# Patient Record
Sex: Male | Born: 1960 | Race: White | Hispanic: No | Marital: Single | Smoking: Current every day smoker
Health system: Southern US, Community
[De-identification: ages and names within clinical notes are randomized; demographics above are authoritative.]

## PROBLEM LIST (undated history)

## (undated) ENCOUNTER — Emergency Department (HOSPITAL_COMMUNITY): Admission: EM | Source: Home / Self Care

## (undated) DIAGNOSIS — E119 Type 2 diabetes mellitus without complications: Secondary | ICD-10-CM

## (undated) DIAGNOSIS — F419 Anxiety disorder, unspecified: Secondary | ICD-10-CM

## (undated) DIAGNOSIS — M869 Osteomyelitis, unspecified: Secondary | ICD-10-CM

## (undated) DIAGNOSIS — I639 Cerebral infarction, unspecified: Secondary | ICD-10-CM

## (undated) DIAGNOSIS — K7460 Unspecified cirrhosis of liver: Secondary | ICD-10-CM

## (undated) DIAGNOSIS — I10 Essential (primary) hypertension: Secondary | ICD-10-CM

## (undated) DIAGNOSIS — I480 Paroxysmal atrial fibrillation: Secondary | ICD-10-CM

## (undated) DIAGNOSIS — F319 Bipolar disorder, unspecified: Secondary | ICD-10-CM

## (undated) HISTORY — PX: BELOW KNEE LEG AMPUTATION: SUR23

## (undated) HISTORY — DX: Osteomyelitis, unspecified: M86.9

---

## 1998-03-06 ENCOUNTER — Emergency Department (HOSPITAL_COMMUNITY): Admission: EM | Admit: 1998-03-06 | Discharge: 1998-03-06 | Payer: Self-pay | Admitting: Emergency Medicine

## 1999-06-27 ENCOUNTER — Emergency Department (HOSPITAL_COMMUNITY): Admission: EM | Admit: 1999-06-27 | Discharge: 1999-06-27 | Payer: Self-pay | Admitting: Emergency Medicine

## 1999-06-27 ENCOUNTER — Encounter: Payer: Self-pay | Admitting: Emergency Medicine

## 2018-05-07 ENCOUNTER — Emergency Department (HOSPITAL_COMMUNITY): Payer: Medicare Other

## 2018-05-07 ENCOUNTER — Inpatient Hospital Stay (HOSPITAL_COMMUNITY): Payer: Medicare Other

## 2018-05-07 ENCOUNTER — Encounter (HOSPITAL_COMMUNITY): Payer: Self-pay | Admitting: Emergency Medicine

## 2018-05-07 ENCOUNTER — Inpatient Hospital Stay (HOSPITAL_COMMUNITY)
Admission: EM | Admit: 2018-05-07 | Discharge: 2018-05-10 | DRG: 565 | Disposition: A | Discharge status: Skilled Nursing Facility | Payer: Medicare Other | Attending: Family Medicine | Admitting: Family Medicine

## 2018-05-07 ENCOUNTER — Other Ambulatory Visit: Payer: Self-pay

## 2018-05-07 DIAGNOSIS — L97519 Non-pressure chronic ulcer of other part of right foot with unspecified severity: Secondary | ICD-10-CM | POA: Diagnosis not present

## 2018-05-07 DIAGNOSIS — L89899 Pressure ulcer of other site, unspecified stage: Secondary | ICD-10-CM | POA: Diagnosis present

## 2018-05-07 DIAGNOSIS — L03115 Cellulitis of right lower limb: Secondary | ICD-10-CM | POA: Diagnosis present

## 2018-05-07 DIAGNOSIS — T8789 Other complications of amputation stump: Secondary | ICD-10-CM | POA: Diagnosis present

## 2018-05-07 DIAGNOSIS — Z23 Encounter for immunization: Secondary | ICD-10-CM | POA: Diagnosis present

## 2018-05-07 DIAGNOSIS — Z794 Long term (current) use of insulin: Secondary | ICD-10-CM

## 2018-05-07 DIAGNOSIS — D696 Thrombocytopenia, unspecified: Secondary | ICD-10-CM | POA: Diagnosis present

## 2018-05-07 DIAGNOSIS — F319 Bipolar disorder, unspecified: Secondary | ICD-10-CM | POA: Diagnosis present

## 2018-05-07 DIAGNOSIS — I10 Essential (primary) hypertension: Secondary | ICD-10-CM | POA: Diagnosis not present

## 2018-05-07 DIAGNOSIS — L97909 Non-pressure chronic ulcer of unspecified part of unspecified lower leg with unspecified severity: Secondary | ICD-10-CM | POA: Diagnosis not present

## 2018-05-07 DIAGNOSIS — F419 Anxiety disorder, unspecified: Secondary | ICD-10-CM | POA: Diagnosis present

## 2018-05-07 DIAGNOSIS — T8743 Infection of amputation stump, right lower extremity: Principal | ICD-10-CM | POA: Diagnosis present

## 2018-05-07 DIAGNOSIS — M86161 Other acute osteomyelitis, right tibia and fibula: Secondary | ICD-10-CM | POA: Diagnosis not present

## 2018-05-07 DIAGNOSIS — Z8249 Family history of ischemic heart disease and other diseases of the circulatory system: Secondary | ICD-10-CM | POA: Diagnosis not present

## 2018-05-07 DIAGNOSIS — E119 Type 2 diabetes mellitus without complications: Secondary | ICD-10-CM | POA: Diagnosis present

## 2018-05-07 DIAGNOSIS — L0390 Cellulitis, unspecified: Secondary | ICD-10-CM | POA: Diagnosis not present

## 2018-05-07 DIAGNOSIS — T148XXA Other injury of unspecified body region, initial encounter: Secondary | ICD-10-CM | POA: Diagnosis not present

## 2018-05-07 DIAGNOSIS — E1165 Type 2 diabetes mellitus with hyperglycemia: Secondary | ICD-10-CM | POA: Diagnosis present

## 2018-05-07 DIAGNOSIS — E10621 Type 1 diabetes mellitus with foot ulcer: Secondary | ICD-10-CM | POA: Diagnosis not present

## 2018-05-07 DIAGNOSIS — Z5900 Homelessness unspecified: Secondary | ICD-10-CM

## 2018-05-07 DIAGNOSIS — E1140 Type 2 diabetes mellitus with diabetic neuropathy, unspecified: Secondary | ICD-10-CM | POA: Diagnosis present

## 2018-05-07 DIAGNOSIS — T8740 Infection of amputation stump, unspecified extremity: Secondary | ICD-10-CM | POA: Diagnosis not present

## 2018-05-07 DIAGNOSIS — I110 Hypertensive heart disease with heart failure: Secondary | ICD-10-CM | POA: Diagnosis present

## 2018-05-07 DIAGNOSIS — M86172 Other acute osteomyelitis, left ankle and foot: Secondary | ICD-10-CM | POA: Diagnosis present

## 2018-05-07 DIAGNOSIS — I5030 Unspecified diastolic (congestive) heart failure: Secondary | ICD-10-CM | POA: Diagnosis not present

## 2018-05-07 DIAGNOSIS — Z833 Family history of diabetes mellitus: Secondary | ICD-10-CM | POA: Diagnosis not present

## 2018-05-07 DIAGNOSIS — E118 Type 2 diabetes mellitus with unspecified complications: Secondary | ICD-10-CM | POA: Diagnosis not present

## 2018-05-07 DIAGNOSIS — L089 Local infection of the skin and subcutaneous tissue, unspecified: Secondary | ICD-10-CM | POA: Diagnosis present

## 2018-05-07 DIAGNOSIS — Z79899 Other long term (current) drug therapy: Secondary | ICD-10-CM | POA: Diagnosis not present

## 2018-05-07 DIAGNOSIS — Y835 Amputation of limb(s) as the cause of abnormal reaction of the patient, or of later complication, without mention of misadventure at the time of the procedure: Secondary | ICD-10-CM | POA: Diagnosis present

## 2018-05-07 DIAGNOSIS — I509 Heart failure, unspecified: Secondary | ICD-10-CM

## 2018-05-07 DIAGNOSIS — E11622 Type 2 diabetes mellitus with other skin ulcer: Secondary | ICD-10-CM | POA: Diagnosis not present

## 2018-05-07 DIAGNOSIS — F17200 Nicotine dependence, unspecified, uncomplicated: Secondary | ICD-10-CM | POA: Diagnosis present

## 2018-05-07 DIAGNOSIS — I5032 Chronic diastolic (congestive) heart failure: Secondary | ICD-10-CM | POA: Diagnosis present

## 2018-05-07 HISTORY — DX: Anxiety disorder, unspecified: F41.9

## 2018-05-07 HISTORY — DX: Bipolar disorder, unspecified: F31.9

## 2018-05-07 HISTORY — DX: Essential (primary) hypertension: I10

## 2018-05-07 HISTORY — DX: Type 2 diabetes mellitus without complications: E11.9

## 2018-05-07 LAB — BASIC METABOLIC PANEL
Anion gap: 11 (ref 5–15)
BUN: 10 mg/dL (ref 6–20)
CHLORIDE: 108 mmol/L (ref 98–111)
CO2: 21 mmol/L — ABNORMAL LOW (ref 22–32)
CREATININE: 0.87 mg/dL (ref 0.61–1.24)
Calcium: 9.2 mg/dL (ref 8.9–10.3)
Glucose, Bld: 131 mg/dL — ABNORMAL HIGH (ref 70–99)
Potassium: 3.7 mmol/L (ref 3.5–5.1)
SODIUM: 140 mmol/L (ref 135–145)

## 2018-05-07 LAB — CBC WITH DIFFERENTIAL/PLATELET
Basophils Absolute: 0 10*3/uL (ref 0.0–0.1)
Basophils Relative: 0 %
EOS ABS: 0.5 10*3/uL (ref 0.0–0.7)
EOS PCT: 5 %
HCT: 37.5 % — ABNORMAL LOW (ref 39.0–52.0)
HEMOGLOBIN: 13 g/dL (ref 13.0–17.0)
LYMPHS ABS: 2.8 10*3/uL (ref 0.7–4.0)
Lymphocytes Relative: 26 %
MCH: 32.7 pg (ref 26.0–34.0)
MCHC: 34.7 g/dL (ref 30.0–36.0)
MCV: 94.5 fL (ref 78.0–100.0)
Monocytes Absolute: 0.8 10*3/uL (ref 0.1–1.0)
Monocytes Relative: 8 %
NEUTROS PCT: 61 %
Neutro Abs: 6.5 10*3/uL (ref 1.7–7.7)
PLATELETS: 141 10*3/uL — AB (ref 150–400)
RBC: 3.97 MIL/uL — AB (ref 4.22–5.81)
RDW: 14.1 % (ref 11.5–15.5)
WBC: 10.7 10*3/uL — AB (ref 4.0–10.5)

## 2018-05-07 LAB — I-STAT CG4 LACTIC ACID, ED: LACTIC ACID, VENOUS: 1.44 mmol/L (ref 0.5–1.9)

## 2018-05-07 LAB — C-REACTIVE PROTEIN

## 2018-05-07 LAB — CBG MONITORING, ED: GLUCOSE-CAPILLARY: 118 mg/dL — AB (ref 70–99)

## 2018-05-07 LAB — SEDIMENTATION RATE: Sed Rate: 16 mm/hr (ref 0–16)

## 2018-05-07 MED ORDER — HYDROCODONE-ACETAMINOPHEN 5-325 MG PO TABS
1.0000 | ORAL_TABLET | Freq: Once | ORAL | Status: AC
  Administered 2018-05-07: 1 via ORAL
  Filled 2018-05-07: qty 1

## 2018-05-07 MED ORDER — PIPERACILLIN-TAZOBACTAM 3.375 G IVPB 30 MIN
3.3750 g | Freq: Once | INTRAVENOUS | Status: AC
  Administered 2018-05-07: 3.375 g via INTRAVENOUS
  Filled 2018-05-07: qty 50

## 2018-05-07 MED ORDER — VANCOMYCIN HCL 10 G IV SOLR
2500.0000 mg | Freq: Once | INTRAVENOUS | Status: AC
  Administered 2018-05-07: 2500 mg via INTRAVENOUS
  Filled 2018-05-07: qty 2000

## 2018-05-07 MED ORDER — GADOBUTROL 1 MMOL/ML IV SOLN
10.0000 mL | Freq: Once | INTRAVENOUS | Status: AC | PRN
  Administered 2018-05-07: 10 mL via INTRAVENOUS

## 2018-05-07 NOTE — ED Notes (Signed)
[facility] dispatch notified for need of transport.  

## 2018-05-07 NOTE — Progress Notes (Signed)
ED TO INPATIENT HANDOFF REPORT  Name/Age/Gender Cory Palmer 57 y.o. male  Code Status   Home/SNF/Other Home  Chief Complaint leg pain   Level of Care/Admitting Diagnosis ED Disposition    ED Disposition Condition [facility] Area: [place] [[id number]]  Level of Care: Med-Surg [16]  Diagnosis: Wound infection [591638]  Admitting Physician: Toy Baker [[id number]]  Attending Physician: Toy Baker [[id number]]  Estimated length of stay: 3 - 4 days  Certification:: I certify this patient will need inpatient services for at least 2 midnights  PT Class (Do Not Modify): Inpatient [101]  PT Acc Code (Do Not Modify): Private [1]       Medical History Past Medical History:  Diagnosis Date  . Anxiety   . CHF (congestive heart failure) ([facility])   . Depressed bipolar disorder ([facility])   . Diabetes mellitus without complication ([place])     Allergies No Known Allergies  IV Location/Drains/Wounds Patient Lines/Drains/Airways Status   Active Line/Drains/Airways    None          Labs/Imaging Results for orders placed or performed during the hospital encounter of 05/07/18 (from the past 48 hour(s))  Basic metabolic panel     Status: Abnormal   Collection Time: 05/07/18  8:27 PM  Result Value Ref Range   Sodium 140 135 - 145 mmol/L   Potassium 3.7 3.5 - 5.1 mmol/L   Chloride 108 98 - 111 mmol/L   CO2 21 (L) 22 - 32 mmol/L   Glucose, Bld 131 (H) 70 - 99 mg/dL   BUN 10 6 - 20 mg/dL   Creatinine, Ser 0.87 0.61 - 1.24 mg/dL   Calcium 9.2 8.9 - 10.3 mg/dL   GFR calc non Af Amer >60 >60 mL/min   GFR calc Af Amer >60 >60 mL/min    Comment: (NOTE) The eGFR has been calculated using the CKD EPI equation. This calculation has not been validated in all clinical situations. eGFR's persistently <60 mL/min signify possible Chronic Kidney Disease.    Anion gap 11 5 - 15    Comment: Performed at [facility], [place] [address], [place], [place] [address]  CBC with Differential     Status: Abnormal   Collection Time: 05/07/18  8:27 PM  Result Value Ref Range   WBC 10.7 (H) 4.0 - 10.5 K/uL   RBC 3.97 (L) 4.22 - 5.81 MIL/uL   Hemoglobin 13.0 13.0 - 17.0 g/dL   HCT 37.5 (L) 39.0 - 52.0 %   MCV 94.5 78.0 - 100.0 fL   MCH 32.7 26.0 - 34.0 pg   MCHC 34.7 30.0 - 36.0 g/dL   RDW 14.1 11.5 - 15.5 %   Platelets 141 (L) 150 - 400 K/uL   Neutrophils Relative % 61 %   Neutro Abs 6.5 1.7 - 7.7 K/uL   Lymphocytes Relative 26 %   Lymphs Abs 2.8 0.7 - 4.0 K/uL   Monocytes Relative 8 %   Monocytes Absolute 0.8 0.1 - 1.0 K/uL   Eosinophils Relative 5 %   Eosinophils Absolute 0.5 0.0 - 0.7 K/uL   Basophils Relative 0 %   Basophils Absolute 0.0 0.0 - 0.1 K/uL    Comment: Performed at [facility], [place] [address] [place], [place] [address]  Sedimentation rate     Status: None   Collection Time: 05/07/18  8:27 PM  Result Value Ref Range   Sed Rate 16 0 - 16 mm/hr  Comment: Performed at [facility], [place] [address] [place], [place] [address]  C-reactive protein     Status: None   Collection Time: 05/07/18  8:27 PM  Result Value Ref Range   CRP <0.8 <1.0 mg/dL    Comment: REPEATED TO VERIFY Performed at [facility], [place] [address] [place], [place] [address]   I-Stat CG4 Lactic Acid, ED     Status: None   Collection Time: 05/07/18  8:36 PM  Result Value Ref Range   Lactic Acid, Venous 1.44 0.5 - 1.9 mmol/L  CBG monitoring, ED     Status: Abnormal   Collection Time: 05/07/18  9:42 PM  Result Value Ref Range   Glucose-Capillary 118 (H) 70 - 99 mg/dL   Dg Tibia/fibula Right  Result Date: 05/07/2018 CLINICAL DATA:  Pt bib EMS and presents with right leg pain at the sight of his amputation. Amputation was done about 1.5 years ago. Pt reports pain has been presents for a "few months." Pt is homeless and reports his leg has been "sweating" a lot. Pt a/o x 4. EXAM:  RIGHT TIBIA AND FIBULA - 2 VIEW COMPARISON:  None. FINDINGS: Amputated margin of the remaining proximal tibial diaphysis is somewhat ill-defined. Lateral margin of the amputated fibular diaphysis also appears somewhat ill-defined. Consider osteomyelitis in the proper clinical setting. No soft tissue air. No acute fracture.  No bone lesion.  Knee joint is normally aligned. IMPRESSION: 1. Somewhat ill-defined margins of the amputated proximal tibial and fibular diaphyses. This may be a chronic postsurgical appearance. Osteomyelitis should be considered in the proper clinical setting. Electronically Signed   By: Lajean Manes M.D.   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 EXAM: MRI OF LOWER RIGHT EXTREMITY WITHOUT AND WITH CONTRAST TECHNIQUE: Multiplanar, multisequence MR imaging of the right lower extremity was performed both before and after administration of intravenous contrast. CONTRAST:  10 mL Gadavist COMPARISON:  None. FINDINGS: Bones/Joint/Cartilage Abnormal marrow signal abnormality involving the tip of the tibial stump status post right above knee amputation with ill-defined cortical margins at its tip. The area of marrow signal abnormality extends more proximally for at least 2.6 cm. Findings are concerning for osteomyelitis given overlying subcutaneous soft tissue edema and trace enhancing soft tissue fluid associated with a cutaneous ulcer along the tip of the stump. No marrow signal abnormality of the distal fibula Subchondral cyst of the lateral tibial plateau deep to the lateral tibial spine. Trace joint effusion noted of the included knee. Small popliteal cyst is identified. Ligaments Noncontributory Muscles and Tendons Atrophic appearance of the remaining calf musculature. Soft tissues Diffuse soft tissue edema  involving the stump of the above knee amputation. Subtle enhancing serpiginous fluid in the soft tissues, series 11/35 for example that may represent small non drainable abscesses. IMPRESSION: Marrow signal abnormality/edema of the tibial amputated margin with subtle cortical bone loss associated with soft tissue edema, tiny cutaneous ulceration and thin enhancing serpiginous non drainable fluid collections would be in keeping with changes of acute osteomyelitis. Electronically Signed   By: Ashley Royalty M.D.   On: 05/07/2018 22:56    Pending Labs Unresulted Labs (From admission, onward)    Start     Ordered   05/07/18 2240  MRSA PCR Screening  Once,  R    Question:  Patient immune status  Answer:  Normal   05/07/18 2239   05/07/18 2135  Urine rapid drug screen (hosp performed)  STAT,   R     05/07/18 2134   Signed and Held  CBC with Differential  Tomorrow morning,   R     Signed and Held   Signed and Held  Hemoglobin A1c  Tomorrow morning,   R     Signed and Held   Signed and Held  HIV antibody  Tomorrow morning,   R     Signed and Held   Signed and Held  Prealbumin  Tomorrow morning,   R     Signed and Held   Signed and Held  Blood Cultures x 2 sites  BLOOD CULTURE X 2,   R    Question:  Patient immune status  Answer:  Normal   Signed and Held   Signed and Held  MRSA PCR Screening  Once,   R    Question:  Patient immune status  Answer:  Normal   Signed and Held   Signed and Held  Hemoglobin A1c  Tomorrow morning,   R    Comments:  To assess prior glycemic control    Signed and Held          Vitals/Pain Today's Vitals   05/07/18 1854 05/07/18 2014 05/07/18 2017 05/07/18 2258  BP: (!) 109/94   (!) 118/48  Pulse: 65   (!) 57  Resp: 18   18  Temp: 98.7 F (37.1 C)     TempSrc: Oral     SpO2: 100%   96%  Weight:   117.9 kg   Height:   _0  (1.93 m)   PainSc:  4       Isolation Precautions No active isolations  Medications Medications  piperacillin-tazobactam (ZOSYN)  IVPB 3.375 g (has no administration in time range)  vancomycin (VANCOCIN) 2,500 mg in sodium chloride 0.9 % 500 mL IVPB (has no administration in time range)  HYDROcodone-acetaminophen (NORCO/VICODIN) 5-325 MG per tablet 1 tablet (1 tablet Oral Given 05/07/18 2015)  gadobutrol (GADAVIST) 1 MMOL/ML injection 10 mL (10 mLs Intravenous Contrast Given 05/07/18 2235)    Mobility walks with device

## 2018-05-07 NOTE — ED Triage Notes (Signed)
Pt bib EMS and presents with right leg pain at the sight of his amputation.  Amputation was done about 1.5 years ago.  Pt reports pain has been presents for a "few months."  Pt is homeless and reports his leg has been "sweating" a lot. Pt a/o x 4.

## 2018-05-07 NOTE — Progress Notes (Signed)
A consult was received from an ED physician for Vancomycin per pharmacy dosing.  The patient's profile has been reviewed for ht/wt/allergies/indication/available labs.    A one time order has been placed for Vancomycin 2500mg  IV.  Further antibiotics/pharmacy consults should be ordered by admitting physician if indicated.                       Thank you, Jamse Mead 05/07/2018  9:57 PM

## 2018-05-07 NOTE — ED Notes (Signed)
Bed: WA10 Expected date:  Expected time:  Means of arrival:  Comments: Triage 7 

## 2018-05-07 NOTE — ED Provider Notes (Signed)
[facility]-EMERGENCY DEPT Provider Note   CSN: [id number] Arrival date & time: 05/07/18  1846     History   Chief Complaint Chief Complaint  Patient presents with  . Leg Pain    Right    HPI Cory Palmer is a 57 y.o. male.  HPI  58 year old male with a history of bipolar disorder and diabetes with a prior right BKA presents with concern for right leg infection and pain.  He states that he had to have a revision about 6 months ago due to infection.  All of this prior care has been done in [facility].  He states that he has not had any fever but had a little bit of nausea today and overall not feeling well.  For about a month he is been having pain at the stump site as well as drainage.  The drainage is blood mixed with what he thinks is water.  His prosthesis is soaked at the end of the day.  He has not taken anything for pain.  Pain is rated as severe.  Past Medical History:  Diagnosis Date  . Anxiety   . CHF (congestive heart failure) ([facility])   . Depressed bipolar disorder ([facility])   . Diabetes mellitus without complication [facility])     Patient Active Problem List   Diagnosis Date Noted  . Wound infection 05/07/2018          Home Medications    Prior to Admission medications   Not on File    Family History No family history on file.  Social History Social History   Tobacco Use  . Smoking status: Not on file  Substance Use Topics  . Alcohol use: Not on file  . Drug use: Not on file     Allergies   Patient has no known allergies.   Review of Systems Review of Systems  Constitutional: Negative for fever.  Gastrointestinal: Positive for nausea. Negative for vomiting.  Musculoskeletal: Positive for arthralgias.  Skin: Positive for wound.  All other systems reviewed and are negative.    Physical Exam Updated Vital Signs BP (!) 109/94 (BP Location: Right Arm)   Pulse 65   Temp 98.7 F (37.1 C) (Oral)    Resp 18   Ht 6\' 4"  (1.93 m)   Wt 117.9 kg   SpO2 100%   BMI 31.65 kg/m   Physical Exam  Constitutional: He appears well-developed and well-nourished. No distress.  HENT:  Head: Normocephalic and atraumatic.  Right Ear: External ear normal.  Left Ear: External ear normal.  Nose: Nose normal.  Eyes: Right eye exhibits no discharge. Left eye exhibits no discharge.  Neck: Neck supple.  Pulmonary/Chest: Effort normal.  Abdominal: He exhibits no distension.  Musculoskeletal: He exhibits no edema.       Right lower leg: He exhibits tenderness and swelling.       Legs: Neurological: He is alert.  Skin: Skin is warm and dry. He is not diaphoretic.  Psychiatric: His mood appears not anxious.  Nursing note and vitals reviewed.    ED Treatments / Results  Labs (all labs ordered are listed, but only abnormal results are displayed) Labs Reviewed  BASIC METABOLIC PANEL - Abnormal; Notable for the following components:      Result Value   CO2 21 (*)    Glucose, Bld 131 (*)    All other components within normal limits  CBC WITH DIFFERENTIAL/PLATELET - Abnormal; Notable for the following  components:   WBC 10.7 (*)    RBC 3.97 (*)    HCT 37.5 (*)    Platelets 141 (*)    All other components within normal limits  SEDIMENTATION RATE  C-REACTIVE PROTEIN  RAPID URINE DRUG SCREEN, HOSP PERFORMED  I-STAT CG4 LACTIC ACID, ED  CBG MONITORING, ED  I-STAT CG4 LACTIC ACID, ED    EKG None  Radiology Dg Tibia/fibula Right  Result Date: 05/07/2018 CLINICAL DATA:  Pt bib EMS and presents with right leg pain at the sight of his amputation. Amputation was done about 1.5 years ago. Pt reports pain has been presents for a "few months." Pt is homeless and reports his leg has been "sweating" a lot. Pt a/o x 4. EXAM: RIGHT TIBIA AND FIBULA - 2 VIEW COMPARISON:  None. FINDINGS: Amputated margin of the remaining proximal tibial diaphysis is somewhat ill-defined. Lateral margin of the amputated fibular  diaphysis also appears somewhat ill-defined. Consider osteomyelitis in the proper clinical setting. No soft tissue air. No acute fracture.  No bone lesion.  Knee joint is normally aligned. IMPRESSION: 1. Somewhat ill-defined margins of the amputated proximal tibial and fibular diaphyses. This may be a chronic postsurgical appearance. Osteomyelitis should be considered in the proper clinical setting. Electronically Signed   By: Amie Portland M.D.   On: 05/07/2018 20:23    Procedures Procedures (including critical care time)  Medications Ordered in ED Medications  piperacillin-tazobactam (ZOSYN) IVPB 3.375 g (has no administration in time range)  HYDROcodone-acetaminophen (NORCO/VICODIN) 5-325 MG per tablet 1 tablet (1 tablet Oral Given 05/07/18 2015)     Initial Impression / Assessment and Plan / ED Course  I have reviewed the triage vital signs and the nursing notes.  Pertinent labs & imaging results that were available during my care of the patient were reviewed by me and considered in my medical decision making (see chart for details).     Given patient's history of diabetes, homelessness, and likely poor follow-up with what is concerning for an amputation abscess, I think he will need admission.  He will need antibiotics.  I discussed with Dr. Charlann Boxer, who request patient be transferred to [facility] for Dr. [name] to evaluate tomorrow.  Would like an MRI as well.  I will start him on antibiotics given his prolonged course and worsening symptoms with now systemic symptoms and elevated WBC.  Dr. Adela Glimpse to admit.  Final Clinical Impressions(s) / ED Diagnoses   Final diagnoses:  Amputation stump infection [facility])    ED Discharge Orders    None       Pricilla Loveless, MD 05/07/18 2150

## 2018-05-07 NOTE — H&P (Signed)
Cory Palmer ZOX:[id number] DOB: 1961-02-22 DOA: 05/07/2018     PCP: Patient, [name] Per   Outpatient Specialists:   NONE    Patient arrived to ER on 05/07/18 at 1846  Patient coming from: homeless Chief Complaint:  Chief Complaint  Patient presents with  . Leg Pain    Right    HPI: Cory Palmer is a 56 y.o. male with medical history significant of BKA, bipolar disorder, diabetes mellitus, CHF    Presented with 1 month of right leg pain and drainage from the wound.  Patient status post right BKA by 1.5 years ago he is homeless has not been able to follow-up he has been having worsening drainage and pain from that leg.  Is been happening for past few months Has not taken anything for pain. Patient recently moved to [place] where he has family prior to this did not obtain any care in the area Regarding pertinent Chronic problems: History of BKA requiring prior revisions in [place] secondary to infection History of CHF but no echogram in the system States he takes metoprolol for valvular disease Patient is diabetic on insulin has not had any access to refrigeration Reports past history of drug abuse but not currently  While in ER: Pain imaging showing possible osteomyelitis  PDX has been consulted and recommends admission to [facility] The following Work up has been ordered so far:  Orders Placed This Encounter  Procedures  . DG Tibia/Fibula Right  . Basic metabolic panel  . CBC with Differential  . Sedimentation rate  . C-reactive protein  . Diet NPO time specified  . Consult to orthopedic surgery  . Consult to hospitalist  . CBG monitoring, ED  . I-Stat CG4 Lactic Acid, ED  . Saline lock IV     Following Medications were ordered in ER: Medications  HYDROcodone-acetaminophen (NORCO/VICODIN) 5-325 MG per tablet 1 tablet (1 tablet Oral Given 05/07/18 2015)    Significant initial  Findings: Abnormal Labs Reviewed  BASIC METABOLIC PANEL - Abnormal;  Notable for the following components:      Result Value   CO2 21 (*)    Glucose, Bld 131 (*)    All other components within normal limits  CBC WITH DIFFERENTIAL/PLATELET - Abnormal; Notable for the following components:   WBC 10.7 (*)    RBC 3.97 (*)    HCT 37.5 (*)    Platelets 141 (*)    All other components within normal limits   Lactic acid 1.44  Na 140 K 3.7  Cr    stable,    Lab Results  Component Value Date   CREATININE 0.87 05/07/2018    WBC  10.7  HG/HCT stable,       Component Value Date/Time   HGB 13.0 05/07/2018 2027   HCT 37.5 (L) 05/07/2018 2027    Lactic Acid, Venous    Component Value Date/Time   LATICACIDVEN 1.44 05/07/2018 2036     UA    not ordered   Right fibular tibial cannot rule out osteomyelitis  ECG:   Ordered Personally reviewed by me showing: HR : 53 Rhythm:   Sinus brady   no evidence of ischemic changes QTC 497      ED Triage Vitals  Enc Vitals Group     BP 05/07/18 1854 (!) 109/94     Pulse Rate 05/07/18 1854 65     Resp 05/07/18 1854 18     Temp 05/07/18 1854 98.7 F (37.1  C)     Temp Source 05/07/18 1854 Oral     SpO2 05/07/18 1854 100 %     Weight 05/07/18 2017 260 lb (117.9 kg)     Height 05/07/18 2017 6\' 4"  (1.93 m)     Head Circumference --      Peak Flow --      Pain Score 05/07/18 2014 4     Pain Loc --      Pain Edu? --      Excl. in GC? --   TMAX(24)@       Latest  Blood pressure (!) 109/94, pulse 65, temperature 98.7 F (37.1 C), temperature source Oral, resp. rate 18, height 6\' 4"  (1.93 m), weight 117.9 kg, SpO2 100 %.    ER Provider Called: Orthopedics     Dr.Olin They Recommend admit to [facility], Will see in AM   Hospitalist was called for admission for right BKA infected wound   Review of Systems:    Pertinent positives include: chills, fatigue, leg pain and drainage  Constitutional:  No weight loss, night sweats, Fevers, weight loss  HEENT:  No headaches, Difficulty swallowing,Tooth/dental  problems,Sore throat,  No sneezing, itching, ear ache, nasal congestion, post nasal drip,  Cardio-vascular:  No chest pain, Orthopnea, PND, anasarca, dizziness, palpitations.no Bilateral lower extremity swelling  GI:  No heartburn, indigestion, abdominal pain, nausea, vomiting, diarrhea, change in bowel habits, loss of appetite, melena, blood in stool, hematemesis Resp:  no shortness of breath at rest. No dyspnea on exertion, No excess mucus, no productive cough, No non-productive cough, No coughing up of blood.No change in color of mucus. No wheezing. Skin:  no rash or lesions. No jaundice GU:  no dysuria, change in color of urine, no urgency or frequency. No straining to urinate.  No flank pain.  Musculoskeletal:   . No decreased range of motion. No back pain.  Psych:  No change in mood or affect. No depression or anxiety. No memory loss.  Neuro: no localizing neurological complaints, no tingling, no weakness, no double vision, no gait abnormality, no slurred speech, no confusion  All systems reviewed and apart from HOPI all are negative  Past Medical History:   Past Medical History:  Diagnosis Date  . Anxiety   . CHF (congestive heart failure) (HCC)   . Depressed bipolar disorder (HCC)   . Diabetes mellitus without complication [facility])       Past Surgical History:  Procedure Laterality Date  . BELOW KNEE LEG AMPUTATION Right     Social History:  Ambulatory  Cane,      has no tobacco, alcohol, and drug history on file.     Family History:   Family History  Problem Relation Age of Onset  . Hypertension Other   . Diabetes Mellitus II Other     Allergies: No Known Allergies   Prior to Admission medications   Not on File   Physical Exam: Blood pressure (!) 109/94, pulse 65, temperature 98.7 F (37.1 C), temperature source Oral, resp. rate 18, height 6\' 4"  (1.93 m), weight 117.9 kg, SpO2 100 %. 1. General:  in No Acute distress  Chronically ill     -appearing 2. Psychological: Alert and   Oriented 3. Head/ENT:     Dry Mucous Membranes                          Head Non traumatic, neck supple  Poor Dentition 4. SKIN:   decreased Skin turgor,  Skin clean Dry irritation of her right lower extremity BKA with draining ulcer at the bottom foul-smelling discharge    5. Heart: Regular rate and rhythm no  Murmur, no Rub or gallop 6. Lungs:  Clear to auscultation bilaterally, no wheezes or crackles   7. Abdomen: Soft,  non-tender, Non distended   obese  bowel sounds present 8. Lower extremities: no clubbing, cyanosis, or edema status post right BKA 9. Neurologically Grossly intact, moving all 4 extremities equally   10. MSK: Normal range of motion   LABS:     Recent Labs  Lab 05/07/18 2027  WBC 10.7*  NEUTROABS 6.5  HGB 13.0  HCT 37.5*  MCV 94.5  PLT 141*   Basic Metabolic Panel: Recent Labs  Lab 05/07/18 2027  NA 140  K 3.7  CL 108  CO2 21*  GLUCOSE 131*  BUN 10  CREATININE 0.87  CALCIUM 9.2      No results for input(s): AST, ALT, ALKPHOS, BILITOT, PROT, ALBUMIN in the last 168 hours. No results for input(s): LIPASE, AMYLASE in the last 168 hours. No results for input(s): AMMONIA in the last 168 hours.    HbA1C: No results for input(s): HGBA1C in the last 72 hours. CBG: No results for input(s): GLUCAP in the last 168 hours.    Urine analysis: No results found for: COLORURINE, APPEARANCEUR, LABSPEC, PHURINE, GLUCOSEU, HGBUR, BILIRUBINUR, KETONESUR, PROTEINUR, UROBILINOGEN, NITRITE, LEUKOCYTESUR     Cultures: No results found for: SDES, SPECREQUEST, CULT, REPTSTATUS   Radiological Exams on Admission: Dg Tibia/fibula Right  Result Date: 05/07/2018 CLINICAL DATA:  Pt bib EMS and presents with right leg pain at the sight of his amputation. Amputation was done about 1.5 years ago. Pt reports pain has been presents for a "few months." Pt is homeless and reports his leg has been  "sweating" a lot. Pt a/o x 4. EXAM: RIGHT TIBIA AND FIBULA - 2 VIEW COMPARISON:  None. FINDINGS: Amputated margin of the remaining proximal tibial diaphysis is somewhat ill-defined. Lateral margin of the amputated fibular diaphysis also appears somewhat ill-defined. Consider osteomyelitis in the proper clinical setting. No soft tissue air. No acute fracture.  No bone lesion.  Knee joint is normally aligned. IMPRESSION: 1. Somewhat ill-defined margins of the amputated proximal tibial and fibular diaphyses. This may be a chronic postsurgical appearance. Osteomyelitis should be considered in the proper clinical setting. Electronically Signed   By: Amie Portland M.D.   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 EXAM: MRI OF LOWER RIGHT EXTREMITY WITHOUT AND WITH CONTRAST TECHNIQUE: Multiplanar, multisequence MR imaging of the right lower extremity was performed both before and after administration of intravenous contrast. CONTRAST:  10 mL Gadavist COMPARISON:  None. FINDINGS: Bones/Joint/Cartilage Abnormal marrow signal abnormality involving the tip of the tibial stump status post right above knee amputation with ill-defined cortical margins at its tip. The area of marrow signal abnormality extends more proximally for at least 2.6 cm. Findings are concerning for osteomyelitis given overlying subcutaneous soft tissue edema and trace enhancing soft tissue fluid associated with a cutaneous ulcer along the tip of the stump. No marrow signal abnormality of the distal fibula Subchondral cyst of the lateral tibial plateau deep to the lateral tibial spine. Trace joint effusion  noted of the included knee. Small popliteal cyst is identified. Ligaments Noncontributory Muscles and Tendons Atrophic appearance of the remaining calf musculature. Soft  tissues Diffuse soft tissue edema involving the stump of the above knee amputation. Subtle enhancing serpiginous fluid in the soft tissues, series 11/35 for example that may represent small non drainable abscesses. IMPRESSION: Marrow signal abnormality/edema of the tibial amputated margin with subtle cortical bone loss associated with soft tissue edema, tiny cutaneous ulceration and thin enhancing serpiginous non drainable fluid collections would be in keeping with changes of acute osteomyelitis. Electronically Signed   By: Tollie Eth M.D.   On: 05/07/2018 22:56    Chart has been reviewed    Assessment/Plan  57 y.o. male with medical history significant of BKA, bipolar disorder, diabetes mellitus, CHF     Admitted for  right BKA infected wound  Present on Admission: . Wound infection/acute osteomyelitis of lower extremity- -admit perlower extremity wound infection protocol will      continue current antibiotic choice ceftriaxone metronidazole he was given 1 dose of Zosyn and bank in emergency department      plain films showed:   no evidence of air unclear if evidence of osteomyelitis     foreign   Objects MRI of lower extremity ordered showing acute osteomyelitis      Will obtain MRSA screening,       obtain blood cultures       further antibiotic adjustment pending above results Orthopedics has been consulted -requesting admission to [facility] will see in a.m. keep n.p.o. if may need surgical intervention . DM (diabetes mellitus), type 2 with complications (HCC)-  - Order Sensitive  SSI   - continue home insulin regimen    Lantus  5 units,  -  check TSH and HgA1C    Homelessness to social services Tobacco abuse -  - Spoke about importance of quitting spent 5 minutes discussing options for treatment, prior attempts at quitting, and dangers of smoking  -At this point patient is NOT  interested in quitting  - order nicotine patch   - nursing tobacco cessation protocol  HX of CHF  -obtain echogram as there is no echogram in the system continue home medications for now currently appears to be euvolemic  HTN -somewhat soft blood pressures and bradycardia will hold metoprolol for tonight unsure if patient has been truly compliant restart when able   Other plan as per orders.  DVT prophylaxis:  SCD    Code Status:  FULL CODE   as per patient  I had personally discussed CODE STATUS with patient      Family Communication:   Family not  at  Bedside Patient states he has children who live in [place] but he is  unable to get a hold of them  Disposition Plan:    likely will need placement for rehabilitation                    Would benefit from PT/OT eval prior to DC  Ordered                                      Social Work  consulted                   Nutrition    consulted  Consults called: Orthopedics   Admission status:  inpatient     Expect 2 midnight stay secondary to severity of patient's current illness including    Severe radiological abnormalities including evidence of osteomyelitis and extensive comorbidities including:  substance abuse   DM2    CHF     That are currently affecting medical management.  I expect  patient to be hospitalized for 2 midnights requiring inpatient medical care.  Patient is at high risk for adverse outcome (such as loss of life or disability) if not treated.  Indication for inpatient stay as follows:    severe pain requiring acute inpatient management,    Need for operative intervention Need for IV antibiotics, IV fluids,      Level of care      medical floor             Cory Palmer 05/07/2018, 11:50 PM     [facility]  Pager [phone]   after 2 AM please page floor coverage PA If 7AM-7PM, please contact the day team taking care of the patient  [email or web address]  Password [email or web address]

## 2018-05-08 ENCOUNTER — Inpatient Hospital Stay (HOSPITAL_COMMUNITY): Payer: Medicare Other

## 2018-05-08 DIAGNOSIS — I5030 Unspecified diastolic (congestive) heart failure: Secondary | ICD-10-CM

## 2018-05-08 DIAGNOSIS — L0390 Cellulitis, unspecified: Secondary | ICD-10-CM

## 2018-05-08 DIAGNOSIS — T8743 Infection of amputation stump, right lower extremity: Principal | ICD-10-CM

## 2018-05-08 LAB — TSH: TSH: 3.27 u[IU]/mL (ref 0.350–4.500)

## 2018-05-08 LAB — CBC WITH DIFFERENTIAL/PLATELET
ABS IMMATURE GRANULOCYTES: 0 10*3/uL (ref 0.00–0.07)
BASOS PCT: 1 %
Basophils Absolute: 0.1 10*3/uL (ref 0.0–0.1)
Eosinophils Absolute: 0.5 10*3/uL (ref 0.0–0.5)
Eosinophils Relative: 5 %
HEMATOCRIT: 36.7 % — AB (ref 39.0–52.0)
Hemoglobin: 12.4 g/dL — ABNORMAL LOW (ref 13.0–17.0)
IMMATURE GRANULOCYTES: 0 %
LYMPHS ABS: 2.7 10*3/uL (ref 0.7–4.0)
Lymphocytes Relative: 29 %
MCH: 32.5 pg (ref 26.0–34.0)
MCHC: 33.8 g/dL (ref 30.0–36.0)
MCV: 96.3 fL (ref 80.0–100.0)
MONOS PCT: 10 %
Monocytes Absolute: 0.9 10*3/uL (ref 0.1–1.0)
NEUTROS ABS: 5.2 10*3/uL (ref 1.7–7.7)
NEUTROS PCT: 55 %
PLATELETS: 115 10*3/uL — AB (ref 150–400)
RBC: 3.81 MIL/uL — AB (ref 4.22–5.81)
RDW: 13.9 % (ref 11.5–15.5)
WBC: 9.4 10*3/uL (ref 4.0–10.5)

## 2018-05-08 LAB — COMPREHENSIVE METABOLIC PANEL
ALBUMIN: 3.5 g/dL (ref 3.5–5.0)
ALT: 18 U/L (ref 0–44)
AST: 32 U/L (ref 15–41)
Alkaline Phosphatase: 101 U/L (ref 38–126)
Anion gap: 10 (ref 5–15)
BUN: 11 mg/dL (ref 6–20)
CO2: 21 mmol/L — AB (ref 22–32)
CREATININE: 0.87 mg/dL (ref 0.61–1.24)
Calcium: 8.7 mg/dL — ABNORMAL LOW (ref 8.9–10.3)
Chloride: 107 mmol/L (ref 98–111)
GFR calc Af Amer: >60 mL/min (ref >60)
GFR calc non Af Amer: >60 mL/min (ref >60)
Glucose, Bld: 109 mg/dL — ABNORMAL HIGH (ref 70–99)
Potassium: 3.6 mmol/L (ref 3.5–5.1)
SODIUM: 138 mmol/L (ref 135–145)
Total Bilirubin: 2.9 mg/dL — ABNORMAL HIGH (ref 0.3–1.2)
Total Protein: 6.4 g/dL — ABNORMAL LOW (ref 6.5–8.1)

## 2018-05-08 LAB — ECHOCARDIOGRAM COMPLETE
Height: 76 in
Weight: 4160 oz

## 2018-05-08 LAB — GLUCOSE, CAPILLARY
GLUCOSE-CAPILLARY: 102 mg/dL — AB (ref 70–99)
Glucose-Capillary: 109 mg/dL — ABNORMAL HIGH (ref 70–99)
Glucose-Capillary: 106 mg/dL — ABNORMAL HIGH (ref 70–99)
Glucose-Capillary: 183 mg/dL — ABNORMAL HIGH (ref 70–99)
Glucose-Capillary: 126 mg/dL — ABNORMAL HIGH (ref 70–99)
Glucose-Capillary: 164 mg/dL — ABNORMAL HIGH (ref 70–99)
Glucose-Capillary: 109 mg/dL — ABNORMAL HIGH (ref 70–99)

## 2018-05-08 LAB — PREALBUMIN: PREALBUMIN: 11.2 mg/dL — AB (ref 18–38)

## 2018-05-08 LAB — MRSA PCR SCREENING: MRSA BY PCR: NEGATIVE

## 2018-05-08 LAB — HEMOGLOBIN A1C
Hgb A1c MFr Bld: 7.5 % — ABNORMAL HIGH (ref 4.8–5.6)
MEAN PLASMA GLUCOSE: 168.55 mg/dL

## 2018-05-08 LAB — HIV ANTIBODY (ROUTINE TESTING W REFLEX): HIV SCREEN 4TH GENERATION: NONREACTIVE

## 2018-05-08 LAB — MAGNESIUM: Magnesium: 1.6 mg/dL — ABNORMAL LOW (ref 1.7–2.4)

## 2018-05-08 LAB — PHOSPHORUS: Phosphorus: 3.1 mg/dL (ref 2.5–4.6)

## 2018-05-08 MED ORDER — PRO-STAT SUGAR FREE PO LIQD
30.0000 mL | Freq: Two times a day (BID) | ORAL | Status: DC
  Administered 2018-05-08 – 2018-05-09 (×3): 30 mL via ORAL
  Filled 2018-05-08 (×3): qty 30

## 2018-05-08 MED ORDER — INFLUENZA VAC SPLIT QUAD 0.5 ML IM SUSY
0.5000 mL | PREFILLED_SYRINGE | INTRAMUSCULAR | Status: AC
  Administered 2018-05-10: 0.5 mL via INTRAMUSCULAR
  Filled 2018-05-08: qty 0.5

## 2018-05-08 MED ORDER — ADULT MULTIVITAMIN W/MINERALS CH
1.0000 | ORAL_TABLET | Freq: Every day | ORAL | Status: DC
  Administered 2018-05-08 – 2018-05-10 (×3): 1 via ORAL
  Filled 2018-05-08 (×3): qty 1

## 2018-05-08 MED ORDER — GLUCERNA SHAKE PO LIQD
237.0000 mL | Freq: Two times a day (BID) | ORAL | Status: DC
  Administered 2018-05-08 – 2018-05-10 (×4): 237 mL via ORAL

## 2018-05-08 MED ORDER — METRONIDAZOLE IN NACL 5-0.79 MG/ML-% IV SOLN
500.0000 mg | Freq: Three times a day (TID) | INTRAVENOUS | Status: DC
  Administered 2018-05-08 – 2018-05-10 (×7): 500 mg via INTRAVENOUS
  Filled 2018-05-08 (×8): qty 100

## 2018-05-08 MED ORDER — INSULIN ASPART 100 UNIT/ML ~~LOC~~ SOLN
0.0000 [IU] | Freq: Three times a day (TID) | SUBCUTANEOUS | Status: DC
  Administered 2018-05-09 – 2018-05-10 (×3): 1 [IU] via SUBCUTANEOUS

## 2018-05-08 MED ORDER — ONDANSETRON HCL 4 MG PO TABS: 4.0000 mg | ORAL_TABLET | Freq: Four times a day (QID) | ORAL | Status: DC | PRN

## 2018-05-08 MED ORDER — INSULIN GLARGINE 100 UNIT/ML ~~LOC~~ SOLN
5.0000 [IU] | Freq: Every day | SUBCUTANEOUS | Status: DC
  Administered 2018-05-08: 5 [IU] via SUBCUTANEOUS
  Filled 2018-05-08: qty 0.05

## 2018-05-08 MED ORDER — TRAZODONE HCL 50 MG PO TABS
50.0000 mg | ORAL_TABLET | Freq: Every day | ORAL | Status: DC
  Administered 2018-05-08 – 2018-05-09 (×3): 50 mg via ORAL
  Filled 2018-05-08 (×3): qty 1

## 2018-05-08 MED ORDER — SODIUM CHLORIDE 0.9 % IV SOLN
2.0000 g | Freq: Every day | INTRAVENOUS | Status: DC
  Administered 2018-05-08 – 2018-05-09 (×3): 2 g via INTRAVENOUS
  Filled 2018-05-08 (×4): qty 20

## 2018-05-08 MED ORDER — METFORMIN HCL 500 MG PO TABS
1000.0000 mg | ORAL_TABLET | Freq: Two times a day (BID) | ORAL | Status: DC
  Administered 2018-05-08 – 2018-05-10 (×4): 1000 mg via ORAL
  Filled 2018-05-08 (×4): qty 2

## 2018-05-08 MED ORDER — POLYETHYLENE GLYCOL 3350 17 G PO PACK
17.0000 g | PACK | Freq: Every day | ORAL | Status: DC | PRN
  Administered 2018-05-10: 17 g via ORAL
  Filled 2018-05-08: qty 1

## 2018-05-08 MED ORDER — ACETAMINOPHEN 325 MG PO TABS
650.0000 mg | ORAL_TABLET | Freq: Four times a day (QID) | ORAL | Status: DC | PRN
  Administered 2018-05-09: 650 mg via ORAL
  Filled 2018-05-08: qty 2

## 2018-05-08 MED ORDER — QUETIAPINE FUMARATE 400 MG PO TABS
400.0000 mg | ORAL_TABLET | Freq: Every day | ORAL | Status: DC
  Administered 2018-05-08 – 2018-05-09 (×3): 400 mg via ORAL
  Filled 2018-05-08 (×3): qty 1

## 2018-05-08 MED ORDER — SODIUM CHLORIDE 0.9 % IV SOLN
INTRAVENOUS | Status: AC
  Administered 2018-05-08: 02:00:00 via INTRAVENOUS

## 2018-05-08 MED ORDER — MAGNESIUM SULFATE 4 GM/100ML IV SOLN
4.0000 g | Freq: Once | INTRAVENOUS | Status: AC
  Administered 2018-05-08: 4 g via INTRAVENOUS
  Filled 2018-05-08: qty 100

## 2018-05-08 MED ORDER — ACETAMINOPHEN 650 MG RE SUPP: 650.0000 mg | Freq: Four times a day (QID) | RECTAL | Status: DC | PRN

## 2018-05-08 MED ORDER — ONDANSETRON HCL 4 MG/2ML IJ SOLN: 4.0000 mg | Freq: Four times a day (QID) | INTRAMUSCULAR | Status: DC | PRN

## 2018-05-08 MED ORDER — HYDROCODONE-ACETAMINOPHEN 5-325 MG PO TABS: 1.0000 | ORAL_TABLET | ORAL | Status: DC | PRN

## 2018-05-08 MED ORDER — NICOTINE 21 MG/24HR TD PT24
21.0000 mg | MEDICATED_PATCH | Freq: Every day | TRANSDERMAL | Status: DC
  Administered 2018-05-08 – 2018-05-10 (×3): 21 mg via TRANSDERMAL
  Filled 2018-05-08 (×3): qty 1

## 2018-05-08 MED ORDER — INSULIN ASPART 100 UNIT/ML ~~LOC~~ SOLN
0.0000 [IU] | SUBCUTANEOUS | Status: DC
  Administered 2018-05-08: 1 [IU] via SUBCUTANEOUS
  Administered 2018-05-08 (×2): 2 [IU] via SUBCUTANEOUS

## 2018-05-08 NOTE — Progress Notes (Signed)
  Echocardiogram 2D Echocardiogram has been performed.  Cory Palmer 05/08/2018, 2:07 PM

## 2018-05-08 NOTE — Progress Notes (Signed)
VASCULAR LAB PRELIMINARY  ARTERIAL  ABI completed:    RIGHT    LEFT    PRESSURE WAVEFORM  PRESSURE WAVEFORM  BRACHIAL 115 tripahsic BRACHIAL 122 triphasic  DP  unable to obtain due to foot amputation DP 144 triphasic         PT  unable to obtain due to foot amputation PT 148 triphasic                  RIGHT LEFT  ABI  1.21   Left ABI is within normal range.   Leander Rams 05/08/2018, 12:47 PM

## 2018-05-08 NOTE — Consult Note (Signed)
ORTHOPAEDIC CONSULTATION  REQUESTING PHYSICIAN: Darlin Drop, DO  Chief Complaint: Ulceration right transtibial amputation.  HPI: Cory Palmer is a 57 y.o. male who presents with a poor fitting prosthesis on the right.  Patient has been end bearing on his residual limb developing pressure ulcers over the right transtibial amputation.  Past Medical History:  Diagnosis Date  . Anxiety   . CHF (congestive heart failure) (HCC)   . Depressed bipolar disorder (HCC)   . Diabetes mellitus without complication (HCC)   . Hypertension    Past Surgical History:  Procedure Laterality Date  . BELOW KNEE LEG AMPUTATION Right    Social History   Socioeconomic History  . Marital status: Single    Spouse name: Not on file  . Number of children: Not on file  . Years of education: Not on file  . Highest education level: Not on file  Occupational History  . Not on file  Social Needs  . Financial resource strain: Patient refused  . Food insecurity:    Worry: Patient refused    Inability: Patient refused  . Transportation needs:    Medical: Patient refused    Non-medical: Patient refused  Tobacco Use  . Smoking status: Current Every Day Smoker  . Smokeless tobacco: Never Used  Substance and Sexual Activity  . Alcohol use: Yes    Comment: occasional  . Drug use: Not Currently  . Sexual activity: Not on file  Lifestyle  . Physical activity:    Days per week: Patient refused    Minutes per session: Patient refused  . Stress: Not on file  Relationships  . Social connections:    Talks on phone: Not on file    Gets together: Not on file    Attends religious service: Not on file    Active member of club or organization: Not on file    Attends meetings of clubs or organizations: Not on file    Relationship status: Not on file  Other Topics Concern  . Not on file  Social History Narrative  . Not on file   Family History  Problem Relation Age of Onset  . Hypertension Other    . Diabetes Mellitus II Other    - negative except otherwise stated in the family history section No Known Allergies Prior to Admission medications   Medication Sig Start Date End Date Taking? Authorizing Provider  docusate sodium (COLACE) 100 MG capsule Take 100 mg by mouth daily.   Yes [provider]  Insulin Aspart (NOVOLOG FLEXPEN [place]) Inject 3-5 Units into the skin 3 (three) times daily with meals.   Yes [provider]  Insulin Glargine (LANTUS) 100 UNIT/ML Solostar Pen Inject 5 Units into the skin daily.   Yes [provider]  metoprolol tartrate (LOPRESSOR) 25 MG tablet Take 25 mg by mouth 2 (two) times daily.   Yes [provider]  QUEtiapine (SEROQUEL) 400 MG tablet Take 400 mg by mouth at bedtime.   Yes [provider]  traZODone (DESYREL) 50 MG tablet Take 50 mg by mouth at bedtime.   Yes [provider]   Dg Tibia/fibula Right  Result Date: 05/07/2018 CLINICAL DATA:  Pt bib EMS and presents with right leg pain at the sight of his amputation. Amputation was done about 1.5 years ago. Pt reports pain has been presents for a "few months." Pt is homeless and reports his leg has been "sweating" a lot. Pt a/o x 4. EXAM: RIGHT  TIBIA AND FIBULA - 2 VIEW COMPARISON:  None. FINDINGS: Amputated margin of the remaining proximal tibial diaphysis is somewhat ill-defined. Lateral margin of the amputated fibular diaphysis also appears somewhat ill-defined. Consider osteomyelitis in the proper clinical setting. No soft tissue air. No acute fracture.  No bone lesion.  Knee joint is normally aligned. IMPRESSION: 1. Somewhat ill-defined margins of the amputated proximal tibial and fibular diaphyses. This may be a chronic postsurgical appearance. Osteomyelitis should be considered in the proper clinical setting. Electronically Signed   By: Amie Portland M.D.   On: 05/07/2018 20:23   Mr Tibia Fibula Right W Wo Contrast  Result Date: 05/07/2018 CLINICAL  DATA:  Status post above knee amputation 1.5 years ago with pain and bloody discharge now noted from the stump for the past month. EXAM: MRI OF LOWER RIGHT EXTREMITY WITHOUT AND WITH CONTRAST TECHNIQUE: Multiplanar, multisequence MR imaging of the right lower extremity was performed both before and after administration of intravenous contrast. CONTRAST:  10 mL Gadavist COMPARISON:  None. FINDINGS: Bones/Joint/Cartilage Abnormal marrow signal abnormality involving the tip of the tibial stump status post right above knee amputation with ill-defined cortical margins at its tip. The area of marrow signal abnormality extends more proximally for at least 2.6 cm. Findings are concerning for osteomyelitis given overlying subcutaneous soft tissue edema and trace enhancing soft tissue fluid associated with a cutaneous ulcer along the tip of the stump. No marrow signal abnormality of the distal fibula Subchondral cyst of the lateral tibial plateau deep to the lateral tibial spine. Trace joint effusion noted of the included knee. Small popliteal cyst is identified. Ligaments Noncontributory Muscles and Tendons Atrophic appearance of the remaining calf musculature. Soft tissues Diffuse soft tissue edema involving the stump of the above knee amputation. Subtle enhancing serpiginous fluid in the soft tissues, series 11/35 for example that may represent small non drainable abscesses. IMPRESSION: Marrow signal abnormality/edema of the tibial amputated margin with subtle cortical bone loss associated with soft tissue edema, tiny cutaneous ulceration and thin enhancing serpiginous non drainable fluid collections would be in keeping with changes of acute osteomyelitis. Electronically Signed   By: Tollie Eth M.D.   On: 05/07/2018 22:56   - pertinent xrays, CT, MRI studies were reviewed and independently interpreted  Positive ROS: All other systems have been reviewed and were otherwise negative with the exception of those mentioned  in the HPI and as above.  Physical Exam: General: Alert, no acute distress Psychiatric: Patient is competent for consent with normal mood and affect Lymphatic: No axillary or cervical lymphadenopathy Cardiovascular: No pedal edema Respiratory: No cyanosis, no use of accessory musculature GI: No organomegaly, abdomen is soft and non-tender    Images:  @ENCIMAGES @  Labs:  Lab Results  Component Value Date   HGBA1C 7.5 (H) 05/08/2018   ESRSEDRATE 16 05/07/2018   CRP <0.8 05/07/2018    Lab Results  Component Value Date   ALBUMIN 3.5 05/08/2018   PREALBUMIN 11.2 (L) 05/08/2018    Neurologic: Patient does not have protective sensation bilateral lower extremities.   MUSCULOSKELETAL:   Skin: Examination patient has 2 superficial ulcers about 10 mm in diameter 0.1 mm deep on the residual limb on the right.  There is no depth there is no drainage no cellulitis no abscess no tenderness to palpation.  Ulceration secondary to subsidence in the socket.  Review of the x-ray does show some edema in the tibia which with the clinical presentation this appears to be edema from N  bearing pressure and not from osteomyelitis.  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  Plan: Plan: I will contact [facility] and have them apply a stump shrinker and start the fabrication process for a new prosthesis.  I will follow-up in the office in 1 week.  Patient should minimize his use of the prosthesis until in the wound was fabricated.  Thank you for the consult and the opportunity to see Mr. Malek Skog, MD [facility] [phone] 7:15 AM

## 2018-05-08 NOTE — Progress Notes (Signed)
MD- Please consider changing Novolog SSI to TID AC + HS   (currently has solid PO diet ordered)  MD- If patient goes to a SNF, I would keep him on his insulin regimen.  However, if pt discharged back to the community, he will likely be living outside.  It probably would be more realistic and safer to have patient take oral DM meds instead.  Perhaps we could switch him to Metformin (and Amaryl if 2nd agent needed?)   Met with patient today.  Sleeps outside several nights and has been sleeping at a motel as well.  Told me he has some insulin in his bags ("several tubes of two different insulins").  Admits to not taking insulin on a regular basis.  Wanted to know if he could get an IT trainer wheelchair.  Discussed with pt that I am concerned that he does not have a refrigerator to store his insulin.  Discussed with pt that unopened insulin needs to be stored in the fridge and opened insulin needs to be kept at room temperature.  Given patient has been living outside with these extreme temperatures over the last several weeks, I doubt his insulin is still safe to use.      --Will follow patient during hospitalization--  Wyn Quaker RN, MSN, CDE Diabetes Coordinator Inpatient Glycemic Control Team Team Pager: [phone] (8a-5p)

## 2018-05-08 NOTE — Progress Notes (Addendum)
PROGRESS NOTE  Cory Palmer ZOX:[id number] DOB: 01-Jan-1961 DOA: 05/07/2018 PCP: Patient, [name] Per  HPI/Recap of past 24 hours: Cory Palmer is a 57 y.o. male with medical history significant of R BKA, bipolar disorder, diabetes mellitus, CHF.  Presented with 1 month of right leg pain and drainage from the wound. Patient status post right BKA by 1.5 years ago he is homeless has not been able to follow-up he has been having worsening drainage and pain from that leg.  Is been happening for past few months. Has not taken anything for pain. Patient recently moved to [place] where he has family prior to this did not obtain any care in the area Regarding pertinent Chronic problems: History of R BKA requiring prior revisions in [place] secondary to infection History of CHF but no echogram in the system States he takes metoprolol for valvular disease Patient is diabetic on insulin has not had any access to refrigeration Reports past history of drug abuse but not currently  05/08/18: Patient seen and examined at his bedside.  Reports significant pain in his stump at the level of his wound.  Patient is homeless however he refuses SNF.  Assessment/Plan: Active Problems:   Wound infection   Homelessness   DM (diabetes mellitus), type 2 with complications (HCC)   Acute osteomyelitis involving lower leg, right (HCC)  Wound infection/acute osteomyelitis of lower extremity Continue ceftriaxone and IV metronidazole  empirically Orthopedic surgery consulted and following MRI of lower extremity ordered showing acute osteomyelitis MRSA screening negative Blood cultures x2 in process  Type 2 diabetes uncontrolled with hyperglycemia A1c 7.5 on 05/08/2018: Start metformin 1000 mg twice daily Continue to monitor blood sugar    Homelessness  Social worker consulted and following  Tobacco abuse Tobacco cessation counseling done at bedside  HX of CHF  2D echo done today 05/08/2018  essentially unremarkable  HTN Blood pressure stable    DVT prophylaxis:   Subcu Lovenox daily   Code Status:  FULL CODE     Family Communication: None at bedside  Disposition Plan: Home when clinically stable   Consultants:  Orthopedic surgery  Procedures:  None  Antimicrobials:  IV Rocephin and IV Flagyl empirically  DVT prophylaxis: Subcu Lovenox daily   Objective: Vitals:   05/08/18 0047 05/08/18 0424 05/08/18 1007 05/08/18 1428  BP: 124/68 (!) 97/49 (!) 98/58 114/60  Pulse:  (!) 59 (!) 58 (!) 53  Resp: 18 16 15    Temp:  98.1 F (36.7 C) 97.8 F (36.6 C) (!) 97.4 F (36.3 C)  TempSrc:  Oral Oral Oral  SpO2: 97% 94% 95% 95%  Weight:      Height:        Intake/Output Summary (Last 24 hours) at 05/08/2018 1712 Last data filed at 05/08/2018 8119 Gross per 24 hour  Intake 41.11 ml  Output -  Net 41.11 ml   Filed Weights   05/07/18 2017  Weight: 117.9 kg    Exam:  . General: 57 y.o. year-old male well developed well nourished in no acute distress.  Alert and oriented x3. . Cardiovascular: Regular rate and rhythm with no rubs or gallops.  No thyromegaly or JVD noted.   Marland Kitchen Respiratory: Clear to auscultation with no wheezes or rales. Good inspiratory effort. . Abdomen: Soft nontender nondistended with normal bowel sounds x4 quadrants. . Musculoskeletal: Right below the knee amputation with wound on his stump. Marland Kitchen Psychiatry: Mood is appropriate for condition and setting   Data Reviewed: CBC:  Recent Labs  Lab 05/07/18 2027 05/08/18 0228  WBC 10.7* 9.4  NEUTROABS 6.5 5.2  HGB 13.0 12.4*  HCT 37.5* 36.7*  MCV 94.5 96.3  PLT 141* 115*   Basic Metabolic Panel: Recent Labs  Lab 05/07/18 2027 05/08/18 0228  NA 140 138  K 3.7 3.6  CL 108 107  CO2 21* 21*  GLUCOSE 131* 109*  BUN 10 11  CREATININE 0.87 0.87  CALCIUM 9.2 8.7*  MG  --  1.6*  PHOS  --  3.1   GFR: Estimated Creatinine Clearance: 131.4 mL/min (by C-G formula based on SCr of  0.87 mg/dL). Liver Function Tests: Recent Labs  Lab 05/08/18 0228  AST 32  ALT 18  ALKPHOS 101  BILITOT 2.9*  PROT 6.4*  ALBUMIN 3.5   No results for input(s): LIPASE, AMYLASE in the last 168 hours. No results for input(s): AMMONIA in the last 168 hours. Coagulation Profile: No results for input(s): INR, PROTIME in the last 168 hours. Cardiac Enzymes: No results for input(s): CKTOTAL, CKMB, CKMBINDEX, TROPONINI in the last 168 hours. BNP (last 3 results) No results for input(s): PROBNP in the last 8760 hours. HbA1C: Recent Labs    05/08/18 0228  HGBA1C 7.5*   CBG: Recent Labs  Lab 05/08/18 0427 05/08/18 0434 05/08/18 0833 05/08/18 1206 05/08/18 1645  GLUCAP 102* 106* 183* 126* 164*   Lipid Profile: No results for input(s): CHOL, HDL, LDLCALC, TRIG, CHOLHDL, LDLDIRECT in the last 72 hours. Thyroid Function Tests: Recent Labs    05/08/18 0228  TSH 3.270   Anemia Panel: No results for input(s): VITAMINB12, FOLATE, FERRITIN, TIBC, IRON, RETICCTPCT in the last 72 hours. Urine analysis: No results found for: COLORURINE, APPEARANCEUR, LABSPEC, PHURINE, GLUCOSEU, HGBUR, BILIRUBINUR, KETONESUR, PROTEINUR, UROBILINOGEN, NITRITE, LEUKOCYTESUR Sepsis Labs: @LABRCNTIP (procalcitonin:4,lacticidven:4)  ) Recent Results (from the past 240 hour(s))  MRSA PCR Screening     Status: None   Collection Time: 05/07/18 11:46 PM  Result Value Ref Range Status   MRSA by PCR NEGATIVE NEGATIVE Final    Comment:        The GeneXpert MRSA Assay (FDA approved for NASAL specimens only), is one component of a comprehensive MRSA colonization surveillance program. It is not intended to diagnose MRSA infection nor to guide or monitor treatment for MRSA infections. Performed at [facility], [address] [place], [place] [address]       Studies: Dg Tibia/fibula Right  Result Date: 05/07/2018 CLINICAL DATA:  Pt bib EMS and presents with right leg pain at the  sight of his amputation. Amputation was done about 1.5 years ago. Pt reports pain has been presents for a "few months." Pt is homeless and reports his leg has been "sweating" a lot. Pt a/o x 4. EXAM: RIGHT TIBIA AND FIBULA - 2 VIEW COMPARISON:  None. FINDINGS: Amputated margin of the remaining proximal tibial diaphysis is somewhat ill-defined. Lateral margin of the amputated fibular diaphysis also appears somewhat ill-defined. Consider osteomyelitis in the proper clinical setting. No soft tissue air. No acute fracture.  No bone lesion.  Knee joint is normally aligned. IMPRESSION: 1. Somewhat ill-defined margins of the amputated proximal tibial and fibular diaphyses. This may be a chronic postsurgical appearance. Osteomyelitis should be considered in the proper clinical setting. Electronically Signed   By: Amie Portland M.D.   On: 05/07/2018 20:23   Mr Tibia Fibula Right W Wo Contrast  Result Date: 05/07/2018 CLINICAL DATA:  Status post above knee amputation 1.5 years ago with pain  and bloody discharge now noted from the stump for the past month. EXAM: MRI OF LOWER RIGHT EXTREMITY WITHOUT AND WITH CONTRAST TECHNIQUE: Multiplanar, multisequence MR imaging of the right lower extremity was performed both before and after administration of intravenous contrast. CONTRAST:  10 mL Gadavist COMPARISON:  None. FINDINGS: Bones/Joint/Cartilage Abnormal marrow signal abnormality involving the tip of the tibial stump status post right above knee amputation with ill-defined cortical margins at its tip. The area of marrow signal abnormality extends more proximally for at least 2.6 cm. Findings are concerning for osteomyelitis given overlying subcutaneous soft tissue edema and trace enhancing soft tissue fluid associated with a cutaneous ulcer along the tip of the stump. No marrow signal abnormality of the distal fibula Subchondral cyst of the lateral tibial plateau deep to the lateral tibial spine. Trace joint effusion noted of  the included knee. Small popliteal cyst is identified. Ligaments Noncontributory Muscles and Tendons Atrophic appearance of the remaining calf musculature. Soft tissues Diffuse soft tissue edema involving the stump of the above knee amputation. Subtle enhancing serpiginous fluid in the soft tissues, series 11/35 for example that may represent small non drainable abscesses. IMPRESSION: Marrow signal abnormality/edema of the tibial amputated margin with subtle cortical bone loss associated with soft tissue edema, tiny cutaneous ulceration and thin enhancing serpiginous non drainable fluid collections would be in keeping with changes of acute osteomyelitis. Electronically Signed   By: Tollie Eth M.D.   On: 05/07/2018 22:56   Dg Chest Port 1 View  Result Date: 05/08/2018 CLINICAL DATA:  CHF EXAM: PORTABLE CHEST 1 VIEW COMPARISON:  None. FINDINGS: The lungs are well-expanded. There is no focal infiltrate or pleural effusion. The heart is mildly enlarged. The central pulmonary vascularity is prominent. The mediastinum is normal in width. The bony thorax exhibits no acute abnormality. IMPRESSION: Mild cardiomegaly and pulmonary vascular congestion is consistent with low-grade CHF. No alveolar pneumonia. Electronically Signed   By: David  [place] M.D.   On: 05/08/2018 09:24    Scheduled Meds: . feeding supplement (GLUCERNA SHAKE)  237 mL Oral BID BM  . feeding supplement (PRO-STAT SUGAR FREE 64)  30 mL Oral BID  . [START ON 05/09/2018] Influenza vac split quadrivalent PF  0.5 mL Intramuscular Tomorrow-1000  . insulin aspart  0-9 Units Subcutaneous Q4H  . metFORMIN  1,000 mg Oral BID WC  . multivitamin with minerals  1 tablet Oral Daily  . nicotine  21 mg Transdermal Daily  . QUEtiapine  400 mg Oral QHS  . traZODone  50 mg Oral QHS    Continuous Infusions: . cefTRIAXone (ROCEPHIN)  IV 2 g (05/08/18 0151)   And  . metronidazole 500 mg (05/08/18 1432)     LOS: 1 day     Darlin Drop, MD [facility] Pager [phone]  If 7PM-7AM, please contact night-coverage [email or web address] Password [email or web address] 05/08/2018, 5:12 PM

## 2018-05-08 NOTE — Progress Notes (Signed)
Orthopedic Tech Progress Note Patient Details:  Cory Palmer [birth year] [id number]  Patient ID: [name], male   DOB: Sep 07, 1960, 57 y.o.   MRN: [id number]   Nikki Dom 05/08/2018, 8:53 AM Called in hanger brace order; spoke with Thayer Ohm

## 2018-05-08 NOTE — Progress Notes (Signed)
Inpatient Diabetes Program Recommendations  AACE/ADA: New Consensus Statement on Inpatient Glycemic Control (2015)  Target Ranges:  Prepandial:   less than 140 mg/dL      Peak postprandial:   less than 180 mg/dL (1-2 hours)      Critically ill patients:  140 - 180 mg/dL   Results for Cory Palmer, Cory Palmer (MRN [id number]) as of 05/08/2018 10:29  Ref. Range 05/07/2018 21:42 05/08/2018 02:01 05/08/2018 04:27 05/08/2018 04:34 05/08/2018 08:33  Glucose-Capillary Latest Ref Range: 70 - 99 mg/dL 409 (H) 811 (H) 914 (H) 106 (H) 183 (H)  2 units NOVOLOG +   5 units LANTUS   Results for Cory Palmer, Cory Palmer (MRN [id number]) as of 05/08/2018 10:29  Ref. 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  CBGs prior to Insulin starting were actually OK.  Not sure if patient is actually taking insulin outside the hospital given his homeless issues.    If patient hasn't been taking insulin consistently outside the hospital, he may be able to switch to oral DM meds given his A1c is OK at 7.5%.       --Will follow patient during hospitalization--  Ambrose Finland RN, MSN, CDE Diabetes Coordinator Inpatient Glycemic Control Team Team Pager: [phone] (8a-5p)

## 2018-05-08 NOTE — Plan of Care (Signed)
  Problem: Safety: Goal: Ability to remain free from injury will improve Outcome: Progressing   

## 2018-05-08 NOTE — Consult Note (Signed)
WOC Nurse wound consult note Patient evaluated in [facility].  No family present Reason for Consult: Wounds on right stump Wound type: Traumatic from ill fitting prosthesis. POA: Yes Measurements: The most anterior wound measures 1.3 cm x 0.8 cm x no measurable depth.  Wound bed pink, no odor, no induration.  The wound that is more posterior measures 1.8 cm x 4.0 cm is 100% pink, no odor, no induration.  Both were without dressings at the time of my assessment. Dressing procedure/placement/frequency: Foam dressings to both sites, change every 3 days.  These were placed at the conclusion of my assessment. Monitor the wound area(s) for worsening of condition such as: Signs/symptoms of infection,  Increase in size,  Development of or worsening of odor, Development of pain, or increased pain at the affected locations.  Notify the medical team if any of these develop.  Thank you for the consult.  Discussed plan of care with the patient and bedside nurse.  WOC nurse will not follow at this time.  Please re-consult the WOC team if needed.  Helmut Muster, RN, MSN, CWOCN, CNS-BC, pager [phone]

## 2018-05-08 NOTE — Progress Notes (Signed)
CSW consulted with patient regarding SNF recommendation, patient reports he has been to several SNFs and will not go back. Patient reports he will go home. CSW inquird where this was, patient reports he is homeless. Patient inquired about other people he knows who have houses and assistance. CSW inquired if patient had tried to receive assistance in the past, he reported no. CSW gave patient packet of resources and encouraged him to call to see eligibility while he is in the hospital and able to via phone provided. Patient said he would.   Due to patient refusing SNF, he will most likely be discharged back into the community at this time. He has a list of resources and shelters if he chooses to go to one, and he also has family in the area if he chose to explore the possibility of staying with them, however he reports he is not exploring that at this time.   [place], [place] [phone]

## 2018-05-08 NOTE — Evaluation (Signed)
Physical Therapy Evaluation Patient Details Name: Cory Palmer MRN: [id number] DOB: 1961-05-10 Today's Date: 05/08/2018   History of Present Illness  Cory Palmer is a 57 y.o. male who presents with a poor fitting prosthesis on the right.  Patient has been end bearing on his residual limb developing pressure ulcers over the right transtibial amputation. PMH: CHF, DM, HTN, biploar, anxiety  Clinical Impression  Pt admitted with above diagnosis. Pt currently with functional limitations due to the deficits listed below (see PT Problem List). Pt is to be limiting time in prosthesis per Dr Audrie Lia note due to ill fit and wound and no new prosthesis fabricated at this point. Pt has not been mobilizing without his prosthesis and is weak UE and LE and needs mod A +2 for sit to stand. Pt could not hop effectively on LLE to ambulate or even pivot all the way to chair. Chair brought up behind pt after a few inches of motion and pt sat. He is currently unable to mobilize independently or care for self and has been homeless PTA. Ideal situation for him would be SNF.  Pt will benefit from skilled PT to increase their independence and safety with mobility to allow discharge to the venue listed below.       Follow Up Recommendations SNF;Supervision/Assistance - 24 hour    Equipment Recommendations  None recommended by PT    Recommendations for Other Services       Precautions / Restrictions Precautions Precautions: Fall Restrictions Weight Bearing Restrictions: Yes Other Position/Activity Restrictions: per Dr Audrie Lia note: Limit use of R prosthesis until new prosthesis is fabricated      Mobility  Bed Mobility Overal bed mobility: Needs Assistance Bed Mobility: Supine to Sit     Supine to sit: Min assist     General bed mobility comments: pt able to use rail to get to EOB, min A given at R hip  Transfers Overall transfer level: Needs assistance Equipment used: Rolling walker (2  wheeled) Transfers: Sit to/from [employer] Sit to Stand: Mod assist;+2 physical assistance Stand pivot transfers: Mod assist;+2 physical assistance       General transfer comment: mod A +2 for fwd wt shift and power up. Pt has difficulty taking full wt through UE's and hopping to L to pivot. Was able to turn ~45 degrees and then chair had to be brought behind him as he was too weak to keep going.   Ambulation/Gait             General Gait Details: Currently unable to ambulate without use of prosthesis  Stairs            Wheelchair Mobility    Modified Rankin (Stroke Patients Only)       Balance Overall balance assessment: Needs assistance Sitting-balance support: No upper extremity supported Sitting balance-Leahy Scale: Good     Standing balance support: Bilateral upper extremity supported Standing balance-Leahy Scale: Poor Standing balance comment: unable to stand without UE support and mod A                             Pertinent Vitals/Pain Pain Assessment: Faces Faces Pain Scale: Hurts little more Pain Location: RLE Pain Descriptors / Indicators: Constant Pain Intervention(s): Limited activity within patient's tolerance    Home Living Family/patient expects to be discharged to:: Unsure  Additional Comments: pt was homeless PTA    Prior Function Level of Independence: Independent with assistive device(s)         Comments: pt has been independent but has not been able to care for self well, reports he has been living on the ground.      Hand Dominance        Extremity/Trunk Assessment   Upper Extremity Assessment Upper Extremity Assessment: Generalized weakness    Lower Extremity Assessment Lower Extremity Assessment: RLE deficits/detail;Generalized weakness RLE Deficits / Details: hip flex 2+/5, R hamstrings somewhat tight though pt can achieve full ROM.  RLE Sensation: decreased light  touch RLE Coordination: WNL    Cervical / Trunk Assessment Cervical / Trunk Assessment: Normal  Communication   Communication: No difficulties  Cognition Arousal/Alertness: Awake/alert Behavior During Therapy: WFL for tasks assessed/performed Overall Cognitive Status: No family/caregiver present to determine baseline cognitive functioning                                 General Comments: difficult to follow his story of surgeries, rehab, etc. Possibly some STM deficits.       General Comments General comments (skin integrity, edema, etc.): pt reports he was in rehab in [place], [place] and had to have stump revisions due to infection. WHen his time was up there it seems that he thought he could stay with a family member here but that didn't work out and he has been Development worker, international aid Sets: AROM;Right;10 reps;Seated Hip Flexion/Marching: AROM;Right;10 reps;Standing Straight Leg Raises: AROM;Right;5 reps;Seated   Assessment/Plan    PT Assessment Patient needs continued PT services  PT Problem List Decreased strength;Decreased range of motion;Decreased activity tolerance;Decreased balance;Decreased mobility;Decreased coordination;Decreased cognition;Decreased safety awareness;Decreased knowledge of precautions;Pain;Decreased skin integrity       PT Treatment Interventions DME instruction;Gait training;Functional mobility training;Therapeutic activities;Therapeutic exercise;Balance training;Patient/family education;Cognitive remediation;Neuromuscular re-education    PT Goals (Current goals can be found in the Care Plan section)  Acute Rehab PT Goals Patient Stated Goal: figure out where he can go to get better PT Goal Formulation: With patient Time For Goal Achievement: 05/22/18 Potential to Achieve Goals: Fair    Frequency Min 2X/week   Barriers to discharge Decreased caregiver support homeless    Co-evaluation                AM-PAC PT "6 Clicks" Daily Activity  Outcome Measure Difficulty turning over in bed (including adjusting bedclothes, sheets and blankets)?: A Little Difficulty moving from lying on back to sitting on the side of the bed? : Unable Difficulty sitting down on and standing up from a chair with arms (e.g., wheelchair, bedside commode, etc,.)?: A Lot Help needed moving to and from a bed to chair (including a wheelchair)?: A Lot Help needed walking in hospital room?: Total Help needed climbing 3-5 steps with a railing? : Total 6 Click Score: 10    End of Session Equipment Utilized During Treatment: Gait belt Activity Tolerance: Patient limited by fatigue Patient left: in chair;with call bell/phone within reach;with chair alarm set Nurse Communication: Mobility status;Need for lift equipment PT Visit Diagnosis: Unsteadiness on feet (R26.81);Muscle weakness (generalized) (M62.81);Pain;Difficulty in walking, not elsewhere classified (R26.2) Pain - Right/Left: Right Pain - part of body: Leg    Time: 1610-9604 PT Time Calculation (min) (ACUTE ONLY): 13 min   Charges:   PT Evaluation $PT Eval Moderate Complexity: 1 Mod  Lyanne Co, PT  Acute Rehab Services  Pager [phone] Office [phone]   Lawana Chambers Saamir Armstrong 05/08/2018, 11:51 AM

## 2018-05-08 NOTE — Evaluation (Addendum)
Occupational Therapy Evaluation Patient Details Name: Cory Palmer MRN: [id number] DOB: [birth year] Today's Date: 05/08/2018    History of Present Illness Cory Palmer is a 57 y.o. male who presents with a poor fitting prosthesis on the right.  Patient has been end bearing on his residual limb developing pressure ulcers over the right transtibial amputation. PMH: CHF, DM, HTN, biploar, anxiety   Clinical Impression   PTA, pt was independent and living "on his own"; per chart review pt is homeless and has family in the area. Pt currently requiring Min A for lateral scoot to drop arm recliner and Mod A +2 for sit<>stand transfer with RW. Pt requiring Min Guard A for donning left shoe at EOB. Pt present with decreased strength and balance. During discussion of dc recommendation, pt stating "I am not going back to a nursing home." Pt would benefit from further acute OT to facilitate safe dc. Recommend dc to SNF for further OT to optimize safety, independence with ADLs, and return to PLOF.     Follow Up Recommendations  SNF;Supervision/Assistance - 24 hour    Equipment Recommendations  None recommended by OT    Recommendations for Other Services PT consult     Precautions / Restrictions Precautions Precautions: Fall Restrictions Weight Bearing Restrictions: Yes Other Position/Activity Restrictions: per Dr Audrie Lia note: Limit use of R prosthesis until new prosthesis is fabricated      Mobility Bed Mobility Overal bed mobility: Needs Assistance Bed Mobility: Supine to Sit     Supine to sit: Min guard     General bed mobility comments: Min Guard A for safety  Transfers Overall transfer level: Needs assistance Equipment used: Rolling walker (2 wheeled) Transfers: Sit to/from Stand Sit to Stand: Mod assist;+2 physical assistance         General transfer comment: Mod A +2 for power up into standing and then provide safe descent    Balance Overall balance assessment: Needs  assistance Sitting-balance support: No upper extremity supported Sitting balance-Leahy Scale: Good     Standing balance support: Bilateral upper extremity supported Standing balance-Leahy Scale: Poor Standing balance comment: unable to stand without UE support and mod A                           ADL either performed or assessed with clinical judgement   ADL Overall ADL's : Needs assistance/impaired Eating/Feeding: Independent;Sitting   Grooming: Set up;Sitting   Upper Body Bathing: Supervision/ safety;Set up;Sitting   Lower Body Bathing: Min guard;Sitting/lateral leans;Bed level   Upper Body Dressing : Set up;Supervision/safety;Sitting   Lower Body Dressing: Min guard;Moderate assistance;+2 for physical assistance;Sit to/from stand Lower Body Dressing Details (indicate cue type and reason): Min Guard for safety while pt donned left shoe  Toilet Transfer: Minimal assistance;Requires drop arm(lateral scoot) Toilet Transfer Details (indicate cue type and reason): Min A for blocking left knee while pt performed lateral scoot to drop arm recliner.          Functional mobility during ADLs: Minimal assistance(lateral scoot to drop arm recliner) General ADL Comments: Pt able to perform lateral scoot to recliner. Presents with decreased balance and strength. Requiring increased time and cues.     Vision         Perception     Praxis      Pertinent Vitals/Pain Pain Assessment: Faces Faces Pain Scale: Hurts little more Pain Location: RLE Pain Descriptors / Indicators: Constant Pain Intervention(s): Monitored during session;Limited activity  within patient's tolerance;Repositioned     Hand Dominance Right   Extremity/Trunk Assessment Upper Extremity Assessment Upper Extremity Assessment: Generalized weakness   Lower Extremity Assessment Lower Extremity Assessment: Defer to PT evaluation;RLE deficits/detail RLE Deficits / Details: BKA RLE Sensation: decreased  light touch RLE Coordination: WNL   Cervical / Trunk Assessment Cervical / Trunk Assessment: Normal   Communication Communication Communication: No difficulties   Cognition Arousal/Alertness: Awake/alert Behavior During Therapy: WFL for tasks assessed/performed Overall Cognitive Status: No family/caregiver present to determine baseline cognitive functioning                                 General Comments: Requiring increased time and cues for processing. Feel this is pt baseline   General Comments  Discussed dc plan, pt stating "I am not going to a nursing home!"    Exercises     Shoulder Instructions      Home Living Family/patient expects to be discharged to:: Unsure                                 Additional Comments: pt was homeless PTA      Prior Functioning/Environment Level of Independence: Independent with assistive device(s)        Comments: pt has been independent but has not been able to care for self well, reports he has been living on the ground.         OT Problem List: Decreased strength;Decreased range of motion;Decreased activity tolerance;Impaired balance (sitting and/or standing);Decreased knowledge of use of DME or AE;Decreased knowledge of precautions;Pain      OT Treatment/Interventions: Self-care/ADL training;Therapeutic exercise;Energy conservation;DME and/or AE instruction;Therapeutic activities;Patient/family education    OT Goals(Current goals can be found in the care plan section) Acute Rehab OT Goals Patient Stated Goal: "Be able to walk with my leg again." OT Goal Formulation: With patient Time For Goal Achievement: 05/22/18 Potential to Achieve Goals: Good ADL Goals Pt Will Perform Lower Body Dressing: with set-up;with supervision;sit to/from stand;sitting/lateral leans Pt Will Transfer to Toilet: with set-up;with supervision;stand pivot transfer;bedside commode Pt Will Perform Toileting - Clothing  Manipulation and hygiene: with set-up;with supervision;sitting/lateral leans  OT Frequency: Min 2X/week   Barriers to D/C:            Co-evaluation              AM-PAC PT "6 Clicks" Daily Activity     Outcome Measure Help from another person eating meals?: None Help from another person taking care of personal grooming?: A Little Help from another person toileting, which includes using toliet, bedpan, or urinal?: A Little Help from another person bathing (including washing, rinsing, drying)?: A Lot Help from another person to put on and taking off regular upper body clothing?: None Help from another person to put on and taking off regular lower body clothing?: A Lot 6 Click Score: 18   End of Session Equipment Utilized During Treatment: Gait belt;Rolling walker Nurse Communication: Mobility status;Precautions  Activity Tolerance: Patient tolerated treatment well Patient left: in chair;with call bell/phone within reach  OT Visit Diagnosis: Unsteadiness on feet (R26.81);Other abnormalities of gait and mobility (R26.89);Muscle weakness (generalized) (M62.81);Pain Pain - Right/Left: Right Pain - part of body: Leg                Time: 1550-1602 OT Time Calculation (min): 12 min Charges:  OT  General Charges $OT Visit: 1 Visit OT Evaluation $OT Eval Moderate Complexity: 1 Mod  Davette Nugent MSOT, OTR/L Acute Rehab Pager: [phone] Office: [phone]  Theodoro Grist Humphrey Guerreiro 05/08/2018, 4:43 PM

## 2018-05-08 NOTE — Plan of Care (Signed)
  Problem: Pain Managment: Goal: General experience of comfort will improve Outcome: Progressing   Problem: Safety: Goal: Ability to remain free from injury will improve Outcome: Progressing   

## 2018-05-08 NOTE — Progress Notes (Signed)
Initial Nutrition Assessment  DOCUMENTATION CODES:   Obesity unspecified  INTERVENTION:    MVI daily  Pro-stat 30 ml PO BID, each supplement provides 100 kcal and 15 gm protein  Glucerna Shake po BID, each supplement provides 220 kcal and 10 grams of protein  NUTRITION DIAGNOSIS:   Increased nutrient needs related to wound healing as evidenced by estimated needs.  GOAL:   Patient will meet greater than or equal to 90% of their needs  MONITOR:   PO intake, Supplement acceptance, Skin, Labs  REASON FOR ASSESSMENT:   Consult Wound healing, Malnutrition Eval  ASSESSMENT:   57 yo male with PMH of DM, bipolar D/O, CHF, HTN, and R BKA who was admitted on 10/7 with R BKA infected wound.  Patient is homeless. Suspect dietary intake PTA was of poor quality; likely not consuming enough protein or vitamins and minerals. Patient has a poorly fitting RLE prosthesis causing wounds. Plans for new prosthesis. Concern for osteomyelitis.  Unable to speak with patient at this time. Labs reviewed. Prealbumin 11.2 (L), magnesium 1.6 (L) CBG's: [phone] Low prealbumin likely reflective of acute inflammatory response with acute infection.  Medications reviewed and include Novolog, Lantus.   NUTRITION - FOCUSED PHYSICAL EXAM:  unable to complete NFPE at this time  Diet Order:   Diet Order            Diet Carb Modified Fluid consistency: Thin; Room service appropriate? 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

## 2018-05-09 DIAGNOSIS — L97519 Non-pressure chronic ulcer of other part of right foot with unspecified severity: Secondary | ICD-10-CM

## 2018-05-09 DIAGNOSIS — E10621 Type 1 diabetes mellitus with foot ulcer: Secondary | ICD-10-CM

## 2018-05-09 LAB — GLUCOSE, CAPILLARY
GLUCOSE-CAPILLARY: 116 mg/dL — AB (ref 70–99)
GLUCOSE-CAPILLARY: 142 mg/dL — AB (ref 70–99)
GLUCOSE-CAPILLARY: 122 mg/dL — AB (ref 70–99)
Glucose-Capillary: 152 mg/dL — ABNORMAL HIGH (ref 70–99)

## 2018-05-09 NOTE — NC FL2 (Signed)
[place] MEDICAID FL2 LEVEL OF CARE SCREENING TOOL     IDENTIFICATION  Patient Name: Cory Palmer Birthdate: [birth year] Sex: male Admission Date (Current Location): 05/07/2018  [facility] and [place] Number:  Producer, television/film/video and Address:  [facility] [place], [address], [place], [place] [address]      Provider Number: [id number]  Attending Physician Name and Address:  Alberteen Sam, *  Relative Name and Phone Number:  Georges Mouse [phone]    Current Level of Care: Hospital Recommended Level of Care: Skilled Nursing Facility Prior Approval Number:    Date Approved/Denied:   PASRR Number:    Discharge Plan: SNF    Current Diagnoses: Patient Active Problem List   Diagnosis Date Noted  . Wound infection 05/07/2018  . Homelessness 05/07/2018  . DM (diabetes mellitus), type 2 with complications (HCC) 05/07/2018  . Acute osteomyelitis involving lower leg, right (HCC) 05/07/2018    Orientation RESPIRATION BLADDER Height & Weight     Self, Time, Situation, Place  Normal Continent Weight: 260 lb (117.9 kg) Height:  6\' 4"  (193 cm)  BEHAVIORAL SYMPTOMS/MOOD NEUROLOGICAL BOWEL NUTRITION STATUS      Continent Diet(see discharges summary)  AMBULATORY STATUS COMMUNICATION OF NEEDS Skin   Limited Assist Verbally Skin abrasions(right leg bka blisters/infection)                       Personal Care Assistance Level of Assistance  Bathing, Feeding, Dressing, Total care Bathing Assistance: Limited assistance Feeding assistance: Independent Dressing Assistance: Limited assistance Total Care Assistance: Limited assistance   Functional Limitations Info  Hearing, Sight, Speech Sight Info: Adequate Hearing Info: Adequate Speech Info: Adequate    SPECIAL CARE FACTORS FREQUENCY  PT (By licensed PT), OT (By licensed OT)     PT Frequency: 5x weekly OT Frequency: 5x weekly            Contractures Contractures Info: Not present     Additional Factors Info                  Current Medications (05/09/2018):  This is the current hospital active medication list Current Facility-Administered Medications  Medication Dose Route Frequency Provider Last Rate Last Dose  . acetaminophen (TYLENOL) tablet 650 mg  650 mg Oral Q6H PRN Therisa Doyne, MD       Or  . acetaminophen (TYLENOL) suppository 650 mg  650 mg Rectal Q6H PRN Doutova, Anastassia, MD      . cefTRIAXone (ROCEPHIN) 2 g in sodium chloride 0.9 % 100 mL IVPB  2 g Intravenous QHS Doutova, Anastassia, MD 200 mL/hr at 05/08/18 2255 2 g at 05/08/18 2255   And  . metroNIDAZOLE (FLAGYL) IVPB 500 mg  500 mg Intravenous Q8H Doutova, Anastassia, MD 100 mL/hr at 05/09/18 1432 500 mg at 05/09/18 1432  . feeding supplement (GLUCERNA SHAKE) (GLUCERNA SHAKE) liquid 237 mL  237 mL Oral BID BM Hall, Carole N, DO   237 mL at 05/09/18 1429  . feeding supplement (PRO-STAT SUGAR FREE 64) liquid 30 mL  30 mL Oral BID Dow Adolph N, DO   30 mL at 05/09/18 1301  . HYDROcodone-acetaminophen (NORCO/VICODIN) 5-325 MG per tablet 1-2 tablet  1-2 tablet Oral Q4H PRN Doutova, Anastassia, MD      . Influenza vac split quadrivalent PF (FLUARIX) injection 0.5 mL  0.5 mL Intramuscular Tomorrow-1000 Hall, Carole N, DO      . insulin aspart (novoLOG) injection 0-9 Units  0-9  Units Subcutaneous TID WC Kirby-Graham, Beather Arbour, NP   1 Units at 05/09/18 1200  . metFORMIN (GLUCOPHAGE) tablet 1,000 mg  1,000 mg Oral BID WC Hall, Carole N, DO   1,000 mg at 05/09/18 1610  . multivitamin with minerals tablet 1 tablet  1 tablet Oral Daily Dow Adolph N, DO   1 tablet at 05/09/18 0946  . nicotine (NICODERM CQ - dosed in mg/24 hours) patch 21 mg  21 mg Transdermal Daily Therisa Doyne, MD   21 mg at 05/09/18 0946  . ondansetron (ZOFRAN) tablet 4 mg  4 mg Oral Q6H PRN Therisa Doyne, MD       Or  . ondansetron (ZOFRAN) injection 4 mg  4 mg Intravenous Q6H PRN Doutova, Anastassia, MD      .  polyethylene glycol (MIRALAX / GLYCOLAX) packet 17 g  17 g Oral Daily PRN Doutova, Anastassia, MD      . QUEtiapine (SEROQUEL) tablet 400 mg  400 mg Oral QHS Doutova, Anastassia, MD   400 mg at 05/08/18 2122  . traZODone (DESYREL) tablet 50 mg  50 mg Oral QHS Therisa Doyne, MD   50 mg at 05/08/18 2122     Discharge Medications: Please see discharge summary for a list of discharge medications.  Relevant Imaging Results:  Relevant Lab Results:   Additional Information SSN: [id number]  Gildardo Griffes, LCSW

## 2018-05-09 NOTE — Progress Notes (Signed)
PROGRESS NOTE    Cory Palmer  ZHY:[id number] DOB: Jan 16, 1961 DOA: 05/07/2018 PCP: Patient, [name] Per      Brief Narrative:  Cory Palmer is a 57 y.o. M with Bipolar, NIDDM, homelessness, R BKA, and dCHF who presents with right stump redness, pain and discharge from his ulcer.     Assessment & Plan:  Right BKA stump ulcer Discussed with Dr. [name], after review of MRI and fact that ulcer does not probe to bone, we feel the signal on MRI does not reflect osteomyelitis, but simply inflammation from poorly fitting prosthesis.  He has some ulcer infection as evidenced by the drainage and pain and certainly is at risk for loss to follow up, failure of treatment for infection and progression to osteomyelitis and further loss of limb. -Continue ceftriaxone, Flagyl -Follow up with Orthopedics in 1 week   Diabetes A1c 7.5%, not on medications -Continue new metformin -Hold home glargine -Continue SSI  Bipolar -Continue Seroquel, trazodone  Thrombocytopenia Mild, trending down -Follow CBC  Smoking Cessation recommended, modalities discussed. -Continue nicotine patch  Hypertension BP soft yesterday, normal today. -Hold metoprolol      DVT prophylaxis: SCD Code Status: FULL Family Communication: None present MDM and disposition Plan: The below labs and imaging reports were reviewed and summarized above.  Medication management as above.  The patient was admitted with a limb threatening infection, in particular, a BKA stump infection and concern for osteomyelitis.  We are arranging outpatient follow up for him in Dr. Audrie Lia office, in one week.  PT recommend SNF placement.  We will continue IV antibiotics for now, anticipate transition to Augmentin at discharge when safe discharge plan is in place.   Consultants:   Orthopedics  Procedures:   None  Antimicrobials:   Ceftriaxone  Flagyl    Subjective: No fever, no vomiting, no abdominal pain. Stump pain is  imporved. No confusion.  No cough, sputum.  Objective: Vitals:   05/08/18 1428 05/08/18 1959 05/09/18 0432 05/09/18 1309  BP: 114/60 (!) 114/57 118/65 131/64  Pulse: (!) 53 65 64 61  Resp:  18 16 20   Temp: (!) 97.4 F (36.3 C) 97.9 F (36.6 C) 97.7 F (36.5 C) 97.6 F (36.4 C)  TempSrc: Oral Oral Oral Oral  SpO2: 95% 96% 93% 98%  Weight:      Height:        Intake/Output Summary (Last 24 hours) at 05/09/2018 1440 Last data filed at 05/09/2018 0900 Gross per 24 hour  Intake 360 ml  Output 600 ml  Net -240 ml   Filed Weights   05/07/18 2017  Weight: 117.9 kg    Examination: General appearance:  adult male, alert and in no acute distress.   HEENT: Anicteric, conjunctiva pink, lids and lashes normal. No nasal deformity, discharge, epistaxis.  Lips moist, edentulous, OP dry, no oral lesions, hearing normal.   Skin: Warm and dry. no  jaundice.  No suspicious rashes or lesions. Cardiac: RRR, nl S1-S2, no murmurs appreciated.  Capillary refill is brisk.  JVP normal.  No LE edema.  Radia  pulses 2+ and symmetric. Respiratory: Normal respiratory rate and rhythm.  CTAB without rales or wheezes. Abdomen: Abdomen soft.  no TTP. No ascites, distension, hepatosplenomegaly.   MSK: No deformities or effusions except right BKA.  This is tender. Neuro: Awake and alert.  EOMI, moves all extremities. Speech fluent.    Psych: Sensorium intact and responding to questions, attention normal. Affect normal.  Judgment and insight appear normal.  Data Reviewed: I have personally reviewed following labs and imaging studies:  CBC: Recent Labs  Lab 05/07/18 2027 05/08/18 0228  WBC 10.7* 9.4  NEUTROABS 6.5 5.2  HGB 13.0 12.4*  HCT 37.5* 36.7*  MCV 94.5 96.3  PLT 141* 115*   Basic Metabolic Panel: Recent Labs  Lab 05/07/18 2027 05/08/18 0228  NA 140 138  K 3.7 3.6  CL 108 107  CO2 21* 21*  GLUCOSE 131* 109*  BUN 10 11  CREATININE 0.87 0.87  CALCIUM 9.2 8.7*  MG  --  1.6*  PHOS   --  3.1   GFR: Estimated Creatinine Clearance: 131.4 mL/min (by C-G formula based on SCr of 0.87 mg/dL). Liver Function Tests: Recent Labs  Lab 05/08/18 0228  AST 32  ALT 18  ALKPHOS 101  BILITOT 2.9*  PROT 6.4*  ALBUMIN 3.5   No results for input(s): LIPASE, AMYLASE in the last 168 hours. No results for input(s): AMMONIA in the last 168 hours. Coagulation Profile: No results for input(s): INR, PROTIME in the last 168 hours. Cardiac Enzymes: No results for input(s): CKTOTAL, CKMB, CKMBINDEX, TROPONINI in the last 168 hours. BNP (last 3 results) No results for input(s): PROBNP in the last 8760 hours. HbA1C: Recent Labs    05/08/18 0228  HGBA1C 7.5*   CBG: Recent Labs  Lab 05/08/18 1206 05/08/18 1645 05/08/18 2134 05/09/18 0629 05/09/18 1123  GLUCAP 126* 164* 109* 116* 142*   Lipid Profile: No results for input(s): CHOL, HDL, LDLCALC, TRIG, CHOLHDL, LDLDIRECT in the last 72 hours. Thyroid Function Tests: Recent Labs    05/08/18 0228  TSH 3.270   Anemia Panel: No results for input(s): VITAMINB12, FOLATE, FERRITIN, TIBC, IRON, RETICCTPCT in the last 72 hours. Urine analysis: No results found for: COLORURINE, APPEARANCEUR, LABSPEC, PHURINE, GLUCOSEU, HGBUR, BILIRUBINUR, KETONESUR, PROTEINUR, UROBILINOGEN, NITRITE, LEUKOCYTESUR Sepsis Labs: @LABRCNTIP (procalcitonin:4,lacticacidven:4)  ) Recent Results (from the past 240 hour(s))  MRSA PCR Screening     Status: None   Collection Time: 05/07/18 11:46 PM  Result Value Ref Range Status   MRSA by PCR NEGATIVE NEGATIVE Final    Comment:        The GeneXpert MRSA Assay (FDA approved for NASAL specimens only), is one component of a comprehensive MRSA colonization surveillance program. It is not intended to diagnose MRSA infection nor to guide or monitor treatment for MRSA infections. Performed at [facility], [address], [place], [place] [address]   Blood Cultures x 2 sites     Status:  None (Preliminary result)   Collection Time: 05/08/18  2:28 AM  Result Value Ref Range Status   Specimen Description BLOOD LEFT HAND  Final   Special Requests   Final    BOTTLES DRAWN AEROBIC AND ANAEROBIC Blood Culture adequate volume   Culture   Final    NO GROWTH 1 DAY Performed at [facility], [address] [place], [place] [address]    Report Status PENDING  Incomplete  Blood Cultures x 2 sites     Status: None (Preliminary result)   Collection Time: 05/08/18  2:31 AM  Result Value Ref Range Status   Specimen Description BLOOD RIGHT HAND  Final   Special Requests   Final    BOTTLES DRAWN AEROBIC ONLY Blood Culture adequate volume   Culture   Final    NO GROWTH 1 DAY Performed at [facility], [address] [place], [place] [address]    Report Status PENDING  Incomplete         Radiology Studies: Dg Tibia/fibula Right  Result Date: 05/07/2018 CLINICAL DATA:  Pt bib EMS and presents with right leg pain at the sight of his amputation. Amputation was done about 1.5 years ago. Pt reports pain has been presents for a "few months." Pt is homeless and reports his leg has been "sweating" a lot. Pt a/o x 4. EXAM: RIGHT TIBIA AND FIBULA - 2 VIEW COMPARISON:  None. FINDINGS: Amputated margin of the remaining proximal tibial diaphysis is somewhat ill-defined. Lateral margin of the amputated fibular diaphysis also appears somewhat ill-defined. Consider osteomyelitis in the proper clinical setting. No soft tissue air. No acute fracture.  No bone lesion.  Knee joint is normally aligned. IMPRESSION: 1. Somewhat ill-defined margins of the amputated proximal tibial and fibular diaphyses. This may be a chronic postsurgical appearance. Osteomyelitis should be considered in the proper clinical setting. Electronically Signed   By: Amie Portland M.D.   On: 05/07/2018 20:23   Mr Tibia Fibula Right W Wo Contrast  Result Date: 05/07/2018 CLINICAL DATA:  Status post above knee amputation  1.5 years ago with pain and bloody discharge now noted from the stump for the past month. EXAM: MRI OF LOWER RIGHT EXTREMITY WITHOUT AND WITH CONTRAST TECHNIQUE: Multiplanar, multisequence MR imaging of the right lower extremity was performed both before and after administration of intravenous contrast. CONTRAST:  10 mL Gadavist COMPARISON:  None. FINDINGS: Bones/Joint/Cartilage Abnormal marrow signal abnormality involving the tip of the tibial stump status post right above knee amputation with ill-defined cortical margins at its tip. The area of marrow signal abnormality extends more proximally for at least 2.6 cm. Findings are concerning for osteomyelitis given overlying subcutaneous soft tissue edema and trace enhancing soft tissue fluid associated with a cutaneous ulcer along the tip of the stump. No marrow signal abnormality of the distal fibula Subchondral cyst of the lateral tibial plateau deep to the lateral tibial spine. Trace joint effusion noted of the included knee. Small popliteal cyst is identified. Ligaments Noncontributory Muscles and Tendons Atrophic appearance of the remaining calf musculature. Soft tissues Diffuse soft tissue edema involving the stump of the above knee amputation. Subtle enhancing serpiginous fluid in the soft tissues, series 11/35 for example that may represent small non drainable abscesses. IMPRESSION: Marrow signal abnormality/edema of the tibial amputated margin with subtle cortical bone loss associated with soft tissue edema, tiny cutaneous ulceration and thin enhancing serpiginous non drainable fluid collections would be in keeping with changes of acute osteomyelitis. Electronically Signed   By: Tollie Eth M.D.   On: 05/07/2018 22:56   Dg Chest Port 1 View  Result Date: 05/08/2018 CLINICAL DATA:  CHF EXAM: PORTABLE CHEST 1 VIEW COMPARISON:  None. FINDINGS: The lungs are well-expanded. There is no focal infiltrate or pleural effusion. The heart is mildly enlarged. The  central pulmonary vascularity is prominent. The mediastinum is normal in width. The bony thorax exhibits no acute abnormality. IMPRESSION: Mild cardiomegaly and pulmonary vascular congestion is consistent with low-grade CHF. No alveolar pneumonia. Electronically Signed   By: David  [place] M.D.   On: 05/08/2018 09:24        Scheduled Meds: . feeding supplement (GLUCERNA SHAKE)  237 mL Oral BID BM  . feeding supplement (PRO-STAT SUGAR FREE 64)  30 mL Oral BID  . Influenza vac split quadrivalent PF  0.5 mL Intramuscular Tomorrow-1000  . insulin aspart  0-9 Units Subcutaneous TID WC  . metFORMIN  1,000 mg Oral BID  WC  . multivitamin with minerals  1 tablet Oral Daily  . nicotine  21 mg Transdermal Daily  . QUEtiapine  400 mg Oral QHS  . traZODone  50 mg Oral QHS   Continuous Infusions: . cefTRIAXone (ROCEPHIN)  IV 2 g (05/08/18 2255)   And  . metronidazole 500 mg (05/09/18 1432)     LOS: 2 days    Time spent: 25 minutes    Alberteen Sam, MD [facility] 05/09/2018, 2:40 PM     Pager [phone] --- please page though AMION:  [email or web address] Password [email or web address] If 7PM-7AM, please contact night-coverage

## 2018-05-09 NOTE — Progress Notes (Signed)
After consultation with Dr. Maryfrances Bunnell, CSW re consulted with patient regarding PT recommendation of SNF. Patient reported that he doesn't want to go to SNF but it sounds like that's what we want him to do, CSW explained that was the current recommendation and he reported he would be open to going for a short period of time.   CSW inquired if patient had called any of the multitude of resources CSW had given to patient yesterday. Patient reported he had not. CSW asked for barriers to doing this, as patient had inquired about resources, these were provided and patient is in access to a phone to be able to call resources and find out what is available for him to utilize in the community. Patient was unable to identify barriers to this, and reported he would do this today. CSW encouraged patient to call community resources so he has a plan in place once leaving SNF.   Patient has no preference for SNF, CSW will send out referrals and if/when accepted will request SNF to start insurance authorization asap.   [place], [place] [phone]

## 2018-05-10 DIAGNOSIS — I10 Essential (primary) hypertension: Secondary | ICD-10-CM

## 2018-05-10 DIAGNOSIS — L97909 Non-pressure chronic ulcer of unspecified part of unspecified lower leg with unspecified severity: Secondary | ICD-10-CM

## 2018-05-10 DIAGNOSIS — E11622 Type 2 diabetes mellitus with other skin ulcer: Secondary | ICD-10-CM

## 2018-05-10 LAB — GLUCOSE, CAPILLARY
GLUCOSE-CAPILLARY: 110 mg/dL — AB (ref 70–99)
Glucose-Capillary: 144 mg/dL — ABNORMAL HIGH (ref 70–99)

## 2018-05-10 LAB — BASIC METABOLIC PANEL
Anion gap: 9 (ref 5–15)
BUN: 7 mg/dL (ref 6–20)
CO2: 20 mmol/L — ABNORMAL LOW (ref 22–32)
Calcium: 8.5 mg/dL — ABNORMAL LOW (ref 8.9–10.3)
Chloride: 110 mmol/L (ref 98–111)
Creatinine, Ser: 0.7 mg/dL (ref 0.61–1.24)
Glucose, Bld: 114 mg/dL — ABNORMAL HIGH (ref 70–99)
POTASSIUM: 3.5 mmol/L (ref 3.5–5.1)
SODIUM: 139 mmol/L (ref 135–145)

## 2018-05-10 LAB — CBC
HCT: 36.3 % — ABNORMAL LOW (ref 39.0–52.0)
HEMOGLOBIN: 11.8 g/dL — AB (ref 13.0–17.0)
MCH: 31.1 pg (ref 26.0–34.0)
MCHC: 32.5 g/dL (ref 30.0–36.0)
MCV: 95.5 fL (ref 80.0–100.0)
PLATELETS: 90 10*3/uL — AB (ref 150–400)
RBC: 3.8 MIL/uL — AB (ref 4.22–5.81)
RDW: 13.6 % (ref 11.5–15.5)
WBC: 5.6 10*3/uL (ref 4.0–10.5)
nRBC: 0 % (ref 0.0–0.2)

## 2018-05-10 MED ORDER — PRO-STAT SUGAR FREE PO LIQD: 30.0000 mL | Freq: Two times a day (BID) | ORAL | 0 refills | Status: DC

## 2018-05-10 MED ORDER — GLUCERNA SHAKE PO LIQD: 237.0000 mL | Freq: Two times a day (BID) | ORAL | 0 refills | Status: DC

## 2018-05-10 MED ORDER — ADULT MULTIVITAMIN W/MINERALS CH: 1.0000 | ORAL_TABLET | Freq: Every day | ORAL | 0 refills | Status: DC

## 2018-05-10 MED ORDER — AMOXICILLIN-POT CLAVULANATE 875-125 MG PO TABS: 1.0000 | ORAL_TABLET | Freq: Two times a day (BID) | ORAL | 0 refills | Status: AC

## 2018-05-10 MED ORDER — METFORMIN HCL 1000 MG PO TABS: 1000.0000 mg | ORAL_TABLET | Freq: Two times a day (BID) | ORAL | 1 refills | Status: DC

## 2018-05-10 MED ORDER — ACETAMINOPHEN 325 MG PO TABS: 650.0000 mg | ORAL_TABLET | Freq: Four times a day (QID) | ORAL | 0 refills | Status: DC | PRN

## 2018-05-10 MED ORDER — NICOTINE 21 MG/24HR TD PT24: 21.0000 mg | MEDICATED_PATCH | Freq: Every day | TRANSDERMAL | 0 refills | Status: DC

## 2018-05-10 NOTE — Progress Notes (Signed)
Report called to [facility]. [facility] here to transport patient to rehab.

## 2018-05-10 NOTE — Clinical Social Work Placement (Signed)
   CLINICAL SOCIAL WORK PLACEMENT  NOTE  Date:  05/10/2018  Patient Details  Name: Cory Palmer MRN: [id number] Date of Birth: [birth year]  Clinical Social Work is seeking post-discharge placement for this patient at the Skilled  Nursing Facility level of care (*CSW will initial, date and re-position this form in  chart as items are completed):  Yes   Patient/family provided with [facility] Clinical Social Work Department's list of facilities offering this level of care within the geographic area requested by the patient (or if unable, by the patient's family).  Yes   Patient/family informed of their freedom to choose among providers that offer the needed level of care, that participate in Medicare, Medicaid or managed care program needed by the patient, have an available bed and are willing to accept the patient.      Patient/family informed of [facility]'s ownership interest in [facility] and [facility], as well as of the fact that they are under no obligation to receive care at these facilities.  PASRR submitted to EDS on       PASRR number received on 05/10/18     Existing PASRR number confirmed on       FL2 transmitted to all facilities in geographic area requested by pt/family on 05/09/18     FL2 transmitted to all facilities within larger geographic area on       Patient informed that his/her managed care company has contracts with or will negotiate with certain facilities, including the following:        Yes   Patient/family informed of bed offers received.  Patient chooses bed at [facility]     Physician recommends and patient chooses bed at      Patient to be transferred to [facility] on 05/10/18.  Patient to be transferred to facility by [facility]     Patient family notified on   of transfer.  Name of family member notified:  None identified     PHYSICIAN       Additional Comment:     _______________________________________________ Gildardo Griffes, LCSW 05/10/2018, 10:17 AM

## 2018-05-10 NOTE — Care Management Important Message (Signed)
Important Message  Patient Details  Name: Cory Palmer MRN: [id number] Date of Birth: August 12, 1960   Medicare Important Message Given:  Yes    Dempsey Knotek Stefan Church 05/10/2018, 4:39 PM

## 2018-05-10 NOTE — Discharge Summary (Signed)
Physician Discharge Summary  EMERSEN CARROLL ZOX:[id number] DOB: 1960/11/28 DOA: 05/07/2018  PCP: Patient, [name] Per  Admit date: 05/07/2018 Discharge date: 05/10/2018  Admitted From: Homeless  Disposition:  [facility]   Recommendations for Outpatient Follow-up:  1. Please follow up with Dr. [name] in 1 week 2. Please obtain new PCP and repeat HgbA1c in 3 months (February) 3. Please obtain CBC to monitor platelets in 3 months   Home Health: N/A  Equipment/Devices: TBD at SNF  Discharge Condition: Fair  CODE STATUS: FULL Diet recommendation: Diabetic  Brief/Interim Summary: Mr. Nygard is a 57 y.o. M with Bipolar, NIDDM, homelessness, R BKA, and dCHF who presents with right stump redness, pain and discharge from his ulcer.  MRI and x-ray obtained in the ER appeared to show osteomyelitis.     Discharge Diagnoses:   Diabetic foot/stump infection The patient needed and started on empiric ceftriaxone and Flagyl.  After orthopedics evaluated the ulcer and found that it did not probe to bone, it was felt that the MRI did not reflect osteomyelitis, but simply inflammation from a poorly fitting prosthesis, resulting in cellulitis and infected ulcer.  Given his homelessness and lack of resources, the patient was unable to formulate a follow up plan prior to discharge.  He was unable to articulate appropriate wound care, nor plans for obtaining prescriptions of antibiotics and transportation to Orthopedics office.  He is high risk for readmission, failure of follow up and potentially loss of limb.  PT recommended SNF rehab, and so this was arranged.   -He was discharged with Augmentin to complete 7 days.   -He has orthopedics follow-up in 1 week.   -He was started on nutritional supplements for wound healing.   Diabetes Lantus was stopped.  Hemoglobin A1c was less than 7%.  He was started on metformin 1000 mg twice daily.  Needs repeat hemoglobin A1c in 3 months.  Thrombocytopenia This  was mild.  Needs follow-up in 3 months.  Bipolar disorder No change to home regimen of Seroquel and trazodone.  Smoking Smoking cessation was recommended, modalities discussed.  Hypertension Blood pressure was soft while he was here, metoprolol was stopped and he was normotensive.        Discharge Instructions  Discharge Instructions    Diet Carb Modified   Complete by:  As directed    Discharge instructions   Complete by:  As directed    From Dr. Maryfrances Bunnell: You were admitted with an oozing infected ulcer on your right leg.   This ulcer probably formed because your old prosthesis didn't fit well. You need to contact [facility] to get your new prosthesis. Also, follow up with a leg and bone doctor (an Orthopedist) like Dr. Davene Costain have an appointment with him scheduled already on Oct 16, next Wednesday, at [facility].  Also, take Augmentin 875-125 twice daily for 5 more days.  It is important that you find a primary care doctor in [place].   Resume your home Seroquel and trazodone. For your blood pressure, it is not necessary to take metoprolol. Find a new primary care doctor and have them check your blood pressure and if needed, restart a blood pressure medicine. For your diabetes, it is not necessary to take Lantus insulin for now. Try metformin 1000 mg twice daily and follow up with your primary care doctor when you have one. Lastly, when you have a new primary care doctor, have them check your complete blood count, to monitor your platelet count.  ONE MORE THING: IT IS IMPORTANT TO STOP SMOKING!   Increase activity slowly   Complete by:  As directed      Allergies as of 05/10/2018   No Known Allergies     Medication List    STOP taking these medications   Insulin Glargine 100 UNIT/ML Solostar Pen Commonly known as:  LANTUS   metoprolol tartrate 25 MG tablet Commonly known as:  LOPRESSOR   NOVOLOG FLEXPEN [place]     TAKE these medications   acetaminophen 325  MG tablet Commonly known as:  TYLENOL Take 2 tablets (650 mg total) by mouth every 6 (six) hours as needed for mild pain (or Fever >/= 101).   amoxicillin-clavulanate 875-125 MG tablet Commonly known as:  AUGMENTIN Take 1 tablet by mouth 2 (two) times daily for 5 days.   docusate sodium 100 MG capsule Commonly known as:  COLACE Take 100 mg by mouth daily.   feeding supplement (GLUCERNA SHAKE) Liqd Take 237 mLs by mouth 2 (two) times daily between meals.   feeding supplement (PRO-STAT SUGAR FREE 64) Liqd Take 30 mLs by mouth 2 (two) times daily.   metFORMIN 1000 MG tablet Commonly known as:  GLUCOPHAGE Take 1 tablet (1,000 mg total) by mouth 2 (two) times daily with a meal.   multivitamin with minerals Tabs tablet Take 1 tablet by mouth daily. Start taking on:  05/11/2018   nicotine 21 mg/24hr patch Commonly known as:  NICODERM CQ - dosed in mg/24 hours Place 1 patch (21 mg total) onto the skin daily. Start taking on:  05/11/2018   QUEtiapine 400 MG tablet Commonly known as:  SEROQUEL Take 400 mg by mouth at bedtime.   traZODone 50 MG tablet Commonly known as:  DESYREL Take 50 mg by mouth at bedtime.      Contact information for after-discharge care    Destination    [facility] .   Service:  Skilled Nursing Contact information: [address] [place] [address] [phone]             No Known Allergies  Consultations:  Orthopedics   Procedures/Studies: Dg Tibia/fibula Right  Result Date: 05/07/2018 CLINICAL DATA:  Pt bib EMS and presents with right leg pain at the sight of his amputation. Amputation was done about 1.5 years ago. Pt reports pain has been presents for a "few months." Pt is homeless and reports his leg has been "sweating" a lot. Pt a/o x 4. EXAM: RIGHT TIBIA AND FIBULA - 2 VIEW COMPARISON:  None. FINDINGS: Amputated margin of the remaining proximal tibial diaphysis is somewhat  ill-defined. Lateral margin of the amputated fibular diaphysis also appears somewhat ill-defined. Consider osteomyelitis in the proper clinical setting. No soft tissue air. No acute fracture.  No bone lesion.  Knee joint is normally aligned. IMPRESSION: 1. Somewhat ill-defined margins of the amputated proximal tibial and fibular diaphyses. This may be a chronic postsurgical appearance. Osteomyelitis should be considered in the proper clinical setting. Electronically Signed   By: Amie Portland M.D.   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 EXAM: MRI OF LOWER RIGHT EXTREMITY WITHOUT AND WITH CONTRAST TECHNIQUE: Multiplanar, multisequence MR imaging of the right lower extremity was performed both before and after administration of intravenous contrast. CONTRAST:  10 mL  Gadavist COMPARISON:  None. FINDINGS: Bones/Joint/Cartilage Abnormal marrow signal abnormality involving the tip of the tibial stump status post right above knee amputation with ill-defined cortical margins at its tip. The area of marrow signal abnormality extends more proximally for at least 2.6 cm. Findings are concerning for osteomyelitis given overlying subcutaneous soft tissue edema and trace enhancing soft tissue fluid associated with a cutaneous ulcer along the tip of the stump. No marrow signal abnormality of the distal fibula Subchondral cyst of the lateral tibial plateau deep to the lateral tibial spine. Trace joint effusion noted of the included knee. Small popliteal cyst is identified. Ligaments Noncontributory Muscles and Tendons Atrophic appearance of the remaining calf musculature. Soft tissues Diffuse soft tissue edema involving the stump of the above knee amputation. Subtle enhancing serpiginous fluid in the soft tissues, series 11/35 for example that  may represent small non drainable abscesses. IMPRESSION: Marrow signal abnormality/edema of the tibial amputated margin with subtle cortical bone loss associated with soft tissue edema, tiny cutaneous ulceration and thin enhancing serpiginous non drainable fluid collections would be in keeping with changes of acute osteomyelitis. Electronically Signed   By: Tollie Eth M.D.   On: 05/07/2018 22:56   Dg Chest Port 1 View  Result Date: 05/08/2018 CLINICAL DATA:  CHF EXAM: PORTABLE CHEST 1 VIEW COMPARISON:  None. FINDINGS: The lungs are well-expanded. There is no focal infiltrate or pleural effusion. The heart is mildly enlarged. The central pulmonary vascularity is prominent. The mediastinum is normal in width. The bony thorax exhibits no acute abnormality. IMPRESSION: Mild cardiomegaly and pulmonary vascular congestion is consistent with low-grade CHF. No alveolar pneumonia. Electronically Signed   By: David  [place] M.D.   On: 05/08/2018 09:24      Subjective: Feels well.  No fever, no vomiting.  No confusion.  No chest pain or dyspnea.  Discharge Exam: Vitals:   05/10/18 0400 05/10/18 0833  BP: 120/80 134/69  Pulse: 96 62  Resp: 19 18  Temp: (!) 97.1 F (36.2 C) 98.3 F (36.8 C)  SpO2: 100% 95%   Vitals:   05/09/18 1309 05/09/18 2130 05/10/18 0400 05/10/18 0833  BP: 131/64 (!) 146/90 120/80 134/69  Pulse: 61 72 96 62  Resp: 20 18 19 18   Temp: 97.6 F (36.4 C) 98.2 F (36.8 C) (!) 97.1 F (36.2 C) 98.3 F (36.8 C)  TempSrc: Oral Oral Oral Oral  SpO2: 98% 96% 100% 95%  Weight:      Height:        General: Pt is alert, awake, not in acute distress, lying in bed Cardiovascular: RRR, S1/S2 +, no rubs, no gallops Respiratory: CTA bilaterally, no wheezing, no rhonchi Abdominal: Soft, NT, ND, bowel sounds + Extremities: no edema, no cyanosis, the stump wounds were examined without redness, induration surrounding, but with found odor and discharge.    The results of significant  diagnostics from this hospitalization (including imaging, microbiology, ancillary and laboratory) are listed below for reference.     Microbiology: Recent Results (from the past 240 hour(s))  MRSA PCR Screening     Status: None   Collection Time: 05/07/18 11:46 PM  Result Value Ref Range Status   MRSA by PCR NEGATIVE NEGATIVE Final    Comment:        The GeneXpert MRSA Assay (FDA approved for NASAL specimens only), is one component of a comprehensive MRSA colonization surveillance program. It is not intended to diagnose MRSA infection nor to guide or monitor treatment for MRSA infections. Performed  at [facility], [address], [place], [place] [address]   Blood Cultures x 2 sites     Status: None (Preliminary result)   Collection Time: 05/08/18  2:28 AM  Result Value Ref Range Status   Specimen Description BLOOD LEFT HAND  Final   Special Requests   Final    BOTTLES DRAWN AEROBIC AND ANAEROBIC Blood Culture adequate volume   Culture   Final    NO GROWTH 2 DAYS Performed at [facility], [address], [place], [place] [address]    Report Status PENDING  Incomplete  Blood Cultures x 2 sites     Status: None (Preliminary result)   Collection Time: 05/08/18  2:31 AM  Result Value Ref Range Status   Specimen Description BLOOD RIGHT HAND  Final   Special Requests   Final    BOTTLES DRAWN AEROBIC ONLY Blood Culture adequate volume   Culture   Final    NO GROWTH 2 DAYS Performed at [facility], [address], [place], [place] [address]    Report Status PENDING  Incomplete     Labs: BNP (last 3 results) No results for input(s): BNP in the last 8760 hours. Basic Metabolic Panel: Recent Labs  Lab 05/07/18 2027 05/08/18 0228 05/10/18 0554  NA 140 138 139  K 3.7 3.6 3.5  CL 108 107 110  CO2 21* 21* 20*  GLUCOSE 131* 109* 114*  BUN 10 11 7   CREATININE 0.87 0.87 0.70  CALCIUM 9.2 8.7* 8.5*  MG  --  1.6*  --   PHOS  --  3.1  --     Liver Function Tests: Recent Labs  Lab 05/08/18 0228  AST 32  ALT 18  ALKPHOS 101  BILITOT 2.9*  PROT 6.4*  ALBUMIN 3.5   No results for input(s): LIPASE, AMYLASE in the last 168 hours. No results for input(s): AMMONIA in the last 168 hours. CBC: Recent Labs  Lab 05/07/18 2027 05/08/18 0228 05/10/18 0554  WBC 10.7* 9.4 5.6  NEUTROABS 6.5 5.2  --   HGB 13.0 12.4* 11.8*  HCT 37.5* 36.7* 36.3*  MCV 94.5 96.3 95.5  PLT 141* 115* 90*   Cardiac Enzymes: No results for input(s): CKTOTAL, CKMB, CKMBINDEX, TROPONINI in the last 168 hours. BNP: Invalid input(s): POCBNP CBG: Recent Labs  Lab 05/09/18 0629 05/09/18 1123 05/09/18 1620 05/09/18 2128 05/10/18 0653  GLUCAP 116* 142* 122* 152* 110*   D-Dimer No results for input(s): DDIMER in the last 72 hours. Hgb A1c Recent Labs    05/08/18 0228  HGBA1C 7.5*   Lipid Profile No results for input(s): CHOL, HDL, LDLCALC, TRIG, CHOLHDL, LDLDIRECT in the last 72 hours. Thyroid function studies Recent Labs    05/08/18 0228  TSH 3.270   Anemia work up No results for input(s): VITAMINB12, FOLATE, FERRITIN, TIBC, IRON, RETICCTPCT in the last 72 hours. Urinalysis No results found for: COLORURINE, APPEARANCEUR, LABSPEC, PHURINE, GLUCOSEU, HGBUR, BILIRUBINUR, KETONESUR, PROTEINUR, UROBILINOGEN, NITRITE, LEUKOCYTESUR Sepsis Labs Invalid input(s): PROCALCITONIN,  WBC,  LACTICIDVEN Microbiology Recent Results (from the past 240 hour(s))  MRSA PCR Screening     Status: None   Collection Time: 05/07/18 11:46 PM  Result Value Ref Range Status   MRSA by PCR NEGATIVE NEGATIVE Final    Comment:        The GeneXpert MRSA Assay (FDA approved for NASAL specimens only), is one component of a comprehensive MRSA colonization surveillance program. It is not intended to diagnose  MRSA infection nor to guide or monitor treatment for MRSA infections. Performed at [facility], [address], [place], [place]  [address]   Blood Cultures x 2 sites     Status: None (Preliminary result)   Collection Time: 05/08/18  2:28 AM  Result Value Ref Range Status   Specimen Description BLOOD LEFT HAND  Final   Special Requests   Final    BOTTLES DRAWN AEROBIC AND ANAEROBIC Blood Culture adequate volume   Culture   Final    NO GROWTH 2 DAYS Performed at [facility], [address] [place], [place] [address]    Report Status PENDING  Incomplete  Blood Cultures x 2 sites     Status: None (Preliminary result)   Collection Time: 05/08/18  2:31 AM  Result Value Ref Range Status   Specimen Description BLOOD RIGHT HAND  Final   Special Requests   Final    BOTTLES DRAWN AEROBIC ONLY Blood Culture adequate volume   Culture   Final    NO GROWTH 2 DAYS Performed at [facility], [address], [place], [place] [address]    Report Status PENDING  Incomplete     Time coordinating discharge: 25 minutes       SIGNED:   Alberteen Sam, MD  [facility] 05/10/2018, 9:32 AM

## 2018-05-10 NOTE — Progress Notes (Signed)
PASRR Number: [id number]

## 2018-05-10 NOTE — Progress Notes (Addendum)
Patient will DC to: [facility] Anticipated DC date: 05/10/18 Family notified: none identified Transport by: Sharin Mons  Per MD patient ready for DC to [facility]. RN, patient, patient's family, and facility notified of DC. Discharge Summary sent to facility. RN given number for report [phone] Room 3214 . DC packet on chart. Ambulance transport requested for patient for 2:00 pm (when bed ready at [facility]) CSW signing off.  [place], [place] [phone]

## 2018-05-13 LAB — CULTURE, BLOOD (ROUTINE X 2)
CULTURE: NO GROWTH
Culture: NO GROWTH
Special Requests: ADEQUATE
Special Requests: ADEQUATE

## 2018-05-16 ENCOUNTER — Ambulatory Visit (INDEPENDENT_AMBULATORY_CARE_PROVIDER_SITE_OTHER): Payer: Medicare Other | Admitting: Orthopedic Surgery

## 2018-05-17 ENCOUNTER — Ambulatory Visit (INDEPENDENT_AMBULATORY_CARE_PROVIDER_SITE_OTHER): Payer: Medicare Other | Admitting: Physician Assistant

## 2018-05-17 ENCOUNTER — Encounter (INDEPENDENT_AMBULATORY_CARE_PROVIDER_SITE_OTHER): Payer: Self-pay | Admitting: Orthopedic Surgery

## 2018-05-17 VITALS — Ht 76.0 in | Wt 260.0 lb

## 2018-05-17 DIAGNOSIS — E1142 Type 2 diabetes mellitus with diabetic polyneuropathy: Secondary | ICD-10-CM

## 2018-05-17 DIAGNOSIS — Z89432 Acquired absence of left foot: Secondary | ICD-10-CM

## 2018-05-17 DIAGNOSIS — Z89511 Acquired absence of right leg below knee: Secondary | ICD-10-CM

## 2018-05-18 ENCOUNTER — Telehealth (INDEPENDENT_AMBULATORY_CARE_PROVIDER_SITE_OTHER): Payer: Self-pay | Admitting: Orthopedic Surgery

## 2018-05-18 NOTE — Telephone Encounter (Signed)
Cory Palmer  [phone]  [phone] Fax    Shelly called from [facility] wanted to know if they needed to DC treatment of stump, patient stated Dr.Duda told him it was ok to walk on prosthetic.

## 2018-05-20 ENCOUNTER — Encounter (INDEPENDENT_AMBULATORY_CARE_PROVIDER_SITE_OTHER): Payer: Self-pay | Admitting: Physician Assistant

## 2018-05-20 NOTE — Progress Notes (Addendum)
Office Visit Note   Patient: Cory Palmer           Date of Birth: 06-Mar-1961           MRN: [id number] Visit Date: 05/17/2018              Requested by: No referring provider defined for this encounter. PCP: Patient, [name] Per  Chief Complaint  Patient presents with  . Right Leg - Pain      HPI: The patient is a 57 year old male who is seen in follow-up following his recent hospitalization.  While hospitalized he was noted to have ulcers over his right transtibial amputation.  The patient recently moved to [place] to be closer to family.  He had a poor fitting prosthesis and had been end bearing on his residual limb and developed the ulcerations.  The ulcerations were treated with local care and have healed.  He presents today for prescription for [facility] for new K-2 prosthesis and insert and spacer for left shoe for his trans-metatarsal amputation on the left side.  Assessment & Plan: Visit Diagnoses:  1. 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 Type 2 diabetes mellitus with diabetic polyneuropathy, unspecified whether long term insulin use (HCC)   3. History of transmetatarsal amputation of left foot (HCC)     Plan: The patient was given a prescription for [facility] for a new right transtibial K-2 prosthesis and supplies as well as insert and spacer for his left shoe. It is expected that the patient will be a limited community ambulator and has the ability for traverse low level environmental barriers such as curbs, stairs or other uneven surfaces.   He will follow-up here in 3 months.  Follow-Up Instructions: Return in about 3 months (around 08/17/2018).   Ortho Exam  Patient is alert, oriented, no adenopathy, well-dressed, normal affect, normal respiratory effort. The right below the knee amputation distal ulcers have healed.  There is no signs of cellulitis.  There is minimal edema. Left foot shows well-healed amputation as well.   No signs of cellulitis. He has pitting edema both residual lower extremities. Imaging: No results found. No images are attached to the encounter.  Labs: Lab Results  Component Value Date   HGBA1C 7.5 (H) 05/08/2018   ESRSEDRATE 16 05/07/2018   CRP <0.8 05/07/2018   REPTSTATUS 05/13/2018 FINAL 05/08/2018   CULT  05/08/2018    NO GROWTH 5 DAYS Performed at [facility], [address] [place], [place] [address]      Lab Results  Component Value Date   ALBUMIN 3.5 05/08/2018   PREALBUMIN 11.2 (L) 05/08/2018    Body mass index is 31.65 kg/m.  Orders:  No orders of the defined types were placed in this encounter.  No orders of the defined types were placed in this encounter.    Procedures: No procedures performed  Clinical Data: No additional findings.  ROS:  All other systems negative, except as noted in the HPI. Review of Systems  Objective: Vital Signs: Ht 6\' 4"  (1.93 m)   Wt 260 lb (117.9 kg)   BMI 31.65 kg/m   Specialty Comments:  No specialty comments available.  PMFS History: Patient Active Problem List   Diagnosis Date Noted  . Wound infection 05/07/2018  . Homelessness 05/07/2018  . DM (diabetes mellitus), type 2 with complications (HCC) 05/07/2018  . Acute osteomyelitis involving lower leg, right (HCC) 05/07/2018   Past  Medical History:  Diagnosis Date  . Anxiety   . CHF (congestive heart failure) (HCC)   . Depressed bipolar disorder (HCC)   . Diabetes mellitus without complication (HCC)   . Hypertension     Family History  Problem Relation Age of Onset  . Hypertension Other   . Diabetes Mellitus II Other     Past Surgical History:  Procedure Laterality Date  . BELOW KNEE LEG AMPUTATION Right    Social History   Occupational History  . Not on file  Tobacco Use  . Smoking status: Current Every Day Smoker  . Smokeless tobacco: Never Used  Substance and Sexual Activity  . Alcohol use: Yes    Comment: occasional  . Drug  use: Not Currently  . Sexual activity: Not on file

## 2018-05-22 ENCOUNTER — Other Ambulatory Visit (INDEPENDENT_AMBULATORY_CARE_PROVIDER_SITE_OTHER): Payer: Self-pay

## 2018-05-22 NOTE — Telephone Encounter (Signed)
Faxed note to number provided. The pt came in for a prosthetic eval for the right BKA rx written for prosthetic and supplies. No wound care needed as long as ulcerations remain healed.

## 2018-06-05 ENCOUNTER — Ambulatory Visit (INDEPENDENT_AMBULATORY_CARE_PROVIDER_SITE_OTHER): Payer: Medicare Other | Admitting: Orthopaedic Surgery

## 2018-08-09 ENCOUNTER — Ambulatory Visit (INDEPENDENT_AMBULATORY_CARE_PROVIDER_SITE_OTHER): Payer: Medicare Other | Admitting: Orthopedic Surgery

## 2018-08-27 ENCOUNTER — Other Ambulatory Visit: Payer: Self-pay

## 2018-08-27 ENCOUNTER — Encounter (HOSPITAL_COMMUNITY): Payer: Self-pay

## 2018-08-27 ENCOUNTER — Emergency Department (HOSPITAL_COMMUNITY)
Admission: EM | Admit: 2018-08-27 | Discharge: 2018-08-28 | Disposition: A | Discharge status: Home / Self Care | Payer: Medicare Other | Attending: Emergency Medicine | Admitting: Emergency Medicine

## 2018-08-27 DIAGNOSIS — W19XXXA Unspecified fall, initial encounter: Secondary | ICD-10-CM | POA: Diagnosis not present

## 2018-08-27 DIAGNOSIS — I509 Heart failure, unspecified: Secondary | ICD-10-CM | POA: Insufficient documentation

## 2018-08-27 DIAGNOSIS — Z79899 Other long term (current) drug therapy: Secondary | ICD-10-CM | POA: Diagnosis not present

## 2018-08-27 DIAGNOSIS — Z89511 Acquired absence of right leg below knee: Secondary | ICD-10-CM | POA: Diagnosis not present

## 2018-08-27 DIAGNOSIS — Y9389 Activity, other specified: Secondary | ICD-10-CM | POA: Diagnosis not present

## 2018-08-27 DIAGNOSIS — Y929 Unspecified place or not applicable: Secondary | ICD-10-CM | POA: Insufficient documentation

## 2018-08-27 DIAGNOSIS — E119 Type 2 diabetes mellitus without complications: Secondary | ICD-10-CM | POA: Insufficient documentation

## 2018-08-27 DIAGNOSIS — Z760 Encounter for issue of repeat prescription: Secondary | ICD-10-CM

## 2018-08-27 DIAGNOSIS — S99922A Unspecified injury of left foot, initial encounter: Secondary | ICD-10-CM | POA: Diagnosis present

## 2018-08-27 DIAGNOSIS — R600 Localized edema: Secondary | ICD-10-CM | POA: Insufficient documentation

## 2018-08-27 DIAGNOSIS — F17210 Nicotine dependence, cigarettes, uncomplicated: Secondary | ICD-10-CM | POA: Diagnosis not present

## 2018-08-27 DIAGNOSIS — S91105A Unspecified open wound of left lesser toe(s) without damage to nail, initial encounter: Secondary | ICD-10-CM | POA: Insufficient documentation

## 2018-08-27 DIAGNOSIS — I110 Hypertensive heart disease with heart failure: Secondary | ICD-10-CM | POA: Diagnosis not present

## 2018-08-27 DIAGNOSIS — Y999 Unspecified external cause status: Secondary | ICD-10-CM | POA: Diagnosis not present

## 2018-08-27 DIAGNOSIS — Z7984 Long term (current) use of oral hypoglycemic drugs: Secondary | ICD-10-CM | POA: Insufficient documentation

## 2018-08-27 DIAGNOSIS — S91109A Unspecified open wound of unspecified toe(s) without damage to nail, initial encounter: Secondary | ICD-10-CM

## 2018-08-27 LAB — COMPREHENSIVE METABOLIC PANEL
ALT: 6 U/L (ref 0–44)
AST: 27 U/L (ref 15–41)
Albumin: 2.9 g/dL — ABNORMAL LOW (ref 3.5–5.0)
Alkaline Phosphatase: 115 U/L (ref 38–126)
Anion gap: 10 (ref 5–15)
BILIRUBIN TOTAL: 1 mg/dL (ref 0.3–1.2)
BUN: 5 mg/dL — AB (ref 6–20)
CALCIUM: 9 mg/dL (ref 8.9–10.3)
CO2: 21 mmol/L — ABNORMAL LOW (ref 22–32)
Chloride: 103 mmol/L (ref 98–111)
Creatinine, Ser: 0.62 mg/dL (ref 0.61–1.24)
GFR calc Af Amer: >60 mL/min (ref >60)
Glucose, Bld: 293 mg/dL — ABNORMAL HIGH (ref 70–99)
POTASSIUM: 4 mmol/L (ref 3.5–5.1)
Sodium: 134 mmol/L — ABNORMAL LOW (ref 135–145)
TOTAL PROTEIN: 7.1 g/dL (ref 6.5–8.1)

## 2018-08-27 LAB — CBC WITH DIFFERENTIAL/PLATELET
Abs Immature Granulocytes: 0.03 10*3/uL (ref 0.00–0.07)
Basophils Absolute: 0 10*3/uL (ref 0.0–0.1)
Basophils Relative: 1 %
Eosinophils Absolute: 0.4 10*3/uL (ref 0.0–0.5)
Eosinophils Relative: 6 %
HCT: 31.5 % — ABNORMAL LOW (ref 39.0–52.0)
Hemoglobin: 10.7 g/dL — ABNORMAL LOW (ref 13.0–17.0)
Immature Granulocytes: 1 %
Lymphocytes Relative: 19 %
Lymphs Abs: 1.1 10*3/uL (ref 0.7–4.0)
MCH: 32.8 pg (ref 26.0–34.0)
MCHC: 34 g/dL (ref 30.0–36.0)
MCV: 96.6 fL (ref 80.0–100.0)
Monocytes Absolute: 0.5 10*3/uL (ref 0.1–1.0)
Monocytes Relative: 9 %
Neutro Abs: 3.9 10*3/uL (ref 1.7–7.7)
Neutrophils Relative %: 64 %
Platelets: 130 10*3/uL — ABNORMAL LOW (ref 150–400)
RBC: 3.26 MIL/uL — ABNORMAL LOW (ref 4.22–5.81)
RDW: 13.4 % (ref 11.5–15.5)
WBC: 6 10*3/uL (ref 4.0–10.5)
nRBC: 0 % (ref 0.0–0.2)

## 2018-08-27 LAB — LACTIC ACID, PLASMA: Lactic Acid, Venous: 1.4 mmol/L (ref 0.5–1.9)

## 2018-08-27 MED ORDER — SODIUM CHLORIDE 0.9% FLUSH: 3.0000 mL | Freq: Once | INTRAVENOUS | Status: DC

## 2018-08-27 NOTE — ED Triage Notes (Signed)
Pt endorses infection to left foot x 2 months. Pt moved here from [place] recently and has not been to a dr about it. Great toe is black with green drainage. Afebrile. Pt has right bka and ambulates with a cain. Pt has been staying in a motel but homeless recently. Pt is diabetic. VSS

## 2018-08-28 MED ORDER — METFORMIN HCL 500 MG PO TABS
1000.0000 mg | ORAL_TABLET | Freq: Once | ORAL | Status: AC
  Administered 2018-08-28: 1000 mg via ORAL
  Filled 2018-08-28: qty 2

## 2018-08-28 MED ORDER — QUETIAPINE FUMARATE 400 MG PO TABS: 400.0000 mg | ORAL_TABLET | Freq: Every day | ORAL | 0 refills | Status: DC

## 2018-08-28 MED ORDER — TRAZODONE HCL 100 MG PO TABS: 100.0000 mg | ORAL_TABLET | Freq: Every day | ORAL | 0 refills | Status: DC

## 2018-08-28 MED ORDER — DOXYCYCLINE HYCLATE 100 MG PO TABS
100.0000 mg | ORAL_TABLET | Freq: Once | ORAL | Status: AC
  Administered 2018-08-28: 100 mg via ORAL
  Filled 2018-08-28: qty 1

## 2018-08-28 MED ORDER — DOXYCYCLINE HYCLATE 100 MG PO CAPS: 100.0000 mg | ORAL_CAPSULE | Freq: Two times a day (BID) | ORAL | 0 refills | Status: DC

## 2018-08-28 MED ORDER — METFORMIN HCL 1000 MG PO TABS: 1000.0000 mg | ORAL_TABLET | Freq: Two times a day (BID) | ORAL | 1 refills | Status: DC

## 2018-08-28 NOTE — ED Notes (Signed)
Patient verbalizes understanding of discharge instructions. Opportunity for questioning and answers were provided. Armband removed by staff, pt discharged from ED. Wheeled out to lobby  

## 2018-08-28 NOTE — Discharge Instructions (Addendum)
Steps to find a Primary Care Provider (PCP): ° °Call [phone] or [phone] to access "[facility] Find a Doctor Service." ° °2.  You may also go on the [facility] website at [email or web address] ° °3.  [facility] also frequently accepts new patients. ° °[facility]  °[address] °[place] [address] °[phone] ° °4.  There are also multiple [facility], [facility] and [facility] practices throughout the [facility] that are frequently accepting new patients. You may find a clinic that is close to your home and contact them. ° °[facility] °[email or web address] °[phone] ° °[facility] °[email or web address] ° °[facility] °[email or web address] °[phone] ° °[facility] °[email or web address] °[phone] ° °5.  Local Health Departments also can provide primary care services. ° °[facility]  °[address] °[place] [address] °[phone] ° °[facility] °[address] °[place] [address] °[phone] ° °[facility] °[address]  °[place] [address] °[phone] ° ° °

## 2018-08-28 NOTE — ED Notes (Signed)
Patient verbalizes understanding of discharge instructions. Opportunity for questioning and answers were provided. Armband removed by staff, pt discharged from ED.  

## 2018-08-28 NOTE — ED Provider Notes (Signed)
TIME SEEN: 1:53 AM  CHIEF COMPLAINT: Chronic wound  HPI: Patient is a 58 year old male with history of hypertension, diabetes, CHF, bipolar disorder who presents to the emergency department with a wound to the left fourth toe that he has had for 2 to 3 months.  He states that it has not significantly changed but he "decided to finally get it checked out".  No fevers, vomiting, diarrhea.  States that this leg has also been swollen for several weeks.  No injury that he can recall.  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  He also states that he is out of his metformin, trazodone and Seroquel and is asking for [place] to refill these medications today.  ROS: See HPI Constitutional: no fever  Eyes: no drainage  ENT: no runny nose   Cardiovascular:  no chest pain  Resp: no SOB  GI: no vomiting GU: no dysuria Integumentary: no rash  Allergy: no hives  Musculoskeletal: no leg swelling  Neurological: no slurred speech ROS otherwise negative  PAST MEDICAL HISTORY/PAST SURGICAL HISTORY:  Past Medical History:  Diagnosis Date  . Anxiety   . CHF (congestive heart failure) (HCC)   . Depressed bipolar disorder (HCC)   . Diabetes mellitus without complication (HCC)   . Hypertension     MEDICATIONS:  Prior to Admission medications   Medication Sig Start Date End Date Taking? Authorizing Provider  acetaminophen (TYLENOL) 325 MG tablet Take 2 tablets (650 mg total) by mouth every 6 (six) hours as needed for mild pain (or Fever >/= 101). 05/10/18   Danford, Earl Lites, MD  Amino Acids-Protein Hydrolys (FEEDING SUPPLEMENT, PRO-STAT SUGAR FREE 64,) LIQD Take 30 mLs by mouth 2 (two) times daily. 05/10/18   Danford, Earl Lites, MD  docusate sodium (COLACE) 100 MG capsule Take 100 mg by mouth daily.    [provider]  feeding supplement, GLUCERNA SHAKE,  (GLUCERNA SHAKE) LIQD Take 237 mLs by mouth 2 (two) times daily between meals. 05/10/18   Danford, Earl Lites, MD  metFORMIN (GLUCOPHAGE) 1000 MG tablet Take 1 tablet (1,000 mg total) by mouth 2 (two) times daily with a meal. 05/10/18   Danford, Earl Lites, MD  Multiple Vitamin (MULTIVITAMIN WITH MINERALS) TABS tablet Take 1 tablet by mouth daily. 05/11/18   Danford, Earl Lites, MD  nicotine (NICODERM CQ - DOSED IN MG/24 HOURS) 21 mg/24hr patch Place 1 patch (21 mg total) onto the skin daily. 05/11/18   Danford, Earl Lites, MD  QUEtiapine (SEROQUEL) 400 MG tablet Take 400 mg by mouth at bedtime.    [provider]  traZODone (DESYREL) 50 MG tablet Take 50 mg by mouth at bedtime.    [provider]    ALLERGIES:  No Known Allergies  SOCIAL HISTORY:  Social History   Tobacco Use  . Smoking status: Current Every Day Smoker    Types: Cigarettes  . Smokeless tobacco: Never Used  Substance Use Topics  . Alcohol use: Yes    Comment: occasional    FAMILY HISTORY: Family History  Problem Relation Age of Onset  . Hypertension Other   . Diabetes Mellitus II Other     EXAM: BP 132/69 (BP Location: Right Arm)   Pulse 64   Temp 97.6 F (36.4 C) (Oral)   Resp 18   SpO2 100%  CONSTITUTIONAL: Alert and oriented and  responds appropriately to questions. Well-appearing; well-nourished HEAD: Normocephalic EYES: Conjunctivae clear, pupils appear equal, EOMI ENT: normal nose; moist mucous membranes NECK: Supple, no meningismus, no nuchal rigidity, no LAD  CARD: RRR; S1 and S2 appreciated; no murmurs, no clicks, no rubs, no gallops RESP: Normal chest excursion without splinting or tachypnea; breath sounds clear and equal bilaterally; no wheezes, no rhonchi, no rales, no hypoxia or respiratory distress, speaking full sentences ABD/GI: Normal bowel sounds; non-distended; soft, non-tender, no rebound, no guarding, no peritoneal signs, no hepatosplenomegaly BACK:  The  back appears normal and is non-tender to palpation, there is no CVA tenderness EXT: Patient is status post right BKA, left lower extremity is swollen diffusely without pitting edema, there is no calf tenderness on examination, no redness or warmth noted, compartments are soft, I am able to Doppler strong biphasic DP and PT pulses in the left lower extremity, he is status post amputation of the first through third left toes.  There is an open wound to the left dorsal fourth toe with granulation tissue and a very minimal amount of yellow appearing drainage with some foul odor.  No surrounding redness, warmth, crepitus, subcutaneous emphysema.  No significant tenderness with palpation or movement of this toe. SKIN: Normal color for age and race; warm; no rash NEURO: Moves all extremities equally PSYCH: The patient's mood and manner are appropriate. Grooming and personal hygiene are appropriate.  MEDICAL DECISION MAKING: Patient here with what appears to be a chronic wound to the left fourth toe.  It does not appear acutely infected.  His blood sugar is slightly elevated at 293 but he has been without metformin.  He is not in DKA.  Given there is some very minimal foul-smelling drainage on exam, will start him on doxycycline but I do not feel at this time he needs admission to the hospital.  I feel that he needs the most is outpatient wound care and a primary care physician.  Have provided him information for both of these things.  I have refilled his metformin, Seroquel and trazodone per his request.  I feel he can alternate Tylenol and ibuprofen as needed for pain.  We discussed wound care instructions at length and return precautions.  Patient verbalized understanding.   At this time, I do not feel there is any life-threatening condition present. I have reviewed and discussed all results (EKG, imaging, lab, urine as appropriate) and exam findings with patient/family. I have reviewed nursing notes and  appropriate previous records.  I feel the patient is safe to be discharged home without further emergent workup and can continue workup as an outpatient as needed. Discussed usual and customary return precautions. Patient/family verbalize understanding and are comfortable with this plan.  Outpatient follow-up has been provided as needed. All questions have been answered.           Griffey Nicasio, Layla Maw, DO 08/28/18 [phone]

## 2018-09-01 LAB — CULTURE, BLOOD (ROUTINE X 2)
Culture: NO GROWTH
Special Requests: ADEQUATE

## 2018-09-24 ENCOUNTER — Ambulatory Visit (INDEPENDENT_AMBULATORY_CARE_PROVIDER_SITE_OTHER): Payer: Medicare Other

## 2018-09-24 ENCOUNTER — Ambulatory Visit (INDEPENDENT_AMBULATORY_CARE_PROVIDER_SITE_OTHER): Payer: Medicare Other | Admitting: Orthopedic Surgery

## 2018-09-24 ENCOUNTER — Telehealth (INDEPENDENT_AMBULATORY_CARE_PROVIDER_SITE_OTHER): Payer: Self-pay | Admitting: Orthopedic Surgery

## 2018-09-24 ENCOUNTER — Encounter (INDEPENDENT_AMBULATORY_CARE_PROVIDER_SITE_OTHER): Payer: Self-pay | Admitting: Orthopedic Surgery

## 2018-09-24 VITALS — Ht 76.0 in | Wt 260.0 lb

## 2018-09-24 DIAGNOSIS — M79672 Pain in left foot: Secondary | ICD-10-CM | POA: Diagnosis not present

## 2018-09-24 DIAGNOSIS — Z89511 Acquired absence of right leg below knee: Secondary | ICD-10-CM

## 2018-09-24 DIAGNOSIS — M86272 Subacute osteomyelitis, left ankle and foot: Secondary | ICD-10-CM | POA: Diagnosis not present

## 2018-09-24 NOTE — Progress Notes (Signed)
Office Visit Note   Patient: Cory Palmer           Date of Birth: 1960/09/23           MRN: [id number] Visit Date: 09/24/2018              Requested by: No referring provider defined for this encounter. PCP: Patient, [name] Per  Chief Complaint  Patient presents with  . 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  Patient states that he needs a prescription for a new socket due to instability he is currently wearing over 20 ply stockings and has no rotational stability he states that he has episodes where he is almost fallen due to the instability with the prosthesis.  Assessment & Plan: Visit Diagnoses:  1. Pain in left foot   2. Subacute osteomyelitis, left ankle and foot (HCC)   3. S/P below knee amputation, right (HCC)     Plan: Patient was given a prescription for new liner new socket new materials and supplies for the right transtibial amputation.  Patient will need to proceed with revision surgery on the left foot to a transmetatarsal amputation.  Patient currently does not have any stable living environment and will most likely need discharge either to skilled nursing or inpatient rehab in order for the left foot to heal properly.  Follow-Up Instructions: Return in about 2 weeks (around 10/08/2018).   Ortho Exam  Patient is alert, oriented, no adenopathy, well-dressed, normal affect, normal respiratory effort. Examination left foot patient has a good dorsalis pedis pulse he does have venous stasis swelling of the entire left lower extremity he has healed ulcers.  He has sausage digit swelling of the fourth toe with exposed bone and drainage.  Radiograph shows chronic osteomyelitis.  Examination the right transtibial amputation he has a poorly fitting prosthesis there is no rotational stability the liner and socket are worn out and he is currently wearing  over 20 ply stockings.  Laboratory studies show moderate protein caloric malnutrition with hemoglobin A1c of 7.5.  Imaging: Xr Foot Complete Left  Result Date: 09/24/2018 3 view radiographs of the left foot shows a previous ray amputation of the first second and third metatarsals.  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  There are multiple retained needles within the soft tissue.  No images are attached to the encounter.  Labs: Lab Results  Component Value Date   HGBA1C 7.5 (H) 05/08/2018   ESRSEDRATE 16 05/07/2018   CRP <0.8 05/07/2018   REPTSTATUS 09/01/2018 FINAL 08/27/2018   CULT NO GROWTH 5 DAYS 08/27/2018     Lab Results  Component Value Date   ALBUMIN 2.9 (L) 08/27/2018   ALBUMIN 3.5 05/08/2018   PREALBUMIN 11.2 (L) 05/08/2018    Body mass index is 31.65 kg/m.  Orders:  Orders Placed This Encounter  Procedures  . XR Foot Complete Left   No orders of the defined types were placed in this encounter.    Procedures: No procedures performed  Clinical Data: No additional findings.  ROS:  All other systems negative, except as noted in the HPI. Review of Systems  Objective: Vital Signs: Ht 6\' 4"  (1.93 m)   Wt 260 lb (117.9 kg)   BMI 31.65 kg/m   Specialty Comments:  No specialty comments available.  PMFS History: Patient Active Problem List   Diagnosis Date Noted  . Wound infection 05/07/2018  . Homelessness 05/07/2018  . DM (diabetes mellitus), type 2 with complications (HCC) 05/07/2018  . Acute osteomyelitis involving lower leg, right (HCC) 05/07/2018   Past Medical History:  Diagnosis Date  . Anxiety   . CHF (congestive heart failure) (HCC)   . Depressed bipolar disorder (HCC)   . Diabetes mellitus without complication (HCC)   . Hypertension     Family History  Problem Relation Age of Onset  . Hypertension Other   . Diabetes Mellitus II Other     Past Surgical  History:  Procedure Laterality Date  . BELOW KNEE LEG AMPUTATION Right    Social History   Occupational History  . Not on file  Tobacco Use  . Smoking status: Current Every Day Smoker    Types: Cigarettes  . Smokeless tobacco: Never Used  Substance and Sexual Activity  . Alcohol use: Yes    Comment: occasional  . Drug use: Not Currently  . Sexual activity: Not on file

## 2018-09-24 NOTE — Telephone Encounter (Signed)
Pt called and left VM saying his ride was late and he needed to reschedule.  I called pt back but it went straight to VM  about rescheduling his apt

## 2018-09-24 NOTE — Telephone Encounter (Signed)
The pt is currently in the office right now seeing Dr. [name]

## 2018-09-25 ENCOUNTER — Ambulatory Visit (INDEPENDENT_AMBULATORY_CARE_PROVIDER_SITE_OTHER): Payer: Self-pay | Admitting: Physician Assistant

## 2018-09-26 ENCOUNTER — Encounter (HOSPITAL_BASED_OUTPATIENT_CLINIC_OR_DEPARTMENT_OTHER): Payer: Medicare Other | Attending: Physician Assistant

## 2018-09-27 MED ORDER — DEXTROSE 5 % IV SOLN
3.0000 g | INTRAVENOUS | Status: AC
  Administered 2018-09-28: 3 g via INTRAVENOUS
  Filled 2018-09-27: qty 3

## 2018-09-27 NOTE — Progress Notes (Signed)
I called every number listed for patient- no answers, voice mail, out of service.  I called and spoke with Consuello Bossier to see if she has a number that I do not have. Elnita Maxwell could not find a number, she did recall trhat he is staying in a long term hotel, no one remembered where.  Elnita Maxwell said she instructed patient to arrive at  7 or 7:30 AM. I will continue to try to reach patient.

## 2018-09-28 ENCOUNTER — Encounter (HOSPITAL_COMMUNITY)
Admission: RE | Disposition: A | Discharge status: Home Health | Payer: Self-pay | Source: Home / Self Care | Attending: Orthopedic Surgery

## 2018-09-28 ENCOUNTER — Inpatient Hospital Stay (HOSPITAL_COMMUNITY)
Admission: RE | Admit: 2018-09-28 | Discharge: 2018-09-29 | DRG: 617 | Disposition: A | Discharge status: Home Health | Payer: Medicare Other | Attending: Orthopedic Surgery | Admitting: Orthopedic Surgery

## 2018-09-28 ENCOUNTER — Encounter (HOSPITAL_COMMUNITY): Payer: Self-pay | Admitting: Certified Registered"

## 2018-09-28 ENCOUNTER — Inpatient Hospital Stay (HOSPITAL_COMMUNITY): Payer: Medicare Other | Admitting: Anesthesiology

## 2018-09-28 DIAGNOSIS — Z89511 Acquired absence of right leg below knee: Secondary | ICD-10-CM | POA: Diagnosis not present

## 2018-09-28 DIAGNOSIS — E1169 Type 2 diabetes mellitus with other specified complication: Secondary | ICD-10-CM | POA: Diagnosis present

## 2018-09-28 DIAGNOSIS — M86672 Other chronic osteomyelitis, left ankle and foot: Secondary | ICD-10-CM | POA: Diagnosis present

## 2018-09-28 DIAGNOSIS — I110 Hypertensive heart disease with heart failure: Secondary | ICD-10-CM | POA: Diagnosis present

## 2018-09-28 DIAGNOSIS — Z79899 Other long term (current) drug therapy: Secondary | ICD-10-CM

## 2018-09-28 DIAGNOSIS — Z833 Family history of diabetes mellitus: Secondary | ICD-10-CM

## 2018-09-28 DIAGNOSIS — F419 Anxiety disorder, unspecified: Secondary | ICD-10-CM | POA: Diagnosis present

## 2018-09-28 DIAGNOSIS — F17210 Nicotine dependence, cigarettes, uncomplicated: Secondary | ICD-10-CM | POA: Diagnosis present

## 2018-09-28 DIAGNOSIS — I509 Heart failure, unspecified: Secondary | ICD-10-CM | POA: Diagnosis present

## 2018-09-28 DIAGNOSIS — Z89422 Acquired absence of other left toe(s): Secondary | ICD-10-CM | POA: Diagnosis not present

## 2018-09-28 DIAGNOSIS — M869 Osteomyelitis, unspecified: Secondary | ICD-10-CM | POA: Diagnosis not present

## 2018-09-28 DIAGNOSIS — Z7984 Long term (current) use of oral hypoglycemic drugs: Secondary | ICD-10-CM | POA: Diagnosis not present

## 2018-09-28 DIAGNOSIS — Z8249 Family history of ischemic heart disease and other diseases of the circulatory system: Secondary | ICD-10-CM | POA: Diagnosis not present

## 2018-09-28 DIAGNOSIS — Z89432 Acquired absence of left foot: Secondary | ICD-10-CM

## 2018-09-28 DIAGNOSIS — M868X7 Other osteomyelitis, ankle and foot: Secondary | ICD-10-CM | POA: Diagnosis present

## 2018-09-28 DIAGNOSIS — F3130 Bipolar disorder, current episode depressed, mild or moderate severity, unspecified: Secondary | ICD-10-CM | POA: Diagnosis present

## 2018-09-28 HISTORY — DX: Acquired absence of left foot: Z89.432

## 2018-09-28 HISTORY — PX: AMPUTATION: SHX166

## 2018-09-28 LAB — HEMOGLOBIN A1C
Hgb A1c MFr Bld: 6.6 % — ABNORMAL HIGH (ref 4.8–5.6)
Mean Plasma Glucose: 142.72 mg/dL

## 2018-09-28 LAB — GLUCOSE, CAPILLARY
Glucose-Capillary: 87 mg/dL (ref 70–99)
Glucose-Capillary: 90 mg/dL (ref 70–99)
Glucose-Capillary: 80 mg/dL (ref 70–99)
Glucose-Capillary: 108 mg/dL — ABNORMAL HIGH (ref 70–99)
Glucose-Capillary: 135 mg/dL — ABNORMAL HIGH (ref 70–99)

## 2018-09-28 LAB — CBC
HCT: 38.8 % — ABNORMAL LOW (ref 39.0–52.0)
Hemoglobin: 12.7 g/dL — ABNORMAL LOW (ref 13.0–17.0)
MCH: 32 pg (ref 26.0–34.0)
MCHC: 32.7 g/dL (ref 30.0–36.0)
MCV: 97.7 fL (ref 80.0–100.0)
Platelets: 85 10*3/uL — ABNORMAL LOW (ref 150–400)
RBC: 3.97 MIL/uL — ABNORMAL LOW (ref 4.22–5.81)
RDW: 13.4 % (ref 11.5–15.5)
WBC: 6.8 10*3/uL (ref 4.0–10.5)
nRBC: 0 % (ref 0.0–0.2)

## 2018-09-28 LAB — BASIC METABOLIC PANEL
Anion gap: 12 (ref 5–15)
BUN: 8 mg/dL (ref 6–20)
CHLORIDE: 106 mmol/L (ref 98–111)
CO2: 16 mmol/L — AB (ref 22–32)
Calcium: 8.9 mg/dL (ref 8.9–10.3)
Creatinine, Ser: 0.78 mg/dL (ref 0.61–1.24)
GFR calc Af Amer: >60 mL/min (ref >60)
GFR calc non Af Amer: >60 mL/min (ref >60)
Glucose, Bld: 102 mg/dL — ABNORMAL HIGH (ref 70–99)
Potassium: 3.8 mmol/L (ref 3.5–5.1)
Sodium: 134 mmol/L — ABNORMAL LOW (ref 135–145)

## 2018-09-28 SURGERY — AMPUTATION, FOOT, PARTIAL
Anesthesia: General | Laterality: Left

## 2018-09-28 MED ORDER — FENTANYL CITRATE (PF) 100 MCG/2ML IJ SOLN: 25.0000 ug | INTRAMUSCULAR | Status: DC | PRN

## 2018-09-28 MED ORDER — PHENYLEPHRINE 40 MCG/ML (10ML) SYRINGE FOR IV PUSH (FOR BLOOD PRESSURE SUPPORT)
PREFILLED_SYRINGE | INTRAVENOUS | Status: DC | PRN
  Administered 2018-09-28 (×2): 80 ug via INTRAVENOUS

## 2018-09-28 MED ORDER — ONDANSETRON HCL 4 MG/2ML IJ SOLN: 4.0000 mg | Freq: Four times a day (QID) | INTRAMUSCULAR | Status: DC | PRN

## 2018-09-28 MED ORDER — PROPOFOL 10 MG/ML IV BOLUS
INTRAVENOUS | Status: DC | PRN
  Administered 2018-09-28: 160 mg via INTRAVENOUS

## 2018-09-28 MED ORDER — POLYETHYLENE GLYCOL 3350 17 G PO PACK: 17.0000 g | PACK | Freq: Every day | ORAL | Status: DC | PRN

## 2018-09-28 MED ORDER — HYDROMORPHONE HCL 1 MG/ML IJ SOLN: 0.5000 mg | INTRAMUSCULAR | Status: DC | PRN

## 2018-09-28 MED ORDER — METOCLOPRAMIDE HCL 5 MG/ML IJ SOLN: 5.0000 mg | Freq: Three times a day (TID) | INTRAMUSCULAR | Status: DC | PRN

## 2018-09-28 MED ORDER — QUETIAPINE FUMARATE 400 MG PO TABS
400.0000 mg | ORAL_TABLET | Freq: Every day | ORAL | Status: DC
  Administered 2018-09-28: 400 mg via ORAL
  Filled 2018-09-28: qty 1

## 2018-09-28 MED ORDER — LIDOCAINE 2% (20 MG/ML) 5 ML SYRINGE
INTRAMUSCULAR | Status: DC | PRN
  Administered 2018-09-28: 60 mg via INTRAVENOUS

## 2018-09-28 MED ORDER — OXYCODONE HCL 5 MG PO TABS
5.0000 mg | ORAL_TABLET | ORAL | Status: DC | PRN
  Filled 2018-09-28: qty 1

## 2018-09-28 MED ORDER — EPHEDRINE SULFATE-NACL 50-0.9 MG/10ML-% IV SOSY
PREFILLED_SYRINGE | INTRAVENOUS | Status: DC | PRN
  Administered 2018-09-28 (×2): 10 mg via INTRAVENOUS

## 2018-09-28 MED ORDER — TRAZODONE HCL 100 MG PO TABS
100.0000 mg | ORAL_TABLET | Freq: Every day | ORAL | Status: DC
  Administered 2018-09-28: 100 mg via ORAL
  Filled 2018-09-28: qty 1

## 2018-09-28 MED ORDER — ACETAMINOPHEN 325 MG PO TABS: 325.0000 mg | ORAL_TABLET | Freq: Four times a day (QID) | ORAL | Status: DC | PRN

## 2018-09-28 MED ORDER — METOCLOPRAMIDE HCL 5 MG PO TABS: 5.0000 mg | ORAL_TABLET | Freq: Three times a day (TID) | ORAL | Status: DC | PRN

## 2018-09-28 MED ORDER — OXYCODONE HCL 5 MG PO TABS
ORAL_TABLET | ORAL | Status: AC
  Administered 2018-09-28: 20:00:00
  Filled 2018-09-28: qty 1

## 2018-09-28 MED ORDER — MIDAZOLAM HCL 5 MG/5ML IJ SOLN
INTRAMUSCULAR | Status: DC | PRN
  Administered 2018-09-28: 2 mg via INTRAVENOUS

## 2018-09-28 MED ORDER — ACETAMINOPHEN 500 MG PO TABS
1000.0000 mg | ORAL_TABLET | Freq: Four times a day (QID) | ORAL | Status: AC
  Administered 2018-09-28 – 2018-09-29 (×4): 1000 mg via ORAL
  Filled 2018-09-28 (×4): qty 2

## 2018-09-28 MED ORDER — FENTANYL CITRATE (PF) 100 MCG/2ML IJ SOLN
INTRAMUSCULAR | Status: DC | PRN
  Administered 2018-09-28: 50 ug via INTRAVENOUS

## 2018-09-28 MED ORDER — MIDAZOLAM HCL 2 MG/2ML IJ SOLN
INTRAMUSCULAR | Status: AC
  Filled 2018-09-28: qty 2

## 2018-09-28 MED ORDER — FENTANYL CITRATE (PF) 250 MCG/5ML IJ SOLN
INTRAMUSCULAR | Status: AC
  Filled 2018-09-28: qty 5

## 2018-09-28 MED ORDER — LACTATED RINGERS IV SOLN
INTRAVENOUS | Status: DC
  Administered 2018-09-28: 07:00:00 via INTRAVENOUS

## 2018-09-28 MED ORDER — DOCUSATE SODIUM 100 MG PO CAPS
100.0000 mg | ORAL_CAPSULE | Freq: Two times a day (BID) | ORAL | Status: DC
  Administered 2018-09-28 (×2): 100 mg via ORAL
  Filled 2018-09-28 (×3): qty 1

## 2018-09-28 MED ORDER — ONDANSETRON HCL 4 MG PO TABS: 4.0000 mg | ORAL_TABLET | Freq: Four times a day (QID) | ORAL | Status: DC | PRN

## 2018-09-28 MED ORDER — INSULIN ASPART 100 UNIT/ML ~~LOC~~ SOLN: 0.0000 [IU] | Freq: Three times a day (TID) | SUBCUTANEOUS | Status: DC

## 2018-09-28 MED ORDER — OXYCODONE HCL 5 MG PO TABS
5.0000 mg | ORAL_TABLET | Freq: Once | ORAL | Status: AC | PRN
  Administered 2018-09-28: 5 mg via ORAL

## 2018-09-28 MED ORDER — OXYCODONE HCL 5 MG PO TABS: 10.0000 mg | ORAL_TABLET | ORAL | Status: DC | PRN

## 2018-09-28 MED ORDER — OXYCODONE HCL 5 MG/5ML PO SOLN: 5.0000 mg | Freq: Once | ORAL | Status: AC | PRN

## 2018-09-28 MED ORDER — 0.9 % SODIUM CHLORIDE (POUR BTL) OPTIME
TOPICAL | Status: DC | PRN
  Administered 2018-09-28: 1000 mL

## 2018-09-28 MED ORDER — ONDANSETRON HCL 4 MG/2ML IJ SOLN
INTRAMUSCULAR | Status: DC | PRN
  Administered 2018-09-28: 4 mg via INTRAVENOUS

## 2018-09-28 MED ORDER — CHLORHEXIDINE GLUCONATE 4 % EX LIQD: 60.0000 mL | Freq: Once | CUTANEOUS | Status: DC

## 2018-09-28 SURGICAL SUPPLY — 34 items
APL SKNCLS STERI-STRIP NONHPOA (GAUZE/BANDAGES/DRESSINGS) ×1
BENZOIN TINCTURE PRP APPL 2/3 (GAUZE/BANDAGES/DRESSINGS) ×2 IMPLANT
BLADE SAW SGTL HD 18.5X60.5X1. (BLADE) ×2 IMPLANT
BLADE SURG 21 STRL SS (BLADE) ×2 IMPLANT
BNDG COHESIVE 4X5 TAN STRL (GAUZE/BANDAGES/DRESSINGS) ×2 IMPLANT
BNDG GAUZE ELAST 4 BULKY (GAUZE/BANDAGES/DRESSINGS) IMPLANT
COVER SURGICAL LIGHT HANDLE (MISCELLANEOUS) ×2 IMPLANT
COVER WAND RF STERILE (DRAPES) ×2 IMPLANT
DRAPE INCISE IOBAN 66X45 STRL (DRAPES) ×2 IMPLANT
DRAPE U-SHAPE 47X51 STRL (DRAPES) ×2 IMPLANT
DRSG ADAPTIC 3X8 NADH LF (GAUZE/BANDAGES/DRESSINGS) IMPLANT
DRSG PAD ABDOMINAL 8X10 ST (GAUZE/BANDAGES/DRESSINGS) IMPLANT
DURAPREP 26ML APPLICATOR (WOUND CARE) ×2 IMPLANT
ELECT REM PT RETURN 9FT ADLT (ELECTROSURGICAL) ×2
ELECTRODE REM PT RTRN 9FT ADLT (ELECTROSURGICAL) ×1 IMPLANT
GAUZE SPONGE 4X4 12PLY STRL (GAUZE/BANDAGES/DRESSINGS) IMPLANT
GLOVE BIOGEL PI IND STRL 9 (GLOVE) ×1 IMPLANT
GLOVE BIOGEL PI INDICATOR 9 (GLOVE) ×1
GLOVE SURG ORTHO 9.0 STRL STRW (GLOVE) ×2 IMPLANT
GOWN STRL REUS W/ TWL XL LVL3 (GOWN DISPOSABLE) ×3 IMPLANT
GOWN STRL REUS W/TWL XL LVL3 (GOWN DISPOSABLE) ×6
KIT BASIN OR (CUSTOM PROCEDURE TRAY) ×2 IMPLANT
KIT TURNOVER KIT B (KITS) ×2 IMPLANT
NS IRRIG 1000ML POUR BTL (IV SOLUTION) ×2 IMPLANT
PACK ORTHO EXTREMITY (CUSTOM PROCEDURE TRAY) ×2 IMPLANT
PAD ARMBOARD 7.5X6 YLW CONV (MISCELLANEOUS) ×4 IMPLANT
SPONGE LAP 18X18 RF (DISPOSABLE) IMPLANT
SUT ETHILON 2 0 PSLX (SUTURE) ×4 IMPLANT
SUT VIC AB 2-0 CTB1 (SUTURE) IMPLANT
TOWEL OR 17X24 6PK STRL BLUE (TOWEL DISPOSABLE) ×2 IMPLANT
TOWEL OR 17X26 10 PK STRL BLUE (TOWEL DISPOSABLE) ×2 IMPLANT
TUBE CONNECTING 12X1/4 (SUCTIONS) ×2 IMPLANT
WATER STERILE IRR 1000ML POUR (IV SOLUTION) ×2 IMPLANT
YANKAUER SUCT BULB TIP NO VENT (SUCTIONS) ×2 IMPLANT

## 2018-09-28 NOTE — H&P (Signed)
Cory Palmer is an 58 y.o. male.   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  He developed sausage digit swelling of the fourth toe with exposed bone and drainage.  Radiograph showed chronic osteomyelitis.  He is being admitted for a left transmetatarsal amputation today.  Past Medical History:  Diagnosis Date  . Anxiety   . CHF (congestive heart failure) (HCC)   . Depressed bipolar disorder (HCC)   . Diabetes mellitus without complication (HCC)   . Hypertension     Past Surgical History:  Procedure Laterality Date  . BELOW KNEE LEG AMPUTATION Right     Family History  Problem Relation Age of Onset  . Hypertension Other   . Diabetes Mellitus II Other    Social History:  reports that he has been smoking cigarettes. He has never used smokeless tobacco. He reports current alcohol use. He reports previous drug use.  Allergies: No Known Allergies  Medications Prior to Admission  Medication Sig Dispense Refill  . acetaminophen (TYLENOL) 325 MG tablet Take 2 tablets (650 mg total) by mouth every 6 (six) hours as needed for mild pain (or Fever >/= 101). 15 tablet 0  . Amino Acids-Protein Hydrolys (FEEDING SUPPLEMENT, PRO-STAT SUGAR FREE 64,) LIQD Take 30 mLs by mouth 2 (two) times daily. 900 mL 0  . doxycycline (VIBRAMYCIN) 100 MG capsule Take 1 capsule (100 mg total) by mouth 2 (two) times daily. 14 capsule 0  . feeding supplement, GLUCERNA SHAKE, (GLUCERNA SHAKE) LIQD Take 237 mLs by mouth 2 (two) times daily between meals.  0  . metFORMIN (GLUCOPHAGE) 1000 MG tablet Take 1 tablet (1,000 mg total) by mouth 2 (two) times daily with a meal. 60 tablet 1  . Multiple Vitamin (MULTIVITAMIN WITH MINERALS) TABS tablet Take 1 tablet by mouth daily. 30 tablet 0  . nicotine (NICODERM CQ - DOSED IN MG/24 HOURS) 21  mg/24hr patch Place 1 patch (21 mg total) onto the skin daily. 28 patch 0  . QUEtiapine (SEROQUEL) 400 MG tablet Take 1 tablet (400 mg total) by mouth at bedtime. 30 tablet 0  . traZODone (DESYREL) 100 MG tablet Take 1 tablet (100 mg total) by mouth at bedtime. 30 tablet 0    Results for orders placed or performed during the hospital encounter of 09/28/18 (from the past 48 hour(s))  Glucose, capillary     Status: None   Collection Time: 09/28/18  7:07 AM  Result Value Ref Range   Glucose-Capillary 87 70 - 99 mg/dL   Comment 1 Notify RN    Comment 2 Document in Chart    No results found.  Review of Systems  All other systems reviewed and are negative.   Blood pressure 137/78, pulse 69, temperature 98.3 F (36.8 C), temperature source Oral, resp. rate 18, height 6\' 4"  (1.93 m), weight 113.4 kg, SpO2 98 %. Physical Exam  Constitutional: He is oriented to person, place, and time. He appears well-developed and well-nourished. No distress.  HENT:  Head: Normocephalic and atraumatic.  Neck: No tracheal deviation present. No thyromegaly present.  Cardiovascular: Normal rate.  Respiratory: Effort normal. No stridor. No respiratory distress.  GI: Soft. He exhibits no distension.  Musculoskeletal:     Comments: Examination left foot patient has a good dorsalis pedis pulse he does have venous stasis swelling of the entire left lower extremity  he has healed ulcers.  He has sausage digit swelling of the fourth toe with exposed bone and drainage.    Examination the right transtibial amputation is without ulcers or signs of infection or cellulitis.   Neurological: He is alert and oriented to person, place, and time.  Skin: Skin is warm. There is erythema (left 4th toe).  Psychiatric: He has a normal mood and affect. His behavior is normal. Judgment and thought content normal.     Assessment/Plan Chronic osteomyelitis of left 4th toe- Plan left transmetatarsal amputation-the procedure and  possible risks and benefits were discussed with the patient including the risks of bleeding, infection, neurovascular injury, possible need for further surgery were discussed at length with the patient.  The patient's questions were answered to his satisfaction and he wishes to proceed at this time.  Lazaro Arms, PA-C 09/28/2018, 7:14 AM [facility] [phone]

## 2018-09-28 NOTE — Progress Notes (Addendum)
PT Cancellation Note  Patient Details Name: Cory Palmer MRN: [id number] DOB: [birth year]   Cancelled Treatment:    Reason Eval/Treat Not Completed: Patient declined, no reason specified. Patient refusing all therapy at this time. Attempted to educate about therapy's role given new weight bearing changes. However, patient continued to adamantly refuse. States "I'm not doing it, and I'm not going to a rehab facility." Patient also requesting for therapy to sign off. Notified physician. Will await further instruction from MD and follow up as appropriate.   Vanessa Ralphs, SPT  Vanessa Ralphs 09/28/2018, 4:07 PM

## 2018-09-28 NOTE — Anesthesia Postprocedure Evaluation (Signed)
Anesthesia Post Note  Patient: Cory Palmer  Procedure(Palmer) Performed: LEFT TRANSMETATARSAL AMPUTATION (Left )     Patient location during evaluation: PACU Anesthesia Type: General Level of consciousness: awake and alert Pain management: pain level controlled Vital Signs Assessment: post-procedure vital signs reviewed and stable Respiratory status: spontaneous breathing, nonlabored ventilation, respiratory function stable and patient connected to nasal cannula oxygen Cardiovascular status: blood pressure returned to baseline and stable Postop Assessment: no apparent nausea or vomiting Anesthetic complications: no    Last Vitals:  Vitals:   09/28/18 0950 09/28/18 1024  BP: 135/83 127/84  Pulse: 63 62  Resp: 17 16  Temp: 37 C (!) 36.4 C  SpO2: 99% 100%    Last Pain:  Vitals:   09/28/18 1024  TempSrc: Oral  PainSc:                  Cory Palmer

## 2018-09-28 NOTE — Op Note (Signed)
09/28/2018  8:53 AM  PATIENT:  Cory Palmer    PRE-OPERATIVE DIAGNOSIS:  Osteomyelitis Left 4th Toe  POST-OPERATIVE DIAGNOSIS:  Same  PROCEDURE:  LEFT TRANSMETATARSAL AMPUTATION Application of Praveena wound VAC  SURGEON:  Nadara Mustard, MD  PHYSICIAN ASSISTANT:None ANESTHESIA:   General  PREOPERATIVE INDICATIONS:  Cory Palmer is a  58 y.o. male with a diagnosis of Osteomyelitis Left 4th Toe who failed conservative measures and elected for surgical management.    The risks benefits and alternatives were discussed with the patient preoperatively including but not limited to the risks of infection, bleeding, nerve injury, cardiopulmonary complications, the need for revision surgery, among others, and the patient was willing to proceed.  OPERATIVE IMPLANTS: Praveena wound VAC  @ENCIMAGES @  OPERATIVE FINDINGS: Good petechial bleeding: No abscess or infection at the level of amputation.  OPERATIVE PROCEDURE: Patient was brought to the operating room underwent a general anesthetic.  After adequate levels anesthesia were obtained patient's left lower extremity was prepped using ChloraPrep draped into a sterile field a timeout was called.  A fishmouth incision was made just distal to the MTP joint.  A transmetatarsal amputation was performed.  A oscillating saw was used to resect the metatarsal heads 234 and 5.  1 electrocautery was used for hemostasis the wound was irrigated with normal saline.  The incision was closed using 2-0 nylon and a Praveena wound VAC was applied.  Patient was extubated taken the PACU in stable condition.   DISCHARGE PLANNING:  Antibiotic duration: 24 hours  Weightbearing: Touchdown weightbearing on the left patient has a transtibial amputation on the right  Pain medication: Opioid pathway  Dressing care/ Wound VAC: Wound VAC for 1 week  Ambulatory devices: Crutches or walker  Discharge to: Home after 24 hours  Follow-up: In the office 1 week post  operative.

## 2018-09-28 NOTE — Anesthesia Preprocedure Evaluation (Signed)
Anesthesia Evaluation  Patient identified by MRN, date of birth, ID band Patient awake    Reviewed: Allergy & Precautions, H&P , NPO status , Patient's Chart, lab work & pertinent test results  Airway Mallampati: II   Neck ROM: full    Dental   Pulmonary Current Smoker,    breath sounds clear to auscultation       Cardiovascular hypertension, +CHF   Rhythm:regular Rate:Normal  TTE (05/2018): EF 55-60%   Neuro/Psych PSYCHIATRIC DISORDERS Anxiety Bipolar Disorder    GI/Hepatic   Endo/Other  diabetes, Type 2obese  Renal/GU      Musculoskeletal   Abdominal   Peds  Hematology   Anesthesia Other Findings   Reproductive/Obstetrics                             Anesthesia Physical Anesthesia Plan  ASA: II  Anesthesia Plan: General   Post-op Pain Management:    Induction: Intravenous  PONV Risk Score and Plan: 1 and Ondansetron, Dexamethasone, Midazolam and Treatment may vary due to age or medical condition  Airway Management Planned: LMA  Additional Equipment:   Intra-op Plan:   Post-operative Plan:   Informed Consent: I have reviewed the patients History and Physical, chart, labs and discussed the procedure including the risks, benefits and alternatives for the proposed anesthesia with the patient or authorized representative who has indicated his/her understanding and acceptance.       Plan Discussed with: CRNA, Anesthesiologist and Surgeon  Anesthesia Plan Comments:         Anesthesia Quick Evaluation

## 2018-09-28 NOTE — Anesthesia Procedure Notes (Signed)
Procedure Name: LMA Insertion Date/Time: 09/28/2018 8:32 AM Performed by: Marny Lowenstein, CRNA Pre-anesthesia Checklist: Patient identified, Emergency Drugs available, Suction available and Patient being monitored Patient Re-evaluated:Patient Re-evaluated prior to induction Oxygen Delivery Method: Circle system utilized Preoxygenation: Pre-oxygenation with 100% oxygen Induction Type: IV induction Ventilation: Mask ventilation without difficulty LMA: LMA inserted LMA Size: 4.0 Placement Confirmation: positive ETCO2 and breath sounds checked- equal and bilateral Tube secured with: Tape Dental Injury: Teeth and Oropharynx as per pre-operative assessment

## 2018-09-28 NOTE — Plan of Care (Signed)

## 2018-09-28 NOTE — Transfer of Care (Signed)
Immediate Anesthesia Transfer of Care Note  Patient: Cory Palmer  Procedure(s) Performed: LEFT TRANSMETATARSAL AMPUTATION (Left )  Patient Location: PACU  Anesthesia Type:General  Level of Consciousness: awake, alert  and oriented  Airway & Oxygen Therapy: Patient Spontanous Breathing and Patient connected to nasal cannula oxygen  Post-op Assessment: Report given to RN and Post -op Vital signs reviewed and stable  Post vital signs: Reviewed and stable  Last Vitals:  Vitals Value Taken Time  BP 112/74 09/28/2018  9:03 AM  Temp    Pulse 72 09/28/2018  9:04 AM  Resp 15 09/28/2018  9:04 AM  SpO2 98 % 09/28/2018  9:04 AM  Vitals shown include unvalidated device data.  Last Pain:  Vitals:   09/28/18 0704  TempSrc: Oral         Complications: No apparent anesthesia complications

## 2018-09-29 ENCOUNTER — Encounter (HOSPITAL_COMMUNITY): Payer: Self-pay | Admitting: Orthopedic Surgery

## 2018-09-29 LAB — GLUCOSE, CAPILLARY
Glucose-Capillary: 83 mg/dL (ref 70–99)
Glucose-Capillary: 87 mg/dL (ref 70–99)

## 2018-09-29 NOTE — Discharge Summary (Signed)
Discharge Diagnoses:  Active Problems:   Osteomyelitis of toe of left foot (HCC)   S/P transmetatarsal amputation of foot, left (HCC)   Surgeries: Procedure(s): LEFT TRANSMETATARSAL AMPUTATION on 09/28/2018    Consultants:   Discharged Condition: Improved  Hospital Course: Cory Palmer is an 58 y.o. male who was admitted 09/28/2018 with a chief complaint of osteomyelitis of left foot, with a final diagnosis of Osteomyelitis Left 4th Toe.  Patient was brought to the operating room on 09/28/2018 and underwent Procedure(s): LEFT TRANSMETATARSAL AMPUTATION.    Patient was given perioperative antibiotics:  Anti-infectives (From admission, onward)   Start     Dose/Rate Route Frequency Ordered Stop   09/28/18 0800  ceFAZolin (ANCEF) 3 g in dextrose 5 % 50 mL IVPB     3 g 100 mL/hr over 30 Minutes Intravenous To [facility] 09/27/18 1328 09/28/18 1941    .  Patient was given sequential compression devices, early ambulation, and aspirin for DVT prophylaxis.  Recent vital signs:  Patient Vitals for the past 24 hrs:  BP Temp Temp src Pulse Resp SpO2  09/29/18 0821 108/65 97.6 F (36.4 C) Oral (!) 55 16 97 %  09/29/18 0429 97/66 97.8 F (36.6 C) Oral (!) 56 18 97 %  09/28/18 2348 99/68 97.8 F (36.6 C) Oral (!) 55 16 95 %  09/28/18 2001 114/67 98.8 F (37.1 C) Oral 61 16 96 %  09/28/18 1024 127/84 (!) 97.5 F (36.4 C) Oral 62 16 100 %  09/28/18 0950 135/83 98.6 F (37 C) - 63 17 99 %  09/28/18 0935 125/65 - - 65 13 99 %  09/28/18 0920 128/76 - - 68 15 97 %  09/28/18 0905 112/74 97.7 F (36.5 C) - 72 15 98 %  .  Recent laboratory studies: No results found.  Discharge Medications:   Allergies as of 09/29/2018   No Known Allergies     Medication List    STOP taking these medications   feeding supplement (PRO-STAT SUGAR FREE 64) Liqd   nicotine 21 mg/24hr patch Commonly known as:  NICODERM CQ - dosed in mg/24 hours     TAKE these medications   acetaminophen 325 MG  tablet Commonly known as:  TYLENOL Take 2 tablets (650 mg total) by mouth every 6 (six) hours as needed for mild pain (or Fever >/= 101).   doxycycline 100 MG capsule Commonly known as:  VIBRAMYCIN Take 1 capsule (100 mg total) by mouth 2 (two) times daily.   feeding supplement (GLUCERNA SHAKE) Liqd Take 237 mLs by mouth 2 (two) times daily between meals.   metFORMIN 1000 MG tablet Commonly known as:  GLUCOPHAGE Take 1 tablet (1,000 mg total) by mouth 2 (two) times daily with a meal.   multivitamin with minerals Tabs tablet Take 1 tablet by mouth daily.   QUEtiapine 400 MG tablet Commonly known as:  SEROQUEL Take 1 tablet (400 mg total) by mouth at bedtime.   traZODone 100 MG tablet Commonly known as:  DESYREL Take 1 tablet (100 mg total) by mouth at bedtime.       Diagnostic Studies: Xr Foot Complete Left  Result Date: 09/24/2018 3 view radiographs of the left foot shows a previous ray amputation of the first second and third metatarsals.  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  There are multiple retained needles within the soft tissue.   Patient benefited maximally from their  hospital stay and there were no complications.     Disposition: Discharge disposition: 01-Home or Self Care      Discharge Instructions    Call MD / Call 911   Complete by:  As directed    If you experience chest pain or shortness of breath, CALL 911 and be transported to the hospital emergency room.  If you develope a fever above 101 F, pus (white drainage) or increased drainage or redness at the wound, or calf pain, call your surgeon's office.   Constipation Prevention   Complete by:  As directed    Drink plenty of fluids.  Prune juice may be helpful.  You may use a stool softener, such as Colace (over the counter) 100 mg twice a day.  Use MiraLax (over the counter) for constipation as needed.   Diet - low sodium  heart healthy   Complete by:  As directed    Increase activity slowly as tolerated   Complete by:  As directed      Follow-up Information    Nadara Mustard, MD In 1 week.   Specialty:  Orthopedic Surgery Contact information: [address] [place] [address] [phone]            Signed: Lazaro Arms, Cordelia Poche 09/29/2018, 9:01 AM [facility] [phone]

## 2018-09-29 NOTE — Discharge Instructions (Signed)
Elevate left foot as much as possible and avoid putting weight on foot except through the heel when necessary for balance.   Keep Prevena VAC machine plugged into wall outlet to keep machine charged.   Do not remove dressings from left foot.  Call to make appointment to the seen in the office next Thursday [phone]

## 2018-09-29 NOTE — Progress Notes (Signed)
Subjective: 1 Day Post-Op Procedure(s) (LRB): LEFT TRANSMETATARSAL AMPUTATION (Left) Patient reports pain as mild.   Refusing PT and offered to obtain walker or crutches for ambulation, but  patient reports he will walk with his right BKA prosthesis and cane after discharge. Reinforced weight bearing through the left heel only when necessary at home. Reinforced elevation of the left foot as much as possible. Taking only tylenol for pain and does not want anything stronger.  Objective: Vital signs in last 24 hours: Temp:  [97.5 F (36.4 C)-98.8 F (37.1 C)] 97.6 F (36.4 C) (02/29 0821) Pulse Rate:  [55-72] 55 (02/29 0821) Resp:  [13-18] 16 (02/29 0821) BP: (97-135)/(65-84) 108/65 (02/29 0821) SpO2:  [95 %-100 %] 97 % (02/29 0821)  Intake/Output from previous day: 02/28 0701 - 02/29 0700 In: 1450 [P.O.:500; I.V.:950] Out: 2321 [Urine:2306; Blood:15] Intake/Output this shift: Total I/O In: -  Out: 400 [Urine:350; Drains:50]  Recent Labs    09/28/18 0719  HGB 12.7*   Recent Labs    09/28/18 0719  WBC 6.8  RBC 3.97*  HCT 38.8*  PLT 85*   Recent Labs    09/28/18 0719  NA 134*  K 3.8  CL 106  CO2 16*  BUN 8  CREATININE 0.78  GLUCOSE 102*  CALCIUM 8.9   No results for input(s): LABPT, INR in the last 72 hours.  Left foot with VAC dressing in place and functioning well. Scant bloody drainage in the VAC canister.    Assessment/Plan: 1 Day Post-Op Procedure(s) (LRB): LEFT TRANSMETATARSAL AMPUTATION (Left)  DC to home today.  Reinforced weight bearing through the left heel only when necessary at home. Reinforced elevation of the left foot as much as possible. Taking only tylenol for pain and does not want anything stronger.  Reinforced to plug VAC machine into wall outlet.  Lazaro Arms, PA-C 09/29/2018, 8:53 AM  [facility] [phone]

## 2018-10-01 ENCOUNTER — Ambulatory Visit (INDEPENDENT_AMBULATORY_CARE_PROVIDER_SITE_OTHER): Payer: Medicare Other | Admitting: Orthopedic Surgery

## 2018-10-01 ENCOUNTER — Telehealth (INDEPENDENT_AMBULATORY_CARE_PROVIDER_SITE_OTHER): Payer: Self-pay

## 2018-10-01 ENCOUNTER — Encounter (INDEPENDENT_AMBULATORY_CARE_PROVIDER_SITE_OTHER): Payer: Self-pay | Admitting: Orthopedic Surgery

## 2018-10-01 VITALS — Ht 76.0 in | Wt 250.0 lb

## 2018-10-01 DIAGNOSIS — Z89511 Acquired absence of right leg below knee: Secondary | ICD-10-CM

## 2018-10-01 DIAGNOSIS — Z89432 Acquired absence of left foot: Secondary | ICD-10-CM

## 2018-10-01 NOTE — Telephone Encounter (Signed)
FYI Patient called stating that his wound vac from surgery on Friday was full.  I advised needed to come in for an appointment. He said that he would have to figure out transportation and call us back.

## 2018-10-01 NOTE — Telephone Encounter (Signed)
Noted  

## 2018-10-01 NOTE — Telephone Encounter (Signed)
Pt  Has an appt for today he states that if he can not come in today then he will come in tomorrow.

## 2018-10-01 NOTE — Progress Notes (Signed)
Office Visit Note   Patient: Cory Palmer           Date of Birth: Apr 25, 1961           MRN: [id number] Visit Date: 10/01/2018              Requested by: No referring provider defined for this encounter. PCP: Patient, [name] Per  Chief Complaint  Patient presents with  . Left Foot - Routine Post Op    09/28/2018 left transmet amputation       HPI: Patient is a 58 year old gentleman who is status post transmetatarsal amputation of the left he is status post below the knee amputation on the right.  Patient presents stating that the wound VAC canister is full patient is currently full weightbearing without shoes on.  Assessment & Plan: Visit Diagnoses:  1. S/P below knee amputation, right (HCC)   2. S/P transmetatarsal amputation of foot, left (HCC)     Plan: Again discussed the importance of minimizing his activities minimizing his weightbearing for the left foot.  Patient states that he does fine with a cane he does not want to use a walker.  He will start Dial soap cleansing tomorrow dry dressing changes follow-up in 1 week.  Follow-Up Instructions: Return in about 1 week (around 10/08/2018).   Ortho Exam  Patient is alert, oriented, no adenopathy, well-dressed, normal affect, normal respiratory effort. Examination patient is full weightbearing on the left with a cane.  The wound edges are macerated there is swelling the wound is well approximated without any wound dehiscence there is no gangrenous changes.  There is no cellulitis.  Imaging: No results found. No images are attached to the encounter.  Labs: Lab Results  Component Value Date   HGBA1C 6.6 (H) 09/28/2018   HGBA1C 7.5 (H) 05/08/2018   ESRSEDRATE 16 05/07/2018   CRP <0.8 05/07/2018   REPTSTATUS 09/01/2018 FINAL 08/27/2018   CULT NO GROWTH 5 DAYS 08/27/2018     Lab Results  Component Value Date   ALBUMIN 2.9 (L) 08/27/2018   ALBUMIN 3.5 05/08/2018   PREALBUMIN 11.2 (L) 05/08/2018    Body mass  index is 30.43 kg/m.  Orders:  No orders of the defined types were placed in this encounter.  No orders of the defined types were placed in this encounter.    Procedures: No procedures performed  Clinical Data: No additional findings.  ROS:  All other systems negative, except as noted in the HPI. Review of Systems  Objective: Vital Signs: Ht 6\' 4"  (1.93 m)   Wt 250 lb (113.4 kg)   BMI 30.43 kg/m   Specialty Comments:  No specialty comments available.  PMFS History: Patient Active Problem List   Diagnosis Date Noted  . S/P transmetatarsal amputation of foot, 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  . Osteomyelitis of toe of left foot (HCC)   . Wound infection 05/07/2018  . Homelessness 05/07/2018  . DM (diabetes mellitus), type 2 with complications (HCC) 05/07/2018  . Acute osteomyelitis involving lower leg, right (HCC) 05/07/2018   Past Medical History:  Diagnosis Date  . Anxiety   . CHF (congestive heart failure) (HCC)   . Depressed bipolar disorder (HCC)   . Diabetes mellitus without complication (HCC)   . Hypertension     Family History  Problem Relation Age of Onset  . Hypertension Other   . Diabetes Mellitus II Other     Past Surgical History:  Procedure Laterality Date  . AMPUTATION Left 09/28/2018  Procedure: LEFT TRANSMETATARSAL AMPUTATION;  Surgeon: Nadara Mustard, MD;  Location: [facility] OR;  Service: Orthopedics;  Laterality: Left;  . BELOW KNEE LEG AMPUTATION Right    Social History   Occupational History  . Not on file  Tobacco Use  . Smoking status: Current Every Day Smoker    Types: Cigarettes  . Smokeless tobacco: Never Used  Substance and Sexual Activity  . Alcohol use: Yes    Comment: occasional  . Drug use: Not Currently  . Sexual activity: Not on file

## 2018-10-02 ENCOUNTER — Inpatient Hospital Stay (INDEPENDENT_AMBULATORY_CARE_PROVIDER_SITE_OTHER): Payer: Medicare Other | Admitting: Physician Assistant

## 2018-10-04 ENCOUNTER — Emergency Department (HOSPITAL_COMMUNITY): Payer: Medicare Other

## 2018-10-04 ENCOUNTER — Other Ambulatory Visit: Payer: Self-pay

## 2018-10-04 ENCOUNTER — Inpatient Hospital Stay (HOSPITAL_COMMUNITY)
Admission: EM | Admit: 2018-10-04 | Discharge: 2018-10-18 | DRG: 564 | Disposition: A | Discharge status: Home Health | Payer: Medicare Other | Attending: Internal Medicine | Admitting: Internal Medicine

## 2018-10-04 DIAGNOSIS — Z5900 Homelessness unspecified: Secondary | ICD-10-CM

## 2018-10-04 DIAGNOSIS — T8744 Infection of amputation stump, left lower extremity: Secondary | ICD-10-CM | POA: Diagnosis not present

## 2018-10-04 DIAGNOSIS — Z8249 Family history of ischemic heart disease and other diseases of the circulatory system: Secondary | ICD-10-CM

## 2018-10-04 DIAGNOSIS — A4101 Sepsis due to Methicillin susceptible Staphylococcus aureus: Secondary | ICD-10-CM | POA: Diagnosis present

## 2018-10-04 DIAGNOSIS — E119 Type 2 diabetes mellitus without complications: Secondary | ICD-10-CM

## 2018-10-04 DIAGNOSIS — R7881 Bacteremia: Secondary | ICD-10-CM

## 2018-10-04 DIAGNOSIS — D696 Thrombocytopenia, unspecified: Secondary | ICD-10-CM | POA: Diagnosis present

## 2018-10-04 DIAGNOSIS — N400 Enlarged prostate without lower urinary tract symptoms: Secondary | ICD-10-CM | POA: Diagnosis present

## 2018-10-04 DIAGNOSIS — A419 Sepsis, unspecified organism: Secondary | ICD-10-CM

## 2018-10-04 DIAGNOSIS — I10 Essential (primary) hypertension: Secondary | ICD-10-CM | POA: Diagnosis present

## 2018-10-04 DIAGNOSIS — Z89432 Acquired absence of left foot: Secondary | ICD-10-CM

## 2018-10-04 DIAGNOSIS — K5900 Constipation, unspecified: Secondary | ICD-10-CM | POA: Diagnosis not present

## 2018-10-04 DIAGNOSIS — E876 Hypokalemia: Secondary | ICD-10-CM

## 2018-10-04 DIAGNOSIS — F3130 Bipolar disorder, current episode depressed, mild or moderate severity, unspecified: Secondary | ICD-10-CM | POA: Diagnosis present

## 2018-10-04 DIAGNOSIS — F17210 Nicotine dependence, cigarettes, uncomplicated: Secondary | ICD-10-CM | POA: Diagnosis present

## 2018-10-04 DIAGNOSIS — F1410 Cocaine abuse, uncomplicated: Secondary | ICD-10-CM | POA: Diagnosis present

## 2018-10-04 DIAGNOSIS — Z89511 Acquired absence of right leg below knee: Secondary | ICD-10-CM

## 2018-10-04 DIAGNOSIS — F319 Bipolar disorder, unspecified: Secondary | ICD-10-CM | POA: Diagnosis present

## 2018-10-04 DIAGNOSIS — D649 Anemia, unspecified: Secondary | ICD-10-CM | POA: Diagnosis present

## 2018-10-04 DIAGNOSIS — I480 Paroxysmal atrial fibrillation: Secondary | ICD-10-CM

## 2018-10-04 DIAGNOSIS — Z7984 Long term (current) use of oral hypoglycemic drugs: Secondary | ICD-10-CM

## 2018-10-04 DIAGNOSIS — R45851 Suicidal ideations: Secondary | ICD-10-CM | POA: Diagnosis present

## 2018-10-04 DIAGNOSIS — R17 Unspecified jaundice: Secondary | ICD-10-CM | POA: Diagnosis present

## 2018-10-04 DIAGNOSIS — Z833 Family history of diabetes mellitus: Secondary | ICD-10-CM

## 2018-10-04 LAB — COMPREHENSIVE METABOLIC PANEL
ALT: 14 U/L (ref 0–44)
AST: 28 U/L (ref 15–41)
Albumin: 3.7 g/dL (ref 3.5–5.0)
Alkaline Phosphatase: 112 U/L (ref 38–126)
Anion gap: 9 (ref 5–15)
BUN: 14 mg/dL (ref 6–20)
CO2: 18 mmol/L — ABNORMAL LOW (ref 22–32)
Calcium: 8.5 mg/dL — ABNORMAL LOW (ref 8.9–10.3)
Chloride: 108 mmol/L (ref 98–111)
Creatinine, Ser: 0.96 mg/dL (ref 0.61–1.24)
GFR calc Af Amer: >60 mL/min (ref >60)
GFR calc non Af Amer: >60 mL/min (ref >60)
Glucose, Bld: 220 mg/dL — ABNORMAL HIGH (ref 70–99)
Potassium: 3.4 mmol/L — ABNORMAL LOW (ref 3.5–5.1)
Sodium: 135 mmol/L (ref 135–145)
Total Bilirubin: 5.2 mg/dL — ABNORMAL HIGH (ref 0.3–1.2)
Total Protein: 7.2 g/dL (ref 6.5–8.1)

## 2018-10-04 LAB — CBC WITH DIFFERENTIAL/PLATELET
Abs Immature Granulocytes: 0.14 10*3/uL — ABNORMAL HIGH (ref 0.00–0.07)
Basophils Absolute: 0 10*3/uL (ref 0.0–0.1)
Basophils Relative: 0 %
Eosinophils Absolute: 0.1 10*3/uL (ref 0.0–0.5)
Eosinophils Relative: 1 %
HCT: 36.4 % — ABNORMAL LOW (ref 39.0–52.0)
Hemoglobin: 12.4 g/dL — ABNORMAL LOW (ref 13.0–17.0)
Immature Granulocytes: 1 %
Lymphocytes Relative: 7 %
Lymphs Abs: 1.3 10*3/uL (ref 0.7–4.0)
MCH: 32 pg (ref 26.0–34.0)
MCHC: 34.1 g/dL (ref 30.0–36.0)
MCV: 93.8 fL (ref 80.0–100.0)
Monocytes Absolute: 1.5 10*3/uL — ABNORMAL HIGH (ref 0.1–1.0)
Monocytes Relative: 7 %
Neutro Abs: 16.7 10*3/uL — ABNORMAL HIGH (ref 1.7–7.7)
Neutrophils Relative %: 84 %
Platelets: 182 10*3/uL (ref 150–400)
RBC: 3.88 MIL/uL — ABNORMAL LOW (ref 4.22–5.81)
RDW: 13.7 % (ref 11.5–15.5)
WBC: 19.7 10*3/uL — ABNORMAL HIGH (ref 4.0–10.5)
nRBC: 0 % (ref 0.0–0.2)

## 2018-10-04 LAB — LACTIC ACID, PLASMA: Lactic Acid, Venous: 2.6 mmol/L (ref 0.5–1.9)

## 2018-10-04 LAB — I-STAT TROPONIN, ED: TROPONIN I, POC: 0 ng/mL (ref 0.00–0.08)

## 2018-10-04 LAB — LIPASE, BLOOD: Lipase: 21 U/L (ref 11–51)

## 2018-10-04 MED ORDER — VANCOMYCIN HCL 10 G IV SOLR
1500.0000 mg | Freq: Two times a day (BID) | INTRAVENOUS | Status: DC
  Administered 2018-10-05: 1500 mg via INTRAVENOUS
  Filled 2018-10-04 (×2): qty 1500

## 2018-10-04 MED ORDER — SODIUM CHLORIDE 0.9 % IV SOLN
1.0000 g | Freq: Three times a day (TID) | INTRAVENOUS | Status: DC
  Administered 2018-10-05: 1 g via INTRAVENOUS
  Filled 2018-10-04 (×3): qty 1

## 2018-10-04 MED ORDER — VANCOMYCIN HCL IN DEXTROSE 1-5 GM/200ML-% IV SOLN: 1000.0000 mg | Freq: Once | INTRAVENOUS | Status: DC

## 2018-10-04 MED ORDER — SODIUM CHLORIDE 0.9 % IV SOLN
Freq: Once | INTRAVENOUS | Status: AC
  Administered 2018-10-04: 23:00:00 via INTRAVENOUS

## 2018-10-04 MED ORDER — VANCOMYCIN HCL 10 G IV SOLR
2000.0000 mg | Freq: Once | INTRAVENOUS | Status: AC
  Administered 2018-10-04: 2000 mg via INTRAVENOUS
  Filled 2018-10-04: qty 2000

## 2018-10-04 MED ORDER — SODIUM CHLORIDE 0.9 % IV SOLN
2.0000 g | Freq: Once | INTRAVENOUS | Status: AC
  Administered 2018-10-04: 2 g via INTRAVENOUS
  Filled 2018-10-04: qty 2

## 2018-10-04 MED ORDER — SODIUM CHLORIDE 0.9 % IV BOLUS
1000.0000 mL | Freq: Once | INTRAVENOUS | Status: AC
  Administered 2018-10-04: 1000 mL via INTRAVENOUS

## 2018-10-04 MED ORDER — METRONIDAZOLE IN NACL 5-0.79 MG/ML-% IV SOLN
500.0000 mg | Freq: Three times a day (TID) | INTRAVENOUS | Status: DC
  Administered 2018-10-05 (×2): 500 mg via INTRAVENOUS
  Filled 2018-10-04 (×2): qty 100

## 2018-10-04 NOTE — ED Notes (Signed)
Recollected dark green for istat trop blood test.

## 2018-10-04 NOTE — Progress Notes (Signed)
Pharmacy Antibiotic Note  Cory Palmer is a 58 y.o. male admitted on 10/04/2018 with sepsis.  Pharmacy has been consulted for vancomycin and cefepime dosing. Cefepime 2gm ordered in the ED  Plan: Vancomycin 2gm IV x 1 then 1500 mg IV q12 hours Continue cefepime 1gm IV q8 hours F/u renal function, cultures and clinical course  Height: 5\' 10"  (177.8 cm) Weight: 260 lb (117.9 kg) IBW/kg (Calculated) : 73  Temp (24hrs), Avg:99.7 F (37.6 C), Min:99.7 F (37.6 C), Max:99.7 F (37.6 C)  Recent Labs  Lab 09/28/18 0719 10/04/18 2231  WBC 6.8 19.7*  CREATININE 0.78 0.96  LATICACIDVEN  --  2.6*    Estimated Creatinine Clearance: 108 mL/min (by C-G formula based on SCr of 0.96 mg/dL).    No Known Allergies   Thank you for allowing pharmacy to be a part of this patient's care.  Talbert Cage [place]et 10/04/2018 11:20 PM

## 2018-10-04 NOTE — ED Triage Notes (Signed)
Per ems pt was at home with chest pain which has been going on for a few weeks and gotten worse tonight. Also had surgery on his left foot with his toes amputated and walked to the store last night and has been bleeding every since.n/v.  140/80 99 ra, 258 cbg, alert oriented x 4

## 2018-10-04 NOTE — ED Provider Notes (Addendum)
[facility] EMERGENCY DEPARTMENT Provider Note   CSN: [id number] Arrival date & time: 10/04/18  2202    History   Chief Complaint Chief Complaint  Patient presents with  . Chest Pain    possible sepsis  . Foot Pain    HPI Cory Palmer is a 58 y.o. male with history of hypertension, diabetes, CHF, homelessness, transmetatarsal amputation of left foot on 09/28/2018 who presents with chest pain that has been going on for a few weeks and is gotten worse tonight.  He has had associated shortness of breath, nausea, and vomiting.  Patient reports he has had a fever at home.  Patient has had bleeding from his left foot since he walked to the store last night on his surgical wound.  Patient reports epigastric abdominal pain.  He denies any urinary symptoms.     HPI  Past Medical History:  Diagnosis Date  . Anxiety   . CHF (congestive heart failure) (HCC)   . Depressed bipolar disorder (HCC)   . Diabetes mellitus without complication (HCC)   . Hypertension     Patient Active Problem List   Diagnosis Date Noted  . Hx of BKA, right (HCC)   . PAF (paroxysmal atrial fibrillation) (HCC)   . Bacteremia due to Gram-positive bacteria   . Hypokalemia   . Hypomagnesemia   . Sepsis without acute organ dysfunction (HCC) 10/05/2018  . Depressed bipolar disorder (HCC) 10/05/2018  . Hypertension 10/05/2018  . Hyperbilirubinemia 10/05/2018  . S/P transmetatarsal amputation of foot, 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  . Osteomyelitis of toe of left foot (HCC)   . Wound infection 05/07/2018  . Homelessness 05/07/2018  . Diabetes mellitus type 2, noninsulin dependent (HCC) 05/07/2018  . Acute osteomyelitis involving lower leg, right (HCC) 05/07/2018    Past Surgical History:  Procedure Laterality Date  . AMPUTATION Left 09/28/2018   Procedure: LEFT TRANSMETATARSAL AMPUTATION;  Surgeon: Nadara Mustard, MD;  Location: [facility] OR;  Service: Orthopedics;  Laterality: Left;  . BELOW KNEE LEG  AMPUTATION Right         Home Medications    Prior to Admission medications   Medication Sig Start Date End Date Taking? Authorizing Provider  acetaminophen (TYLENOL) 325 MG tablet Take 2 tablets (650 mg total) by mouth every 6 (six) hours as needed for mild pain (or Fever >/= 101). 05/10/18  Yes Danford, Earl Lites, MD  metFORMIN (GLUCOPHAGE) 1000 MG tablet Take 1 tablet (1,000 mg total) by mouth 2 (two) times daily with a meal. 08/28/18  Yes Ward, Kristen N, DO  QUEtiapine (SEROQUEL) 400 MG tablet Take 1 tablet (400 mg total) by mouth at bedtime. 08/28/18  Yes Ward, Layla Maw, DO  traZODone (DESYREL) 100 MG tablet Take 1 tablet (100 mg total) by mouth at bedtime. 08/28/18  Yes Ward, Layla Maw, DO  doxycycline (VIBRAMYCIN) 100 MG capsule Take 1 capsule (100 mg total) by mouth 2 (two) times daily. Patient not taking: Reported on 10/05/2018 08/28/18   Ward, Layla Maw, DO  feeding supplement, GLUCERNA SHAKE, (GLUCERNA SHAKE) LIQD Take 237 mLs by mouth 2 (two) times daily between meals. Patient not taking: Reported on 10/05/2018 05/10/18   Alberteen Sam, MD  Multiple Vitamin (MULTIVITAMIN WITH MINERALS) TABS tablet Take 1 tablet by mouth daily. Patient not taking: Reported on 10/05/2018 05/11/18   Alberteen Sam, MD    Family History Family History  Problem Relation Age of Onset  . Hypertension Other   . Diabetes Mellitus II Other  Social History Social History   Tobacco Use  . Smoking status: Current Every Day Smoker    Types: Cigarettes  . Smokeless tobacco: Never Used  Substance Use Topics  . Alcohol use: Yes    Comment: occasional  . Drug use: Not Currently     Allergies   Patient has no known allergies.   Review of Systems Review of Systems  Constitutional: Positive for fever. Negative for chills.  HENT: Positive for sore throat. Negative for facial swelling.   Respiratory: Positive for cough (x a few days) and shortness of breath.   Cardiovascular:  Positive for chest pain.  Gastrointestinal: Positive for abdominal pain, nausea and vomiting.  Genitourinary: Negative for dysuria.  Musculoskeletal: Negative for back pain.  Skin: Positive for wound. Negative for rash.  Neurological: Negative for headaches.  Psychiatric/Behavioral: The patient is not nervous/anxious.      Physical Exam Updated Vital Signs BP (!) 83/54   Pulse 71   Temp 97.7 F (36.5 C) (Oral)   Resp 18   Ht  (1.778 m)   Wt 104.5 kg   SpO2 94%   BMI 33.06 kg/m   Physical Exam Vitals signs and nursing note reviewed.  Constitutional:      General: He is not in acute distress.    Appearance: He is well-developed. He is not diaphoretic.  HENT:     Head: Normocephalic and atraumatic.     Mouth/Throat:     Pharynx: No oropharyngeal exudate.  Eyes:     General: No scleral icterus.       Right eye: No discharge.        Left eye: No discharge.     Conjunctiva/sclera: Conjunctivae normal.     Pupils: Pupils are equal, round, and reactive to light.  Neck:     Musculoskeletal: Normal range of motion and neck supple.     Thyroid: No thyromegaly.  Cardiovascular:     Rate and Rhythm: Normal rate and regular rhythm.     Heart sounds: Normal heart sounds. No murmur. No friction rub. No gallop.   Pulmonary:     Effort: Pulmonary effort is normal. No respiratory distress.     Breath sounds: Normal breath sounds. No stridor. No wheezing or rales.  Abdominal:     General: Bowel sounds are normal. There is no distension.     Palpations: Abdomen is soft.     Tenderness: There is abdominal tenderness in the epigastric area. There is no guarding or rebound.  Lymphadenopathy:     Cervical: No cervical adenopathy.  Skin:    General: Skin is warm and dry.     Coloration: Skin is not pale.     Findings: No rash.     Comments: Oozing wound to the left foot amputation site, under bloodsoaked dressing, no erythema or purulent drainage; see photo  Neurological:      Mental Status: He is alert.     Coordination: Coordination normal.          ED Treatments / Results  Labs (all labs ordered are listed, but only abnormal results are displayed) Labs Reviewed  CULTURE, BLOOD (ROUTINE X 2) - Abnormal; Notable for the following components:      Result Value   Culture   (*)    Value: STAPHYLOCOCCUS AUREUS STREPTOCOCCUS GROUP G    All other components within normal limits  BLOOD CULTURE ID PANEL (REFLEXED) - Abnormal; Notable for the following components:   Staphylococcus species DETECTED (*)  Staphylococcus aureus (BCID) DETECTED (*)    Streptococcus species DETECTED (*)    All other components within normal limits  LACTIC ACID, PLASMA - Abnormal; Notable for the following components:   Lactic Acid, Venous 2.6 (*)    All other components within normal limits  COMPREHENSIVE METABOLIC PANEL - Abnormal; Notable for the following components:   Potassium 3.4 (*)    CO2 18 (*)    Glucose, Bld 220 (*)    Calcium 8.5 (*)    Total Bilirubin 5.2 (*)    All other components within normal limits  CBC WITH DIFFERENTIAL/PLATELET - Abnormal; Notable for the following components:   WBC 19.7 (*)    RBC 3.88 (*)    Hemoglobin 12.4 (*)    HCT 36.4 (*)    Neutro Abs 16.7 (*)    Monocytes Absolute 1.5 (*)    Abs Immature Granulocytes 0.14 (*)    All other components within normal limits  CBC WITH DIFFERENTIAL/PLATELET - Abnormal; Notable for the following components:   WBC 17.1 (*)    RBC 3.29 (*)    Hemoglobin 10.4 (*)    HCT 30.9 (*)    Platelets 138 (*)    Neutro Abs 12.5 (*)    Monocytes Absolute 1.9 (*)    Abs Immature Granulocytes 0.12 (*)    All other components within normal limits  COMPREHENSIVE METABOLIC PANEL - Abnormal; Notable for the following components:   Potassium 2.9 (*)    CO2 19 (*)    Glucose, Bld 169 (*)    Calcium 8.2 (*)    Total Protein 6.2 (*)    Albumin 3.1 (*)    Total Bilirubin 3.0 (*)    All other components  within normal limits  BILIRUBIN, DIRECT - Abnormal; Notable for the following components:   Bilirubin, Direct 0.9 (*)    All other components within normal limits  URINALYSIS, ROUTINE W REFLEX MICROSCOPIC - Abnormal; Notable for the following components:   Color, Urine AMBER (*)    Specific Gravity, Urine 1.034 (*)    Bilirubin Urine SMALL (*)    Ketones, ur 5 (*)    Protein, ur 30 (*)    Bacteria, UA RARE (*)    All other components within normal limits  RAPID URINE DRUG SCREEN, HOSP PERFORMED - Abnormal; Notable for the following components:   Cocaine POSITIVE (*)    All other components within normal limits  CBC WITH DIFFERENTIAL/PLATELET - Abnormal; Notable for the following components:   RBC 2.65 (*)    Hemoglobin 8.6 (*)    HCT 24.9 (*)    Platelets 87 (*)    All other components within normal limits  GLUCOSE, CAPILLARY - Abnormal; Notable for the following components:   Glucose-Capillary 143 (*)    All other components within normal limits  BASIC METABOLIC PANEL - Abnormal; Notable for the following components:   Potassium 3.4 (*)    CO2 19 (*)    Glucose, Bld 125 (*)    Calcium 7.8 (*)    All other components within normal limits  MAGNESIUM - Abnormal; Notable for the following components:   Magnesium 1.6 (*)    All other components within normal limits  GLUCOSE, CAPILLARY - Abnormal; Notable for the following components:   Glucose-Capillary 109 (*)    All other components within normal limits  GLUCOSE, CAPILLARY - Abnormal; Notable for the following components:   Glucose-Capillary 134 (*)    All other components within normal limits  GLUCOSE, CAPILLARY - Abnormal; Notable for the following components:   Glucose-Capillary 119 (*)    All other components within normal limits  CBC WITH DIFFERENTIAL/PLATELET - Abnormal; Notable for the following components:   RBC 2.67 (*)    Hemoglobin 8.6 (*)    HCT 25.2 (*)    Platelets 90 (*)    All other components within  normal limits  BASIC METABOLIC PANEL - Abnormal; Notable for the following components:   CO2 18 (*)    Glucose, Bld 216 (*)    Calcium 7.8 (*)    All other components within normal limits  MAGNESIUM - Abnormal; Notable for the following components:   Magnesium 1.5 (*)    All other components within normal limits  GLUCOSE, CAPILLARY - Abnormal; Notable for the following components:   Glucose-Capillary 114 (*)    All other components within normal limits  GLUCOSE, CAPILLARY - Abnormal; Notable for the following components:   Glucose-Capillary 192 (*)    All other components within normal limits  GLUCOSE, CAPILLARY - Abnormal; Notable for the following components:   Glucose-Capillary 125 (*)    All other components within normal limits  CBC WITH DIFFERENTIAL/PLATELET - Abnormal; Notable for the following components:   WBC 3.7 (*)    RBC 2.85 (*)    Hemoglobin 8.9 (*)    HCT 26.7 (*)    Platelets 99 (*)    All other components within normal limits  GLUCOSE, CAPILLARY - Abnormal; Notable for the following components:   Glucose-Capillary 143 (*)    All other components within normal limits  BASIC METABOLIC PANEL - Abnormal; Notable for the following components:   CO2 21 (*)    Glucose, Bld 163 (*)    Calcium 8.0 (*)    All other components within normal limits  HEPATIC FUNCTION PANEL - Abnormal; Notable for the following components:   Total Protein 5.2 (*)    Albumin 2.4 (*)    Bilirubin, Direct 0.3 (*)    All other components within normal limits  GLUCOSE, CAPILLARY - Abnormal; Notable for the following components:   Glucose-Capillary 145 (*)    All other components within normal limits  GLUCOSE, CAPILLARY - Abnormal; Notable for the following components:   Glucose-Capillary 175 (*)    All other components within normal limits  GLUCOSE, CAPILLARY - Abnormal; Notable for the following components:   Glucose-Capillary 115 (*)    All other components within normal limits    GLUCOSE, CAPILLARY - Abnormal; Notable for the following components:   Glucose-Capillary 108 (*)    All other components within normal limits  CBC WITH DIFFERENTIAL/PLATELET - Abnormal; Notable for the following components:   RBC 2.84 (*)    Hemoglobin 9.3 (*)    HCT 26.3 (*)    Platelets 113 (*)    All other components within normal limits  BASIC METABOLIC PANEL - Abnormal; Notable for the following components:   Glucose, Bld 186 (*)    Calcium 8.5 (*)    All other components within normal limits  MAGNESIUM - Abnormal; Notable for the following components:   Magnesium 1.6 (*)    All other components within normal limits  GLUCOSE, CAPILLARY - Abnormal; Notable for the following components:   Glucose-Capillary 154 (*)    All other components within normal limits  GLUCOSE, CAPILLARY - Abnormal; Notable for the following components:   Glucose-Capillary 122 (*)    All other components within normal limits  GLUCOSE, CAPILLARY - Abnormal; Notable  for the following components:   Glucose-Capillary 122 (*)    All other components within normal limits  GLUCOSE, CAPILLARY - Abnormal; Notable for the following components:   Glucose-Capillary 150 (*)    All other components within normal limits  CBC WITH DIFFERENTIAL/PLATELET - Abnormal; Notable for the following components:   RBC 3.14 (*)    Hemoglobin 10.0 (*)    HCT 29.5 (*)    Platelets 122 (*)    All other components within normal limits  BASIC METABOLIC PANEL - Abnormal; Notable for the following components:   CO2 21 (*)    Glucose, Bld 124 (*)    Calcium 8.8 (*)    All other components within normal limits  GLUCOSE, CAPILLARY - Abnormal; Notable for the following components:   Glucose-Capillary 112 (*)    All other components within normal limits  GLUCOSE, CAPILLARY - Abnormal; Notable for the following components:   Glucose-Capillary 129 (*)    All other components within normal limits  GLUCOSE, CAPILLARY - Abnormal; Notable  for the following components:   Glucose-Capillary 118 (*)    All other components within normal limits  GLUCOSE, CAPILLARY - Abnormal; Notable for the following components:   Glucose-Capillary 142 (*)    All other components within normal limits  GLUCOSE, CAPILLARY - Abnormal; Notable for the following components:   Glucose-Capillary 147 (*)    All other components within normal limits  GLUCOSE, CAPILLARY - Abnormal; Notable for the following components:   Glucose-Capillary 161 (*)    All other components within normal limits  GLUCOSE, CAPILLARY - Abnormal; Notable for the following components:   Glucose-Capillary 149 (*)    All other components within normal limits  GLUCOSE, CAPILLARY - Abnormal; Notable for the following components:   Glucose-Capillary 102 (*)    All other components within normal limits  GLUCOSE, CAPILLARY - Abnormal; Notable for the following components:   Glucose-Capillary 221 (*)    All other components within normal limits  CBG MONITORING, ED - Abnormal; Notable for the following components:   Glucose-Capillary 147 (*)    All other components within normal limits  CBG MONITORING, ED - Abnormal; Notable for the following components:   Glucose-Capillary 139 (*)    All other components within normal limits  CULTURE, BLOOD (ROUTINE X 2)  CULTURE, BLOOD (ROUTINE X 2)  CULTURE, BLOOD (ROUTINE X 2)  LIPASE, BLOOD  INFLUENZA PANEL BY PCR (TYPE A & B)  TROPONIN I  LACTIC ACID, PLASMA  LACTIC ACID, PLASMA  TROPONIN I  HEPATITIS PANEL, ACUTE  HIV ANTIBODY (ROUTINE TESTING W REFLEX)  VITAMIN B12  FOLATE  MAGNESIUM  TSH  MAGNESIUM  CREATININE, SERUM  I-STAT TROPONIN, ED    EKG None  Radiology No results found.  Procedures .Critical Care Performed by: Emi Holes, PA-C Authorized by: Emi Holes, PA-C   Critical care provider statement:    Critical care time (minutes):  45   Critical care was necessary to treat or prevent imminent or  life-threatening deterioration of the following conditions:  Sepsis   Critical care was time spent personally by me on the following activities:  Discussions with consultants, evaluation of patient's response to treatment, examination of patient, ordering and performing treatments and interventions, ordering and review of laboratory studies, ordering and review of radiographic studies, pulse oximetry, re-evaluation of patient's condition, obtaining history from patient or surrogate and review of old charts   I assumed direction of critical care for this patient from another  provider in my specialty: yes     (including critical care time)  Medications Ordered in ED Medications  acetaminophen (TYLENOL) tablet 650 mg (650 mg Oral Given 10/07/18 0908)  ondansetron (ZOFRAN) injection 4 mg (has no administration in time range)  nicotine (NICODERM CQ - dosed in mg/24 hours) patch 21 mg (21 mg Transdermal Patch Removed 10/09/18 1123)  insulin aspart (novoLOG) injection 0-20 Units (7 Units Subcutaneous Given 10/12/18 0856)  QUEtiapine (SEROQUEL) tablet 400 mg (400 mg Oral Given 10/11/18 2155)  traZODone (DESYREL) tablet 100 mg (100 mg Oral Given 10/11/18 2154)  ceFAZolin (ANCEF) IVPB 2g/100 mL premix (2 g Intravenous New Bag/Given 10/12/18 0635)  tamsulosin (FLOMAX) capsule 0.4 mg (0.4 mg Oral Given 10/11/18 1719)  senna-docusate (Senokot-S) tablet 1 tablet (1 tablet Oral Given 10/12/18 0900)  vitamin B-12 (CYANOCOBALAMIN) tablet 1,000 mcg (1,000 mcg Oral Given 10/12/18 0900)  polyethylene glycol (MIRALAX / GLYCOLAX) packet 17 g (17 g Oral Given 10/11/18 2154)  0.9 %  sodium chloride infusion ( Intravenous Stopped 10/08/18 1920)  nicotine (NICODERM CQ - dosed in mg/24 hours) patch 14 mg (14 mg Transdermal Patch Removed 10/12/18 0901)  enoxaparin (LOVENOX) injection 50 mg (50 mg Subcutaneous Given 10/11/18 1334)  0.9 %  sodium chloride infusion ( Intravenous Restarted 10/12/18 0277)  0.9 %  sodium chloride infusion (  Intravenous Stopped 10/05/18 1600)  ceFEPIme (MAXIPIME) 2 g in sodium chloride 0.9 % 100 mL IVPB (0 g Intravenous Stopped 10/05/18 0020)  sodium chloride 0.9 % bolus 1,000 mL (0 mLs Intravenous Stopped 10/05/18 0029)  vancomycin (VANCOCIN) 2,000 mg in sodium chloride 0.9 % 500 mL IVPB (0 mg Intravenous Stopped 10/05/18 0144)  lactated ringers bolus 1,000 mL (0 mLs Intravenous Stopped 10/05/18 0445)  ondansetron (ZOFRAN) injection 4 mg (4 mg Intravenous Given 10/05/18 0115)  alum & mag hydroxide-simeth (MAALOX/MYLANTA) 200-200-20 MG/5ML suspension 30 mL (30 mLs Oral Given 10/05/18 0115)  potassium chloride SA (K-DUR,KLOR-CON) CR tablet 40 mEq (40 mEq Oral Given 10/05/18 1645)  sodium chloride 0.9 % bolus 1,000 mL (1,000 mLs Intravenous New Bag/Given 10/06/18 0020)  sodium chloride 0.9 % bolus 1,000 mL (1,000 mLs Intravenous New Bag/Given 10/06/18 0541)  potassium chloride SA (K-DUR,KLOR-CON) CR tablet 40 mEq (40 mEq Oral Given 10/06/18 0822)  magnesium sulfate IVPB 2 g 50 mL (2 g Intravenous New Bag/Given 10/06/18 0825)  sodium chloride 0.9 % bolus 1,000 mL (1,000 mLs Intravenous New Bag/Given 10/06/18 1236)  magnesium sulfate IVPB 4 g 100 mL (4 g Intravenous New Bag/Given 10/07/18 1823)  magnesium oxide (MAG-OX) tablet 400 mg (400 mg Oral Given 10/09/18 0804)  potassium chloride SA (K-DUR,KLOR-CON) CR tablet 40 mEq (40 mEq Oral Given 10/08/18 1123)  magnesium sulfate IVPB 4 g 100 mL (4 g Intravenous New Bag/Given 10/09/18 1002)     Initial Impression / Assessment and Plan / ED Course  I have reviewed the triage vital signs and the nursing notes.  Pertinent labs & imaging results that were available during my care of the patient were reviewed by me and considered in my medical decision making (see chart for details).        Patient presenting with sepsis.  Suspect urine or surgical site as cause.  Patient be started on broad-spectrum antibiotics for unknown source.  Blood cultures pending.  Initial lactic 2.6.  Patient  given 1 L of fluids.  Influenza a negative.  Leukocytosis at 19.7.  Chest x-ray is clear and shows mild cardiomegaly.  X-ray of the left foot shows amputation, but  no findings of osteomyelitis.  I discussed patient case with Dr. Robb Matar with [facility] who accepts patient for admission.  Patient also evaluated by my attending, Dr. Rhunette Croft, who guided the patient's management and agrees with plan.  Final Clinical Impressions(s) / ED Diagnoses   Final diagnoses:  Sepsis without acute organ dysfunction, due to unspecified organism [facility])    ED Discharge Orders         Ordered    Change dressing    Comments:  Dry dressing change left foot daily.   10/09/18 0746    Non weight bearing     10/09/18 0746    Increase activity slowly     10/09/18 0850    Diet - low sodium heart healthy     10/09/18 0850           Emi Holes, PA-C 10/05/18 Jeannie Fend, MD 10/05/18 1621    Emi Holes, PA-C 10/12/18 1055    Derwood Kaplan, MD 10/12/18 [phone]

## 2018-10-05 ENCOUNTER — Encounter (HOSPITAL_COMMUNITY): Payer: Self-pay | Admitting: Internal Medicine

## 2018-10-05 ENCOUNTER — Inpatient Hospital Stay (HOSPITAL_COMMUNITY): Payer: Medicare Other

## 2018-10-05 DIAGNOSIS — F319 Bipolar disorder, unspecified: Secondary | ICD-10-CM | POA: Diagnosis not present

## 2018-10-05 DIAGNOSIS — I340 Nonrheumatic mitral (valve) insufficiency: Secondary | ICD-10-CM

## 2018-10-05 DIAGNOSIS — R451 Restlessness and agitation: Secondary | ICD-10-CM | POA: Diagnosis not present

## 2018-10-05 DIAGNOSIS — I10 Essential (primary) hypertension: Secondary | ICD-10-CM | POA: Diagnosis present

## 2018-10-05 DIAGNOSIS — R7881 Bacteremia: Secondary | ICD-10-CM | POA: Diagnosis not present

## 2018-10-05 DIAGNOSIS — Z8739 Personal history of other diseases of the musculoskeletal system and connective tissue: Secondary | ICD-10-CM

## 2018-10-05 DIAGNOSIS — Z89422 Acquired absence of other left toe(s): Secondary | ICD-10-CM

## 2018-10-05 DIAGNOSIS — Z833 Family history of diabetes mellitus: Secondary | ICD-10-CM | POA: Diagnosis not present

## 2018-10-05 DIAGNOSIS — R609 Edema, unspecified: Secondary | ICD-10-CM | POA: Diagnosis not present

## 2018-10-05 DIAGNOSIS — I361 Nonrheumatic tricuspid (valve) insufficiency: Secondary | ICD-10-CM | POA: Diagnosis not present

## 2018-10-05 DIAGNOSIS — N400 Enlarged prostate without lower urinary tract symptoms: Secondary | ICD-10-CM | POA: Diagnosis present

## 2018-10-05 DIAGNOSIS — E876 Hypokalemia: Secondary | ICD-10-CM | POA: Diagnosis present

## 2018-10-05 DIAGNOSIS — I110 Hypertensive heart disease with heart failure: Secondary | ICD-10-CM

## 2018-10-05 DIAGNOSIS — Z8249 Family history of ischemic heart disease and other diseases of the circulatory system: Secondary | ICD-10-CM | POA: Diagnosis not present

## 2018-10-05 DIAGNOSIS — Z89511 Acquired absence of right leg below knee: Secondary | ICD-10-CM | POA: Diagnosis not present

## 2018-10-05 DIAGNOSIS — E119 Type 2 diabetes mellitus without complications: Secondary | ICD-10-CM

## 2018-10-05 DIAGNOSIS — R079 Chest pain, unspecified: Secondary | ICD-10-CM

## 2018-10-05 DIAGNOSIS — F3130 Bipolar disorder, current episode depressed, mild or moderate severity, unspecified: Secondary | ICD-10-CM | POA: Diagnosis present

## 2018-10-05 DIAGNOSIS — E118 Type 2 diabetes mellitus with unspecified complications: Secondary | ICD-10-CM | POA: Diagnosis not present

## 2018-10-05 DIAGNOSIS — K5900 Constipation, unspecified: Secondary | ICD-10-CM | POA: Diagnosis not present

## 2018-10-05 DIAGNOSIS — Z7984 Long term (current) use of oral hypoglycemic drugs: Secondary | ICD-10-CM | POA: Diagnosis not present

## 2018-10-05 DIAGNOSIS — D649 Anemia, unspecified: Secondary | ICD-10-CM | POA: Diagnosis present

## 2018-10-05 DIAGNOSIS — B955 Unspecified streptococcus as the cause of diseases classified elsewhere: Secondary | ICD-10-CM

## 2018-10-05 DIAGNOSIS — R17 Unspecified jaundice: Secondary | ICD-10-CM | POA: Diagnosis present

## 2018-10-05 DIAGNOSIS — Z89432 Acquired absence of left foot: Secondary | ICD-10-CM | POA: Diagnosis not present

## 2018-10-05 DIAGNOSIS — T8744 Infection of amputation stump, left lower extremity: Secondary | ICD-10-CM | POA: Diagnosis present

## 2018-10-05 DIAGNOSIS — I480 Paroxysmal atrial fibrillation: Secondary | ICD-10-CM | POA: Diagnosis present

## 2018-10-05 DIAGNOSIS — F17210 Nicotine dependence, cigarettes, uncomplicated: Secondary | ICD-10-CM | POA: Diagnosis present

## 2018-10-05 DIAGNOSIS — F1410 Cocaine abuse, uncomplicated: Secondary | ICD-10-CM | POA: Diagnosis present

## 2018-10-05 DIAGNOSIS — D696 Thrombocytopenia, unspecified: Secondary | ICD-10-CM | POA: Diagnosis present

## 2018-10-05 DIAGNOSIS — A419 Sepsis, unspecified organism: Secondary | ICD-10-CM

## 2018-10-05 DIAGNOSIS — Z590 Homelessness: Secondary | ICD-10-CM | POA: Diagnosis not present

## 2018-10-05 DIAGNOSIS — B9561 Methicillin susceptible Staphylococcus aureus infection as the cause of diseases classified elsewhere: Secondary | ICD-10-CM

## 2018-10-05 DIAGNOSIS — I509 Heart failure, unspecified: Secondary | ICD-10-CM

## 2018-10-05 DIAGNOSIS — R45851 Suicidal ideations: Secondary | ICD-10-CM | POA: Diagnosis present

## 2018-10-05 DIAGNOSIS — A4101 Sepsis due to Methicillin susceptible Staphylococcus aureus: Secondary | ICD-10-CM | POA: Diagnosis present

## 2018-10-05 LAB — CBC WITH DIFFERENTIAL/PLATELET
ABS IMMATURE GRANULOCYTES: 0.12 10*3/uL — AB (ref 0.00–0.07)
BASOS PCT: 0 %
Basophils Absolute: 0 10*3/uL (ref 0.0–0.1)
Eosinophils Absolute: 0.2 10*3/uL (ref 0.0–0.5)
Eosinophils Relative: 1 %
HCT: 30.9 % — ABNORMAL LOW (ref 39.0–52.0)
Hemoglobin: 10.4 g/dL — ABNORMAL LOW (ref 13.0–17.0)
Immature Granulocytes: 1 %
Lymphocytes Relative: 14 %
Lymphs Abs: 2.4 10*3/uL (ref 0.7–4.0)
MCH: 31.6 pg (ref 26.0–34.0)
MCHC: 33.7 g/dL (ref 30.0–36.0)
MCV: 93.9 fL (ref 80.0–100.0)
Monocytes Absolute: 1.9 10*3/uL — ABNORMAL HIGH (ref 0.1–1.0)
Monocytes Relative: 11 %
NEUTROS ABS: 12.5 10*3/uL — AB (ref 1.7–7.7)
Neutrophils Relative %: 73 %
PLATELETS: 138 10*3/uL — AB (ref 150–400)
RBC: 3.29 MIL/uL — ABNORMAL LOW (ref 4.22–5.81)
RDW: 13.7 % (ref 11.5–15.5)
WBC: 17.1 10*3/uL — ABNORMAL HIGH (ref 4.0–10.5)
nRBC: 0 % (ref 0.0–0.2)

## 2018-10-05 LAB — URINALYSIS, ROUTINE W REFLEX MICROSCOPIC
Glucose, UA: NEGATIVE mg/dL
Hgb urine dipstick: NEGATIVE
Ketones, ur: 5 mg/dL — AB
Leukocytes,Ua: NEGATIVE
Nitrite: NEGATIVE
Protein, ur: 30 mg/dL — AB
Specific Gravity, Urine: 1.034 — ABNORMAL HIGH (ref 1.005–1.030)
pH: 5 (ref 5.0–8.0)

## 2018-10-05 LAB — COMPREHENSIVE METABOLIC PANEL
ALT: 13 U/L (ref 0–44)
AST: 20 U/L (ref 15–41)
Albumin: 3.1 g/dL — ABNORMAL LOW (ref 3.5–5.0)
Alkaline Phosphatase: 90 U/L (ref 38–126)
Anion gap: 11 (ref 5–15)
BUN: 14 mg/dL (ref 6–20)
CO2: 19 mmol/L — ABNORMAL LOW (ref 22–32)
Calcium: 8.2 mg/dL — ABNORMAL LOW (ref 8.9–10.3)
Chloride: 106 mmol/L (ref 98–111)
Creatinine, Ser: 0.95 mg/dL (ref 0.61–1.24)
GFR calc Af Amer: >60 mL/min (ref >60)
GFR calc non Af Amer: >60 mL/min (ref >60)
Glucose, Bld: 169 mg/dL — ABNORMAL HIGH (ref 70–99)
Potassium: 2.9 mmol/L — ABNORMAL LOW (ref 3.5–5.1)
SODIUM: 136 mmol/L (ref 135–145)
TOTAL PROTEIN: 6.2 g/dL — AB (ref 6.5–8.1)
Total Bilirubin: 3 mg/dL — ABNORMAL HIGH (ref 0.3–1.2)

## 2018-10-05 LAB — BLOOD CULTURE ID PANEL (REFLEXED)
Acinetobacter baumannii: NOT DETECTED
Candida albicans: NOT DETECTED
Candida glabrata: NOT DETECTED
Candida krusei: NOT DETECTED
Candida parapsilosis: NOT DETECTED
Candida tropicalis: NOT DETECTED
Enterobacter cloacae complex: NOT DETECTED
Enterobacteriaceae species: NOT DETECTED
Enterococcus species: NOT DETECTED
Escherichia coli: NOT DETECTED
Haemophilus influenzae: NOT DETECTED
Klebsiella oxytoca: NOT DETECTED
Klebsiella pneumoniae: NOT DETECTED
Listeria monocytogenes: NOT DETECTED
Methicillin resistance: NOT DETECTED
Neisseria meningitidis: NOT DETECTED
Proteus species: NOT DETECTED
Pseudomonas aeruginosa: NOT DETECTED
Serratia marcescens: NOT DETECTED
Staphylococcus aureus (BCID): DETECTED — AB
Staphylococcus species: DETECTED — AB
Streptococcus agalactiae: NOT DETECTED
Streptococcus pneumoniae: NOT DETECTED
Streptococcus pyogenes: NOT DETECTED
Streptococcus species: DETECTED — AB

## 2018-10-05 LAB — ECHOCARDIOGRAM COMPLETE
HEIGHTINCHES: 70 in
Weight: 4160 oz

## 2018-10-05 LAB — CBG MONITORING, ED
GLUCOSE-CAPILLARY: 139 mg/dL — AB (ref 70–99)
Glucose-Capillary: 147 mg/dL — ABNORMAL HIGH (ref 70–99)

## 2018-10-05 LAB — TROPONIN I
Troponin I: <0.03 ng/mL (ref <0.03)
Troponin I: <0.03 ng/mL (ref <0.03)

## 2018-10-05 LAB — LACTIC ACID, PLASMA
Lactic Acid, Venous: 1.8 mmol/L (ref 0.5–1.9)
Lactic Acid, Venous: 1.4 mmol/L (ref 0.5–1.9)

## 2018-10-05 LAB — GLUCOSE, CAPILLARY: Glucose-Capillary: 143 mg/dL — ABNORMAL HIGH (ref 70–99)

## 2018-10-05 LAB — BILIRUBIN, DIRECT: Bilirubin, Direct: 0.9 mg/dL — ABNORMAL HIGH (ref 0.0–0.2)

## 2018-10-05 LAB — RAPID URINE DRUG SCREEN, HOSP PERFORMED
Amphetamines: NOT DETECTED
Barbiturates: NOT DETECTED
Benzodiazepines: NOT DETECTED
Cocaine: POSITIVE — AB
Opiates: NOT DETECTED
Tetrahydrocannabinol: NOT DETECTED

## 2018-10-05 LAB — INFLUENZA PANEL BY PCR (TYPE A & B)
Influenza A By PCR: NEGATIVE
Influenza B By PCR: NEGATIVE

## 2018-10-05 MED ORDER — ENOXAPARIN SODIUM 60 MG/0.6ML ~~LOC~~ SOLN
60.0000 mg | SUBCUTANEOUS | Status: DC
  Administered 2018-10-05 – 2018-10-07 (×3): 60 mg via SUBCUTANEOUS
  Filled 2018-10-05 (×4): qty 0.6

## 2018-10-05 MED ORDER — LACTATED RINGERS IV BOLUS (SEPSIS)
1000.0000 mL | Freq: Once | INTRAVENOUS | Status: AC
  Administered 2018-10-05: 1000 mL via INTRAVENOUS

## 2018-10-05 MED ORDER — POTASSIUM CHLORIDE CRYS ER 20 MEQ PO TBCR
40.0000 meq | EXTENDED_RELEASE_TABLET | Freq: Once | ORAL | Status: AC
  Administered 2018-10-05: 40 meq via ORAL
  Filled 2018-10-05: qty 2

## 2018-10-05 MED ORDER — SODIUM CHLORIDE 0.9 % IV SOLN
INTRAVENOUS | Status: DC
  Administered 2018-10-06 – 2018-10-07 (×5): via INTRAVENOUS

## 2018-10-05 MED ORDER — QUETIAPINE FUMARATE 400 MG PO TABS
400.0000 mg | ORAL_TABLET | Freq: Every day | ORAL | Status: DC
  Administered 2018-10-05 – 2018-10-17 (×13): 400 mg via ORAL
  Filled 2018-10-05 (×13): qty 1

## 2018-10-05 MED ORDER — ENOXAPARIN SODIUM 40 MG/0.4ML ~~LOC~~ SOLN: 40.0000 mg | SUBCUTANEOUS | Status: DC

## 2018-10-05 MED ORDER — CEFAZOLIN SODIUM-DEXTROSE 2-4 GM/100ML-% IV SOLN
2.0000 g | Freq: Three times a day (TID) | INTRAVENOUS | Status: DC
  Administered 2018-10-05 – 2018-10-18 (×39): 2 g via INTRAVENOUS
  Filled 2018-10-05 (×41): qty 100

## 2018-10-05 MED ORDER — TRAZODONE HCL 50 MG PO TABS
100.0000 mg | ORAL_TABLET | Freq: Every day | ORAL | Status: DC
  Administered 2018-10-05 – 2018-10-17 (×13): 100 mg via ORAL
  Filled 2018-10-05 (×13): qty 2

## 2018-10-05 MED ORDER — INSULIN ASPART 100 UNIT/ML ~~LOC~~ SOLN
0.0000 [IU] | Freq: Three times a day (TID) | SUBCUTANEOUS | Status: DC
  Administered 2018-10-05 – 2018-10-06 (×4): 3 [IU] via SUBCUTANEOUS
  Administered 2018-10-07: 4 [IU] via SUBCUTANEOUS
  Administered 2018-10-07 (×2): 3 [IU] via SUBCUTANEOUS
  Administered 2018-10-08: 4 [IU] via SUBCUTANEOUS
  Administered 2018-10-09 – 2018-10-11 (×5): 3 [IU] via SUBCUTANEOUS
  Administered 2018-10-11: 4 [IU] via SUBCUTANEOUS
  Administered 2018-10-11 – 2018-10-12 (×2): 3 [IU] via SUBCUTANEOUS
  Administered 2018-10-12: 4 [IU] via SUBCUTANEOUS
  Administered 2018-10-12 – 2018-10-13 (×2): 7 [IU] via SUBCUTANEOUS
  Administered 2018-10-13: 4 [IU] via SUBCUTANEOUS
  Administered 2018-10-13: 3 [IU] via SUBCUTANEOUS
  Administered 2018-10-14: 4 [IU] via SUBCUTANEOUS
  Administered 2018-10-18: 3 [IU] via SUBCUTANEOUS

## 2018-10-05 MED ORDER — ONDANSETRON HCL 4 MG/2ML IJ SOLN
4.0000 mg | Freq: Once | INTRAMUSCULAR | Status: AC
  Administered 2018-10-05: 4 mg via INTRAVENOUS
  Filled 2018-10-05: qty 2

## 2018-10-05 MED ORDER — ACETAMINOPHEN 325 MG PO TABS
650.0000 mg | ORAL_TABLET | ORAL | Status: DC | PRN
  Administered 2018-10-05 – 2018-10-17 (×3): 650 mg via ORAL
  Filled 2018-10-05 (×3): qty 2

## 2018-10-05 MED ORDER — ALUM & MAG HYDROXIDE-SIMETH 200-200-20 MG/5ML PO SUSP
30.0000 mL | Freq: Once | ORAL | Status: AC
  Administered 2018-10-05: 30 mL via ORAL
  Filled 2018-10-05: qty 30

## 2018-10-05 MED ORDER — NICOTINE 21 MG/24HR TD PT24
21.0000 mg | MEDICATED_PATCH | Freq: Every day | TRANSDERMAL | Status: DC | PRN
  Administered 2018-10-08: 21 mg via TRANSDERMAL
  Filled 2018-10-05: qty 1

## 2018-10-05 MED ORDER — ONDANSETRON HCL 4 MG/2ML IJ SOLN: 4.0000 mg | Freq: Four times a day (QID) | INTRAMUSCULAR | Status: DC | PRN

## 2018-10-05 MED ORDER — METFORMIN HCL 500 MG PO TABS
1000.0000 mg | ORAL_TABLET | Freq: Two times a day (BID) | ORAL | Status: DC
  Administered 2018-10-05 (×2): 1000 mg via ORAL
  Filled 2018-10-05 (×2): qty 2

## 2018-10-05 NOTE — ED Notes (Signed)
Breakfast ordered heart healthy carb mod

## 2018-10-05 NOTE — ED Notes (Signed)
ED TO INPATIENT HANDOFF REPORT  ED Nurse Name and Phone #: Florentina Addison [phone]  S Name/Age/Gender [name] 58 y.o. male Room/Bed: 017C/017C  Code Status   Code Status: Full Code  Home/SNF/Other Home Patient oriented to: self, place, time and situation Is this baseline? Yes   Triage Complete: Triage complete  Chief Complaint sepsis  Triage Note Per ems pt was at home with chest pain which has been going on for a few weeks and gotten worse tonight. Also had surgery on his left foot with his toes amputated and walked to the store last night and has been bleeding every since.n/v.  140/80 99 ra, 258 cbg, alert oriented x 4   Allergies No Known Allergies  Level of Care/Admitting Diagnosis ED Disposition    ED Disposition Condition Comment   Admit  Hospital Area: [facility] [[id number]]  Level of Care: Progressive [102]  Diagnosis: Sepsis due to undetermined organism [facility]) [[id number]]  Admitting Physician: Bobette Mo [[id number]]  Attending Physician: Bobette Mo [[id number]]  Estimated length of stay: past midnight tomorrow  Certification:: I certify this patient will need inpatient services for at least 2 midnights  PT Class (Do Not Modify): Inpatient [101]  PT Acc Code (Do Not Modify): Private [1]       B Medical/Surgery History Past Medical History:  Diagnosis Date  . Anxiety   . CHF (congestive heart failure) (HCC)   . Depressed bipolar disorder (HCC)   . Diabetes mellitus without complication (HCC)   . Hypertension    Past Surgical History:  Procedure Laterality Date  . AMPUTATION Left 09/28/2018   Procedure: LEFT TRANSMETATARSAL AMPUTATION;  Surgeon: Nadara Mustard, MD;  Location: [facility] OR;  Service: Orthopedics;  Laterality: Left;  . BELOW KNEE LEG AMPUTATION Right      A IV Location/Drains/Wounds Patient Lines/Drains/Airways Status   Active Line/Drains/Airways    Name:   Placement date:   Placement time:   Site:   Days:   Peripheral IV 10/04/18 Left Wrist   10/04/18    2234    Wrist   1   Negative Pressure Wound Therapy Foot Left   09/28/18    0858    -   7   Incision (Closed) 09/28/18 Leg Left   09/28/18    0858     7          Intake/Output Last 24 hours  Intake/Output Summary (Last 24 hours) at 10/05/2018 1318 Last data filed at 10/05/2018 1250 Gross per 24 hour  Intake 4482.75 ml  Output -  Net 4482.75 ml    Labs/Imaging Results for orders placed or performed during the hospital encounter of 10/04/18 (from the past 48 hour(s))  Lactic acid, plasma     Status: Abnormal   Collection Time: 10/04/18 10:31 PM  Result Value Ref Range   Lactic Acid, Venous 2.6 (HH) 0.5 - 1.9 mmol/L    Comment: CRITICAL RESULT CALLED TO, READ BACK BY AND VERIFIED WITH: SANDERS,A RN 10/04/2018 2308 JORDANS Performed at [facility], [address], [place], [place] [address]   Comprehensive metabolic panel     Status: Abnormal   Collection Time: 10/04/18 10:31 PM  Result Value Ref Range   Sodium 135 135 - 145 mmol/L   Potassium 3.4 (L) 3.5 - 5.1 mmol/L   Chloride 108 98 - 111 mmol/L   CO2 18 (L) 22 - 32 mmol/L   Glucose, Bld 220 (H) 70 - 99 mg/dL  BUN 14 6 - 20 mg/dL   Creatinine, Ser 8.11 0.61 - 1.24 mg/dL   Calcium 8.5 (L) 8.9 - 10.3 mg/dL   Total Protein 7.2 6.5 - 8.1 g/dL   Albumin 3.7 3.5 - 5.0 g/dL   AST 28 15 - 41 U/L   ALT 14 0 - 44 U/L   Alkaline Phosphatase 112 38 - 126 U/L   Total Bilirubin 5.2 (H) 0.3 - 1.2 mg/dL   GFR calc non Af Amer >60 >60 mL/min   GFR calc Af Amer >60 >60 mL/min   Anion gap 9 5 - 15    Comment: Performed at [facility] Lab, [address], [place], [place] [address]  CBC WITH DIFFERENTIAL     Status: Abnormal   Collection Time: 10/04/18 10:31 PM  Result Value Ref Range   WBC 19.7 (H) 4.0 - 10.5 K/uL   RBC 3.88 (L) 4.22 - 5.81 MIL/uL   Hemoglobin 12.4 (L) 13.0 - 17.0 g/dL   HCT 29.5 (L) 62.1 - 30.8 %   MCV 93.8 80.0 - 100.0 fL   MCH 32.0 26.0 - 34.0 pg   MCHC  34.1 30.0 - 36.0 g/dL   RDW 65.7 84.6 - 96.2 %   Platelets 182 150 - 400 K/uL   nRBC 0.0 0.0 - 0.2 %   Neutrophils Relative % 84 %   Neutro Abs 16.7 (H) 1.7 - 7.7 K/uL   Lymphocytes Relative 7 %   Lymphs Abs 1.3 0.7 - 4.0 K/uL   Monocytes Relative 7 %   Monocytes Absolute 1.5 (H) 0.1 - 1.0 K/uL   Eosinophils Relative 1 %   Eosinophils Absolute 0.1 0.0 - 0.5 K/uL   Basophils Relative 0 %   Basophils Absolute 0.0 0.0 - 0.1 K/uL   Immature Granulocytes 1 %   Abs Immature Granulocytes 0.14 (H) 0.00 - 0.07 K/uL    Comment: Performed at [facility] Lab, [address], [place], [place] [address]  Lipase, blood     Status: None   Collection Time: 10/04/18 10:31 PM  Result Value Ref Range   Lipase 21 11 - 51 U/L    Comment: Performed at [facility], [address], [place], [place] [address]  Blood Culture (routine x 2)     Status: None (Preliminary result)   Collection Time: 10/04/18 10:34 PM  Result Value Ref Range   Specimen Description BLOOD LEFT HAND    Special Requests      BOTTLES DRAWN AEROBIC AND ANAEROBIC Blood Culture adequate volume   Culture  Setup Time      GRAM POSITIVE COCCI AEROBIC BOTTLE ONLY Organism ID to follow Performed at [facility], [address], [place], [place] [address]    Culture GRAM POSITIVE COCCI    Report Status PENDING   Blood Culture (routine x 2)     Status: None (Preliminary result)   Collection Time: 10/04/18 10:35 PM  Result Value Ref Range   Specimen Description BLOOD LEFT ANTECUBITAL    Special Requests      BOTTLES DRAWN AEROBIC ONLY Blood Culture results may not be optimal due to an inadequate volume of blood received in culture bottles   Culture      NO GROWTH < 24 HOURS Performed at [facility], [address], [place], [place] [address]    Report Status PENDING   Influenza panel by PCR (type A & B)     Status: None  Collection Time: 10/04/18 11:15 PM  Result Value Ref Range   Influenza A By PCR NEGATIVE  NEGATIVE   Influenza B By PCR NEGATIVE NEGATIVE    Comment: (NOTE) The Xpert Xpress Flu assay is intended as an aid in the diagnosis of  influenza and should not be used as a sole basis for treatment.  This  assay is FDA approved for nasopharyngeal swab specimens only. Nasal  washings and aspirates are unacceptable for Xpert Xpress Flu testing. Performed at [facility] Lab, [address], [place], [place] [address]   I-Stat Troponin, ED (not at [facility])     Status: None   Collection Time: 10/04/18 11:45 PM  Result Value Ref Range   Troponin i, poc 0.00 0.00 - 0.08 ng/mL   Comment 3            Comment: Due to the release kinetics of cTnI, a negative result within the first hours of the onset of symptoms does not rule out myocardial infarction with certainty. If myocardial infarction is still suspected, repeat the test at appropriate intervals.   Lactic acid, plasma     Status: None   Collection Time: 10/05/18 12:48 AM  Result Value Ref Range   Lactic Acid, Venous 1.8 0.5 - 1.9 mmol/L    Comment: Performed at [facility], [address] [place], [place] [address]  CBC with Differential     Status: Abnormal   Collection Time: 10/05/18  6:18 AM  Result Value Ref Range   WBC 17.1 (H) 4.0 - 10.5 K/uL   RBC 3.29 (L) 4.22 - 5.81 MIL/uL   Hemoglobin 10.4 (L) 13.0 - 17.0 g/dL   HCT 82.9 (L) 56.2 - 13.0 %   MCV 93.9 80.0 - 100.0 fL   MCH 31.6 26.0 - 34.0 pg   MCHC 33.7 30.0 - 36.0 g/dL   RDW 86.5 78.4 - 69.6 %   Platelets 138 (L) 150 - 400 K/uL   nRBC 0.0 0.0 - 0.2 %   Neutrophils Relative % 73 %   Neutro Abs 12.5 (H) 1.7 - 7.7 K/uL   Lymphocytes Relative 14 %   Lymphs Abs 2.4 0.7 - 4.0 K/uL   Monocytes Relative 11 %   Monocytes Absolute 1.9 (H) 0.1 - 1.0 K/uL   Eosinophils Relative 1 %   Eosinophils Absolute 0.2 0.0 - 0.5 K/uL   Basophils Relative 0 %   Basophils Absolute 0.0 0.0 - 0.1 K/uL   Immature Granulocytes 1 %   Abs Immature Granulocytes 0.12 (H) 0.00 - 0.07  K/uL    Comment: Performed at [facility], [address] [place], [place] [address]  Comprehensive metabolic panel     Status: Abnormal   Collection Time: 10/05/18  6:18 AM  Result Value Ref Range   Sodium 136 135 - 145 mmol/L   Potassium 2.9 (L) 3.5 - 5.1 mmol/L   Chloride 106 98 - 111 mmol/L   CO2 19 (L) 22 - 32 mmol/L   Glucose, Bld 169 (H) 70 - 99 mg/dL   BUN 14 6 - 20 mg/dL   Creatinine, Ser 4.13 0.61 - 1.24 mg/dL   Calcium 8.2 (L) 8.9 - 10.3 mg/dL   Total Protein 6.2 (L) 6.5 - 8.1 g/dL   Albumin 3.1 (L) 3.5 - 5.0 g/dL   AST 20 15 - 41 U/L   ALT 13 0 - 44 U/L   Alkaline Phosphatase 90 38 - 126 U/L   Total Bilirubin 3.0 (  H) 0.3 - 1.2 mg/dL   GFR calc non Af Amer >60 >60 mL/min   GFR calc Af Amer >60 >60 mL/min   Anion gap 11 5 - 15    Comment: Performed at [facility] Lab, [address] [place], [place] [address]  Lactic acid, plasma     Status: None   Collection Time: 10/05/18  6:18 AM  Result Value Ref Range   Lactic Acid, Venous 1.4 0.5 - 1.9 mmol/L    Comment: Performed at [facility], [address] [place], [place] [address]  Bilirubin, direct     Status: Abnormal   Collection Time: 10/05/18  6:18 AM  Result Value Ref Range   Bilirubin, Direct 0.9 (H) 0.0 - 0.2 mg/dL    Comment: Performed at [facility] Lab, [address] [place], [place] [address]  Troponin I -     Status: None   Collection Time: 10/05/18  6:18 AM  Result Value Ref Range   Troponin I <0.03 <0.03 ng/mL    Comment: Performed at [facility], [address], [place], [place] [address]  CBG monitoring, ED     Status: Abnormal   Collection Time: 10/05/18  7:17 AM  Result Value Ref Range   Glucose-Capillary 147 (H) 70 - 99 mg/dL  Urinalysis, Routine w reflex microscopic     Status: Abnormal   Collection Time: 10/05/18 11:53 AM  Result Value Ref Range   Color, Urine AMBER (A) YELLOW    Comment: BIOCHEMICALS MAY BE AFFECTED BY COLOR   APPearance CLEAR CLEAR    Specific Gravity, Urine 1.034 (H) 1.005 - 1.030   pH 5.0 5.0 - 8.0   Glucose, UA NEGATIVE NEGATIVE mg/dL   Hgb urine dipstick NEGATIVE NEGATIVE   Bilirubin Urine SMALL (A) NEGATIVE   Ketones, ur 5 (A) NEGATIVE mg/dL   Protein, ur 30 (A) NEGATIVE mg/dL   Nitrite NEGATIVE NEGATIVE   Leukocytes,Ua NEGATIVE NEGATIVE   RBC / HPF 0-5 0 - 5 RBC/hpf   WBC, UA 0-5 0 - 5 WBC/hpf   Bacteria, UA RARE (A) NONE SEEN   Squamous Epithelial / LPF 0-5 0 - 5   Mucus PRESENT     Comment: Performed at [facility], [address], [place], [place] [address]  CBG monitoring, ED     Status: Abnormal   Collection Time: 10/05/18 11:54 AM  Result Value Ref Range   Glucose-Capillary 139 (H) 70 - 99 mg/dL   Dg Chest 2 View  Result Date: 10/04/2018 CLINICAL DATA:  Cough for 4 days EXAM: CHEST - 2 VIEW COMPARISON:  05/08/2018 FINDINGS: Mild cardiomegaly. No overt pulmonary edema. Both lungs are clear. The visualized skeletal structures are unremarkable. IMPRESSION: No active cardiopulmonary disease.  Mild cardiomegaly Electronically Signed   By: Jasmine Pang M.D.   On: 10/04/2018 22:52   Dg Foot Complete Left  Result Date: 10/05/2018 CLINICAL DATA:  Recent foot surgery possible wound infection. EXAM: LEFT FOOT - COMPLETE 3+ VIEW COMPARISON:  Preoperative radiograph 09/24/2018 FINDINGS: Post transmetatarsal amputation of foot. The resection margins are sharp. No periosteal reaction or bony destructive change. Three linear foreign bodies projecting over the foot are unchanged prior exam, likely in the soft tissues, less likely external to the patient given persistence on pre and postoperative imaging. No tracking soft tissue air. Overlying dressing in place. IMPRESSION: Post transmetatarsal amputation of the foot. No radiographic findings of osteomyelitis. Electronically Signed   By: Narda Rutherford  M.D.   On: 10/05/2018 00:04    Pending Labs Unresulted Labs (From admission, onward)    Start     Ordered    10/12/18 0500  Creatinine, serum  (enoxaparin (LOVENOX)    CrCl >/= 30 ml/min)  Weekly,   R    Comments:  while on enoxaparin therapy    10/05/18 0054   10/05/18 0600  CBC with Differential  Daily,   R     10/05/18 0049   10/05/18 0545  Troponin I - Now Then Q6H  Now then every 6 hours,   R     10/05/18 0046   10/04/18 2234  Blood Culture ID Panel (Reflexed)  Once,   STAT     10/04/18 2234   10/04/18 2224  Urinalysis, Routine w reflex microscopic  ONCE - STAT,   STAT     10/04/18 2226          Vitals/Pain Today's Vitals   10/05/18 1100 10/05/18 1130 10/05/18 1230 10/05/18 1300  BP: 118/76 105/82 104/77 107/76  Pulse: 79 74 61 78  Resp: Temp:      TempSrc:      SpO2: 97% 96% 96% 96%  Weight:      Height:      PainSc:        Isolation Precautions No active isolations  Medications Medications  metroNIDAZOLE (FLAGYL) IVPB 500 mg ( Intravenous Stopped 10/05/18 0944)  vancomycin (VANCOCIN) 1,500 mg in sodium chloride 0.9 % 500 mL IVPB ( Intravenous Stopped 10/05/18 1220)  ceFEPIme (MAXIPIME) 1 g in sodium chloride 0.9 % 100 mL IVPB ( Intravenous Stopped 10/05/18 0804)  acetaminophen (TYLENOL) tablet 650 mg (650 mg Oral Given 10/05/18 0734)  ondansetron (ZOFRAN) injection 4 mg (has no administration in time range)  enoxaparin (LOVENOX) injection 60 mg (has no administration in time range)  nicotine (NICODERM CQ - dosed in mg/24 hours) patch 21 mg (has no administration in time range)  insulin aspart (novoLOG) injection 0-20 Units (3 Units Subcutaneous Given 10/05/18 0737)  metFORMIN (GLUCOPHAGE) tablet 1,000 mg (1,000 mg Oral Given 10/05/18 0735)  QUEtiapine (SEROQUEL) tablet 400 mg (has no administration in time range)  traZODone (DESYREL) tablet 100 mg (has no administration in time range)  0.9 %  sodium chloride infusion ( Intravenous Rate/Dose Change 10/05/18 0525)  0.9 %  sodium chloride infusion ( Intravenous New Bag/Given 10/04/18 2300)  ceFEPIme (MAXIPIME) 2 g in sodium  chloride 0.9 % 100 mL IVPB (0 g Intravenous Stopped 10/05/18 0020)  sodium chloride 0.9 % bolus 1,000 mL (0 mLs Intravenous Stopped 10/05/18 0029)  vancomycin (VANCOCIN) 2,000 mg in sodium chloride 0.9 % 500 mL IVPB (0 mg Intravenous Stopped 10/05/18 0144)  lactated ringers bolus 1,000 mL (0 mLs Intravenous Stopped 10/05/18 0445)  ondansetron (ZOFRAN) injection 4 mg (4 mg Intravenous Given 10/05/18 0115)  alum & mag hydroxide-simeth (MAALOX/MYLANTA) 200-200-20 MG/5ML suspension 30 mL (30 mLs Oral Given 10/05/18 0115)    Mobility walks with person assist Low fall risk   Focused Assessments Neuro Assessment Handoff:   Cardiac Rhythm: Normal sinus rhythm         R Recommendations: See Admitting Provider Note  Report given to:   Additional Notes:

## 2018-10-05 NOTE — ED Notes (Addendum)
ekg has been obtained but monitor will not export and maintenance has been working on it. Copy is in room with patient and ED dr has reviewed.

## 2018-10-05 NOTE — Progress Notes (Signed)
PHARMACY - PHYSICIAN COMMUNICATION CRITICAL VALUE ALERT - BLOOD CULTURE IDENTIFICATION (BCID)  Cory Palmer is an 58 y.o. male who presented to [facility] on 10/04/2018 with a chief complaint of chest pain for several weeks and fevers at home.   Assessment:  He recently had a partial foot amputation and some bleeding after walking. Lab called with BCID result of 1/3 bottles with MSSA and strep species not on BCID (could be viridan strep, group C, G, etc). We will optimize his abx to high dose cefazolin. ID will see on auto consult.   Name of physician (or Provider) Contacted: Dr Roda Shutters  Current antibiotics: Vanc/cefepime/flagyl  Changes to prescribed antibiotics recommended:  Change vanc/cefepime/flagyl to Cefazolin 2g IV q8 per protocol  Results for orders placed or performed during the hospital encounter of 10/04/18  Blood Culture ID Panel (Reflexed) (Collected: 10/04/2018 10:34 PM)  Result Value Ref Range   Enterococcus species NOT DETECTED NOT DETECTED   Listeria monocytogenes NOT DETECTED NOT DETECTED   Staphylococcus species DETECTED (A) NOT DETECTED   Staphylococcus aureus (BCID) DETECTED (A) NOT DETECTED   Methicillin resistance NOT DETECTED NOT DETECTED   Streptococcus species DETECTED (A) NOT DETECTED   Streptococcus agalactiae NOT DETECTED NOT DETECTED   Streptococcus pneumoniae NOT DETECTED NOT DETECTED   Streptococcus pyogenes NOT DETECTED NOT DETECTED   Acinetobacter baumannii NOT DETECTED NOT DETECTED   Enterobacteriaceae species NOT DETECTED NOT DETECTED   Enterobacter cloacae complex NOT DETECTED NOT DETECTED   Escherichia coli NOT DETECTED NOT DETECTED   Klebsiella oxytoca NOT DETECTED NOT DETECTED   Klebsiella pneumoniae NOT DETECTED NOT DETECTED   Proteus species NOT DETECTED NOT DETECTED   Serratia marcescens NOT DETECTED NOT DETECTED   Haemophilus influenzae NOT DETECTED NOT DETECTED   Neisseria meningitidis NOT DETECTED NOT DETECTED   Pseudomonas aeruginosa NOT  DETECTED NOT DETECTED   Candida albicans NOT DETECTED NOT DETECTED   Candida glabrata NOT DETECTED NOT DETECTED   Candida krusei NOT DETECTED NOT DETECTED   Candida parapsilosis NOT DETECTED NOT DETECTED   Candida tropicalis NOT DETECTED NOT DETECTED    [name], PharmD, BCIDP, AAHIVP, CPP Infectious Disease Pharmacist 10/05/2018 2:20 PM

## 2018-10-05 NOTE — Consult Note (Signed)
[facility]    Date of Admission:  10/04/2018     Total days of antibiotics                Reason for Consult: MSSA / Streptococcus Species Bacteremia   Referring Provider: CHAMP / [employer]  Primary Care Provider: Patient, [name] Per   Assessment/Plan:  [name] is a 58 y/o male with recent history of transmetatarsal amputation of the left foot due to osteomyelitis presenting with chest pain and fevers and found to have MSSA and Streptococcus species bacteremia with the most likely source being his left foot. X-rays with no evidence of osteomyelitis at present. Will check TTE to rule out endocarditis. This appear to be a soft tissue infection at present. Will continue current dose of Ancef and repeat blood cultures in the morning. Duration of therapy to be determined.  1. Continue current dose of Ancef. 2. Check repeat blood cultures for clearance of infection. 3. TTE to rule out endocarditis. 4. Wound care per Wound RN recommendations.  5. ID will continue to follow.         Antimicrobial Management Team Staphylococcus aureus bacteremia   Staphylococcus aureus bacteremia (SAB) is associated with a high rate of complications and mortality.  Specific aspects of clinical management are critical to optimizing the outcome of patients with SAB.  Therefore, the [facility] Antimicrobial Management Team [facility]) has initiated an intervention aimed at improving the management of SAB at [facility].  To do so, Infectious Diseases physicians are providing an evidence-based consult for the management of all patients with SAB.     Yes No Comments  Perform follow-up blood cultures (even if the patient is afebrile) to ensure clearance of bacteremia [x]  []    Remove vascular catheter and obtain follow-up blood cultures after the removal of the catheter []  [x]    Perform echocardiography to evaluate for endocarditis (transthoracic ECHO is 40-50% sensitive,  TEE is > 90% sensitive) [x]  []  Please keep in mind, that neither test can definitively EXCLUDE endocarditis, and that should clinical suspicion remain high for endocarditis the patient should then still be treated with an "endocarditis" duration of therapy = 6 weeks  Consult electrophysiologist to evaluate implanted cardiac device (pacemaker, ICD) []  [x]    Ensure source control [x]  []  Have all abscesses been drained effectively? Have deep seeded infections (septic joints or osteomyelitis) had appropriate surgical debridement?  Investigate for "metastatic" sites of infection [x]  []  Does the patient have ANY symptom or physical exam finding that would suggest a deeper infection (back or neck pain that may be suggestive of vertebral osteomyelitis or epidural abscess, muscle pain that could be a symptom of pyomyositis)?  Keep in mind that for deep seeded infections MRI imaging with contrast is preferred rather than other often insensitive tests such as plain x-rays, especially early in a patient's presentation.  Change antibiotic therapy to _______Ancef___________ [x]  []  Beta-lactam antibiotics are preferred for MSSA due to higher cure rates.   If on Vancomycin, goal trough should be 15 - 20 mcg/mL  Estimated duration of IV antibiotic therapy: TBD   [x]  []  Consult case management for probably prolonged outpatient IV antibiotic therapy     Principal Problem:   Sepsis due to undetermined organism [facility]) Active Problems:   Homelessness   DM (diabetes mellitus), type 2 with complications (HCC)   S/P transmetatarsal amputation of foot, left (HCC)   Depressed bipolar disorder (HCC)   Hypertension   Hyperbilirubinemia   .  enoxaparin (LOVENOX) injection  60 mg Subcutaneous Q24H  . insulin aspart  0-20 Units Subcutaneous TID WC  . metFORMIN  1,000 mg Oral BID WC  . QUEtiapine  400 mg Oral QHS  . traZODone  100 mg Oral QHS     HPI: Cory Palmer is a 58 y.o. male with a previous medical history of  hypertension, diabetes, bipolar disorder, and CHF with recent left transmetatarsal amputation on 09/28/18 presenting to the ED with a few week history of chest pain that suddenly worsened with associated symptoms of shortness of breath, nausea, and vomiting. He reports having been febrile at home.   Vital signs with elevated temperature of 100.3. Lab work with lactic acid of 2.6 and WBC count of 19.7. Chest x-ray with no cardiopulmonary disease and x-ray of the foot with no radiographic finding of osteomyelitis.   Wound care RN consulted with wound appearing macerated with weakly approximated edges with suture intact. Drainage found to be serosanguinous and without odor. [name] had been walking on is foot which is against the instructions of Dr. [name] who recommended minimizing activities and weightbearing on the foot.   Mildly elevated temperature of 100.3 since his arrival with blood cultures positive in 1/4 bottle for MSSA and Streptococcus species. Initial antimicrobial regimen of vancomycin, cefepime and metronidazole has been narrowed to Ancef by the pharmacy staff.   [name] reports that he has been having chest pain that waxes and wanes with decreased appetite over the past several days. He noted after seeing Dr. [name] that he was weightbearing on his foot when it started bleeding which has since improved. Having subjective fevers.    Review of Systems: Review of Systems  Constitutional: Positive for fever and malaise/fatigue. Negative for chills and diaphoresis.  Respiratory: Negative for cough, sputum production, shortness of breath and wheezing.   Cardiovascular: Positive for chest pain. Negative for claudication.  Gastrointestinal: Negative for constipation, diarrhea, nausea and vomiting.  Genitourinary: Negative for dysuria, frequency and hematuria.     Past Medical History:  Diagnosis Date  . Anxiety   . CHF (congestive heart failure) (HCC)   . Depressed bipolar disorder (HCC)    . Diabetes mellitus without complication (HCC)   . Hypertension     Social History   Tobacco Use  . Smoking status: Current Every Day Smoker    Types: Cigarettes  . Smokeless tobacco: Never Used  Substance Use Topics  . Alcohol use: Yes    Comment: occasional  . Drug use: Not Currently    Family History  Problem Relation Age of Onset  . Hypertension Other   . Diabetes Mellitus II Other     No Known Allergies  OBJECTIVE: Blood pressure (!) 113/56, pulse 73, temperature 100.3 F (37.9 C), temperature source Rectal, resp. rate 16, height  (1.778 m), weight 117.9 kg, SpO2 99 %.  Physical Exam Constitutional:      General: He is not in acute distress.    Appearance: He is well-developed.  Cardiovascular:     Rate and Rhythm: Normal rate and regular rhythm.     Heart sounds: Normal heart sounds.  Pulmonary:     Effort: Pulmonary effort is normal.     Breath sounds: Normal breath sounds.  Musculoskeletal:     Comments: Right BKA; Left transmetatarsal amputation wrapped in dressing that is clean and dry.   Skin:    General: Skin is warm and dry.  Neurological:     Mental Status: He is  alert and oriented to person, place, and time.  Psychiatric:        Mood and Affect: Mood is depressed.        Thought Content: Thought content normal.        Judgment: Judgment normal.     Lab Results Lab Results  Component Value Date   WBC 17.1 (H) 10/05/2018   HGB 10.4 (L) 10/05/2018   HCT 30.9 (L) 10/05/2018   MCV 93.9 10/05/2018   PLT 138 (L) 10/05/2018    Lab Results  Component Value Date   CREATININE 0.95 10/05/2018   BUN 14 10/05/2018   NA 136 10/05/2018   K 2.9 (L) 10/05/2018   CL 106 10/05/2018   CO2 19 (L) 10/05/2018    Lab Results  Component Value Date   ALT 13 10/05/2018   AST 20 10/05/2018   ALKPHOS 90 10/05/2018   BILITOT 3.0 (H) 10/05/2018     Microbiology: Recent Results (from the past 240 hour(s))  Blood Culture (routine x 2)     Status:  None (Preliminary result)   Collection Time: 10/04/18 10:34 PM  Result Value Ref Range Status   Specimen Description BLOOD LEFT HAND  Final   Special Requests   Final    BOTTLES DRAWN AEROBIC AND ANAEROBIC Blood Culture adequate volume   Culture  Setup Time   Final    GRAM POSITIVE COCCI AEROBIC BOTTLE ONLY Organism ID to follow CRITICAL RESULT CALLED TO, READ BACK BY AND VERIFIED WITH: PHRMD M PHAM  10/05/2018 BY S GEZAHEGN Performed at [facility], [address] [place], [place] [address]    Culture GRAM POSITIVE COCCI  Final   Report Status PENDING  Incomplete  Blood Culture ID Panel (Reflexed)     Status: Abnormal   Collection Time: 10/04/18 10:34 PM  Result Value Ref Range Status   Enterococcus species NOT DETECTED NOT DETECTED Final   Listeria monocytogenes NOT DETECTED NOT DETECTED Final   Staphylococcus species DETECTED (A) NOT DETECTED Final    Comment: CRITICAL RESULT CALLED TO, READ BACK BY AND VERIFIED WITH: PHRMD M PHAM  10/05/2018 BY S GEZAHEGN    Staphylococcus aureus (BCID) DETECTED (A) NOT DETECTED Final    Comment: Methicillin (oxacillin) susceptible Staphylococcus aureus (MSSA). Preferred therapy is anti staphylococcal beta lactam antibiotic (Cefazolin or Nafcillin), unless clinically contraindicated. CRITICAL RESULT CALLED TO, READ BACK BY AND VERIFIED WITH: PHRMD M PHAM  10/05/2018 BY S GEZAHEGN    Methicillin resistance NOT DETECTED NOT DETECTED Final   Streptococcus species DETECTED (A) NOT DETECTED Final    Comment: Not Enterococcus species, Streptococcus agalactiae, Streptococcus pyogenes, or Streptococcus pneumoniae. CRITICAL RESULT CALLED TO, READ BACK BY AND VERIFIED WITH: PHRMD M PHAM  10/05/2018 BY S GEZAHEGN    Streptococcus agalactiae NOT DETECTED NOT DETECTED Final   Streptococcus pneumoniae NOT DETECTED NOT DETECTED Final   Streptococcus pyogenes NOT DETECTED NOT DETECTED Final   Acinetobacter baumannii NOT DETECTED NOT  DETECTED Final   Enterobacteriaceae species NOT DETECTED NOT DETECTED Final   Enterobacter cloacae complex NOT DETECTED NOT DETECTED Final   Escherichia coli NOT DETECTED NOT DETECTED Final   Klebsiella oxytoca NOT DETECTED NOT DETECTED Final   Klebsiella pneumoniae NOT DETECTED NOT DETECTED Final   Proteus species NOT DETECTED NOT DETECTED Final   Serratia marcescens NOT DETECTED NOT DETECTED Final   Haemophilus influenzae NOT DETECTED NOT DETECTED Final   Neisseria meningitidis NOT DETECTED NOT DETECTED Final   Pseudomonas aeruginosa NOT DETECTED NOT DETECTED  Final   Candida albicans NOT DETECTED NOT DETECTED Final   Candida glabrata NOT DETECTED NOT DETECTED Final   Candida krusei NOT DETECTED NOT DETECTED Final   Candida parapsilosis NOT DETECTED NOT DETECTED Final   Candida tropicalis NOT DETECTED NOT DETECTED Final    Comment: Performed at [facility], [address] [place], [place] [address]  Blood Culture (routine x 2)     Status: None (Preliminary result)   Collection Time: 10/04/18 10:35 PM  Result Value Ref Range Status   Specimen Description BLOOD LEFT ANTECUBITAL  Final   Special Requests   Final    BOTTLES DRAWN AEROBIC ONLY Blood Culture results may not be optimal due to an inadequate volume of blood received in culture bottles   Culture   Final    NO GROWTH < 24 HOURS Performed at [facility], [address] [place], [place] [address]    Report Status PENDING  Incomplete     Marcos Eke, NP [facility] [phone] Pager  10/05/2018  2:13 PM

## 2018-10-05 NOTE — Consult Note (Signed)
WOC Nurse wound consult note Reason for Consult:Surgical wound left foot. Transmetatarsal amputation 09/28/18.  X ray is negative for osteomyelitis .  Dressing is removed and periwound is macerated.  Sutures are intact at this time.  Wound oozes with cleansing. Absorptive sponge is placed over medial suture line and xeroform to left and right suture line.    Wound type:Surgical wound Pressure Injury POA:NA Measurement: 14 cm suture line, macerated and weakly approximated edges. Moderate serosanguinous oozing Wound HRC:BULA Drainage (amount, consistency, odor) moderate serosanguinous  No odor Periwound:macerated and friable.  Dressing procedure/placement/frequency: Cleanse left foot wound with NS and pat gently dry.  Apply Xeroform gauze to wound bed.  Cover with dry dressing, kerlix and tape.  Change daily.  Will not follow at this time.  Please re-consult if needed.  Maple Hudson MSN, RN, FNP-BC CWON Wound, Ostomy, Continence Nurse Pager [phone]

## 2018-10-05 NOTE — ED Notes (Signed)
Pt CBG in AM 147. RN notified.

## 2018-10-05 NOTE — ED Notes (Signed)
Pt is refusing to do an in and out cath. He said he will pee when he is ready.

## 2018-10-05 NOTE — Plan of Care (Signed)

## 2018-10-05 NOTE — Progress Notes (Addendum)
Patient became tearful when giving evening meds. He stated, "I am tired of this. I want to kill myself and I have for while. I am suicidal thoughts." She states he doesn't know if he has a plan. He would really like to speak with someone. He states he had been in drug rehab until recently. When d/c he went right back to using cocaine, which he has used for 40 years. Covering provider notified. Order for Chaplain placed. Will place patient on SI precautions.

## 2018-10-05 NOTE — H&P (Addendum)
History and Physical    Cory Palmer XBW:[id number] DOB: [birth year] DOA: 10/04/2018  PCP: Patient, [name] Per   Patient coming from: Homeless.  I have personally briefly reviewed patient's old medical records in [facility] Link  Chief Complaint: Chest pain.  HPI: Cory Palmer is a 58 y.o. male with medical history significant of anxiety,  depressed bipolar disorder, type 2 diabetes, hypertension who is coming to the emergency department with complaints of on and off chest pain for several weeks, but very intense tonight.  Associated symptoms were dyspnea, diaphoresis, palpitations, abdominal pain, nausea and vomiting.  He recently had his left foot partially amputated, but complains of bleeding in the area after he walked.  He reports having a fever at home. However, denies diarrhea, dysuria, frequency or hematuria.  He denies polyuria, polydipsia, polyphagia or blurred vision.  ED Course: Initial vital signs temperature 99.7 F, pulse 94, respirations 22, blood pressure 142/93 mmHg and O2 sat 100% on room air.  Patient received IV fluids and IV antibiotics in the ED.  White count is 19.7 with 84% neutrophils, 7% lymphocytes and 7% monocytes.  Hemoglobin 12.4 g/dL and platelets 163.  Lactic acid was 2.6 and after IV fluids was 1.8 mmol/L.  Lipase was normal.  Potassium was 3.4 and CO2 18 mmol/L.  Glucose 228, calcium 8.5 and total bilirubin 5.2 mg/dL.  All other values are within normal limits.  Blood cultures x2 were drawn.  Influenza a and B by PCR was normal.  Imaging: Chest radiograph does not show any acute cardiopulmonary pathology.  Left foot x-ray shows transmetatarsal amputation without evidence of osteomyelitis.  Please see images and full radiology report for further detail.  Review of Systems: As per HPI otherwise 10 point review of systems negative.   Past Medical History:  Diagnosis Date  . Anxiety   . Depressed bipolar disorder (HCC)   . Diabetes mellitus without  complication (HCC)   . Hypertension         Past Surgical History:  Procedure Laterality Date  . AMPUTATION Left 09/28/2018   Procedure: LEFT TRANSMETATARSAL AMPUTATION;  Surgeon: Nadara Mustard, MD;  Location: [facility] OR;  Service: Orthopedics;  Laterality: Left;  . BELOW KNEE LEG AMPUTATION Right      reports that he has been smoking cigarettes. He has never used smokeless tobacco. He reports current alcohol use. He reports previous drug use.  No Known Allergies  Family History  Problem Relation Age of Onset  . Hypertension Other   . Diabetes Mellitus II Other    Prior to Admission medications   Medication Sig Start Date End Date Taking? Authorizing Provider  acetaminophen (TYLENOL) 325 MG tablet Take 2 tablets (650 mg total) by mouth every 6 (six) hours as needed for mild pain (or Fever >/= 101). 05/10/18   Danford, Earl Lites, MD  doxycycline (VIBRAMYCIN) 100 MG capsule Take 1 capsule (100 mg total) by mouth 2 (two) times daily. 08/28/18   Ward, Layla Maw, DO  feeding supplement, GLUCERNA SHAKE, (GLUCERNA SHAKE) LIQD Take 237 mLs by mouth 2 (two) times daily between meals. 05/10/18   Danford, Earl Lites, MD  metFORMIN (GLUCOPHAGE) 1000 MG tablet Take 1 tablet (1,000 mg total) by mouth 2 (two) times daily with a meal. 08/28/18   Ward, Layla Maw, DO  Multiple Vitamin (MULTIVITAMIN WITH MINERALS) TABS tablet Take 1 tablet by mouth daily. 05/11/18   Danford, Earl Lites, MD  QUEtiapine (SEROQUEL) 400 MG tablet Take 1 tablet (400 mg  total) by mouth at bedtime. 08/28/18   Ward, Layla Maw, DO  traZODone (DESYREL) 100 MG tablet Take 1 tablet (100 mg total) by mouth at bedtime. 08/28/18   Ward, Layla Maw, DO    Physical Exam: Vitals:   10/04/18 2230 10/04/18 2245 10/04/18 2330 10/05/18 0015  BP: (!) 144/88 133/83 124/87 (!) 145/78  Pulse: 92 96 95 88  Resp: (!) 28 (!) 26 (!) 28 (!) 22  Temp:      TempSrc:      SpO2: 100% 98% 98% 94%  Weight:      Height:        Constitutional:  Looks acutely ill. Eyes: PERRL, lids and conjunctivae normal.  Sclera mildly injected. ENMT: Mucous membranes are dry. Posterior pharynx clear of any exudate or lesions. Neck: normal, supple, no masses, no thyromegaly Respiratory: Decreased breath sounds on bases, otherwise clear to auscultation bilaterally, no wheezing, no crackles. Normal respiratory effort. No accessory muscle use.  Cardiovascular: Regular rate and rhythm, no murmurs / rubs / gallops. No extremity edema. 2+ pedal pulses. No carotid bruits.  Abdomen: Soft, positive epigastric tenderness, no masses palpated. No hepatosplenomegaly. Bowel sounds positive.  Musculoskeletal: no clubbing / cyanosis.  Left transmetatarsal amputation.  Good ROM, no contractures. Normal muscle tone.  Skin: Left foot dressing in place.  No discharge or bleeding seen. Neurologic: CN 2-12 grossly intact. Sensation intact, DTR normal. Strength 5/5 in all 4.  Psychiatric: Normal judgment and insight. Alert and oriented x 3. Normal mood.   Labs on Admission: I have personally reviewed following labs and imaging studies  CBC: Recent Labs  Lab 09/28/18 0719 10/04/18 2231  WBC 6.8 19.7*  NEUTROABS  --  16.7*  HGB 12.7* 12.4*  HCT 38.8* 36.4*  MCV 97.7 93.8  PLT 85* 182   Basic Metabolic Panel: Recent Labs  Lab 09/28/18 0719 10/04/18 2231  NA 134* 135  K 3.8 3.4*  CL 106 108  CO2 16* 18*  GLUCOSE 102* 220*  BUN 8 14  CREATININE 0.78 0.96  CALCIUM 8.9 8.5*   GFR: Estimated Creatinine Clearance: 108 mL/min (by C-G formula based on SCr of 0.96 mg/dL). Liver Function Tests: Recent Labs  Lab 10/04/18 2231  AST 28  ALT 14  ALKPHOS 112  BILITOT 5.2*  PROT 7.2  ALBUMIN 3.7   Recent Labs  Lab 10/04/18 2231  LIPASE 21   No results for input(s): AMMONIA in the last 168 hours. Coagulation Profile: No results for input(s): INR, PROTIME in the last 168 hours. Cardiac Enzymes: No results for input(s): CKTOTAL, CKMB, CKMBINDEX, TROPONINI  in the last 168 hours. BNP (last 3 results) No results for input(s): PROBNP in the last 8760 hours. HbA1C: No results for input(s): HGBA1C in the last 72 hours. CBG: Recent Labs  Lab 09/28/18 1230 09/28/18 1631 09/28/18 2145 09/29/18 0618 09/29/18 0818  GLUCAP 80 108* 135* 83 87   Lipid Profile: No results for input(s): CHOL, HDL, LDLCALC, TRIG, CHOLHDL, LDLDIRECT in the last 72 hours. Thyroid Function Tests: No results for input(s): TSH, T4TOTAL, FREET4, T3FREE, THYROIDAB in the last 72 hours. Anemia Panel: No results for input(s): VITAMINB12, FOLATE, FERRITIN, TIBC, IRON, RETICCTPCT in the last 72 hours. Urine analysis: No results found for: COLORURINE, APPEARANCEUR, LABSPEC, PHURINE, GLUCOSEU, HGBUR, BILIRUBINUR, KETONESUR, PROTEINUR, UROBILINOGEN, NITRITE, LEUKOCYTESUR  Radiological Exams on Admission: Dg Chest 2 View  Result Date: 10/04/2018 CLINICAL DATA:  Cough for 4 days EXAM: CHEST - 2 VIEW COMPARISON:  05/08/2018 FINDINGS: Mild cardiomegaly. No overt  pulmonary edema. Both lungs are clear. The visualized skeletal structures are unremarkable. IMPRESSION: No active cardiopulmonary disease.  Mild cardiomegaly Electronically Signed   By: Jasmine Pang M.D.   On: 10/04/2018 22:52   Dg Foot Complete Left  Result Date: 10/05/2018 CLINICAL DATA:  Recent foot surgery possible wound infection. EXAM: LEFT FOOT - COMPLETE 3+ VIEW COMPARISON:  Preoperative radiograph 09/24/2018 FINDINGS: Post transmetatarsal amputation of foot. The resection margins are sharp. No periosteal reaction or bony destructive change. Three linear foreign bodies projecting over the foot are unchanged prior exam, likely in the soft tissues, less likely external to the patient given persistence on pre and postoperative imaging. No tracking soft tissue air. Overlying dressing in place. IMPRESSION: Post transmetatarsal amputation of the foot. No radiographic findings of osteomyelitis. Electronically Signed   By: Narda Rutherford M.D.   On: 10/05/2018 00:04   Echo 05/08/2018 ------------------------------------------------------------------ LV EF: 55% -   60%  ------------------------------------------------------------------- Indications:      CHF - 428.0.  ------------------------------------------------------------------- History:   Risk factors:  Diabetes mellitus.  ------------------------------------------------------------------- Study Conclusions  - Left ventricle: The cavity size was mildly dilated. Wall   thickness was increased in a pattern of mild LVH. Systolic   function was normal. The estimated ejection fraction was in the   range of 55% to 60%. Wall motion was normal; there were no   regional wall motion abnormalities. Left ventricular diastolic   function parameters were normal. - Mitral valve: Calcified annulus. - Left atrium: The atrium was moderately dilated.  Impressions:  - Normal LV function; mild LVE; mild LVH; moderate LAE.   EKG: Independently reviewed. No tracing available.  Assessment/Plan Principal Problem:   Sepsis due to undetermined organism (HCC) Admit to progressive unit/inpatient. Supplemental oxygen as needed. Continue IV fluids. Continue cefepime per pharmacy. Continue vancomycin per pharmacy. Continue metronidazole 500 mg IVPB every 8 hours. Follow-up CBC, renal and hepatic function. Follow-up blood culture and sensitivity.  Active Problems:   Chest pain Seems to be atypical. Trend troponin level. Repeat EKG.    Homelessness Consult social services.    DM (diabetes mellitus), type 2 with complications (HCC) Carbohydrate modified diet. Continue metformin. CBG monitoring regular insulin sliding scale.    S/P transmetatarsal amputation of foot, left (HCC) Continue local care. Consult wound care.    Depressed bipolar disorder (HCC) Resume Seroquel and trazodone.    Hypertension Not on antihypertensives.    DVT prophylaxis:  Lovenox SQ. Code Status: Full code. Family Communication: None at bedside Disposition Plan: Admit for IV antibiotics for 2 to 3 days. Consults called:  Admission status: Inpatient/progressive pain   Bobette Mo MD [facility]   10/05/2018, 12:44 AM   This document was prepared using Dragon voice recognition software and may contain some unintended transcription errors.

## 2018-10-05 NOTE — Progress Notes (Signed)
  Echocardiogram 2D Echocardiogram has been performed.  Gerda Diss 10/05/2018, 5:22 PM

## 2018-10-05 NOTE — ED Notes (Signed)
Jeniya(SR)Lunch Tray Ordered-called @ 0957-per Katie, RN-called by Marylene Land

## 2018-10-05 NOTE — Progress Notes (Addendum)
Homeless Patient is admitted early this am due to ongoing chest pain for a few weeks, troponin negative .  He is s/p left partial foot amputation on 2/28, Dr [name] saw his in the office on 3/2, per Dr [name] patient walked into his clinic without shoes on. He is found to have sepsis, bacteremia , he is started on abx, ID consulted.  H/o substance abuse, reports was at a detox facility in [place] several months ago, he returned to [place], he started to use cocaine again.

## 2018-10-06 DIAGNOSIS — E876 Hypokalemia: Secondary | ICD-10-CM

## 2018-10-06 DIAGNOSIS — Z590 Homelessness: Secondary | ICD-10-CM

## 2018-10-06 DIAGNOSIS — R7881 Bacteremia: Secondary | ICD-10-CM

## 2018-10-06 DIAGNOSIS — L089 Local infection of the skin and subcutaneous tissue, unspecified: Secondary | ICD-10-CM

## 2018-10-06 DIAGNOSIS — E118 Type 2 diabetes mellitus with unspecified complications: Secondary | ICD-10-CM

## 2018-10-06 DIAGNOSIS — Z89432 Acquired absence of left foot: Secondary | ICD-10-CM

## 2018-10-06 DIAGNOSIS — R451 Restlessness and agitation: Secondary | ICD-10-CM

## 2018-10-06 LAB — GLUCOSE, CAPILLARY
GLUCOSE-CAPILLARY: 114 mg/dL — AB (ref 70–99)
Glucose-Capillary: 109 mg/dL — ABNORMAL HIGH (ref 70–99)
Glucose-Capillary: 134 mg/dL — ABNORMAL HIGH (ref 70–99)
Glucose-Capillary: 119 mg/dL — ABNORMAL HIGH (ref 70–99)

## 2018-10-06 LAB — CBC WITH DIFFERENTIAL/PLATELET
Abs Immature Granulocytes: 0.04 10*3/uL (ref 0.00–0.07)
Basophils Absolute: 0 10*3/uL (ref 0.0–0.1)
Basophils Relative: 1 %
Eosinophils Absolute: 0.3 10*3/uL (ref 0.0–0.5)
Eosinophils Relative: 5 %
HCT: 24.9 % — ABNORMAL LOW (ref 39.0–52.0)
Hemoglobin: 8.6 g/dL — ABNORMAL LOW (ref 13.0–17.0)
Immature Granulocytes: 1 %
Lymphocytes Relative: 20 %
Lymphs Abs: 1.1 10*3/uL (ref 0.7–4.0)
MCH: 32.5 pg (ref 26.0–34.0)
MCHC: 34.5 g/dL (ref 30.0–36.0)
MCV: 94 fL (ref 80.0–100.0)
Monocytes Absolute: 0.5 10*3/uL (ref 0.1–1.0)
Monocytes Relative: 9 %
Neutro Abs: 3.7 10*3/uL (ref 1.7–7.7)
Neutrophils Relative %: 64 %
Platelets: 87 10*3/uL — ABNORMAL LOW (ref 150–400)
RBC: 2.65 MIL/uL — ABNORMAL LOW (ref 4.22–5.81)
RDW: 13.6 % (ref 11.5–15.5)
WBC: 5.6 10*3/uL (ref 4.0–10.5)
nRBC: 0 % (ref 0.0–0.2)

## 2018-10-06 LAB — BASIC METABOLIC PANEL
Anion gap: 8 (ref 5–15)
BUN: 11 mg/dL (ref 6–20)
CALCIUM: 7.8 mg/dL — AB (ref 8.9–10.3)
CO2: 19 mmol/L — AB (ref 22–32)
Chloride: 109 mmol/L (ref 98–111)
Creatinine, Ser: 0.73 mg/dL (ref 0.61–1.24)
GFR calc Af Amer: >60 mL/min (ref >60)
GFR calc non Af Amer: >60 mL/min (ref >60)
Glucose, Bld: 125 mg/dL — ABNORMAL HIGH (ref 70–99)
Potassium: 3.4 mmol/L — ABNORMAL LOW (ref 3.5–5.1)
Sodium: 136 mmol/L (ref 135–145)

## 2018-10-06 LAB — HEPATITIS PANEL, ACUTE
HCV Ab: <0.1 s/co ratio (ref 0.0–0.9)
HEP B S AG: NEGATIVE
Hep A IgM: NEGATIVE
Hep B C IgM: NEGATIVE

## 2018-10-06 LAB — MAGNESIUM: Magnesium: 1.6 mg/dL — ABNORMAL LOW (ref 1.7–2.4)

## 2018-10-06 LAB — HIV ANTIBODY (ROUTINE TESTING W REFLEX): HIV Screen 4th Generation wRfx: NONREACTIVE

## 2018-10-06 MED ORDER — SODIUM CHLORIDE 0.9 % IV BOLUS
1000.0000 mL | Freq: Once | INTRAVENOUS | Status: AC
  Administered 2018-10-06: 1000 mL via INTRAVENOUS

## 2018-10-06 MED ORDER — TAMSULOSIN HCL 0.4 MG PO CAPS
0.4000 mg | ORAL_CAPSULE | Freq: Every day | ORAL | Status: DC
  Administered 2018-10-06 – 2018-10-17 (×12): 0.4 mg via ORAL
  Filled 2018-10-06 (×12): qty 1

## 2018-10-06 MED ORDER — MAGNESIUM SULFATE 2 GM/50ML IV SOLN
2.0000 g | Freq: Once | INTRAVENOUS | Status: AC
  Administered 2018-10-06: 2 g via INTRAVENOUS
  Filled 2018-10-06: qty 50

## 2018-10-06 MED ORDER — SENNOSIDES-DOCUSATE SODIUM 8.6-50 MG PO TABS
1.0000 | ORAL_TABLET | Freq: Two times a day (BID) | ORAL | Status: DC
  Administered 2018-10-06 – 2018-10-18 (×24): 1 via ORAL
  Filled 2018-10-06 (×24): qty 1

## 2018-10-06 MED ORDER — POTASSIUM CHLORIDE CRYS ER 20 MEQ PO TBCR
40.0000 meq | EXTENDED_RELEASE_TABLET | Freq: Once | ORAL | Status: AC
  Administered 2018-10-06: 40 meq via ORAL
  Filled 2018-10-06: qty 2

## 2018-10-06 MED ORDER — POLYETHYLENE GLYCOL 3350 17 G PO PACK
17.0000 g | PACK | Freq: Every day | ORAL | Status: DC
  Administered 2018-10-06 – 2018-10-07 (×2): 17 g via ORAL
  Filled 2018-10-06 (×2): qty 1

## 2018-10-06 NOTE — Progress Notes (Addendum)
Security arrived to scan/wand patient approximately midnight; informed security patient hypotensive with amputations therefore not safe and difficult to have patient get OOB and stand to be wanded. This RN inquired if patient could be wanded while in bed; security indicated that bed would set alarm off and that given that patient was in briefs only it was ok not to wand/scan him.   Paged MD Bodenheimer at [phone] about patient BP 89/56 and 82/42 patient A&Ox4 with no distress. Received orders for 1L bolus; bolus administered and patient informed of need to administer bolus. Notified Bodenheimer of BP 99/69 and 102/68 after 1L bolus. Will continue to monitor.   Page MD Bodenheimer at [phone] patient BP 85/42 automatic and 87/58 manual. HR 82. A&Ox4, no distress. Second 1L bolus administered per orders.  Informed patient of need to administer second bolus.   Bladder scan at 0300 indicated 568 mL urine. Received order for I&O cath. Informed patient of need to perform I&O cath and patient stated did not want cath since had prior cath and it hurt. Patient also stated he did not come here for this.  This RN educated patient on importance of cath; to prevent backflow of urine, to prevent bladder distention and assist with voiding since patient had not been able to void on his own and had received IV fluids and bolus for hypotension. Patient became agitated and stated he needed a minute to think and that staff were interrupting his sleep. NT assisted with educating patient on need for cath and NT and this RN offered patient urinal to attempt to void. Patient continued to decline cath.

## 2018-10-06 NOTE — Progress Notes (Signed)
PROGRESS NOTE  Cory Palmer JTT:[id number] DOB: 09-14-1960 DOA: 10/04/2018 PCP: Patient, [name] Per  HPI/Recap of past 24 hours:  Has suicidal though last night, sitter in room bp was low last night, he received fluids bolus  He denies pain this am, no fever,  Sitter in room  Assessment/Plan: Principal Problem:   Sepsis due to undetermined organism (HCC) Active Problems:   Homelessness   DM (diabetes mellitus), type 2 with complications (HCC)   S/P transmetatarsal amputation of foot, left (HCC)   Depressed bipolar disorder (HCC)   Hypertension   Hyperbilirubinemia    Sepsis, mssa and strep bacteremia -on ancef, ID following  S/p transmetatarsal amputation of foot, left Wound care  Afib with intermittent short pauses, does not appear to have symptom (likely new diagnosis) -continue tele -tsh unremarkable, keep k>4, mag>2 -he has anemia and thrombocytopenia, now a good candidate for anticoagulation Echo with adequate ef -monitor  Normocytic anemia,Chronic thrombocytopenia  Monitor  Hypokalemia/hypomagnesemia Replace k/mag  noninsulin dependent diabetes A1c6.6 Home meds metformin held On ssi here  Bipolar /depression/ SI seroquel Wilfrid Lund in room Psychiatry consult  Homeless /Polysubstance abuse (cocaine) Child psychotherapist consult   Code Status: full  Family Communication: patient   Disposition Plan: not ready to discharge   Consultants:  Psych  ID  Procedures:  none  Antibiotics:  Vanc/flagyl/cefepime x1 on admission  Ancef from 3/6   Objective: BP (!) 92/55   Pulse 82   Temp 98.5 F (36.9 C) (Oral)   Resp 18   Ht 5\' 10"  (1.778 m)   Wt 117.1 kg   SpO2 93%   BMI 37.04 kg/m   Intake/Output Summary (Last 24 hours) at 10/06/2018 0755 Last data filed at 10/05/2018 1800 Gross per 24 hour  Intake 2372.92 ml  Output -  Net 2372.92 ml   Filed Weights   10/04/18 2228 10/05/18 1515  Weight: 117.9 kg 117.1 kg     Exam: Patient is examined daily including today on 10/06/2018, exams remain the same as of yesterday except that has changed    General:  NAD  Cardiovascular: RRR  Respiratory: CTABL  Abdomen: Soft/ND/NT, positive BS  Musculoskeletal: No Edema, left foot amputation   Neuro: alert, oriented   Data Reviewed: Basic Metabolic Panel: Recent Labs  Lab 10/04/18 2231 10/05/18 0618 10/06/18 0237  NA 135 136 136  K 3.4* 2.9* 3.4*  CL 108 106 109  CO2 18* 19* 19*  GLUCOSE 220* 169* 125*  BUN 14 14 11   CREATININE 0.96 0.95 0.73  CALCIUM 8.5* 8.2* 7.8*  MG  --   --  1.6*   Liver Function Tests: Recent Labs  Lab 10/04/18 2231 10/05/18 0618  AST 28 20  ALT 14 13  ALKPHOS 112 90  BILITOT 5.2* 3.0*  PROT 7.2 6.2*  ALBUMIN 3.7 3.1*   Recent Labs  Lab 10/04/18 2231  LIPASE 21   No results for input(s): AMMONIA in the last 168 hours. CBC: Recent Labs  Lab 10/04/18 2231 10/05/18 0618 10/06/18 0237  WBC 19.7* 17.1* 5.6  NEUTROABS 16.7* 12.5* 3.7  HGB 12.4* 10.4* 8.6*  HCT 36.4* 30.9* 24.9*  MCV 93.8 93.9 94.0  PLT 182 138* 87*   Cardiac Enzymes:   Recent Labs  Lab 10/05/18 0618 10/05/18 1207  TROPONINI <0.03 <0.03   BNP (last 3 results) No results for input(s): BNP in the last 8760 hours.  ProBNP (last 3 results) No results for input(s): PROBNP in the last 8760 hours.  CBG: Recent Labs  Lab 09/29/18 0818 10/05/18 0717 10/05/18 1154 10/05/18 1723 10/06/18 0724  GLUCAP 87 147* 139* 143* 109*    Recent Results (from the past 240 hour(s))  Blood Culture (routine x 2)     Status: None (Preliminary result)   Collection Time: 10/04/18 10:34 PM  Result Value Ref Range Status   Specimen Description BLOOD LEFT HAND  Final   Special Requests   Final    BOTTLES DRAWN AEROBIC AND ANAEROBIC Blood Culture adequate volume   Culture  Setup Time   Final    GRAM POSITIVE COCCI AEROBIC BOTTLE ONLY Organism ID to follow CRITICAL RESULT CALLED TO, READ BACK  BY AND VERIFIED WITH: PHRMD M PHAM @1356  10/05/2018 BY S GEZAHEGN Performed at [facility], [address] [place], [place] [address]    Culture GRAM POSITIVE COCCI  Final   Report Status PENDING  Incomplete  Blood Culture ID Panel (Reflexed)     Status: Abnormal   Collection Time: 10/04/18 10:34 PM  Result Value Ref Range Status   Enterococcus species NOT DETECTED NOT DETECTED Final   Listeria monocytogenes NOT DETECTED NOT DETECTED Final   Staphylococcus species DETECTED (A) NOT DETECTED Final    Comment: CRITICAL RESULT CALLED TO, READ BACK BY AND VERIFIED WITH: PHRMD M PHAM @1356  10/05/2018 BY S GEZAHEGN    Staphylococcus aureus (BCID) DETECTED (A) NOT DETECTED Final    Comment: Methicillin (oxacillin) susceptible Staphylococcus aureus (MSSA). Preferred therapy is anti staphylococcal beta lactam antibiotic (Cefazolin or Nafcillin), unless clinically contraindicated. CRITICAL RESULT CALLED TO, READ BACK BY AND VERIFIED WITH: PHRMD M PHAM @1356  10/05/2018 BY S GEZAHEGN    Methicillin resistance NOT DETECTED NOT DETECTED Final   Streptococcus species DETECTED (A) NOT DETECTED Final    Comment: Not Enterococcus species, Streptococcus agalactiae, Streptococcus pyogenes, or Streptococcus pneumoniae. CRITICAL RESULT CALLED TO, READ BACK BY AND VERIFIED WITH: PHRMD M PHAM @1356  10/05/2018 BY S GEZAHEGN    Streptococcus agalactiae NOT DETECTED NOT DETECTED Final   Streptococcus pneumoniae NOT DETECTED NOT DETECTED Final   Streptococcus pyogenes NOT DETECTED NOT DETECTED Final   Acinetobacter baumannii NOT DETECTED NOT DETECTED Final   Enterobacteriaceae species NOT DETECTED NOT DETECTED Final   Enterobacter cloacae complex NOT DETECTED NOT DETECTED Final   Escherichia coli NOT DETECTED NOT DETECTED Final   Klebsiella oxytoca NOT DETECTED NOT DETECTED Final   Klebsiella pneumoniae NOT DETECTED NOT DETECTED Final   Proteus species NOT DETECTED NOT DETECTED Final   Serratia marcescens NOT  DETECTED NOT DETECTED Final   Haemophilus influenzae NOT DETECTED NOT DETECTED Final   Neisseria meningitidis NOT DETECTED NOT DETECTED Final   Pseudomonas aeruginosa NOT DETECTED NOT DETECTED Final   Candida albicans NOT DETECTED NOT DETECTED Final   Candida glabrata NOT DETECTED NOT DETECTED Final   Candida krusei NOT DETECTED NOT DETECTED Final   Candida parapsilosis NOT DETECTED NOT DETECTED Final   Candida tropicalis NOT DETECTED NOT DETECTED Final    Comment: Performed at [facility], [address], [place], [place] [address]  Blood Culture (routine x 2)     Status: None (Preliminary result)   Collection Time: 10/04/18 10:35 PM  Result Value Ref Range Status   Specimen Description BLOOD LEFT ANTECUBITAL  Final   Special Requests   Final    BOTTLES DRAWN AEROBIC ONLY Blood Culture results may not be optimal due to an inadequate volume of blood received in culture bottles   Culture   Final  NO GROWTH < 24 HOURS Performed at [facility], [address] [place], [place] [address]    Report Status PENDING  Incomplete     Studies: No results found.  Scheduled Meds: . enoxaparin (LOVENOX) injection  60 mg Subcutaneous Q24H  . insulin aspart  0-20 Units Subcutaneous TID WC  . potassium chloride  40 mEq Oral Once  . QUEtiapine  400 mg Oral QHS  . tamsulosin  0.4 mg Oral QPC supper  . traZODone  100 mg Oral QHS    Continuous Infusions: . sodium chloride 125 mL/hr at 10/06/18 0646  .  ceFAZolin (ANCEF) IV 2 g (10/06/18 0658)  . magnesium sulfate 1 - 4 g bolus IVPB       Time spent: 35mins I have personally reviewed and interpreted on  10/06/2018 daily labs, tele strips, imagings as discussed above under date review session and assessment and plans.  I reviewed all nursing notes, pharmacy notes, consultant notes,  vitals, pertinent old records  I have discussed plan of care as described above with RN , patient on 10/06/2018   Albertine GratesFang Rosalyn Archambault MD, PhD  [facility] Pager [phone]. If 7PM-7AM, please contact night-coverage at [email or web address], password [email or web address] 10/06/2018, 7:55 AM  LOS: 1 day

## 2018-10-06 NOTE — Progress Notes (Signed)
[facility]   Reason for visit: Follow up on bacteremia  Interval History: no new positive culture growth, stil 1/4 bottles positive; repeat cultures sent.  WBC down to 5.6; remains afebrile.  No new issues.     Physical Exam: Constitutional:  Vitals:   10/06/18 1208 10/06/18 1425  BP: 99/66 114/84  Pulse: 74 76  Resp: (!) 24 (!) 22  Temp: 97.9 F (36.6 C) 98.7 F (37.1 C)  SpO2: 95% 98%   patient appears in NAD Eyes: anicteric Respiratory: Normal respiratory effort; CTA B Cardiovascular: RRR GI: soft, nt, nd MS: left foot wrapped  Review of Systems: Constitutional: negative for fevers and chills Respiratory: negative for cough or sputum Gastrointestinal: negative for nausea and diarrhea  Lab Results  Component Value Date   WBC 5.6 10/06/2018   HGB 8.6 (L) 10/06/2018   HCT 24.9 (L) 10/06/2018   MCV 94.0 10/06/2018   PLT 87 (L) 10/06/2018    Lab Results  Component Value Date   CREATININE 0.73 10/06/2018   BUN 11 10/06/2018   NA 136 10/06/2018   K 3.4 (L) 10/06/2018   CL 109 10/06/2018   CO2 19 (L) 10/06/2018    Lab Results  Component Value Date   ALT 13 10/05/2018   AST 20 10/05/2018   ALKPHOS 90 10/05/2018     Microbiology: Recent Results (from the past 240 hour(s))  Blood Culture (routine x 2)     Status: Abnormal (Preliminary result)   Collection Time: 10/04/18 10:34 PM  Result Value Ref Range Status   Specimen Description BLOOD LEFT HAND  Final   Special Requests   Final    BOTTLES DRAWN AEROBIC AND ANAEROBIC Blood Culture adequate volume   Culture  Setup Time   Final    GRAM POSITIVE COCCI AEROBIC BOTTLE ONLY Organism ID to follow CRITICAL RESULT CALLED TO, READ BACK BY AND VERIFIED WITH: PHRMD M PHAM @1356  10/05/2018 BY S GEZAHEGN    Culture (A)  Final    STAPHYLOCOCCUS AUREUS CULTURE REINCUBATED FOR BETTER GROWTH STREPTOCOCCUS GROUP C SUSCEPTIBILITIES TO FOLLOW Performed at [facility], [address], [place], [place] [address]    Report Status PENDING  Incomplete  Blood Culture ID Panel (Reflexed)     Status: Abnormal   Collection Time: 10/04/18 10:34 PM  Result Value Ref Range Status   Enterococcus species NOT DETECTED NOT DETECTED Final   Listeria monocytogenes NOT DETECTED NOT DETECTED Final   Staphylococcus species DETECTED (A) NOT DETECTED Final    Comment: CRITICAL RESULT CALLED TO, READ BACK BY AND VERIFIED WITH: PHRMD M PHAM @1356  10/05/2018 BY S GEZAHEGN    Staphylococcus aureus (BCID) DETECTED (A) NOT DETECTED Final    Comment: Methicillin (oxacillin) susceptible Staphylococcus aureus (MSSA). Preferred therapy is anti staphylococcal beta lactam antibiotic (Cefazolin or Nafcillin), unless clinically contraindicated. CRITICAL RESULT CALLED TO, READ BACK BY AND VERIFIED WITH: PHRMD M PHAM @1356  10/05/2018 BY S GEZAHEGN    Methicillin resistance NOT DETECTED NOT DETECTED Final   Streptococcus species DETECTED (A) NOT DETECTED Final    Comment: Not Enterococcus species, Streptococcus agalactiae, Streptococcus pyogenes, or Streptococcus pneumoniae. CRITICAL RESULT CALLED TO, READ BACK BY AND VERIFIED WITH: PHRMD M PHAM @1356  10/05/2018 BY S GEZAHEGN    Streptococcus agalactiae NOT DETECTED NOT DETECTED Final   Streptococcus pneumoniae NOT DETECTED NOT DETECTED Final   Streptococcus pyogenes NOT DETECTED NOT DETECTED Final   Acinetobacter baumannii NOT DETECTED NOT DETECTED Final  Enterobacteriaceae species NOT DETECTED NOT DETECTED Final   Enterobacter cloacae complex NOT DETECTED NOT DETECTED Final   Escherichia coli NOT DETECTED NOT DETECTED Final   Klebsiella oxytoca NOT DETECTED NOT DETECTED Final   Klebsiella pneumoniae NOT DETECTED NOT DETECTED Final   Proteus species NOT DETECTED NOT DETECTED Final   Serratia marcescens NOT DETECTED NOT DETECTED Final   Haemophilus influenzae NOT DETECTED NOT DETECTED Final   Neisseria meningitidis NOT DETECTED NOT DETECTED Final    Pseudomonas aeruginosa NOT DETECTED NOT DETECTED Final   Candida albicans NOT DETECTED NOT DETECTED Final   Candida glabrata NOT DETECTED NOT DETECTED Final   Candida krusei NOT DETECTED NOT DETECTED Final   Candida parapsilosis NOT DETECTED NOT DETECTED Final   Candida tropicalis NOT DETECTED NOT DETECTED Final    Comment: Performed at [facility], [address] [place], [place] [address]  Blood Culture (routine x 2)     Status: None (Preliminary result)   Collection Time: 10/04/18 10:35 PM  Result Value Ref Range Status   Specimen Description BLOOD LEFT ANTECUBITAL  Final   Special Requests   Final    BOTTLES DRAWN AEROBIC ONLY Blood Culture results may not be optimal due to an inadequate volume of blood received in culture bottles   Culture   Final    NO GROWTH 2 DAYS Performed at [facility], [address] [place], [place] [address]    Report Status PENDING  Incomplete  Culture, blood (routine x 2)     Status: None (Preliminary result)   Collection Time: 10/06/18  2:37 AM  Result Value Ref Range Status   Specimen Description BLOOD RIGHT ANTECUBITAL  Final   Special Requests   Final    BOTTLES DRAWN AEROBIC ONLY Blood Culture results may not be optimal due to an inadequate volume of blood received in culture bottles   Culture   Final    NO GROWTH < 12 HOURS Performed at [facility], [address] [place], [place] [address]    Report Status PENDING  Incomplete  Culture, blood (routine x 2)     Status: None (Preliminary result)   Collection Time: 10/06/18  2:37 AM  Result Value Ref Range Status   Specimen Description BLOOD RIGHT ANTECUBITAL  Final   Special Requests   Final    BOTTLES DRAWN AEROBIC ONLY Blood Culture results may not be optimal due to an inadequate volume of blood received in culture bottles   Culture   Final    NO GROWTH < 12 HOURS Performed at [facility], [address] [place], [place] [address]    Report Status PENDING   Incomplete    Impression/Plan:  1. Bacteremia - no issues noted on TTE.  Minimal bacterial growth so far in blood cultures.   Will likely use oral therapy at discharge, such as keflex.  Will continue to monitor  2.  Soft tissue infection - no osteomyelitis.  Will treat as above.   3. Wound care - will need ongoing wound care as outpatient.

## 2018-10-06 NOTE — Consult Note (Signed)
[facility] Face-to-Face Psychiatry Consult   Reason for Consult:  "Suicidal Ideation" Referring Physician:  Dr. Roda Shutters Patient Identification: Cory Palmer MRN:  [id number] Principal Diagnosis: Sepsis due to undetermined organism [facility]) Diagnosis:  Principal Problem:   Sepsis due to undetermined organism [facility]) Active Problems:   Homelessness   DM (diabetes mellitus), type 2 with complications (HCC)   S/P transmetatarsal amputation of foot, left (HCC)   Depressed bipolar disorder (HCC)   Hypertension   Hyperbilirubinemia   Total Time spent with patient: 45 minutes  Subjective:   Cory Palmer is a 58 y.o. male patient admitted with chest pain.  HPI: Patient with a past medical history of Anxiety, CHF, Cocaine use disorder, Bipolar disorder-depressed type (HCC), Diabetes mellitus without complication (HCC), and Hypertension was admitted to the hospital due to chest pain and sepsis. He has a long history of psychiatric disorder dating back to over 20 years. [name] has been prescribed Seroquel and Trazodone which he reports inconsistency with compliance due to finances and transportation. He reports that he was just verbalizing his frustration to the staff nurse about financial stressors, poor living accomadations, and multiple medical conditions that include a recent amputation. However, he is now reporting that he never claim to be suicidal or homicidal and was just venting about his living condition. Today, he denies psychosis, delusions, suicidal/homicidal ideation, intent or plan but requesting to talk to a social worker who can possibly help with his current living condition.  Past Psychiatric History: as above  Risk to Self:  Denies Risk to Others:  Denies Prior Inpatient Therapy:  Drug rehabilitation Prior Outpatient Therapy:  Yes  Past Medical History:  Past Medical History:  Diagnosis Date  . Anxiety   . CHF (congestive heart failure) (HCC)   . Depressed bipolar disorder (HCC)   .  Diabetes mellitus without complication (HCC)   . Hypertension     Past Surgical History:  Procedure Laterality Date  . AMPUTATION Left 09/28/2018   Procedure: LEFT TRANSMETATARSAL AMPUTATION;  Surgeon: Nadara Mustard, MD;  Location: [facility] OR;  Service: Orthopedics;  Laterality: Left;  . BELOW KNEE LEG AMPUTATION Right    Family History:  Family History  Problem Relation Age of Onset  . Hypertension Other   . Diabetes Mellitus II Other    Family Psychiatric  History:  Social History:  Social History   Substance and Sexual Activity  Alcohol Use Yes   Comment: occasional     Social History   Substance and Sexual Activity  Drug Use Not Currently    Social History   Socioeconomic History  . Marital status: Single    Spouse name: Not on file  . Number of children: Not on file  . Years of education: Not on file  . Highest education level: Not on file  Occupational History  . Not on file  Social Needs  . Financial resource strain: Patient refused  . Food insecurity:    Worry: Patient refused    Inability: Patient refused  . Transportation needs:    Medical: Patient refused    Non-medical: Patient refused  Tobacco Use  . Smoking status: Current Every Day Smoker    Types: Cigarettes  . Smokeless tobacco: Never Used  Substance and Sexual Activity  . Alcohol use: Yes    Comment: occasional  . Drug use: Not Currently  . Sexual activity: Not on file  Lifestyle  . Physical activity:    Days per week: Patient refused  Minutes per session: Patient refused  . Stress: Not on file  Relationships  . Social connections:    Talks on phone: Not on file    Gets together: Not on file    Attends religious service: Not on file    Active member of club or organization: Not on file    Attends meetings of clubs or organizations: Not on file    Relationship status: Not on file  Other Topics Concern  . Not on file  Social History Narrative  . Not on file   Additional Social  History:    Allergies:  No Known Allergies  Labs:  Results for orders placed or performed during the hospital encounter of 10/04/18 (from the past 48 hour(s))  Lactic acid, plasma     Status: Abnormal   Collection Time: 10/04/18 10:31 PM  Result Value Ref Range   Lactic Acid, Venous 2.6 (HH) 0.5 - 1.9 mmol/L    Comment: CRITICAL RESULT CALLED TO, READ BACK BY AND VERIFIED WITH: SANDERS,A RN 10/04/2018 2308 JORDANS Performed at [facility] Lab, [address] [place], [place] [address]   Comprehensive metabolic panel     Status: Abnormal   Collection Time: 10/04/18 10:31 PM  Result Value Ref Range   Sodium 135 135 - 145 mmol/L   Potassium 3.4 (L) 3.5 - 5.1 mmol/L   Chloride 108 98 - 111 mmol/L   CO2 18 (L) 22 - 32 mmol/L   Glucose, Bld 220 (H) 70 - 99 mg/dL   BUN 14 6 - 20 mg/dL   Creatinine, Ser 6.04 0.61 - 1.24 mg/dL   Calcium 8.5 (L) 8.9 - 10.3 mg/dL   Total Protein 7.2 6.5 - 8.1 g/dL   Albumin 3.7 3.5 - 5.0 g/dL   AST 28 15 - 41 U/L   ALT 14 0 - 44 U/L   Alkaline Phosphatase 112 38 - 126 U/L   Total Bilirubin 5.2 (H) 0.3 - 1.2 mg/dL   GFR calc non Af Amer >60 >60 mL/min   GFR calc Af Amer >60 >60 mL/min   Anion gap 9 5 - 15    Comment: Performed at [facility] Lab, [address] [place], [place] [address]  CBC WITH DIFFERENTIAL     Status: Abnormal   Collection Time: 10/04/18 10:31 PM  Result Value Ref Range   WBC 19.7 (H) 4.0 - 10.5 K/uL   RBC 3.88 (L) 4.22 - 5.81 MIL/uL   Hemoglobin 12.4 (L) 13.0 - 17.0 g/dL   HCT 11.9 (L) 14.7 - 82.9 %   MCV 93.8 80.0 - 100.0 fL   MCH 32.0 26.0 - 34.0 pg   MCHC 34.1 30.0 - 36.0 g/dL   RDW 56.2 13.0 - 86.5 %   Platelets 182 150 - 400 K/uL   nRBC 0.0 0.0 - 0.2 %   Neutrophils Relative % 84 %   Neutro Abs 16.7 (H) 1.7 - 7.7 K/uL   Lymphocytes Relative 7 %   Lymphs Abs 1.3 0.7 - 4.0 K/uL   Monocytes Relative 7 %   Monocytes Absolute 1.5 (H) 0.1 - 1.0 K/uL   Eosinophils Relative 1 %   Eosinophils Absolute 0.1 0.0 -  0.5 K/uL   Basophils Relative 0 %   Basophils Absolute 0.0 0.0 - 0.1 K/uL   Immature Granulocytes 1 %   Abs Immature Granulocytes 0.14 (H) 0.00 - 0.07 K/uL    Comment: Performed at [facility] Lab, [address] [place], [place] [address]  Lipase, blood     Status: None   Collection Time: 10/04/18 10:31 PM  Result Value Ref Range   Lipase 21 11 - 51 U/L    Comment: Performed at [facility] Lab, [address], [place], [place] [address]  Blood Culture (routine x 2)     Status: Abnormal (Preliminary result)   Collection Time: 10/04/18 10:34 PM  Result Value Ref Range   Specimen Description BLOOD LEFT HAND    Special Requests      BOTTLES DRAWN AEROBIC AND ANAEROBIC Blood Culture adequate volume   Culture  Setup Time      GRAM POSITIVE COCCI AEROBIC BOTTLE ONLY Organism ID to follow CRITICAL RESULT CALLED TO, READ BACK BY AND VERIFIED WITH: PHRMD M PHAM  10/05/2018 BY S GEZAHEGN    Culture (A)     STAPHYLOCOCCUS AUREUS CULTURE REINCUBATED FOR BETTER GROWTH STREPTOCOCCUS GROUP C SUSCEPTIBILITIES TO FOLLOW Performed at [facility] Lab, [address] [place], [place] [address]    Report Status PENDING   Blood Culture ID Panel (Reflexed)     Status: Abnormal   Collection Time: 10/04/18 10:34 PM  Result Value Ref Range   Enterococcus species NOT DETECTED NOT DETECTED   Listeria monocytogenes NOT DETECTED NOT DETECTED   Staphylococcus species DETECTED (A) NOT DETECTED    Comment: CRITICAL RESULT CALLED TO, READ BACK BY AND VERIFIED WITH: PHRMD M PHAM  10/05/2018 BY S GEZAHEGN    Staphylococcus aureus (BCID) DETECTED (A) NOT DETECTED    Comment: Methicillin (oxacillin) susceptible Staphylococcus aureus (MSSA). Preferred therapy is anti staphylococcal beta lactam antibiotic (Cefazolin or Nafcillin), unless clinically contraindicated. CRITICAL RESULT CALLED TO, READ BACK BY AND VERIFIED WITH: PHRMD M PHAM  10/05/2018 BY S GEZAHEGN    Methicillin resistance NOT  DETECTED NOT DETECTED   Streptococcus species DETECTED (A) NOT DETECTED    Comment: Not Enterococcus species, Streptococcus agalactiae, Streptococcus pyogenes, or Streptococcus pneumoniae. CRITICAL RESULT CALLED TO, READ BACK BY AND VERIFIED WITH: PHRMD M PHAM  10/05/2018 BY S GEZAHEGN    Streptococcus agalactiae NOT DETECTED NOT DETECTED   Streptococcus pneumoniae NOT DETECTED NOT DETECTED   Streptococcus pyogenes NOT DETECTED NOT DETECTED   Acinetobacter baumannii NOT DETECTED NOT DETECTED   Enterobacteriaceae species NOT DETECTED NOT DETECTED   Enterobacter cloacae complex NOT DETECTED NOT DETECTED   Escherichia coli NOT DETECTED NOT DETECTED   Klebsiella oxytoca NOT DETECTED NOT DETECTED   Klebsiella pneumoniae NOT DETECTED NOT DETECTED   Proteus species NOT DETECTED NOT DETECTED   Serratia marcescens NOT DETECTED NOT DETECTED   Haemophilus influenzae NOT DETECTED NOT DETECTED   Neisseria meningitidis NOT DETECTED NOT DETECTED   Pseudomonas aeruginosa NOT DETECTED NOT DETECTED   Candida albicans NOT DETECTED NOT DETECTED   Candida glabrata NOT DETECTED NOT DETECTED   Candida krusei NOT DETECTED NOT DETECTED   Candida parapsilosis NOT DETECTED NOT DETECTED   Candida tropicalis NOT DETECTED NOT DETECTED    Comment: Performed at [facility] Lab, [address], [place], [place] [address]  Blood Culture (routine x 2)     Status: None (Preliminary result)   Collection Time: 10/04/18 10:35 PM  Result Value Ref Range   Specimen Description BLOOD LEFT ANTECUBITAL    Special Requests      BOTTLES DRAWN AEROBIC ONLY Blood Culture results may not be optimal due to an inadequate volume of blood received in culture bottles   Culture      NO GROWTH 2 DAYS Performed at  [facility] Lab, [address] [place], [place], [place] [address]    Report Status PENDING   Influenza panel by PCR (type A & B)     Status: None   Collection Time: 10/04/18 11:15 PM  Result Value Ref Range   Influenza  A By PCR NEGATIVE NEGATIVE   Influenza B By PCR NEGATIVE NEGATIVE    Comment: (NOTE) The Xpert Xpress Flu assay is intended as an aid in the diagnosis of  influenza and should not be used as a sole basis for treatment.  This  assay is FDA approved for nasopharyngeal swab specimens only. Nasal  washings and aspirates are unacceptable for Xpert Xpress Flu testing. Performed at [facility] Lab, [address], [place], [place] [address]   I-Stat Troponin, ED (not at [facility])     Status: None   Collection Time: 10/04/18 11:45 PM  Result Value Ref Range   Troponin i, poc 0.00 0.00 - 0.08 ng/mL   Comment 3            Comment: Due to the release kinetics of cTnI, a negative result within the first hours of the onset of symptoms does not rule out myocardial infarction with certainty. If myocardial infarction is still suspected, repeat the test at appropriate intervals.   Lactic acid, plasma     Status: None   Collection Time: 10/05/18 12:48 AM  Result Value Ref Range   Lactic Acid, Venous 1.8 0.5 - 1.9 mmol/L    Comment: Performed at [facility] Lab, [address], [place], [place] [address]  CBC with Differential     Status: Abnormal   Collection Time: 10/05/18  6:18 AM  Result Value Ref Range   WBC 17.1 (H) 4.0 - 10.5 K/uL   RBC 3.29 (L) 4.22 - 5.81 MIL/uL   Hemoglobin 10.4 (L) 13.0 - 17.0 g/dL   HCT 01.7 (L) 51.0 - 25.8 %   MCV 93.9 80.0 - 100.0 fL   MCH 31.6 26.0 - 34.0 pg   MCHC 33.7 30.0 - 36.0 g/dL   RDW 52.7 78.2 - 42.3 %   Platelets 138 (L) 150 - 400 K/uL   nRBC 0.0 0.0 - 0.2 %   Neutrophils Relative % 73 %   Neutro Abs 12.5 (H) 1.7 - 7.7 K/uL   Lymphocytes Relative 14 %   Lymphs Abs 2.4 0.7 - 4.0 K/uL   Monocytes Relative 11 %   Monocytes Absolute 1.9 (H) 0.1 - 1.0 K/uL   Eosinophils Relative 1 %   Eosinophils Absolute 0.2 0.0 - 0.5 K/uL   Basophils Relative 0 %   Basophils Absolute 0.0 0.0 - 0.1 K/uL   Immature Granulocytes 1 %   Abs Immature Granulocytes 0.12  (H) 0.00 - 0.07 K/uL    Comment: Performed at [facility], [address], [place], [place] [address]  Comprehensive metabolic panel     Status: Abnormal   Collection Time: 10/05/18  6:18 AM  Result Value Ref Range   Sodium 136 135 - 145 mmol/L   Potassium 2.9 (L) 3.5 - 5.1 mmol/L   Chloride 106 98 - 111 mmol/L   CO2 19 (L) 22 - 32 mmol/L   Glucose, Bld 169 (H) 70 - 99 mg/dL   BUN 14 6 - 20 mg/dL   Creatinine, Ser 4.31 0.61 - 1.24 mg/dL   Calcium 8.2 (L) 8.9 - 10.3 mg/dL   Total Protein 6.2 (L) 6.5 - 8.1 g/dL   Albumin 3.1 (  L) 3.5 - 5.0 g/dL   AST 20 15 - 41 U/L   ALT 13 0 - 44 U/L   Alkaline Phosphatase 90 38 - 126 U/L   Total Bilirubin 3.0 (H) 0.3 - 1.2 mg/dL   GFR calc non Af Amer >60 >60 mL/min   GFR calc Af Amer >60 >60 mL/min   Anion gap 11 5 - 15    Comment: Performed at [facility], [address] [place] [address], [place], [place] [address]  Lactic acid, plasma     Status: None   Collection Time: 10/05/18  6:18 AM  Result Value Ref Range   Lactic Acid, Venous 1.4 0.5 - 1.9 mmol/L    Comment: Performed at [facility] Lab, [address], [place], [place] [address]  Bilirubin, direct     Status: Abnormal   Collection Time: 10/05/18  6:18 AM  Result Value Ref Range   Bilirubin, Direct 0.9 (H) 0.0 - 0.2 mg/dL    Comment: Performed at [facility], [address] [place], [place] [address]  Troponin I -     Status: None   Collection Time: 10/05/18  6:18 AM  Result Value Ref Range   Troponin I <0.03 <0.03 ng/mL    Comment: Performed at [facility] Lab, [address], [place], [place] [address]  CBG monitoring, ED     Status: Abnormal   Collection Time: 10/05/18  7:17 AM  Result Value Ref Range   Glucose-Capillary 147 (H) 70 - 99 mg/dL  Urinalysis, Routine w reflex microscopic     Status: Abnormal   Collection Time: 10/05/18 11:53 AM  Result Value Ref Range   Color, Urine AMBER (A) YELLOW    Comment: BIOCHEMICALS MAY BE AFFECTED BY COLOR   APPearance CLEAR  CLEAR   Specific Gravity, Urine 1.034 (H) 1.005 - 1.030   pH 5.0 5.0 - 8.0   Glucose, UA NEGATIVE NEGATIVE mg/dL   Hgb urine dipstick NEGATIVE NEGATIVE   Bilirubin Urine SMALL (A) NEGATIVE   Ketones, ur 5 (A) NEGATIVE mg/dL   Protein, ur 30 (A) NEGATIVE mg/dL   Nitrite NEGATIVE NEGATIVE   Leukocytes,Ua NEGATIVE NEGATIVE   RBC / HPF 0-5 0 - 5 RBC/hpf   WBC, UA 0-5 0 - 5 WBC/hpf   Bacteria, UA RARE (A) NONE SEEN   Squamous Epithelial / LPF 0-5 0 - 5   Mucus PRESENT     Comment: Performed at [facility] Lab, [address] [place], [place] [address]  CBG monitoring, ED     Status: Abnormal   Collection Time: 10/05/18 11:54 AM  Result Value Ref Range   Glucose-Capillary 139 (H) 70 - 99 mg/dL  Troponin I - Now Then Q6H     Status: None   Collection Time: 10/05/18 12:07 PM  Result Value Ref Range   Troponin I <0.03 <0.03 ng/mL    Comment: Performed at [facility] Lab, [address], [place], [place] [address]  Glucose, capillary     Status: Abnormal   Collection Time: 10/05/18  5:23 PM  Result Value Ref Range   Glucose-Capillary 143 (H) 70 - 99 mg/dL  Urine rapid drug screen (hosp performed)     Status: Abnormal   Collection Time: 10/05/18  6:44 PM  Result Value Ref Range   Opiates NONE DETECTED NONE DETECTED   Cocaine POSITIVE (A) NONE DETECTED   Benzodiazepines NONE DETECTED NONE DETECTED   Amphetamines NONE DETECTED NONE DETECTED   Tetrahydrocannabinol  NONE DETECTED NONE DETECTED   Barbiturates NONE DETECTED NONE DETECTED    Comment: (NOTE) DRUG SCREEN FOR MEDICAL PURPOSES ONLY.  IF CONFIRMATION IS NEEDED FOR ANY PURPOSE, NOTIFY LAB WITHIN 5 DAYS. LOWEST DETECTABLE LIMITS FOR URINE DRUG SCREEN Drug Class                     Cutoff (ng/mL) Amphetamine and metabolites    1000 Barbiturate and metabolites    200 Benzodiazepine                 200 Tricyclics and metabolites     300 Opiates and metabolites        300 Cocaine and metabolites        300 THC                             50 Performed at [facility] Lab, [address], [place], [place] [address]   CBC with Differential     Status: Abnormal   Collection Time: 10/06/18  2:37 AM  Result Value Ref Range   WBC 5.6 4.0 - 10.5 K/uL   RBC 2.65 (L) 4.22 - 5.81 MIL/uL   Hemoglobin 8.6 (L) 13.0 - 17.0 g/dL   HCT 84.1 (L) 32.4 - 40.1 %   MCV 94.0 80.0 - 100.0 fL   MCH 32.5 26.0 - 34.0 pg   MCHC 34.5 30.0 - 36.0 g/dL   RDW 02.7 25.3 - 66.4 %   Platelets 87 (L) 150 - 400 K/uL    Comment: REPEATED TO VERIFY PLATELET COUNT CONFIRMED BY SMEAR SPECIMEN CHECKED FOR CLOTS Immature Platelet Fraction may be clinically indicated, consider ordering this additional test [id number]    nRBC 0.0 0.0 - 0.2 %   Neutrophils Relative % 64 %   Neutro Abs 3.7 1.7 - 7.7 K/uL   Lymphocytes Relative 20 %   Lymphs Abs 1.1 0.7 - 4.0 K/uL   Monocytes Relative 9 %   Monocytes Absolute 0.5 0.1 - 1.0 K/uL   Eosinophils Relative 5 %   Eosinophils Absolute 0.3 0.0 - 0.5 K/uL   Basophils Relative 1 %   Basophils Absolute 0.0 0.0 - 0.1 K/uL   Immature Granulocytes 1 %   Abs Immature Granulocytes 0.04 0.00 - 0.07 K/uL    Comment: Performed at [facility] Lab, [address] [place], [place] [address]  Culture, blood (routine x 2)     Status: None (Preliminary result)   Collection Time: 10/06/18  2:37 AM  Result Value Ref Range   Specimen Description BLOOD RIGHT ANTECUBITAL    Special Requests      BOTTLES DRAWN AEROBIC ONLY Blood Culture results may not be optimal due to an inadequate volume of blood received in culture bottles   Culture      NO GROWTH < 12 HOURS Performed at [facility], [address], [place], [place] [address]    Report Status PENDING   Culture, blood (routine x 2)     Status: None (Preliminary result)   Collection Time: 10/06/18  2:37 AM  Result Value Ref Range   Specimen Description BLOOD RIGHT ANTECUBITAL    Special Requests      BOTTLES DRAWN AEROBIC ONLY Blood Culture  results may not be optimal due to an inadequate volume of blood received in culture bottles   Culture      NO GROWTH < 12 HOURS Performed at [facility], [address] [place], [place] [address]    Report Status PENDING   Basic metabolic panel     Status: Abnormal   Collection Time: 10/06/18  2:37 AM  Result Value Ref Range   Sodium 136 135 - 145 mmol/L   Potassium 3.4 (L) 3.5 - 5.1 mmol/L   Chloride 109 98 - 111 mmol/L   CO2 19 (L) 22 - 32 mmol/L   Glucose, Bld 125 (H) 70 - 99 mg/dL   BUN 11 6 - 20 mg/dL   Creatinine, Ser 9.53 0.61 - 1.24 mg/dL   Calcium 7.8 (L) 8.9 - 10.3 mg/dL   GFR calc non Af Amer >60 >60 mL/min   GFR calc Af Amer >60 >60 mL/min   Anion gap 8 5 - 15    Comment: Performed at [facility] Lab, [address], [place], [place] [address]  Magnesium     Status: Abnormal   Collection Time: 10/06/18  2:37 AM  Result Value Ref Range   Magnesium 1.6 (L) 1.7 - 2.4 mg/dL    Comment: Performed at [facility] Lab, [address], [place], [place] [address]  Glucose, capillary     Status: Abnormal   Collection Time: 10/06/18  7:24 AM  Result Value Ref Range   Glucose-Capillary 109 (H) 70 - 99 mg/dL  Glucose, capillary     Status: Abnormal   Collection Time: 10/06/18 12:32 PM  Result Value Ref Range   Glucose-Capillary 134 (H) 70 - 99 mg/dL    Current Facility-Administered Medications  Medication Dose Route Frequency Provider Last Rate Last Dose  . 0.9 %  sodium chloride infusion   Intravenous Continuous Bobette Mo, MD 125 mL/hr at 10/06/18 [phone]    . acetaminophen (TYLENOL) tablet 650 mg  650 mg Oral Q4H PRN Bobette Mo, MD   650 mg at 10/05/18 0734  . ceFAZolin (ANCEF) IVPB 2g/100 mL premix  2 g Intravenous Janit Bern, MD 200 mL/hr at 10/06/18 1420 2 g at 10/06/18 1420  . enoxaparin (LOVENOX) injection 60 mg  60 mg Subcutaneous Q24H Bobette Mo, MD   60 mg at 10/06/18 1416  . insulin aspart (novoLOG) injection 0-20 Units  0-20  Units Subcutaneous TID WC Bobette Mo, MD   3 Units at 10/06/18 1416  . nicotine (NICODERM CQ - dosed in mg/24 hours) patch 21 mg  21 mg Transdermal Daily PRN Bobette Mo, MD      . ondansetron [facility]) injection 4 mg  4 mg Intravenous Q6H PRN Bobette Mo, MD      . polyethylene glycol [facility]) packet 17 g  17 g Oral Daily Albertine Grates, MD   17 g at 10/06/18 1237  . QUEtiapine (SEROQUEL) tablet 400 mg  400 mg Oral QHS Bobette Mo, MD   400 mg at 10/05/18 2239  . senna-docusate (Senokot-S) tablet 1 tablet  1 tablet Oral BID Albertine Grates, MD   1 tablet at 10/06/18 [phone]  . tamsulosin (FLOMAX) capsule 0.4 mg  0.4 mg Oral QPC supper Albertine Grates, MD      . traZODone (DESYREL) tablet 100 mg  100 mg Oral QHS Bobette Mo, MD   100 mg at 10/05/18 2238    Musculoskeletal: Strength & Muscle Tone: decreased Gait & Station: unable to stand Patient leans: N/A  Psychiatric Specialty Exam: Physical Exam  Psychiatric: He has a normal mood and affect. His speech is normal and behavior  is normal. Judgment and thought content normal. Cognition and memory are normal.    Review of Systems  Constitutional: Negative.   HENT: Negative.   Eyes: Negative.   Respiratory: Negative.   Cardiovascular: Negative.   Gastrointestinal: Negative.   Genitourinary: Negative.   Musculoskeletal:       Recent amputation  Skin: Negative.   Neurological: Negative.   Psychiatric/Behavioral: Negative for depression, substance abuse and suicidal ideas. The patient is not nervous/anxious.     Blood pressure 114/84, pulse 76, temperature 98.7 F (37.1 C), temperature source Axillary, resp. rate (!) 22, height  (1.778 m), weight 117.1 kg, SpO2 98 %.Body mass index is 37.04 kg/m.  General Appearance: Fairly Groomed  Eye Contact:  Good  Speech:  Normal Rate  Volume:  Normal  Mood:  Euthymic  Affect:  Appropriate and Congruent  Thought Process:  Coherent and Linear  Orientation:   Full (Time, Place, and Person)  Thought Content:  Logical  Suicidal Thoughts:  No  Homicidal Thoughts:  No  Memory:  Immediate;   Good Recent;   Good Remote;   Good  Judgement:  Intact  Insight:  Good  Psychomotor Activity:  Normal  Concentration:  Concentration: Good and Attention Span: Good  Recall:  Good  Fund of Knowledge:  Good  Language:  Good  Akathisia:  No  Handed:  Right  AIMS (if indicated):     Assets:  Desire for Improvement  ADL's:  Intact  Cognition:  WNL  Sleep:   fair     Treatment Plan Summary:  58 y.o. male who was admitted to the hospital due to chest pain and sepsis. Patient reports being frustrated with multiple stressors that include his living condition, financial stressors and poor medical health. He reports that at no time did he mention being suicidal or homicidal. Patient says:''I will never kill myself, I don't even know how.'' However, patient is requesting to talk to a social worker to discuss his living condition.   Recommendations: - Consider social work consult to address patient's living conditions.  -Continue with Seroquel 400 mg by mouth at bedtime for mood.  -Continue Trazodone 100 mg by mouth at bedtime for sleep.     Disposition:  Patient does not meet criteria for psychiatric inpatient admission, he is clearly not a danger to self and others. Supportive therapy provided about ongoing stressors. Psychiatric service signing off. Re-consult as needed.   Thedore Mins, MD 10/06/2018 2:45 PM

## 2018-10-06 NOTE — Progress Notes (Signed)
Was on unit to respond to consult request and rcvd emergent pg to ED.  Briefly spoke to pt and sitter about that and therefore his chaplain visit would be delayed.  Margretta Sidle resident, [phone]

## 2018-10-06 NOTE — Progress Notes (Signed)
Pt bladder scan was . Patient urinated 800 ml around 0900 on his own. Will continue to monitor.

## 2018-10-07 DIAGNOSIS — I480 Paroxysmal atrial fibrillation: Secondary | ICD-10-CM

## 2018-10-07 DIAGNOSIS — I4891 Unspecified atrial fibrillation: Secondary | ICD-10-CM

## 2018-10-07 LAB — BASIC METABOLIC PANEL
Anion gap: 7 (ref 5–15)
BUN: 7 mg/dL (ref 6–20)
CO2: 18 mmol/L — ABNORMAL LOW (ref 22–32)
Calcium: 7.8 mg/dL — ABNORMAL LOW (ref 8.9–10.3)
Chloride: 110 mmol/L (ref 98–111)
Creatinine, Ser: 0.78 mg/dL (ref 0.61–1.24)
GFR calc Af Amer: >60 mL/min (ref >60)
GFR calc non Af Amer: >60 mL/min (ref >60)
Glucose, Bld: 216 mg/dL — ABNORMAL HIGH (ref 70–99)
Potassium: 3.8 mmol/L (ref 3.5–5.1)
Sodium: 135 mmol/L (ref 135–145)

## 2018-10-07 LAB — MAGNESIUM: MAGNESIUM: 1.5 mg/dL — AB (ref 1.7–2.4)

## 2018-10-07 LAB — GLUCOSE, CAPILLARY
Glucose-Capillary: 192 mg/dL — ABNORMAL HIGH (ref 70–99)
Glucose-Capillary: 125 mg/dL — ABNORMAL HIGH (ref 70–99)
Glucose-Capillary: 143 mg/dL — ABNORMAL HIGH (ref 70–99)
Glucose-Capillary: 145 mg/dL — ABNORMAL HIGH (ref 70–99)

## 2018-10-07 LAB — CBC WITH DIFFERENTIAL/PLATELET
Abs Immature Granulocytes: 0.04 10*3/uL (ref 0.00–0.07)
Basophils Absolute: 0 10*3/uL (ref 0.0–0.1)
Basophils Relative: 1 %
Eosinophils Absolute: 0.2 10*3/uL (ref 0.0–0.5)
Eosinophils Relative: 5 %
HCT: 25.2 % — ABNORMAL LOW (ref 39.0–52.0)
Hemoglobin: 8.6 g/dL — ABNORMAL LOW (ref 13.0–17.0)
Immature Granulocytes: 1 %
Lymphocytes Relative: 25 %
Lymphs Abs: 1 10*3/uL (ref 0.7–4.0)
MCH: 32.2 pg (ref 26.0–34.0)
MCHC: 34.1 g/dL (ref 30.0–36.0)
MCV: 94.4 fL (ref 80.0–100.0)
MONO ABS: 0.4 10*3/uL (ref 0.1–1.0)
Monocytes Relative: 10 %
Neutro Abs: 2.4 10*3/uL (ref 1.7–7.7)
Neutrophils Relative %: 58 %
Platelets: 90 10*3/uL — ABNORMAL LOW (ref 150–400)
RBC: 2.67 MIL/uL — ABNORMAL LOW (ref 4.22–5.81)
RDW: 13.4 % (ref 11.5–15.5)
WBC: 4.1 10*3/uL (ref 4.0–10.5)
nRBC: 0 % (ref 0.0–0.2)

## 2018-10-07 LAB — VITAMIN B12: Vitamin B-12: 261 pg/mL (ref 180–914)

## 2018-10-07 LAB — FOLATE: Folate: 20 ng/mL (ref >5.9)

## 2018-10-07 MED ORDER — POLYETHYLENE GLYCOL 3350 17 G PO PACK
17.0000 g | PACK | Freq: Two times a day (BID) | ORAL | Status: DC
  Administered 2018-10-07 – 2018-10-16 (×15): 17 g via ORAL
  Filled 2018-10-07 (×19): qty 1

## 2018-10-07 MED ORDER — VITAMIN B-12 1000 MCG PO TABS
1000.0000 ug | ORAL_TABLET | Freq: Every day | ORAL | Status: DC
  Administered 2018-10-08 – 2018-10-18 (×11): 1000 ug via ORAL
  Filled 2018-10-07 (×11): qty 1

## 2018-10-07 MED ORDER — SODIUM CHLORIDE 0.9 % IV SOLN: INTRAVENOUS | Status: AC

## 2018-10-07 MED ORDER — MAGNESIUM SULFATE 4 GM/100ML IV SOLN
4.0000 g | Freq: Once | INTRAVENOUS | Status: AC
  Administered 2018-10-07: 4 g via INTRAVENOUS
  Filled 2018-10-07: qty 100

## 2018-10-07 MED ORDER — MAGNESIUM OXIDE 400 (241.3 MG) MG PO TABS
400.0000 mg | ORAL_TABLET | Freq: Every day | ORAL | Status: AC
  Administered 2018-10-08 – 2018-10-09 (×2): 400 mg via ORAL
  Filled 2018-10-07 (×2): qty 1

## 2018-10-07 NOTE — Plan of Care (Signed)
  Problem: Clinical Measurements: Goal: Ability to maintain clinical measurements within normal limits will improve Outcome: Progressing   Problem: Clinical Measurements: Goal: Cardiovascular complication will be avoided Outcome: Progressing   
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
## 2018-10-07 NOTE — Progress Notes (Signed)
Returned belongings (cane/ clothes in bag to patient). Pt states he had a prosthesis that was taken by security also. That was not in area where belongings were kept. Charge nurse notified. Will continue to try and find out where prosthesis is.

## 2018-10-07 NOTE — Progress Notes (Signed)
PROGRESS NOTE  Cory Palmer JQD:[id number] DOB: 1961-04-11 DOA: 10/04/2018 PCP: Patient, [name] Per  HPI/Recap of past 24 hours:  He denies suicidal today, sitter discontinued after psych clearance He denies chest pain, no sob Left foot wrapped, no odor, no overt bleeding no fever,    Assessment/Plan: Principal Problem:   Sepsis without acute organ dysfunction (HCC) Active Problems:   Homelessness   DM (diabetes mellitus), type 2 with complications (HCC)   S/P transmetatarsal amputation of foot, left (HCC)   Depressed bipolar disorder (HCC)   Hypertension   Hyperbilirubinemia   Bacteremia due to Gram-positive bacteria   Hypokalemia   Hypomagnesemia    Sepsis, mssa and strep bacteremia, likely source from foot -TTE done on 3/6 no vegetation  -Repeat blood culture obtained on 3/7 , no growth so far -on ancef, ID following  S/p transmetatarsal amputation of foot, left Wound care F/u with Dr [name]  Afib with intermittent short pauses, does not appear to have symptom (likely new diagnosis) -continue tele -tsh unremarkable, keep k>4, mag>2 -he has anemia and thrombocytopenia, surgical wound bleeding (left foot) prior to coming to the hospital, not a good candidate for anticoagulation -Echo with adequate ef, bp stable -monitor, needs cardiology consult  Normocytic anemia,Chronic thrombocytopenia  b12 low normal ,will start b12 supplement Monitor  Hypokalemia/hypomagnesemia Remain low, continue to Replace k/mag, recheck in am  noninsulin dependent diabetes with hyperglycemia A1c6.6 Home meds metformin held On ssi here  Bipolar /depression/ SI seroquel /trazodone  He denies SI, Psychiatry consulted, he is deemed to be safe, sitter discontinued per psychiatry recommendation  Homeless /Polysubstance abuse (cocaine) HIV/hepatitis negative Social worker consult  he reports was at [place] detox facility for 8-9 months prior to relocating to [place] 56months ago,  he needs to have pcp, he needs housing. He reports lives in a motel with a drug dealer prior to this hospitalization    Code Status: full  Family Communication: patient   Disposition Plan: not ready to discharge   Consultants:  Psych  ID  Procedures:  none  Antibiotics:  Vanc/flagyl/cefepime x1 on admission  Ancef from 3/6   Objective: BP 102/63 (BP Location: Left Arm)   Pulse 64   Temp 98.2 F (36.8 C) (Oral)   Resp (!) 23   Ht 5\' 10"  (1.778 m)   Wt 109 kg   SpO2 96%   BMI 34.48 kg/m   Intake/Output Summary (Last 24 hours) at 10/07/2018 1720 Last data filed at 10/07/2018 1340 Gross per 24 hour  Intake 500 ml  Output 3650 ml  Net -3150 ml   Filed Weights   10/04/18 2228 10/05/18 1515 10/07/18 0332  Weight: 117.9 kg 117.1 kg 109 kg    Exam: Patient is examined daily including today on 10/07/2018, exams remain the same as of yesterday except that has changed    General:  NAD  Cardiovascular: RRR  Respiratory: CTABL  Abdomen: Soft/ND/NT, positive BS  Musculoskeletal: No Edema, left foot amputation   Neuro: alert, oriented   Data Reviewed: Basic Metabolic Panel: Recent Labs  Lab 10/04/18 2231 10/05/18 0618 10/06/18 0237 10/07/18 0830  NA 135 136 136 135  K 3.4* 2.9* 3.4* 3.8  CL 108 106 109 110  CO2 18* 19* 19* 18*  GLUCOSE 220* 169* 125* 216*  BUN 14 14 11 7   CREATININE 0.96 0.95 0.73 0.78  CALCIUM 8.5* 8.2* 7.8* 7.8*  MG  --   --  1.6* 1.5*   Liver Function Tests: Recent Labs  Lab 10/04/18 2231 10/05/18 0618  AST 28 20  ALT 14 13  ALKPHOS 112 90  BILITOT 5.2* 3.0*  PROT 7.2 6.2*  ALBUMIN 3.7 3.1*   Recent Labs  Lab 10/04/18 2231  LIPASE 21   No results for input(s): AMMONIA in the last 168 hours. CBC: Recent Labs  Lab 10/04/18 2231 10/05/18 0618 10/06/18 0237 10/07/18 0830  WBC 19.7* 17.1* 5.6 4.1  NEUTROABS 16.7* 12.5* 3.7 2.4  HGB 12.4* 10.4* 8.6* 8.6*  HCT 36.4* 30.9* 24.9* 25.2*  MCV 93.8 93.9 94.0 94.4    PLT 182 138* 87* 90*   Cardiac Enzymes:   Recent Labs  Lab 10/05/18 0618 10/05/18 1207  TROPONINI <0.03 <0.03   BNP (last 3 results) No results for input(s): BNP in the last 8760 hours.  ProBNP (last 3 results) No results for input(s): PROBNP in the last 8760 hours.  CBG: Recent Labs  Lab 10/06/18 1624 10/06/18 2133 10/07/18 0803 10/07/18 1240 10/07/18 1700  GLUCAP 119* 114* 192* 125* 143*    Recent Results (from the past 240 hour(s))  Blood Culture (routine x 2)     Status: Abnormal (Preliminary result)   Collection Time: 10/04/18 10:34 PM  Result Value Ref Range Status   Specimen Description BLOOD LEFT HAND  Final   Special Requests   Final    BOTTLES DRAWN AEROBIC AND ANAEROBIC Blood Culture adequate volume   Culture  Setup Time   Final    GRAM POSITIVE COCCI AEROBIC BOTTLE ONLY Organism ID to follow CRITICAL RESULT CALLED TO, READ BACK BY AND VERIFIED WITH: PHRMD M PHAM @1356  10/05/2018 BY S GEZAHEGN    Culture (A)  Final    STAPHYLOCOCCUS AUREUS SUSCEPTIBILITIES TO FOLLOW STREPTOCOCCUS GROUP C SUSCEPTIBILITIES TO FOLLOW Performed at [facility], [address], [place], [place] [address]    Report Status PENDING  Incomplete  Blood Culture ID Panel (Reflexed)     Status: Abnormal   Collection Time: 10/04/18 10:34 PM  Result Value Ref Range Status   Enterococcus species NOT DETECTED NOT DETECTED Final   Listeria monocytogenes NOT DETECTED NOT DETECTED Final   Staphylococcus species DETECTED (A) NOT DETECTED Final    Comment: CRITICAL RESULT CALLED TO, READ BACK BY AND VERIFIED WITH: PHRMD M PHAM @1356  10/05/2018 BY S GEZAHEGN    Staphylococcus aureus (BCID) DETECTED (A) NOT DETECTED Final    Comment: Methicillin (oxacillin) susceptible Staphylococcus aureus (MSSA). Preferred therapy is anti staphylococcal beta lactam antibiotic (Cefazolin or Nafcillin), unless clinically contraindicated. CRITICAL RESULT CALLED TO, READ BACK BY AND VERIFIED WITH: PHRMD  M PHAM @1356  10/05/2018 BY S GEZAHEGN    Methicillin resistance NOT DETECTED NOT DETECTED Final   Streptococcus species DETECTED (A) NOT DETECTED Final    Comment: Not Enterococcus species, Streptococcus agalactiae, Streptococcus pyogenes, or Streptococcus pneumoniae. CRITICAL RESULT CALLED TO, READ BACK BY AND VERIFIED WITH: PHRMD M PHAM @1356  10/05/2018 BY S GEZAHEGN    Streptococcus agalactiae NOT DETECTED NOT DETECTED Final   Streptococcus pneumoniae NOT DETECTED NOT DETECTED Final   Streptococcus pyogenes NOT DETECTED NOT DETECTED Final   Acinetobacter baumannii NOT DETECTED NOT DETECTED Final   Enterobacteriaceae species NOT DETECTED NOT DETECTED Final   Enterobacter cloacae complex NOT DETECTED NOT DETECTED Final   Escherichia coli NOT DETECTED NOT DETECTED Final   Klebsiella oxytoca NOT DETECTED NOT DETECTED Final   Klebsiella pneumoniae NOT DETECTED NOT DETECTED Final   Proteus species NOT DETECTED NOT DETECTED Final   Serratia marcescens NOT DETECTED NOT  DETECTED Final   Haemophilus influenzae NOT DETECTED NOT DETECTED Final   Neisseria meningitidis NOT DETECTED NOT DETECTED Final   Pseudomonas aeruginosa NOT DETECTED NOT DETECTED Final   Candida albicans NOT DETECTED NOT DETECTED Final   Candida glabrata NOT DETECTED NOT DETECTED Final   Candida krusei NOT DETECTED NOT DETECTED Final   Candida parapsilosis NOT DETECTED NOT DETECTED Final   Candida tropicalis NOT DETECTED NOT DETECTED Final    Comment: Performed at [facility], [address], [place], [place] [address]  Blood Culture (routine x 2)     Status: None (Preliminary result)   Collection Time: 10/04/18 10:35 PM  Result Value Ref Range Status   Specimen Description BLOOD LEFT ANTECUBITAL  Final   Special Requests   Final    BOTTLES DRAWN AEROBIC ONLY Blood Culture results may not be optimal due to an inadequate volume of blood received in culture bottles   Culture   Final    NO GROWTH 3 DAYS Performed at  [facility], [address], [place], [place] [address]    Report Status PENDING  Incomplete  Culture, blood (routine x 2)     Status: None (Preliminary result)   Collection Time: 10/06/18  2:37 AM  Result Value Ref Range Status   Specimen Description BLOOD RIGHT ANTECUBITAL  Final   Special Requests   Final    BOTTLES DRAWN AEROBIC ONLY Blood Culture results may not be optimal due to an inadequate volume of blood received in culture bottles   Culture   Final    NO GROWTH 1 DAY Performed at [facility], [address] [place], [place] [address]    Report Status PENDING  Incomplete  Culture, blood (routine x 2)     Status: None (Preliminary result)   Collection Time: 10/06/18  2:37 AM  Result Value Ref Range Status   Specimen Description BLOOD RIGHT ANTECUBITAL  Final   Special Requests   Final    BOTTLES DRAWN AEROBIC ONLY Blood Culture results may not be optimal due to an inadequate volume of blood received in culture bottles   Culture   Final    NO GROWTH 1 DAY Performed at [facility], [address], [place], [place] [address]    Report Status PENDING  Incomplete     Studies: No results found.  Scheduled Meds: . enoxaparin (LOVENOX) injection  60 mg Subcutaneous Q24H  . insulin aspart  0-20 Units Subcutaneous TID WC  . polyethylene glycol  17 g Oral Daily  . QUEtiapine  400 mg Oral QHS  . senna-docusate  1 tablet Oral BID  . tamsulosin  0.4 mg Oral QPC supper  . traZODone  100 mg Oral QHS    Continuous Infusions: . sodium chloride 125 mL/hr at 10/07/18 1433  .  ceFAZolin (ANCEF) IV 2 g (10/07/18 1439)     Time spent: I have personally reviewed and interpreted on  10/07/2018 daily labs, tele strips, imagings as discussed above under date review session and assessment and plans.  I reviewed all nursing notes, pharmacy notes, consultant notes,  vitals, pertinent old records  I have discussed plan of care as described above with RN ,  patient on 10/07/2018   Albertine Grates MD, PhD  [facility] Pager [phone]. If 7PM-7AM, please contact night-coverage at [email or web address], password [facility] 10/07/2018, 5:20 PM  LOS: 2 days

## 2018-10-07 NOTE — Progress Notes (Signed)
Prosthesis found. At pt bedside.

## 2018-10-07 NOTE — Progress Notes (Addendum)
Patient 1:1 monitoring discontinued as per psych consult note stating pt is not a danger to self and others.

## 2018-10-08 ENCOUNTER — Ambulatory Visit (INDEPENDENT_AMBULATORY_CARE_PROVIDER_SITE_OTHER): Payer: Medicare Other | Admitting: Orthopedic Surgery

## 2018-10-08 DIAGNOSIS — T8149XA Infection following a procedure, other surgical site, initial encounter: Secondary | ICD-10-CM

## 2018-10-08 DIAGNOSIS — B954 Other streptococcus as the cause of diseases classified elsewhere: Secondary | ICD-10-CM

## 2018-10-08 DIAGNOSIS — I480 Paroxysmal atrial fibrillation: Secondary | ICD-10-CM

## 2018-10-08 LAB — CBC WITH DIFFERENTIAL/PLATELET
Abs Immature Granulocytes: 0.02 10*3/uL (ref 0.00–0.07)
Basophils Absolute: 0 10*3/uL (ref 0.0–0.1)
Basophils Relative: 1 %
Eosinophils Absolute: 0.2 10*3/uL (ref 0.0–0.5)
Eosinophils Relative: 4 %
HCT: 26.7 % — ABNORMAL LOW (ref 39.0–52.0)
Hemoglobin: 8.9 g/dL — ABNORMAL LOW (ref 13.0–17.0)
Immature Granulocytes: 1 %
Lymphocytes Relative: 31 %
Lymphs Abs: 1.1 10*3/uL (ref 0.7–4.0)
MCH: 31.2 pg (ref 26.0–34.0)
MCHC: 33.3 g/dL (ref 30.0–36.0)
MCV: 93.7 fL (ref 80.0–100.0)
Monocytes Absolute: 0.4 10*3/uL (ref 0.1–1.0)
Monocytes Relative: 10 %
Neutro Abs: 2 10*3/uL (ref 1.7–7.7)
Neutrophils Relative %: 53 %
Platelets: 99 10*3/uL — ABNORMAL LOW (ref 150–400)
RBC: 2.85 MIL/uL — ABNORMAL LOW (ref 4.22–5.81)
RDW: 13.4 % (ref 11.5–15.5)
WBC: 3.7 10*3/uL — ABNORMAL LOW (ref 4.0–10.5)
nRBC: 0 % (ref 0.0–0.2)

## 2018-10-08 LAB — HEPATIC FUNCTION PANEL
ALT: 10 U/L (ref 0–44)
AST: 19 U/L (ref 15–41)
Albumin: 2.4 g/dL — ABNORMAL LOW (ref 3.5–5.0)
Alkaline Phosphatase: 89 U/L (ref 38–126)
Bilirubin, Direct: 0.3 mg/dL — ABNORMAL HIGH (ref 0.0–0.2)
Indirect Bilirubin: 0.5 mg/dL (ref 0.3–0.9)
Total Bilirubin: 0.8 mg/dL (ref 0.3–1.2)
Total Protein: 5.2 g/dL — ABNORMAL LOW (ref 6.5–8.1)

## 2018-10-08 LAB — GLUCOSE, CAPILLARY
GLUCOSE-CAPILLARY: 154 mg/dL — AB (ref 70–99)
Glucose-Capillary: 175 mg/dL — ABNORMAL HIGH (ref 70–99)
Glucose-Capillary: 115 mg/dL — ABNORMAL HIGH (ref 70–99)
Glucose-Capillary: 108 mg/dL — ABNORMAL HIGH (ref 70–99)

## 2018-10-08 LAB — BASIC METABOLIC PANEL WITH GFR
Anion gap: 7 (ref 5–15)
BUN: 6 mg/dL (ref 6–20)
CO2: 21 mmol/L — ABNORMAL LOW (ref 22–32)
Calcium: 8 mg/dL — ABNORMAL LOW (ref 8.9–10.3)
Chloride: 111 mmol/L (ref 98–111)
Creatinine, Ser: 0.78 mg/dL (ref 0.61–1.24)
GFR calc Af Amer: >60 mL/min
GFR calc non Af Amer: >60 mL/min
Glucose, Bld: 163 mg/dL — ABNORMAL HIGH (ref 70–99)
Potassium: 3.5 mmol/L (ref 3.5–5.1)
Sodium: 139 mmol/L (ref 135–145)

## 2018-10-08 LAB — MAGNESIUM: Magnesium: 1.8 mg/dL (ref 1.7–2.4)

## 2018-10-08 LAB — TSH: TSH: 2.861 u[IU]/mL (ref 0.350–4.500)

## 2018-10-08 MED ORDER — NICOTINE 14 MG/24HR TD PT24
14.0000 mg | MEDICATED_PATCH | Freq: Every day | TRANSDERMAL | Status: DC
  Administered 2018-10-08 – 2018-10-18 (×11): 14 mg via TRANSDERMAL
  Filled 2018-10-08 (×10): qty 1

## 2018-10-08 MED ORDER — ENOXAPARIN SODIUM 60 MG/0.6ML ~~LOC~~ SOLN
55.0000 mg | SUBCUTANEOUS | Status: DC
  Administered 2018-10-08: 55 mg via SUBCUTANEOUS
  Filled 2018-10-08: qty 0.6

## 2018-10-08 MED ORDER — POTASSIUM CHLORIDE CRYS ER 20 MEQ PO TBCR
40.0000 meq | EXTENDED_RELEASE_TABLET | Freq: Once | ORAL | Status: AC
  Administered 2018-10-08: 40 meq via ORAL
  Filled 2018-10-08: qty 2

## 2018-10-08 NOTE — Consult Note (Addendum)
Cardiology Consultation:   Patient ID: Cory Palmer; [id number]; 01/18/1961   Admit date: 10/04/2018 Date of Consult: 10/08/2018  Primary Care Provider: Patient, [name] Per Primary Cardiologist: New to [facility]  Patient Profile:   DONTAI Palmer is a 58 y.o. male with a hx of anxiety, CHF, depressed bipolar disorder, type 2 diabetes and hypertension who is being seen today for the evaluation of new onset atrial fibrillation at the request of Dr. Roda Shutters.  History of Present Illness:   [name] is a 58yo M with a hx as stated above who initially presented to [facility] on 10/05/2018 with intermittent chest pain for several weeks with associated diaphoresis, SOB, abdominal pain and N/V. He was noted to have recently undergone a left partial foot amputation per Dr. [name]. He states that he was seen in Dr. Burna Sis office recently and was told that he could bare weight to his heel. After doing so, he reports the wound "opened up" and was bleeding profusely. History difficult to follow. He states that that the chest pain began around the same time as his left foot concerns. He has no prior hx of CAD although reports that he underwent a stress test at some point while living in [facility] or [place]. He assumes that this was normal given that he never heard anything further. He denies needing a cardiac cath or stenting. He does not recall if there is a family hx of CAD or AF. He continues to smoke cigarettes and has a hx of uncontrolled DM2. He reports that medication compliance is sometimes difficult for him given that he is homeless. Social work has been consulted for assistance.    On hospital presentation his temp was 99.7. He received IV antibiotics and was fluid resuscitated. Lactic acid was 2.6 and was reduced to 1.8 after fluids. He was flu negative. CXR with no acute cardiopulmonary findings. 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   He denies recurrence of chest pain during his hospitalization although has tenderness  to palpation. Yesterday, pt went into atrial fibrillation with rate control. Cardiology was consulted for further evaluation.   Past Medical History:  Diagnosis Date  . Anxiety   . CHF (congestive heart failure) (HCC)   . Depressed bipolar disorder (HCC)   . Diabetes mellitus without complication (HCC)   . Hypertension     Past Surgical History:  Procedure Laterality Date  . AMPUTATION Left 09/28/2018   Procedure: LEFT TRANSMETATARSAL AMPUTATION;  Surgeon: Nadara Mustard, MD;  Location: [facility] OR;  Service: Orthopedics;  Laterality: Left;  . BELOW KNEE LEG AMPUTATION Right      Prior to Admission medications   Medication Sig Start Date End Date Taking? Authorizing Provider  acetaminophen (TYLENOL) 325 MG tablet Take 2 tablets (650 mg total) by mouth every 6 (six) hours as needed for mild pain (or Fever >/= 101). 05/10/18  Yes Danford, Earl Lites, MD  metFORMIN (GLUCOPHAGE) 1000 MG tablet Take 1 tablet (1,000 mg total) by mouth 2 (two) times daily with a meal. 08/28/18  Yes Ward, Kristen N, DO  QUEtiapine (SEROQUEL) 400 MG tablet Take 1 tablet (400 mg total) by mouth at bedtime. 08/28/18  Yes Ward, Layla Maw, DO  traZODone (DESYREL) 100 MG tablet Take 1 tablet (100 mg total) by mouth at bedtime. 08/28/18  Yes Ward, Layla Maw, DO  doxycycline (VIBRAMYCIN) 100 MG capsule Take 1 capsule (100 mg total) by mouth 2 (two) times daily. Patient not taking: Reported on 10/05/2018 08/28/18  Ward, Layla Maw, DO  feeding supplement, GLUCERNA SHAKE, (GLUCERNA SHAKE) LIQD Take 237 mLs by mouth 2 (two) times daily between meals. Patient not taking: Reported on 10/05/2018 05/10/18   Alberteen Sam, MD  Multiple Vitamin (MULTIVITAMIN WITH MINERALS) TABS tablet Take 1 tablet by mouth daily. Patient not taking: Reported on 10/05/2018 05/11/18   Alberteen Sam, MD    Inpatient Medications: Scheduled Meds: . enoxaparin (LOVENOX) injection  55 mg Subcutaneous Q24H  . insulin aspart  0-20 Units  Subcutaneous TID WC  . magnesium oxide  400 mg Oral Daily  . nicotine  14 mg Transdermal Daily  . polyethylene glycol  17 g Oral BID  . QUEtiapine  400 mg Oral QHS  . senna-docusate  1 tablet Oral BID  . tamsulosin  0.4 mg Oral QPC supper  . traZODone  100 mg Oral QHS  . vitamin B-12  1,000 mcg Oral Daily   Continuous Infusions: . sodium chloride 75 mL/hr (10/08/18 0902)  .  ceFAZolin (ANCEF) IV 2 g (10/08/18 0543)   PRN Meds: acetaminophen, nicotine, ondansetron (ZOFRAN) IV  Allergies:   No Known Allergies  Social History:   Social History   Socioeconomic History  . Marital status: Single    Spouse name: Not on file  . Number of children: Not on file  . Years of education: Not on file  . Highest education level: Not on file  Occupational History  . Not on file  Social Needs  . Financial resource strain: Patient refused  . Food insecurity:    Worry: Patient refused    Inability: Patient refused  . Transportation needs:    Medical: Patient refused    Non-medical: Patient refused  Tobacco Use  . Smoking status: Current Every Day Smoker    Types: Cigarettes  . Smokeless tobacco: Never Used  Substance and Sexual Activity  . Alcohol use: Yes    Comment: occasional  . Drug use: Not Currently  . Sexual activity: Not on file  Lifestyle  . Physical activity:    Days per week: Patient refused    Minutes per session: Patient refused  . Stress: Not on file  Relationships  . Social connections:    Talks on phone: Not on file    Gets together: Not on file    Attends religious service: Not on file    Active member of club or organization: Not on file    Attends meetings of clubs or organizations: Not on file    Relationship status: Not on file  . Intimate partner violence:    Fear of current or ex partner: Not on file    Emotionally abused: Not on file    Physically abused: Not on file    Forced sexual activity: Not on file  Other Topics Concern  . Not on file    Social History Narrative  . Not on file    Family History:   Family History  Problem Relation Age of Onset  . Hypertension Other   . Diabetes Mellitus II Other    Family Status:  Family Status  Relation Name Status  . Other  (Not Specified)   ROS:  Please see the history of present illness.  All other ROS reviewed and negative.     Physical Exam/Data:   Vitals:   10/08/18 0612 10/08/18 0744 10/08/18 0900 10/08/18 1000  BP:   (!) 114/101   Pulse: (!) 54  (!) 53 71  Resp: (!) 25  18 19  Temp: 98.9 F (37.2 C) 97.8 F (36.6 C)    TempSrc:  Oral    SpO2: 98%  94% 97%  Weight:      Height:        Intake/Output Summary (Last 24 hours) at 10/08/2018 1145 Last data filed at 10/08/2018 1000 Gross per 24 hour  Intake 720 ml  Output 3525 ml  Net -2805 ml   Filed Weights   10/05/18 1515 10/07/18 0332 10/08/18 0302  Weight: 117.1 kg 109 kg 109.7 kg   Body mass index is 34.7 kg/m.   General: Older than stated age, NAD Skin: Warm, dry, intact  Head: Normocephalic, atraumatic, clear, moist mucus membranes. Neck: Negative for carotid bruits. No JVD Lungs:Clear to ausculation bilaterally. No wheezes, rales, or rhonchi. Breathing is unlabored. Cardiovascular: Irregularly irregular with S1 S2. No murmurs, rubs, gallops, or LV heave appreciated. Abdomen: Soft, non-tender, non-distended with normoactive bowel sounds. No obvious abdominal masses. MSK: Strength and tone appear normal for age. 5/5 in all extremities Extremities: No edema. No clubbing or cyanosis.  Neuro: Alert and oriented. No focal deficits. No facial asymmetry. MAE spontaneously. Psych: Responds to questions appropriately with normal affect.     EKG:  The EKG was personally reviewed and demonstrates:  No tracing since 05/08/2018 Telemetry:  Telemetry was personally reviewed and demonstrates:  Atrial fibrillation with HR 50-60's   Relevant CV Studies:  ECHO: 05/08/2018 Study Conclusions  - Left ventricle:  The cavity size was mildly dilated. Wall thickness was increased in a pattern of mild LVH. Systolic function was normal. The estimated ejection fraction was in the range of 55% to 60%. Wall motion was normal; there were no regional wall motion abnormalities. Left ventricular diastolic function parameters were normal. - Mitral valve: Calcified annulus. - Left atrium: The atrium was moderately dilated.  Impressions:  - Normal LV function; mild LVE; mild LVH; moderate LAE.   CATH: None   Laboratory Data:  Chemistry Recent Labs  Lab 10/06/18 0237 10/07/18 0830 10/08/18 0508  NA 136 135 139  K 3.4* 3.8 3.5  CL 109 110 111  CO2 19* 18* 21*  GLUCOSE 125* 216* 163*  BUN CREATININE 0.73 0.78 0.78  CALCIUM 7.8* 7.8* 8.0*  GFRNONAA >60 >60 >60  GFRAA >60 >60 >60  ANIONGAP Total Protein  Date Value Ref Range Status  10/08/2018 5.2 (L) 6.5 - 8.1 g/dL Final   Albumin  Date Value Ref Range Status  10/08/2018 2.4 (L) 3.5 - 5.0 g/dL Final   AST  Date Value Ref Range Status  10/08/2018 19 15 - 41 U/L Final   ALT  Date Value Ref Range Status  10/08/2018 10 0 - 44 U/L Final   Alkaline Phosphatase  Date Value Ref Range Status  10/08/2018 89 38 - 126 U/L Final   Total Bilirubin  Date Value Ref Range Status  10/08/2018 0.8 0.3 - 1.2 mg/dL Final   Hematology Recent Labs  Lab 10/06/18 0237 10/07/18 0830 10/08/18 0508  WBC 5.6 4.1 3.7*  RBC 2.65* 2.67* 2.85*  HGB 8.6* 8.6* 8.9*  HCT 24.9* 25.2* 26.7*  MCV 94.0 94.4 93.7  MCH 32.5 32.2 31.2  MCHC 34.5 34.1 33.3  RDW 13.6 13.4 13.4  PLT 87* 90* 99*   Cardiac Enzymes Recent Labs  Lab 10/05/18 0618 10/05/18 1207  TROPONINI <0.03 <0.03    Recent Labs  Lab 10/04/18 2345  TROPIPOC 0.00    BNPNo results for input(s):  BNP, PROBNP in the last 168 hours.  DDimer No results for input(s): DDIMER in the last 168 hours. TSH:  Lab Results  Component Value Date   TSH 3.270 05/08/2018    Lipids:No results found for: CHOL, HDL, LDLCALC, LDLDIRECT, TRIG, CHOLHDL HgbA1c: Lab Results  Component Value Date   HGBA1C 6.6 (H) 09/28/2018    Radiology/Studies:  Dg Chest 2 View  Result Date: 10/04/2018 CLINICAL DATA:  Cough for 4 days EXAM: CHEST - 2 VIEW COMPARISON:  05/08/2018 FINDINGS: Mild cardiomegaly. No overt pulmonary edema. Both lungs are clear. The visualized skeletal structures are unremarkable. IMPRESSION: No active cardiopulmonary disease.  Mild cardiomegaly Electronically Signed   By: Jasmine Pang M.D.   On: 10/04/2018 22:52   Dg Foot Complete Left  Result Date: 10/05/2018 CLINICAL DATA:  Recent foot surgery possible wound infection. EXAM: LEFT FOOT - COMPLETE 3+ VIEW COMPARISON:  Preoperative radiograph 09/24/2018 FINDINGS: Post transmetatarsal amputation of foot. The resection margins are sharp. No periosteal reaction or bony destructive change. Three linear foreign bodies projecting over the foot are unchanged prior exam, likely in the soft tissues, less likely external to the patient given persistence on pre and postoperative imaging. No tracking soft tissue air. Overlying dressing in place. IMPRESSION: Post transmetatarsal amputation of the foot. No radiographic findings of osteomyelitis. Electronically Signed   By: Narda Rutherford M.D.   On: 10/05/2018 00:04   Assessment and Plan:   1. New onset atrial fibrillation: -Presented with chest pain and flu like symptoms, found to be septic from recent left foot amputation and MSSA/staph positive bacteremia now with  new onset AF with rate control and intermittent (<2.0s) pauses. He appears to be asymptomatic. -Last TSH noted to be 3.270 on 05/08/2018>>will obtain more current level -Per IM noted, pt has had issues with surgical wound bleeding as well as acute infection and would not be a candidate for anticoagulation at this time  -Likely AF in the setting of the above. Would monitor closely and watch for conversion to  NSR once acute illness subsides. -HR stable with no evidence of RVR>>>would not start BB given low normal rates given that he is currently asymptomatic  -If remains in AF for greater than 48h and once stable from a surgical and infectious standpoint, can then consider [facility] however compliance may likely be a high concern given unfortunate social determinants  -Will schedule for OP follow up at discharge for several weeks out. If he continues to be in AF, could consider further workup with DCCV and AC as well as possible ischemic workup  -CHA2DS2VASc = 3( CHF, HTN, DM2)  2. Sepsis in the setting of MSSA and staph bacteremia s/p recent transmetatarsal left foor amputation: -Was febrile on presentation with elevated lactic acid which resolved with fluid resuscitation  -Left foot imaging revealed osteomyelitis>>with blood cultures positive for MSSA/staph bacteremia  -IV antibiotics per IM/infectious disease -WOC consult for dressing management  -Per infectious disease> TTE to rule out endocarditis with no evidence of vegetation   3. Chest pain on presentation with no prior hx of CAD: -No prior hx of CAD although reports having prior stress test that was "normal" but no records in EPic or care everywhere  -Trop, negative x 2  -Will obtain more current EKG however do not suspect any changes -Chest pain now reproducible on exam  -Echocardiogram performed 10/05/2018 with LVEF of 60-65% with no mention of wall motion abnormalalities. The mitral valve is degenerative in appearance. Moderate thickening of the mitral valve  leaflet. Mild calcification of the mitral valve leaflet. There is moderate mitral annular calcification present  4. DM2: -HbA1c, 6.6 on 09/28/2018 -SSI for glucose control per primary team   5. Bipolar disorder with depression: -Psychiatric consultation with recommendations for suicidal ideation 10/06/2018 -Plan was for social work consultation to address living situation  -Started on  Seroquel and Trazadone   6. HTN: -Stable, 114/101>102/63 -Not currently on antihypertensives>>hold off on BB for rate control given BP and stable HR   7. Tobacco Use: -Nicotine patch in place -Cessation encouraged    For questions or updates, please contact [facility] Please consult [email or web address] for contact info under Cardiology/STEMI.   SignedGeorgie Chard NP-C [facility] Pager: [phone] 10/08/2018 11:45 AM

## 2018-10-08 NOTE — Plan of Care (Signed)
  Problem: Education: Goal: Knowledge of General Education information will improve Description Including pain rating scale, medication(s)/side effects and non-pharmacologic comfort measures Outcome: Progressing   Problem: Health Behavior/Discharge Planning: Goal: Ability to manage health-related needs will improve Outcome: Progressing   

## 2018-10-08 NOTE — Clinical Social Work Note (Signed)
Clinical Social Work Assessment  Patient Details  Name: Cory Palmer MRN: [id number] Date of Birth: 20-Jul-1961  Date of referral:  10/08/18               Reason for consult:  Facility Placement                Permission sought to share information with:  Facility Medical sales representative, Family Supports Permission granted to share information::     Name::        Agency::  SNFs  Relationship::     Contact Information:     Housing/Transportation Living arrangements for the past 2 months:  Hotel/Motel Source of Information:  Patient Patient Interpreter Needed:  None Criminal Activity/Legal Involvement Pertinent to Current Situation/Hospitalization:  No - Comment as needed Significant Relationships:  None Lives with:  Self Do you feel safe going back to the place where you live?  No Need for family participation in patient care:  No (Coment)  Care giving concerns:  CSW received consult for possible SNF placement at time of discharge. CSW spoke with patient regarding PT recommendation of SNF placement at time of discharge. Patient reported that he had been staying with a couple at an extended stay motel [phone]) in order to better afford it.  He reports that he now believes they are "Shady" and they have his prosthetic leg and all his other things. He states that they said they would bring it to the hospital but did not show up. He contacted the police, but they reported that patient would have to meet them at the hotel for them to do anything. Patient expressed understanding of PT recommendation and is agreeable to SNF placement at time of discharge, even though he states he can get around if he had his new leg. CSW to continue to follow and assist with discharge planning needs.   Social Worker assessment / plan:  CSW spoke with patient concerning possibility of rehab at [facility].  Employment status:  Disabled (Comment on whether or not currently receiving Disability)(Disability  $1200/month) Insurance information:  Managed Medicare PT Recommendations:  Skilled Nursing Facility Information / Referral to community resources:  Skilled Nursing Facility  Patient/Family's Response to care: Patient states he would rather go to an apartment but CSW explained that we are only able to offer emergency shelters or rehab. Patient recognizes need for rehab before returning home and is agreeable to a SNF in [facility]. Patient has been to several SNFs before. CSW provided homeless resources and substance use resources, which may complicate the SNF offers.   Patient/Family's Understanding of and Emotional Response to Diagnosis, Current Treatment, and Prognosis:  Patient/family is realistic regarding therapy needs and expressed being hopeful for SNF placement. Patient expressed understanding of CSW role and discharge process as well as medical condition. No questions/concerns about plan or treatment.    Emotional Assessment Appearance:  Appears stated age Attitude/Demeanor/Rapport:  Engaged, Gracious Affect (typically observed):  Accepting, Appropriate Orientation:  Oriented to Self, Oriented to Place, Oriented to  Time, Oriented to Situation Alcohol / Substance use:  Illicit Drugs Psych involvement (Current and /or in the community):  No (Comment)  Discharge Needs  Concerns to be addressed:  Care Coordination Readmission within the last 30 days:  Yes Current discharge risk:  Homeless, Substance Abuse Barriers to Discharge:  Continued Medical Work up   [employer], LCSW 10/08/2018, 3:34 PM

## 2018-10-08 NOTE — [place] FL2 (Signed)
[place] MEDICAID FL2 LEVEL OF CARE SCREENING TOOL     IDENTIFICATION  Patient Name: Cory Palmer Birthdate: 14-Jul-1961 Sex: male Admission Date (Current Location): 10/04/2018  [facility] and [place] Number:  Producer, television/film/video and Address:  [facility], [address] [place], [place] [address]      Provider Number: [id number]  Attending Physician Name and Address:  Albertine Grates, MD  Relative Name and Phone Number:       Current Level of Care: Hospital Recommended Level of Care: Skilled Nursing Facility Prior Approval Number:    Date Approved/Denied:   PASRR Number: Pending  Discharge Plan: SNF    Current Diagnoses: Patient Active Problem List   Diagnosis Date Noted  . Atrial fibrillation (HCC)   . Bacteremia due to Gram-positive bacteria   . Hypokalemia   . Hypomagnesemia   . Sepsis without acute organ dysfunction (HCC) 10/05/2018  . Depressed bipolar disorder (HCC) 10/05/2018  . Hypertension 10/05/2018  . Hyperbilirubinemia 10/05/2018  . S/P transmetatarsal amputation of foot, 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  . Osteomyelitis of toe of left foot (HCC)   . Wound infection 05/07/2018  . Homelessness 05/07/2018  . DM (diabetes mellitus), type 2 with complications (HCC) 05/07/2018  . Acute osteomyelitis involving lower leg, right (HCC) 05/07/2018    Orientation RESPIRATION BLADDER Height & Weight     Self, Time, Situation, Place  Normal Continent Weight: 109.7 kg Height:  5\' 10"  (177.8 cm)  BEHAVIORAL SYMPTOMS/MOOD NEUROLOGICAL BOWEL NUTRITION STATUS      Continent Diet(Please see DC Summary)  AMBULATORY STATUS COMMUNICATION OF NEEDS Skin   Extensive Assist Verbally Other (Comment)(Wound on foot)                       Personal Care Assistance Level of Assistance  Bathing, Feeding, Dressing Bathing Assistance: Limited assistance Feeding assistance: Independent Dressing Assistance: Limited assistance     Functional Limitations Info   Sight, Hearing, Speech Sight Info: Adequate Hearing Info: Adequate Speech Info: Adequate    SPECIAL CARE FACTORS FREQUENCY  PT (By licensed PT), OT (By licensed OT)     PT Frequency: 5x/week OT Frequency: 3x/week            Contractures Contractures Info: Not present    Additional Factors Info  Code Status, Allergies, Psychotropic, Insulin Sliding Scale Code Status Info: Full Allergies Info: NKA Psychotropic Info: Seroquel; Trazadone Insulin Sliding Scale Info: 3x daily with meals       Current Medications (10/08/2018):  This is the current hospital active medication list Current Facility-Administered Medications  Medication Dose Route Frequency Provider Last Rate Last Dose  . 0.9 %  sodium chloride infusion   Intravenous Continuous Albertine Grates, MD 75 mL/hr at 10/08/18 0902 75 mL/hr at 10/08/18 0902  . acetaminophen (TYLENOL) tablet 650 mg  650 mg Oral Q4H PRN Bobette Mo, MD   650 mg at 10/07/18 0908  . ceFAZolin (ANCEF) IVPB 2g/100 mL premix  2 g Intravenous Janit Bern, MD 200 mL/hr at 10/08/18 1402 2 g at 10/08/18 1402  . enoxaparin (LOVENOX) injection 55 mg  55 mg Subcutaneous Q24H Norva Pavlov, RPH   55 mg at 10/08/18 1402  . insulin aspart (novoLOG) injection 0-20 Units  0-20 Units Subcutaneous TID WC Bobette Mo, MD   4 Units at 10/08/18 [phone]  . magnesium oxide (MAG-OX) tablet 400 mg  400 mg Oral Daily Albertine Grates, MD   400 mg at  10/08/18 1056  . nicotine (NICODERM CQ - dosed in mg/24 hours) patch 14 mg  14 mg Transdermal Daily Albertine Grates, MD   14 mg at 10/08/18 1124  . nicotine (NICODERM CQ - dosed in mg/24 hours) patch 21 mg  21 mg Transdermal Daily PRN Bobette Mo, MD   21 mg at 10/08/18 1123  . ondansetron (ZOFRAN) injection 4 mg  4 mg Intravenous Q6H PRN Bobette Mo, MD      . polyethylene glycol [facility]) packet 17 g  17 g Oral BID Albertine Grates, MD   17 g at 10/08/18 1056  . QUEtiapine (SEROQUEL) tablet 400 mg  400 mg Oral  QHS Bobette Mo, MD   400 mg at 10/07/18 2018  . senna-docusate (Senokot-S) tablet 1 tablet  1 tablet Oral BID Albertine Grates, MD   1 tablet at 10/08/18 1057  . tamsulosin (FLOMAX) capsule 0.4 mg  0.4 mg Oral QPC supper Albertine Grates, MD   0.4 mg at 10/07/18 1826  . traZODone (DESYREL) tablet 100 mg  100 mg Oral QHS Bobette Mo, MD   100 mg at 10/07/18 2018  . vitamin B-12 (CYANOCOBALAMIN) tablet 1,000 mcg  1,000 mcg Oral Daily Albertine Grates, MD   1,000 mcg at 10/08/18 1057     Discharge Medications: Please see discharge summary for a list of discharge medications.  Relevant Imaging Results:  Relevant Lab Results:   Additional Information SSN: [id number]  Renne Crigler Catha Ontko, LCSW

## 2018-10-08 NOTE — Progress Notes (Signed)
[facility]  Date of Admission:  10/04/2018     Total days of antibiotics 5        ASSESSMENT/PLAN  Cory Palmer has MSSA and Group C Streptococcus bacteremia with likely source being wound infection from his recent transmetatarsal amputation. There is no evidence of osteomyelitis and can likely be treated as a soft tissue infection with 2 weeks of Keflex. He has remained afebrile and without leukocytosis. Repeat cultures are without growth to date. Will need continued wound care.  1. Continue current dose of cefazolin with transition to Keflex at discharge. Would treat from starting date 3/7 if cultures remain clear. 2. Continue to monitor cultures and for fever 3. Wound care per primary team.    Principal Problem:   Sepsis without acute organ dysfunction (HCC) Active Problems:   Homelessness   DM (diabetes mellitus), type 2 with complications (HCC)   S/P transmetatarsal amputation of foot, left (HCC)   Depressed bipolar disorder (HCC)   Hypertension   Hyperbilirubinemia   Bacteremia due to Gram-positive bacteria   Hypokalemia   Hypomagnesemia   Atrial fibrillation (HCC)   . enoxaparin (LOVENOX) injection  55 mg Subcutaneous Q24H  . insulin aspart  0-20 Units Subcutaneous TID WC  . magnesium oxide  400 mg Oral Daily  . nicotine  14 mg Transdermal Daily  . polyethylene glycol  17 g Oral BID  . QUEtiapine  400 mg Oral QHS  . senna-docusate  1 tablet Oral BID  . tamsulosin  0.4 mg Oral QPC supper  . traZODone  100 mg Oral QHS  . vitamin B-12  1,000 mcg Oral Daily    SUBJECTIVE:  Afebrile overnight with no acute events. Repeat cultures remain without growth to date.  Trying to get in contact with friends that have a piece of his prosthetic.   No Known Allergies   Review of Systems: Review of Systems  Constitutional: Negative for chills, fever and weight loss.  Respiratory: Negative for cough, shortness of breath and wheezing.     Cardiovascular: Negative for chest pain and leg swelling.  Gastrointestinal: Negative for abdominal pain, constipation, diarrhea, nausea and vomiting.  Skin: Negative for rash.      OBJECTIVE: Vitals:   10/08/18 0900 10/08/18 1000 10/08/18 1146 10/08/18 1350  BP: (!) 114/101   132/86  Pulse: (!) 53 71    Resp: 18 19    Temp:   97.8 F (36.6 C) 98.4 F (36.9 C)  TempSrc:   Oral Oral  SpO2: 94% 97%    Weight:      Height:       Body mass index is 34.7 kg/m.  Physical Exam Constitutional:      General: He is not in acute distress.    Appearance: He is well-developed.  Cardiovascular:     Rate and Rhythm: Normal rate and regular rhythm.     Heart sounds: Normal heart sounds.  Pulmonary:     Effort: Pulmonary effort is normal.     Breath sounds: Normal breath sounds.  Musculoskeletal:     Comments: Right BKA and left transmetatarsal amputation. Dressing is clean, dry and intact.   Skin:    General: Skin is warm and dry.  Neurological:     Mental Status: He is alert.  Psychiatric:        Thought Content: Thought content normal.        Judgment: Judgment normal.     Lab Results Lab Results  Component  Value Date   WBC 3.7 (L) 10/08/2018   HGB 8.9 (L) 10/08/2018   HCT 26.7 (L) 10/08/2018   MCV 93.7 10/08/2018   PLT 99 (L) 10/08/2018    Lab Results  Component Value Date   CREATININE 0.78 10/08/2018   BUN 6 10/08/2018   NA 139 10/08/2018   K 3.5 10/08/2018   CL 111 10/08/2018   CO2 21 (L) 10/08/2018    Lab Results  Component Value Date   ALT 10 10/08/2018   AST 19 10/08/2018   ALKPHOS 89 10/08/2018   BILITOT 0.8 10/08/2018     Microbiology: Recent Results (from the past 240 hour(s))  Blood Culture (routine x 2)     Status: Abnormal (Preliminary result)   Collection Time: 10/04/18 10:34 PM  Result Value Ref Range Status   Specimen Description BLOOD LEFT HAND  Final   Special Requests   Final    BOTTLES DRAWN AEROBIC AND ANAEROBIC Blood Culture  adequate volume   Culture  Setup Time   Final    GRAM POSITIVE COCCI AEROBIC BOTTLE ONLY Organism ID to follow CRITICAL RESULT CALLED TO, READ BACK BY AND VERIFIED WITH: PHRMD M PHAM @1356  10/05/2018 BY S GEZAHEGN    Culture (A)  Final    STAPHYLOCOCCUS AUREUS SUSCEPTIBILITIES TO FOLLOW STREPTOCOCCUS GROUP C SUSCEPTIBILITIES TO FOLLOW Performed at [facility], [address], [place], [place] [address]    Report Status PENDING  Incomplete  Blood Culture ID Panel (Reflexed)     Status: Abnormal   Collection Time: 10/04/18 10:34 PM  Result Value Ref Range Status   Enterococcus species NOT DETECTED NOT DETECTED Final   Listeria monocytogenes NOT DETECTED NOT DETECTED Final   Staphylococcus species DETECTED (A) NOT DETECTED Final    Comment: CRITICAL RESULT CALLED TO, READ BACK BY AND VERIFIED WITH: PHRMD M PHAM @1356  10/05/2018 BY S GEZAHEGN    Staphylococcus aureus (BCID) DETECTED (A) NOT DETECTED Final    Comment: Methicillin (oxacillin) susceptible Staphylococcus aureus (MSSA). Preferred therapy is anti staphylococcal beta lactam antibiotic (Cefazolin or Nafcillin), unless clinically contraindicated. CRITICAL RESULT CALLED TO, READ BACK BY AND VERIFIED WITH: PHRMD M PHAM @1356  10/05/2018 BY S GEZAHEGN    Methicillin resistance NOT DETECTED NOT DETECTED Final   Streptococcus species DETECTED (A) NOT DETECTED Final    Comment: Not Enterococcus species, Streptococcus agalactiae, Streptococcus pyogenes, or Streptococcus pneumoniae. CRITICAL RESULT CALLED TO, READ BACK BY AND VERIFIED WITH: PHRMD M PHAM @1356  10/05/2018 BY S GEZAHEGN    Streptococcus agalactiae NOT DETECTED NOT DETECTED Final   Streptococcus pneumoniae NOT DETECTED NOT DETECTED Final   Streptococcus pyogenes NOT DETECTED NOT DETECTED Final   Acinetobacter baumannii NOT DETECTED NOT DETECTED Final   Enterobacteriaceae species NOT DETECTED NOT DETECTED Final   Enterobacter cloacae complex NOT DETECTED NOT DETECTED  Final   Escherichia coli NOT DETECTED NOT DETECTED Final   Klebsiella oxytoca NOT DETECTED NOT DETECTED Final   Klebsiella pneumoniae NOT DETECTED NOT DETECTED Final   Proteus species NOT DETECTED NOT DETECTED Final   Serratia marcescens NOT DETECTED NOT DETECTED Final   Haemophilus influenzae NOT DETECTED NOT DETECTED Final   Neisseria meningitidis NOT DETECTED NOT DETECTED Final   Pseudomonas aeruginosa NOT DETECTED NOT DETECTED Final   Candida albicans NOT DETECTED NOT DETECTED Final   Candida glabrata NOT DETECTED NOT DETECTED Final   Candida krusei NOT DETECTED NOT DETECTED Final   Candida parapsilosis NOT DETECTED NOT DETECTED Final   Candida tropicalis NOT DETECTED NOT  DETECTED Final    Comment: Performed at [facility], [address] [place], [place] [address]  Blood Culture (routine x 2)     Status: None (Preliminary result)   Collection Time: 10/04/18 10:35 PM  Result Value Ref Range Status   Specimen Description BLOOD LEFT ANTECUBITAL  Final   Special Requests   Final    BOTTLES DRAWN AEROBIC ONLY Blood Culture results may not be optimal due to an inadequate volume of blood received in culture bottles   Culture   Final    NO GROWTH 4 DAYS Performed at [facility], [address], [place], [place] [address]    Report Status PENDING  Incomplete  Culture, blood (routine x 2)     Status: None (Preliminary result)   Collection Time: 10/06/18  2:37 AM  Result Value Ref Range Status   Specimen Description BLOOD RIGHT ANTECUBITAL  Final   Special Requests   Final    BOTTLES DRAWN AEROBIC ONLY Blood Culture results may not be optimal due to an inadequate volume of blood received in culture bottles   Culture   Final    NO GROWTH 2 DAYS Performed at [facility], [address], [place], [place] [address]    Report Status PENDING  Incomplete  Culture, blood (routine x 2)     Status: None (Preliminary result)   Collection Time: 10/06/18  2:37 AM  Result  Value Ref Range Status   Specimen Description BLOOD RIGHT ANTECUBITAL  Final   Special Requests   Final    BOTTLES DRAWN AEROBIC ONLY Blood Culture results may not be optimal due to an inadequate volume of blood received in culture bottles   Culture   Final    NO GROWTH 2 DAYS Performed at [facility], [address], [place], [place] [address]    Report Status PENDING  Incomplete     Marcos Eke, NP [facility] [phone] Pager  10/08/2018  3:55 PM

## 2018-10-08 NOTE — Progress Notes (Signed)
PROGRESS NOTE  Cory Palmer ZOX:[id number] DOB: March 23, 1961 DOA: 10/04/2018 PCP: Patient, [name] Per  HPI/Recap of past 24 hours:  He denies suicidal today,  He denies chest pain, no sob Left foot wrapped, no odor, no overt bleeding no fever,   He reports friends won't bring in his belongs from the motel he previously stayed, he called [place] police  He wants nicotine patch  Reports being constipated    Assessment/Plan: Principal Problem:   Sepsis without acute organ dysfunction (HCC) Active Problems:   Homelessness   DM (diabetes mellitus), type 2 with complications (HCC)   S/P transmetatarsal amputation of foot, left (HCC)   Depressed bipolar disorder (HCC)   Hypertension   Hyperbilirubinemia   Bacteremia due to Gram-positive bacteria   Hypokalemia   Hypomagnesemia   Atrial fibrillation (HCC)    Sepsis, mssa and strep bacteremia, likely source from foot -TTE done on 3/6 no vegetation  -Repeat blood culture obtained on 3/7 , no growth so far -on ancef, ID following  S/p transmetatarsal amputation of foot, left Wound care  Dr [name] will see patient in am  Afib with intermittent short pauses/bradycardia, does not appear to have symptom (likely new diagnosis) -continue tele -tsh unremarkable, keep k>4, mag>2 -he has anemia and thrombocytopenia, surgical wound bleeding (left foot) prior to coming to the hospital, not a good candidate for anticoagulation -Echo with adequate ef, bp stable - cardiology consulted, input appreciated.   Normocytic anemia,Chronic thrombocytopenia  b12 low normal ,started on  b12 supplement Monitor FOBT pending collection  Hypokalemia/hypomagnesemia Remain low, continue to Replace k/mag, recheck in am  noninsulin dependent diabetes with hyperglycemia A1c6.6 Home meds metformin held On ssi here  Bipolar /depression/ SI seroquel /trazodone  He denies SI, Psychiatry consulted, he is deemed to be safe, sitter discontinued per  psychiatry recommendation  Homeless /Polysubstance abuse (cocaine) HIV/hepatitis negative Social worker consult  he reports was at [place] detox facility for 8-9 months prior to relocating to [place] 4months ago, he needs to have pcp, he needs housing. He reports lives in a motel with a drug dealer prior to this hospitalization    Code Status: full  Family Communication: patient   Disposition Plan: SNF placement if cleared by ortho/ID/cards   Consultants:  Psych  ID  cards  Procedures:  none  Antibiotics:  Vanc/flagyl/cefepime x1 on admission  Ancef from 3/6   Objective: BP 132/86 (BP Location: Left Arm)   Pulse 71   Temp 98.4 F (36.9 C) (Oral)   Resp 19   Ht  (1.778 m)   Wt 109.7 kg   SpO2 97%   BMI 34.70 kg/m   Intake/Output Summary (Last 24 hours) at 10/08/2018 1351 Last data filed at 10/08/2018 1000 Gross per 24 hour  Intake 720 ml  Output 2625 ml  Net -1905 ml   Filed Weights   10/05/18 1515 10/07/18 0332 10/08/18 0302  Weight: 117.1 kg 109 kg 109.7 kg    Exam: Patient is examined daily including today on 10/08/2018, exams remain the same as of yesterday except that has changed    General:  NAD  Cardiovascular: RRR  Respiratory: CTABL  Abdomen: Soft/ND/NT, positive BS  Musculoskeletal: No Edema, left foot amputation, dressing in place   Neuro: alert, oriented   Data Reviewed: Basic Metabolic Panel: Recent Labs  Lab 10/04/18 2231 10/05/18 0618 10/06/18 0237 10/07/18 0830 10/08/18 0508  NA 135 136 136 135 139  K 3.4* 2.9* 3.4* 3.8 3.5  CL 108  106 109 110 111  CO2 18* 19* 19* 18* 21*  GLUCOSE 220* 169* 125* 216* 163*  BUN 14 14 11 7 6   CREATININE 0.96 0.95 0.73 0.78 0.78  CALCIUM 8.5* 8.2* 7.8* 7.8* 8.0*  MG  --   --  1.6* 1.5* 1.8   Liver Function Tests: Recent Labs  Lab 10/04/18 2231 10/05/18 0618 10/08/18 0508  AST 28 20 19   ALT 14 13 10   ALKPHOS 112 90 89  BILITOT 5.2* 3.0* 0.8  PROT 7.2 6.2* 5.2*    ALBUMIN 3.7 3.1* 2.4*   Recent Labs  Lab 10/04/18 2231  LIPASE 21   No results for input(s): AMMONIA in the last 168 hours. CBC: Recent Labs  Lab 10/04/18 2231 10/05/18 0618 10/06/18 0237 10/07/18 0830 10/08/18 0508  WBC 19.7* 17.1* 5.6 4.1 3.7*  NEUTROABS 16.7* 12.5* 3.7 2.4 2.0  HGB 12.4* 10.4* 8.6* 8.6* 8.9*  HCT 36.4* 30.9* 24.9* 25.2* 26.7*  MCV 93.8 93.9 94.0 94.4 93.7  PLT 182 138* 87* 90* 99*   Cardiac Enzymes:   Recent Labs  Lab 10/05/18 0618 10/05/18 1207  TROPONINI <0.03 <0.03   BNP (last 3 results) No results for input(s): BNP in the last 8760 hours.  ProBNP (last 3 results) No results for input(s): PROBNP in the last 8760 hours.  CBG: Recent Labs  Lab 10/07/18 1240 10/07/18 1700 10/07/18 2120 10/08/18 0743 10/08/18 1145  GLUCAP 125* 143* 145* 175* 115*    Recent Results (from the past 240 hour(s))  Blood Culture (routine x 2)     Status: Abnormal (Preliminary result)   Collection Time: 10/04/18 10:34 PM  Result Value Ref Range Status   Specimen Description BLOOD LEFT HAND  Final   Special Requests   Final    BOTTLES DRAWN AEROBIC AND ANAEROBIC Blood Culture adequate volume   Culture  Setup Time   Final    GRAM POSITIVE COCCI AEROBIC BOTTLE ONLY Organism ID to follow CRITICAL RESULT CALLED TO, READ BACK BY AND VERIFIED WITH: PHRMD M PHAM @1356  10/05/2018 BY S GEZAHEGN    Culture (A)  Final    STAPHYLOCOCCUS AUREUS SUSCEPTIBILITIES TO FOLLOW STREPTOCOCCUS GROUP C SUSCEPTIBILITIES TO FOLLOW Performed at [facility], [address], [place], [place] [address]    Report Status PENDING  Incomplete  Blood Culture ID Panel (Reflexed)     Status: Abnormal   Collection Time: 10/04/18 10:34 PM  Result Value Ref Range Status   Enterococcus species NOT DETECTED NOT DETECTED Final   Listeria monocytogenes NOT DETECTED NOT DETECTED Final   Staphylococcus species DETECTED (A) NOT DETECTED Final    Comment: CRITICAL RESULT CALLED TO, READ  BACK BY AND VERIFIED WITH: PHRMD M PHAM @1356  10/05/2018 BY S GEZAHEGN    Staphylococcus aureus (BCID) DETECTED (A) NOT DETECTED Final    Comment: Methicillin (oxacillin) susceptible Staphylococcus aureus (MSSA). Preferred therapy is anti staphylococcal beta lactam antibiotic (Cefazolin or Nafcillin), unless clinically contraindicated. CRITICAL RESULT CALLED TO, READ BACK BY AND VERIFIED WITH: PHRMD M PHAM @1356  10/05/2018 BY S GEZAHEGN    Methicillin resistance NOT DETECTED NOT DETECTED Final   Streptococcus species DETECTED (A) NOT DETECTED Final    Comment: Not Enterococcus species, Streptococcus agalactiae, Streptococcus pyogenes, or Streptococcus pneumoniae. CRITICAL RESULT CALLED TO, READ BACK BY AND VERIFIED WITH: PHRMD M PHAM @1356  10/05/2018 BY S GEZAHEGN    Streptococcus agalactiae NOT DETECTED NOT DETECTED Final   Streptococcus pneumoniae NOT DETECTED NOT DETECTED Final   Streptococcus pyogenes NOT DETECTED  NOT DETECTED Final   Acinetobacter baumannii NOT DETECTED NOT DETECTED Final   Enterobacteriaceae species NOT DETECTED NOT DETECTED Final   Enterobacter cloacae complex NOT DETECTED NOT DETECTED Final   Escherichia coli NOT DETECTED NOT DETECTED Final   Klebsiella oxytoca NOT DETECTED NOT DETECTED Final   Klebsiella pneumoniae NOT DETECTED NOT DETECTED Final   Proteus species NOT DETECTED NOT DETECTED Final   Serratia marcescens NOT DETECTED NOT DETECTED Final   Haemophilus influenzae NOT DETECTED NOT DETECTED Final   Neisseria meningitidis NOT DETECTED NOT DETECTED Final   Pseudomonas aeruginosa NOT DETECTED NOT DETECTED Final   Candida albicans NOT DETECTED NOT DETECTED Final   Candida glabrata NOT DETECTED NOT DETECTED Final   Candida krusei NOT DETECTED NOT DETECTED Final   Candida parapsilosis NOT DETECTED NOT DETECTED Final   Candida tropicalis NOT DETECTED NOT DETECTED Final    Comment: Performed at [facility], [address], [place], [place] [address]  Blood  Culture (routine x 2)     Status: None (Preliminary result)   Collection Time: 10/04/18 10:35 PM  Result Value Ref Range Status   Specimen Description BLOOD LEFT ANTECUBITAL  Final   Special Requests   Final    BOTTLES DRAWN AEROBIC ONLY Blood Culture results may not be optimal due to an inadequate volume of blood received in culture bottles   Culture   Final    NO GROWTH 4 DAYS Performed at [facility], [address], [place], [place] [address]    Report Status PENDING  Incomplete  Culture, blood (routine x 2)     Status: None (Preliminary result)   Collection Time: 10/06/18  2:37 AM  Result Value Ref Range Status   Specimen Description BLOOD RIGHT ANTECUBITAL  Final   Special Requests   Final    BOTTLES DRAWN AEROBIC ONLY Blood Culture results may not be optimal due to an inadequate volume of blood received in culture bottles   Culture   Final    NO GROWTH 2 DAYS Performed at [facility], [address], [place], [place] [address]    Report Status PENDING  Incomplete  Culture, blood (routine x 2)     Status: None (Preliminary result)   Collection Time: 10/06/18  2:37 AM  Result Value Ref Range Status   Specimen Description BLOOD RIGHT ANTECUBITAL  Final   Special Requests   Final    BOTTLES DRAWN AEROBIC ONLY Blood Culture results may not be optimal due to an inadequate volume of blood received in culture bottles   Culture   Final    NO GROWTH 2 DAYS Performed at [facility], [address] [place], [place] [address]    Report Status PENDING  Incomplete     Studies: No results found.  Scheduled Meds: . enoxaparin (LOVENOX) injection  55 mg Subcutaneous Q24H  . insulin aspart  0-20 Units Subcutaneous TID WC  . magnesium oxide  400 mg Oral Daily  . nicotine  14 mg Transdermal Daily  . polyethylene glycol  17 g Oral BID  . QUEtiapine  400 mg Oral QHS  . senna-docusate  1 tablet Oral BID  . tamsulosin  0.4 mg Oral QPC supper  . traZODone  100 mg  Oral QHS  . vitamin B-12  1,000 mcg Oral Daily    Continuous Infusions: . sodium chloride 75 mL/hr (10/08/18 0902)  .  ceFAZolin (ANCEF) IV 2 g (10/08/18 0543)     Time  spent: 35mins, case discussed with infectious disease , ortho I have personally reviewed and interpreted on  10/08/2018 daily labs, tele strips, imagings as discussed above under date review session and assessment and plans.  I reviewed all nursing notes, pharmacy notes, consultant notes,  vitals, pertinent old records  I have discussed plan of care as described above with RN , patient on 10/08/2018   Albertine GratesFang Sharie Amorin MD, PhD  [facility] Pager [phone]. If 7PM-7AM, please contact night-coverage at [email or web address], password [email or web address] 10/08/2018, 1:51 PM  LOS: 3 days

## 2018-10-08 NOTE — Evaluation (Signed)
Physical Therapy Evaluation Patient Details Name: Cory Palmer MRN: [id number] DOB: 1960-12-18 Today's Date: 10/08/2018   History of Present Illness  58 yo male with onset of sepsis and chest pain was admitted, is homeless and has most recently been in a hotel with a couple.  Pt has unclear instructions about WB on LLE, no shoe and no supplies for use of R prosthesis for BK amputation.  Pt also reports prosthesis is scheduled for reconstruction.  PMHx:  L transmet amputation, bipolar depression, anxiety, CHF, DM, HTN,   Clinical Impression  Pt is up to side of bed with PT and avoided wb on LLE due to pain and his lack of wb information currently from MD.  Will progress as pt is permitted but have no liner and socks to don RLE prosthesis.   Pt was staying with a couple in a hotel room and now does not have contact with them to get the rest of his supplies.  Follow acutely for sliding transfers and will incorporate LLE as wb information is provided.    Follow Up Recommendations SNF    Equipment Recommendations  None recommended by PT(awaiting wb information and needs ortho shoe for LLE)    Recommendations for Other Services       Precautions / Restrictions Precautions Precautions: Fall Precaution Comments: pt has no liner to use R BK prosthesis Restrictions Weight Bearing Restrictions: Yes LLE Weight Bearing: (WB orders are not current, asking for MD guidance)      Mobility  Bed Mobility Overal bed mobility: Modified Independent             General bed mobility comments: takes extra time but can self mobiliize  Transfers Overall transfer level: Needs assistance Equipment used: 1 person hand held assist Transfers: Lateral/Scoot Transfers          Lateral/Scoot Transfers: Min guard;From elevated surface(for safety and until wb is clarified on LLE)    Ambulation/Gait             General Gait Details: no prosthesis(does not have liner and supplies to use R LE  prosthesis )  Stairs            Wheelchair Mobility    Modified Rankin (Stroke Patients Only)       Balance Overall balance assessment: Needs assistance Sitting-balance support: Bilateral upper extremity supported Sitting balance-Leahy Scale: Fair                                       Pertinent Vitals/Pain Pain Assessment: Faces Faces Pain Scale: Hurts little more Pain Location: L lower leg and foot with any movement Pain Descriptors / Indicators: Operative site guarding Pain Intervention(s): Limited activity within patient's tolerance;Monitored during session;Repositioned    Home Living Family/patient expects to be discharged to:: Unsure Living Arrangements: Other (Comment)(was previously in a hotel with a couple but cannot return )               Additional Comments: pt was homeless PTA    Prior Function Level of Independence: Independent with assistive device(s)(walking on SPC at times, shoeless on L foot)         Comments: has not been using footwear for LLE     Hand Dominance   Dominant Hand: Right    Extremity/Trunk Assessment   Upper Extremity Assessment Upper Extremity Assessment: Overall WFL for tasks assessed    Lower Extremity  Assessment Lower Extremity Assessment: Generalized weakness;Difficult to assess due to impaired cognition    Cervical / Trunk Assessment Cervical / Trunk Assessment: Normal  Communication   Communication: No difficulties  Cognition Arousal/Alertness: Awake/alert Behavior During Therapy: Anxious Overall Cognitive Status: Within Functional Limits for tasks assessed                                 General Comments: pt is anxious about follow up plans, has been homeless      General Comments General comments (skin integrity, edema, etc.): pt is sensitive on LLE and foot but does not have shoe to support or wb information.  Cannot use R LE prosthesis as pt no longer has liner or  socks to don it    Exercises     Assessment/Plan    PT Assessment Patient needs continued PT services  PT Problem List Decreased range of motion;Decreased activity tolerance;Decreased balance;Decreased mobility;Decreased coordination;Decreased knowledge of use of DME;Decreased safety awareness;Decreased skin integrity;Pain       PT Treatment Interventions DME instruction;Gait training;Functional mobility training;Therapeutic activities;Therapeutic exercise;Balance training;Neuromuscular re-education;Patient/family education    PT Goals (Current goals can be found in the Care Plan section)  Acute Rehab PT Goals Patient Stated Goal: to be able to live alone PT Goal Formulation: With patient Time For Goal Achievement: 10/22/18 Potential to Achieve Goals: Fair    Frequency Min 2X/week   Barriers to discharge Decreased caregiver support has no plan for dc and no caregivers    Co-evaluation               AM-PAC PT "6 Clicks" Mobility  Outcome Measure Help needed turning from your back to your side while in a flat bed without using bedrails?: None Help needed moving from lying on your back to sitting on the side of a flat bed without using bedrails?: A Little Help needed moving to and from a bed to a chair (including a wheelchair)?: A Little Help needed standing up from a chair using your arms (e.g., wheelchair or bedside chair)?: Total Help needed to walk in hospital room?: Total Help needed climbing 3-5 steps with a railing? : Total 6 Click Score: 13    End of Session   Activity Tolerance: Patient tolerated treatment well;Patient limited by fatigue Patient left: in bed;with call bell/phone within reach;with bed alarm set;Other (comment)(pt asking about where he will live) Nurse Communication: Mobility status PT Visit Diagnosis: Difficulty in walking, not elsewhere classified (R26.2);Pain Pain - Right/Left: Left Pain - part of body: Leg;Ankle and joints of foot     Time: 2376-2831 PT Time Calculation (min) (ACUTE ONLY): 14 min   Charges:   PT Evaluation $PT Eval Moderate Complexity: 1 Mod         Ivar Drape 10/08/2018, 12:25 PM  Samul Dada, PT MS Acute Rehab Dept. Number: [facility] [phone] and [facility] [phone]

## 2018-10-09 ENCOUNTER — Inpatient Hospital Stay (HOSPITAL_COMMUNITY): Payer: Medicare Other

## 2018-10-09 DIAGNOSIS — Z89511 Acquired absence of right leg below knee: Secondary | ICD-10-CM

## 2018-10-09 DIAGNOSIS — R609 Edema, unspecified: Secondary | ICD-10-CM

## 2018-10-09 LAB — CBC WITH DIFFERENTIAL/PLATELET
Abs Immature Granulocytes: 0.03 10*3/uL (ref 0.00–0.07)
Basophils Absolute: 0 10*3/uL (ref 0.0–0.1)
Basophils Relative: 1 %
Eosinophils Absolute: 0.2 10*3/uL (ref 0.0–0.5)
Eosinophils Relative: 5 %
HEMATOCRIT: 26.3 % — AB (ref 39.0–52.0)
Hemoglobin: 9.3 g/dL — ABNORMAL LOW (ref 13.0–17.0)
Immature Granulocytes: 1 %
Lymphocytes Relative: 33 %
Lymphs Abs: 1.5 10*3/uL (ref 0.7–4.0)
MCH: 32.7 pg (ref 26.0–34.0)
MCHC: 35.4 g/dL (ref 30.0–36.0)
MCV: 92.6 fL (ref 80.0–100.0)
Monocytes Absolute: 0.4 10*3/uL (ref 0.1–1.0)
Monocytes Relative: 8 %
NEUTROS PCT: 52 %
Neutro Abs: 2.4 10*3/uL (ref 1.7–7.7)
Platelets: 113 10*3/uL — ABNORMAL LOW (ref 150–400)
RBC: 2.84 MIL/uL — ABNORMAL LOW (ref 4.22–5.81)
RDW: 13.6 % (ref 11.5–15.5)
WBC: 4.5 10*3/uL (ref 4.0–10.5)
nRBC: 0 % (ref 0.0–0.2)

## 2018-10-09 LAB — CULTURE, BLOOD (ROUTINE X 2)
Culture: NO GROWTH
Special Requests: ADEQUATE

## 2018-10-09 LAB — MAGNESIUM: Magnesium: 1.6 mg/dL — ABNORMAL LOW (ref 1.7–2.4)

## 2018-10-09 LAB — BASIC METABOLIC PANEL
Anion gap: 8 (ref 5–15)
BUN: 8 mg/dL (ref 6–20)
CO2: 23 mmol/L (ref 22–32)
Calcium: 8.5 mg/dL — ABNORMAL LOW (ref 8.9–10.3)
Chloride: 107 mmol/L (ref 98–111)
Creatinine, Ser: 0.74 mg/dL (ref 0.61–1.24)
GFR calc Af Amer: >60 mL/min (ref >60)
GFR calc non Af Amer: >60 mL/min (ref >60)
GLUCOSE: 186 mg/dL — AB (ref 70–99)
Potassium: 3.9 mmol/L (ref 3.5–5.1)
Sodium: 138 mmol/L (ref 135–145)

## 2018-10-09 LAB — GLUCOSE, CAPILLARY
GLUCOSE-CAPILLARY: 122 mg/dL — AB (ref 70–99)
Glucose-Capillary: 122 mg/dL — ABNORMAL HIGH (ref 70–99)
Glucose-Capillary: 150 mg/dL — ABNORMAL HIGH (ref 70–99)

## 2018-10-09 MED ORDER — MAGNESIUM SULFATE 4 GM/100ML IV SOLN
4.0000 g | Freq: Once | INTRAVENOUS | Status: AC
  Administered 2018-10-09: 4 g via INTRAVENOUS
  Filled 2018-10-09: qty 100

## 2018-10-09 MED ORDER — ENOXAPARIN SODIUM 60 MG/0.6ML ~~LOC~~ SOLN
50.0000 mg | SUBCUTANEOUS | Status: DC
  Administered 2018-10-09 – 2018-10-13 (×5): 50 mg via SUBCUTANEOUS
  Filled 2018-10-09 (×7): qty 0.6

## 2018-10-09 NOTE — Progress Notes (Signed)
Orthopedic Tech Progress Note Patient Details:  Cory Palmer [birth year] [id number]  Ortho Devices Type of Ortho Device: Postop shoe/boot Ortho Device/Splint Interventions: Application   Post Interventions Patient Tolerated: Well Instructions Provided: Care of device   Saul Fordyce 10/09/2018, 9:30 AM

## 2018-10-09 NOTE — Progress Notes (Signed)
Lower extremity venous has been completed.   Preliminary results in CV Proc.   Blanch Media 10/09/2018 1:35 PM

## 2018-10-09 NOTE — Progress Notes (Signed)
PROGRESS NOTE  Cory Palmer:[id number] DOB: 1961-07-03 DOA: 10/04/2018 PCP: Patient, [name] Per  Brief history:  He presented to the ED due to multiple complaints, reports fever at home, having chest pain,  Associated symptoms were dyspnea, diaphoresis, palpitations, abdominal pain, nausea and vomiting,  He is found to have bacteremia, new onset of afib Improving, awaiting for SNF placement     HPI/Recap of past 24 hours:  He denies suicidal ideation He denies chest pain, no sob, he is on room air at rest Left foot wrapped, no odor, no overt bleeding no fever,   He reports friends won't bring in his belongs from the motel he previously stayed, he called [place] police  Reports being constipated    Assessment/Plan: Principal Problem:   Sepsis without acute organ dysfunction (HCC) Active Problems:   Homelessness   Diabetes mellitus type 2, noninsulin dependent (HCC)   S/P transmetatarsal amputation of foot, left (HCC)   Depressed bipolar disorder (HCC)   Hypertension   Hyperbilirubinemia   Bacteremia due to Gram-positive bacteria   Hypokalemia   Hypomagnesemia   PAF (paroxysmal atrial fibrillation) (HCC)    Sepsis, mssa and strep bacteremia, likely source from foot -TTE done on 3/6 no vegetation  -Repeat blood culture obtained on 3/7 , no growth so far -he is started on vanc/cefepime/flagyl on admission, abx changed to ancef after culture grew mssa and strep, ID input appreciated, continue iv ancef for now, change to keflex at discharge, total abx treatment two weeks (starting date from 3/7)  S/p transmetatarsal amputation of foot, left Will get venous US left leg to r/o DVT due to edema on exam Wound care  Dr [name] consulted, input appreciated  Afib with intermittent short pauses/bradycardia, does not appear to have symptom (likely new diagnosis) -continue tele -tsh unremarkable, keep k>4, mag>2 -he has anemia and thrombocytopenia, surgical wound  bleeding (left foot) prior to coming to the hospital, not a good candidate for anticoagulation -Echo with adequate ef, bp stable - cardiology consulted, input appreciated. Detail please see consult note from 3/9, patient is to follow up with cardiology in 2-4 weeks outpatient.  Normocytic anemia,Chronic thrombocytopenia  b12 low normal ,started on  b12 supplement Monitor He reports intermittent nose bleed, No overt sign of bleeding since in the hospital, FOBT pending collection  Hypokalemia/hypomagnesemia Remain low, continue to Replace k/mag, recheck in am  noninsulin dependent diabetes with hyperglycemia A1c6.6 Home meds metformin held On ssi here  Bipolar /depression/ SI seroquel /trazodone  He reports SI on 3/6, Psychiatry consulted, he is deemed to be safe, sitter discontinued per psychiatry recommendation He currently denies SI  Homeless /Polysubstance abuse (cocaine) HIV/hepatitis negative Social worker consult, difficult placement   he reports was at [place] detox facility for 8-9 months prior to relocating to [place] 4months ago, he needs to have pcp, he needs housing. He reports lives in a motel with a drug dealer prior to this hospitalization    Code Status: full  Family Communication: patient   Disposition Plan: SNF placement when bed is available Difficult placement per Child psychotherapist  Consultants:  Psych  ID  Cards  ortho  Procedures:  none  Antibiotics:  Vanc/flagyl/cefepime x1 on admission  Ancef from 3/6   Objective: BP 98/67   Pulse 75   Temp 97.7 F (36.5 C) (Oral)   Resp 17   Ht  (1.778 m)   Wt 104.5 kg   SpO2 97%   BMI 33.06 kg/m   Intake/Output  Summary (Last 24 hours) at 10/09/2018 1007 Last data filed at 10/09/2018 0900 Gross per 24 hour  Intake 600 ml  Output 3920 ml  Net -3320 ml   Filed Weights   10/07/18 0332 10/08/18 0302 10/09/18 0700  Weight: 109 kg 109.7 kg 104.5 kg    Exam: Patient is examined  daily including today on 10/09/2018, exams remain the same as of yesterday except that has changed    General:  NAD  Cardiovascular: IRRR  Respiratory: CTABL  Abdomen: Soft/ND/NT, positive BS  Musculoskeletal: left leg Edema, left foot amputation, dressing in place , right leg s/p BKA  Neuro: alert, oriented   Data Reviewed: Basic Metabolic Panel: Recent Labs  Lab 10/05/18 0618 10/06/18 0237 10/07/18 0830 10/08/18 0508 10/09/18 0341  NA 136 136 135 139 138  K 2.9* 3.4* 3.8 3.5 3.9  CL 106 109 110 111 107  CO2 19* 19* 18* 21* 23  GLUCOSE 169* 125* 216* 163* 186*  BUN CREATININE 0.95 0.73 0.78 0.78 0.74  CALCIUM 8.2* 7.8* 7.8* 8.0* 8.5*  MG  --  1.6* 1.5* 1.8 1.6*   Liver Function Tests: Recent Labs  Lab 10/04/18 2231 10/05/18 0618 10/08/18 0508  AST ALT ALKPHOS 112 90 89  BILITOT 5.2* 3.0* 0.8  PROT 7.2 6.2* 5.2*  ALBUMIN 3.7 3.1* 2.4*   Recent Labs  Lab 10/04/18 2231  LIPASE 21   No results for input(s): AMMONIA in the last 168 hours. CBC: Recent Labs  Lab 10/05/18 0618 10/06/18 0237 10/07/18 0830 10/08/18 0508 10/09/18 0341  WBC 17.1* 5.6 4.1 3.7* 4.5  NEUTROABS 12.5* 3.7 2.4 2.0 2.4  HGB 10.4* 8.6* 8.6* 8.9* 9.3*  HCT 30.9* 24.9* 25.2* 26.7* 26.3*  MCV 93.9 94.0 94.4 93.7 92.6  PLT 138* 87* 90* 99* 113*   Cardiac Enzymes:   Recent Labs  Lab 10/05/18 0618 10/05/18 1207  TROPONINI <0.03 <0.03   BNP (last 3 results) No results for input(s): BNP in the last 8760 hours.  ProBNP (last 3 results) No results for input(s): PROBNP in the last 8760 hours.  CBG: Recent Labs  Lab 10/08/18 0743 10/08/18 1145 10/08/18 1638 10/08/18 2227 10/09/18 0753  GLUCAP 175* 115* 108* 154* 122*    Recent Results (from the past 240 hour(s))  Blood Culture (routine x 2)     Status: Abnormal   Collection Time: 10/04/18 10:34 PM  Result Value Ref Range Status   Specimen Description BLOOD LEFT HAND  Final   Special  Requests   Final    BOTTLES DRAWN AEROBIC AND ANAEROBIC Blood Culture adequate volume   Culture  Setup Time   Final    GRAM POSITIVE COCCI AEROBIC BOTTLE ONLY CRITICAL RESULT CALLED TO, READ BACK BY AND VERIFIED WITH: PHRMD M PHAM  10/05/2018 BY S GEZAHEGN Performed at [facility], [address], [place], [place] [address]    Culture STAPHYLOCOCCUS AUREUS STREPTOCOCCUS GROUP G  (A)  Final   Report Status 10/09/2018 FINAL  Final   Organism ID, Bacteria STAPHYLOCOCCUS AUREUS  Final   Organism ID, Bacteria STREPTOCOCCUS GROUP G  Final      Susceptibility   Staphylococcus aureus - MIC*    CIPROFLOXACIN <=0.5 SENSITIVE Sensitive     ERYTHROMYCIN >=8 RESISTANT Resistant     GENTAMICIN <=0.5 SENSITIVE Sensitive     OXACILLIN 0.5 SENSITIVE Sensitive     TETRACYCLINE <=1 SENSITIVE Sensitive  VANCOMYCIN <=0.5 SENSITIVE Sensitive     TRIMETH/SULFA <=10 SENSITIVE Sensitive     CLINDAMYCIN >=8 RESISTANT Resistant     RIFAMPIN <=0.5 SENSITIVE Sensitive     Inducible Clindamycin NEGATIVE Sensitive     * STAPHYLOCOCCUS AUREUS   Streptococcus group g - MIC*    CLINDAMYCIN >=1 RESISTANT Resistant     AMPICILLIN <=0.25 SENSITIVE Sensitive     ERYTHROMYCIN >=8 RESISTANT Resistant     VANCOMYCIN 0.25 SENSITIVE Sensitive     CEFTRIAXONE <=0.12 SENSITIVE Sensitive     LEVOFLOXACIN 0.5 SENSITIVE Sensitive     * STREPTOCOCCUS GROUP G  Blood Culture ID Panel (Reflexed)     Status: Abnormal   Collection Time: 10/04/18 10:34 PM  Result Value Ref Range Status   Enterococcus species NOT DETECTED NOT DETECTED Final   Listeria monocytogenes NOT DETECTED NOT DETECTED Final   Staphylococcus species DETECTED (A) NOT DETECTED Final    Comment: CRITICAL RESULT CALLED TO, READ BACK BY AND VERIFIED WITH: PHRMD M PHAM @1356  10/05/2018 BY S GEZAHEGN    Staphylococcus aureus (BCID) DETECTED (A) NOT DETECTED Final    Comment: Methicillin (oxacillin) susceptible Staphylococcus aureus (MSSA). Preferred  therapy is anti staphylococcal beta lactam antibiotic (Cefazolin or Nafcillin), unless clinically contraindicated. CRITICAL RESULT CALLED TO, READ BACK BY AND VERIFIED WITH: PHRMD M PHAM @1356  10/05/2018 BY S GEZAHEGN    Methicillin resistance NOT DETECTED NOT DETECTED Final   Streptococcus species DETECTED (A) NOT DETECTED Final    Comment: Not Enterococcus species, Streptococcus agalactiae, Streptococcus pyogenes, or Streptococcus pneumoniae. CRITICAL RESULT CALLED TO, READ BACK BY AND VERIFIED WITH: PHRMD M PHAM @1356  10/05/2018 BY S GEZAHEGN    Streptococcus agalactiae NOT DETECTED NOT DETECTED Final   Streptococcus pneumoniae NOT DETECTED NOT DETECTED Final   Streptococcus pyogenes NOT DETECTED NOT DETECTED Final   Acinetobacter baumannii NOT DETECTED NOT DETECTED Final   Enterobacteriaceae species NOT DETECTED NOT DETECTED Final   Enterobacter cloacae complex NOT DETECTED NOT DETECTED Final   Escherichia coli NOT DETECTED NOT DETECTED Final   Klebsiella oxytoca NOT DETECTED NOT DETECTED Final   Klebsiella pneumoniae NOT DETECTED NOT DETECTED Final   Proteus species NOT DETECTED NOT DETECTED Final   Serratia marcescens NOT DETECTED NOT DETECTED Final   Haemophilus influenzae NOT DETECTED NOT DETECTED Final   Neisseria meningitidis NOT DETECTED NOT DETECTED Final   Pseudomonas aeruginosa NOT DETECTED NOT DETECTED Final   Candida albicans NOT DETECTED NOT DETECTED Final   Candida glabrata NOT DETECTED NOT DETECTED Final   Candida krusei NOT DETECTED NOT DETECTED Final   Candida parapsilosis NOT DETECTED NOT DETECTED Final   Candida tropicalis NOT DETECTED NOT DETECTED Final    Comment: Performed at [facility], [address] [place], [place] [address]  Blood Culture (routine x 2)     Status: None   Collection Time: 10/04/18 10:35 PM  Result Value Ref Range Status   Specimen Description BLOOD LEFT ANTECUBITAL  Final   Special Requests   Final    BOTTLES DRAWN AEROBIC ONLY  Blood Culture results may not be optimal due to an inadequate volume of blood received in culture bottles   Culture   Final    NO GROWTH 5 DAYS Performed at [facility], [address] [place], [place] [address]    Report Status 10/09/2018 FINAL  Final  Culture, blood (routine x 2)     Status: None (Preliminary result)   Collection Time: 10/06/18  2:37 AM  Result  Value Ref Range Status   Specimen Description BLOOD RIGHT ANTECUBITAL  Final   Special Requests   Final    BOTTLES DRAWN AEROBIC ONLY Blood Culture results may not be optimal due to an inadequate volume of blood received in culture bottles   Culture   Final    NO GROWTH 3 DAYS Performed at [facility], [address], [place], [place] [address]    Report Status PENDING  Incomplete  Culture, blood (routine x 2)     Status: None (Preliminary result)   Collection Time: 10/06/18  2:37 AM  Result Value Ref Range Status   Specimen Description BLOOD RIGHT ANTECUBITAL  Final   Special Requests   Final    BOTTLES DRAWN AEROBIC ONLY Blood Culture results may not be optimal due to an inadequate volume of blood received in culture bottles   Culture   Final    NO GROWTH 3 DAYS Performed at [facility], [address], [place], [place] [address]    Report Status PENDING  Incomplete     Studies: No results found.  Scheduled Meds: . enoxaparin (LOVENOX) injection  50 mg Subcutaneous Q24H  . insulin aspart  0-20 Units Subcutaneous TID WC  . nicotine  14 mg Transdermal Daily  . polyethylene glycol  17 g Oral BID  . QUEtiapine  400 mg Oral QHS  . senna-docusate  1 tablet Oral BID  . tamsulosin  0.4 mg Oral QPC supper  . traZODone  100 mg Oral QHS  . vitamin B-12  1,000 mcg Oral Daily    Continuous Infusions: .  ceFAZolin (ANCEF) IV 2 g (10/09/18 0549)  . magnesium sulfate 1 - 4 g bolus IVPB 4 g (10/09/18 1002)     Time spent: , case discussed with  ortho I have personally reviewed and interpreted  on  10/09/2018 daily labs, tele strips, imagings as discussed above under date review session and assessment and plans.  I reviewed all nursing notes, pharmacy notes, consultant notes,  vitals, pertinent old records  I have discussed plan of care as described above with RN , patient on 10/09/2018   Albertine Grates MD, PhD  [facility] Pager [phone]. If 7PM-7AM, please contact night-coverage at [email or web address], password [facility] 10/09/2018, 10:07 AM  LOS: 4 days

## 2018-10-09 NOTE — Progress Notes (Signed)
Patient has no current SNF offers due to past experiences in various SNFs. CSW will continue search.   Osborne Casco Ramie Palladino LCSW [phone]

## 2018-10-09 NOTE — Progress Notes (Signed)
Orthopedic Tech Progress Note Patient Details:  Cory Palmer 03/17/1961 [id number] Placed order with [facility]. Patient ID: Cory Palmer, male   DOB: October 02, 1960, 58 y.o.   MRN: [id number]   [name] 10/09/2018, 12:34 PM

## 2018-10-09 NOTE — Progress Notes (Signed)
Patient ID: Cory Palmer, male   DOB: 1960/11/21, 57 y.o.   MRN: [id number] Patient has a right transtibial amputation and left transmetatarsal amputation.  Patient was discharged has been full weightbearing on the left foot.  Examination comparison of patient's foot today to the initial presentation the maceration has resolved significantly.  There is a small amount of serosanguineous drainage most likely from resolving hematoma.  No cellulitis no abscess.  Discussed with the patient the importance of being strict nonweightbearing on the left foot with dry dressing changes daily.  Patient will need 2 weeks of oral antibiotics at discharge.  I will follow-up next week in the office as an outpatient.

## 2018-10-10 LAB — CBC WITH DIFFERENTIAL/PLATELET
Abs Immature Granulocytes: 0.04 10*3/uL (ref 0.00–0.07)
Basophils Absolute: 0 10*3/uL (ref 0.0–0.1)
Basophils Relative: 1 %
Eosinophils Absolute: 0.3 10*3/uL (ref 0.0–0.5)
Eosinophils Relative: 5 %
HCT: 29.5 % — ABNORMAL LOW (ref 39.0–52.0)
Hemoglobin: 10 g/dL — ABNORMAL LOW (ref 13.0–17.0)
Immature Granulocytes: 1 %
LYMPHS PCT: 27 %
Lymphs Abs: 1.6 10*3/uL (ref 0.7–4.0)
MCH: 31.8 pg (ref 26.0–34.0)
MCHC: 33.9 g/dL (ref 30.0–36.0)
MCV: 93.9 fL (ref 80.0–100.0)
Monocytes Absolute: 0.5 10*3/uL (ref 0.1–1.0)
Monocytes Relative: 8 %
Neutro Abs: 3.6 10*3/uL (ref 1.7–7.7)
Neutrophils Relative %: 58 %
Platelets: 122 10*3/uL — ABNORMAL LOW (ref 150–400)
RBC: 3.14 MIL/uL — ABNORMAL LOW (ref 4.22–5.81)
RDW: 14.2 % (ref 11.5–15.5)
WBC: 6.1 10*3/uL (ref 4.0–10.5)
nRBC: 0 % (ref 0.0–0.2)

## 2018-10-10 LAB — BASIC METABOLIC PANEL
Anion gap: 8 (ref 5–15)
BUN: 9 mg/dL (ref 6–20)
CHLORIDE: 108 mmol/L (ref 98–111)
CO2: 21 mmol/L — ABNORMAL LOW (ref 22–32)
CREATININE: 0.75 mg/dL (ref 0.61–1.24)
Calcium: 8.8 mg/dL — ABNORMAL LOW (ref 8.9–10.3)
GFR calc Af Amer: >60 mL/min (ref >60)
GFR calc non Af Amer: >60 mL/min (ref >60)
Glucose, Bld: 124 mg/dL — ABNORMAL HIGH (ref 70–99)
Potassium: 4 mmol/L (ref 3.5–5.1)
SODIUM: 137 mmol/L (ref 135–145)

## 2018-10-10 LAB — GLUCOSE, CAPILLARY
GLUCOSE-CAPILLARY: 142 mg/dL — AB (ref 70–99)
Glucose-Capillary: 112 mg/dL — ABNORMAL HIGH (ref 70–99)
Glucose-Capillary: 129 mg/dL — ABNORMAL HIGH (ref 70–99)
Glucose-Capillary: 118 mg/dL — ABNORMAL HIGH (ref 70–99)

## 2018-10-10 LAB — MAGNESIUM: MAGNESIUM: 1.8 mg/dL (ref 1.7–2.4)

## 2018-10-10 NOTE — Progress Notes (Addendum)
PROGRESS NOTE        PATIENT DETAILS Name: Cory Palmer Age: 58 y.o. Sex: male Date of Birth: 1960/09/05 Admit Date: 10/04/2018 Admitting Physician Bobette Mo, MD ZOX:[id number], [name] Per  Brief Narrative: Patient is a 58 y.o. 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 Associated symptoms include shortness of breath, heart palpitations, abdominal pain, nausea, and vomiting. Reported subjective fevers. In ED, troponins negative. Initial lactate 2.6, leukocytosis at 19.7. X-ray of left root negative for osteomyelitis. Patient was started on empiric iv antibiotics and admitted for sepsis, most likely secondary to left foot surgical site.   Subjective: Patient resting comfortably in bed, reports improvement in symptoms. Endorses continued pain of left foot. He denies chest pain, shortness of breath, nausea, and vomiting.   Assessment/Plan: Principal Problem:   Sepsis without acute organ dysfunction (HCC) Active Problems:   Homelessness   Diabetes mellitus type 2, noninsulin dependent (HCC)   S/P transmetatarsal amputation of foot, left (HCC)   Depressed bipolar disorder (HCC)   Hypertension   Hyperbilirubinemia   Bacteremia due to Gram-positive bacteria   Hypokalemia   Hypomagnesemia   PAF (paroxysmal atrial fibrillation) (HCC)   Hx of BKA, right (HCC)  1. Sepsis, MSSA, and strep bacteremia, most likely secondary to left foot soft tissue infection  -Echo performed 3/6, no noted vegetation  -Blood cultures on 3/5 with MSSA and group G streptococcus. Repeat cultures without growth. Patient remains afebrile, hemodynamically stable, without leukocytosis. Will continue iv cefazolin and transition to po  keflex at discharge per ID.   2. Left foot status post transmetatarsal amputation -Venous US negative for DVT bilaterally  -Ortho tech, wound care consulted. PT evaluation, recommendation for SNF  -Patient to follow-up as outpatient   3. Type 2 diabetes -Stable, CBG 129 this am. Continue SSI with novolog   4. Paroxysmal atrial fibrillation -Chest x-ray with mild cardiomegaly, Echo with EF of 60-65%, EKG with no ischemic changes. Tele reviewed today which shows Afib with HR in 60s, rate controlled without medication. He is not currently on anticoagulation- not a good candidate given persistent anemia and thrombocytopenia. Cardiology consulted, patient to follow-up on outpatient basis in 2-4 weeks.   5. Normocytic anemia, chronic thrombocytopenia -Hgb improved from 8.6 to 10. Platelets improved to 122. Will continue to monitor   7. Hypomagnesemia, Hypokalemia   -Stable, level improved from 1.6 to 1.8 with iv Mag sulfate. K improved, stable at 4.0 with po KCl    8. Type 2 diabetes  -Patient on metformin at home. CBG 112 this am. Will continue with SSI, hold metformin  9. Depressed bipolar disorder -Psychiatry consulted, supportive therapy provided  -Patient has inconsistently been on Seroquel , trazodone as outpatient, will continue   Telemetry (independently reviewed): atrial fibrillation with HR in 60s  Echo (reviewed):EF 60-65% on TTE done on 10/05/18  Old records review: outpatient records  Morning labs/Imaging ordered: yes  DVT Prophylaxis: Prophylactic Lovenox   Code Status: Full code   Family Communication: None  Disposition Plan: Remain inpatient pending clinical improvement   Antimicrobial agents: Anti-infectives (From admission, onward)   Start     Dose/Rate Route Frequency Ordered  Stop   10/05/18 1430  ceFAZolin (ANCEF) IVPB 2g/100 mL premix     2 g 200 mL/hr over 30 Minutes Intravenous Every 8 hours 10/05/18 1422     10/05/18 1000  vancomycin (VANCOCIN)  1,500 mg in sodium chloride 0.9 % 500 mL IVPB  Status:  Discontinued     1,500 mg 250 mL/hr over 120 Minutes Intravenous Every 12 hours 10/04/18 2325 10/05/18 1421   10/05/18 0800  ceFEPIme (MAXIPIME) 1 g in sodium chloride 0.9 % 100 mL IVPB  Status:  Discontinued     1 g 200 mL/hr over 30 Minutes Intravenous Every 8 hours 10/04/18 2325 10/05/18 1421   10/04/18 2330  vancomycin (VANCOCIN) 2,000 mg in sodium chloride 0.9 % 500 mL IVPB     2,000 mg 250 mL/hr over 120 Minutes Intravenous  Once 10/04/18 2315 10/05/18 0144   10/04/18 2315  ceFEPIme (MAXIPIME) 2 g in sodium chloride 0.9 % 100 mL IVPB     2 g 200 mL/hr over 30 Minutes Intravenous  Once 10/04/18 2311 10/05/18 0020   10/04/18 2315  metroNIDAZOLE (FLAGYL) IVPB 500 mg  Status:  Discontinued     500 mg 100 mL/hr over 60 Minutes Intravenous Every 8 hours 10/04/18 2311 10/05/18 1421   10/04/18 2315  vancomycin (VANCOCIN) IVPB 1000 mg/200 mL premix  Status:  Discontinued     1,000 mg 200 mL/hr over 60 Minutes Intravenous  Once 10/04/18 2311 10/04/18 2315     Procedures: Echo  CONSULTS: Psychiatry Cardiology Orthopedics  MEDICATIONS: Scheduled Meds:  enoxaparin (LOVENOX) injection  50 mg Subcutaneous Q24H   insulin aspart  0-20 Units Subcutaneous TID WC   nicotine  14 mg Transdermal Daily   polyethylene glycol  17 g Oral BID   QUEtiapine  400 mg Oral QHS   senna-docusate  1 tablet Oral BID   tamsulosin  0.4 mg Oral QPC supper   traZODone  100 mg Oral QHS   vitamin B-12  1,000 mcg Oral Daily   Continuous Infusions:   ceFAZolin (ANCEF) IV 2 g (10/10/18 0522)   PRN Meds:.acetaminophen, nicotine, ondansetron (ZOFRAN) IV   PHYSICAL EXAM: Vital signs: Vitals:   10/10/18 0021 10/10/18 0439 10/10/18 0650 10/10/18 0736  BP: 102/78  100/62   Pulse: 63  60   Resp: (!) 21  18   Temp: 98.5 F (36.9 C) 98.3 F (36.8 C)  97.7 F (36.5 C)  TempSrc:    Oral  SpO2: 99%  95%   Weight:      Height:       Filed  Weights   10/07/18 0332 10/08/18 0302 10/09/18 0700  Weight: 109 kg 109.7 kg 104.5 kg   Body mass index is 33.06 kg/m.   General: Awake, alert, not in any distress. Appears deconditioned  HEENT: Atraumatic and normocephalic CV: Irregular rhythm   Resp: Good air entry bilaterally GI: Non tender and not distended with no guarding Extremities: Mild left lower extremity edema. Right leg s/p BKA. Neurology: Speech clear, sensation grossly intact, moves all four extremities Musculoskeletal: Normal ROM Skin: No rash, left foot dressing in place   LABORATORY DATA: CBC: Recent Labs  Lab 10/06/18 0237 10/07/18 0830 10/08/18 0508 10/09/18 0341 10/10/18 0742  WBC 5.6 4.1 3.7* 4.5 6.1  NEUTROABS 3.7 2.4 2.0 2.4 3.6  HGB 8.6* 8.6* 8.9* 9.3* 10.0*  HCT 24.9* 25.2* 26.7* 26.3* 29.5*  MCV 94.0 94.4 93.7 92.6 93.9  PLT 87* 90* 99* 113* 122*    Basic Metabolic Panel:  Recent Labs  Lab 10/06/18 0237 10/07/18 0830 10/08/18 0508 10/09/18 0341 10/10/18 0742  NA 136 135 139 138 137  K 3.4* 3.8 3.5 3.9 4.0  CL 109 110 111 107 108  CO2 19* 18* 21* 23 21*  GLUCOSE 125* 216* 163* 186* 124*  BUN CREATININE 0.73 0.78 0.78 0.74 0.75  CALCIUM 7.8* 7.8* 8.0* 8.5* 8.8*  MG 1.6* 1.5* 1.8 1.6* 1.8    GFR: Estimated Creatinine Clearance: 121.9 mL/min (by C-G formula based on SCr of 0.75 mg/dL).  Liver Function Tests: Recent Labs  Lab 10/04/18 2231 10/05/18 0618 10/08/18 0508  AST ALT ALKPHOS 112 90 89  BILITOT 5.2* 3.0* 0.8  PROT 7.2 6.2* 5.2*  ALBUMIN 3.7 3.1* 2.4*   Recent Labs  Lab 10/04/18 2231  LIPASE 21   No results for input(s): AMMONIA in the last 168 hours.  Coagulation Profile: No results for input(s): INR, PROTIME in the last 168 hours.  Cardiac Enzymes: Recent Labs  Lab 10/05/18 0618 10/05/18 1207  TROPONINI <0.03 <0.03    BNP (last 3 results) No results for input(s): PROBNP in the last 8760 hours.  HbA1C: No results for  input(s): HGBA1C in the last 72 hours.  CBG: Recent Labs  Lab 10/08/18 2227 10/09/18 0753 10/09/18 1139 10/09/18 1648 10/10/18 0735  GLUCAP 154* 122* 122* 150* 112*    Lipid Profile: No results for input(s): CHOL, HDL, LDLCALC, TRIG, CHOLHDL, LDLDIRECT in the last 72 hours.  Thyroid Function Tests: No results for input(s): TSH, T4TOTAL, FREET4, T3FREE, THYROIDAB in the last 72 hours.  Anemia Panel: No results for input(s): VITAMINB12, FOLATE, FERRITIN, TIBC, IRON, RETICCTPCT in the last 72 hours.  Urine analysis:    Component Value Date/Time   COLORURINE AMBER (A) 10/05/2018 1153   APPEARANCEUR CLEAR 10/05/2018 1153   LABSPEC 1.034 (H) 10/05/2018 1153   PHURINE 5.0 10/05/2018 1153   GLUCOSEU NEGATIVE 10/05/2018 1153   HGBUR NEGATIVE 10/05/2018 1153   BILIRUBINUR SMALL (A) 10/05/2018 1153   KETONESUR 5 (A) 10/05/2018 1153   PROTEINUR 30 (A) 10/05/2018 1153   NITRITE NEGATIVE 10/05/2018 1153   LEUKOCYTESUR NEGATIVE 10/05/2018 1153    Sepsis Labs: Lactic Acid, Venous    Component Value Date/Time   LATICACIDVEN 1.4 10/05/2018 0618    MICROBIOLOGY: Recent Results (from the past 240 hour(s))  Blood Culture (routine x 2)     Status: Abnormal   Collection Time: 10/04/18 10:34 PM  Result Value Ref Range Status   Specimen Description BLOOD LEFT HAND  Final   Special Requests   Final    BOTTLES DRAWN AEROBIC AND ANAEROBIC Blood Culture adequate volume   Culture  Setup Time   Final    GRAM POSITIVE COCCI AEROBIC BOTTLE ONLY CRITICAL RESULT CALLED TO, READ BACK BY AND VERIFIED WITH: PHRMD M PHAM  10/05/2018 BY S GEZAHEGN Performed at [facility], [address], [place], [place] [address]    Culture STAPHYLOCOCCUS AUREUS STREPTOCOCCUS GROUP G  (A)  Final   Report Status 10/09/2018 FINAL  Final   Organism ID, Bacteria STAPHYLOCOCCUS AUREUS  Final   Organism ID, Bacteria STREPTOCOCCUS GROUP G  Final      Susceptibility   Staphylococcus aureus - MIC*     CIPROFLOXACIN <=0.5 SENSITIVE Sensitive     ERYTHROMYCIN >=8 RESISTANT Resistant     GENTAMICIN <=0.5 SENSITIVE Sensitive     OXACILLIN 0.5 SENSITIVE Sensitive  TETRACYCLINE <=1 SENSITIVE Sensitive     VANCOMYCIN <=0.5 SENSITIVE Sensitive     TRIMETH/SULFA <=10 SENSITIVE Sensitive     CLINDAMYCIN >=8 RESISTANT Resistant     RIFAMPIN <=0.5 SENSITIVE Sensitive     Inducible Clindamycin NEGATIVE Sensitive     * STAPHYLOCOCCUS AUREUS   Streptococcus group g - MIC*    CLINDAMYCIN >=1 RESISTANT Resistant     AMPICILLIN <=0.25 SENSITIVE Sensitive     ERYTHROMYCIN >=8 RESISTANT Resistant     VANCOMYCIN 0.25 SENSITIVE Sensitive     CEFTRIAXONE <=0.12 SENSITIVE Sensitive     LEVOFLOXACIN 0.5 SENSITIVE Sensitive     * STREPTOCOCCUS GROUP G  Blood Culture ID Panel (Reflexed)     Status: Abnormal   Collection Time: 10/04/18 10:34 PM  Result Value Ref Range Status   Enterococcus species NOT DETECTED NOT DETECTED Final   Listeria monocytogenes NOT DETECTED NOT DETECTED Final   Staphylococcus species DETECTED (A) NOT DETECTED Final    Comment: CRITICAL RESULT CALLED TO, READ BACK BY AND VERIFIED WITH: PHRMD M PHAM @1356  10/05/2018 BY S GEZAHEGN    Staphylococcus aureus (BCID) DETECTED (A) NOT DETECTED Final    Comment: Methicillin (oxacillin) susceptible Staphylococcus aureus (MSSA). Preferred therapy is anti staphylococcal beta lactam antibiotic (Cefazolin or Nafcillin), unless clinically contraindicated. CRITICAL RESULT CALLED TO, READ BACK BY AND VERIFIED WITH: PHRMD M PHAM @1356  10/05/2018 BY S GEZAHEGN    Methicillin resistance NOT DETECTED NOT DETECTED Final   Streptococcus species DETECTED (A) NOT DETECTED Final    Comment: Not Enterococcus species, Streptococcus agalactiae, Streptococcus pyogenes, or Streptococcus pneumoniae. CRITICAL RESULT CALLED TO, READ BACK BY AND VERIFIED WITH: PHRMD M PHAM @1356  10/05/2018 BY S GEZAHEGN    Streptococcus agalactiae NOT DETECTED NOT DETECTED Final    Streptococcus pneumoniae NOT DETECTED NOT DETECTED Final   Streptococcus pyogenes NOT DETECTED NOT DETECTED Final   Acinetobacter baumannii NOT DETECTED NOT DETECTED Final   Enterobacteriaceae species NOT DETECTED NOT DETECTED Final   Enterobacter cloacae complex NOT DETECTED NOT DETECTED Final   Escherichia coli NOT DETECTED NOT DETECTED Final   Klebsiella oxytoca NOT DETECTED NOT DETECTED Final   Klebsiella pneumoniae NOT DETECTED NOT DETECTED Final   Proteus species NOT DETECTED NOT DETECTED Final   Serratia marcescens NOT DETECTED NOT DETECTED Final   Haemophilus influenzae NOT DETECTED NOT DETECTED Final   Neisseria meningitidis NOT DETECTED NOT DETECTED Final   Pseudomonas aeruginosa NOT DETECTED NOT DETECTED Final   Candida albicans NOT DETECTED NOT DETECTED Final   Candida glabrata NOT DETECTED NOT DETECTED Final   Candida krusei NOT DETECTED NOT DETECTED Final   Candida parapsilosis NOT DETECTED NOT DETECTED Final   Candida tropicalis NOT DETECTED NOT DETECTED Final    Comment: Performed at [facility], [address] [place], [place] [address]  Blood Culture (routine x 2)     Status: None   Collection Time: 10/04/18 10:35 PM  Result Value Ref Range Status   Specimen Description BLOOD LEFT ANTECUBITAL  Final   Special Requests   Final    BOTTLES DRAWN AEROBIC ONLY Blood Culture results may not be optimal due to an inadequate volume of blood received in culture bottles   Culture   Final    NO GROWTH 5 DAYS Performed at [facility], [address] [place], [place] [address]    Report Status 10/09/2018 FINAL  Final  Culture, blood (routine x 2)     Status: None (Preliminary result)   Collection  Time: 10/06/18  2:37 AM  Result Value Ref Range Status   Specimen Description BLOOD RIGHT ANTECUBITAL  Final   Special Requests   Final    BOTTLES DRAWN AEROBIC ONLY Blood Culture results may not be optimal due to an inadequate volume of blood received in culture  bottles   Culture   Final    NO GROWTH 4 DAYS Performed at [facility], [address], [place], [place] [address]    Report Status PENDING  Incomplete  Culture, blood (routine x 2)     Status: None (Preliminary result)   Collection Time: 10/06/18  2:37 AM  Result Value Ref Range Status   Specimen Description BLOOD RIGHT ANTECUBITAL  Final   Special Requests   Final    BOTTLES DRAWN AEROBIC ONLY Blood Culture results may not be optimal due to an inadequate volume of blood received in culture bottles   Culture   Final    NO GROWTH 4 DAYS Performed at [facility], [address] [place], [place] [address]    Report Status PENDING  Incomplete    RADIOLOGY STUDIES/RESULTS: Dg Chest 2 View  Result Date: 10/04/2018 CLINICAL DATA:  Cough for 4 days EXAM: CHEST - 2 VIEW COMPARISON:  05/08/2018 FINDINGS: Mild cardiomegaly. No overt pulmonary edema. Both lungs are clear. The visualized skeletal structures are unremarkable. IMPRESSION: No active cardiopulmonary disease.  Mild cardiomegaly Electronically Signed   By: Jasmine Pang M.D.   On: 10/04/2018 22:52   Dg Foot Complete Left  Result Date: 10/05/2018 CLINICAL DATA:  Recent foot surgery possible wound infection. EXAM: LEFT FOOT - COMPLETE 3+ VIEW COMPARISON:  Preoperative radiograph 09/24/2018 FINDINGS: Post transmetatarsal amputation of foot. The resection margins are sharp. No periosteal reaction or bony destructive change. Three linear foreign bodies projecting over the foot are unchanged prior exam, likely in the soft tissues, less likely external to the patient given persistence on pre and postoperative imaging. No tracking soft tissue air. Overlying dressing in place. IMPRESSION: Post transmetatarsal amputation of the foot. No radiographic findings of osteomyelitis. Electronically Signed   By: Narda Rutherford M.D.   On: 10/05/2018 00:04   Xr Foot Complete Left  Result Date: 09/24/2018 3 view radiographs of the left foot  shows a previous ray amputation of the first second and third metatarsals.  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  There are multiple retained needles within the soft tissue.  Vas [place] Lower Extremity Venous (dvt)  Result Date: 10/09/2018  Lower Venous Study Indications: Edema.  Performing Technologist: Blanch Media RVS  Examination Guidelines: A complete evaluation includes B-mode imaging, spectral Doppler, color Doppler, and power Doppler as needed of all accessible portions of each vessel. Bilateral testing is considered an integral part of a complete examination. Limited examinations for reoccurring indications may be performed as noted.  Right Venous Findings: +---+---------------+---------+-----------+----------+-------+      Compressibility Phasicity Spontaneity Properties Summary  +---+---------------+---------+-----------+----------+-------+  CFV Full            Yes       Yes                             +---+---------------+---------+-----------+----------+-------+  Left Venous Findings: +---------+---------------+---------+-----------+----------+--------------+            Compressibility Phasicity Spontaneity Properties Summary         +---------+---------------+---------+-----------+----------+--------------+  CFV       Full            Yes       Yes                                    +---------+---------------+---------+-----------+----------+--------------+  SFJ       Full                                                             +---------+---------------+---------+-----------+----------+--------------+  FV Prox   Full                                                             +---------+---------------+---------+-----------+----------+--------------+  FV Mid    Full                                                             +---------+---------------+---------+-----------+----------+--------------+  FV Distal Full                                                              +---------+---------------+---------+-----------+----------+--------------+  PFV       Full                                                             +---------+---------------+---------+-----------+----------+--------------+  POP       Full            Yes       Yes                                    +---------+---------------+---------+-----------+----------+--------------+  PTV       Full                                                             +---------+---------------+---------+-----------+----------+--------------+  PERO                                                       Not visualized  +---------+---------------+---------+-----------+----------+--------------+  Summary: Right: No evidence of common femoral vein obstruction. Left: There is no evidence of deep vein thrombosis in the lower extremity. No cystic structure found in the popliteal fossa.  *See table(s) above for measurements and observations. Electronically signed by Waverly Ferrari MD on 10/09/2018 at 4:27:36 PM.    Final      LOS: 5 days   Letta Pate, PA-S  10/10/2018, 11:41 AM

## 2018-10-10 NOTE — Care Management Important Message (Signed)
Important Message  Patient Details  Name: [name] MRN: [id number] Date of Birth: 26-Feb-1961   Medicare Important Message Given:       Cory Palmer 10/10/2018, 3:30 PM

## 2018-10-11 LAB — GLUCOSE, CAPILLARY
GLUCOSE-CAPILLARY: 161 mg/dL — AB (ref 70–99)
Glucose-Capillary: 147 mg/dL — ABNORMAL HIGH (ref 70–99)
Glucose-Capillary: 149 mg/dL — ABNORMAL HIGH (ref 70–99)
Glucose-Capillary: 102 mg/dL — ABNORMAL HIGH (ref 70–99)

## 2018-10-11 LAB — CULTURE, BLOOD (ROUTINE X 2)
Culture: NO GROWTH
Culture: NO GROWTH

## 2018-10-11 MED ORDER — SODIUM CHLORIDE 0.9 % IV SOLN
INTRAVENOUS | Status: DC | PRN
  Administered 2018-10-11: 10 mL via INTRAVENOUS
  Administered 2018-10-17: 500 mL via INTRAVENOUS

## 2018-10-11 NOTE — Plan of Care (Signed)

## 2018-10-11 NOTE — Progress Notes (Signed)
   10/11/18 1100  Clinical Encounter Type  Visited With Patient  Visit Type Initial  Referral From Nurse  Consult/Referral To Chaplain  Spiritual Encounters  Spiritual Needs Emotional;Prayer  Stress Factors  Patient Stress Factors Health changes;Major life changes   Responded to page for a spiritual care consult. PT was alert and wanting to talk. PT showed concern about where he might be going after discharge. He stated he spoke with Child psychotherapist and is hesitant about going to a suggested place after discharge. Also, he mentioned that he lost all contacts for family and would like to reach out to them. I suggested that he reach out to his Nurse for a social worker consult for further information on finding his family. PT was open to the idea and stated he will tell his Nurse. Pt requested I pray with him. I offered spiritual care with words of comfort, empathic listening, ministry of presence, and prayer. PT was very thankful for the Chaplain visit.  Chaplain Orest Dikes [phone]

## 2018-10-11 NOTE — Progress Notes (Signed)
CS still searching for SNF willing to accept patient.   Osborne Casco [place] LCSW [phone]

## 2018-10-11 NOTE — Progress Notes (Signed)
Physical Therapy Treatment Patient Details Name: Cory Palmer MRN: [id number] DOB: 08-Jan-1961 Today's Date: 10/11/2018    History of Present Illness 58 yo male with onset of sepsis and chest pain was admitted, is homeless and has most recently been in a hotel with a couple.  Pt has unclear instructions about WB on LLE, no shoe and no supplies for use of R prosthesis for BK amputation.  Pt also reports prosthesis is scheduled for reconstruction.  PMHx:  L transmet amputation, bipolar depression, anxiety, CHF, DM, HTN,     PT Comments    Patient progressing with transfers scooting to drop arm chair and continues to need education regarding importance of maintaining NWB L LE due to nonhealing infection in L transmet amputation.  Prosthetist in to deliver temporary prosthetic for R LE.  May be able to pivot on R leg with practice.  Will continue skilled PT in the acute setting and recommend SNF level rehab upon d/c.    Follow Up Recommendations  SNF     Equipment Recommendations  Wheelchair (measurements PT);Wheelchair cushion (measurements PT)    Recommendations for Other Services       Precautions / Restrictions Precautions Precautions: Fall Required Braces or Orthoses: Other Brace Other Brace: post op shoe for L LE, prosthesis delivered 3/12 for R LE Restrictions Weight Bearing Restrictions: Yes LLE Weight Bearing: Non weight bearing Other Position/Activity Restrictions: strict NWB per MD note    Mobility  Bed Mobility Overal bed mobility: Modified Independent                Transfers Overall transfer level: Needs assistance Equipment used: None Transfers: Lateral/Scoot Transfers          Lateral/Scoot Transfers: Supervision;From elevated surface General transfer comment: scooting bed to drop arm recliner, at the time did not have his prosthesis; prosthetist arrived after transfer to fit for temporary prosthesis   Ambulation/Gait                  Stairs             Wheelchair Mobility    Modified Rankin (Stroke Patients Only)       Balance Overall balance assessment: Needs assistance Sitting-balance support: Single extremity supported Sitting balance-Leahy Scale: Good                                      Cognition Arousal/Alertness: Awake/alert Behavior During Therapy: Anxious Overall Cognitive Status: No family/caregiver present to determine baseline cognitive functioning                                 General Comments: decreased safety awareness      Exercises General Exercises - Lower Extremity Ankle Circles/Pumps: AROM;Left;20 reps;Seated Long Arc Quad: AROM;Both;10 reps;Seated Hip ABduction/ADduction: Strengthening;Both;10 reps;Seated(adductor squeezes) Hip Flexion/Marching: AROM;Both;10 reps;Seated    General Comments General comments (skin integrity, edema, etc.): Relayed to NT information from prosthetist about wearing schedule for liner and leg.  Stated to wear liner about an hour at a time for 3 hours today and to not wear the leg more than an hour at a time.        Pertinent Vitals/Pain Pain Assessment: Faces Faces Pain Scale: Hurts a little bit Pain Location: L leg  Pain Descriptors / Indicators: Sore Pain Intervention(s): Monitored during session;Repositioned    Home Living  Prior Function            PT Goals (current goals can now be found in the care plan section) Progress towards PT goals: Progressing toward goals    Frequency    Min 2X/week      PT Plan Current plan remains appropriate    Co-evaluation              AM-PAC PT "6 Clicks" Mobility   Outcome Measure  Help needed turning from your back to your side while in a flat bed without using bedrails?: None Help needed moving from lying on your back to sitting on the side of a flat bed without using bedrails?: None Help needed moving to and from  a bed to a chair (including a wheelchair)?: A Little Help needed standing up from a chair using your arms (e.g., wheelchair or bedside chair)?: Total Help needed to walk in hospital room?: Total Help needed climbing 3-5 steps with a railing? : Total 6 Click Score: 14    End of Session   Activity Tolerance: Patient tolerated treatment well Patient left: in chair;with chair alarm set;with call bell/phone within reach Nurse Communication: Mobility status PT Visit Diagnosis: Other abnormalities of gait and mobility (R26.89)     Time: 1420-1501 PT Time Calculation (min) (ACUTE ONLY): 41 min  Charges:  $Therapeutic Exercise: 8-22 mins $Therapeutic Activity: 23-37 mins                     Sheran Lawless,  Acute Rehabilitation Services [phone] 10/11/2018    Cory Palmer 10/11/2018, 5:11 PM

## 2018-10-11 NOTE — Progress Notes (Signed)
Nutrition Brief Note  RN alerted RD that pt is requesting snacks between meals due to increased appetite.  Spoke with pt at bedside who reports that he is still hungry after meals. RD to order snacks BID between meals. Pt agreeable to this plan.  Body mass index is 33.06 kg/m. Patient meets criteria for obesity class I based on current BMI.   Current diet order is Heart Healthy/Carb Modified, patient is consuming approximately 100% of meals at this time. Labs and medications reviewed.   No nutrition interventions warranted at this time. If nutrition issues arise, please consult RD.    Earma Reading, MS, RD, LDN Inpatient Clinical Dietitian Pager: [phone] Weekend/After Hours: [phone]

## 2018-10-11 NOTE — Progress Notes (Signed)
PROGRESS NOTE        PATIENT DETAILS Name: Cory Palmer Age: 58 y.o. Sex: male Date of Birth: June 21, 1961 Admit Date: 10/04/2018 Admitting Physician Bobette Mo, MD [id number], [name] Per  Brief Narrative: Patient is a 58 y.o. 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 Associated symptoms include shortness of breath, heart palpitations, abdominal pain, nausea, and vomiting. Reported subjective fevers. In ED, troponins negative. Initial lactate 2.6, leukocytosis at 19.7. X-ray of left root negative for osteomyelitis. Patient was started on empiric iv antibiotics and admitted for sepsis, most likely secondary to left foot surgical site.   Subjective: Patient resting comfortably in bed, continues to improve. No complaints. He denies chest pain, shortness of breath, abdominal pain, nausea, and vomiting.    Assessment/Plan: Principal Problem:   Sepsis without acute organ dysfunction (HCC) Active Problems:   Homelessness   Diabetes mellitus type 2, noninsulin dependent (HCC)   S/P transmetatarsal amputation of foot, left (HCC)   Depressed bipolar disorder (HCC)   Hypertension   Hyperbilirubinemia   Bacteremia due to Gram-positive bacteria   Hypokalemia   Hypomagnesemia   PAF (paroxysmal atrial fibrillation) (HCC)   Hx of BKA, right (HCC)  1. Sepsis, MSSA, and strep bacteremia, most likely secondary to left foot soft tissue infection  -Echo performed 3/6, no noted vegetation  -Blood cultures on 3/5 with MSSA and group G streptococcus. Repeat cultures without growth. Patient remains afebrile, hemodynamically stable, without leukocytosis. ID consulted, will continue iv cefazolin and transition to po  keflex at discharge.  2. Left foot status post transmetatarsal amputation -Venous US negative for DVT bilaterally  -Ortho tech, wound care consulted. PT evaluation, recommendation for SNF  -Patient to follow-up with Dr. [name] as outpatient   3. Paroxysmal atrial fibrillation -Chest x-ray with mild cardiomegaly, Echo with EF of 60-65%, EKG with no ischemic changes. Tele reviewed today- Afib with HR in 50-60s, rate controlled without medication. He is not currently on anticoagulation- not a good candidate given persistent anemia and thrombocytopenia. Cardiology consulted, patient to follow-up on outpatient basis in 2-4 weeks.   4. Type 2 diabetes -Stable, CBG 147 this am. Continue SSI with novolog   5. Depressed bipolar disorder -Psychiatry consulted, supportive therapy provided  -Appears stable, continue Seroquel, trazodone  6. Homelessness, substance abuse (cocaine) -Social work consulted, difficulty with facility placement given prior history    Telemetry (independently reviewed): atrial fibrillation   Echo (reviewed):EF 60-65% on TTE done on 10/05/18  Old records review: outpatient records  Morning labs/Imaging ordered: yes  DVT Prophylaxis: Prophylactic Lovenox  Code Status: Full code  Family Communication: None  Disposition Plan: Remain inpatient pending placement.   Antimicrobial agents: Anti-infectives (From admission, onward)   Start     Dose/Rate Route Frequency Ordered Stop   10/05/18 1430  ceFAZolin (ANCEF) IVPB 2g/100 mL premix     2 g 200 mL/hr over 30 Minutes Intravenous Every 8 hours 10/05/18 1422     10/05/18 1000  vancomycin (VANCOCIN) 1,500 mg in sodium chloride 0.9 % 500 mL IVPB  Status:  Discontinued     1,500 mg 250 mL/hr over 120 Minutes Intravenous Every 12 hours  10/04/18 2325 10/05/18 1421   10/05/18 0800  ceFEPIme (MAXIPIME) 1 g in sodium chloride 0.9 % 100 mL IVPB  Status:  Discontinued     1 g 200 mL/hr over 30 Minutes Intravenous Every 8  hours 10/04/18 2325 10/05/18 1421   10/04/18 2330  vancomycin (VANCOCIN) 2,000 mg in sodium chloride 0.9 % 500 mL IVPB     2,000 mg 250 mL/hr over 120 Minutes Intravenous  Once 10/04/18 2315 10/05/18 0144   10/04/18 2315  ceFEPIme (MAXIPIME) 2 g in sodium chloride 0.9 % 100 mL IVPB     2 g 200 mL/hr over 30 Minutes Intravenous  Once 10/04/18 2311 10/05/18 0020   10/04/18 2315  metroNIDAZOLE (FLAGYL) IVPB 500 mg  Status:  Discontinued     500 mg 100 mL/hr over 60 Minutes Intravenous Every 8 hours 10/04/18 2311 10/05/18 1421   10/04/18 2315  vancomycin (VANCOCIN) IVPB 1000 mg/200 mL premix  Status:  Discontinued     1,000 mg 200 mL/hr over 60 Minutes Intravenous  Once 10/04/18 2311 10/04/18 2315      Procedures: Echo  CONSULTS: Orthopedics  Cardiology Psychiatry   MEDICATIONS: Scheduled Meds:  enoxaparin (LOVENOX) injection  50 mg Subcutaneous Q24H   insulin aspart  0-20 Units Subcutaneous TID WC   nicotine  14 mg Transdermal Daily   polyethylene glycol  17 g Oral BID   QUEtiapine  400 mg Oral QHS   senna-docusate  1 tablet Oral BID   tamsulosin  0.4 mg Oral QPC supper   traZODone  100 mg Oral QHS   vitamin B-12  1,000 mcg Oral Daily   Continuous Infusions:   ceFAZolin (ANCEF) IV 2 g (10/11/18 0510)   PRN Meds:.acetaminophen, nicotine, ondansetron (ZOFRAN) IV   PHYSICAL EXAM: Vital signs: Vitals:   10/10/18 0650 10/10/18 0736 10/10/18 1156 10/11/18 0511  BP: 100/62   99/73  Pulse: 60   60  Resp: 18   18  Temp:  97.7 F (36.5 C) 98.5 F (36.9 C) 98.1 F (36.7 C)  TempSrc:  Oral Oral Oral  SpO2: 95%   96%  Weight:      Height:       Filed Weights   10/07/18 0332 10/08/18 0302 10/09/18 0700  Weight: 109 kg 109.7 kg 104.5 kg   Body mass index is 33.06 kg/m.   General: Awake, alert, not in any distress. Chronically ill appearing  HEENT: Atraumatic. No scleral icterus  CV: Irregular rate and rhythm. No murmurs  Pulm: Good air entry bilaterally, no  added sounds  GI: Non tender and not distended with no guarding Extremities: Mild edema of left lower leg. Right leg s/p BKA Neurology: Speech clear, sensation is grossly intact. Moves all four extremities  Skin: No rash. Left foot dressing in place.  LABORATORY DATA: CBC: Recent Labs  Lab 10/06/18 0237 10/07/18 0830 10/08/18 0508 10/09/18 0341 10/10/18 0742  WBC 5.6 4.1 3.7* 4.5 6.1  NEUTROABS 3.7 2.4 2.0 2.4 3.6  HGB 8.6* 8.6* 8.9* 9.3* 10.0*  HCT 24.9* 25.2* 26.7* 26.3* 29.5*  MCV 94.0 94.4 93.7 92.6 93.9  PLT 87* 90* 99* 113* 122*    Basic Metabolic Panel: Recent Labs  Lab 10/06/18 0237 10/07/18 0830 10/08/18 0508 10/09/18 0341 10/10/18 0742  NA 136 135 139 138 137  K 3.4* 3.8 3.5 3.9 4.0  CL 109 110 111 107 108  CO2 19* 18* 21* 23 21*  GLUCOSE 125* 216* 163* 186* 124*  BUN 9  CREATININE 0.73 0.78 0.78 0.74 0.75  CALCIUM 7.8* 7.8* 8.0* 8.5* 8.8*  MG 1.6* 1.5* 1.8 1.6* 1.8    GFR: Estimated Creatinine Clearance: 121.9 mL/min (by C-G formula based on SCr of 0.75 mg/dL).  Liver Function Tests: Recent Labs  Lab 10/04/18 2231 10/05/18 0618 10/08/18 0508  AST ALT ALKPHOS 112 90 89  BILITOT 5.2* 3.0* 0.8  PROT 7.2 6.2* 5.2*  ALBUMIN 3.7 3.1* 2.4*   Recent Labs  Lab 10/04/18 2231  LIPASE 21   No results for input(s): AMMONIA in the last 168 hours.  Coagulation Profile: No results for input(s): INR, PROTIME in the last 168 hours.  Cardiac Enzymes: Recent Labs  Lab 10/05/18 0618 10/05/18 1207  TROPONINI <0.03 <0.03    BNP (last 3 results) No results for input(s): PROBNP in the last 8760 hours.  HbA1C: No results for input(s): HGBA1C in the last 72 hours.  CBG: Recent Labs  Lab 10/10/18 0735 10/10/18 1153 10/10/18 1639 10/10/18 2125 10/11/18 0758  GLUCAP 112* 129* 118* 142* 147*    Lipid Profile: No results for input(s): CHOL, HDL, LDLCALC, TRIG, CHOLHDL, LDLDIRECT in the last 72 hours.  Thyroid  Function Tests: No results for input(s): TSH, T4TOTAL, FREET4, T3FREE, THYROIDAB in the last 72 hours.  Anemia Panel: No results for input(s): VITAMINB12, FOLATE, FERRITIN, TIBC, IRON, RETICCTPCT in the last 72 hours.  Urine analysis:    Component Value Date/Time   COLORURINE AMBER (A) 10/05/2018 1153   APPEARANCEUR CLEAR 10/05/2018 1153   LABSPEC 1.034 (H) 10/05/2018 1153   PHURINE 5.0 10/05/2018 1153   GLUCOSEU NEGATIVE 10/05/2018 1153   HGBUR NEGATIVE 10/05/2018 1153   BILIRUBINUR SMALL (A) 10/05/2018 1153   KETONESUR 5 (A) 10/05/2018 1153   PROTEINUR 30 (A) 10/05/2018 1153   NITRITE NEGATIVE 10/05/2018 1153   LEUKOCYTESUR NEGATIVE 10/05/2018 1153    Sepsis Labs: Lactic Acid, Venous    Component Value Date/Time   LATICACIDVEN 1.4 10/05/2018 0618    MICROBIOLOGY: Recent Results (from the past 240 hour(s))  Blood Culture (routine x 2)     Status: Abnormal   Collection Time: 10/04/18 10:34 PM  Result Value Ref Range Status   Specimen Description BLOOD LEFT HAND  Final   Special Requests   Final    BOTTLES DRAWN AEROBIC AND ANAEROBIC Blood Culture adequate volume   Culture  Setup Time   Final    GRAM POSITIVE COCCI AEROBIC BOTTLE ONLY CRITICAL RESULT CALLED TO, READ BACK BY AND VERIFIED WITH: PHRMD M PHAM  10/05/2018 BY S GEZAHEGN Performed at [facility], [address], [place], [place] [address]    Culture STAPHYLOCOCCUS AUREUS STREPTOCOCCUS GROUP G  (A)  Final   Report Status 10/09/2018 FINAL  Final   Organism ID, Bacteria STAPHYLOCOCCUS AUREUS  Final   Organism ID, Bacteria STREPTOCOCCUS GROUP G  Final      Susceptibility   Staphylococcus aureus - MIC*    CIPROFLOXACIN <=0.5 SENSITIVE Sensitive     ERYTHROMYCIN >=8 RESISTANT Resistant     GENTAMICIN <=0.5 SENSITIVE Sensitive     OXACILLIN 0.5 SENSITIVE Sensitive     TETRACYCLINE <=1 SENSITIVE Sensitive     VANCOMYCIN <=0.5 SENSITIVE Sensitive     TRIMETH/SULFA <=10 SENSITIVE Sensitive      CLINDAMYCIN >=8 RESISTANT Resistant     RIFAMPIN <=0.5 SENSITIVE Sensitive     Inducible Clindamycin NEGATIVE Sensitive     * STAPHYLOCOCCUS AUREUS   Streptococcus group g -  MIC*    CLINDAMYCIN >=1 RESISTANT Resistant     AMPICILLIN <=0.25 SENSITIVE Sensitive     ERYTHROMYCIN >=8 RESISTANT Resistant     VANCOMYCIN 0.25 SENSITIVE Sensitive     CEFTRIAXONE <=0.12 SENSITIVE Sensitive     LEVOFLOXACIN 0.5 SENSITIVE Sensitive     * STREPTOCOCCUS GROUP G  Blood Culture ID Panel (Reflexed)     Status: Abnormal   Collection Time: 10/04/18 10:34 PM  Result Value Ref Range Status   Enterococcus species NOT DETECTED NOT DETECTED Final   Listeria monocytogenes NOT DETECTED NOT DETECTED Final   Staphylococcus species DETECTED (A) NOT DETECTED Final    Comment: CRITICAL RESULT CALLED TO, READ BACK BY AND VERIFIED WITH: PHRMD M PHAM @1356  10/05/2018 BY S GEZAHEGN    Staphylococcus aureus (BCID) DETECTED (A) NOT DETECTED Final    Comment: Methicillin (oxacillin) susceptible Staphylococcus aureus (MSSA). Preferred therapy is anti staphylococcal beta lactam antibiotic (Cefazolin or Nafcillin), unless clinically contraindicated. CRITICAL RESULT CALLED TO, READ BACK BY AND VERIFIED WITH: PHRMD M PHAM @1356  10/05/2018 BY S GEZAHEGN    Methicillin resistance NOT DETECTED NOT DETECTED Final   Streptococcus species DETECTED (A) NOT DETECTED Final    Comment: Not Enterococcus species, Streptococcus agalactiae, Streptococcus pyogenes, or Streptococcus pneumoniae. CRITICAL RESULT CALLED TO, READ BACK BY AND VERIFIED WITH: PHRMD M PHAM @1356  10/05/2018 BY S GEZAHEGN    Streptococcus agalactiae NOT DETECTED NOT DETECTED Final   Streptococcus pneumoniae NOT DETECTED NOT DETECTED Final   Streptococcus pyogenes NOT DETECTED NOT DETECTED Final   Acinetobacter baumannii NOT DETECTED NOT DETECTED Final   Enterobacteriaceae species NOT DETECTED NOT DETECTED Final   Enterobacter cloacae complex NOT DETECTED NOT DETECTED  Final   Escherichia coli NOT DETECTED NOT DETECTED Final   Klebsiella oxytoca NOT DETECTED NOT DETECTED Final   Klebsiella pneumoniae NOT DETECTED NOT DETECTED Final   Proteus species NOT DETECTED NOT DETECTED Final   Serratia marcescens NOT DETECTED NOT DETECTED Final   Haemophilus influenzae NOT DETECTED NOT DETECTED Final   Neisseria meningitidis NOT DETECTED NOT DETECTED Final   Pseudomonas aeruginosa NOT DETECTED NOT DETECTED Final   Candida albicans NOT DETECTED NOT DETECTED Final   Candida glabrata NOT DETECTED NOT DETECTED Final   Candida krusei NOT DETECTED NOT DETECTED Final   Candida parapsilosis NOT DETECTED NOT DETECTED Final   Candida tropicalis NOT DETECTED NOT DETECTED Final    Comment: Performed at [facility], [address], [place], [place] [address]  Blood Culture (routine x 2)     Status: None   Collection Time: 10/04/18 10:35 PM  Result Value Ref Range Status   Specimen Description BLOOD LEFT ANTECUBITAL  Final   Special Requests   Final    BOTTLES DRAWN AEROBIC ONLY Blood Culture results may not be optimal due to an inadequate volume of blood received in culture bottles   Culture   Final    NO GROWTH 5 DAYS Performed at [facility], [address], [place], [place] [address]    Report Status 10/09/2018 FINAL  Final  Culture, blood (routine x 2)     Status: None   Collection Time: 10/06/18  2:37 AM  Result Value Ref Range Status   Specimen Description BLOOD RIGHT ANTECUBITAL  Final   Special Requests   Final    BOTTLES DRAWN AEROBIC ONLY Blood Culture results may not be optimal due to an inadequate volume of blood received in culture bottles   Culture   Final  NO GROWTH 5 DAYS Performed at [facility], [address] [place], [place] [address]    Report Status 10/11/2018 FINAL  Final  Culture, blood (routine x 2)     Status: None   Collection Time: 10/06/18  2:37 AM  Result Value Ref Range Status   Specimen Description BLOOD RIGHT  ANTECUBITAL  Final   Special Requests   Final    BOTTLES DRAWN AEROBIC ONLY Blood Culture results may not be optimal due to an inadequate volume of blood received in culture bottles   Culture   Final    NO GROWTH 5 DAYS Performed at [facility], [address], [place], [place] [address]    Report Status 10/11/2018 FINAL  Final    RADIOLOGY STUDIES/RESULTS: Dg Chest 2 View  Result Date: 10/04/2018 CLINICAL DATA:  Cough for 4 days EXAM: CHEST - 2 VIEW COMPARISON:  05/08/2018 FINDINGS: Mild cardiomegaly. No overt pulmonary edema. Both lungs are clear. The visualized skeletal structures are unremarkable. IMPRESSION: No active cardiopulmonary disease.  Mild cardiomegaly Electronically Signed   By: Jasmine Pang M.D.   On: 10/04/2018 22:52   Dg Foot Complete Left  Result Date: 10/05/2018 CLINICAL DATA:  Recent foot surgery possible wound infection. EXAM: LEFT FOOT - COMPLETE 3+ VIEW COMPARISON:  Preoperative radiograph 09/24/2018 FINDINGS: Post transmetatarsal amputation of foot. The resection margins are sharp. No periosteal reaction or bony destructive change. Three linear foreign bodies projecting over the foot are unchanged prior exam, likely in the soft tissues, less likely external to the patient given persistence on pre and postoperative imaging. No tracking soft tissue air. Overlying dressing in place. IMPRESSION: Post transmetatarsal amputation of the foot. No radiographic findings of osteomyelitis. Electronically Signed   By: Narda Rutherford M.D.   On: 10/05/2018 00:04   Xr Foot Complete Left  Result Date: 09/24/2018 3 view radiographs of the left foot shows a previous ray amputation of the first second and third metatarsals.  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  There are multiple retained needles within the soft tissue.  Vas [place] Lower Extremity Venous (dvt)  Result Date: 10/09/2018  Lower Venous  Study Indications: Edema.  Performing Technologist: Blanch Media RVS  Examination Guidelines: A complete evaluation includes B-mode imaging, spectral Doppler, color Doppler, and power Doppler as needed of all accessible portions of each vessel. Bilateral testing is considered an integral part of a complete examination. Limited examinations for reoccurring indications may be performed as noted.  Right Venous Findings: +---+---------------+---------+-----------+----------+-------+      Compressibility Phasicity Spontaneity Properties Summary  +---+---------------+---------+-----------+----------+-------+  CFV Full            Yes       Yes                             +---+---------------+---------+-----------+----------+-------+  Left Venous Findings: +---------+---------------+---------+-----------+----------+--------------+            Compressibility Phasicity Spontaneity Properties Summary         +---------+---------------+---------+-----------+----------+--------------+  CFV       Full            Yes       Yes                                    +---------+---------------+---------+-----------+----------+--------------+  SFJ       Full                                                             +---------+---------------+---------+-----------+----------+--------------+  FV Prox   Full                                                             +---------+---------------+---------+-----------+----------+--------------+  FV Mid    Full                                                             +---------+---------------+---------+-----------+----------+--------------+  FV Distal Full                                                             +---------+---------------+---------+-----------+----------+--------------+  PFV       Full                                                             +---------+---------------+---------+-----------+----------+--------------+  POP       Full            Yes       Yes                                     +---------+---------------+---------+-----------+----------+--------------+  PTV       Full                                                             +---------+---------------+---------+-----------+----------+--------------+  PERO                                                       Not visualized  +---------+---------------+---------+-----------+----------+--------------+    Summary: Right: No evidence of common femoral vein obstruction. Left: There is no evidence of deep vein thrombosis in the lower extremity. No cystic structure found in the popliteal fossa.  *See table(s) above for measurements and observations. Electronically signed by Waverly Ferrarihristopher Dickson MD on 10/09/2018 at 4:27:36 PM.    Final      LOS: 6 days  Letta Pate, PA-S  10/11/2018, 11:15 AM

## 2018-10-12 ENCOUNTER — Telehealth (INDEPENDENT_AMBULATORY_CARE_PROVIDER_SITE_OTHER): Payer: Self-pay | Admitting: Orthopedic Surgery

## 2018-10-12 LAB — CREATININE, SERUM
Creatinine, Ser: 0.86 mg/dL (ref 0.61–1.24)
GFR calc Af Amer: >60 mL/min (ref >60)
GFR calc non Af Amer: >60 mL/min (ref >60)

## 2018-10-12 LAB — GLUCOSE, CAPILLARY
Glucose-Capillary: 221 mg/dL — ABNORMAL HIGH (ref 70–99)
Glucose-Capillary: 149 mg/dL — ABNORMAL HIGH (ref 70–99)
Glucose-Capillary: 191 mg/dL — ABNORMAL HIGH (ref 70–99)
Glucose-Capillary: 194 mg/dL — ABNORMAL HIGH (ref 70–99)

## 2018-10-12 NOTE — Progress Notes (Addendum)
Subjective:    Patient reports pain as mild.    Objective: Vital signs in last 24 hours: Temp:  [97.7 F (36.5 C)-98.5 F (36.9 C)] 97.7 F (36.5 C) (03/13 0601) Pulse Rate:  [61-78] 71 (03/13 0601) Resp:  [16-19] 18 (03/13 0601) BP: (83-108)/(54-70) 83/54 (03/13 0601) SpO2:  [94 %-98 %] 94 % (03/13 0601)  Intake/Output from previous day: 03/12 0701 - 03/13 0700 In: 230.8 [P.O.:120; I.V.:10.8; IV Piggyback:100] Out: 1300 [Urine:1300] Intake/Output this shift: No intake/output data recorded.  Recent Labs    10/10/18 0742  HGB 10.0*   Recent Labs    10/10/18 0742  WBC 6.1  RBC 3.14*  HCT 29.5*  PLT 122*   Recent Labs    10/10/18 0742 10/12/18 0410  NA 137  --   K 4.0  --   CL 108  --   CO2 21*  --   BUN 9  --   CREATININE 0.75 0.86  GLUCOSE 124*  --   CALCIUM 8.8*  --    No results for input(s): LABPT, INR in the last 72 hours.  Left foot transmetatarsal amputation site with sutures intact. Moist with xeroform dressing in place over the incisional area, will DC and use dry dressing to control moisture over the area. No signs of cellulitis of foot. Palpable dorsalis pedis pulse. Scant serous drainage without odor.      Assessment/Plan:    Continue dry dressing changes to left transmetatarsal site daily. Off load foot as much as possible and elevate as much as possible.  [facility] did obtain temporary prosthesis for right transtibial amputation for patient to use.  Continue therapies.  Looking for SNF placement for patient per Social work.  Follow up in office following DC to SNF.   Lazaro Arms, PA-C 10/12/2018, 7:52 AM  [facility] [phone]

## 2018-10-12 NOTE — Progress Notes (Signed)
PROGRESS NOTE        PATIENT DETAILS Name: Cory Palmer Age: 58 y.o. Sex: male Date of Birth: [birth year] Admit Date: 10/04/2018 Admitting Physician Bobette Mo, MD ZOX:[id number], [name] Per  Brief Narrative: Patient is a 58 y.o. 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 Associated symptoms include shortness of breath, heart palpitations, abdominal pain, nausea, and vomiting. Reported subjective fevers. In ED, troponins negative. Initial lactate 2.6, leukocytosis at 19.7. X-ray of left root negative for osteomyelitis. Patient was started on empiric iv antibiotics and admitted for sepsis, most likely secondary to left foot surgical site.   Subjective: Patient lying comfortably in bed, reports no new issues over night. No complaints. He denies chest pain, shortness of breath, abdominal pain, nausea, vomiting, or diarrhea.   Assessment/Plan: Principal Problem:   Sepsis without acute organ dysfunction (HCC) Active Problems:   Homelessness   Diabetes mellitus type 2, noninsulin dependent (HCC)   S/P transmetatarsal amputation of foot, left (HCC)   Depressed bipolar disorder (HCC)   Hypertension   Hyperbilirubinemia   Bacteremia due to Gram-positive bacteria   Hypokalemia   Hypomagnesemia   PAF (paroxysmal atrial fibrillation) (HCC)   Hx of BKA, right (HCC)  1. Sepsis, MSSA, and strep bacteremia, most likely secondary to left foot soft tissue infection  -Echo performed 3/6, no noted vegetation  -Blood cultures on 3/5 with MSSA and groupGstreptococcus. Repeat cultures without growth. Patient remains afebrile without leukocytosis. Continue current dose of iv cefazolin, transition to po keflex at  discharge per recommendations from ID.   2. Left foot s/p transmetatarsal amputation, right leg s/p BKA  -Venous US negative for DVT bilaterally. -Wound care and ortho tech consulted- [facility] able to obtain temporary prosthesis for right BKA for patient to use  -Followed by orthopedics. Patient to follow-up with Dr. [name] as outpatient  -PT consulted, recommendation for SNF placement   3. Paroxysmal atrial fibrillation -Chest x-ray with mild cardiomegaly, Echo with EF of 60-65%, EKG with no ischemic changes.  -Rate controlled without medication. He is not currently on anticoagulation- not a good candidate given persistent anemia and thrombocytopenia.  -Cardiology consulted, patient to follow-up on outpatient basis in 2-4 weeks.   4. Type 2 diabetes -Stable, CBG 221 this am. Continue SSI, heart healthy/carb modified diet   5. Depressed bipolar disorder -Psychiatry consulted, supportive therapy provided  -Appears stable, continue Seroquel, trazodone  6. Homelessness, substance abuse (tabacco, cocaine) -Social work consulted, continuing to work on SNF- difficult placement given prior history.  -Continue transdermal nicotine  Echo (reviewed):EF60-65%on TTE done on 10/05/18  Old records review: outpatient records  Morning labs/Imaging ordered: yes  DVT Prophylaxis: Prophylactic Lovenox   Code Status: Full code   Family Communication: None  Disposition Plan: Remain inpatient pending SNF placement  Antimicrobial agents: Anti-infectives (From admission, onward)   Start     Dose/Rate Route Frequency Ordered Stop   10/05/18 1430  ceFAZolin (ANCEF) IVPB 2g/100 mL premix     2 g 200 mL/hr over 30 Minutes Intravenous Every 8 hours 10/05/18 1422     10/05/18 1000  vancomycin (VANCOCIN) 1,500 mg in sodium chloride 0.9 % 500  mL IVPB  Status:  Discontinued     1,500 mg 250 mL/hr over 120 Minutes Intravenous Every 12 hours 10/04/18 2325 10/05/18 1421   10/05/18 0800  ceFEPIme  (MAXIPIME) 1 g in sodium chloride 0.9 % 100 mL IVPB  Status:  Discontinued     1 g 200 mL/hr over 30 Minutes Intravenous Every 8 hours 10/04/18 2325 10/05/18 1421   10/04/18 2330  vancomycin (VANCOCIN) 2,000 mg in sodium chloride 0.9 % 500 mL IVPB     2,000 mg 250 mL/hr over 120 Minutes Intravenous  Once 10/04/18 2315 10/05/18 0144   10/04/18 2315  ceFEPIme (MAXIPIME) 2 g in sodium chloride 0.9 % 100 mL IVPB     2 g 200 mL/hr over 30 Minutes Intravenous  Once 10/04/18 2311 10/05/18 0020   10/04/18 2315  metroNIDAZOLE (FLAGYL) IVPB 500 mg  Status:  Discontinued     500 mg 100 mL/hr over 60 Minutes Intravenous Every 8 hours 10/04/18 2311 10/05/18 1421   10/04/18 2315  vancomycin (VANCOCIN) IVPB 1000 mg/200 mL premix  Status:  Discontinued     1,000 mg 200 mL/hr over 60 Minutes Intravenous  Once 10/04/18 2311 10/04/18 2315     Procedures: Echo  CONSULTS: Orthopedics Cardiology Psychiatry   MEDICATIONS: Scheduled Meds:  enoxaparin (LOVENOX) injection  50 mg Subcutaneous Q24H   insulin aspart  0-20 Units Subcutaneous TID WC   nicotine  14 mg Transdermal Daily   polyethylene glycol  17 g Oral BID   QUEtiapine  400 mg Oral QHS   senna-docusate  1 tablet Oral BID   tamsulosin  0.4 mg Oral QPC supper   traZODone  100 mg Oral QHS   vitamin B-12  1,000 mcg Oral Daily   Continuous Infusions:  sodium chloride 10 mL/hr at 10/12/18 1610    ceFAZolin (ANCEF) IV 2 g (10/12/18 0635)   PRN Meds:.sodium chloride, acetaminophen, nicotine, ondansetron (ZOFRAN) IV   PHYSICAL EXAM: Vital signs: Vitals:   10/11/18 0511 10/11/18 1350 10/11/18 2121 10/12/18 0601  BP: 99/73 108/65 107/70 (!) 83/54  Pulse: 60 78 61 71  Resp: 18 16 19 18   Temp: 98.1 F (36.7 C) 98.5 F (36.9 C) 97.7 F (36.5 C) 97.7 F (36.5 C)  TempSrc: Oral Oral  Oral  SpO2: 96% 96% 98% 94%  Weight:      Height:       Filed Weights   10/07/18 0332 10/08/18 0302 10/09/18 0700  Weight: 109 kg 109.7 kg  104.5 kg   Body mass index is 33.06 kg/m.   General: Awake, alert, not in any distress. Appears chronically ill  HEENT: Atraumatic. No scleral icterus  CV: Irregular rhythm. No murmurs  Resp: Good air entry bilaterally GI: Non tender and not distended with no guarding Extremities: Mild edema of left lower leg, improving. Right leg s/p BKA Neurology: Speech clear, non focal, sensation is grossly intact. Moves all four extremities  Psychiatric: Normal mood. Skin: Left foot dressing in place. No rash   LABORATORY DATA: CBC: Recent Labs  Lab 10/06/18 0237 10/07/18 0830 10/08/18 0508 10/09/18 0341 10/10/18 0742  WBC 5.6 4.1 3.7* 4.5 6.1  NEUTROABS 3.7 2.4 2.0 2.4 3.6  HGB 8.6* 8.6* 8.9* 9.3* 10.0*  HCT 24.9* 25.2* 26.7* 26.3* 29.5*  MCV 94.0 94.4 93.7 92.6 93.9  PLT 87* 90* 99* 113* 122*    Basic Metabolic Panel: Recent Labs  Lab 10/06/18 0237 10/07/18 0830 10/08/18 0508 10/09/18 0341 10/10/18 0742 10/12/18 0410  NA 136 135 139  138 137  --   K 3.4* 3.8 3.5 3.9 4.0  --   CL 109 110 111 107 108  --   CO2 19* 18* 21* 23 21*  --   GLUCOSE 125* 216* 163* 186* 124*  --   BUN --   CREATININE 0.73 0.78 0.78 0.74 0.75 0.86  CALCIUM 7.8* 7.8* 8.0* 8.5* 8.8*  --   MG 1.6* 1.5* 1.8 1.6* 1.8  --     GFR: Estimated Creatinine Clearance: 113.4 mL/min (by C-G formula based on SCr of 0.86 mg/dL).  Liver Function Tests: Recent Labs  Lab 10/08/18 0508  AST 19  ALT 10  ALKPHOS 89  BILITOT 0.8  PROT 5.2*  ALBUMIN 2.4*   No results for input(s): LIPASE, AMYLASE in the last 168 hours. No results for input(s): AMMONIA in the last 168 hours.  Coagulation Profile: No results for input(s): INR, PROTIME in the last 168 hours.  Cardiac Enzymes: Recent Labs  Lab 10/05/18 1207  TROPONINI <0.03    BNP (last 3 results) No results for input(s): PROBNP in the last 8760 hours.  HbA1C: No results for input(s): HGBA1C in the last 72 hours.  CBG: Recent Labs   Lab 10/11/18 0758 10/11/18 1154 10/11/18 1655 10/11/18 2120 10/12/18 0820  GLUCAP 147* 161* 149* 102* 221*    Lipid Profile: No results for input(s): CHOL, HDL, LDLCALC, TRIG, CHOLHDL, LDLDIRECT in the last 72 hours.  Thyroid Function Tests: No results for input(s): TSH, T4TOTAL, FREET4, T3FREE, THYROIDAB in the last 72 hours.  Anemia Panel: No results for input(s): VITAMINB12, FOLATE, FERRITIN, TIBC, IRON, RETICCTPCT in the last 72 hours.  Urine analysis:    Component Value Date/Time   COLORURINE AMBER (A) 10/05/2018 1153   APPEARANCEUR CLEAR 10/05/2018 1153   LABSPEC 1.034 (H) 10/05/2018 1153   PHURINE 5.0 10/05/2018 1153   GLUCOSEU NEGATIVE 10/05/2018 1153   HGBUR NEGATIVE 10/05/2018 1153   BILIRUBINUR SMALL (A) 10/05/2018 1153   KETONESUR 5 (A) 10/05/2018 1153   PROTEINUR 30 (A) 10/05/2018 1153   NITRITE NEGATIVE 10/05/2018 1153   LEUKOCYTESUR NEGATIVE 10/05/2018 1153    Sepsis Labs: Lactic Acid, Venous    Component Value Date/Time   LATICACIDVEN 1.4 10/05/2018 0618    MICROBIOLOGY: Recent Results (from the past 240 hour(s))  Blood Culture (routine x 2)     Status: Abnormal   Collection Time: 10/04/18 10:34 PM  Result Value Ref Range Status   Specimen Description BLOOD LEFT HAND  Final   Special Requests   Final    BOTTLES DRAWN AEROBIC AND ANAEROBIC Blood Culture adequate volume   Culture  Setup Time   Final    GRAM POSITIVE COCCI AEROBIC BOTTLE ONLY CRITICAL RESULT CALLED TO, READ BACK BY AND VERIFIED WITH: PHRMD M PHAM  10/05/2018 BY S GEZAHEGN Performed at [facility], [address] [place], [place] [address]    Culture STAPHYLOCOCCUS AUREUS STREPTOCOCCUS GROUP G  (A)  Final   Report Status 10/09/2018 FINAL  Final   Organism ID, Bacteria STAPHYLOCOCCUS AUREUS  Final   Organism ID, Bacteria STREPTOCOCCUS GROUP G  Final      Susceptibility   Staphylococcus aureus - MIC*    CIPROFLOXACIN <=0.5 SENSITIVE Sensitive     ERYTHROMYCIN >=8  RESISTANT Resistant     GENTAMICIN <=0.5 SENSITIVE Sensitive     OXACILLIN 0.5 SENSITIVE Sensitive     TETRACYCLINE <=1 SENSITIVE Sensitive     VANCOMYCIN <=0.5 SENSITIVE Sensitive  TRIMETH/SULFA <=10 SENSITIVE Sensitive     CLINDAMYCIN >=8 RESISTANT Resistant     RIFAMPIN <=0.5 SENSITIVE Sensitive     Inducible Clindamycin NEGATIVE Sensitive     * STAPHYLOCOCCUS AUREUS   Streptococcus group g - MIC*    CLINDAMYCIN >=1 RESISTANT Resistant     AMPICILLIN <=0.25 SENSITIVE Sensitive     ERYTHROMYCIN >=8 RESISTANT Resistant     VANCOMYCIN 0.25 SENSITIVE Sensitive     CEFTRIAXONE <=0.12 SENSITIVE Sensitive     LEVOFLOXACIN 0.5 SENSITIVE Sensitive     * STREPTOCOCCUS GROUP G  Blood Culture ID Panel (Reflexed)     Status: Abnormal   Collection Time: 10/04/18 10:34 PM  Result Value Ref Range Status   Enterococcus species NOT DETECTED NOT DETECTED Final   Listeria monocytogenes NOT DETECTED NOT DETECTED Final   Staphylococcus species DETECTED (A) NOT DETECTED Final    Comment: CRITICAL RESULT CALLED TO, READ BACK BY AND VERIFIED WITH: PHRMD M PHAM @1356  10/05/2018 BY S GEZAHEGN    Staphylococcus aureus (BCID) DETECTED (A) NOT DETECTED Final    Comment: Methicillin (oxacillin) susceptible Staphylococcus aureus (MSSA). Preferred therapy is anti staphylococcal beta lactam antibiotic (Cefazolin or Nafcillin), unless clinically contraindicated. CRITICAL RESULT CALLED TO, READ BACK BY AND VERIFIED WITH: PHRMD M PHAM @1356  10/05/2018 BY S GEZAHEGN    Methicillin resistance NOT DETECTED NOT DETECTED Final   Streptococcus species DETECTED (A) NOT DETECTED Final    Comment: Not Enterococcus species, Streptococcus agalactiae, Streptococcus pyogenes, or Streptococcus pneumoniae. CRITICAL RESULT CALLED TO, READ BACK BY AND VERIFIED WITH: PHRMD M PHAM @1356  10/05/2018 BY S GEZAHEGN    Streptococcus agalactiae NOT DETECTED NOT DETECTED Final   Streptococcus pneumoniae NOT DETECTED NOT DETECTED Final    Streptococcus pyogenes NOT DETECTED NOT DETECTED Final   Acinetobacter baumannii NOT DETECTED NOT DETECTED Final   Enterobacteriaceae species NOT DETECTED NOT DETECTED Final   Enterobacter cloacae complex NOT DETECTED NOT DETECTED Final   Escherichia coli NOT DETECTED NOT DETECTED Final   Klebsiella oxytoca NOT DETECTED NOT DETECTED Final   Klebsiella pneumoniae NOT DETECTED NOT DETECTED Final   Proteus species NOT DETECTED NOT DETECTED Final   Serratia marcescens NOT DETECTED NOT DETECTED Final   Haemophilus influenzae NOT DETECTED NOT DETECTED Final   Neisseria meningitidis NOT DETECTED NOT DETECTED Final   Pseudomonas aeruginosa NOT DETECTED NOT DETECTED Final   Candida albicans NOT DETECTED NOT DETECTED Final   Candida glabrata NOT DETECTED NOT DETECTED Final   Candida krusei NOT DETECTED NOT DETECTED Final   Candida parapsilosis NOT DETECTED NOT DETECTED Final   Candida tropicalis NOT DETECTED NOT DETECTED Final    Comment: Performed at [facility], [address], [place], [place] [address]  Blood Culture (routine x 2)     Status: None   Collection Time: 10/04/18 10:35 PM  Result Value Ref Range Status   Specimen Description BLOOD LEFT ANTECUBITAL  Final   Special Requests   Final    BOTTLES DRAWN AEROBIC ONLY Blood Culture results may not be optimal due to an inadequate volume of blood received in culture bottles   Culture   Final    NO GROWTH 5 DAYS Performed at [facility], [address], [place], [place] [address]    Report Status 10/09/2018 FINAL  Final  Culture, blood (routine x 2)     Status: None   Collection Time: 10/06/18  2:37 AM  Result Value Ref Range Status   Specimen Description BLOOD RIGHT ANTECUBITAL  Final   Special Requests   Final    BOTTLES DRAWN AEROBIC ONLY Blood Culture results may not be optimal due to an inadequate volume of blood received in culture bottles   Culture   Final    NO GROWTH 5 DAYS Performed at [facility],  [address], [place], [place] [address]    Report Status 10/11/2018 FINAL  Final  Culture, blood (routine x 2)     Status: None   Collection Time: 10/06/18  2:37 AM  Result Value Ref Range Status   Specimen Description BLOOD RIGHT ANTECUBITAL  Final   Special Requests   Final    BOTTLES DRAWN AEROBIC ONLY Blood Culture results may not be optimal due to an inadequate volume of blood received in culture bottles   Culture   Final    NO GROWTH 5 DAYS Performed at [facility], [address] [place], [place] [address]    Report Status 10/11/2018 FINAL  Final    RADIOLOGY STUDIES/RESULTS: Dg Chest 2 View  Result Date: 10/04/2018 CLINICAL DATA:  Cough for 4 days EXAM: CHEST - 2 VIEW COMPARISON:  05/08/2018 FINDINGS: Mild cardiomegaly. No overt pulmonary edema. Both lungs are clear. The visualized skeletal structures are unremarkable. IMPRESSION: No active cardiopulmonary disease.  Mild cardiomegaly Electronically Signed   By: Jasmine Pang M.D.   On: 10/04/2018 22:52   Dg Foot Complete Left  Result Date: 10/05/2018 CLINICAL DATA:  Recent foot surgery possible wound infection. EXAM: LEFT FOOT - COMPLETE 3+ VIEW COMPARISON:  Preoperative radiograph 09/24/2018 FINDINGS: Post transmetatarsal amputation of foot. The resection margins are sharp. No periosteal reaction or bony destructive change. Three linear foreign bodies projecting over the foot are unchanged prior exam, likely in the soft tissues, less likely external to the patient given persistence on pre and postoperative imaging. No tracking soft tissue air. Overlying dressing in place. IMPRESSION: Post transmetatarsal amputation of the foot. No radiographic findings of osteomyelitis. Electronically Signed   By: Narda Rutherford M.D.   On: 10/05/2018 00:04   Xr Foot Complete Left  Result Date: 09/24/2018 3 view radiographs of the left foot shows a previous ray amputation of the first second and third metatarsals.  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  There are multiple retained needles within the soft tissue.  Vas [place] Lower Extremity Venous (dvt)  Result Date: 10/09/2018  Lower Venous Study Indications: Edema.  Performing Technologist: Blanch Media RVS  Examination Guidelines: A complete evaluation includes B-mode imaging, spectral Doppler, color Doppler, and power Doppler as needed of all accessible portions of each vessel. Bilateral testing is considered an integral part of a complete examination. Limited examinations for reoccurring indications may be performed as noted.  Right Venous Findings: +---+---------------+---------+-----------+----------+-------+      Compressibility Phasicity Spontaneity Properties Summary  +---+---------------+---------+-----------+----------+-------+  CFV Full            Yes       Yes                             +---+---------------+---------+-----------+----------+-------+  Left Venous Findings: +---------+---------------+---------+-----------+----------+--------------+            Compressibility Phasicity Spontaneity Properties Summary         +---------+---------------+---------+-----------+----------+--------------+  CFV       Full  Yes       Yes                                    +---------+---------------+---------+-----------+----------+--------------+  SFJ       Full                                                             +---------+---------------+---------+-----------+----------+--------------+  FV Prox   Full                                                             +---------+---------------+---------+-----------+----------+--------------+  FV Mid    Full                                                             +---------+---------------+---------+-----------+----------+--------------+  FV Distal Full                                                              +---------+---------------+---------+-----------+----------+--------------+  PFV       Full                                                             +---------+---------------+---------+-----------+----------+--------------+  POP       Full            Yes       Yes                                    +---------+---------------+---------+-----------+----------+--------------+  PTV       Full                                                             +---------+---------------+---------+-----------+----------+--------------+  PERO                                                       Not visualized  +---------+---------------+---------+-----------+----------+--------------+    Summary: Right: No evidence of common femoral vein obstruction. Left: There is no evidence of deep vein thrombosis  in the lower extremity. No cystic structure found in the popliteal fossa.  *See table(s) above for measurements and observations. Electronically signed by Waverly Ferrari MD on 10/09/2018 at 4:27:36 PM.    Final      LOS: 7 days   Letta Pate, PA-S  10/12/2018, 9:57 AM

## 2018-10-12 NOTE — Telephone Encounter (Signed)
Patient called advised he need to go to the bank to get paperwork filled out to get his checks. Spoke with Shawn and I advised patient he need to speak with his nurse and doctor who's treating him at the hospital. The number to contact patient is [phone]  Room 9

## 2018-10-13 DIAGNOSIS — A4101 Sepsis due to Methicillin susceptible Staphylococcus aureus: Secondary | ICD-10-CM

## 2018-10-13 LAB — CBC
HCT: 28.8 % — ABNORMAL LOW (ref 39.0–52.0)
Hemoglobin: 9.6 g/dL — ABNORMAL LOW (ref 13.0–17.0)
MCH: 31.7 pg (ref 26.0–34.0)
MCHC: 33.3 g/dL (ref 30.0–36.0)
MCV: 95 fL (ref 80.0–100.0)
Platelets: 106 10*3/uL — ABNORMAL LOW (ref 150–400)
RBC: 3.03 MIL/uL — ABNORMAL LOW (ref 4.22–5.81)
RDW: 13.8 % (ref 11.5–15.5)
WBC: 4.8 10*3/uL (ref 4.0–10.5)
nRBC: 0 % (ref 0.0–0.2)

## 2018-10-13 LAB — GLUCOSE, CAPILLARY
Glucose-Capillary: 205 mg/dL — ABNORMAL HIGH (ref 70–99)
Glucose-Capillary: 137 mg/dL — ABNORMAL HIGH (ref 70–99)
Glucose-Capillary: 166 mg/dL — ABNORMAL HIGH (ref 70–99)
Glucose-Capillary: 183 mg/dL — ABNORMAL HIGH (ref 70–99)

## 2018-10-13 LAB — BASIC METABOLIC PANEL
Anion gap: 10 (ref 5–15)
BUN: 13 mg/dL (ref 6–20)
CO2: 19 mmol/L — ABNORMAL LOW (ref 22–32)
Calcium: 9 mg/dL (ref 8.9–10.3)
Chloride: 108 mmol/L (ref 98–111)
Creatinine, Ser: 0.76 mg/dL (ref 0.61–1.24)
GFR calc non Af Amer: >60 mL/min (ref >60)
Glucose, Bld: 156 mg/dL — ABNORMAL HIGH (ref 70–99)
Potassium: 3.7 mmol/L (ref 3.5–5.1)
Sodium: 137 mmol/L (ref 135–145)

## 2018-10-13 MED ORDER — METFORMIN HCL 500 MG PO TABS
1000.0000 mg | ORAL_TABLET | Freq: Every day | ORAL | Status: DC
  Administered 2018-10-14 – 2018-10-18 (×5): 1000 mg via ORAL
  Filled 2018-10-13 (×5): qty 2

## 2018-10-13 MED ORDER — MAGIC MOUTHWASH
10.0000 mL | Freq: Three times a day (TID) | ORAL | Status: DC
  Administered 2018-10-13 (×2): 10 mL via ORAL
  Administered 2018-10-15: 5 mL via ORAL
  Administered 2018-10-16 – 2018-10-18 (×4): 10 mL via ORAL
  Filled 2018-10-13 (×16): qty 10

## 2018-10-13 MED ORDER — PHENOL 1.4 % MT LIQD: 1.0000 | OROMUCOSAL | Status: DC | PRN

## 2018-10-13 NOTE — Progress Notes (Addendum)
PROGRESS NOTE    Cory Palmer  ZOX:[id number]  DOB: 1960-08-20  DOA: 10/04/2018 PCP: Patient, [name] Per  Brief Narrative:  58 year old male with history of right BKA, DM-2, bipolar disorder, cocaine use-presented with chest pain/fevers on 3/6 .He recently had his left foot partially amputated on 2/28, but complained of bleeding in the area after he walked. He was admitted with sepsis secondary to MSSA/strep bacteremia presumed secondary to left foot soft tissue infection but no osteomyelitis. Seen by ID and Orthopedics in this admission. Currently on IV antimicrobial therapy-awaiting SNF placement   Subjective:  Patient resting comfortably.  He is concerned about uncontrolled blood glucose level and asking if his home medication, metformin, can be resumed.  He reports throat itching/pain.  He is also worried about not being able to catch his monthly Social Security check and states was contemplating leaving AMA yesterday   Objective: Vitals:   10/12/18 0601 10/12/18 1245 10/12/18 2146 10/13/18 0550  BP: (!) 83/54 98/62 116/66 110/66  Pulse: 71 69 77 71  Resp: 18 18 20    Temp: 97.7 F (36.5 C) 97.9 F (36.6 C) 98.3 F (36.8 C) 97.8 F (36.6 C)  TempSrc: Oral Oral Oral Oral  SpO2: 94% 97% 97% 96%  Weight:      Height:        Intake/Output Summary (Last 24 hours) at 10/13/2018 1315 Last data filed at 10/13/2018 [id number] Gross per 24 hour  Intake 920 ml  Output 600 ml  Net 320 ml   Filed Weights   10/07/18 0332 10/08/18 0302 10/09/18 0700  Weight: 109 kg 109.7 kg 104.5 kg    Physical Examination:  General exam: Appears calm and comfortable  ENT: Mild pharyngeal erythema, no evidence of thrush or tonsillar enlargement. Respiratory system: Clear to auscultation. Respiratory effort normal. Cardiovascular system: S1 & S2 heard, RRR. No JVD, murmurs, rubs, gallops or clicks. No pedal edema. Gastrointestinal system: Abdomen is nondistended, soft and nontender. No organomegaly  or masses felt. Normal bowel sounds heard. Central nervous system: Alert and oriented. No focal neurological deficits. Extremities: Status post right BKA and dressing at the left foot amputation site Skin: No rashes, lesions or ulcers Psychiatry:. Mood & affect anxious    Data Reviewed: I have personally reviewed following labs and imaging studies  CBC: Recent Labs  Lab 10/07/18 0830 10/08/18 0508 10/09/18 0341 10/10/18 0742 10/13/18 0604  WBC 4.1 3.7* 4.5 6.1 4.8  NEUTROABS 2.4 2.0 2.4 3.6  --   HGB 8.6* 8.9* 9.3* 10.0* 9.6*  HCT 25.2* 26.7* 26.3* 29.5* 28.8*  MCV 94.4 93.7 92.6 93.9 95.0  PLT 90* 99* 113* 122* 106*   Basic Metabolic Panel: Recent Labs  Lab 10/07/18 0830 10/08/18 0508 10/09/18 0341 10/10/18 0742 10/12/18 0410 10/13/18 0604  NA 135 139 138 137  --  137  K 3.8 3.5 3.9 4.0  --  3.7  CL 110 111 107 108  --  108  CO2 18* 21* 23 21*  --  19*  GLUCOSE 216* 163* 186* 124*  --  156*  BUN 7 6 8 9   --  13  CREATININE 0.78 0.78 0.74 0.75 0.86 0.76  CALCIUM 7.8* 8.0* 8.5* 8.8*  --  9.0  MG 1.5* 1.8 1.6* 1.8  --   --    GFR: Estimated Creatinine Clearance: 121.9 mL/min (by C-G formula based on SCr of 0.76 mg/dL). Liver Function Tests: Recent Labs  Lab 10/08/18 0508  AST 19  ALT 10  ALKPHOS 89  BILITOT 0.8  PROT 5.2*  ALBUMIN 2.4*   No results for input(s): LIPASE, AMYLASE in the last 168 hours. No results for input(s): AMMONIA in the last 168 hours. Coagulation Profile: No results for input(s): INR, PROTIME in the last 168 hours. Cardiac Enzymes: No results for input(s): CKTOTAL, CKMB, CKMBINDEX, TROPONINI in the last 168 hours. BNP (last 3 results) No results for input(s): PROBNP in the last 8760 hours. HbA1C: No results for input(s): HGBA1C in the last 72 hours. CBG: Recent Labs  Lab 10/12/18 1145 10/12/18 1714 10/12/18 2148 10/13/18 0802 10/13/18 1152  GLUCAP 149* 191* 194* 205* 137*   Lipid Profile: No results for input(s): CHOL,  HDL, LDLCALC, TRIG, CHOLHDL, LDLDIRECT in the last 72 hours. Thyroid Function Tests: No results for input(s): TSH, T4TOTAL, FREET4, T3FREE, THYROIDAB in the last 72 hours. Anemia Panel: No results for input(s): VITAMINB12, FOLATE, FERRITIN, TIBC, IRON, RETICCTPCT in the last 72 hours. Sepsis Labs: No results for input(s): PROCALCITON, LATICACIDVEN in the last 168 hours.  Recent Results (from the past 240 hour(s))  Blood Culture (routine x 2)     Status: Abnormal   Collection Time: 10/04/18 10:34 PM  Result Value Ref Range Status   Specimen Description BLOOD LEFT HAND  Final   Special Requests   Final    BOTTLES DRAWN AEROBIC AND ANAEROBIC Blood Culture adequate volume   Culture  Setup Time   Final    GRAM POSITIVE COCCI AEROBIC BOTTLE ONLY CRITICAL RESULT CALLED TO, READ BACK BY AND VERIFIED WITH: PHRMD M PHAM @1356  10/05/2018 BY S GEZAHEGN Performed at [facility], [address] [place], [place] [address]    Culture STAPHYLOCOCCUS AUREUS STREPTOCOCCUS GROUP G  (A)  Final   Report Status 10/09/2018 FINAL  Final   Organism ID, Bacteria STAPHYLOCOCCUS AUREUS  Final   Organism ID, Bacteria STREPTOCOCCUS GROUP G  Final      Susceptibility   Staphylococcus aureus - MIC*    CIPROFLOXACIN <=0.5 SENSITIVE Sensitive     ERYTHROMYCIN >=8 RESISTANT Resistant     GENTAMICIN <=0.5 SENSITIVE Sensitive     OXACILLIN 0.5 SENSITIVE Sensitive     TETRACYCLINE <=1 SENSITIVE Sensitive     VANCOMYCIN <=0.5 SENSITIVE Sensitive     TRIMETH/SULFA <=10 SENSITIVE Sensitive     CLINDAMYCIN >=8 RESISTANT Resistant     RIFAMPIN <=0.5 SENSITIVE Sensitive     Inducible Clindamycin NEGATIVE Sensitive     * STAPHYLOCOCCUS AUREUS   Streptococcus group g - MIC*    CLINDAMYCIN >=1 RESISTANT Resistant     AMPICILLIN <=0.25 SENSITIVE Sensitive     ERYTHROMYCIN >=8 RESISTANT Resistant     VANCOMYCIN 0.25 SENSITIVE Sensitive     CEFTRIAXONE <=0.12 SENSITIVE Sensitive     LEVOFLOXACIN 0.5 SENSITIVE  Sensitive     * STREPTOCOCCUS GROUP G  Blood Culture ID Panel (Reflexed)     Status: Abnormal   Collection Time: 10/04/18 10:34 PM  Result Value Ref Range Status   Enterococcus species NOT DETECTED NOT DETECTED Final   Listeria monocytogenes NOT DETECTED NOT DETECTED Final   Staphylococcus species DETECTED (A) NOT DETECTED Final    Comment: CRITICAL RESULT CALLED TO, READ BACK BY AND VERIFIED WITH: PHRMD M PHAM @1356  10/05/2018 BY S GEZAHEGN    Staphylococcus aureus (BCID) DETECTED (A) NOT DETECTED Final    Comment: Methicillin (oxacillin) susceptible Staphylococcus aureus (MSSA). Preferred therapy is anti staphylococcal beta lactam antibiotic (Cefazolin or Nafcillin), unless clinically contraindicated. CRITICAL RESULT CALLED TO, READ BACK BY  AND VERIFIED WITH: PHRMD M PHAM @1356  10/05/2018 BY S GEZAHEGN    Methicillin resistance NOT DETECTED NOT DETECTED Final   Streptococcus species DETECTED (A) NOT DETECTED Final    Comment: Not Enterococcus species, Streptococcus agalactiae, Streptococcus pyogenes, or Streptococcus pneumoniae. CRITICAL RESULT CALLED TO, READ BACK BY AND VERIFIED WITH: PHRMD M PHAM @1356  10/05/2018 BY S GEZAHEGN    Streptococcus agalactiae NOT DETECTED NOT DETECTED Final   Streptococcus pneumoniae NOT DETECTED NOT DETECTED Final   Streptococcus pyogenes NOT DETECTED NOT DETECTED Final   Acinetobacter baumannii NOT DETECTED NOT DETECTED Final   Enterobacteriaceae species NOT DETECTED NOT DETECTED Final   Enterobacter cloacae complex NOT DETECTED NOT DETECTED Final   Escherichia coli NOT DETECTED NOT DETECTED Final   Klebsiella oxytoca NOT DETECTED NOT DETECTED Final   Klebsiella pneumoniae NOT DETECTED NOT DETECTED Final   Proteus species NOT DETECTED NOT DETECTED Final   Serratia marcescens NOT DETECTED NOT DETECTED Final   Haemophilus influenzae NOT DETECTED NOT DETECTED Final   Neisseria meningitidis NOT DETECTED NOT DETECTED Final   Pseudomonas aeruginosa NOT  DETECTED NOT DETECTED Final   Candida albicans NOT DETECTED NOT DETECTED Final   Candida glabrata NOT DETECTED NOT DETECTED Final   Candida krusei NOT DETECTED NOT DETECTED Final   Candida parapsilosis NOT DETECTED NOT DETECTED Final   Candida tropicalis NOT DETECTED NOT DETECTED Final    Comment: Performed at [facility], [address], [place], [place] [address]  Blood Culture (routine x 2)     Status: None   Collection Time: 10/04/18 10:35 PM  Result Value Ref Range Status   Specimen Description BLOOD LEFT ANTECUBITAL  Final   Special Requests   Final    BOTTLES DRAWN AEROBIC ONLY Blood Culture results may not be optimal due to an inadequate volume of blood received in culture bottles   Culture   Final    NO GROWTH 5 DAYS Performed at [facility], [address], [place], [place] [address]    Report Status 10/09/2018 FINAL  Final  Culture, blood (routine x 2)     Status: None   Collection Time: 10/06/18  2:37 AM  Result Value Ref Range Status   Specimen Description BLOOD RIGHT ANTECUBITAL  Final   Special Requests   Final    BOTTLES DRAWN AEROBIC ONLY Blood Culture results may not be optimal due to an inadequate volume of blood received in culture bottles   Culture   Final    NO GROWTH 5 DAYS Performed at [facility], [address], [place], [place] [address]    Report Status 10/11/2018 FINAL  Final  Culture, blood (routine x 2)     Status: None   Collection Time: 10/06/18  2:37 AM  Result Value Ref Range Status   Specimen Description BLOOD RIGHT ANTECUBITAL  Final   Special Requests   Final    BOTTLES DRAWN AEROBIC ONLY Blood Culture results may not be optimal due to an inadequate volume of blood received in culture bottles   Culture   Final    NO GROWTH 5 DAYS Performed at [facility], [address], [place], [place] [address]    Report Status 10/11/2018 FINAL  Final      Radiology Studies: No results found.      Scheduled Meds: .  enoxaparin (LOVENOX) injection  50 mg Subcutaneous Q24H  . insulin aspart  0-20 Units Subcutaneous TID WC  . nicotine  14  mg Transdermal Daily  . polyethylene glycol  17 g Oral BID  . QUEtiapine  400 mg Oral QHS  . senna-docusate  1 tablet Oral BID  . tamsulosin  0.4 mg Oral QPC supper  . traZODone  100 mg Oral QHS  . vitamin B-12  1,000 mcg Oral Daily   Continuous Infusions: . sodium chloride 10 mL/hr at 10/12/18 4098  .  ceFAZolin (ANCEF) IV 2 g (10/13/18 0539)    Assessment & Plan:    1.  Sepsis with MSSA/Streptococcus G bacteremia: Afebrile without leukocytosis.  Continue IV cefazolin while hospitalized and plan for transitioning to oral Keflex at the time of discharge with 2 weeks total course of antibiotics starting March 7.  Repeat blood cultures negative.  TEE on March 6 negative for vegetations.    2.  Post transmetatarsal left foot amputation/soft tissue infection: 2 weeks of antibiotics as discussed above.  Continue local wound care as suggested by orthopedics with dry dressing changes daily. Off load foot as much as possible and elevate as much as possible. [facility] did obtain temporary prosthesis for right transtibial amputation for patient to use upon discharge per orthopedics.  He will need to follow-up their office upon discharge.  3.  Diabetes mellitus type 2: Patient been on sliding scale insulin.  Blood glucose is fluctuating between 1 30-2 21.  Will resume metformin  4.  New onset atrial fibrillation: Present on admission.  Appears paroxysmal.  Rate controlled.  Seen by cardiology.  Echo showed EF of 60 to 65%.  Not felt to be candidate for long-term anticoagulation given persistent anemia and thrombocytopenia.  Patient to follow-up cardiology upon discharge.  5.  Depression/bipolar disorder.  Patient expressed suicidal ideation in the initial hospitalization and evaluated by psychiatry on March 7 who recommended supportive therapy and to continue Seroquel 400 mg  and trazodone 100 mg.  Patient has social stressors including homelessness.  He is currently worried about his monthly check.  Requested social worker to follow-up and assist patient for any needs.  6.  BPH: On Flomax  7.  Polysubstance abuse: Patient has history of tobacco and cocaine use.  On nicotine patch currently.  8.  Throat pain: Throat spray PRN/Magic mouthwash ordered.  Will defer strep throat work-up as patient already on antibiotics.  No clear evidence of thrush at this time.  DVT prophylaxis: Lovenox Code Status: Full code  family / Patient Communication: Discussed with patient and bedside nurse Disposition Plan: Awaiting skilled nursing facility placement but difficult given history of substance abuse and prior skilled nursing facility experience per social work.     LOS: 8 days    Time spent: 35 minutes    Alessandra Bevels, MD [facility] Pager [phone]  If 7PM-7AM, please contact night-coverage [email or web address] Password [email or web address] 10/13/2018, 1:15 PM

## 2018-10-13 NOTE — Social Work (Signed)
CSW team has assessed needs (please see assessment note from 3/9). Currently pt continues to have no SNF offers.  CSW continuing to follow for support with disposition.  Octavio Graves, MSW, [facility] Clinical Social Work [phone]

## 2018-10-13 NOTE — Progress Notes (Signed)
Changed dressing on Left foot this morning at 5am.

## 2018-10-14 LAB — GLUCOSE, CAPILLARY
Glucose-Capillary: 192 mg/dL — ABNORMAL HIGH (ref 70–99)
Glucose-Capillary: 131 mg/dL — ABNORMAL HIGH (ref 70–99)
Glucose-Capillary: 98 mg/dL (ref 70–99)
Glucose-Capillary: 120 mg/dL — ABNORMAL HIGH (ref 70–99)

## 2018-10-14 NOTE — Progress Notes (Signed)
Dressing on left foot was changed per order. Patient tolerated well. Will continue to monitor.

## 2018-10-14 NOTE — Progress Notes (Addendum)
PROGRESS NOTE    Cory Palmer  ZOX:[id number]  DOB: [birth year]  DOA: 10/04/2018 PCP: Patient, [name] Per  Brief Narrative:  58 year old male with history of right BKA, DM-2, bipolar disorder, cocaine use-presented with chest pain/fevers on 3/6 .He recently had his left foot partially amputated on 2/28, but complained of bleeding in the area after he walked. He was admitted with sepsis secondary to MSSA/strep bacteremia presumed secondary to left foot soft tissue infection but no osteomyelitis. Seen by ID and Orthopedics in this admission. Currently on IV antimicrobial therapy-awaiting SNF placement   Subjective:  Patient resting comfortably.  Feels better in terms of throat pain with Magic mouthwash   Objective: Vitals:   10/13/18 1330 10/13/18 2147 10/14/18 0553 10/14/18 1436  BP: 113/68 134/71 119/71 104/63  Pulse: 71 71 66 74  Resp: Temp: 97.6 F (36.4 C) 98 F (36.7 C) 97.8 F (36.6 C) 97.9 F (36.6 C)  TempSrc: Oral Oral Oral Oral  SpO2: 98% 96% 94% 95%  Weight:      Height:        Intake/Output Summary (Last 24 hours) at 10/14/2018 1606 Last data filed at 10/14/2018 1329 Gross per 24 hour  Intake 480 ml  Output 1275 ml  Net -795 ml   Filed Weights   10/07/18 0332 10/08/18 0302 10/09/18 0700  Weight: 109 kg 109.7 kg 104.5 kg    Physical Examination:  General exam: Appears calm and comfortable  ENT: Mild pharyngeal erythema, no evidence of thrush or tonsillar enlargement. Respiratory system: Clear to auscultation. Respiratory effort normal. Cardiovascular system: S1 & S2 heard, RRR. No JVD, murmurs, rubs, gallops or clicks. No pedal edema. Gastrointestinal system: Abdomen is nondistended, soft and nontender. No organomegaly or masses felt. Normal bowel sounds heard. Central nervous system: Alert and oriented. No focal neurological deficits. Extremities: Status post right BKA and dressing at the left foot amputation site Skin: No rashes, lesions or  ulcers Psychiatry:. Mood & affect anxious    Data Reviewed: I have personally reviewed following labs and imaging studies  CBC: Recent Labs  Lab 10/08/18 0508 10/09/18 0341 10/10/18 0742 10/13/18 0604  WBC 3.7* 4.5 6.1 4.8  NEUTROABS 2.0 2.4 3.6  --   HGB 8.9* 9.3* 10.0* 9.6*  HCT 26.7* 26.3* 29.5* 28.8*  MCV 93.7 92.6 93.9 95.0  PLT 99* 113* 122* 106*   Basic Metabolic Panel: Recent Labs  Lab 10/08/18 0508 10/09/18 0341 10/10/18 0742 10/12/18 0410 10/13/18 0604  NA 139 138 137  --  137  K 3.5 3.9 4.0  --  3.7  CL 111 107 108  --  108  CO2 21* 23 21*  --  19*  GLUCOSE 163* 186* 124*  --  156*  BUN --  13  CREATININE 0.78 0.74 0.75 0.86 0.76  CALCIUM 8.0* 8.5* 8.8*  --  9.0  MG 1.8 1.6* 1.8  --   --    GFR: Estimated Creatinine Clearance: 121.9 mL/min (by C-G formula based on SCr of 0.76 mg/dL). Liver Function Tests: Recent Labs  Lab 10/08/18 0508  AST 19  ALT 10  ALKPHOS 89  BILITOT 0.8  PROT 5.2*  ALBUMIN 2.4*   No results for input(s): LIPASE, AMYLASE in the last 168 hours. No results for input(s): AMMONIA in the last 168 hours. Coagulation Profile: No results for input(s): INR, PROTIME in the last 168 hours. Cardiac Enzymes: No results for input(s): CKTOTAL, CKMB, CKMBINDEX, TROPONINI in  the last 168 hours. BNP (last 3 results) No results for input(s): PROBNP in the last 8760 hours. HbA1C: No results for input(s): HGBA1C in the last 72 hours. CBG: Recent Labs  Lab 10/13/18 1152 10/13/18 1658 10/13/18 2145 10/14/18 0748 10/14/18 1202  GLUCAP 137* 166* 183* 192* 131*   Lipid Profile: No results for input(s): CHOL, HDL, LDLCALC, TRIG, CHOLHDL, LDLDIRECT in the last 72 hours. Thyroid Function Tests: No results for input(s): TSH, T4TOTAL, FREET4, T3FREE, THYROIDAB in the last 72 hours. Anemia Panel: No results for input(s): VITAMINB12, FOLATE, FERRITIN, TIBC, IRON, RETICCTPCT in the last 72 hours. Sepsis Labs: No results for  input(s): PROCALCITON, LATICACIDVEN in the last 168 hours.  Recent Results (from the past 240 hour(s))  Blood Culture (routine x 2)     Status: Abnormal   Collection Time: 10/04/18 10:34 PM  Result Value Ref Range Status   Specimen Description BLOOD LEFT HAND  Final   Special Requests   Final    BOTTLES DRAWN AEROBIC AND ANAEROBIC Blood Culture adequate volume   Culture  Setup Time   Final    GRAM POSITIVE COCCI AEROBIC BOTTLE ONLY CRITICAL RESULT CALLED TO, READ BACK BY AND VERIFIED WITH: PHRMD M PHAM @1356  10/05/2018 BY S GEZAHEGN Performed at [facility], [address] [place], [place] [address]    Culture STAPHYLOCOCCUS AUREUS STREPTOCOCCUS GROUP G  (A)  Final   Report Status 10/09/2018 FINAL  Final   Organism ID, Bacteria STAPHYLOCOCCUS AUREUS  Final   Organism ID, Bacteria STREPTOCOCCUS GROUP G  Final      Susceptibility   Staphylococcus aureus - MIC*    CIPROFLOXACIN <=0.5 SENSITIVE Sensitive     ERYTHROMYCIN >=8 RESISTANT Resistant     GENTAMICIN <=0.5 SENSITIVE Sensitive     OXACILLIN 0.5 SENSITIVE Sensitive     TETRACYCLINE <=1 SENSITIVE Sensitive     VANCOMYCIN <=0.5 SENSITIVE Sensitive     TRIMETH/SULFA <=10 SENSITIVE Sensitive     CLINDAMYCIN >=8 RESISTANT Resistant     RIFAMPIN <=0.5 SENSITIVE Sensitive     Inducible Clindamycin NEGATIVE Sensitive     * STAPHYLOCOCCUS AUREUS   Streptococcus group g - MIC*    CLINDAMYCIN >=1 RESISTANT Resistant     AMPICILLIN <=0.25 SENSITIVE Sensitive     ERYTHROMYCIN >=8 RESISTANT Resistant     VANCOMYCIN 0.25 SENSITIVE Sensitive     CEFTRIAXONE <=0.12 SENSITIVE Sensitive     LEVOFLOXACIN 0.5 SENSITIVE Sensitive     * STREPTOCOCCUS GROUP G  Blood Culture ID Panel (Reflexed)     Status: Abnormal   Collection Time: 10/04/18 10:34 PM  Result Value Ref Range Status   Enterococcus species NOT DETECTED NOT DETECTED Final   Listeria monocytogenes NOT DETECTED NOT DETECTED Final   Staphylococcus species DETECTED (A) NOT  DETECTED Final    Comment: CRITICAL RESULT CALLED TO, READ BACK BY AND VERIFIED WITH: PHRMD M PHAM @1356  10/05/2018 BY S GEZAHEGN    Staphylococcus aureus (BCID) DETECTED (A) NOT DETECTED Final    Comment: Methicillin (oxacillin) susceptible Staphylococcus aureus (MSSA). Preferred therapy is anti staphylococcal beta lactam antibiotic (Cefazolin or Nafcillin), unless clinically contraindicated. CRITICAL RESULT CALLED TO, READ BACK BY AND VERIFIED WITH: PHRMD M PHAM @1356  10/05/2018 BY S GEZAHEGN    Methicillin resistance NOT DETECTED NOT DETECTED Final   Streptococcus species DETECTED (A) NOT DETECTED Final    Comment: Not Enterococcus species, Streptococcus agalactiae, Streptococcus pyogenes, or Streptococcus pneumoniae. CRITICAL RESULT CALLED TO, READ BACK BY AND VERIFIED WITH: PHRMD  M PHAM @1356  10/05/2018 BY S GEZAHEGN    Streptococcus agalactiae NOT DETECTED NOT DETECTED Final   Streptococcus pneumoniae NOT DETECTED NOT DETECTED Final   Streptococcus pyogenes NOT DETECTED NOT DETECTED Final   Acinetobacter baumannii NOT DETECTED NOT DETECTED Final   Enterobacteriaceae species NOT DETECTED NOT DETECTED Final   Enterobacter cloacae complex NOT DETECTED NOT DETECTED Final   Escherichia coli NOT DETECTED NOT DETECTED Final   Klebsiella oxytoca NOT DETECTED NOT DETECTED Final   Klebsiella pneumoniae NOT DETECTED NOT DETECTED Final   Proteus species NOT DETECTED NOT DETECTED Final   Serratia marcescens NOT DETECTED NOT DETECTED Final   Haemophilus influenzae NOT DETECTED NOT DETECTED Final   Neisseria meningitidis NOT DETECTED NOT DETECTED Final   Pseudomonas aeruginosa NOT DETECTED NOT DETECTED Final   Candida albicans NOT DETECTED NOT DETECTED Final   Candida glabrata NOT DETECTED NOT DETECTED Final   Candida krusei NOT DETECTED NOT DETECTED Final   Candida parapsilosis NOT DETECTED NOT DETECTED Final   Candida tropicalis NOT DETECTED NOT DETECTED Final    Comment: Performed at [facility], [address] [place], [place] [address]  Blood Culture (routine x 2)     Status: None   Collection Time: 10/04/18 10:35 PM  Result Value Ref Range Status   Specimen Description BLOOD LEFT ANTECUBITAL  Final   Special Requests   Final    BOTTLES DRAWN AEROBIC ONLY Blood Culture results may not be optimal due to an inadequate volume of blood received in culture bottles   Culture   Final    NO GROWTH 5 DAYS Performed at [facility], [address] [place], [place] [address]    Report Status 10/09/2018 FINAL  Final  Culture, blood (routine x 2)     Status: None   Collection Time: 10/06/18  2:37 AM  Result Value Ref Range Status   Specimen Description BLOOD RIGHT ANTECUBITAL  Final   Special Requests   Final    BOTTLES DRAWN AEROBIC ONLY Blood Culture results may not be optimal due to an inadequate volume of blood received in culture bottles   Culture   Final    NO GROWTH 5 DAYS Performed at [facility], [address] [place], [place] [address]    Report Status 10/11/2018 FINAL  Final  Culture, blood (routine x 2)     Status: None   Collection Time: 10/06/18  2:37 AM  Result Value Ref Range Status   Specimen Description BLOOD RIGHT ANTECUBITAL  Final   Special Requests   Final    BOTTLES DRAWN AEROBIC ONLY Blood Culture results may not be optimal due to an inadequate volume of blood received in culture bottles   Culture   Final    NO GROWTH 5 DAYS Performed at [facility], [address], [place], [place] [address]    Report Status 10/11/2018 FINAL  Final      Radiology Studies: No results found.      Scheduled Meds: . enoxaparin (LOVENOX) injection  50 mg Subcutaneous Q24H  . insulin aspart  0-20 Units Subcutaneous TID WC  . magic mouthwash  10 mL Oral TID  . metFORMIN  1,000 mg Oral Q breakfast  . nicotine  14 mg Transdermal Daily  . polyethylene glycol  17 g Oral BID  . QUEtiapine  400 mg Oral QHS  . senna-docusate  1 tablet Oral  BID  . tamsulosin  0.4 mg Oral QPC supper  .  traZODone  100 mg Oral QHS  . vitamin B-12  1,000 mcg Oral Daily   Continuous Infusions: . sodium chloride 10 mL/hr at 10/12/18 [id number]  .  ceFAZolin (ANCEF) IV 2 g (10/14/18 1329)    Assessment & Plan:    1.  Sepsis with MSSA/Streptococcus G bacteremia: Afebrile without leukocytosis.  Continue IV cefazolin while hospitalized and plan for transitioning to oral Keflex at the time of discharge with 2 weeks total course of antibiotics starting March 7.  Repeat blood cultures negative.  TEE on March 6 negative for vegetations.    2.  Post transmetatarsal left foot amputation/soft tissue infection: 2 weeks of antibiotics as discussed above.  Continue local wound care as suggested by orthopedics with dry dressing changes daily. Off load foot as much as possible and elevate as much as possible. [facility] did obtain temporary prosthesis for right transtibial amputation for patient to use upon discharge per orthopedics.  He will need to follow-up their office upon discharge.  3.  Diabetes mellitus type 2: Patient been on sliding scale insulin.  Metformin 3/ 14 and blood glucose better  4.  New onset atrial fibrillation: Present on admission.  Appears paroxysmal.  Rate controlled.  Seen by cardiology.  Echo showed EF of 60 to 65%.  Not felt to be candidate for long-term anticoagulation given persistent anemia and thrombocytopenia.  Patient to follow-up cardiology upon discharge.  5.  Depression/bipolar disorder.  Patient expressed suicidal ideation in the initial hospitalization and evaluated by psychiatry on March 7 who recommended supportive therapy and to continue Seroquel 400 mg and trazodone 100 mg.  Patient has social stressors including homelessness.  He is currently worried about his monthly check.  Requested social worker to follow-up and assist patient for any needs.  6.  BPH: On Flomax  7.  Polysubstance abuse: Patient has history of tobacco and  cocaine use.  On nicotine patch currently.  8.  Throat pain: Throat spray PRN/Magic mouthwash ordered.  Will defer strep throat work-up as patient already on antibiotics.  No clear evidence of thrush at this time.  DVT prophylaxis: Lovenox Code Status: Full code  family / Patient Communication: Discussed with patient and bedside nurse Disposition Plan: Awaiting skilled nursing facility placement but difficult given history of substance abuse and prior skilled nursing facility experience per social work.     LOS: 9 days    Time spent: 25 minutes    Alessandra BevelsNeelima Laurrie Toppin, MD [facility] Pager [phone]  If 7PM-7AM, please contact night-coverage [email or web address] Password [email or web address] 10/14/2018, 4:06 PM

## 2018-10-14 NOTE — Progress Notes (Signed)
Dressing on Left foot changed at 545am.

## 2018-10-15 DIAGNOSIS — I159 Secondary hypertension, unspecified: Secondary | ICD-10-CM

## 2018-10-15 LAB — GLUCOSE, CAPILLARY
GLUCOSE-CAPILLARY: 107 mg/dL — AB (ref 70–99)
Glucose-Capillary: 134 mg/dL — ABNORMAL HIGH (ref 70–99)
Glucose-Capillary: 129 mg/dL — ABNORMAL HIGH (ref 70–99)
Glucose-Capillary: 133 mg/dL — ABNORMAL HIGH (ref 70–99)
Glucose-Capillary: 130 mg/dL — ABNORMAL HIGH (ref 70–99)

## 2018-10-15 NOTE — Progress Notes (Signed)
Physical Therapy Treatment Patient Details Name: Cory Palmer MRN: [id number] DOB: 08-12-1960 Today's Date: 10/15/2018    History of Present Illness 58 yo male with onset of sepsis and chest pain was admitted, is homeless and has most recently been in a hotel with a couple.  Pt has unclear instructions about WB on LLE, no shoe and no supplies for use of R prosthesis for BK amputation.  Pt also reports prosthesis is scheduled for reconstruction.  PMHx:  L transmet amputation, bipolar depression, anxiety, CHF, DM, HTN,     PT Comments    Pt supine in bed on arrival.  He was eager to get out of bed to sit in chair at sink to shave.  Pt assisted to donn sleeve and temporary prosthesis.  He was instructed and educated on the importance of NWB.  He is non receptive to this education and is unable to maintain during transfers.  Pt continues to require placement at d/c to improve strength and function.  Plan next session to focus on strengthening and lateral scoot transfers.      Follow Up Recommendations  SNF     Equipment Recommendations  Wheelchair (measurements PT);Wheelchair cushion (measurements PT)    Recommendations for Other Services       Precautions / Restrictions Precautions Precautions: Fall Required Braces or Orthoses: Other Brace Other Brace: post op shoe for LLE, RLE temporary prosthesis.   Restrictions Weight Bearing Restrictions: Yes LLE Weight Bearing: Non weight bearing Other Position/Activity Restrictions: strict NWB per MD note, pt is unable to maintain.      Mobility  Bed Mobility Overal bed mobility: Modified Independent             General bed mobility comments: takes extra time but can self mobiliize  Transfers Overall transfer level: Needs assistance Equipment used: None Transfers: Squat Pivot Transfers     Squat pivot transfers: Min assist     General transfer comment: Pt performed partial sit to stand to pivot from bed to recliner.  He was  instructed to maintain NWB but unable to and clearly bearing weight despite cueing.  Further transfer training deferred due to safety.  He may need to continue to perform lateral scoots to maintain NWB.    Ambulation/Gait Ambulation/Gait assistance: (NT)               Stairs             Wheelchair Mobility    Modified Rankin (Stroke Patients Only)       Balance Overall balance assessment: Needs assistance   Sitting balance-Leahy Scale: Good       Standing balance-Leahy Scale: Poor                              Cognition Arousal/Alertness: Awake/alert Behavior During Therapy: Anxious Overall Cognitive Status: No family/caregiver present to determine baseline cognitive functioning                                 General Comments: decreased safety awareness      Exercises      General Comments        Pertinent Vitals/Pain Pain Assessment: Faces Faces Pain Scale: Hurts a little bit Pain Location: L leg  Pain Descriptors / Indicators: Sore Pain Intervention(s): Limited activity within patient's tolerance    Home Living  Prior Function            PT Goals (current goals can now be found in the care plan section) Acute Rehab PT Goals Patient Stated Goal: to be able to live alone Potential to Achieve Goals: Fair Progress towards PT goals: Progressing toward goals    Frequency    Min 2X/week      PT Plan Current plan remains appropriate    Co-evaluation              AM-PAC PT "6 Clicks" Mobility   Outcome Measure  Help needed turning from your back to your side while in a flat bed without using bedrails?: None Help needed moving from lying on your back to sitting on the side of a flat bed without using bedrails?: None Help needed moving to and from a bed to a chair (including a wheelchair)?: A Little Help needed standing up from a chair using your arms (e.g., wheelchair or  bedside chair)?: A Little Help needed to walk in hospital room?: Total Help needed climbing 3-5 steps with a railing? : Total 6 Click Score: 16    End of Session Equipment Utilized During Treatment: Gait belt Activity Tolerance: Patient tolerated treatment well Patient left: in chair;with chair alarm set;with call bell/phone within reach(Pt sitting at sink.  ) Nurse Communication: Mobility status PT Visit Diagnosis: Other abnormalities of gait and mobility (R26.89) Pain - Right/Left: Left Pain - part of body: Leg;Ankle and joints of foot     Time: 8110-3159 PT Time Calculation (min) (ACUTE ONLY): 16 min  Charges:  $Therapeutic Activity: 8-22 mins                     Joycelyn Rua, PTA Acute Rehabilitation Services Pager [phone] Office [phone]     Ishani Goldwasser Artis Delay 10/15/2018, 10:13 AM

## 2018-10-15 NOTE — TOC Progression Note (Signed)
Transition of Care [facility]) - Progression Note    Patient Details  Name: Cory Palmer MRN: [id number] Date of Birth: [birth year]  [facility] CM/SW Contact  Mearl Latin, [place] Phone Number: [phone] 10/15/2018, 9:45 AM  Clinical Narrative:     CSW spoke with patient. He reported that he spoke with [facility] and they are able to assist him in finding an apartment. He states that he does not want to go to a nursing facility for rehab and that he can get around now that he has his prosthetic. He reports that he will need to go to the bank to get money out.  CSW followed up with [facility]. She will send CSW apartment listings that patient will have to follow up with.      Expected Discharge Plan and Services            Social Determinants of Health (SDOH) Interventions    Readmission Risk Interventions 30 Day Unplanned Readmission Risk Score     ED to Hosp-Admission (Current) from 10/04/2018 in [place] [facility]  30 Day Unplanned Readmission Risk Score (%)  20 Filed at 10/15/2018 0801     This score is the patient's risk of an unplanned readmission within 30 days of being discharged (0 -100%). The score is based on dignosis, age, lab data, medications, orders, and past utilization.   Low:  0-14.9   Medium: 15-21.9   High: 22-29.9   Extreme: 30 and above       No flowsheet data found.

## 2018-10-15 NOTE — Progress Notes (Signed)
PROGRESS NOTE    Cory Palmer  MSX:[id number] DOB: 1961-02-16 DOA: 10/04/2018 PCP: Patient, [name] Per   Brief Narrative:  58 year old with history of diabetes mellitus type 2, bipolar disorder, right-sided BKA, cocaine use presented to the hospital on 3/6 with complaints of chest discomfort and fever.  Patient recently had left foot partially amputated on 2/28.  He was admitted for sepsis and eventually ended up growing MSSA/strep bacteremia.  No osteomyelitis was noted.  Infectious disease and orthopedic were consulted and it was recommended to continue IV Ancef during the hospitalization which could be switched to Keflex with plans of 2 weeks of total antibiotics.   Assessment & Plan:   Principal Problem:   Sepsis without acute organ dysfunction (HCC) Active Problems:   Homelessness   Diabetes mellitus type 2, noninsulin dependent (HCC)   S/P transmetatarsal amputation of foot, left (HCC)   Depressed bipolar disorder (HCC)   Hypertension   Hyperbilirubinemia   Bacteremia due to Gram-positive bacteria   Hypokalemia   Hypomagnesemia   PAF (paroxysmal atrial fibrillation) (HCC)   Hx of BKA, right (HCC)   Streptococcus/MSSA bacteremia secondary to infection of left foot status post transmetatarsal amputation - Blood culture data has been reviewed.  Repeat cultures have been negative.  TEE performed on 10/05/2018 is also negative for evidence of endocarditis -Continue IV Ancef with plans to transition to Keflex upon discharge.  Last day of antibiotic 10/19/2018.  Appreciate input from infectious disease. - Local wound care per nursing staff but have advised them to consult wound care team as it becomes necessary.  Diabetes mellitus type 2 -Currently on metformin thousand milligrams daily.  Insulin sliding scale and Accu-Cheks.  Pre-meal insulin ordered.  New onset atrial fibrillation   History of depression/bipolar disorder - Continue trazodone 100 mg at bedtime, Seroquel for 100  mg at bedtime.  Substance abuse. -Counseled to quit using illicit drugs.  He understands this.  BPH -Flomax  DVT prophylaxis: Lovenox  code Status: Full code Family Communication: None at bedside Disposition Plan: Currently awaiting placement at skilled nursing facility but this is an issue given his previous drug use  Consultants:   Infectious disease  Orthopedic  Procedures:   None  Antimicrobials:   Currently on Ancef with plans to transition to Keflex.  Last day of antibiotics would be 3/20   Subjective: Patient reports of slight bleeding from the left foot surgical site and dressing appears to be slightly erythematous.  No other complaints.  Review of Systems Otherwise negative except as per HPI, including: General: Denies fever, chills, night sweats or unintended weight loss. Resp: Denies cough, wheezing, shortness of breath. Cardiac: Denies chest pain, palpitations, orthopnea, paroxysmal nocturnal dyspnea. GI: Denies abdominal pain, nausea, vomiting, diarrhea or constipation GU: Denies dysuria, frequency, hesitancy or incontinence MS: Denies muscle aches, joint pain or swelling Neuro: Denies headache, neurologic deficits (focal weakness, numbness, tingling), abnormal gait Psych: Denies anxiety, depression, SI/HI/AVH Skin: Slight bleeding noted of his left foot around the recent amputation site. ID: Denies sick contacts, exotic exposures, travel  Objective: Vitals:   10/14/18 0553 10/14/18 1436 10/14/18 2114 10/15/18 0521  BP: 119/71 104/63 135/67 (!) 96/53  Pulse: 66 74 66 66  Resp: 18     Temp: 97.8 F (36.6 C) 97.9 F (36.6 C) 98 F (36.7 C) 98 F (36.7 C)  TempSrc: Oral Oral Oral Oral  SpO2: 94% 95% 99% 95%  Weight:      Height:  Intake/Output Summary (Last 24 hours) at 10/15/2018 1031 Last data filed at 10/15/2018 0706 Gross per 24 hour  Intake 160 ml  Output 1950 ml  Net -1790 ml   Filed Weights   10/07/18 0332 10/08/18 0302  10/09/18 0700  Weight: 109 kg 109.7 kg 104.5 kg    Examination:  General exam: Appears calm and comfortable  Respiratory system: Clear to auscultation. Respiratory effort normal. Cardiovascular system: S1 & S2 heard, RRR. No JVD, murmurs, rubs, gallops or clicks. No pedal edema. Gastrointestinal system: Abdomen is nondistended, soft and nontender. No organomegaly or masses felt. Normal bowel sounds heard. Central nervous system: Alert and oriented. No focal neurological deficits. Extremities: Limited range of motion of his right shoulder due to chronic arthritic issues.  Right-sided BKA noted, left lower extremity amputation noted with dressing in place.  Dressing appears to be slightly bloody. Skin: No rashes, lesions or ulcers Psychiatry: Judgement and insight appear normal. Mood & affect appropriate.     Data Reviewed:   CBC: Recent Labs  Lab 10/09/18 0341 10/10/18 0742 10/13/18 0604  WBC 4.5 6.1 4.8  NEUTROABS 2.4 3.6  --   HGB 9.3* 10.0* 9.6*  HCT 26.3* 29.5* 28.8*  MCV 92.6 93.9 95.0  PLT 113* 122* 106*   Basic Metabolic Panel: Recent Labs  Lab 10/09/18 0341 10/10/18 0742 10/12/18 0410 10/13/18 0604  NA 138 137  --  137  K 3.9 4.0  --  3.7  CL 107 108  --  108  CO2 23 21*  --  19*  GLUCOSE 186* 124*  --  156*  BUN 8 9  --  13  CREATININE 0.74 0.75 0.86 0.76  CALCIUM 8.5* 8.8*  --  9.0  MG 1.6* 1.8  --   --    GFR: Estimated Creatinine Clearance: 121.9 mL/min (by C-G formula based on SCr of 0.76 mg/dL). Liver Function Tests: No results for input(s): AST, ALT, ALKPHOS, BILITOT, PROT, ALBUMIN in the last 168 hours. No results for input(s): LIPASE, AMYLASE in the last 168 hours. No results for input(s): AMMONIA in the last 168 hours. Coagulation Profile: No results for input(s): INR, PROTIME in the last 168 hours. Cardiac Enzymes: No results for input(s): CKTOTAL, CKMB, CKMBINDEX, TROPONINI in the last 168 hours. BNP (last 3 results) No results for  input(s): PROBNP in the last 8760 hours. HbA1C: No results for input(s): HGBA1C in the last 72 hours. CBG: Recent Labs  Lab 10/14/18 0748 10/14/18 1202 10/14/18 1714 10/14/18 2032 10/15/18 0757  GLUCAP 192* 131* 98 120* 134*   Lipid Profile: No results for input(s): CHOL, HDL, LDLCALC, TRIG, CHOLHDL, LDLDIRECT in the last 72 hours. Thyroid Function Tests: No results for input(s): TSH, T4TOTAL, FREET4, T3FREE, THYROIDAB in the last 72 hours. Anemia Panel: No results for input(s): VITAMINB12, FOLATE, FERRITIN, TIBC, IRON, RETICCTPCT in the last 72 hours. Sepsis Labs: No results for input(s): PROCALCITON, LATICACIDVEN in the last 168 hours.  Recent Results (from the past 240 hour(s))  Culture, blood (routine x 2)     Status: None   Collection Time: 10/06/18  2:37 AM  Result Value Ref Range Status   Specimen Description BLOOD RIGHT ANTECUBITAL  Final   Special Requests   Final    BOTTLES DRAWN AEROBIC ONLY Blood Culture results may not be optimal due to an inadequate volume of blood received in culture bottles   Culture   Final    NO GROWTH 5 DAYS Performed at [facility], [address] [place], [place] [address]    Report Status 10/11/2018 FINAL  Final  Culture, blood (routine x 2)     Status: None   Collection Time: 10/06/18  2:37 AM  Result Value Ref Range Status   Specimen Description BLOOD RIGHT ANTECUBITAL  Final   Special Requests   Final    BOTTLES DRAWN AEROBIC ONLY Blood Culture results may not be optimal due to an inadequate volume of blood received in culture bottles   Culture   Final    NO GROWTH 5 DAYS Performed at [facility], [address], [place], [place] [address]    Report Status 10/11/2018 FINAL  Final         Radiology Studies: No results found.      Scheduled Meds: . enoxaparin (LOVENOX) injection  50 mg Subcutaneous Q24H  . insulin aspart  0-20 Units Subcutaneous TID WC  . magic mouthwash  10 mL Oral TID  . metFORMIN   1,000 mg Oral Q breakfast  . nicotine  14 mg Transdermal Daily  . polyethylene glycol  17 g Oral BID  . QUEtiapine  400 mg Oral QHS  . senna-docusate  1 tablet Oral BID  . tamsulosin  0.4 mg Oral QPC supper  . traZODone  100 mg Oral QHS  . vitamin B-12  1,000 mcg Oral Daily   Continuous Infusions: . sodium chloride 10 mL/hr at 10/12/18 2330  .  ceFAZolin (ANCEF) IV Stopped (10/15/18 0705)     LOS: 10 days   Time spent= 20 mins    Keen Ewalt Joline Maxcy, MD [facility]  If 7PM-7AM, please contact night-coverage [email or web address] 10/15/2018, 10:31 AM

## 2018-10-15 NOTE — Telephone Encounter (Signed)
Thank you, will inform Dr [name]

## 2018-10-16 ENCOUNTER — Telehealth (INDEPENDENT_AMBULATORY_CARE_PROVIDER_SITE_OTHER): Payer: Self-pay | Admitting: Orthopedic Surgery

## 2018-10-16 LAB — GLUCOSE, CAPILLARY
Glucose-Capillary: 133 mg/dL — ABNORMAL HIGH (ref 70–99)
Glucose-Capillary: 128 mg/dL — ABNORMAL HIGH (ref 70–99)
Glucose-Capillary: 159 mg/dL — ABNORMAL HIGH (ref 70–99)
Glucose-Capillary: 136 mg/dL — ABNORMAL HIGH (ref 70–99)

## 2018-10-16 MED ORDER — TAMSULOSIN HCL 0.4 MG PO CAPS: 0.4000 mg | ORAL_CAPSULE | Freq: Every day | ORAL | 0 refills | Status: DC

## 2018-10-16 MED ORDER — CEPHALEXIN 500 MG PO CAPS: 500.0000 mg | ORAL_CAPSULE | Freq: Four times a day (QID) | ORAL | 0 refills | Status: DC

## 2018-10-16 NOTE — Telephone Encounter (Signed)
Pt called stating he was still bleeding through his gauze.  I sent pt to triage.

## 2018-10-16 NOTE — Telephone Encounter (Signed)
I have tried to call the pt and a message states that the number is temporarily out of service. I am unable to reach the pt at this moment. Per Dr. [name] ok to discharge and should follow up with [place] in the office on Thursday. Change the dressing everyday and we will eval at that time.  I will continue to try and reach the pt
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
## 2018-10-16 NOTE — TOC Progression Note (Signed)
Transition of Care [facility]) - Progression Note    Patient Details  Name: Cory Palmer MRN: [id number] Date of Birth: 1961-07-06  [facility]) CM/SW Contact  Epifanio Lesches, RN Phone Number: 10/16/2018, 11:53 AM  Clinical Narrative:     NCM along with CSW spoke with CSW AD regarding  pt's transition of care needs. Pt he states has no where to go( homeless),no friends or family to assist or help him once d/c. States he has no access to money until next month. CSW AD advised NCM and CSW to reach out to [facility]) SNF for potential placement. CSW to f/u.  Expected Discharge Date: 10/16/18                   Social Determinants of Health (SDOH) Interventions    Readmission Risk Interventions 30 Day Unplanned Readmission Risk Score     ED to Hosp-Admission (Current) from 10/04/2018 in [place] [facility]  30 Day Unplanned Readmission Risk Score (%)  19 Filed at 10/16/2018 0801     This score is the patient's risk of an unplanned readmission within 30 days of being discharged (0 -100%). The score is based on dignosis, age, lab data, medications, orders, and past utilization.   Low:  0-14.9   Medium: 15-21.9   High: 22-29.9   Extreme: 30 and above       No flowsheet data found.

## 2018-10-16 NOTE — Discharge Summary (Addendum)
Physician Discharge Summary  Cory Palmer [id number] DOB: 02-May-1961 DOA: 10/04/2018  PCP: Patient, [name] Per  Admit date: 10/04/2018 Discharge date: 10/18/18 Admitted From: Home Disposition: Home  Recommendations for Outpatient Follow-up:  1. Follow up with PCP in 1-2 weeks, if [name] then community wellness center. 2. Please obtain BMP/CBC in one week your next doctors visit.  3. Patient needs oral Keflex until October 27, 2018.  Outpatient follow-up appointments have been made.  He will also need to follow-up with community wellness center.  Discharge Condition: Stable CODE STATUS: Full code Diet recommendation: 2 g salt diet  Brief/Interim Summary: 58 year old with history of diabetes mellitus type 2, bipolar disorder, right-sided BKA, cocaine use presented to the hospital on 3/6 with complaints of chest discomfort and fever.  Patient recently had left foot partially amputated on 2/28.  He was admitted for sepsis and eventually ended up growing MSSA/strep bacteremia.  No osteomyelitis was noted.  Infectious disease and orthopedic were consulted and it was recommended to continue IV Ancef during the hospitalization which could be switched to Keflex with plans of 2 weeks of total antibiotics.  Last day of antibiotics is October 27, 2018.  TEE performed 10/05/2018 was negative for any evidence of endocarditis. Otherwise patient has reached max benefit from hospital stay and stable for discharge.  ADDENDUM 3/19: No change since his discharge summary was done on 3/17.  Patient can still be discharged today. 2 more days of oral Keflex.    Discharge Diagnoses:  Principal Problem:   Sepsis without acute organ dysfunction (HCC) Active Problems:   Homelessness   Diabetes mellitus type 2, noninsulin dependent (HCC)   S/P transmetatarsal amputation of foot, left (HCC)   Depressed bipolar disorder (HCC)   Hypertension   Hyperbilirubinemia   Bacteremia due to Gram-positive bacteria    Hypokalemia   Hypomagnesemia   PAF (paroxysmal atrial fibrillation) (HCC)   Hx of BKA, right (HCC)  Streptococcus/MSSA bacteremia secondary to infection of left foot status post transmetatarsal amputation Sepsis present on admission. - Blood cultures thus far remain negative.  TEE performed on 10/05/2018 is also negative for evidence of endocarditis -IV Ancef while in the hospital will transition to oral Keflex at discharge.  Last day of antibiotic 10/19/2018.  Appreciate input from infectious disease. - Local wound care per nursing staff but have advised them to consult wound care team as it becomes necessary.  Needs to follow-up outpatient with Dr. [name] and primary care provider/community wellness center  Diabetes mellitus type 2 -Resume home metformin  New onset atrial fibrillation, brief episode.  Likely paroxysmal.  Seen by cardiology.  Ejection fraction 60 to 65%.  Not a candidate for long-term anticoagulation due to anemia/thrombocytopenia.  Can follow-up outpatient with cardiology.  History of depression/bipolar disorder - Continue trazodone 100 mg at bedtime, Seroquel for 100 mg at bedtime.  Substance abuse. -Counseled to quit using illicit drugs.  He understands this.  BPH -Flomax  DVT prophylaxis: Lovenox  code Status: Full code Family Communication: None at bedside Disposition Plan:  Discharge when there is disposition plan available.  Consultations:  Cardiology  Infectious disease  Subjective: Remains afebrile.  No new complaints.  Discharge Exam: Vitals:   10/15/18 2209 10/16/18 0516  BP: 132/76 122/68  Pulse: 78 66  Resp:    Temp: 99 F (37.2 C) 98.2 F (36.8 C)  SpO2: 94% 95%   Vitals:   10/15/18 0521 10/15/18 1417 10/15/18 2209 10/16/18 0516  BP: (!) 96/53 108/68 132/76 122/68  Pulse: 66 73 78 66  Resp:  16    Temp: 98 F (36.7 C) 98.4 F (36.9 C) 99 F (37.2 C) 98.2 F (36.8 C)  TempSrc: Oral Oral Oral Oral  SpO2: 95% 97% 94% 95%   Weight:      Height:        General: Pt is alert, awake, not in acute distress Cardiovascular: RRR, S1/S2 +, no rubs, no gallops Respiratory: CTA bilaterally, no wheezing, no rhonchi Abdominal: Soft, NT, ND, bowel sounds + Extremities: Limited range of motion of his right shoulder due to chronic arthritic issue.  Right-sided BKA noted, left lower extremity amputation with dressing in place.  Discharge Instructions  Discharge Instructions    Change dressing   Complete by:  As directed    Dry dressing change left foot daily.   Diet - low sodium heart healthy   Complete by:  As directed    Carb modified   Increase activity slowly   Complete by:  As directed    Non weight bearing   Complete by:  As directed    Laterality:  left   Extremity:  Lower     Allergies as of 10/16/2018   No Known Allergies     Medication List    STOP taking these medications   doxycycline 100 MG capsule Commonly known as:  VIBRAMYCIN     TAKE these medications   acetaminophen 325 MG tablet Commonly known as:  TYLENOL Take 2 tablets (650 mg total) by mouth every 6 (six) hours as needed for mild pain (or Fever >/= 101).   cephALEXin 500 MG capsule Commonly known as:  KEFLEX Take 1 capsule (500 mg total) by mouth 4 (four) times daily for 3 days.   feeding supplement (GLUCERNA SHAKE) Liqd Take 237 mLs by mouth 2 (two) times daily between meals.   metFORMIN 1000 MG tablet Commonly known as:  GLUCOPHAGE Take 1 tablet (1,000 mg total) by mouth 2 (two) times daily with a meal.   multivitamin with minerals Tabs tablet Take 1 tablet by mouth daily.   QUEtiapine 400 MG tablet Commonly known as:  SEROquel Take 1 tablet (400 mg total) by mouth at bedtime.   tamsulosin 0.4 MG Caps capsule Commonly known as:  FLOMAX Take 1 capsule (0.4 mg total) by mouth daily after supper for 30 days.   traZODone 100 MG tablet Commonly known as:  DESYREL Take 1 tablet (100 mg total) by mouth at bedtime.             Discharge Care Instructions  (From admission, onward)         Start     Ordered   10/09/18 0000  Change dressing    Comments:  Dry dressing change left foot daily.   10/09/18 0746   10/09/18 0000  Non weight bearing    Question Answer Comment  Laterality left   Extremity Lower      10/09/18 0746         Follow-up Information    Chrystie Nose, MD Follow up in 1 month(s).   Specialty:  Cardiology Why:  afib Contact information: [address] [place] [address] [phone]        Nadara Mustard, MD Follow up in 1 week(s).   Specialty:  Orthopedic Surgery Why:  wound care, left foot Contact information: [address] [place] [address] [phone]        [facility]. Schedule  an appointment as soon as possible for a visit in 1 week(s).   Contact information: [address] [place] [id number] [phone]         No Known Allergies  You were cared for by a hospitalist during your hospital stay. If you have any questions about your discharge medications or the care you received while you were in the hospital after you are discharged, you can call the unit and asked to speak with the hospitalist on call if the hospitalist that took care of you is not available. Once you are discharged, your primary care physician will handle any further medical issues. Please note that no refills for any discharge medications will be authorized once you are discharged, as it is imperative that you return to your primary care physician (or establish a relationship with a primary care physician if you do not have one) for your aftercare needs so that they can reassess your need for medications and monitor your lab values.   Procedures/Studies: Dg Chest 2 View  Result Date: 10/04/2018 CLINICAL DATA:  Cough for 4 days EXAM: CHEST - 2 VIEW COMPARISON:  05/08/2018 FINDINGS: Mild  cardiomegaly. No overt pulmonary edema. Both lungs are clear. The visualized skeletal structures are unremarkable. IMPRESSION: No active cardiopulmonary disease.  Mild cardiomegaly Electronically Signed   By: Jasmine PangKim  Fujinaga M.D.   On: 10/04/2018 22:52   Dg Foot Complete Left  Result Date: 10/05/2018 CLINICAL DATA:  Recent foot surgery possible wound infection. EXAM: LEFT FOOT - COMPLETE 3+ VIEW COMPARISON:  Preoperative radiograph 09/24/2018 FINDINGS: Post transmetatarsal amputation of foot. The resection margins are sharp. No periosteal reaction or bony destructive change. Three linear foreign bodies projecting over the foot are unchanged prior exam, likely in the soft tissues, less likely external to the patient given persistence on pre and postoperative imaging. No tracking soft tissue air. Overlying dressing in place. IMPRESSION: Post transmetatarsal amputation of the foot. No radiographic findings of osteomyelitis. Electronically Signed   By: Narda RutherfordMelanie  Sanford M.D.   On: 10/05/2018 00:04   Xr Foot Complete Left  Result Date: 09/24/2018 3 view radiographs of the left foot shows a previous ray amputation of the first second and third metatarsals.  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  There are multiple retained needles within the soft tissue.  [place]as [place] Lower Extremity Venous (dvt)  Result Date: 10/09/2018  Lower Venous Study Indications: Edema.  Performing Technologist: Blanch MediaMegan Riddle RVS  Examination Guidelines: A complete evaluation includes B-mode imaging, spectral Doppler, color Doppler, and power Doppler as needed of all accessible portions of each vessel. Bilateral testing is considered an integral part of a complete examination. Limited examinations for reoccurring indications may be performed as noted.  Right Venous Findings: +---+---------------+---------+-----------+----------+-------+     CompressibilityPhasicitySpontaneityPropertiesSummary +---+---------------+---------+-----------+----------+-------+ CFVFull           Yes      Yes                          +---+---------------+---------+-----------+----------+-------+  Left Venous Findings: +---------+---------------+---------+-----------+----------+--------------+          CompressibilityPhasicitySpontaneityPropertiesSummary        +---------+---------------+---------+-----------+----------+--------------+ CFV      Full           Yes      Yes                                 +---------+---------------+---------+-----------+----------+--------------+  SFJ      Full                                                        +---------+---------------+---------+-----------+----------+--------------+ FV Prox  Full                                                        +---------+---------------+---------+-----------+----------+--------------+ FV Mid   Full                                                        +---------+---------------+---------+-----------+----------+--------------+ FV DistalFull                                                        +---------+---------------+---------+-----------+----------+--------------+ PFV      Full                                                        +---------+---------------+---------+-----------+----------+--------------+ POP      Full           Yes      Yes                                 +---------+---------------+---------+-----------+----------+--------------+ PTV      Full                                                        +---------+---------------+---------+-----------+----------+--------------+ PERO                                                  Not visualized +---------+---------------+---------+-----------+----------+--------------+    Summary: Right: No evidence of common femoral vein obstruction. Left: There is  no evidence of deep vein thrombosis in the lower extremity. No cystic structure found in the popliteal fossa.  *See table(s) above for measurements and observations. Electronically signed by Waverly Ferrari MD on 10/09/2018 at 4:27:36 PM.    Final       The results of significant diagnostics from this hospitalization (including imaging, microbiology, ancillary and laboratory) are listed below for reference.     Microbiology: No results found for this or any previous visit (from the past 240 hour(s)).   Labs: BNP (last 3 results) No results for input(s): BNP in the last 8760 hours. Basic Metabolic Panel: Recent  Labs  Lab 10/10/18 0742 10/12/18 0410 10/13/18 0604  NA 137  --  137  K 4.0  --  3.7  CL 108  --  108  CO2 21*  --  19*  GLUCOSE 124*  --  156*  BUN 9  --  13  CREATININE 0.75 0.86 0.76  CALCIUM 8.8*  --  9.0  MG 1.8  --   --    Liver Function Tests: No results for input(s): AST, ALT, ALKPHOS, BILITOT, PROT, ALBUMIN in the last 168 hours. No results for input(s): LIPASE, AMYLASE in the last 168 hours. No results for input(s): AMMONIA in the last 168 hours. CBC: Recent Labs  Lab 10/10/18 0742 10/13/18 0604  WBC 6.1 4.8  NEUTROABS 3.6  --   HGB 10.0* 9.6*  HCT 29.5* 28.8*  MCV 93.9 95.0  PLT 122* 106*   Cardiac Enzymes: No results for input(s): CKTOTAL, CKMB, CKMBINDEX, TROPONINI in the last 168 hours. BNP: Invalid input(s): POCBNP CBG: Recent Labs  Lab 10/15/18 0757 10/15/18 1204 10/15/18 1606 10/15/18 2210 10/16/18 0739  GLUCAP 134* 129* 133* 130* 133*   D-Dimer No results for input(s): DDIMER in the last 72 hours. Hgb A1c No results for input(s): HGBA1C in the last 72 hours. Lipid Profile No results for input(s): CHOL, HDL, LDLCALC, TRIG, CHOLHDL, LDLDIRECT in the last 72 hours. Thyroid function studies No results for input(s): TSH, T4TOTAL, T3FREE, THYROIDAB in the last 72 hours.  Invalid input(s): FREET3 Anemia work up No results for  input(s): VITAMINB12, FOLATE, FERRITIN, TIBC, IRON, RETICCTPCT in the last 72 hours. Urinalysis    Component Value Date/Time   COLORURINE AMBER (A) 10/05/2018 1153   APPEARANCEUR CLEAR 10/05/2018 1153   LABSPEC 1.034 (H) 10/05/2018 1153   PHURINE 5.0 10/05/2018 1153   GLUCOSEU NEGATIVE 10/05/2018 1153   HGBUR NEGATIVE 10/05/2018 1153   BILIRUBINUR SMALL (A) 10/05/2018 1153   KETONESUR 5 (A) 10/05/2018 1153   PROTEINUR 30 (A) 10/05/2018 1153   NITRITE NEGATIVE 10/05/2018 1153   LEUKOCYTESUR NEGATIVE 10/05/2018 1153   Sepsis Labs Invalid input(s): PROCALCITONIN,  WBC,  LACTICIDVEN Microbiology No results found for this or any previous visit (from the past 240 hour(s)).   Time coordinating discharge:  I have spent 35 minutes face to face with the patient and on the ward discussing the patients care, assessment, plan and disposition with other care givers. >50% of the time was devoted counseling the patient about the risks and benefits of treatment/Discharge disposition and coordinating care.   SIGNED:   Dimple Nanas, MD  [facility] 10/16/2018, 10:23 AM   If 7PM-7AM, please contact night-coverage [email or web address]

## 2018-10-17 LAB — GLUCOSE, CAPILLARY
Glucose-Capillary: 240 mg/dL — ABNORMAL HIGH (ref 70–99)
Glucose-Capillary: 156 mg/dL — ABNORMAL HIGH (ref 70–99)
Glucose-Capillary: 122 mg/dL — ABNORMAL HIGH (ref 70–99)
Glucose-Capillary: 154 mg/dL — ABNORMAL HIGH (ref 70–99)

## 2018-10-17 LAB — CBC
HCT: 29.1 % — ABNORMAL LOW (ref 39.0–52.0)
Hemoglobin: 9.9 g/dL — ABNORMAL LOW (ref 13.0–17.0)
MCH: 32.2 pg (ref 26.0–34.0)
MCHC: 34 g/dL (ref 30.0–36.0)
MCV: 94.8 fL (ref 80.0–100.0)
Platelets: 108 10*3/uL — ABNORMAL LOW (ref 150–400)
RBC: 3.07 MIL/uL — ABNORMAL LOW (ref 4.22–5.81)
RDW: 13.6 % (ref 11.5–15.5)
WBC: 5.3 10*3/uL (ref 4.0–10.5)
nRBC: 0 % (ref 0.0–0.2)

## 2018-10-17 LAB — BASIC METABOLIC PANEL
Anion gap: 4 — ABNORMAL LOW (ref 5–15)
BUN: 12 mg/dL (ref 6–20)
CALCIUM: 8.8 mg/dL — AB (ref 8.9–10.3)
CO2: 19 mmol/L — ABNORMAL LOW (ref 22–32)
CREATININE: 0.69 mg/dL (ref 0.61–1.24)
Chloride: 114 mmol/L — ABNORMAL HIGH (ref 98–111)
GFR calc Af Amer: >60 mL/min (ref >60)
GFR calc non Af Amer: >60 mL/min (ref >60)
Glucose, Bld: 116 mg/dL — ABNORMAL HIGH (ref 70–99)
Potassium: 3.7 mmol/L (ref 3.5–5.1)
Sodium: 137 mmol/L (ref 135–145)

## 2018-10-17 LAB — MAGNESIUM: Magnesium: 1.5 mg/dL — ABNORMAL LOW (ref 1.7–2.4)

## 2018-10-17 NOTE — Telephone Encounter (Signed)
Dr. [name] saw this pt he was still in the hospital. Progress note for this morning pt was advised of dressing changes and wtb status.

## 2018-10-17 NOTE — Progress Notes (Signed)
Patient ID: Cory Palmer, male   DOB: 09/30/1960, 58 y.o.   MRN: [id number] Patient is seen in follow-up for left transmetatarsal amputation.  The maceration and drainage has resolved significantly the wound edges are well approximated there is no swelling no cellulitis there is a small amount of serosanguineous drainage.  Discussed with the patient if he can keep his weight off the foot this should heal uneventfully.  Discussed with weightbearing patient will most likely have wound dehiscence.  Patient may begin Dial soap cleansing dry dressing changes daily.  Patient states that he is planning to discharge to home on independent living.

## 2018-10-17 NOTE — Progress Notes (Signed)
NCM made aware pt appealed d/c per CMA. NCM made MD aware. Gae Gallop RN,BSN,CM

## 2018-10-17 NOTE — Progress Notes (Signed)
PROGRESS NOTE    Cory Palmer  GTX:[id number] DOB: 02-04-1961 DOA: 10/04/2018 PCP: Patient, [name] Per   Brief Narrative:  58 year old with history of diabetes mellitus type 2, bipolar disorder, right-sided BKA, cocaine use presented to the hospital on 3/6 with complaints of chest discomfort and fever.  Patient recently had left foot partially amputated on 2/28.  He was admitted for sepsis and eventually ended up growing MSSA/strep bacteremia.  No osteomyelitis was noted.  Infectious disease and orthopedic were consulted and it was recommended to continue IV Ancef during the hospitalization which could be switched to Keflex with plans of 2 weeks of total antibiotics, last day 3/20.   Assessment & Plan:   Principal Problem:   Sepsis without acute organ dysfunction (HCC) Active Problems:   Homelessness   Diabetes mellitus type 2, noninsulin dependent (HCC)   S/P transmetatarsal amputation of foot, left (HCC)   Depressed bipolar disorder (HCC)   Hypertension   Hyperbilirubinemia   Bacteremia due to Gram-positive bacteria   Hypokalemia   Hypomagnesemia   PAF (paroxysmal atrial fibrillation) (HCC)   Hx of BKA, right (HCC)   Streptococcus/MSSA bacteremia secondary to infection of left foot status post transmetatarsal amputation - Blood culture data has been reviewed.  Repeat cultures have been negative.  TEE performed on 10/05/2018 is also negative for evidence of endocarditis -IV Ancef will transition to oral Keflex.  Last day of antibiotics will be March 20/20.  Appreciate input from infectious disease. - Local wound care per nursing staff but have advised them to consult wound care team as it becomes necessary.  Diabetes mellitus type 2 -Currently on metformin thousand milligrams daily.  Insulin sliding scale and Accu-Cheks.  Pre-meal insulin ordered.  New onset atrial fibrillation, brief episode.  Likely paroxysmal.  Seen by cardiology.  Ejection fraction 60 to 65%.  Not a candidate  for long-term anticoagulation due to anemia/thrombocytopenia.  Can follow-up outpatient with cardiology.  History of depression/bipolar disorder - Continue trazodone 100 mg at bedtime, Seroquel for 100 mg at bedtime.  Substance abuse. -Counseled to quit using illicit drugs.  He understands this.  BPH -Flomax  DVT prophylaxis: Lovenox  code Status: Full code Family Communication: None at bedside Disposition Plan: Discharge, patient is made n.p.o.  Consultants:   Infectious disease  Orthopedic  Procedures:   None  Antimicrobials:   Currently on Ancef with plans to transition to Keflex.  Last day of antibiotics would be 3/20   Subjective: No new complaints today.  Review of Systems Otherwise negative except as per HPI, including: General = no fevers, chills, dizziness, malaise, fatigue HEENT/EYES = negative for pain, redness, loss of vision, double vision, blurred vision, loss of hearing, sore throat, hoarseness, dysphagia Cardiovascular= negative for chest pain, palpitation, murmurs, lower extremity swelling Respiratory/lungs= negative for shortness of breath, cough, hemoptysis, wheezing, mucus production Gastrointestinal= negative for nausea, vomiting,, abdominal pain, melena, hematemesis Genitourinary= negative for Dysuria, Hematuria, Change in Urinary Frequency MSK = Negative for arthralgia, myalgias, Back Pain, Joint swelling  Neurology= Negative for headache, seizures, numbness, tingling  Psychiatry= Negative for anxiety, depression, suicidal and homocidal ideation Allergy/Immunology= Medication/Food allergy as listed  Skin= Negative for Rash, lesions, ulcers, itching   Objective: Vitals:   10/16/18 0516 10/16/18 1316 10/16/18 2125 10/17/18 0535  BP: 122/68 134/75 103/89 (!) 91/51  Pulse: 66 72 77 65  Resp:  20 20 18   Temp: 98.2 F (36.8 C) 98.2 F (36.8 C) 98.5 F (36.9 C) 97.8 F (36.6 C)  TempSrc:  Oral Oral    SpO2: 95% 96% 98% 94%  Weight:       Height:        Intake/Output Summary (Last 24 hours) at 10/17/2018 1245 Last data filed at 10/17/2018 0900 Gross per 24 hour  Intake 800 ml  Output 1100 ml  Net -300 ml   Filed Weights   10/07/18 0332 10/08/18 0302 10/09/18 0700  Weight: 109 kg 109.7 kg 104.5 kg    Examination: Constitutional: NAD, calm, comfortable Eyes: PERRL, lids and conjunctivae normal ENMT: Mucous membranes are moist. Posterior pharynx clear of any exudate or lesions.Normal dentition.  Neck: normal, supple, no masses, no thyromegaly Respiratory: clear to auscultation bilaterally, no wheezing, no crackles. Normal respiratory effort. No accessory muscle use.  Cardiovascular: Regular rate and rhythm, no murmurs / rubs / gallops. No extremity edema. 2+ pedal pulses. No carotid bruits.  Abdomen: no tenderness, no masses palpated. No hepatosplenomegaly. Bowel sounds positive.  Musculoskeletal: no clubbing / cyanosis. No joint deformity upper and lower extremities. Good ROM, no contractures. Normal muscle tone.  Skin: Right-sided BKA noted, left lower extremity amputation site noted with dressing in place. Neurologic: CN 2-12 grossly intact. Sensation intact, DTR normal. Strength 5/5 in all 4.  Psychiatric: Normal judgment and insight. Alert and oriented x 3. Normal mood.     Data Reviewed:   CBC: Recent Labs  Lab 10/13/18 0604 10/17/18 0312  WBC 4.8 5.3  HGB 9.6* 9.9*  HCT 28.8* 29.1*  MCV 95.0 94.8  PLT 106* 108*   Basic Metabolic Panel: Recent Labs  Lab 10/12/18 0410 10/13/18 0604 10/17/18 0312  NA  --  137 137  K  --  3.7 3.7  CL  --  108 114*  CO2  --  19* 19*  GLUCOSE  --  156* 116*  BUN  --  13 12  CREATININE 0.86 0.76 0.69  CALCIUM  --  9.0 8.8*  MG  --   --  1.5*   GFR: Estimated Creatinine Clearance: 121.9 mL/min (by C-G formula based on SCr of 0.69 mg/dL). Liver Function Tests: No results for input(s): AST, ALT, ALKPHOS, BILITOT, PROT, ALBUMIN in the last 168 hours. No results  for input(s): LIPASE, AMYLASE in the last 168 hours. No results for input(s): AMMONIA in the last 168 hours. Coagulation Profile: No results for input(s): INR, PROTIME in the last 168 hours. Cardiac Enzymes: No results for input(s): CKTOTAL, CKMB, CKMBINDEX, TROPONINI in the last 168 hours. BNP (last 3 results) No results for input(s): PROBNP in the last 8760 hours. HbA1C: No results for input(s): HGBA1C in the last 72 hours. CBG: Recent Labs  Lab 10/16/18 1147 10/16/18 1638 10/16/18 2123 10/17/18 0807 10/17/18 1139  GLUCAP 128* 159* 136* 240* 156*   Lipid Profile: No results for input(s): CHOL, HDL, LDLCALC, TRIG, CHOLHDL, LDLDIRECT in the last 72 hours. Thyroid Function Tests: No results for input(s): TSH, T4TOTAL, FREET4, T3FREE, THYROIDAB in the last 72 hours. Anemia Panel: No results for input(s): VITAMINB12, FOLATE, FERRITIN, TIBC, IRON, RETICCTPCT in the last 72 hours. Sepsis Labs: No results for input(s): PROCALCITON, LATICACIDVEN in the last 168 hours.  No results found for this or any previous visit (from the past 240 hour(s)).       Radiology Studies: No results found.      Scheduled Meds:  enoxaparin (LOVENOX) injection  50 mg Subcutaneous Q24H   insulin aspart  0-20 Units Subcutaneous TID WC   magic mouthwash  10 mL Oral TID   metFORMIN  1,000 mg Oral Q breakfast   nicotine  14 mg Transdermal Daily   polyethylene glycol  17 g Oral BID   QUEtiapine  400 mg Oral QHS   senna-docusate  1 tablet Oral BID   tamsulosin  0.4 mg Oral QPC supper   traZODone  100 mg Oral QHS   vitamin B-12  1,000 mcg Oral Daily   Continuous Infusions:  sodium chloride 10 mL/hr at 10/12/18 1610    ceFAZolin (ANCEF) IV 2 g (10/17/18 0513)     LOS: 12 days   Time spent= 20 mins    Saahil Herbster Joline Maxcy, MD [facility]  If 7PM-7AM, please contact night-coverage [email or web address] 10/17/2018, 12:45 PM

## 2018-10-17 NOTE — Telephone Encounter (Signed)
There is no other contact information for this pt. The number that he left in his message continues to say that the line has been temporarily disconnected.

## 2018-10-17 NOTE — Plan of Care (Signed)
  Problem: Education: Goal: Knowledge of General Education information will improve Description: Including pain rating scale, medication(s)/side effects and non-pharmacologic comfort measures Outcome: Progressing   Problem: Health Behavior/Discharge Planning: Goal: Ability to manage health-related needs will improve Outcome: Progressing   Problem: Clinical Measurements: Goal: Ability to maintain clinical measurements within normal limits will improve Outcome: Progressing Goal: Will remain free from infection Outcome: Progressing Goal: Diagnostic test results will improve Outcome: Progressing Goal: Respiratory complications will improve Outcome: Progressing Goal: Cardiovascular complication will be avoided Outcome: Progressing   Problem: Activity: Goal: Risk for activity intolerance will decrease Outcome: Progressing   Problem: Skin Integrity: Goal: Risk for impaired skin integrity will decrease Outcome: Progressing   

## 2018-10-18 LAB — BASIC METABOLIC PANEL
Anion gap: 7 (ref 5–15)
BUN: 15 mg/dL (ref 6–20)
CO2: 20 mmol/L — ABNORMAL LOW (ref 22–32)
Calcium: 8.8 mg/dL — ABNORMAL LOW (ref 8.9–10.3)
Chloride: 109 mmol/L (ref 98–111)
Creatinine, Ser: 0.68 mg/dL (ref 0.61–1.24)
GFR calc non Af Amer: >60 mL/min (ref >60)
Glucose, Bld: 170 mg/dL — ABNORMAL HIGH (ref 70–99)
POTASSIUM: 3.8 mmol/L (ref 3.5–5.1)
Sodium: 136 mmol/L (ref 135–145)

## 2018-10-18 LAB — CBC
HCT: 28.8 % — ABNORMAL LOW (ref 39.0–52.0)
HEMOGLOBIN: 9.5 g/dL — AB (ref 13.0–17.0)
MCH: 31.1 pg (ref 26.0–34.0)
MCHC: 33 g/dL (ref 30.0–36.0)
MCV: 94.4 fL (ref 80.0–100.0)
Platelets: 100 10*3/uL — ABNORMAL LOW (ref 150–400)
RBC: 3.05 MIL/uL — AB (ref 4.22–5.81)
RDW: 13.5 % (ref 11.5–15.5)
WBC: 5 10*3/uL (ref 4.0–10.5)
nRBC: 0 % (ref 0.0–0.2)

## 2018-10-18 LAB — MAGNESIUM: Magnesium: 1.6 mg/dL — ABNORMAL LOW (ref 1.7–2.4)

## 2018-10-18 LAB — GLUCOSE, CAPILLARY: GLUCOSE-CAPILLARY: 127 mg/dL — AB (ref 70–99)

## 2018-10-18 MED ORDER — LIDOCAINE VISCOUS HCL 2 % MT SOLN
15.0000 mL | Freq: Once | OROMUCOSAL | Status: AC
  Administered 2018-10-18: 15 mL via ORAL
  Filled 2018-10-18: qty 15

## 2018-10-18 MED ORDER — ALUM & MAG HYDROXIDE-SIMETH 200-200-20 MG/5ML PO SUSP
30.0000 mL | Freq: Once | ORAL | Status: AC
  Administered 2018-10-18: 30 mL via ORAL
  Filled 2018-10-18: qty 30

## 2018-10-18 MED ORDER — CEPHALEXIN 500 MG PO CAPS: 500.0000 mg | ORAL_CAPSULE | Freq: Four times a day (QID) | ORAL | 0 refills | Status: AC

## 2018-10-18 MED ORDER — TAMSULOSIN HCL 0.4 MG PO CAPS: 0.4000 mg | ORAL_CAPSULE | Freq: Every day | ORAL | 0 refills | Status: AC

## 2018-10-18 MED FILL — CEPHALEXIN 500 MG CAPSULE: 500 | 2 days supply | Qty: 8 | Fill #0

## 2018-10-18 MED FILL — TAMSULOSIN HCL 0.4 MG CAP: 0.4 | 30 days supply | Qty: 30 | Fill #0

## 2018-10-18 NOTE — Progress Notes (Addendum)
[id number] informed NP Schorr of patient complaint of heartburn, muscle cramp feeling left side of chest and belching. Patient indicated no crushing chest pain, SOB and that cramping chest pain felt with movement of left arm. One time dose Maalox/Lidocaine given per order.   Left foot dressing change complete at 0640

## 2018-10-18 NOTE — Progress Notes (Signed)
NCM received call from CMA and was informed pt's appeal was denied, Asa Lente agrees with d/c. NCM informed pt. Pt stated had already received call from [facility] and told of information given. Gae Gallop RN,BSN,CM

## 2018-10-18 NOTE — Progress Notes (Signed)
Patient requested taxi voucher to his hotel. CSW provided it on shadow chart.   Cory Casco Jenaro Souder LCSW [phone]

## 2018-10-18 NOTE — Progress Notes (Signed)
Discharge education complete. Meds, diet, activity, follow up appointments reviewed and all questions answered. Copy of instructions given to patient.

## 2018-10-18 NOTE — Progress Notes (Signed)
PROGRESS NOTE    DAMASCUS BLOT  LFY:[id number] DOB: 1960-08-10 DOA: 10/04/2018 PCP: Patient, [name] Per   Brief Narrative:  58 year old with history of diabetes mellitus type 2, bipolar disorder, right-sided BKA, cocaine use presented to the hospital on 3/6 with complaints of chest discomfort and fever.  Patient recently had left foot partially amputated on 2/28.  He was admitted for sepsis and eventually ended up growing MSSA/strep bacteremia.  No osteomyelitis was noted.  Infectious disease and orthopedic were consulted and it was recommended to continue IV Ancef during the hospitalization which could be switched to Keflex with plans of 2 weeks of total antibiotics, last day 3/20.   Assessment & Plan:   Principal Problem:   Sepsis without acute organ dysfunction (HCC) Active Problems:   Homelessness   Diabetes mellitus type 2, noninsulin dependent (HCC)   S/P transmetatarsal amputation of foot, left (HCC)   Depressed bipolar disorder (HCC)   Hypertension   Hyperbilirubinemia   Bacteremia due to Gram-positive bacteria   Hypokalemia   Hypomagnesemia   PAF (paroxysmal atrial fibrillation) (HCC)   Hx of BKA, right (HCC)   Streptococcus/MSSA bacteremia secondary to infection of left foot status post transmetatarsal amputation; stabe - Blood culture data has been reviewed.  Repeat cultures have been negative.  TEE performed on 10/05/2018 is also negative for evidence of endocarditis -IV Ancef will transition to oral Keflex.  Last day of antibiotics will be March 20/20.  Seen by ID - Local wound care per nursing staff but have advised them to consult wound care team as it becomes necessary.  Diabetes mellitus type 2 -On Metformin, ISS accuchecks.  New onset atrial fibrillation, brief episode.  Likely paroxysmal.  Seen by cardiology.  Ejection fraction 60 to 65%.  Not a candidate for long-term anticoagulation due to anemia/thrombocytopenia.  Can follow-up outpatient with  cardiology.  History of depression/bipolar disorder - Continue trazodone 100 mg at bedtime, Seroquel for 100 mg at bedtime.  Substance abuse. -Counseled to quit using illicit drugs.  He understands this.  BPH -Flomax  DVT prophylaxis: Lovenox  code Status: Full code Family Communication: None at bedside Disposition Plan: Discharge when possible. Patient has appealed his discharge.   Consultants:   Infectious disease  Orthopedic  Procedures:   None  Antimicrobials:   Currently on Ancef with plans to transition to Keflex.  Last day of antibiotics would be 3/20   Subjective: No new complaints, feels ok.   Review of Systems Otherwise negative except as per HPI, including: General = no fevers, chills, dizziness, malaise, fatigue HEENT/EYES = negative for pain, redness, loss of vision, double vision, blurred vision, loss of hearing, sore throat, hoarseness, dysphagia Cardiovascular= negative for chest pain, palpitation, murmurs, lower extremity swelling Respiratory/lungs= negative for shortness of breath, cough, hemoptysis, wheezing, mucus production Gastrointestinal= negative for nausea, vomiting,, abdominal pain, melena, hematemesis Genitourinary= negative for Dysuria, Hematuria, Change in Urinary Frequency MSK = Negative for arthralgia, myalgias, Back Pain, Joint swelling  Neurology= Negative for headache, seizures, numbness, tingling  Psychiatry= Negative for anxiety, depression, suicidal and homocidal ideation Allergy/Immunology= Medication/Food allergy as listed  Skin= Negative for Rash, lesions, ulcers, itching   Objective: Vitals:   10/17/18 1505 10/17/18 2021 10/18/18 0121 10/18/18 0552  BP: 118/73 122/78 121/78 112/73  Pulse: 74 72 83 65  Resp: 16 16 18 15   Temp: 97.6 F (36.4 C) 98.3 F (36.8 C) 99.4 F (37.4 C) 98.6 F (37 C)  TempSrc: Oral Oral Oral Oral  SpO2: 98% 97%  97% 97%  Weight:      Height:        Intake/Output Summary (Last 24 hours)  at 10/18/2018 1207 Last data filed at 10/18/2018 0844 Gross per 24 hour  Intake 700 ml  Output 1800 ml  Net -1100 ml   Filed Weights   10/07/18 0332 10/08/18 0302 10/09/18 0700  Weight: 109 kg 109.7 kg 104.5 kg    Examination: Constitutional: NAD, calm, comfortable Eyes: PERRL, lids and conjunctivae normal ENMT: Mucous membranes are moist. Posterior pharynx clear of any exudate or lesions.Normal dentition.  Neck: normal, supple, no masses, no thyromegaly Respiratory: clear to auscultation bilaterally, no wheezing, no crackles. Normal respiratory effort. No accessory muscle use.  Cardiovascular: Regular rate and rhythm, no murmurs / rubs / gallops. No extremity edema. 2+ pedal pulses. No carotid bruits.  Abdomen: no tenderness, no masses palpated. No hepatosplenomegaly. Bowel sounds positive.  Musculoskeletal: no clubbing / cyanosis. No joint deformity upper and lower extremities. Good ROM, no contractures. Normal muscle tone.  Skin: Right-sided BKA noted, left lower extremity amputation site noted with dressing in place. Neurologic: CN 2-12 grossly intact. Sensation intact, DTR normal. Strength 5/5 in all 4.  Psychiatric: Normal judgment and insight. Alert and oriented x 3. Normal mood.   Data Reviewed:   CBC: Recent Labs  Lab 10/13/18 0604 10/17/18 0312 10/18/18 0356  WBC 4.8 5.3 5.0  HGB 9.6* 9.9* 9.5*  HCT 28.8* 29.1* 28.8*  MCV 95.0 94.8 94.4  PLT 106* 108* 100*   Basic Metabolic Panel: Recent Labs  Lab 10/12/18 0410 10/13/18 0604 10/17/18 0312 10/18/18 0356  NA  --  137 137 136  K  --  3.7 3.7 3.8  CL  --  108 114* 109  CO2  --  19* 19* 20*  GLUCOSE  --  156* 116* 170*  BUN  --  13 12 15   CREATININE 0.86 0.76 0.69 0.68  CALCIUM  --  9.0 8.8* 8.8*  MG  --   --  1.5* 1.6*   GFR: Estimated Creatinine Clearance: 121.9 mL/min (by C-G formula based on SCr of 0.68 mg/dL). Liver Function Tests: No results for input(s): AST, ALT, ALKPHOS, BILITOT, PROT, ALBUMIN in  the last 168 hours. No results for input(s): LIPASE, AMYLASE in the last 168 hours. No results for input(s): AMMONIA in the last 168 hours. Coagulation Profile: No results for input(s): INR, PROTIME in the last 168 hours. Cardiac Enzymes: No results for input(s): CKTOTAL, CKMB, CKMBINDEX, TROPONINI in the last 168 hours. BNP (last 3 results) No results for input(s): PROBNP in the last 8760 hours. HbA1C: No results for input(s): HGBA1C in the last 72 hours. CBG: Recent Labs  Lab 10/17/18 0807 10/17/18 1139 10/17/18 1718 10/17/18 2020 10/18/18 0751  GLUCAP 240* 156* 122* 154* 127*   Lipid Profile: No results for input(s): CHOL, HDL, LDLCALC, TRIG, CHOLHDL, LDLDIRECT in the last 72 hours. Thyroid Function Tests: No results for input(s): TSH, T4TOTAL, FREET4, T3FREE, THYROIDAB in the last 72 hours. Anemia Panel: No results for input(s): VITAMINB12, FOLATE, FERRITIN, TIBC, IRON, RETICCTPCT in the last 72 hours. Sepsis Labs: No results for input(s): PROCALCITON, LATICACIDVEN in the last 168 hours.  No results found for this or any previous visit (from the past 240 hour(s)).       Radiology Studies: No results found.      Scheduled Meds:  enoxaparin (LOVENOX) injection  50 mg Subcutaneous Q24H   insulin aspart  0-20 Units Subcutaneous TID WC   magic mouthwash  10 mL Oral TID   metFORMIN  1,000 mg Oral Q breakfast   nicotine  14 mg Transdermal Daily   polyethylene glycol  17 g Oral BID   QUEtiapine  400 mg Oral QHS   senna-docusate  1 tablet Oral BID   tamsulosin  0.4 mg Oral QPC supper   traZODone  100 mg Oral QHS   vitamin B-12  1,000 mcg Oral Daily   Continuous Infusions:  sodium chloride 500 mL (10/17/18 2228)    ceFAZolin (ANCEF) IV 2 g (10/18/18 0504)     LOS: 13 days   Time spent= 20 mins    Merlie Noga Joline Maxcy, MD [facility]  If 7PM-7AM, please contact night-coverage [email or web address] 10/18/2018, 12:07 PM

## 2018-10-18 NOTE — Care Management Important Message (Signed)
Important Message  Patient Details  Name: Cory Palmer MRN: [id number] Date of Birth: [birth year]   Medicare Important Message Given:  Yes    Dimitrious Micciche 10/18/2018, 1:14 PM

## 2018-10-22 ENCOUNTER — Encounter (HOSPITAL_COMMUNITY): Payer: Self-pay | Admitting: Internal Medicine

## 2018-10-25 ENCOUNTER — Inpatient Hospital Stay (INDEPENDENT_AMBULATORY_CARE_PROVIDER_SITE_OTHER): Payer: Medicare Other | Admitting: Orthopedic Surgery

## 2018-10-28 ENCOUNTER — Emergency Department (HOSPITAL_COMMUNITY): Payer: Medicare Other

## 2018-10-28 ENCOUNTER — Inpatient Hospital Stay (HOSPITAL_COMMUNITY)
Admission: EM | Admit: 2018-10-28 | Discharge: 2018-11-02 | DRG: 637 | Disposition: A | Discharge status: Skilled Nursing Facility | Payer: Medicare Other | Attending: Family Medicine | Admitting: Family Medicine

## 2018-10-28 ENCOUNTER — Other Ambulatory Visit: Payer: Self-pay

## 2018-10-28 DIAGNOSIS — I10 Essential (primary) hypertension: Secondary | ICD-10-CM | POA: Diagnosis present

## 2018-10-28 DIAGNOSIS — F3130 Bipolar disorder, current episode depressed, mild or moderate severity, unspecified: Secondary | ICD-10-CM | POA: Diagnosis present

## 2018-10-28 DIAGNOSIS — K7460 Unspecified cirrhosis of liver: Secondary | ICD-10-CM | POA: Diagnosis present

## 2018-10-28 DIAGNOSIS — Z8249 Family history of ischemic heart disease and other diseases of the circulatory system: Secondary | ICD-10-CM

## 2018-10-28 DIAGNOSIS — F17210 Nicotine dependence, cigarettes, uncomplicated: Secondary | ICD-10-CM | POA: Diagnosis present

## 2018-10-28 DIAGNOSIS — G9341 Metabolic encephalopathy: Secondary | ICD-10-CM | POA: Diagnosis not present

## 2018-10-28 DIAGNOSIS — M869 Osteomyelitis, unspecified: Secondary | ICD-10-CM | POA: Diagnosis not present

## 2018-10-28 DIAGNOSIS — E1169 Type 2 diabetes mellitus with other specified complication: Principal | ICD-10-CM | POA: Diagnosis present

## 2018-10-28 DIAGNOSIS — E11621 Type 2 diabetes mellitus with foot ulcer: Secondary | ICD-10-CM | POA: Diagnosis present

## 2018-10-28 DIAGNOSIS — F419 Anxiety disorder, unspecified: Secondary | ICD-10-CM | POA: Diagnosis present

## 2018-10-28 DIAGNOSIS — M86172 Other acute osteomyelitis, left ankle and foot: Secondary | ICD-10-CM | POA: Diagnosis present

## 2018-10-28 DIAGNOSIS — D649 Anemia, unspecified: Secondary | ICD-10-CM | POA: Diagnosis present

## 2018-10-28 DIAGNOSIS — Z590 Homelessness: Secondary | ICD-10-CM | POA: Diagnosis not present

## 2018-10-28 DIAGNOSIS — E119 Type 2 diabetes mellitus without complications: Secondary | ICD-10-CM | POA: Diagnosis not present

## 2018-10-28 DIAGNOSIS — N400 Enlarged prostate without lower urinary tract symptoms: Secondary | ICD-10-CM

## 2018-10-28 DIAGNOSIS — S88111A Complete traumatic amputation at level between knee and ankle, right lower leg, initial encounter: Secondary | ICD-10-CM

## 2018-10-28 DIAGNOSIS — Z79899 Other long term (current) drug therapy: Secondary | ICD-10-CM

## 2018-10-28 DIAGNOSIS — Z833 Family history of diabetes mellitus: Secondary | ICD-10-CM | POA: Diagnosis not present

## 2018-10-28 DIAGNOSIS — I480 Paroxysmal atrial fibrillation: Secondary | ICD-10-CM | POA: Diagnosis present

## 2018-10-28 DIAGNOSIS — B9789 Other viral agents as the cause of diseases classified elsewhere: Secondary | ICD-10-CM | POA: Diagnosis present

## 2018-10-28 DIAGNOSIS — L97521 Non-pressure chronic ulcer of other part of left foot limited to breakdown of skin: Secondary | ICD-10-CM | POA: Diagnosis present

## 2018-10-28 DIAGNOSIS — R17 Unspecified jaundice: Secondary | ICD-10-CM

## 2018-10-28 DIAGNOSIS — R059 Cough, unspecified: Secondary | ICD-10-CM

## 2018-10-28 DIAGNOSIS — D696 Thrombocytopenia, unspecified: Secondary | ICD-10-CM | POA: Diagnosis present

## 2018-10-28 DIAGNOSIS — Z89511 Acquired absence of right leg below knee: Secondary | ICD-10-CM | POA: Diagnosis not present

## 2018-10-28 DIAGNOSIS — Z7984 Long term (current) use of oral hypoglycemic drugs: Secondary | ICD-10-CM

## 2018-10-28 DIAGNOSIS — R05 Cough: Secondary | ICD-10-CM

## 2018-10-28 LAB — COMPREHENSIVE METABOLIC PANEL
ALT: 12 U/L (ref 0–44)
AST: 23 U/L (ref 15–41)
Albumin: 3.5 g/dL (ref 3.5–5.0)
Alkaline Phosphatase: 128 U/L — ABNORMAL HIGH (ref 38–126)
Anion gap: 7 (ref 5–15)
BUN: 6 mg/dL (ref 6–20)
CO2: 22 mmol/L (ref 22–32)
Calcium: 9 mg/dL (ref 8.9–10.3)
Chloride: 106 mmol/L (ref 98–111)
Creatinine, Ser: 0.9 mg/dL (ref 0.61–1.24)
GFR calc Af Amer: >60 mL/min (ref >60)
GFR calc non Af Amer: >60 mL/min (ref >60)
Glucose, Bld: 148 mg/dL — ABNORMAL HIGH (ref 70–99)
Potassium: 3.5 mmol/L (ref 3.5–5.1)
Sodium: 135 mmol/L (ref 135–145)
Total Bilirubin: 2.1 mg/dL — ABNORMAL HIGH (ref 0.3–1.2)
Total Protein: 7.2 g/dL (ref 6.5–8.1)

## 2018-10-28 LAB — CBC WITH DIFFERENTIAL/PLATELET
ABS IMMATURE GRANULOCYTES: 0.02 10*3/uL (ref 0.00–0.07)
Basophils Absolute: 0 10*3/uL (ref 0.0–0.1)
Basophils Relative: 1 %
Eosinophils Absolute: 0.4 10*3/uL (ref 0.0–0.5)
Eosinophils Relative: 5 %
HCT: 33.9 % — ABNORMAL LOW (ref 39.0–52.0)
Hemoglobin: 10.9 g/dL — ABNORMAL LOW (ref 13.0–17.0)
Immature Granulocytes: 0 %
Lymphocytes Relative: 16 %
Lymphs Abs: 1.4 10*3/uL (ref 0.7–4.0)
MCH: 31.1 pg (ref 26.0–34.0)
MCHC: 32.2 g/dL (ref 30.0–36.0)
MCV: 96.6 fL (ref 80.0–100.0)
Monocytes Absolute: 0.7 10*3/uL (ref 0.1–1.0)
Monocytes Relative: 8 %
NRBC: 0 % (ref 0.0–0.2)
Neutro Abs: 5.9 10*3/uL (ref 1.7–7.7)
Neutrophils Relative %: 70 %
Platelets: 125 10*3/uL — ABNORMAL LOW (ref 150–400)
RBC: 3.51 MIL/uL — ABNORMAL LOW (ref 4.22–5.81)
RDW: 14 % (ref 11.5–15.5)
WBC: 8.4 10*3/uL (ref 4.0–10.5)

## 2018-10-28 LAB — LACTIC ACID, PLASMA: Lactic Acid, Venous: 1 mmol/L (ref 0.5–1.9)

## 2018-10-28 LAB — C-REACTIVE PROTEIN: CRP: <0.8 mg/dL (ref <1.0)

## 2018-10-28 LAB — CBG MONITORING, ED: Glucose-Capillary: 165 mg/dL — ABNORMAL HIGH (ref 70–99)

## 2018-10-28 LAB — SEDIMENTATION RATE: Sed Rate: 33 mm/hr — ABNORMAL HIGH (ref 0–16)

## 2018-10-28 NOTE — ED Triage Notes (Signed)
Pt presents with c/o his left foot bleeding on/near surgical amputation site. Pt reports pain at surgical site. Pt denies dizziness, lightheadedness and N/V/D. Pt has hx of diabetes and bipolar and reports he has been without all his meds over a week.

## 2018-10-28 NOTE — H&P (Signed)
History and Physical    Cory Palmer OHY:[id number] DOB: 07-10-1961 DOA: 10/28/2018  PCP: Patient, [name] Per Patient coming from: Home  Chief Complaint: Bleeding from left foot surgical site  HPI: Cory Palmer is a 58 y.o. male with medical history significant of anxiety, bipolar disorder with depression, type 2 diabetes, hypertension, paroxysmal atrial fibrillation, cocaine use, right-sided BKA, recent transmetatarsal amputation of left foot on 2/28 by Dr. Sharol Given presenting to the hospital for evaluation bleeding from his foot wound surgical site for the past 5 days.  He has not noticed any pus drainage.  He is also having some slight pain in this foot.  States he has not had a chance to see Dr. Sharol Given since his recent hospital discharge due to transportation problems.  States he finished his antibiotic course but has not taken any of his diabetes medications since his hospital discharge.  Denies having any fevers, chills, chest pain, cough, shortness of breath, abdominal pain, nausea, or vomiting.  Patient recently had left foot transmetatarsal amputation on 2/28.  He was admitted to the hospital 3/5 to 3/19 for sepsis secondary to MSSA/strep bacteremia.  No osteomyelitis.  ID and orthopedics were consulted.  He was treated with IV Ancef during the hospitalization and switched to Keflex. Last day of antibiotics 3/20.  TTE performed 3/6 negative for endocarditis.  Review of Systems: As per HPI otherwise 10 point review of systems negative.  Past Medical History:  Diagnosis Date  . Anxiety   . Depressed bipolar disorder ([place])   . Diabetes mellitus without complication ([place])   . Hypertension     Past Surgical History:  Procedure Laterality Date  . AMPUTATION Left 09/28/2018   Procedure: LEFT TRANSMETATARSAL AMPUTATION;  Surgeon: Cory Minion, MD;  Location: [facility];  Service: Orthopedics;  Laterality: Left;  . BELOW KNEE LEG AMPUTATION Right      reports that he has been smoking  cigarettes. He has never used smokeless tobacco. He reports current alcohol use. He reports previous drug use.  No Known Allergies  Family History  Problem Relation Age of Onset  . Hypertension Other   . Diabetes Mellitus II Other     Prior to Admission medications   Medication Sig Start Date End Date Taking? Authorizing Provider  acetaminophen (TYLENOL) 325 MG tablet Take 2 tablets (650 mg total) by mouth every 6 (six) hours as needed for mild pain (or Fever >/= 101). 05/10/18   Danford, Suann Larry, MD  feeding supplement, GLUCERNA SHAKE, (GLUCERNA SHAKE) LIQD Take 237 mLs by mouth 2 (two) times daily between meals. Patient not taking: Reported on 10/05/2018 05/10/18   Edwin Dada, MD  metFORMIN (GLUCOPHAGE) 1000 MG tablet Take 1 tablet (1,000 mg total) by mouth 2 (two) times daily with a meal. 08/28/18   Ward, Delice Bison, DO  Multiple Vitamin (MULTIVITAMIN WITH MINERALS) TABS tablet Take 1 tablet by mouth daily. Patient not taking: Reported on 10/05/2018 05/11/18   Edwin Dada, MD  QUEtiapine (SEROQUEL) 400 MG tablet Take 1 tablet (400 mg total) by mouth at bedtime. 08/28/18   Ward, Delice Bison, DO  tamsulosin (FLOMAX) 0.4 MG CAPS capsule Take 1 capsule (0.4 mg total) by mouth daily after supper for 30 days. 10/18/18 11/17/18  Amin, Jeanella Flattery, MD  traZODone (DESYREL) 100 MG tablet Take 1 tablet (100 mg total) by mouth at bedtime. 08/28/18   Ward, Delice Bison, DO    Physical Exam: Vitals:   10/28/18 9485 10/28/18 2024 10/28/18 2324  10/29/18 0028  BP:  131/81 136/73 136/76  Pulse:   65 65  Resp:   16 15  Temp:    98.3 F (36.8 C)  TempSrc:    Oral  SpO2:   99%   Weight: 117.9 kg     Height: _0  (1.93 m)       Physical Exam  Constitutional: He appears well-developed and well-nourished. No distress.  HENT:  Head: Normocephalic.  Eyes: Right eye exhibits no discharge. Left eye exhibits no discharge.  Neck: Neck supple.  Cardiovascular: Normal rate, regular  rhythm and intact distal pulses.  Pulmonary/Chest: Effort normal and breath sounds normal. No respiratory distress. He has no wheezes. He has no rales.  Abdominal: Soft. Bowel sounds are normal. He exhibits no distension. There is no abdominal tenderness. There is no guarding.  Musculoskeletal:        General: No edema.     Comments: Right BKA Left foot: Appears edematous. Clotted blood noted at the surgical incision site.  Dressing soaked with blood. No purulent drainage noted.  Skin: He is not diaphoretic.           Labs on Admission: I have personally reviewed following labs and imaging studies  CBC: Recent Labs  Lab 10/28/18 2130  WBC 8.4  NEUTROABS 5.9  HGB 10.9*  HCT 33.9*  MCV 96.6  PLT 253*   Basic Metabolic Panel: Recent Labs  Lab 10/28/18 2130  NA 135  K 3.5  CL 106  CO2 22  GLUCOSE 148*  BUN 6  CREATININE 0.90  CALCIUM 9.0   GFR: Estimated Creatinine Clearance: 125.5 mL/min (by C-G formula based on SCr of 0.9 mg/dL). Liver Function Tests: Recent Labs  Lab 10/28/18 2130  AST 23  ALT 12  ALKPHOS 128*  BILITOT 2.1*  PROT 7.2  ALBUMIN 3.5   No results for input(s): LIPASE, AMYLASE in the last 168 hours. No results for input(s): AMMONIA in the last 168 hours. Coagulation Profile: No results for input(s): INR, PROTIME in the last 168 hours. Cardiac Enzymes: No results for input(s): CKTOTAL, CKMB, CKMBINDEX, TROPONINI in the last 168 hours. BNP (last 3 results) No results for input(s): PROBNP in the last 8760 hours. HbA1C: No results for input(s): HGBA1C in the last 72 hours. CBG: Recent Labs  Lab 10/28/18 2046 10/29/18 0034  GLUCAP 165* 180*   Lipid Profile: No results for input(s): CHOL, HDL, LDLCALC, TRIG, CHOLHDL, LDLDIRECT in the last 72 hours. Thyroid Function Tests: No results for input(s): TSH, T4TOTAL, FREET4, T3FREE, THYROIDAB in the last 72 hours. Anemia Panel: No results for input(s): VITAMINB12, FOLATE, FERRITIN, TIBC,  IRON, RETICCTPCT in the last 72 hours. Urine analysis:    Component Value Date/Time   COLORURINE AMBER (A) 10/05/2018 1153   APPEARANCEUR CLEAR 10/05/2018 1153   LABSPEC 1.034 (H) 10/05/2018 1153   PHURINE 5.0 10/05/2018 1153   GLUCOSEU NEGATIVE 10/05/2018 1153   HGBUR NEGATIVE 10/05/2018 1153   BILIRUBINUR SMALL (A) 10/05/2018 1153   KETONESUR 5 (A) 10/05/2018 1153   PROTEINUR 30 (A) 10/05/2018 1153   NITRITE NEGATIVE 10/05/2018 1153   LEUKOCYTESUR NEGATIVE 10/05/2018 1153    Radiological Exams on Admission: Dg Foot Complete Left  Result Date: 10/28/2018 CLINICAL DATA:  Left foot bleeding near surgical amputation site. Pain at surgical site. Rule out osteomyelitis. EXAM: LEFT FOOT - COMPLETE 3+ VIEW COMPARISON:  September 24, 2018 and October 04, 2018 FINDINGS: Three linear foreign bodies identified in the soft tissues, unchanged since September 24, 2018.  The amputation sites were sharp on the previous study. The amputation sites are less sharp today including the distal aspects of the remaining second, third, fourth, and fifth metatarsals. The distal second metatarsal appears more foreshortened today. IMPRESSION: 1. Bony erosion at the distal aspect of the remaining second, third, fourth, and fifth metatarsals concerning for osteomyelitis. 2. Stable foreign bodies in the soft tissues. Electronically Signed   By: Dorise Bullion III M.D   On: 10/28/2018 21:30    Assessment/Plan Principal Problem:   Osteomyelitis ([place]) Active Problems:   Diabetes mellitus type 2, noninsulin dependent (HCC)   PAF (paroxysmal atrial fibrillation) (HCC)   Serum total bilirubin elevated   BPH (benign prostatic hyperplasia)   Osteomyelitis of left foot; history of recent transmetatarsal amputation of left foot on 2/28 -Afebrile and no leukocytosis.  Lactic acid normal.  Hemodynamically stable.  No signs of sepsis at this time.  CRP normal.  X-ray of left foot showing bony erosion at the distal aspect of the  remaining second, third, fourth, and fifth metatarsals concerning for osteomyelitis. -Currently no active bleeding.  Clotted blood noted at the site. -ED provider discussed the case with Dr. Erlinda Hong from orthopedics, patient will be seen in the morning by Dr. Sharol Given. -Will hold off giving antibiotics at this time -ESR pending -Blood culture x2 -Wound care consult -Keep n.p.o. after midnight -Tylenol PRN mild pain -Continue to monitor CBC  Elevated T bili T bili 2.1.  Alk phos borderline elevated at 128.  AST and ALT normal. -Right upper quadrant ultrasound -Hepatic function panel in a.m.  Diabetes mellitus type 2 A1c 6.6 on 2/28.  Sliding scale insulin and CBG checks.  Paroxysmal atrial fibrillation Currently in sinus rhythm.  Per recent discharge summary: Seen by cardiology during his recent hospitalization.  Ejection fraction 60 to 65%.  Not a candidate for long-term anticoagulation due to anemia/thrombocytopenia.      BPH -Continue Flomax  Unable to safely order remainder of home medications at this time as pharmacy medication reconciliation is pending.  DVT prophylaxis: SCDs Code Status: Full code Family Communication: No family available. Disposition Plan: Anticipate discharge after clinical improvement.   Consults called: Orthopedics Admission status: It is my clinical opinion that admission to INPATIENT is reasonable and necessary in this 58 y.o. male . presenting with left foot osteomyelitis . in the context of PMH including: Recent left foot partial amputation . with pertinent positives on physical exam including: Bleeding at the surgical site . and pertinent positives on radiographic and laboratory data including: X-ray with evidence of osteomyelitis . Workup and treatment include surgical consultation in the morning and likely amputation.  Given the aforementioned, the predictability of an adverse outcome is felt to be significant. I expect that the patient will require  at least 2 midnights in the hospital to treat this condition.   This chart was dictated using voice recognition software.  Despite best efforts to proofread, errors can occur which can change the documentation meaning.  Shela Leff MD [facility] Pager [phone]  If 7PM-7AM, please contact night-coverage [email or web address] Password [email or web address]  10/29/2018, 1:03 AM

## 2018-10-28 NOTE — ED Provider Notes (Signed)
[facility] EMERGENCY DEPARTMENT Provider Note   CSN: [id number] Arrival date & time: 10/28/18  1958    History   Chief Complaint Chief Complaint  Patient presents with  . Foot Pain    HPI KARSON GORDAN is a 58 y.o. male with history of paroxysmal A. fib, diabetes mellitus, bipolar disorder, anxiety, hypertension, homelessness presents for evaluation of acute onset, persistent bleeding to left foot wound beginning today.  Reports that bleeding began after ambulating.  He underwent transmetatarsal amputation of left foot on 09/28/2018 by Dr. [name], recently admitted for sepsis earlier this month.  Reports he is not currently on any antibiotics.  He does note some pain to the left foot which he describes as a tenderness that worsens with palpation.  Reports the pain is "better than it was before ".  Denies nausea or vomiting.  No fevers.  Has not been able to take any of his medications for the last week.  Does not have a PCP here.  Is unsure when he is supposed to follow-up with Dr. [name].  Not currently anticoagulated.     The history is provided by the patient.    Past Medical History:  Diagnosis Date  . Anxiety   . Depressed bipolar disorder (HCC)   . Diabetes mellitus without complication (HCC)   . Hypertension     Patient Active Problem List   Diagnosis Date Noted  . Hx of BKA, right (HCC)   . PAF (paroxysmal atrial fibrillation) (HCC)   . Bacteremia due to Gram-positive bacteria   . Hypokalemia   . Hypomagnesemia   . Sepsis without acute organ dysfunction (HCC) 10/05/2018  . Depressed bipolar disorder (HCC) 10/05/2018  . Hypertension 10/05/2018  . Hyperbilirubinemia 10/05/2018  . S/P transmetatarsal amputation of foot, 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  . Osteomyelitis of toe of left foot (HCC)   . Wound infection 05/07/2018  . Homelessness 05/07/2018  . Diabetes mellitus type 2, noninsulin dependent (HCC) 05/07/2018  . Acute osteomyelitis involving lower leg,  right (HCC) 05/07/2018    Past Surgical History:  Procedure Laterality Date  . AMPUTATION Left 09/28/2018   Procedure: LEFT TRANSMETATARSAL AMPUTATION;  Surgeon: Nadara Mustard, MD;  Location[facility] OR;  Service: Orthopedics;  Laterality: Left;  . BELOW KNEE LEG AMPUTATION Right         Home Medications    Prior to Admission medications   Medication Sig Start Date End Date Taking? Authorizing Provider  acetaminophen (TYLENOL) 325 MG tablet Take 2 tablets (650 mg total) by mouth every 6 (six) hours as needed for mild pain (or Fever >/= 101). 05/10/18   Danford, Earl Lites, MD  feeding supplement, GLUCERNA SHAKE, (GLUCERNA SHAKE) LIQD Take 237 mLs by mouth 2 (two) times daily between meals. Patient not taking: Reported on 10/05/2018 05/10/18   Alberteen Sam, MD  metFORMIN (GLUCOPHAGE) 1000 MG tablet Take 1 tablet (1,000 mg total) by mouth 2 (two) times daily with a meal. 08/28/18   Ward, Layla Maw, DO  Multiple Vitamin (MULTIVITAMIN WITH MINERALS) TABS tablet Take 1 tablet by mouth daily. Patient not taking: Reported on 10/05/2018 05/11/18   Alberteen Sam, MD  QUEtiapine (SEROQUEL) 400 MG tablet Take 1 tablet (400 mg total) by mouth at bedtime. 08/28/18   Ward, Layla Maw, DO  tamsulosin (FLOMAX) 0.4 MG CAPS capsule Take 1 capsule (0.4 mg total) by mouth daily after supper for 30 days. 10/18/18 11/17/18  Dimple Nanas, MD  traZODone (DESYREL) 100 MG tablet  Take 1 tablet (100 mg total) by mouth at bedtime. 08/28/18   Ward, Layla Maw, DO    Family History Family History  Problem Relation Age of Onset  . Hypertension Other   . Diabetes Mellitus II Other     Social History Social History   Tobacco Use  . Smoking status: Current Every Day Smoker    Types: Cigarettes  . Smokeless tobacco: Never Used  Substance Use Topics  . Alcohol use: Yes    Comment: occasional  . Drug use: Not Currently     Allergies   Patient has no known allergies.   Review of Systems  Review of Systems  Constitutional: Negative for fever.  Gastrointestinal: Negative for nausea and vomiting.  Skin: Positive for wound.  Neurological: Negative for weakness and numbness.  All other systems reviewed and are negative.    Physical Exam Updated Vital Signs BP 131/81   Pulse 68   Temp 98.7 F (37.1 C) (Oral)   Resp 14   Ht  (1.93 m)   Wt 117.9 kg   SpO2 98%   BMI 31.65 kg/m   Physical Exam Vitals signs and nursing note reviewed.  Constitutional:      General: He is not in acute distress.    Appearance: He is well-developed.  HENT:     Head: Normocephalic and atraumatic.  Eyes:     General:        Right eye: No discharge.        Left eye: No discharge.     Conjunctiva/sclera: Conjunctivae normal.  Neck:     Vascular: No JVD.     Trachea: No tracheal deviation.  Cardiovascular:     Rate and Rhythm: Normal rate.     Pulses: Normal pulses.     Comments: Right BKA.  2+ left DP/PT pulses present Pulmonary:     Effort: Pulmonary effort is normal.  Abdominal:     General: There is no distension.  Musculoskeletal: Normal range of motion.  Skin:    General: Skin is warm and dry.     Findings: No erythema.     Comments: See below images.  Duskiness to the dorsum of the left foot.  No active bleeding, unable to express any purulence or blood on palpation.  Sutures still in place.  Neurological:     Mental Status: He is alert.  Psychiatric:        Behavior: Behavior normal.          ED Treatments / Results  Labs (all labs ordered are listed, but only abnormal results are displayed) Labs Reviewed  CBC WITH DIFFERENTIAL/PLATELET - Abnormal; Notable for the following components:      Result Value   RBC 3.51 (*)    Hemoglobin 10.9 (*)    HCT 33.9 (*)    Platelets 125 (*)    All other components within normal limits  COMPREHENSIVE METABOLIC PANEL - Abnormal; Notable for the following components:   Glucose, Bld 148 (*)    Alkaline Phosphatase  128 (*)    Total Bilirubin 2.1 (*)    All other components within normal limits  CBG MONITORING, ED - Abnormal; Notable for the following components:   Glucose-Capillary 165 (*)    All other components within normal limits  LACTIC ACID, PLASMA  C-REACTIVE PROTEIN  LACTIC ACID, PLASMA  SEDIMENTATION RATE    EKG None  Radiology Dg Foot Complete Left  Result Date: 10/28/2018 CLINICAL DATA:  Left foot bleeding near  surgical amputation site. Pain at surgical site. Rule out osteomyelitis. EXAM: LEFT FOOT - COMPLETE 3+ VIEW COMPARISON:  September 24, 2018 and October 04, 2018 FINDINGS: Three linear foreign bodies identified in the soft tissues, unchanged since September 24, 2018. The amputation sites were sharp on the previous study. The amputation sites are less sharp today including the distal aspects of the remaining second, third, fourth, and fifth metatarsals. The distal second metatarsal appears more foreshortened today. IMPRESSION: 1. Bony erosion at the distal aspect of the remaining second, third, fourth, and fifth metatarsals concerning for osteomyelitis. 2. Stable foreign bodies in the soft tissues. Electronically Signed   By: Gerome Sam III M.D   On: 10/28/2018 21:30    Procedures Procedures (including critical care time)  Medications Ordered in ED Medications - No data to display   Initial Impression / Assessment and Plan / ED Course  I have reviewed the triage vital signs and the nursing notes.  Pertinent labs & imaging results that were available during my care of the patient were reviewed by me and considered in my medical decision making (see chart for details).        Patient presenting for evaluation of bleeding and pain from surgical site.  He is afebrile, vital signs are stable.  He is nontoxic in appearance.  Has no access to medications for his diabetes or other medical problems as he recently relocated from [place] and does not have a PCP here.  Lab work  significant for elevated CBG of 165.  No leukocytosis, lactate negative.  Does not appear to be septic at this time.  Radiographs concerning for osteomyelitis of the left foot.  Spoke with Dr. Roda Shutters with [facility] orthopedics who recommends admission and will inform Dr. [name], the patient's surgeon, with plan to evaluate him in the morning.  Spoke with Dr. Loney Loh with [facility] hospitalist service who agrees to assume care of patient and bring the patient into the hospital for further evaluation and management.  Will hold on antibiotics at this time.  Final Clinical Impressions(s) / ED Diagnoses   Final diagnoses:  Other acute osteomyelitis of left foot [facility])    ED Discharge Orders    None       Bennye Alm 10/28/18 2323    Linwood Dibbles, MD 10/30/18 [phone]

## 2018-10-28 NOTE — ED Notes (Signed)
ED TO INPATIENT HANDOFF REPORT  ED Nurse Name and Phone #: Marcelina Morel & Baird Lyons [phone]  S Name/Age/Gender Cory Palmer 58 y.o. male Room/Bed: 006C/006C  Code Status   Code Status: Prior  Home/SNF/Other homeless Patient oriented to: self, place, time and situation Is this baseline? Yes   Triage Complete: Triage complete  Chief Complaint left foot injury   Triage Note Pt presents with c/o his left foot bleeding on/near surgical amputation site. Pt reports pain at surgical site. Pt denies dizziness, lightheadedness and N/V/D. Pt has hx of diabetes and bipolar and reports he has been without all his meds over a week.   Allergies No Known Allergies  Level of Care/Admitting Diagnosis ED Disposition    ED Disposition Condition Comment   Admit  Hospital Area: [facility] [[id number]]  Level of Care: Med-Surg [16]  Diagnosis: Osteomyelitis [facility]) [[id number]]  Admitting Physician: John Giovanni [[id number]]  Attending Physician: John Giovanni [[id number]]  Estimated length of stay: past midnight tomorrow  Certification:: I certify this patient will need inpatient services for at least 2 midnights  PT Class (Do Not Modify): Inpatient [101]  PT Acc Code (Do Not Modify): Private [1]       B Medical/Surgery History Past Medical History:  Diagnosis Date  . Anxiety   . Depressed bipolar disorder (HCC)   . Diabetes mellitus without complication (HCC)   . Hypertension    Past Surgical History:  Procedure Laterality Date  . AMPUTATION Left 09/28/2018   Procedure: LEFT TRANSMETATARSAL AMPUTATION;  Surgeon: Nadara Mustard, MD;  Location: [facility] OR;  Service: Orthopedics;  Laterality: Left;  . BELOW KNEE LEG AMPUTATION Right      A IV Location/Drains/Wounds Patient Lines/Drains/Airways Status   Active Line/Drains/Airways    Name:   Placement date:   Placement time:   Site:   Days:   Peripheral IV 10/28/18 Right Hand   10/28/18    2237    Hand   less than 1   Wound  / Incision (Open or Dehisced) 10/08/18 Other (Comment) Foot Anterior;Left   10/08/18    0941    Foot   20          Intake/Output Last 24 hours No intake or output data in the 24 hours ending 10/28/18 2334  Labs/Imaging Results for orders placed or performed during the hospital encounter of 10/28/18 (from the past 48 hour(s))  POC CBG, ED     Status: Abnormal   Collection Time: 10/28/18  8:46 PM  Result Value Ref Range   Glucose-Capillary 165 (H) 70 - 99 mg/dL  CBC with Differential     Status: Abnormal   Collection Time: 10/28/18  9:30 PM  Result Value Ref Range   WBC 8.4 4.0 - 10.5 K/uL   RBC 3.51 (L) 4.22 - 5.81 MIL/uL   Hemoglobin 10.9 (L) 13.0 - 17.0 g/dL   HCT 67.6 (L) 19.5 - 09.3 %   MCV 96.6 80.0 - 100.0 fL   MCH 31.1 26.0 - 34.0 pg   MCHC 32.2 30.0 - 36.0 g/dL   RDW 26.7 12.4 - 58.0 %   Platelets 125 (L) 150 - 400 K/uL   nRBC 0.0 0.0 - 0.2 %   Neutrophils Relative % 70 %   Neutro Abs 5.9 1.7 - 7.7 K/uL   Lymphocytes Relative 16 %   Lymphs Abs 1.4 0.7 - 4.0 K/uL   Monocytes Relative 8 %   Monocytes Absolute 0.7 0.1 - 1.0 K/uL   Eosinophils  Relative 5 %   Eosinophils Absolute 0.4 0.0 - 0.5 K/uL   Basophils Relative 1 %   Basophils Absolute 0.0 0.0 - 0.1 K/uL   Immature Granulocytes 0 %   Abs Immature Granulocytes 0.02 0.00 - 0.07 K/uL    Comment: Performed at [facility], [address], [place], [place] [address]  Comprehensive metabolic panel     Status: Abnormal   Collection Time: 10/28/18  9:30 PM  Result Value Ref Range   Sodium 135 135 - 145 mmol/L   Potassium 3.5 3.5 - 5.1 mmol/L   Chloride 106 98 - 111 mmol/L   CO2 22 22 - 32 mmol/L   Glucose, Bld 148 (H) 70 - 99 mg/dL   BUN 6 6 - 20 mg/dL   Creatinine, Ser 1.91 0.61 - 1.24 mg/dL   Calcium 9.0 8.9 - 47.8 mg/dL   Total Protein 7.2 6.5 - 8.1 g/dL   Albumin 3.5 3.5 - 5.0 g/dL   AST 23 15 - 41 U/L   ALT 12 0 - 44 U/L   Alkaline Phosphatase 128 (H) 38 - 126 U/L   Total Bilirubin 2.1 (H) 0.3 -  1.2 mg/dL   GFR calc non Af Amer >60 >60 mL/min   GFR calc Af Amer >60 >60 mL/min   Anion gap 7 5 - 15    Comment: Performed at [facility], [address], [place], [place] [address]  Lactic acid, plasma     Status: None   Collection Time: 10/28/18  9:30 PM  Result Value Ref Range   Lactic Acid, Venous 1.0 0.5 - 1.9 mmol/L    Comment: Performed at [facility], [address] [place], [place] [address]  C-reactive protein     Status: None   Collection Time: 10/28/18  9:30 PM  Result Value Ref Range   CRP <0.8 <1.0 mg/dL    Comment: Performed at [facility], [address], [place], [place] [address]   Dg Foot Complete Left  Result Date: 10/28/2018 CLINICAL DATA:  Left foot bleeding near surgical amputation site. Pain at surgical site. Rule out osteomyelitis. EXAM: LEFT FOOT - COMPLETE 3+ VIEW COMPARISON:  September 24, 2018 and October 04, 2018 FINDINGS: Three linear foreign bodies identified in the soft tissues, unchanged since September 24, 2018. The amputation sites were sharp on the previous study. The amputation sites are less sharp today including the distal aspects of the remaining second, third, fourth, and fifth metatarsals. The distal second metatarsal appears more foreshortened today. IMPRESSION: 1. Bony erosion at the distal aspect of the remaining second, third, fourth, and fifth metatarsals concerning for osteomyelitis. 2. Stable foreign bodies in the soft tissues. Electronically Signed   By: Gerome Sam III M.D   On: 10/28/2018 21:30    Pending Labs Unresulted Labs (From admission, onward)    Start     Ordered   10/28/18 2229  Sedimentation rate  ONCE - STAT,   STAT     10/28/18 2228   10/28/18 2057  Lactic acid, plasma  Now then every 2 hours,   STAT     10/28/18 2059          Vitals/Pain Today's Vitals   10/28/18 2022 10/28/18 2023 10/28/18 2024 10/28/18 2324  BP:   131/81 136/73  Pulse:    65  Resp:    16  Temp:      TempSrc:      SpO2:  99%  Weight:  117.9 kg    Height:  6\' 4"  (1.93 m)    PainSc: 3        Isolation Precautions No active isolations  Medications Medications - No data to display  Mobility walks with person assist Moderate fall risk   Focused Assessments musculoskeletal Pt had amputation of last two toes on L foot. Pt has sutures in place from surgery over 1 month ago. Pt had some bleeding to wound earlier, bleeding controlled.   R Recommendations: See Admitting Provider Note  Report given to:   Additional Notes:

## 2018-10-29 ENCOUNTER — Encounter (HOSPITAL_COMMUNITY): Payer: Self-pay | Admitting: General Practice

## 2018-10-29 ENCOUNTER — Inpatient Hospital Stay (HOSPITAL_COMMUNITY): Payer: Medicare Other

## 2018-10-29 DIAGNOSIS — N400 Enlarged prostate without lower urinary tract symptoms: Secondary | ICD-10-CM

## 2018-10-29 DIAGNOSIS — L97521 Non-pressure chronic ulcer of other part of left foot limited to breakdown of skin: Secondary | ICD-10-CM

## 2018-10-29 DIAGNOSIS — I480 Paroxysmal atrial fibrillation: Secondary | ICD-10-CM

## 2018-10-29 DIAGNOSIS — S88111A Complete traumatic amputation at level between knee and ankle, right lower leg, initial encounter: Secondary | ICD-10-CM

## 2018-10-29 DIAGNOSIS — R17 Unspecified jaundice: Secondary | ICD-10-CM

## 2018-10-29 LAB — HEPATIC FUNCTION PANEL
ALT: 12 U/L (ref 0–44)
AST: 20 U/L (ref 15–41)
Albumin: 3.4 g/dL — ABNORMAL LOW (ref 3.5–5.0)
Alkaline Phosphatase: 123 U/L (ref 38–126)
Bilirubin, Direct: 0.5 mg/dL — ABNORMAL HIGH (ref 0.0–0.2)
Indirect Bilirubin: 1.4 mg/dL — ABNORMAL HIGH (ref 0.3–0.9)
Total Bilirubin: 1.9 mg/dL — ABNORMAL HIGH (ref 0.3–1.2)
Total Protein: 6.9 g/dL (ref 6.5–8.1)

## 2018-10-29 LAB — RESPIRATORY PANEL BY PCR
Adenovirus: NOT DETECTED
Bordetella pertussis: NOT DETECTED
CORONAVIRUS NL63-RVPPCR: NOT DETECTED
Chlamydophila pneumoniae: NOT DETECTED
Coronavirus 229E: NOT DETECTED
Coronavirus HKU1: NOT DETECTED
Coronavirus OC43: NOT DETECTED
Influenza A: NOT DETECTED
Influenza B: NOT DETECTED
METAPNEUMOVIRUS-RVPPCR: NOT DETECTED
Mycoplasma pneumoniae: NOT DETECTED
PARAINFLUENZA VIRUS 3-RVPPCR: NOT DETECTED
PARAINFLUENZA VIRUS 4-RVPPCR: NOT DETECTED
Parainfluenza Virus 1: NOT DETECTED
Parainfluenza Virus 2: NOT DETECTED
Respiratory Syncytial Virus: NOT DETECTED
Rhinovirus / Enterovirus: DETECTED — AB

## 2018-10-29 LAB — CBC
HCT: 30.3 % — ABNORMAL LOW (ref 39.0–52.0)
Hemoglobin: 10.4 g/dL — ABNORMAL LOW (ref 13.0–17.0)
MCH: 32.3 pg (ref 26.0–34.0)
MCHC: 34.3 g/dL (ref 30.0–36.0)
MCV: 94.1 fL (ref 80.0–100.0)
Platelets: 105 10*3/uL — ABNORMAL LOW (ref 150–400)
RBC: 3.22 MIL/uL — ABNORMAL LOW (ref 4.22–5.81)
RDW: 14 % (ref 11.5–15.5)
WBC: 6.9 10*3/uL (ref 4.0–10.5)
nRBC: 0 % (ref 0.0–0.2)

## 2018-10-29 LAB — INFLUENZA PANEL BY PCR (TYPE A & B)
Influenza A By PCR: NEGATIVE
Influenza B By PCR: NEGATIVE

## 2018-10-29 LAB — GLUCOSE, CAPILLARY
GLUCOSE-CAPILLARY: 180 mg/dL — AB (ref 70–99)
Glucose-Capillary: 129 mg/dL — ABNORMAL HIGH (ref 70–99)
Glucose-Capillary: 165 mg/dL — ABNORMAL HIGH (ref 70–99)
Glucose-Capillary: 129 mg/dL — ABNORMAL HIGH (ref 70–99)
Glucose-Capillary: 119 mg/dL — ABNORMAL HIGH (ref 70–99)

## 2018-10-29 MED ORDER — VANCOMYCIN HCL 10 G IV SOLR
1250.0000 mg | Freq: Two times a day (BID) | INTRAVENOUS | Status: DC
  Administered 2018-10-29 – 2018-11-02 (×9): 1250 mg via INTRAVENOUS
  Filled 2018-10-29 (×11): qty 1250

## 2018-10-29 MED ORDER — QUETIAPINE FUMARATE 400 MG PO TABS
400.0000 mg | ORAL_TABLET | Freq: Every day | ORAL | Status: DC
  Administered 2018-10-29: 400 mg via ORAL
  Filled 2018-10-29: qty 1

## 2018-10-29 MED ORDER — TRAZODONE HCL 100 MG PO TABS
100.0000 mg | ORAL_TABLET | Freq: Every evening | ORAL | Status: DC | PRN
  Administered 2018-10-29: 100 mg via ORAL
  Filled 2018-10-29: qty 1

## 2018-10-29 MED ORDER — GUAIFENESIN ER 600 MG PO TB12
1200.0000 mg | ORAL_TABLET | Freq: Two times a day (BID) | ORAL | Status: DC
  Administered 2018-10-29 – 2018-11-02 (×8): 1200 mg via ORAL
  Filled 2018-10-29 (×8): qty 2

## 2018-10-29 MED ORDER — ACETAMINOPHEN 650 MG RE SUPP: 650.0000 mg | Freq: Four times a day (QID) | RECTAL | Status: DC | PRN

## 2018-10-29 MED ORDER — TRAZODONE HCL 100 MG PO TABS
100.0000 mg | ORAL_TABLET | Freq: Once | ORAL | Status: AC
  Administered 2018-10-29: 100 mg via ORAL
  Filled 2018-10-29: qty 1

## 2018-10-29 MED ORDER — PHENOL 1.4 % MT LIQD
1.0000 | OROMUCOSAL | Status: DC | PRN
  Administered 2018-10-29 – 2018-10-30 (×2): 1 via OROMUCOSAL
  Filled 2018-10-29: qty 177

## 2018-10-29 MED ORDER — ACETAMINOPHEN 325 MG PO TABS
650.0000 mg | ORAL_TABLET | Freq: Four times a day (QID) | ORAL | Status: DC | PRN
  Administered 2018-10-30: 650 mg via ORAL
  Filled 2018-10-29: qty 2

## 2018-10-29 MED ORDER — LORATADINE 10 MG PO TABS
10.0000 mg | ORAL_TABLET | Freq: Every day | ORAL | Status: DC | PRN
  Administered 2018-10-29: 10 mg via ORAL
  Filled 2018-10-29: qty 1

## 2018-10-29 MED ORDER — TRAZODONE HCL 100 MG PO TABS: 100.0000 mg | ORAL_TABLET | Freq: Every day | ORAL | Status: DC

## 2018-10-29 MED ORDER — INSULIN ASPART 100 UNIT/ML ~~LOC~~ SOLN
0.0000 [IU] | Freq: Three times a day (TID) | SUBCUTANEOUS | Status: DC
  Administered 2018-10-29: 1 [IU] via SUBCUTANEOUS
  Administered 2018-10-29: 2 [IU] via SUBCUTANEOUS
  Administered 2018-10-29 – 2018-10-31 (×2): 1 [IU] via SUBCUTANEOUS
  Administered 2018-10-31 – 2018-11-02 (×2): 2 [IU] via SUBCUTANEOUS

## 2018-10-29 MED ORDER — TAMSULOSIN HCL 0.4 MG PO CAPS
0.4000 mg | ORAL_CAPSULE | Freq: Every day | ORAL | Status: DC
  Administered 2018-10-29 – 2018-11-01 (×4): 0.4 mg via ORAL
  Filled 2018-10-29 (×4): qty 1

## 2018-10-29 MED ORDER — QUETIAPINE FUMARATE 400 MG PO TABS: 400.0000 mg | ORAL_TABLET | Freq: Every day | ORAL | Status: DC

## 2018-10-29 NOTE — Consult Note (Signed)
ORTHOPAEDIC CONSULTATION  REQUESTING PHYSICIAN: Cathren Harsh, MD  Chief Complaint: Ulceration left transmetatarsal amputation and right transtibial amputation.  HPI: Cory Palmer is a 58 y.o. male who presents with draining ulcer left transmetatarsal amputation and abrasion N bearing ulcer right transtibial amputation.  Past Medical History:  Diagnosis Date  . Anxiety   . Depressed bipolar disorder (HCC)   . Diabetes mellitus without complication (HCC)   . Hypertension    Past Surgical History:  Procedure Laterality Date  . AMPUTATION Left 09/28/2018   Procedure: LEFT TRANSMETATARSAL AMPUTATION;  Surgeon: Nadara Mustard, MD;  Location: [facility] OR;  Service: Orthopedics;  Laterality: Left;  . BELOW KNEE LEG AMPUTATION Right    Social History   Socioeconomic History  . Marital status: Single    Spouse name: Not on file  . Number of children: Not on file  . Years of education: Not on file  . Highest education level: Not on file  Occupational History  . Not on file  Social Needs  . Financial resource strain: Patient refused  . Food insecurity:    Worry: Patient refused    Inability: Patient refused  . Transportation needs:    Medical: Patient refused    Non-medical: Patient refused  Tobacco Use  . Smoking status: Current Every Day Smoker    Types: Cigarettes  . Smokeless tobacco: Never Used  Substance and Sexual Activity  . Alcohol use: Yes    Comment: occasional  . Drug use: Not Currently  . Sexual activity: Not on file  Lifestyle  . Physical activity:    Days per week: Patient refused    Minutes per session: Patient refused  . Stress: Not on file  Relationships  . Social connections:    Talks on phone: Not on file    Gets together: Not on file    Attends religious service: Not on file    Active member of club or organization: Not on file    Attends meetings of clubs or organizations: Not on file    Relationship status: Not on file  Other Topics Concern   . Not on file  Social History Narrative  . Not on file   Family History  Problem Relation Age of Onset  . Hypertension Other   . Diabetes Mellitus II Other    - negative except otherwise stated in the family history section No Known Allergies Prior to Admission medications   Medication Sig Start Date End Date Taking? Authorizing Provider  acetaminophen (TYLENOL) 325 MG tablet Take 2 tablets (650 mg total) by mouth every 6 (six) hours as needed for mild pain (or Fever >/= 101). 05/10/18   Danford, Earl Lites, MD  feeding supplement, GLUCERNA SHAKE, (GLUCERNA SHAKE) LIQD Take 237 mLs by mouth 2 (two) times daily between meals. Patient not taking: Reported on 10/05/2018 05/10/18   Alberteen Sam, MD  metFORMIN (GLUCOPHAGE) 1000 MG tablet Take 1 tablet (1,000 mg total) by mouth 2 (two) times daily with a meal. 08/28/18   Ward, Layla Maw, DO  Multiple Vitamin (MULTIVITAMIN WITH MINERALS) TABS tablet Take 1 tablet by mouth daily. Patient not taking: Reported on 10/05/2018 05/11/18   Alberteen Sam, MD  QUEtiapine (SEROQUEL) 400 MG tablet Take 1 tablet (400 mg total) by mouth at bedtime. 08/28/18   Ward, Layla Maw, DO  tamsulosin (FLOMAX) 0.4 MG CAPS capsule Take 1 capsule (0.4 mg total) by mouth daily after supper for 30 days. 10/18/18 11/17/18  Amin, Ankit  Chirag, MD  traZODone (DESYREL) 100 MG tablet Take 1 tablet (100 mg total) by mouth at bedtime. 08/28/18   Ward, Layla Maw, DO   Dg Foot Complete Left  Result Date: 10/28/2018 CLINICAL DATA:  Left foot bleeding near surgical amputation site. Pain at surgical site. Rule out osteomyelitis. EXAM: LEFT FOOT - COMPLETE 3+ VIEW COMPARISON:  September 24, 2018 and October 04, 2018 FINDINGS: Three linear foreign bodies identified in the soft tissues, unchanged since September 24, 2018. The amputation sites were sharp on the previous study. The amputation sites are less sharp today including the distal aspects of the remaining second, third,  fourth, and fifth metatarsals. The distal second metatarsal appears more foreshortened today. IMPRESSION: 1. Bony erosion at the distal aspect of the remaining second, third, fourth, and fifth metatarsals concerning for osteomyelitis. 2. Stable foreign bodies in the soft tissues. Electronically Signed   By: Gerome Sam III M.D   On: 10/28/2018 21:30   - pertinent xrays, CT, MRI studies were reviewed and independently interpreted  Positive ROS: All other systems have been reviewed and were otherwise negative with the exception of those mentioned in the HPI and as above.  Physical Exam: General: Alert, no acute distress Psychiatric: Patient is competent for consent with normal mood and affect Lymphatic: No axillary or cervical lymphadenopathy Cardiovascular: No pedal edema Respiratory: No cyanosis, no use of accessory musculature GI: No organomegaly, abdomen is soft and non-tender    Images:  @ENCIMAGES @  Labs:  Lab Results  Component Value Date   HGBA1C 6.6 (H) 09/28/2018   HGBA1C 7.5 (H) 05/08/2018   ESRSEDRATE 33 (H) 10/28/2018   ESRSEDRATE 16 05/07/2018   CRP <0.8 10/28/2018   CRP <0.8 05/07/2018   REPTSTATUS 10/11/2018 FINAL 10/06/2018   REPTSTATUS 10/11/2018 FINAL 10/06/2018   CULT  10/06/2018    NO GROWTH 5 DAYS Performed at [facility], [address], [place], [place] [address]    CULT  10/06/2018    NO GROWTH 5 DAYS Performed at [facility], [address] [place], [place] [address]    [facility] STAPHYLOCOCCUS AUREUS 10/04/2018   LABORGA STREPTOCOCCUS GROUP G 10/04/2018    Lab Results  Component Value Date   ALBUMIN 3.4 (L) 10/29/2018   ALBUMIN 3.5 10/28/2018   ALBUMIN 2.4 (L) 10/08/2018   PREALBUMIN 11.2 (L) 05/08/2018    Neurologic: Patient does not have protective sensation bilateral lower extremities.   MUSCULOSKELETAL:   Skin: Examination patient has swelling in the right foot.  There is a strong dorsalis pedis pulse.  There is a  small wound about 5 mm in diameter with clear serosanguineous drainage.  There is swelling but no ascending cellulitis.  Examination of the right transtibial amputation patient has some areas of abrasion but no full-thickness skin defect.  Patient was told not to wear his sock with the prosthetic liner.  Patient denies any systemic symptoms.  Assessment: Assessment: Clear serosanguineous drainage from the left transmetatarsal amputation with end bearing pressure area on the right transtibial amputation.  Plan: Plan: Agree with IV antibiotics, patient should be nonweightbearing on the left transmetatarsal amputation.  I will follow-up in the office after discharge.  Will hold on surgery at this time.  Thank you for the consult and the opportunity to see Mr. Argel Cartee, MD [facility] [phone] 7:08 AM

## 2018-10-29 NOTE — Progress Notes (Signed)
Observed a dry cough since start of shift. Pt c/o sore throat, cough, and sweating. VSS and documented with assessment. Notified MD. Initiated droplet precautions.

## 2018-10-29 NOTE — Plan of Care (Signed)
  Problem: Coping: Goal: Level of anxiety will decrease Outcome: Progressing   Problem: Pain Managment: Goal: General experience of comfort will improve Outcome: Progressing   

## 2018-10-29 NOTE — Progress Notes (Signed)
[facility]                                                                              Patient Demographics  [place] Cory Palmer, is a 58 y.o. male, DOB - 30-Dec-1960, AJO:[id number]  Admit date - 10/28/2018   Admitting Physician John Giovanni, MD  Outpatient Primary MD for the patient is Patient, [name] Per  Outpatient specialists:   LOS - 1  days   Medical records reviewed and are as summarized below:    Chief Complaint  Patient presents with  . Foot Pain       Brief summary   Cory Palmer is a 58 y.o. male with anxiety, bipolar disorder with depression, type 2 diabetes, hypertension, paroxysmal atrial fibrillation, cocaine use, right-sided BKA, recent transmetatarsal amputation of left foot on 2/28 by Dr. [name] presented to ED for evaluation bleeding from his foot wound surgical site for the past 5 days.  He did not noticed any pus drainage, also having some slight pain in this foot.  States he had not had a chance to see Dr. [name] since his recent hospital discharge due to transportation problems.  He finished his antibiotic course but has not taken any of his diabetes medications since his hospital discharge.  Denied having any fevers, chills, chest pain, cough, shortness of breath, abdominal pain, nausea, or vomiting. Patient recently had left foot transmetatarsal amputation on 2/28.  He was admitted to the hospital 3/5 to 3/19 for sepsis secondary to MSSA/strep bacteremia.  No osteomyelitis.  ID and orthopedics were consulted.  He was treated with IV Ancef during the hospitalization and switched to Keflex. Last day of antibiotics 3/20.  TTE performed 3/6 negative for endocarditis.   Assessment & Plan    Principal Problem:  Osteomyelitis (HCC) of the left foot with recent transmetatarsal amputation of left foot on 2/28 -Afebrile, no leukocytosis, normal lactic acid, no sepsis at this time. -X-ray of the left foot showed bony erosion at the distal aspect of the  remaining 2nd,3rd,4th and 5th metatarsals concerning for osteomyelitis -Orthopedics consulted, seen by Dr. [name] -Recommended IV antibiotics, nonweightbearing on the left, hold off on surgery at this time -Will place on IV vancomycin -Patient had MSSA/strep bacteremia during previous admission, was treated with IV Ancef and then switched to Keflex, completed antibiotics on 3/20 -Blood cultures so far negative, if no surgery planned, will possibly DC on oral doxycycline when cleared by Dr. [name]   Active problems Mildly elevated total bilirubin -Right upper quadrant ultrasound pending, LFTs normalized  Diabetes mellitus type 2 - A1c 6.6 on 2/28, continue sliding scale   Paroxysmal Afib  -Currently normal sinus rhythm, per decreasing DC summary was seen by cardiology during the previous hospitalization.  EF 6065%.  Not a candidate for long-term anticoagulation due to anemia/thrombocytopenia.  BPH Continue Flomax  Code Status: Full code DVT Prophylaxis:   SCD's Family Communication: Discussed in detail with the patient, all imaging results, lab results explained to the patient   Disposition Plan: Possible DC in next 24 to 48 hours, once cleared by orthopedics  Time Spent in minutes   35 minutes  Procedures:  None  Consultants:   Orthopedics  Antimicrobials:   Anti-infectives (From admission, onward)   None          Medications  Scheduled Meds: . insulin aspart  0-9 Units Subcutaneous TID WC  . tamsulosin  0.4 mg Oral QPC supper   Continuous Infusions: PRN Meds:.acetaminophen **OR** acetaminophen      Subjective:   Brookson Morisset was seen and examined today.  Currently no complaints, pain controlled. Patient denies dizziness, chest pain, shortness of breath, abdominal pain, N/V/D/C.  No fevers no worsening discharge from the left forefoot amputation site.  No acute events overnight.    Objective:   Vitals:   10/28/18 2024 10/28/18 2324 10/29/18 0028 10/29/18  0424  BP: 131/81 136/73 136/76 113/62  Pulse:  65 65 (!) 55  Resp:  16 15 16   Temp:   98.3 F (36.8 C) 98.2 F (36.8 C)  TempSrc:   Oral Oral  SpO2:  99%  95%  Weight:      Height:       No intake or output data in the 24 hours ending 10/29/18 0921   Wt Readings from Last 3 Encounters:  10/28/18 117.9 kg  10/09/18 104.5 kg  10/01/18 113.4 kg     Exam  General: Alert and oriented x 3, NAD  Eyes:   HEENT:  Atraumatic, normocephalic  Cardiovascular: S1 S2 auscultated, Regular rate and rhythm.  Respiratory: Clear to auscultation bilaterally, no wheezing, rales or rhonchi  Gastrointestinal: Soft, nontender, nondistended, + bowel sounds  Ext:  right BKA, left TMA  Neuro: No new deficits  Musculoskeletal: No digital cyanosis, clubbing  Skin: Left forefoot appears edematous with yellowish serosanguineous drainage from the left foot amputation site  Psych: Normal affect and demeanor, alert and oriented x3    Data Reviewed:  I have personally reviewed following labs and imaging studies  Micro Results No results found for this or any previous visit (from the past 240 hour(s)).  Radiology Reports Dg Chest 2 View  Result Date: 10/04/2018 CLINICAL DATA:  Cough for 4 days EXAM: CHEST - 2 VIEW COMPARISON:  05/08/2018 FINDINGS: Mild cardiomegaly. No overt pulmonary edema. Both lungs are clear. The visualized skeletal structures are unremarkable. IMPRESSION: No active cardiopulmonary disease.  Mild cardiomegaly Electronically Signed   By: Jasmine Pang M.D.   On: 10/04/2018 22:52   Dg Foot Complete Left  Result Date: 10/28/2018 CLINICAL DATA:  Left foot bleeding near surgical amputation site. Pain at surgical site. Rule out osteomyelitis. EXAM: LEFT FOOT - COMPLETE 3+ VIEW COMPARISON:  September 24, 2018 and October 04, 2018 FINDINGS: Three linear foreign bodies identified in the soft tissues, unchanged since September 24, 2018. The amputation sites were sharp on the previous  study. The amputation sites are less sharp today including the distal aspects of the remaining second, third, fourth, and fifth metatarsals. The distal second metatarsal appears more foreshortened today. IMPRESSION: 1. Bony erosion at the distal aspect of the remaining second, third, fourth, and fifth metatarsals concerning for osteomyelitis. 2. Stable foreign bodies in the soft tissues. Electronically Signed   By: Gerome Sam III M.D   On: 10/28/2018 21:30   Dg Foot Complete Left  Result Date: 10/05/2018 CLINICAL DATA:  Recent foot surgery possible wound infection. EXAM: LEFT FOOT - COMPLETE 3+ VIEW COMPARISON:  Preoperative radiograph 09/24/2018 FINDINGS: Post transmetatarsal amputation of foot. The resection margins are sharp. No periosteal reaction or bony destructive change. Three linear foreign bodies projecting over the foot  are unchanged prior exam, likely in the soft tissues, less likely external to the patient given persistence on pre and postoperative imaging. No tracking soft tissue air. Overlying dressing in place. IMPRESSION: Post transmetatarsal amputation of the foot. No radiographic findings of osteomyelitis. Electronically Signed   By: Narda RutherfordMelanie  Sanford M.D.   On: 10/05/2018 00:04   Vas [place] Lower Extremity Venous (dvt)  Result Date: 10/09/2018  Lower Venous Study Indications: Edema.  Performing Technologist: Blanch MediaMegan Riddle RVS  Examination Guidelines: A complete evaluation includes B-mode imaging, spectral Doppler, color Doppler, and power Doppler as needed of all accessible portions of each vessel. Bilateral testing is considered an integral part of a complete examination. Limited examinations for reoccurring indications may be performed as noted.  Right Venous Findings: +---+---------------+---------+-----------+----------+-------+    CompressibilityPhasicitySpontaneityPropertiesSummary +---+---------------+---------+-----------+----------+-------+ CFVFull           Yes      Yes                           +---+---------------+---------+-----------+----------+-------+  Left Venous Findings: +---------+---------------+---------+-----------+----------+--------------+          CompressibilityPhasicitySpontaneityPropertiesSummary        +---------+---------------+---------+-----------+----------+--------------+ CFV      Full           Yes      Yes                                 +---------+---------------+---------+-----------+----------+--------------+ SFJ      Full                                                        +---------+---------------+---------+-----------+----------+--------------+ FV Prox  Full                                                        +---------+---------------+---------+-----------+----------+--------------+ FV Mid   Full                                                        +---------+---------------+---------+-----------+----------+--------------+ FV DistalFull                                                        +---------+---------------+---------+-----------+----------+--------------+ PFV      Full                                                        +---------+---------------+---------+-----------+----------+--------------+ POP      Full           Yes      Yes                                 +---------+---------------+---------+-----------+----------+--------------+  PTV      Full                                                        +---------+---------------+---------+-----------+----------+--------------+ PERO                                                  Not visualized +---------+---------------+---------+-----------+----------+--------------+    Summary: Right: No evidence of common femoral vein obstruction. Left: There is no evidence of deep vein thrombosis in the lower extremity. No cystic structure found in the popliteal fossa.  *See table(s) above for measurements and  observations. Electronically signed by Waverly Ferrari MD on 10/09/2018 at 4:27:36 PM.    Final     Lab Data:  CBC: Recent Labs  Lab 10/28/18 2130 10/29/18 0205  WBC 8.4 6.9  NEUTROABS 5.9  --   HGB 10.9* 10.4*  HCT 33.9* 30.3*  MCV 96.6 94.1  PLT 125* 105*   Basic Metabolic Panel: Recent Labs  Lab 10/28/18 2130  NA 135  K 3.5  CL 106  CO2 22  GLUCOSE 148*  BUN 6  CREATININE 0.90  CALCIUM 9.0   GFR: Estimated Creatinine Clearance: 125.5 mL/min (by C-G formula based on SCr of 0.9 mg/dL). Liver Function Tests: Recent Labs  Lab 10/28/18 2130 10/29/18 0019  AST 23 20  ALT 12 12  ALKPHOS 128* 123  BILITOT 2.1* 1.9*  PROT 7.2 6.9  ALBUMIN 3.5 3.4*   No results for input(s): LIPASE, AMYLASE in the last 168 hours. No results for input(s): AMMONIA in the last 168 hours. Coagulation Profile: No results for input(s): INR, PROTIME in the last 168 hours. Cardiac Enzymes: No results for input(s): CKTOTAL, CKMB, CKMBINDEX, TROPONINI in the last 168 hours. BNP (last 3 results) No results for input(s): PROBNP in the last 8760 hours. HbA1C: No results for input(s): HGBA1C in the last 72 hours. CBG: Recent Labs  Lab 10/28/18 2046 10/29/18 0034 10/29/18 0815  GLUCAP 165* 180* 129*   Lipid Profile: No results for input(s): CHOL, HDL, LDLCALC, TRIG, CHOLHDL, LDLDIRECT in the last 72 hours. Thyroid Function Tests: No results for input(s): TSH, T4TOTAL, FREET4, T3FREE, THYROIDAB in the last 72 hours. Anemia Panel: No results for input(s): VITAMINB12, FOLATE, FERRITIN, TIBC, IRON, RETICCTPCT in the last 72 hours. Urine analysis:    Component Value Date/Time   COLORURINE AMBER (A) 10/05/2018 1153   APPEARANCEUR CLEAR 10/05/2018 1153   LABSPEC 1.034 (H) 10/05/2018 1153   PHURINE 5.0 10/05/2018 1153   GLUCOSEU NEGATIVE 10/05/2018 1153   HGBUR NEGATIVE 10/05/2018 1153   BILIRUBINUR SMALL (A) 10/05/2018 1153   KETONESUR 5 (A) 10/05/2018 1153   PROTEINUR 30 (A)  10/05/2018 1153   NITRITE NEGATIVE 10/05/2018 1153   LEUKOCYTESUR NEGATIVE 10/05/2018 1153     Williamson Cavanah M.D. [facility] 10/29/2018, 9:21 AM  Pager: [phone] Between 7am to 7pm - call Pager - [phone]  After 7pm go to [email or web address] - password [email or web address]  Call night coverage person covering after 7pm

## 2018-10-29 NOTE — Progress Notes (Signed)
Pharmacy Antibiotic Note  Cory Palmer is a 58 y.o. male admitted on 10/28/2018 with wound infection.  Pharmacy has been consulted for Vancomycin dosing.  Patient presented s/p recent transmetatarsal amputation of L-foot on 09/28/18 with draining ulcer at site to start IV antibiotics per Dr. [name]. No surgery planned at this time.   Patient's SCr is slightly up from prior at 0.90 with normalized CrCl ~91 mL/min. WBC is within normal limits and patient is afebrile. Blood cultures are pending. Patient completed Keflex outpatient on 10/19/18.   Plan: Vancomycin 1250 mg IV every 12 hours.  Goal AUC 400-550. Expected AUC: 444 SCr used: 0.9  Height: 6\' 4"  (193 cm) Weight: 260 lb (117.9 kg) IBW/kg (Calculated) : 86.8  Temp (24hrs), Avg:98.4 F (36.9 C), Min:98.2 F (36.8 C), Max:98.7 F (37.1 C)  Recent Labs  Lab 10/28/18 2130 10/29/18 0205  WBC 8.4 6.9  CREATININE 0.90  --   LATICACIDVEN 1.0  --     Estimated Creatinine Clearance: 125.5 mL/min (by C-G formula based on SCr of 0.9 mg/dL).    No Known Allergies  Antimicrobials this admission: Vancomycin 3/30 >>  Dose adjustments this admission:   Microbiology results: 3/30 BCx:  Thank you for allowing pharmacy to be a part of this patient's care.  Fayne Norrie 10/29/2018 10:04 AM

## 2018-10-29 NOTE — Consult Note (Signed)
WOC Nurse wound consult note Patient receiving care in [facility] [address]. No visitors present. Reason for Consult: Left lower extremity wound Wound type: surgical Measurement: 1 cm x 1.5 cm Wound bed: yellow Drainage (amount, consistency, odor) heavy serosanginous Periwound: intact Dressing procedure/placement/frequency: Cleanse the wound on the left foot amputation site with saline. Pat dry. Place a small piece of Aqua[employer]el [employer]+ [place] [phone]) over the area. Secure with dry gauze. Change each shift. Monitor the wound area(s) for worsening of condition such as: Signs/symptoms of infection,  Increase in size,  Development of or worsening of odor, Development of pain, or increased pain at the affected locations.  Notify the medical team if any of these develop.  Thank you for the consult.  Discussed plan of care with the patient and bedside nurse.  WOC nurse will not follow at this time.  Please re-consult the WOC team if needed.  Helmut Muster, RN, MSN, CWOCN, CNS-BC, pager [phone]

## 2018-10-29 NOTE — Progress Notes (Addendum)
Pt cough continues but non-productive. States throat remains sore. Denies any other symptoms. Did initiate standing orders for claritin and chloraseptic spray. Discouraged eating crunchy/hard foods. Encouraged fluids and soft foods. Assisted pt with salt water rinse. Pt in agreement with plan.

## 2018-10-29 NOTE — Progress Notes (Signed)
Observed continuous rocking of BLE. Pt states he has done this for "year" and "doesn't even notice it". Asked pt if this is involuntary and he denies. Was able to stop rocking on command. Denies use of illicit drugs in over a year or drinking of alcohol for several months.

## 2018-10-30 ENCOUNTER — Inpatient Hospital Stay (HOSPITAL_COMMUNITY): Payer: Medicare Other

## 2018-10-30 DIAGNOSIS — E119 Type 2 diabetes mellitus without complications: Secondary | ICD-10-CM

## 2018-10-30 LAB — CBC
HCT: 27.8 % — ABNORMAL LOW (ref 39.0–52.0)
Hemoglobin: 9.4 g/dL — ABNORMAL LOW (ref 13.0–17.0)
MCH: 32.3 pg (ref 26.0–34.0)
MCHC: 33.8 g/dL (ref 30.0–36.0)
MCV: 95.5 fL (ref 80.0–100.0)
Platelets: 82 10*3/uL — ABNORMAL LOW (ref 150–400)
RBC: 2.91 MIL/uL — ABNORMAL LOW (ref 4.22–5.81)
RDW: 13.9 % (ref 11.5–15.5)
WBC: 4.6 10*3/uL (ref 4.0–10.5)
nRBC: 0 % (ref 0.0–0.2)

## 2018-10-30 LAB — COMPREHENSIVE METABOLIC PANEL
ALT: 11 U/L (ref 0–44)
AST: 18 U/L (ref 15–41)
Albumin: 2.9 g/dL — ABNORMAL LOW (ref 3.5–5.0)
Alkaline Phosphatase: 102 U/L (ref 38–126)
Anion gap: 4 — ABNORMAL LOW (ref 5–15)
BUN: 8 mg/dL (ref 6–20)
CHLORIDE: 110 mmol/L (ref 98–111)
CO2: 25 mmol/L (ref 22–32)
CREATININE: 0.73 mg/dL (ref 0.61–1.24)
Calcium: 8.5 mg/dL — ABNORMAL LOW (ref 8.9–10.3)
Glucose, Bld: 136 mg/dL — ABNORMAL HIGH (ref 70–99)
Potassium: 3.5 mmol/L (ref 3.5–5.1)
Sodium: 139 mmol/L (ref 135–145)
Total Bilirubin: 1.3 mg/dL — ABNORMAL HIGH (ref 0.3–1.2)
Total Protein: 5.8 g/dL — ABNORMAL LOW (ref 6.5–8.1)

## 2018-10-30 LAB — GLUCOSE, CAPILLARY
GLUCOSE-CAPILLARY: 91 mg/dL (ref 70–99)
Glucose-Capillary: 89 mg/dL (ref 70–99)
Glucose-Capillary: 111 mg/dL — ABNORMAL HIGH (ref 70–99)
Glucose-Capillary: 107 mg/dL — ABNORMAL HIGH (ref 70–99)

## 2018-10-30 MED ORDER — SODIUM CHLORIDE 0.9 % IV BOLUS
500.0000 mL | Freq: Once | INTRAVENOUS | Status: AC
  Administered 2018-10-30: 500 mL via INTRAVENOUS

## 2018-10-30 MED ORDER — TRAZODONE HCL 50 MG PO TABS
50.0000 mg | ORAL_TABLET | Freq: Every evening | ORAL | Status: DC | PRN
  Administered 2018-10-30 – 2018-11-01 (×3): 50 mg via ORAL
  Filled 2018-10-30 (×3): qty 1

## 2018-10-30 MED ORDER — BENZONATATE 100 MG PO CAPS
100.0000 mg | ORAL_CAPSULE | Freq: Three times a day (TID) | ORAL | Status: DC
  Administered 2018-10-30 – 2018-11-02 (×10): 100 mg via ORAL
  Filled 2018-10-30 (×10): qty 1

## 2018-10-30 MED ORDER — GUAIFENESIN-DM 100-10 MG/5ML PO SYRP
5.0000 mL | ORAL_SOLUTION | ORAL | Status: DC | PRN
  Administered 2018-10-31 (×2): 5 mL via ORAL
  Filled 2018-10-30 (×3): qty 5

## 2018-10-30 MED ORDER — SODIUM CHLORIDE 0.9 % IV SOLN
INTRAVENOUS | Status: AC
  Administered 2018-10-30 (×2): via INTRAVENOUS

## 2018-10-30 MED ORDER — SODIUM CHLORIDE 0.9 % IV SOLN
INTRAVENOUS | Status: DC
  Administered 2018-10-31: 10:00:00 via INTRAVENOUS

## 2018-10-30 MED ORDER — QUETIAPINE FUMARATE 400 MG PO TABS
200.0000 mg | ORAL_TABLET | Freq: Every day | ORAL | Status: DC
  Administered 2018-10-30 – 2018-11-01 (×3): 200 mg via ORAL
  Filled 2018-10-30 (×3): qty 1

## 2018-10-30 NOTE — Plan of Care (Signed)
  Problem: Safety: Goal: Ability to remain free from injury will improve Outcome: Progressing   

## 2018-10-30 NOTE — Progress Notes (Signed)
Pt refused to have wound dressing change tonight; it's been clean,dry and intact.

## 2018-10-30 NOTE — Progress Notes (Signed)
[facility]                                                                              Patient Demographics  Cory Palmer, is a 58 y.o. male, DOB - [birth year], TMH:[id number]  Admit date - 10/28/2018   Admitting Physician John Giovanni, MD  Outpatient Primary MD for the patient is Patient, [name] Per  Outpatient specialists:   LOS - 2  days   Medical records reviewed and are as summarized below:    Chief Complaint  Patient presents with  . Foot Pain       Brief summary   Cory Palmer is a 58 y.o. male with anxiety, bipolar disorder with depression, type 2 diabetes, hypertension, paroxysmal atrial fibrillation, cocaine use, right-sided BKA, recent transmetatarsal amputation of left foot on 2/28 by Dr. [name] presented to ED for evaluation bleeding from his foot wound surgical site for the past 5 days.  He did not noticed any pus drainage, also having some slight pain in this foot.  States he had not had a chance to see Dr. [name] since his recent hospital discharge due to transportation problems.  He finished his antibiotic course but has not taken any of his diabetes medications since his hospital discharge.  Denied having any fevers, chills, chest pain, cough, shortness of breath, abdominal pain, nausea, or vomiting. Patient recently had left foot transmetatarsal amputation on 2/28.  He was admitted to the hospital 3/5 to 3/19 for sepsis secondary to MSSA/strep bacteremia.  No osteomyelitis.  ID and orthopedics were consulted.  He was treated with IV Ancef during the hospitalization and switched to Keflex. Last day of antibiotics 3/20.  TTE performed 3/6 negative for endocarditis.   Assessment & Plan    Principal Problem:  Osteomyelitis (HCC) of the left foot with recent transmetatarsal amputation of left foot on 2/28 -Afebrile, no leukocytosis, normal lactic acid, no sepsis at this time. -X-ray of the left foot showed bony erosion at the distal aspect of the  remaining 2nd,3rd,4th and 5th metatarsals concerning for osteomyelitis -Orthopedics was consulted, seen by Dr. [name]. Recommended IV antibiotics, nonweightbearing on the left, hold off on surgery at this time -Continue IV vancomycin. Patient had MSSA/strep bacteremia during previous admission, was treated with IV Ancef and then switched to Keflex, completed antibiotics on 3/20 -Blood cultures so far negative, if no surgery planned, will possibly DC on oral doxycycline when cleared by Dr. [name]   Active problems Acute metabolic encephalopathy -At the time of my encounter, patient somnolent.  Per RN, he was agitated last night and demanded for Seroquel 400 mg at bedtime and trazodone 100 mg (received at 2 AM).  Patient stated that he has been taking it for years, however now reports to me that he is homeless, I doubt his compliance. -Decrease Seroquel to 200 mg at bedtime and changed trazodone to 50 mg as needed, discussed with RN in detail.  Mildly elevated total bilirubin -Right upper quadrant ultrasound pending, LFTs normalized, bilirubin decreasing from 2.1 on 3/29 to 1.3 today  Diabetes mellitus type 2 - A1c 6.6 on 2/28, continue sliding scale  -CBGs stable  Paroxysmal Afib  -Currently normal sinus rhythm, per DC summary, was seen by cardiology during the previous hospitalization.  EF 60-65%.  Not a candidate for long-term anticoagulation due to anemia/thrombocytopenia.  BPH Continue Flomax  Borderline BP Will give 1 fluid bolus 500 cc and then placed on maintenance normal saline 75 cc an hour for 2 L then KVO  Rhinovirus positive -Flu negative, chest x-ray did not show any infiltrates, RVP positive for rhinovirus -No hypoxia, lungs clear, follow closely, patient is homeless and has been around people, does not know any sick contacts for COVID -Continue droplet precautions  Code Status: Full code DVT Prophylaxis:   SCD's Family Communication: Discussed in detail with the patient,  all imaging results, lab results explained to the patient   Disposition Plan:   Time Spent in minutes 25 minutes  Procedures:  None  Consultants:   Orthopedics  Antimicrobials:   Anti-infectives (From admission, onward)   Start     Dose/Rate Route Frequency Ordered Stop   10/29/18 1030  vancomycin (VANCOCIN) 1,250 mg in sodium chloride 0.9 % 250 mL IVPB     1,250 mg 166.7 mL/hr over 90 Minutes Intravenous Every 12 hours 10/29/18 1002           Medications  Scheduled Meds: . benzonatate  100 mg Oral TID  . guaiFENesin  1,200 mg Oral BID  . insulin aspart  0-9 Units Subcutaneous TID WC  . QUEtiapine  200 mg Oral QHS  . tamsulosin  0.4 mg Oral QPC supper   Continuous Infusions: . sodium chloride 75 mL/hr at 10/30/18 0932  . [START ON 10/31/2018] sodium chloride    . vancomycin 1,250 mg (10/30/18 0931)   PRN Meds:.acetaminophen **OR** acetaminophen, guaiFENesin-dextromethorphan, loratadine, phenol, traZODone      Subjective:   Cory Palmer was seen and examined today.  Snoring, somnolent, no fevers or chills.  Difficult to obtain review of system from the patient.  Overnight was agitated and demanded seroquel and trazodone.    Objective:   Vitals:   10/29/18 1924 10/30/18 0359 10/30/18 0824 10/30/18 1017  BP: (!) 104/58 (!) 101/58 (!) 93/43 (!) 95/59  Pulse: (!) 48 (!) 49 (!) 56 (!) 47  Resp: Temp: 98.7 F (37.1 C) 98.3 F (36.8 C) 99 F (37.2 C) 98.5 F (36.9 C)  TempSrc: Oral Oral Axillary Oral  SpO2: 100% 97% 96% 99%  Weight:      Height:        Intake/Output Summary (Last 24 hours) at 10/30/2018 1036 Last data filed at 10/30/2018 0900 Gross per 24 hour  Intake 544.61 ml  Output 2200 ml  Net -1655.39 ml     Wt Readings from Last 3 Encounters:  10/28/18 117.9 kg  10/09/18 104.5 kg  10/01/18 113.4 kg   . Physical Exam  General: somnolent but arousable    Eyes:   HEENT:  Atraumatic  Cardiovascular: S1 S2 clear, RRR. No  pedal edema b/l  Respiratory: CTAB, no wheezing, rales or rhonchi  Gastrointestinal: Soft, nontender, nondistended, NBS  Ext: no pedal edema bilaterally  Neuro: no new deficits  Musculoskeletal: No cyanosis, clubbing  Skin: left forefoot wound  Psych: somnolent      Data Reviewed:  I have personally reviewed following labs and imaging studies  Micro Results Recent Results (from the past 240 hour(s))  Culture, blood (routine x 2)     Status: None (Preliminary result)   Collection Time: 10/29/18 12:01 AM  Result Value Ref  Range Status   Specimen Description BLOOD RIGHT ARM  Final   Special Requests   Final    BOTTLES DRAWN AEROBIC AND ANAEROBIC Blood Culture results may not be optimal due to an excessive volume of blood received in culture bottles   Culture   Final    NO GROWTH 1 DAY Performed at [facility], [address], [place], [place] [address]    Report Status PENDING  Incomplete  Culture, blood (routine x 2)     Status: None (Preliminary result)   Collection Time: 10/29/18 12:01 AM  Result Value Ref Range Status   Specimen Description BLOOD RIGHT ARM  Final   Special Requests   Final    BOTTLES DRAWN AEROBIC ONLY Blood Culture adequate volume   Culture   Final    NO GROWTH 1 DAY Performed at [facility], [address], [place], [place] [address]    Report Status PENDING  Incomplete  Respiratory Panel by PCR     Status: Abnormal   Collection Time: 10/29/18 12:19 PM  Result Value Ref Range Status   Adenovirus NOT DETECTED NOT DETECTED Final   Coronavirus 229E NOT DETECTED NOT DETECTED Final    Comment: (NOTE) The Coronavirus on the Respiratory Panel, DOES NOT test for the novel  Coronavirus (2019 nCoV)    Coronavirus HKU1 NOT DETECTED NOT DETECTED Final   Coronavirus NL63 NOT DETECTED NOT DETECTED Final   Coronavirus OC43 NOT DETECTED NOT DETECTED Final   Metapneumovirus NOT DETECTED NOT DETECTED Final   Rhinovirus / Enterovirus DETECTED  (A) NOT DETECTED Final   Influenza A NOT DETECTED NOT DETECTED Final   Influenza B NOT DETECTED NOT DETECTED Final   Parainfluenza Virus 1 NOT DETECTED NOT DETECTED Final   Parainfluenza Virus 2 NOT DETECTED NOT DETECTED Final   Parainfluenza Virus 3 NOT DETECTED NOT DETECTED Final   Parainfluenza Virus 4 NOT DETECTED NOT DETECTED Final   Respiratory Syncytial Virus NOT DETECTED NOT DETECTED Final   Bordetella pertussis NOT DETECTED NOT DETECTED Final   Chlamydophila pneumoniae NOT DETECTED NOT DETECTED Final   Mycoplasma pneumoniae NOT DETECTED NOT DETECTED Final    Comment: Performed at [facility], [address] [place], [place] [address]    Radiology Reports Dg Chest 2 View  Result Date: 10/29/2018 CLINICAL DATA:  Dry cough for few days, sore throat, diaphoresis EXAM: CHEST - 2 VIEW COMPARISON:  10/04/2018 FINDINGS: Normal heart size, mediastinal contours, and pulmonary vascularity. Lungs clear. No pulmonary infiltrate, pleural effusion, or pneumothorax. Bones demineralized but otherwise unremarkable. IMPRESSION: No acute abnormalities. Electronically Signed   By: [name] M.D.   On: 10/29/2018 13:17   Dg Chest 2 View  Result Date: 10/04/2018 CLINICAL DATA:  Cough for 4 days EXAM: CHEST - 2 VIEW COMPARISON:  05/08/2018 FINDINGS: Mild cardiomegaly. No overt pulmonary edema. Both lungs are clear. The visualized skeletal structures are unremarkable. IMPRESSION: No active cardiopulmonary disease.  Mild cardiomegaly Electronically Signed   By: Jasmine Pang M.D.   On: 10/04/2018 22:52   Dg Foot Complete Left  Result Date: 10/28/2018 CLINICAL DATA:  Left foot bleeding near surgical amputation site. Pain at surgical site. Rule out osteomyelitis. EXAM: LEFT FOOT - COMPLETE 3+ VIEW COMPARISON:  September 24, 2018 and October 04, 2018 FINDINGS: Three linear foreign bodies identified in the soft tissues, unchanged since September 24, 2018. The amputation sites were sharp on the previous  study. The amputation sites are less sharp today including  the distal aspects of the remaining second, third, fourth, and fifth metatarsals. The distal second metatarsal appears more foreshortened today. IMPRESSION: 1. Bony erosion at the distal aspect of the remaining second, third, fourth, and fifth metatarsals concerning for osteomyelitis. 2. Stable foreign bodies in the soft tissues. Electronically Signed   By: Gerome Sam III M.D   On: 10/28/2018 21:30   Dg Foot Complete Left  Result Date: 10/05/2018 CLINICAL DATA:  Recent foot surgery possible wound infection. EXAM: LEFT FOOT - COMPLETE 3+ VIEW COMPARISON:  Preoperative radiograph 09/24/2018 FINDINGS: Post transmetatarsal amputation of foot. The resection margins are sharp. No periosteal reaction or bony destructive change. Three linear foreign bodies projecting over the foot are unchanged prior exam, likely in the soft tissues, less likely external to the patient given persistence on pre and postoperative imaging. No tracking soft tissue air. Overlying dressing in place. IMPRESSION: Post transmetatarsal amputation of the foot. No radiographic findings of osteomyelitis. Electronically Signed   By: Narda Rutherford M.D.   On: 10/05/2018 00:04   Vas [place] Lower Extremity Venous (dvt)  Result Date: 10/09/2018  Lower Venous Study Indications: Edema.  Performing Technologist: Blanch Media RVS  Examination Guidelines: A complete evaluation includes B-mode imaging, spectral Doppler, color Doppler, and power Doppler as needed of all accessible portions of each vessel. Bilateral testing is considered an integral part of a complete examination. Limited examinations for reoccurring indications may be performed as noted.  Right Venous Findings: +---+---------------+---------+-----------+----------+-------+    CompressibilityPhasicitySpontaneityPropertiesSummary +---+---------------+---------+-----------+----------+-------+ CFVFull           Yes      Yes                           +---+---------------+---------+-----------+----------+-------+  Left Venous Findings: +---------+---------------+---------+-----------+----------+--------------+          CompressibilityPhasicitySpontaneityPropertiesSummary        +---------+---------------+---------+-----------+----------+--------------+ CFV      Full           Yes      Yes                                 +---------+---------------+---------+-----------+----------+--------------+ SFJ      Full                                                        +---------+---------------+---------+-----------+----------+--------------+ FV Prox  Full                                                        +---------+---------------+---------+-----------+----------+--------------+ FV Mid   Full                                                        +---------+---------------+---------+-----------+----------+--------------+ FV DistalFull                                                        +---------+---------------+---------+-----------+----------+--------------+  PFV      Full                                                        +---------+---------------+---------+-----------+----------+--------------+ POP      Full           Yes      Yes                                 +---------+---------------+---------+-----------+----------+--------------+ PTV      Full                                                        +---------+---------------+---------+-----------+----------+--------------+ PERO                                                  Not visualized +---------+---------------+---------+-----------+----------+--------------+    Summary: Right: No evidence of common femoral vein obstruction. Left: There is no evidence of deep vein thrombosis in the lower extremity. No cystic structure found in the popliteal fossa.  *See table(s) above for measurements and  observations. Electronically signed by Waverly Ferrari MD on 10/09/2018 at 4:27:36 PM.    Final     Lab Data:  CBC: Recent Labs  Lab 10/28/18 2130 10/29/18 0205 10/30/18 0307  WBC 8.4 6.9 4.6  NEUTROABS 5.9  --   --   HGB 10.9* 10.4* 9.4*  HCT 33.9* 30.3* 27.8*  MCV 96.6 94.1 95.5  PLT 125* 105* 82*   Basic Metabolic Panel: Recent Labs  Lab 10/28/18 2130 10/30/18 0307  NA 135 139  K 3.5 3.5  CL 106 110  CO2 22 25  GLUCOSE 148* 136*  BUN 6 8  CREATININE 0.90 0.73  CALCIUM 9.0 8.5*   GFR: Estimated Creatinine Clearance: 141.2 mL/min (by C-G formula based on SCr of 0.73 mg/dL). Liver Function Tests: Recent Labs  Lab 10/28/18 2130 10/29/18 0019 10/30/18 0307  AST ALT ALKPHOS 128* 123 102  BILITOT 2.1* 1.9* 1.3*  PROT 7.2 6.9 5.8*  ALBUMIN 3.5 3.4* 2.9*   No results for input(s): LIPASE, AMYLASE in the last 168 hours. No results for input(s): AMMONIA in the last 168 hours. Coagulation Profile: No results for input(s): INR, PROTIME in the last 168 hours. Cardiac Enzymes: No results for input(s): CKTOTAL, CKMB, CKMBINDEX, TROPONINI in the last 168 hours. BNP (last 3 results) No results for input(s): PROBNP in the last 8760 hours. HbA1C: No results for input(s): HGBA1C in the last 72 hours. CBG: Recent Labs  Lab 10/29/18 0815 10/29/18 1138 10/29/18 1625 10/29/18 2127 10/30/18 0756  GLUCAP 129* 165* 129* 119* 89   Lipid Profile: No results for input(s): CHOL, HDL, LDLCALC, TRIG, CHOLHDL, LDLDIRECT in the last 72 hours. Thyroid Function Tests: No results for input(s): TSH, T4TOTAL, FREET4, T3FREE, THYROIDAB in the last 72 hours. Anemia Panel: No results for input(s): VITAMINB12, FOLATE, FERRITIN, TIBC, IRON, RETICCTPCT in the last 72  hours. Urine analysis:    Component Value Date/Time   COLORURINE AMBER (A) 10/05/2018 1153   APPEARANCEUR CLEAR 10/05/2018 1153   LABSPEC 1.034 (H) 10/05/2018 1153   PHURINE 5.0 10/05/2018 1153    GLUCOSEU NEGATIVE 10/05/2018 1153   HGBUR NEGATIVE 10/05/2018 1153   BILIRUBINUR SMALL (A) 10/05/2018 1153   KETONESUR 5 (A) 10/05/2018 1153   PROTEINUR 30 (A) 10/05/2018 1153   NITRITE NEGATIVE 10/05/2018 1153   LEUKOCYTESUR NEGATIVE 10/05/2018 1153       M.D. [facility] 10/30/2018, 10:36 AM  Pager: [phone] Between 7am to 7pm - call Pager - [phone]  After 7pm go to [email or web address] - password [email or web address]  Call night coverage person covering after 7pm

## 2018-10-30 NOTE — Progress Notes (Signed)
   10/30/18 1900  Clinical Encounter Type  Visited With Health care provider  Visit Type Initial  Referral From Nurse   Spoke w/ care RN via telephone, explained that due to visitor and volunteer restrictions, we don't have anyone to serve as witnesses to notarize advance directives.  Pls f/u if pt has any questions.  Also told RN that on 2nd pg of [facility] AD explanation info, the [place] order of decision-making in absence of HCPOA is listed.  [place] resident, [phone]

## 2018-10-30 NOTE — Progress Notes (Signed)
Pt states he wants his brother [name] to make all decisions for him. States his brother wants to "be involved"and "make decisions" for him. Denies having a POA or will. Accepting of chaplain services to discuss these options. Was informed that POA is only in power when patient unable to make decisions for himself.

## 2018-10-30 NOTE — Progress Notes (Signed)
Pt snoring upon entry to room. Awakens to verbal cues but unable to remain alert and attentive. Does c/o sore throat and coughing. Observed small amount of thin white sputum in cup bedside bed. MD in and assessed pt and educated on plan of care. Pt in agreement. Assessment and VS noted. Bolus NS started. Will continue to monitor.

## 2018-10-31 LAB — GLUCOSE, CAPILLARY
Glucose-Capillary: 145 mg/dL — ABNORMAL HIGH (ref 70–99)
Glucose-Capillary: 178 mg/dL — ABNORMAL HIGH (ref 70–99)
Glucose-Capillary: 72 mg/dL (ref 70–99)
Glucose-Capillary: 132 mg/dL — ABNORMAL HIGH (ref 70–99)

## 2018-10-31 NOTE — Progress Notes (Signed)
[facility]                                                                              Patient Demographics  Cory Palmer, is a 58 y.o. male, DOB - 09-12-1960, AGT:[id number]  Admit date - 10/28/2018   Admitting Physician John Giovanni, MD  Outpatient Primary MD for the patient is Patient, [name] Per  Outpatient specialists:   LOS - 3  days   Medical records reviewed and are as summarized below:    Chief Complaint  Patient presents with  . Foot Pain       Brief summary   RUVEN Palmer is a 58 y.o. male with anxiety, bipolar disorder with depression, type 2 diabetes, hypertension, paroxysmal atrial fibrillation, cocaine use, right-sided BKA, recent transmetatarsal amputation of left foot on 2/28 by Dr. [name] presented to ED for evaluation bleeding from his foot wound surgical site for the past 5 days.  He did not noticed any pus drainage, also having some slight pain in this foot.  States he had not had a chance to see Dr. [name] since his recent hospital discharge due to transportation problems.  He finished his antibiotic course but has not taken any of his diabetes medications since his hospital discharge.  Denied having any fevers, chills, chest pain, cough, shortness of breath, abdominal pain, nausea, or vomiting. Patient recently had left foot transmetatarsal amputation on 2/28.  He was admitted to the hospital 3/5 to 3/19 for sepsis secondary to MSSA/strep bacteremia.  No osteomyelitis.  ID and orthopedics were consulted.  He was treated with IV Ancef during the hospitalization and switched to Keflex. Last day of antibiotics 3/20.  TTE performed 3/6 negative for endocarditis.   Assessment & Plan    Principal Problem:  Osteomyelitis (HCC) of the left foot with recent transmetatarsal amputation of left foot on 2/28 -Afebrile, no leukocytosis, normal lactic acid, no sepsis at this time. -X-ray of the left foot showed bony erosion at the distal aspect of the  remaining 2nd,3rd,4th and 5th metatarsals concerning for osteomyelitis -Orthopedics was consulted, seen by Dr. [name]. Recommended IV antibiotics, nonweightbearing on the left, hold off on surgery at this time -Continue IV vancomycin. Patient had MSSA/strep bacteremia during previous admission, was treated with IV Ancef and then switched to Keflex, completed antibiotics on 3/20 -Blood cultures so far negative, if no surgery planned, plan will be for DC on oral doxycycline when cleared by Dr. [name]   Active problems Acute metabolic encephalopathy -more awake, continue Seroquel and trazodone which patient apparently has been taking for years at  200 mg at bedtime and   trazodone at 50 mg as needed    Mildly elevated total bilirubin -Right upper quadrant ultrasound pending, LFTs normalized, bilirubin decreasing from 2.1 on 3/29 to 1.3 today  Diabetes mellitus type 2 - A1c 6.6 on 2/28, continue sliding scale  -CBGs stable  Paroxysmal Afib  -Currently normal sinus rhythm, per DC summary, was seen by cardiology during the previous hospitalization.  EF 60-65%.  Not a candidate for long-term anticoagulation due to anemia/thrombocytopenia  BPH Continue Flomax    Rhinovirus positive -Flu negative,  chest x-ray did not show any infiltrates, RVP positive for rhinovirus -No hypoxia, lungs clear, follow closely, patient is homeless and has been around people, does not know any sick contacts for COVID -Continue droplet precautions  Code Status: Full code DVT Prophylaxis:   SCD's Family Communication: Discussed in detail with the patient,    Disposition Plan:  SNF--  Procedures:  None  Consultants:   Orthopedics  Antimicrobials:   Anti-infectives (From admission, onward)   Start     Dose/Rate Route Frequency Ordered Stop   10/29/18 1030  vancomycin (VANCOCIN) 1,250 mg in sodium chloride 0.9 % 250 mL IVPB     1,250 mg 166.7 mL/hr over 90 Minutes Intravenous Every 12 hours 10/29/18 1002            Medications  Scheduled Meds: . benzonatate  100 mg Oral TID  . guaiFENesin  1,200 mg Oral BID  . insulin aspart  0-9 Units Subcutaneous TID WC  . QUEtiapine  200 mg Oral QHS  . tamsulosin  0.4 mg Oral QPC supper   Continuous Infusions: . sodium chloride 10 mL/hr at 10/31/18 0952  . vancomycin 1,250 mg (10/31/18 0956)   PRN Meds:.acetaminophen **OR** acetaminophen, guaiFENesin-dextromethorphan, loratadine, phenol, traZODone   Subjective:   [name] was seen and examined today.   No new concerns, patient with mood swings and agitation from time to time  Objective:   Vitals:   10/30/18 1717 10/30/18 2131 10/31/18 0520 10/31/18 1507  BP: 106/77 (!) 142/67 (!) 113/59 (!) 128/94  Pulse: (!) 57 61 (!) 52 (!) 54  Resp: 20 14 18 16   Temp: 98.8 F (37.1 C) 97.8 F (36.6 C) 98.1 F (36.7 C) 98.2 F (36.8 C)  TempSrc: Axillary Oral Oral Oral  SpO2: 99% 100% 95% 100%  Weight:      Height:        Intake/Output Summary (Last 24 hours) at 10/31/2018 1622 Last data filed at 10/31/2018 1500 Gross per 24 hour  Intake 1080 ml  Output 2750 ml  Net -1670 ml     Wt Readings from Last 3 Encounters:  10/28/18 117.9 kg  10/09/18 104.5 kg  10/01/18 113.4 kg     Physical Exam  Patient is examined daily including today on 10/31/18 , exams remain the same as of yesterday except that has changed   Gen:- Awake Alert,  In no apparent distress  HEENT:- [place].AT, No sclera icterus Neck-Supple Neck,No JVD,.  Lungs-  CTAB , fair air movement CV- S1, S2 normal, irregular Abd-  +ve B.Sounds, Abd Soft, No tenderness,    Extremity/Skin:-Warm and dry, good pedal pulses Psych-affect is appropriate, oriented x3 Neuro-no new focal deficits, no tremors MSK--right old BKA, left foot transmetatarsal amputation with dressing on    Data Reviewed:  I have personally reviewed following labs and imaging studies  Micro Results Recent Results (from the past 240 hour(s))  Culture, blood  (routine x 2)     Status: None (Preliminary result)   Collection Time: 10/29/18 12:01 AM  Result Value Ref Range Status   Specimen Description BLOOD RIGHT ARM  Final   Special Requests   Final    BOTTLES DRAWN AEROBIC AND ANAEROBIC Blood Culture results may not be optimal due to an excessive volume of blood received in culture bottles   Culture   Final    NO GROWTH 2 DAYS Performed at [facility], [address], [place], [place] [address]    Report Status PENDING  Incomplete  Culture, blood (routine x 2)     Status: None (Preliminary result)   Collection Time: 10/29/18 12:01 AM  Result Value Ref Range Status   Specimen Description BLOOD RIGHT ARM  Final   Special Requests   Final    BOTTLES DRAWN AEROBIC ONLY Blood Culture adequate volume   Culture   Final    NO GROWTH 2 DAYS Performed at [facility], [address], [place], [place] [address]    Report Status PENDING  Incomplete  Respiratory Panel by PCR     Status: Abnormal   Collection Time: 10/29/18 12:19 PM  Result Value Ref Range Status   Adenovirus NOT DETECTED NOT DETECTED Final   Coronavirus 229E NOT DETECTED NOT DETECTED Final    Comment: (NOTE) The Coronavirus on the Respiratory Panel, DOES NOT test for the novel  Coronavirus (2019 nCoV)    Coronavirus HKU1 NOT DETECTED NOT DETECTED Final   Coronavirus NL63 NOT DETECTED NOT DETECTED Final   Coronavirus OC43 NOT DETECTED NOT DETECTED Final   Metapneumovirus NOT DETECTED NOT DETECTED Final   Rhinovirus / Enterovirus DETECTED (A) NOT DETECTED Final   Influenza A NOT DETECTED NOT DETECTED Final   Influenza B NOT DETECTED NOT DETECTED Final   Parainfluenza Virus 1 NOT DETECTED NOT DETECTED Final   Parainfluenza Virus 2 NOT DETECTED NOT DETECTED Final   Parainfluenza Virus 3 NOT DETECTED NOT DETECTED Final   Parainfluenza Virus 4 NOT DETECTED NOT DETECTED Final   Respiratory Syncytial Virus NOT DETECTED NOT DETECTED Final   Bordetella pertussis NOT  DETECTED NOT DETECTED Final   Chlamydophila pneumoniae NOT DETECTED NOT DETECTED Final   Mycoplasma pneumoniae NOT DETECTED NOT DETECTED Final    Comment: Performed at [facility], [address] [place], [place] [address]    Radiology Reports Dg Chest 2 View  Result Date: 10/29/2018 CLINICAL DATA:  Dry cough for few days, sore throat, diaphoresis EXAM: CHEST - 2 VIEW COMPARISON:  10/04/2018 FINDINGS: Normal heart size, mediastinal contours, and pulmonary vascularity. Lungs clear. No pulmonary infiltrate, pleural effusion, or pneumothorax. Bones demineralized but otherwise unremarkable. IMPRESSION: No acute abnormalities. Electronically Signed   By: [name] M.D.   On: 10/29/2018 13:17   Dg Chest 2 View  Result Date: 10/04/2018 CLINICAL DATA:  Cough for 4 days EXAM: CHEST - 2 VIEW COMPARISON:  05/08/2018 FINDINGS: Mild cardiomegaly. No overt pulmonary edema. Both lungs are clear. The visualized skeletal structures are unremarkable. IMPRESSION: No active cardiopulmonary disease.  Mild cardiomegaly Electronically Signed   By: Jasmine Pang M.D.   On: 10/04/2018 22:52   Dg Foot Complete Left  Result Date: 10/28/2018 CLINICAL DATA:  Left foot bleeding near surgical amputation site. Pain at surgical site. Rule out osteomyelitis. EXAM: LEFT FOOT - COMPLETE 3+ VIEW COMPARISON:  September 24, 2018 and October 04, 2018 FINDINGS: Three linear foreign bodies identified in the soft tissues, unchanged since September 24, 2018. The amputation sites were sharp on the previous study. The amputation sites are less sharp today including the distal aspects of the remaining second, third, fourth, and fifth metatarsals. The distal second metatarsal appears more foreshortened today. IMPRESSION: 1. Bony erosion at the distal aspect of the remaining second, third, fourth, and fifth metatarsals concerning for osteomyelitis. 2. Stable foreign bodies in the soft tissues. Electronically Signed   By: Gerome Sam III  M.D   On: 10/28/2018 21:30   Dg Foot Complete Left  Result Date: 10/05/2018 CLINICAL DATA:  Recent foot surgery  possible wound infection. EXAM: LEFT FOOT - COMPLETE 3+ VIEW COMPARISON:  Preoperative radiograph 09/24/2018 FINDINGS: Post transmetatarsal amputation of foot. The resection margins are sharp. No periosteal reaction or bony destructive change. Three linear foreign bodies projecting over the foot are unchanged prior exam, likely in the soft tissues, less likely external to the patient given persistence on pre and postoperative imaging. No tracking soft tissue air. Overlying dressing in place. IMPRESSION: Post transmetatarsal amputation of the foot. No radiographic findings of osteomyelitis. Electronically Signed   By: Narda Rutherford M.D.   On: 10/05/2018 00:04   Vas [place] Lower Extremity Venous (dvt)  Result Date: 10/09/2018  Lower Venous Study Indications: Edema.  Performing Technologist: Blanch Media RVS  Examination Guidelines: A complete evaluation includes B-mode imaging, spectral Doppler, color Doppler, and power Doppler as needed of all accessible portions of each vessel. Bilateral testing is considered an integral part of a complete examination. Limited examinations for reoccurring indications may be performed as noted.  Right Venous Findings: +---+---------------+---------+-----------+----------+-------+    CompressibilityPhasicitySpontaneityPropertiesSummary +---+---------------+---------+-----------+----------+-------+ CFVFull           Yes      Yes                          +---+---------------+---------+-----------+----------+-------+  Left Venous Findings: +---------+---------------+---------+-----------+----------+--------------+          CompressibilityPhasicitySpontaneityPropertiesSummary        +---------+---------------+---------+-----------+----------+--------------+ CFV      Full           Yes      Yes                                  +---------+---------------+---------+-----------+----------+--------------+ SFJ      Full                                                        +---------+---------------+---------+-----------+----------+--------------+ FV Prox  Full                                                        +---------+---------------+---------+-----------+----------+--------------+ FV Mid   Full                                                        +---------+---------------+---------+-----------+----------+--------------+ FV DistalFull                                                        +---------+---------------+---------+-----------+----------+--------------+ PFV      Full                                                        +---------+---------------+---------+-----------+----------+--------------+  POP      Full           Yes      Yes                                 +---------+---------------+---------+-----------+----------+--------------+ PTV      Full                                                        +---------+---------------+---------+-----------+----------+--------------+ PERO                                                  Not visualized +---------+---------------+---------+-----------+----------+--------------+    Summary: Right: No evidence of common femoral vein obstruction. Left: There is no evidence of deep vein thrombosis in the lower extremity. No cystic structure found in the popliteal fossa.  *See table(s) above for measurements and observations. Electronically signed by Waverly Ferrari MD on 10/09/2018 at 4:27:36 PM.    Final    US Abdomen Limited Ruq  Result Date: 10/30/2018 CLINICAL DATA:  Elevated total bilirubin. EXAM: ULTRASOUND ABDOMEN LIMITED RIGHT UPPER QUADRANT COMPARISON:  None. FINDINGS: Gallbladder: No gallstones. Borderline wall thickening measuring 3 mm. No sonographic Murphy sign noted by sonographer. Common bile duct:  Diameter: 4 mm, normal. Liver: No focal lesion identified. Nodular contour. Heterogeneous, coarsened parenchymal echogenicity. Portal vein is patent on color Doppler imaging with normal direction of blood flow towards the liver. Recanalized umbilical vein. IMPRESSION: 1. Cirrhosis with recanalized umbilical vein. 2. Borderline gallbladder wall thickening without cholelithiasis, likely reflecting underlying liver disease. Electronically Signed   By: Obie Dredge M.D.   On: 10/30/2018 11:34    Lab Data:  CBC: Recent Labs  Lab 10/28/18 2130 10/29/18 0205 10/30/18 0307  WBC 8.4 6.9 4.6  NEUTROABS 5.9  --   --   HGB 10.9* 10.4* 9.4*  HCT 33.9* 30.3* 27.8*  MCV 96.6 94.1 95.5  PLT 125* 105* 82*   Basic Metabolic Panel: Recent Labs  Lab 10/28/18 2130 10/30/18 0307  NA 135 139  K 3.5 3.5  CL 106 110  CO2 22 25  GLUCOSE 148* 136*  BUN 6 8  CREATININE 0.90 0.73  CALCIUM 9.0 8.5*   GFR: Estimated Creatinine Clearance: 141.2 mL/min (by C-G formula based on SCr of 0.73 mg/dL). Liver Function Tests: Recent Labs  Lab 10/28/18 2130 10/29/18 0019 10/30/18 0307  AST 23 20 18   ALT 12 12 11   ALKPHOS 128* 123 102  BILITOT 2.1* 1.9* 1.3*  PROT 7.2 6.9 5.8*  ALBUMIN 3.5 3.4* 2.9*   No results for input(s): LIPASE, AMYLASE in the last 168 hours. No results for input(s): AMMONIA in the last 168 hours. Coagulation Profile: No results for input(s): INR, PROTIME in the last 168 hours. Cardiac Enzymes: No results for input(s): CKTOTAL, CKMB, CKMBINDEX, TROPONINI in the last 168 hours. BNP (last 3 results) No results for input(s): PROBNP in the last 8760 hours. HbA1C: No results for input(s): HGBA1C in the last 72 hours. CBG: Recent Labs  Lab 10/30/18 1113 10/30/18 1655 10/30/18 2232 10/31/18 0737 10/31/18 1133  GLUCAP 91 111* 107* 145* 178*  Lipid Profile: No results for input(s): CHOL, HDL, LDLCALC, TRIG, CHOLHDL, LDLDIRECT in the last 72 hours. Thyroid Function Tests:  No results for input(s): TSH, T4TOTAL, FREET4, T3FREE, THYROIDAB in the last 72 hours. Anemia Panel: No results for input(s): VITAMINB12, FOLATE, FERRITIN, TIBC, IRON, RETICCTPCT in the last 72 hours. Urine analysis:    Component Value Date/Time   COLORURINE AMBER (A) 10/05/2018 1153   APPEARANCEUR CLEAR 10/05/2018 1153   LABSPEC 1.034 (H) 10/05/2018 1153   PHURINE 5.0 10/05/2018 1153   GLUCOSEU NEGATIVE 10/05/2018 1153   HGBUR NEGATIVE 10/05/2018 1153   BILIRUBINUR SMALL (A) 10/05/2018 1153   KETONESUR 5 (A) 10/05/2018 1153   PROTEINUR 30 (A) 10/05/2018 1153   NITRITE NEGATIVE 10/05/2018 1153   LEUKOCYTESUR NEGATIVE 10/05/2018 1153     Coy Rochford M.D. [facility] 10/31/2018, 4:22 PM     After 7pm go to [email or web address] - password [email or web address]  Call night coverage person covering after 7pm

## 2018-10-31 NOTE — Evaluation (Signed)
Physical Therapy Evaluation Patient Details Name: Cory Palmer MRN: [id number] DOB: 1961-03-31 Today's Date: 10/31/2018   History of Present Illness  Pt is a 59 y/o male admitted secondary to worsening L foot pain. Imaging suspicious for osteomyelitis. Pt also positive for rhinovirus. PMH includes L transmet amputation, R BKA, bipolar disorder, a fib, DM.   Clinical Impression  Pt admitted secondary to problem above with deficits below. Performed sit<>Stand transfer with mod A, however, pt unable to maintain appropriate weightbearing precautions. Performed anterior<>posterior transfer to chair to maintain precautions. Pt with very poor awareness of precautions and why they need to be maintained despite education. Feel pt is at increased risk for falls and will require SNF level therapies at d/c. Will continue to follow acutely to maximize functional mobility independence and safety.     Follow Up Recommendations SNF    Equipment Recommendations  Wheelchair (measurements PT);Wheelchair cushion (measurements PT)    Recommendations for Other Services       Precautions / Restrictions Precautions Precautions: Fall;Other (comment) Precaution Comments: R BKA, droplet precautions  Required Braces or Orthoses: Other Brace Other Brace: R prosthesis in room  Restrictions Weight Bearing Restrictions: Yes LLE Weight Bearing: Touchdown weight bearing      Mobility  Bed Mobility Overal bed mobility: Needs Assistance Bed Mobility: Supine to Sit     Supine to sit: Supervision     General bed mobility comments: Supervision for safety.   Transfers Overall transfer level: Needs assistance Equipment used: Rolling walker (2 wheeled);None Transfers: Sit to/from Stand;Anterior-Posterior Transfer Sit to Stand: Mod assist     Anterior-Posterior transfers: Min guard   General transfer comment: Performed sit<>Stand transfer, however, pt unable to maintain TDWB on LLE despite cues. HAd pt  return to sitting and performed anterior/posterior transfer to chair in order to maintain precautions. Required min guard for steadying during anterior/posterior transfer.   Ambulation/Gait                Stairs            Wheelchair Mobility    Modified Rankin (Stroke Patients Only)       Balance Overall balance assessment: Needs assistance Sitting-balance support: No upper extremity supported;Feet supported Sitting balance-Leahy Scale: Good     Standing balance support: Bilateral upper extremity supported;During functional activity Standing balance-Leahy Scale: Poor Standing balance comment: Reliant on BUE support                              Pertinent Vitals/Pain Pain Assessment: Faces Faces Pain Scale: Hurts even more Pain Location: L foot  Pain Descriptors / Indicators: Aching;Grimacing;Guarding Pain Intervention(s): Limited activity within patient's tolerance;Monitored during session;Repositioned    Home Living Family/patient expects to be discharged to:: Shelter/Homeless                      Prior Function Level of Independence: Independent with assistive device(s)         Comments: Reports using cane for ambulation      Hand Dominance        Extremity/Trunk Assessment   Upper Extremity Assessment Upper Extremity Assessment: Defer to OT evaluation    Lower Extremity Assessment Lower Extremity Assessment: LLE deficits/detail;RLE deficits/detail RLE Deficits / Details: R BKA at baseline  LLE Deficits / Details: L foot pain secondary to infection    Cervical / Trunk Assessment Cervical / Trunk Assessment: Normal  Communication   Communication: No  difficulties  Cognition Arousal/Alertness: Awake/alert Behavior During Therapy: Restless Overall Cognitive Status: No family/caregiver present to determine baseline cognitive functioning                                 General Comments: Pt very upset about  his recent stay at hospital prior to this hospitalization. Reports hospital "kicked him out" sooner than they were supposed to.       General Comments      Exercises     Assessment/Plan    PT Assessment Patient needs continued PT services  PT Problem List Decreased strength;Decreased balance;Decreased mobility;Decreased activity tolerance;Decreased knowledge of use of DME;Decreased knowledge of precautions;Pain       PT Treatment Interventions DME instruction;Gait training;Functional mobility training;Therapeutic activities;Therapeutic exercise;Balance training;Neuromuscular re-education;Patient/family education    PT Goals (Current goals can be found in the Care Plan section)  Acute Rehab PT Goals Patient Stated Goal: "to get my own place" PT Goal Formulation: With patient Time For Goal Achievement: 11/14/18 Potential to Achieve Goals: Fair    Frequency Min 2X/week   Barriers to discharge Decreased caregiver support      Co-evaluation               AM-PAC PT "6 Clicks" Mobility  Outcome Measure Help needed turning from your back to your side while in a flat bed without using bedrails?: None Help needed moving from lying on your back to sitting on the side of a flat bed without using bedrails?: None Help needed moving to and from a bed to a chair (including a wheelchair)?: A Little Help needed standing up from a chair using your arms (e.g., wheelchair or bedside chair)?: A Lot Help needed to walk in hospital room?: Total Help needed climbing 3-5 steps with a railing? : Total 6 Click Score: 15    End of Session Equipment Utilized During Treatment: Gait belt Activity Tolerance: Patient tolerated treatment well Patient left: in chair;with call bell/phone within reach;with chair alarm set Nurse Communication: Mobility status;Weight bearing status PT Visit Diagnosis: Muscle weakness (generalized) (M62.81);Difficulty in walking, not elsewhere classified (R26.2);Other  abnormalities of gait and mobility (R26.89);Pain Pain - Right/Left: Left Pain - part of body: Ankle and joints of foot    Time: 0946-1006 PT Time Calculation (min) (ACUTE ONLY): 20 min   Charges:   PT Evaluation $PT Eval Moderate Complexity: 1 Mod          Gladys Damme, PT, DPT  Acute Rehabilitation Services  Pager: [phone] Office: [phone]   Lehman Prom 10/31/2018, 12:33 PM

## 2018-10-31 NOTE — Plan of Care (Signed)

## 2018-10-31 NOTE — Progress Notes (Signed)
CSW requesting PT/OT orders from MD. Patient has multiple hospital admittances/ED visits due to wound infections and is homeless, patient would benefit from discharge to skilled nursing facility for continued care of wounds and consistent medication management.   [place], [place] [phone]

## 2018-10-31 NOTE — Progress Notes (Signed)
Per patient, Dr. [name] should contact pt Dr in [place] at [email or web address] or call at [phone] for updates prior to D/C.

## 2018-11-01 ENCOUNTER — Telehealth (INDEPENDENT_AMBULATORY_CARE_PROVIDER_SITE_OTHER): Payer: Self-pay

## 2018-11-01 LAB — GLUCOSE, CAPILLARY
Glucose-Capillary: 102 mg/dL — ABNORMAL HIGH (ref 70–99)
Glucose-Capillary: 90 mg/dL (ref 70–99)
Glucose-Capillary: 91 mg/dL (ref 70–99)
Glucose-Capillary: 122 mg/dL — ABNORMAL HIGH (ref 70–99)

## 2018-11-01 NOTE — Progress Notes (Signed)
Orthopedic Tech Progress Note Patient Details:  Cory Palmer Jul 08, 1961 [id number]  Ortho Devices Type of Ortho Device: CAM walker Ortho Device/Splint Location: LLE Ortho Device/Splint Interventions: Adjustment, Application, Ordered   Post Interventions Patient Tolerated: Well Instructions Provided: Care of device, Adjustment of device   [name] 11/01/2018, 12:07 PM

## 2018-11-01 NOTE — Telephone Encounter (Signed)
Patient called stating that they are trying to discharge him and would like for Dr. [name] to call.  Stated that he wouldn't have anywhere to go right now and he doesn't have a boot or postop shoe for his left foot. Stated that he is afraid that his foot will get infected again.  Cb# is [phone].  Please advise.  Thank you.

## 2018-11-01 NOTE — Telephone Encounter (Signed)
Spoke with patient about concerns for discharge with non weight bearing status to left foot. Patient reports he will have appropriate housing in 1 week and is concerned about walking around on the left foot. Dr. Sharolyn Douglas to place in a left foot fracture boot . Will discuss DC plan further with social work.   Patient will need to DC on Doxycycline per Dr. [name] and follow up in the office in 1 week following discharge.

## 2018-11-01 NOTE — Progress Notes (Signed)
CSW consulted with patient regarding dc plan, as last time patient was in the hospital he refused to go to SNF [facility] in [place] and was discharged "home" to shelter.   Patient reports he's once again refusing to go to SNF, CSW explained [facility] is the only option right now as they are able to take patient with his extensive substance abuse history and being homeless. CSW consulted patient regarding where he will go instead of SNF and he reports he does not know. CSW informed patient of [facility] that is housing homeless persons right now, patient states he may be open to going here as he needs a short term place to live before he gets his apartment.   Patient asked CSW to call his brother [name] at [phone] to discuss dc options.   CSW lvm with patient's brother  [name], awaiting call back.   [place], [place] [phone]

## 2018-11-01 NOTE — Telephone Encounter (Signed)
Shawn please advise, thank you 

## 2018-11-01 NOTE — Progress Notes (Signed)
[facility]                                                                              Patient Demographics  Cory Palmer, is a 58 y.o. male, DOB - 14-Jun-1961, PYP:[id number]  Admit date - 10/28/2018   Admitting Physician John Giovanni, MD  Outpatient Primary MD for the patient is Patient, [name] Per  Outpatient specialists:   LOS - 4  days   Medical records reviewed and are as summarized below:    Chief Complaint  Patient presents with  . Foot Pain       Brief summary   Cory Palmer is a 58 y.o. male with anxiety, bipolar disorder with depression, type 2 diabetes, hypertension, paroxysmal atrial fibrillation, cocaine use, right-sided BKA, recent transmetatarsal amputation of left foot on 2/28 by Dr. Lajoyce Palmer presented to ED for evaluation bleeding from his foot wound surgical site for the past 5 days.  He did not noticed any pus drainage, also having some slight pain in this foot.  States he had not had a chance to see Dr. Lajoyce Palmer since his recent hospital discharge due to transportation problems.  He finished his antibiotic course but has not taken any of his diabetes medications since his hospital discharge.  Denied having any fevers, chills, chest pain, cough, shortness of breath, abdominal pain, nausea, or vomiting. Patient recently had left foot transmetatarsal amputation on 2/28.  He was admitted to the hospital 3/5 to 3/19 for sepsis secondary to MSSA/strep bacteremia.  No osteomyelitis.  ID and orthopedics were consulted.  He was treated with IV Ancef during the hospitalization and switched to Keflex. Last day of antibiotics 3/20.  TTE performed 3/6 negative for endocarditis.   Assessment & Plan    Principal Problem:  Osteomyelitis (HCC) of the left foot with recent transmetatarsal amputation of left foot on 09/28/18 -Afebrile, no leukocytosis, normal lactic acid, no sepsis at this time. -X-ray of the left foot showed bony erosion at the distal aspect of  the remaining 2nd,3rd,4th and 5th metatarsals concerning for osteomyelitis -Orthopedics was consulted, seen by Dr. Lajoyce Palmer. Recommended IV antibiotics, nonweightbearing on the left, hold off on surgery at this time -Continue IV vancomycin. Patient had MSSA/strep bacteremia during previous admission, was treated with IV Ancef and then switched to Keflex, completed antibiotics on 3/20 -Blood cultures so far negative, if no surgery planned, plan will be for DC on oral doxycycline when cleared by Dr. Lajoyce Palmer...the patient is to use [name]   Active problems Acute metabolic encephalopathy -more awake, continue Seroquel and trazodone which patient apparently has been taking for years at  200 mg at bedtime and   trazodone at 50 mg as needed    Mildly elevated total bilirubin -Right upper quadrant ultrasound pending, LFTs normalized, bilirubin decreasing from 2.1 on 3/29 to 1.3 today  Diabetes mellitus type 2 - A1c 6.6 on 2/28, continue sliding scale  -CBGs stable  Paroxysmal Afib  -Currently normal sinus rhythm, per DC summary, was seen by cardiology during the previous hospitalization.  EF 60-65%.  Not a candidate for long-term anticoagulation due to anemia/thrombocytopenia  BPH Continue Flomax  Rhinovirus positive -Flu negative, chest x-ray did not show any infiltrates, RVP positive for rhinovirus -No hypoxia, lungs clear, follow closely, patient is homeless and has been around people, does not know any sick contacts for COVID -Continue droplet precautions  Code Status: Full code DVT Prophylaxis:   SCD's Family Communication: Discussed in detail with the patient,  Pt gave permission to call his brother Cory Palmer at [phone] to discuss dc options  Disposition Plan: Patient is again refusing to go to SNF [facility] in [place]-- Again  refuses SNF--Rehab----  [facility] that is housing homeless persons right now, patient states he may be open to going here as he needs  a short term place to live before he gets his apartment  Procedures:  None  Consultants:   Orthopedics  Antimicrobials:   Anti-infectives (From admission, onward)   Start     Dose/Rate Route Frequency Ordered Stop   10/29/18 1030  vancomycin (VANCOCIN) 1,250 mg in sodium chloride 0.9 % 250 mL IVPB     1,250 mg 166.7 mL/hr over 90 Minutes Intravenous Every 12 hours 10/29/18 1002           Medications  Scheduled Meds: . benzonatate  100 mg Oral TID  . guaiFENesin  1,200 mg Oral BID  . insulin aspart  0-9 Units Subcutaneous TID WC  . QUEtiapine  200 mg Oral QHS  . tamsulosin  0.4 mg Oral QPC supper   Continuous Infusions: . sodium chloride Stopped (10/31/18 0959)  . vancomycin 1,250 mg (11/01/18 1021)   PRN Meds:.acetaminophen **OR** acetaminophen, guaiFENesin-dextromethorphan, loratadine, phenol, traZODone   Subjective:   Cory Palmer was seen and examined today.   Resting comfortably, continues to refuse SNF placement  Objective:   Vitals:   10/31/18 0520 10/31/18 1507 10/31/18 2029 11/01/18 0435  BP: (!) 113/59 (!) 128/94 (!) 159/66 111/67  Pulse: (!) 52 (!) 54 62 (!) 58  Resp: Temp: 98.1 F (36.7 C) 98.2 F (36.8 C) 98.1 F (36.7 C) 98.9 F (37.2 C)  TempSrc: Oral Oral Oral Oral  SpO2: 95% 100% 100% 96%  Weight:      Height:        Intake/Output Summary (Last 24 hours) at 11/01/2018 1434 Last data filed at 11/01/2018 1100 Gross per 24 hour  Intake 1940.52 ml  Output 3800 ml  Net -1859.48 ml     Wt Readings from Last 3 Encounters:  10/28/18 117.9 kg  10/09/18 104.5 kg  10/01/18 113.4 kg     Physical Exam  Patient is examined daily including today on 11/01/18 , exams remain the same as of yesterday except that has changed   Gen:- Awake Alert,  In no apparent distress  HEENT:- [place].AT, No sclera icterus Neck-Supple Neck,No JVD,.  Lungs-  CTAB , fair air movement CV- S1, S2 normal, irregular Abd-  +ve B.Sounds, Abd Soft, No  tenderness,    Extremity/Skin:-Warm and dry, good pedal pulses Psych-affect is appropriate, oriented x3 Neuro-no new focal deficits, no tremors MSK--right old BKA, left foot transmetatarsal amputation with dressing on  Micro Results Recent Results (from the past 240 hour(s))  Culture, blood (routine x 2)     Status: None (Preliminary result)   Collection Time: 10/29/18 12:01 AM  Result Value Ref Range Status   Specimen Description BLOOD RIGHT ARM  Final   Special Requests   Final    BOTTLES DRAWN AEROBIC AND ANAEROBIC Blood Culture results may not be optimal due to an excessive volume  of blood received in culture bottles   Culture   Final    NO GROWTH 2 DAYS Performed at [facility], [address] [place], [place] [address]    Report Status PENDING  Incomplete  Culture, blood (routine x 2)     Status: None (Preliminary result)   Collection Time: 10/29/18 12:01 AM  Result Value Ref Range Status   Specimen Description BLOOD RIGHT ARM  Final   Special Requests   Final    BOTTLES DRAWN AEROBIC ONLY Blood Culture adequate volume   Culture   Final    NO GROWTH 2 DAYS Performed at [facility], [address], [place], [place] [address]    Report Status PENDING  Incomplete  Respiratory Panel by PCR     Status: Abnormal   Collection Time: 10/29/18 12:19 PM  Result Value Ref Range Status   Adenovirus NOT DETECTED NOT DETECTED Final   Coronavirus 229E NOT DETECTED NOT DETECTED Final    Comment: (NOTE) The Coronavirus on the Respiratory Panel, DOES NOT test for the novel  Coronavirus (2019 nCoV)    Coronavirus HKU1 NOT DETECTED NOT DETECTED Final   Coronavirus NL63 NOT DETECTED NOT DETECTED Final   Coronavirus OC43 NOT DETECTED NOT DETECTED Final   Metapneumovirus NOT DETECTED NOT DETECTED Final   Rhinovirus / Enterovirus DETECTED (A) NOT DETECTED Final   Influenza A NOT DETECTED NOT DETECTED Final   Influenza B NOT DETECTED NOT DETECTED Final   Parainfluenza Virus 1  NOT DETECTED NOT DETECTED Final   Parainfluenza Virus 2 NOT DETECTED NOT DETECTED Final   Parainfluenza Virus 3 NOT DETECTED NOT DETECTED Final   Parainfluenza Virus 4 NOT DETECTED NOT DETECTED Final   Respiratory Syncytial Virus NOT DETECTED NOT DETECTED Final   Bordetella pertussis NOT DETECTED NOT DETECTED Final   Chlamydophila pneumoniae NOT DETECTED NOT DETECTED Final   Mycoplasma pneumoniae NOT DETECTED NOT DETECTED Final    Comment: Performed at [facility], [address], [place], [place] [address]    Radiology Reports Dg Chest 2 View  Result Date: 10/29/2018 CLINICAL DATA:  Dry cough for few days, sore throat, diaphoresis EXAM: CHEST - 2 VIEW COMPARISON:  10/04/2018 FINDINGS: Normal heart size, mediastinal contours, and pulmonary vascularity. Lungs clear. No pulmonary infiltrate, pleural effusion, or pneumothorax. Bones demineralized but otherwise unremarkable. IMPRESSION: No acute abnormalities. Electronically Signed   By: [name] M.D.   On: 10/29/2018 13:17   Dg Chest 2 View  Result Date: 10/04/2018 CLINICAL DATA:  Cough for 4 days EXAM: CHEST - 2 VIEW COMPARISON:  05/08/2018 FINDINGS: Mild cardiomegaly. No overt pulmonary edema. Both lungs are clear. The visualized skeletal structures are unremarkable. IMPRESSION: No active cardiopulmonary disease.  Mild cardiomegaly Electronically Signed   By: Jasmine Pang M.D.   On: 10/04/2018 22:52   Dg Foot Complete Left  Result Date: 10/28/2018 CLINICAL DATA:  Left foot bleeding near surgical amputation site. Pain at surgical site. Rule out osteomyelitis. EXAM: LEFT FOOT - COMPLETE 3+ VIEW COMPARISON:  September 24, 2018 and October 04, 2018 FINDINGS: Three linear foreign bodies identified in the soft tissues, unchanged since September 24, 2018. The amputation sites were sharp on the previous study. The amputation sites are less sharp today including the distal aspects of the remaining second, third, fourth, and fifth metatarsals. The  distal second metatarsal appears more foreshortened today. IMPRESSION: 1. Bony erosion at the distal aspect of the remaining second, third, fourth, and fifth metatarsals concerning  for osteomyelitis. 2. Stable foreign bodies in the soft tissues. Electronically Signed   By: Gerome Sam III M.D   On: 10/28/2018 21:30   Dg Foot Complete Left  Result Date: 10/05/2018 CLINICAL DATA:  Recent foot surgery possible wound infection. EXAM: LEFT FOOT - COMPLETE 3+ VIEW COMPARISON:  Preoperative radiograph 09/24/2018 FINDINGS: Post transmetatarsal amputation of foot. The resection margins are sharp. No periosteal reaction or bony destructive change. Three linear foreign bodies projecting over the foot are unchanged prior exam, likely in the soft tissues, less likely external to the patient given persistence on pre and postoperative imaging. No tracking soft tissue air. Overlying dressing in place. IMPRESSION: Post transmetatarsal amputation of the foot. No radiographic findings of osteomyelitis. Electronically Signed   By: Narda Rutherford M.D.   On: 10/05/2018 00:04   Vas [place] Lower Extremity Venous (dvt)  Result Date: 10/09/2018  Lower Venous Study Indications: Edema.  Performing Technologist: Blanch Media RVS  Examination Guidelines: A complete evaluation includes B-mode imaging, spectral Doppler, color Doppler, and power Doppler as needed of all accessible portions of each vessel. Bilateral testing is considered an integral part of a complete examination. Limited examinations for reoccurring indications may be performed as noted.  Right Venous Findings: +---+---------------+---------+-----------+----------+-------+    CompressibilityPhasicitySpontaneityPropertiesSummary +---+---------------+---------+-----------+----------+-------+ CFVFull           Yes      Yes                          +---+---------------+---------+-----------+----------+-------+  Left Venous Findings:  +---------+---------------+---------+-----------+----------+--------------+          CompressibilityPhasicitySpontaneityPropertiesSummary        +---------+---------------+---------+-----------+----------+--------------+ CFV      Full           Yes      Yes                                 +---------+---------------+---------+-----------+----------+--------------+ SFJ      Full                                                        +---------+---------------+---------+-----------+----------+--------------+ FV Prox  Full                                                        +---------+---------------+---------+-----------+----------+--------------+ FV Mid   Full                                                        +---------+---------------+---------+-----------+----------+--------------+ FV DistalFull                                                        +---------+---------------+---------+-----------+----------+--------------+ PFV      Full                                                        +---------+---------------+---------+-----------+----------+--------------+  POP      Full           Yes      Yes                                 +---------+---------------+---------+-----------+----------+--------------+ PTV      Full                                                        +---------+---------------+---------+-----------+----------+--------------+ PERO                                                  Not visualized +---------+---------------+---------+-----------+----------+--------------+    Summary: Right: No evidence of common femoral vein obstruction. Left: There is no evidence of deep vein thrombosis in the lower extremity. No cystic structure found in the popliteal fossa.  *See table(s) above for measurements and observations. Electronically signed by Waverly Ferrarihristopher Dickson MD on 10/09/2018 at 4:27:36 PM.    Final    [place] Abdomen  Limited Ruq  Result Date: 10/30/2018 CLINICAL DATA:  Elevated total bilirubin. EXAM: ULTRASOUND ABDOMEN LIMITED RIGHT UPPER QUADRANT COMPARISON:  None. FINDINGS: Gallbladder: No gallstones. Borderline wall thickening measuring 3 mm. No sonographic Murphy sign noted by sonographer. Common bile duct: Diameter: 4 mm, normal. Liver: No focal lesion identified. Nodular contour. Heterogeneous, coarsened parenchymal echogenicity. Portal vein is patent on color Doppler imaging with normal direction of blood flow towards the liver. Recanalized umbilical vein. IMPRESSION: 1. Cirrhosis with recanalized umbilical vein. 2. Borderline gallbladder wall thickening without cholelithiasis, likely reflecting underlying liver disease. Electronically Signed   By: Obie DredgeWilliam T Derry M.D.   On: 10/30/2018 11:34    Lab Data:  CBC: Recent Labs  Lab 10/28/18 2130 10/29/18 0205 10/30/18 0307  WBC 8.4 6.9 4.6  NEUTROABS 5.9  --   --   HGB 10.9* 10.4* 9.4*  HCT 33.9* 30.3* 27.8*  MCV 96.6 94.1 95.5  PLT 125* 105* 82*   Basic Metabolic Panel: Recent Labs  Lab 10/28/18 2130 10/30/18 0307  NA 135 139  K 3.5 3.5  CL 106 110  CO2 22 25  GLUCOSE 148* 136*  BUN 6 8  CREATININE 0.90 0.73  CALCIUM 9.0 8.5*   GFR: Estimated Creatinine Clearance: 141.2 mL/min (by C-G formula based on SCr of 0.73 mg/dL). Liver Function Tests: Recent Labs  Lab 10/28/18 2130 10/29/18 0019 10/30/18 0307  AST 23 20 18   ALT 12 12 11   ALKPHOS 128* 123 102  BILITOT 2.1* 1.9* 1.3*  PROT 7.2 6.9 5.8*  ALBUMIN 3.5 3.4* 2.9*   No results for input(s): LIPASE, AMYLASE in the last 168 hours. No results for input(s): AMMONIA in the last 168 hours. Coagulation Profile: No results for input(s): INR, PROTIME in the last 168 hours. Cardiac Enzymes: No results for input(s): CKTOTAL, CKMB, CKMBINDEX, TROPONINI in the last 168 hours. BNP (last 3 results) No results for input(s): PROBNP in the last 8760 hours. HbA1C: No results for  input(s): HGBA1C in the last 72 hours. CBG: Recent Labs  Lab 10/31/18 1133 10/31/18 1636 10/31/18 2132 11/01/18 0725 11/01/18 1139  GLUCAP 178* 72 132* 102* 90  Lipid Profile: No results for input(s): CHOL, HDL, LDLCALC, TRIG, CHOLHDL, LDLDIRECT in the last 72 hours. Thyroid Function Tests: No results for input(s): TSH, T4TOTAL, FREET4, T3FREE, THYROIDAB in the last 72 hours. Anemia Panel: No results for input(s): VITAMINB12, FOLATE, FERRITIN, TIBC, IRON, RETICCTPCT in the last 72 hours. Urine analysis:    Component Value Date/Time   COLORURINE AMBER (A) 10/05/2018 1153   APPEARANCEUR CLEAR 10/05/2018 1153   LABSPEC 1.034 (H) 10/05/2018 1153   PHURINE 5.0 10/05/2018 1153   GLUCOSEU NEGATIVE 10/05/2018 1153   HGBUR NEGATIVE 10/05/2018 1153   BILIRUBINUR SMALL (A) 10/05/2018 1153   KETONESUR 5 (A) 10/05/2018 1153   PROTEINUR 30 (A) 10/05/2018 1153   NITRITE NEGATIVE 10/05/2018 1153   LEUKOCYTESUR NEGATIVE 10/05/2018 1153     Tamika Nou M.D. [facility] 11/01/2018, 2:34 PM     After 7pm go to [email or web address] - password [email or web address]  Call night coverage person covering after 7pm

## 2018-11-02 LAB — CREATININE, SERUM
Creatinine, Ser: 0.8 mg/dL (ref 0.61–1.24)
GFR calc Af Amer: >60 mL/min (ref >60)
GFR calc non Af Amer: >60 mL/min (ref >60)

## 2018-11-02 LAB — GLUCOSE, CAPILLARY
Glucose-Capillary: 188 mg/dL — ABNORMAL HIGH (ref 70–99)
Glucose-Capillary: 114 mg/dL — ABNORMAL HIGH (ref 70–99)

## 2018-11-02 LAB — VANCOMYCIN, PEAK: Vancomycin Pk: 25 ug/mL — ABNORMAL LOW (ref 30–40)

## 2018-11-02 LAB — VANCOMYCIN, TROUGH: Vancomycin Tr: 15 ug/mL (ref 15–20)

## 2018-11-02 MED ORDER — INSULIN ASPART 100 UNIT/ML ~~LOC~~ SOLN: 0.0000 [IU] | Freq: Three times a day (TID) | SUBCUTANEOUS | 11 refills | Status: DC

## 2018-11-02 MED ORDER — QUETIAPINE FUMARATE 200 MG PO TABS: 200.0000 mg | ORAL_TABLET | Freq: Every day | ORAL | 3 refills | Status: DC

## 2018-11-02 MED ORDER — GUAIFENESIN ER 600 MG PO TB12: 1200.0000 mg | ORAL_TABLET | Freq: Two times a day (BID) | ORAL | 0 refills | Status: AC

## 2018-11-02 MED ORDER — DOXYCYCLINE HYCLATE 100 MG PO TABS: 100.0000 mg | ORAL_TABLET | Freq: Two times a day (BID) | ORAL | 0 refills | Status: AC

## 2018-11-02 MED ORDER — TRAZODONE HCL 50 MG PO TABS: 50.0000 mg | ORAL_TABLET | Freq: Every evening | ORAL | 2 refills | Status: DC | PRN

## 2018-11-02 MED ORDER — LORATADINE 10 MG PO TABS: 10.0000 mg | ORAL_TABLET | Freq: Every day | ORAL | 1 refills | Status: DC | PRN

## 2018-11-02 NOTE — Progress Notes (Signed)
PT Cancellation Note  Patient Details Name: HARTSELL WIRKKALA MRN: [id number] DOB: [birth year]   Cancelled Treatment:    Reason Eval/Treat Not Completed: Patient declined, no reason specified Pt refused treatment despite max encouragement.  Pt educated on importance of mobility. PT will continue to follow acutely.   Mayer Camel, PTA Acute Rehab [phone]

## 2018-11-02 NOTE — Progress Notes (Signed)
Discharge instructions completed with pt.  Pt verbalized understanding of the information.  Pt denies chest pain, shortness of breath, dizziness, lightheadedness, and n/v.  Pt's IV discontinued.  Pt's dressing on left foot changed.  Pt discharged to facility.

## 2018-11-02 NOTE — NC FL2 (Signed)
MEDICAID FL2 LEVEL OF CARE SCREENING TOOL     IDENTIFICATION  Patient Name: Cory Palmer Birthdate: Jun 03, 1961 Sex: male Admission Date (Current Location): 10/28/2018  [facility] and [place] Number:  Producer, television/film/video and Address:  [facility] [place], [address] [place], [place] [address]      Provider Number: [id number]  Attending Physician Name and Address:  Shon Hale, MD  Relative Name and Phone Number:  [name] (brother) [phone]    Current Level of Care: Hospital Recommended Level of Care: Skilled Nursing Facility Prior Approval Number:    Date Approved/Denied:   PASRR Number: [id number]  Discharge Plan: SNF    Current Diagnoses: Patient Active Problem List   Diagnosis Date Noted  . Serum total bilirubin elevated 10/29/2018  . BPH (benign prostatic hyperplasia) 10/29/2018  . Non-pressure chronic ulcer of other part of left foot limited to breakdown of skin (HCC)   . Below-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)   . Osteomyelitis (HCC) 10/28/2018  . Hx of BKA, right (HCC)   . PAF (paroxysmal atrial fibrillation) (HCC)   . Bacteremia due to Gram-positive bacteria   . Hypokalemia   . Hypomagnesemia   . Sepsis without acute organ dysfunction (HCC) 10/05/2018  . Depressed bipolar disorder (HCC) 10/05/2018  . Hypertension 10/05/2018  . Hyperbilirubinemia 10/05/2018  . S/P transmetatarsal amputation of foot, 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  . Osteomyelitis of toe of left foot (HCC)   . Wound infection 05/07/2018  . Homelessness 05/07/2018  . Diabetes mellitus type 2, noninsulin dependent (HCC) 05/07/2018  . Acute osteomyelitis involving lower leg, right (HCC) 05/07/2018    Orientation RESPIRATION BLADDER Height & Weight     Self, Time, Situation, Place  Normal Continent Weight: 260 lb (117.9 kg) Height:  6\' 4"  (193 cm)  BEHAVIORAL SYMPTOMS/MOOD NEUROLOGICAL BOWEL NUTRITION STATUS      Continent Diet(see discharge  summary)  AMBULATORY STATUS COMMUNICATION OF NEEDS Skin   Limited Assist Verbally Other (Comment)(right sided BKA, left toe amputation)                       Personal Care Assistance Level of Assistance  Bathing, Feeding, Dressing, Total care Bathing Assistance: Independent Feeding assistance: Independent Dressing Assistance: Independent Total Care Assistance: Limited assistance   Functional Limitations Info  Sight, Hearing, Speech Sight Info: Impaired Hearing Info: Adequate Speech Info: Adequate    SPECIAL CARE FACTORS FREQUENCY  PT (By licensed PT), OT (By licensed OT)     PT Frequency: min 5x weekly OT Frequency: min 5x weekly            Contractures Contractures Info: Not present    Additional Factors Info  Code Status, Allergies, Isolation Precautions Code Status Info: full Allergies Info: No Known Allergies     Isolation Precautions Info: droplet pre- RVP Positive for Rhinovirus     Current Medications (11/02/2018):  This is the current hospital active medication list Current Facility-Administered Medications  Medication Dose Route Frequency Provider Last Rate Last Dose  . 0.9 %  sodium chloride infusion   Intravenous Continuous Rai, Delene Ruffini, MD   Stopped at 10/31/18 [id number]  . acetaminophen (TYLENOL) tablet 650 mg  650 mg Oral Q6H PRN John Giovanni, MD   650 mg at 10/30/18 4034   Or  . acetaminophen (TYLENOL) suppository 650 mg  650 mg Rectal Q6H PRN John Giovanni, MD      . benzonatate (TESSALON) capsule 100 mg  100  mg Oral TID Rai, Ripudeep K, MD   100 mg at 11/02/18 0859  . guaiFENesin (MUCINEX) 12 hr tablet 1,200 mg  1,200 mg Oral BID Schorr, Roma Kayser, NP   1,200 mg at 11/02/18 0859  . guaiFENesin-dextromethorphan (ROBITUSSIN DM) 100-10 MG/5ML syrup 5 mL  5 mL Oral Q4H PRN Rai, Ripudeep K, MD   5 mL at 10/31/18 1823  . insulin aspart (novoLOG) injection 0-9 Units  0-9 Units Subcutaneous TID WC John Giovanni, MD   2 Units at 11/02/18  0900  . loratadine (CLARITIN) tablet 10 mg  10 mg Oral Daily PRN Rai, Ripudeep K, MD   10 mg at 10/29/18 2103  . phenol (CHLORASEPTIC) mouth spray 1 spray  1 spray Mouth/Throat PRN Rai, Ripudeep K, MD   1 spray at 10/30/18 1715  . QUEtiapine (SEROQUEL) tablet 200 mg  200 mg Oral QHS Rai, Ripudeep K, MD   200 mg at 11/01/18 2143  . tamsulosin (FLOMAX) capsule 0.4 mg  0.4 mg Oral QPC supper John Giovanni, MD   0.4 mg at 11/01/18 1717  . traZODone (DESYREL) tablet 50 mg  50 mg Oral QHS PRN Rai, Ripudeep K, MD   50 mg at 11/01/18 2142  . vancomycin (VANCOCIN) 1,250 mg in sodium chloride 0.9 % 250 mL IVPB  1,250 mg Intravenous Q12H Quenton Fetter, RPH 166.7 mL/hr at 11/01/18 2149 1,250 mg at 11/01/18 2149     Discharge Medications: Please see discharge summary for a list of discharge medications.  Relevant Imaging Results:  Relevant Lab Results:   Additional Information SSN: [id number]  Gildardo Griffes, LCSW

## 2018-11-02 NOTE — Plan of Care (Signed)
  Problem: Pain Managment: Goal: General experience of comfort will improve Outcome: Progressing   

## 2018-11-02 NOTE — Progress Notes (Signed)
Pharmacy Antibiotic Note  Cory Palmer is a 58 y.o. male admitted on 10/28/2018 with wound infection.  Pharmacy has been consulted for Vancomycin dosing.  Patient presented s/p recent transmetatarsal amputation of L-foot on 09/28/18 with draining ulcer at site to start IV antibiotics per Dr. [name]. No surgery planned at this time.   Patient's SCr is 0.80 with normalized CrCl ~100 mL/min. Vancomycin levels were drawn and AUC came back within the desired range at 517.   Plan: Continue Vancomycin 1250 mg IV every 12 hours.   Height: 6\' 4"  (193 cm) Weight: 260 lb (117.9 kg) IBW/kg (Calculated) : 86.8  Temp (24hrs), Avg:98.2 F (36.8 C), Min:97.4 F (36.3 C), Max:98.5 F (36.9 C)  Recent Labs  Lab 10/28/18 2130 10/29/18 0205 10/30/18 0307 11/02/18 0152 11/02/18 0955  WBC 8.4 6.9 4.6  --   --   CREATININE 0.90  --  0.73 0.80  --   LATICACIDVEN 1.0  --   --   --   --   VANCOTROUGH  --   --   --   --  15  VANCOPEAK  --   --   --  25*  --     Estimated Creatinine Clearance: 141.2 mL/min (by C-G formula based on SCr of 0.8 mg/dL).    No Known Allergies  Antimicrobials this admission: Vancomycin 3/30 >>  Dose adjustments this admission:   Microbiology results: 3/30 BCx: no growth at 3 days  Thank you for allowing pharmacy to be a part of this patient's care.  Jeanella Cara, PharmD, [facility] Clinical Pharmacist Please see AMION for all Pharmacists' Contact Phone Numbers 11/02/2018, 11:07 AM

## 2018-11-02 NOTE — TOC Transition Note (Signed)
Transition of Care [facility]) - CM/SW Discharge Note   Patient Details  Name: Cory Palmer MRN: [id number] Date of Birth: October 22, 1960  Transition of Care [facility] CM/SW Contact:  Gildardo Griffes, LCSW Phone Number: 11/02/2018, 1:09 PM   Clinical Narrative:     Patient will DC to: [facility] Anticipated DC date: 11/02/2018 Family notified:patient will notify brother [name] Transport WN:[id number]  Per MD patient ready for DC to [facility] . RN, patient, patient's family, and facility notified of DC. Discharge Summary sent to facility. RN given number for report [phone]. DC packet on chart. Ambulance transport requested for patient.  CSW signing off.  [place], [place] [phone]    Final next level of care: Skilled Nursing Facility Barriers to Discharge: No Barriers Identified   Patient Goals and CMS Choice Patient states their goals for this hospitalization and ongoing recovery are:: to go to rehab then work with housing coalition to get his apartment CMS Medicare.gov Compare Post Acute Care list provided to:: Patient Choice offered to / list presented to : Patient  Discharge Placement   Existing PASRR number confirmed : 11/02/18          Patient chooses bed at: Other - please specify in the comment section below:([facility]) Patient to be transferred to facility by: [facility] Name of family member notified: patient will notify his brother Patient and family notified of of transfer: 11/02/18  Discharge Plan and Services   Discharge Planning Services: NA Post Acute Care Choice: Skilled Nursing Facility          DME Arranged: N/A DME Agency: NA HH Arranged: NA HH Agency: NA   Social Determinants of Health (SDOH) Interventions     Readmission Risk Interventions No flowsheet data found.

## 2018-11-02 NOTE — Discharge Instructions (Signed)
Osteomyelitis, Adult  Bone infections (osteomyelitis) occur when bacteria or other germs get inside a bone. This can happen if you have an infection in another part of your body that spreads through your blood. Germs from your skin or from outside of your body can also cause this type of infection if you have a wound or a broken bone (fracture) that breaks the skin. Bone infections need to be treated quickly to prevent bone damage and to prevent the infection from spreading to other areas of your body. What are the causes? Most bone infections are caused by bacteria. They can also be caused by other germs, such as viruses and funguses. What increases the risk? You are more likely to develop this condition if you:  Recently had surgery, especially bone or joint surgery.  Have a long-term (chronic) disease, such as: ? Diabetes. ? HIV (human immunodeficiency virus). ? Rheumatoid arthritis. ? Sickle cell anemia. ? Kidney disease that requires dialysis.  Are aged 26 years or older.  Have a condition or take medicines that block or weaken your body's defense system (immune system).  Have a condition that reduces your blood flow.  Have an artificial joint.  Have had a joint or bone repaired with plates or screws (surgical hardware).  Use IV drugs.  Have a central line for IV access.  Have had trauma, such as stepping on a nail or a broken bone that came through the skin. What are the signs or symptoms? Symptoms vary depending on the type and location of your infection. Common symptoms of bone infections include:  Fever and chills.  Skin redness and warmth.  Swelling.  Pain and stiffness.  Drainage of fluid or pus near the infection. How is this diagnosed? This condition may be diagnosed based on:  Your symptoms and medical history.  A physical exam.  Tests, such as: ? A sample of tissue, fluid, or blood taken to be examined under a microscope. ? Pus or discharge  swabbed from a wound for testing to identify germs and to determine what type of medicine will kill them (culture and sensitivity). ? Blood tests.  Imaging studies. These may include: ? X-rays. ? MRI. ? CT scan. ? Bone scan. ? Ultrasound. How is this treated? Treatment for this condition depends on the cause and type of infection. Antibiotic medicines are usually the first treatment for a bone infection. This may be done in a hospital at first. You may have to continue IV antibiotics at home or take antibiotics by mouth for several weeks after that. Other treatments may include surgery to remove:  Dead or dying tissue from a bone.  An infected artificial joint.  Infected plates or screws that were used to repair a broken bone. Follow these instructions at home: Medicines   Take over-the-counter and prescription medicines only as told by your health care provider.  Take your antibiotic medicine as told by your health care provider. Do not stop taking the antibiotic even if you start to feel better.  Follow instructions from your health care provider about how to take IV antibiotics at home. You may need to have a nurse come to your home to give you the IV antibiotics. General instructions   Ask your health care provider if you have any restrictions on your activities.  If directed, put ice on the affected area: ? Put ice in a plastic bag. ? Place a towel between your skin and the bag. ? Leave the ice on for  20 minutes, 2-3 times a day.  Wash your hands often with soap and water. If soap and water are not available, use hand sanitizer.  Do not use any products that contain nicotine or tobacco, such as cigarettes and e-cigarettes. These can delay bone healing. If you need help quitting, ask your health care provider.  Keep all follow-up visits as told by your health care provider. This is important. Contact a health care provider if:  You develop a fever or chills.  You  have redness, warmth, pain, or swelling that returns after treatment. Get help right away if:  You have rapid breathing or you have trouble breathing.  You have chest pain.  You cannot drink fluids or make urine.  The affected area swells, changes color, or turns blue.  You have numbness or severe pain in the affected area. Summary  Bone infections (osteomyelitis) occur when bacteria or other germs get inside a bone.  You may be more likely to get this type of infection if you have a condition, such as diabetes, that lowers your ability to fight infection or increases your chances of getting an infection.  Most bone infections are caused by bacteria. They can also be caused by other germs, such as viruses and funguses.  Treatment for this condition usually starts with taking antibiotics. Further treatment depends on the cause and type of infection. This information is not intended to replace advice given to you by your health care provider. Make sure you discuss any questions you have with your health care provider. Document Released: 07/18/2005 Document Revised: 07/27/2017 Document Reviewed: 07/27/2017 Elsevier Interactive Patient Education  2019 [employer] 1) wound care consult at facility for left foot wound 2) follow-up with orthopedic surgeon Dr [name] in 1 week as previously advised for recheck of wound 3) take medications including doxycycline as advised 4)Use [name]

## 2018-11-02 NOTE — TOC Initial Note (Signed)
Transition of Care [facility]) - Initial/Assessment Note    Patient Details  Name: Cory Palmer MRN: [id number] Date of Birth: Dec 28, 1960  Transition of Care [facility] CM/SW Contact:    Cory Griffes, LCSW Phone Number: 11/02/2018, 10:26 AM  Clinical Narrative:                  CSW consulted with patient regarding SNF recommendation at discharge. Patient has continued to refuse SNF. Other discharge option includes patient going to [facility] as he is homeless. Patient reports he does not want to do this either. On this day patient has agreed to go to SNF for short term rehab. Patient's chart was previously faxed to [facility] last month, patient was accepted to this facility however at discharge decided to go to shelter. CSW once again faxed referral to [facility], awaiting confirmation of bed today. Will notify careteam when bed is available for patient to discharge today to [facility]. Patient gave CSW permission to contact his brother Cory Palmer however Cory Palmer has not answered phone calls, CSW lvm but no return call yet.  Expected Discharge Plan: Skilled Nursing Facility Barriers to Discharge: No Barriers Identified   Patient Goals and CMS Choice Patient states their goals for this hospitalization and ongoing recovery are:: to go to rehab then get into his apartment CMS Medicare.gov Compare Post Acute Care list provided to:: Patient Choice offered to / list presented to : Patient  Expected Discharge Plan and Services Expected Discharge Plan: Skilled Nursing Facility   Discharge Planning Services: NA Post Acute Care Choice: Skilled Nursing Facility Living arrangements for the past 2 months: Hotel/Motel                 DME Arranged: N/A DME Agency: NA HH Arranged: NA HH Agency: NA  Prior Living Arrangements/Services Living arrangements for the past 2 months: Hotel/Motel Lives with:: Self Patient language and need for interpreter reviewed:: Yes Do you  feel safe going back to the place where you live?: No   patient needs skilled nursing short term rehab before returning home  Need for Family Participation in Patient Care: No (Comment) Care giver support system in place?: Yes (comment)   Criminal Activity/Legal Involvement Pertinent to Current Situation/Hospitalization: No - Comment as needed  Activities of Daily Living Home Assistive Devices/Equipment: None ADL Screening (condition at time of admission) Patient's cognitive ability adequate to safely complete daily activities?: Yes Is the patient deaf or have difficulty hearing?: No Does the patient have difficulty seeing, even when wearing glasses/contacts?: No Does the patient have difficulty concentrating, remembering, or making decisions?: No Patient able to express need for assistance with ADLs?: Yes Does the patient have difficulty dressing or bathing?: No Independently performs ADLs?: Yes (appropriate for developmental age) Does the patient have difficulty walking or climbing stairs?: No Weakness of Legs: None Weakness of Arms/Hands: None  Permission Sought/Granted Permission sought to share information with : Case Manager, Magazine features editor, Family Supports Permission granted to share information with : Yes, Verbal Permission Granted  Share Information with NAME: Cory Palmer  Permission granted to share info w AGENCY: SNFs  Permission granted to share info w Relationship: brother  Permission granted to share info w Contact Information: [phone]  Emotional Assessment Appearance:: Appears stated age Attitude/Demeanor/Rapport: Gracious Affect (typically observed): Accepting Orientation: : Oriented to Self, Oriented to Place, Oriented to  Time, Oriented to Situation Alcohol / Substance Use: Not Applicable Psych Involvement: No (comment)  Admission diagnosis:  Total bilirubin, elevated [R17]  Other acute osteomyelitis of left foot (HCC) [M86.172] Osteomyelitis  (HCC) [M86.9] Patient Active Problem List   Diagnosis Date Noted  . Serum total bilirubin elevated 10/29/2018  . BPH (benign prostatic hyperplasia) 10/29/2018  . Non-pressure chronic ulcer of other part of left foot limited to breakdown of skin (HCC)   . Below-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)   . Osteomyelitis (HCC) 10/28/2018  . Hx of BKA, right (HCC)   . PAF (paroxysmal atrial fibrillation) (HCC)   . Bacteremia due to Gram-positive bacteria   . Hypokalemia   . Hypomagnesemia   . Sepsis without acute organ dysfunction (HCC) 10/05/2018  . Depressed bipolar disorder (HCC) 10/05/2018  . Hypertension 10/05/2018  . Hyperbilirubinemia 10/05/2018  . S/P transmetatarsal amputation of foot, 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  . Osteomyelitis of toe of left foot (HCC)   . Wound infection 05/07/2018  . Homelessness 05/07/2018  . Diabetes mellitus type 2, noninsulin dependent (HCC) 05/07/2018  . Acute osteomyelitis involving lower leg, right (HCC) 05/07/2018   PCP:  Patient, [name] Per Pharmacy:   [facility] [place], [place] - [address] [place] [address] Phone: [phone] Fax: [phone]     Social Determinants of Health (SDOH) Interventions    Readmission Risk Interventions No flowsheet data found.

## 2018-11-02 NOTE — Discharge Summary (Signed)
Cory Palmer, is a 58 y.o. male  DOB 1961-03-23  MRN [id number].  Admission date:  10/28/2018  Admitting Physician  John Giovanni, MD  Discharge Date:  11/02/2018   Primary MD  Patient, [name] Per  Recommendations for primary care physician for things to follow:   1)Wound care consult at facility for left foot wound 2)Follow-up with orthopedic surgeon Dr [name] in 1 week as previously advised for recheck of wound 3)Take medications including doxycycline as advised 4)Use [name]   Admission Diagnosis  Total bilirubin, elevated [R17] Other acute osteomyelitis of left foot (HCC) [M86.172] Osteomyelitis (HCC) [M86.9]   Discharge Diagnosis  Total bilirubin, elevated [R17] Other acute osteomyelitis of left foot (HCC) [M86.172] Osteomyelitis (HCC) [M86.9]    Principal Problem:   Osteomyelitis (HCC) Active Problems:   Diabetes mellitus type 2, noninsulin dependent (HCC)   PAF (paroxysmal atrial fibrillation) (HCC)   Serum total bilirubin elevated   BPH (benign prostatic hyperplasia)   Non-pressure chronic ulcer of other part of left foot limited to breakdown of skin (HCC)   Below-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)      Past Medical History:  Diagnosis Date  . Anxiety   . Depressed bipolar disorder (HCC)   . Diabetes mellitus without complication (HCC)   . Hypertension     Past Surgical History:  Procedure Laterality Date  . AMPUTATION Left 09/28/2018   Procedure: LEFT TRANSMETATARSAL AMPUTATION;  Surgeon: Nadara Mustard, MD;  Location: [facility] OR;  Service: Orthopedics;  Laterality: Left;  . BELOW KNEE LEG AMPUTATION Right      HPI  from the history and physical done on the day of admission:    Chief Complaint: Bleeding from left foot surgical site  HPI: Cory Palmer is a 58 y.o. male with medical history significant of anxiety, bipolar disorder with depression, type 2  diabetes, hypertension, paroxysmal atrial fibrillation, cocaine use, right-sided BKA, recent transmetatarsal amputation of left foot on 2/28 by Dr. [name] presenting to the hospital for evaluation bleeding from his foot wound surgical site for the past 5 days.  He has not noticed any pus drainage.  He is also having some slight pain in this foot.  States he has not had a chance to see Dr. [name] since his recent hospital discharge due to transportation problems.  States he finished his antibiotic course but has not taken any of his diabetes medications since his hospital discharge.  Denies having any fevers, chills, chest pain, cough, shortness of breath, abdominal pain, nausea, or vomiting.  Patient recently had left foot transmetatarsal amputation on 2/28.  He was admitted to the hospital 3/5 to 3/19 for sepsis secondary to MSSA/strep bacteremia.  No osteomyelitis.  ID and orthopedics were consulted.  He was treated with IV Ancef during the hospitalization and switched to Keflex. Last day of antibiotics 3/20.  TTE performed 3/6 negative for endocarditis.   Hospital Course:   Brief Summary:- Cory Palmer a 57 y.o.malewith anxiety, bipolar disorder with depression, type  2 diabetes, hypertension, paroxysmal atrial fibrillation, cocaine use, right-sided BKA, recent transmetatarsal amputation of left foot on 2/28 by Dr. [name] presented to ED for evaluation bleeding from his foot woundsurgical site for the past 5 days. He did not noticed any pus drainage, also having some slight pain in this foot. States he had not had a chance to see Dr. [name] since his recent hospital discharge due to transportation problems. He finished his antibiotic course but has not taken any of his diabetes medications since his hospital discharge. Denied having any fevers, chills, chest pain, cough, shortness of breath, abdominal pain, nausea, or vomiting. Patient recently had left foot transmetatarsal amputation on 2/28.Hewas  admitted to the hospital 3/5 to 3/19 for sepsis secondary to MSSA/strep bacteremia. No osteomyelitis. ID and orthopedics were consulted. He was treated with IV Ancef during the hospitalization and switched to Keflex.Last day of antibiotics 3/20. TTE performed 10/05/18 negativefor endocarditis.  Plan:- Osteomyelitis (HCC) of the left foot with recent transmetatarsal amputation of left foot on 09/28/18 -Afebrile, no leukocytosis, normal lactic acid, no sepsis at this time. -X-ray of the left foot showed bony erosion at the distal aspect of the remaining 2nd,3rd,4th and 5th metatarsals concerning for osteomyelitis -Orthopedics was consulted, seen by Dr. [name].  Initially treated with IV vancomycin, as per Dr. [name] okay to hold off on surgery at this time . Patient had MSSA/strep bacteremia during previous admission, was treated with IV Ancef and then switched to Keflex, completed antibiotics on 10/19/18.Cory Palmer Kitchen... Treated with IV vancomycin this admission, -Blood cultures so far negative, no surgery planned, DC on oral doxycycline with outpatient follow-up with Dr. [name] within a week or so.....Cory Palmer Kitchenthe patient is to use [name]   Acute metabolic encephalopathy -more awake, continue Seroquel and trazodone which patient apparently has been taking for years at  200 mg at bedtime and trazodone at 50 mg as needed    Liver cirrhosis with mildly elevated total bilirubin -Right upper quadrant ultrasound consistent with liver cirrhosis without acute findings, LFTs normalized, bilirubin decreasing from 2.1 on 10/28/18 to 1.3   Diabetes mellitus type 2 - A1c 6.6 on 2/28, reflecting overall good diabetic control, continue metformin and use  sliding scale  -CBGs stable  Paroxysmal Afib  -Currently normal sinus rhythm, per DC summary, was seen by cardiology during the previous hospitalization.  EF 60-65%.  Not a candidate for long-term anticoagulation due to chronic anemia/thrombocytopenia and history of  noncompliance  BPH Stable, continue Flomax  Rhinovirus positive -Flu negative, chest x-ray did not show any infiltrates, RVP positive for rhinovirus -No hypoxia, lungs clear, follow closely, patient is homeless and has been around people, does not know any sick contacts for COVID Stable from a cardiopulmonary standpoint  Code Status: Full code DVT Prophylaxis:   SCD's Family Communication: Discussed in detail with the patient,  Pt gave permission to call his brother [name] at [phone] to discuss dc options...... patient brother is not answering his phone,  unable to reach patient's brother  Disposition Plan: Patient agreeable to go to SNF [facility] in [place]-- A, patient states he may be open to going to SNF for rehab as he needs a short term place to live before he gets his apartment  Procedures:  None  Consultants:   Orthopedics  Discharge Condition: stable  Follow UP  Follow-up Information    Nadara Mustard, MD Follow up in 1 week(s).   Specialty:  Orthopedic Surgery Contact information: [address] [place] [address] [phone]  Diet and Activity recommendation:  As advised  Discharge Instructions    Discharge Instructions    Call MD for:  difficulty breathing, headache or visual disturbances   Complete by:  As directed    Call MD for:  persistant dizziness or light-headedness   Complete by:  As directed    Call MD for:  persistant nausea and vomiting   Complete by:  As directed    Call MD for:  temperature >100.4   Complete by:  As directed    Diet - low sodium heart healthy   Complete by:  As directed    Diet Carb Modified   Complete by:  As directed    Discharge instructions   Complete by:  As directed    1) wound care consult at facility for left foot wound 2) follow-up with orthopedic surgeon Dr Lajoyce Cornersuda in 2 weeks as previously advised for recheck of wound 3) take medications including doxycycline as  advised 4)Use [facility]   Increase activity slowly   Complete by:  As directed       Discharge Medications     Allergies as of 11/02/2018   No Known Allergies     Medication List    TAKE these medications   acetaminophen 325 MG tablet Commonly known as:  TYLENOL Take 2 tablets (650 mg total) by mouth every 6 (six) hours as needed for mild pain (or Fever >/= 101).   doxycycline 100 MG tablet Commonly known as:  VIBRA-TABS Take 1 tablet (100 mg total) by mouth 2 (two) times daily for 10 days.   feeding supplement (GLUCERNA SHAKE) Liqd Take 237 mLs by mouth 2 (two) times daily between meals.   guaiFENesin 600 MG 12 hr tablet Commonly known as:  MUCINEX Take 2 tablets (1,200 mg total) by mouth 2 (two) times daily for 10 days.   insulin aspart 100 UNIT/ML injection Commonly known as:  novoLOG Inject 0-9 Units into the skin 3 (three) times daily with meals.   loratadine 10 MG tablet Commonly known as:  CLARITIN Take 1 tablet (10 mg total) by mouth daily as needed for allergies, rhinitis or itching.   metFORMIN 1000 MG tablet Commonly known as:  GLUCOPHAGE Take 1 tablet (1,000 mg total) by mouth 2 (two) times daily with a meal.   multivitamin with minerals Tabs tablet Take 1 tablet by mouth daily.   QUEtiapine 200 MG tablet Commonly known as:  SEROQUEL Take 1 tablet (200 mg total) by mouth at bedtime. What changed:    medication strength  how much to take   tamsulosin 0.4 MG Caps capsule Commonly known as:  FLOMAX Take 1 capsule (0.4 mg total) by mouth daily after supper for 30 days.   traZODone 50 MG tablet Commonly known as:  DESYREL Take 1 tablet (50 mg total) by mouth at bedtime as needed for sleep. What changed:    medication strength  how much to take  when to take this  reasons to take this       Major procedures and Radiology Reports - PLEASE review detailed and final reports for all details, in brief -   Dg Chest 2 View  Result  Date: 10/29/2018 CLINICAL DATA:  Dry cough for few days, sore throat, diaphoresis EXAM: CHEST - 2 VIEW COMPARISON:  10/04/2018 FINDINGS: Normal heart size, mediastinal contours, and pulmonary vascularity. Lungs clear. No pulmonary infiltrate, pleural effusion, or pneumothorax. Bones demineralized but otherwise unremarkable. IMPRESSION: No acute abnormalities. Electronically Signed  By: [name] M.D.   On: 10/29/2018 13:17   Dg Chest 2 View  Result Date: 10/04/2018 CLINICAL DATA:  Cough for 4 days EXAM: CHEST - 2 VIEW COMPARISON:  05/08/2018 FINDINGS: Mild cardiomegaly. No overt pulmonary edema. Both lungs are clear. The visualized skeletal structures are unremarkable. IMPRESSION: No active cardiopulmonary disease.  Mild cardiomegaly Electronically Signed   By: Jasmine Pang M.D.   On: 10/04/2018 22:52   Dg Foot Complete Left  Result Date: 10/28/2018 CLINICAL DATA:  Left foot bleeding near surgical amputation site. Pain at surgical site. Rule out osteomyelitis. EXAM: LEFT FOOT - COMPLETE 3+ VIEW COMPARISON:  September 24, 2018 and October 04, 2018 FINDINGS: Three linear foreign bodies identified in the soft tissues, unchanged since September 24, 2018. The amputation sites were sharp on the previous study. The amputation sites are less sharp today including the distal aspects of the remaining second, third, fourth, and fifth metatarsals. The distal second metatarsal appears more foreshortened today. IMPRESSION: 1. Bony erosion at the distal aspect of the remaining second, third, fourth, and fifth metatarsals concerning for osteomyelitis. 2. Stable foreign bodies in the soft tissues. Electronically Signed   By: Gerome Sam III M.D   On: 10/28/2018 21:30   Dg Foot Complete Left  Result Date: 10/05/2018 CLINICAL DATA:  Recent foot surgery possible wound infection. EXAM: LEFT FOOT - COMPLETE 3+ VIEW COMPARISON:  Preoperative radiograph 09/24/2018 FINDINGS: Post transmetatarsal amputation of foot. The  resection margins are sharp. No periosteal reaction or bony destructive change. Three linear foreign bodies projecting over the foot are unchanged prior exam, likely in the soft tissues, less likely external to the patient given persistence on pre and postoperative imaging. No tracking soft tissue air. Overlying dressing in place. IMPRESSION: Post transmetatarsal amputation of the foot. No radiographic findings of osteomyelitis. Electronically Signed   By: Narda Rutherford M.D.   On: 10/05/2018 00:04   Vas [place] Lower Extremity Venous (dvt)  Result Date: 10/09/2018  Lower Venous Study Indications: Edema.  Performing Technologist: Blanch Media RVS  Examination Guidelines: A complete evaluation includes B-mode imaging, spectral Doppler, color Doppler, and power Doppler as needed of all accessible portions of each vessel. Bilateral testing is considered an integral part of a complete examination. Limited examinations for reoccurring indications may be performed as noted.  Right Venous Findings: +---+---------------+---------+-----------+----------+-------+    CompressibilityPhasicitySpontaneityPropertiesSummary +---+---------------+---------+-----------+----------+-------+ CFVFull           Yes      Yes                          +---+---------------+---------+-----------+----------+-------+  Left Venous Findings: +---------+---------------+---------+-----------+----------+--------------+          CompressibilityPhasicitySpontaneityPropertiesSummary        +---------+---------------+---------+-----------+----------+--------------+ CFV      Full           Yes      Yes                                 +---------+---------------+---------+-----------+----------+--------------+ SFJ      Full                                                        +---------+---------------+---------+-----------+----------+--------------+ FV Prox  Full                                                         +---------+---------------+---------+-----------+----------+--------------+  FV Mid   Full                                                        +---------+---------------+---------+-----------+----------+--------------+ FV DistalFull                                                        +---------+---------------+---------+-----------+----------+--------------+ PFV      Full                                                        +---------+---------------+---------+-----------+----------+--------------+ POP      Full           Yes      Yes                                 +---------+---------------+---------+-----------+----------+--------------+ PTV      Full                                                        +---------+---------------+---------+-----------+----------+--------------+ PERO                                                  Not visualized +---------+---------------+---------+-----------+----------+--------------+    Summary: Right: No evidence of common femoral vein obstruction. Left: There is no evidence of deep vein thrombosis in the lower extremity. No cystic structure found in the popliteal fossa.  *See table(s) above for measurements and observations. Electronically signed by Waverly Ferrari MD on 10/09/2018 at 4:27:36 PM.    Final    US Abdomen Limited Ruq  Result Date: 10/30/2018 CLINICAL DATA:  Elevated total bilirubin. EXAM: ULTRASOUND ABDOMEN LIMITED RIGHT UPPER QUADRANT COMPARISON:  None. FINDINGS: Gallbladder: No gallstones. Borderline wall thickening measuring 3 mm. No sonographic Murphy sign noted by sonographer. Common bile duct: Diameter: 4 mm, normal. Liver: No focal lesion identified. Nodular contour. Heterogeneous, coarsened parenchymal echogenicity. Portal vein is patent on color Doppler imaging with normal direction of blood flow towards the liver. Recanalized umbilical vein. IMPRESSION: 1. Cirrhosis with recanalized  umbilical vein. 2. Borderline gallbladder wall thickening without cholelithiasis, likely reflecting underlying liver disease. Electronically Signed   By: Obie Dredge M.D.   On: 10/30/2018 11:34    Micro Results   Recent Results (from the past 240 hour(s))  Culture, blood (routine x 2)     Status: None (Preliminary result)   Collection Time: 10/29/18 12:01 AM  Result Value Ref Range Status   Specimen Description BLOOD RIGHT ARM  Final   Special Requests   Final    BOTTLES DRAWN AEROBIC AND ANAEROBIC Blood Culture results may not be optimal due  to an excessive volume of blood received in culture bottles   Culture   Final    NO GROWTH 4 DAYS Performed at [facility], [address] [place], [place] [address]    Report Status PENDING  Incomplete  Culture, blood (routine x 2)     Status: None (Preliminary result)   Collection Time: 10/29/18 12:01 AM  Result Value Ref Range Status   Specimen Description BLOOD RIGHT ARM  Final   Special Requests   Final    BOTTLES DRAWN AEROBIC ONLY Blood Culture adequate volume   Culture   Final    NO GROWTH 4 DAYS Performed at [facility], [address] [place], [place] [address]    Report Status PENDING  Incomplete  Respiratory Panel by PCR     Status: Abnormal   Collection Time: 10/29/18 12:19 PM  Result Value Ref Range Status   Adenovirus NOT DETECTED NOT DETECTED Final   Coronavirus 229E NOT DETECTED NOT DETECTED Final    Comment: (NOTE) The Coronavirus on the Respiratory Panel, DOES NOT test for the novel  Coronavirus (2019 nCoV)    Coronavirus HKU1 NOT DETECTED NOT DETECTED Final   Coronavirus NL63 NOT DETECTED NOT DETECTED Final   Coronavirus OC43 NOT DETECTED NOT DETECTED Final   Metapneumovirus NOT DETECTED NOT DETECTED Final   Rhinovirus / Enterovirus DETECTED (A) NOT DETECTED Final   Influenza A NOT DETECTED NOT DETECTED Final   Influenza B NOT DETECTED NOT DETECTED Final   Parainfluenza Virus 1 NOT DETECTED NOT  DETECTED Final   Parainfluenza Virus 2 NOT DETECTED NOT DETECTED Final   Parainfluenza Virus 3 NOT DETECTED NOT DETECTED Final   Parainfluenza Virus 4 NOT DETECTED NOT DETECTED Final   Respiratory Syncytial Virus NOT DETECTED NOT DETECTED Final   Bordetella pertussis NOT DETECTED NOT DETECTED Final   Chlamydophila pneumoniae NOT DETECTED NOT DETECTED Final   Mycoplasma pneumoniae NOT DETECTED NOT DETECTED Final    Comment: Performed at [facility], [address], [place], [place] [address]       Today   Subjective    Calan Otanez today has no new complaints, No fever  Or chills , No nausea, vomiting, diarrhea           Patient has been seen and examined prior to discharge   Objective   Blood pressure 113/62, pulse (!) 56, temperature (!) 97.4 F (36.3 C), temperature source Oral, resp. rate 16, height 6\' 4"  (1.93 m), weight 117.9 kg, SpO2 97 %.   Intake/Output Summary (Last 24 hours) at 11/02/2018 1238 Last data filed at 11/02/2018 0052 Gross per 24 hour  Intake 480 ml  Output 1950 ml  Net -1470 ml    Exam Gen:- Awake Alert,  In no apparent distress  HEENT:- [place].AT, No sclera icterus Neck-Supple Neck,No JVD,.  Lungs-  CTAB , fair air movement CV- S1, S2 normal, irregular Abd-  +ve B.Sounds, Abd Soft, No tenderness,    Extremity/Skin:-Warm and dry, good pedal pulses Psych-affect is appropriate, oriented x3 Neuro-no new focal deficits, no tremors MSK--right old BKA, left foot transmetatarsal amputation with dressing on   Data Review   CBC w Diff:  Lab Results  Component Value Date   WBC 4.6 10/30/2018   HGB 9.4 (L) 10/30/2018   HCT 27.8 (L) 10/30/2018   PLT 82 (L) 10/30/2018   LYMPHOPCT 16 10/28/2018   MONOPCT 8 10/28/2018   EOSPCT 5 10/28/2018   BASOPCT 1 10/28/2018  CMP:  Lab Results  Component Value Date   NA 139 10/30/2018   K 3.5 10/30/2018   CL 110 10/30/2018   CO2 25 10/30/2018   BUN 8 10/30/2018   CREATININE 0.80 11/02/2018   PROT 5.8  (L) 10/30/2018   ALBUMIN 2.9 (L) 10/30/2018   BILITOT 1.3 (H) 10/30/2018   ALKPHOS 102 10/30/2018   AST 18 10/30/2018   ALT 11 10/30/2018  .   Total Discharge time is about 33 minutes  Shon Hale M.D on 11/02/2018 at 12:38 PM  Go to [email or web address] -  for contact info  [facility] - Office  [phone]

## 2018-11-03 LAB — CULTURE, BLOOD (ROUTINE X 2)
Culture: NO GROWTH
Culture: NO GROWTH
Special Requests: ADEQUATE

## 2018-11-10 NOTE — Progress Notes (Deleted)
Virtual Visit via Telephone Note  I connected with Cory Palmer on 11/10/18 at  9:00 AM EDT by telephone and verified that I am speaking with the correct person using two identifiers.   I discussed the limitations, risks, security and privacy concerns of performing an evaluation and management service by telephone and the availability of in person appointments. I also discussed with the patient that there may be a patient responsible charge related to this service. The patient expressed understanding and agreed to proceed.   History of Present Illness: Admission date:  10/28/2018  Admitting Physician  Cory Giovanni, MD  Discharge Date:  11/02/2018   Primary MD  Patient, [name] Per  Recommendations for primary care physician for things to follow:   1)Wound care consult at facility for left foot wound 2)Follow-up with orthopedic surgeon Dr [name] in 1 week as previously advised for recheck of wound 3)Take medications including doxycycline as advised 4)Use [name]   Admission Diagnosis  Total bilirubin, elevated [R17] Other acute osteomyelitis of left foot (HCC) [M86.172] Osteomyelitis (HCC) [M86.9]   Discharge Diagnosis  Total bilirubin, elevated [R17] Other acute osteomyelitis of left foot (HCC) [M86.172] Osteomyelitis (HCC) [M86.9]    Principal Problem:   Osteomyelitis (HCC) Active Problems:   Diabetes mellitus type 2, noninsulin dependent (HCC)   PAF (paroxysmal atrial fibrillation) (HCC)   Serum total bilirubin elevated   BPH (benign prostatic hyperplasia)   Non-pressure chronic ulcer of other part of left foot limited to breakdown of skin (HCC)   Below-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)          Past Medical History:  Diagnosis Date  . Anxiety   . Depressed bipolar disorder (HCC)   . Diabetes mellitus without complication (HCC)   . Hypertension          Past Surgical History:  Procedure Laterality Date  . AMPUTATION Left  09/28/2018   Procedure: LEFT TRANSMETATARSAL AMPUTATION;  Surgeon: Nadara Mustard, MD;  Location: [facility] OR;  Service: Orthopedics;  Laterality: Left;  . BELOW KNEE LEG AMPUTATION Right      HPI  from the history and physical done on the day of admission:    Chief Complaint:Bleeding from left foot surgical site  SEG:BTDVVO Cory Palmer a 57 y.o.malewith medical history significant ofanxiety, bipolar disorder with depression, type 2 diabetes, hypertension, paroxysmal atrial fibrillation, cocaine use, right-sided BKA, recent transmetatarsal amputation of left foot on 2/28 by Dr. [name] presenting to the hospital for evaluation bleeding from his foot woundsurgical site for the past 5 days. He has not noticed any pus drainage. He is also having some slight pain in this foot. States he has not had a chance to see Dr. [name] since his recent hospital discharge due to transportation problems. States he finished his antibiotic course but has not taken any of his diabetes medications since his hospital discharge. Denies having any fevers, chills, chest pain, cough, shortness of breath, abdominal pain, nausea, or vomiting.  Patient recently had left foot transmetatarsal amputation on 2/28.Hewas admitted to the hospital 3/5 to 3/19 for sepsis secondary to MSSA/strep bacteremia. No osteomyelitis. ID and orthopedics were consulted. He was treated with IV Ancef during the hospitalization and switched to Keflex.Last day of antibiotics 3/20. TTE performed 3/6 negativefor endocarditis.   Hospital Course:   Brief Summary:- [name] a 57 y.o.malewith anxiety, bipolar disorder with depression, type 2 diabetes, hypertension, paroxysmal atrial fibrillation, cocaine use, right-sided BKA, recent transmetatarsal amputation of left foot  on 2/28 by Dr. Lajoyce Cornersuda presented to ED for evaluation bleeding from his foot woundsurgical site for the past 5 days. He did not noticed any pus drainage, also having  some slight pain in this foot. States he had not had a chance to see Dr. Lajoyce Cornersuda since his recent hospital discharge due to transportation problems. He finished his antibiotic course but has not taken any of his diabetes medications since his hospital discharge. Denied having any fevers, chills, chest pain, cough, shortness of breath, abdominal pain, nausea, or vomiting. Patient recently had left foot transmetatarsal amputation on 2/28.Hewas admitted to the hospital 3/5 to 3/19 for sepsis secondary to MSSA/strep bacteremia. No osteomyelitis. ID and orthopedics were consulted. He was treated with IV Ancef during the hospitalization and switched to Keflex.Last day of antibiotics 3/20. TTE performed 10/05/18 negativefor endocarditis.  Plan:- Osteomyelitis (HCC) of the left foot with recent transmetatarsal amputation of left foot on 09/28/18 -Afebrile, no leukocytosis, normal lactic acid, no sepsis at this time. -X-ray of the left foot showed bony erosion at the distal aspect of the remaining 2nd,3rd,4th and 5th metatarsals concerning for osteomyelitis -Orthopedics was consulted, seen by Dr. Lajoyce Cornersuda.  Initially treated with IV vancomycin, as per Dr. Lajoyce Cornersuda okay to hold off on surgery at this time . Patient had MSSA/strep bacteremia during previous admission, was treated with IV Ancef and then switched to Keflex, completed antibiotics on 10/19/18.Marland Kitchen.... Treated with IV vancomycin this admission, -Blood cultures so far negative, no surgery planned, DC on oral doxycycline with outpatient follow-up with Dr. Lajoyce Cornersuda within a week or so.....Marland Kitchen.the patientis to use [facility]   Acute metabolic encephalopathy -more awake, continue Seroquel and trazodone which patient apparently has been taking for years at 200 mg at bedtime and trazodone at 50 mg as needed   Liver cirrhosis with mildly elevated total bilirubin -Right upper quadrant ultrasound consistent with liver cirrhosis without acute findings, LFTs  normalized, bilirubin decreasing from 2.1 on 10/28/18 to 1.3   Diabetes mellitus type 2 - A1c 6.6 on 2/28, reflecting overall good diabetic control, continue metformin and use  sliding scale  -CBGs stable  Paroxysmal Afib  -Currently normal sinus rhythm, per DC summary, was seen by cardiology during the previous hospitalization. EF 60-65%. Not a candidate for long-term anticoagulation due to chronic anemia/thrombocytopenia and history of noncompliance  BPH Stable, continue Flomax  Rhinovirus positive -Flu negative, chest x-ray did not show any infiltrates, RVP positive for rhinovirus -No hypoxia, lungs clear, follow closely, patient is homeless and has been around people, does not know any sick contacts for COVID Stable from a cardiopulmonary standpoint     Observations/Objective:   Assessment and Plan:   Follow Up Instructions:    I discussed the assessment and treatment plan with the patient. The patient was provided an opportunity to ask questions and all were answered. The patient agreed with the plan and demonstrated an understanding of the instructions.   The patient was advised to call back or seek an in-person evaluation if the symptoms worsen or if the condition fails to improve as anticipated.  I provided *** minutes of non-face-to-face time during this encounter.   Shan LevansPatrick Wright, MD

## 2018-11-12 ENCOUNTER — Inpatient Hospital Stay: Payer: Self-pay | Admitting: Critical Care Medicine

## 2018-11-16 ENCOUNTER — Inpatient Hospital Stay (INDEPENDENT_AMBULATORY_CARE_PROVIDER_SITE_OTHER): Payer: Medicare Other | Admitting: Physician Assistant

## 2018-11-21 ENCOUNTER — Encounter (INDEPENDENT_AMBULATORY_CARE_PROVIDER_SITE_OTHER): Payer: Self-pay | Admitting: Family

## 2018-11-21 ENCOUNTER — Other Ambulatory Visit: Payer: Self-pay

## 2018-11-21 ENCOUNTER — Ambulatory Visit (INDEPENDENT_AMBULATORY_CARE_PROVIDER_SITE_OTHER): Payer: Medicare Other | Admitting: Family

## 2018-11-21 VITALS — Ht 76.0 in | Wt 260.0 lb

## 2018-11-21 DIAGNOSIS — Z89432 Acquired absence of left foot: Secondary | ICD-10-CM

## 2018-11-21 NOTE — Progress Notes (Signed)
Post-Op Visit Note   Patient: Cory Palmer           Date of Birth: 1961/03/22           MRN: [id number] Visit Date: 11/21/2018 PCP: Patient, [name] Per  Chief Complaint:  Chief Complaint  Patient presents with  . Left Foot - Routine Post Op    09/28/2018 left foot TMA    HPI:  HPI The patient is a 58 year old gentleman who presents today status post left foot transmetatarsal amputation February 28.  He is currently at skilled nursing for rehab will be discharging home today.  Has discontinued his doxycycline.  He has been weightbearing with a cane walking in a fracture boot full weightbearing.  Sutures do remain in place.  He states he did have thousand steps with physical therapy yesterday and feels comfortable ambulating at home.  He already has an order for a custom orthotic and extra-depth shoe to [employer]. Ortho Exam Incision is well-healed there is no surrounding erythema no odor mild edema.  No sign of infection.  Sutures harvested today.  Visit Diagnoses:  1. S/P transmetatarsal amputation of foot, left (HCC)     Plan: Continue with daily incisional cleansing and dry dressings.  May advance weightbearing in regular shoewear as tolerated.  Provided a postop shoe today.  Continue with a cane.  He will discharge home and follow-up 1 more time in 2 weeks.  Follow-Up Instructions: Return in about 2 weeks (around 12/05/2018).   Imaging: No results found.  Orders:  No orders of the defined types were placed in this encounter.  No orders of the defined types were placed in this encounter.    PMFS History: Patient Active Problem List   Diagnosis Date Noted  . Serum total bilirubin elevated 10/29/2018  . BPH (benign prostatic hyperplasia) 10/29/2018  . Non-pressure chronic ulcer of other part of left foot limited to breakdown of skin (HCC)   . Below-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)   . Osteomyelitis (HCC) 10/28/2018  . Hx of BKA, right (HCC)   . PAF  (paroxysmal atrial fibrillation) (HCC)   . Bacteremia due to Gram-positive bacteria   . Hypokalemia   . Hypomagnesemia   . Sepsis without acute organ dysfunction (HCC) 10/05/2018  . Depressed bipolar disorder (HCC) 10/05/2018  . Hypertension 10/05/2018  . Hyperbilirubinemia 10/05/2018  . S/P transmetatarsal amputation of foot, 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  . Osteomyelitis of toe of left foot (HCC)   . Wound infection 05/07/2018  . Homelessness 05/07/2018  . Diabetes mellitus type 2, noninsulin dependent (HCC) 05/07/2018  . Acute osteomyelitis involving lower leg, right (HCC) 05/07/2018   Past Medical History:  Diagnosis Date  . Anxiety   . Depressed bipolar disorder (HCC)   . Diabetes mellitus without complication (HCC)   . Hypertension     Family History  Problem Relation Age of Onset  . Hypertension Other   . Diabetes Mellitus II Other     Past Surgical History:  Procedure Laterality Date  . AMPUTATION Left 09/28/2018   Procedure: LEFT TRANSMETATARSAL AMPUTATION;  Surgeon: Nadara Mustarduda, Marcus V, MD;  Location[facility] OR;  Service: Orthopedics;  Laterality: Left;  . BELOW KNEE LEG AMPUTATION Right    Social History   Occupational History  . Not on file  Tobacco Use  . Smoking status: Current Every Day Smoker    Types: Cigarettes  . Smokeless tobacco: Never Used  Substance and Sexual Activity  . Alcohol use: Yes  Comment: occasional  . Drug use: Yes    Types: Cocaine  . Sexual activity: Not on file

## 2018-12-06 ENCOUNTER — Ambulatory Visit: Payer: Self-pay | Admitting: Orthopedic Surgery

## 2018-12-19 ENCOUNTER — Other Ambulatory Visit: Payer: Self-pay

## 2018-12-19 ENCOUNTER — Encounter (HOSPITAL_COMMUNITY): Payer: Self-pay | Admitting: *Deleted

## 2018-12-19 ENCOUNTER — Encounter: Payer: Self-pay | Admitting: Family

## 2018-12-19 ENCOUNTER — Ambulatory Visit (INDEPENDENT_AMBULATORY_CARE_PROVIDER_SITE_OTHER): Payer: Medicare Other | Admitting: Family

## 2018-12-19 ENCOUNTER — Emergency Department (HOSPITAL_COMMUNITY): Payer: Medicare Other

## 2018-12-19 ENCOUNTER — Emergency Department (HOSPITAL_COMMUNITY)
Admission: EM | Admit: 2018-12-19 | Discharge: 2018-12-19 | Disposition: A | Discharge status: Home / Self Care | Payer: Medicare Other | Attending: Emergency Medicine | Admitting: Emergency Medicine

## 2018-12-19 VITALS — Ht 76.0 in | Wt 260.0 lb

## 2018-12-19 DIAGNOSIS — Z79899 Other long term (current) drug therapy: Secondary | ICD-10-CM | POA: Insufficient documentation

## 2018-12-19 DIAGNOSIS — F319 Bipolar disorder, unspecified: Secondary | ICD-10-CM | POA: Insufficient documentation

## 2018-12-19 DIAGNOSIS — Z9889 Other specified postprocedural states: Secondary | ICD-10-CM | POA: Diagnosis not present

## 2018-12-19 DIAGNOSIS — I10 Essential (primary) hypertension: Secondary | ICD-10-CM | POA: Insufficient documentation

## 2018-12-19 DIAGNOSIS — M869 Osteomyelitis, unspecified: Secondary | ICD-10-CM

## 2018-12-19 DIAGNOSIS — F17210 Nicotine dependence, cigarettes, uncomplicated: Secondary | ICD-10-CM | POA: Diagnosis not present

## 2018-12-19 DIAGNOSIS — E119 Type 2 diabetes mellitus without complications: Secondary | ICD-10-CM | POA: Diagnosis not present

## 2018-12-19 DIAGNOSIS — M86272 Subacute osteomyelitis, left ankle and foot: Secondary | ICD-10-CM | POA: Insufficient documentation

## 2018-12-19 DIAGNOSIS — Z794 Long term (current) use of insulin: Secondary | ICD-10-CM | POA: Diagnosis not present

## 2018-12-19 DIAGNOSIS — R2241 Localized swelling, mass and lump, right lower limb: Secondary | ICD-10-CM | POA: Diagnosis present

## 2018-12-19 DIAGNOSIS — L97521 Non-pressure chronic ulcer of other part of left foot limited to breakdown of skin: Secondary | ICD-10-CM

## 2018-12-19 LAB — LACTIC ACID, PLASMA: Lactic Acid, Venous: 1 mmol/L (ref 0.5–1.9)

## 2018-12-19 LAB — CBC
HCT: 41.1 % (ref 39.0–52.0)
Hemoglobin: 13 g/dL (ref 13.0–17.0)
MCH: 31 pg (ref 26.0–34.0)
MCHC: 31.6 g/dL (ref 30.0–36.0)
MCV: 97.9 fL (ref 80.0–100.0)
Platelets: 95 10*3/uL — ABNORMAL LOW (ref 150–400)
RBC: 4.2 MIL/uL — ABNORMAL LOW (ref 4.22–5.81)
RDW: 14.5 % (ref 11.5–15.5)
WBC: 6.2 10*3/uL (ref 4.0–10.5)
nRBC: 0.3 % — ABNORMAL HIGH (ref 0.0–0.2)

## 2018-12-19 LAB — BASIC METABOLIC PANEL
Anion gap: 14 (ref 5–15)
BUN: 9 mg/dL (ref 6–20)
CO2: 16 mmol/L — ABNORMAL LOW (ref 22–32)
Calcium: 9.4 mg/dL (ref 8.9–10.3)
Chloride: 106 mmol/L (ref 98–111)
Creatinine, Ser: 0.81 mg/dL (ref 0.61–1.24)
GFR calc Af Amer: >60 mL/min (ref >60)
GFR calc non Af Amer: >60 mL/min (ref >60)
Glucose, Bld: 102 mg/dL — ABNORMAL HIGH (ref 70–99)
Potassium: 4.6 mmol/L (ref 3.5–5.1)
Sodium: 136 mmol/L (ref 135–145)

## 2018-12-19 MED ORDER — DOXYCYCLINE HYCLATE 100 MG PO CAPS: 100.0000 mg | ORAL_CAPSULE | Freq: Two times a day (BID) | ORAL | 0 refills | Status: AC

## 2018-12-19 MED ORDER — DOXYCYCLINE HYCLATE 100 MG PO TABS
100.0000 mg | ORAL_TABLET | Freq: Once | ORAL | Status: AC
  Administered 2018-12-19: 100 mg via ORAL
  Filled 2018-12-19: qty 1

## 2018-12-19 NOTE — ED Notes (Signed)
Pt states he has no where to go and will have to stay in lobby. Charge aware.

## 2018-12-19 NOTE — ED Notes (Signed)
Pt refusing to be DC. He states he is homeless and has no where to go. Spoke with Burna Mortimer CSM who came to speak with pt as well (she also spoke to him earlier in the afternoon) She informed him unfortunately she has no where for him to go, but will provide him with a taxi voucher to the [facility] so he can speak with their social workers in the AM. Pt supposed to follow up with Dr [name] in AM, voucher is to get pt from his office to [facility]. Pt requesting to stay here tonight until his appointment, informed him unfortunately I cannot allow that, if I make this exception for him to sleep here I would have to do this for everyone else, we are not a homeless shelter. Pt unhappy and states he will "be calling [employer]"

## 2018-12-19 NOTE — ED Notes (Signed)
Pt was seen today at Dr. Audrie Lia office by the nurse and she felt that she foot "looked fine" and he was given a prescription for a shoe for the foot.

## 2018-12-19 NOTE — ED Notes (Signed)
Patient transported to X-ray 

## 2018-12-19 NOTE — Progress Notes (Signed)
Office Visit Note   Patient: Cory Palmer           Date of Birth: 1960-12-07           MRN: [id number] Visit Date: 12/19/2018              Requested by: No referring provider defined for this encounter. PCP: Patient, [name] Per  Chief Complaint  Patient presents with  . Left Foot - Routine Post Op    09/28/18 left TMA      HPI: The patient is a 58 year old gentleman seen today for concern of wound with odor to the plantar aspect of left foot.  Ulcer under the ball of his foot.  States this is new since his surgery.  Of note he is status post left transmetatarsal amputation February 28 of this year.  This is been bleeding and painful.  The patient is currently having issues with finances and transportation.  Has completed a rehab stay following his transmetatarsal amputation and was discharged from his skilled nursing facility to a motel.  This stay has ended.  He [place] [facility] [place] [facility].  He is currently unhappy with the care he is getting from his prosthetists we have offered him appointments with a different company.  He feels he would benefit from a new prosthetic.  He is status post below the knee amputation on the right.  Also is in need of a custom orthotic for an extra-depth shoe on the left with accompanying carbon fiber plate.  Unclear whether he would benefit from a brace as well will need to get this wound healed up.  He complains of pain and rubbing in his prosthetic socket he feels this is not fitting well pain distally into the lateral aspect along the fibula.  No systemic symptoms.  Assessment & Plan: Visit Diagnoses:  1. Non-pressure chronic ulcer of other part of left foot limited to breakdown of skin (HCC)     Plan: Discussed with the patient feel that there is no sign or indication for antibiotics.  Will provide a tube of Iodosorb for his daily wound dressings.  To cleanse the ulcer daily with Dial soap  apply Iodosorb and a gauze dressing.  May use Band-Aids.  Did provide a Darco shoe as well to offload the forefoot.  Encouraged him to minimize his weightbearing and pressure to his forefoot.  Provided an order to [employer] for prosthetic evaluation for modifications for his socket on the right.  Also provided an order for an extra-depth shoe with a custom orthotic and carbon fiber plate on the left.  Follow-Up Instructions: Return in about 2 weeks (around 01/02/2019).   Ortho Exam  Patient is alert, oriented, no adenopathy, well-dressed, normal affect, normal respiratory effort. On examination of the left foot the incision is well-healed the transmetatarsal amputation is well-healed well consolidated there is trace edema to his foot.  No associated erythema however there is a plantar aspect to his forefoot this is 25 mm in diameter this is 1 mm deep there is 10% fibrinous tissue in the wound bed the wound is flat and pink.  There is no drainage today no odor no surrounding erythema no maceration.  Imaging: No results found. No images are attached to the encounter.  Labs: Lab Results  Component Value Date   HGBA1C 6.6 (H) 09/28/2018   HGBA1C 7.5 (H) 05/08/2018   ESRSEDRATE 33 (H) 10/28/2018  ESRSEDRATE 16 05/07/2018   CRP <0.8 10/28/2018   CRP <0.8 05/07/2018   REPTSTATUS 11/03/2018 FINAL 10/29/2018   REPTSTATUS 11/03/2018 FINAL 10/29/2018   CULT  10/29/2018    NO GROWTH 5 DAYS Performed at [facility], [address], [place], [place] [address]    CULT  10/29/2018    NO GROWTH 5 DAYS Performed at [facility], [address] [place], [place] [address]    [facility] STAPHYLOCOCCUS AUREUS 10/04/2018   LABORGA STREPTOCOCCUS GROUP G 10/04/2018     Lab Results  Component Value Date   ALBUMIN 2.9 (L) 10/30/2018   ALBUMIN 3.4 (L) 10/29/2018   ALBUMIN 3.5 10/28/2018   PREALBUMIN 11.2 (L) 05/08/2018    Body mass index is 31.65 kg/m.  Orders:  No orders of the defined  types were placed in this encounter.  No orders of the defined types were placed in this encounter.    Procedures: No procedures performed  Clinical Data: No additional findings.  ROS:  All other systems negative, except as noted in the HPI. Review of Systems  Constitutional: Negative for chills and fever.  Musculoskeletal: Positive for arthralgias and gait problem.  Skin: Positive for wound. Negative for color change.    Objective: Vital Signs: Ht 6\' 4"  (1.93 m)   Wt 260 lb (117.9 kg)   BMI 31.65 kg/m   Specialty Comments:  No specialty comments available.  PMFS History: Patient Active Problem List   Diagnosis Date Noted  . Serum total bilirubin elevated 10/29/2018  . BPH (benign prostatic hyperplasia) 10/29/2018  . Non-pressure chronic ulcer of other part of left foot limited to breakdown of skin (HCC)   . Below-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)   . Osteomyelitis (HCC) 10/28/2018  . Hx of BKA, right (HCC)   . PAF (paroxysmal atrial fibrillation) (HCC)   . Bacteremia due to Gram-positive bacteria   . Hypokalemia   . Hypomagnesemia   . Sepsis without acute organ dysfunction (HCC) 10/05/2018  . Depressed bipolar disorder (HCC) 10/05/2018  . Hypertension 10/05/2018  . Hyperbilirubinemia 10/05/2018  . S/P transmetatarsal amputation of foot, 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  . Osteomyelitis of toe of left foot (HCC)   . Wound infection 05/07/2018  . Homelessness 05/07/2018  . Diabetes mellitus type 2, noninsulin dependent (HCC) 05/07/2018  . Acute osteomyelitis involving lower leg, right (HCC) 05/07/2018   Past Medical History:  Diagnosis Date  . Anxiety   . Depressed bipolar disorder (HCC)   . Diabetes mellitus without complication (HCC)   . Hypertension     Family History  Problem Relation Age of Onset  . Hypertension Other   . Diabetes Mellitus II Other     Past Surgical History:  Procedure Laterality Date  . AMPUTATION Left 09/28/2018   Procedure:  LEFT TRANSMETATARSAL AMPUTATION;  Surgeon: Nadara Mustard, MD;  Location: [facility] OR;  Service: Orthopedics;  Laterality: Left;  . BELOW KNEE LEG AMPUTATION Right    Social History   Occupational History  . Not on file  Tobacco Use  . Smoking status: Current Every Day Smoker    Types: Cigarettes  . Smokeless tobacco: Never Used  Substance and Sexual Activity  . Alcohol use: Yes    Comment: occasional  . Drug use: Yes    Types: Cocaine  . Sexual activity: Not on file

## 2018-12-19 NOTE — ED Notes (Signed)
Pt discharged from ED; instructions provided and scripts given; Pt encouraged to return to ED if symptoms worsen and to f/u with PCP; Pt verbalized understanding of all instructions Pt refused to sign for discharge due to him saying he disagrees

## 2018-12-19 NOTE — ED Provider Notes (Signed)
[facility] EMERGENCY DEPARTMENT Provider Note   CSN: [id number] Arrival date & time: 12/19/18  1522    History   Chief Complaint Chief Complaint  Patient presents with  . Foot Swelling    HPI Cory Palmer is a 58 y.o. male.  With past medical history of DM, hypertension, bipolar disorder who presents to the ED with chief complaint of left foot swelling and pain.  Patient had a left midfoot amputation back in February.  Was admitted in March for concerns for osteomyelitis and discharged to a rehab facility.  Patient presents today with concern for swelling and bleeding from a ulcer to the bottom of his foot.  States over the past two weeks the swelling has progressed.     HPI  Past Medical History:  Diagnosis Date  . Anxiety   . Depressed bipolar disorder (HCC)   . Diabetes mellitus without complication (HCC)   . Hypertension     Patient Active Problem List   Diagnosis Date Noted  . Serum total bilirubin elevated 10/29/2018  . BPH (benign prostatic hyperplasia) 10/29/2018  . Non-pressure chronic ulcer of other part of left foot limited to breakdown of skin (HCC)   . Below-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)   . Osteomyelitis (HCC) 10/28/2018  . Hx of BKA, right (HCC)   . PAF (paroxysmal atrial fibrillation) (HCC)   . Bacteremia due to Gram-positive bacteria   . Hypokalemia   . Hypomagnesemia   . Sepsis without acute organ dysfunction (HCC) 10/05/2018  . Depressed bipolar disorder (HCC) 10/05/2018  . Hypertension 10/05/2018  . Hyperbilirubinemia 10/05/2018  . S/P transmetatarsal amputation of foot, 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  . Osteomyelitis of toe of left foot (HCC)   . Wound infection 05/07/2018  . Homelessness 05/07/2018  . Diabetes mellitus type 2, noninsulin dependent (HCC) 05/07/2018  . Acute osteomyelitis involving lower leg, right (HCC) 05/07/2018    Past Surgical History:  Procedure Laterality Date  . AMPUTATION Left 09/28/2018    Procedure: LEFT TRANSMETATARSAL AMPUTATION;  Surgeon: Nadara Mustard, MD;  Location: [facility] OR;  Service: Orthopedics;  Laterality: Left;  . BELOW KNEE LEG AMPUTATION Right         Home Medications    Prior to Admission medications   Medication Sig Start Date End Date Taking? Authorizing Provider  acetaminophen (TYLENOL) 325 MG tablet Take 2 tablets (650 mg total) by mouth every 6 (six) hours as needed for mild pain (or Fever >/= 101). 05/10/18  Yes Danford, Earl Lites, MD  feeding supplement, GLUCERNA SHAKE, (GLUCERNA SHAKE) LIQD Take 237 mLs by mouth 2 (two) times daily between meals. 05/10/18  Yes Danford, Earl Lites, MD  insulin aspart (NOVOLOG) 100 UNIT/ML injection Inject 0-9 Units into the skin 3 (three) times daily with meals. 11/02/18  Yes Shon Hale, MD  metFORMIN (GLUCOPHAGE) 1000 MG tablet Take 1 tablet (1,000 mg total) by mouth 2 (two) times daily with a meal. 08/28/18  Yes Ward, Layla Maw, DO  Multiple Vitamin (MULTIVITAMIN WITH MINERALS) TABS tablet Take 1 tablet by mouth daily. 05/11/18  Yes Danford, Earl Lites, MD  QUEtiapine (SEROQUEL) 200 MG tablet Take 1 tablet (200 mg total) by mouth at bedtime. 11/02/18  Yes Emokpae, Courage, MD  sertraline (ZOLOFT) 25 MG tablet Take 25 mg by mouth daily. 12/03/18  Yes [provider]  tamsulosin (FLOMAX) 0.4 MG CAPS capsule Take 0.4 mg by mouth daily. 12/03/18  Yes [provider]  traZODone (DESYREL) 50 MG tablet Take  1 tablet (50 mg total) by mouth at bedtime as needed for sleep. 11/02/18  Yes Emokpae, Courage, MD  doxycycline (VIBRAMYCIN) 100 MG capsule Take 1 capsule (100 mg total) by mouth 2 (two) times daily for 10 days. 12/19/18 12/29/18  Dicky DoeFord, Saurabh Hettich, MD  loratadine (CLARITIN) 10 MG tablet Take 1 tablet (10 mg total) by mouth daily as needed for allergies, rhinitis or itching. Patient not taking: Reported on 12/19/2018 11/02/18   Shon HaleEmokpae, Courage, MD    Family History Family History  Problem Relation Age of  Onset  . Hypertension Other   . Diabetes Mellitus II Other     Social History Social History   Tobacco Use  . Smoking status: Current Every Day Smoker    Types: Cigarettes  . Smokeless tobacco: Never Used  Substance Use Topics  . Alcohol use: Yes    Comment: occasional  . Drug use: Yes    Types: Cocaine     Allergies   Patient has no known allergies.   Review of Systems Review of Systems  Constitutional: Negative for chills and fever.  HENT: Negative for ear pain and sore throat.   Eyes: Negative for pain and visual disturbance.  Respiratory: Negative for cough and shortness of breath.   Cardiovascular: Negative for chest pain and palpitations.  Gastrointestinal: Negative for abdominal pain and vomiting.  Genitourinary: Negative for dysuria and hematuria.  Musculoskeletal: Negative for arthralgias and back pain.       Left foot pain and swelling  Skin: Negative for color change and rash.  Neurological: Negative for seizures, syncope and headaches.  All other systems reviewed and are negative.    Physical Exam Updated Vital Signs BP 123/88   Pulse (!) 55   Temp 97.8 F (36.6 C) (Oral)   Resp 16   Ht 6' 3.98" (1.93 m)   Wt 117.9 kg   SpO2 97%   BMI 31.66 kg/m   Physical Exam Vitals signs and nursing note reviewed.  Constitutional:      General: He is not in acute distress.    Appearance: Normal appearance. He is well-developed. He is not ill-appearing.  HENT:     Head: Normocephalic and atraumatic.  Eyes:     Conjunctiva/sclera: Conjunctivae normal.  Neck:     Musculoskeletal: Neck supple.  Cardiovascular:     Rate and Rhythm: Normal rate and regular rhythm.     Heart sounds: No murmur.  Pulmonary:     Effort: Pulmonary effort is normal. No respiratory distress.     Breath sounds: Normal breath sounds.  Abdominal:     Palpations: Abdomen is soft.     Tenderness: There is no abdominal tenderness.  Musculoskeletal:     Comments: Left midfoot  amputation. Surgical site appears intact without infection. He has a chronic ulcer to the bottom of his foot that appears clean. Uniform swelling to the foot.  Skin:    General: Skin is warm and dry.  Neurological:     Mental Status: He is alert.      ED Treatments / Results  Labs (all labs ordered are listed, but only abnormal results are displayed) Labs Reviewed  CBC - Abnormal; Notable for the following components:      Result Value   RBC 4.20 (*)    Platelets 95 (*)    nRBC 0.3 (*)    All other components within normal limits  BASIC METABOLIC PANEL - Abnormal; Notable for the following components:   CO2 16 (*)  Glucose, Bld 102 (*)    All other components within normal limits  LACTIC ACID, PLASMA    EKG None  Radiology Dg Foot 2 Views Left  Result Date: 12/19/2018 CLINICAL DATA:  Right foot swelling and pain EXAM: LEFT FOOT - 2 VIEW COMPARISON:  10/28/2018, 10/04/2018 FINDINGS: Transmetatarsal amputation of the left foot. There is periosteal new bone formation that has increased since the prior study at the distal aspect of the second through fifth metatarsals. There is a large amount of soft tissue swelling. Linear radiopacities within the soft tissues are unchanged. IMPRESSION: Progressive periosteal new bone formation at the transmetatarsal amputation sites of the second through fifth digits consistent with acute or chronic osteomyelitis. Electronically Signed   By: Deatra Robinson M.D.   On: 12/19/2018 20:21    Procedures Procedures (including critical care time)  Medications Ordered in ED Medications  doxycycline (VIBRA-TABS) tablet 100 mg (100 mg Oral Given 12/19/18 2303)     Initial Impression / Assessment and Plan / ED Course  I have reviewed the triage vital signs and the nursing notes.  Pertinent labs & imaging results that were available during my care of the patient were reviewed by me and considered in my medical decision making (see chart for details).         Cory Palmer is a 58 y.o. male.  With past medical history of DM, hypertension, bipolar disorder who presents to the ED with chief complaint of left foot swelling and pain.  On initial exam patient well appearing, not in acute distress. VSS, afebrile. Physical exam as above.  Labs and imaging obtained.  No white count, lactic normal. XR concerns for acute on chronic osteomyelitis.   Consulted orthopedics. Recommend starting patient on abx and following up with Dr. [name] tomorrow or Friday given that he is not septic. Agree with this plan. Discussed this with patient who agrees with plan. Patient given dose of doxycycline in ED and discharged with follow up instructions.    Final Clinical Impressions(s) / ED Diagnoses   Final diagnoses:  Osteomyelitis of left foot, unspecified type [facility])    ED Discharge Orders         Ordered    doxycycline (VIBRAMYCIN) 100 MG capsule  2 times daily     12/19/18 2238           Dicky Doe, MD 12/20/18 1201    Pricilla Loveless, MD 12/21/18 2220

## 2018-12-19 NOTE — ED Triage Notes (Signed)
Pt states that he had a 2 toes on left foot amputated in February and was in a rehab facility after this.  Pt is currently homeless and [facility].  Pt is here due to pain and swelling and bleeding of his left foot. Pt would like to get into a rehab for a few weeks until he can get a permanent place to live.  No fever or chills with this.

## 2018-12-21 NOTE — Progress Notes (Signed)
COVID Hotel Screening performed. Temperature, PHQ-9, and need for medical care and medications assessed. No additional needs assessed at this time.  Tiajuana Leppanen RN MSN 

## 2018-12-23 NOTE — Progress Notes (Signed)
COVID Hotel Screening performed. Temperature, PHQ-9, and need for medical care and medications assessed. Referral  to assist patient with obtaining medications. Referral made to Caroleen Hamman.  Carlyle Basques RN MSN

## 2018-12-25 ENCOUNTER — Telehealth: Payer: Self-pay | Admitting: *Deleted

## 2018-12-25 DIAGNOSIS — Z20822 Contact with and (suspected) exposure to covid-19: Secondary | ICD-10-CM

## 2018-12-25 NOTE — Telephone Encounter (Signed)
Order placed for COVID-19 testing.  

## 2018-12-28 NOTE — Progress Notes (Signed)
COVID Hotel Screening performed. Temperature, PHQ-9, and need for medical care and medications assessed. Patient agreed to the COVID-19 testing. No additional needs assessed at this time.  Ki Corbo RN MSN 

## 2018-12-29 ENCOUNTER — Telehealth: Payer: Self-pay

## 2018-12-29 NOTE — Telephone Encounter (Signed)
Pt. was potentially exposed to COVID 19, at recent [facility] Appt.  Noted that this pt. is being followed as an outpatient, and has been offered COVID 19 testing.

## 2019-01-02 ENCOUNTER — Other Ambulatory Visit: Payer: Self-pay

## 2019-01-02 ENCOUNTER — Ambulatory Visit (INDEPENDENT_AMBULATORY_CARE_PROVIDER_SITE_OTHER): Payer: Medicare Other | Admitting: Family

## 2019-01-02 ENCOUNTER — Encounter: Payer: Self-pay | Admitting: Family

## 2019-01-02 VITALS — Ht 75.0 in | Wt 260.0 lb

## 2019-01-02 DIAGNOSIS — L97521 Non-pressure chronic ulcer of other part of left foot limited to breakdown of skin: Secondary | ICD-10-CM | POA: Diagnosis not present

## 2019-01-02 DIAGNOSIS — Z89432 Acquired absence of left foot: Secondary | ICD-10-CM | POA: Diagnosis not present

## 2019-01-02 LAB — NOVEL CORONAVIRUS, NAA: SARS-CoV-2, NAA: NOT DETECTED

## 2019-01-02 NOTE — Progress Notes (Signed)
Office Visit Note   Patient: Cory Palmer           Date of Birth: 12-Aug-1960           MRN: [id number] Visit Date: 01/02/2019              Requested by: No referring provider defined for this encounter. PCP: Patient, [name] Per  Chief Complaint  Patient presents with  . Left Foot - Follow-up      HPI: The patient is a 58 year old gentleman seen today in routine follow up for wound to the plantar aspect of left foot.  Ulcer under the ball of his foot. Of note he is status post left transmetatarsal amputation February 28 of this year.  This is been bleeding and painful.  The patient is currently having issues with finances and transportation.   Full weight bearing in a Darco shoe. Silvadene dressings.  Has completed a rehab stay following his transmetatarsal amputation and was discharged from his skilled nursing facility to a motel.  This stay has ended. States he his now residing at the [facility].   No systemic symptoms.  Assessment & Plan: Visit Diagnoses:  No diagnosis found.  Plan: Discussed with the patient feel that there is no sign or indication for antibiotics. Continue with daily wound cleansing. Apply dressing daily, silvadene every other day. Darco for ambulation. Minimize weight bearing emphasized.  Follow-Up Instructions: No follow-ups on file.   Ortho Exam  Patient is alert, oriented, no adenopathy, well-dressed, normal affect, normal respiratory effort. On examination of the left foot the incision is well-healed the transmetatarsal amputation is well-healed well consolidated there is trace edema to his foot.  No associated erythema however there is an ulcer to plantar aspect to his forefoot this is 25 mm x 15 mm and 1 mm deep. Surrounding maceration. Wound bed is flat and pink. No drainage. No surrounding erythema. No cellulitis.  Imaging: No results found. No images are attached to the encounter.  Labs: Lab Results  Component Value Date   HGBA1C 6.6  (H) 09/28/2018   HGBA1C 7.5 (H) 05/08/2018   ESRSEDRATE 33 (H) 10/28/2018   ESRSEDRATE 16 05/07/2018   CRP <0.8 10/28/2018   CRP <0.8 05/07/2018   REPTSTATUS 11/03/2018 FINAL 10/29/2018   REPTSTATUS 11/03/2018 FINAL 10/29/2018   CULT  10/29/2018    NO GROWTH 5 DAYS Performed at [facility], [address] [place], [place] [address]    CULT  10/29/2018    NO GROWTH 5 DAYS Performed at [facility], [address] [place], [place] [address]    [facility] STAPHY[facility]US AUREUS 10/04/2018   LABORGA STREPTOCOCCUS GROUP G 10/04/2018     Lab Results  Component Value Date   ALBUMIN 2.9 (L) 10/30/2018   ALBUMIN 3.4 (L) 10/29/2018   ALBUMIN 3.5 10/28/2018   PREALBUMIN 11.2 (L) 05/08/2018    Body mass index is 32.5 kg/m.  Orders:  No orders of the defined types were placed in this encounter.  No orders of the defined types were placed in this encounter.    Procedures: No procedures performed  Clinical Data: No additional findings.  ROS:  All other systems negative, except as noted in the HPI. Review of Systems  Constitutional: Negative for chills and fever.  Musculoskeletal: Positive for arthralgias and gait problem.  Skin: Positive for wound. Negative for color change.    Objective: Vital Signs: Ht 6\' 3"  (1.905 m)   Wt 260  lb (117.9 kg)   BMI 32.50 kg/m   Specialty Comments:  No specialty comments available.  PMFS History: Patient Active Problem List   Diagnosis Date Noted  . Serum total bilirubin elevated 10/29/2018  . BPH (benign prostatic hyperplasia) 10/29/2018  . Non-pressure chronic ulcer of other part of left foot limited to breakdown of skin (HCC)   . Below-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)   . Osteomyelitis (HCC) 10/28/2018  . Hx of BKA, right (HCC)   . PAF (paroxysmal atrial fibrillation) (HCC)   . Bacteremia due to Gram-positive bacteria   . Hypokalemia   . Hypomagnesemia   . Sepsis without acute organ dysfunction  (HCC) 10/05/2018  . Depressed bipolar disorder (HCC) 10/05/2018  . Hypertension 10/05/2018  . Hyperbilirubinemia 10/05/2018  . S/P transmetatarsal amputation of foot, 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  . Osteomyelitis of toe of left foot (HCC)   . Wound infection 05/07/2018  . Homelessness 05/07/2018  . Diabetes mellitus type 2, noninsulin dependent (HCC) 05/07/2018  . Acute osteomyelitis involving lower leg, right (HCC) 05/07/2018   Past Medical History:  Diagnosis Date  . Anxiety   . Depressed bipolar disorder (HCC)   . Diabetes mellitus without complication (HCC)   . Hypertension     Family History  Problem Relation Age of Onset  . Hypertension Other   . Diabetes Mellitus II Other     Past Surgical History:  Procedure Laterality Date  . AMPUTATION Left 09/28/2018   Procedure: LEFT TRANSMETATARSAL AMPUTATION;  Surgeon: Nadara Mustard, MD;  Location: [facility] OR;  Service: Orthopedics;  Laterality: Left;  . BELOW KNEE LEG AMPUTATION Right    Social History   Occupational History  . Not on file  Tobacco Use  . Smoking status: Current Every Day Smoker    Types: Cigarettes  . Smokeless tobacco: Never Used  Substance and Sexual Activity  . Alcohol use: Yes    Comment: occasional  . Drug use: Yes    Types: Cocaine  . Sexual activity: Not on file

## 2019-01-03 ENCOUNTER — Telehealth: Payer: Self-pay

## 2019-01-03 ENCOUNTER — Encounter: Payer: Self-pay | Admitting: Hematology

## 2019-01-03 NOTE — Telephone Encounter (Signed)
Attempt was made to give patient's COVID results. Patient unavailable and will call back.

## 2019-01-03 NOTE — Progress Notes (Signed)
COVID Hotel Screening performed. Temperature, PHQ-9, and need for medical care and medications assessed. No additional needs assessed at this time.  Harjot Dibello RN MSN 

## 2019-01-17 ENCOUNTER — Telehealth: Payer: Self-pay

## 2019-01-17 NOTE — Telephone Encounter (Signed)
Voice mail message left via Drema Halon on 6/3, 6/12. 6/13 spoke with client and was made aware that Cory Palmer is being followed at [facility].

## 2019-01-18 NOTE — Progress Notes (Signed)
COVID Hotel Screening performed. COVID screening, temperature, PHQ-9, and need for medical care and medications assessed. No additional needs assessed at this time.  Apolo Cutshaw RN MSN 

## 2019-01-27 NOTE — Progress Notes (Signed)
COVID Hotel Screening performed. Temperature, PHQ-9, and need for medical care and medications assessed. No additional needs assessed at this time.  Bradleigh Sonnen  MSN, RN 

## 2019-02-06 ENCOUNTER — Ambulatory Visit: Payer: Medicare Other | Admitting: Family

## 2019-02-07 ENCOUNTER — Other Ambulatory Visit: Payer: Self-pay | Admitting: *Deleted

## 2019-02-07 DIAGNOSIS — Z20822 Contact with and (suspected) exposure to covid-19: Secondary | ICD-10-CM

## 2019-02-10 NOTE — Progress Notes (Signed)
[place] Screening performed. Temperature, PHQ-9 and medications assessment. Patient reports being on several medications that are about to run out and he does not have access to obtain additional medications. Pt needs transportation assistance for medical appts.   Jobe Igo MSN, RN

## 2019-02-14 LAB — NOVEL CORONAVIRUS, NAA: SARS-CoV-2, NAA: NOT DETECTED

## 2019-03-12 ENCOUNTER — Ambulatory Visit (INDEPENDENT_AMBULATORY_CARE_PROVIDER_SITE_OTHER): Payer: Medicare Other | Admitting: Family

## 2019-03-12 ENCOUNTER — Encounter: Payer: Self-pay | Admitting: Family

## 2019-03-12 ENCOUNTER — Other Ambulatory Visit: Payer: Self-pay

## 2019-03-12 ENCOUNTER — Ambulatory Visit: Payer: Medicare Other | Admitting: Family

## 2019-03-12 DIAGNOSIS — Z89511 Acquired absence of right leg below knee: Secondary | ICD-10-CM

## 2019-03-12 DIAGNOSIS — L97521 Non-pressure chronic ulcer of other part of left foot limited to breakdown of skin: Secondary | ICD-10-CM

## 2019-03-12 DIAGNOSIS — Z89432 Acquired absence of left foot: Secondary | ICD-10-CM

## 2019-03-12 NOTE — Progress Notes (Signed)
Office Visit Note   Patient: Cory Palmer           Date of Birth: 11-Jun-1961           MRN: [id number] Visit Date: 03/12/2019              Requested by: No referring provider defined for this encounter. PCP: Patient, [name] Per  No chief complaint on file.     HPI: The patient is a 58 year old gentleman seen today in follow-up for wound to the plantar aspect of his left foot.  The ulcer is to the ball of his foot.  He is also status post left transmetatarsal amputation this year.    He is full weightbearing in a regular shoe today.  Doing Silvadene dressing changes.    The patient is currently having issues with finances and transportation. completed a rehab stay following his transmetatarsal amputation and was discharged from his skilled nursing facility to a motel.  This stay has ended. States he his now residing at the [facility].   No systemic symptoms.  Assessment & Plan: Visit Diagnoses:  No diagnosis found.  Plan: I discussed wound care with the patient.  Encouraged him to continue cleansing this daily.  Applying a Silvadene dressing with a small amount of Silvadene.  Daily.  Discussed the importance of nonweightbearing.   Follow-Up Instructions: No follow-ups on file.   Ortho Exam  Patient is alert, oriented, no adenopathy, well-dressed, normal affect, normal respiratory effort. On examination of the left foot the incision is well-healed the transmetatarsal amputation is well-healed well consolidated there is trace edema to his foot.  No associated erythema.  Does have an ulcer to the plantar aspect of his foot this is beneath metatarsal distally, medial aspect this is 2 cm in diameter with 2 mm of depth there is flat pink tissue in the wound bed no active drainage there is some surrounding maceration no erythema. No cellulitis.  Imaging: No results found. No images are attached to the encounter.  Labs: Lab Results  Component Value Date   HGBA1C 6.6 (H)  09/28/2018   HGBA1C 7.5 (H) 05/08/2018   ESRSEDRATE 33 (H) 10/28/2018   ESRSEDRATE 16 05/07/2018   CRP <0.8 10/28/2018   CRP <0.8 05/07/2018   REPTSTATUS 11/03/2018 FINAL 10/29/2018   REPTSTATUS 11/03/2018 FINAL 10/29/2018   CULT  10/29/2018    NO GROWTH 5 DAYS Performed at [facility], [address] [place], [place] [address]    CULT  10/29/2018    NO GROWTH 5 DAYS Performed at [facility], [address] [place], [place] [address]    [facility] 10/04/2018   LABORGA STREPTOCOCCUS GROUP G 10/04/2018     Lab Results  Component Value Date   ALBUMIN 2.9 (L) 10/30/2018   ALBUMIN 3.4 (L) 10/29/2018   ALBUMIN 3.5 10/28/2018   PREALBUMIN 11.2 (L) 05/08/2018    There is no height or weight on file to calculate BMI.  Orders:  No orders of the defined types were placed in this encounter.  No orders of the defined types were placed in this encounter.    Procedures: No procedures performed  Clinical Data: No additional findings.  ROS:  All other systems negative, except as noted in the HPI. Review of Systems  Constitutional: Negative for chills and fever.  Musculoskeletal: Positive for arthralgias and gait problem.  Skin: Positive for wound. Negative for color change.    Objective: Vital Signs: There  were no vitals taken for this visit.  Specialty Comments:  No specialty comments available.  PMFS History: Patient Active Problem List   Diagnosis Date Noted  . Serum total bilirubin elevated 10/29/2018  . BPH (benign prostatic hyperplasia) 10/29/2018  . Non-pressure chronic ulcer of other part of left foot limited to breakdown of skin ([place])   . Below-knee amputation of right lower extremity ([place])   . Hx of BKA, right ([place])   . PAF (paroxysmal atrial fibrillation) ([place])   . Bacteremia due to Gram-positive bacteria   . Hypokalemia   . Hypomagnesemia   . Sepsis without acute organ dysfunction ([place]) 10/05/2018  . Depressed  bipolar disorder ([place]) 10/05/2018  . Hypertension 10/05/2018  . Hyperbilirubinemia 10/05/2018  . S/P transmetatarsal amputation of foot, left ([place]) 09/28/2018  . Wound infection 05/07/2018  . Homelessness 05/07/2018  . Diabetes mellitus type 2, noninsulin dependent ([place]) 05/07/2018  . Acute osteomyelitis involving lower leg, right ([place]) 05/07/2018   Past Medical History:  Diagnosis Date  . Anxiety   . Depressed bipolar disorder ([place])   . Diabetes mellitus without complication ([place])   . Hypertension   . Osteomyelitis of toe of left foot (HCC)     Family History  Problem Relation Age of Onset  . Hypertension Other   . Diabetes Mellitus II Other     Past Surgical History:  Procedure Laterality Date  . AMPUTATION Left 09/28/2018   Procedure: LEFT TRANSMETATARSAL AMPUTATION;  Surgeon: Newt Minion, MD;  Location: [place];  Service: Orthopedics;  Laterality: Left;  . BELOW KNEE LEG AMPUTATION Right    Social History   Occupational History  . Not on file  Tobacco Use  . Smoking status: Current Every Day Smoker    Types: Cigarettes  . Smokeless tobacco: Never Used  Substance and Sexual Activity  . Alcohol use: Yes    Comment: occasional  . Drug use: Yes    Types: Cocaine  . Sexual activity: Not on file

## 2019-03-26 NOTE — Progress Notes (Signed)
COVID-19 Screening performed. Temperature, PHQ-9, and need for medical care and medications assessed. No additional needs assessed at this time.  Jaqwan Wieber MSN, RN 

## 2019-03-27 NOTE — Progress Notes (Signed)
COVID-19 Screening performed. Temperature, PHQ-9, and need for medical care and medications assessed. Pt advised to reschedule [facility] appt. No additional needs assessed at this time.  Jobe Igo MSN, RN

## 2019-03-29 NOTE — Progress Notes (Signed)
COVID-19 Screening performed. Temperature, PHQ-9, and need for medical care and medications assessed. No additional needs assessed at this time.  Jontue Crumpacker MSN, RN 

## 2019-03-31 NOTE — Progress Notes (Signed)
COVID-19 Screening performed. Temperature, PHQ-9, and need for medical care and medications assessed. Pt referred for [facility] assistance.  No additional needs assessed at this time.  Jobe Igo MSN, RN

## 2019-03-31 NOTE — Progress Notes (Signed)
COVID-19 Screening performed. Temperature, PHQ-9, and need for medical care and medications assessed. No additional needs assessed at this time.  Meliya Mcconahy MSN, RN 

## 2019-04-21 ENCOUNTER — Other Ambulatory Visit: Payer: Self-pay

## 2019-04-21 ENCOUNTER — Ambulatory Visit (HOSPITAL_COMMUNITY)
Admission: EM | Admit: 2019-04-21 | Discharge: 2019-04-21 | Disposition: A | Discharge status: Home / Self Care | Payer: Medicare Other | Attending: Family Medicine | Admitting: Family Medicine

## 2019-04-21 ENCOUNTER — Encounter (HOSPITAL_COMMUNITY): Payer: Self-pay | Admitting: Emergency Medicine

## 2019-04-21 DIAGNOSIS — S91302A Unspecified open wound, left foot, initial encounter: Secondary | ICD-10-CM

## 2019-04-21 MED ORDER — SILVER SULFADIAZINE 1 % EX CREA
TOPICAL_CREAM | Freq: Every day | CUTANEOUS | Status: DC
  Administered 2019-04-21: 12:00:00 via TOPICAL

## 2019-04-21 MED ORDER — SILVER SULFADIAZINE 1 % EX CREA
TOPICAL_CREAM | CUTANEOUS | Status: AC
  Filled 2019-04-21: qty 85

## 2019-04-21 MED ORDER — TRAMADOL HCL 50 MG PO TABS: 50.0000 mg | ORAL_TABLET | Freq: Four times a day (QID) | ORAL | 0 refills | Status: AC | PRN

## 2019-04-21 NOTE — Discharge Instructions (Addendum)
Wash with Phisoderm twice a day. You can get this OTC Use the silvadene cream and keep covered.  You need to see wound specialist.  Contact put on your discharge instruction.  You can have the [facility] help you with that.  Follow up as needed for continued or worsening symptoms

## 2019-04-21 NOTE — ED Provider Notes (Signed)
[place]    CSN: [id number] Arrival date & time: 04/21/19  1052      History   Chief Complaint Chief Complaint  Patient presents with  . Wound Check    HPI Cory Palmer is a 58 y.o. male.   Patient is a 58 year old male with past medical history of anxiety, bipolar, diabetes, hypertension, chronic osteomyelitis of the left foot, BKA of the right, proximal A. Fib, sepsis.  Was currently homeless but has a new place of his own at this time.  He is presenting today with chronic foot ulcer to the bottom the left foot.  Reports that he has worsened and bleeding.  Very painful to walk on.  No increased redness, swelling or purulent drainage.  No fevers. He has been wrapping with gauze.   ROS per HPI      Past Medical History:  Diagnosis Date  . Anxiety   . Depressed bipolar disorder ([place])   . Diabetes mellitus without complication ([place])   . Hypertension   . Osteomyelitis of toe of left foot [facility])     Patient Active Problem List   Diagnosis Date Noted  . Serum total bilirubin elevated 10/29/2018  . BPH (benign prostatic hyperplasia) 10/29/2018  . Non-pressure chronic ulcer of other part of left foot limited to breakdown of skin ([place])   . Below-knee amputation of right lower extremity ([place])   . Hx of BKA, right ([place])   . PAF (paroxysmal atrial fibrillation) ([place])   . Bacteremia due to Gram-positive bacteria   . Hypokalemia   . Hypomagnesemia   . Sepsis without acute organ dysfunction ([place]) 10/05/2018  . Depressed bipolar disorder ([place]) 10/05/2018  . Hypertension 10/05/2018  . Hyperbilirubinemia 10/05/2018  . S/P transmetatarsal amputation of foot, left ([place]) 09/28/2018  . Wound infection 05/07/2018  . Homelessness 05/07/2018  . Diabetes mellitus type 2, noninsulin dependent ([place]) 05/07/2018  . Acute osteomyelitis involving lower leg, right ([place]) 05/07/2018    Past Surgical History:  Procedure Laterality Date  . AMPUTATION Left 09/28/2018   Procedure:  LEFT TRANSMETATARSAL AMPUTATION;  Surgeon: Newt Minion, MD;  Location: [place];  Service: Orthopedics;  Laterality: Left;  . BELOW KNEE LEG AMPUTATION Right        Home Medications    Prior to Admission medications   Medication Sig Start Date End Date Taking? Authorizing Provider  acetaminophen (TYLENOL) 325 MG tablet Take 2 tablets (650 mg total) by mouth every 6 (six) hours as needed for mild pain (or Fever >/= 101). 05/10/18   Danford, Suann Larry, MD  feeding supplement, GLUCERNA SHAKE, (GLUCERNA SHAKE) LIQD Take 237 mLs by mouth 2 (two) times daily between meals. 05/10/18   Danford, Suann Larry, MD  insulin aspart (NOVOLOG) 100 UNIT/ML injection Inject 0-9 Units into the skin 3 (three) times daily with meals. 11/02/18   Roxan Hockey, MD  loratadine (CLARITIN) 10 MG tablet Take 1 tablet (10 mg total) by mouth daily as needed for allergies, rhinitis or itching. 11/02/18   Roxan Hockey, MD  metFORMIN (GLUCOPHAGE) 1000 MG tablet Take 1 tablet (1,000 mg total) by mouth 2 (two) times daily with a meal. 08/28/18   Ward, Delice Bison, DO  Multiple Vitamin (MULTIVITAMIN WITH MINERALS) TABS tablet Take 1 tablet by mouth daily. 05/11/18   Danford, Suann Larry, MD  QUEtiapine (SEROQUEL) 200 MG tablet Take 1 tablet (200 mg total) by mouth at bedtime. 11/02/18   Roxan Hockey, MD  sertraline (ZOLOFT) 25 MG tablet Take 25  mg by mouth daily. 12/03/18   [provider]  tamsulosin (FLOMAX) 0.4 MG CAPS capsule Take 0.4 mg by mouth daily. 12/03/18   [provider]  traMADol (ULTRAM) 50 MG tablet Take 1 tablet (50 mg total) by mouth every 6 (six) hours as needed for up to 5 days. 04/21/19 04/26/19  Dahlia Byes A, NP  traZODone (DESYREL) 50 MG tablet Take 1 tablet (50 mg total) by mouth at bedtime as needed for sleep. 11/02/18   Shon Hale, MD    Family History Family History  Problem Relation Age of Onset  . Hypertension Other   . Diabetes Mellitus II Other     Social History  Social History   Tobacco Use  . Smoking status: Current Every Day Smoker    Types: Cigarettes  . Smokeless tobacco: Never Used  Substance Use Topics  . Alcohol use: Yes    Comment: occasional  . Drug use: Yes    Types: Cocaine     Allergies   Patient has no known allergies.   Review of Systems Review of Systems   Physical Exam Triage Vital Signs ED Triage Vitals  Enc Vitals Group     BP 04/21/19 1121 (!) 148/103     Pulse Rate 04/21/19 1121 74     Resp 04/21/19 1121 18     Temp 04/21/19 1121 (!) 97.1 F (36.2 C)     Temp Source 04/21/19 1121 Temporal     SpO2 04/21/19 1121 97 %     Weight --      Height --      Head Circumference --      Peak Flow --      Pain Score 04/21/19 1122 8     Pain Loc --      Pain Edu? --      Excl. in GC? --    No data found.  Updated Vital Signs BP (!) 148/103 (BP Location: Right Arm)   Pulse 74   Temp (!) 97.1 F (36.2 C) (Temporal)   Resp 18   SpO2 97%   Visual Acuity Right Eye Distance:   Left Eye Distance:   Bilateral Distance:    Right Eye Near:   Left Eye Near:    Bilateral Near:     Physical Exam Vitals signs and nursing note reviewed.  Constitutional:      Appearance: Normal appearance.     Comments: Very pleasant.   HENT:     Head: Normocephalic and atraumatic.     Nose: Nose normal.  Eyes:     Conjunctiva/sclera: Conjunctivae normal.  Neck:     Musculoskeletal: Normal range of motion.  Pulmonary:     Effort: Pulmonary effort is normal.  Musculoskeletal: Normal range of motion.     Comments: See picture for detail   Skin:    General: Skin is warm and dry.  Neurological:     Mental Status: He is alert.  Psychiatric:        Mood and Affect: Mood normal.          UC Treatments / Results  Labs (all labs ordered are listed, but only abnormal results are displayed) Labs Reviewed - No data to display  EKG   Radiology No results found.  Procedures Procedures (including critical care  time)  Medications Ordered in UC Medications  silver sulfADIAZINE (SILVADENE) 1 % cream ( Topical Given 04/21/19 1207)  silver sulfADIAZINE (SILVADENE) 1 % cream (has no administration in time range)  Initial Impression / Assessment and Plan / UC Course  I have reviewed the triage vital signs and the nursing notes.  Pertinent labs & imaging results that were available during my care of the patient were reviewed by me and considered in my medical decision making (see chart for details).     Chronic foot ulcer- will have him wash with gentle soap twice  daily. And keep wrapped. Using silvadene for a few days. Stay off the foot.  No need for oral abx. He needs a wound care consult. He is going to call his regular doctor tomorrow about this.  tramadol for pain.  Currently not homeless anymore but currently has no transportation and is hoping for that soon.   Final Clinical Impressions(s) / UC Diagnoses   Final diagnoses:  Open wound of left foot with complication, initial encounter     Discharge Instructions     Wash with Phisoderm twice a day. You can get this OTC Use the silvadene cream and keep covered.  You need to see wound specialist.  Contact put on your discharge instruction.  [facility] with that.  Follow up as needed for continued or worsening symptoms     ED Prescriptions    Medication Sig Dispense Auth. Provider   traMADol (ULTRAM) 50 MG tablet Take 1 tablet (50 mg total) by mouth every 6 (six) hours as needed for up to 5 days. 15 tablet Mckinleigh Schuchart A, NP     I have reviewed the PDMP during this encounter.   Janace ArisBast, Lakeysha Slutsky A, NP 04/21/19 1224

## 2019-04-21 NOTE — ED Triage Notes (Signed)
Pt here for wound check to left foot; pt sts hx of ulcerations to foot with amputation of toes; pt sts increased pain into left leg; pt is right BKA

## 2019-04-22 NOTE — Progress Notes (Signed)
COVID-19 Screening performed. Temperature, PHQ-9, and need for medical care and medications assessed. Pt advised to contact his [facility] provider.  Jobe Igo MSN, RN

## 2019-04-23 NOTE — Progress Notes (Signed)
COVID-19 Screening performed. Temperature, PHQ-9, and need for medical care and medications assessed. No additional needs assessed at this time.  Darreld Hoffer MSN, RN 

## 2019-05-09 ENCOUNTER — Other Ambulatory Visit: Payer: Self-pay

## 2019-05-09 ENCOUNTER — Encounter (HOSPITAL_BASED_OUTPATIENT_CLINIC_OR_DEPARTMENT_OTHER): Payer: Medicare Other | Attending: Internal Medicine | Admitting: Internal Medicine

## 2019-05-09 DIAGNOSIS — E1142 Type 2 diabetes mellitus with diabetic polyneuropathy: Secondary | ICD-10-CM | POA: Insufficient documentation

## 2019-05-09 DIAGNOSIS — I110 Hypertensive heart disease with heart failure: Secondary | ICD-10-CM | POA: Diagnosis not present

## 2019-05-09 DIAGNOSIS — Z89511 Acquired absence of right leg below knee: Secondary | ICD-10-CM | POA: Insufficient documentation

## 2019-05-09 DIAGNOSIS — E11621 Type 2 diabetes mellitus with foot ulcer: Secondary | ICD-10-CM | POA: Insufficient documentation

## 2019-05-09 DIAGNOSIS — E1169 Type 2 diabetes mellitus with other specified complication: Secondary | ICD-10-CM | POA: Diagnosis not present

## 2019-05-09 DIAGNOSIS — I509 Heart failure, unspecified: Secondary | ICD-10-CM | POA: Diagnosis not present

## 2019-05-09 DIAGNOSIS — M86672 Other chronic osteomyelitis, left ankle and foot: Secondary | ICD-10-CM | POA: Diagnosis not present

## 2019-05-16 ENCOUNTER — Other Ambulatory Visit: Payer: Self-pay

## 2019-05-16 ENCOUNTER — Encounter (HOSPITAL_BASED_OUTPATIENT_CLINIC_OR_DEPARTMENT_OTHER): Payer: Medicare Other | Admitting: Internal Medicine

## 2019-05-16 DIAGNOSIS — E11621 Type 2 diabetes mellitus with foot ulcer: Secondary | ICD-10-CM | POA: Diagnosis not present

## 2019-05-17 NOTE — Progress Notes (Signed)
NILS, THOR ([id number]) Visit Report for 05/16/2019 HPI Details Patient Name: Date of Service: Cory Palmer, Cory Palmer 05/16/2019 8:15 AM Medical Record [id number] Patient Account Number: [id number] Date of Birth/Sex: Treating RN: [birth year] (58 y.o. Cory Palmer Primary Care Provider: PATIENT, NO Other Clinician: Referring Provider: Treating Provider/Extender:Jood Retana, [name] in Treatment: 1 History of Present Illness HPI Description: ADMISSION 05/09/2019 This is a 58 year old man with type 2 diabetes. He has had a previous right BKA done in [place] 4 years ago. Looking back through [place] link this year he had a left fourth toe wound in January 2020. By looking at the pictures he had also had previous amputation of the first second and third toes as well as a right BKA. 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 [name] on 09/28/2018. I believe at some point he dehisced the amputation as he had a wound VAC in early March. He was admitted to hospital from 10/04/2018 through 10/18/2018 with MSSA/strep sepsis. A TEE was negative. I do not believe during this admission he was felt to have osteomyelitis however an x-ray on 10/28/2018 showed concern for osteomyelitis at the remaining second third and fourth metatarsals. An x-ray on 12/19/2018 showed progressive new bone formation at the transmetatarsal amputation sites of the second through fifth digits consistent with either acute or chronic osteomyelitis. It is hard to tell although after the discharge in March it looks as though he had a course of vancomycin and may be oral antibiotics although I am not sure about this or the duration of antibiotic therapy. The patient states that the wound opened up several months ago. It was clear that there was a wound on the plantar left foot on 12/27/2018. He was in the ER on 9/20 with a large wound on the left plantar foot given Silvadene. The patient states he  has been putting bizarre combinations of things on this wound including castor oil. Past medical history; congestive heart failure, type 2 diabetes, bipolar disorder, right BKA, remote history of substance abuse, atrial fibrillation Arterial studies done on the left and 05/06/2018 showed an ABI of 1.21 with triphasic waveforms. 10/15; I did talk with Dr. Hal Hope and asked her to order a CRP and sedimentation rate. Based on the lab work that she is doing inflammatory markers white count etc. we will order an MRI of the left foot [see discussion above]. She actually stated that she has not really been following him regularly so was not really aware of what had been done in March. His wound actually looks somewhat better today and slightly smaller in terms of surface area Electronic Signature(s) Signed: 05/16/2019 5:35:18 PM By: Baltazar Najjar Cory Palmer Entered By: Baltazar Najjar on 05/16/2019 09:26:07 -------------------------------------------------------------------------------- Physical Exam Details Patient Name: Date of Service: Cory Palmer, Cory Palmer 05/16/2019 8:15 AM Medical Record [id number] Patient Account Number: [id number] Date of Birth/Sex: Treating RN: 1960-11-10 (58 y.o. Cory Palmer Primary Care Provider: PATIENT, NO Other Clinician: Referring Provider: Treating Provider/Extender:Jayd Cadieux, [name] in Treatment: 1 Constitutional Sitting or standing Blood Pressure is within target range for patient.. Pulse regular and within target range for patient.Marland Kitchen Respirations regular, non-labored and within target range.. Temperature is normal and within the target range for the patient.Marland Kitchen Appears in no distress. Eyes Conjunctivae clear. No discharge.no icterus. Cardiovascular Pedal pulses palpable on the left. Lymphatic None palpable in the popliteal. Musculoskeletal Left TMA. Integumentary (Hair, Skin) No erythema around the wound. Notes Wound exam; the wound is measuring  slightly smaller. Surface  looked quite viable. Under illumination there is a very thin layer of surface debris but no mechanical debridement today. There is no evidence of infection Electronic Signature(s) Signed: 05/16/2019 5:35:18 PM By: [name] Cory Palmer Entered By: [name] on 05/16/2019 09:28:00 -------------------------------------------------------------------------------- Physician Orders Details Patient Name: Date of Service: Cory Palmer, Cory Palmer 05/16/2019 8:15 AM Medical Record [id number] Patient Account Number: [id number] Date of Birth/Sex: Treating RN: 12/23/1960 (58 y.o. Cory Palmer Primary Care Provider: PATIENT, NO Other Clinician: Referring Provider: Treating Provider/Extender:Wilmarie Sparlin, [name] in Treatment: 1 Verbal / Phone Orders: No Diagnosis Coding ICD-10 Coding Code Description E11.621 Type 2 diabetes mellitus with foot ulcer M86.672 Other chronic osteomyelitis, left ankle and foot E11.42 Type 2 diabetes mellitus with diabetic polyneuropathy Z89.511 Acquired absence of right leg below knee Follow-up Appointments Return Appointment in 1 week. Dressing Change Frequency Wound #1 Left,Plantar Foot Change Dressing every other day. Skin Barriers/Peri-Wound Care Moisturizing lotion - to left leg daily. Wound Cleansing Wound #1 Left,Plantar Foot May shower and wash wound with soap and water. - with dressing changes. Primary Wound Dressing Wound #1 Left,Plantar Foot Calcium Alginate with Silver Secondary Dressing Wound #1 Left,Plantar Foot Kerlix/Rolled Gauze Dry Gauze Other: - felt callous pad Edema Control Avoid standing for long periods of time Elevate legs to the level of the heart or above for 30 minutes daily and/or when sitting, a frequency of: - throughout the day. Additional Orders / Instructions Stop/Decrease Smoking Follow Nutritious Diet - increase protein and vegetables in diabetic diet. Electronic Signature(s) Signed:  05/16/2019 5:35:18 PM By: [name] Cory Palmer Signed: 05/17/2019 6:00:55 PM By: Levan Hurst RN, BSN Entered By: Levan Hurst on 05/16/2019 09:16:34 -------------------------------------------------------------------------------- Problem List Details Patient Name: Date of Service: Cory Palmer, Cory Palmer 05/16/2019 8:15 AM Medical Record [id number] Patient Account Number: [id number] Date of Birth/Sex: Treating RN: [birth year] (58 y.o. Cory Palmer Primary Care Provider: PATIENT, NO Other Clinician: Referring Provider: Treating Provider/Extender:Shanaye Rief, [name] in Treatment: 1 Active Problems ICD-10 Evaluated Encounter Code Description Active Date Today Diagnosis E11.621 Type 2 diabetes mellitus with foot ulcer 05/09/2019 No Yes M86.672 Other chronic osteomyelitis, left ankle and foot 05/09/2019 No Yes E11.42 Type 2 diabetes mellitus with diabetic polyneuropathy 05/09/2019 No Yes Z89.511 Acquired absence of right leg below knee 05/09/2019 No Yes Inactive Problems Resolved Problems Electronic Signature(s) Signed: 05/16/2019 5:35:18 PM By: [name] Cory Palmer Entered By: [name] on 05/16/2019 09:24:13 -------------------------------------------------------------------------------- Progress Note Details Patient Name: Date of Service: Cory Palmer 05/16/2019 8:15 AM Medical Record [id number] Patient Account Number: [id number] Date of Birth/Sex: Treating RN: 10-01-1960 (58 y.o. Cory Palmer Primary Care Provider: PATIENT, NO Other Clinician: Referring Provider: Treating Provider/Extender:Nakema Fake, [name] in Treatment: 1 Subjective History of Present Illness (HPI) ADMISSION 05/09/2019 This is a 58 year old man with type 2 diabetes. He has had a previous right BKA done in [place] 4 years ago. Looking back through  link this year he had a left fourth toe wound in January 2020. By looking at the pictures he had also had previous  amputation of the first second and third toes as well as a right BKA. 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 Sharol Given on 09/28/2018. I believe at some point he dehisced the amputation as he had a wound VAC in early March. He was admitted to hospital from 10/04/2018 through 10/18/2018 with MSSA/strep sepsis. A TEE was negative. I do not believe during this admission he was felt to have osteomyelitis however an x-ray on 10/28/2018 showed concern  for osteomyelitis at the remaining second third and fourth metatarsals. An x-ray on 12/19/2018 showed progressive new bone formation at the transmetatarsal amputation sites of the second through fifth digits consistent with either acute or chronic osteomyelitis. It is hard to tell although after the discharge in March it looks as though he had a course of vancomycin and may be oral antibiotics although I am not sure about this or the duration of antibiotic therapy. The patient states that the wound opened up several months ago. It was clear that there was a wound on the plantar left foot on 12/27/2018. He was in the ER on 9/20 with a large wound on the left plantar foot given Silvadene. The patient states he has been putting bizarre combinations of things on this wound including castor oil. Past medical history; congestive heart failure, type 2 diabetes, bipolar disorder, right BKA, remote history of substance abuse, atrial fibrillation Arterial studies done on the left and 05/06/2018 showed an ABI of 1.21 with triphasic waveforms. 10/15; I did talk with Dr. Hal Hopeichter and asked her to order a CRP and sedimentation rate. Based on the lab work that she is doing inflammatory markers white count etc. we will order an MRI of the left foot [see discussion above]. She actually stated that she has not really been following him regularly so was not really aware of what had been done in March. His wound actually looks somewhat better today and  slightly smaller in terms of surface area Objective Constitutional Sitting or standing Blood Pressure is within target range for patient.. Pulse regular and within target range for patient.Marland Kitchen. Respirations regular, non-labored and within target range.. Temperature is normal and within the target range for the patient.Marland Kitchen. Appears in no distress. Vitals Time Taken: 8:32 AM, Height: 76 in, Weight: 240 lbs, BMI: 29.2, Temperature: 97.7 F, Pulse: 78 bpm, Respiratory Rate: 16 breaths/min, Blood Pressure: 117/72 mmHg. Eyes Conjunctivae clear. No discharge.no icterus. Cardiovascular Pedal pulses palpable on the left. Lymphatic None palpable in the popliteal. Musculoskeletal Left TMA. General Notes: Wound exam; the wound is measuring slightly smaller. Surface looked quite viable. Under illumination there is a very thin layer of surface debris but no mechanical debridement today. There is no evidence of infection Integumentary (Hair, Skin) No erythema around the wound. Wound #1 status is Open. Original cause of wound was Gradually Appeared. The wound is located on the Left,Plantar Foot. The wound measures 3.9cm length x 3.3cm width x 0.2cm depth; 10.108cm^2 area and 2.022cm^3 volume. There is Fat Layer (Subcutaneous Tissue) Exposed exposed. There is no tunneling or undermining noted. There is a medium amount of serosanguineous drainage noted. The wound margin is thickened. There is large (67-100%) red granulation within the wound bed. There is no necrotic tissue within the wound bed. 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 Dressing Change Frequency: Wound #1 Left,Plantar Foot: Change Dressing every other day. Skin Barriers/Peri-Wound Care: Moisturizing  lotion - to left leg daily. Wound Cleansing: Wound #1 Left,Plantar Foot: May shower and wash wound with soap and water. - with dressing changes. Primary Wound Dressing: Wound #1 Left,Plantar Foot: Calcium Alginate with Silver Secondary Dressing: Wound #1 Left,Plantar Foot: Kerlix/Rolled Gauze Dry Gauze Other: - felt callous pad Edema Control: Avoid standing for long periods of time Elevate legs to the level of the  heart or above for 30 minutes daily and/or when sitting, a frequency of: - throughout the day. Additional Orders / Instructions: Stop/Decrease Smoking Follow Nutritious Diet - increase protein and vegetables in diabetic diet. 1. Continue with silver alginate, the wound looks slightly better 2. Continue to offload this is best he can which in this man's case is simply to limit his walking since he has a right below-knee prosthesis. 3. I am waiting for the lab work results from Dr. Wendee Copp office, based on this we will consider an MRI Electronic Signature(s) Signed: 05/16/2019 5:35:18 PM By: Baltazar Najjar Cory Palmer Entered By: Baltazar Najjar on 05/16/2019 09:28:51 -------------------------------------------------------------------------------- SuperBill Details Patient Name: Date of Service: Cory Palmer, Cory Palmer 05/16/2019 Medical Record [id number] Patient Account Number: [id number] Date of Birth/Sex: Treating RN: 01-12-1961 (58 y.o. Cory Palmer Primary Care Provider: PATIENT, NO Other Clinician: Referring Provider: Treating Provider/Extender:Frona Yost, [name] in Treatment: 1 Diagnosis Coding ICD-10 Codes Code Description E11.621 Type 2 diabetes mellitus with foot ulcer M86.672 Other chronic osteomyelitis, left ankle and foot E11.42 Type 2 diabetes mellitus with diabetic polyneuropathy Z89.511 Acquired absence of right leg below knee Facility Procedures The patient participates with Medicare or their insurance follows the Medicare Facility Guidelines:  CPT4 Code Description Modifier Quantity [id number] 99213 - WOUND CARE VISIT-LEV 3 EST PT 1 Physician Procedures CPT4 Code: [id number] Description: 99213 - WC PHYS LEVEL 3 - EST PT ICD-10 Diagnosis Description E11.621 Type 2 diabetes mellitus with foot ulcer M86.672 Other chronic osteomyelitis, left ankle and foot Modifier: Quantity: 1 Electronic Signature(s) Signed: 05/16/2019 5:35:18 PM By: Baltazar Najjar Cory Palmer Signed: 05/17/2019 6:00:55 PM By: Zandra Abts RN, BSN Entered By: Zandra Abts on 05/16/2019 10:59:03

## 2019-05-23 ENCOUNTER — Other Ambulatory Visit: Payer: Self-pay

## 2019-05-23 ENCOUNTER — Encounter (HOSPITAL_BASED_OUTPATIENT_CLINIC_OR_DEPARTMENT_OTHER): Payer: Medicare Other | Admitting: Internal Medicine

## 2019-05-23 DIAGNOSIS — E11621 Type 2 diabetes mellitus with foot ulcer: Secondary | ICD-10-CM | POA: Diagnosis not present

## 2019-05-23 NOTE — Progress Notes (Signed)
Cory Palmer, Cory Palmer ([id number]) Visit Report for 05/23/2019 Debridement Details Patient Name: Date of Service: Cory Palmer, Cory Palmer 05/23/2019 1:15 PM Medical Record [id number] Patient Account Number: [id number] Date of Birth/Sex: 04-18-1961 (57 y.o. M) Treating RN: Shawn Stall Primary Care Provider: PATIENT, NO Other Clinician: Referring Provider: Treating Provider/Extender:Jerimah Witucki, [name] in Treatment: 2 Debridement Performed for Wound #1 Left,Plantar Foot Assessment: Performed By: Physician [name], MD Debridement Type: Debridement Severity of Tissue Pre Fat layer exposed Debridement: Level of Consciousness (Pre- Awake and Alert procedure): Pre-procedure Verification/Time Out Taken: Yes - 13:46 Start Time: 13:47 Pain Control: Lidocaine 5% topical ointment Total Area Debrided (L x W): 4 (cm) x 3 (cm) = 12 (cm) Tissue and other material Viable, Non-Viable, Callus, Subcutaneous, Skin: Dermis , Fibrin/Exudate debrided: Level: Skin/Subcutaneous Tissue Debridement Description: Excisional Instrument: Curette Bleeding: Moderate Hemostasis Achieved: Pressure End Time: 13:48 Procedural Pain: 2 Post Procedural Pain: 4 Response to Treatment: Procedure was tolerated well Level of Consciousness Awake and Alert (Post-procedure): Post Debridement Measurements of Total Wound Length: (cm) 3.4 Width: (cm) 2.8 Depth: (cm) 0.2 Volume: (cm) 1.495 Character of Wound/Ulcer Post Improved Debridement: Severity of Tissue Post Debridement: Fat layer exposed Post Procedure Diagnosis Same as Pre-procedure Electronic Signature(s) Signed: 05/23/2019 6:29:04 PM By: Baltazar Najjar MD Signed: 05/23/2019 6:35:09 PM By: Shawn Stall Entered By: Baltazar Najjar on 05/23/2019 13:51:44 -------------------------------------------------------------------------------- HPI Details Patient Name: Date of Service: Cory Palmer 05/23/2019 1:15 PM Medical Record [id number]  Patient Account Number: [id number] Date of Birth/Sex: Treating RN: July 05, 1961 (58 y.o. Cory Palmer Primary Care Provider: PATIENT, NO Other Clinician: Referring Provider: Treating Provider/Extender:Oluwatoni Rotunno, [name] in Treatment: 2 History of Present Illness HPI Description: ADMISSION 05/09/2019 This is a 58 year old man with type 2 diabetes. He has had a previous right BKA done in [place] 4 years ago. Looking back through [place] link this year he had a left fourth toe wound in January 2020. By looking at the pictures he had also had previous amputation of the first second and third toes as well as a right BKA. 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 [name] on 09/28/2018. I believe at some point he dehisced the amputation as he had a wound VAC in early March. He was admitted to hospital from 10/04/2018 through 10/18/2018 with MSSA/strep sepsis. A TEE was negative. I do not believe during this admission he was felt to have osteomyelitis however an x-ray on 10/28/2018 showed concern for osteomyelitis at the remaining second third and fourth metatarsals. An x-ray on 12/19/2018 showed progressive new bone formation at the transmetatarsal amputation sites of the second through fifth digits consistent with either acute or chronic osteomyelitis. It is hard to tell although after the discharge in March it looks as though he had a course of vancomycin and may be oral antibiotics although I am not sure about this or the duration of antibiotic therapy. The patient states that the wound opened up several months ago. It was clear that there was a wound on the plantar left foot on 12/27/2018. He was in the ER on 9/20 with a large wound on the left plantar foot given Silvadene. The patient states he has been putting bizarre combinations of things on this wound including castor oil. Past medical history; congestive heart failure, type 2 diabetes, bipolar disorder, right  BKA, remote history of substance abuse, atrial fibrillation Arterial studies done on the left and 05/06/2018 showed an ABI of 1.21 with triphasic waveforms. 10/15; I did talk with Dr.  Hal Hope and asked her to order a CRP and sedimentation rate. Based on the lab work that she is doing inflammatory markers white count etc. we will order an MRI of the left foot [see discussion above]. She actually stated that she has not really been following him regularly so was not really aware of what had been done in March. His wound actually looks somewhat better today and slightly smaller in terms of surface area 10/22; surprisingly the patient's labs that I ordered with Dr. Wendee Copp assistance were not that bad. White count was 4.9 differential count normal. Sedimentation rate was 23 comprehensive metabolic panel was normal. Hemoglobin A1c 5.9 I thought I saw the C-reactive protein but I cannot put my finger on this right at the moment. His wound actually looks somewhat better measures better. He did however have some fluorescent green drainage Electronic Signature(s) Signed: 05/23/2019 6:29:04 PM By: Baltazar Najjar MD Entered By: Baltazar Najjar on 05/23/2019 13:53:59 -------------------------------------------------------------------------------- Physical Exam Details Patient Name: Date of Service: Cory Palmer, Cory Palmer 05/23/2019 1:15 PM Medical Record [id number] Patient Account Number: [id number] Date of Birth/Sex: Treating RN: [birth year] (57 y.o. Cory Palmer Primary Care Provider: PATIENT, NO Other Clinician: Referring Provider: Treating Provider/Extender:Aniyha Tate, [name] in Treatment: 2 Constitutional Sitting or standing Blood Pressure is within target range for patient.. Pulse regular and within target range for patient.Marland Kitchen Respirations regular, non-labored and within target range.. Temperature is normal and within the target range for the patient.Marland Kitchen Appears in no  distress. Notes Wound exam; the wound is measuring smaller. Some macerated tissue around the wound. Using a #5 curette skin and subcutaneous tissue removed to reveal a clean wound edge. Wound bed debrided of a fibrinous adherent debris. Hemostasis with direct pressure. There is greenish discoloration in his skin around the wound Electronic Signature(s) Signed: 05/23/2019 6:29:04 PM By: Baltazar Najjar MD Entered By: Baltazar Najjar on 05/23/2019 13:55:01 -------------------------------------------------------------------------------- Physician Orders Details Patient Name: Date of Service: Cory Palmer, Cory Palmer 05/23/2019 1:15 PM Medical Record [id number] Patient Account Number: [id number] Date of Birth/Sex: Treating RN: Jan 22, 1961 (58 y.o. 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 Dressing Change Frequency Wound #1 Left,Plantar Foot Change Dressing every other day. Skin Barriers/Peri-Wound Care Moisturizing lotion - to left leg daily. Wound Cleansing Wound #1 Left,Plantar Foot May shower and wash wound with soap and water. - with dressing changes. Primary Wound Dressing Wound #1 Left,Plantar Foot Calcium Alginate with Silver Secondary Dressing Wound #1 Left,Plantar Foot Kerlix/Rolled Gauze ABD pad Other: - felt callous pad Edema Control Avoid standing for long periods of time Elevate legs to the level of the heart or above for 30 minutes daily and/or when sitting, a frequency of: - throughout the day. Additional Orders  / Instructions Stop/Decrease Smoking Follow Nutritious Diet - increase protein and vegetables in diabetic diet. Patient Medications Allergies: tramadol Notifications Medication Indication Start End cephalexin wound infection 05/23/2019 DOSE oral 500 mg capsule - 1 capsule oral q6h for 7days Electronic Signature(s) Signed: 05/23/2019 1:58:28 PM By: Baltazar Najjar MD Entered By: Baltazar Najjar on 05/23/2019 13:58:27 -------------------------------------------------------------------------------- Problem List Details Patient Name: Date of Service: Cory Palmer 05/23/2019 1:15 PM Medical Record [id number]  Patient Account Number: [id number] Date of Birth/Sex: Treating RN: 1961/04/02 (58 y.o. Cory Palmer Primary Care Provider: PATIENT, NO Other Clinician: Referring Provider: Treating Provider/Extender:Riddhi Grether, [name] in Treatment: 2 Active Problems ICD-10 Evaluated Encounter Code Description Active Date Today Diagnosis E11.621 Type 2 diabetes mellitus with foot ulcer 05/09/2019 No Yes E11.42 Type 2 diabetes mellitus with diabetic polyneuropathy 05/09/2019 No Yes Z89.511 Acquired absence of right leg below knee 05/09/2019 No Yes M86.672 Other chronic osteomyelitis, left ankle and foot 05/09/2019 No Yes Inactive Problems Resolved Problems Electronic Signature(s) Signed: 05/23/2019 6:29:04 PM By: [name] MD Entered By: [name] on 05/23/2019 13:51:27 -------------------------------------------------------------------------------- Progress Note Details Patient Name: Date of Service: Cory Palmer 05/23/2019 1:15 PM Medical Record [id number] Patient Account Number: [id number] Date of Birth/Sex: Treating RN: September 25, 1960 (58 y.o. Cory Palmer Primary Care Provider: PATIENT, NO Other Clinician: Referring Provider: Treating Provider/Extender:Oluwatoni Rotunno, [name] in Treatment: 2 Subjective History of Present Illness  (HPI) ADMISSION 05/09/2019 This is a 58 year old man with type 2 diabetes. He has had a previous right BKA done in [place] 4 years ago. Looking back through [place] link this year he had a left fourth toe wound in January 2020. By looking at the pictures he had also had previous amputation of the first second and third toes as well as a right BKA. 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 Sharol Given on 09/28/2018. I believe at some point he dehisced the amputation as he had a wound VAC in early March. He was admitted to hospital from 10/04/2018 through 10/18/2018 with MSSA/strep sepsis. A TEE was negative. I do not believe during this admission he was felt to have osteomyelitis however an x-ray on 10/28/2018 showed concern for osteomyelitis at the remaining second third and fourth metatarsals. An x-ray on 12/19/2018 showed progressive new bone formation at the transmetatarsal amputation sites of the second through fifth digits consistent with either acute or chronic osteomyelitis. It is hard to tell although after the discharge in March it looks as though he had a course of vancomycin and may be oral antibiotics although I am not sure about this or the duration of antibiotic therapy. The patient states that the wound opened up several months ago. It was clear that there was a wound on the plantar left foot on 12/27/2018. He was in the ER on 9/20 with a large wound on the left plantar foot given Silvadene. The patient states he has been putting bizarre combinations of things on this wound including castor oil. Past medical history; congestive heart failure, type 2 diabetes, bipolar disorder, right BKA, remote history of substance abuse, atrial fibrillation Arterial studies done on the left and 05/06/2018 showed an ABI of 1.21 with triphasic waveforms. 10/15; I did talk with Dr. Darron Doom and asked her to order a CRP and sedimentation rate. Based on the lab work that she is  doing inflammatory markers white count etc. we will order an MRI of the left foot [see discussion above]. She actually stated that she has not really been following him regularly so was not really aware of what had been done in March. His wound actually looks somewhat better today and slightly smaller in terms of surface area 10/22; surprisingly the patient's labs that I ordered with Dr. Dennie Fetters assistance were not that bad. White count was 4.9 differential count normal. Sedimentation rate was 23 comprehensive metabolic panel was normal. Hemoglobin A1c 5.9 I thought I saw the C-reactive protein but I cannot put my  finger on this right at the moment. His wound actually looks somewhat better measures better. He did however have some fluorescent green drainage Objective Constitutional Sitting or standing Blood Pressure is within target range for patient.. Pulse regular and within target range for patient.Marland Kitchen Respirations regular, non-labored and within target range.. Temperature is normal and within the target range for the patient.Marland Kitchen Appears in no distress. Vitals Time Taken: 1:25 PM, Height: 76 in, Weight: 240 lbs, BMI: 29.2, Temperature: 97.8 F, Pulse: 75 bpm, Respiratory Rate: 16 breaths/min, Blood Pressure: 135/70 mmHg. General Notes: Wound exam; the wound is measuring smaller. Some macerated tissue around the wound. Using a #5 curette skin and subcutaneous tissue removed to reveal a clean wound edge. Wound bed debrided of a fibrinous adherent debris. Hemostasis with direct pressure. There is greenish discoloration in his skin around the wound Integumentary (Hair, Skin) Wound #1 status is Open. Original cause of wound was Gradually Appeared. The wound is located on the Left,Plantar Foot. The wound measures 3.4cm length x 2.8cm width x 0.2cm depth; 7.477cm^2 area and 1.495cm^3 volume. There is Fat Layer (Subcutaneous Tissue) Exposed exposed. There is no tunneling or undermining noted. There is  a medium amount of serosanguineous drainage noted. The wound margin is thickened. There is large (67- 100%) red granulation within the wound bed. There is no necrotic tissue within the wound bed. General Notes: callus to periwound. 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 There was a Excisional Skin/Subcutaneous Tissue Debridement with a total area of 12 sq cm performed by [name], MD. With the following instrument(s): Curette to remove Viable and Non-Viable tissue/material. Material removed includes Callus, Subcutaneous Tissue, Skin: Dermis, and Fibrin/Exudate after achieving pain control using Lidocaine 5% topical ointment. A time out was conducted at 13:46, prior to the start of the procedure. A Moderate amount of bleeding was controlled with Pressure. The procedure was tolerated well with a pain level of 2 throughout and a pain level of 4 following the procedure. Post Debridement Measurements: 3.4cm length x 2.8cm width x 0.2cm depth; 1.495cm^3 volume. Character of Wound/Ulcer Post Debridement is improved. Severity of Tissue Post Debridement is: Fat layer exposed. Post procedure Diagnosis Wound #1: Same as Pre-Procedure Plan Follow-up Appointments: Return Appointment in 2 weeks. Dressing Change Frequency: Wound #1 Left,Plantar Foot: Change Dressing every other day. Skin Barriers/Peri-Wound Care: Moisturizing lotion - to left leg daily. Wound Cleansing: Wound #1 Left,Plantar Foot: May shower and wash wound with soap and water. - with dressing changes. Primary Wound Dressing: Wound #1 Left,Plantar  Foot: Calcium Alginate with Silver Secondary Dressing: Wound #1 Left,Plantar Foot: Kerlix/Rolled Gauze ABD pad Other: - felt callous pad Edema Control: Avoid standing for long periods of time Elevate legs to the level of the heart or above for 30 minutes daily and/or when sitting, a frequency of: - throughout the day. Additional Orders / Instructions: Stop/Decrease Smoking Follow Nutritious Diet - increase protein and vegetables in diabetic diet. The following medication(s) was prescribed: cephalexin oral 500 mg capsule 1 capsule oral q6h for 7days for wound infection starting 05/23/2019 1. Empiric cephalexin 2. Continue with silver alginate 3. His sedimentation rate at 21 and a C-reactive protein that I  remember is being only slightly elevated does not suggest an active infection therefore I am not going to do the MRI at this point. I will keep this in reserve if the wound deteriorates or gets larger. Electronic Signature(s) Signed: 05/23/2019 6:29:04 PM By: Baltazar Najjarobson, Lucion Dilger MD Entered By: Baltazar Najjarobson, Harla Mensch on 05/23/2019 13:59:10 -------------------------------------------------------------------------------- SuperBill Details Patient Name: Date of Service: Cory DrillingCOLE, Cory B. 05/23/2019 Medical Record [id number] Patient Account Number: [id number] Date of Birth/Sex: Treating RN: [birth year] (58 y.o. 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 Facility Procedures The patient participates with Medicare or their insurance follows the Medicare Facility Guidelines: CPT4 Code Description Modifier Quantity [id number]  11042 - DEB SUBQ TISSUE 20 SQ CM/< 1 ICD-10 Diagnosis Description E11.621 Type 2 diabetes mellitus with  foot ulcer Physician Procedures CPT4 Code: [id number] Description: 11042 - WC PHYS SUBQ TISS 20 SQ CM ICD-10 Diagnosis Description E11.621 Type 2 diabetes mellitus with foot ulcer Modifier: Quantity: 1 Electronic Signature(s) Signed: 05/23/2019 6:29:04 PM By: Baltazar Najjarobson, Chemere Steffler MD Entered By: Baltazar Najjarobson, Lc Joynt on 05/23/2019 13:59:19

## 2019-06-06 ENCOUNTER — Encounter (HOSPITAL_BASED_OUTPATIENT_CLINIC_OR_DEPARTMENT_OTHER): Payer: Medicare Other | Attending: Internal Medicine | Admitting: Internal Medicine

## 2019-06-06 ENCOUNTER — Other Ambulatory Visit: Payer: Self-pay

## 2019-06-06 DIAGNOSIS — I10 Essential (primary) hypertension: Secondary | ICD-10-CM | POA: Diagnosis not present

## 2019-06-06 DIAGNOSIS — Z89511 Acquired absence of right leg below knee: Secondary | ICD-10-CM | POA: Diagnosis not present

## 2019-06-06 DIAGNOSIS — M86672 Other chronic osteomyelitis, left ankle and foot: Secondary | ICD-10-CM | POA: Insufficient documentation

## 2019-06-06 DIAGNOSIS — Z885 Allergy status to narcotic agent status: Secondary | ICD-10-CM | POA: Insufficient documentation

## 2019-06-06 DIAGNOSIS — Z888 Allergy status to other drugs, medicaments and biological substances status: Secondary | ICD-10-CM | POA: Diagnosis not present

## 2019-06-06 DIAGNOSIS — E11621 Type 2 diabetes mellitus with foot ulcer: Secondary | ICD-10-CM | POA: Insufficient documentation

## 2019-06-06 DIAGNOSIS — E1142 Type 2 diabetes mellitus with diabetic polyneuropathy: Secondary | ICD-10-CM | POA: Diagnosis not present

## 2019-06-06 NOTE — Progress Notes (Signed)
GILLIS, BOARDLEY ([id number]) Visit Report for 06/06/2019 HPI Details Patient Name: Date of Service: Cory Palmer, Cory Palmer 06/06/2019 12:45 PM Medical Record [id number] Patient Account Number: [id number] Date of Birth/Sex: Treating RN: [birth year] (58 y.o. [name] Primary Care Provider: PATIENT, NO Other Clinician: Referring Provider: Treating Provider/Extender:Robson, [name] in Treatment: 4 History of Present Illness HPI Description: ADMISSION 05/09/2019 This is a 58 year old man with type 2 diabetes. He has had a previous right BKA done in [place] 4 years ago. Looking back through [place] [facility] this year he had a left fourth toe wound in January 2020. By looking at the pictures he had also had previous amputation of the first second and third toes as well as a right BKA. 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 [name] on 09/28/2018. I believe at some point he dehisced the amputation as he had a wound VAC in early March. He was admitted to hospital from 10/04/2018 through 10/18/2018 with MSSA/strep sepsis. A TEE was negative. I do not believe during this admission he was felt to have osteomyelitis however an x-ray on 10/28/2018 showed concern for osteomyelitis at the remaining second third and fourth metatarsals. An x-ray on 12/19/2018 showed progressive new bone formation at the transmetatarsal amputation sites of the second through fifth digits consistent with either acute or chronic osteomyelitis. It is hard to tell although after the discharge in March it looks as though he had a course of vancomycin and may be oral antibiotics although I am not sure about this or the duration of antibiotic therapy. The patient states that the wound opened up several months ago. It was clear that there was a wound on the plantar left foot on 12/27/2018. He was in the ER on 9/20 with a large wound on the left plantar foot given Silvadene. The patient states he has  been putting bizarre combinations of things on this wound including castor oil. Past medical history; congestive heart failure, type 2 diabetes, bipolar disorder, right BKA, remote history of substance abuse, atrial fibrillation Arterial studies done on the left and 05/06/2018 showed an ABI of 1.21 with triphasic waveforms. 10/15; I did talk with Dr. Hal Hope and asked her to order a CRP and sedimentation rate. Based on the lab work that she is doing inflammatory markers white count etc. we will order an MRI of the left foot [see discussion above]. She actually stated that she has not really been following him regularly so was not really aware of what had been done in March. His wound actually looks somewhat better today and slightly smaller in terms of surface area 10/22; surprisingly the patient's labs that I ordered with Dr. Wendee Copp assistance were not that bad. White count was 4.9 differential count normal. Sedimentation rate was 23 comprehensive metabolic panel was normal. Hemoglobin A1c 5.9 I thought I saw the C-reactive protein but I cannot put my finger on this right at the moment. His wound actually looks somewhat better measures better. He did however have some fluorescent green drainage 11/5; the patient's wound is come in a centimeter in width. We have been using silver alginate he has been changing the dressing himself.. We do not have a way to aggressively offload this as he will has a right leg prosthesis. Nursing still reports green drainage. I gave him antibiotics for this Electronic Signature(s) Signed: 06/06/2019 5:45:12 PM By: Baltazar Najjar MD Entered By: Baltazar Najjar on 06/06/2019 14:11:35 -------------------------------------------------------------------------------- Physical Exam Details Patient Name: Date of Service:  Cory Palmer, Cory B. 06/06/2019 12:45 PM Medical Record [id number] Patient Account Number: [id number] Date of Birth/Sex: Treating RN: January 26, 1961 (58  y.o. Tammy SoursM) Deaton, Bobbi Primary Care Provider: PATIENT, NO Other Clinician: Referring Provider: Treating Provider/Extender:Robson, Lamar SprinklesMichael Weeks in Treatment: 4 Constitutional Sitting or standing Blood Pressure is within target range for patient.. Pulse regular and within target range for patient.Marland Kitchen. Respirations regular, non-labored and within target range.. Temperature is normal and within the target range for the patient.Marland Kitchen. Appears in no distress. Eyes Conjunctivae clear. No discharge.no icterus. Respiratory work of breathing is normal. Cardiovascular Pedal pulses palpable. Musculoskeletal Right BKA. Integumentary (Hair, Skin) There is some greenish drainage around the callus around the wound but I do not believe this represents active infection. Psychiatric appears at normal baseline. Notes Wound exam; smaller by a centimeter in width. The edges of the wound are a bit rolled and senescent. I had some thoughts about an extensive debridement of these edges today but as long as the wound is contracting I will wait and see. The wound bed looks healthy. Electronic Signature(s) Signed: 06/06/2019 5:45:12 PM By: Baltazar Najjarobson, Michael MD Entered By: Baltazar Najjarobson, Michael on 06/06/2019 14:16:30 -------------------------------------------------------------------------------- Physician Orders Details Patient Name: Date of Service: Cory Palmer, Cory B. 06/06/2019 12:45 PM Medical Record [id number] Patient Account Number: [id number] Date of Birth/Sex: Treating RN: January 26, 1961 (58 y.o. Katherina RightM) Dwiggins, Shannon Primary Care Provider: PATIENT, NO Other Clinician: Referring Provider: Treating Provider/Extender:Robson, Lamar SprinklesMichael Weeks in Treatment: 4 Verbal / Phone Orders: No Diagnosis Coding Follow-up Appointments Return Appointment in 2 weeks. Dressing Change Frequency Wound #1 Left,Plantar Foot Change Dressing every other day. Skin Barriers/Peri-Wound Care Moisturizing lotion - to left leg daily. Wound  Cleansing Wound #1 Left,Plantar Foot May shower and wash wound with soap and water. - with dressing changes. Primary Wound Dressing Wound #1 Left,Plantar Foot Calcium Alginate with Silver Secondary Dressing Wound #1 Left,Plantar Foot Kerlix/Rolled Gauze ABD pad Other: - felt callous pad Edema Control Avoid standing for long periods of time Elevate legs to the level of the heart or above for 30 minutes daily and/or when sitting, a frequency of: - throughout the day. Additional Orders / Instructions Stop/Decrease Smoking Follow Nutritious Diet - increase protein and vegetables in diabetic diet. Electronic Signature(s) Signed: 06/06/2019 5:16:49 PM By: Cherylin Mylarwiggins, Shannon Signed: 06/06/2019 5:45:12 PM By: Baltazar Najjarobson, Michael MD Entered By: Cherylin Mylarwiggins, Shannon on 06/06/2019 13:53:41 -------------------------------------------------------------------------------- Problem List Details Patient Name: Date of Service: Cory Palmer, Cory B. 06/06/2019 12:45 PM Medical Record [id number] Patient Account Number: [id number] Date of Birth/Sex: Treating RN: January 26, 1961 (58 y.o. Katherina RightM) Dwiggins, Shannon Primary Care Provider: PATIENT, NO Other Clinician: Referring Provider: Treating Provider/Extender:Robson, Lamar SprinklesMichael Weeks in Treatment: 4 Active Problems ICD-10 Evaluated Encounter Code Description Active Date Today Diagnosis E11.621 Type 2 diabetes mellitus with foot ulcer 05/09/2019 No Yes E11.42 Type 2 diabetes mellitus with diabetic polyneuropathy 05/09/2019 No Yes Z89.511 Acquired absence of right leg below knee 05/09/2019 No Yes M86.672 Other chronic osteomyelitis, left ankle and foot 05/09/2019 No Yes Inactive Problems Resolved Problems Electronic Signature(s) Signed: 06/06/2019 5:45:12 PM By: Baltazar Najjarobson, Michael MD Entered By: Baltazar Najjarobson, Michael on 06/06/2019 14:10:28 -------------------------------------------------------------------------------- Progress Note Details Patient Name: Date of  Service: Cory Palmer, Cory B. 06/06/2019 12:45 PM Medical Record [id number] Patient Account Number: [id number] Date of Birth/Sex: Treating RN: January 26, 1961 (58 y.o. Tammy SoursM) Deaton, Bobbi Primary Care Provider: PATIENT, NO Other Clinician: Referring Provider: Treating Provider/Extender:Robson, Lamar SprinklesMichael Weeks in Treatment: 4 Subjective History of Present Illness (HPI) ADMISSION 05/09/2019 This is a 58 year old man with type 2 diabetes. He has had a previous right  BKA done in [place] 4 years ago. Looking back through [place] [facility] this year he had a left fourth toe wound in January 2020. By looking at the pictures he had also had previous amputation of the first second and third toes as well as a right BKA. 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 [name] on 09/28/2018. I believe at some point he dehisced the amputation as he had a wound VAC in early March. He was admitted to hospital from 10/04/2018 through 10/18/2018 with MSSA/strep sepsis. A TEE was negative. I do not believe during this admission he was felt to have osteomyelitis however an x-ray on 10/28/2018 showed concern for osteomyelitis at the remaining second third and fourth metatarsals. An x-ray on 12/19/2018 showed progressive new bone formation at the transmetatarsal amputation sites of the second through fifth digits consistent with either acute or chronic osteomyelitis. It is hard to tell although after the discharge in March it looks as though he had a course of vancomycin and may be oral antibiotics although I am not sure about this or the duration of antibiotic therapy. The patient states that the wound opened up several months ago. It was clear that there was a wound on the plantar left foot on 12/27/2018. He was in the ER on 9/20 with a large wound on the left plantar foot given Silvadene. The patient states he has been putting bizarre combinations of things on this wound including castor oil. Past  medical history; congestive heart failure, type 2 diabetes, bipolar disorder, right BKA, remote history of substance abuse, atrial fibrillation Arterial studies done on the left and 05/06/2018 showed an ABI of 1.21 with triphasic waveforms. 10/15; I did talk with Dr. Hal Hope and asked her to order a CRP and sedimentation rate. Based on the lab work that she is doing inflammatory markers white count etc. we will order an MRI of the left foot [see discussion above]. She actually stated that she has not really been following him regularly so was not really aware of what had been done in March. His wound actually looks somewhat better today and slightly smaller in terms of surface area 10/22; surprisingly the patient's labs that I ordered with Dr. Wendee Copp assistance were not that bad. White count was 4.9 differential count normal. Sedimentation rate was 23 comprehensive metabolic panel was normal. Hemoglobin A1c 5.9 I thought I saw the C-reactive protein but I cannot put my finger on this right at the moment. His wound actually looks somewhat better measures better. He did however have some fluorescent green drainage 11/5; the patient's wound is come in a centimeter in width. We have been using silver alginate he has been changing the dressing himself.. We do not have a way to aggressively offload this as he will has a right leg prosthesis. Nursing still reports green drainage. I gave him antibiotics for this Objective Constitutional Sitting or standing Blood Pressure is within target range for patient.. Pulse regular and within target range for patient.Marland Kitchen Respirations regular, non-labored and within target range.. Temperature is normal and within the target range for the patient.Marland Kitchen Appears in no distress. Vitals Time Taken: 1:39 PM, Height: 76 in, Weight: 240 lbs, BMI: 29.2, Temperature: 98.2 F, Pulse: 94 bpm, Respiratory Rate: 16 breaths/min, Blood Pressure: 121/82 mmHg. Eyes Conjunctivae clear.  No discharge.no icterus. Respiratory work of breathing is normal. Cardiovascular Pedal pulses palpable. Musculoskeletal Right BKA. Psychiatric appears at normal baseline. General Notes: Wound exam; smaller by a centimeter in  width. The edges of the wound are a bit rolled and senescent. I had some thoughts about an extensive debridement of these edges today but as long as the wound is contracting I will wait and see. The wound bed looks healthy. Integumentary (Hair, Skin) There is some greenish drainage around the callus around the wound but I do not believe this represents active infection. Wound #1 status is Open. Original cause of wound was Gradually Appeared. The wound is located on the [place]. The wound measures 3.2cm length x 1.9cm width x 0.3cm depth; 4.775cm^2 area and 1.433cm^3 volume. There is Fat Layer (Subcutaneous Tissue) Exposed exposed. There is undermining starting at 12:00 and ending at 12:00 with a maximum distance of 0.3cm. There is a large amount of purulent drainage noted. The wound margin is thickened. There is large (67-100%) pink granulation within the wound bed. There is no necrotic tissue within the wound bed. 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 Dressing Change Frequency: Wound #1 Left,Plantar Foot: Change Dressing every other day. Skin Barriers/Peri-Wound Care: Moisturizing lotion - to left leg daily. Wound Cleansing: Wound #1 Left,Plantar Foot: May shower and wash wound with soap and water. - with dressing changes. Primary Wound Dressing: Wound #1 Left,Plantar Foot: Calcium Alginate with Silver Secondary Dressing: Wound #1 Left,Plantar Foot: Kerlix/Rolled Gauze ABD pad Other: - felt callous  pad Edema Control: Avoid standing for long periods of time Elevate legs to the level of the heart or above for 30 minutes daily and/or when sitting, a frequency of: - throughout the day. Additional Orders / Instructions: Stop/Decrease Smoking Follow Nutritious Diet - increase protein and vegetables in diabetic diet. 1. Continue with silver alginate/Kerlix/rolled gauze 2. The offloading here will be the issue 3. May require debridement of the edges of this wound next time Electronic Signature(s) Signed: 06/06/2019 5:45:12 PM By: [name] MD Entered By: [name] on 06/06/2019 14:17:30 -------------------------------------------------------------------------------- SuperBill Details Patient Name: Date of Service: Cory Palmer 06/06/2019 Medical Record [id number] Patient Account Number: [id number] Date of Birth/Sex: Treating RN: [birth year] (58 y.o. Marvis Repress Primary Care Provider: PATIENT, NO Other Clinician: Referring Provider: Treating Provider/Extender:Robson, [name] in Treatment: 4 Diagnosis Coding ICD-10 Codes Code Description E11.621 Type 2 diabetes mellitus with foot ulcer E11.42 Type 2 diabetes mellitus with diabetic polyneuropathy Z89.511 Acquired absence of right leg below knee M86.672 Other chronic osteomyelitis, left ankle and foot Facility Procedures The patient participates with Medicare or their insurance follows the Medicare Facility Guidelines: CPT4 Code Description Modifier Quantity [id number] [place] VISIT-LEV 3 EST PT 1 Physician Procedures CPT4 Code: [id number] Description: [id number] - WC PHYS LEVEL 3 - EST PT ICD-10 Diagnosis Description E11.621 Type 2 diabetes mellitus with foot ulcer Modifier: Quantity: 1 Electronic Signature(s) Signed: 06/06/2019 5:45:12 PM By: [name] MD Entered By: [name] on 06/06/2019 14:17:40

## 2019-06-20 ENCOUNTER — Ambulatory Visit (HOSPITAL_COMMUNITY)
Admission: RE | Admit: 2019-06-20 | Discharge: 2019-06-20 | Disposition: A | Discharge status: Home / Self Care | Payer: Medicare Other | Source: Ambulatory Visit | Attending: Internal Medicine | Admitting: Internal Medicine

## 2019-06-20 ENCOUNTER — Encounter (HOSPITAL_BASED_OUTPATIENT_CLINIC_OR_DEPARTMENT_OTHER): Payer: Medicare Other | Admitting: Internal Medicine

## 2019-06-20 ENCOUNTER — Other Ambulatory Visit (HOSPITAL_COMMUNITY)
Admission: RE | Admit: 2019-06-20 | Discharge: 2019-06-20 | Disposition: A | Discharge status: Home / Self Care | Payer: Medicare Other | Attending: Internal Medicine | Admitting: Internal Medicine

## 2019-06-20 ENCOUNTER — Other Ambulatory Visit (HOSPITAL_COMMUNITY): Payer: Self-pay | Admitting: Internal Medicine

## 2019-06-20 ENCOUNTER — Other Ambulatory Visit: Payer: Self-pay

## 2019-06-20 DIAGNOSIS — E13621 Other specified diabetes mellitus with foot ulcer: Secondary | ICD-10-CM | POA: Insufficient documentation

## 2019-06-20 DIAGNOSIS — L97529 Non-pressure chronic ulcer of other part of left foot with unspecified severity: Secondary | ICD-10-CM

## 2019-06-20 DIAGNOSIS — E11621 Type 2 diabetes mellitus with foot ulcer: Secondary | ICD-10-CM | POA: Diagnosis not present

## 2019-06-20 NOTE — Progress Notes (Signed)
Cory Palmer, Cory Palmer ([id number]) Visit Report for 06/20/2019 Debridement Details Patient Name: Date of Service: [name], [name] 06/20/2019 10:00 AM Medical Record [id number] Patient Account Number: [id number] Date of Birth/Sex: January 19, 1961 (58 y.o. M) Treating RN: Deon Pilling Primary Care Provider: PATIENT, NO Other Clinician: Referring Provider: Treating Provider/Extender:Haron Beilke, [name] in Treatment: 6 Debridement Performed for Wound #1 Left,Plantar Foot Assessment: Performed By: Physician [name]., MD Debridement Type: Debridement Severity of Tissue Pre Fat layer exposed Debridement: Level of Consciousness (Pre- Awake and Alert procedure): Pre-procedure Verification/Time Out Taken: Yes - 11:15 Start Time: 11:16 Pain Control: Lidocaine 4% Topical Solution Total Area Debrided (L x W): 2.8 (cm) x 1.7 (cm) = 4.76 (cm) Tissue and other material Viable, Non-Viable, Callus, Subcutaneous, Skin: Dermis , Fibrin/Exudate debrided: Level: Skin/Subcutaneous Tissue Debridement Description: Excisional Instrument: Curette Specimen: Swab, Number of Specimens Taken: 1 Bleeding: Moderate Hemostasis Achieved: Silver Nitrate End Time: 11:20 Procedural Pain: 1 Post Procedural Pain: 3 Response to Treatment: Procedure was tolerated well Level of Consciousness Awake and Alert (Post-procedure): Post Debridement Measurements of Total Wound Length: (cm) 2.8 Width: (cm) 1.7 Depth: (cm) 0.4 Volume: (cm) 1.495 Character of Wound/Ulcer Post Improved Debridement: Severity of Tissue Post Debridement: Fat layer exposed Post Procedure Diagnosis Same as Pre-procedure Electronic Signature(s) Signed: 06/20/2019 6:25:25 PM By: [name] MD Signed: 06/20/2019 6:38:14 PM By: Deon Pilling Entered By: [name] on 06/20/2019 11:28:02 -------------------------------------------------------------------------------- HPI Details Patient Name: Date of Service: Cory Palmer 06/20/2019 10:00 AM Medical Record [id number] Patient Account Number: [id number] Date of Birth/Sex: April 10, 1961 (58 y.o. M) Treating RN: Deon Pilling Primary Care Provider: PATIENT, NO Other Clinician: Referring Provider: Treating Provider/Extender:Oliveah Zwack, [name] in Treatment: 6 History of Present Illness HPI Description: ADMISSION 05/09/2019 This is a 58 year old man with type 2 diabetes. He has had a previous right BKA done in [place] 4 years ago. Looking back through Cory Palmer this year he had a left fourth toe wound in January 2020. By looking at the pictures he had also had previous amputation of the first second and third toes as well as a right BKA. 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 Sharol Given on 09/28/2018. I believe at some point he dehisced the amputation as he had a wound VAC in early March. He was admitted to hospital from 10/04/2018 through 10/18/2018 with MSSA/strep sepsis. A TEE was negative. I do not believe during this admission he was felt to have osteomyelitis however an x-ray on 10/28/2018 showed concern for osteomyelitis at the remaining second third and fourth metatarsals. An x-ray on 12/19/2018 showed progressive new bone formation at the transmetatarsal amputation sites of the second through fifth digits consistent with either acute or chronic osteomyelitis. It is hard to tell although after the discharge in March it looks as though he had a course of vancomycin and may be oral antibiotics although I am not sure about this or the duration of antibiotic therapy. The patient states that the wound opened up several months ago. It was clear that there was a wound on the plantar left foot on 12/27/2018. He was in the ER on 9/20 with a large wound on the left plantar foot given Silvadene. The patient states he has been putting bizarre combinations of things on this wound including castor oil. Past medical history;  congestive heart failure, type 2 diabetes, bipolar disorder, right BKA, remote history of substance abuse, atrial fibrillation Arterial studies done on the left and 05/06/2018 showed an ABI of 1.21 with  triphasic waveforms. 10/15; I did talk with Dr. Darron Palmer and asked her to order a CRP and sedimentation rate. Based on the lab work that she is doing inflammatory markers white count etc. we will order an MRI of the left foot [see discussion above]. She actually stated that she has not really been following him regularly so was not really aware of what had been done in March. His wound actually looks somewhat better today and slightly smaller in terms of surface area 10/22; surprisingly the patient's labs that I ordered with Dr. Dennie Palmer assistance were not that bad. White count was 4.9 differential count normal. Sedimentation rate was 23 comprehensive metabolic panel was normal. Hemoglobin A1c 5.9 I thought I saw the C-reactive protein but I cannot put my finger on this right at the moment. His wound actually looks somewhat better measures better. He did however have some fluorescent green drainage 11/5; the patient's wound is come in a centimeter in width. We have been using silver alginate he has been changing the dressing himself.. We do not have a way to aggressively offload this as he will has a right leg prosthesis. Nursing still reports green drainage. I gave him antibiotics for this 11/19; the patient's wound came in again this time however he is complaining of pain in the inferior part of the wound on the left plantar foot. This is where he is undermining area is I have obtained a culture for CandS and I am going to give him empiric antibiotics. He has a complicated wound history including a right BKA and a left transmetatarsal amputation. The wound is roughly on the third met head remanent Electronic Signature(s) Signed: 06/20/2019 6:25:25 PM By: [name] MD Entered By: [name] on 06/20/2019 11:29:31 -------------------------------------------------------------------------------- Physical Exam Details Patient Name: Date of Service: Cory Palmer, Cory Palmer 06/20/2019 10:00 AM Medical Record [id number] Patient Account Number: [id number] Date of Birth/Sex: 1960-11-03 (58 y.o. M) Treating RN: Deon Pilling Primary Care Provider: PATIENT, NO Other Clinician: Referring Provider: Treating Provider/Extender:Jacia Sickman, [name] in Treatment: 6 Constitutional Sitting or standing Blood Pressure is within target range for patient.. Pulse regular and within target range for patient.Marland Kitchen Respirations regular, non-labored and within target range.. Temperature is normal and within the target range for the patient.Marland Kitchen Appears in no distress. Notes Wound exam; the wound measures smaller however he had undermining with some purulent drainage. Tenderness as well. This was cultured. Extensive debridement of the rolled edges thick callus subcutaneous tissue removed from the wound circumference hemostasis with silver nitrate. Electronic Signature(s) Signed: 06/20/2019 6:25:25 PM By: [name] MD Entered By: [name] on 06/20/2019 11:30:20 -------------------------------------------------------------------------------- Physician Orders Details Patient Name: Date of Service: Cory Palmer, Cory Palmer 06/20/2019 10:00 AM Medical Record Cory [id number] Patient Account Number: [id number] Date of Birth/Sex: Jun 05, 1961 (58 y.o. M) Treating RN: Deon Pilling Primary Care Provider: PATIENT, NO Other Clinician: Referring Provider: Treating Provider/Extender:Aidenjames Heckmann, [name] in Treatment: 6 Verbal / Phone Orders: No Diagnosis Coding ICD-10 Coding Code Description E11.621 Type 2 diabetes mellitus with foot ulcer E11.42 Type 2 diabetes mellitus with diabetic polyneuropathy Z89.511 Acquired absence of right leg below knee M86.672 Other chronic osteomyelitis, left ankle and  foot Follow-up Appointments Return Appointment in 1 week. - to see hoyt next week. Dressing Change Frequency Wound #1 Left,Plantar Foot Change Dressing every other day. Skin Barriers/Peri-Wound Care Moisturizing lotion - to left leg daily. Wound Cleansing Wound #1 Left,Plantar Foot May shower and wash wound with soap and water. - with dressing changes. Primary Wound Dressing Wound #  1 Left,Plantar Foot Calcium Alginate with Silver Secondary Dressing Wound #1 Left,Plantar Foot Kerlix/Rolled Gauze ABD pad Other: - felt callous pad Edema Control Avoid standing for long periods of time Elevate legs to the level of the heart or above for 30 minutes daily and/or when sitting, a frequency of: - throughout the day. Additional Orders / Instructions Stop/Decrease Smoking Follow Nutritious Diet - increase protein and vegetables in diabetic diet. Laboratory Bacteria identified in Unspecified specimen by Aerobe culture (MICRO) - culture of left foot wound. - (ICD10 E11.621 - Type 2 diabetes mellitus with foot ulcer) LOINC Code: 664-4 Convenience Name: Areobic culture-specimen not specified Radiology X-ray, foot - x-ray left foot related to diabetic foot ulcer and foot pain. CPT code - (ICD10 E11.621 - Type 2 diabetes mellitus with foot ulcer) Patient Medications Allergies: tramadol Notifications Medication Indication Start End doxycycline monohydrate wound infection 06/20/2019 left foot DOSE oral 100 mg capsule - 1 capsule oral bid for 7 days Electronic Signature(s) Signed: 06/20/2019 11:33:02 AM By: [name] MD Entered By: [name] on 06/20/2019 11:33:01 -------------------------------------------------------------------------------- Prescription 06/20/2019 Patient Name: Cory Palmer B. Provider: [name] MD Date of Birth: 1961-03-05 NPI#: [id number] Sex: Cory Palmer DEA#: [id number] Phone #: [phone] License #: [id number] Patient Address: [address], [place] [address] [place],  [id number] [phone] Allergies tramadol Provider's Orders X-ray, foot - ICD10: E11.621 - x-ray left foot related to diabetic foot ulcer and foot pain. CPT code Signature(s): Date(s): Electronic Signature(s) Signed: 06/20/2019 6:25:25 PM By: [name] MD Entered By: [name] on 06/20/2019 11:33:02 --------------------------------------------------------------------------------  Problem List Details Patient Name: Date of Service: Cory Palmer, Cory Palmer 06/20/2019 10:00 AM Medical Record [id number] Patient Account Number: [id number] Date of Birth/Sex: [birth year] (58 y.o. 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 M) Treating RN: Deon Pilling Primary Care Provider: PATIENT, NO Other Clinician: Referring Provider: Treating Provider/Extender:Santiago Graf, [name] in Treatment: 6 Subjective History of Present Illness  (HPI) ADMISSION 05/09/2019 This is a 58 year old man with type 2 diabetes. He has had a previous right BKA done in [place] 4 years ago. Looking back through [place] Palmer this year he had a left fourth toe wound in January 2020. By looking at the pictures he had also had previous amputation of the first second and third toes as well as a right BKA. 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 Sharol Given on 09/28/2018. I believe at some point he dehisced the amputation as he had a wound VAC in early March. He was admitted to hospital from 10/04/2018 through 10/18/2018 with MSSA/strep sepsis. A TEE was negative. I do not believe during this admission he was felt to have osteomyelitis however an x-ray on 10/28/2018 showed concern for osteomyelitis  at the remaining second third and fourth metatarsals. An x-ray on 12/19/2018 showed progressive new bone formation at the transmetatarsal amputation sites of the second through fifth digits consistent with either acute or chronic osteomyelitis. It is hard to tell although after the discharge in March it looks as though he had a course of vancomycin and may be oral antibiotics although I am not sure about this or the duration of antibiotic therapy. The patient states that the wound opened up several months ago. It was clear that there was a wound on the plantar left foot on 12/27/2018. He was in the ER on 9/20 with a large wound on the left plantar foot given Silvadene. The patient states he has been putting bizarre combinations of things on this wound including castor oil. Past medical history; congestive heart failure, type 2 diabetes, bipolar disorder, right BKA, remote history of substance abuse, atrial fibrillation Arterial studies done on the left and 05/06/2018 showed an ABI of 1.21 with triphasic waveforms. 10/15; I did talk with Dr. Darron Palmer and asked her to order a CRP and sedimentation rate. Based on the lab work that she is  doing inflammatory markers white count etc. we will order an MRI of the left foot [see discussion above]. She actually stated that she has not really been following him regularly so was not really aware of what had been done in March. His wound actually looks somewhat better today and slightly smaller in terms of surface area 10/22; surprisingly the patient's labs that I ordered with Dr. Dennie Palmer assistance were not that bad. White count was 4.9 differential count normal. Sedimentation rate was 23 comprehensive metabolic panel was normal. Hemoglobin A1c 5.9 I thought I saw the C-reactive protein but I cannot put my finger on this right at the moment. His wound actually looks somewhat better measures better. He did however have some fluorescent green drainage 11/5; the patient's wound is come in a centimeter in width. We have been using silver alginate he has been changing the dressing himself.. We do not have a way to aggressively offload this as he will has a right leg prosthesis. Nursing still reports green drainage. I gave him antibiotics for this 11/19; the patient's wound came in again this time however he is complaining of pain in the inferior part of the wound on the left plantar foot. This is where he is undermining area is I have obtained a culture for CandS and I am going to give him empiric antibiotics. He has a complicated wound history including a right BKA and a left transmetatarsal amputation. The wound is roughly on the third met head remanent Objective Constitutional Sitting or standing Blood Pressure is within target range for patient.. Pulse regular and within target range for patient.Marland Kitchen Respirations regular, non-labored and within target range.. Temperature is normal and within the target range for the patient.Marland Kitchen Appears in no distress. Vitals Time Taken: 10:35 AM, Height: 76 in, Weight: 240 lbs, BMI: 29.2, Temperature: 98.0 F, Pulse: 77 bpm, Respiratory Rate: 18 breaths/min,  Blood Pressure: 139/65 mmHg. General Notes: Wound exam; the wound measures smaller however he had undermining with some purulent drainage. Tenderness as well. This was cultured. Extensive debridement of the rolled edges thick callus subcutaneous tissue removed from the wound circumference hemostasis with silver nitrate. Integumentary (Hair, Skin) Wound #1 status is Open. Original cause of wound was Gradually Appeared. The wound is located on the [place]. The wound measures 2.8cm length x 1.7cm width x 0.4cm depth; 3.738cm^2  area and 1.495cm^3 volume. There is Fat Layer (Subcutaneous Tissue) Exposed exposed. There is no tunneling noted, however, there is undermining starting at 11:00 and ending at 3:00 with a maximum distance of 0.4cm. There is a medium amount of serosanguineous drainage noted. The wound margin is thickened. There is large (67-100%) pink granulation within the wound bed. There is a small (1-33%) amount of necrotic tissue within the wound bed including Adherent Slough. 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 There was a Excisional Skin/Subcutaneous Tissue Debridement with a total area of 4.76 sq cm performed by [name]., MD. With the following instrument(s): Curette to remove Viable and Non-Viable tissue/material. Material removed includes Callus, Subcutaneous Tissue, Skin: Dermis, and Fibrin/Exudate after achieving pain control using Lidocaine 4% Topical Solution. 1 specimen was taken by a Swab and sent to the lab per facility protocol. A time out was conducted at 11:15, prior to the  start of the procedure. A Moderate amount of bleeding was controlled with Silver Nitrate. The procedure was tolerated well with a pain level of 1 throughout and a pain level of 3 following the procedure. Post Debridement Measurements: 2.8cm length x 1.7cm width x 0.4cm depth; 1.495cm^3 volume. Character of Wound/Ulcer Post Debridement is improved. Severity of Tissue Post Debridement is: Fat layer exposed. Post procedure Diagnosis Wound #1: Same as Pre-Procedure Plan Follow-up Appointments: Return Appointment in 1 week. - to see hoyt next week. Dressing Change Frequency: Wound #1 Left,Plantar Foot: Change Dressing every other day. Skin Barriers/Peri-Wound Care: Moisturizing lotion - to left leg daily. Wound Cleansing: Wound #1 Left,Plantar Foot: May shower and wash wound with soap and water. - with dressing changes. Primary Wound Dressing: Wound #1 Left,Plantar Foot: Calcium Alginate with Silver Secondary Dressing: Wound #1 Left,Plantar Foot: Kerlix/Rolled Gauze ABD pad Other: - felt callous pad Edema Control: Avoid standing for long periods of time Elevate legs to the level of the heart or above for 30 minutes daily and/or when sitting, a frequency of: - throughout the day. Additional Orders / Instructions: Stop/Decrease Smoking Follow Nutritious Diet - increase protein and vegetables in diabetic diet. Laboratory ordered were: Areobic culture-specimen not specified - culture of left foot wound. Radiology ordered were: X-ray, foot - x-ray left foot related to diabetic foot ulcer and foot pain. CPT code The following medication(s) was prescribed: doxycycline monohydrate oral 100 mg capsule 1 capsule oral bid for 7 days for wound infection left foot starting 06/20/2019 1. Culture done of drainage coming out of the lower part of the wound 2. Debridement extensively with a #5 curette removing thick circumference from around the wound margin 3. I do not know of another good way to  offload this. He says he is not on it much but that is a common statement from patient's 4. Empiric doxycycline awaiting culture of the left foot. 5. I went ahead and ordered an x-ray of this area/left foot Electronic Signature(s) Signed: 06/20/2019 11:33:22 AM By: [name] MD Entered By: [name] on 06/20/2019 11:33:22 -------------------------------------------------------------------------------- SuperBill Details Patient Name: Date of Service: Cory Palmer 06/20/2019 Medical Record [id number] Patient Account Number: [id number] Date of Birth/Sex: Treating RN: 06/13/1961 (58 y.o. 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, left ankle and foot Facility Procedures The patient participates with Medicare or their insurance follows the Medicare Facility Guidelines: CPT4 Code Description Modifier Quantity [id number] 11042 - DEB SUBQ TISSUE 20 SQ CM/< 1 ICD-10 Diagnosis Description E11.621 Type 2 diabetes mellitus with  foot ulcer Physician Procedures CPT4 Code: [id number] Description: 15947 - WC PHYS SUBQ TISS 20 SQ CM 1 ICD-10 Diagnosis Description E11.621 Type 2 diabetes mellitus with foot ulcer Modifier: Quantity: Electronic Signature(s) Signed: 06/20/2019 6:25:25 PM By: [name] MD Signed: 06/20/2019 6:38:14 PM By: Deon Pilling Entered By: Deon Pilling on 06/20/2019 11:22:59

## 2019-06-21 ENCOUNTER — Other Ambulatory Visit: Payer: Self-pay | Admitting: Internal Medicine

## 2019-06-21 ENCOUNTER — Other Ambulatory Visit (HOSPITAL_COMMUNITY): Payer: Self-pay | Admitting: Internal Medicine

## 2019-06-21 ENCOUNTER — Telehealth: Payer: Self-pay | Admitting: *Deleted

## 2019-06-21 DIAGNOSIS — L97509 Non-pressure chronic ulcer of other part of unspecified foot with unspecified severity: Secondary | ICD-10-CM

## 2019-06-21 DIAGNOSIS — E11621 Type 2 diabetes mellitus with foot ulcer: Secondary | ICD-10-CM

## 2019-06-24 NOTE — Progress Notes (Addendum)
Cory Palmer, Cory Palmer ([id number]) Visit Report for 06/20/2019 Arrival Information Details Patient Name: Date of Service: Cory Palmer, Cory Palmer 06/20/2019 10:00 AM Medical Record [id number] Patient Account Number: [id number] Date of Birth/Sex: Treating RN: 1961-01-19 (58 y.o. Cory Palmer Primary Care Cory Palmer: PATIENT, NO Other Clinician: Referring Cory Palmer: Treating Cory Palmer/Extender:Cory Palmer in Treatment: 6 Visit Information History Since Last Visit Cane Added or deleted any medications: No Patient Arrived: 10:32 Any new allergies or adverse reactions: No Arrival Time: alone Had a fall or experienced change in No Accompanied By: None activities of daily living that may affect Transfer Assistance: risk of falls: Patient Identification Verified: Yes Signs or symptoms of abuse/neglect since last No Secondary Verification Process Completed: Yes visito Patient Requires Transmission-Based No Hospitalized since last visit: No Precautions: Implantable device outside of the clinic excluding No Patient Has Alerts: No cellular tissue based products placed in the center since last visit: Has Dressing in Place as Prescribed: Yes Pain Present Now: Yes Electronic Signature(s) Signed: 06/24/2019 2:18:43 PM By: Cory Hurst RN, BSN Entered By: Cory Palmer on 06/20/2019 10:35:33 -------------------------------------------------------------------------------- Lower Extremity Assessment Details Patient Name: Date of Service: Cory Palmer, Cory Palmer 06/20/2019 10:00 AM Medical Record [id number] Patient Account Number: [id number] Date of Birth/Sex: Treating RN: February 22, 1961 (57 y.o. Cory Palmer Primary Care Cory Palmer: PATIENT, NO Other Clinician: Referring Cory Palmer: Treating Cory Palmer/Extender:Cory Palmer in Treatment: 6 Edema Assessment Assessed: [Left: No] [Right: No] Edema: [Left: Ye] [Right: s] Calf Left: Right: Point of Measurement: 43 cm From Medial  Instep 40 cm cm Ankle Left: Right: Point of Measurement: 10 cm From Medial Instep 26.4 cm cm Vascular Assessment Pulses: Dorsalis Pedis Palpable: [Left:Yes] Electronic Signature(s) Signed: 06/24/2019 2:18:43 PM By: Cory Hurst RN, BSN Entered By: Cory Palmer on 06/20/2019 10:37:49 -------------------------------------------------------------------------------- Multi Wound Chart Details Patient Name: Date of Service: Cory Palmer 06/20/2019 10:00 AM Medical Record [id number] Patient Account Number: [id number] Date of Birth/Sex: Treating RN: 1961/04/14 (58 y.o. Cory Palmer Primary Care Cory Palmer: PATIENT, NO Other Clinician: Referring Cory Palmer: Treating Cory Palmer/Extender:Cory Palmer in Treatment: 6 Vital Signs Height(in): 76 Pulse(bpm): 3 Weight(lbs): 240 Blood Pressure(mmHg): 139/65 Body Mass Index(BMI): 29 Temperature(F): 98.0 Respiratory 18 Rate(breaths/min): Photos: [1:No Photos] [N/A:N/A] Wound Location: [1:Left Foot - Plantar] [N/A:N/A] Wounding Event: [1:Gradually Appeared] [N/A:N/A] Primary Etiology: [1:Diabetic Wound/Ulcer of the N/A Lower Extremity] Comorbid History: [1:Hypertension, Type II Diabetes, Osteomyelitis] [N/A:N/A] Date Acquired: [1:05/04/2018] [N/A:N/A] Weeks of Treatment: [1:6] [N/A:N/A] Wound Status: [1:Open] [N/A:N/A] Measurements L x W x D 2.8x1.7x0.4 [N/A:N/A] (cm) Area (cm) : [1:3.738] [N/A:N/A] Volume (cm) : [1:1.495] [N/A:N/A] % Reduction in Area: [1:69.30%] [N/A:N/A] % Reduction in Volume: [1:38.50%] [N/A:N/A] Starting Position 1 [1:11] (o'clock): Ending Position 1 [1:3] (o'clock): Maximum Distance 1 [1:0.4] (cm): Undermining: [1:Yes] [N/A:N/A] Classification: [1:Grade 3] [N/A:N/A] Exudate Amount: [1:Medium] [N/A:N/A] Exudate Type: [1:Serosanguineous] [N/A:N/A] Exudate Color: [1:red, brown] [N/A:N/A] Wound Margin: [1:Thickened] [N/A:N/A] Granulation Amount: [1:Large (67-100%)]  [N/A:N/A] Granulation Quality: [1:Pink] [N/A:N/A] Necrotic Amount: [1:Small (1-33%)] [N/A:N/A] Exposed Structures: [1:Fat Layer (Subcutaneous Tissue) Exposed: Yes Fascia: No Tendon: No Muscle: No Joint: No Bone: No] [N/A:N/A] Epithelialization: [1:Small (1-33%)] [N/A:N/A] Debridement: [1:Debridement - Excisional] [N/A:N/A] Pre-procedure [1:11:15] [N/A:N/A] Verification/Time Out Taken: Pain Control: [1:Lidocaine 4% Topical Solution] [N/A:N/A] Tissue Debrided: [1:Callus, Subcutaneous] [N/A:N/A] Level: [1:Skin/Subcutaneous Tissue] [N/A:N/A] Debridement Area (sq cm):4.76 [N/A:N/A] Instrument: [1:Curette] [N/A:N/A] Specimen: [1:Swab] [N/A:N/A] Number of Specimens [1:1] [N/A:N/A] Taken: Bleeding: [1:Moderate] [N/A:N/A] Hemostasis Achieved: [1:Silver Nitrate] [N/A:N/A] Procedural Pain: [1:1] [N/A:N/A] Post Procedural Pain: [1:3] [N/A:N/A] Debridement Treatment Procedure was tolerated [N/A:N/A] Response: [1:well] Post Debridement [1:2.8x1.7x0.4] [N/A:N/A] Measurements L  x W x D (cm) Post Debridement [1:1.495] [N/A:N/A] Volume: (cm) Procedures Performed: Debridement [N/A:N/A] Treatment Notes Electronic Signature(s) Signed: 06/20/2019 6:25:25 PM By: Cory Ham MD Signed: 06/20/2019 6:38:14 PM By: Cory Palmer Entered By: Cory Palmer on 06/20/2019 11:26:59 -------------------------------------------------------------------------------- Multi-Disciplinary Care Plan Details Patient Name: Date of Service: Cory Palmer, Cory Palmer 06/20/2019 10:00 AM Medical Record [id number] Patient Account Number: [id number] Date of Birth/Sex: Treating RN: Oct 26, 1960 (58 y.o. Cory Palmer Primary Care Cory Palmer: PATIENT, NO Other Clinician: Referring Cory Palmer: Treating Cory Palmer/Extender:Cory Palmer in Treatment: 6 Active Inactive Nutrition Nursing Diagnoses: Impaired glucose control: actual or potential Potential for alteratiion in Nutrition/Potential for imbalanced  nutrition Goals: Patient/caregiver agrees to and verbalizes understanding of need to obtain nutritional consultation Date Initiated: 05/09/2019 Target Resolution Date: 06/28/2019 Goal Status: Active Patient/caregiver will maintain therapeutic glucose control Date Initiated: 05/09/2019 Target Resolution Date: 06/28/2019 Goal Status: Active Interventions: Assess HgA1c results as ordered upon admission and as needed Provide education on elevated blood sugars and impact on wound healing Provide education on nutrition Treatment Activities: Education provided on Nutrition : 06/06/2019 Patient referred to Primary Care Physician for further nutritional evaluation : 05/09/2019 Notes: Pain, Acute or Chronic Nursing Diagnoses: Pain, acute or chronic: actual or potential Potential alteration in comfort, pain Goals: Patient will verbalize adequate pain control and receive pain control interventions during procedures as needed Date Initiated: 05/09/2019 Target Resolution Date: 06/28/2019 Goal Status: Active Patient/caregiver will verbalize comfort level met Date Initiated: 05/09/2019 Target Resolution Date: 06/28/2019 Goal Status: Active Interventions: Complete pain assessment as per visit requirements Encourage patient to take pain medications as prescribed Provide education on pain management Reposition patient for comfort Treatment Activities: Administer pain control measures as ordered : 05/09/2019 Notes: Electronic Signature(s) Signed: 06/20/2019 6:38:14 PM By: Cory Palmer Entered By: Cory Palmer on 06/20/2019 11:03:25 -------------------------------------------------------------------------------- Pain Assessment Details Patient Name: Date of Service: Cory Palmer, Cory Palmer 06/20/2019 10:00 AM Medical Record [id number] Patient Account Number: [id number] Date of Birth/Sex: Treating RN: [birth year] (58 y.o. Cory Palmer Primary Care Kendyl Bissonnette: PATIENT, NO Other  Clinician: Referring Jaclyn Andy: Treating Kirin Brandenburger/Extender:Cory Palmer in Treatment: 6 Active Problems Location of Pain Severity and Description of Pain Patient Has Paino Yes Site Locations Pain Location: Pain in Ulcers With Dressing Change: Yes Rate the pain. Current Pain Level: 7 Character of Pain Describe the Pain: Throbbing Pain Management and Medication Current Pain Management: Medication: Yes Cold Application: No Rest: No Massage: No Activity: No T.E.N.S.: No Heat Application: No Leg drop or elevation: No Is the Current Pain Management Adequate: Adequate How does your wound impact your activities of daily livingo Sleep: No Bathing: No Appetite: No Relationship With Others: No Bladder Continence: No Emotions: No Bowel Continence: No Work: No Toileting: No Drive: No Dressing: No Hobbies: No Electronic Signature(s) Signed: 06/24/2019 2:18:43 PM By: Cory Hurst RN, BSN Entered By: Cory Palmer on 06/20/2019 10:35:52 -------------------------------------------------------------------------------- Patient/Caregiver Education Details Patient Name: Cory Palmer 11/19/2020andnbsp10:00 Date of Service: AM Medical Record [id number] Number: Patient Account Number: [id number] Treating RN: 1960-10-19 (58 y.o. Cory Palmer Date of Birth/Gender: M) Other Clinician: Primary Care Physician:PATIENT, NO Treating Cory Palmer Referring Physician: Physician/Extender: Suella Grove in Treatment: 6 Education Assessment Education Provided To: Patient Education Topics Provided Nutrition: Handouts: Nutrition Methods: Explain/Verbal Responses: Reinforcements needed Electronic Signature(s) Signed: 06/20/2019 6:38:14 PM By: Cory Palmer Entered By: Cory Palmer on 06/20/2019 11:03:37 -------------------------------------------------------------------------------- Wound Assessment Details Patient Name: Date of Service: Cory Palmer, Cory Palmer 06/20/2019 10:00  AM Medical Record [id number] Patient Account Number: [id number] Date of Birth/Sex: Treating RN: [birth year] (  58 y.o. Cory Palmer Primary Care Ursula Dermody: PATIENT, NO Other Clinician: Referring Alila Sotero: Treating Brynlea Spindler/Extender:Cory Palmer in Treatment: 6 Wound Status Wound Number: 1 Primary Diabetic Wound/Ulcer of the Lower Etiology: Extremity Wound Location: Left Foot - Plantar Wound Status: Open Wounding Event: Gradually Appeared Comorbid Hypertension, Type II Diabetes, Date Acquired: 05/04/2018 History: Osteomyelitis Weeks Of Treatment: 6 Clustered Wound: No Photos Wound Measurements Length: (cm) 2.8 Width: (cm) 1.7 Depth: (cm) 0.4 Area: (cm) 3.738 Volume: (cm) 1.495 % Reduction in Area: 69.3% % Reduction in Volume: 38.5% Epithelialization: Small (1-33%) Tunneling: No Undermining: Yes Starting Position (o'clock): 11 Ending Position (o'clock): 3 Maximum Distance: (cm) 0.4 Wound Description Classification: Grade 3 Foul Odor Wound Margin: Thickened Slough/Fi Exudate Amount: Medium Exudate Type: Serosanguineous Exudate Color: red, brown Wound Bed Granulation Amount: Large (67-100%) Granulation Quality: Pink Fascia Exp Necrotic Amount: Small (1-33%) Fat Layer Necrotic Quality: Adherent Slough Tendon Exp Muscle Exp Joint Expo Bone Expos After Cleansing: No brino Yes Exposed Structure osed: No (Subcutaneous Tissue) Exposed: Yes osed: No osed: No sed: No ed: No Electronic Signature(s) Signed: 06/26/2019 2:51:44 PM By: Mikeal Hawthorne EMT/HBOT Signed: 07/10/2019 12:09:06 PM By: Cory Hurst RN, BSN Previous Signature: 06/24/2019 2:18:43 PM Version By: Cory Hurst RN, BSN Entered By: Mikeal Hawthorne on 06/26/2019 08:08:42 -------------------------------------------------------------------------------- Scenic Details Patient Name: Date of Service: Cory Hashimoto B. 06/20/2019 10:00 AM Medical Record [id number] Patient Account  Number: [id number] Date of Birth/Sex: Treating RN: 11/26/1960 (58 y.o. Cory Palmer Primary Care Jahmya Onofrio: PATIENT, NO Other Clinician: Referring Jamillia Closson: Treating Andrue Dini/Extender:Cory Palmer in Treatment: 6 Vital Signs Time Taken: 10:35 Temperature (F): 98.0 Height (in): 76 Pulse (bpm): 77 Weight (lbs): 240 Respiratory Rate (breaths/min): 18 Body Mass Index (BMI): 29.2 Blood Pressure (mmHg): 139/65 Reference Range: 80 - 120 mg / dl Electronic Signature(s) Signed: 06/24/2019 2:18:43 PM By: Cory Hurst RN, BSN Entered By: Cory Palmer on 06/20/2019 10:35:25

## 2019-06-25 LAB — AEROBIC/ANAEROBIC CULTURE W GRAM STAIN (SURGICAL/DEEP WOUND): Gram Stain: NONE SEEN

## 2019-06-25 LAB — AEROBIC/ANAEROBIC CULTURE (SURGICAL/DEEP WOUND)

## 2019-07-02 ENCOUNTER — Other Ambulatory Visit (HOSPITAL_COMMUNITY): Payer: Self-pay | Admitting: Internal Medicine

## 2019-07-02 DIAGNOSIS — L97509 Non-pressure chronic ulcer of other part of unspecified foot with unspecified severity: Secondary | ICD-10-CM

## 2019-07-02 DIAGNOSIS — E11621 Type 2 diabetes mellitus with foot ulcer: Secondary | ICD-10-CM

## 2019-07-08 ENCOUNTER — Encounter (HOSPITAL_BASED_OUTPATIENT_CLINIC_OR_DEPARTMENT_OTHER): Payer: Medicare Other | Attending: Internal Medicine | Admitting: Internal Medicine

## 2019-07-08 ENCOUNTER — Other Ambulatory Visit: Payer: Self-pay

## 2019-07-08 DIAGNOSIS — E1142 Type 2 diabetes mellitus with diabetic polyneuropathy: Secondary | ICD-10-CM | POA: Insufficient documentation

## 2019-07-08 DIAGNOSIS — E11621 Type 2 diabetes mellitus with foot ulcer: Secondary | ICD-10-CM | POA: Diagnosis present

## 2019-07-08 DIAGNOSIS — M86672 Other chronic osteomyelitis, left ankle and foot: Secondary | ICD-10-CM | POA: Diagnosis not present

## 2019-07-08 DIAGNOSIS — E1169 Type 2 diabetes mellitus with other specified complication: Secondary | ICD-10-CM | POA: Diagnosis not present

## 2019-07-08 DIAGNOSIS — Z89511 Acquired absence of right leg below knee: Secondary | ICD-10-CM | POA: Diagnosis not present

## 2019-07-08 DIAGNOSIS — L97422 Non-pressure chronic ulcer of left heel and midfoot with fat layer exposed: Secondary | ICD-10-CM | POA: Insufficient documentation

## 2019-07-08 DIAGNOSIS — Z89432 Acquired absence of left foot: Secondary | ICD-10-CM | POA: Insufficient documentation

## 2019-07-09 NOTE — Progress Notes (Signed)
Cory, Palmer ([id number]) Visit Report for 06/06/2019 Arrival Information Details Patient Name: Date of Service: Cory Palmer, Cory Palmer 06/06/2019 12:45 PM Medical Record [id number] Patient Account Number: [id number] Date of Birth/Sex: Treating RN: June 06, 1961 (58 y.o. Lorette Ang, Meta.Reding Primary Care Cailyn Houdek: PATIENT, NO Other Clinician: Referring Solymar Grace: Treating Kenedy Haisley/Extender:Robson, [name] in Treatment: 4 Visit Information History Since Last Visit Cane Added or deleted any medications: No Patient Arrived: 13:37 Any new allergies or adverse reactions: No Arrival Time: Had a fall or experienced change in No Accompanied By: self None activities of daily living that may affect Transfer Assistance: risk of falls: Patient Identification Verified: Yes Signs or symptoms of abuse/neglect since last No Secondary Verification Process Completed: Yes visito Patient Requires Transmission-Based No Hospitalized since last visit: No Precautions: Implantable device outside of the clinic excluding No Patient Has Alerts: No cellular tissue based products placed in the center since last visit: Has Dressing in Place as Prescribed: Yes Pain Present Now: Yes Electronic Signature(s) Signed: 07/09/2019 3:00:22 PM By: Sandre Kitty Entered By: Sandre Kitty on 06/06/2019 13:37:51 -------------------------------------------------------------------------------- Clinic Level of Care Assessment Details Patient Name: Date of Service: Cory, Palmer 06/06/2019 12:45 PM Medical Record [id number] Patient Account Number: [id number] Date of Birth/Sex: Treating RN: 1961-01-25 (58 y.o. Marvis Repress Primary Care Lamonta Cypress: PATIENT, NO Other Clinician: Referring Nazareth Norenberg: Treating Jasalyn Frysinger/Extender:Robson, [name] in Treatment: 4 Clinic Level of Care Assessment Items TOOL 4 Quantity Score X - Use when only an EandM is performed on FOLLOW-UP visit 1 0 ASSESSMENTS -  Nursing Assessment / Reassessment X - Reassessment of Co-morbidities (includes updates in patient status) 1 10 X - Reassessment of Adherence to Treatment Plan 1 5 ASSESSMENTS - Wound and Skin Assessment / Reassessment X - Simple Wound Assessment / Reassessment - one wound 1 5 _0  - Complex Wound Assessment / Reassessment - multiple wounds 0 _1  - Dermatologic / Skin Assessment (not related to wound area) 0 ASSESSMENTS - Focused Assessment X - Circumferential Edema Measurements - multi extremities 1 5 _2  - Nutritional Assessment / Counseling / Intervention 0 _3  - Lower Extremity Assessment (monofilament, tuning fork, pulses) 0 _4  - Peripheral Arterial Disease Assessment (using hand held doppler) 0 ASSESSMENTS - Ostomy and/or Continence Assessment and Care _5  - Incontinence Assessment and Management 0 _6  - Ostomy Care Assessment and Management (repouching, etc.) 0 PROCESS - Coordination of Care X - Simple Patient / Family Education for ongoing care 1 15 _7  - Complex (extensive) Patient / Family Education for ongoing care 0 X - Staff obtains Programmer, systems, Records, Test Results / Process Orders 1 10 _8  - Staff telephones HHA, Nursing Homes / Clarify orders / etc 0 _9  - Routine Transfer to another Facility (non-emergent condition) 0 _10  - Routine Hospital Admission (non-emergent condition) 0 _11  - New Admissions / Biomedical engineer / Ordering NPWT, Apligraf, etc. 0 _12  - Emergency Hospital Admission (emergent condition) 0 X - Simple Discharge Coordination 1 10 _13  - Complex (extensive) Discharge Coordination 0 PROCESS - Special Needs _14  - Pediatric / Minor Patient Management 0 _15  - Isolation Patient Management 0 _16  - Hearing / Language / Visual special needs 0 _17  - Assessment of Community assistance (transportation, D/C planning, etc.) 0 _18  - Additional assistance / Altered mentation 0 _19  - Support Surface(s) Assessment (bed, cushion, seat, etc.) 0 INTERVENTIONS - Wound Cleansing /  Measurement X - Simple Wound Cleansing - one wound 1 5 _20  - Complex Wound Cleansing - multiple wounds 0 X - Wound Imaging (photographs - any number of  wounds) 1 5 _0  - Wound Tracing (instead of photographs) 0 X - Simple Wound Measurement - one wound 1 5 _1  - Complex Wound Measurement - multiple wounds 0 INTERVENTIONS - Wound Dressings X - Small Wound Dressing one or multiple wounds 1 10 _2  - Medium Wound Dressing one or multiple wounds 0 _3  - Large Wound Dressing one or multiple wounds 0 X - Application of Medications - topical 1 5 <[id number]>_7<\/[id number]>_3  - Application of Medications - injection 0 INTERVENTIONS - Miscellaneous _5  - External ear exam 0 _6  - Specimen Collection (cultures, biopsies, blood, body fluids, etc.) 0 _7  - Specimen(s) / Culture(s) sent or taken to Lab for analysis 0 _8  - Patient Transfer (multiple staff / Civil Service fast streamer / Similar devices) 0 _9  - Simple Staple / Suture removal (25 or less) 0 _10  - Complex Staple / Suture removal (26 or more) 0 _11  - Hypo / Hyperglycemic Management (close monitor of Blood Glucose) 0 _12  - Ankle / Brachial Index (ABI) - do not check if billed separately 0 X - Vital Signs 1 5 Has the patient been seen at the hospital within the last three years: Yes Total Score: 95 Level Of Care: New/Established - Level 3 Electronic Signature(s) Signed: 06/06/2019 5:16:49 PM By: Kela Millin Entered By: Kela Millin on 06/06/2019 14:09:37 -------------------------------------------------------------------------------- Encounter Discharge Information Details Patient Name: Date of Service: [name] 06/06/2019 12:45 PM Medical Record [id number] Patient Account Number: [id number] Date of Birth/Sex: Treating RN: Mar 26, 1961 (58 y.o. [name] Primary Care Lyndsie Wallman: PATIENT, NO Other Clinician: Referring Veyda Kaufman: Treating Analia Zuk/Extender:Robson, [name] in Treatment: 4 Encounter Discharge Information Items Discharge Condition:  Stable Ambulatory Status: Cane Discharge Destination: Home Transportation: Other Accompanied By: self Schedule Follow-up Appointment: Yes Clinical Summary of Care: Patient Declined Notes transportation service Electronic Signature(s) Signed: 06/06/2019 6:27:24 PM By: Baruch Gouty RN, BSN Entered By: Baruch Gouty on 06/06/2019 14:30:49 -------------------------------------------------------------------------------- Lower Extremity Assessment Details Patient Name: Date of Service: MOHSEN, ODENTHAL 06/06/2019 12:45 PM Medical Record [id number] Patient Account Number: [id number] Date of Birth/Sex: Treating RN: 1961/06/10 (58 y.o. [name] Primary Care Jaqua Ching: PATIENT, NO Other Clinician: Referring Shavar Gorka: Treating Juliet Vasbinder/Extender:Robson, [name] in Treatment: 4 Edema Assessment Assessed: [Left: No] [Right: No] Edema: [Left: Ye] [Right: s] Calf Left: Right: Point of Measurement: 43 cm From Medial Instep 36 cm cm Ankle Left: Right: Point of Measurement: 10 cm From Medial Instep 26 cm cm Vascular Assessment Pulses: Dorsalis Pedis Palpable: [Left:Yes] Electronic Signature(s) Signed: 06/10/2019 5:46:53 PM By: Levan Hurst RN, BSN Entered By: Levan Hurst on 06/06/2019 13:50:51 -------------------------------------------------------------------------------- Multi Wound Chart Details Patient Name: Date of Service: [name] 06/06/2019 12:45 PM Medical Record [id number] Patient Account Number: [id number] Date of Birth/Sex: Treating RN: [birth year] (58 y.o. [name] Primary Care Jalah Warmuth: PATIENT, NO Other Clinician: Referring Christyna Letendre: Treating Christine Schiefelbein/Extender:Robson, [name] in Treatment: 4 Vital Signs Height(in): 76 Pulse(bpm): 33 Weight(lbs): 240 Blood Pressure(mmHg): 121/82 Body Mass Index(BMI): 29 Temperature(F): 98.2 Respiratory 16 Rate(breaths/min): Photos: [1:No Photos] [N/A:N/A] Wound Location:  [1:Left Foot - Plantar] [N/A:N/A] Wounding Event: [1:Gradually Appeared] [N/A:N/A] Primary Etiology: [1:Diabetic Wound/Ulcer of the N/A Lower Extremity] Comorbid History: [1:Hypertension, Type II Diabetes, Osteomyelitis] [N/A:N/A] Date Acquired: [1:05/04/2018] [N/A:N/A] Weeks of Treatment: [1:4] [N/A:N/A] Wound Status: [1:Open] [N/A:N/A] Measurements L x W x D 3.2x1.9x0.3 [N/A:N/A] (cm) Area (cm) : [1:4.775] [N/A:N/A] Volume (cm) : [1:1.433] [N/A:N/A] % Reduction in Area: [1:60.70%] [N/A:N/A] % Reduction in Volume: 41.10% [N/A:N/A] Starting Position 1 12 (o'clock): Ending Position 1 [1:12] (o'clock): Maximum Distance 1 [1:0.3] (  cm): Undermining: [1:Yes] [N/A:N/A] Classification: [1:Grade 3] [N/A:N/A] Exudate Amount: [1:Large] [N/A:N/A] Exudate Type: [1:Purulent] [N/A:N/A] Exudate Color: [1:yellow, brown, green] [N/A:N/A] Wound Margin: [1:Thickened] [N/A:N/A] Granulation Amount: [1:Large (67-100%)] [N/A:N/A] Granulation Quality: [1:Pink] [N/A:N/A] Necrotic Amount: [1:None Present (0%)] [N/A:N/A] Exposed Structures: [1:Fat Layer (Subcutaneous Tissue) Exposed: Yes Fascia: No Tendon: No Muscle: No Joint: No Bone: No None] [N/A:N/A N/A] Treatment Notes Electronic Signature(s) Signed: 06/06/2019 5:30:28 PM By: Deon Pilling Signed: 06/06/2019 5:45:12 PM By: [name] MD Entered By: [name] on 06/06/2019 14:10:37 -------------------------------------------------------------------------------- Multi-Disciplinary Care Plan Details Patient Name: Date of Service: VLADIMIR, LENHOFF 06/06/2019 12:45 PM Medical Record [id number] Patient Account Number: [id number] Date of Birth/Sex: Treating RN: [birth year] (58 y.o. Marvis Repress Primary Care Vernis Eid: PATIENT, NO Other Clinician: Referring Lisa Milian: Treating Aasha Dina/Extender:Robson, [name] in Treatment: 4 Active Inactive Nutrition Nursing Diagnoses: Impaired glucose control: actual or  potential Potential for alteratiion in Nutrition/Potential for imbalanced nutrition Goals: Patient/caregiver agrees to and verbalizes understanding of need to obtain nutritional consultation Date Initiated: 05/09/2019 Target Resolution Date: 06/28/2019 Goal Status: Active Patient/caregiver will maintain therapeutic glucose control Date Initiated: 05/09/2019 Target Resolution Date: 06/28/2019 Goal Status: Active Interventions: Assess HgA1c results as ordered upon admission and as needed Provide education on elevated blood sugars and impact on wound healing Provide education on nutrition Treatment Activities: Patient referred to Primary Care Physician for further nutritional evaluation : 05/09/2019 Notes: Pain, Acute or Chronic Nursing Diagnoses: Pain, acute or chronic: actual or potential Potential alteration in comfort, pain Goals: Patient will verbalize adequate pain control and receive pain control interventions during procedures as needed Date Initiated: 05/09/2019 Target Resolution Date: 06/28/2019 Goal Status: Active Patient/caregiver will verbalize comfort level met Date Initiated: 05/09/2019 Target Resolution Date: 06/28/2019 Goal Status: Active Interventions: Complete pain assessment as per visit requirements Encourage patient to take pain medications as prescribed Provide education on pain management Reposition patient for comfort Treatment Activities: Administer pain control measures as ordered : 05/09/2019 Notes: Wound/Skin Impairment Nursing Diagnoses: Knowledge deficit related to ulceration/compromised skin integrity Goals: Patient/caregiver will verbalize understanding of skin care regimen Date Initiated: 05/09/2019 Target Resolution Date: 06/28/2019 Goal Status: Active Interventions: Assess patient/caregiver ability to perform ulcer/skin care regimen upon admission and as needed Assess ulceration(s) every visit Provide education on ulcer and skin  care Treatment Activities: Skin care regimen initiated : 05/09/2019 Topical wound management initiated : 05/09/2019 Notes: Electronic Signature(s) Signed: 06/06/2019 5:16:49 PM By: Kela Millin Entered By: Kela Millin on 06/06/2019 13:54:53 -------------------------------------------------------------------------------- Pain Assessment Details Patient Name: Date of Service: ZEEK, ROSTRON 06/06/2019 12:45 PM Medical Record [id number] Patient Account Number: [id number] Date of Birth/Sex: Treating RN: 09-Nov-1960 (58 y.o. [name] Primary Care Khiem Gargis: PATIENT, NO Other Clinician: Referring Jarmal Lewelling: Treating Karisma Meiser/Extender:Robson, [name] in Treatment: 4 Active Problems Location of Pain Severity and Description of Pain Patient Has Paino Yes Site Locations Rate the pain. Current Pain Level: 5 Pain Management and Medication Current Pain Management: Electronic Signature(s) Signed: 06/06/2019 5:30:28 PM By: Deon Pilling Signed: 07/09/2019 3:00:22 PM By: Sandre Kitty Entered By: Sandre Kitty on 06/06/2019 13:39:55 -------------------------------------------------------------------------------- Patient/Caregiver Education Details Patient Name: Date of Service: [name] 11/5/2020andnbsp12:45 PM Medical Record Patient Account Number: [id number] Number: Treating RN: Kela Millin [birth year] (58 y.o. Other Clinician: Date of Birth/Gender: M) Treating [name] Primary Care Physician: PATIENT, NO Physician/Extender: Referring Physician: Suella Grove in Treatment: 4 Education Assessment Education Provided To: Patient Education Topics Provided Elevated Blood Sugar/ Impact on Healing: Handouts: Elevated Blood Sugars: How Do They Affect Wound Healing Methods: Explain/Verbal  Responses: State content correctly Nutrition: Handouts: Elevated Blood Sugars: How Do They Affect Wound Healing Methods:  Explain/Verbal Responses: State content correctly Pain: Handouts: A Guide to Pain Control Methods: Explain/Verbal Responses: State content correctly Wound/Skin Impairment: Handouts: Caring for Your Ulcer Methods: Explain/Verbal Responses: State content correctly Electronic Signature(s) Signed: 06/06/2019 5:16:49 PM By: Kela Millin Entered By: Kela Millin on 06/06/2019 13:55:41 -------------------------------------------------------------------------------- Wound Assessment Details Patient Name: Date of Service: WILLI, BOROWIAK 06/06/2019 12:45 PM Medical Record [id number] Patient Account Number: [id number] Date of Birth/Sex: Treating RN: 1961/03/22 (58 y.o. [name] Primary Care Colton Engdahl: PATIENT, NO Other Clinician: Referring Samael Blades: Treating Rosalynn Sergent/Extender:Robson, [name] in Treatment: 4 Wound Status Wound Number: 1 Primary Diabetic Wound/Ulcer of the Lower Etiology: Extremity Wound Location: Left Foot - Plantar Wound Status: Open Wounding Event: Gradually Appeared Comorbid Hypertension, Type II Diabetes, Date Acquired: 05/04/2018 History: Osteomyelitis Weeks Of Treatment: 4 Clustered Wound: No Photos Wound Measurements Length: (cm) 3.2 Width: (cm) 1.9 Depth: (cm) 0.3 Area: (cm) 4.775 Volume: (cm) 1.433 % Reduction in Area: 60.7% % Reduction in Volume: 41.1% Epithelialization: None Undermining: Yes Starting Position (o'clock): 12 Ending Position (o'clock): 12 Maximum Distance: (cm) 0.3 Wound Description Classification: Grade 3 Wound Margin: Thickened Exudate Amount: Large Exudate Type: Purulent Exudate Color: yellow, brown, green Wound Bed Granulation Amount: Large (67-100%) Granulation Quality: Pink Necrotic Amount: None Present (0%) Foul Odor After Cleansing: No Slough/Fibrino No Exposed Structure Fascia Exposed: No Fat Layer (Subcutaneous Tissue) Exposed: Yes Tendon Exposed: No Muscle Exposed: No Joint  Exposed: No Bone Exposed: No Electronic Signature(s) Signed: 06/07/2019 5:26:58 PM By: Deon Pilling Signed: 06/10/2019 4:06:15 PM By: Mikeal Hawthorne EMT/HBOT Previous Signature: 06/06/2019 5:30:28 PM Version By: Deon Pilling Entered By: Mikeal Hawthorne on 06/07/2019 12:22:05 -------------------------------------------------------------------------------- Vitals Details Patient Name: Date of Service: [name] 06/06/2019 12:45 PM Medical Record [id number] Patient Account Number: [id number] Date of Birth/Sex: Treating RN: 1961/05/27 (58 y.o. [name] Primary Care Abimael Zeiter: PATIENT, NO Other Clinician: Referring Javanni Maring: Treating Domenique Southers/Extender:Robson, [name] in Treatment: 4 Vital Signs Time Taken: 13:39 Temperature (F): 98.2 Height (in): 76 Pulse (bpm): 94 Weight (lbs): 240 Respiratory Rate (breaths/min): 16 Body Mass Index (BMI): 29.2 Blood Pressure (mmHg): 121/82 Reference Range: 80 - 120 mg / dl Electronic Signature(s) Signed: 07/09/2019 3:00:22 PM By: Sandre Kitty Entered By: Sandre Kitty on 06/06/2019 13:39:47

## 2019-07-09 NOTE — Progress Notes (Signed)
Cory Palmer ([id number]) Visit Report for 05/09/2019 Allergy List Details Patient Name: Date of Service: Cory Palmer 05/09/2019 10:30 AM Medical Record [id number] Patient Account Number: [id number] Date of Birth/Sex: Treating RN: [birth year] (57 y.o. Jerilynn Mages) Carlene Coria Primary Care Robena Ewy: PATIENT, NO Other Clinician: Sandre Kitty Referring Elisa Sorlie: Treating Zadin Lange/Extender:Robson, [name] in Treatment: 0 Allergies Active Allergies tramadol Allergy Notes Electronic Signature(s) Signed: 07/09/2019 2:59:38 PM By: Carlene Coria RN Entered By: Carlene Coria on 05/09/2019 09:52:35 -------------------------------------------------------------------------------- Arrival Information Details Patient Name: Date of Service: Cory Palmer 05/09/2019 10:30 AM Medical Record [id number] Patient Account Number: [id number] Date of Birth/Sex: Treating RN: August 24, 1960 (57 y.o. Jerilynn Mages) Carlene Coria Primary Care Lowanda Cashaw: PATIENT, NO Other Clinician: Sandre Kitty Referring Laportia Carley: Treating Keandre Linden/Extender:Robson, [name] in Treatment: 0 Visit Information Patient Arrived: Cane Arrival Time: 09:51 Accompanied By: self Transfer Assistance: None Patient Identification Verified: Yes Secondary Verification Process Completed: Yes Patient Requires Transmission-Based No Precautions: Patient Has Alerts: No Electronic Signature(s) Signed: 07/09/2019 2:59:38 PM By: Carlene Coria RN Entered By: Carlene Coria on 05/09/2019 09:51:25 -------------------------------------------------------------------------------- Clinic Level of Care Assessment Details Patient Name: Date of Service: Cory Palmer, Cory Palmer 05/09/2019 10:30 AM Medical Record [id number] Patient Account Number: [id number] Date of Birth/Sex: Treating RN: [birth year] (58 y.o. [name] Primary Care Charley Lafrance: PATIENT, NO Other Clinician: Sandre Kitty Referring Marlyce Mcdougald: Treating  Justis Dupas/Extender:Robson, [name] in Treatment: 0 Clinic Level of Care Assessment Items TOOL 1 Quantity Score X - Use when EandM and Procedure is performed on INITIAL visit 1 0 ASSESSMENTS - Nursing Assessment / Reassessment X - General Physical Exam (combine w/ comprehensive assessment (listed just below) 1 20 when performed on new pt. evals) X - Comprehensive Assessment (HX, ROS, Risk Assessments, Wounds Hx, etc.) 1 25 ASSESSMENTS - Wound and Skin Assessment / Reassessment X - Dermatologic / Skin Assessment (not related to wound area) 1 10 ASSESSMENTS - Ostomy and/or Continence Assessment and Care _0  - Incontinence Assessment and Management 0 _1  - Ostomy Care Assessment and Management (repouching, etc.) 0 PROCESS - Coordination of Care X - Simple Patient / Family Education for ongoing care 1 15 _2  - Complex (extensive) Patient / Family Education for ongoing care 0 X - Staff obtains Consents, Records, Test Results / Process Orders 1 10 _3  - Staff telephones HHA, Nursing Homes / Clarify orders / etc 0 _4  - Routine Transfer to another Facility (non-emergent condition) 0 _5  - Routine Hospital Admission (non-emergent condition) 0 X - New Admissions / Biomedical engineer / Ordering NPWT, Apligraf, etc. 1 15 _6  - Emergency Hospital Admission (emergent condition) 0 PROCESS - Special Needs _7  - Pediatric / Minor Patient Management 0 _8  - Isolation Patient Management 0 _9  - Hearing / Language / Visual special needs 0 _10  - Assessment of Community assistance (transportation, D/C planning, etc.) 0 _11  - Additional assistance / Altered mentation 0 _12  - Support Surface(s) Assessment (bed, cushion, seat, etc.) 0 INTERVENTIONS - Miscellaneous _13  - External ear exam 0 _14  - Patient Transfer (multiple staff / Civil Service fast streamer / Similar devices) 0 _15  - Simple Staple / Suture removal (25 or less) 0 _16  - Complex Staple / Suture removal (26 or more) 0 _17  - Hypo/Hyperglycemic Management (do not  check if billed separately) 0 X - Ankle / Brachial Index (ABI) - do not check if billed separately 1 15 Has the patient been seen at the hospital within the last three years: Yes Total Score: 110 Level Of Care: New/Established - Level 3 Electronic Signature(s) Signed: 05/09/2019 6:06:48 PM By:  Deaton, Bobbi Entered By: Deon Pilling on 05/09/2019 11:01:46 -------------------------------------------------------------------------------- Encounter Discharge Information Details Patient Name: Date of Service: Cory Palmer, Cory Palmer 05/09/2019 10:30 AM Medical Record [id number] Patient Account Number: [id number] Date of Birth/Sex: Treating RN: 06/03/1961 (57 y.o. Jerilynn Mages) Carlene Coria Primary Care Gwendolen Hewlett: PATIENT, NO Other Clinician: Sandre Kitty Referring Anelisse Jacobson: Treating Karam Dunson/Extender:Robson, [name] in Treatment: 0 Encounter Discharge Information Items Post Procedure Vitals Discharge Condition: Stable Temperature (F): 97.6 Ambulatory Status: Cane Pulse (bpm): 70 Discharge Destination: Home Respiratory Rate (breaths/min): 18 Transportation: Other Blood Pressure (mmHg): 127/70 Accompanied By: self Schedule Follow-up Appointment: Yes Clinical Summary of Care: Patient Declined Electronic Signature(s) Signed: 07/09/2019 2:59:38 PM By: Carlene Coria RN Entered By: Carlene Coria on 05/09/2019 11:25:25 -------------------------------------------------------------------------------- Lower Extremity Assessment Details Patient Name: Date of Service: Cory Palmer, Cory Palmer 05/09/2019 10:30 AM Medical Record [id number] Patient Account Number: [id number] Date of Birth/Sex: Treating RN: 05-07-1961 (57 y.o. Jerilynn Mages) Carlene Coria Primary Care Pearlean Sabina: PATIENT, NO Other Clinician: Sandre Kitty Referring Abdulaziz Toman: Treating Sherrick Araki/Extender:Robson, [name] in Treatment: 0 Edema Assessment Assessed: [Left: Yes] [Right: No] Edema: [Left: Ye] [Right: s] Calf Left: Right: Point of  Measurement: 43 cm From Medial Instep 39 cm cm Ankle Left: Right: Point of Measurement: 10 cm From Medial Instep 27 cm cm Vascular Assessment Blood Pressure: Brachial: [Left:127] Ankle: [Left:Dorsalis Pedis: 152 1.20] Electronic Signature(s) Signed: 07/09/2019 2:59:38 PM By: Carlene Coria RN Entered By: Carlene Coria on 05/09/2019 10:13:59 -------------------------------------------------------------------------------- Multi Wound Chart Details Patient Name: Date of Service: Cory Palmer 05/09/2019 10:30 AM Medical Record [id number] Patient Account Number: [id number] Date of Birth/Sex: Treating RN: Apr 28, 1961 (58 y.o. [name] Primary Care Ritter Helsley: PATIENT, NO Other Clinician: Sandre Kitty Referring Eileen Kangas: Treating Tayshawn Purnell/Extender:Robson, [name] in Treatment: 0 Vital Signs Height(in): 76 Pulse(bpm): 70 Weight(lbs): 240 Blood Pressure(mmHg): 127/70 Body Mass Index(BMI): 29 Temperature(F): 97.6 Respiratory 18 Rate(breaths/min): Photos: [1:No Photos] [N/A:N/A] Wound Location: [1:Left Foot - Plantar] [N/A:N/A] Wounding Event: [1:Gradually Appeared] [N/A:N/A] Primary Etiology: [1:Diabetic Wound/Ulcer of the N/A Lower Extremity] Comorbid History: [1:Hypertension, Type II Diabetes, Osteomyelitis] [N/A:N/A] Date Acquired: [1:05/04/2018] [N/A:N/A] Weeks of Treatment: [1:0] [N/A:N/A] Wound Status: [1:Open] [N/A:N/A] Measurements L x W x D 4.3x3.6x0.2 [N/A:N/A] (cm) Area (cm) : [1:12.158] [N/A:N/A] Volume (cm) : [1:2.432] [N/A:N/A] % Reduction in Area: [1:0.00%] [N/A:N/A] % Reduction in Volume: 0.00% [N/A:N/A] Starting Position 1 [1:3] (o'clock): Ending Position 1 [1:9] (o'clock): Maximum Distance 1 [1:0.3] (cm): Undermining: [1:Yes] [N/A:N/A] Classification: [1:Grade 3] [N/A:N/A] Wagner Verification: [1:X-Ray] [N/A:N/A] Exudate Amount: [1:Medium] [N/A:N/A] Exudate Type: [1:Serosanguineous] [N/A:N/A] Exudate Color: [1:red, brown]  [N/A:N/A] Wound Margin: [1:Thickened] [N/A:N/A] Granulation Amount: [1:Large (67-100%)] [N/A:N/A] Granulation Quality: [1:Red] [N/A:N/A] Necrotic Amount: [1:None Present (0%)] [N/A:N/A] Exposed Structures: [1:Fat Layer (Subcutaneous N/A Tissue) Exposed: Yes Fascia: No Tendon: No Muscle: No Joint: No Bone: No] Epithelialization: [1:None] [N/A:N/A] Debridement: [1:Debridement - Excisional N/A] Pre-procedure [1:10:50] [N/A:N/A] Verification/Time Out Taken: Pain Control: [1:Lidocaine 4% Topical Solution] [N/A:N/A] Tissue Debrided: [1:Subcutaneous, Slough] [N/A:N/A] Level: [1:Skin/Subcutaneous Tissue N/A] Debridement Area (sq cm):15.05 [N/A:N/A] Instrument: [1:Curette] [N/A:N/A] Bleeding: [1:Minimum] [N/A:N/A] Hemostasis Achieved: [1:Pressure] [N/A:N/A] Procedural Pain: [1:1] [N/A:N/A] Post Procedural Pain: [1:3] [N/A:N/A] Debridement Treatment [1:Procedure was tolerated] Response: [1:well] Post Debridement [1:4.3x3.5x0.2] Measurements L x W x D (cm) Post Debridement [1:2.364] Volume: (cm) Procedures Performed: [1:Debridement] Treatment Notes Wound #1 (Left, Plantar Foot) 1. Cleanse With Wound Cleanser 2. Periwound Care Moisturizing lotion 3. Primary Dressing Applied Calcium Alginate Ag 4. Secondary Dressing ABD Pad Roll Gauze 5. Secured With Tape Notes felt to periwound Electronic Signature(s) Signed: 05/09/2019 5:49:31 PM By: [name] MD Signed:  05/09/2019 6:06:48 PM By: Deon Pilling Entered By: [name] on 05/09/2019 13:08:02 -------------------------------------------------------------------------------- Multi-Disciplinary Care Plan Details Patient Name: Date of Service: Cory Palmer, Cory Palmer 05/09/2019 10:30 AM Medical Record [id number] Patient Account Number: [id number] Date of Birth/Sex: Treating RN: [birth year] (58 y.o. [name] Primary Care Dailyn Kempner: PATIENT, NO Other Clinician: Sandre Kitty Referring Unique Searfoss: Treating  Khy Pitre/Extender:Robson, [name] in Treatment: 0 Active Inactive Nutrition Nursing Diagnoses: Impaired glucose control: actual or potential Potential for alteratiion in Nutrition/Potential for imbalanced nutrition Goals: Patient/caregiver agrees to and verbalizes understanding of need to obtain nutritional consultation Date Initiated: 05/09/2019 Target Resolution Date: 06/07/2019 Goal Status: Active Patient/caregiver will maintain therapeutic glucose control Date Initiated: 05/09/2019 Target Resolution Date: 06/07/2019 Goal Status: Active Interventions: Assess HgA1c results as ordered upon admission and as needed Provide education on elevated blood sugars and impact on wound healing Provide education on nutrition Treatment Activities: Patient referred to Primary Care Physician for further nutritional evaluation : 05/09/2019 Notes: Orientation to the Wound Care Program Nursing Diagnoses: Knowledge deficit related to the wound healing center program Goals: Patient/caregiver will verbalize understanding of the [place] [facility] Date Initiated: 05/09/2019 Target Resolution Date: 06/07/2019 Goal Status: Active Interventions: Provide education on orientation to the wound center Notes: Pain, Acute or Chronic Nursing Diagnoses: Pain, acute or chronic: actual or potential Potential alteration in comfort, pain Goals: Patient will verbalize adequate pain control and receive pain control interventions during procedures as needed Date Initiated: 05/09/2019 Target Resolution Date: 06/07/2019 Goal Status: Active Patient/caregiver will verbalize comfort level met Date Initiated: 05/09/2019 Target Resolution Date: 06/07/2019 Goal Status: Active Interventions: Complete pain assessment as per visit requirements Encourage patient to take pain medications as prescribed Provide education on pain management Reposition patient for comfort Treatment Activities: Administer pain  control measures as ordered : 05/09/2019 Notes: Wound/Skin Impairment Nursing Diagnoses: Knowledge deficit related to ulceration/compromised skin integrity Goals: Patient/caregiver will verbalize understanding of skin care regimen Date Initiated: 05/09/2019 Target Resolution Date: 06/07/2019 Goal Status: Active Interventions: Assess patient/caregiver ability to perform ulcer/skin care regimen upon admission and as needed Assess ulceration(s) every visit Provide education on ulcer and skin care Treatment Activities: Skin care regimen initiated : 05/09/2019 Topical wound management initiated : 05/09/2019 Notes: Electronic Signature(s) Signed: 05/09/2019 6:06:48 PM By: Deon Pilling Entered By: Deon Pilling on 05/09/2019 10:38:22 -------------------------------------------------------------------------------- Pain Assessment Details Patient Name: Date of Service: Cory Palmer, Cory Palmer 05/09/2019 10:30 AM Medical Record [id number] Patient Account Number: [id number] Date of Birth/Sex: Treating RN: 11/18/1960 (57 y.o. Jerilynn Mages) Carlene Coria Primary Care Tarez Bowns: PATIENT, NO Other Clinician: Sandre Kitty Referring Laquonda Welby: Treating Colbey Wirtanen/Extender:Robson, [name] in Treatment: 0 Active Problems Location of Pain Severity and Description of Pain Patient Has Paino Yes Site Locations Pain Location: Pain in Ulcers With Dressing Change: Yes Rate the pain. Current Pain Level: 7 Worst Pain Level: 10 Least Pain Level: 5 Tolerable Pain Level: 5 Character of Pain Describe the Pain: Shooting Pain Management and Medication Current Pain Management: Medication: Yes Cold Application: No Rest: Yes Massage: No Activity: No T.E.N.S.: No Heat Application: No Leg drop or elevation: No Is the Current Pain Management Adequate: Inadequate How does your wound impact your activities of daily livingo Sleep: Yes Bathing: No Appetite: Yes Relationship With Others: No Bladder Continence:  No Emotions: No Bowel Continence: No Work: No Toileting: No Drive: No Dressing: No Hobbies: No Electronic Signature(s) Signed: 07/09/2019 2:59:38 PM By: Carlene Coria RN Entered By: Carlene Coria on 05/09/2019 10:19:31 -------------------------------------------------------------------------------- Patient/Caregiver Education Details Patient Name: Cory Palmer 10/8/2020andnbsp10:30 Date  of Service: AM Medical Record [id number] Number: Patient Account Number: [id number] Treating RN: Date of Birth/Gender: Aug 12, 1960 (58 y.o. [name]) Other Clinician: Sandre Kitty Primary Care Physician: PATIENT, NO Treating [name] Referring Physician: Physician/Extender: Suella Grove in Treatment: 0 Education Assessment Education Provided To: Patient Education Topics Provided Elevated Blood Sugar/ Impact on Healing: Handouts: Elevated Blood Sugars: How Do They Affect Wound Healing Methods: Explain/Verbal, Printed Responses: Reinforcements needed Welcome To [facility]: Handouts: Welcome To [facility] Methods: Explain/Verbal, Printed Responses: Reinforcements needed Wound/Skin Impairment: Handouts: Caring for Your Ulcer, Skin Care Do's and Dont's Methods: Explain/Verbal, Printed Responses: Reinforcements needed Electronic Signature(s) Signed: 05/09/2019 6:06:48 PM By: Deon Pilling Entered By: Deon Pilling on 05/09/2019 10:38:47 -------------------------------------------------------------------------------- Wound Assessment Details Patient Name: Date of Service: Cory Palmer, Cory Palmer 05/09/2019 10:30 AM Medical Record [id number] Patient Account Number: [id number] Date of Birth/Sex: Treating RN: [birth year] (57 y.o. Jerilynn Mages) Carlene Coria Primary Care Biance Moncrief: PATIENT, NO Other Clinician: Sandre Kitty Referring Leylah Tarnow: Treating Olof Marcil/Extender:Robson, [name] in Treatment: 0 Wound Status Wound Number: 1 Primary Diabetic Wound/Ulcer of the  Lower Etiology: Extremity Wound Location: Left Foot - Plantar Wound Status: Open Wounding Event: Gradually Appeared Comorbid Hypertension, Type II Diabetes, Date Acquired: 05/04/2018 History: Osteomyelitis Weeks Of Treatment: 0 Clustered Wound: No Wound Measurements Length: (cm) 4.3 Width: (cm) 3.6 Depth: (cm) 0.2 Area: (cm) 12.158 Volume: (cm) 2.432 % Reduction in Area: 0% % Reduction in Volume: 0% Epithelialization: None Tunneling: No Undermining: Yes Starting Position (o'clock): 3 Ending Position (o'clock): 9 Maximum Distance: (cm) 0.3 Wound Description Classification: Grade 3 Foul Odor Wagner Verification: X-Ray Slough/Fib Wound Margin: Thickened Exudate Amount: Medium Exudate Type: Serosanguineous Exudate Color: red, brown Wound Bed Granulation Amount: Large (67-100%) Granulation Quality: Red Fascia Exp Necrotic Amount: None Present (0%) Fat Layer Tendon Exp Muscle Exp Joint Expo Bone Expos Electronic Signature(s) Signed: 05/09/2019 6:06:48 PM By: Deon Pilling Signed: 07/09/2019 2:59:38 PM By: Carlene Coria RN Entered By: Deon Pilling on 10/08/2 After Cleansing: No rino No Exposed Structure osed: No (Subcutaneous Tissue) Exposed: Yes osed: No osed: No sed: No ed: No 020 10:55:20 -------------------------------------------------------------------------------- Vitals Details Patient Name: Date of Service: Cory Palmer, Cory Palmer 05/09/2019 10:30 AM Medical Record [id number] Patient Account Number: [id number] Date of Birth/Sex: Treating RN: 04-Sep-1960 (57 y.o. Jerilynn Mages) Carlene Coria Primary Care Ranie Chinchilla: PATIENT, NO Other Clinician: Sandre Kitty Referring Danee Soller: Treating Raeonna Milo/Extender:Robson, [name] in Treatment: 0 Vital Signs Time Taken: 09:51 Temperature (F): 97.6 Height (in): 76 Pulse (bpm): 70 Source: Stated Respiratory Rate (breaths/min): 18 Weight (lbs): 240 Blood Pressure (mmHg): 127/70 Source: Stated Reference Range: 80 -  120 mg / dl Body Mass Index (BMI): 29.2 Electronic Signature(s) Signed: 07/09/2019 2:59:38 PM By: Carlene Coria RN Entered By: Carlene Coria on 05/09/2019 09:51:54

## 2019-07-09 NOTE — Progress Notes (Addendum)
Cory Palmer, Cory Palmer ([id number]) Visit Report for 07/08/2019 Arrival Information Details Patient Name: Date of Service: Cory Palmer, Cory Palmer 07/08/2019 10:00 AM Medical Record [id number] Patient Account Number: [id number] Date of Birth/Sex: Treating RN: 1960-10-02 (58 y.o. Cory Palmer, Cory Palmer Primary Care Mariaclara Spear: PATIENT, NO Other Clinician: Referring Zachery Niswander: Treating Micco Bourbeau/Extender:Cory Palmer in Treatment: 8 Visit Information History Since Last Visit Cane Added or deleted any medications: No Patient Arrived: 10:05 Any new allergies or adverse reactions: No Arrival Time: Had a fall or experienced change in No Accompanied By: self None activities of daily living that may affect Transfer Assistance: risk of falls: Patient Requires Transmission-Based No Signs or symptoms of abuse/neglect since last No Precautions: visito Patient Has Alerts: No Hospitalized since last visit: No Implantable device outside of the clinic excluding No cellular tissue based products placed in the center since last visit: Has Dressing in Place as Prescribed: Yes Pain Present Now: No Electronic Signature(s) Signed: 07/08/2019 5:20:01 PM By: Deon Pilling Entered By: Deon Pilling on 07/08/2019 10:06:29 -------------------------------------------------------------------------------- Encounter Discharge Information Details Patient Name: Date of Service: Cory Palmer 07/08/2019 10:00 AM Medical Record [id number] Patient Account Number: [id number] Date of Birth/Sex: Treating RN: 1961-01-25 (58 y.o. Cory Palmer Primary Care Jamika Sadek: PATIENT, NO Other Clinician: Referring Liban Guedes: Treating Cory Palmer/Extender:Cory Palmer in Treatment: 8 Encounter Discharge Information Items Post Procedure Vitals Discharge Condition: Stable Temperature (F): 97.8 Ambulatory Status: Cane Pulse (bpm): 70 Discharge Destination: Home Respiratory Rate (breaths/min): 20 Transportation:  Private Auto Blood Pressure (mmHg): 114/74 Accompanied By: self Schedule Follow-up Appointment: Yes Clinical Summary of Care: Electronic Signature(s) Signed: 07/08/2019 5:20:01 PM By: Deon Pilling Entered By: Deon Pilling on 07/08/2019 11:30:46 -------------------------------------------------------------------------------- Lower Extremity Assessment Details Patient Name: Date of Service: Cory Palmer, Cory Palmer 07/08/2019 10:00 AM Medical Record [id number] Patient Account Number: [id number] Date of Birth/Sex: Treating RN: [birth year] (58 y.o. Cory Palmer Primary Care Elaysha Bevard: PATIENT, NO Other Clinician: Referring Deriyah Kunath: Treating Cory Palmer/Extender:Cory Palmer in Treatment: 8 Edema Assessment Assessed: [Left: Yes] [Right: No] Edema: [Left: Ye] [Right: s] Calf Left: Right: Point of Measurement: 43 cm From Medial Instep 40 cm cm Ankle Left: Right: Point of Measurement: 10 cm From Medial Instep 25 cm cm Vascular Assessment Pulses: Dorsalis Pedis Palpable: [Left:Yes] Electronic Signature(s) Signed: 07/08/2019 5:20:01 PM By: Deon Pilling Entered By: Deon Pilling on 07/08/2019 10:09:01 -------------------------------------------------------------------------------- Multi Wound Chart Details Patient Name: Date of Service: Cory Palmer 07/08/2019 10:00 AM Medical Record [id number] Patient Account Number: [id number] Date of Birth/Sex: Treating RN: 16-May-1961 (58 y.o. M) Primary Care Kameron Glazebrook: PATIENT, NO Other Clinician: Referring Ailea Rhatigan: Treating Cory Palmer/Extender:Cory Palmer in Treatment: 8 Vital Signs Height(in): 76 Pulse(bpm): 70 Weight(lbs): 240 Blood Pressure(mmHg): 114/74 Body Mass Index(BMI): 29 Temperature(F): 97.8 Respiratory 20 Rate(breaths/min): Photos: [1:No Photos] [N/A:N/A] Wound Location: [1:Left Foot - Plantar] [N/A:N/A] Wounding Event: [1:Gradually Appeared] [N/A:N/A] Primary Etiology: [1:Diabetic Wound/Ulcer of  the N/A Lower Extremity] Comorbid History: [1:Hypertension, Type II Diabetes, Osteomyelitis] [N/A:N/A] Date Acquired: [1:05/04/2018] [N/A:N/A] Weeks of Treatment: [1:8] [N/A:N/A] Wound Status: [1:Open] [N/A:N/A] Measurements L x W x D 3.3x1.9x0.3 [N/A:N/A] (cm) Area (cm) : [1:4.924] [N/A:N/A] Volume (cm) : [1:1.477] [N/A:N/A] % Reduction in Area: [1:59.50%] [N/A:N/A] % Reduction in Volume: 39.30% [N/A:N/A] Classification: [1:Grade 3] [N/A:N/A] Exudate Amount: [1:Medium] [N/A:N/A] Exudate Type: [1:Serosanguineous] [N/A:N/A] Exudate Color: [1:red, brown] [N/A:N/A] Wound Margin: [1:Thickened] [N/A:N/A] Granulation Amount: [1:Large (67-100%)] [N/A:N/A] Granulation Quality: [1:Pink] [N/A:N/A] Necrotic Amount: [1:None Present (0%)] [N/A:N/A] Exposed Structures: [1:Fat Layer (Subcutaneous N/A Tissue) Exposed: Yes Fascia: No Tendon: No Muscle: No Joint: No Bone: No] Epithelialization: [  1:Small (1-33%)] [N/A:N/A] Debridement: [1:Debridement - Excisional N/A] Pre-procedure [1:11:00] [N/A:N/A] Verification/Time Out Taken: Tissue Debrided: [1:Callus, Subcutaneous, Slough] [N/A:N/A] Level: [1:Skin/Subcutaneous Tissue N/A] Debridement Area (sq cm):6.27 [N/A:N/A] Instrument: [1:Blade, Curette, Forceps] [N/A:N/A] Bleeding: [1:Minimum] [N/A:N/A] Hemostasis Achieved: [1:Pressure] [N/A:N/A] Procedural Pain: [1:0] [N/A:N/A] Post Procedural Pain: [1:0] [N/A:N/A] Debridement Treatment Procedure was tolerated [N/A:N/A] Response: [1:well] Post Debridement [1:3.3x1.9x0.3] [N/A:N/A] Measurements L x W x D (cm) Post Debridement [1:1.477] [N/A:N/A] Volume: (cm) Assessment Notes: [1:callus to periwound. Debridement] [N/A:N/A N/A] Treatment Notes Wound #1 (Left, Plantar Foot) 1. Cleanse With Wound Cleanser 3. Primary Dressing Applied Calcium Alginate 4. Secondary Dressing ABD Pad Dry Gauze Roll Gauze 5. Secured With Tape 7. Footwear/Offloading device applied Felt/Foam Electronic  Signature(s) Signed: 07/09/2019 10:01:17 AM By: [name] MD Entered By: [name] on 07/08/2019 11:34:18 -------------------------------------------------------------------------------- Multi-Disciplinary Care Plan Details Patient Name: Date of Service: MORT, SMELSER 07/08/2019 10:00 AM Medical Record [id number] Patient Account Number: [id number] Date of Birth/Sex: Treating RN: October 08, 1960 (58 y.o. [name] Primary Care Ronasia Isola: PATIENT, NO Other Clinician: Referring Trea Latner: Treating Kaycen Whitworth/Extender:Cory Palmer in Treatment: 8 Active Inactive Nutrition Nursing Diagnoses: Impaired glucose control: actual or potential Potential for alteratiion in Nutrition/Potential for imbalanced nutrition Goals: Patient/caregiver agrees to and verbalizes understanding of need to obtain nutritional consultation Date Inactivated: 07/08/2019 Target Resolution Date Initiated: 05/09/2019 Date Initiated: 05/09/2019 Date Inactivated: 07/08/2019 Date: 06/28/2019 Goal Status: Met Patient/caregiver will maintain therapeutic glucose control Date Initiated: 05/09/2019 Target Resolution Date: 08/09/2019 Goal Status: Active Interventions: Assess HgA1c results as ordered upon admission and as needed Provide education on elevated blood sugars and impact on wound healing Provide education on nutrition Treatment Activities: Education provided on Nutrition : 06/20/2019 Patient referred to Primary Care Physician for further nutritional evaluation : 05/09/2019 Notes: Wound/Skin Impairment Nursing Diagnoses: Knowledge deficit related to ulceration/compromised skin integrity Goals: Patient/caregiver will verbalize understanding of skin care regimen Date Initiated: 05/09/2019 Target Resolution Date: 08/09/2019 Goal Status: Active Interventions: Assess patient/caregiver ability to perform ulcer/skin care regimen upon admission and as needed Assess ulceration(s) every  visit Provide education on ulcer and skin care Treatment Activities: Skin care regimen initiated : 05/09/2019 Topical wound management initiated : 05/09/2019 Notes: Electronic Signature(s) Signed: 07/08/2019 5:51:44 PM By: Levan Hurst RN, BSN Entered By: Levan Hurst on 07/08/2019 11:42:55 -------------------------------------------------------------------------------- Pain Assessment Details Patient Name: Date of Service: JOHNGABRIEL, VERDE 07/08/2019 10:00 AM Medical Record [id number] Patient Account Number: [id number] Date of Birth/Sex: Treating RN: 1960-11-05 (58 y.o. Cory Palmer Primary Care Aneesah Hernan: PATIENT, NO Other Clinician: Referring Meric Joye: Treating Houston Zapien/Extender:Cory Palmer in Treatment: 8 Active Problems Location of Pain Severity and Description of Pain Patient Has Paino No Site Locations Rate the pain. Current Pain Level: 0 Pain Management and Medication Current Pain Management: Medication: No Cold Application: No Rest: No Massage: No Activity: No T.E.N.S.: No Heat Application: No Leg drop or elevation: No Is the Current Pain Management Adequate: Adequate How does your wound impact your activities of daily livingo Sleep: No Bathing: No Appetite: No Relationship With Others: No Bladder Continence: No Emotions: No Bowel Continence: No Work: No Toileting: No Drive: No Dressing: No Hobbies: No Electronic Signature(s) Signed: 07/08/2019 5:20:01 PM By: Deon Pilling Entered By: Deon Pilling on 07/08/2019 10:06:41 -------------------------------------------------------------------------------- Patient/Caregiver Education Details Patient Name: Cory Palmer 12/7/2020andnbsp10:00 Date of Service: AM Medical Record [id number] Number: Patient Account Number: [id number] Treating RN: Date of Birth/Gender: Dec 27, 1960 (58 y.o. [name]) Other Clinician: Primary Care Physician: PATIENT, NO Treating [name] Referring Physician: Physician/Extender: Suella Grove in  Treatment: 8 Education Assessment Education Provided To: Patient Education Topics Provided Elevated Blood Sugar/ Impact on Healing: Methods: Explain/Verbal Responses: State content correctly Nutrition: Methods: Explain/Verbal Responses: State content correctly Wound/Skin Impairment: Methods: Explain/Verbal Responses: State content correctly Electronic Signature(s) Signed: 07/08/2019 5:51:44 PM By: Levan Hurst RN, BSN Entered By: Levan Hurst on 07/08/2019 11:43:12 -------------------------------------------------------------------------------- Wound Assessment Details Patient Name: Date of Service: TATEM, FESLER 07/08/2019 10:00 AM Medical Record [id number] Patient Account Number: [id number] Date of Birth/Sex: Treating RN: [birth year] (58 y.o. Cory Palmer Primary Care Allycia Pitz: PATIENT, NO Other Clinician: Referring Addysyn Fern: Treating Hanadi Stanly/Extender:Cory Palmer in Treatment: 8 Wound Status Wound Number: 1 Primary Diabetic Wound/Ulcer of the Lower Etiology: Extremity Wound Location: Left Foot - Plantar Wound Status: Open Wounding Event: Gradually Appeared Comorbid Hypertension, Type II Diabetes, Date Acquired: 05/04/2018 History: Osteomyelitis Weeks Of Treatment: 8 Clustered Wound: No Photos Wound Measurements Length: (cm) 3.3 Width: (cm) 1.9 Depth: (cm) 0.3 Area: (cm) 4.924 Volume: (cm) 1.477 Wound Description Classification: Grade 3 Wound Margin: Thickened Exudate Amount: Medium Exudate Type: Serosanguineous Exudate Color: red, brown Wound Bed Granulation Amount: Large (67-100%) Granulation Quality: Pink Necrotic Amount: None Present (0%) r After Cleansing: No ibrino No Exposed Structure posed: No (Subcutaneous Tissue) Exposed: Yes posed: No posed: No osed: No sed: No % Reduction in Area: 59.5% % Reduction in Volume: 39.3% Epithelialization: Small  (1-33%) Tunneling: No Undermining: No Foul Odo Slough/F Fascia Ex Fat Layer Tendon Ex Muscle Ex Joint Exp Bone Expo Assessment Notes callus to periwound. Treatment Notes Wound #1 (Left, Plantar Foot) 1. Cleanse With Wound Cleanser 3. Primary Dressing Applied Calcium Alginate 4. Secondary Dressing ABD Pad Dry Gauze Roll Gauze 5. Secured With Tape 7. Footwear/Offloading device applied Felt/Foam Electronic Signature(s) Signed: 07/09/2019 4:34:42 PM By: Mikeal Hawthorne EMT/HBOT Signed: 07/09/2019 5:32:49 PM By: Deon Pilling Previous Signature: 07/08/2019 5:20:01 PM Version By: Deon Pilling Entered By: Mikeal Hawthorne on 07/09/2019 14:44:33 -------------------------------------------------------------------------------- Vitals Details Patient Name: Date of Service: RENNIE, ROUCH 07/08/2019 10:00 AM Medical Record [id number] Patient Account Number: [id number] Date of Birth/Sex: Treating RN: 29-Oct-1960 (58 y.o. Cory Palmer Primary Care Shade Rivenbark: PATIENT, NO Other Clinician: Referring Etta Gassett: Treating Leia Coletti/Extender:Cory Palmer in Treatment: 8 Vital Signs Time Taken: 10:10 Temperature (F): 97.8 Height (in): 76 Pulse (bpm): 70 Weight (lbs): 240 Respiratory Rate (breaths/min): 20 Body Mass Index (BMI): 29.2 Blood Pressure (mmHg): 114/74 Reference Range: 80 - 120 mg / dl Electronic Signature(s) Signed: 07/08/2019 5:20:01 PM By: Deon Pilling Entered By: Deon Pilling on 07/08/2019 10:14:21

## 2019-07-09 NOTE — Progress Notes (Signed)
Cory Palmer, Cory Palmer ([id number]) Visit Report for 07/08/2019 Debridement Details Patient Name: Date of Service: Cory Palmer, Cory Palmer 07/08/2019 10:00 AM Medical Record [id number] Patient Account Number: [id number] Date of Birth/Sex: 02-Oct-1960 (58 y.o. M) Treating RN: Levan Hurst Primary Care Provider: PATIENT, NO Other Clinician: Referring Provider: Treating Provider/Extender:Lasalle Abee, [name] in Treatment: 8 Debridement Performed for Wound #1 Left,Plantar Foot Assessment: Performed By: Physician [name]., MD Debridement Type: Debridement Severity of Tissue Pre Fat layer exposed Debridement: Level of Consciousness (Pre- Awake and Alert procedure): Pre-procedure Verification/Time Out Taken: Yes - 11:00 Start Time: 11:00 Total Area Debrided (L x W): 3.3 (cm) x 1.9 (cm) = 6.27 (cm) Tissue and other material Viable, Non-Viable, Callus, Slough, Subcutaneous, Slough debrided: Level: Skin/Subcutaneous Tissue Debridement Description: Excisional Instrument: Blade, Curette, Forceps Bleeding: Minimum Hemostasis Achieved: Pressure End Time: 11:02 Procedural Pain: 0 Post Procedural Pain: 0 Response to Treatment: Procedure was tolerated well Level of Consciousness Awake and Alert (Post-procedure): Post Debridement Measurements of Total Wound Length: (cm) 3.3 Width: (cm) 1.9 Depth: (cm) 0.3 Volume: (cm) 1.477 Character of Wound/Ulcer Post Improved Debridement: Severity of Tissue Post Debridement: Fat layer exposed Post Procedure Diagnosis Same as Pre-procedure Electronic Signature(s) Signed: 07/08/2019 5:51:44 PM By: Levan Hurst RN, BSN Signed: 07/09/2019 10:01:17 AM By: [name] MD Entered By: Levan Hurst on 07/08/2019 11:02:35 -------------------------------------------------------------------------------- HPI Details Patient Name: Date of Service: Cory Palmer, Cory Palmer 07/08/2019 10:00 AM Medical Record [id number] Patient Account Number:  [id number] Date of Birth/Sex: Treating RN: [birth year] (58 y.o. M) Primary Care Provider: PATIENT, NO Other Clinician: Referring Provider: Treating Provider/Extender:Mykaela Arena, [name] in Treatment: 8 History of Present Illness HPI Description: ADMISSION 05/09/2019 This is a 58 year old man with type 2 diabetes. He has had a previous right BKA done in [place] 4 years ago. Looking back through [facility] this year he had a left fourth toe wound in January 2020. By looking at the pictures he had also had previous amputation of the first second and third toes as well as a right BKA. 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 Sharol Given on 09/28/2018. I believe at some point he dehisced the amputation as he had a wound VAC in early March. He was admitted to hospital from 10/04/2018 through 10/18/2018 with MSSA/strep sepsis. A TEE was negative. I do not believe during this admission he was felt to have osteomyelitis however an x-ray on 10/28/2018 showed concern for osteomyelitis at the remaining second third and fourth metatarsals. An x-ray on 12/19/2018 showed progressive new bone formation at the transmetatarsal amputation sites of the second through fifth digits consistent with either acute or chronic osteomyelitis. It is hard to tell although after the discharge in March it looks as though he had a course of vancomycin and may be oral antibiotics although I am not sure about this or the duration of antibiotic therapy. The patient states that the wound opened up several months ago. It was clear that there was a wound on the plantar left foot on 12/27/2018. He was in the ER on 9/20 with a large wound on the left plantar foot given Silvadene. The patient states he has been putting bizarre combinations of things on this wound including castor oil. Past medical history; congestive heart failure, type 2 diabetes, bipolar disorder, right BKA, remote history of substance  abuse, atrial fibrillation Arterial studies done on the left and 05/06/2018 showed an ABI of 1.21 with triphasic waveforms. 10/15; I did talk with Dr. Darron Doom and asked her to order  a CRP and sedimentation rate. Based on the lab work that she is doing inflammatory markers white count etc. we will order an MRI of the left foot [see discussion above]. She actually stated that she has not really been following him regularly so was not really aware of what had been done in March. His wound actually looks somewhat better today and slightly smaller in terms of surface area 10/22; surprisingly the patient's labs that I ordered with Dr. Dennie Fetters assistance were not that bad. White count was 4.9 differential count normal. Sedimentation rate was 23 comprehensive metabolic panel was normal. Hemoglobin A1c 5.9 I thought I saw the C-reactive protein but I cannot put my finger on this right at the moment. His wound actually looks somewhat better measures better. He did however have some fluorescent green drainage 11/5; the patient's wound is come in a centimeter in width. We have been using silver alginate he has been changing the dressing himself.. We do not have a way to aggressively offload this as he will has a right leg prosthesis. Nursing still reports green drainage. I gave him antibiotics for this 11/19; the patient's wound came in again this time however he is complaining of pain in the inferior part of the wound on the left plantar foot. This is where he is undermining area is I have obtained a culture for CandS and I am going to give him empiric antibiotics. He has a complicated wound history including a right BKA and a left transmetatarsal amputation. The wound is roughly on the third met head remanent 12/7; patient has not been here in 3 weeks. I think this is a transportation issue. I gave him doxycycline last time is here. The culture showed strep and he was changed to Augmentin in my absence. He  he is completing this. X-ray however raised the suspicion of osteomyelitis and he has an MRI booked for Wednesday i.e. in 2 days. We will use calcium alginate until then Electronic Signature(s) Signed: 07/09/2019 10:01:17 AM By: [name] MD Entered By: [name] on 07/08/2019 11:38:29 -------------------------------------------------------------------------------- Physical Exam Details Patient Name: Date of Service: Cory Palmer, Cory Palmer 07/08/2019 10:00 AM Medical Record [id number] Patient Account Number: [id number] Date of Birth/Sex: Treating RN: 12-18-1960 (58 y.o. M) Primary Care Provider: PATIENT, NO Other Clinician: Referring Provider: Treating Provider/Extender:Gor Vestal, [name] in Treatment: 8 Constitutional Sitting or standing Blood Pressure is within target range for patient.. Pulse regular and within target range for patient.Marland Kitchen Respirations regular, non-labored and within target range.. Temperature is normal and within the target range for the patient.Marland Kitchen Appears in no distress. Notes Wound exam; wound measures about the same. He had denuded epithelium at the superior aspect of this towards his toes. This was removed with pickups and a #10 scalpel. The edges of the wound which were raised and hyperkeratotic were also debrided. The wound itself looked satisfactory however. Electronic Signature(s) Signed: 07/09/2019 10:01:17 AM By: [name] MD Entered By: [name] on 07/08/2019 11:39:44 -------------------------------------------------------------------------------- Physician Orders Details Patient Name: Date of Service: Cory Palmer, Cory Palmer 07/08/2019 10:00 AM Medical Record [id number] Patient Account Number: [id number] Date of Birth/Sex: Treating RN: February 27, 1961 (58 y.o. [name] Primary Care Provider: PATIENT, NO Other Clinician: Referring Provider: Treating Provider/Extender:Shameek Nyquist, [name] in Treatment: 8 Verbal / Phone  Orders: No Diagnosis Coding ICD-10 Coding Code Description E11.621 Type 2 diabetes mellitus with foot ulcer E11.42 Type 2 diabetes mellitus with diabetic polyneuropathy Z89.511 Acquired absence of right leg below knee M86.672 Other chronic osteomyelitis, left  ankle and foot Follow-up Appointments Return Appointment in 1 week. Dressing Change Frequency Wound #1 Left,Plantar Foot Change Dressing every other day. Skin Barriers/Peri-Wound Care Moisturizing lotion - to left leg daily. Wound Cleansing Wound #1 Left,Plantar Foot May shower and wash wound with soap and water. - with dressing changes. Primary Wound Dressing Wound #1 Left,Plantar Foot Calcium Alginate - may switch back to alginate with silver after MRI on Wednesday Secondary Dressing Wound #1 Left,Plantar Foot Kerlix/Rolled Gauze ABD pad Other: - felt callous pad Edema Control Avoid standing for long periods of time Elevate legs to the level of the heart or above for 30 minutes daily and/or when sitting, a frequency of: - throughout the day. Additional Orders / Instructions Stop/Decrease Smoking Follow Nutritious Diet - increase protein and vegetables in diabetic diet. Electronic Signature(s) Signed: 07/08/2019 5:51:44 PM By: Levan Hurst RN, BSN Signed: 07/09/2019 10:01:17 AM By: [name] MD Entered By: Levan Hurst on 07/08/2019 11:04:19 -------------------------------------------------------------------------------- Problem List Details Patient Name: Date of Service: Cory Palmer, Cory Palmer 07/08/2019 10:00 AM Medical Record [id number] Patient Account Number: [id number] Date of Birth/Sex: Treating RN: 1961/05/20 (58 y.o. [name] Primary Care Provider: PATIENT, NO Other Clinician: Referring Provider: Treating Provider/Extender:Dallon Dacosta, [name] in Treatment: 8 Active Problems ICD-10 Evaluated Encounter Code Description Active Date Today Diagnosis E11.621 Type 2 diabetes mellitus with  foot ulcer 05/09/2019 No Yes E11.42 Type 2 diabetes mellitus with diabetic polyneuropathy 05/09/2019 No Yes Z89.511 Acquired absence of right leg below knee 05/09/2019 No Yes M86.672 Other chronic osteomyelitis, left ankle and foot 05/09/2019 No Yes Inactive Problems Resolved Problems Electronic Signature(s) Signed: 07/09/2019 10:01:17 AM By: [name] MD Entered By: [name] on 07/08/2019 11:34:08 -------------------------------------------------------------------------------- Progress Note Details Patient Name: Date of Service: Cory Palmer 07/08/2019 10:00 AM Medical Record [id number] Patient Account Number: [id number] Date of Birth/Sex: Treating RN: 02/19/1961 (58 y.o. M) Primary Care Provider: PATIENT, NO Other Clinician: Referring Provider: Treating Provider/Extender:Shaylah Mcghie, [name] in Treatment: 8 Subjective History of Present Illness (HPI) ADMISSION 05/09/2019 This is a 58 year old man with type 2 diabetes. He has had a previous right BKA done in [place] 4 years ago. Looking back through [facility] this year he had a left fourth toe wound in January 2020. By looking at the pictures he had also had previous amputation of the first second and third toes as well as a right BKA. 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 Sharol Given on 09/28/2018. I believe at some point he dehisced the amputation as he had a wound VAC in early March. He was admitted to hospital from 10/04/2018 through 10/18/2018 with MSSA/strep sepsis. A TEE was negative. I do not believe during this admission he was felt to have osteomyelitis however an x-ray on 10/28/2018 showed concern for osteomyelitis at the remaining second third and fourth metatarsals. An x-ray on 12/19/2018 showed progressive new bone formation at the transmetatarsal amputation sites of the second through fifth digits consistent with either acute or chronic osteomyelitis. It is hard to tell  although after the discharge in March it looks as though he had a course of vancomycin and may be oral antibiotics although I am not sure about this or the duration of antibiotic therapy. The patient states that the wound opened up several months ago. It was clear that there was a wound on the plantar left foot on 12/27/2018. He was in the ER on 9/20 with a large wound on the left plantar foot given Silvadene. The patient states  he has been putting bizarre combinations of things on this wound including castor oil. Past medical history; congestive heart failure, type 2 diabetes, bipolar disorder, right BKA, remote history of substance abuse, atrial fibrillation Arterial studies done on the left and 05/06/2018 showed an ABI of 1.21 with triphasic waveforms. 10/15; I did talk with Dr. Darron Doom and asked her to order a CRP and sedimentation rate. Based on the lab work that she is doing inflammatory markers white count etc. we will order an MRI of the left foot [see discussion above]. She actually stated that she has not really been following him regularly so was not really aware of what had been done in March. His wound actually looks somewhat better today and slightly smaller in terms of surface area 10/22; surprisingly the patient's labs that I ordered with Dr. Dennie Fetters assistance were not that bad. White count was 4.9 differential count normal. Sedimentation rate was 23 comprehensive metabolic panel was normal. Hemoglobin A1c 5.9 I thought I saw the C-reactive protein but I cannot put my finger on this right at the moment. His wound actually looks somewhat better measures better. He did however have some fluorescent green drainage 11/5; the patient's wound is come in a centimeter in width. We have been using silver alginate he has been changing the dressing himself.. We do not have a way to aggressively offload this as he will has a right leg prosthesis. Nursing still reports green drainage. I gave  him antibiotics for this 11/19; the patient's wound came in again this time however he is complaining of pain in the inferior part of the wound on the left plantar foot. This is where he is undermining area is I have obtained a culture for CandS and I am going to give him empiric antibiotics. He has a complicated wound history including a right BKA and a left transmetatarsal amputation. The wound is roughly on the third met head remanent 12/7; patient has not been here in 3 weeks. I think this is a transportation issue. I gave him doxycycline last time is here. The culture showed strep and he was changed to Augmentin in my absence. He he is completing this. X-ray however raised the suspicion of osteomyelitis and he has an MRI booked for Wednesday i.e. in 2 days. We will use calcium alginate until then Objective Constitutional Sitting or standing Blood Pressure is within target range for patient.. Pulse regular and within target range for patient.Marland Kitchen Respirations regular, non-labored and within target range.. Temperature is normal and within the target range for the patient.Marland Kitchen Appears in no distress. Vitals Time Taken: 10:10 AM, Height: 76 in, Weight: 240 lbs, BMI: 29.2, Temperature: 97.8 F, Pulse: 70 bpm, Respiratory Rate: 20 breaths/min, Blood Pressure: 114/74 mmHg. General Notes: Wound exam; wound measures about the same. He had denuded epithelium at the superior aspect of this towards his toes. This was removed with pickups and a #10 scalpel. The edges of the wound which were raised and hyperkeratotic were also debrided. The wound itself looked satisfactory however. Integumentary (Hair, Skin) Wound #1 status is Open. Original cause of wound was Gradually Appeared. The wound is located on the [place]. The wound measures 3.3cm length x 1.9cm width x 0.3cm depth; 4.924cm^2 area and 1.477cm^3 volume. There is Fat Layer (Subcutaneous Tissue) Exposed exposed. There is no tunneling or  undermining noted. There is a medium amount of serosanguineous drainage noted. The wound margin is thickened. There is large (67- 100%) pink granulation within the wound bed.  There is no necrotic tissue within the wound bed. General Notes: callus to periwound. 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 There was a Excisional Skin/Subcutaneous Tissue Debridement with a total area of 6.27 sq cm performed by [name]., MD. With the following instrument(s): Blade, Curette, and Forceps to remove Viable and Non-Viable tissue/material. Material removed includes Callus, Subcutaneous Tissue, and Slough. No specimens were taken. A time out was conducted at 11:00, prior to the start of the procedure. A Minimum amount of bleeding was controlled with Pressure. The procedure was tolerated well with a pain level of 0 throughout and a pain level of 0 following the procedure. Post Debridement Measurements: 3.3cm length x 1.9cm width x 0.3cm depth; 1.477cm^3 volume. Character of Wound/Ulcer Post Debridement is improved. Severity of Tissue Post Debridement is: Fat layer exposed. Post procedure Diagnosis Wound #1: Same as Pre-Procedure Plan Follow-up Appointments: Return Appointment in 1 week. Dressing Change Frequency: Wound #1 Left,Plantar Foot: Change Dressing every other day. Skin Barriers/Peri-Wound Care: Moisturizing lotion - to left leg daily. Wound Cleansing: Wound #1 Left,Plantar Foot: May shower and wash wound with soap and water. - with dressing changes. Primary Wound Dressing: Wound #1 Left,Plantar Foot: Calcium  Alginate - may switch back to alginate with silver after MRI on Wednesday Secondary Dressing: Wound #1 Left,Plantar Foot: Kerlix/Rolled Gauze ABD pad Other: - felt callous pad Edema Control: Avoid standing for long periods of time Elevate legs to the level of the heart or above for 30 minutes daily and/or when sitting, a frequency of: - throughout the day. Additional Orders / Instructions: Stop/Decrease Smoking Follow Nutritious Diet - increase protein and vegetables in diabetic diet. 1. Continuing with alginate as the primary dressing until we see results of the MRI which will be in 2 days. 2. He is completing his Augmentin 3. He says he is offloading this is much as possible with the prosthesis on the other leg. This is never an easy combination when it comes to offloading Electronic Signature(s) Signed: 07/09/2019 10:01:17 AM By: [name] MD Entered By: [name] on 07/08/2019 11:40:39 -------------------------------------------------------------------------------- SuperBill Details Patient Name: Date of Service: Cory Palmer, Cory Palmer 07/08/2019 Medical Record [id number] Patient Account Number: [id number] Date of Birth/Sex: Treating RN: 01-19-1961 (58 y.o. 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

## 2019-07-09 NOTE — Progress Notes (Signed)
Cory Palmer, Cory Palmer ([id number]) Visit Report for 05/09/2019 Chief Complaint Document Details Patient Name: Date of Service: Cory Palmer, Cory Palmer 05/09/2019 10:30 AM Medical Record [id number] Patient Account Number: [id number] Date of Birth/Sex: Treating RN: January 03, 1961 (58 y.o. [name] Primary Care Provider: PATIENT, NO Other Clinician: [name] Referring Provider: Treating Provider/Extender:[name], [name] in Treatment: 0 Information Obtained from: Patient Chief Complaint 05/09/2019; patient here for review open wound on his left plantar foot in the setting of a previous left transmetatarsal amputation Electronic Signature(s) Signed: 05/09/2019 5:49:31 PM By: Baltazar Najjar MD Entered By: Baltazar Najjar on 05/09/2019 13:09:24 -------------------------------------------------------------------------------- Debridement Details Patient Name: Date of Service: Cory Palmer 05/09/2019 10:30 AM Medical Record [id number] Patient Account Number: [id number] Date of Birth/Sex: [birth year] (57 y.o. M) Treating RN: Shawn Stall Primary Care Provider: PATIENT, NO Other Clinician: [name] Referring Provider: Treating Provider/Extender:Onda Kattner, [name] in Treatment: 0 Debridement Performed for Wound #1 Left,Plantar Foot Assessment: Performed By: Physician [name], MD Debridement Type: Debridement Severity of Tissue Pre Fat layer exposed Debridement: Level of Consciousness (Pre- Awake and Alert procedure): Pre-procedure Verification/Time Out Taken: Yes - 10:50 Start Time: 10:51 Pain Control: Lidocaine 4% Topical Solution Total Area Debrided (L x W): 4.3 (cm) x 3.5 (cm) = 15.05 (cm) Tissue and other material Viable, Non-Viable, Slough, Subcutaneous, Skin: Dermis , Fibrin/Exudate, Slough Viable, Non-Viable, Slough, Subcutaneous, Skin: Dermis , Fibrin/Exudate, Slough debrided: Level: Skin/Subcutaneous Tissue Debridement Description:  Excisional Instrument: Curette Bleeding: Minimum Hemostasis Achieved: Pressure End Time: 10:58 Procedural Pain: 1 Post Procedural Pain: 3 Response to Treatment: Procedure was tolerated well Level of Consciousness Awake and Alert (Post-procedure): Post Debridement Measurements of Total Wound Length: (cm) 4.3 Width: (cm) 3.5 Depth: (cm) 0.2 Volume: (cm) 2.364 Character of Wound/Ulcer Post Improved Debridement: Severity of Tissue Post Debridement: Fat layer exposed Post Procedure Diagnosis Same as Pre-procedure Electronic Signature(s) Signed: 05/09/2019 5:49:31 PM By: Baltazar Najjar MD Signed: 05/09/2019 6:06:48 PM By: Shawn Stall Entered By: Baltazar Najjar on 05/09/2019 13:08:13 -------------------------------------------------------------------------------- HPI Details Patient Name: Date of Service: Cory Palmer 05/09/2019 10:30 AM Medical Record [id number] Patient Account Number: [id number] Date of Birth/Sex: Treating RN: 06/30/1961 (58 y.o. [name] Primary Care Provider: PATIENT, NO Other Clinician: [name] Referring Provider: Treating Provider/Extender:Melika Reder, [name] in Treatment: 0 History of Present Illness HPI Description: ADMISSION 05/09/2019 This is a 58 year old man with type 2 diabetes. He has had a previous right BKA done in [place] 4 years ago. Looking back through [facility] link this year he had a left fourth toe wound in January 2020. By looking at the pictures he had also had previous amputation of the first second and third toes as well as a right BKA. 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 [name] on 09/28/2018. I believe at some point he dehisced the amputation as he had a wound VAC in early March. He was admitted to hospital from 10/04/2018 through 10/18/2018 with MSSA/strep sepsis. A TEE was negative. I do not believe during this admission he was felt to have osteomyelitis however an  x-ray on 10/28/2018 showed concern for osteomyelitis at the remaining second third and fourth metatarsals. An x-ray on 12/19/2018 showed progressive new bone formation at the transmetatarsal amputation sites of the second through fifth digits consistent with either acute or chronic osteomyelitis. It is hard to tell although after the discharge in March it looks as though he had a course of vancomycin and may be oral antibiotics although I am not sure about  this or the duration of antibiotic therapy. The patient states that the wound opened up several months ago. It was clear that there was a wound on the plantar left foot on 12/27/2018. He was in the ER on 9/20 with a large wound on the left plantar foot given Silvadene. The patient states he has been putting bizarre combinations of things on this wound including castor oil. Past medical history; congestive heart failure, type 2 diabetes, bipolar disorder, right BKA, remote history of substance abuse, atrial fibrillation Arterial studies done on the left and 05/06/2018 showed an ABI of 1.21 with triphasic waveforms. Electronic Signature(s) Signed: 05/09/2019 5:49:31 PM By: [name] MD Entered By: [name] on 05/09/2019 13:14:53 -------------------------------------------------------------------------------- Physical Exam Details Patient Name: Date of Service: Cory Palmer, Cory Palmer 05/09/2019 10:30 AM Medical Record [id number] Patient Account Number: [id number] Date of Birth/Sex: Treating RN: 12/11/1960 (58 y.o. [name] Primary Care Provider: PATIENT, NO Other Clinician: Sandre Kitty Referring Provider: Treating Provider/Extender:Dontavius Keim, [name] in Treatment: 0 Constitutional Sitting or standing Blood Pressure is within target range for patient.. Pulse regular and within target range for patient.Marland Kitchen Respirations regular, non-labored and within target range.. Temperature is normal and within the target range for  the patient.Marland Kitchen Appears in no distress. Eyes Conjunctivae clear. No discharge.no icterus. Respiratory work of breathing is normal. Bilateral breath sounds are clear and equal in all lobes with no wheezes, rales or rhonchi.. Cardiovascular Heart rhythm and rate regular, without murmur or gallop.. Fetal pulses on the left easily palpable. Lymphatic Palpable in the popliteal area or the inguinal area. Integumentary (Hair, Skin) No primary cutaneous issues are seen. Neurological Diabetic insensate neuropathy to both vibration and the microfilament. Psychiatric Calm and cooperative. Notes Wound exam; fairly large wound in the middle of the left transmetatarsal amputation site on the plantar foot. Rolled senescent edges very fibrinous debris. Using a #5 curette reasonably extensive debridement hemostasis with silver nitrate. I removed subcutaneous tissue from around the circumference of the wound and tightly adherent fibrinous debris from the wound surface Electronic Signature(s) Signed: 05/09/2019 5:49:31 PM By: [name] MD Entered By: [name] on 05/09/2019 13:16:30 -------------------------------------------------------------------------------- Physician Orders Details Patient Name: Date of Service: Cory Palmer, Cory Palmer 05/09/2019 10:30 AM Medical Record [id number] Patient Account Number: [id number] Date of Birth/Sex: Treating RN: 23-Feb-1961 (58 y.o. [name] Primary Care Provider: PATIENT, NO Other Clinician: Sandre Kitty Referring Provider: Treating Provider/Extender:Demaris Bousquet, [name] in Treatment: 0 Verbal / Phone Orders: No Diagnosis Coding Follow-up Appointments Return Appointment in 1 week. Dressing Change Frequency Change Dressing every other day. Skin Barriers/Peri-Wound Care Moisturizing lotion - to left leg daily. Wound Cleansing May shower and wash wound with soap and water. - with dressing changes. Primary Wound Dressing Wound #1  Left,Plantar Foot Calcium Alginate with Silver Secondary Dressing Wound #1 Left,Plantar Foot Kerlix/Rolled Gauze ABD pad Other: - felt Edema Control Avoid standing for long periods of time Elevate legs to the level of the heart or above for 30 minutes daily and/or when sitting, a frequency of: - throughout the day. Additional Orders / Instructions Stop/Decrease Smoking Follow Nutritious Diet - increase protein and vegetables in diabetic diet. Patient Medications Allergies: tramadol Notifications Medication Indication Start End lidocaine DOSE topical 4 % gel - gel topical applied prior to debridement by MD. Electronic Signature(s) Signed: 05/09/2019 5:49:31 PM By: [name] MD Signed: 05/09/2019 6:06:48 PM By: Deon Pilling Entered By: Deon Pilling on 05/09/2019 11:04:19 -------------------------------------------------------------------------------- Prescription 05/09/2019 Patient Name: Cory Hashimoto B. Provider: [name] MD Date  of Birth: 12-26-1960 NPI#: [id number] Sex: Judie Petit DEA#: [id number] Phone #: [phone] License #: [id number] Patient Address: Eligha Bridegroom [facility] [address] [place], [place] [address] [address] [place], [place] [address] [phone] Allergies tramadol Medication Medication: Route: Strength: Form: lidocaine 4 % topical gel topical 4% gel Class: TOPICAL LOCAL ANESTHETICS Dose: Frequency / Time: Indication: gel topical applied prior to debridement by MD. Number of Refills: Number of Units: 0 Generic Substitution: Start Date: End Date: One Time Use: Substitution Permitted No Note to Pharmacy: Signature(s): Date(s): Electronic Signature(s) Signed: 05/09/2019 5:49:31 PM By: Baltazar Najjar MD Signed: 05/09/2019 6:06:48 PM By: Shawn Stall Entered By: Shawn Stall on 05/09/2019 11:04:19 --------------------------------------------------------------------------------  Problem  List Details Patient Name: Date of Service: Cory Palmer 05/09/2019 10:30 AM Medical Record [id number] Patient Account Number: [id number] Date of Birth/Sex: Treating RN: Feb 02, 1961 (58 y.o. [name] Primary Care Provider: PATIENT, NO Other Clinician: [name] Referring Provider: Treating Provider/Extender:Kjersti Dittmer, [name] in Treatment: 0 Active Problems ICD-10 Evaluated Encounter Code Description Active Date Today Diagnosis E11.621 Type 2 diabetes mellitus with foot ulcer 05/09/2019 No Yes M86.672 Other chronic osteomyelitis, left ankle and foot 05/09/2019 No Yes E11.42 Type 2 diabetes mellitus with diabetic polyneuropathy 05/09/2019 No Yes Z89.511 Acquired absence of right leg below knee 05/09/2019 No Yes Inactive Problems Resolved Problems Electronic Signature(s) Signed: 05/09/2019 5:49:31 PM By: Baltazar Najjar MD Entered By: Baltazar Najjar on 05/09/2019 11:07:04 -------------------------------------------------------------------------------- Progress Note Details Patient Name: Date of Service: Cory Palmer 05/09/2019 10:30 AM Medical Record [id number] Patient Account Number: [id number] Date of Birth/Sex: Treating RN: 05/09/1961 (58 y.o. [name] Primary Care Provider: PATIENT, NO Other Clinician: [name] Referring Provider: Treating Provider/Extender:Shown Dissinger, [name] in Treatment: 0 Subjective Chief Complaint Information obtained from Patient 05/09/2019; patient here for review open wound on his left plantar foot in the setting of a previous left transmetatarsal amputation History of Present Illness (HPI) ADMISSION 05/09/2019 This is a 58 year old man with type 2 diabetes. He has had a previous right BKA done in [place] 4 years ago. Looking back through  link this year he had a left fourth toe wound in January 2020. By looking at the pictures he had also had previous amputation of the first second and  third toes as well as a right BKA. 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 [name] on 09/28/2018. I believe at some point he dehisced the amputation as he had a wound VAC in early March. He was admitted to hospital from 10/04/2018 through 10/18/2018 with MSSA/strep sepsis. A TEE was negative. I do not believe during this admission he was felt to have osteomyelitis however an x-ray on 10/28/2018 showed concern for osteomyelitis at the remaining second third and fourth metatarsals. An x-ray on 12/19/2018 showed progressive new bone formation at the transmetatarsal amputation sites of the second through fifth digits consistent with either acute or chronic osteomyelitis. It is hard to tell although after the discharge in March it looks as though he had a course of vancomycin and may be oral antibiotics although I am not sure about this or the duration of antibiotic therapy. The patient states that the wound opened up several months ago. It was clear that there was a wound on the plantar left foot on 12/27/2018. He was in the ER on 9/20 with a large wound on the left plantar foot given Silvadene. The patient states he has been putting bizarre  combinations of things on this wound including castor oil. Past medical history; congestive heart failure, type 2 diabetes, bipolar disorder, right BKA, remote history of substance abuse, atrial fibrillation Arterial studies done on the left and 05/06/2018 showed an ABI of 1.21 with triphasic waveforms. Patient History Information obtained from Patient. Allergies tramadol Family History Diabetes - Mother,Father, No family history of Cancer, Heart Disease, Hereditary Spherocytosis, Hypertension, Kidney Disease, Lung Disease, Seizures, Stroke, Thyroid Problems, Tuberculosis. Social History Current every day smoker, Marital Status - Single, Alcohol Use - Rarely, Drug Use - No History, Caffeine Use - Daily. Medical History Eyes Denies  history of Cataracts, Glaucoma, Optic Neuritis Ear/Nose/Mouth/Throat Denies history of Chronic sinus problems/congestion Hematologic/Lymphatic Denies history of Anemia, Hemophilia, Human Immunodeficiency Virus, Lymphedema, Sickle Cell Disease Respiratory Denies history of Aspiration, Asthma, Chronic Obstructive Pulmonary Disease (COPD), Pneumothorax, Sleep Apnea, Tuberculosis Cardiovascular Patient has history of Hypertension Denies history of Angina, Arrhythmia, Congestive Heart Failure, Coronary Artery Disease, Deep Vein Thrombosis, Hypotension, Myocardial Infarction, Peripheral Arterial Disease, Peripheral Venous Disease, Phlebitis, Vasculitis Gastrointestinal Denies history of Cirrhosis , Colitis, Crohnoos, Hepatitis A, Hepatitis B, Hepatitis C Endocrine Patient has history of Type II Diabetes Denies history of Type I Diabetes Genitourinary Denies history of End Stage Renal Disease Immunological Denies history of Lupus Erythematosus, Raynaudoos, Scleroderma Integumentary (Skin) Denies history of History of Burn Musculoskeletal Patient has history of Osteomyelitis Denies history of Gout, Rheumatoid Arthritis, Osteoarthritis Neurologic Denies history of Dementia, Neuropathy, Quadriplegia, Paraplegia, Seizure Disorder Oncologic Denies history of Received Chemotherapy, Received Radiation Psychiatric Denies history of Anorexia/bulimia, Confinement Anxiety Patient is treated with Oral Agents. Blood sugar is not tested. Review of Systems (ROS) Constitutional Symptoms (General Health) Denies complaints or symptoms of Fatigue, Fever, Chills, Marked Weight Change. Eyes Denies complaints or symptoms of Dry Eyes, Vision Changes, Glasses / Contacts. Ear/Nose/Mouth/Throat Denies complaints or symptoms of Chronic sinus problems or rhinitis. Respiratory Denies complaints or symptoms of Chronic or frequent coughs, Shortness of Breath. Cardiovascular Denies complaints or symptoms of  Chest pain. Gastrointestinal Denies complaints or symptoms of Frequent diarrhea, Nausea, Vomiting. Endocrine Denies complaints or symptoms of Heat/cold intolerance. Genitourinary Denies complaints or symptoms of Frequent urination. Integumentary (Skin) Complains or has symptoms of Wounds. Musculoskeletal Denies complaints or symptoms of Muscle Pain, Muscle Weakness. Neurologic Denies complaints or symptoms of Numbness/parasthesias. Psychiatric Denies complaints or symptoms of Claustrophobia, Suicidal. Objective Constitutional Sitting or standing Blood Pressure is within target range for patient.. Pulse regular and within target range for patient.Marland Kitchen Respirations regular, non-labored and within target range.. Temperature is normal and within the target range for the patient.Marland Kitchen Appears in no distress. Vitals Time Taken: 9:51 AM, Height: 76 in, Source: Stated, Weight: 240 lbs, Source: Stated, BMI: 29.2, Temperature: 97.6 F, Pulse: 70 bpm, Respiratory Rate: 18 breaths/min, Blood Pressure: 127/70 mmHg. Eyes Conjunctivae clear. No discharge.no icterus. Respiratory work of breathing is normal. Bilateral breath sounds are clear and equal in all lobes with no wheezes, rales or rhonchi.. Cardiovascular Heart rhythm and rate regular, without murmur or gallop.. Fetal pulses on the left easily palpable. Lymphatic Palpable in the popliteal area or the inguinal area. Neurological Diabetic insensate neuropathy to both vibration and the microfilament. Psychiatric Calm and cooperative. General Notes: Wound exam; fairly large wound in the middle of the left transmetatarsal amputation site on the plantar foot. Rolled senescent edges very fibrinous debris. Using a #5 curette reasonably extensive debridement hemostasis with silver nitrate. I removed subcutaneous tissue from around the circumference of the wound and tightly adherent fibrinous debris from the  wound surface Integumentary (Hair,  Skin) No primary cutaneous issues are seen. Wound #1 status is Open. Original cause of wound was Gradually Appeared. The wound is located on the Left,Plantar Foot. The wound measures 4.3cm length x 3.6cm width x 0.2cm depth; 12.158cm^2 area and 2.432cm^3 volume. There is Fat Layer (Subcutaneous Tissue) Exposed exposed. There is no tunneling noted, however, there is undermining starting at 3:00 and ending at 9:00 with a maximum distance of 0.3cm. There is a medium amount of serosanguineous drainage noted. The wound margin is thickened. There is large (67-100%) red granulation within the wound bed. There is no necrotic tissue within the wound bed. 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 There was a Excisional Skin/Subcutaneous Tissue Debridement with a total area of 15.05 sq cm performed by [name], MD. With the following instrument(s): Curette to remove Viable and Non-Viable tissue/material. Material removed includes Subcutaneous Tissue, Slough, Skin: Dermis, and Fibrin/Exudate after achieving pain control using Lidocaine 4% Topical Solution. A time out was conducted at 10:50, prior to the start of the procedure. A Minimum amount of bleeding was controlled with Pressure. The procedure was tolerated well with a pain level of 1 throughout and a pain level of 3 following the procedure. Post Debridement Measurements: 4.3cm length x 3.5cm width x 0.2cm depth; 2.364cm^3 volume. Character of Wound/Ulcer Post Debridement is improved. Severity of Tissue Post Debridement is: Fat layer exposed. Post procedure Diagnosis  Wound #1: Same as Pre-Procedure Plan Follow-up Appointments: Return Appointment in 1 week. Dressing Change Frequency: Change Dressing every other day. Skin Barriers/Peri-Wound Care: Moisturizing lotion - to left leg daily. Wound Cleansing: May shower and wash wound with soap and water. - with dressing changes. Primary Wound Dressing: Wound #1 Left,Plantar Foot: Calcium Alginate with Silver Secondary Dressing: Wound #1 Left,Plantar Foot: Kerlix/Rolled Gauze ABD pad Other: - felt Edema Control: Avoid standing for long periods of time Elevate legs to the level of the heart or above for 30 minutes daily and/or when sitting, a frequency of: - throughout the day. Additional Orders / Instructions: Stop/Decrease Smoking Follow Nutritious Diet - increase protein and vegetables in diabetic diet. The following medication(s) was prescribed: lidocaine topical 4 % gel gel topical applied prior to debridement by MD. was prescribed at facility 1. We will use silver alginate/felt/kerlix and gauze. He will change this himself. We will order some supplies into his home. 2. At the point of this dictation I am not certain about the adequacy of the treatment for osteomyelitis that was identified on his plain x-rays on 2 separate occasions. In fact the x-ray in late May was listed as "progressive" 3. I am going to reach out to his primary physician Dr. Nadyne Coombes. See what she knows about antibiotic treatment through the end of March. I suspect he is going to need an MRI. We may be looking at additional antibiotics or perhaps additional surgery. He will need inflammatory markers CBC with differential. As far as I can tell he only received 2 weeks of antibiotics. His plain x-rays have suggested progression up until the end of May. 4. He does not appear to have any vascular issues in the left leg which is  indeed fortunate Psychologist, [facility] probation services) Signed: 05/09/2019 5:49:31 PM By: Baltazar Najjar  MD Entered By: Baltazar Najjar on 05/09/2019 13:27:27 -------------------------------------------------------------------------------- HxROS Details Patient Name: Date of Service: Cory Palmer, Cory Palmer 05/09/2019 10:30 AM Medical Record [id number] Patient Account Number: [id number] Date of Birth/Sex: Treating RN: 1961/01/28 (57 y.o. Judie Petit) Yevonne Pax Primary Care Provider: PATIENT, NO Other Clinician: [name] Referring Provider: Treating Provider/Extender:Spurgeon Gancarz, [name] in Treatment: 0 Information Obtained From Patient Constitutional Symptoms (General Health) Complaints and Symptoms: Negative for: Fatigue; Fever; Chills; Marked Weight Change Eyes Complaints and Symptoms: Negative for: Dry Eyes; Vision Changes; Glasses / Contacts Medical History: Negative for: Cataracts; Glaucoma; Optic Neuritis Ear/Nose/Mouth/Throat Complaints and Symptoms: Negative for: Chronic sinus problems or rhinitis Medical History: Negative for: Chronic sinus problems/congestion Respiratory Complaints and Symptoms: Negative for: Chronic or frequent coughs; Shortness of Breath Medical History: Negative for: Aspiration; Asthma; Chronic Obstructive Pulmonary Disease (COPD); Pneumothorax; Sleep Apnea; Tuberculosis Cardiovascular Complaints and Symptoms: Negative for: Chest pain Medical History: Positive for: Hypertension Negative for: Angina; Arrhythmia; Congestive Heart Failure; Coronary Artery Disease; Deep Vein Thrombosis; Hypotension; Myocardial Infarction; Peripheral Arterial Disease; Peripheral Venous Disease; Phlebitis; Vasculitis Gastrointestinal Complaints and Symptoms: Negative for: Frequent diarrhea; Nausea; Vomiting Medical History: Negative for: Cirrhosis ; Colitis; Crohns; Hepatitis A; Hepatitis B; Hepatitis C Endocrine Complaints and Symptoms: Negative for: Heat/cold intolerance Medical History: Positive for: Type II Diabetes Negative for: Type I Diabetes Time with  diabetes: 10 years Treated with: Oral agents Blood sugar tested every day: No Genitourinary Complaints and Symptoms: Negative for: Frequent urination Medical History: Negative for: End Stage Renal Disease Integumentary (Skin) Complaints and Symptoms: Positive for: Wounds Medical History: Negative for: History of Burn Musculoskeletal Complaints and Symptoms: Negative for: Muscle Pain; Muscle Weakness Medical History: Positive for: Osteomyelitis Negative for: Gout; Rheumatoid Arthritis; Osteoarthritis Neurologic Complaints and Symptoms: Negative for: Numbness/parasthesias Medical History: Negative for: Dementia; Neuropathy; Quadriplegia; Paraplegia; Seizure Disorder Psychiatric Complaints and Symptoms: Negative for: Claustrophobia; Suicidal Medical History: Negative for: Anorexia/bulimia; Confinement Anxiety Hematologic/Lymphatic Medical History: Negative for: Anemia; Hemophilia; Human Immunodeficiency Virus; Lymphedema; Sickle Cell Disease Immunological Medical History: Negative for: Lupus Erythematosus; Raynauds; Scleroderma Oncologic Medical History: Negative for: Received Chemotherapy; Received Radiation Immunizations Pneumococcal Vaccine: Received Pneumococcal Vaccination: No Implantable Devices None Family and Social History Cancer: No; Diabetes: Yes - Mother,Father; Heart Disease: No; Hereditary Spherocytosis: No; Hypertension: No; Kidney Disease: No; Lung Disease: No; Seizures: No; Stroke: No; Thyroid Problems: No; Tuberculosis: No; Current every day smoker; Marital Status - Single; Alcohol Use: Rarely; Drug Use: No History; Caffeine Use: Daily; Financial Concerns: Yes; Food, Clothing or Shelter Needs: Yes; Support System Lacking: No; Transportation Concerns: Manufacturing systems engineer) Signed: 05/09/2019 5:49:31 PM By: Baltazar Najjar MD Signed: 07/09/2019 2:59:38 PM By: Yevonne Pax RN Entered By: Yevonne Pax on 05/09/2019  09:58:10 -------------------------------------------------------------------------------- SuperBill Details Patient Name: Date of Service: Cory Palmer, Cory Palmer 05/09/2019 Medical Record [id number] Patient Account Number: [id number] Date of Birth/Sex: Treating RN: [birth year] (58 y.o. 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 Facility Procedures The patient participates with Medicare or their insurance follows the Medicare Facility Guidelines: CPT4 Code Description Modifier Quantity [id number] [phone] - WOUND CARE VISIT-LEV 3 EST PT 1 The patient participates with Medicare or their insurance follows the Medicare Facility  Guidelines: [id number] 11042 - DEB SUBQ TISSUE 20 SQ CM/< 1 ICD-10 Diagnosis Description E11.621 Type 2 diabetes mellitus with foot ulcer Physician Procedures CPT4 Code: [id number] Description: 99204 - WC PHYS LEVEL 4 - NEW PT ICD-10 Diagnosis Description E11.621 Type 2 diabetes mellitus with foot ulcer 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

## 2019-07-09 NOTE — Progress Notes (Signed)
[name], Palmer ([id number]) Visit Report for 05/16/2019 Arrival Information Details Patient Name: Date of Service: Cory Palmer, Cory Palmer 05/16/2019 8:15 AM Medical Record [id number] Patient Account Number: [id number] Date of Birth/Sex: Treating RN: [birth year] (58 y.o. Jerilynn Mages) Carlene Coria Primary Care Baraa Tubbs: PATIENT, NO Other Clinician: Referring Laguana Desautel: Treating Nikolus Marczak/Extender:Robson, [name] in Treatment: 1 Visit Information History Since Last Visit Cane All ordered tests and consults were completed: No Patient Arrived: 08:26 Added or deleted any medications: No Arrival Time: Any new allergies or adverse reactions: No Accompanied By: self None Had a fall or experienced change in No Transfer Assistance: activities of daily living that may affect Patient Identification Verified: Yes risk of falls: Secondary Verification Process Completed: Yes Signs or symptoms of abuse/neglect since last visito No Patient Requires Transmission-Based No Hospitalized since last visit: No Precautions: Implantable device outside of the clinic excluding No Patient Has Alerts: No cellular tissue based products placed in the center since last visit: Pain Present Now: No Electronic Signature(s) Signed: 07/09/2019 3:01:15 PM By: Carlene Coria RN Entered By: Carlene Coria on 05/16/2019 08:32:06 -------------------------------------------------------------------------------- Clinic Level of Care Assessment Details Patient Name: Date of Service: Cory, Palmer 05/16/2019 8:15 AM Medical Record [id number] Patient Account Number: [id number] Date of Birth/Sex: Treating RN: September 27, 1960 (58 y.o. [name] Primary Care Timothy Trudell: PATIENT, NO Other Clinician: Referring Sandia Pfund: Treating Careen Mauch/Extender:Robson, [name] in Treatment: 1 Clinic Level of Care Assessment Items TOOL 4 Quantity Score X - Use when only an EandM is performed on FOLLOW-UP visit 1 0 ASSESSMENTS -  Nursing Assessment / Reassessment X - Reassessment of Co-morbidities (includes updates in patient status) 1 10 X - Reassessment of Adherence to Treatment Plan 1 5 ASSESSMENTS - Wound and Skin Assessment / Reassessment X - Simple Wound Assessment / Reassessment - one wound 1 5 []  - Complex Wound Assessment / Reassessment - multiple wounds 0 []  - Dermatologic / Skin Assessment (not related to wound area) 0 ASSESSMENTS - Focused Assessment []  - Circumferential Edema Measurements - multi extremities 0 []  - Nutritional Assessment / Counseling / Intervention 0 X - Lower Extremity Assessment (monofilament, tuning fork, pulses) 1 5 []  - Peripheral Arterial Disease Assessment (using hand held doppler) 0 ASSESSMENTS - Ostomy and/or Continence Assessment and Care []  - Incontinence Assessment and Management 0 []  - Ostomy Care Assessment and Management (repouching, etc.) 0 PROCESS - Coordination of Care X - Simple Patient / Family Education for ongoing care 1 15 []  - Complex (extensive) Patient / Family Education for ongoing care 0 X - Staff obtains Programmer, systems, Records, Test Results / Process Orders 1 10 []  - Staff telephones HHA, Nursing Homes / Clarify orders / etc 0 []  - Routine Transfer to another Facility (non-emergent condition) 0 []  - Routine Hospital Admission (non-emergent condition) 0 []  - New Admissions / Biomedical engineer / Ordering NPWT, Apligraf, etc. 0 []  - Emergency Hospital Admission (emergent condition) 0 X - Simple Discharge Coordination 1 10 []  - Complex (extensive) Discharge Coordination 0 PROCESS - Special Needs []  - Pediatric / Minor Patient Management 0 []  - Isolation Patient Management 0 []  - Hearing / Language / Visual special needs 0 []  - Assessment of Community assistance (transportation, D/C planning, etc.) 0 []  - Additional assistance / Altered mentation 0 []  - Support Surface(s) Assessment (bed, cushion, seat, etc.) 0 INTERVENTIONS - Wound Cleansing /  Measurement X - Simple Wound Cleansing - one wound 1 5 []  - Complex Wound Cleansing - multiple wounds 0 X - Wound Imaging (photographs - any  number of wounds) 1 5 []  - Wound Tracing (instead of photographs) 0 X - Simple Wound Measurement - one wound 1 5 []  - Complex Wound Measurement - multiple wounds 0 INTERVENTIONS - Wound Dressings []  - Small Wound Dressing one or multiple wounds 0 X - Medium Wound Dressing one or multiple wounds 1 15 []  - Large Wound Dressing one or multiple wounds 0 X - Application of Medications - topical 1 5 []  - Application of Medications - injection 0 INTERVENTIONS - Miscellaneous []  - External ear exam 0 []  - Specimen Collection (cultures, biopsies, blood, body fluids, etc.) 0 []  - Specimen(s) / Culture(s) sent or taken to Lab for analysis 0 []  - Patient Transfer (multiple staff / Civil Service fast streamer / Similar devices) 0 []  - Simple Staple / Suture removal (25 or less) 0 []  - Complex Staple / Suture removal (26 or more) 0 []  - Hypo / Hyperglycemic Management (close monitor of Blood Glucose) 0 []  - Ankle / Brachial Index (ABI) - do not check if billed separately 0 X - Vital Signs 1 5 Has the patient been seen at the hospital within the last three years: Yes Total Score: 100 Level Of Care: New/Established - Level 3 Electronic Signature(s) Signed: 05/17/2019 6:00:55 PM By: Levan Hurst RN, BSN Entered By: Levan Hurst on 05/16/2019 10:58:00 -------------------------------------------------------------------------------- Encounter Discharge Information Details Patient Name: Date of Service: Cory Palmer 05/16/2019 8:15 AM Medical Record [id number] Patient Account Number: [id number] Date of Birth/Sex: Treating RN: 1961/03/05 (58 y.o. [name] Primary Care Floyd Wade: PATIENT, NO Other Clinician: Referring Devaeh Amadi: Treating Janya Eveland/Extender:Robson, [name] in Treatment: 1 Encounter Discharge Information Items Discharge Condition:  Stable Ambulatory Status: Cane Discharge Destination: Home Transportation: Private Auto Accompanied By: alone Schedule Follow-up Appointment: Yes Clinical Summary of Care: Patient Declined Electronic Signature(s) Signed: 05/17/2019 6:00:55 PM By: Levan Hurst RN, BSN Entered By: Levan Hurst on 05/16/2019 11:07:26 -------------------------------------------------------------------------------- Lower Extremity Assessment Details Patient Name: Date of Service: EBEN, CHOINSKI 05/16/2019 8:15 AM Medical Record [id number] Patient Account Number: [id number] Date of Birth/Sex: Treating RN: 07-Apr-1961 (58 y.o. Jerilynn Mages) Carlene Coria Primary Care Caelyn Route: PATIENT, NO Other Clinician: Referring Vern Guerette: Treating Keera Altidor/Extender:Robson, [name] in Treatment: 1 Edema Assessment Assessed: [Left: No] [Right: No] Edema: [Left: Ye] [Right: s] Calf Left: Right: Point of Measurement: 43 cm From Medial Instep 36 cm cm Ankle Left: Right: Point of Measurement: 10 cm From Medial Instep 24 cm cm Electronic Signature(s) Signed: 07/09/2019 3:01:15 PM By: Carlene Coria RN Entered By: Carlene Coria on 05/16/2019 08:41:47 -------------------------------------------------------------------------------- Multi Wound Chart Details Patient Name: Date of Service: Cory Palmer 05/16/2019 8:15 AM Medical Record [id number] Patient Account Number: [id number] Date of Birth/Sex: Treating RN: 1961-01-22 (58 y.o. 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] Comorbid History: [1:Hypertension, Type II Diabetes, Osteomyelitis] [N/A:N/A] Date Acquired: [1:05/04/2018] [N/A:N/A] Weeks of Treatment: [1:1] [N/A:N/A] Wound Status: [1:Open] [N/A:N/A] Measurements L x W x D 3.9x3.3x0.2 [N/A:N/A] (cm) Area (cm) : [1:10.108] [N/A:N/A] Volume (cm) : [1:2.022] [N/A:N/A] % Reduction in Area: [1:16.90%] [N/A:N/A] % Reduction in Volume: 16.90% [N/A:N/A] Classification: [1:Grade 3] [N/A:N/A] Exudate Amount: [1:Medium] [N/A:N/A] Exudate Type: [1:Serosanguineous] [N/A:N/A] Exudate Color: [1:red, brown] [N/A:N/A] Wound Margin: [1:Thickened] [  N/A:N/A] Granulation Amount: [1:Large (67-100%)] [N/A:N/A] Granulation Quality: [1:Red] [N/A:N/A] Necrotic Amount: [1:None Present (0%)] [N/A:N/A] Exposed Structures: [1:Fat Layer (Subcutaneous N/A Tissue) Exposed: Yes Fascia: No Tendon: No Muscle: No Joint: No Bone: No] Epithelialization: [1:None] [N/A:N/A] Electronic Signature(s) Signed: 05/16/2019 5:35:18 PM By: [name] MD Signed: 05/17/2019 6:00:55 PM By: Levan Hurst RN, BSN Entered By: [name] on 05/16/2019 09:24:20 -------------------------------------------------------------------------------- Multi-Disciplinary Care Plan Details Patient Name: Date of Service: DAK, SZUMSKI 05/16/2019 8:15 AM Medical Record [id number] Patient Account Number: [id number] Date of Birth/Sex: Treating RN: 29-Nov-1960 (58 y.o. [name] Primary Care Jandiel Magallanes: PATIENT, NO Other Clinician: Referring Sharlie Shreffler: Treating Mackenzie Groom/Extender:Robson, [name] in Treatment: 1 Active Inactive Nutrition Nursing Diagnoses: Impaired glucose control: actual or potential Potential for alteratiion in Nutrition/Potential for imbalanced nutrition Goals: Patient/caregiver agrees to and verbalizes understanding of need to obtain nutritional consultation Date Initiated: 05/09/2019 Target Resolution Date:  06/07/2019 Goal Status: Active Patient/caregiver will maintain therapeutic glucose control Date Initiated: 05/09/2019 Target Resolution Date: 06/07/2019 Goal Status: Active Interventions: Assess HgA1c results as ordered upon admission and as needed Provide education on elevated blood sugars and impact on wound healing Provide education on nutrition Treatment Activities: Patient referred to Primary Care Physician for further nutritional evaluation : 05/09/2019 Notes: Orientation to the Wound Care Program Nursing Diagnoses: Knowledge deficit related to the wound healing center program Goals: Patient/caregiver will verbalize understanding of the [place] Program Date Initiated: 05/09/2019 Target Resolution Date: 06/07/2019 Goal Status: Active Interventions: Provide education on orientation to the wound center Notes: Pain, Acute or Chronic Nursing Diagnoses: Pain, acute or chronic: actual or potential Potential alteration in comfort, pain Goals: Patient will verbalize adequate pain control and receive pain control interventions during procedures as needed Date Initiated: 05/09/2019 Target Resolution Date: 06/07/2019 Goal Status: Active Patient/caregiver will verbalize comfort level met Date Initiated: 05/09/2019 Target Resolution Date: 06/07/2019 Goal Status: Active Interventions: Complete pain assessment as per visit requirements Encourage patient to take pain medications as prescribed Provide education on pain management Reposition patient for comfort Treatment Activities: Administer pain control measures as ordered : 05/09/2019 Notes: Wound/Skin Impairment Nursing Diagnoses: Knowledge deficit related to ulceration/compromised skin integrity Goals: Patient/caregiver will verbalize understanding of skin care regimen Date Initiated: 05/09/2019 Target Resolution Date: 06/07/2019 Goal Status: Active Interventions: Assess patient/caregiver ability to perform ulcer/skin  care regimen upon admission and as needed Assess ulceration(s) every visit Provide education on ulcer and skin care Treatment Activities: Skin care regimen initiated : 05/09/2019 Topical wound management initiated : 05/09/2019 Notes: Electronic Signature(s) Signed: 05/17/2019 6:00:55 PM By: Levan Hurst RN, BSN Entered By: Levan Hurst on 05/16/2019 09:16:57 -------------------------------------------------------------------------------- Pain Assessment Details Patient Name: Date of Service: Cory Palmer 05/16/2019 8:15 AM Medical Record [id number] Patient Account Number: [id number] Date of Birth/Sex: Treating RN: 1960-08-17 (58 y.o. Jerilynn Mages) Carlene Coria Primary Care Ashtyn Freilich: PATIENT, NO Other Clinician: Referring Jonnae Fonseca: Treating Arabell Neria/Extender:Robson, [name] in Treatment: 1 Active Problems Location of Pain Severity and Description of Pain Patient Has Paino Yes Site Locations With Dressing Change: Yes Duration of the Pain. Constant / Intermittento Intermittent How Long Does it Lasto Hours: 1 Minutes: Rate the pain. Current Pain Level: 6 Worst Pain Level: 6 Least Pain Level: 0 Tolerable Pain Level: 5 Character of Pain Describe the Pain: Shooting Pain Management and Medication Current Pain Management: Medication: No Cold Application: No Rest: Yes Massage: No Activity: No T.E.N.S.: No Heat Application: No Leg drop or elevation: No Is the Current Pain Management Adequate: Inadequate How does your wound impact your activities  of daily livingo Sleep: Yes Bathing: No Appetite: Yes Relationship With Others: No Bladder Continence: No Emotions: No Bowel Continence: No Work: No Toileting: No Drive: No Dressing: No Hobbies: No Electronic Signature(s) Signed: 07/09/2019 3:01:15 PM By: Carlene Coria RN Entered By: Carlene Coria on 05/16/2019 08:35:11 -------------------------------------------------------------------------------- Patient/Caregiver  Education Details Patient Name: Cory Palmer 10/15/2020andnbsp8:15 Date of Service: AM Medical Record [id number] Number: Patient Account Number: [id number] Treating RN: Date of Birth/Gender: February 07, 1961 (58 y.o. [name]) Other Clinician: Primary Care Physician: PATIENT, NO Treating [name] Referring Physician: Physician/Extender: Suella Grove in Treatment: 1 Education Assessment Education Provided To: Patient Education Topics Provided Wound/Skin Impairment: Methods: Explain/Verbal Responses: State content correctly Electronic Signature(s) Signed: 05/17/2019 6:00:55 PM By: Levan Hurst RN, BSN Entered By: Levan Hurst on 05/16/2019 09:17:10 -------------------------------------------------------------------------------- Wound Assessment Details Patient Name: Date of Service: MADDIX, HEINZ 05/16/2019 8:15 AM Medical Record [id number] Patient Account Number: [id number] Date of Birth/Sex: Treating RN: [birth year] (58 y.o. Jerilynn Mages) Carlene Coria Primary Care Lilliahna Schubring: PATIENT, NO Other Clinician: Referring Daishia Fetterly: Treating Nakaya Mishkin/Extender:Robson, [name] in Treatment: 1 Wound Status Wound Number: 1 Primary Diabetic Wound/Ulcer of the Lower Etiology: Extremity Wound Location: Left Foot - Plantar Wound Status: Open Wounding Event: Gradually Appeared Comorbid Hypertension, Type II Diabetes, Date Acquired: 05/04/2018 History: Osteomyelitis Weeks Of Treatment: 1 Clustered Wound: No Photos Wound Measurements Length: (cm) 3.9 Width: (cm) 3.3 Depth: (cm) 0.2 Area: (cm) 10.108 Volume: (cm) 2.022 Wound Description Classification: Grade 3 Wound Margin: Thickened Exudate Amount: Medium Exudate Type: Serosanguineous Exudate Color: red, brown Wound Bed Granulation Amount: Large (67-100%) Granulation Quality: Red Necrotic Amount: None Present (0%) fter Cleansing: No ino No Exposed Structure ed: No ubcutaneous Tissue) Exposed: Yes ed: No ed:  No d: No : No % Reduction in Area: 16.9% % Reduction in Volume: 16.9% Epithelialization: None Tunneling: No Undermining: No Foul Odor A Slough/Fibr Fascia Expos Fat Layer (S Tendon Expos Muscle Expos Joint Expose Bone Exposed Electronic Signature(s) Signed: 05/21/2019 1:32:19 PM By: Mikeal Hawthorne EMT/HBOT Signed: 07/09/2019 3:01:15 PM By: Carlene Coria RN Entered By: Mikeal Hawthorne on 05/16/2019 14:42:20 -------------------------------------------------------------------------------- Vitals Details Patient Name: Date of Service: Cory Palmer 05/16/2019 8:15 AM Medical Record [id number] Patient Account Number: [id number] Date of Birth/Sex: Treating RN: Jan 11, 1961 (58 y.o. Jerilynn Mages) Dolores Lory, [place] Primary Care Cay Kath: PATIENT, NO Other Clinician: Referring Lamees Gable: Treating Shelba Susi/Extender:Robson, [name] in Treatment: 1 Vital Signs Time Taken: 08:32 Temperature (F): 97.7 Height (in): 76 Pulse (bpm): 78 Weight (lbs): 240 Respiratory Rate (breaths/min): 16 Body Mass Index (BMI): 29.2 Blood Pressure (mmHg): 117/72 Reference Range: 80 - 120 mg / dl Electronic Signature(s) Signed: 07/09/2019 3:01:15 PM By: Carlene Coria RN Entered By: Carlene Coria on 05/16/2019 08:34:02

## 2019-07-09 NOTE — Progress Notes (Signed)
Cory Palmer, Cory Palmer ([id number]) Visit Report for 05/23/2019 Arrival Information Details Patient Name: Date of Service: Cory Palmer, Cory Palmer 05/23/2019 1:15 PM Medical Record [id number] Patient Account Number: [id number] Date of Birth/Sex: Treating RN: 09/11/1960 (58 y.o. Cory Palmer, Meta.Reding Primary Care Nahum Sherrer: PATIENT, NO Other Clinician: Referring Maecy Podgurski: Treating Lener Ventresca/Extender:Robson, [name] in Treatment: 2 Visit Information History Since Last Visit Cane Added or deleted any medications: No Patient Arrived: 13:25 Any new allergies or adverse reactions: No Arrival Time: Had a fall or experienced change in No Accompanied By: self Other activities of daily living that may affect Transfer Assistance: risk of falls: Patient Identification Verified: Yes Signs or symptoms of abuse/neglect since last No Secondary Verification Process Completed: Yes visito Patient Requires Transmission-Based No Hospitalized since last visit: No Precautions: Implantable device outside of the clinic excluding No Patient Has Alerts: No cellular tissue based products placed in the center since last visit: Has Dressing in Place as Prescribed: Yes Pain Present Now: Yes Electronic Signature(s) Signed: 07/09/2019 3:02:15 PM By: Cory Palmer Entered By: Cory Palmer on 05/23/2019 13:25:29 -------------------------------------------------------------------------------- Encounter Discharge Information Details Patient Name: Date of Service: Cory Palmer 05/23/2019 1:15 PM Medical Record [id number] Patient Account Number: [id number] Date of Birth/Sex: Treating RN: 1961/03/07 (58 y.o. Cory Palmer Primary Care Nocholas Damaso: PATIENT, NO Other Clinician: Referring Deseray Daponte: Treating Morgan Keinath/Extender:Robson, [name] in Treatment: 2 Encounter Discharge Information Items Post Procedure Vitals Discharge Condition: Stable Temperature (F): 97.8 Ambulatory Status:  Ambulatory Pulse (bpm): 75 Discharge Destination: Home Respiratory Rate (breaths/min): 16 Transportation: Other Blood Pressure (mmHg): 135/70 Accompanied By: self Schedule Follow-up Appointment: Yes Clinical Summary of Care: Patient Declined Electronic Signature(s) Signed: 05/27/2019 5:21:50 PM By: Kela Millin Entered By: Kela Millin on 05/23/2019 14:19:22 -------------------------------------------------------------------------------- Lower Extremity Assessment Details Patient Name: Date of Service: Cory Palmer 05/23/2019 1:15 PM Medical Record [id number] Patient Account Number: [id number] Date of Birth/Sex: Treating RN: 01-08-1961 (58 y.o. Cory Palmer Primary Care Breunna Nordmann: PATIENT, NO Other Clinician: Referring Harshitha Fretz: Treating Brentley Landfair/Extender:Robson, [name] in Treatment: 2 Edema Assessment Assessed: [Left: Yes] [Right: No] Edema: [Left: Ye] [Right: s] Calf Left: Right: Point of Measurement: 43 cm From Medial Instep 36 cm cm Ankle Left: Right: Point of Measurement: 10 cm From Medial Instep 26 cm cm Vascular Assessment Pulses: Dorsalis Pedis Palpable: [Left:Yes] Electronic Signature(s) Signed: 05/23/2019 6:35:09 PM By: Cory Palmer Entered By: Cory Palmer on 05/23/2019 13:37:36 -------------------------------------------------------------------------------- Multi Wound Chart Details Patient Name: Date of Service: Cory Palmer 05/23/2019 1:15 PM Medical Record [id number] Patient Account Number: [id number] Date of Birth/Sex: Treating RN: [birth year] (58 y.o. Cory Palmer Primary Care Jahnay Lantier: PATIENT, NO Other Clinician: Referring Eriyonna Matsushita: Treating Melainie Krinsky/Extender:Robson, [name] in Treatment: 2 Vital Signs Height(in): 76 Pulse(bpm): 75 Weight(lbs): 240 Blood Pressure(mmHg): 135/70 Body Mass Index(BMI): 29 Temperature(F): 97.8 Respiratory 16 Rate(breaths/min): Photos: [1:No Photos]  [N/A:N/A] Wound Location: [1:Left Foot - Plantar] [N/A:N/A] Wounding Event: [1:Gradually Appeared] [N/A:N/A] Primary Etiology: [1:Diabetic Wound/Ulcer of the N/A Lower Extremity] Comorbid History: [1:Hypertension, Type II Diabetes, Osteomyelitis] [N/A:N/A] Date Acquired: [1:05/04/2018] [N/A:N/A] Weeks of Treatment: [1:2] [N/A:N/A] Wound Status: [1:Open] [N/A:N/A] Measurements L x W x D 3.4x2.8x0.2 [N/A:N/A] (cm) Area (cm) : [1:7.477] [N/A:N/A] Volume (cm) : [1:1.495] [N/A:N/A] % Reduction in Area: [1:38.50%] [N/A:N/A] % Reduction in Volume: 38.50% [N/A:N/A] Classification: [1:Grade 3] [N/A:N/A] Exudate Amount: [1:Medium] [N/A:N/A] Exudate Type: [1:Serosanguineous] [N/A:N/A] Exudate Color: [1:red, brown] [N/A:N/A] Wound Margin: [1:Thickened] [N/A:N/A] Granulation Amount: [1:Large (67-100%)] [N/A:N/A] Granulation Quality: [1:Red] [N/A:N/A] Necrotic Amount: [1:None Present (0%)] [N/A:N/A] Exposed Structures: [1:Fat Layer (Subcutaneous N/A Tissue) Exposed:  Yes Fascia: No Tendon: No Muscle: No Joint: No Bone: No] Epithelialization: [1:None] [N/A:N/A] Debridement: [1:Debridement - Excisional N/A] Pre-procedure [1:13:46] [N/A:N/A] Verification/Time Out Taken: Pain Control: [1:Lidocaine 5% topical ointment] [N/A:N/A] Tissue Debrided: [1:Callus, Subcutaneous] [N/A:N/A] Level: [1:Skin/Subcutaneous Tissue N/A] Debridement Area (sq cm):12 [N/A:N/A] Instrument: [1:Curette] [N/A:N/A] Bleeding: [1:Moderate] [N/A:N/A] Hemostasis Achieved: [1:Pressure] [N/A:N/A] Procedural Pain: [1:2] [N/A:N/A] Post Procedural Pain: [1:4] [N/A:N/A] Debridement Treatment [1:Procedure was tolerated] [N/A:N/A] Response: [1:well] Post Debridement [1:3.4x2.8x0.2] [N/A:N/A] Measurements L x W x D (cm) Post Debridement [1:1.495] [N/A:N/A] Volume: (cm) Assessment Notes: [1:callus to periwound. Debridement] [N/A:N/A N/A] Treatment Notes Electronic Signature(s) Signed: 05/23/2019 6:29:04 PM By: [name]  MD Signed: 05/23/2019 6:35:09 PM By: Cory Palmer Entered By: [name] on 05/23/2019 13:51:35 -------------------------------------------------------------------------------- Multi-Disciplinary Care Plan Details Patient Name: Date of Service: Cory Palmer, Cory Palmer 05/23/2019 1:15 PM Medical Record [id number] Patient Account Number: [id number] Date of Birth/Sex: Treating RN: Jun 02, 1961 (58 y.o. Cory Palmer Primary Care Linden Tagliaferro: PATIENT, NO Other Clinician: Referring Brittiany Wiehe: Treating Keyira Mondesir/Extender:Robson, [name] in Treatment: 2 Active Inactive Nutrition Nursing Diagnoses: Impaired glucose control: actual or potential Potential for alteratiion in Nutrition/Potential for imbalanced nutrition Goals: Patient/caregiver agrees to and verbalizes understanding of need to obtain nutritional consultation Date Initiated: 05/09/2019 Target Resolution Date: 06/07/2019 Goal Status: Active Patient/caregiver will maintain therapeutic glucose control Date Initiated: 05/09/2019 Target Resolution Date: 06/07/2019 Goal Status: Active Interventions: Assess HgA1c results as ordered upon admission and as needed Provide education on elevated blood sugars and impact on wound healing Provide education on nutrition Treatment Activities: Patient referred to Primary Care Physician for further nutritional evaluation : 05/09/2019 Notes: Pain, Acute or Chronic Nursing Diagnoses: Pain, acute or chronic: actual or potential Potential alteration in comfort, pain Goals: Patient will verbalize adequate pain control and receive pain control interventions during procedures as needed Date Initiated: 05/09/2019 Target Resolution Date: 06/07/2019 Goal Status: Active Patient/caregiver will verbalize comfort level met Date Initiated: 05/09/2019 Target Resolution Date: 06/07/2019 Goal Status: Active Interventions: Complete pain assessment as per visit requirements Encourage patient to take pain  medications as prescribed Provide education on pain management Reposition patient for comfort Treatment Activities: Administer pain control measures as ordered : 05/09/2019 Notes: Wound/Skin Impairment Nursing Diagnoses: Knowledge deficit related to ulceration/compromised skin integrity Goals: Patient/caregiver will verbalize understanding of skin care regimen Date Initiated: 05/09/2019 Target Resolution Date: 06/07/2019 Goal Status: Active Interventions: Assess patient/caregiver ability to perform ulcer/skin care regimen upon admission and as needed Assess ulceration(s) every visit Provide education on ulcer and skin care Treatment Activities: Skin care regimen initiated : 05/09/2019 Topical wound management initiated : 05/09/2019 Notes: Electronic Signature(s) Signed: 05/23/2019 6:35:09 PM By: Cory Palmer Entered By: Cory Palmer on 05/23/2019 13:38:31 -------------------------------------------------------------------------------- Pain Assessment Details Patient Name: Date of Service: Cory Palmer, Cory Palmer 05/23/2019 1:15 PM Medical Record [id number] Patient Account Number: [id number] Date of Birth/Sex: Treating RN: Jun 13, 1961 (58 y.o. Cory Palmer Primary Care Veronda Gabor: PATIENT, NO Other Clinician: Referring Mickeal Daws: Treating Arneda Sappington/Extender:Robson, [name] in Treatment: 2 Active Problems Location of Pain Severity and Description of Pain Patient Has Paino Yes Site Locations Rate the pain. Current Pain Level: 6 Pain Management and Medication Current Pain Management: Electronic Signature(s) Signed: 05/23/2019 6:35:09 PM By: Cory Palmer Signed: 07/09/2019 3:02:15 PM By: Cory Palmer Entered By: Cory Palmer on 05/23/2019 13:25:59 -------------------------------------------------------------------------------- Patient/Caregiver Education Details Patient Name: Date of Service: Cory Palmer 10/22/2020andnbsp1:15 PM Medical Record Patient  Account Number: [id number] Number: Treating RN: Cory Palmer Date of Birth/Gender: February 12, 1961 (57 y.o. Other Clinician: M) Treating [name] Primary Care  Physician: PATIENT, NO Physician/Extender: Referring Physician: Suella Grove in Treatment: 2 Education Assessment Education Provided To: Patient Education Topics Provided Elevated Blood Sugar/ Impact on Healing: Handouts: Elevated Blood Sugars: How Do They Affect Wound Healing Methods: Explain/Verbal Responses: Reinforcements needed Electronic Signature(s) Signed: 05/23/2019 6:35:09 PM By: Cory Palmer Entered By: Cory Palmer on 05/23/2019 13:38:41 -------------------------------------------------------------------------------- Wound Assessment Details Patient Name: Date of Service: Cory Palmer, Cory Palmer 05/23/2019 1:15 PM Medical Record [id number] Patient Account Number: [id number] Date of Birth/Sex: Treating RN: 12-Jun-1961 (58 y.o. Cory Palmer Primary Care Piccola Arico: PATIENT, NO Other Clinician: Referring Zehava Turski: Treating Hellena Pridgen/Extender:Robson, [name] in Treatment: 2 Wound Status Wound Number: 1 Primary Diabetic Wound/Ulcer of the Lower Etiology: Extremity Wound Location: Left Foot - Plantar Wound Status: Open Wounding Event: Gradually Appeared Comorbid Hypertension, Type II Diabetes, Date Acquired: 05/04/2018 History: Osteomyelitis Weeks Of Treatment: 2 Clustered Wound: No Photos Wound Measurements Length: (cm) 3.4 Width: (cm) 2.8 Depth: (cm) 0.2 Area: (cm) 7.477 Volume: (cm) 1.495 Wound Description Classification: Grade 3 Wound Margin: Thickened Exudate Amount: Medium Exudate Type: Serosanguineous Exudate Color: red, brown Wound Bed Granulation Amount: Large (67-100%) Granulation Quality: Red Necrotic Amount: None Present (0%) After Cleansing: No rino No Exposed Structure posed: No (Subcutaneous Tissue) Exposed: Yes posed: No posed: No osed: No sed: No %  Reduction in Area: 38.5% % Reduction in Volume: 38.5% Epithelialization: None Tunneling: No Undermining: No Foul Odor Slough/Fib Fascia Ex Fat Layer Tendon Ex Muscle Ex Joint Exp Bone Expo Assessment Notes callus to periwound. Electronic Signature(s) Signed: 05/28/2019 3:44:28 PM By: Mikeal Hawthorne EMT/HBOT Signed: 05/28/2019 5:02:12 PM By: Cory Palmer Previous Signature: 05/23/2019 6:35:09 PM Version By: Cory Palmer Entered By: Mikeal Hawthorne on 05/28/2019 09:21:58 -------------------------------------------------------------------------------- Vitals Details Patient Name: Date of Service: Cory Palmer, Cory Palmer 05/23/2019 1:15 PM Medical Record [id number] Patient Account Number: [id number] Date of Birth/Sex: Treating RN: 15-Jun-1961 (58 y.o. Cory Palmer Primary Care Beryl Balz: PATIENT, NO Other Clinician: Referring Thailan Sava: Treating Lin Glazier/Extender:Robson, [name] in Treatment: 2 Vital Signs Time Taken: 13:25 Temperature (F): 97.8 Height (in): 76 Pulse (bpm): 75 Weight (lbs): 240 Respiratory Rate (breaths/min): 16 Body Mass Index (BMI): 29.2 Blood Pressure (mmHg): 135/70 Reference Range: 80 - 120 mg / dl Electronic Signature(s) Signed: 07/09/2019 3:02:15 PM By: Cory Palmer Entered By: Cory Palmer on 05/23/2019 13:25:46

## 2019-07-09 NOTE — Progress Notes (Signed)
Cory Palmer, Cory Palmer ([id number]) Visit Report for 05/09/2019 Abuse/Suicide Risk Screen Details Patient Name: Date of Service: Cory Palmer, Cory Palmer 05/09/2019 10:30 AM Medical Record [id number] Patient Account Number: [id number] Date of Birth/Sex: Treating RN: [birth year] (57 y.o. Judie Petit) Yevonne Pax Primary Care Emery Binz: PATIENT, NO Other Clinician: [name] Referring Amaiyah Nordhoff: Treating Tanish Sinkler/Extender:Robson, [name] in Treatment: 0 Abuse/Suicide Risk Screen Items Answer ABUSE RISK SCREEN: Has anyone close to you tried to hurt or harm you recentlyo No Do you feel uncomfortable with anyone in your familyo No Has anyone forced you do things that you didnt want to doo No Electronic Signature(s) Signed: 07/09/2019 2:59:38 PM By: Yevonne Pax RN Entered By: Yevonne Pax on 05/09/2019 09:58:22 -------------------------------------------------------------------------------- Activities of Daily Living Details Patient Name: Date of Service: Cory Palmer, Cory Palmer 05/09/2019 10:30 AM Medical Record [id number] Patient Account Number: [id number] Date of Birth/Sex: Treating RN: Jan 19, 1961 (57 y.o. Judie Petit) Yevonne Pax Primary Care Tevion Laforge: PATIENT, NO Other Clinician: [name] Referring Estill Llerena: Treating Lynniah Janoski/Extender:Robson, [name] in Treatment: 0 Activities of Daily Living Items Answer Activities of Daily Living (Please select one for each item) Drive Automobile Not Able Take Medications Completely Able Use Telephone Completely Able Care for Appearance Completely Able Use Toilet Completely Able Bath / Shower Completely Able Dress Self Completely Able Feed Self Completely Able Walk Completely Able Get In / Out Bed Completely Able Housework Completely Able Prepare Meals Completely Able Handle Money Completely Able Shop for Self Completely Able Electronic Signature(s) Signed: 07/09/2019 2:59:38 PM By: Yevonne Pax RN Entered By: Yevonne Pax on 05/09/2019  09:58:50 -------------------------------------------------------------------------------- Education Screening Details Patient Name: Date of Service: Cory Palmer 05/09/2019 10:30 AM Medical Record [id number] Patient Account Number: [id number] Date of Birth/Sex: Treating RN: 01/23/1961 (57 y.o. Judie Petit) Yevonne Pax Primary Care Susy Placzek: PATIENT, NO Other Clinician: [name] Referring Julisa Flippo: Treating Man Effertz/Extender:Robson, [name] in Treatment: 0 Primary Learner Assessed: Patient Learning Preferences/Education Level/Primary Language Learning Preference: Explanation Highest Education Level: High School Preferred Language: English Cognitive Barrier Language Barrier: No Translator Needed: No Memory Deficit: No Emotional Barrier: No Cultural/Religious Beliefs Affecting Medical Care: No Physical Barrier Impaired Vision: No Impaired Hearing: No Decreased Hand dexterity: No Knowledge/Comprehension Knowledge Level: Medium Comprehension Level: Medium Ability to understand written High instructions: Ability to understand verbal High instructions: Motivation Anxiety Level: Calm Cooperation: Cooperative Education Importance: Acknowledges Need Interest in Health Problems: Uninterested Perception: Coherent Willingness to Engage in Self- Medium Management Activities: Readiness to Engage in Self- Medium Management Activities: Electronic Signature(s) Signed: 07/09/2019 2:59:38 PM By: Yevonne Pax RN Entered By: Yevonne Pax on 05/09/2019 09:59:37 -------------------------------------------------------------------------------- Fall Risk Assessment Details Patient Name: Date of Service: Cory Palmer 05/09/2019 10:30 AM Medical Record [id number] Patient Account Number: [id number] Date of Birth/Sex: Treating RN: 1960/09/25 (57 y.o. Judie Petit) Yevonne Pax Primary Care Johnmatthew Solorio: PATIENT, NO Other Clinician: [name] Referring Konya Fauble: Treating  Rosey Eide/Extender:Robson, [name] in Treatment: 0 Fall Risk Assessment Items Have you had 2 or more falls in the last 12 monthso 0 No Have you had any fall that resulted in injury in the last 12 monthso 0 No FALLS RISK SCREEN History of falling - immediate or within 3 months 0 No Secondary diagnosis (Do you have 2 or more medical diagnoseso) 0 No Ambulatory aid None/bed rest/wheelchair/nurse 0 No Crutches/cane/walker 0 No Furniture 0 No Intravenous therapy Access/Saline/Heparin Lock 0 No Weak (short steps with or without shuffle, stooped but able to lift head 0 No while walking, may seek support from furniture) Impaired (short steps with shuffle, may have difficulty arising  from chair, 0 No head down, impaired balance) Mental Status Oriented to own ability 0 No Overestimates or forgets limitations 0 No Risk Level: Low Risk Score: 0 Electronic Signature(s) Signed: 07/09/2019 2:59:38 PM By: Carlene Coria RN Entered By: Carlene Coria on 05/09/2019 10:00:00 -------------------------------------------------------------------------------- Foot Assessment Details Patient Name: Date of Service: Cory Palmer 05/09/2019 10:30 AM Medical Record [id number] Patient Account Number: [id number] Date of Birth/Sex: Treating RN: July 01, 1961 (57 y.o. 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 Jerilynn Mages) Carlene Coria Primary Care Anthonella Klausner: PATIENT, NO Other Clinician: Sandre Kitty Referring [employer]: Treating Julann Mcgilvray/Extender:Robson, [name] in Treatment: 0 Height (in): 76 Weight (lbs): 240 Body Mass Index (BMI): 29.2 Nutrition Risk Screening Items Score Screening NUTRITION RISK SCREEN: I have an illness or condition that made me change the kind and/or 0 No amount of food I eat I eat fewer than two meals per day 0 No I eat few fruits and vegetables, or milk products 0 No I have three or more drinks of beer, liquor or wine almost every day 0 No I have tooth or mouth problems that make it hard for me to eat 0 No I don't always have enough money to buy the food I need 0 No I eat alone most of the time 1 Yes I take three or more different prescribed or over-the-counter drugs a day 1 Yes 2 Yes Without wanting to, I have lost or gained 10 pounds in the last six months I am not always physically able to shop, cook and/or feed myself 2 Yes Nutrition Protocols Good Risk Protocol Moderate Risk Protocol Provide education on High Risk Proctocol 0 nutrition Risk Level: High Risk Score: 6 Electronic Signature(s) Signed: 07/09/2019 2:59:38 PM By: Carlene Coria RN Entered By: Carlene Coria on 05/09/2019 10:00:33

## 2019-07-10 ENCOUNTER — Ambulatory Visit (HOSPITAL_COMMUNITY)
Admission: RE | Admit: 2019-07-10 | Discharge: 2019-07-10 | Disposition: A | Discharge status: Home / Self Care | Payer: Medicare Other | Source: Ambulatory Visit | Attending: Internal Medicine | Admitting: Internal Medicine

## 2019-07-10 ENCOUNTER — Other Ambulatory Visit (HOSPITAL_COMMUNITY): Payer: Self-pay | Admitting: Internal Medicine

## 2019-07-10 ENCOUNTER — Other Ambulatory Visit: Payer: Self-pay

## 2019-07-10 DIAGNOSIS — E11621 Type 2 diabetes mellitus with foot ulcer: Secondary | ICD-10-CM

## 2019-07-10 DIAGNOSIS — L97509 Non-pressure chronic ulcer of other part of unspecified foot with unspecified severity: Secondary | ICD-10-CM | POA: Diagnosis present

## 2019-07-15 ENCOUNTER — Encounter (HOSPITAL_BASED_OUTPATIENT_CLINIC_OR_DEPARTMENT_OTHER): Payer: Medicare Other | Admitting: Internal Medicine

## 2019-07-15 ENCOUNTER — Other Ambulatory Visit: Payer: Self-pay

## 2019-07-15 DIAGNOSIS — E11621 Type 2 diabetes mellitus with foot ulcer: Secondary | ICD-10-CM | POA: Diagnosis not present

## 2019-07-15 NOTE — Progress Notes (Signed)
Cory Palmer ([id number]) Visit Report for 07/15/2019 HPI Details Patient Name: Date of Service: Cory Palmer, UNREIN 07/15/2019 10:15 AM Medical Record [id number] Patient Account Number: [id number] Date of Birth/Sex: 08-Feb-1961 (58 y.o. M) Treating RN: Levan Hurst Primary Care Provider: PATIENT, NO Other Clinician: Referring Provider: Treating Provider/Extender:, [name] in Treatment: 9 History of Present Illness HPI Description: ADMISSION 05/09/2019 This is a 58 year old man with type 2 diabetes. He has had a previous right BKA done in [place] 4 years ago. Looking back through [place] [facility] this year he had a left fourth toe wound in January 2020. By looking at the pictures he had also had previous amputation of the first second and third toes as well as a right BKA. 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 Sharol Given on 09/28/2018. I believe at some point he dehisced the amputation as he had a wound VAC in early March. He was admitted to hospital from 10/04/2018 through 10/18/2018 with MSSA/strep sepsis. A TEE was negative. I do not believe during this admission he was felt to have osteomyelitis however an x-ray on 10/28/2018 showed concern for osteomyelitis at the remaining second third and fourth metatarsals. An x-ray on 12/19/2018 showed progressive new bone formation at the transmetatarsal amputation sites of the second through fifth digits consistent with either acute or chronic osteomyelitis. It is hard to tell although after the discharge in March it looks as though he had a course of vancomycin and may be oral antibiotics although I am not sure about this or the duration of antibiotic therapy. The patient states that the wound opened up several months ago. It was clear that there was a wound on the plantar left foot on 12/27/2018. He was in the ER on 9/20 with a large wound on the left plantar foot given Silvadene. The patient states he  has been putting bizarre combinations of things on this wound including castor oil. Past medical history; congestive heart failure, type 2 diabetes, bipolar disorder, right BKA, remote history of substance abuse, atrial fibrillation Arterial studies done on the left and 05/06/2018 showed an ABI of 1.21 with triphasic waveforms. 10/15; I did talk with Dr. Darron Doom and asked her to order a CRP and sedimentation rate. Based on the lab work that she is doing inflammatory markers white count etc. we will order an MRI of the left foot [see discussion above]. She actually stated that she has not really been following him regularly so was not really aware of what had been done in March. His wound actually looks somewhat better today and slightly smaller in terms of surface area 10/22; surprisingly the patient's labs that I ordered with Dr. Dennie Fetters assistance were not that bad. White count was 4.9 differential count normal. Sedimentation rate was 23 comprehensive metabolic panel was normal. Hemoglobin A1c 5.9 I thought I saw the C-reactive protein but I cannot put my finger on this right at the moment. His wound actually looks somewhat better measures better. He did however have some fluorescent green drainage 11/5; the patient's wound is come in a centimeter in width. We have been using silver alginate he has been changing the dressing himself.. We do not have a way to aggressively offload this as he will has a right leg prosthesis. Nursing still reports green drainage. I gave him antibiotics for this 11/19; the patient's wound came in again this time however he is complaining of pain in the inferior part of the wound on the left plantar  foot. This is where he is undermining area is I have obtained a culture for CandS and I am going to give him empiric antibiotics. He has a complicated wound history including a right BKA and a left transmetatarsal amputation. The wound is roughly on the third met head  remanent 12/7; patient has not been here in 3 weeks. I think this is a transportation issue. I gave him doxycycline last time is here. The culture showed strep and he was changed to Augmentin in my absence. He he is completing this. X-ray however raised the suspicion of osteomyelitis and he has an MRI booked for Wednesday i.e. in 2 days. We will use calcium alginate until then 12/14; patient comes in today with purulent drainage noted by her intake nurses. He is completing Augmentin that was given for him last time. His MRI was done on 12/9; this showed osteomyelitis of the remanent of the shaft of the second metatarsal. We have been using silver alginate Electronic Signature(s) Signed: 07/15/2019 5:56:50 PM By: [name] MD Entered By: [name] on 07/15/2019 12:58:38 -------------------------------------------------------------------------------- Physical Exam Details Patient Name: Date of Service: Palmer, Cory 07/15/2019 10:15 AM Medical Record [id number] Patient Account Number: [id number] Date of Birth/Sex: 12/11/1960 (58 y.o. M) Treating RN: Levan Hurst Primary Care Provider: PATIENT, NO Other Clinician: Referring Provider: Treating Provider/Extender:, [name] in Treatment: 9 Constitutional Sitting or standing Blood Pressure is within target range for patient.. Pulse regular and within target range for patient.Marland Kitchen Respirations regular, non-labored and within target range.. Temperature is normal and within the target range for the patient.Marland Kitchen Appears in no distress. Eyes Conjunctivae clear. No discharge.no icterus. Respiratory work of breathing is normal. Cardiovascular Pedal pulses easily palpable. Integumentary (Hair, Skin) No surrounding erythema. Psychiatric appears at normal baseline. Notes Wound exam; wound looks exactly the same. There is no undermining no palpable bone and no tunneling. No debridement was necessary today. Electronic  Signature(s) Signed: 07/15/2019 5:56:50 PM By: [name] MD Entered By: [name] on 07/15/2019 13:02:10 -------------------------------------------------------------------------------- Physician Orders Details Patient Name: Date of Service: MARSEL, GAIL 07/15/2019 10:15 AM Medical Record [id number] Patient Account Number: [id number] Date of Birth/Sex: 08/31/1960 (58 y.o. M) Treating RN: Levan Hurst Primary Care Provider: PATIENT, NO Other Clinician: Referring Provider: Treating Provider/Extender:, [name] in Treatment: 9 Verbal / Phone Orders: No Diagnosis Coding ICD-10 Coding Code Description E11.621 Type 2 diabetes mellitus with foot ulcer E11.42 Type 2 diabetes mellitus with diabetic polyneuropathy Z89.511 Acquired absence of right leg below knee M86.672 Other chronic osteomyelitis, left ankle and foot Follow-up Appointments Return Appointment in 1 week. Dressing Change Frequency Wound #1 Left,Plantar Foot Change Dressing every other day. Skin Barriers/Peri-Wound Care Moisturizing lotion - to left leg daily. Wound Cleansing Wound #1 Left,Plantar Foot May shower and wash wound with soap and water. - with dressing changes. Primary Wound Dressing Wound #1 Left,Plantar Foot Calcium Alginate with Silver Secondary Dressing Wound #1 Left,Plantar Foot Kerlix/Rolled Gauze ABD pad Other: - felt callous pad Edema Control Avoid standing for long periods of time Elevate legs to the level of the heart or above for 30 minutes daily and/or when sitting, a frequency of: - throughout the day. Additional Orders / Instructions Stop/Decrease Smoking Follow Nutritious Diet - increase protein and vegetables in diabetic diet. Consults Infectious Disease - ASAP - Osteomyelitis of left foot - (ICD10 E11.621 - Type 2 diabetes mellitus with foot ulcer) [facility] - Dr. Sharol Given - Dr. Sharol Given - Osteomyelitis of left foot - (ICD10 E11.621 -  Type  2 diabetes mellitus with foot ulcer) Electronic Signature(s) Signed: 07/15/2019 5:56:50 PM By: [name] MD Signed: 07/15/2019 6:02:21 PM By: Levan Hurst RN, BSN Entered By: Levan Hurst on 07/15/2019 11:17:51 -------------------------------------------------------------------------------- Prescription 07/15/2019 Patient Name: JEROL, RUFENER B. Provider: [name] MD Date of Birth: 03-14-1961 NPI#: [id number] Sex: Jerilynn Mages DEA#: [id number] Phone #: [phone] License #: [id number] Patient Address: [facility] [address] [place], [place] [address] [place], [place] [address] [phone] Allergies tramadol Provider's Orders Infectious Disease - ICD10: E11.621 - ASAP - Osteomyelitis of left foot Signature(s): Date(s): Prescription 07/15/2019 Patient Name: MILAN, PERKINS. Provider: [name] MD Date of Birth: 1960/12/15 NPI#: [id number] Sex: Jerilynn Mages DEA#: [id number] Phone #: [phone] License #: [id number] Patient Address: [facility] [address] [place], [place] [address] [place], [place] [address] [phone] Allergies tramadol Provider's Orders Orthopedics - Dr. Sharol Given - ICD10: E11.621 - Dr. Sharol Given - Osteomyelitis of left foot Signature(s): Date(s): Electronic Signature(s) Signed: 07/15/2019 5:56:50 PM By: [name] MD Signed: 07/15/2019 6:02:21 PM By: Levan Hurst RN, BSN Entered By: Levan Hurst on 07/15/2019 11:17:52 --------------------------------------------------------------------------------  Problem List Details Patient Name: Date of Service: WAKE, CONLEE 07/15/2019 10:15 AM Medical Record [id number] Patient Account Number: [id number] Date of Birth/Sex: 03-Feb-1961 (58 y.o. M) Treating RN: Levan Hurst Primary Care Provider: PATIENT, NO Other Clinician: Referring Provider: Treating  Provider/Extender:, [name] in Treatment: 9 Active Problems ICD-10 Evaluated Encounter Code Description Active Date Today Diagnosis E11.621 Type 2 diabetes mellitus with foot ulcer 05/09/2019 No Yes E11.42 Type 2 diabetes mellitus with diabetic polyneuropathy 05/09/2019 No Yes Z89.511 Acquired absence of right leg below knee 05/09/2019 No Yes M86.672 Other chronic osteomyelitis, left ankle and foot 05/09/2019 No Yes Inactive Problems Resolved Problems Electronic Signature(s) Signed: 07/15/2019 5:56:50 PM By: [name] MD Entered By: [name] on 07/15/2019 12:55:57 -------------------------------------------------------------------------------- Progress Note Details Patient Name: Date of Service: [name] 07/15/2019 10:15 AM Medical Record [id number] Patient Account Number: [id number] Date of Birth/Sex: Apr 05, 1961 (58 y.o. M) Treating RN: Levan Hurst Primary Care Provider: PATIENT, NO Other Clinician: Referring Provider: Treating Provider/Extender:, [name] in Treatment: 9 Subjective History of Present Illness (HPI) ADMISSION 05/09/2019 This is a 58 year old man with type 2 diabetes. He has had a previous right BKA done in [place] 4 years ago. Looking back through [place] [facility] this year he had a left fourth toe wound in January 2020. By looking at the pictures he had also had previous amputation of the first second and third toes as well as a right BKA. 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 Sharol Given on 09/28/2018. I believe at some point he dehisced the amputation as he had a wound VAC in early March. He was admitted to hospital from 10/04/2018 through 10/18/2018 with MSSA/strep sepsis. A TEE was negative. I do not believe during this admission he was felt to have osteomyelitis however an x-ray on 10/28/2018 showed concern for osteomyelitis at the remaining second third and fourth metatarsals. An x-ray  on 12/19/2018 showed progressive new bone formation at the transmetatarsal amputation sites of the second through fifth digits consistent with either acute or chronic osteomyelitis. It is hard to tell although after the discharge in March it looks as though he had a course of vancomycin and may be oral antibiotics although I am not sure about this or  the duration of antibiotic therapy. The patient states that the wound opened up several months ago. It was clear that there was a wound on the plantar left foot on 12/27/2018. He was in the ER on 9/20 with a large wound on the left plantar foot given Silvadene. The patient states he has been putting bizarre combinations of things on this wound including castor oil. Past medical history; congestive heart failure, type 2 diabetes, bipolar disorder, right BKA, remote history of substance abuse, atrial fibrillation Arterial studies done on the left and 05/06/2018 showed an ABI of 1.21 with triphasic waveforms. 10/15; I did talk with Dr. Darron Doom and asked her to order a CRP and sedimentation rate. Based on the lab work that she is doing inflammatory markers white count etc. we will order an MRI of the left foot [see discussion above]. She actually stated that she has not really been following him regularly so was not really aware of what had been done in March. His wound actually looks somewhat better today and slightly smaller in terms of surface area 10/22; surprisingly the patient's labs that I ordered with Dr. Dennie Fetters assistance were not that bad. White count was 4.9 differential count normal. Sedimentation rate was 23 comprehensive metabolic panel was normal. Hemoglobin A1c 5.9 I thought I saw the C-reactive protein but I cannot put my finger on this right at the moment. His wound actually looks somewhat better measures better. He did however have some fluorescent green drainage 11/5; the patient's wound is come in a centimeter in width. We have been  using silver alginate he has been changing the dressing himself.. We do not have a way to aggressively offload this as he will has a right leg prosthesis. Nursing still reports green drainage. I gave him antibiotics for this 11/19; the patient's wound came in again this time however he is complaining of pain in the inferior part of the wound on the left plantar foot. This is where he is undermining area is I have obtained a culture for CandS and I am going to give him empiric antibiotics. He has a complicated wound history including a right BKA and a left transmetatarsal amputation. The wound is roughly on the third met head remanent 12/7; patient has not been here in 3 weeks. I think this is a transportation issue. I gave him doxycycline last time is here. The culture showed strep and he was changed to Augmentin in my absence. He he is completing this. X-ray however raised the suspicion of osteomyelitis and he has an MRI booked for Wednesday i.e. in 2 days. We will use calcium alginate until then 12/14; patient comes in today with purulent drainage noted by her intake nurses. He is completing Augmentin that was given for him last time. His MRI was done on 12/9; this showed osteomyelitis of the remanent of the shaft of the second metatarsal. We have been using silver alginate Objective Constitutional Sitting or standing Blood Pressure is within target range for patient.. Pulse regular and within target range for patient.Marland Kitchen Respirations regular, non-labored and within target range.. Temperature is normal and within the target range for the patient.Marland Kitchen Appears in no distress. Vitals Time Taken: 10:25 AM, Height: 76 in, Weight: 240 lbs, BMI: 29.2, Temperature: 97.8 F, Pulse: 64 bpm, Respiratory Rate: 19 breaths/min, Blood Pressure: 135/78 mmHg. Eyes Conjunctivae clear. No discharge.no icterus. Respiratory work of breathing is normal. Cardiovascular Pedal pulses easily  palpable. Psychiatric appears at normal baseline. General Notes: Wound exam; wound looks  exactly the same. There is no undermining no palpable bone and no tunneling. No debridement was necessary today. Integumentary (Hair, Skin) No surrounding erythema. Wound #1 status is Open. Original cause of wound was Gradually Appeared. The wound is located on the [place]. The wound measures 3.3cm length x 1.8cm width x 0.3cm depth; 4.665cm^2 area and 1.4cm^3 volume. There is Fat Layer (Subcutaneous Tissue) Exposed exposed. There is no tunneling or undermining noted. There is a medium amount of serosanguineous drainage noted. The wound margin is thickened. There is large (67- 100%) pink granulation within the wound bed. There is no necrotic tissue within the wound bed. General Notes: green/blue drainage present. 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 Dressing Change Frequency: Wound #1 Left,Plantar Foot: Change Dressing every other day. Skin Barriers/Peri-Wound Care: Moisturizing lotion - to left leg daily. Wound Cleansing: Wound #1 Left,Plantar Foot: May shower and wash wound with soap and water. - with dressing changes. Primary Wound Dressing: Wound #1 Left,Plantar Foot: Calcium Alginate with Silver Secondary Dressing: Wound #1 Left,Plantar Foot: Kerlix/Rolled Gauze ABD pad Other: - felt callous pad Edema Control: Avoid standing for long periods of time Elevate legs to the level of the heart or above for 30 minutes daily and/or when sitting, a frequency of: - throughout the day. Additional Orders / Instructions: Stop/Decrease Smoking Follow Nutritious Diet - increase protein and vegetables in diabetic diet. Consults ordered  were: Infectious Disease - ASAP - Osteomyelitis of left foot ordered were: Orthopedics - Dr. Sharol Given - Dr. Sharol Given - Osteomyelitis of left foot 1. We will get infectious disease and orthopedics consultations. 2. If he elects for medical treatment here then he will require IV antibiotics and hyperbaric oxygen. 3. I am sure this is largely an offloading issue caused by walking with a below-knee prosthesis on the right side with a wound on the plantar foot. 4. I do not believe there is an arterial issue here Electronic Signature(s) Signed: 07/15/2019 5:56:50 PM By: [name] MD Entered By: [name] on 07/15/2019 13:03:05 -------------------------------------------------------------------------------- SuperBill Details Patient Name: Date of Service: [name] 07/15/2019 Medical Record [id number] Patient Account Number: [id number] Date of Birth/Sex: Treating RN: 1961/02/04 (58 y.o. [name] Primary Care Provider: PATIENT, NO Other Clinician: Referring Provider: Treating Provider/Extender:Able Malloy, [name] in Treatment: 9 Diagnosis Coding ICD-10 Codes Code Description E11.621 Type 2 diabetes mellitus with foot ulcer E11.42 Type 2 diabetes mellitus with diabetic polyneuropathy Z89.511 Acquired absence of right leg below knee M86.672 Other chronic osteomyelitis, left ankle and foot Facility Procedures The patient participates with Medicare or their insurance follows the Medicare Facility Guidelines: CPT4 Code Description Modifier Quantity [id number] [place] VISIT-LEV 3 EST PT 1 Physician Procedures CPT4 Code: [id number] Description: 66599 - WC PHYS LEVEL 3 - EST PT ICD-10 Diagnosis Description E11.621 Type 2 diabetes mellitus with foot ulcer M86.672 Other chronic osteomyelitis, left ankle and foot Modifier: Quantity: 1 Electronic Signature(s) Signed: 07/15/2019 5:56:50 PM By: [name] MD Signed: 07/15/2019 6:02:21 PM By: Levan Hurst RN,  BSN Entered By: Levan Hurst on 07/15/2019 17:22:37

## 2019-07-15 NOTE — Progress Notes (Addendum)
Cory Palmer, Cory Palmer ([id number]) Visit Report for 07/15/2019 Arrival Information Details Patient Name: Date of Service: Cory Palmer, Cory Palmer 07/15/2019 10:15 AM Medical Record [id number] Patient Account Number: [id number] Date of Birth/Sex: Treating RN: 11-22-1960 (58 y.o. Cory Palmer Primary Care Poonam Woehrle: PATIENT, NO Other Clinician: Referring Jupiter Boys: Treating Barba Solt/Extender:Robson, [name] in Treatment: 9 Visit Information History Since Last Visit Cane Added or deleted any medications: No Patient Arrived: 10:26 Any new allergies or adverse reactions: No Arrival Time: Had a fall or experienced change in No Accompanied By: self None activities of daily living that may affect Transfer Assistance: risk of falls: Patient Identification Verified: Yes Signs or symptoms of abuse/neglect since last No Secondary Verification Process Completed: Yes visito Patient Requires Transmission-Based No Hospitalized since last visit: No Precautions: Implantable device outside of the clinic excluding No Patient Has Alerts: No cellular tissue based products placed in the center since last visit: Has Dressing in Place as Prescribed: Yes Pain Present Now: No Electronic Signature(s) Signed: 07/15/2019 5:56:42 PM By: Kela Millin Entered By: Kela Millin on 07/15/2019 10:26:52 -------------------------------------------------------------------------------- Clinic Level of Care Assessment Details Patient Name: Date of Service: Cory Palmer 07/15/2019 10:15 AM Medical Record [id number] Patient Account Number: [id number] Date of Birth/Sex: Treating RN: 1961/04/16 (58 y.o. Cory Palmer Primary Care Jesica Goheen: PATIENT, NO Other Clinician: Referring Caoimhe Damron: Treating Mylene Bow/Extender:Robson, [name] in Treatment: 9 Clinic Level of Care Assessment Items TOOL 4 Quantity Score X - Use when only an EandM is performed on FOLLOW-UP visit 1  0 ASSESSMENTS - Nursing Assessment / Reassessment X - Reassessment of Co-morbidities (includes updates in patient status) 1 10 X - Reassessment of Adherence to Treatment Plan 1 5 ASSESSMENTS - Wound and Skin Assessment / Reassessment X - Simple Wound Assessment / Reassessment - one wound 1 5 _0  - Complex Wound Assessment / Reassessment - multiple wounds 0 _1  - Dermatologic / Skin Assessment (not related to wound area) 0 ASSESSMENTS - Focused Assessment _2  - Circumferential Edema Measurements - multi extremities 0 _3  - Nutritional Assessment / Counseling / Intervention 0 X - Lower Extremity Assessment (monofilament, tuning fork, pulses) 1 5 _4  - Peripheral Arterial Disease Assessment (using hand held doppler) 0 ASSESSMENTS - Ostomy and/or Continence Assessment and Care _5  - Incontinence Assessment and Management 0 _6  - Ostomy Care Assessment and Management (repouching, etc.) 0 PROCESS - Coordination of Care X - Simple Patient / Family Education for ongoing care 1 15 _7  - Complex (extensive) Patient / Family Education for ongoing care 0 X - Staff obtains Programmer, systems, Records, Test Results / Process Orders 1 10 _8  - Staff telephones HHA, Nursing Homes / Clarify orders / etc 0 _9  - Routine Transfer to another Facility (non-emergent condition) 0 _10  - Routine Hospital Admission (non-emergent condition) 0 _11  - New Admissions / Biomedical engineer / Ordering NPWT, Apligraf, etc. 0 _12  - Emergency Hospital Admission (emergent condition) 0 X - Simple Discharge Coordination 1 10 _13  - Complex (extensive) Discharge Coordination 0 PROCESS - Special Needs _14  - Pediatric / Minor Patient Management 0 _15  - Isolation Patient Management 0 _16  - Hearing / Language / Visual special needs 0 _17  - Assessment of Community assistance (transportation, D/C planning, etc.) 0 _18  - Additional assistance / Altered mentation 0 _19  - Support Surface(s) Assessment (bed, cushion, seat, etc.) 0 INTERVENTIONS - Wound  Cleansing / Measurement X - Simple Wound Cleansing - one wound 1 5 _20  - Complex Wound Cleansing - multiple wounds 0 X - Wound Imaging (photographs - any number of  wounds) 1 5 _0  - Wound Tracing (instead of photographs) 0 X - Simple Wound Measurement - one wound 1 5 _1  - Complex Wound Measurement - multiple wounds 0 INTERVENTIONS - Wound Dressings _2  - Small Wound Dressing one or multiple wounds 0 X - Medium Wound Dressing one or multiple wounds 1 15 _3  - Large Wound Dressing one or multiple wounds 0 X - Application of Medications - topical 1 5 <[id number]>_1<\/[id number]>_4  - Application of Medications - injection 0 INTERVENTIONS - Miscellaneous _5  - External ear exam 0 _6  - Specimen Collection (cultures, biopsies, blood, body fluids, etc.) 0 _7  - Specimen(s) / Culture(s) sent or taken to Lab for analysis 0 _8  - Patient Transfer (multiple staff / Harrel Lemon Lift / Similar devices) 0 _9  - Simple Staple / Suture removal (25 or less) 0 _10  - Complex Staple / Suture removal (26 or more) 0 _11  - Hypo / Hyperglycemic Management (close monitor of Blood Glucose) 0 _12  - Ankle / Brachial Index (ABI) - do not check if billed separately 0 X - Vital Signs 1 5 Has the patient been seen at the hospital within the last three years: Yes Total Score: 100 Level Of Care: New/Established - Level 3 Electronic Signature(s) Signed: 07/15/2019 6:02:21 PM By: Levan Hurst RN, BSN Entered By: Levan Hurst on 07/15/2019 17:22:28 -------------------------------------------------------------------------------- Encounter Discharge Information Details Patient Name: Date of Service: Cory Palmer 07/15/2019 10:15 AM Medical Record [id number] Patient Account Number: [id number] Date of Birth/Sex: Treating RN: [birth year] (58 y.o. [name] Primary Care Maurion Walkowiak: PATIENT, NO Other Clinician: Referring Taisha Pennebaker: Treating Lyne Khurana/Extender:Robson, [name] in Treatment: 9 Encounter Discharge Information Items Discharge  Condition: Stable Ambulatory Status: Cane Discharge Destination: Home Transportation: Private Auto Accompanied By: self Schedule Follow-up Appointment: Yes Clinical Summary of Care: Electronic Signature(s) Signed: 07/15/2019 5:58:58 PM By: Deon Pilling Entered By: Deon Pilling on 07/15/2019 11:30:05 -------------------------------------------------------------------------------- Lower Extremity Assessment Details Patient Name: Date of Service: Cory Palmer, Cory Palmer 07/15/2019 10:15 AM Medical Record [id number] Patient Account Number: [id number] Date of Birth/Sex: Treating RN: 1961/03/27 (58 y.o. Cory Palmer Primary Care Dmonte Maher: PATIENT, NO Other Clinician: Referring Rayette Mogg: Treating Tristan Proto/Extender:Robson, [name] in Treatment: 9 Edema Assessment Assessed: [Left: No] [Right: No] Edema: [Left: Ye] [Right: s] Calf Left: Right: Point of Measurement: 43 cm From Medial Instep 39.8 cm cm Ankle Left: Right: Point of Measurement: 10 cm From Medial Instep 26 cm cm Electronic Signature(s) Signed: 07/15/2019 5:56:42 PM By: Kela Millin Entered By: Kela Millin on 07/15/2019 10:28:15 -------------------------------------------------------------------------------- Multi Wound Chart Details Patient Name: Date of Service: Cory Palmer 07/15/2019 10:15 AM Medical Record [id number] Patient Account Number: [id number] Date of Birth/Sex: Treating RN: 1960-11-29 (58 y.o. Cory Palmer Primary Care Durward Matranga: PATIENT, NO Other Clinician: Referring Zerina Hallinan: Treating Melayah Skorupski/Extender:Robson, [name] in Treatment: 9 Vital Signs Height(in): 76 Pulse(bpm): 64 Weight(lbs): 240 Blood Pressure(mmHg): 135/78 Body Mass Index(BMI): 29 Temperature(F): 97.8 Respiratory 19 Rate(breaths/min): Photos: [1:No Photos] [N/A:N/A] Wound Location: [1:Left Foot - Plantar] [N/A:N/A] Wounding Event: [1:Gradually Appeared] [N/A:N/A] Primary Etiology:  [1:Diabetic Wound/Ulcer of the N/A Lower Extremity] Comorbid History: [1:Hypertension, Type II Diabetes, Osteomyelitis] [N/A:N/A] Date Acquired: [1:05/04/2018] [N/A:N/A] Weeks of Treatment: [1:9] [N/A:N/A] Wound Status: [1:Open] [N/A:N/A] Measurements L x W x D 3.3x1.8x0.3 [N/A:N/A] (cm) Area (cm) : [1:4.665] [N/A:N/A] Volume (cm) : [1:1.4] [N/A:N/A] % Reduction in Area: [1:61.60%] [N/A:N/A] % Reduction in Volume: 42.40% [N/A:N/A] Classification: [1:Grade 3] [N/A:N/A] Exudate Amount: [1:Medium] [N/A:N/A] Exudate Type: [1:Serosanguineous] [N/A:N/A] Exudate Color: [1:red, brown] [N/A:N/A] Wound Margin: [1:Thickened] [N/A:N/A] Granulation Amount: [1:Large (67-100%)] [N/A:N/A] Granulation  Quality: [1:Pink] [N/A:N/A] Necrotic Amount: [1:None Present (0%)] [N/A:N/A] Exposed Structures: [1:Fat Layer (Subcutaneous N/A Tissue) Exposed: Yes Fascia: No Tendon: No Muscle: No Joint: No Bone: No] Epithelialization: [1:Small (1-33%)] [N/A:N/A] Assessment Notes: [1:green/blue drainage present. N/A MD notified] Treatment Notes Wound #1 (Left, Plantar Foot) [1:1. Cleanse With Wound Cleanser 2. Periwound Care Moisturizing lotion 3. Primary Dressing Applied Calcium Alginate Ag 4. Secondary Dressing ABD Pad Dry Gauze Roll Gauze 5. Secured With Tape] Notes explained the referrals and dressings. patient in agreement. Electronic Signature(s) Signed: 07/15/2019 5:56:50 PM By: [name] MD Signed: 07/15/2019 6:02:21 PM By: Levan Hurst RN, BSN Entered By: [name] on 07/15/2019 12:56:12 -------------------------------------------------------------------------------- Multi-Disciplinary Care Plan Details Patient Name: Date of Service: Cory Palmer, Cory Palmer 07/15/2019 10:15 AM Medical Record [id number] Patient Account Number: [id number] Date of Birth/Sex: Treating RN: November 28, 1960 (58 y.o. Cory Palmer Primary Care Tennyson Kallen: PATIENT, NO Other Clinician: Referring Deania Siguenza: Treating  Rowan Blaker/Extender:Robson, [name] in Treatment: 9 Active Inactive Nutrition Nursing Diagnoses: Impaired glucose control: actual or potential Potential for alteratiion in Nutrition/Potential for imbalanced nutrition Goals: Patient/caregiver agrees to and verbalizes understanding of need to obtain nutritional consultation Date Inactivated: 07/08/2019 Target Resolution Date Initiated: 05/09/2019 Date: 06/28/2019 Goal Status: Met Patient/caregiver will maintain therapeutic glucose control Date Initiated: 05/09/2019 Target Resolution Date: 08/09/2019 Goal Status: Active Interventions: Assess HgA1c results as ordered upon admission and as needed Provide education on elevated blood sugars and impact on wound healing Provide education on nutrition Treatment Activities: Education provided on Nutrition : 07/08/2019 Patient referred to Primary Care Physician for further nutritional evaluation : 05/09/2019 Notes: Wound/Skin Impairment Nursing Diagnoses: Knowledge deficit related to ulceration/compromised skin integrity Goals: Patient/caregiver will verbalize understanding of skin care regimen Date Initiated: 05/09/2019 Target Resolution Date: 08/09/2019 Goal Status: Active Interventions: Assess patient/caregiver ability to perform ulcer/skin care regimen upon admission and as needed Assess ulceration(s) every visit Provide education on ulcer and skin care Treatment Activities: Skin care regimen initiated : 05/09/2019 Topical wound management initiated : 05/09/2019 Notes: Electronic Signature(s) Signed: 07/15/2019 6:02:21 PM By: Levan Hurst RN, BSN Entered By: Levan Hurst on 07/15/2019 17:21:35 -------------------------------------------------------------------------------- Pain Assessment Details Patient Name: Date of Service: Cory Palmer, Cory Palmer 07/15/2019 10:15 AM Medical Record [id number] Patient Account Number: [id number] Date of Birth/Sex: Treating RN: 07/31/1961 (58  y.o. Cory Palmer Primary Care Roniyah Llorens: PATIENT, NO Other Clinician: Referring Amulya Quintin: Treating Kariann Wecker/Extender:Robson, [name] in Treatment: 9 Active Problems Location of Pain Severity and Description of Pain Patient Has Paino No Site Locations Pain Management and Medication Current Pain Management: Electronic Signature(s) Signed: 07/15/2019 5:56:42 PM By: Kela Millin Entered By: Kela Millin on 07/15/2019 10:27:57 -------------------------------------------------------------------------------- Patient/Caregiver Education Details Patient Name: Cory Palmer 12/14/2020andnbsp10:15 Date of Service: AM Medical Record [id number] Number: Patient Account Number: [id number] Treating RN: Date of Birth/Gender: Oct 31, 1960 (58 y.o. Cory Palmer) Other Clinician: Primary Care Physician:PATIENT, NO Treating [name] Referring Physician: Physician/Extender: Suella Grove in Treatment: 9 Education Assessment Education Provided To: Patient Education Topics Provided Elevated Blood Sugar/ Impact on Healing: Methods: Explain/Verbal Responses: State content correctly Wound/Skin Impairment: Methods: Explain/Verbal Responses: State content correctly Electronic Signature(s) Signed: 07/15/2019 6:02:21 PM By: Levan Hurst RN, BSN Entered By: Levan Hurst on 07/15/2019 17:22:00 -------------------------------------------------------------------------------- Wound Assessment Details Patient Name: Date of Service: Cory Palmer, Cory Palmer 07/15/2019 10:15 AM Medical Record [id number] Patient Account Number: [id number] Date of Birth/Sex: Treating RN: 1960/12/26 (58 y.o. Cory Palmer Primary Care Jushua Waltman: PATIENT, NO Other Clinician: Referring Everton Bertha: Treating Tinslee Klare/Extender:Robson, [name] in Treatment: 9 Wound Status Wound Number: 1 Primary Diabetic  Wound/Ulcer of the Lower Etiology: Extremity Wound Location: Left Foot -  Plantar Wound Status: Open Wounding Event: Gradually Appeared Comorbid Hypertension, Type II Diabetes, Date Acquired: 05/04/2018 History: Osteomyelitis Weeks Of Treatment: 9 Clustered Wound: No Photos Wound Measurements Length: (cm) 3.3 % Reduction in Ar Width: (cm) 1.8 % Reduction in Vo Depth: (cm) 0.3 Epithelialization Area: (cm) 4.665 Tunneling: Volume: (cm) 1.4 Undermining: Wound Description Classification: Grade 3 Foul Odor After Wound Margin: Thickened Slough/Fibrino Exudate Amount: Medium Exudate Type: Serosanguineous Exudate Color: red, brown Wound Bed Granulation Amount: Large (67-100%) Granulation Quality: Pink Fascia Exposed: Necrotic Amount: None Present (0%) Fat Layer (Subcu Tendon Exposed: Muscle Exposed: Joint Exposed: Bone Exposed: Assessment Notes green/blue drainage present. MD notified Treatment Notes Wound #1 (Left, Plantar Foot) 1. Cleanse With Wound Cleanser 2. Periwound Care Moisturizing lotion 3. Primary Dressing Applied Calcium Alginate Ag 4. Secondary Dressing ABD Pad Dry Gauze Roll Gauze 5. Secured With Tape Cleansing: No No Exposed Structure No taneous Tissue) Exposed: Yes No No No No ea: 61.6% lume: 42.4% : Small (1-33%) No No Notes explained the referrals and dressings. patient in agreement. Electronic Signature(s) Signed: 07/16/2019 3:57:04 PM By: Mikeal Hawthorne EMT/HBOT Signed: 07/18/2019 5:33:40 PM By: Kela Millin Previous Signature: 07/15/2019 5:56:42 PM Version By: Kela Millin Entered By: Mikeal Hawthorne on 07/16/2019 14:41:42 -------------------------------------------------------------------------------- Vitals Details Patient Name: Date of Service: Cory Palmer, Cory Palmer 07/15/2019 10:15 AM Medical Record [id number] Patient Account Number: [id number] Date of Birth/Sex: Treating RN: June 09, 1961 (58 y.o. Cory Palmer Primary Care Averly Ericson: PATIENT, NO Other Clinician: Referring  Hedaya Latendresse: Treating Thurlow Gallaga/Extender:Robson, [name] in Treatment: 9 Vital Signs Time Taken: 10:25 Temperature (F): 97.8 Height (in): 76 Pulse (bpm): 64 Weight (lbs): 240 Respiratory Rate (breaths/min): 19 Body Mass Index (BMI): 29.2 Blood Pressure (mmHg): 135/78 Reference Range: 80 - 120 mg / dl Electronic Signature(s) Signed: 07/15/2019 5:56:42 PM By: Kela Millin Entered By: Kela Millin on 07/15/2019 10:27:39

## 2019-07-22 ENCOUNTER — Other Ambulatory Visit: Payer: Self-pay

## 2019-07-22 ENCOUNTER — Encounter (HOSPITAL_BASED_OUTPATIENT_CLINIC_OR_DEPARTMENT_OTHER): Payer: Medicare Other | Admitting: Internal Medicine

## 2019-07-22 DIAGNOSIS — E11621 Type 2 diabetes mellitus with foot ulcer: Secondary | ICD-10-CM | POA: Diagnosis not present

## 2019-07-23 ENCOUNTER — Encounter: Payer: Self-pay | Admitting: Orthopedic Surgery

## 2019-07-23 ENCOUNTER — Other Ambulatory Visit: Payer: Self-pay

## 2019-07-23 ENCOUNTER — Ambulatory Visit (INDEPENDENT_AMBULATORY_CARE_PROVIDER_SITE_OTHER): Payer: Medicare Other | Admitting: Orthopedic Surgery

## 2019-07-23 VITALS — Ht 75.0 in | Wt 260.0 lb

## 2019-07-23 DIAGNOSIS — M86272 Subacute osteomyelitis, left ankle and foot: Secondary | ICD-10-CM | POA: Diagnosis not present

## 2019-07-23 NOTE — Progress Notes (Signed)
Cory Palmer, Cory Palmer ([id number]) Visit Report for 07/22/2019 HPI Details Patient Name: Date of Service: Cory Palmer 07/22/2019 1:15 PM Medical Record [id number] Patient Account Number: [id number] Date of Birth/Sex: Treating RN: 1961/06/26 (58 y.o. M) Primary Care Provider: PATIENT, NO Other Clinician: Referring Provider: Treating Provider/Extender:Oleg Oleson, [name] in Treatment: 10 History of Present Illness HPI Description: ADMISSION 05/09/2019 This is a 59 year old man with type 2 diabetes. He has had a previous right BKA done in [place] 4 years ago. Looking back through [place] link this year he had a left fourth toe wound in January 2020. By looking at the pictures he had also had previous amputation of the first second and third toes as well as a right BKA. 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 Sharol Given on 09/28/2018. I believe at some point he dehisced the amputation as he had a wound VAC in early March. He was admitted to hospital from 10/04/2018 through 10/18/2018 with MSSA/strep sepsis. A TEE was negative. I do not believe during this admission he was felt to have osteomyelitis however an x-ray on 10/28/2018 showed concern for osteomyelitis at the remaining second third and fourth metatarsals. An x-ray on 12/19/2018 showed progressive new bone formation at the transmetatarsal amputation sites of the second through fifth digits consistent with either acute or chronic osteomyelitis. It is hard to tell although after the discharge in March it looks as though he had a course of vancomycin and may be oral antibiotics although I am not sure about this or the duration of antibiotic therapy. The patient states that the wound opened up several months ago. It was clear that there was a wound on the plantar left foot on 12/27/2018. He was in the ER on 9/20 with a large wound on the left plantar foot given Silvadene. The patient states he has been  putting bizarre combinations of things on this wound including castor oil. Past medical history; congestive heart failure, type 2 diabetes, bipolar disorder, right BKA, remote history of substance abuse, atrial fibrillation Arterial studies done on the left and 05/06/2018 showed an ABI of 1.21 with triphasic waveforms. 10/15; I did talk with Dr. Darron Doom and asked her to order a CRP and sedimentation rate. Based on the lab work that she is doing inflammatory markers white count etc. we will order an MRI of the left foot [see discussion above]. She actually stated that she has not really been following him regularly so was not really aware of what had been done in March. His wound actually looks somewhat better today and slightly smaller in terms of surface area 10/22; surprisingly the patient's labs that I ordered with Dr. Dennie Fetters assistance were not that bad. White count was 4.9 differential count normal. Sedimentation rate was 23 comprehensive metabolic panel was normal. Hemoglobin A1c 5.9 I thought I saw the C-reactive protein but I cannot put my finger on this right at the moment. His wound actually looks somewhat better measures better. He did however have some fluorescent green drainage 11/5; the patient's wound is come in a centimeter in width. We have been using silver alginate he has been changing the dressing himself.. We do not have a way to aggressively offload this as he will has a right leg prosthesis. Nursing still reports green drainage. I gave him antibiotics for this 11/19; the patient's wound came in again this time however he is complaining of pain in the inferior part of the wound on the left plantar foot. This  is where he is undermining area is I have obtained a culture for CandS and I am going to give him empiric antibiotics. He has a complicated wound history including a right BKA and a left transmetatarsal amputation. The wound is roughly on the third met head  remanent 12/7; patient has not been here in 3 weeks. I think this is a transportation issue. I gave him doxycycline last time is here. The culture showed strep and he was changed to Augmentin in my absence. He he is completing this. X-ray however raised the suspicion of osteomyelitis and he has an MRI booked for Wednesday i.e. in 2 days. We will use calcium alginate until then 12/14; patient comes in today with purulent drainage noted by her intake nurses. He is completing Augmentin that was given for him last time. His MRI was done on 12/9; this showed osteomyelitis of the remanent of the shaft of the second metatarsal. We have been using silver alginate 12/21; wound is on the plantar aspect of his foot. I do not see any evidence of current infection. His MRI done early this month showed osteomyelitis of the remanent of the shaft of the second metatarsal. He sees Dr. Sharol Given tomorrow. He has an infectious disease appointment on January 4. I am hopeful that we can make a decision about how to approach this. Hopefully with some foot preservation Electronic Signature(s) Signed: 07/23/2019 7:47:18 AM By: [name] MD Entered By: [name] on 07/22/2019 14:24:33 -------------------------------------------------------------------------------- Physical Exam Details Patient Name: Date of Service: Cory Palmer 07/22/2019 1:15 PM Medical Record [id number] Patient Account Number: [id number] Date of Birth/Sex: Treating RN: 1960-12-10 (58 y.o. M) Primary Care Provider: PATIENT, NO Other Clinician: Referring Provider: Treating Provider/Extender:Kailyn Vanderslice, [name] in Treatment: 10 Constitutional Sitting or standing Blood Pressure is within target range for patient.. Supine Blood Pressure is within target range for patient.Marland Kitchen Respirations regular, non-labored and within target range.. Temperature is normal and within the target range for the patient.Marland Kitchen Appears in no  distress. Respiratory work of breathing is normal. Cardiovascular Pedal pulses palpable on the left. Integumentary (Hair, Skin) No erythema around the wound no purulent drainage. Psychiatric appears at normal baseline. Notes Wound exam; I do not see any change in this. Rolled edges. Surface does not look too bad. Certainly this does not look like it is close to healing. Rolled edges around the wound bed. Electronic Signature(s) Signed: 07/23/2019 7:47:18 AM By: [name] MD Entered By: [name] on 07/22/2019 14:26:16 -------------------------------------------------------------------------------- Physician Orders Details Patient Name: Date of Service: EZEKEIL, BETHEL 07/22/2019 1:15 PM Medical Record [id number] Patient Account Number: [id number] Date of Birth/Sex: Treating RN: 06/11/1961 (58 y.o. [name] Primary Care Provider: PATIENT, NO Other Clinician: Referring Provider: Treating Provider/Extender:Cejay Cambre, [name] in Treatment: 10 Verbal / Phone Orders: No Diagnosis Coding ICD-10 Coding Code Description E11.621 Type 2 diabetes mellitus with foot ulcer E11.42 Type 2 diabetes mellitus with diabetic polyneuropathy Z89.511 Acquired absence of right leg below knee M86.672 Other chronic osteomyelitis, left ankle and foot Follow-up Appointments Return Appointment in 1 week. Dressing Change Frequency Wound #1 Left,Plantar Foot Change Dressing every other day. Skin Barriers/Peri-Wound Care Moisturizing lotion - to left leg daily. Wound Cleansing Wound #1 Left,Plantar Foot May shower and wash wound with soap and water. - with dressing changes. Primary Wound Dressing Wound #1 Left,Plantar Foot Calcium Alginate with Silver Secondary Dressing Wound #1 Left,Plantar Foot Kerlix/Rolled Gauze ABD pad Other: - foam donut Edema Control Avoid standing for long periods of time  Elevate legs to the level of the heart or above for 30 minutes daily  and/or when sitting, a frequency of: - throughout the day. Additional Orders / Instructions Stop/Decrease Smoking Follow Nutritious Diet - increase protein and vegetables in diabetic diet. Electronic Signature(s) Signed: 07/22/2019 5:05:42 PM By: Baruch Gouty RN, BSN Signed: 07/23/2019 7:47:18 AM By: [name] MD Entered By: Baruch Gouty on 07/22/2019 13:46:46 -------------------------------------------------------------------------------- Problem List Details Patient Name: Date of Service: JERREMY, MAIONE 07/22/2019 1:15 PM Medical Record [id number] Patient Account Number: [id number] Date of Birth/Sex: Treating RN: 07/18/1961 (58 y.o. [name] Primary Care Provider: PATIENT, NO Other Clinician: Referring Provider: Treating Provider/Extender:Sia Gabrielsen, [name] in Treatment: 10 Active Problems ICD-10 Evaluated Encounter Code Description Active Date Today Diagnosis E11.621 Type 2 diabetes mellitus with foot ulcer 05/09/2019 No Yes E11.42 Type 2 diabetes mellitus with diabetic polyneuropathy 05/09/2019 No Yes Z89.511 Acquired absence of right leg below knee 05/09/2019 No Yes M86.672 Other chronic osteomyelitis, left ankle and foot 05/09/2019 No Yes Inactive Problems Resolved Problems Electronic Signature(s) Signed: 07/23/2019 7:47:18 AM By: [name] MD Entered By: [name] on 07/22/2019 14:22:10 -------------------------------------------------------------------------------- Progress Note Details Patient Name: Date of Service: [name] 07/22/2019 1:15 PM Medical Record [id number] Patient Account Number: [id number] Date of Birth/Sex: Treating RN: Apr 07, 1961 (58 y.o. M) Primary Care Provider: PATIENT, NO Other Clinician: Referring Provider: Treating Provider/Extender:Clevester Helzer, [name] in Treatment: 10 Subjective History of Present Illness (HPI) ADMISSION 05/09/2019 This is a 57 year old man with type 2 diabetes. He  has had a previous right BKA done in [place] 4 years ago. Looking back through [place] link this year he had a left fourth toe wound in January 2020. By looking at the pictures he had also had previous amputation of the first second and third toes as well as a right BKA. 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 Sharol Given on 09/28/2018. I believe at some point he dehisced the amputation as he had a wound VAC in early March. He was admitted to hospital from 10/04/2018 through 10/18/2018 with MSSA/strep sepsis. A TEE was negative. I do not believe during this admission he was felt to have osteomyelitis however an x-ray on 10/28/2018 showed concern for osteomyelitis at the remaining second third and fourth metatarsals. An x-ray on 12/19/2018 showed progressive new bone formation at the transmetatarsal amputation sites of the second through fifth digits consistent with either acute or chronic osteomyelitis. It is hard to tell although after the discharge in March it looks as though he had a course of vancomycin and may be oral antibiotics although I am not sure about this or the duration of antibiotic therapy. The patient states that the wound opened up several months ago. It was clear that there was a wound on the plantar left foot on 12/27/2018. He was in the ER on 9/20 with a large wound on the left plantar foot given Silvadene. The patient states he has been putting bizarre combinations of things on this wound including castor oil. Past medical history; congestive heart failure, type 2 diabetes, bipolar disorder, right BKA, remote history of substance abuse, atrial fibrillation Arterial studies done on the left and 05/06/2018 showed an ABI of 1.21 with triphasic waveforms. 10/15; I did talk with Dr. Darron Doom and asked her to order a CRP and sedimentation rate. Based on the lab work that she is doing inflammatory markers white count etc. we will order an MRI of the left foot  [see discussion above]. She actually  stated that she has not really been following him regularly so was not really aware of what had been done in March. His wound actually looks somewhat better today and slightly smaller in terms of surface area 10/22; surprisingly the patient's labs that I ordered with Dr. Dennie Fetters assistance were not that bad. White count was 4.9 differential count normal. Sedimentation rate was 23 comprehensive metabolic panel was normal. Hemoglobin A1c 5.9 I thought I saw the C-reactive protein but I cannot put my finger on this right at the moment. His wound actually looks somewhat better measures better. He did however have some fluorescent green drainage 11/5; the patient's wound is come in a centimeter in width. We have been using silver alginate he has been changing the dressing himself.. We do not have a way to aggressively offload this as he will has a right leg prosthesis. Nursing still reports green drainage. I gave him antibiotics for this 11/19; the patient's wound came in again this time however he is complaining of pain in the inferior part of the wound on the left plantar foot. This is where he is undermining area is I have obtained a culture for CandS and I am going to give him empiric antibiotics. He has a complicated wound history including a right BKA and a left transmetatarsal amputation. The wound is roughly on the third met head remanent 12/7; patient has not been here in 3 weeks. I think this is a transportation issue. I gave him doxycycline last time is here. The culture showed strep and he was changed to Augmentin in my absence. He he is completing this. X-ray however raised the suspicion of osteomyelitis and he has an MRI booked for Wednesday i.e. in 2 days. We will use calcium alginate until then 12/14; patient comes in today with purulent drainage noted by her intake nurses. He is completing Augmentin that was given for him last time. His MRI was  done on 12/9; this showed osteomyelitis of the remanent of the shaft of the second metatarsal. We have been using silver alginate 12/21; wound is on the plantar aspect of his foot. I do not see any evidence of current infection. His MRI done early this month showed osteomyelitis of the remanent of the shaft of the second metatarsal. He sees Dr. Sharol Given tomorrow. He has an infectious disease appointment on January 4. I am hopeful that we can make a decision about how to approach this. Hopefully with some foot preservation Objective Constitutional Sitting or standing Blood Pressure is within target range for patient.. Supine Blood Pressure is within target range for patient.Marland Kitchen Respirations regular, non-labored and within target range.. Temperature is normal and within the target range for the patient.Marland Kitchen Appears in no distress. Vitals Time Taken: 1:00 PM, Height: 76 in, Weight: 240 lbs, BMI: 29.2, Temperature: 98.2 F, Pulse: 67 bpm, Respiratory Rate: 19 breaths/min, Blood Pressure: 114/41 mmHg, Capillary Blood Glucose: 120 mg/dl. General Notes: patient stated last CBG was 120 Respiratory work of breathing is normal. Cardiovascular Pedal pulses palpable on the left. Psychiatric appears at normal baseline. General Notes: Wound exam; I do not see any change in this. Rolled edges. Surface does not look too bad. Certainly this does not look like it is close to healing. Rolled edges around the wound bed. Integumentary (Hair, Skin) No erythema around the wound no purulent drainage. Wound #1 status is Open. Original cause of wound was Gradually Appeared. The wound is located on the [place]. The wound measures 3.8cm length x  2cm width x 0.2cm depth; 5.969cm^2 area and 1.194cm^3 volume. There is Fat Layer (Subcutaneous Tissue) Exposed exposed. There is no tunneling or undermining noted. There is a medium amount of purulent drainage noted. The wound margin is thickened. There is large  (67-100%) pink granulation within the wound bed. There is no necrotic tissue within the wound bed. 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 Dressing Change Frequency: Wound #1 Left,Plantar Foot: Change Dressing every other day. Skin Barriers/Peri-Wound Care: Moisturizing lotion - to left leg daily. Wound Cleansing: Wound #1 Left,Plantar Foot: May shower and wash wound with soap and water. - with dressing changes. Primary Wound Dressing: Wound #1 Left,Plantar Foot: Calcium Alginate with Silver Secondary Dressing: Wound #1 Left,Plantar Foot: Kerlix/Rolled Gauze ABD pad Other: - foam donut Edema Control: Avoid standing for long periods of time Elevate legs to the level of the heart or above for 30 minutes daily and/or when sitting, a frequency of: - throughout the day. Additional Orders / Instructions: Stop/Decrease Smoking Follow Nutritious Diet - increase protein and vegetables in diabetic diet. 1. I am continue with silver alginate 2. Sees Dr. Sharol Given tomorrow. I am hopeful that there will be a foot conserving option here. 3. He sees infectious disease on January 4 which will possibly not be necessary if he opts for surgery 4. This really looks like an offloading issue. He is not able to offload this foot wearing a prosthesis on the right side. Electronic Signature(s) Signed: 07/23/2019 7:47:18 AM By: [name] MD Entered By: [name] on 07/22/2019 14:27:58 -------------------------------------------------------------------------------- SuperBill Details Patient Name: Date of Service: NATHANIEL, WAKELEY 07/22/2019 Medical Record [id number] Patient Account Number: [id number] Date of Birth/Sex: Treating RN: 1961/03/24 (58  y.o. [name] Primary Care Provider: PATIENT, NO Other Clinician: Referring Provider: Treating Provider/Extender:Edwena Mayorga, [name] in Treatment: 10 Diagnosis Coding ICD-10 Codes Code Description E11.621 Type 2 diabetes mellitus with foot ulcer E11.42 Type 2 diabetes mellitus with diabetic polyneuropathy Z89.511 Acquired absence of right leg below knee M86.672 Other chronic osteomyelitis, left ankle and foot Facility Procedures CPT4 Code: [id number] Description: 99213 - WOUND CARE VISIT-LEV 3 EST PT Modifier: Quantity: 1 Physician Procedures CPT4 Code: 9021115 Description: 52080 - WC PHYS LEVEL 3 - EST PT ICD-10 Diagnosis Description E11.621 Type 2 diabetes mellitus with foot ulcer M86.672 Other chronic osteomyelitis, left ankle and foot Modifier: Quantity: 1 Electronic Signature(s) Signed: 07/23/2019 7:47:18 AM By: [name] MD Entered By: [name] on 07/22/2019 14:28:16

## 2019-07-24 ENCOUNTER — Encounter: Payer: Self-pay | Admitting: Orthopedic Surgery

## 2019-07-24 NOTE — Progress Notes (Signed)
Office Visit Note   Patient: Cory Palmer           Date of Birth: July 08, 1961           MRN: [id number] Visit Date: 07/23/2019              Requested by: No referring provider defined for this encounter. PCP: Patient, [name] Per  Chief Complaint  Patient presents with  . Left Foot - Routine Post Op    09/28/2018 Left TMA      HPI: This is a pleasant gentleman who presents for a ulcer on his left forefoot.  He is status post left foot transmetatarsal amputation in February 2020.  He said he has an issue with an ongoing ulcer on the plantar surface of this forefoot.  He has been followed at the wound care center.  He has been referred back to Dr. [name] for surgical evaluation.  He did have a recent MRI which was concerning for osteomyelitis of the remnant of the shaft of the second metatarsal.  Assessment & Plan: Visit Diagnoses: No diagnosis found.  Plan: He is stable right now he does not have any fever or chills and this has been an ongoing problem.  I am not sure if there is a limb salvage option considering the location of the osteomyelitis.  I will have him return in visit with Dr. [name] to discuss possible surgical options he is also going to visit with a infectious disease doctor per recommendation of the wound care clinic  Follow-Up Instructions: No follow-ups on file.   Ortho Exam  Patient is alert, oriented, no adenopathy, well-dressed, normal affect, normal respiratory effort. He has a left forefoot plantar ulcer in the mid plantar forefoot there is no surrounding erythema there is some surrounding necrotic tissue this does probe deeply his pulse is palpable  Imaging: No results found. No images are attached to the encounter.  Labs: Lab Results  Component Value Date   HGBA1C 6.6 (H) 09/28/2018   HGBA1C 7.5 (H) 05/08/2018   ESRSEDRATE 33 (H) 10/28/2018   ESRSEDRATE 16 05/07/2018   CRP <0.8 10/28/2018   CRP <0.8 05/07/2018   REPTSTATUS 06/25/2019 FINAL  06/20/2019   GRAMSTAIN NO WBC SEEN ABUNDANT GRAM POSITIVE COCCI  06/20/2019   CULT  06/20/2019    FEW GROUP A STREP (S.PYOGENES) ISOLATED FEW GROUP B STREP(S.AGALACTIAE)ISOLATED TESTING AGAINST S. AGALACTIAE NOT ROUTINELY PERFORMED DUE TO PREDICTABILITY OF AMP/PEN/VAN SUSCEPTIBILITY. NO ANAEROBES ISOLATED Performed at [facility], [address], [place], [place] [address]    [facility] STAPHYLOCOCCUS AUREUS 10/04/2018   LABORGA STREPTOCOCCUS GROUP G 10/04/2018     Lab Results  Component Value Date   ALBUMIN 2.9 (L) 10/30/2018   ALBUMIN 3.4 (L) 10/29/2018   ALBUMIN 3.5 10/28/2018   PREALBUMIN 11.2 (L) 05/08/2018    Lab Results  Component Value Date   MG 1.6 (L) 10/18/2018   MG 1.5 (L) 10/17/2018   MG 1.8 10/10/2018   No results found for: VD25OH  Lab Results  Component Value Date   PREALBUMIN 11.2 (L) 05/08/2018   CBC EXTENDED Latest Ref Rng & Units 12/19/2018 10/30/2018 10/29/2018  WBC 4.0 - 10.5 K/uL 6.2 4.6 6.9  RBC 4.22 - 5.81 MIL/uL 4.20(L) 2.91(L) 3.22(L)  HGB 13.0 - 17.0 g/dL 09.6 2.8(Z) 10.4(L)  HCT 39.0 - 52.0 % 41.1 27.8(L) 30.3(L)  PLT 150 - 400 K/uL 95(L) 82(L) 105(L)  NEUTROABS 1.7 - 7.7 K/uL - - -  LYMPHSABS 0.7 -  4.0 K/uL - - -     Body mass index is 32.5 kg/m.  Orders:  No orders of the defined types were placed in this encounter.  No orders of the defined types were placed in this encounter.    Procedures: No procedures performed  Clinical Data: No additional findings.  ROS:  All other systems negative, except as noted in the HPI. Review of Systems  Objective: Vital Signs: Ht 6\' 3"  (1.905 m)   Wt 260 lb (117.9 kg)   BMI 32.50 kg/m   Specialty Comments:  No specialty comments available.  PMFS History: Patient Active Problem List   Diagnosis Date Noted  . Serum total bilirubin elevated 10/29/2018  . BPH (benign prostatic hyperplasia) 10/29/2018  . Non-pressure chronic ulcer of other part of left foot limited to breakdown  of skin ([place])   . Below-knee amputation of right lower extremity ([place])   . Hx of BKA, right ([place])   . PAF (paroxysmal atrial fibrillation) ([place])   . Bacteremia due to Gram-positive bacteria   . Hypokalemia   . Hypomagnesemia   . Sepsis without acute organ dysfunction ([place]) 10/05/2018  . Depressed bipolar disorder ([place]) 10/05/2018  . Hypertension 10/05/2018  . Hyperbilirubinemia 10/05/2018  . S/P transmetatarsal amputation of foot, left ([place]) 09/28/2018  . Wound infection 05/07/2018  . Homelessness 05/07/2018  . Diabetes mellitus type 2, noninsulin dependent ([place]) 05/07/2018  . Acute osteomyelitis involving lower leg, right ([place]) 05/07/2018   Past Medical History:  Diagnosis Date  . Anxiety   . Depressed bipolar disorder ([place])   . Diabetes mellitus without complication ([place])   . Hypertension   . Osteomyelitis of toe of left foot ([facility])     Family History  Problem Relation Age of Onset  . Hypertension Other   . Diabetes Mellitus II Other     Past Surgical History:  Procedure Laterality Date  . AMPUTATION Left 09/28/2018   Procedure: LEFT TRANSMETATARSAL AMPUTATION;  Surgeon: Newt Minion, MD;  Location: [facility];  Service: Orthopedics;  Laterality: Left;  . BELOW KNEE LEG AMPUTATION Right    Social History   Occupational History  . Not on file  Tobacco Use  . Smoking status: Current Every Day Smoker    Types: Cigarettes  . Smokeless tobacco: Never Used  Substance and Sexual Activity  . Alcohol use: Yes    Comment: occasional  . Drug use: Yes    Types: Cocaine  . Sexual activity: Not on file

## 2019-07-24 NOTE — Progress Notes (Signed)
Cory Palmer, Cory B. ([id number]) Visit Report for 07/22/2019 Arrival Information Details Patient Name: Date of Service: Cory Palmer, Cory B. 07/22/2019 1:15 PM Medical Record Number:[id number] Patient Account Number: [id number] Date of Birth/Sex: Treating RN: 07/30/1961 (58 y.o. M) Dwiggins, Shannon Primary Care : PATIENT, NO Other Clinician: Referring : Treating /Extender:Robson, Michael Weeks in Treatment: 10 Visit Information History Since Last Visit Cane Added or deleted any medications: No Patient Arrived: 13:00 Any new allergies or adverse reactions: No Arrival Time: Had a fall or experienced change in No Accompanied By: self None activities of daily living that may affect Transfer Assistance: risk of falls: Patient Identification Verified: Yes Signs or symptoms of abuse/neglect since last No Secondary Verification Process Completed: Yes visito Patient Requires Transmission-Based No Hospitalized since last visit: No Precautions: Implantable device outside of the clinic excluding No Patient Has Alerts: No cellular tissue based products placed in the center since last visit: Has Dressing in Place as Prescribed: Yes Pain Present Now: No Electronic Signature(s) Signed: 07/24/2019 4:54:21 PM By: Dwiggins, Shannon Entered By: Dwiggins, Shannon on 07/22/2019 13:01:42 -------------------------------------------------------------------------------- Clinic Level of Care Assessment Details Patient Name: Date of Service: Cory Palmer, Cory B. 07/22/2019 1:15 PM Medical Record Number:[id number] Patient Account Number: [id number] Date of Birth/Sex: Treating RN: 06/14/1961 (58 y.o. M) Boehlein, Linda Primary Care : PATIENT, NO Other Clinician: Referring : Treating /Extender:Robson, Michael Weeks in Treatment: 10 Clinic Level of Care Assessment Items TOOL 4 Quantity Score [] - Use when only an EandM is performed on FOLLOW-UP visit  0 ASSESSMENTS - Nursing Assessment / Reassessment X - Reassessment of Co-morbidities (includes updates in patient status) 1 10 X - Reassessment of Adherence to Treatment Plan 1 5 ASSESSMENTS - Wound and Skin Assessment / Reassessment X - Simple Wound Assessment / Reassessment - one wound 1 5 [] - Complex Wound Assessment / Reassessment - multiple wounds 0 [] - Dermatologic / Skin Assessment (not related to wound area) 0 ASSESSMENTS - Focused Assessment [] - Circumferential Edema Measurements - multi extremities 0 [] - Nutritional Assessment / Counseling / Intervention 0 X - Lower Extremity Assessment (monofilament, tuning fork, pulses) 1 5 [] - Peripheral Arterial Disease Assessment (using hand held doppler) 0 ASSESSMENTS - Ostomy and/or Continence Assessment and Care [] - Incontinence Assessment and Management 0 [] - Ostomy Care Assessment and Management (repouching, etc.) 0 PROCESS - Coordination of Care X - Simple Patient / Family Education for ongoing care 1 15 [] - Complex (extensive) Patient / Family Education for ongoing care 0 X - Staff obtains Consents, Records, Test Results / Process Orders 1 10 [] - Staff telephones HHA, Nursing Homes / Clarify orders / etc 0 [] - Routine Transfer to another Facility (non-emergent condition) 0 [] - Routine Hospital Admission (non-emergent condition) 0 [] - New Admissions / Insurance Authorizations / Ordering NPWT, Apligraf, etc. 0 [] - Emergency Hospital Admission (emergent condition) 0 X - Simple Discharge Coordination 1 10 [] - Complex (extensive) Discharge Coordination 0 PROCESS - Special Needs [] - Pediatric / Minor Patient Management 0 [] - Isolation Patient Management 0 [] - Hearing / Language / Visual special needs 0 [] - Assessment of Community assistance (transportation, D/C planning, etc.) 0 [] - Additional assistance / Altered mentation 0 [] - Support Surface(s) Assessment (bed, cushion, seat, etc.) 0 INTERVENTIONS - Wound  Cleansing / Measurement X - Simple Wound Cleansing - one wound 1 5 [] - Complex Wound Cleansing - multiple wounds 0 X - Wound Imaging (photographs - any number of wounds)   1 5 [] - Wound Tracing (instead of photographs) 0 X - Simple Wound Measurement - one wound 1 5 [] - Complex Wound Measurement - multiple wounds 0 INTERVENTIONS - Wound Dressings X - Small Wound Dressing one or multiple wounds 1 10 [] - Medium Wound Dressing one or multiple wounds 0 [] - Large Wound Dressing one or multiple wounds 0 X - Application of Medications - topical 1 5 [] - Application of Medications - injection 0 INTERVENTIONS - Miscellaneous [] - External ear exam 0 [] - Specimen Collection (cultures, biopsies, blood, body fluids, etc.) 0 [] - Specimen(s) / Culture(s) sent or taken to Lab for analysis 0 [] - Patient Transfer (multiple staff / Hoyer Lift / Similar devices) 0 [] - Simple Staple / Suture removal (25 or less) 0 [] - Complex Staple / Suture removal (26 or more) 0 [] - Hypo / Hyperglycemic Management (close monitor of Blood Glucose) 0 [] - Ankle / Brachial Index (ABI) - do not check if billed separately 0 X - Vital Signs 1 5 Has the patient been seen at the hospital within the last three years: Yes Total Score: 95 Level Of Care: New/Established - Level 3 Electronic Signature(s) Signed: 07/22/2019 5:05:42 PM By: Boehlein, Linda RN, BSN Entered By: Boehlein, Linda on 07/22/2019 13:45:04 -------------------------------------------------------------------------------- Encounter Discharge Information Details Patient Name: Date of Service: Cory Palmer, Cory B. 07/22/2019 1:15 PM Medical Record Number:[id number] Patient Account Number: [id number] Date of Birth/Sex: Treating RN: 04/23/1961 (58 y.o. M) Deaton, Bobbi Primary Care : PATIENT, NO Other Clinician: Referring : Treating /Extender:Robson, Michael Weeks in Treatment: 10 Encounter Discharge Information Items Discharge  Condition: Stable Ambulatory Status: Cane Discharge Destination: Home Transportation: Private Auto Accompanied By: self Schedule Follow-up Appointment: Yes Clinical Summary of Care: Electronic Signature(s) Signed: 07/22/2019 5:06:44 PM By: Deaton, Bobbi Entered By: Deaton, Bobbi on 07/22/2019 14:58:02 -------------------------------------------------------------------------------- Lower Extremity Assessment Details Patient Name: Date of Service: Cory Palmer, Cory B. 07/22/2019 1:15 PM Medical Record Number:[id number] Patient Account Number: [id number] Date of Birth/Sex: Treating RN: 03/22/1961 (58 y.o. M) Dwiggins, Shannon Primary Care : PATIENT, NO Other Clinician: Referring : Treating /Extender:Robson, Michael Weeks in Treatment: 10 Edema Assessment Assessed: [Left: No] [Right: No] Edema: [Left: Ye] [Right: s] Calf Left: Right: Point of Measurement: 43 cm From Medial Instep 41 cm cm Ankle Left: Right: Point of Measurement: 10 cm From Medial Instep 25.5 cm cm Vascular Assessment Pulses: Dorsalis Pedis Palpable: [Left:Yes] Electronic Signature(s) Signed: 07/24/2019 4:54:21 PM By: Dwiggins, Shannon Entered By: Dwiggins, Shannon on 07/22/2019 13:05:49 -------------------------------------------------------------------------------- Multi Wound Chart Details Patient Name: Date of Service: Cory Palmer, Cory B. 07/22/2019 1:15 PM Medical Record Number:[id number] Patient Account Number: [id number] Date of Birth/Sex: Treating RN: 01/21/1961 (58 y.o. M) Primary Care : PATIENT, NO Other Clinician: Referring : Treating /Extender:Robson, Michael Weeks in Treatment: 10 Vital Signs Height(in): 76 Capillary Blood 120 Glucose(mg/dl): Weight(lbs): 240 Pulse(bpm): 67 Body Mass Index(BMI): 29 Blood Pressure(mmHg): 114/41 Temperature(°F): 98.2 Respiratory 19 Rate(breaths/min): Photos: [1:No Photos] [N/A:N/A] Wound Location: [1:Left Foot -  Plantar] [N/A:N/A] Wounding Event: [1:Gradually Appeared] [N/A:N/A] Primary Etiology: [1:Diabetic Wound/Ulcer of the N/A Lower Extremity] Comorbid History: [1:Hypertension, Type II Diabetes, Osteomyelitis] [N/A:N/A] Date Acquired: [1:05/04/2018] [N/A:N/A] Weeks of Treatment: [1:10] [N/A:N/A] Wound Status: [1:Open] [N/A:N/A] Measurements L x W x D 3.8x2x0.2 [N/A:N/A] (cm) Area (cm) : [1:5.969] [N/A:N/A] Volume (cm) : [1:1.194] [N/A:N/A] % Reduction in Area: [1:50.90%] [N/A:N/A] % Reduction in Volume: 50.90% [N/A:N/A] Classification: [1:Grade 3] [N/A:N/A] Exudate Amount: [1:Medium] [N/A:N/A] Exudate Type: [1:Purulent] [N/A:N/A] Exudate Color: [1:yellow, brown, green] [N/A:N/A] Wound   Margin: [1:Thickened] [N/A:N/A] Granulation Amount: [1:Large (67-100%)] [N/A:N/A] Granulation Quality: [1:Pink] [N/A:N/A] Necrotic Amount: [1:None Present (0%)] [N/A:N/A] Exposed Structures: [1:Fat Layer (Subcutaneous N/A Tissue) Exposed: Yes Fascia: No Tendon: No Muscle: No Joint: No Bone: No Small (1-33%)] [N/A:N/A] Treatment Notes Electronic Signature(s) Signed: 07/23/2019 7:47:18 AM By: Robson, Michael MD Entered By: Robson, Michael on 07/22/2019 14:22:40 -------------------------------------------------------------------------------- Multi-Disciplinary Care Plan Details Patient Name: Date of Service: Cory Palmer, Cory B. 07/22/2019 1:15 PM Medical Record Number:[id number] Patient Account Number: [id number] Date of Birth/Sex: Treating RN: 01/26/1961 (58 y.o. M) Boehlein, Linda Primary Care : PATIENT, NO Other Clinician: Referring : Treating /Extender:Robson, Michael Weeks in Treatment: 10 Active Inactive Nutrition Nursing Diagnoses: Impaired glucose control: actual or potential Potential for alteratiion in Nutrition/Potential for imbalanced nutrition Goals: Patient/caregiver agrees to and verbalizes understanding of need to obtain nutritional consultation Date  Inactivated: 07/08/2019 Target Resolution Date Initiated: 05/09/2019 Date: 06/28/2019 Goal Status: Met Patient/caregiver will maintain therapeutic glucose control Date Initiated: 05/09/2019 Target Resolution Date: 08/09/2019 Goal Status: Active Interventions: Assess HgA1c results as ordered upon admission and as needed Provide education on elevated blood sugars and impact on wound healing Provide education on nutrition Treatment Activities: Education provided on Nutrition : 06/20/2019 Patient referred to Primary Care Physician for further nutritional evaluation : 05/09/2019 Notes: Wound/Skin Impairment Nursing Diagnoses: Knowledge deficit related to ulceration/compromised skin integrity Goals: Patient/caregiver will verbalize understanding of skin care regimen Date Initiated: 05/09/2019 Target Resolution Date: 08/09/2019 Goal Status: Active Interventions: Assess patient/caregiver ability to perform ulcer/skin care regimen upon admission and as needed Assess ulceration(s) every visit Provide education on ulcer and skin care Treatment Activities: Skin care regimen initiated : 05/09/2019 Topical wound management initiated : 05/09/2019 Notes: Electronic Signature(s) Signed: 07/22/2019 5:05:42 PM By: Boehlein, Linda RN, BSN Entered By: Boehlein, Linda on 07/22/2019 13:04:29 -------------------------------------------------------------------------------- Pain Assessment Details Patient Name: Date of Service: Cory Palmer, Cory B. 07/22/2019 1:15 PM Medical Record Number:[id number] Patient Account Number: [id number] Date of Birth/Sex: Treating RN: 11/14/1960 (58 y.o. M) Dwiggins, Shannon Primary Care : PATIENT, NO Other Clinician: Referring : Treating /Extender:Robson, Michael Weeks in Treatment: 10 Active Problems Location of Pain Severity and Description of Pain Patient Has Paino No Site Locations Pain Management and Medication Current Pain Management: Electronic  Signature(s) Signed: 07/24/2019 4:54:21 PM By: Dwiggins, Shannon Entered By: Dwiggins, Shannon on 07/22/2019 13:02:37 -------------------------------------------------------------------------------- Patient/Caregiver Education Details Patient Name: Date of Service: Cory Palmer, Cory B. 12/21/2020andnbsp1:15 PM Medical Record Patient Account Number: [id number] Number: Treating RN: Boehlein, Linda Date of Birth/Gender: 11/06/1960 (58 y.o. Other Clinician: M) Treating Robson, Michael Primary Care Physician: PATIENT, NO Physician/Extender: Referring Physician: Weeks in Treatment: 10 Education Assessment Education Provided To: Patient Education Topics Provided Elevated Blood Sugar/ Impact on Healing: Methods: Explain/Verbal Responses: Reinforcements needed, State content correctly Wound/Skin Impairment: Methods: Explain/Verbal Responses: Reinforcements needed, State content correctly Electronic Signature(s) Signed: 07/22/2019 5:05:42 PM By: Boehlein, Linda RN, BSN Entered By: Boehlein, Linda on 07/22/2019 13:04:51 -------------------------------------------------------------------------------- Wound Assessment Details Patient Name: Date of Service: Cory Palmer, Cory B. 07/22/2019 1:15 PM Medical Record Number:[id number] Patient Account Number: [id number] Date of Birth/Sex: Treating RN: 10/19/1960 (58 y.o. M) Dwiggins, Shannon Primary Care : PATIENT, NO Other Clinician: Referring : Treating /Extender:Robson, Michael Weeks in Treatment: 10 Wound Status Wound Number: 1 Primary Diabetic Wound/Ulcer of the Lower Etiology: Extremity Wound Location: Left Foot - Plantar Wound Status: Open Wounding Event: Gradually Appeared Comorbid Hypertension, Type II Diabetes, Date Acquired: 05/04/2018 History: Osteomyelitis Weeks Of Treatment: 10 Clustered Wound: No Wound Measurements Length: (cm) 3.8 Width: (cm) 2 Depth: (cm) 0.2 Area: (  cm) 5.969 Volume: (cm)  1.194 Wound Description Classification: Grade 3 Wound Margin: Thickened Exudate Amount: Medium Exudate Type: Purulent Exudate Color: yellow, brown, green Wound Bed Granulation Amount: Large (67-100%) Granulation Quality: Pink Necrotic Amount: None Present (0%) fter Cleansing: No ino No Exposed Structure sed: No Subcutaneous Tissue) Exposed: Yes sed: No sed: No ed: No d: No % Reduction in Area: 50.9% % Reduction in Volume: 50.9% Epithelialization: Small (1-33%) Tunneling: No Undermining: No Foul Odor A Slough/Fibr Fascia Expo Fat Layer ( Tendon Expo Muscle Expo Joint Expos Bone Expose Treatment Notes Wound #1 (Left, Plantar Foot) 1. Cleanse With Wound Cleanser 3. Primary Dressing Applied Calcium Alginate Ag 4. Secondary Dressing Dry Gauze Roll Gauze Foam 5. Secured With Medipore tape Notes netting. Electronic Signature(s) Signed: 07/24/2019 4:54:21 PM By: Kela Millin Entered By: Kela Millin on 07/22/2019 13:11:11 -------------------------------------------------------------------------------- Vitals Details Patient Name: Date of Service: Cory Palmer, Cory Palmer 07/22/2019 1:15 PM Medical Record [id number] Patient Account Number: [id number] Date of Birth/Sex: Treating RN: 02-02-1961 (58 y.o. Marvis Repress Primary Care Aadyn Buchheit: PATIENT, NO Other Clinician: Referring Destane Speas: Treating Darion Milewski/Extender:Robson, [name] in Treatment: 10 Vital Signs Time Taken: 13:00 Temperature (F): 98.2 Height (in): 76 Pulse (bpm): 67 Weight (lbs): 240 Respiratory Rate (breaths/min): 19 Body Mass Index (BMI): 29.2 Blood Pressure (mmHg): 114/41 Capillary Blood Glucose (mg/dl): 120 Reference Range: 80 - 120 mg / dl Notes patient stated last CBG was 120 Electronic Signature(s) Signed: 07/24/2019 4:54:21 PM By: Kela Millin Entered By: Kela Millin on 07/22/2019 13:02:23

## 2019-07-29 ENCOUNTER — Encounter (HOSPITAL_BASED_OUTPATIENT_CLINIC_OR_DEPARTMENT_OTHER): Payer: Medicare Other | Admitting: Internal Medicine

## 2019-07-29 ENCOUNTER — Other Ambulatory Visit: Payer: Self-pay

## 2019-07-29 DIAGNOSIS — E11621 Type 2 diabetes mellitus with foot ulcer: Secondary | ICD-10-CM | POA: Diagnosis not present

## 2019-07-29 NOTE — Progress Notes (Signed)
Cory Palmer ([id number]) Visit Report for 07/29/2019 Arrival Information Details Patient Name: Date of Service: Cory Palmer, Cory Palmer 07/29/2019 8:30 AM Medical Record [id number] Patient Account Number: [id number] Date of Birth/Sex: Treating RN: 06-02-1961 (58 y.o. Cory Palmer Primary Care Torey Reinard: PATIENT, NO Other Clinician: Referring Elhadji Pecore: Treating Demarko Zeimet/Extender:Robson, [name] in Treatment: 11 Visit Information History Since Last Visit Cane Added or deleted any medications: No Patient Arrived: 08:50 Any new allergies or adverse reactions: No Arrival Time: Had a fall or experienced change in No Accompanied By: self None activities of daily living that may affect Transfer Assistance: risk of falls: Patient Identification Verified: Yes Signs or symptoms of abuse/neglect since last No Secondary Verification Process Completed: Yes visito Patient Requires Transmission-Based No Hospitalized since last visit: No Precautions: Implantable device outside of the clinic excluding No Patient Has Alerts: No cellular tissue based products placed in the center since last visit: Has Dressing in Place as Prescribed: Yes Pain Present Now: No Electronic Signature(s) Signed: 07/29/2019 5:46:04 PM By: Kela Millin Entered By: Kela Millin on 07/29/2019 08:50:27 -------------------------------------------------------------------------------- Encounter Discharge Information Details Patient Name: Date of Service: Cory Palmer 07/29/2019 8:30 AM Medical Record [id number] Patient Account Number: [id number] Date of Birth/Sex: Treating RN: 1960/11/19 (58 y.o. Cory Palmer Primary Care Sharmel Ballantine: PATIENT, NO Other Clinician: Referring Kalimah Capurro: Treating Zayah Keilman/Extender:Robson, [name] in Treatment: 11 Encounter Discharge Information Items Post Procedure Vitals Discharge Condition: Stable Temperature (F): 98.3 Ambulatory Status:  Cane Pulse (bpm): 68 Discharge Destination: Home Respiratory Rate (breaths/min): 18 Transportation: Private Auto Blood Pressure (mmHg): 144/63 Accompanied By: self Schedule Follow-up Appointment: Yes Clinical Summary of Care: Electronic Signature(s) Signed: 07/29/2019 5:44:52 PM By: Deon Pilling Entered By: Deon Pilling on 07/29/2019 09:34:06 -------------------------------------------------------------------------------- Lower Extremity Assessment Details Patient Name: Date of Service: Cory Palmer 07/29/2019 8:30 AM Medical Record [id number] Patient Account Number: [id number] Date of Birth/Sex: Treating RN: [birth year] (58 y.o. Cory Palmer Primary Care Adaria Hole: PATIENT, NO Other Clinician: Referring Galan Ghee: Treating Kimoni Pagliarulo/Extender:Robson, [name] in Treatment: 11 Edema Assessment Assessed: [Left: No] [Right: No] Edema: [Left: Ye] [Right: s] Calf Left: Right: Point of Measurement: 43 cm From Medial Instep 41.5 cm cm Ankle Left: Right: Point of Measurement: 10 cm From Medial Instep 25.5 cm cm Vascular Assessment Pulses: Dorsalis Pedis Palpable: [Left:Yes] Electronic Signature(s) Signed: 07/29/2019 5:46:04 PM By: Kela Millin Entered By: Kela Millin on 07/29/2019 08:55:31 -------------------------------------------------------------------------------- Multi Wound Chart Details Patient Name: Date of Service: Cory Palmer 07/29/2019 8:30 AM Medical Record [id number] Patient Account Number: [id number] Date of Birth/Sex: Treating RN: Dec 05, 1960 (58 y.o. M) Primary Care Tejasvi Brissett: PATIENT, NO Other Clinician: Referring Khaidyn Staebell: Treating Aletha Allebach/Extender:Robson, [name] in Treatment: 11 Vital Signs Height(in): 76 Capillary Blood 120 Glucose(mg/dl): Weight(lbs): 240 Pulse(bpm): 15 Body Mass Index(BMI): 29 Blood Pressure(mmHg): 144/63 Temperature(F): 98.3 Respiratory 18 Rate(breaths/min): Photos: [1:No  Photos] [N/A:N/A] Wound Location: [1:Left Foot - Plantar] [N/A:N/A] Wounding Event: [1:Gradually Appeared] [N/A:N/A] Primary Etiology: [1:Diabetic Wound/Ulcer of the N/A Lower Extremity] Comorbid History: [1:Hypertension, Type II Diabetes, Osteomyelitis] [N/A:N/A] Date Acquired: [1:05/04/2018] [N/A:N/A] Weeks of Treatment: [1:11] [N/A:N/A] Wound Status: [1:Open] [N/A:N/A] Measurements L x W x D 3.5x1.8x0.3 [N/A:N/A] (cm) Area (cm) : [1:4.948] [N/A:N/A] Volume (cm) : [1:1.484] [N/A:N/A] % Reduction in Area: [1:59.30%] [N/A:N/A] % Reduction in Volume: 39.00% [N/A:N/A] Classification: [1:Grade 3] [N/A:N/A] Exudate Amount: [1:Medium] [N/A:N/A] Exudate Type: [1:Purulent] [N/A:N/A] Exudate Color: [1:yellow, brown, green] [N/A:N/A] Wound Margin: [1:Thickened] [N/A:N/A] Granulation Amount: [1:Large (67-100%)] [N/A:N/A] Granulation Quality: [1:Pink] [N/A:N/A] Necrotic Amount: [1:None Present (0%)] [N/A:N/A] Exposed Structures: [1:Fat Layer (Subcutaneous N/A  Tissue) Exposed: Yes Fascia: No Tendon: No Muscle: No Joint: No Bone: No] Epithelialization: [1:None] [N/A:N/A] Debridement: [1:Debridement - Excisional N/A] Pre-procedure [1:09:15] [N/A:N/A] Verification/Time Out Taken: Pain Control: [1:Other] [N/A:N/A] Tissue Debrided: [1:Callus, Subcutaneous] [N/A:N/A] Level: [1:Skin/Subcutaneous Tissue N/A] Debridement Area (sq cm):6.3 [N/A:N/A] Instrument: [1:Curette] [N/A:N/A] Bleeding: [1:Minimum] [N/A:N/A] Hemostasis Achieved: [1:Pressure] [N/A:N/A] Procedural Pain: [1:0] [N/A:N/A] Post Procedural Pain: [1:0] [N/A:N/A] Debridement Treatment Procedure was tolerated [N/A:N/A] Response: [1:well] Post Debridement [1:3.5x1.8x0.3] [N/A:N/A] Measurements L x W x D (cm) Post Debridement [1:1.484] [N/A:N/A] Volume: (cm) Procedures Performed: [1:Debridement] [N/A:N/A] Treatment Notes Electronic Signature(s) Signed: 07/29/2019 6:10:19 PM By: [name] MD Entered By: [name] on  07/29/2019 09:27:11 -------------------------------------------------------------------------------- Multi-Disciplinary Care Plan Details Patient Name: Date of Service: Cory Palmer. 07/29/2019 8:30 AM Medical Record [id number] Patient Account Number: [id number] Date of Birth/Sex: Treating RN: Nov 24, 1960 (58 y.o. [name] Primary Care Eiden Bagot: PATIENT, NO Other Clinician: Referring Sweetie Giebler: Treating Taiki Buckwalter/Extender:Robson, [name] in Treatment: 11 Active Inactive Nutrition Nursing Diagnoses: Impaired glucose control: actual or potential Potential for alteratiion in Nutrition/Potential for imbalanced nutrition Goals: Patient/caregiver agrees to and verbalizes understanding of need to obtain nutritional consultation Date Inactivated: 07/08/2019 Target11/27/2020 Resolution Date Initiated: 05/09/2019 Date: Goal Status: Met Patient/caregiver will maintain therapeutic glucose control Date Initiated: 05/09/2019 Target Resolution Date: 08/09/2019 Goal Status: Active Interventions: Assess HgA1c results as ordered upon admission and as needed Provide education on elevated blood sugars and impact on wound healing Provide education on nutrition Treatment Activities: Education provided on Nutrition : 07/08/2019 Patient referred to Primary Care Physician for further nutritional evaluation : 05/09/2019 Notes: Wound/Skin Impairment Nursing Diagnoses: Knowledge deficit related to ulceration/compromised skin integrity Goals: Patient/caregiver will verbalize understanding of skin care regimen Date Initiated: 05/09/2019 Target Resolution Date: 08/09/2019 Goal Status: Active Interventions: Assess patient/caregiver ability to perform ulcer/skin care regimen upon admission and as needed Assess ulceration(s) every visit Provide education on ulcer and skin care Treatment Activities: Skin care regimen initiated : 05/09/2019 Topical wound management initiated :  05/09/2019 Notes: Electronic Signature(s) Signed: 07/29/2019 6:10:06 PM By: Levan Hurst RN, BSN Entered By: Levan Hurst on 07/29/2019 09:15:13 -------------------------------------------------------------------------------- Pain Assessment Details Patient Name: Date of Service: Cory Palmer 07/29/2019 8:30 AM Medical Record [id number] Patient Account Number: [id number] Date of Birth/Sex: Treating RN: November 03, 1960 (58 y.o. Cory Palmer Primary Care Lequita Meadowcroft: PATIENT, NO Other Clinician: Referring Roddie Riegler: Treating Ayva Veilleux/Extender:Robson, [name] in Treatment: 11 Active Problems Location of Pain Severity and Description of Pain Patient Has Paino No Site Locations Pain Management and Medication Current Pain Management: Electronic Signature(s) Signed: 07/29/2019 5:46:04 PM By: Kela Millin Entered By: Kela Millin on 07/29/2019 08:51:44 -------------------------------------------------------------------------------- Patient/Caregiver Education Details Patient Name: Cory Palmer 12/28/2020andnbsp8:30 Date of Service: AM Medical Record [id number] Number: Patient Account Number: [id number] Treating RN: 1961-02-22 (58 y.o. Levan Hurst Date of Birth/Gender: M) Other Clinician: Primary Care Physician: PATIENT, NO Treating [name] Referring Physician: Physician/Extender: Suella Grove in Treatment: 11 Education Assessment Education Provided To: Patient Education Topics Provided Infection: Methods: Explain/Verbal Responses: State content correctly Wound/Skin Impairment: Methods: Explain/Verbal Responses: State content correctly Electronic Signature(s) Signed: 07/29/2019 6:10:06 PM By: Levan Hurst RN, BSN Entered By: Levan Hurst on 07/29/2019 09:15:27 -------------------------------------------------------------------------------- Wound Assessment Details Patient Name: Date of Service: Cory Palmer 07/29/2019 8:30  AM Medical Record [id number] Patient Account Number: [id number] Date of Birth/Sex: Treating RN: 1961/05/30 (59 y.o. Cory Palmer Primary Care Octavis Sheeler: PATIENT, NO Other Clinician: Referring Enda Santo: Treating Celia Gibbons/Extender:Robson, [name] in Treatment: 11 Wound Status Wound Number: 1 Primary Diabetic Wound/Ulcer of the Lower  Etiology: Extremity Wound Location: Left Foot - Plantar Wound Status: Open Wounding Event: Gradually Appeared Comorbid Hypertension, Type II Diabetes, Date Acquired: 05/04/2018 History: Osteomyelitis Weeks Of Treatment: 11 Clustered Wound: No Wound Measurements Length: (cm) 3.5 Width: (cm) 1.8 Depth: (cm) 0.3 Area: (cm) 4.948 Volume: (cm) 1.484 Wound Description Classification: Grade 3 Wound Margin: Thickened Exudate Amount: Medium Exudate Type: Purulent Exudate Color: yellow, brown, green Wound Bed Granulation Amount: Large (67-100%) Granulation Quality: Pink Necrotic Amount: None Present (0%) fter Cleansing: No ino No Exposed Structure sed: No Subcutaneous Tissue) Exposed: Yes sed: No sed: No ed: No d: No % Reduction in Area: 59.3% % Reduction in Volume: 39% Epithelialization: None Tunneling: No Undermining: No Foul Odor A Slough/Fibr Fascia Expo Fat Layer ( Tendon Expo Muscle Expo Joint Expos Bone Expose Electronic Signature(s) Signed: 07/29/2019 5:46:04 PM By: Kela Millin Entered By: Kela Millin on 07/29/2019 08:57:35 -------------------------------------------------------------------------------- Vitals Details Patient Name: Date of Service: Cory Palmer 07/29/2019 8:30 AM Medical Record [id number] Patient Account Number: [id number] Date of Birth/Sex: Treating RN: 05/01/1961 (58 y.o. Cory Palmer Primary Care Jahmire Ruffins: PATIENT, NO Other Clinician: Referring Kyston Gonce: Treating Amaiya Scruton/Extender:Robson, [name] in Treatment: 11 Vital Signs Time Taken:  08:50 Temperature (F): 98.3 Height (in): 76 Pulse (bpm): 68 Weight (lbs): 240 Respiratory Rate (breaths/min): 18 Body Mass Index (BMI): 29.2 Blood Pressure (mmHg): 144/63 Capillary Blood Glucose (mg/dl): 120 Reference Range: 80 - 120 mg / dl Notes patient stated last CBG was 120 Electronic Signature(s) Signed: 07/29/2019 5:46:04 PM By: Kela Millin Entered By: Kela Millin on 07/29/2019 08:51:38

## 2019-07-29 NOTE — Progress Notes (Signed)
Cory Palmer ([id number]) Visit Report for 07/29/2019 Debridement Details Patient Name: Date of Service: Cory Palmer, Cory Palmer 07/29/2019 8:30 AM Medical Record [id number] Patient Account Number: [id number] Date of Birth/Sex: [birth year] (58 y.o. M) Treating RN: Primary Care Provider: PATIENT, NO Other Clinician: Referring Provider: Treating Provider/Extender:[name], [name] in Treatment: 11 Debridement Performed for Wound #1 Left,Plantar Foot Assessment: Performed By: Physician [name]., MD Debridement Type: Debridement Severity of Tissue Pre Fat layer exposed Debridement: Level of Consciousness (Pre- Awake and Alert procedure): Pre-procedure Verification/Time Out Taken: Yes - 09:15 Start Time: 09:15 Pain Control: Other : Benzocaine 20% Total Area Debrided (L x W): 3.5 (cm) x 1.8 (cm) = 6.3 (cm) Tissue and other material Viable, Non-Viable, Callus, Subcutaneous, Skin: Epidermis debrided: Level: Skin/Subcutaneous Tissue Debridement Description: Excisional Instrument: Curette Bleeding: Minimum Hemostasis Achieved: Pressure End Time: 09:18 Procedural Pain: 0 Post Procedural Pain: 0 Response to Treatment: Procedure was tolerated well Level of Consciousness Awake and Alert (Post-procedure): Post Debridement Measurements of Total Wound Length: (cm) 3.5 Width: (cm) 1.8 Depth: (cm) 0.3 Volume: (cm) 1.484 Character of Wound/Ulcer Post Improved Debridement: Severity of Tissue Post Debridement: Fat layer exposed Post Procedure Diagnosis Same as Pre-procedure Electronic Signature(s) Signed: 07/29/2019 6:10:19 PM By: [name] MD Entered By: [name] on 07/29/2019 09:27:20 -------------------------------------------------------------------------------- HPI Details Patient Name: Date of Service: Cory Palmer 07/29/2019 8:30 AM Medical Record [id number] Patient Account Number: [id number] Date of Birth/Sex: Treating RN: 1961/06/18  (58 y.o. M) Primary Care Provider: PATIENT, NO Other Clinician: Referring Provider: Treating Provider/Extender:[name], [name] in Treatment: 11 History of Present Illness HPI Description: ADMISSION 05/09/2019 This is a 58 year old man with type 2 diabetes. He has had a previous right BKA done in [place] 4 years ago. Looking back through [place] link this year he had a left fourth toe wound in January 2020. By looking at the pictures he had also had previous amputation of the first second and third toes as well as a right BKA. 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 Sharol Given on 09/28/2018. I believe at some point he dehisced the amputation as he had a wound VAC in early March. He was admitted to hospital from 10/04/2018 through 10/18/2018 with MSSA/strep sepsis. A TEE was negative. I do not believe during this admission he was felt to have osteomyelitis however an x-ray on 10/28/2018 showed concern for osteomyelitis at the remaining second third and fourth metatarsals. An x-ray on 12/19/2018 showed progressive new bone formation at the transmetatarsal amputation sites of the second through fifth digits consistent with either acute or chronic osteomyelitis. It is hard to tell although after the discharge in March it looks as though he had a course of vancomycin and may be oral antibiotics although I am not sure about this or the duration of antibiotic therapy. The patient states that the wound opened up several months ago. It was clear that there was a wound on the plantar left foot on 12/27/2018. He was in the ER on 9/20 with a large wound on the left plantar foot given Silvadene. The patient states he has been putting bizarre combinations of things on this wound including castor oil. Past medical history; congestive heart failure, type 2 diabetes, bipolar disorder, right BKA, remote history of substance abuse, atrial fibrillation Arterial studies done on the  left and 05/06/2018 showed an ABI of 1.21 with triphasic waveforms. 10/15; I did talk with Dr. Darron Doom and asked her to order a CRP and sedimentation rate. Based on  the lab work that she is doing inflammatory markers white count etc. we will order an MRI of the left foot [see discussion above]. She actually stated that she has not really been following him regularly so was not really aware of what had been done in March. His wound actually looks somewhat better today and slightly smaller in terms of surface area 10/22; surprisingly the patient's labs that I ordered with Dr. Dennie Fetters assistance were not that bad. White count was 4.9 differential count normal. Sedimentation rate was 23 comprehensive metabolic panel was normal. Hemoglobin A1c 5.9 I thought I saw the C-reactive protein but I cannot put my finger on this right at the moment. His wound actually looks somewhat better measures better. He did however have some fluorescent green drainage 11/5; the patient's wound is come in a centimeter in width. We have been using silver alginate he has been changing the dressing himself.. We do not have a way to aggressively offload this as he will has a right leg prosthesis. Nursing still reports green drainage. I gave him antibiotics for this 11/19; the patient's wound came in again this time however he is complaining of pain in the inferior part of the wound on the left plantar foot. This is where he is undermining area is I have obtained a culture for CandS and I am going to give him empiric antibiotics. He has a complicated wound history including a right BKA and a left transmetatarsal amputation. The wound is roughly on the third met head remanent 12/7; patient has not been here in 3 weeks. I think this is a transportation issue. I gave him doxycycline last time is here. The culture showed strep and he was changed to Augmentin in my absence. He he is completing this. X-ray however raised the  suspicion of osteomyelitis and he has an MRI booked for Wednesday i.e. in 2 days. We will use calcium alginate until then 12/14; patient comes in today with purulent drainage noted by her intake nurses. He is completing Augmentin that was given for him last time. His MRI was done on 12/9; this showed osteomyelitis of the remanent of the shaft of the second metatarsal. We have been using silver alginate 12/21; wound is on the plantar aspect of his foot. I do not see any evidence of current infection. His MRI done early this month showed osteomyelitis of the remanent of the shaft of the second metatarsal. He sees Dr. Sharol Given tomorrow. He has an infectious disease appointment on January 4. I am hopeful that we can make a decision about how to approach this. Hopefully with some foot preservation 12/28; wound is on the plantar aspect of his left foot. Previous right BKA. He did not see Dr. Sharol Given last week but has an appointment to see him next week. He did see the PA who noted that he does have an MRI that suggested osteomyelitis of the remanent of the shaft of the second metatarsal. I am interested in knowing whether Dr. Sharol Given feels a surgical approach is possible short of a BKA. He also has an appointment with infectious disease. I have told the patient that he may have a difficult decision to make. Were not making a lot of progress. This is a usual problem when you have a wound on the plantar with a BKA on the other side. There is really no way to offload this it is easy. Electronic Signature(s) Signed: 07/29/2019 6:10:19 PM By: [name] MD Entered By: [name] on  07/29/2019 09:28:54 -------------------------------------------------------------------------------- Physical Exam Details Patient Name: Date of Service: Cory Palmer, Cory Palmer 07/29/2019 8:30 AM Medical Record [id number] Patient Account Number: [id number] Date of Birth/Sex: Treating RN: 02-28-1961 (58 y.o. M) Primary Care  Provider: PATIENT, NO Other Clinician: Referring Provider: Treating Provider/Extender:, [name] in Treatment: 11 Notes Wound exam; not much major change. Using a #5 curette I debrided the rolled senescent edges of callus skin and subcutaneous tissue from the wound margin. The surface of the wound is actually not too bad. There is brisk bleeding. His peripheral pulses are palpable. No obvious surface infection Electronic Signature(s) Signed: 07/29/2019 6:10:19 PM By: [name] MD Entered By: [name] on 07/29/2019 09:29:32 -------------------------------------------------------------------------------- Physician Orders Details Patient Name: Date of Service: Cory Palmer, Cory Palmer 07/29/2019 8:30 AM Medical Record [id number] Patient Account Number: [id number] Date of Birth/Sex: Treating RN: 07-01-1961 (58 y.o. [name] Primary Care Provider: PATIENT, NO Other Clinician: Referring Provider: Treating Provider/Extender:, [name] in Treatment: 11 Verbal / Phone Orders: No Diagnosis Coding ICD-10 Coding Code Description E11.621 Type 2 diabetes mellitus with foot ulcer E11.42 Type 2 diabetes mellitus with diabetic polyneuropathy Z89.511 Acquired absence of right leg below knee M86.672 Other chronic osteomyelitis, left ankle and foot Follow-up Appointments Return Appointment in 2 weeks. Dressing Change Frequency Wound #1 Left,Plantar Foot Change Dressing every other day. Skin Barriers/Peri-Wound Care Moisturizing lotion - to left leg daily. Wound Cleansing Wound #1 Left,Plantar Foot May shower and wash wound with soap and water. - with dressing changes. Primary Wound Dressing Wound #1 Left,Plantar Foot Calcium Alginate with Silver Secondary Dressing Wound #1 Left,Plantar Foot Kerlix/Rolled Gauze ABD pad Other: - foam donut Edema Control Avoid standing for long periods of time Elevate legs to the level of the heart or above for 30  minutes daily and/or when sitting, a frequency of: - throughout the day. Additional Orders / Instructions Stop/Decrease Smoking Follow Nutritious Diet - increase protein and vegetables in diabetic diet. Electronic Signature(s) Signed: 07/29/2019 6:10:06 PM By: Levan Hurst RN, BSN Signed: 07/29/2019 6:10:19 PM By: [name] MD Entered By: Levan Hurst on 07/29/2019 09:22:56 -------------------------------------------------------------------------------- Problem List Details Patient Name: Date of Service: Cory Palmer, Cory Palmer 07/29/2019 8:30 AM Medical Record [id number] Patient Account Number: [id number] Date of Birth/Sex: Treating RN: 08/05/1960 (58 y.o. [name] Primary Care Provider: PATIENT, NO Other Clinician: Referring Provider: Treating Provider/Extender:, [name] in Treatment: 11 Active Problems ICD-10 Evaluated Encounter Code Description Active Date Today Diagnosis E11.621 Type 2 diabetes mellitus with foot ulcer 05/09/2019 No Yes E11.42 Type 2 diabetes mellitus with diabetic polyneuropathy 05/09/2019 No Yes Z89.511 Acquired absence of right leg below knee 05/09/2019 No Yes M86.672 Other chronic osteomyelitis, left ankle and foot 05/09/2019 No Yes Inactive Problems Resolved Problems Electronic Signature(s) Signed: 07/29/2019 6:10:19 PM By: [name] MD Entered By: [name] on 07/29/2019 09:27:05 -------------------------------------------------------------------------------- Progress Note Details Patient Name: Date of Service: Cory Palmer 07/29/2019 8:30 AM Medical Record [id number] Patient Account Number: [id number] Date of Birth/Sex: Treating RN: 1961/06/03 (58 y.o. M) Primary Care Provider: PATIENT, NO Other Clinician: Referring Provider: Treating Provider/Extender:, [name] in Treatment: 11 Subjective History of Present Illness (HPI) ADMISSION 05/09/2019 This is a 58 year old man with type 2  diabetes. He has had a previous right BKA done in [place] 4 years ago. Looking back through [place] link this year he had a left fourth toe wound in January 2020. By looking at the pictures he had also had previous amputation of the first second and third toes as well  as a right BKA. 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 Sharol Given on 09/28/2018. I believe at some point he dehisced the amputation as he had a wound VAC in early March. He was admitted to hospital from 10/04/2018 through 10/18/2018 with MSSA/strep sepsis. A TEE was negative. I do not believe during this admission he was felt to have osteomyelitis however an x-ray on 10/28/2018 showed concern for osteomyelitis at the remaining second third and fourth metatarsals. An x-ray on 12/19/2018 showed progressive new bone formation at the transmetatarsal amputation sites of the second through fifth digits consistent with either acute or chronic osteomyelitis. It is hard to tell although after the discharge in March it looks as though he had a course of vancomycin and may be oral antibiotics although I am not sure about this or the duration of antibiotic therapy. The patient states that the wound opened up several months ago. It was clear that there was a wound on the plantar left foot on 12/27/2018. He was in the ER on 9/20 with a large wound on the left plantar foot given Silvadene. The patient states he has been putting bizarre combinations of things on this wound including castor oil. Past medical history; congestive heart failure, type 2 diabetes, bipolar disorder, right BKA, remote history of substance abuse, atrial fibrillation Arterial studies done on the left and 05/06/2018 showed an ABI of 1.21 with triphasic waveforms. 10/15; I did talk with Dr. Darron Doom and asked her to order a CRP and sedimentation rate. Based on the lab work that she is doing inflammatory markers white count etc. we will order an MRI of the  left foot [see discussion above]. She actually stated that she has not really been following him regularly so was not really aware of what had been done in March. His wound actually looks somewhat better today and slightly smaller in terms of surface area 10/22; surprisingly the patient's labs that I ordered with Dr. Dennie Fetters assistance were not that bad. White count was 4.9 differential count normal. Sedimentation rate was 23 comprehensive metabolic panel was normal. Hemoglobin A1c 5.9 I thought I saw the C-reactive protein but I cannot put my finger on this right at the moment. His wound actually looks somewhat better measures better. He did however have some fluorescent green drainage 11/5; the patient's wound is come in a centimeter in width. We have been using silver alginate he has been changing the dressing himself.. We do not have a way to aggressively offload this as he will has a right leg prosthesis. Nursing still reports green drainage. I gave him antibiotics for this 11/19; the patient's wound came in again this time however he is complaining of pain in the inferior part of the wound on the left plantar foot. This is where he is undermining area is I have obtained a culture for CandS and I am going to give him empiric antibiotics. He has a complicated wound history including a right BKA and a left transmetatarsal amputation. The wound is roughly on the third met head remanent 12/7; patient has not been here in 3 weeks. I think this is a transportation issue. I gave him doxycycline last time is here. The culture showed strep and he was changed to Augmentin in my absence. He he is completing this. X-ray however raised the suspicion of osteomyelitis and he has an MRI booked for Wednesday i.e. in 2 days. We will use calcium alginate until then 12/14; patient comes in today  with purulent drainage noted by her intake nurses. He is completing Augmentin that was given for him last time. His  MRI was done on 12/9; this showed osteomyelitis of the remanent of the shaft of the second metatarsal. We have been using silver alginate 12/21; wound is on the plantar aspect of his foot. I do not see any evidence of current infection. His MRI done early this month showed osteomyelitis of the remanent of the shaft of the second metatarsal. He sees Dr. Sharol Given tomorrow. He has an infectious disease appointment on January 4. I am hopeful that we can make a decision about how to approach this. Hopefully with some foot preservation 12/28; wound is on the plantar aspect of his left foot. Previous right BKA. He did not see Dr. Sharol Given last week but has an appointment to see him next week. He did see the PA who noted that he does have an MRI that suggested osteomyelitis of the remanent of the shaft of the second metatarsal. I am interested in knowing whether Dr. Sharol Given feels a surgical approach is possible short of a BKA. He also has an appointment with infectious disease. I have told the patient that he may have a difficult decision to make. Were not making a lot of progress. This is a usual problem when you have a wound on the plantar with a BKA on the other side. There is really no way to offload this it is easy. Objective Constitutional Vitals Time Taken: 8:50 AM, Height: 76 in, Weight: 240 lbs, BMI: 29.2, Temperature: 98.3 F, Pulse: 68 bpm, Respiratory Rate: 18 breaths/min, Blood Pressure: 144/63 mmHg, Capillary Blood Glucose: 120 mg/dl. General Notes: patient stated last CBG was 120 Integumentary (Hair, Skin) Wound #1 status is Open. Original cause of wound was Gradually Appeared. The wound is located on the [place]. The wound measures 3.5cm length x 1.8cm width x 0.3cm depth; 4.948cm^2 area and 1.484cm^3 volume. There is Fat Layer (Subcutaneous Tissue) Exposed exposed. There is no tunneling or undermining noted. There is a medium amount of purulent drainage noted. The wound margin is  thickened. There is large (67-100%) pink granulation within the wound bed. There is no necrotic tissue within the wound bed. 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 There was a Excisional Skin/Subcutaneous Tissue Debridement with a total area of 6.3 sq cm performed by [name]., MD. With the following instrument(s): Curette to remove Viable and Non-Viable tissue/material. Material removed includes Callus, Subcutaneous Tissue, and Skin: Epidermis after achieving pain control using Other (Benzocaine 20%). No specimens were taken. A time out was conducted at 09:15, prior to the start of the procedure. A Minimum amount of bleeding was controlled with Pressure. The procedure was tolerated well with a pain level of 0 throughout and a pain level of 0 following the procedure. Post Debridement Measurements: 3.5cm length x 1.8cm width x 0.3cm depth; 1.484cm^3 volume. Character of Wound/Ulcer Post Debridement is improved. Severity of Tissue Post Debridement is: Fat layer exposed. Post procedure Diagnosis Wound #1: Same as Pre-Procedure Plan Follow-up Appointments: Return Appointment in 2 weeks. Dressing Change Frequency: Wound #1 Left,Plantar Foot: Change Dressing every other day. Skin Barriers/Peri-Wound Care: Moisturizing  lotion - to left leg daily. Wound Cleansing: Wound #1 Left,Plantar Foot: May shower and wash wound with soap and water. - with dressing changes. Primary Wound Dressing: Wound #1 Left,Plantar Foot: Calcium Alginate with Silver Secondary Dressing: Wound #1 Left,Plantar Foot: Kerlix/Rolled  Gauze ABD pad Other: - foam donut Edema Control: Avoid standing for long periods of time Elevate legs to the level of the heart or above for 30 minutes daily and/or when sitting, a frequency of: - throughout the day. Additional Orders / Instructions: Stop/Decrease Smoking Follow Nutritious Diet - increase protein and vegetables in diabetic diet. 1. I am continuing with silver alginate 2. We will see him again in 2 weeks time hopefully to review what Dr. Sharol Given and infectious disease have to say. 3. I told the patient to an expected incision here. I am hopeful that there may be a foot conserving option to the osteomyelitis of the second metatarsal if he elects for further surgery. 4. I do not believe there is an arterial issue Electronic Signature(s) Signed: 07/29/2019 6:10:19 PM By: [name] MD Entered By: [name] on 07/29/2019 09:30:35 -------------------------------------------------------------------------------- SuperBill Details Patient Name: Date of Service: Cory Palmer 07/29/2019 Medical Record [id number] Patient Account Number: [id number] Date of Birth/Sex: Treating RN: 05/06/1961 (58 y.o. 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

## 2019-08-05 ENCOUNTER — Other Ambulatory Visit: Payer: Self-pay

## 2019-08-05 ENCOUNTER — Ambulatory Visit (INDEPENDENT_AMBULATORY_CARE_PROVIDER_SITE_OTHER): Payer: Medicare Other | Admitting: Internal Medicine

## 2019-08-05 ENCOUNTER — Encounter: Payer: Self-pay | Admitting: Internal Medicine

## 2019-08-05 VITALS — BP 136/89 | HR 87

## 2019-08-05 DIAGNOSIS — M86161 Other acute osteomyelitis, right tibia and fibula: Secondary | ICD-10-CM

## 2019-08-05 MED ORDER — DOXYCYCLINE MONOHYDRATE 100 MG PO CAPS: 100.0000 mg | ORAL_CAPSULE | Freq: Two times a day (BID) | ORAL | 1 refills | Status: DC

## 2019-08-05 NOTE — Progress Notes (Signed)
[name]      Reason for Consult: osteomyelitis    Referring Physician: Dr. Dellia Nims    Patient ID: Cory Palmer, male    DOB: [birth year], 60 y.o.   MRN: [id number]  HPI:   Here for evaluation of osteomyelitis of his left foot at the remnant of the shaft of the second metatarsal.  He has had intermittent drainage from the area and has been on multiple courses of antibiotics including Keflex and more recently doxycycline, I believe started after his 11/19 visit.  He has had an MRI c/w osteomyelitis, as described above, and at his visit 12/7 changed to Augmentin with a wound swab with Group A and B Strep.  He though now is back on doxycycline, though he is unsure of how long.  He has noted very good improvement in his wound and is now closed.  No swelling, no pus.    Past Medical History:  Diagnosis Date  . Anxiety   . Depressed bipolar disorder ([place])   . Diabetes mellitus without complication ([place])   . Hypertension   . Osteomyelitis of toe of left foot ([place])     Prior to Admission medications   Medication Sig Start Date End Date Taking? Authorizing Provider  acetaminophen (TYLENOL) 325 MG tablet Take 2 tablets (650 mg total) by mouth every 6 (six) hours as needed for mild pain (or Fever >/= 101). 05/10/18   Danford, Suann Larry, MD  amoxicillin-clavulanate (AUGMENTIN) 875-125 MG tablet Take 1 tablet by mouth 2 (two) times daily. 06/26/19   [provider]  benztropine (COGENTIN) 0.5 MG tablet  05/29/19   [provider]  Blood Glucose Monitoring Suppl (ACCU-CHEK AVIVA PLUS) w/Device KIT  05/23/19   [provider]  cephALEXin (KEFLEX) 500 MG capsule Take 500 mg by mouth 4 (four) times daily. 05/23/19   [provider]  doxycycline (MONODOX) 100 MG capsule Take 100 mg by mouth 2 (two) times daily. 06/20/19   [provider]  feeding supplement, GLUCERNA SHAKE, (GLUCERNA SHAKE) LIQD Take 237 mLs by mouth 2  (two) times daily between meals. 05/10/18   Danford, Suann Larry, MD  insulin aspart (NOVOLOG) 100 UNIT/ML injection Inject 0-9 Units into the skin 3 (three) times daily with meals. 11/02/18   Roxan Hockey, MD  loratadine (CLARITIN) 10 MG tablet Take 1 tablet (10 mg total) by mouth daily as needed for allergies, rhinitis or itching. 11/02/18   Roxan Hockey, MD  metFORMIN (GLUCOPHAGE) 1000 MG tablet Take 1 tablet (1,000 mg total) by mouth 2 (two) times daily with a meal. 08/28/18   Ward, Delice Bison, DO  Multiple Vitamin (MULTIVITAMIN WITH MINERALS) TABS tablet Take 1 tablet by mouth daily. 05/11/18   Danford, Suann Larry, MD  pravastatin (PRAVACHOL) 40 MG tablet Take 40 mg by mouth daily. 07/04/19   [provider]  QUEtiapine (SEROQUEL) 200 MG tablet Take 1 tablet (200 mg total) by mouth at bedtime. 11/02/18   Roxan Hockey, MD  QUEtiapine (SEROQUEL) 300 MG tablet Take 300 mg by mouth at bedtime. 08/01/19   [provider]  sertraline (ZOLOFT) 25 MG tablet Take 25 mg by mouth daily. 12/03/18   [provider]  sertraline (ZOLOFT) 50 MG tablet Take 50 mg by mouth daily. 08/01/19   [provider]  tamsulosin (FLOMAX) 0.4 MG CAPS capsule Take 0.4 mg by mouth daily. 12/03/18   [provider]  traZODone (DESYREL) 100 MG tablet Take 100 mg by mouth  at bedtime. 04/10/19   [provider]  traZODone (DESYREL) 50 MG tablet Take 1 tablet (50 mg total) by mouth at bedtime as needed for sleep. 11/02/18   Roxan Hockey, MD    No Known Allergies  Social History   Tobacco Use  . Smoking status: Current Every Day Smoker    Types: Cigarettes  . Smokeless tobacco: Never Used  Substance Use Topics  . Alcohol use: Yes    Comment: occasional  . Drug use: Yes    Types: Cocaine    Family History  Problem Relation Age of Onset  . Hypertension Other   . Diabetes Mellitus II Other     Review of Systems  Constitutional: negative for fevers and  chills Gastrointestinal: negative for diarrhea Integument/breast: negative for rash All other systems reviewed and are negative    Physical Exam: Constitutional: in no apparent distress  Musculoskeletal: right leg with prosthesis, left leg with ulcer with good granulation tissue, not open to bone, no surrounding erythema, no drainage, no swelling Skin: negatives: no rash  Labs: Lab Results  Component Value Date   WBC 6.2 12/19/2018   HGB 13.0 12/19/2018   HCT 41.1 12/19/2018   MCV 97.9 12/19/2018   PLT 95 (L) 12/19/2018    Lab Results  Component Value Date   CREATININE 0.81 12/19/2018   BUN 9 12/19/2018   NA 136 12/19/2018   K 4.6 12/19/2018   CL 106 12/19/2018   CO2 16 (L) 12/19/2018    Lab Results  Component Value Date   ALT 11 10/30/2018   AST 18 10/30/2018   ALKPHOS 102 10/30/2018   BILITOT 1.3 (H) 10/30/2018     Assessment: acute osteomyelitis.  It appears he has had some improvement with treatment on doxycycline.  I would most suspect the organism is MSSA which he had in his bloodstream previously in the Spring.  Therefore, I hope that the doxycycline will continue to work.  I will check his inflammatory markers today as well for a baseline and comparison to past.  I also will be interested in Dr. Jess Barters assessment and I did caution that it still may be very hard to cure.  I may consider IV therapy later but he may be on the right track now.   Plan: 1) continue with doxycycline another month 2) return in 2 weeks to recheck wound and inflammatory markers

## 2019-08-06 LAB — SEDIMENTATION RATE: Sed Rate: 31 mm/h — ABNORMAL HIGH (ref 0–20)

## 2019-08-06 LAB — C-REACTIVE PROTEIN: CRP: 11 mg/L — ABNORMAL HIGH (ref <8.0)

## 2019-08-08 ENCOUNTER — Other Ambulatory Visit: Payer: Self-pay

## 2019-08-08 ENCOUNTER — Encounter: Payer: Self-pay | Admitting: Orthopedic Surgery

## 2019-08-08 ENCOUNTER — Ambulatory Visit (INDEPENDENT_AMBULATORY_CARE_PROVIDER_SITE_OTHER): Payer: Medicare Other | Admitting: Orthopedic Surgery

## 2019-08-08 VITALS — Ht 75.0 in | Wt 260.0 lb

## 2019-08-08 DIAGNOSIS — Z89432 Acquired absence of left foot: Secondary | ICD-10-CM

## 2019-08-08 DIAGNOSIS — L97521 Non-pressure chronic ulcer of other part of left foot limited to breakdown of skin: Secondary | ICD-10-CM

## 2019-08-08 DIAGNOSIS — Z89511 Acquired absence of right leg below knee: Secondary | ICD-10-CM

## 2019-08-08 NOTE — Progress Notes (Signed)
Office Visit Note   Patient: Cory Palmer           Date of Birth: 26-Sep-1960           MRN: [id number] Visit Date: 08/08/2019              Requested by: No referring provider defined for this encounter. PCP: Patient, [name] Per  Chief Complaint  Patient presents with  . Left Foot - Follow-up    09/28/18 left transmet amputation       HPI: Patient is a 59 year old gentleman who is status post right transtibial amputation left transmetatarsal amputation and has recently been started on doxycycline for the ulcer plantar aspect left transmetatarsal amputation.  Assessment & Plan: Visit Diagnoses:  1. S/P transmetatarsal amputation of foot, left ([place])   2. Non-pressure chronic ulcer of other part of left foot limited to breakdown of skin ([place])   3. S/P below knee amputation, right ([place])     Plan: Patient's wound is improving nicely he will complete his course of doxycycline we will apply Iodosorb plus a dry dressing today he will continue with minimize weightbearing dressing change daily.  Follow-Up Instructions: Return in about 4 weeks (around 09/05/2019).   Ortho Exam  Patient is alert, oriented, no adenopathy, well-dressed, normal affect, normal respiratory effort. Examination patient has a stable right transtibial amputation currently wearing a prosthesis left foot his foot is plantigrade he has a superficial wound on the left plantar foot that is 20 x 45 mm 1 mm deep with healthy granulation tissue no cellulitis no drainage no odor no signs of infection.  Imaging: No results found. No images are attached to the encounter.  Labs: Lab Results  Component Value Date   HGBA1C 6.6 (H) 09/28/2018   HGBA1C 7.5 (H) 05/08/2018   ESRSEDRATE 31 (H) 08/05/2019   ESRSEDRATE 33 (H) 10/28/2018   ESRSEDRATE 16 05/07/2018   CRP 11.0 (H) 08/05/2019   CRP <0.8 10/28/2018   CRP <0.8 05/07/2018   REPTSTATUS 06/25/2019 FINAL 06/20/2019   GRAMSTAIN NO WBC SEEN ABUNDANT GRAM POSITIVE  COCCI  06/20/2019   CULT  06/20/2019    FEW GROUP A STREP (S.PYOGENES) ISOLATED FEW GROUP B STREP(S.AGALACTIAE)ISOLATED TESTING AGAINST S. AGALACTIAE NOT ROUTINELY PERFORMED DUE TO PREDICTABILITY OF AMP/PEN/VAN SUSCEPTIBILITY. NO ANAEROBES ISOLATED Performed at [facility], [place] [address], [place], [place] [address]    LABORGA STAPHYLOCOCCUS AUREUS 10/04/2018   LABORGA STREPTOCOCCUS GROUP G 10/04/2018     Lab Results  Component Value Date   ALBUMIN 2.9 (L) 10/30/2018   ALBUMIN 3.4 (L) 10/29/2018   ALBUMIN 3.5 10/28/2018   PREALBUMIN 11.2 (L) 05/08/2018    Lab Results  Component Value Date   MG 1.6 (L) 10/18/2018   MG 1.5 (L) 10/17/2018   MG 1.8 10/10/2018   No results found for: VD25OH  Lab Results  Component Value Date   PREALBUMIN 11.2 (L) 05/08/2018   CBC EXTENDED Latest Ref Rng & Units 12/19/2018 10/30/2018 10/29/2018  WBC 4.0 - 10.5 K/uL 6.2 4.6 6.9  RBC 4.22 - 5.81 MIL/uL 4.20(L) 2.91(L) 3.22(L)  HGB 13.0 - 17.0 g/dL 13.0 9.4(L) 10.4(L)  HCT 39.0 - 52.0 % 41.1 27.8(L) 30.3(L)  PLT 150 - 400 K/uL 95(L) 82(L) 105(L)  NEUTROABS 1.7 - 7.7 K/uL - - -  LYMPHSABS 0.7 - 4.0 K/uL - - -     Body mass index is 32.5 kg/m.  Orders:  No orders of the defined types were placed in this  encounter.  No orders of the defined types were placed in this encounter.    Procedures: No procedures performed  Clinical Data: No additional findings.  ROS:  All other systems negative, except as noted in the HPI. Review of Systems  Objective: Vital Signs: Ht 6\' 3"  (1.905 m)   Wt 260 lb (117.9 kg)   BMI 32.50 kg/m   Specialty Comments:  No specialty comments available.  PMFS History: Patient Active Problem List   Diagnosis Date Noted  . Serum total bilirubin elevated 10/29/2018  . BPH (benign prostatic hyperplasia) 10/29/2018  . Non-pressure chronic ulcer of other part of left foot limited to breakdown of skin ([facility])   . Below-knee amputation of right lower  extremity ([facility])   . Hx of BKA, right ([facility])   . PAF (paroxysmal atrial fibrillation) ([facility])   . Bacteremia due to Gram-positive bacteria   . Hypokalemia   . Hypomagnesemia   . Sepsis without acute organ dysfunction ([facility]) 10/05/2018  . Depressed bipolar disorder ([facility]) 10/05/2018  . Hypertension 10/05/2018  . Hyperbilirubinemia 10/05/2018  . S/P transmetatarsal amputation of foot, 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  . Wound infection 05/07/2018  . Homelessness 05/07/2018  . Diabetes mellitus type 2, noninsulin dependent ([facility]) 05/07/2018  . Acute osteomyelitis involving lower leg, right ([facility]) 05/07/2018   Past Medical History:  Diagnosis Date  . Anxiety   . Depressed bipolar disorder ([facility])   . Diabetes mellitus without complication ([facility])   . Hypertension   . Osteomyelitis of toe of left foot ([facility])     Family History  Problem Relation Age of Onset  . Hypertension Other   . Diabetes Mellitus II Other     Past Surgical History:  Procedure Laterality Date  . AMPUTATION Left 09/28/2018   Procedure: LEFT TRANSMETATARSAL AMPUTATION;  Surgeon: 09/30/2018, MD;  Location: [facility] OR;  Service: Orthopedics;  Laterality: Left;  . BELOW KNEE LEG AMPUTATION Right    Social History   Occupational History  . Not on file  Tobacco Use  . Smoking status: Current Every Day Smoker    Types: Cigarettes  . Smokeless tobacco: Never Used  Substance and Sexual Activity  . Alcohol use: Yes    Comment: occasional  . Drug use: Yes    Types: Cocaine  . Sexual activity: Not on file

## 2019-08-12 ENCOUNTER — Other Ambulatory Visit: Payer: Self-pay

## 2019-08-12 ENCOUNTER — Encounter (HOSPITAL_BASED_OUTPATIENT_CLINIC_OR_DEPARTMENT_OTHER): Payer: Medicare Other | Attending: Internal Medicine | Admitting: Internal Medicine

## 2019-08-12 DIAGNOSIS — F319 Bipolar disorder, unspecified: Secondary | ICD-10-CM | POA: Diagnosis not present

## 2019-08-12 DIAGNOSIS — E1142 Type 2 diabetes mellitus with diabetic polyneuropathy: Secondary | ICD-10-CM | POA: Diagnosis not present

## 2019-08-12 DIAGNOSIS — Z89511 Acquired absence of right leg below knee: Secondary | ICD-10-CM | POA: Diagnosis not present

## 2019-08-12 DIAGNOSIS — M86672 Other chronic osteomyelitis, left ankle and foot: Secondary | ICD-10-CM | POA: Diagnosis not present

## 2019-08-12 DIAGNOSIS — L97422 Non-pressure chronic ulcer of left heel and midfoot with fat layer exposed: Secondary | ICD-10-CM | POA: Diagnosis present

## 2019-08-12 DIAGNOSIS — I4891 Unspecified atrial fibrillation: Secondary | ICD-10-CM | POA: Diagnosis not present

## 2019-08-12 DIAGNOSIS — I509 Heart failure, unspecified: Secondary | ICD-10-CM | POA: Insufficient documentation

## 2019-08-12 DIAGNOSIS — E11621 Type 2 diabetes mellitus with foot ulcer: Secondary | ICD-10-CM | POA: Insufficient documentation

## 2019-08-13 NOTE — Progress Notes (Signed)
Palmer, Cory ([id number]) Visit Report for 08/12/2019 Debridement Details Patient Name: Date of Service: Cory Palmer, Cory Palmer 08/12/2019 8:30 AM Medical Record [id number] Patient Account Number: [id number] Date of Birth/Sex: October 15, 1960 (59 y.o. M) Treating RN: Primary Care Provider: PATIENT, NO Other Clinician: Referring Provider: Treating Provider/Extender:Chasidy Janak, [name] in Treatment: 13 Debridement Performed for Wound #1 Left,Plantar Foot Assessment: Performed By: Physician [name]., MD Debridement Type: Debridement Severity of Tissue Pre Fat layer exposed Debridement: Level of Consciousness (Pre- Awake and Alert procedure): Pre-procedure Verification/Time Out Taken: Yes - 09:26 Start Time: 09:26 Pain Control: Other : Benzocaine 20% Total Area Debrided (L x W): 4.8 (cm) x 3.8 (cm) = 18.24 (cm) Tissue and other material Viable, Non-Viable, Slough, Subcutaneous, Slough debrided: Level: Skin/Subcutaneous Tissue Debridement Description: Excisional Instrument: Curette Bleeding: Moderate Hemostasis Achieved: Pressure End Time: 09:28 Procedural Pain: 0 Post Procedural Pain: 0 Response to Treatment: Procedure was tolerated well Level of Consciousness Awake and Alert (Post-procedure): Post Debridement Measurements of Total Wound Length: (cm) 4.8 Width: (cm) 3.8 Depth: (cm) 0.2 Volume: (cm) 2.865 Character of Wound/Ulcer Post Improved Debridement: Severity of Tissue Post Debridement: Fat layer exposed Post Procedure Diagnosis Same as Pre-procedure Electronic Signature(s) Signed: 08/12/2019 6:04:26 PM By: [name] MD Entered By: [name] on 08/12/2019 10:35:12 -------------------------------------------------------------------------------- HPI Details Patient Name: Date of Service: Cory Palmer 08/12/2019 8:30 AM Medical Record [id number] Patient Account Number: [id number] Date of Birth/Sex: Treating RN: [birth year] (59 y.o.  M) Primary Care Provider: PATIENT, NO Other Clinician: Referring Provider: Treating Provider/Extender:Olivia Royse, [name] in Treatment: 13 History of Present Illness HPI Description: ADMISSION 05/09/2019 This is a 59 year old man with type 2 diabetes. He has had a previous right BKA done in [place] 4 years ago. Looking back through [place] link this year he had a left fourth toe wound in January 2020. By looking at the pictures he had also had previous amputation of the first second and third toes as well as a right BKA. 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 Sharol Given on 09/28/2018. I believe at some point he dehisced the amputation as he had a wound VAC in early March. He was admitted to hospital from 10/04/2018 through 10/18/2018 with MSSA/strep sepsis. A TEE was negative. I do not believe during this admission he was felt to have osteomyelitis however an x-ray on 10/28/2018 showed concern for osteomyelitis at the remaining second third and fourth metatarsals. An x-ray on 12/19/2018 showed progressive new bone formation at the transmetatarsal amputation sites of the second through fifth digits consistent with either acute or chronic osteomyelitis. It is hard to tell although after the discharge in March it looks as though he had a course of vancomycin and may be oral antibiotics although I am not sure about this or the duration of antibiotic therapy. The patient states that the wound opened up several months ago. It was clear that there was a wound on the plantar left foot on 12/27/2018. He was in the ER on 9/20 with a large wound on the left plantar foot given Silvadene. The patient states he has been putting bizarre combinations of things on this wound including castor oil. Past medical history; congestive heart failure, type 2 diabetes, bipolar disorder, right BKA, remote history of substance abuse, atrial fibrillation Arterial studies done on the left and  05/06/2018 showed an ABI of 1.21 with triphasic waveforms. 10/15; I did talk with Dr. Darron Doom and asked her to order a CRP and sedimentation rate. Based on the  lab work that she is doing inflammatory markers white count etc. we will order an MRI of the left foot [see discussion above]. She actually stated that she has not really been following him regularly so was not really aware of what had been done in March. His wound actually looks somewhat better today and slightly smaller in terms of surface area 10/22; surprisingly the patient's labs that I ordered with Dr. Dennie Fetters assistance were not that bad. White count was 4.9 differential count normal. Sedimentation rate was 23 comprehensive metabolic panel was normal. Hemoglobin A1c 5.9 I thought I saw the C-reactive protein but I cannot put my finger on this right at the moment. His wound actually looks somewhat better measures better. He did however have some fluorescent green drainage 11/5; the patient's wound is come in a centimeter in width. We have been using silver alginate he has been changing the dressing himself.. We do not have a way to aggressively offload this as he will has a right leg prosthesis. Nursing still reports green drainage. I gave him antibiotics for this 11/19; the patient's wound came in again this time however he is complaining of pain in the inferior part of the wound on the left plantar foot. This is where he is undermining area is I have obtained a culture for CandS and I am going to give him empiric antibiotics. He has a complicated wound history including a right BKA and a left transmetatarsal amputation. The wound is roughly on the third met head remanent 12/7; patient has not been here in 3 weeks. I think this is a transportation issue. I gave him doxycycline last time is here. The culture showed strep and he was changed to Augmentin in my absence. He he is completing this. X-ray however raised the suspicion of  osteomyelitis and he has an MRI booked for Wednesday i.e. in 2 days. We will use calcium alginate until then 12/14; patient comes in today with purulent drainage noted by her intake nurses. He is completing Augmentin that was given for him last time. His MRI was done on 12/9; this showed osteomyelitis of the remanent of the shaft of the second metatarsal. We have been using silver alginate 12/21; wound is on the plantar aspect of his foot. I do not see any evidence of current infection. His MRI done early this month showed osteomyelitis of the remanent of the shaft of the second metatarsal. He sees Dr. Sharol Given tomorrow. He has an infectious disease appointment on January 4. I am hopeful that we can make a decision about how to approach this. Hopefully with some foot preservation 12/28; wound is on the plantar aspect of his left foot. Previous right BKA. He did not see Dr. Sharol Given last week but has an appointment to see him next week. He did see the PA who noted that he does have an MRI that suggested osteomyelitis of the remanent of the shaft of the second metatarsal. I am interested in knowing whether Dr. Sharol Given feels a surgical approach is possible short of a BKA. He also has an appointment with infectious disease. I have told the patient that he may have a difficult decision to make. Were not making a lot of progress. This is a usual problem when you have a wound on the plantar with a BKA on the other side. There is really no way to offload this it is easy. 08/12/2019. Wound on the plantar aspect of his left foot with in the setting of a  previous right BKA. He saw both Dr. Novella Olive and infectious disease and Dr. Sharol Given. This is for osteomyelitis at the remanent of the shaft of the second metatarsal. Dr. Novella Olive makes a comment about him being back on doxycycline although I do not have this in my record but he felt doxycycline was appropriate and he is now on a month worth of this. Possible IV antibiotics.  He is going to have inflammatory markers repeated in 2 weeks. The patient also saw Dr. Sharol Given on 1/7. He felt his wound was improving. He did not comment on the osteomyelitis. It is impossible to adequately offload the left leg with the right BKA. He could not handle a total contact cast. Also notable for the fact he does not drive so he walks 2 blocks to the grocery store. Electronic Signature(s) Signed: 08/12/2019 6:04:26 PM By: [name] MD Entered By: [name] on 08/12/2019 10:37:30 -------------------------------------------------------------------------------- Physical Exam Details Patient Name: Date of Service: HAIRO, GARRAWAY 08/12/2019 8:30 AM Medical Record [id number] Patient Account Number: [id number] Date of Birth/Sex: Treating RN: 1961/01/22 (59 y.o. M) Primary Care Provider: PATIENT, NO Other Clinician: Referring Provider: Treating Provider/Extender:Solina Heron, [name] in Treatment: 13 Constitutional Sitting or standing Blood Pressure is within target range for patient.. Pulse regular and within target range for patient.Marland Kitchen Respirations regular, non-labored and within target range.. Temperature is normal and within the target range for the patient.Marland Kitchen Appears in no distress. Notes Wound exam; not much major change although the surface area is larger by our intake nurses measurements. Using a #5 curette a reasonably extensive debridement of senescent edges and fibrinous debris over the wound surface. Hemostasis with direct pressure Electronic Signature(s) Signed: 08/12/2019 6:04:26 PM By: [name] MD Entered By: [name] on 08/12/2019 10:38:19 -------------------------------------------------------------------------------- Physician Orders Details Patient Name: Date of Service: ASHELY, JOSHUA 08/12/2019 8:30 AM Medical Record [id number] Patient Account Number: [id number] Date of Birth/Sex: Treating RN: 1961-07-10 (59 y.o. [name] Primary Care Provider: PATIENT, NO Other Clinician: Referring Provider: Treating Provider/Extender:Iliyah Bui, [name] in Treatment: 41 Verbal / Phone Orders: No Diagnosis Coding ICD-10 Coding Code Description E11.621 Type 2 diabetes mellitus with foot ulcer E11.42 Type 2 diabetes mellitus with diabetic polyneuropathy Z89.511 Acquired absence of right leg below knee M86.672 Other chronic osteomyelitis, left ankle and foot Follow-up Appointments Return Appointment in 1 week. Dressing Change Frequency Wound #1 Left,Plantar Foot Change Dressing every other day. Skin Barriers/Peri-Wound Care Moisturizing lotion - to left leg daily. Wound Cleansing Wound #1 Left,Plantar Foot May shower and wash wound with soap and water. - with dressing changes. Primary Wound Dressing Wound #1 Left,Plantar Foot Hydrofera Blue Secondary Dressing Wound #1 Left,Plantar Foot Kerlix/Rolled Gauze ABD pad Edema Control Avoid standing for long periods of time Elevate legs to the level of the heart or above for 30 minutes daily and/or when sitting, a frequency of: - throughout the day. Additional Orders / Instructions Stop/Decrease Smoking Follow Nutritious Diet - increase protein and vegetables in diabetic diet. Electronic Signature(s) Signed: 08/12/2019 6:04:26 PM By: [name] MD Signed: 08/13/2019 6:18:44 PM By: Levan Hurst RN, BSN Entered By: Levan Hurst on 08/12/2019 09:29:33 -------------------------------------------------------------------------------- Problem List Details Patient Name: Date of Service: ANGELUS, HOOPES 08/12/2019 8:30 AM Medical Record [id number] Patient Account Number: [id number] Date of Birth/Sex: Treating RN: 03-Jun-1961 (59 y.o. [name] Primary Care Provider: PATIENT, NO Other Clinician: Referring Provider: Treating Provider/Extender:Laityn Bensen, [name] in Treatment: 13 Active Problems ICD-10 Evaluated Encounter Code  Description Active Date Today Diagnosis E11.621  Type 2 diabetes mellitus with foot ulcer 05/09/2019 No Yes E11.42 Type 2 diabetes mellitus with diabetic polyneuropathy 05/09/2019 No Yes Z89.511 Acquired absence of right leg below knee 05/09/2019 No Yes M86.672 Other chronic osteomyelitis, left ankle and foot 05/09/2019 No Yes Inactive Problems Resolved Problems Electronic Signature(s) Signed: 08/12/2019 6:04:26 PM By: [name] MD Entered By: [name] on 08/12/2019 10:34:53 -------------------------------------------------------------------------------- Progress Note Details Patient Name: Date of Service: Cory Palmer 08/12/2019 8:30 AM Medical Record [id number] Patient Account Number: [id number] Date of Birth/Sex: Treating RN: June 26, 1961 (59 y.o. M) Primary Care Provider: PATIENT, NO Other Clinician: Referring Provider: Treating Provider/Extender:Alayja Armas, [name] in Treatment: 13 Subjective History of Present Illness (HPI) ADMISSION 05/09/2019 This is a 59 year old man with type 2 diabetes. He has had a previous right BKA done in [place] 4 years ago. Looking back through [place] link this year he had a left fourth toe wound in January 2020. By looking at the pictures he had also had previous amputation of the first second and third toes as well as a right BKA. 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 Sharol Given on 09/28/2018. I believe at some point he dehisced the amputation as he had a wound VAC in early March. He was admitted to hospital from 10/04/2018 through 10/18/2018 with MSSA/strep sepsis. A TEE was negative. I do not believe during this admission he was felt to have osteomyelitis however an x-ray on 10/28/2018 showed concern for osteomyelitis at the remaining second third and fourth metatarsals. An x-ray on 12/19/2018 showed progressive new bone formation at the transmetatarsal amputation sites of the second through fifth  digits consistent with either acute or chronic osteomyelitis. It is hard to tell although after the discharge in March it looks as though he had a course of vancomycin and may be oral antibiotics although I am not sure about this or the duration of antibiotic therapy. The patient states that the wound opened up several months ago. It was clear that there was a wound on the plantar left foot on 12/27/2018. He was in the ER on 9/20 with a large wound on the left plantar foot given Silvadene. The patient states he has been putting bizarre combinations of things on this wound including castor oil. Past medical history; congestive heart failure, type 2 diabetes, bipolar disorder, right BKA, remote history of substance abuse, atrial fibrillation Arterial studies done on the left and 05/06/2018 showed an ABI of 1.21 with triphasic waveforms. 10/15; I did talk with Dr. Darron Doom and asked her to order a CRP and sedimentation rate. Based on the lab work that she is doing inflammatory markers white count etc. we will order an MRI of the left foot [see discussion above]. She actually stated that she has not really been following him regularly so was not really aware of what had been done in March. His wound actually looks somewhat better today and slightly smaller in terms of surface area 10/22; surprisingly the patient's labs that I ordered with Dr. Dennie Fetters assistance were not that bad. White count was 4.9 differential count normal. Sedimentation rate was 23 comprehensive metabolic panel was normal. Hemoglobin A1c 5.9 I thought I saw the C-reactive protein but I cannot put my finger on this right at the moment. His wound actually looks somewhat better measures better. He did however have some fluorescent green drainage 11/5; the patient's wound is come in a centimeter in width. We have been using silver alginate he has been changing the dressing  himself.. We do not have a way to aggressively offload this as he  will has a right leg prosthesis. Nursing still reports green drainage. I gave him antibiotics for this 11/19; the patient's wound came in again this time however he is complaining of pain in the inferior part of the wound on the left plantar foot. This is where he is undermining area is I have obtained a culture for CandS and I am going to give him empiric antibiotics. He has a complicated wound history including a right BKA and a left transmetatarsal amputation. The wound is roughly on the third met head remanent 12/7; patient has not been here in 3 weeks. I think this is a transportation issue. I gave him doxycycline last time is here. The culture showed strep and he was changed to Augmentin in my absence. He he is completing this. X-ray however raised the suspicion of osteomyelitis and he has an MRI booked for Wednesday i.e. in 2 days. We will use calcium alginate until then 12/14; patient comes in today with purulent drainage noted by her intake nurses. He is completing Augmentin that was given for him last time. His MRI was done on 12/9; this showed osteomyelitis of the remanent of the shaft of the second metatarsal. We have been using silver alginate 12/21; wound is on the plantar aspect of his foot. I do not see any evidence of current infection. His MRI done early this month showed osteomyelitis of the remanent of the shaft of the second metatarsal. He sees Dr. Sharol Given tomorrow. He has an infectious disease appointment on January 4. I am hopeful that we can make a decision about how to approach this. Hopefully with some foot preservation 12/28; wound is on the plantar aspect of his left foot. Previous right BKA. He did not see Dr. Sharol Given last week but has an appointment to see him next week. He did see the PA who noted that he does have an MRI that suggested osteomyelitis of the remanent of the shaft of the second metatarsal. I am interested in knowing whether Dr. Sharol Given feels a surgical  approach is possible short of a BKA. He also has an appointment with infectious disease. I have told the patient that he may have a difficult decision to make. Were not making a lot of progress. This is a usual problem when you have a wound on the plantar with a BKA on the other side. There is really no way to offload this it is easy. 08/12/2019. Wound on the plantar aspect of his left foot with in the setting of a previous right BKA. He saw both Dr. Novella Olive and infectious disease and Dr. Sharol Given. This is for osteomyelitis at the remanent of the shaft of the second metatarsal. Dr. Novella Olive makes a comment about him being back on doxycycline although I do not have this in my record but he felt doxycycline was appropriate and he is now on a month worth of this. Possible IV antibiotics. He is going to have inflammatory markers repeated in 2 weeks. The patient also saw Dr. Sharol Given on 1/7. He felt his wound was improving. He did not comment on the osteomyelitis. It is impossible to adequately offload the left leg with the right BKA. He could not handle a total contact cast. Also notable for the fact he does not drive so he walks 2 blocks to the grocery store. Objective Constitutional Sitting or standing Blood Pressure is within target range for patient.. Pulse regular and  within target range for patient.Marland Kitchen Respirations regular, non-labored and within target range.. Temperature is normal and within the target range for the patient.Marland Kitchen Appears in no distress. Vitals Time Taken: 8:55 AM, Height: 76 in, Weight: 240 lbs, BMI: 29.2, Temperature: 97.8 F, Pulse: 68 bpm, Respiratory Rate: 19 breaths/min, Blood Pressure: 105/66 mmHg, Capillary Blood Glucose: 136 mg/dl. General Notes: patient stated last CBG was 136 General Notes: Wound exam; not much major change although the surface area is larger by our intake nurses measurements. Using a #5 curette a reasonably extensive debridement of senescent edges and fibrinous  debris over the wound surface. Hemostasis with direct pressure Integumentary (Hair, Skin) Wound #1 status is Open. Original cause of wound was Gradually Appeared. The wound is located on the [place]. The wound measures 4.8cm length x 3.8cm width x 0.2cm depth; 14.326cm^2 area and 2.865cm^3 volume. There is Fat Layer (Subcutaneous Tissue) Exposed exposed. There is no tunneling noted, however, there is undermining starting at 5:00 and ending at 7:00 with a maximum distance of 0.9cm. There is a medium amount of purulent drainage noted. The wound margin is thickened. There is large (67-100%) pink, pale granulation within the wound bed. There is a small (1-33%) amount of necrotic tissue within the wound bed including Adherent Slough. 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 There was a Excisional Skin/Subcutaneous Tissue Debridement with a total area of 18.24 sq cm performed by [name]., MD. With the following instrument(s): Curette to remove Viable and Non-Viable tissue/material. Material removed includes Subcutaneous Tissue and Slough and after achieving pain control using Other (Benzocaine 20%). No specimens were taken. A time out was conducted at 09:26, prior to the start of the procedure. A Moderate amount of bleeding was controlled with Pressure. The procedure was tolerated well with a pain level of 0 throughout and a pain level of 0 following the procedure. Post Debridement Measurements: 4.8cm length x 3.8cm width x 0.2cm depth; 2.865cm^3 volume. Character of Wound/Ulcer Post Debridement is improved.  Severity of Tissue Post Debridement is: Fat layer exposed. Post procedure Diagnosis Wound #1: Same as Pre-Procedure Plan Follow-up Appointments: Return Appointment in 1 week. Dressing Change Frequency: Wound #1 Left,Plantar Foot: Change Dressing every other day. Skin Barriers/Peri-Wound Care: Moisturizing lotion - to left leg daily. Wound Cleansing: Wound #1 Left,Plantar Foot: May shower and wash wound with soap and water. - with dressing changes. Primary Wound Dressing: Wound #1 Left,Plantar Foot: Hydrofera Blue Secondary Dressing: Wound #1 Left,Plantar Foot: Kerlix/Rolled Gauze ABD pad Edema Control: Avoid standing for long periods of time Elevate legs to the level of the heart or above for 30 minutes daily and/or when sitting, a frequency of: - throughout the day. Additional Orders / Instructions: Stop/Decrease Smoking Follow Nutritious Diet - increase protein and vegetables in diabetic diet. 1. I change the primary dressing to Hydrofera Blue 2. I note that he is on doxycycline for a month. 3. No adequate way to offload this other than to tell the patient not to walk as much as he is doing but this is walking appears to be his only method of transportation. I did talk to him about crutches but that would be an option  I will try to talk to him about this next week either that or a scooter Electronic Signature(s) Signed: 08/12/2019 6:04:26 PM By: [name] MD Entered By: [name] on 08/12/2019 10:39:18 -------------------------------------------------------------------------------- SuperBill Details Patient Name: Date of Service: Cory Palmer 08/12/2019 Medical Record [id number] Patient Account Number: [id number] Date of Birth/Sex: Treating RN: 05/30/1961 (59 y.o. 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

## 2019-08-13 NOTE — Progress Notes (Addendum)
Cory Palmer, Cory Palmer ([id number]) Visit Report for 08/12/2019 Arrival Information Details Patient Name: Date of Service: Cory Palmer, Cory Palmer 08/12/2019 8:30 AM Medical Record Cory Palmer:[id number] Patient Account Number: [id number] Date of Birth/Sex: Treating RN: [birth year] (59 y.o. Cory Palmer Primary Care Sameen Leas: PATIENT, NO Other Clinician: Referring Cadence Haslam: Treating Arend Bahl/Extender:Robson, [name] in Treatment: 61 Visit Information History Since Last Visit Cane Added or deleted any medications: Yes Patient Arrived: 08:55 Any new allergies or adverse reactions: No Arrival Time: Had a fall or experienced change in No Accompanied By: self None activities of daily living that may affect Transfer Assistance: risk of falls: Patient Identification Verified: Yes Signs or symptoms of abuse/neglect since last No Secondary Verification Process Completed: Yes visito Patient Requires Transmission-Based No Hospitalized since last visit: No Precautions: Implantable device outside of the clinic excluding No Patient Has Alerts: No cellular tissue based products placed in the center since last visit: Has Dressing in Place as Prescribed: Yes Pain Present Now: No Electronic Signature(s) Signed: 08/12/2019 5:57:16 PM By: Kela Millin Entered By: Kela Millin on 08/12/2019 08:55:49 -------------------------------------------------------------------------------- Encounter Discharge Information Details Patient Name: Date of Service: Cory Palmer 08/12/2019 8:30 AM Medical Record Cory Palmer:[id number] Patient Account Number: [id number] Date of Birth/Sex: Treating RN: [birth year] (59 y.o. Cory Palmer Primary Care Reneka Nebergall: PATIENT, NO Other Clinician: Referring Shuronda Santino: Treating Ladana Chavero/Extender:Robson, [name] in Treatment: 13 Encounter Discharge Information Items Post Procedure Vitals Discharge Condition: Stable Temperature (F): 97.8 Ambulatory Status:  Cane Pulse (bpm): 68 Discharge Destination: Home Respiratory Rate (breaths/min): 18 Transportation: Private Auto Blood Pressure (mmHg): 105/66 Accompanied By: self Schedule Follow-up Appointment: Yes Clinical Summary of Care: Patient Declined Electronic Signature(s) Signed: 08/13/2019 6:12:59 PM By: Baruch Gouty RN, BSN Entered By: Baruch Gouty on 08/12/2019 09:54:39 -------------------------------------------------------------------------------- Lower Extremity Assessment Details Patient Name: Date of Service: Cory Palmer 08/12/2019 8:30 AM Medical Record Cory Palmer:[id number] Patient Account Number: [id number] Date of Birth/Sex: Treating RN: 1961/06/19 (59 y.o. Cory Palmer Primary Care Felisha Claytor: PATIENT, NO Other Clinician: Referring Maeve Debord: Treating Chrystian Ressler/Extender:Robson, [name] in Treatment: 13 Edema Assessment Assessed: [Left: No] [Right: No] Edema: [Left: Ye] [Right: s] Calf Left: Right: Point of Measurement: 43 cm From Medial Instep 40.5 cm cm Ankle Left: Right: Point of Measurement: 10 cm From Medial Instep 25 cm cm Vascular Assessment Pulses: Dorsalis Pedis Palpable: [Left:Yes] Electronic Signature(s) Signed: 08/12/2019 5:57:16 PM By: Kela Millin Entered By: Kela Millin on 08/12/2019 09:00:39 -------------------------------------------------------------------------------- Multi Wound Chart Details Patient Name: Date of Service: Cory Palmer. 08/12/2019 8:30 AM Medical Record Cory Palmer Patient Account Number: [id number] Date of Birth/Sex: Treating RN: [birth year] (59 y.o. M) Primary Care Timber Marshman: PATIENT, NO Other Clinician: Referring Vita Currin: Treating Elizabelle Fite/Extender:Robson, [name] in Treatment: 13 Vital Signs Height(in): 76 Capillary Blood 136 Glucose(mg/dl): Weight(lbs): 240 Pulse(bpm): 53 Body Mass Index(BMI): 29 Blood Pressure(mmHg): 105/66 Temperature(F):  97.8 Respiratory 19 Rate(breaths/min): Photos: [1:No Photos] [N/A:N/A] Wound Location: [1:Left Foot - Plantar] [N/A:N/A] Wounding Event: [1:Gradually Appeared] [N/A:N/A] Primary Etiology: [1:Diabetic Wound/Ulcer of the N/A Lower Extremity] Comorbid History: [1:Hypertension, Type II Diabetes, Osteomyelitis] [N/A:N/A] Date Acquired: [1:05/04/2018] [N/A:N/A] Weeks of Treatment: [1:13] [N/A:N/A] Wound Status: [1:Open] [N/A:N/A] Measurements L x W x D 4.8x3.8x0.2 [N/A:N/A] (cm) Area (cm) : [1:14.326] [N/A:N/A] Volume (cm) : [1:2.865] [N/A:N/A] % Reduction in Area: [1:-17.80%] [N/A:N/A] % Reduction in Volume: -17.80% [N/A:N/A] Starting Position 1 5 (o'clock): Ending Position 1 [1:7] (o'clock): Maximum Distance 1 [1:0.9] (cm): Undermining: [1:Yes] [N/A:N/A] Classification: [1:Grade 3] [N/A:N/A] Exudate Amount: [1:Medium] [N/A:N/A] Exudate Type: [1:Purulent] [N/A:N/A] Exudate Color: [1:yellow, brown, green] [N/A:N/A] Wound Margin: [1:Thickened] [  N/A:N/A] Granulation Amount: [1:Large (67-100%)] [N/A:N/A] Granulation Quality: [1:Pink, Pale] [N/A:N/A] Necrotic Amount: [1:Small (1-33%)] [N/A:N/A] Exposed Structures: [1:Fat Layer (Subcutaneous N/A Tissue) Exposed: Yes Fascia: No Tendon: No Muscle: No Joint: No Bone: No] Epithelialization: [1:None] [N/A:N/A] Debridement: [1:Debridement - Excisional N/A] Pre-procedure [1:09:26] [N/A:N/A] Verification/Time Out Taken: Pain Control: [1:Other] [N/A:N/A] Tissue Debrided: [1:Subcutaneous, Slough] [N/A:N/A] Level: [1:Skin/Subcutaneous Tissue N/A] Debridement Area (sq cm):18.24 Instrument: [1:Curette] Bleeding: [1:Moderate] Hemostasis Achieved: [1:Pressure] Procedural Pain: [1:0] Post Procedural Pain: [1:0] Debridement Treatment Procedure was tolerated Response: [1:well] Post Debridement [1:4.8x3.8x0.2] Measurements L x W x D (cm) Post Debridement [1:2.865] Volume: (cm) Procedures Performed: Debridement Treatment Notes Wound #1  (Left, Plantar Foot) 3. Primary Dressing Applied Hydrofera Blue 4. Secondary Dressing Dry Gauze Roll Gauze Notes foam donut as secondary. Electronic Signature(s) Signed: 08/12/2019 6:04:26 PM By: [name] MD Entered By: [name] on 08/12/2019 10:35:02 -------------------------------------------------------------------------------- Multi-Disciplinary Care Plan Details Patient Name: Date of Service: Cory Palmer, Cory Palmer 08/12/2019 8:30 AM Medical Record [id number] Patient Account Number: [id number] Date of Birth/Sex: Treating RN: July 12, 1961 (59 y.o. [name] Primary Care Emani Taussig: PATIENT, NO Other Clinician: Referring Crystale Giannattasio: Treating Jurline Folger/Extender:Robson, [name] in Treatment: 13 Active Inactive Nutrition Nursing Diagnoses: Impaired glucose control: actual or potential Potential for alteratiion in Nutrition/Potential for imbalanced nutrition Goals: Patient/caregiver agrees to and verbalizes understanding of need to obtain nutritional consultation Target Resolution Date Initiated: 05/09/2019 Date Inactivated: 07/08/2019 Date: 06/28/2019 Goal Status: Met Patient/caregiver will maintain therapeutic glucose control Date Initiated: 05/09/2019 Target Resolution Date: 08/30/2019 Goal Status: Active Interventions: Assess HgA1c results as ordered upon admission and as needed Provide education on elevated blood sugars and impact on wound healing Provide education on nutrition Treatment Activities: Education provided on Nutrition : 06/20/2019 Patient referred to Primary Care Physician for further nutritional evaluation : 05/09/2019 Notes: Wound/Skin Impairment Nursing Diagnoses: Knowledge deficit related to ulceration/compromised skin integrity Goals: Patient/caregiver will verbalize understanding of skin care regimen Date Initiated: 05/09/2019 Target Resolution Date: 08/30/2019 Goal Status: Active Interventions: Assess patient/caregiver  ability to perform ulcer/skin care regimen upon admission and as needed Assess ulceration(s) every visit Provide education on ulcer and skin care Treatment Activities: Skin care regimen initiated : 05/09/2019 Topical wound management initiated : 05/09/2019 Notes: Electronic Signature(s) Signed: 08/13/2019 6:18:44 PM By: Levan Hurst RN, BSN Entered By: Levan Hurst on 08/12/2019 09:19:34 -------------------------------------------------------------------------------- Pain Assessment Details Patient Name: Date of Service: Cory Palmer 08/12/2019 8:30 AM Medical Record [id number] Patient Account Number: [id number] Date of Birth/Sex: Treating RN: 1961/02/09 (59 y.o. Cory Palmer Primary Care Dwyane Dupree: PATIENT, NO Other Clinician: Referring Kasiyah Platter: Treating Braycen Burandt/Extender:Robson, [name] in Treatment: 13 Active Problems Location of Pain Severity and Description of Pain Patient Has Paino No Site Locations Pain Management and Medication Current Pain Management: Electronic Signature(s) Signed: 08/12/2019 5:57:16 PM By: Kela Millin Entered By: Kela Millin on 08/12/2019 08:58:23 -------------------------------------------------------------------------------- Patient/Caregiver Education Details Patient Name: Date of Service: Cory Palmer 1/11/2021andnbsp8:30 AM Medical Record [phone] Patient Account Number: [id number] Date of Birth/Gender: 22-Oct-1960 (58 y.o. M) Treating RN: Levan Hurst Primary Care Physician: PATIENT, NO Other Clinician: Referring Physician: Treating Physician/Extender:Robson, [name] in Treatment: 13 Education Assessment Education Provided To: Patient Education Topics Provided Wound/Skin Impairment: Methods: Explain/Verbal Responses: State content correctly Electronic Signature(s) Signed: 08/13/2019 6:18:44 PM By: Levan Hurst RN, BSN Entered By: Levan Hurst on 08/12/2019  09:19:55 -------------------------------------------------------------------------------- Wound Assessment Details Patient Name: Date of Service: Cory Palmer, Cory Palmer 08/12/2019 8:30 AM Medical Record [id number] Patient Account Number: [id number] Date of Birth/Sex: Treating RN: [birth year] (59 y.o. Gay Filler, Larene Beach  Primary Care Goldia Ligman: PATIENT, NO Other Clinician: Referring Lasharn Bufkin: Treating Hannia Matchett/Extender:Robson, [name] in Treatment: 13 Wound Status Wound Number: 1 Primary Diabetic Wound/Ulcer of the Lower Etiology: Extremity Wound Location: Left Foot - Plantar Wound Status: Open Wounding Event: Gradually Appeared Comorbid Hypertension, Type II Diabetes, Date Acquired: 05/04/2018 History: Osteomyelitis Weeks Of Treatment: 13 Clustered Wound: No Photos Wound Measurements Length: (cm) 4.8 % Reduction in Ar Width: (cm) 3.8 % Reduction in Vo Depth: (cm) 0.2 Epithelialization Area: (cm) 14.326 Tunneling: Volume: (cm) 2.865 Undermining: Starting Posit Ending Positio Maximum Distan ea: -17.8% lume: -17.8% : None No Yes ion (o'clock): 5 n (o'clock): 7 ce: (cm) 0.9 Wound Description Classification: Grade 3 Foul Odor After C Wound Margin: Thickened Slough/Fibrino Exudate Amount: Medium Exudate Type: Purulent Exudate Color: yellow, brown, green Wound Bed Granulation Amount: Large (67-100%) Granulation Quality: Pink, Pale Fascia Exposed: Necrotic Amount: Small (1-33%) Fat Layer (Subcut Necrotic Quality: Adherent Slough Tendon Exposed: Muscle Exposed: Joint Exposed: Bone Expo leansing: No Yes Exposed Structure No aneous Tissue) Exposed: Yes No No No sed: No Treatment Notes Wound #1 (Left, Plantar Foot) 3. Primary Dressing Applied Hydrofera Blue 4. Secondary Dressing Dry Gauze Roll Gauze Notes foam donut as secondary. Electronic Signature(s) Signed: 08/15/2019 3:33:56 PM By: Mikeal Hawthorne EMT/HBOT Signed: 08/16/2019 5:57:08 PM By:  Kela Millin Previous Signature: 08/12/2019 5:57:16 PM Version By: Kela Millin Entered By: Mikeal Hawthorne on 08/15/2019 13:54:52 -------------------------------------------------------------------------------- Vitals Details Patient Name: Date of Service: Cory Hashimoto B. 08/12/2019 8:30 AM Medical Record [id number] Patient Account Number: [id number] Date of Birth/Sex: Treating RN: 05-01-1961 (59 y.o. Cory Palmer Primary Care Fallan Mccarey: PATIENT, NO Other Clinician: Referring Kadeja Granada: Treating Ashantia Amaral/Extender:Robson, [name] in Treatment: 13 Vital Signs Time Taken: 08:55 Temperature (F): 97.8 Height (in): 76 Pulse (bpm): 68 Weight (lbs): 240 Respiratory Rate (breaths/min): 19 Body Mass Index (BMI): 29.2 Blood Pressure (mmHg): 105/66 Capillary Blood Glucose (mg/dl): 136 Reference Range: 80 - 120 mg / dl Notes patient stated last CBG was 136 Electronic Signature(s) Signed: 08/12/2019 5:57:16 PM By: Kela Millin Entered By: Kela Millin on 08/12/2019 08:58:11

## 2019-08-15 ENCOUNTER — Encounter: Payer: Self-pay | Admitting: Internal Medicine

## 2019-08-15 ENCOUNTER — Other Ambulatory Visit: Payer: Self-pay

## 2019-08-15 ENCOUNTER — Ambulatory Visit (INDEPENDENT_AMBULATORY_CARE_PROVIDER_SITE_OTHER): Payer: Medicare Other | Admitting: Internal Medicine

## 2019-08-15 VITALS — BP 142/80 | HR 65 | Temp 98.1°F | Resp 14 | Ht 76.0 in | Wt 259.0 lb

## 2019-08-15 DIAGNOSIS — M86172 Other acute osteomyelitis, left ankle and foot: Secondary | ICD-10-CM

## 2019-08-15 DIAGNOSIS — Z5181 Encounter for therapeutic drug level monitoring: Secondary | ICD-10-CM | POA: Diagnosis not present

## 2019-08-15 DIAGNOSIS — M86161 Other acute osteomyelitis, right tibia and fibula: Secondary | ICD-10-CM

## 2019-08-15 DIAGNOSIS — L97521 Non-pressure chronic ulcer of other part of left foot limited to breakdown of skin: Secondary | ICD-10-CM

## 2019-08-15 DIAGNOSIS — Z89432 Acquired absence of left foot: Secondary | ICD-10-CM

## 2019-08-15 NOTE — Assessment & Plan Note (Signed)
Will check the CMP today.   

## 2019-08-15 NOTE — Assessment & Plan Note (Deleted)
Wound doing well, closing and will continue to monitor

## 2019-08-15 NOTE — Assessment & Plan Note (Signed)
Wound doing well, closing and will continue to monitor 

## 2019-08-15 NOTE — Assessment & Plan Note (Signed)
This continues to heal well and improving with the doxycycline.  No indication to change and will check inflammatory markers again today and follow up in 3-4 weeks and consider stopping at that time if it is doing well.

## 2019-08-15 NOTE — Progress Notes (Signed)
   Subjective:    Patient ID: VANE YAPP, male    DOB: 12/20/1960, 59 y.o.   MRN: [id number]  HPI Here for follow up of osteomyelitis. I saw him 2 weeks ago for evaluation and he had been on doxycycline.  Previous wound swab with Streptococcus but history of MSSA bacteremia.   Continues to have improvement of his wound with good granulation tissue.  Saw Dr. [name].  Continues on doxycycline.    Review of Systems  Constitutional: Negative for chills and fever.  Gastrointestinal: Negative for diarrhea and nausea.  Skin: Negative for rash.       Objective:   Physical Exam Pulmonary:     Effort: Pulmonary effort is normal.  Musculoskeletal:     Comments: Ulcer with good granulation tissue, no erythema, no discharge.    Neurological:     Mental Status: He is alert.   SH: + tobacco        Assessment & Plan:

## 2019-08-16 LAB — COMPREHENSIVE METABOLIC PANEL
AG Ratio: 1.3 (calc) (ref 1.0–2.5)
ALT: 13 U/L (ref 9–46)
AST: 22 U/L (ref 10–35)
Albumin: 3.9 g/dL (ref 3.6–5.1)
Alkaline phosphatase (APISO): 120 U/L (ref 35–144)
BUN: 12 mg/dL (ref 7–25)
CO2: 22 mmol/L (ref 20–32)
Calcium: 9.1 mg/dL (ref 8.6–10.3)
Chloride: 106 mmol/L (ref 98–110)
Creat: 0.78 mg/dL (ref 0.70–1.33)
Globulin: 3.1 g/dL (calc) (ref 1.9–3.7)
Glucose, Bld: 125 mg/dL — ABNORMAL HIGH (ref 65–99)
Potassium: 3.8 mmol/L (ref 3.5–5.3)
Sodium: 137 mmol/L (ref 135–146)
Total Bilirubin: 0.9 mg/dL (ref 0.2–1.2)
Total Protein: 7 g/dL (ref 6.1–8.1)

## 2019-08-16 LAB — C-REACTIVE PROTEIN: CRP: 15 mg/L — ABNORMAL HIGH (ref <8.0)

## 2019-08-16 LAB — SEDIMENTATION RATE: Sed Rate: 34 mm/h — ABNORMAL HIGH (ref 0–20)

## 2019-08-19 ENCOUNTER — Other Ambulatory Visit: Payer: Self-pay

## 2019-08-19 ENCOUNTER — Encounter (HOSPITAL_BASED_OUTPATIENT_CLINIC_OR_DEPARTMENT_OTHER): Payer: Medicare Other | Admitting: Internal Medicine

## 2019-08-19 DIAGNOSIS — E11621 Type 2 diabetes mellitus with foot ulcer: Secondary | ICD-10-CM | POA: Diagnosis not present

## 2019-08-19 NOTE — Progress Notes (Addendum)
Cory Palmer ([id number]) Visit Report for 08/19/2019 Debridement Details Patient Name: Date of Service: Cory Palmer 08/19/2019 8:30 AM Medical Record [id number] Patient Account Number: [id number] Date of Birth/Sex: Jun 27, 1961 (58 y.o. M) Treating RN: Primary Care Provider: PATIENT, NO Other Clinician: Referring Provider: Treating Provider/Extender:Mandel Seiden, [name] in Treatment: 14 Debridement Performed for Wound #1 Left,Plantar Foot Assessment: Performed By: Physician [name]., MD Debridement Type: Debridement Severity of Tissue Pre Fat layer exposed Debridement: Level of Consciousness (Pre- Awake and Alert procedure): Pre-procedure Verification/Time Out Taken: Yes - 09:27 Start Time: 09:27 Pain Control: Lidocaine 5% topical ointment Total Area Debrided (L x W): 4.4 (cm) x 3.8 (cm) = 16.72 (cm) Tissue and other material Viable, Non-Viable, Callus, Subcutaneous debrided: Level: Skin/Subcutaneous Tissue Debridement Description: Excisional Instrument: Curette Bleeding: Moderate Hemostasis Achieved: Silver Nitrate End Time: 09:29 Procedural Pain: 0 Post Procedural Pain: 0 Response to Treatment: Procedure was tolerated well Level of Consciousness Awake and Alert (Post-procedure): Post Debridement Measurements of Total Wound Length: (cm) 4.4 Width: (cm) 3.8 Depth: (cm) 0.2 Volume: (cm) 2.626 Character of Wound/Ulcer Post Improved Debridement: Severity of Tissue Post Debridement: Fat layer exposed Post Procedure Diagnosis Same as Pre-procedure Electronic Signature(s) Signed: 08/20/2019 4:08:35 PM By: [name] MD Entered By: [name] on 08/19/2019 09:33:56 -------------------------------------------------------------------------------- HPI Details Patient Name: Date of Service: Cory Palmer 08/19/2019 8:30 AM Medical Record [id number] Patient Account Number: [id number] Date of Birth/Sex: Treating RN: 10/14/1960 (59  y.o. M) Primary Care Provider: PATIENT, NO Other Clinician: Referring Provider: Treating Provider/Extender:Cinthya Bors, [name] in Treatment: 14 History of Present Illness HPI Description: ADMISSION 05/09/2019 This is a 59 year old man with type 2 diabetes. He has had a previous right BKA done in [place] 4 years ago. Looking back through [facility] this year he had a left fourth toe wound in January 2020. By looking at the pictures he had also had previous amputation of the first second and third toes as well as a right BKA. 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 Sharol Given on 09/28/2018. I believe at some point he dehisced the amputation as he had a wound VAC in early March. He was admitted to hospital from 10/04/2018 through 10/18/2018 with MSSA/strep sepsis. A TEE was negative. I do not believe during this admission he was felt to have osteomyelitis however an x-ray on 10/28/2018 showed concern for osteomyelitis at the remaining second third and fourth metatarsals. An x-ray on 12/19/2018 showed progressive new bone formation at the transmetatarsal amputation sites of the second through fifth digits consistent with either acute or chronic osteomyelitis. It is hard to tell although after the discharge in March it looks as though he had a course of vancomycin and may be oral antibiotics although I am not sure about this or the duration of antibiotic therapy. The patient states that the wound opened up several months ago. It was clear that there was a wound on the plantar left foot on 12/27/2018. He was in the ER on 9/20 with a large wound on the left plantar foot given Silvadene. The patient states he has been putting bizarre combinations of things on this wound including castor oil. Past medical history; congestive heart failure, type 2 diabetes, bipolar disorder, right BKA, remote history of substance abuse, atrial fibrillation Arterial studies done on the left  and 05/06/2018 showed an ABI of 1.21 with triphasic waveforms. 10/15; I did talk with Dr. Darron Doom and asked her to order a CRP and sedimentation rate. Based on the  lab work that she is doing inflammatory markers white count etc. we will order an MRI of the left foot [see discussion above]. She actually stated that she has not really been following him regularly so was not really aware of what had been done in March. His wound actually looks somewhat better today and slightly smaller in terms of surface area 10/22; surprisingly the patient's labs that I ordered with Dr. Dennie Fetters assistance were not that bad. White count was 4.9 differential count normal. Sedimentation rate was 23 comprehensive metabolic panel was normal. Hemoglobin A1c 5.9 I thought I saw the C-reactive protein but I cannot put my finger on this right at the moment. His wound actually looks somewhat better measures better. He did however have some fluorescent green drainage 11/5; the patient's wound is come in a centimeter in width. We have been using silver alginate he has been changing the dressing himself.. We do not have a way to aggressively offload this as he will has a right leg prosthesis. Nursing still reports green drainage. I gave him antibiotics for this 11/19; the patient's wound came in again this time however he is complaining of pain in the inferior part of the wound on the left plantar foot. This is where he is undermining area is I have obtained a culture for CandS and I am going to give him empiric antibiotics. He has a complicated wound history including a right BKA and a left transmetatarsal amputation. The wound is roughly on the third met head remanent 12/7; patient has not been here in 3 weeks. I think this is a transportation issue. I gave him doxycycline last time is here. The culture showed strep and he was changed to Augmentin in my absence. He he is completing this. X-ray however raised the suspicion of  osteomyelitis and he has an MRI booked for Wednesday i.e. in 2 days. We will use calcium alginate until then 12/14; patient comes in today with purulent drainage noted by her intake nurses. He is completing Augmentin that was given for him last time. His MRI was done on 12/9; this showed osteomyelitis of the remanent of the shaft of the second metatarsal. We have been using silver alginate 12/21; wound is on the plantar aspect of his foot. I do not see any evidence of current infection. His MRI done early this month showed osteomyelitis of the remanent of the shaft of the second metatarsal. He sees Dr. Sharol Given tomorrow. He has an infectious disease appointment on January 4. I am hopeful that we can make a decision about how to approach this. Hopefully with some foot preservation 12/28; wound is on the plantar aspect of his left foot. Previous right BKA. He did not see Dr. Sharol Given last week but has an appointment to see him next week. He did see the PA who noted that he does have an MRI that suggested osteomyelitis of the remanent of the shaft of the second metatarsal. I am interested in knowing whether Dr. Sharol Given feels a surgical approach is possible short of a BKA. He also has an appointment with infectious disease. I have told the patient that he may have a difficult decision to make. Were not making a lot of progress. This is a usual problem when you have a wound on the plantar with a BKA on the other side. There is really no way to offload this it is easy. 08/12/2019. Wound on the plantar aspect of his left foot with in the setting of a  previous right BKA. He saw both Dr. Novella Olive and infectious disease and Dr. Sharol Given. This is for osteomyelitis at the remanent of the shaft of the second metatarsal. Dr. Novella Olive makes a comment about him being back on doxycycline although I do not have this in my record but he felt doxycycline was appropriate and he is now on a month worth of this. Possible IV antibiotics.  He is going to have inflammatory markers repeated in 2 weeks. The patient also saw Dr. Sharol Given on 1/7. He felt his wound was improving. He did not comment on the osteomyelitis. It is impossible to adequately offload the left leg with the right BKA. He could not handle a total contact cast. Also notable for the fact he does not drive so he walks 2 blocks to the grocery store. 1/18; patient followed up with infectious disease. He continues on doxycycline which was extended for another month. I will try to see if there are inflammatory markers done. I do not see another way to offload this area and I went over this with him again. Electronic Signature(s) Signed: 08/20/2019 4:08:35 PM By: [name] MD Entered By: [name] on 08/19/2019 09:35:01 -------------------------------------------------------------------------------- Physical Exam Details Patient Name: Date of Service: LODEN, LAURENT 08/19/2019 8:30 AM Medical Record [id number] Patient Account Number: [id number] Date of Birth/Sex: Treating RN: 1960/12/05 (59 y.o. M) Primary Care Provider: PATIENT, NO Other Clinician: Referring Provider: Treating Provider/Extender:Trey Gulbranson, [name] in Treatment: 43 Constitutional Patient is hypertensive.. Pulse regular and within target range for patient.Marland Kitchen Respirations regular, non-labored and within target range.. Temperature is normal and within the target range for the patient.Marland Kitchen Appears in no distress. Notes Wound exam; slightly smaller measurements today. The surface looks quite good however a very fibrinous tactile debridement with a #5 curette. Hemostasis with silver nitrate. No surrounding erythema Electronic Signature(s) Signed: 08/20/2019 4:08:35 PM By: [name] MD Entered By: [name] on 08/19/2019 09:36:39 -------------------------------------------------------------------------------- Physician Orders Details Patient Name: Date of Service: JARID, SASSO 08/19/2019 8:30 AM Medical Record [id number] Patient Account Number: [id number] Date of Birth/Sex: Treating RN: Mar 25, 1961 (59 y.o. [name] Primary Care Provider: PATIENT, NO Other Clinician: Referring Provider: Treating Provider/Extender:Adith Tejada, [name] in Treatment: 50 Verbal / Phone Orders: No Diagnosis Coding ICD-10 Coding Code Description E11.621 Type 2 diabetes mellitus with foot ulcer E11.42 Type 2 diabetes mellitus with diabetic polyneuropathy Z89.511 Acquired absence of right leg below knee M86.672 Other chronic osteomyelitis, left ankle and foot Follow-up Appointments Return Appointment in 2 weeks. Dressing Change Frequency Wound #1 Left,Plantar Foot Change Dressing every other day. Skin Barriers/Peri-Wound Care Moisturizing lotion - to left leg daily. Wound Cleansing Wound #1 Left,Plantar Foot May shower and wash wound with soap and water. - with dressing changes Primary Wound Dressing Wound #1 Left,Plantar Foot Hydrofera Blue Secondary Dressing Wound #1 Left,Plantar Foot Kerlix/Rolled Gauze Dry Gauze ABD pad Edema Control Avoid standing for long periods of time Elevate legs to the level of the heart or above for 30 minutes daily and/or when sitting, a frequency of: - throughout the day. Additional Orders / Instructions Stop/Decrease Smoking Follow Nutritious Diet - increase protein and vegetables in diabetic diet. Electronic Signature(s) Signed: 08/19/2019 6:02:32 PM By: Levan Hurst RN, BSN Signed: 08/20/2019 4:08:35 PM By: [name] MD Entered By: Levan Hurst on 08/19/2019 09:34:24 -------------------------------------------------------------------------------- Problem List Details Patient Name: Date of Service: NIKOLA, MARONE 08/19/2019 8:30 AM Medical Record [id number] Patient Account Number: [id number] Date of Birth/Sex: Treating RN: Jun 29, 1961 (59 y.o. 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 M) Primary Care Provider: PATIENT, NO Other Clinician: Referring Provider: Treating Provider/Extender:Russell Engelstad, [name] in Treatment: 14 Subjective History of Present Illness (HPI) ADMISSION 05/09/2019 This is a 59 year old man with type 2 diabetes. He has had a previous right BKA done in [place] 4 years ago. Looking back through [facility] this year he had a left fourth toe wound in January 2020. By looking at the pictures he had also had previous amputation of the first second and third toes as well as a right BKA. 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 Sharol Given on 09/28/2018. I believe at some point he dehisced the amputation as he had a wound VAC in early March. He was admitted to hospital from 10/04/2018 through 10/18/2018 with MSSA/strep sepsis. A TEE was negative. I do not believe during this admission he was felt to have osteomyelitis however an x-ray on 10/28/2018 showed concern for osteomyelitis at the remaining  second third and fourth metatarsals. An x-ray on 12/19/2018 showed progressive new bone formation at the transmetatarsal amputation sites of the second through fifth digits consistent with either acute or chronic osteomyelitis. It is hard to tell although after the discharge in March it looks as though he had a course of vancomycin and may be oral antibiotics although I am not sure about this or the duration of antibiotic therapy. The patient states that the wound opened up several months ago. It was clear that there was a wound on the plantar left foot on 12/27/2018. He was in the ER on 9/20 with a large wound on the left plantar foot given Silvadene. The patient states he has been putting bizarre combinations of things on this wound including castor oil. Past medical history; congestive heart failure, type 2 diabetes, bipolar disorder, right BKA, remote history of substance abuse, atrial fibrillation Arterial studies done on the left and 05/06/2018 showed an ABI of 1.21 with triphasic waveforms. 10/15; I did talk with Dr. Darron Doom and asked her to order a CRP and sedimentation rate. Based on the lab work that she is doing inflammatory markers white count etc. we will order an MRI of the left foot [see discussion above]. She actually stated that she has not really been following him regularly so was not really aware of what had been done in March. His wound actually looks somewhat better today and slightly smaller in terms of surface area 10/22; surprisingly the patient's labs that I ordered with Dr. Dennie Fetters assistance were not that bad. White count was 4.9 differential count normal. Sedimentation rate was 23 comprehensive metabolic panel was normal. Hemoglobin A1c 5.9 I thought I saw the C-reactive protein but I cannot put my finger on this right at the moment. His wound actually looks somewhat better measures better. He did  however have some fluorescent green drainage 11/5; the patient's wound is  come in a centimeter in width. We have been using silver alginate he has been changing the dressing himself.. We do not have a way to aggressively offload this as he will has a right leg prosthesis. Nursing still reports green drainage. I gave him antibiotics for this 11/19; the patient's wound came in again this time however he is complaining of pain in the inferior part of the wound on the left plantar foot. This is where he is undermining area is I have obtained a culture for CandS and I am going to give him empiric antibiotics. He has a complicated wound history including a right BKA and a left transmetatarsal amputation. The wound is roughly on the third met head remanent 12/7; patient has not been here in 3 weeks. I think this is a transportation issue. I gave him doxycycline last time is here. The culture showed strep and he was changed to Augmentin in my absence. He he is completing this. X-ray however raised the suspicion of osteomyelitis and he has an MRI booked for Wednesday i.e. in 2 days. We will use calcium alginate until then 12/14; patient comes in today with purulent drainage noted by her intake nurses. He is completing Augmentin that was given for him last time. His MRI was done on 12/9; this showed osteomyelitis of the remanent of the shaft of the second metatarsal. We have been using silver alginate 12/21; wound is on the plantar aspect of his foot. I do not see any evidence of current infection. His MRI done early this month showed osteomyelitis of the remanent of the shaft of the second metatarsal. He sees Dr. Sharol Given tomorrow. He has an infectious disease appointment on January 4. I am hopeful that we can make a decision about how to approach this. Hopefully with some foot preservation 12/28; wound is on the plantar aspect of his left foot. Previous right BKA. He did not see Dr. Sharol Given last week but has an appointment to see him next week. He did see the PA who noted that he does  have an MRI that suggested osteomyelitis of the remanent of the shaft of the second metatarsal. I am interested in knowing whether Dr. Sharol Given feels a surgical approach is possible short of a BKA. He also has an appointment with infectious disease. I have told the patient that he may have a difficult decision to make. Were not making a lot of progress. This is a usual problem when you have a wound on the plantar with a BKA on the other side. There is really no way to offload this it is easy. 08/12/2019. Wound on the plantar aspect of his left foot with in the setting of a previous right BKA. He saw both Dr. Novella Olive and infectious disease and Dr. Sharol Given. This is for osteomyelitis at the remanent of the shaft of the second metatarsal. Dr. Novella Olive makes a comment about him being back on doxycycline although I do not have this in my record but he felt doxycycline was appropriate and he is now on a month worth of this. Possible IV antibiotics. He is going to have inflammatory markers repeated in 2 weeks. The patient also saw Dr. Sharol Given on 1/7. He felt his wound was improving. He did not comment on the osteomyelitis. It is impossible to adequately offload the left leg with the right BKA. He could not handle a total contact cast. Also notable for the fact  he does not drive so he walks 2 blocks to the grocery store. 1/18; patient followed up with infectious disease. He continues on doxycycline which was extended for another month. I will try to see if there are inflammatory markers done. I do not see another way to offload this area and I went over this with him again. Objective Constitutional Patient is hypertensive.. Pulse regular and within target range for patient.Marland Kitchen Respirations regular, non-labored and within target range.. Temperature is normal and within the target range for the patient.Marland Kitchen Appears in no distress. Vitals Time Taken: 8:49 AM, Height: 76 in, Source: Stated, Weight: 240 lbs, Source: Stated,  BMI: 29.2, Temperature: 98.2 F, Pulse: 126 bpm, Respiratory Rate: 18 breaths/min, Blood Pressure: 143/79 mmHg. General Notes: has not checked blood sugar for at least one week General Notes: Wound exam; slightly smaller measurements today. The surface looks quite good however a very fibrinous tactile debridement with a #5 curette. Hemostasis with silver nitrate. No surrounding erythema Integumentary (Hair, Skin) Wound #1 status is Open. Original cause of wound was Gradually Appeared. The wound is located on the [place]. The wound measures 4.4cm length x 3.8cm width x 0.2cm depth; 13.132cm^2 area and 2.626cm^3 volume. There is Fat Layer (Subcutaneous Tissue) Exposed exposed. There is no tunneling or undermining noted. There is a medium amount of serosanguineous drainage noted. The wound margin is thickened. There is large (67-100%) pink, pale granulation within the wound bed. There is a small (1-33%) amount of necrotic tissue within the wound bed including Adherent Slough. 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 There was a Excisional Skin/Subcutaneous Tissue Debridement with a total area of 16.72 sq cm performed by [name]., MD. With the following instrument(s): Curette to remove Viable and Non-Viable tissue/material. Material removed includes Callus and Subcutaneous Tissue and after achieving pain control using Lidocaine 5% topical ointment. No specimens were taken. A time out was conducted at 09:27, prior to the start of the procedure. A Moderate amount of  bleeding was controlled with Silver Nitrate. The procedure was tolerated well with a pain level of 0 throughout and a pain level of 0 following the procedure. Post Debridement Measurements: 4.4cm length x 3.8cm width x 0.2cm depth; 2.626cm^3 volume. Character of Wound/Ulcer Post Debridement is improved. Severity of Tissue Post Debridement is: Fat layer exposed. Post procedure Diagnosis Wound #1: Same as Pre-Procedure Plan Follow-up Appointments: Return Appointment in 2 weeks. Dressing Change Frequency: Wound #1 Left,Plantar Foot: Change Dressing every other day. Skin Barriers/Peri-Wound Care: Moisturizing lotion - to left leg daily. Wound Cleansing: Wound #1 Left,Plantar Foot: May shower and wash wound with soap and water. - with dressing changes Primary Wound Dressing: Wound #1 Left,Plantar Foot: Hydrofera Blue Secondary Dressing: Wound #1 Left,Plantar Foot: Kerlix/Rolled Gauze Dry Gauze ABD pad Edema Control: Avoid standing for long periods of time Elevate legs to the level of the heart or above for 30 minutes daily and/or when sitting, a frequency of: - throughout the day. Additional Orders / Instructions: Stop/Decrease Smoking Follow Nutritious Diet - increase protein and vegetables in diabetic diet. 1. I am continuing with Hydrofera Blue for now although with debridement today may  need to consider an alternative 2. He continues on doxycycline for the underlying osteomyelitis as directed by infectious disease 3. Really no alternative to offload and leave this man functional 4. Follow-up in 2 weeks at his request Electronic Signature(s) Signed: 08/20/2019 4:08:35 PM By: [name] MD Entered By: [name] on 08/19/2019 09:37:49 -------------------------------------------------------------------------------- SuperBill Details Patient Name: Date of Service: Cory Palmer 08/19/2019 Medical Record [id number] Patient Account Number: [id number] Date of  Birth/Sex: Treating RN: [birth year] (59 y.o. 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

## 2019-09-02 ENCOUNTER — Encounter (HOSPITAL_BASED_OUTPATIENT_CLINIC_OR_DEPARTMENT_OTHER): Payer: Medicare Other | Admitting: Internal Medicine

## 2019-09-03 ENCOUNTER — Other Ambulatory Visit: Payer: Self-pay

## 2019-09-03 ENCOUNTER — Encounter (HOSPITAL_BASED_OUTPATIENT_CLINIC_OR_DEPARTMENT_OTHER): Payer: Medicare Other | Admitting: Internal Medicine

## 2019-09-03 DIAGNOSIS — E1169 Type 2 diabetes mellitus with other specified complication: Secondary | ICD-10-CM | POA: Diagnosis not present

## 2019-09-03 DIAGNOSIS — E1142 Type 2 diabetes mellitus with diabetic polyneuropathy: Secondary | ICD-10-CM | POA: Insufficient documentation

## 2019-09-03 DIAGNOSIS — L97522 Non-pressure chronic ulcer of other part of left foot with fat layer exposed: Secondary | ICD-10-CM | POA: Diagnosis not present

## 2019-09-03 DIAGNOSIS — M86672 Other chronic osteomyelitis, left ankle and foot: Secondary | ICD-10-CM | POA: Diagnosis not present

## 2019-09-03 DIAGNOSIS — E11621 Type 2 diabetes mellitus with foot ulcer: Secondary | ICD-10-CM | POA: Diagnosis not present

## 2019-09-03 DIAGNOSIS — Z89511 Acquired absence of right leg below knee: Secondary | ICD-10-CM | POA: Diagnosis not present

## 2019-09-03 NOTE — Progress Notes (Signed)
RAVI, TUCCILLO ([id number]) Visit Report for 09/03/2019 Arrival Information Details Patient Name: Date of Service: Cory Palmer, Cory Palmer 09/03/2019 11:00 AM Medical Record [id number] Patient Account Number: [id number] Date of Birth/Sex: Treating RN: 07-09-1961 (59 y.o. [name] Primary Care Colonel Krauser: PATIENT, NO Other Clinician: Referring Pamela Maddy: Treating Chelcy Bolda/Extender:Robson, [name] in Treatment: 84 Visit Information History Since Last Visit Added or deleted any medications: No Patient Arrived: Ambulatory Any new allergies or adverse reactions: No Arrival Time: 11:20 Had a fall or experienced change in No Accompanied By: self activities of daily living that may affect Transfer Assistance: None risk of falls: Patient Identification Verified: Yes Signs or symptoms of abuse/neglect since last No Secondary Verification Process Completed: Yes visito Patient Requires Transmission-Based No Hospitalized since last visit: No Precautions: Implantable device outside of the clinic excluding No Patient Has Alerts: No cellular tissue based products placed in the center since last visit: Has Dressing in Place as Prescribed: Yes Pain Present Now: No Electronic Signature(s) Signed: 09/03/2019 5:43:30 PM By: Baruch Gouty RN, BSN Entered By: Baruch Gouty on 09/03/2019 11:22:14 -------------------------------------------------------------------------------- Encounter Discharge Information Details Patient Name: Date of Service: Cory Palmer 09/03/2019 11:00 AM Medical Record [id number] Patient Account Number: [id number] Date of Birth/Sex: Treating RN: 18-Mar-1961 (59 y.o. Marvis Repress Primary Care Ascencion Stegner: PATIENT, NO Other Clinician: Referring Kwynn Schlotter: Treating Fernando Stoiber/Extender:Robson, [name] in Treatment: 16 Encounter Discharge Information Items Post Procedure Vitals Discharge Condition: Stable Temperature (F): 98.1 Ambulatory Status:  Ambulatory Pulse (bpm): 63 Discharge Destination: Home Respiratory Rate (breaths/min): 20 Transportation: Other Blood Pressure (mmHg): 107/89 Accompanied By: self Schedule Follow-up Appointment: Yes Clinical Summary of Care: Patient Declined Electronic Signature(s) Signed: 09/03/2019 4:57:44 PM By: Kela Millin Entered By: Kela Millin on 09/03/2019 12:56:09 -------------------------------------------------------------------------------- Lower Extremity Assessment Details Patient Name: Date of Service: Cory Palmer, Cory Palmer 09/03/2019 11:00 AM Medical Record [id number] Patient Account Number: [id number] Date of Birth/Sex: Treating RN: 13-Nov-1960 (59 y.o. [name] Primary Care Hammond Obeirne: PATIENT, NO Other Clinician: Referring Ronel Rodeheaver: Treating Rochelle Nephew/Extender:Robson, [name] in Treatment: 16 Edema Assessment Assessed: [Left: No] [Right: No] Edema: [Left: Ye] [Right: s] Calf Left: Right: Point of Measurement: 43 cm From Medial Instep 40.5 cm cm Ankle Left: Right: Point of Measurement: 10 cm From Medial Instep 25 cm cm Vascular Assessment Pulses: Dorsalis Pedis Palpable: [Left:Yes] Electronic Signature(s) Signed: 09/03/2019 5:43:30 PM By: Baruch Gouty RN, BSN Entered By: Baruch Gouty on 09/03/2019 11:31:31 -------------------------------------------------------------------------------- Multi Wound Chart Details Patient Name: Date of Service: Cory Palmer 09/03/2019 11:00 AM Medical Record [id number] Patient Account Number: [id number] Date of Birth/Sex: Treating RN: [birth year] (58 y.o. Jerilynn Mages) Carlene Coria Primary Care Devontre Siedschlag: PATIENT, NO Other Clinician: Referring Ria Redcay: Treating Zaylia Riolo/Extender:Robson, [name] in Treatment: 16 Vital Signs Height(in): 76 Pulse(bpm): 31 Weight(lbs): 240 Blood Pressure(mmHg): 107/89 Body Mass Index(BMI): 29 Temperature(F): 98.1 Respiratory 20 Rate(breaths/min): Photos: [1:No Photos]  [2:No Photos] [N/A:N/A] Wound Location: [1:Left Foot - Plantar] [2:Right, Medial Knee] [N/A:N/A] Wounding Event: [1:Gradually Appeared] [2:Blister] [N/A:N/A] Primary Etiology: [1:Diabetic Wound/Ulcer of the Diabetic Wound/Ulcer of the N/A Lower Extremity] [2:Lower Extremity] Comorbid History: [1:Hypertension, Type II Diabetes, Osteomyelitis] [2:Hypertension, Type II Diabetes, Osteomyelitis] [N/A:N/A] Date Acquired: [1:05/04/2018] [2:08/30/2019] [N/A:N/A] Weeks of Treatment: [1:16] [2:0] [N/A:N/A] Wound Status: [1:Open] [2:Open] [N/A:N/A] Measurements L x W x D 4.9x3.6x0.2 [2:1.1x1.4x0.1] [N/A:N/A] (cm) Area (cm) : [1:13.854] [2:1.21] [N/A:N/A] Volume (cm) : [1:2.771] [2:0.121] [N/A:N/A] % Reduction in Area: [1:-13.90%] [2:0.00%] [N/A:N/A] % Reduction in Volume: -13.90% [2:0.00%] [N/A:N/A] Classification: [1:Grade 3] [2:Grade 1] [N/A:N/A] Exudate Amount: [1:Large] [2:Medium] [N/A:N/A] Exudate Type: [1:Serosanguineous] [2:Serous] [  N/A:N/A] Exudate Color: [1:red, brown] [2:amber] [N/A:N/A] Wound Margin: [1:Epibole] [2:Flat and Intact] [N/A:N/A] Granulation Amount: [1:Large (67-100%)] [2:Large (67-100%)] [N/A:N/A] Granulation Quality: [1:Pink, Pale] [2:Red] [N/A:N/A] Necrotic Amount: [1:Small (1-33%)] [2:None Present (0%)] [N/A:N/A] Exposed Structures: [1:Fat Layer (Subcutaneous Fat Layer (Subcutaneous N/A Tissue) Exposed: Yes Fascia: No Tendon: No Muscle: No Joint: No Bone: No] [2:Tissue) Exposed: Yes Fascia: No Tendon: No Muscle: No Joint: No Bone: No] Epithelialization: [1:None] [2:Small (1-33%)] [N/A:N/A] Debridement: [1:Debridement - Excisional N/A] [N/A:N/A] Pre-procedure [1:12:13] [2:N/A] [N/A:N/A] Verification/Time Out Taken: Pain Control: [1:Lidocaine 5% topical ointment] [2:N/A] [N/A:N/A] Tissue Debrided: [1:Subcutaneous, Slough] [2:N/A] [N/A:N/A] Level: [1:Skin/Subcutaneous Tissue N/A] [N/A:N/A] Debridement Area (sq cm):17.15 [2:N/A] [N/A:N/A] Instrument: [1:Curette] [2:N/A]  [N/A:N/A] Bleeding: [1:Moderate] [2:N/A] [N/A:N/A] Hemostasis Achieved: [1:Silver Nitrate] [2:N/A] [N/A:N/A] Procedural Pain: [1:0] [2:N/A] [N/A:N/A] Post Procedural Pain: [1:0] [2:N/A] [N/A:N/A] Debridement Treatment [1:Procedure was tolerated] [2:N/A] [N/A:N/A] Response: [1:well] Post Debridement [1:4.9x3.6x0.2] [2:N/A] [N/A:N/A] Measurements L x W x D (cm) Post Debridement [1:2.771] [2:N/A] [N/A:N/A] Volume: (cm) Procedures Performed: [1:Debridement] [2:N/A] [N/A:N/A] Treatment Notes Wound #1 (Left, Plantar Foot) 1. Cleanse With Wound Cleanser 3. Primary Dressing Applied Hydrofera Blue 4. Secondary Dressing Dry Gauze Roll Gauze Notes surgifoam applied to foot due to bleeding. Wound #2 (Right, Medial Knee) 1. Cleanse With Wound Cleanser 3. Primary Dressing Applied Hydrofera Blue 4. Secondary Dressing Dry Gauze Roll Gauze Notes surgifoam applied to foot due to bleeding. Electronic Signature(s) Signed: 09/03/2019 5:22:39 PM By: Baltazar Najjar MD Signed: 09/03/2019 5:23:18 PM By: Yevonne Pax RN Entered By: Baltazar Najjar on 09/03/2019 13:09:47 -------------------------------------------------------------------------------- Multi-Disciplinary Care Plan Details Patient Name: Date of Service: Cory Palmer, Cory Palmer 09/03/2019 11:00 AM Medical Record [id number] Patient Account Number: [id number] Date of Birth/Sex: Treating RN: 1961-04-28 (58 y.o. Judie Petit) Yevonne Pax Primary Care Ahmyah Gidley: PATIENT, NO Other Clinician: Referring Arnisha Laffoon: Treating Meiah Zamudio/Extender:Robson, [name] in Treatment: 16 Active Inactive Wound/Skin Impairment Nursing Diagnoses: Knowledge deficit related to ulceration/compromised skin integrity Goals: Patient/caregiver will verbalize understanding of skin care regimen Date Initiated: 05/09/2019 Target Resolution Date: 09/27/2019 Goal Status: Active Interventions: Assess patient/caregiver ability to perform ulcer/skin care regimen upon  admission and as needed Assess ulceration(s) every visit Provide education on ulcer and skin care Treatment Activities: Skin care regimen initiated : 05/09/2019 Topical wound management initiated : 05/09/2019 Notes: Electronic Signature(s) Signed: 09/03/2019 5:23:18 PM By: Yevonne Pax RN Entered By: Yevonne Pax on 09/03/2019 11:21:08 -------------------------------------------------------------------------------- Pain Assessment Details Patient Name: Date of Service: HEMI, CHACKO 09/03/2019 11:00 AM Medical Record [id number] Patient Account Number: [id number] Date of Birth/Sex: Treating RN: [birth year] (59 y.o. Damaris Schooner Primary Care Caralyn Twining: PATIENT, NO Other Clinician: Referring Liliane Mallis: Treating Eiley Mcginnity/Extender:Robson, [name] in Treatment: 16 Active Problems Location of Pain Severity and Description of Pain Patient Has Paino Yes Site Locations Pain Location: Pain in Ulcers With Dressing Change: No Duration of the Pain. Constant / Intermittento Constant Rate the pain. Current Pain Level: 6 Worst Pain Level: 6 Least Pain Level: 0 Character of Pain Describe the Pain: Other: sore Pain Management and Medication Current Pain Management: Rest: Yes Other: tolerable Is the Current Pain Management Adequate: Adequate How does your wound impact your activities of daily livingo Sleep: No Bathing: No Appetite: No Relationship With Others: No Bladder Continence: No Emotions: No Bowel Continence: No Work: No Toileting: No Drive: No Dressing: No Hobbies: No Electronic Signature(s) Signed: 09/03/2019 5:43:30 PM By: Zenaida Deed RN, BSN Entered By: Zenaida Deed on 09/03/2019 11:24:52 -------------------------------------------------------------------------------- Patient/Caregiver Education Details Patient Name: Date of Service: Plamondon, Mansa B. 2/2/2021andnbsp11:00 AM Medical Record [phone] Patient Account Number:  [id number] Date of  Birth/Gender: 1960-08-24 (59 y.o. M) Treating RN: Yevonne Pax Primary Care Physician: PATIENT, NO Other Clinician: Referring Physician: Treating Physician/Extender:Robson, [name] in Treatment: 16 Education Assessment Education Provided To: Patient Education Topics Provided Wound/Skin Impairment: Methods: Explain/Verbal Responses: State content correctly Electronic Signature(s) Signed: 09/03/2019 5:23:18 PM By: Yevonne Pax RN Entered By: Yevonne Pax on 09/03/2019 11:21:22 -------------------------------------------------------------------------------- Wound Assessment Details Patient Name: Date of Service: Cory Palmer, Cory Palmer 09/03/2019 11:00 AM Medical Record [id number] Patient Account Number: [id number] Date of Birth/Sex: Treating RN: August 07, 1960 (58 y.o. Judie Petit) Yevonne Pax Primary Care Elyse Prevo: PATIENT, NO Other Clinician: Referring Enedelia Martorelli: Treating Julieanna Geraci/Extender:Robson, [name] in Treatment: 16 Wound Status Wound Number: 1 Primary Diabetic Wound/Ulcer of the Lower Etiology: Extremity Wound Location: Left Foot - Plantar Wound Status: Open Wounding Event: Gradually Appeared Comorbid Hypertension, Type II Diabetes, Date Acquired: 05/04/2018 History: Osteomyelitis Weeks Of Treatment: 16 Clustered Wound: No Photos Wound Measurements Length: (cm) 4.9 % Reduction in Ar Width: (cm) 3.6 % Reduction in Vo Depth: (cm) 0.2 Epithelialization Area: (cm) 13.854 Tunneling: Volume: (cm) 2.771 Undermining: Wound Description Classification: Grade 3 Foul Odor After C Wound Margin: Epibole Slough/Fibrino Exudate Amount: Large Exudate Type: Serosanguineous Exudate Color: red, brown Wound Bed Granulation Amount: Large (67-100%) Granulation Quality: Pink, Pale Fascia Exposed: Necrotic Amount: Small (1-33%) Fat Layer (Subcut Necrotic Quality: Adherent Slough Tendon Exposed: Muscle Exposed: Joint Exposed: Bone Exposed: leansing: No Yes Exposed  Structure No aneous Tissue) Exposed: Yes No No No No ea: -13.9% lume: -13.9% : None No No Treatment Notes Wound #1 (Left, Plantar Foot) 1. Cleanse With Wound Cleanser 3. Primary Dressing Applied Hydrofera Blue 4. Secondary Dressing Dry Gauze Roll Gauze Notes surgifoam applied to foot due to bleeding. Electronic Signature(s) Signed: 09/03/2019 5:19:10 PM By: Benjaman Kindler EMT/HBOT Signed: 09/03/2019 5:23:18 PM By: Yevonne Pax RN Entered By: Benjaman Kindler on 09/03/2019 14:29:03 -------------------------------------------------------------------------------- Wound Assessment Details Patient Name: Date of Service: Cory Palmer, Cory Palmer 09/03/2019 11:00 AM Medical Record [id number] Patient Account Number: [id number] Date of Birth/Sex: Treating RN: 1960-12-26 (59 y.o. Damaris Schooner Primary Care Kwabena Strutz: PATIENT, NO Other Clinician: Referring Kalkidan Caudell: Treating Toa Mia/Extender:Robson, [name] in Treatment: 16 Wound Status Wound Number: 2 Primary Diabetic Wound/Ulcer of the Lower Etiology: Extremity Wound Location: Right Knee - Medial Wound Status: Open Wounding Event: Blister Comorbid Hypertension, Type II Diabetes, Date Acquired: 08/30/2019 History: Osteomyelitis Weeks Of Treatment: 0 Clustered Wound: No Photos Wound Measurements Length: (cm) 1.1 % Reduct Width: (cm) 1.4 % Reduct Depth: (cm) 0.1 Epitheli Area: (cm) 1.21 Tunneli Volume: (cm) 0.121 Undermi Wound Description Classification: Grade 1 Foul Odo Wound Margin: Flat and Intact Slough/F Exudate Amount: Medium Exudate Type: Serous Exudate Color: amber Wound Bed Granulation Amount: Large (67-100%) Granulation Quality: Red Fascia Necrotic Amount: None Present (0%) Fat Lay Tendon Muscle Joint E Bone Ex r After Cleansing: No ibrino No Exposed Structure Exposed: No er (Subcutaneous Tissue) Exposed: Yes Exposed: No Exposed: No xposed: No posed: No ion in Area: 0% ion in Volume:  0% alization: Small (1-33%) ng: No ning: No Treatment Notes Wound #2 (Right, Medial Knee) 1. Cleanse With Wound Cleanser 3. Primary Dressing Applied Hydrofera Blue 4. Secondary Dressing Dry Gauze Roll Gauze Notes surgifoam applied to foot due to bleeding. Electronic Signature(s) Signed: 09/03/2019 5:19:10 PM By: Benjaman Kindler EMT/HBOT Signed: 09/03/2019 5:43:30 PM By: Zenaida Deed RN, BSN Entered By: Benjaman Kindler on 09/03/2019 14:27:02 -------------------------------------------------------------------------------- Vitals Details Patient Name: Date of Service: LENNIN, Cory Palmer 09/03/2019 11:00 AM Medical Record [id number] Patient Account Number: [id number] Date  of Birth/Sex: Treating RN: [birth year] (59 y.o. Damaris Schooner Primary Care Swetha Rayle: PATIENT, NO Other Clinician: Referring [place] Flett: Treating Mazi Schuff/Extender:Robson, [name] in Treatment: 16 Vital Signs Time Taken: 11:22 Temperature (F): 98.1 Height (in): 76 Pulse (bpm): 63 Source: Stated Respiratory Rate (breaths/min): 20 Weight (lbs): 240 Blood Pressure (mmHg): 107/89 Source: Stated Reference Range: 80 - 120 mg / dl Body Mass Index (BMI): 29.2 Notes pt does not check blood sugars on a regular basis Electronic Signature(s) Signed: 09/03/2019 5:43:30 PM By: Zenaida Deed RN, BSN Entered By: Zenaida Deed on 09/03/2019 11:25:06

## 2019-09-03 NOTE — Progress Notes (Signed)
Cory Palmer, Cory Palmer ([id number]) Visit Report for 09/03/2019 Debridement Details Patient Name: Date of Service: Cory Palmer, Cory Palmer 09/03/2019 11:00 AM Medical Record [id number] Patient Account Number: [id number] Date of Birth/Sex: 08-Jul-1961 (58 y.o. M) Treating RN: Carlene Coria Primary Care Provider: PATIENT, NO Other Clinician: Referring Provider: Treating Provider/Extender:Eero Dini, [name] in Treatment: 16 Debridement Performed for Wound #1 Left,Plantar Foot Assessment: Performed By: Physician [name]., MD Debridement Type: Debridement Severity of Tissue Pre Fat layer exposed Debridement: Level of Consciousness (Pre- Awake and Alert procedure): Pre-procedure Verification/Time Out Taken: Yes - 12:13 Start Time: 12:13 Pain Control: Lidocaine 5% topical ointment Total Area Debrided (L x W): 4.9 (cm) x 3.5 (cm) = 17.15 (cm) Tissue and other material Viable, Non-Viable, Slough, Subcutaneous, Skin: Dermis , Skin: Epidermis, Slough debrided: Level: Skin/Subcutaneous Tissue Debridement Description: Excisional Instrument: Curette Bleeding: Moderate Hemostasis Achieved: Silver Nitrate End Time: 12:17 Procedural Pain: 0 Post Procedural Pain: 0 Response to Treatment: Procedure was tolerated well Level of Consciousness Awake and Alert (Post-procedure): Post Debridement Measurements of Total Wound Length: (cm) 4.9 Width: (cm) 3.6 Depth: (cm) 0.2 Volume: (cm) 2.771 Character of Wound/Ulcer Post Improved Debridement: Severity of Tissue Post Debridement: Fat layer exposed Post Procedure Diagnosis Same as Pre-procedure Electronic Signature(s) Signed: 09/03/2019 5:22:39 PM By: [name] MD Signed: 09/03/2019 5:23:18 PM By: Carlene Coria RN Entered By: [name] on 09/03/2019 13:09:59 -------------------------------------------------------------------------------- HPI Details Patient Name: Date of Service: Cory Palmer 09/03/2019 11:00 AM Medical Record  [id number] Patient Account Number: [id number] Date of Birth/Sex: Treating RN: 01/27/1961 (58 y.o. Oval Linsey Primary Care Provider: PATIENT, NO Other Clinician: Referring Provider: Treating Provider/Extender:Bryson Palen, [name] in Treatment: 16 History of Present Illness HPI Description: ADMISSION 05/09/2019 This is a 59 year old man with type 2 diabetes. He has had a previous right BKA done in [place] 4 years ago. Looking back through [facility] this year he had a left fourth toe wound in January 2020. By looking at the pictures he had also had previous amputation of the first second and third toes as well as a right BKA. 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 Sharol Given on 09/28/2018. I believe at some point he dehisced the amputation as he had a wound VAC in early March. He was admitted to hospital from 10/04/2018 through 10/18/2018 with MSSA/strep sepsis. A TEE was negative. I do not believe during this admission he was felt to have osteomyelitis however an x-ray on 10/28/2018 showed concern for osteomyelitis at the remaining second third and fourth metatarsals. An x-ray on 12/19/2018 showed progressive new bone formation at the transmetatarsal amputation sites of the second through fifth digits consistent with either acute or chronic osteomyelitis. It is hard to tell although after the discharge in March it looks as though he had a course of vancomycin and may be oral antibiotics although I am not sure about this or the duration of antibiotic therapy. The patient states that the wound opened up several months ago. It was clear that there was a wound on the plantar left foot on 12/27/2018. He was in the ER on 9/20 with a large wound on the left plantar foot given Silvadene. The patient states he has been putting bizarre combinations of things on this wound including castor oil. Past medical history; congestive heart failure, type 2 diabetes, bipolar  disorder, right BKA, remote history of substance abuse, atrial fibrillation Arterial studies done on the left and 05/06/2018 showed an ABI of 1.21 with triphasic waveforms. 10/15; I  did talk with Dr. Darron Doom and asked her to order a CRP and sedimentation rate. Based on the lab work that she is doing inflammatory markers white count etc. we will order an MRI of the left foot [see discussion above]. She actually stated that she has not really been following him regularly so was not really aware of what had been done in March. His wound actually looks somewhat better today and slightly smaller in terms of surface area 10/22; surprisingly the patient's labs that I ordered with Dr. Dennie Fetters assistance were not that bad. White count was 4.9 differential count normal. Sedimentation rate was 23 comprehensive metabolic panel was normal. Hemoglobin A1c 5.9 I thought I saw the C-reactive protein but I cannot put my finger on this right at the moment. His wound actually looks somewhat better measures better. He did however have some fluorescent green drainage 11/5; the patient's wound is come in a centimeter in width. We have been using silver alginate he has been changing the dressing himself.. We do not have a way to aggressively offload this as he will has a right leg prosthesis. Nursing still reports green drainage. I gave him antibiotics for this 11/19; the patient's wound came in again this time however he is complaining of pain in the inferior part of the wound on the left plantar foot. This is where he is undermining area is I have obtained a culture for CandS and I am going to give him empiric antibiotics. He has a complicated wound history including a right BKA and a left transmetatarsal amputation. The wound is roughly on the third met head remanent 12/7; patient has not been here in 3 weeks. I think this is a transportation issue. I gave him doxycycline last time is here. The culture showed strep  and he was changed to Augmentin in my absence. He he is completing this. X-ray however raised the suspicion of osteomyelitis and he has an MRI booked for Wednesday i.e. in 2 days. We will use calcium alginate until then 12/14; patient comes in today with purulent drainage noted by her intake nurses. He is completing Augmentin that was given for him last time. His MRI was done on 12/9; this showed osteomyelitis of the remanent of the shaft of the second metatarsal. We have been using silver alginate 12/21; wound is on the plantar aspect of his foot. I do not see any evidence of current infection. His MRI done early this month showed osteomyelitis of the remanent of the shaft of the second metatarsal. He sees Dr. Sharol Given tomorrow. He has an infectious disease appointment on January 4. I am hopeful that we can make a decision about how to approach this. Hopefully with some foot preservation 12/28; wound is on the plantar aspect of his left foot. Previous right BKA. He did not see Dr. Sharol Given last week but has an appointment to see him next week. He did see the PA who noted that he does have an MRI that suggested osteomyelitis of the remanent of the shaft of the second metatarsal. I am interested in knowing whether Dr. Sharol Given feels a surgical approach is possible short of a BKA. He also has an appointment with infectious disease. I have told the patient that he may have a difficult decision to make. Were not making a lot of progress. This is a usual problem when you have a wound on the plantar with a BKA on the other side. There is really no way to offload this it  is easy. 08/12/2019. Wound on the plantar aspect of his left foot with in the setting of a previous right BKA. He saw both Dr. Novella Olive and infectious disease and Dr. Sharol Given. This is for osteomyelitis at the remanent of the shaft of the second metatarsal. Dr. Novella Olive makes a comment about him being back on doxycycline although I do not have this in  my record but he felt doxycycline was appropriate and he is now on a month worth of this. Possible IV antibiotics. He is going to have inflammatory markers repeated in 2 weeks. The patient also saw Dr. Sharol Given on 1/7. He felt his wound was improving. He did not comment on the osteomyelitis. It is impossible to adequately offload the left leg with the right BKA. He could not handle a total contact cast. Also notable for the fact he does not drive so he walks 2 blocks to the grocery store. 1/18; patient followed up with infectious disease. He continues on doxycycline which was extended for another month. I will try to see if there are inflammatory markers done. I do not see another way to offload this area and I went over this with him again. 2/2; patient is still on doxycycline. Saw Dr. Novella Olive on 1/14 he sees Dr. Sharol Given this week. The area on the left foot is certainly no better at all nonviable surface thick around callused edges. He has a new area on the medial part of his stump just above the incision line. This is superficial but again is another an indicator of how much she is on this leg. He tells [place] he walks to the grocery store which is 6 blocks and back and to doctor's appointments. Electronic Signature(s) Signed: 09/03/2019 5:22:39 PM By: [name] MD Entered By: [name] on 09/03/2019 13:11:01 -------------------------------------------------------------------------------- Physical Exam Details Patient Name: Date of Service: Cory Palmer, Cory Palmer 09/03/2019 11:00 AM Medical Record [id number] Patient Account Number: [id number] Date of Birth/Sex: Treating RN: 1960/10/01 (58 y.o. Jerilynn Mages) Carlene Coria Primary Care Provider: PATIENT, NO Other Clinician: Referring Provider: Treating Provider/Extender:Myndi Wamble, [name] in Treatment: 16 Constitutional Sitting or standing Blood Pressure is within target range for patient.. Pulse regular and within target range for patient.Marland Kitchen  Respirations regular, non-labored and within target range.. Temperature is normal and within the target range for the patient.Marland Kitchen Appears in no distress. Notes Wound exam; there is absolutely no change in this. Completely nonviable surface thick rolled senescent edges. I attempted a debridement with a #5 curette with a very fibrinous nonviable surface. Hemostasis with silver nitrate and direct pressure as well as Gelfoam. I do not know that I will attempt this again. The area on the right medial stump is above the margin very superficial Electronic Signature(s) Signed: 09/03/2019 5:22:39 PM By: [name] MD Entered By: [name] on 09/03/2019 13:12:08 -------------------------------------------------------------------------------- Physician Orders Details Patient Name: Date of Service: KERY, BATZEL 09/03/2019 11:00 AM Medical Record [id number] Patient Account Number: [id number] Date of Birth/Sex: Treating RN: [birth year] (58 y.o. Jerilynn Mages) Carlene Coria Primary Care Provider: PATIENT, NO Other Clinician: Referring Provider: Treating Provider/Extender:Keishawna Carranza, [name] in Treatment: 100 Verbal / Phone Orders: No Diagnosis Coding ICD-10 Coding Code Description E11.621 Type 2 diabetes mellitus with foot ulcer E11.42 Type 2 diabetes mellitus with diabetic polyneuropathy Z89.511 Acquired absence of right leg below knee M86.672 Other chronic osteomyelitis, left ankle and foot Follow-up Appointments Return Appointment in 2 weeks. Dressing Change Frequency Wound #1 Left,Plantar Foot Change Dressing every other day. Skin Barriers/Peri-Wound Care Moisturizing lotion - to  left leg daily. Wound Cleansing Wound #1 Left,Plantar Foot May shower and wash wound with soap and water. - with dressing changes Primary Wound Dressing Wound #1 Left,Plantar Foot Hydrofera Blue Wound #2 Right,Medial Knee Hydrofera Blue Secondary Dressing Wound #1 Left,Plantar Foot Kerlix/Rolled  Gauze Dry Gauze ABD pad Wound #2 Right,Medial Knee Foam Border Edema Control Avoid standing for long periods of time Elevate legs to the level of the heart or above for 30 minutes daily and/or when sitting, a frequency of: - throughout the day. Additional Orders / Instructions Stop/Decrease Smoking Follow Nutritious Diet - increase protein and vegetables in diabetic diet. Electronic Signature(s) Signed: 09/03/2019 5:22:39 PM By: [name] MD Signed: 09/03/2019 5:23:18 PM By: Carlene Coria RN Entered By: Carlene Coria on 09/03/2019 12:12:54 -------------------------------------------------------------------------------- Problem List Details Patient Name: Date of Service: Cory Palmer, Cory Palmer 09/03/2019 11:00 AM Medical Record [id number] Patient Account Number: [id number] Date of Birth/Sex: Treating RN: 1960/08/30 (58 y.o. Jerilynn Mages) Dolores Lory, [place] Primary Care Provider: PATIENT, NO Other Clinician: Referring Provider: Treating Provider/Extender:Jakoby Melendrez, [name] in Treatment: 16 Active Problems ICD-10 Evaluated Encounter Code Description Active Date Today Diagnosis E11.621 Type 2 diabetes mellitus with foot ulcer 05/09/2019 No Yes L97.522 Non-pressure chronic ulcer of other part of left foot 09/03/2019 No Yes with fat layer exposed E11.42 Type 2 diabetes mellitus with diabetic polyneuropathy 05/09/2019 No Yes Z89.511 Acquired absence of right leg below knee 05/09/2019 No Yes M86.672 Other chronic osteomyelitis, left ankle and foot 05/09/2019 No Yes L97.811 Non-pressure chronic ulcer of other part of right lower 09/03/2019 No Yes leg limited to breakdown of skin Inactive Problems Resolved Problems Electronic Signature(s) Signed: 09/03/2019 5:22:39 PM By: [name] MD Entered By: [name] on 09/03/2019 13:09:30 -------------------------------------------------------------------------------- Progress Note Details Patient Name: Date of Service: Cory Palmer 09/03/2019 11:00  AM Medical Record [id number] Patient Account Number: [id number] Date of Birth/Sex: Treating RN: November 18, 1960 (58 y.o. Jerilynn Mages) Carlene Coria Primary Care Provider: PATIENT, NO Other Clinician: Referring Provider: Treating Provider/Extender:Demetria Iwai, [name] in Treatment: 16 Subjective History of Present Illness (HPI) ADMISSION 05/09/2019 This is a 59 year old man with type 2 diabetes. He has had a previous right BKA done in [place] 4 years ago. Looking back through [facility] this year he had a left fourth toe wound in January 2020. By looking at the pictures he had also had previous amputation of the first second and third toes as well as a right BKA. 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 Sharol Given on 09/28/2018. I believe at some point he dehisced the amputation as he had a wound VAC in early March. He was admitted to hospital from 10/04/2018 through 10/18/2018 with MSSA/strep sepsis. A TEE was negative. I do not believe during this admission he was felt to have osteomyelitis however an x-ray on 10/28/2018 showed concern for osteomyelitis at the remaining second third and fourth metatarsals. An x-ray on 12/19/2018 showed progressive new bone formation at the transmetatarsal amputation sites of the second through fifth digits consistent with either acute or chronic osteomyelitis. It is hard to tell although after the discharge in March it looks as though he had a course of vancomycin and may be oral antibiotics although I am not sure about this or the duration of antibiotic therapy. The patient states that the wound opened up several months ago. It was clear that there was a wound on the plantar left foot on 12/27/2018. He was in the ER on 9/20 with a large wound on the left plantar foot  given Silvadene. The patient states he has been putting bizarre combinations of things on this wound including castor oil. Past medical history; congestive heart failure, type  2 diabetes, bipolar disorder, right BKA, remote history of substance abuse, atrial fibrillation Arterial studies done on the left and 05/06/2018 showed an ABI of 1.21 with triphasic waveforms. 10/15; I did talk with Dr. Darron Doom and asked her to order a CRP and sedimentation rate. Based on the lab work that she is doing inflammatory markers white count etc. we will order an MRI of the left foot [see discussion above]. She actually stated that she has not really been following him regularly so was not really aware of what had been done in March. His wound actually looks somewhat better today and slightly smaller in terms of surface area 10/22; surprisingly the patient's labs that I ordered with Dr. Dennie Fetters assistance were not that bad. White count was 4.9 differential count normal. Sedimentation rate was 23 comprehensive metabolic panel was normal. Hemoglobin A1c 5.9 I thought I saw the C-reactive protein but I cannot put my finger on this right at the moment. His wound actually looks somewhat better measures better. He did however have some fluorescent green drainage 11/5; the patient's wound is come in a centimeter in width. We have been using silver alginate he has been changing the dressing himself.. We do not have a way to aggressively offload this as he will has a right leg prosthesis. Nursing still reports green drainage. I gave him antibiotics for this 11/19; the patient's wound came in again this time however he is complaining of pain in the inferior part of the wound on the left plantar foot. This is where he is undermining area is I have obtained a culture for CandS and I am going to give him empiric antibiotics. He has a complicated wound history including a right BKA and a left transmetatarsal amputation. The wound is roughly on the third met head remanent 12/7; patient has not been here in 3 weeks. I think this is a transportation issue. I gave him doxycycline last time is here. The  culture showed strep and he was changed to Augmentin in my absence. He he is completing this. X-ray however raised the suspicion of osteomyelitis and he has an MRI booked for Wednesday i.e. in 2 days. We will use calcium alginate until then 12/14; patient comes in today with purulent drainage noted by her intake nurses. He is completing Augmentin that was given for him last time. His MRI was done on 12/9; this showed osteomyelitis of the remanent of the shaft of the second metatarsal. We have been using silver alginate 12/21; wound is on the plantar aspect of his foot. I do not see any evidence of current infection. His MRI done early this month showed osteomyelitis of the remanent of the shaft of the second metatarsal. He sees Dr. Sharol Given tomorrow. He has an infectious disease appointment on January 4. I am hopeful that we can make a decision about how to approach this. Hopefully with some foot preservation 12/28; wound is on the plantar aspect of his left foot. Previous right BKA. He did not see Dr. Sharol Given last week but has an appointment to see him next week. He did see the PA who noted that he does have an MRI that suggested osteomyelitis of the remanent of the shaft of the second metatarsal. I am interested in knowing whether Dr. Sharol Given feels a surgical approach is possible short of a BKA.  He also has an appointment with infectious disease. I have told the patient that he may have a difficult decision to make. Were not making a lot of progress. This is a usual problem when you have a wound on the plantar with a BKA on the other side. There is really no way to offload this it is easy. 08/12/2019. Wound on the plantar aspect of his left foot with in the setting of a previous right BKA. He saw both Dr. Novella Olive and infectious disease and Dr. Sharol Given. This is for osteomyelitis at the remanent of the shaft of the second metatarsal. Dr. Novella Olive makes a comment about him being back on doxycycline although I do  not have this in my record but he felt doxycycline was appropriate and he is now on a month worth of this. Possible IV antibiotics. He is going to have inflammatory markers repeated in 2 weeks. The patient also saw Dr. Sharol Given on 1/7. He felt his wound was improving. He did not comment on the osteomyelitis. It is impossible to adequately offload the left leg with the right BKA. He could not handle a total contact cast. Also notable for the fact he does not drive so he walks 2 blocks to the grocery store. 1/18; patient followed up with infectious disease. He continues on doxycycline which was extended for another month. I will try to see if there are inflammatory markers done. I do not see another way to offload this area and I went over this with him again. 2/2; patient is still on doxycycline. Saw Dr. Novella Olive on 1/14 he sees Dr. Sharol Given this week. The area on the left foot is certainly no better at all nonviable surface thick around callused edges. He has a new area on the medial part of his stump just above the incision line. This is superficial but again is another an indicator of how much she is on this leg. He tells [place] he walks to the grocery store which is 6 blocks and back and to doctor's appointments. Objective Constitutional Sitting or standing Blood Pressure is within target range for patient.. Pulse regular and within target range for patient.Marland Kitchen Respirations regular, non-labored and within target range.. Temperature is normal and within the target range for the patient.Marland Kitchen Appears in no distress. Vitals Time Taken: 11:22 AM, Height: 76 in, Source: Stated, Weight: 240 lbs, Source: Stated, BMI: 29.2, Temperature: 98.1 F, Pulse: 63 bpm, Respiratory Rate: 20 breaths/min, Blood Pressure: 107/89 mmHg. General Notes: pt does not check blood sugars on a regular basis General Notes: Wound exam; there is absolutely no change in this. Completely nonviable surface thick rolled senescent edges. I  attempted a debridement with a #5 curette with a very fibrinous nonviable surface. Hemostasis with silver nitrate and direct pressure as well as Gelfoam. I do not know that I will attempt this again. ooThe area on the right medial stump is above the margin very superficial Integumentary (Hair, Skin) Wound #1 status is Open. Original cause of wound was Gradually Appeared. The wound is located on the [place]. The wound measures 4.9cm length x 3.6cm width x 0.2cm depth; 13.854cm^2 area and 2.771cm^3 volume. There is Fat Layer (Subcutaneous Tissue) Exposed exposed. There is no tunneling or undermining noted. There is a large amount of serosanguineous drainage noted. The wound margin is epibole. There is large (67-100%) pink, pale granulation within the wound bed. There is a small (1-33%) amount of necrotic tissue within the wound bed including Adherent Slough. Wound #2 status  is Open. Original cause of wound was Blister. The wound is located on the Right,Medial Knee. The wound measures 1.1cm length x 1.4cm width x 0.1cm depth; 1.21cm^2 area and 0.121cm^3 volume. There is Fat Layer (Subcutaneous Tissue) Exposed exposed. There is no tunneling or undermining noted. There is a medium amount of serous drainage noted. The wound margin is flat and intact. There is large (67-100%) red granulation within the wound bed. There is no necrotic tissue within the wound bed. 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 There was a Excisional Skin/Subcutaneous Tissue Debridement with a total area of 17.15 sq cm performed by [name]., MD. With the following instrument(s): Curette to remove Viable and Non-Viable tissue/material. Material removed includes Subcutaneous Tissue, Slough, Skin: Dermis, and Skin: Epidermis after achieving pain control using Lidocaine 5% topical ointment. No specimens were taken. A time out was conducted at 12:13, prior to the start of the procedure. A Moderate amount of bleeding was controlled with Silver Nitrate. The procedure was tolerated well with a pain level of 0 throughout and a pain level of 0 following the procedure. Post Debridement Measurements: 4.9cm length x 3.6cm width x 0.2cm depth; 2.771cm^3 volume. Character of Wound/Ulcer Post Debridement is improved. Severity of Tissue Post Debridement is: Fat layer exposed. Post procedure Diagnosis Wound #1: Same as Pre-Procedure Plan Follow-up Appointments: Return Appointment in 2 weeks. Dressing Change Frequency: Wound #1 Left,Plantar Foot: Change Dressing every other day. Skin Barriers/Peri-Wound Care: Moisturizing lotion - to left leg daily. Wound Cleansing: Wound #1 Left,Plantar Foot: May shower and wash wound with soap and water. - with dressing changes Primary Wound Dressing: Wound #1 Left,Plantar Foot: Hydrofera Blue Wound #2 Right,Medial Knee: Hydrofera Blue Secondary Dressing: Wound #1 Left,Plantar Foot: Kerlix/Rolled Gauze Dry Gauze ABD pad Wound #2 Right,Medial Knee: Foam Border Edema Control: Avoid standing for long periods of time Elevate legs to the level of the heart or above for 30 minutes daily and/or when sitting, a frequency of: - throughout the day. Additional Orders / Instructions: Stop/Decrease Smoking Follow Nutritious Diet - increase protein and vegetables in diabetic diet. 1. Hydrofera Blue the left foot 2. I went over this with  him. This is not going to heal independent of the osteomyelitis without more aggressive offloading. Unfortunately he walks with a prosthesis on the other side. A total contact cast is certainly not an option. We talked about other forms of offloading including crutches, wheelchairs etc. We could look into whether he would be eligible for an electric wheelchair based on his use of it to go outside in the community rather than just go around the house. He has functional alarms but I do not know that he could use a standard wheelchair to get where this man needs to go. He does not have a car and he lives alone. 3. He is under treatment for osteomyelitis in the left foot but even independent of this I am doubtful that this is going to heal. There is simply is no offloading during his  day. 4. He has a new area on the right stump whether he will not use the prosthesis for walking is going to dictate whether this will heal or worsen Electronic Signature(s) Signed: 09/03/2019 5:22:39 PM By: [name] MD Entered By: [name] on 09/03/2019 13:14:03 -------------------------------------------------------------------------------- SuperBill Details Patient Name: Date of Service: Cory Palmer 09/03/2019 Medical Record [id number] Patient Account Number: [id number] Date of Birth/Sex: Treating RN: 26-Mar-1961 (58 y.o. 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, left ankle and foot L97.811 Non-pressure chronic ulcer of other part of right lower leg  limited to breakdown of skin Facility Procedures CPT4 Code Description: 49753005 11042 - DEB SUBQ TISSUE 20 SQ CM/< ICD-10 Diagnosis Description L97.522 Non-pressure chronic ulcer of other part of left foot with Modifier: fat layer expo Quantity: 1 sed Physician Procedures CPT4 Code Description: 1102111 73567 - WC PHYS SUBQ TISS 20 SQ CM ICD-10 Diagnosis Description L97.522 Non-pressure chronic ulcer of other part of left foot with Modifier: fat layer exposed Quantity: 1 Electronic Signature(s) Signed: 09/03/2019 5:22:39 PM By: [name] MD Entered By: [name] on 09/03/2019 13:14:16

## 2019-09-05 ENCOUNTER — Ambulatory Visit: Payer: Medicare Other | Admitting: Orthopedic Surgery

## 2019-09-16 ENCOUNTER — Other Ambulatory Visit: Payer: Self-pay

## 2019-09-17 ENCOUNTER — Encounter (HOSPITAL_BASED_OUTPATIENT_CLINIC_OR_DEPARTMENT_OTHER): Payer: Medicare Other | Attending: Internal Medicine | Admitting: Internal Medicine

## 2019-09-17 ENCOUNTER — Ambulatory Visit: Payer: Medicare Other | Admitting: Internal Medicine

## 2019-09-26 NOTE — Progress Notes (Signed)
[place], FRIEDEN ([id number]) Visit Report for 08/19/2019 Arrival Information Details Patient Name: Date of Service: Cory Palmer, Cory Palmer 08/19/2019 8:30 AM Medical Record [id number] Patient Account Number: [id number] Date of Birth/Sex: Treating RN: 1960/08/06 (59 y.o. [name] Primary Care Antwuan Eckley: PATIENT, NO Other Clinician: Referring Seann Genther: Treating Ilya Neely/Extender:Robson, [name] in Treatment: 28 Visit Information History Since Last Visit Cane All ordered tests and consults were completed: Yes Patient Arrived: 08:46 Added or deleted any medications: No Arrival Time: Any new allergies or adverse reactions: No Accompanied By: self None Had a fall or experienced change in No Transfer Assistance: activities of daily living that may affect Patient Requires Transmission-Based No risk of falls: Precautions: Signs or symptoms of abuse/neglect since last No Patient Has Alerts: No visito Hospitalized since last visit: No Implantable device outside of the clinic excluding No cellular tissue based products placed in the center since last visit: Has Dressing in Place as Prescribed: Yes Pain Present Now: No Electronic Signature(s) Signed: 08/19/2019 5:35:37 PM By: Baruch Gouty RN, BSN Entered By: Baruch Gouty on 08/19/2019 08:49:55 -------------------------------------------------------------------------------- Complex / Palliative Patient Assessment Details Patient Name: Date of Service: Cory Palmer 08/19/2019 8:30 AM Medical Record [id number] Patient Account Number: [id number] Date of Birth/Sex: Treating RN: 1961/04/14 (59 y.o. [name] Primary Care Leeandra Ellerson: PATIENT, NO Other Clinician: Referring Caydence Enck: Treating Icelynn Onken/Extender:Robson, [name] in Treatment: 14 Palliative Management Criteria Complex Wound Management Criteria Patient has remarkable or complex co-morbidities requiring medications or treatments that extend  wound healing times. Examples: Diabetes mellitus with chronic renal failure or end stage renal disease requiring dialysis Advanced or poorly controlled rheumatoid arthritis Diabetes mellitus and end stage chronic obstructive pulmonary disease Active cancer with current chemo- or radiation therapy Type 2 Diabetes, Osteomyelitis Care Approach Wound Care Plan: Complex Wound Management Electronic Signature(s) Signed: 08/20/2019 4:08:35 PM By: [name] MD Signed: 09/26/2019 2:24:55 PM By: Levan Hurst RN, BSN Entered By: Levan Hurst on 08/20/2019 11:57:02 -------------------------------------------------------------------------------- Encounter Discharge Information Details Patient Name: Date of Service: Cory Palmer, Cory Palmer 08/19/2019 8:30 AM Medical Record [id number] Patient Account Number: [id number] Date of Birth/Sex: Treating RN: 02/19/1961 (59 y.o. [name] Primary Care Echo Allsbrook: PATIENT, NO Other Clinician: Referring Everlina Gotts: Treating Taralyn Ferraiolo/Extender:Robson, [name] in Treatment: 14 Encounter Discharge Information Items Post Procedure Vitals Discharge Condition: Stable Temperature (F): 98.2 Ambulatory Status: Cane Pulse (bpm): 126 Discharge Destination: Home Respiratory Rate (breaths/min): 20 Transportation: Private Auto Blood Pressure (mmHg): 143/79 Accompanied By: self Schedule Follow-up Appointment: Yes Clinical Summary of Care: Electronic Signature(s) Signed: 08/19/2019 5:45:39 PM By: Deon Pilling Entered By: Deon Pilling on 08/19/2019 09:50:51 -------------------------------------------------------------------------------- Lower Extremity Assessment Details Patient Name: Date of Service: Cory Palmer, Cory Palmer 08/19/2019 8:30 AM Medical Record [id number] Patient Account Number: [id number] Date of Birth/Sex: Treating RN: [birth year] (59 y.o. [name] Primary Care Kagen Kunath: PATIENT, NO Other Clinician: Referring Marcanthony Sleight: Treating  Sephira Zellman/Extender:Robson, [name] in Treatment: 14 Edema Assessment Assessed: [Left: No] [Right: No] Edema: [Left: Ye] [Right: s] Calf Left: Right: Point of Measurement: 43 cm From Medial Instep 42.8 cm cm Ankle Left: Right: Point of Measurement: 10 cm From Medial Instep 26.8 cm cm Vascular Assessment Pulses: Dorsalis Pedis Palpable: [Left:Yes] Electronic Signature(s) Signed: 08/19/2019 5:35:37 PM By: Baruch Gouty RN, BSN Entered By: Baruch Gouty on 08/19/2019 08:58:28 -------------------------------------------------------------------------------- Multi Wound Chart Details Patient Name: Date of Service: Cory Palmer 08/19/2019 8:30 AM Medical Record [id number] Patient Account Number: [id number] Date of Birth/Sex: Treating RN: [birth year] (59 y.o. M) Primary Care Vadim Centola: PATIENT, NO Other Clinician: Referring  Marnie Fazzino: Treating Alaijah Gibler/Extender:Robson, [name] in Treatment: 14 Vital Signs Height(in): 76 Pulse(bpm): 126 Weight(lbs): 240 Blood Pressure(mmHg): 143/79 Body Mass Index(BMI): 29 Temperature(F): 98.2 Respiratory 18 Rate(breaths/min): Photos: [1:No Photos] [N/A:N/A] Wound Location: [1:Left Foot - Plantar] [N/A:N/A] Wounding Event: [1:Gradually Appeared] [N/A:N/A] Primary Etiology: [1:Diabetic Wound/Ulcer of the N/A Lower Extremity] Comorbid History: [1:Hypertension, Type II Diabetes, Osteomyelitis] [N/A:N/A] Date Acquired: [1:05/04/2018] [N/A:N/A] Weeks of Treatment: [1:14] [N/A:N/A] Wound Status: [1:Open] [N/A:N/A] Measurements L x W x D [1:4.4x3.8x0.2] [N/A:N/A] (cm) Area (cm) : [1:13.132] [N/A:N/A] Volume (cm) : [1:2.626] [N/A:N/A] % Reduction in Area: [1:-8.00%] [N/A:N/A] % Reduction in Volume: [1:-8.00%] [N/A:N/A] Classification: [1:Grade 3] [N/A:N/A] Exudate Amount: [1:Medium] [N/A:N/A] Exudate Type: [1:Serosanguineous] [N/A:N/A] Exudate Color: [1:red, brown] [N/A:N/A] Wound Margin: [1:Thickened]  [N/A:N/A] Granulation Amount: [1:Large (67-100%)] [N/A:N/A] Granulation Quality: [1:Pink, Pale] [N/A:N/A] Necrotic Amount: [1:Small (1-33%)] [N/A:N/A] Exposed Structures: [1:Fat Layer (Subcutaneous Tissue) Exposed: Yes Fascia: No Tendon: No Muscle: No Joint: No Bone: No] [N/A:N/A] Epithelialization: [1:None] [N/A:N/A] Debridement: [1:Debridement - Excisional] [N/A:N/A] Pre-procedure [1:09:27] [N/A:N/A] Verification/Time Out Taken: Pain Control: [1:Lidocaine 5% topical ointment] [N/A:N/A] Tissue Debrided: [1:Callus, Subcutaneous] [N/A:N/A] Level: [1:Skin/Subcutaneous Tissue] [N/A:N/A] Debridement Area (sq cm):16.72 [N/A:N/A] Instrument: [1:Curette] [N/A:N/A] Bleeding: [1:Moderate] [N/A:N/A] Hemostasis Achieved: [1:Silver Nitrate] [N/A:N/A] Procedural Pain: [1:0] [N/A:N/A] Post Procedural Pain: [1:0] [N/A:N/A] Debridement Treatment Procedure was tolerated [N/A:N/A] Response: [1:well] Post Debridement [1:4.4x3.8x0.2] [N/A:N/A] Measurements L x W x D (cm) Post Debridement [1:2.626] [N/A:N/A] Volume: (cm) Assessment Notes: [1:macerated wound edges] [N/A:N/A N/A] Treatment Notes Electronic Signature(s) Signed: 08/20/2019 4:08:35 PM By: [name] MD Entered By: [name] on 08/19/2019 09:32:35 -------------------------------------------------------------------------------- Multi-Disciplinary Care Plan Details Patient Name: Date of Service: Cory Palmer. 08/19/2019 8:30 AM Medical Record [id number] Patient Account Number: [id number] Date of Birth/Sex: Treating RN: Apr 05, 1961 (59 y.o. [name] Primary Care Collis Thede: PATIENT, NO Other Clinician: Referring Hiliary Osorto: Treating Craig Ionescu/Extender:Robson, [name] in Treatment: 14 Active Inactive Nutrition Nursing Diagnoses: Impaired glucose control: actual or potential Potential for alteratiion in Nutrition/Potential for imbalanced nutrition Goals: Patient/caregiver agrees to and verbalizes  understanding of need to obtain nutritional consultation Date Inactivated: 07/08/2019 Target11/27/2020 Resolution Date Initiated: 05/09/2019 Date: Goal Status: Met Patient/caregiver will maintain therapeutic glucose control Date Initiated: 05/09/2019 Target Resolution Date: 08/30/2019 Goal Status: Active Interventions: Assess HgA1c results as ordered upon admission and as needed Provide education on elevated blood sugars and impact on wound healing Provide education on nutrition Treatment Activities: Education provided on Nutrition : 07/08/2019 Patient referred to Primary Care Physician for further nutritional evaluation : 05/09/2019 Notes: Wound/Skin Impairment Nursing Diagnoses: Knowledge deficit related to ulceration/compromised skin integrity Goals: Patient/caregiver will verbalize understanding of skin care regimen Date Initiated: 05/09/2019 Target Resolution Date: 08/30/2019 Goal Status: Active Interventions: Assess patient/caregiver ability to perform ulcer/skin care regimen upon admission and as needed Assess ulceration(s) every visit Provide education on ulcer and skin care Treatment Activities: Skin care regimen initiated : 05/09/2019 Topical wound management initiated : 05/09/2019 Notes: Electronic Signature(s) Signed: 08/19/2019 6:02:32 PM By: Levan Hurst RN, BSN Entered By: Levan Hurst on 08/19/2019 10:50:53 -------------------------------------------------------------------------------- Pain Assessment Details Patient Name: Date of Service: Cory Palmer 08/19/2019 8:30 AM Medical Record [id number] Patient Account Number: [id number] Date of Birth/Sex: Treating RN: June 13, 1961 (59 y.o. [name] Primary Care Teagan Heidrick: PATIENT, NO Other Clinician: Referring Lorn Butcher: Treating Rayli Wiederhold/Extender:Robson, [name] in Treatment: 14 Active Problems Location of Pain Severity and Description of Pain Patient Has Paino Yes Site Locations Rate the  pain. Current Pain Level: 2 Character of Pain Describe the Pain: Tender, Other: sore Pain Management and Medication Current  Pain Management: Other: tolerable Is the Current Pain Management Adequate: Adequate How does your wound impact your activities of daily livingo Sleep: No Bathing: No Appetite: No Relationship With Others: No Bladder Continence: No Emotions: No Bowel Continence: No Work: No Toileting: No Drive: No Dressing: No Hobbies: No Electronic Signature(s) Signed: 08/19/2019 5:35:37 PM By: Baruch Gouty RN, BSN Entered By: Baruch Gouty on 08/19/2019 08:53:01 -------------------------------------------------------------------------------- Patient/Caregiver Education Details Patient Name: Date of Service: Cory Palmer 1/18/2021andnbsp8:30 AM Medical Record [id number] Patient Account Number: [id number] Date of Birth/Gender: Nov 11, 1960 (58 y.o. M) Treating RN: Levan Hurst Primary Care Physician: PATIENT, NO Other Clinician: Referring Physician: Treating Physician/Extender:Robson, [name] in Treatment: 14 Education Assessment Education Provided To: Patient Education Topics Provided Wound/Skin Impairment: Methods: Explain/Verbal Responses: State content correctly Electronic Signature(s) Signed: 08/19/2019 6:02:32 PM By: Levan Hurst RN, BSN Entered By: Levan Hurst on 08/19/2019 10:51:20 -------------------------------------------------------------------------------- Wound Assessment Details Patient Name: Date of Service: Cory Palmer, Cory Palmer 08/19/2019 8:30 AM Medical Record [id number] Patient Account Number: [id number] Date of Birth/Sex: Treating RN: 13-Jul-1961 (59 y.o. [name] Primary Care Tameria Patti: PATIENT, NO Other Clinician: Referring Sadira Standard: Treating Fynlee Rowlands/Extender:Robson, [name] in Treatment: 14 Wound Status Wound Number: 1 Primary Diabetic Wound/Ulcer of the Lower Etiology: Extremity Wound  Location: Left Foot - Plantar Wound Status: Open Wounding Event: Gradually Appeared Comorbid Hypertension, Type II Diabetes, Date Acquired: 05/04/2018 History: Osteomyelitis Weeks Of Treatment: 14 Clustered Wound: No Photos Wound Measurements Length: (cm) 4.4 % Reductio Width: (cm) 3.8 % Reductio Depth: (cm) 0.2 Epithelial Area: (cm) 13.132 Tunneling Volume: (cm) 2.626 Undermini Wound Description Classification: Grade 3 Wound Margin: Thickened Exudate Amount: Medium Exudate Type: Serosanguineous Exudate Color: red, brown Wound Bed Granulation Amount: Large (67-100%) Granulation Quality: Pink, Pale Necrotic Amount: Small (1-33%) Necrotic Quality: Adherent Slough Foul Odor After Cleansing: No Slough/Fibrino Yes Exposed Structure Fascia Exposed: No Fat Layer (Subcutaneous Tissue) Exposed: Yes Tendon Exposed: No Muscle Exposed: No Joint Exposed: No Bone Exposed: No n in Area: -8% n in Volume: -8% ization: None : No ng: No Assessment Notes macerated wound edges Electronic Signature(s) Signed: 08/23/2019 4:58:06 PM By: Baruch Gouty RN, BSN Signed: 08/23/2019 5:14:05 PM By: Mikeal Hawthorne EMT/HBOT Previous Signature: 08/19/2019 5:35:37 PM Version By: Baruch Gouty RN, BSN Entered By: Mikeal Hawthorne on 08/23/2019 15:28:39 -------------------------------------------------------------------------------- Vitals Details Patient Name: Date of Service: Cory Hashimoto B. 08/19/2019 8:30 AM Medical Record [id number] Patient Account Number: [id number] Date of Birth/Sex: Treating RN: 1960/08/31 (59 y.o. [name] Primary Care Kailana Benninger: PATIENT, NO Other Clinician: Referring Bisma Klett: Treating Mandel Seiden/Extender:Robson, [name] in Treatment: 14 Vital Signs Time Taken: 08:49 Temperature (F): 98.2 Height (in): 76 Pulse (bpm): 126 Source: Stated Respiratory Rate (breaths/min): 18 Weight (lbs): 240 Blood Pressure (mmHg): 143/79 Source:  Stated Reference Range: 80 - 120 mg / dl Body Mass Index (BMI): 29.2 Notes has not checked blood sugar for at least one week Electronic Signature(s) Signed: 08/19/2019 5:35:37 PM By: Baruch Gouty RN, BSN Entered By: Baruch Gouty on 08/19/2019 08:51:36

## 2019-10-19 ENCOUNTER — Emergency Department (HOSPITAL_COMMUNITY): Payer: Medicare Other

## 2019-10-19 ENCOUNTER — Inpatient Hospital Stay (HOSPITAL_COMMUNITY)
Admission: EM | Admit: 2019-10-19 | Discharge: 2019-10-21 | DRG: 637 | Discharge status: Against Medical Advice | Payer: Medicare Other | Attending: Family Medicine | Admitting: Family Medicine

## 2019-10-19 ENCOUNTER — Encounter (HOSPITAL_COMMUNITY): Payer: Self-pay | Admitting: Emergency Medicine

## 2019-10-19 ENCOUNTER — Other Ambulatory Visit: Payer: Self-pay

## 2019-10-19 DIAGNOSIS — M79609 Pain in unspecified limb: Secondary | ICD-10-CM

## 2019-10-19 DIAGNOSIS — D638 Anemia in other chronic diseases classified elsewhere: Secondary | ICD-10-CM | POA: Diagnosis present

## 2019-10-19 DIAGNOSIS — Z89511 Acquired absence of right leg below knee: Secondary | ICD-10-CM

## 2019-10-19 DIAGNOSIS — L538 Other specified erythematous conditions: Secondary | ICD-10-CM | POA: Diagnosis not present

## 2019-10-19 DIAGNOSIS — N400 Enlarged prostate without lower urinary tract symptoms: Secondary | ICD-10-CM | POA: Diagnosis present

## 2019-10-19 DIAGNOSIS — I480 Paroxysmal atrial fibrillation: Secondary | ICD-10-CM | POA: Diagnosis present

## 2019-10-19 DIAGNOSIS — E11621 Type 2 diabetes mellitus with foot ulcer: Secondary | ICD-10-CM | POA: Diagnosis present

## 2019-10-19 DIAGNOSIS — E1142 Type 2 diabetes mellitus with diabetic polyneuropathy: Secondary | ICD-10-CM | POA: Diagnosis present

## 2019-10-19 DIAGNOSIS — T383X6A Underdosing of insulin and oral hypoglycemic [antidiabetic] drugs, initial encounter: Secondary | ICD-10-CM | POA: Diagnosis present

## 2019-10-19 DIAGNOSIS — I10 Essential (primary) hypertension: Secondary | ICD-10-CM | POA: Diagnosis present

## 2019-10-19 DIAGNOSIS — Z89422 Acquired absence of other left toe(s): Secondary | ICD-10-CM | POA: Diagnosis not present

## 2019-10-19 DIAGNOSIS — F319 Bipolar disorder, unspecified: Secondary | ICD-10-CM | POA: Diagnosis present

## 2019-10-19 DIAGNOSIS — E119 Type 2 diabetes mellitus without complications: Secondary | ICD-10-CM | POA: Diagnosis not present

## 2019-10-19 DIAGNOSIS — I340 Nonrheumatic mitral (valve) insufficiency: Secondary | ICD-10-CM | POA: Diagnosis not present

## 2019-10-19 DIAGNOSIS — E1169 Type 2 diabetes mellitus with other specified complication: Secondary | ICD-10-CM | POA: Diagnosis not present

## 2019-10-19 DIAGNOSIS — L03116 Cellulitis of left lower limb: Secondary | ICD-10-CM | POA: Diagnosis present

## 2019-10-19 DIAGNOSIS — E872 Acidosis, unspecified: Secondary | ICD-10-CM | POA: Diagnosis present

## 2019-10-19 DIAGNOSIS — A419 Sepsis, unspecified organism: Secondary | ICD-10-CM

## 2019-10-19 DIAGNOSIS — B9561 Methicillin susceptible Staphylococcus aureus infection as the cause of diseases classified elsewhere: Secondary | ICD-10-CM | POA: Diagnosis present

## 2019-10-19 DIAGNOSIS — I059 Rheumatic mitral valve disease, unspecified: Secondary | ICD-10-CM | POA: Diagnosis not present

## 2019-10-19 DIAGNOSIS — K7460 Unspecified cirrhosis of liver: Secondary | ICD-10-CM | POA: Diagnosis present

## 2019-10-19 DIAGNOSIS — M869 Osteomyelitis, unspecified: Secondary | ICD-10-CM

## 2019-10-19 DIAGNOSIS — M7989 Other specified soft tissue disorders: Secondary | ICD-10-CM

## 2019-10-19 DIAGNOSIS — Z79899 Other long term (current) drug therapy: Secondary | ICD-10-CM

## 2019-10-19 DIAGNOSIS — Z833 Family history of diabetes mellitus: Secondary | ICD-10-CM

## 2019-10-19 DIAGNOSIS — F17210 Nicotine dependence, cigarettes, uncomplicated: Secondary | ICD-10-CM | POA: Diagnosis present

## 2019-10-19 DIAGNOSIS — L97529 Non-pressure chronic ulcer of other part of left foot with unspecified severity: Secondary | ICD-10-CM | POA: Diagnosis present

## 2019-10-19 DIAGNOSIS — E669 Obesity, unspecified: Secondary | ICD-10-CM | POA: Diagnosis present

## 2019-10-19 DIAGNOSIS — R110 Nausea: Secondary | ICD-10-CM

## 2019-10-19 DIAGNOSIS — Z7984 Long term (current) use of oral hypoglycemic drugs: Secondary | ICD-10-CM

## 2019-10-19 DIAGNOSIS — F419 Anxiety disorder, unspecified: Secondary | ICD-10-CM | POA: Diagnosis present

## 2019-10-19 DIAGNOSIS — B9562 Methicillin resistant Staphylococcus aureus infection as the cause of diseases classified elsewhere: Secondary | ICD-10-CM | POA: Diagnosis present

## 2019-10-19 DIAGNOSIS — M86272 Subacute osteomyelitis, left ankle and foot: Secondary | ICD-10-CM | POA: Diagnosis not present

## 2019-10-19 DIAGNOSIS — Z89432 Acquired absence of left foot: Secondary | ICD-10-CM

## 2019-10-19 DIAGNOSIS — Y92009 Unspecified place in unspecified non-institutional (private) residence as the place of occurrence of the external cause: Secondary | ICD-10-CM

## 2019-10-19 DIAGNOSIS — Z20822 Contact with and (suspected) exposure to covid-19: Secondary | ICD-10-CM | POA: Diagnosis present

## 2019-10-19 DIAGNOSIS — E876 Hypokalemia: Secondary | ICD-10-CM | POA: Diagnosis present

## 2019-10-19 DIAGNOSIS — Z91128 Patient's intentional underdosing of medication regimen for other reason: Secondary | ICD-10-CM

## 2019-10-19 DIAGNOSIS — I4892 Unspecified atrial flutter: Secondary | ICD-10-CM | POA: Diagnosis present

## 2019-10-19 DIAGNOSIS — R9431 Abnormal electrocardiogram [ECG] [EKG]: Secondary | ICD-10-CM

## 2019-10-19 DIAGNOSIS — E871 Hypo-osmolality and hyponatremia: Secondary | ICD-10-CM | POA: Diagnosis present

## 2019-10-19 DIAGNOSIS — R7881 Bacteremia: Secondary | ICD-10-CM | POA: Diagnosis present

## 2019-10-19 DIAGNOSIS — Z8249 Family history of ischemic heart disease and other diseases of the circulatory system: Secondary | ICD-10-CM

## 2019-10-19 DIAGNOSIS — Z6830 Body mass index (BMI) 30.0-30.9, adult: Secondary | ICD-10-CM

## 2019-10-19 DIAGNOSIS — Z8619 Personal history of other infectious and parasitic diseases: Secondary | ICD-10-CM

## 2019-10-19 DIAGNOSIS — E43 Unspecified severe protein-calorie malnutrition: Secondary | ICD-10-CM | POA: Diagnosis present

## 2019-10-19 DIAGNOSIS — R011 Cardiac murmur, unspecified: Secondary | ICD-10-CM | POA: Diagnosis not present

## 2019-10-19 DIAGNOSIS — R109 Unspecified abdominal pain: Secondary | ICD-10-CM

## 2019-10-19 DIAGNOSIS — K0889 Other specified disorders of teeth and supporting structures: Secondary | ICD-10-CM | POA: Diagnosis not present

## 2019-10-19 DIAGNOSIS — D649 Anemia, unspecified: Secondary | ICD-10-CM | POA: Diagnosis present

## 2019-10-19 DIAGNOSIS — Z9114 Patient's other noncompliance with medication regimen: Secondary | ICD-10-CM

## 2019-10-19 HISTORY — DX: Osteomyelitis, unspecified: M86.9

## 2019-10-19 LAB — COMPREHENSIVE METABOLIC PANEL
ALT: 19 U/L (ref 0–44)
AST: 33 U/L (ref 15–41)
Albumin: 2.6 g/dL — ABNORMAL LOW (ref 3.5–5.0)
Alkaline Phosphatase: 201 U/L — ABNORMAL HIGH (ref 38–126)
Anion gap: 14 (ref 5–15)
BUN: 7 mg/dL (ref 6–20)
CO2: 19 mmol/L — ABNORMAL LOW (ref 22–32)
Calcium: 8.2 mg/dL — ABNORMAL LOW (ref 8.9–10.3)
Chloride: 101 mmol/L (ref 98–111)
Creatinine, Ser: 0.81 mg/dL (ref 0.61–1.24)
GFR calc Af Amer: >60 mL/min (ref >60)
GFR calc non Af Amer: >60 mL/min (ref >60)
Glucose, Bld: 167 mg/dL — ABNORMAL HIGH (ref 70–99)
Potassium: 3.3 mmol/L — ABNORMAL LOW (ref 3.5–5.1)
Sodium: 134 mmol/L — ABNORMAL LOW (ref 135–145)
Total Bilirubin: 2.3 mg/dL — ABNORMAL HIGH (ref 0.3–1.2)
Total Protein: 7.5 g/dL (ref 6.5–8.1)

## 2019-10-19 LAB — CBG MONITORING, ED: Glucose-Capillary: 161 mg/dL — ABNORMAL HIGH (ref 70–99)

## 2019-10-19 LAB — GLUCOSE, CAPILLARY: Glucose-Capillary: 134 mg/dL — ABNORMAL HIGH (ref 70–99)

## 2019-10-19 LAB — CBC WITH DIFFERENTIAL/PLATELET
Abs Immature Granulocytes: 0.12 10*3/uL — ABNORMAL HIGH (ref 0.00–0.07)
Basophils Absolute: 0.1 10*3/uL (ref 0.0–0.1)
Basophils Relative: 0 %
Eosinophils Absolute: 0.3 10*3/uL (ref 0.0–0.5)
Eosinophils Relative: 2 %
HCT: 29 % — ABNORMAL LOW (ref 39.0–52.0)
Hemoglobin: 9.4 g/dL — ABNORMAL LOW (ref 13.0–17.0)
Immature Granulocytes: 1 %
Lymphocytes Relative: 7 %
Lymphs Abs: 0.9 10*3/uL (ref 0.7–4.0)
MCH: 30.5 pg (ref 26.0–34.0)
MCHC: 32.4 g/dL (ref 30.0–36.0)
MCV: 94.2 fL (ref 80.0–100.0)
Monocytes Absolute: 0.9 10*3/uL (ref 0.1–1.0)
Monocytes Relative: 7 %
Neutro Abs: 11.4 10*3/uL — ABNORMAL HIGH (ref 1.7–7.7)
Neutrophils Relative %: 83 %
Platelets: 278 10*3/uL (ref 150–400)
RBC: 3.08 MIL/uL — ABNORMAL LOW (ref 4.22–5.81)
RDW: 13.9 % (ref 11.5–15.5)
WBC: 13.8 10*3/uL — ABNORMAL HIGH (ref 4.0–10.5)
nRBC: 0 % (ref 0.0–0.2)

## 2019-10-19 LAB — MAGNESIUM: Magnesium: 1.6 mg/dL — ABNORMAL LOW (ref 1.7–2.4)

## 2019-10-19 LAB — RESPIRATORY PANEL BY RT PCR (FLU A&B, COVID)
Influenza A by PCR: NEGATIVE
Influenza B by PCR: NEGATIVE
SARS Coronavirus 2 by RT PCR: NEGATIVE

## 2019-10-19 LAB — LACTIC ACID, PLASMA
Lactic Acid, Venous: 4.5 mmol/L (ref 0.5–1.9)
Lactic Acid, Venous: 2.5 mmol/L (ref 0.5–1.9)

## 2019-10-19 LAB — LIPASE, BLOOD: Lipase: 25 U/L (ref 11–51)

## 2019-10-19 LAB — HEMOGLOBIN A1C
Hgb A1c MFr Bld: 6.8 % — ABNORMAL HIGH (ref 4.8–5.6)
Mean Plasma Glucose: 148.46 mg/dL

## 2019-10-19 MED ORDER — SODIUM CHLORIDE 0.9 % IV BOLUS
2550.0000 mL | Freq: Once | INTRAVENOUS | Status: AC
  Administered 2019-10-19: 2550 mL via INTRAVENOUS

## 2019-10-19 MED ORDER — ACETAMINOPHEN 650 MG RE SUPP: 650.0000 mg | Freq: Four times a day (QID) | RECTAL | Status: DC | PRN

## 2019-10-19 MED ORDER — ACETAMINOPHEN 325 MG PO TABS: 650.0000 mg | ORAL_TABLET | Freq: Four times a day (QID) | ORAL | Status: DC | PRN

## 2019-10-19 MED ORDER — POTASSIUM CHLORIDE 20 MEQ/15ML (10%) PO SOLN
40.0000 meq | Freq: Once | ORAL | Status: AC
  Administered 2019-10-19: 40 meq via ORAL
  Filled 2019-10-19: qty 30

## 2019-10-19 MED ORDER — PIPERACILLIN-TAZOBACTAM 3.375 G IVPB 30 MIN
3.3750 g | Freq: Once | INTRAVENOUS | Status: AC
  Administered 2019-10-19: 3.375 g via INTRAVENOUS
  Filled 2019-10-19: qty 50

## 2019-10-19 MED ORDER — INSULIN ASPART 100 UNIT/ML ~~LOC~~ SOLN
0.0000 [IU] | Freq: Three times a day (TID) | SUBCUTANEOUS | Status: DC
  Administered 2019-10-20: 1 [IU] via SUBCUTANEOUS
  Administered 2019-10-20 (×2): 2 [IU] via SUBCUTANEOUS
  Administered 2019-10-21: 1 [IU] via SUBCUTANEOUS
  Administered 2019-10-21: 2 [IU] via SUBCUTANEOUS
  Filled 2019-10-19: qty 0.09

## 2019-10-19 MED ORDER — SODIUM CHLORIDE 0.9 % IV BOLUS
1000.0000 mL | Freq: Once | INTRAVENOUS | Status: AC
  Administered 2019-10-19: 1000 mL via INTRAVENOUS

## 2019-10-19 MED ORDER — SODIUM CHLORIDE 0.9% FLUSH
3.0000 mL | Freq: Two times a day (BID) | INTRAVENOUS | Status: DC
  Administered 2019-10-20 – 2019-10-21 (×3): 3 mL via INTRAVENOUS

## 2019-10-19 MED ORDER — SODIUM CHLORIDE 0.9 % IV SOLN
2.0000 g | INTRAVENOUS | Status: DC
  Administered 2019-10-19 – 2019-10-20 (×2): 2 g via INTRAVENOUS
  Filled 2019-10-19 (×2): qty 20
  Filled 2019-10-19: qty 2
  Filled 2019-10-19: qty 20

## 2019-10-19 MED ORDER — CARVEDILOL 3.125 MG PO TABS
3.1250 mg | ORAL_TABLET | Freq: Two times a day (BID) | ORAL | Status: DC
  Administered 2019-10-19 – 2019-10-21 (×4): 3.125 mg via ORAL
  Filled 2019-10-19 (×5): qty 1

## 2019-10-19 MED ORDER — TRAZODONE HCL 50 MG PO TABS
150.0000 mg | ORAL_TABLET | Freq: Every day | ORAL | Status: DC
  Administered 2019-10-19 – 2019-10-20 (×2): 150 mg via ORAL
  Filled 2019-10-19: qty 3
  Filled 2019-10-19: qty 1

## 2019-10-19 MED ORDER — MORPHINE SULFATE (PF) 4 MG/ML IV SOLN
4.0000 mg | Freq: Once | INTRAVENOUS | Status: AC
  Administered 2019-10-19: 4 mg via INTRAVENOUS
  Filled 2019-10-19: qty 1

## 2019-10-19 MED ORDER — OXYCODONE HCL 5 MG PO TABS
5.0000 mg | ORAL_TABLET | ORAL | Status: DC | PRN
  Administered 2019-10-20: 5 mg via ORAL
  Filled 2019-10-19: qty 1

## 2019-10-19 MED ORDER — QUETIAPINE FUMARATE 100 MG PO TABS
300.0000 mg | ORAL_TABLET | Freq: Every day | ORAL | Status: DC
  Administered 2019-10-19 – 2019-10-20 (×2): 300 mg via ORAL
  Filled 2019-10-19 (×2): qty 3

## 2019-10-19 MED ORDER — MAGNESIUM SULFATE 2 GM/50ML IV SOLN
2.0000 g | Freq: Once | INTRAVENOUS | Status: AC
  Administered 2019-10-19: 2 g via INTRAVENOUS
  Filled 2019-10-19: qty 50

## 2019-10-19 MED ORDER — VANCOMYCIN HCL 1250 MG/250ML IV SOLN
1250.0000 mg | Freq: Two times a day (BID) | INTRAVENOUS | Status: DC
  Administered 2019-10-20 – 2019-10-21 (×3): 1250 mg via INTRAVENOUS
  Filled 2019-10-19 (×6): qty 250

## 2019-10-19 MED ORDER — VANCOMYCIN HCL 2000 MG/400ML IV SOLN
2000.0000 mg | Freq: Once | INTRAVENOUS | Status: AC
  Administered 2019-10-19: 2000 mg via INTRAVENOUS
  Filled 2019-10-19: qty 400

## 2019-10-19 MED ORDER — SERTRALINE HCL 50 MG PO TABS
50.0000 mg | ORAL_TABLET | Freq: Every day | ORAL | Status: DC
  Administered 2019-10-19 – 2019-10-21 (×3): 50 mg via ORAL
  Filled 2019-10-19 (×3): qty 1

## 2019-10-19 MED ORDER — MORPHINE SULFATE (PF) 2 MG/ML IV SOLN
1.0000 mg | INTRAVENOUS | Status: DC | PRN
  Filled 2019-10-19: qty 1

## 2019-10-19 MED ORDER — ACETAMINOPHEN 500 MG PO TABS
1000.0000 mg | ORAL_TABLET | Freq: Once | ORAL | Status: AC
  Administered 2019-10-19: 1000 mg via ORAL
  Filled 2019-10-19: qty 2

## 2019-10-19 MED ORDER — VANCOMYCIN HCL IN DEXTROSE 1-5 GM/200ML-% IV SOLN: 1000.0000 mg | Freq: Once | INTRAVENOUS | Status: DC

## 2019-10-19 NOTE — Progress Notes (Signed)
A consult was received from an ED physician for Vancomycin per pharmacy dosing.  The patient's profile has been reviewed for ht/wt/allergies/indication/available labs.   A one time order has been placed for Vancomycin 2gm IV x1.  Further antibiotics/pharmacy consults should be ordered by admitting physician if indicated.                       Thank you, Junita Push PharmD, BCPS 10/19/2019  4:53 PM

## 2019-10-19 NOTE — ED Provider Notes (Signed)
  Face-to-face evaluation   History: He presents for evaluation of left leg pain.  He also has a sore over his left foot, that has been draining.  He has previously been diagnosed with osteomyelitis.  He denies shortness of breath, chest pain, cough, weakness or dizziness.  He has not taken his temperature at home.  He is unsure who is currently managing his left foot infection.  Physical exam: Alert cooperative man who is comfortable.  No dysarthria or aphasia.  No respiratory distress.  Left leg is swollen, below the knee, and there is erythema, which extends about 10 to 15 cm above the left ankle.  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  Medical screening examination/treatment/procedure(s) were conducted as a shared visit with non-physician practitioner(s) and myself.  I personally evaluated the patient during the encounter    Mancel Bale, MD 10/19/19 2234

## 2019-10-19 NOTE — Progress Notes (Signed)
Pharmacy Antibiotic Note  Cory Palmer is a 59 y.o. male admitted on 10/19/2019 with osteomyelitis.  He recently completed prolonged course of doxycycline & noticed worsening L foot pain & pus.  Pharmacy has been consulted for Vancomycin dosing.  Initial dose given in ED.  Plan: Vancomycin 1250mg  IV q12h to target AUC 400-550  Rocephin per MD Monitor renal function and cx data  Check Vancomycin levels at steady state     Temp (24hrs), Avg:98.7 F (37.1 C), Min:98.5 F (36.9 C), Max:98.9 F (37.2 C)  Recent Labs  Lab 10/19/19 1451 10/19/19 1804  WBC 13.8*  --   CREATININE 0.81  --   LATICACIDVEN 4.5* 2.5*    CrCl cannot be calculated (Unknown ideal weight.).    No Known Allergies  Antimicrobials this admission: 3/20 Vanc >>  3/20 Zosyn x1 in ED 3/20 Rocephin >>   Dose adjustments this admission:  Microbiology results: 3/20 BCx:   Thank you for allowing pharmacy to be a part of this patient's care.  4/20 PharmD, BCPS 10/19/2019 7:10 PM

## 2019-10-19 NOTE — H&P (Signed)
History and Physical    Cory Palmer TOI:[id number] DOB: 1961/05/20 DOA: 10/19/2019  PCP: Cory Palmer, [name] Per  Cory Palmer coming from: Home  I have personally briefly reviewed Cory Palmer's old medical records in [place]  Chief Complaint: Left foot wound  HPI: Cory Palmer is a 59 y.o. 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  Cory Palmer has a known left foot wound/ulcer and osteomyelitis for which she is being followed and managed by orthopedics, infectious disease, and wound care as an outpatient.  He had been on oral doxycycline antibiotic therapy for couple months which he says he completed about a week ago.  Over the last couple days he noticed increased swelling and pain to his left foot.  He has been seen somewhat bloody and purulent discharge from his left plantar wound.  He denies any subjective fevers, chills, diaphoresis, chest pain, abdominal pain, nausea, vomiting, diarrhea, or dysuria.  He reports occasional heartburn but denies cough or shortness of breath.  ED Course:  Initial vitals showed BP 110/70, pulse 89, RR 19, temp 98.9 Fahrenheit, SPO2 100% on room air.  Labs show WBC 13.8, hemoglobin 9.4 (baseline 9.4-10.9), platelets 278,000, sodium 134, potassium 3.3, magnesium 1.6, bicarb 19, BUN 7, creatinine 0.81, serum glucose 167, AST 33, ALT 19, alk phos 201, total bilirubin 2.3, lactic acid 4.5, lipase 25.  SARS-CoV-2 PCR is collected and pending.  Blood cultures collected and pending.  Portable chest x-ray showed small opacity at the left lung base.  Left foot x-ray showed findings concerning for osteomyelitis involving the remaining portions of the second through fourth  metatarsals and progressive destruction of the midfoot suggestive of a Charcot joint.  RUQ ultrasound showed cirrhosis with stigmata of portal hypertension without acute abnormality, no evidence of cholelithiasis, CBD diameter 5 mm.  Left lower extremity venous Doppler preliminary read is negative for evidence of DVT.  Cory Palmer was given 3.5 L normal saline, IV vancomycin and Zosyn, Tylenol, and IV morphine.  EDP discussed the case with on-call orthopedic surgery, Dr. Sharol Given, who will see the Cory Palmer in consultation.  The hospitalist service was consulted to admit for further evaluation and management.  Review of Systems: All systems reviewed and are negative except as documented in history of present illness above.   Past Medical History:  Diagnosis Date  . Anxiety   . Depressed bipolar disorder ([place])   . Diabetes mellitus without complication ([place])   . Hypertension   . Osteomyelitis of toe of left foot [facility])     Past Surgical History:  Procedure Laterality Date  . AMPUTATION Left 09/28/2018   Procedure: LEFT TRANSMETATARSAL AMPUTATION;  Surgeon: Newt Minion, MD;  Location: [facility];  Service: Orthopedics;  Laterality: Left;  . BELOW KNEE LEG AMPUTATION Right     Social History:  reports that he has been smoking cigarettes. He has never used smokeless tobacco. He reports current alcohol use. He reports current drug use. Drug: Cocaine.  No Known Allergies  Family History  Problem Relation Age of Onset  . Hypertension Other   . Diabetes Mellitus II Other      Prior to Admission medications   Medication Sig Start Date End Date Taking? Authorizing Provider  acetaminophen (TYLENOL) 325 MG tablet Take 2 tablets (650  mg total) by mouth every 6 (six) hours as needed for mild pain (or Fever >/= 101). 05/10/18  Yes Danford, Suann Larry, MD  Blood Glucose Monitoring Suppl (ACCU-CHEK AVIVA PLUS) w/Device KIT  05/23/19  Yes [provider]  metFORMIN (GLUCOPHAGE) 1000 MG tablet  Take 1 tablet (1,000 mg total) by mouth 2 (two) times daily with a meal. Cory Palmer taking differently: Take 1,000 mg by mouth daily.  08/28/18  Yes Ward, Delice Bison, DO  Multiple Vitamin (MULTIVITAMIN WITH MINERALS) TABS tablet Take 1 tablet by mouth daily. 05/11/18  Yes Danford, Suann Larry, MD  QUEtiapine (SEROQUEL) 300 MG tablet Take 300 mg by mouth at bedtime. 08/01/19  Yes [provider]  sertraline (ZOLOFT) 50 MG tablet Take 50 mg by mouth daily. 08/01/19  Yes [provider]  traZODone (DESYREL) 150 MG tablet Take 150 mg by mouth at bedtime.   Yes [provider]  doxycycline (MONODOX) 100 MG capsule Take 1 capsule (100 mg total) by mouth 2 (two) times daily. Cory Palmer not taking: Reported on 10/19/2019 08/05/19   Thayer Headings, MD  feeding supplement, GLUCERNA SHAKE, (GLUCERNA SHAKE) LIQD Take 237 mLs by mouth 2 (two) times daily between meals. Cory Palmer not taking: Reported on 10/19/2019 05/10/18   Edwin Dada, MD  insulin aspart (NOVOLOG) 100 UNIT/ML injection Inject 0-9 Units into the skin 3 (three) times daily with meals. Cory Palmer not taking: Reported on 10/19/2019 11/02/18   Roxan Hockey, MD  loratadine (CLARITIN) 10 MG tablet Take 1 tablet (10 mg total) by mouth daily as needed for allergies, rhinitis or itching. Cory Palmer not taking: Reported on 10/19/2019 11/02/18   Roxan Hockey, MD  QUEtiapine (SEROQUEL) 200 MG tablet Take 1 tablet (200 mg total) by mouth at bedtime. Cory Palmer not taking: Reported on 10/19/2019 11/02/18   Roxan Hockey, MD  traZODone (DESYREL) 50 MG tablet Take 1 tablet (50 mg total) by mouth at bedtime as needed for sleep. Cory Palmer not taking: Reported on 10/19/2019 11/02/18   Roxan Hockey, MD    Physical Exam: Vitals:   10/19/19 1631 10/19/19 1700 10/19/19 1718 10/19/19 1810  BP: 118/69 125/83  130/81  Pulse: (!) 113 (!) 120  (!) 111  Resp: (!) 22 (!) 24  16  Temp:   98.5 F (36.9 C)   TempSrc:   Oral   SpO2: 98% 98%  96%    Constitutional: Obese man resting supine in bed, NAD, calm, comfortable Eyes: PERRL, lids and conjunctivae normal ENMT: Mucous membranes are moist. Posterior pharynx clear of any exudate or lesions.Normal dentition.  Neck: normal, supple, no masses. Respiratory: clear to auscultation bilaterally, no wheezing, no crackles. Normal respiratory effort. No accessory muscle use.  Cardiovascular: Irregularly irregular, no murmurs / rubs / gallops.  Edematous left foot.   Abdomen: no tenderness, no masses palpated. Bowel sounds positive.  Musculoskeletal: S/p right BKA with prosthesis in place.  S/p left transmetatarsal foot amputation, left foot is swollen with overlying erythema. Skin: Edematous erythematous left foot with an ulcer on the plantar surface, currently no active drainage Neurologic: CN 2-12 grossly intact. Sensation intact, Strength 5/5 in all 4.  Psychiatric: Alert and oriented x 3. Normal mood.       Labs on Admission: I have personally reviewed following labs and imaging studies  CBC: Recent Labs  Lab 10/19/19 1451  WBC 13.8*  NEUTROABS 11.4*  HGB 9.4*  HCT 29.0*  MCV 94.2  PLT 696   Basic Metabolic Panel: Recent Labs  Lab 10/19/19 1451 10/19/19  1716  NA 134*  --   K 3.3*  --   CL 101  --   CO2 19*  --   GLUCOSE 167*  --   BUN 7  --   CREATININE 0.81  --   CALCIUM 8.2*  --   MG  --  1.6*   GFR: CrCl cannot be calculated (Unknown ideal weight.). Liver Function Tests: Recent Labs  Lab 10/19/19 1451  AST 33  ALT 19  ALKPHOS 201*  BILITOT 2.3*  PROT 7.5  ALBUMIN 2.6*   No results for input(s): LIPASE, AMYLASE in the last 168 hours. No results for input(s): AMMONIA in the last 168 hours. Coagulation Profile: No results for input(s): INR, PROTIME in the last 168 hours. Cardiac Enzymes: No results for input(s): CKTOTAL, CKMB, CKMBINDEX, TROPONINI in the last 168 hours. BNP (last 3 results) No results for input(s): PROBNP in the last 8760  hours. HbA1C: No results for input(s): HGBA1C in the last 72 hours. CBG: Recent Labs  Lab 10/19/19 1545  GLUCAP 161*   Lipid Profile: No results for input(s): CHOL, HDL, LDLCALC, TRIG, CHOLHDL, LDLDIRECT in the last 72 hours. Thyroid Function Tests: No results for input(s): TSH, T4TOTAL, FREET4, T3FREE, THYROIDAB in the last 72 hours. Anemia Panel: No results for input(s): VITAMINB12, FOLATE, FERRITIN, TIBC, IRON, RETICCTPCT in the last 72 hours. Urine analysis:    Component Value Date/Time   COLORURINE AMBER (A) 10/05/2018 1153   APPEARANCEUR CLEAR 10/05/2018 1153   LABSPEC 1.034 (H) 10/05/2018 1153   PHURINE 5.0 10/05/2018 1153   GLUCOSEU NEGATIVE 10/05/2018 1153   HGBUR NEGATIVE 10/05/2018 1153   BILIRUBINUR SMALL (A) 10/05/2018 1153   KETONESUR 5 (A) 10/05/2018 1153   PROTEINUR 30 (A) 10/05/2018 1153   NITRITE NEGATIVE 10/05/2018 1153   LEUKOCYTESUR NEGATIVE 10/05/2018 1153    Radiological Exams on Admission: DG Chest 1 View  Result Date: 10/19/2019 CLINICAL DATA:  Chest pain EXAM: CHEST  1 VIEW COMPARISON:  October 29, 2018 FINDINGS: The heart size is stable from prior study. There is no pneumothorax or large pleural effusion. There is a small airspace opacity at the left lung base. There is no acute osseous abnormality. IMPRESSION: Small airspace opacity at the left lung base which could represent atelectasis or early infiltrate. Electronically Signed   By: Constance Holster M.D.   On: 10/19/2019 15:16   DG Foot Complete Left  Result Date: 10/19/2019 CLINICAL DATA:  Pain. EXAM: LEFT FOOT - COMPLETE 3+ VIEW COMPARISON:  June 20, 2019 FINDINGS: There are new extensive destructive changes involving the second through fourth metatarsals. There is overlying soft tissue swelling. There is a probable large plantar ulcer overlying the forefoot. The Cory Palmer is status post prior transmetatarsal amputation. There is extensive new lucency through the midfoot with midfoot collapse.  Findings may be secondary to disuse osteopenia or progressive Charcot foot. Osteomyelitis is not excluded. IMPRESSION: 1. Overall findings highly concerning for osteomyelitis involving the remaining portions of the second through fourth metatarsals. 2. Surrounding soft tissue swelling with a probable large plantar ulcer. 3. Progressive destruction of the midfoot suggestive of a Charcot joint. Osteomyelitis of the cuneiforms is not excluded on this study and can be further evaluated by MRI as clinically indicated. Electronically Signed   By: Constance Holster M.D.   On: 10/19/2019 15:19   VAS [place] LOWER EXTREMITY VENOUS (DVT) (MC and [facility] 7a-7p)  Result Date: 10/19/2019  Lower Venous DVTStudy Indications: Pain, Swelling, Erythema, and infection, known osteomyelitis, attends wound center.  Risk Factors: Surgery Right BKA, left transmetatarsal amputation. Limitations: Cory Palmer constantly moving and retching, pain with touch in calf. Comparison Study: Prior study done 10/09/18 is available for comparison Performing Technologist: Sharion Dove RVS  Examination Guidelines: A complete evaluation includes B-mode imaging, spectral Doppler, color Doppler, and power Doppler as needed of all accessible portions of each vessel. Bilateral testing is considered an integral part of a complete examination. Limited examinations for reoccurring indications may be performed as noted. The reflux portion of the exam is performed with the Cory Palmer in reverse Trendelenburg.  Right Technical Findings: Right leg not evaluated.  +---------+---------------+---------+-----------+----------+--------------+ LEFT     CompressibilityPhasicitySpontaneityPropertiesThrombus Aging +---------+---------------+---------+-----------+----------+--------------+ CFV      Full           Yes      Yes                                 +---------+---------------+---------+-----------+----------+--------------+ SFJ      Full                                                         +---------+---------------+---------+-----------+----------+--------------+ FV Prox  Full                                                        +---------+---------------+---------+-----------+----------+--------------+ FV Mid   Full                                                        +---------+---------------+---------+-----------+----------+--------------+ FV DistalFull                                                        +---------+---------------+---------+-----------+----------+--------------+ PFV      Full                                                        +---------+---------------+---------+-----------+----------+--------------+ POP      Full           Yes                                          +---------+---------------+---------+-----------+----------+--------------+ PTV      Full                                                        +---------+---------------+---------+-----------+----------+--------------+ PERO  Not visualized +---------+---------------+---------+-----------+----------+--------------+     Summary: LEFT: - There is no evidence of deep vein thrombosis in the lower extremity. However, portions of this examination were limited- see technologist comments above.  *See table(s) above for measurements and observations.    Preliminary    US Abdomen Limited RUQ  Result Date: 10/19/2019 CLINICAL DATA:  Pain EXAM: ULTRASOUND ABDOMEN LIMITED RIGHT UPPER QUADRANT COMPARISON:  October 30, 2018 FINDINGS: Gallbladder: No gallstones or wall thickening visualized. No sonographic Murphy sign noted by sonographer. Common bile duct: Diameter: 5 mm Liver: The liver is cirrhotic with a coarsened hepatic echotexture. There is no discrete hepatic mass. There is recanalization of the umbilical vein consistent with underlying portal hypertension. Portal vein is patent on  color Doppler imaging with normal direction of blood flow towards the liver. Other: None. IMPRESSION: 1. No acute abnormality.  No evidence for cholelithiasis. 2. Cirrhosis with stigmata of portal hypertension. Electronically Signed   By: Constance Holster M.D.   On: 10/19/2019 17:41    EKG: Independently reviewed.  Atrial flutter appearance with QTC 606.  Prior EKG showed atrial fibrillation, QTC 474, low voltage.  Assessment/Plan Principal Problem:   Osteomyelitis of left foot (HCC) Active Problems:   Diabetes mellitus type 2, noninsulin dependent (HCC)   Depressed bipolar disorder ([place])   Hypokalemia   Hypomagnesemia   PAF (paroxysmal atrial fibrillation) ([place])   Cirrhosis of liver (HCC)  Cory Palmer is a 59 y.o. 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  Osteomyelitis and ulcer of left foot: S/p left transmetatarsal amputation 09/28/2018.  Left foot x-ray showing osteomyelitis involving the remaining portions of the second through fourth metatarsals and possible Charcot joint. -Continue empiric IV antibiotics with vancomycin and ceftriaxone -Follow-up blood cultures -Orthopedics, Dr. Sharol Given to consult.  Appreciate assistance.  Paroxysmal atrial fibrillation: Not on anticoagulation due to history of anemia and medication nonadherence.  EKG shows atrial flutter appearance with tachycardia. -Start carvedilol 3.125 mg twice daily for rate control  Hypokalemia/hypomagnesemia: Replete with IV magnesium and oral potassium.  Cirrhosis of the liver: Seen on RUQ ultrasound in March 2020 and again this admission.  No evidence of decompensation, ascites, or hepatic encephalopathy.  Bilirubin and alkaline phosphatase  mildly elevated.  Hepatitis A, B, C testing was negative in March 2020.  Anemia: Likely anemia of chronic disease due to cirrhosis.  Hemoglobin currently stable without obvious bleeding.  Continue to monitor.  Type 2 diabetes: Holding home Metformin, continue sensitive SSI while in hospital and adjust as needed.  Bipolar disorder: Continue Seroquel, Zoloft, trazodone.  DVT prophylaxis: SCDs Code Status: Full code, confirmed with Cory Palmer Family Communication: Discussed with Cory Palmer, Cory Palmer states he will call his brother Disposition Plan: From home, discharge pending orthopedic evaluation with possible surgical need.  Currently requiring IV antibiotics. Consults called: Orthopedics Admission status: Inpatient, Cory Palmer likely requires greater than 2 midnight length stay for continued IV antibiotic therapy for management of left foot osteomyelitis and orthopedic evaluation and potential surgical intervention.   Zada Finders MD [facility]  If 7PM-7AM, please contact night-coverage [email or web address]  10/19/2019, 6:37 PM

## 2019-10-19 NOTE — ED Triage Notes (Signed)
Per [facility] pt from home for left foot pain and "oozing for 3-4 days".  Vitals: 110/70, 71HR, 100% on RA, 20R, CBG 262.

## 2019-10-19 NOTE — Progress Notes (Signed)
VASCULAR LAB PRELIMINARY  PRELIMINARY  PRELIMINARY  PRELIMINARY  Left lower extremity venous duplex completed.    Preliminary report:  See CV proc for preliminary results.   Deejay Koppelman, RVT 10/19/2019, 5:22 PM

## 2019-10-19 NOTE — ED Notes (Signed)
Patient sees wound care, known osteomyelitis of left foot.

## 2019-10-19 NOTE — Progress Notes (Signed)
Notified provider of need to order fluid bolus.  ?

## 2019-10-19 NOTE — ED Provider Notes (Addendum)
[facility] DEPT Provider Note   CSN: [id number] Arrival date & time: 10/19/19  1408     History Chief Complaint  Patient presents with  . Foot Pain  . Wound Infection    Cory Palmer is a 59 y.o. male presenting for evaluation of left foot pain and swelling.  Patient states the past 2 days, he has had severe worsening left foot and lower leg pain.  He states he has had bloody purulent drainage from his left foot ulcer.  He has had the ulcer for a long time, but the pain is new.  He follows with the wound clinic.  He denies fevers, chills, nausea, vomiting.  Patient states he has been out of his medications for several weeks, but later stated it had been 5 days.  He states he still has his Metformin and has been taking as prescribed, is not on insulin. He follows with Dr. Linus Salmons with ID. His last transmet amputation of the L foot was with Dr. Sharol Given in 09/2018.    Additional history obtained from chart review.  Patient with a history of anxiety, bipolar, diabetes, hypertension, chronic osteo.   HPI     Past Medical History:  Diagnosis Date  . Anxiety   . Depressed bipolar disorder ([place])   . Diabetes mellitus without complication ([place])   . Hypertension   . Osteomyelitis of toe of left foot [facility])     Patient Active Problem List   Diagnosis Date Noted  . Osteomyelitis of left foot ([place]) 10/19/2019  . Cirrhosis of liver ([place]) 10/19/2019  . Anemia 10/19/2019  . Medication monitoring encounter 08/15/2019  . Serum total bilirubin elevated 10/29/2018  . BPH (benign prostatic hyperplasia) 10/29/2018  . Non-pressure chronic ulcer of other part of left foot limited to breakdown of skin ([place])   . Below-knee amputation of right lower extremity ([place])   . Hx of BKA, right ([place])   . PAF (paroxysmal atrial fibrillation) ([place])   . Hypokalemia   . Hypomagnesemia   . Sepsis without acute organ dysfunction ([place]) 10/05/2018  . Depressed bipolar disorder ([place])  10/05/2018  . Hypertension 10/05/2018  . Hyperbilirubinemia 10/05/2018  . S/P transmetatarsal amputation of foot, left ([place]) 09/28/2018  . Wound infection 05/07/2018  . Homelessness 05/07/2018  . Diabetes mellitus type 2, noninsulin dependent ([place]) 05/07/2018  . Acute osteomyelitis involving ankle and foot, left ([place]) 05/07/2018    Past Surgical History:  Procedure Laterality Date  . AMPUTATION Left 09/28/2018   Procedure: LEFT TRANSMETATARSAL AMPUTATION;  Surgeon: Newt Minion, MD;  Location: [facility];  Service: Orthopedics;  Laterality: Left;  . BELOW KNEE LEG AMPUTATION Right        Family History  Problem Relation Age of Onset  . Hypertension Other   . Diabetes Mellitus II Other     Social History   Tobacco Use  . Smoking status: Current Every Day Smoker    Types: Cigarettes  . Smokeless tobacco: Never Used  Substance Use Topics  . Alcohol use: Yes    Comment: occasional  . Drug use: Yes    Types: Cocaine    Home Medications Prior to Admission medications   Medication Sig Start Date End Date Taking? Authorizing Provider  acetaminophen (TYLENOL) 325 MG tablet Take 2 tablets (650 mg total) by mouth every 6 (six) hours as needed for mild pain (or Fever >/= 101). 05/10/18  Yes Danford, Suann Larry, MD  Blood Glucose Monitoring Suppl (ACCU-CHEK AVIVA PLUS) w/Device KIT  05/23/19  Yes [provider]  metFORMIN (GLUCOPHAGE) 1000 MG tablet Take 1 tablet (1,000 mg total) by mouth 2 (two) times daily with a meal. Patient taking differently: Take 1,000 mg by mouth daily.  08/28/18  Yes Ward, Delice Bison, DO  Multiple Vitamin (MULTIVITAMIN WITH MINERALS) TABS tablet Take 1 tablet by mouth daily. 05/11/18  Yes Danford, Suann Larry, MD  QUEtiapine (SEROQUEL) 300 MG tablet Take 300 mg by mouth at bedtime. 08/01/19  Yes [provider]  sertraline (ZOLOFT) 50 MG tablet Take 50 mg by mouth daily. 08/01/19  Yes [provider]  traZODone (DESYREL) 150 MG  tablet Take 150 mg by mouth at bedtime.   Yes [provider]  doxycycline (MONODOX) 100 MG capsule Take 1 capsule (100 mg total) by mouth 2 (two) times daily. Patient not taking: Reported on 10/19/2019 08/05/19   Thayer Headings, MD  feeding supplement, GLUCERNA SHAKE, (GLUCERNA SHAKE) LIQD Take 237 mLs by mouth 2 (two) times daily between meals. Patient not taking: Reported on 10/19/2019 05/10/18   Edwin Dada, MD  insulin aspart (NOVOLOG) 100 UNIT/ML injection Inject 0-9 Units into the skin 3 (three) times daily with meals. Patient not taking: Reported on 10/19/2019 11/02/18   Roxan Hockey, MD  loratadine (CLARITIN) 10 MG tablet Take 1 tablet (10 mg total) by mouth daily as needed for allergies, rhinitis or itching. Patient not taking: Reported on 10/19/2019 11/02/18   Roxan Hockey, MD  QUEtiapine (SEROQUEL) 200 MG tablet Take 1 tablet (200 mg total) by mouth at bedtime. Patient not taking: Reported on 10/19/2019 11/02/18   Roxan Hockey, MD  traZODone (DESYREL) 50 MG tablet Take 1 tablet (50 mg total) by mouth at bedtime as needed for sleep. Patient not taking: Reported on 10/19/2019 11/02/18   Roxan Hockey, MD    Allergies    Patient has no known allergies.  Review of Systems   Review of Systems  Musculoskeletal: Positive for myalgias.  Skin: Positive for color change and wound.  All other systems reviewed and are negative.   Physical Exam Updated Vital Signs BP 129/62   Pulse (!) 107   Temp 98.5 F (36.9 C) (Oral)   Resp (!) 30   SpO2 96%   Physical Exam Vitals and nursing note reviewed.  Constitutional:      General: He is not in acute distress.    Appearance: He is well-developed.     Comments: Appears nontoxic  HENT:     Head: Normocephalic and atraumatic.  Eyes:     Conjunctiva/sclera: Conjunctivae normal.     Pupils: Pupils are equal, round, and reactive to light.  Cardiovascular:     Rate and Rhythm: Normal rate and regular rhythm.      Pulses: Normal pulses.  Pulmonary:     Effort: Pulmonary effort is normal. No respiratory distress.     Breath sounds: Normal breath sounds. No wheezing.  Abdominal:     General: Bowel sounds are normal. There is no distension.     Palpations: Abdomen is soft.     Tenderness: There is no abdominal tenderness.  Musculoskeletal:        General: Swelling and tenderness present.     Cervical back: Normal range of motion and neck supple.     Comments: See picture below. Chronic appearing wound of the L plantar foot. Minimal purulent drainage noted. Foot and lower leg is erythematous, warm, and tender. Mild edema.  R leg s/p R BKA  Skin:  General: Skin is warm and dry.     Capillary Refill: Capillary refill takes less than 2 seconds.  Neurological:     Mental Status: He is alert and oriented to person, place, and time.        ED Results / Procedures / Treatments   Labs (all labs ordered are listed, but only abnormal results are displayed) Labs Reviewed  CBC WITH DIFFERENTIAL/PLATELET - Abnormal; Notable for the following components:      Result Value   WBC 13.8 (*)    RBC 3.08 (*)    Hemoglobin 9.4 (*)    HCT 29.0 (*)    Neutro Abs 11.4 (*)    Abs Immature Granulocytes 0.12 (*)    All other components within normal limits  COMPREHENSIVE METABOLIC PANEL - Abnormal; Notable for the following components:   Sodium 134 (*)    Potassium 3.3 (*)    CO2 19 (*)    Glucose, Bld 167 (*)    Calcium 8.2 (*)    Albumin 2.6 (*)    Alkaline Phosphatase 201 (*)    Total Bilirubin 2.3 (*)    All other components within normal limits  LACTIC ACID, PLASMA - Abnormal; Notable for the following components:   Lactic Acid, Venous 4.5 (*)    All other components within normal limits  LACTIC ACID, PLASMA - Abnormal; Notable for the following components:   Lactic Acid, Venous 2.5 (*)    All other components within normal limits  MAGNESIUM - Abnormal; Notable for the following components:    Magnesium 1.6 (*)    All other components within normal limits  CBG MONITORING, ED - Abnormal; Notable for the following components:   Glucose-Capillary 161 (*)    All other components within normal limits  CULTURE, BLOOD (ROUTINE X 2)  CULTURE, BLOOD (ROUTINE X 2)  SARS CORONAVIRUS 2 (TAT 6-24 HRS)  RESPIRATORY PANEL BY RT PCR (FLU A&B, COVID)  LIPASE, BLOOD  HIV ANTIBODY (ROUTINE TESTING W REFLEX)  COMPREHENSIVE METABOLIC PANEL  CBC  MAGNESIUM  HEMOGLOBIN A1C    EKG EKG Interpretation  Date/Time:  Saturday October 19 2019 17:02:07 EDT Ventricular Rate:  117 PR Interval:    QRS Duration: 109 QT Interval:  440 QTC Calculation: 606 R Axis:   86 Text Interpretation: Atrial flutter Prolonged QT interval Nonspecific T wave abnormality Since last tracing QT has lengthened and T wave abnormality is new Confirmed by Daleen Bo [phone]) on 10/19/2019 5:12:57 PM   Radiology DG Chest 1 View  Result Date: 10/19/2019 CLINICAL DATA:  Chest pain EXAM: CHEST  1 VIEW COMPARISON:  October 29, 2018 FINDINGS: The heart size is stable from prior study. There is no pneumothorax or large pleural effusion. There is a small airspace opacity at the left lung base. There is no acute osseous abnormality. IMPRESSION: Small airspace opacity at the left lung base which could represent atelectasis or early infiltrate. Electronically Signed   By: Constance Holster M.D.   On: 10/19/2019 15:16   DG Foot Complete Left  Result Date: 10/19/2019 CLINICAL DATA:  Pain. EXAM: LEFT FOOT - COMPLETE 3+ VIEW COMPARISON:  June 20, 2019 FINDINGS: There are new extensive destructive changes involving the second through fourth metatarsals. There is overlying soft tissue swelling. There is a probable large plantar ulcer overlying the forefoot. The patient is status post prior transmetatarsal amputation. There is extensive new lucency through the midfoot with midfoot collapse. Findings may be secondary to disuse osteopenia or  progressive Charcot foot.  Osteomyelitis is not excluded. IMPRESSION: 1. Overall findings highly concerning for osteomyelitis involving the remaining portions of the second through fourth metatarsals. 2. Surrounding soft tissue swelling with a probable large plantar ulcer. 3. Progressive destruction of the midfoot suggestive of a Charcot joint. Osteomyelitis of the cuneiforms is not excluded on this study and can be further evaluated by MRI as clinically indicated. Electronically Signed   By: Constance Holster M.D.   On: 10/19/2019 15:19   VAS [place] LOWER EXTREMITY VENOUS (DVT) ([facility] and [facility] 7a-7p)  Result Date: 10/19/2019  Lower Venous DVTStudy Indications: Pain, Swelling, Erythema, and infection, known osteomyelitis, attends wound center.  Risk Factors: Surgery Right BKA, left transmetatarsal amputation. Limitations: Patient constantly moving and retching, pain with touch in calf. Comparison Study: Prior study done 10/09/18 is available for comparison Performing Technologist: Sharion Dove RVS  Examination Guidelines: A complete evaluation includes B-mode imaging, spectral Doppler, color Doppler, and power Doppler as needed of all accessible portions of each vessel. Bilateral testing is considered an integral part of a complete examination. Limited examinations for reoccurring indications may be performed as noted. The reflux portion of the exam is performed with the patient in reverse Trendelenburg.  Right Technical Findings: Right leg not evaluated.  +---------+---------------+---------+-----------+----------+--------------+ LEFT     CompressibilityPhasicitySpontaneityPropertiesThrombus Aging +---------+---------------+---------+-----------+----------+--------------+ CFV      Full           Yes      Yes                                 +---------+---------------+---------+-----------+----------+--------------+ SFJ      Full                                                         +---------+---------------+---------+-----------+----------+--------------+ FV Prox  Full                                                        +---------+---------------+---------+-----------+----------+--------------+ FV Mid   Full                                                        +---------+---------------+---------+-----------+----------+--------------+ FV DistalFull                                                        +---------+---------------+---------+-----------+----------+--------------+ PFV      Full                                                        +---------+---------------+---------+-----------+----------+--------------+ POP      Full  Yes                                          +---------+---------------+---------+-----------+----------+--------------+ PTV      Full                                                        +---------+---------------+---------+-----------+----------+--------------+ PERO                                                  Not visualized +---------+---------------+---------+-----------+----------+--------------+     Summary: LEFT: - There is no evidence of deep vein thrombosis in the lower extremity. However, portions of this examination were limited- see technologist comments above.  *See table(s) above for measurements and observations.    Preliminary    US Abdomen Limited RUQ  Result Date: 10/19/2019 CLINICAL DATA:  Pain EXAM: ULTRASOUND ABDOMEN LIMITED RIGHT UPPER QUADRANT COMPARISON:  October 30, 2018 FINDINGS: Gallbladder: No gallstones or wall thickening visualized. No sonographic Murphy sign noted by sonographer. Common bile duct: Diameter: 5 mm Liver: The liver is cirrhotic with a coarsened hepatic echotexture. There is no discrete hepatic mass. There is recanalization of the umbilical vein consistent with underlying portal hypertension. Portal vein is patent on color Doppler imaging with normal  direction of blood flow towards the liver. Other: None. IMPRESSION: 1. No acute abnormality.  No evidence for cholelithiasis. 2. Cirrhosis with stigmata of portal hypertension. Electronically Signed   By: Constance Holster M.D.   On: 10/19/2019 17:41    Procedures .Critical Care Performed by: Franchot Heidelberg, PA-C Authorized by: Franchot Heidelberg, PA-C   Critical care provider statement:    Critical care time (minutes):  45   Critical care time was exclusive of:  Separately billable procedures and treating other patients and teaching time   Critical care was necessary to treat or prevent imminent or life-threatening deterioration of the following conditions:  Sepsis   Critical care was time spent personally by me on the following activities:  Blood draw for specimens, development of treatment plan with patient or surrogate, discussions with consultants, evaluation of patient's response to treatment, examination of patient, obtaining history from patient or surrogate, ordering and performing treatments and interventions, ordering and review of laboratory studies, ordering and review of radiographic studies, re-evaluation of patient's condition, pulse oximetry and review of old charts   I assumed direction of critical care for this patient from another provider in my specialty: no   Comments:     Patient became septic while in the ED.  IV antibiotics and fluids started.  Patient to be admitted.   (including critical care time)  Medications Ordered in ED Medications  sodium chloride flush (NS) 0.9 % injection 3 mL (has no administration in time range)  acetaminophen (TYLENOL) tablet 650 mg (has no administration in time range)    Or  acetaminophen (TYLENOL) suppository 650 mg (has no administration in time range)  oxyCODONE (Oxy IR/ROXICODONE) immediate release tablet 5 mg (has no administration in time range)  cefTRIAXone (ROCEPHIN) 2 g in sodium chloride 0.9 % 100 mL IVPB (has  no  administration in time range)  magnesium sulfate IVPB 2 g 50 mL (2 g Intravenous New Bag/Given 10/19/19 1941)  traZODone (DESYREL) tablet 150 mg (has no administration in time range)  sertraline (ZOLOFT) tablet 50 mg (50 mg Oral Given 10/19/19 1933)  QUEtiapine (SEROQUEL) tablet 300 mg (has no administration in time range)  insulin aspart (novoLOG) injection 0-9 Units (has no administration in time range)  carvedilol (COREG) tablet 3.125 mg (3.125 mg Oral Given 10/19/19 2011)  morphine 2 MG/ML injection 1 mg (has no administration in time range)  vancomycin (VANCOREADY) IVPB 1250 mg/250 mL (has no administration in time range)  piperacillin-tazobactam (ZOSYN) IVPB 3.375 g (0 g Intravenous Stopped 10/19/19 1644)  morphine 4 MG/ML injection 4 mg (4 mg Intravenous Given 10/19/19 1530)  sodium chloride 0.9 % bolus 1,000 mL (1,000 mLs Intravenous New Bag/Given 10/19/19 1714)  vancomycin (VANCOREADY) IVPB 2000 mg/400 mL (0 mg Intravenous Stopped 10/19/19 1924)  acetaminophen (TYLENOL) tablet 1,000 mg (1,000 mg Oral Given 10/19/19 1714)  sodium chloride 0.9 % bolus 2,550 mL (0 mLs Intravenous Stopped 10/19/19 1924)  potassium chloride 20 MEQ/15ML (10%) solution 40 mEq (40 mEq Oral Given 10/19/19 1934)    ED Course  I have reviewed the triage vital signs and the nursing notes.  Pertinent labs & imaging results that were available during my care of the patient were reviewed by me and considered in my medical decision making (see chart for details).    MDM Rules/Calculators/A&P                      Patient presented for evaluation of worsening left foot and leg pain and swelling.  He also reports purulent drainage from a chronic foot ulcer.  On exam, lower leg is erythematous, warm, and with minimal purulent drainage from an ulcer.  Concern for superimposed infection.  Concern for worsening osteo.  Also consider DVT, as pain extends all the way up the calf.  Will obtain ultrasound, labs, and start  antibiotics.  Ultrasound negative for DVT.  Patient became tachycardic and tachypneic.  Feels warm to the touch.  Labs interpreted by me, shows elevated white count and lactic.  At this time, with abnormal vital signs and abnormal labs, concern for sepsis.  As such, code sepsis was called.  Due to lactic of 4.5, will fluid resuscitate with 30 cc/kg.  Foot x-ray viewed interpreted by me, consistent with bone degeneration and osteomyelitis.  Chest x-ray viewed interpreted by me, opacity noted in the left lung base.  Per radiology, could be atelectasis versus infection.  Patient without cough, and lung sounds are normal.  As such, low suspicion for pneumonia.  Reassessment, patient reports upper abdominal pain.  As bili and alk phos are elevated, will obtain right upper quadrant ultrasound to rule out gallbladder etiology for sepsis, though favor osteo/cellulitis at this time.  EKG shows a flutter, pt with h/o same. Not on anticoaguation. Chads2Vasc of 2.  New t-wave abnormality today, will check mag along with other elector lytes.    Upper quadrant ultrasound negative for acute findings gallbladder or liver.  Discussed with Dr. Sharol Given from orthopedics who will consult while patient is admitted regarding osteomyelitis.  Discussed with Dr. Posey Pronto from [facility] hospital[facility], patient to be admitted.  Final Clinical Impression(s) / ED Diagnoses Final diagnoses:  Abdominal pain  Sepsis, due to unspecified organism, unspecified whether acute organ dysfunction present ([place])  Osteomyelitis of left foot, unspecified type ([place])    Rx /  DC Orders ED Discharge Orders    None       Franchot Heidelberg, PA-C 10/19/19 2032    Franchot Heidelberg, PA-C 10/19/19 2121    Daleen Bo, MD 10/19/19 2234

## 2019-10-20 ENCOUNTER — Other Ambulatory Visit: Payer: Self-pay

## 2019-10-20 DIAGNOSIS — E1142 Type 2 diabetes mellitus with diabetic polyneuropathy: Secondary | ICD-10-CM | POA: Diagnosis present

## 2019-10-20 DIAGNOSIS — R7881 Bacteremia: Secondary | ICD-10-CM

## 2019-10-20 DIAGNOSIS — M86272 Subacute osteomyelitis, left ankle and foot: Secondary | ICD-10-CM

## 2019-10-20 DIAGNOSIS — E43 Unspecified severe protein-calorie malnutrition: Secondary | ICD-10-CM | POA: Diagnosis present

## 2019-10-20 LAB — COMPREHENSIVE METABOLIC PANEL
ALT: 16 U/L (ref 0–44)
AST: 24 U/L (ref 15–41)
Albumin: 2.1 g/dL — ABNORMAL LOW (ref 3.5–5.0)
Alkaline Phosphatase: 170 U/L — ABNORMAL HIGH (ref 38–126)
Anion gap: 9 (ref 5–15)
BUN: 6 mg/dL (ref 6–20)
CO2: 19 mmol/L — ABNORMAL LOW (ref 22–32)
Calcium: 7.6 mg/dL — ABNORMAL LOW (ref 8.9–10.3)
Chloride: 104 mmol/L (ref 98–111)
Creatinine, Ser: 0.58 mg/dL — ABNORMAL LOW (ref 0.61–1.24)
GFR calc Af Amer: >60 mL/min (ref >60)
GFR calc non Af Amer: >60 mL/min (ref >60)
Glucose, Bld: 144 mg/dL — ABNORMAL HIGH (ref 70–99)
Potassium: 3.4 mmol/L — ABNORMAL LOW (ref 3.5–5.1)
Sodium: 132 mmol/L — ABNORMAL LOW (ref 135–145)
Total Bilirubin: 2.1 mg/dL — ABNORMAL HIGH (ref 0.3–1.2)
Total Protein: 6.3 g/dL — ABNORMAL LOW (ref 6.5–8.1)

## 2019-10-20 LAB — CBC
HCT: 27.3 % — ABNORMAL LOW (ref 39.0–52.0)
Hemoglobin: 8.9 g/dL — ABNORMAL LOW (ref 13.0–17.0)
MCH: 30.6 pg (ref 26.0–34.0)
MCHC: 32.6 g/dL (ref 30.0–36.0)
MCV: 93.8 fL (ref 80.0–100.0)
Platelets: 220 10*3/uL (ref 150–400)
RBC: 2.91 MIL/uL — ABNORMAL LOW (ref 4.22–5.81)
RDW: 14.1 % (ref 11.5–15.5)
WBC: 10.2 10*3/uL (ref 4.0–10.5)
nRBC: 0 % (ref 0.0–0.2)

## 2019-10-20 LAB — GLUCOSE, CAPILLARY
Glucose-Capillary: 128 mg/dL — ABNORMAL HIGH (ref 70–99)
Glucose-Capillary: 176 mg/dL — ABNORMAL HIGH (ref 70–99)
Glucose-Capillary: 151 mg/dL — ABNORMAL HIGH (ref 70–99)
Glucose-Capillary: 145 mg/dL — ABNORMAL HIGH (ref 70–99)

## 2019-10-20 LAB — MAGNESIUM: Magnesium: 1.8 mg/dL (ref 1.7–2.4)

## 2019-10-20 LAB — HIV ANTIBODY (ROUTINE TESTING W REFLEX): HIV Screen 4th Generation wRfx: NONREACTIVE

## 2019-10-20 MED ORDER — LORAZEPAM 2 MG/ML IJ SOLN: 0.5000 mg | Freq: Four times a day (QID) | INTRAMUSCULAR | Status: DC | PRN

## 2019-10-20 MED ORDER — ENOXAPARIN SODIUM 40 MG/0.4ML ~~LOC~~ SOLN
40.0000 mg | SUBCUTANEOUS | Status: DC
  Administered 2019-10-20 – 2019-10-21 (×2): 40 mg via SUBCUTANEOUS
  Filled 2019-10-20 (×2): qty 0.4

## 2019-10-20 MED ORDER — POTASSIUM CHLORIDE CRYS ER 20 MEQ PO TBCR
20.0000 meq | EXTENDED_RELEASE_TABLET | Freq: Once | ORAL | Status: AC
  Administered 2019-10-20: 20 meq via ORAL
  Filled 2019-10-20: qty 1

## 2019-10-20 MED ORDER — MAGNESIUM OXIDE 400 (241.3 MG) MG PO TABS
400.0000 mg | ORAL_TABLET | Freq: Once | ORAL | Status: AC
  Administered 2019-10-20: 400 mg via ORAL
  Filled 2019-10-20: qty 1

## 2019-10-20 NOTE — Progress Notes (Signed)
PHARMACY - PHYSICIAN COMMUNICATION CRITICAL VALUE ALERT - BLOOD CULTURE IDENTIFICATION (BCID)  Cory Palmer is an 59 y.o. male who presented to [facility] on 10/19/2019 with a chief complaint of LLE pain.  Assessment:  59 yo M with known osteomyelitis. He recently completed prolonged course of doxycycline & noticed worsening L foot pain & pus.  Ortho recommending transtibial amputation. Blood cx growing [facility] in aerobic bottle of 1 set.   Name of physician (or Provider) Contacted: Dairl Ponder  Current antibiotics: Rocephin 2gm IV q24h + Vancomycin 1250mg  IV Q12h  Changes to prescribed antibiotics recommended:  Patient is on recommended antibiotics - No changes needed  Results for orders placed or performed during the hospital encounter of 10/04/18  Blood Culture ID Panel (Reflexed) (Collected: 10/04/2018 10:34 PM)  Result Value Ref Range   Enterococcus species NOT DETECTED NOT DETECTED   Listeria monocytogenes NOT DETECTED NOT DETECTED   Staphylococcus species DETECTED (A) NOT DETECTED   Staphylococcus aureus (BCID) DETECTED (A) NOT DETECTED   Methicillin resistance NOT DETECTED NOT DETECTED   Streptococcus species DETECTED (A) NOT DETECTED   Streptococcus agalactiae NOT DETECTED NOT DETECTED   Streptococcus pneumoniae NOT DETECTED NOT DETECTED   Streptococcus pyogenes NOT DETECTED NOT DETECTED   Acinetobacter baumannii NOT DETECTED NOT DETECTED   Enterobacteriaceae species NOT DETECTED NOT DETECTED   Enterobacter cloacae complex NOT DETECTED NOT DETECTED   Escherichia coli NOT DETECTED NOT DETECTED   Klebsiella oxytoca NOT DETECTED NOT DETECTED   Klebsiella pneumoniae NOT DETECTED NOT DETECTED   Proteus species NOT DETECTED NOT DETECTED   Serratia marcescens NOT DETECTED NOT DETECTED   Haemophilus influenzae NOT DETECTED NOT DETECTED   Neisseria meningitidis NOT DETECTED NOT DETECTED   Pseudomonas aeruginosa NOT DETECTED NOT DETECTED   Candida albicans NOT DETECTED NOT DETECTED   Candida glabrata NOT DETECTED NOT DETECTED   Candida krusei NOT DETECTED NOT DETECTED   Candida parapsilosis NOT DETECTED NOT DETECTED   Candida tropicalis NOT DETECTED NOT DETECTED    12/04/2018 PharmD, BCPS 10/20/2019  11:24 AM

## 2019-10-20 NOTE — Progress Notes (Addendum)
PROGRESS NOTE    Cory Palmer    Code Status: Full Code  WER:[id number] DOB: 1961-04-08 DOA: 10/19/2019 LOS: 1 days  PCP: Patient, [name] Per CC:  Chief Complaint  Patient presents with  . Foot Pain  . 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  ED Course: Initial vitals showed BP 110/70, pulse 89, RR 19, temp 98.9 Fahrenheit, SPO2 100% on room air. Labs show WBC 13.8, hemoglobin 9.4 (baseline 9.4-10.9), platelets 278,000, sodium 134, potassium 3.3, magnesium 1.6, bicarb 19, BUN 7, creatinine 0.81, serum glucose 167, AST 33, ALT 19, alk phos 201, total bilirubin 2.3, lactic acid 4.5, lipase 25.  CXR showed small opacity at the left lung base. Left foot x-ray showed findings concerning for osteomyelitis involving the remaining portions of the second through fourth metatarsals and progressive destruction of the midfoot suggestive of a Charcot joint. RUQ ultrasound showed cirrhosis with stigmata of portal hypertension without acute abnormality, no evidence of cholelithiasis, CBD diameter 5 mm. Left lower extremity venous Doppler preliminary read is negative for evidence of DVT. Patient was given 3.5 L normal saline, IV vancomycin and Zosyn, Tylenol, and IV morphine.    3/21: Evaluated by Dr. Sharol Given, Ortho, recommended transfer to [facility] for transtibial amputation on Wednesday. Blood cultures positive for GPCC.  Continued vancomycin/ceftriaxone.  Echo ordered.  Transferred to  [facility]   Principal Problem:   Osteomyelitis of left foot ([place]) Active Problems:   Diabetes mellitus type 2, noninsulin dependent (HCC)   Depressed bipolar disorder (HCC)   Hypokalemia   Hypomagnesemia   PAF (paroxysmal atrial fibrillation) (HCC)   Cirrhosis of liver (HCC)   Anemia   Severe protein-calorie malnutrition (HCC)   Diabetic polyneuropathy associated with type 2 diabetes mellitus ([place])   1. Osteomyelitis of left lower extremity status post left transmetatarsal amputation 09/28/2018 a. Afebrile without leukocytosis on room air b. Continue vancomycin/ceftriaxone c. Per orthopedic surgery: Recommend transfer to [facility] for transtibial amputation to be done on Wednesday 2. Gram-positive cocci in cluster bacteremia a. Continue vancomycin/ceftriaxone b. Repeat blood cultures c. Echo to rule out endocarditis 3. Paroxysmal atrial fibrillation, atrial flutter, currently rate controlled a. A flutter on presenting EKG b. Continue carvedilol 3.125 mg twice daily started this admission c. Goal K> 4.0, goal Mg > 2.0  4. Hypokalemia, replete 5. Hypomagnesemia, replete 6. Compensated liver cirrhosis a. Negative hepatitis panel in March 2020 7. Normocytic anemia 8. Type 2 diabetes a. Holding home Metformin and continuing sliding scale 9. Bipolar disorder on home Seroquel, Zoloft and trazodone  DVT prophylaxis: Lovenox, SCDs Family Communication: None Disposition Plan:   Patient came from:   Home, lives alone and ambulates with a cane  Anticipated d/c place: TBD  Barriers to d/c: Patient requiring IV antibiotics and has upcoming surgery on Wednesday  Pressure injury documentation    None  Consultants  Ortho  Procedures  None  Antibiotics   Anti-infectives (From admission, onward)   Start     Dose/Rate Route Frequency Ordered Stop   10/20/19 0600  vancomycin (VANCOREADY) IVPB 1250  mg/250 mL     1,250 mg 166.7 mL/hr over 90 Minutes Intravenous Every 12 hours 10/19/19 1957     10/19/19 2200  cefTRIAXone (ROCEPHIN) 2 g in sodium chloride 0.9 % 100 mL IVPB     2 g 200 mL/hr over 30 Minutes Intravenous Every 24 hours 10/19/19 1906     10/19/19 1700  vancomycin (VANCOCIN) IVPB 1000 mg/200 mL premix  Status:  Discontinued     1,000 mg 200 mL/hr over 60 Minutes Intravenous  Once 10/19/19 1648 10/19/19 1651   10/19/19 1700  vancomycin (VANCOREADY) IVPB 2000 mg/400 mL     2,000 mg 200 mL/hr over 120 Minutes Intravenous  Once 10/19/19 1651 10/19/19 1924   10/19/19 1430  piperacillin-tazobactam (ZOSYN) IVPB 3.375 g     3.375 g 100 mL/hr over 30 Minutes Intravenous  Once 10/19/19 1428 10/19/19 1644        Subjective   Patient seen and examined at bedside in no acute distress and resting comfortably. No acute events overnight. Denies any acute complaints at this time. Tolerating diet well.  Notified that patient's blood cultures are positive for GPCC  Objective   Vitals:   10/20/19 0241 10/20/19 0616 10/20/19 0937 10/20/19 1421  BP: (!) 145/88 112/69  124/80  Pulse: 87 85  76  Resp: 18 16  18   Temp: 98.4 F (36.9 C) 99.5 F (37.5 C)  98.7 F (37.1 C)  TempSrc: Oral Oral  Oral  SpO2: 99% 98%  96%  Height:   6' 4"  (1.93 m)     Intake/Output Summary (Last 24 hours) at 10/20/2019 1452 Last data filed at 10/20/2019 1300 Gross per 24 hour  Intake 3571.18 ml  Output 825 ml  Net 2746.18 ml   There were no vitals filed for this visit.  Examination:  Physical Exam Vitals and nursing note reviewed.  Constitutional:      Appearance: Normal appearance.  HENT:     Head: Normocephalic and atraumatic.  Eyes:     Conjunctiva/sclera: Conjunctivae normal.  Cardiovascular:     Rate and Rhythm: Normal rate and regular rhythm.  Pulmonary:     Effort: Pulmonary effort is normal.     Breath sounds: Normal breath sounds.  Abdominal:     General: Abdomen is flat.      Palpations: Abdomen is soft.  Musculoskeletal:     Comments: R BKA Left leg with swelling  Skin:    Coloration: Skin is not jaundiced or pale.  Neurological:     Mental Status: He is alert. Mental status is at baseline.  Psychiatric:        Mood and Affect: Mood normal.        Behavior: Behavior normal.     Data Reviewed: I have personally reviewed following labs and imaging studies  CBC: Recent Labs  Lab 10/19/19 1451 10/20/19 0504  WBC 13.8* 10.2  NEUTROABS 11.4*  --   HGB 9.4* 8.9*  HCT 29.0* 27.3*  MCV 94.2 93.8  PLT 278 518   Basic Metabolic Panel: Recent Labs  Lab 10/19/19 1451 10/19/19 1716 10/20/19 0504  NA 134*  --  132*  K 3.3*  --  3.4*  CL 101  --  104  CO2 19*  --  19*  GLUCOSE 167*  --  144*  BUN 7  --  6  CREATININE 0.81  --  0.58*  CALCIUM 8.2*  --  7.6*  MG  --  1.6* 1.8   GFR: CrCl cannot be calculated (Unknown ideal weight.). Liver Function Tests: Recent Labs  Lab 10/19/19 1451 10/20/19 0504  AST 33 24  ALT 19 16  ALKPHOS 201* 170*  BILITOT 2.3* 2.1*  PROT 7.5 6.3*  ALBUMIN 2.6* 2.1*   Recent Labs  Lab 10/19/19 1657  LIPASE 25   No results for input(s): AMMONIA in the last 168 hours. Coagulation Profile: No results for input(s): INR, PROTIME in the last 168 hours. Cardiac Enzymes: No results for input(s): CKTOTAL, CKMB, CKMBINDEX, TROPONINI in the last 168 hours. BNP (last 3 results) No results for input(s): PROBNP in the last 8760 hours. HbA1C: Recent Labs    10/19/19 1915  HGBA1C 6.8*   CBG: Recent Labs  Lab 10/19/19 1545 10/19/19 2152 10/20/19 0803 10/20/19 1211  GLUCAP 161* 134* 128* 176*   Lipid Profile: No results for input(s): CHOL, HDL, LDLCALC, TRIG, CHOLHDL, LDLDIRECT in the last 72 hours. Thyroid Function Tests: No results for input(s): TSH, T4TOTAL, FREET4, T3FREE, THYROIDAB in the last 72 hours. Anemia Panel: No results for input(s): VITAMINB12, FOLATE, FERRITIN, TIBC, IRON, RETICCTPCT in the  last 72 hours. Sepsis Labs: Recent Labs  Lab 10/19/19 1451 10/19/19 1804  LATICACIDVEN 4.5* 2.5*    Recent Results (from the past 240 hour(s))  Blood culture (routine x 2)     Status: None (Preliminary result)   Collection Time: 10/19/19  2:51 PM   Specimen: BLOOD RIGHT FOREARM  Result Value Ref Range Status   Specimen Description   Final    BLOOD RIGHT FOREARM Performed at [facility], [place] [address], [place], [place] [address]    Special Requests   Final    BOTTLES DRAWN AEROBIC AND ANAEROBIC Blood Culture adequate volume Performed at [place] [address], [place], [place] [address]    Culture  Setup Time   Final    GRAM POSITIVE COCCI IN CLUSTERS IN BOTH AEROBIC AND ANAEROBIC BOTTLES CRITICAL RESULT CALLED TO, READ BACK BY AND VERIFIED WITH: [place] [id number] AT [id number] BY CM Performed at [facility], [place] [address] [place], [place] [address]    Culture GRAM POSITIVE COCCI IN CLUSTERS  Final   Report Status PENDING  Incomplete  Respiratory Panel by RT PCR (Flu A&B, Covid) - Nasopharyngeal Swab     Status: None   Collection Time: 10/19/19  8:07 PM   Specimen: Nasopharyngeal Swab  Result Value Ref Range Status   SARS Coronavirus 2 by RT PCR NEGATIVE NEGATIVE Final    Comment: (NOTE) SARS-CoV-2 target nucleic acids are NOT DETECTED. The SARS-CoV-2 RNA is generally detectable in upper respiratoy specimens during the acute phase of infection. The lowest concentration of SARS-CoV-2 viral copies this assay can detect is 131 copies/mL. A negative result does not preclude SARS-Cov-2 infection and should not be used as the sole basis for treatment or other patient management decisions. A negative result may occur with  improper specimen collection/handling, submission of specimen other than nasopharyngeal swab, presence of viral mutation(s) within the areas targeted by this assay, and inadequate number of viral copies (<131  copies/mL). A negative result must be combined  with clinical observations, patient history, and epidemiological information. The expected result is Negative. Fact Sheet for Patients:  PinkCheek.be Fact Sheet for Healthcare Providers:  GravelBags.it This test is not yet ap proved or cleared by the [place] FDA and  has been authorized for detection and/or diagnosis of SARS-CoV-2 by FDA under an Emergency Use Authorization (EUA). This EUA will remain  in effect (meaning this test can be used) for the duration of the COVID-19 declaration under Section 564(b)(1) of the Act, 21 U.S.C. section 360bbb-3(b)(1), unless the authorization is terminated or revoked sooner.    Influenza A by PCR NEGATIVE NEGATIVE Final   Influenza B by PCR NEGATIVE NEGATIVE Final    Comment: (NOTE) The Xpert Xpress SARS-CoV-2/FLU/RSV assay is intended as an aid in  the diagnosis of influenza from Nasopharyngeal swab specimens and  should not be used as a sole basis for treatment. Nasal washings and  aspirates are unacceptable for Xpert Xpress SARS-CoV-2/FLU/RSV  testing. Fact Sheet for Patients: PinkCheek.be Fact Sheet for Healthcare Providers: GravelBags.it This test is not yet approved or cleared by the [place] FDA and  has been authorized for detection and/or diagnosis of SARS-CoV-2 by  FDA under an Emergency Use Authorization (EUA). This EUA will remain  in effect (meaning this test can be used) for the duration of the  Covid-19 declaration under Section 564(b)(1) of the Act, 21  U.S.C. section 360bbb-3(b)(1), unless the authorization is  terminated or revoked. Performed at [facility], [place] [address] [place], [place] [address]          Radiology Studies: DG Chest 1 View  Result Date: 10/19/2019 CLINICAL DATA:  Chest pain EXAM: CHEST  1 VIEW COMPARISON:   October 29, 2018 FINDINGS: The heart size is stable from prior study. There is no pneumothorax or large pleural effusion. There is a small airspace opacity at the left lung base. There is no acute osseous abnormality. IMPRESSION: Small airspace opacity at the left lung base which could represent atelectasis or early infiltrate. Electronically Signed   By: Constance Holster M.D.   On: 10/19/2019 15:16   DG Foot Complete Left  Result Date: 10/19/2019 CLINICAL DATA:  Pain. EXAM: LEFT FOOT - COMPLETE 3+ VIEW COMPARISON:  June 20, 2019 FINDINGS: There are new extensive destructive changes involving the second through fourth metatarsals. There is overlying soft tissue swelling. There is a probable large plantar ulcer overlying the forefoot. The patient is status post prior transmetatarsal amputation. There is extensive new lucency through the midfoot with midfoot collapse. Findings may be secondary to disuse osteopenia or progressive Charcot foot. Osteomyelitis is not excluded. IMPRESSION: 1. Overall findings highly concerning for osteomyelitis involving the remaining portions of the second through fourth metatarsals. 2. Surrounding soft tissue swelling with a probable large plantar ulcer. 3. Progressive destruction of the midfoot suggestive of a Charcot joint. Osteomyelitis of the cuneiforms is not excluded on this study and can be further evaluated by MRI as clinically indicated. Electronically Signed   By: Constance Holster M.D.   On: 10/19/2019 15:19   VAS [place] LOWER EXTREMITY VENOUS (DVT) (MC and WL 7a-7p)  Result Date: 10/20/2019  Lower Venous DVTStudy Indications: Pain, Swelling, Erythema, and infection, known osteomyelitis, attends wound center.  Risk Factors: Surgery Right BKA, left transmetatarsal amputation. Limitations: Patient constantly moving and retching, pain with touch in calf. Comparison Study: Prior study done 10/09/18 is available for comparison Performing Technologist: Sharion Dove RVS   Examination Guidelines: A complete evaluation includes B-mode  imaging, spectral Doppler, color Doppler, and power Doppler as needed of all accessible portions of each vessel. Bilateral testing is considered an integral part of a complete examination. Limited examinations for reoccurring indications may be performed as noted. The reflux portion of the exam is performed with the patient in reverse Trendelenburg.  Right Technical Findings: Right leg not evaluated.  +---------+---------------+---------+-----------+----------+--------------+ LEFT     CompressibilityPhasicitySpontaneityPropertiesThrombus Aging +---------+---------------+---------+-----------+----------+--------------+ CFV      Full           Yes      Yes                                 +---------+---------------+---------+-----------+----------+--------------+ SFJ      Full                                                        +---------+---------------+---------+-----------+----------+--------------+ FV Prox  Full                                                        +---------+---------------+---------+-----------+----------+--------------+ FV Mid   Full                                                        +---------+---------------+---------+-----------+----------+--------------+ FV DistalFull                                                        +---------+---------------+---------+-----------+----------+--------------+ PFV      Full                                                        +---------+---------------+---------+-----------+----------+--------------+ POP      Full           Yes                                          +---------+---------------+---------+-----------+----------+--------------+ PTV      Full                                                        +---------+---------------+---------+-----------+----------+--------------+ PERO  Not visualized +---------+---------------+---------+-----------+----------+--------------+     Summary: LEFT: - Findings appear essentially unchanged compared to previous examination. - There is no evidence of deep vein thrombosis in the lower extremity. However, portions of this examination were limited- see technologist comments above.  *See table(s) above for measurements and observations. Electronically signed by Ruta Hinds MD on 10/20/2019 at 12:37:42 PM.    Final    US Abdomen Limited RUQ  Result Date: 10/19/2019 CLINICAL DATA:  Pain EXAM: ULTRASOUND ABDOMEN LIMITED RIGHT UPPER QUADRANT COMPARISON:  October 30, 2018 FINDINGS: Gallbladder: No gallstones or wall thickening visualized. No sonographic Murphy sign noted by sonographer. Common bile duct: Diameter: 5 mm Liver: The liver is cirrhotic with a coarsened hepatic echotexture. There is no discrete hepatic mass. There is recanalization of the umbilical vein consistent with underlying portal hypertension. Portal vein is patent on color Doppler imaging with normal direction of blood flow towards the liver. Other: None. IMPRESSION: 1. No acute abnormality.  No evidence for cholelithiasis. 2. Cirrhosis with stigmata of portal hypertension. Electronically Signed   By: Constance Holster M.D.   On: 10/19/2019 17:41        Scheduled Meds: . carvedilol  3.125 mg Oral BID WC  . enoxaparin (LOVENOX) injection  40 mg Subcutaneous Q24H  . insulin aspart  0-9 Units Subcutaneous TID WC  . QUEtiapine  300 mg Oral QHS  . sertraline  50 mg Oral Daily  . sodium chloride flush  3 mL Intravenous Q12H  . traZODone  150 mg Oral QHS   Continuous Infusions: . cefTRIAXone (ROCEPHIN)  IV 2 g (10/19/19 2249)  . vancomycin 1,250 mg (10/20/19 0529)     Time spent: 25 minutes with over 50% of the time coordinating the patient's care    Harold Hedge, DO [facility] Pager [phone]  Call night coverage person covering after  7pm

## 2019-10-20 NOTE — Progress Notes (Signed)
Pt arrived to the unit, not in distress and tolerated well. 

## 2019-10-20 NOTE — Consult Note (Signed)
ORTHOPAEDIC CONSULTATION  REQUESTING PHYSICIAN: Harold Hedge, MD  Chief Complaint: Cellulitis swelling and pain left foot.  HPI: Cory Palmer is a 59 y.o. male who presents with acute swelling pain and redness left foot.  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  Patient states the ulceration pain redness and swelling is acute.  Past Medical History:  Diagnosis Date  . Anxiety   . Depressed bipolar disorder ([place])   . Diabetes mellitus without complication ([place])   . Hypertension   . Osteomyelitis of toe of left foot [facility])    Past Surgical History:  Procedure Laterality Date  . AMPUTATION Left 09/28/2018   Procedure: LEFT TRANSMETATARSAL AMPUTATION;  Surgeon: Newt Minion, MD;  Location: [facility];  Service: Orthopedics;  Laterality: Left;  . BELOW KNEE LEG AMPUTATION Right    Social History   Socioeconomic History  . Marital status: Single    Spouse name: Not on file  . Number of children: Not on file  . Years of education: Not on file  . Highest education level: Not on file  Occupational History  . Not on file  Tobacco Use  . Smoking status: Current Every Day Smoker    Types: Cigarettes  . Smokeless tobacco: Never Used  Substance and Sexual Activity  . Alcohol use: Yes    Comment: occasional  . Drug use: Yes    Types: Cocaine  . Sexual activity: Not on file  Other Topics Concern  . Not on file  Social History Narrative  . Not on file   Social Determinants of Health   Financial Resource Strain:   . Difficulty of Paying Living Expenses:   Food Insecurity:   . Worried About Charity fundraiser in the Last Year:   . Arboriculturist in the Last Year:   Transportation Needs:   . Film/video editor (Medical):   Marland Kitchen Lack of Transportation (Non-Medical):   Physical Activity:   . Days of Exercise per Week:   . Minutes of Exercise per Session:   Stress:   . Feeling of  Stress :   Social Connections:   . Frequency of Communication with Friends and Family:   . Frequency of Social Gatherings with Friends and Family:   . Attends Religious Services:   . Active Member of Clubs or Organizations:   . Attends Archivist Meetings:   Marland Kitchen Marital Status:    Family History  Problem Relation Age of Onset  . Hypertension Other   . Diabetes Mellitus II Other    - negative except otherwise stated in the family history section No Known Allergies Prior to Admission medications   Medication Sig Start Date End Date Taking? Authorizing Provider  acetaminophen (TYLENOL) 325 MG tablet Take 2 tablets (650 mg total) by mouth every 6 (six) hours as needed for mild pain (or Fever >/= 101). 05/10/18  Yes Danford, Suann Larry, MD  Blood Glucose Monitoring Suppl (ACCU-CHEK AVIVA PLUS) w/Device KIT  05/23/19  Yes [provider]  metFORMIN (GLUCOPHAGE) 1000 MG tablet Take 1 tablet (1,000 mg total) by mouth 2 (two) times daily with a meal. Patient taking differently: Take 1,000 mg by mouth daily.  08/28/18  Yes Ward, Delice Bison, DO  Multiple Vitamin (MULTIVITAMIN WITH MINERALS) TABS tablet Take 1 tablet by mouth daily. 05/11/18  Yes Danford, Suann Larry, MD  QUEtiapine (SEROQUEL) 300 MG tablet Take 300 mg by mouth  at bedtime. 08/01/19  Yes [provider]  sertraline (ZOLOFT) 50 MG tablet Take 50 mg by mouth daily. 08/01/19  Yes [provider]  traZODone (DESYREL) 150 MG tablet Take 150 mg by mouth at bedtime.   Yes [provider]  doxycycline (MONODOX) 100 MG capsule Take 1 capsule (100 mg total) by mouth 2 (two) times daily. Patient not taking: Reported on 10/19/2019 08/05/19   Thayer Headings, MD  feeding supplement, GLUCERNA SHAKE, (GLUCERNA SHAKE) LIQD Take 237 mLs by mouth 2 (two) times daily between meals. Patient not taking: Reported on 10/19/2019 05/10/18   Edwin Dada, MD  insulin aspart (NOVOLOG) 100 UNIT/ML injection  Inject 0-9 Units into the skin 3 (three) times daily with meals. Patient not taking: Reported on 10/19/2019 11/02/18   Roxan Hockey, MD  loratadine (CLARITIN) 10 MG tablet Take 1 tablet (10 mg total) by mouth daily as needed for allergies, rhinitis or itching. Patient not taking: Reported on 10/19/2019 11/02/18   Roxan Hockey, MD  QUEtiapine (SEROQUEL) 200 MG tablet Take 1 tablet (200 mg total) by mouth at bedtime. Patient not taking: Reported on 10/19/2019 11/02/18   Roxan Hockey, MD  traZODone (DESYREL) 50 MG tablet Take 1 tablet (50 mg total) by mouth at bedtime as needed for sleep. Patient not taking: Reported on 10/19/2019 11/02/18   Roxan Hockey, MD   DG Chest 1 View  Result Date: 10/19/2019 CLINICAL DATA:  Chest pain EXAM: CHEST  1 VIEW COMPARISON:  October 29, 2018 FINDINGS: The heart size is stable from prior study. There is no pneumothorax or large pleural effusion. There is a small airspace opacity at the left lung base. There is no acute osseous abnormality. IMPRESSION: Small airspace opacity at the left lung base which could represent atelectasis or early infiltrate. Electronically Signed   By: Constance Holster M.D.   On: 10/19/2019 15:16   DG Foot Complete Left  Result Date: 10/19/2019 CLINICAL DATA:  Pain. EXAM: LEFT FOOT - COMPLETE 3+ VIEW COMPARISON:  June 20, 2019 FINDINGS: There are new extensive destructive changes involving the second through fourth metatarsals. There is overlying soft tissue swelling. There is a probable large plantar ulcer overlying the forefoot. The patient is status post prior transmetatarsal amputation. There is extensive new lucency through the midfoot with midfoot collapse. Findings may be secondary to disuse osteopenia or progressive Charcot foot. Osteomyelitis is not excluded. IMPRESSION: 1. Overall findings highly concerning for osteomyelitis involving the remaining portions of the second through fourth metatarsals. 2. Surrounding soft tissue  swelling with a probable large plantar ulcer. 3. Progressive destruction of the midfoot suggestive of a Charcot joint. Osteomyelitis of the cuneiforms is not excluded on this study and can be further evaluated by MRI as clinically indicated. Electronically Signed   By: Constance Holster M.D.   On: 10/19/2019 15:19   VAS [place] LOWER EXTREMITY VENOUS (DVT) ([facility] and [facility] 7a-7p)  Result Date: 10/19/2019  Lower Venous DVTStudy Indications: Pain, Swelling, Erythema, and infection, known osteomyelitis, attends wound center.  Risk Factors: Surgery Right BKA, left transmetatarsal amputation. Limitations: Patient constantly moving and retching, pain with touch in calf. Comparison Study: Prior study done 10/09/18 is available for comparison Performing Technologist: Sharion Dove RVS  Examination Guidelines: A complete evaluation includes B-mode imaging, spectral Doppler, color Doppler, and power Doppler as needed of all accessible portions of each vessel. Bilateral testing is considered an integral part of a complete examination. Limited examinations for reoccurring indications may be performed as noted. The  reflux portion of the exam is performed with the patient in reverse Trendelenburg.  Right Technical Findings: Right leg not evaluated.  +---------+---------------+---------+-----------+----------+--------------+ LEFT     CompressibilityPhasicitySpontaneityPropertiesThrombus Aging +---------+---------------+---------+-----------+----------+--------------+ CFV      Full           Yes      Yes                                 +---------+---------------+---------+-----------+----------+--------------+ SFJ      Full                                                        +---------+---------------+---------+-----------+----------+--------------+ FV Prox  Full                                                        +---------+---------------+---------+-----------+----------+--------------+ FV Mid   Full                                                         +---------+---------------+---------+-----------+----------+--------------+ FV DistalFull                                                        +---------+---------------+---------+-----------+----------+--------------+ PFV      Full                                                        +---------+---------------+---------+-----------+----------+--------------+ POP      Full           Yes                                          +---------+---------------+---------+-----------+----------+--------------+ PTV      Full                                                        +---------+---------------+---------+-----------+----------+--------------+ PERO                                                  Not visualized +---------+---------------+---------+-----------+----------+--------------+     Summary: LEFT: - There is no evidence of deep vein thrombosis in the lower extremity. However, portions of this examination were limited- see technologist comments above.  *  See table(s) above for measurements and observations.    Preliminary    US Abdomen Limited RUQ  Result Date: 10/19/2019 CLINICAL DATA:  Pain EXAM: ULTRASOUND ABDOMEN LIMITED RIGHT UPPER QUADRANT COMPARISON:  October 30, 2018 FINDINGS: Gallbladder: No gallstones or wall thickening visualized. No sonographic Murphy sign noted by sonographer. Common bile duct: Diameter: 5 mm Liver: The liver is cirrhotic with a coarsened hepatic echotexture. There is no discrete hepatic mass. There is recanalization of the umbilical vein consistent with underlying portal hypertension. Portal vein is patent on color Doppler imaging with normal direction of blood flow towards the liver. Other: None. IMPRESSION: 1. No acute abnormality.  No evidence for cholelithiasis. 2. Cirrhosis with stigmata of portal hypertension. Electronically Signed   By: Constance Holster M.D.   On:  10/19/2019 17:41   - pertinent xrays, CT, MRI studies were reviewed and independently interpreted  Positive ROS: All other systems have been reviewed and were otherwise negative with the exception of those mentioned in the HPI and as above.  Physical Exam: General: Alert, no acute distress Psychiatric: Patient is competent for consent with normal mood and affect Lymphatic: No axillary or cervical lymphadenopathy Cardiovascular: No pedal edema Respiratory: No cyanosis, no use of accessory musculature GI: No organomegaly, abdomen is soft and non-tender    Images:  @ENCIMAGES @  Labs:  Lab Results  Component Value Date   HGBA1C 6.8 (H) 10/19/2019   HGBA1C 6.6 (H) 09/28/2018   HGBA1C 7.5 (H) 05/08/2018   ESRSEDRATE 34 (H) 08/15/2019   ESRSEDRATE 31 (H) 08/05/2019   ESRSEDRATE 33 (H) 10/28/2018   CRP 15.0 (H) 08/15/2019   CRP 11.0 (H) 08/05/2019   CRP <0.8 10/28/2018   REPTSTATUS PENDING 10/19/2019   GRAMSTAIN NO WBC SEEN ABUNDANT GRAM POSITIVE COCCI  06/20/2019   CULT  10/19/2019    NO GROWTH < 12 HOURS Performed at [facility], [place] [address], [place], [place] [address]    LABORGA STAPHYLOCOCCUS AUREUS 10/04/2018   LABORGA STREPTOCOCCUS GROUP G 10/04/2018    Lab Results  Component Value Date   ALBUMIN 2.1 (L) 10/20/2019   ALBUMIN 2.6 (L) 10/19/2019   ALBUMIN 2.9 (L) 10/30/2018   PREALBUMIN 11.2 (L) 05/08/2018    Neurologic: Patient does not have protective sensation bilateral lower extremities.   MUSCULOSKELETAL:   Skin: Examination patient has cellulitis and swelling of the left foot the foot is tender to palpation he is tender to palpation over the cuboid as well as over the metatarsals.  Patient has a plantar ulcer which is superficial does not probe to bone there is no drainage.  Patient has a strong dorsalis pedis pulse he does have venous stasis swelling ankle-brachial indices a year ago showed triphasic flow with no decreased flow.  Review of  the radiographs shows destructive bony changes through all metatarsals consistent with osteomyelitis and also destructive changes of the cuboid also consistent with osteomyelitis.  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  Hemoglobin A1c 6.8 with good control, albumin 2.1.  Assessment: Assessment: Diabetic insensate neuropathy with severe protein caloric malnutrition with osteomyelitis through the midfoot and hindfoot with ulceration and cellulitis.  Plan: Discussed with the patient his best option is to proceed with a transtibial amputation risks and benefits were discussed.  Patient states he understands states he would like to have a few days to notify family.  We will plan for surgery on Wednesday.  Patient will need to be transferred to [facility] for Wednesday surgery.  Thank you  for the consult and the opportunity to see Cory Palmer, [place] [phone] 8:25 AM

## 2019-10-21 ENCOUNTER — Inpatient Hospital Stay (HOSPITAL_COMMUNITY): Payer: Medicare Other

## 2019-10-21 ENCOUNTER — Encounter (HOSPITAL_COMMUNITY): Payer: Self-pay | Admitting: Internal Medicine

## 2019-10-21 DIAGNOSIS — B9561 Methicillin susceptible Staphylococcus aureus infection as the cause of diseases classified elsewhere: Secondary | ICD-10-CM

## 2019-10-21 DIAGNOSIS — E119 Type 2 diabetes mellitus without complications: Secondary | ICD-10-CM

## 2019-10-21 DIAGNOSIS — E1169 Type 2 diabetes mellitus with other specified complication: Principal | ICD-10-CM

## 2019-10-21 DIAGNOSIS — K0889 Other specified disorders of teeth and supporting structures: Secondary | ICD-10-CM

## 2019-10-21 DIAGNOSIS — I059 Rheumatic mitral valve disease, unspecified: Secondary | ICD-10-CM

## 2019-10-21 DIAGNOSIS — E872 Acidosis, unspecified: Secondary | ICD-10-CM | POA: Diagnosis present

## 2019-10-21 DIAGNOSIS — Z89422 Acquired absence of other left toe(s): Secondary | ICD-10-CM

## 2019-10-21 DIAGNOSIS — F319 Bipolar disorder, unspecified: Secondary | ICD-10-CM

## 2019-10-21 DIAGNOSIS — R7881 Bacteremia: Secondary | ICD-10-CM | POA: Diagnosis present

## 2019-10-21 DIAGNOSIS — Z89511 Acquired absence of right leg below knee: Secondary | ICD-10-CM

## 2019-10-21 DIAGNOSIS — I480 Paroxysmal atrial fibrillation: Secondary | ICD-10-CM

## 2019-10-21 DIAGNOSIS — F17210 Nicotine dependence, cigarettes, uncomplicated: Secondary | ICD-10-CM

## 2019-10-21 DIAGNOSIS — Z8619 Personal history of other infectious and parasitic diseases: Secondary | ICD-10-CM

## 2019-10-21 DIAGNOSIS — B9562 Methicillin resistant Staphylococcus aureus infection as the cause of diseases classified elsewhere: Secondary | ICD-10-CM | POA: Diagnosis present

## 2019-10-21 DIAGNOSIS — R011 Cardiac murmur, unspecified: Secondary | ICD-10-CM

## 2019-10-21 DIAGNOSIS — I4892 Unspecified atrial flutter: Secondary | ICD-10-CM

## 2019-10-21 DIAGNOSIS — E876 Hypokalemia: Secondary | ICD-10-CM

## 2019-10-21 DIAGNOSIS — K7460 Unspecified cirrhosis of liver: Secondary | ICD-10-CM

## 2019-10-21 HISTORY — DX: Unspecified atrial flutter: I48.92

## 2019-10-21 LAB — CBC
HCT: 25.2 % — ABNORMAL LOW (ref 39.0–52.0)
Hemoglobin: 8.2 g/dL — ABNORMAL LOW (ref 13.0–17.0)
MCH: 30.3 pg (ref 26.0–34.0)
MCHC: 32.5 g/dL (ref 30.0–36.0)
MCV: 93 fL (ref 80.0–100.0)
Platelets: 207 10*3/uL (ref 150–400)
RBC: 2.71 MIL/uL — ABNORMAL LOW (ref 4.22–5.81)
RDW: 14.2 % (ref 11.5–15.5)
WBC: 8.4 10*3/uL (ref 4.0–10.5)
nRBC: 0 % (ref 0.0–0.2)

## 2019-10-21 LAB — MAGNESIUM: Magnesium: 1.6 mg/dL — ABNORMAL LOW (ref 1.7–2.4)

## 2019-10-21 LAB — GLUCOSE, CAPILLARY
Glucose-Capillary: 128 mg/dL — ABNORMAL HIGH (ref 70–99)
Glucose-Capillary: 187 mg/dL — ABNORMAL HIGH (ref 70–99)
Glucose-Capillary: 133 mg/dL — ABNORMAL HIGH (ref 70–99)

## 2019-10-21 LAB — BASIC METABOLIC PANEL
Anion gap: 8 (ref 5–15)
BUN: 6 mg/dL (ref 6–20)
CO2: 19 mmol/L — ABNORMAL LOW (ref 22–32)
Calcium: 7.6 mg/dL — ABNORMAL LOW (ref 8.9–10.3)
Chloride: 103 mmol/L (ref 98–111)
Creatinine, Ser: 0.66 mg/dL (ref 0.61–1.24)
GFR calc Af Amer: >60 mL/min (ref >60)
GFR calc non Af Amer: >60 mL/min (ref >60)
Glucose, Bld: 180 mg/dL — ABNORMAL HIGH (ref 70–99)
Potassium: 3.6 mmol/L (ref 3.5–5.1)
Sodium: 130 mmol/L — ABNORMAL LOW (ref 135–145)

## 2019-10-21 LAB — ECHOCARDIOGRAM COMPLETE
Height: 76 in
Weight: 4017.66 oz

## 2019-10-21 MED ORDER — AMOXICILLIN-POT CLAVULANATE 875-125 MG PO TABS: 1.0000 | ORAL_TABLET | Freq: Two times a day (BID) | ORAL | 0 refills | Status: DC

## 2019-10-21 MED ORDER — SULFAMETHOXAZOLE-TRIMETHOPRIM 800-160 MG PO TABS: 2.0000 | ORAL_TABLET | Freq: Two times a day (BID) | ORAL | 0 refills | Status: DC

## 2019-10-21 MED ORDER — SODIUM CHLORIDE 0.9 % IV SOLN
3.0000 g | Freq: Four times a day (QID) | INTRAVENOUS | Status: DC
  Filled 2019-10-21 (×3): qty 8

## 2019-10-21 MED ORDER — METFORMIN HCL 1000 MG PO TABS: 1000.0000 mg | ORAL_TABLET | Freq: Every day | ORAL | Status: DC

## 2019-10-21 MED ORDER — CARVEDILOL 3.125 MG PO TABS: 3.1250 mg | ORAL_TABLET | Freq: Two times a day (BID) | ORAL | 0 refills | Status: DC

## 2019-10-21 MED ORDER — SODIUM CHLORIDE 0.9 % IV SOLN
INTRAVENOUS | Status: DC | PRN
  Administered 2019-10-21: 250 mL via INTRAVENOUS

## 2019-10-21 MED ORDER — POTASSIUM CHLORIDE CRYS ER 20 MEQ PO TBCR
40.0000 meq | EXTENDED_RELEASE_TABLET | Freq: Once | ORAL | Status: AC
  Administered 2019-10-21: 40 meq via ORAL
  Filled 2019-10-21: qty 2

## 2019-10-21 MED ORDER — MAGNESIUM SULFATE 4 GM/100ML IV SOLN
4.0000 g | Freq: Once | INTRAVENOUS | Status: AC
  Administered 2019-10-21: 4 g via INTRAVENOUS
  Filled 2019-10-21: qty 100

## 2019-10-21 NOTE — Progress Notes (Signed)
  Echocardiogram 2D Echocardiogram has been performed.  Navya Timmons G Midori Dado 10/21/2019, 10:26 AM

## 2019-10-21 NOTE — Progress Notes (Signed)
PROGRESS NOTE  Cory Palmer    Code Status: Full Code  GUY:[id number] DOB: 1960/11/26 DOA: 10/19/2019 LOS: 2 days  PCP: Patient, [name] Per CC:  Chief Complaint  Patient presents with  . Foot Pain  . 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  ED Course: Initial vitals showed BP 110/70, pulse 89, RR 19, temp 98.9 Fahrenheit, SPO2 100% on room air. Labs show WBC 13.8, hemoglobin 9.4 (baseline 9.4-10.9), platelets 278,000, sodium 134, potassium 3.3, magnesium 1.6, bicarb 19, BUN 7, creatinine 0.81, serum glucose 167, AST 33, ALT 19, alk phos 201, total bilirubin 2.3, lactic acid 4.5, lipase 25.  CXR showed small opacity at the left lung base. Left foot x-ray showed findings concerning for osteomyelitis involving the remaining portions of the second through fourth metatarsals and progressive destruction of the midfoot suggestive of a Charcot joint. RUQ ultrasound showed cirrhosis with stigmata of portal hypertension without acute abnormality, no evidence of cholelithiasis, CBD diameter 5 mm. Left lower extremity venous Doppler preliminary read is negative for evidence of DVT. Patient was given 3.5 L normal saline, IV vancomycin and Zosyn, Tylenol, and IV morphine.    3/21: Evaluated by Dr. Sharol Given, Ortho, recommended transfer to [facility] for transtibial amputation on Wednesday. Blood cultures positive for staph aureus.  Remains on IV vancomycin.  Echo ordered.  Transferred to [facility]   Principal Problem:   Osteomyelitis of left foot ([place]) Active Problems:   Diabetes mellitus type 2, noninsulin dependent (HCC)   Depressed bipolar disorder (HCC)   Hypokalemia   Hypomagnesemia   PAF (paroxysmal atrial fibrillation) (HCC)   Cirrhosis of liver (HCC)   Anemia   Severe protein-calorie malnutrition (HCC)   Diabetic polyneuropathy associated with type 2 diabetes mellitus ([place])   1. Osteomyelitis of left lower extremity status post left transmetatarsal amputation 09/28/2018 a. Afebrile without leukocytosis on room air b. Continue vancomycin/ceftriaxone c. Per orthopedic surgery: Recommend transfer to [facility] for transtibial amputation to be done on Wednesday 2. Staph aureus bacteremia a. Continue IV vancomycin for now b. Repeated blood cultures 10/20/19 no growth to date c. Echo to rule out endocarditis pending.  May need TEE.   d. ID consult per protocol 3. Paroxysmal atrial fibrillation, atrial flutter, currently rate controlled a. A flutter on presenting EKG  b. Continue carvedilol 3.125 mg twice daily started this admission c. Goal K> 4.0, goal Mg > 2.0  d. Anticoagulation on hold until can safely start after surgery 4. Hypokalemia, repleted 5. Hypomagnesemia, replete 6. Hyponatremia - IV NS ordered.  7. Compensated liver cirrhosis a. Negative hepatitis panel in March 2020 8. Normocytic anemia - Hg stable, following.  9. Type 2 diabetes a. Holding home Metformin and continuing sliding scale 10. Bipolar disorder on home Seroquel, Zoloft and trazodone  DVT prophylaxis: Lovenox, SCDs Family Communication: pt prefers to update his brother, updated patient at bedside, verbalized understanding Disposition Plan:   Patient came from:   Home, lives alone and ambulates with a cane  Anticipated d/c place: TBD  Barriers to d/c: Patient requiring IV antibiotics and has upcoming surgery on  Wednesday  Pressure injury documentation    None  Consultants  Ortho  Procedures  None  Antibiotics   Anti-infectives (From admission, onward)   Start     Dose/Rate Route Frequency Ordered Stop   10/20/19 0600  vancomycin (VANCOREADY) IVPB 1250 mg/250 mL     1,250 mg 166.7 mL/hr over 90 Minutes Intravenous Every 12 hours 10/19/19 1957     10/19/19 2200  cefTRIAXone (ROCEPHIN) 2 g in sodium chloride 0.9 % 100 mL IVPB     2 g 200 mL/hr over 30 Minutes Intravenous Every 24 hours 10/19/19 1906     10/19/19 1700  vancomycin (VANCOCIN) IVPB 1000 mg/200 mL premix  Status:  Discontinued     1,000 mg 200 mL/hr over 60 Minutes Intravenous  Once 10/19/19 1648 10/19/19 1651   10/19/19 1700  vancomycin (VANCOREADY) IVPB 2000 mg/400 mL     2,000 mg 200 mL/hr over 120 Minutes Intravenous  Once 10/19/19 1651 10/19/19 1924   10/19/19 1430  piperacillin-tazobactam (ZOSYN) IVPB 3.375 g     3.375 g 100 mL/hr over 30 Minutes Intravenous  Once 10/19/19 1428 10/19/19 1644     Subjective   Patient remains agreeable to having surgery on Wed as scheduled with Dr. Sharol Given.   Objective   Vitals:   10/20/19 2125 10/21/19 0301 10/21/19 0808 10/21/19 0812  BP:  112/70 120/76 110/74  Pulse:  85 88 77  Resp:    17  Temp:  98.3 F (36.8 C)  97.9 F (36.6 C)  TempSrc:  Oral  Oral  SpO2:  95%  97%  Weight: 113.9 kg     Height:        Intake/Output Summary (Last 24 hours) at 10/21/2019 0846 Last data filed at 10/21/2019 0810 Gross per 24 hour  Intake 1073 ml  Output -  Net 1073 ml   Filed Weights   10/20/19 2125  Weight: 113.9 kg    Examination: Gen - awake, alert, cooperative, NAD.  HEENT - NCAT. Sclera white.  Neck - supple, no JVD CHEST - lungs CTA.  CV - normal s1,s2 sounds.  ABD - soft, normal BS  EXT - right BKA, left foot cellulitis and edema w/ good left pedal pulse NEURO/PSYCH -- insensate left foot  Data Reviewed: I have personally reviewed following labs and imaging studies   CBC: Recent Labs  Lab 10/19/19 1451 10/20/19 0504 10/21/19 0228  WBC 13.8* 10.2 8.4  NEUTROABS 11.4*  --   --   HGB 9.4* 8.9* 8.2*  HCT 29.0* 27.3* 25.2*  MCV 94.2 93.8 93.0  PLT 278 220 627   Basic Metabolic Panel: Recent Labs  Lab 10/19/19 1451 10/19/19 1716 10/20/19 0504 10/21/19 0228  NA 134*  --  132* 130*  K 3.3*  --  3.4* 3.6  CL 101  --  104 103  CO2 19*  --  19* 19*  GLUCOSE 167*  --  144* 180*  BUN 7  --  6 6  CREATININE 0.81  --  0.58* 0.66  CALCIUM 8.2*  --  7.6* 7.6*  MG  --  1.6* 1.8 1.6*   GFR: Estimated Creatinine Clearance: 137.3 mL/min (by C-G formula based on SCr of 0.66 mg/dL). Liver Function Tests: Recent Labs  Lab 10/19/19 1451 10/20/19 0504  AST 33 24  ALT 19 16  ALKPHOS 201* 170*  BILITOT 2.3* 2.1*  PROT 7.5  6.3*  ALBUMIN 2.6* 2.1*   Recent Labs  Lab 10/19/19 1657  LIPASE 25   No results for input(s): AMMONIA in the last 168 hours. Coagulation Profile: No results for input(s): INR, PROTIME in the last 168 hours. Cardiac Enzymes: No results for input(s): CKTOTAL, CKMB, CKMBINDEX, TROPONINI in the last 168 hours. BNP (last 3 results) No results for input(s): PROBNP in the last 8760 hours. HbA1C: Recent Labs    10/19/19 1915  HGBA1C 6.8*   CBG: Recent Labs  Lab 10/20/19 0803 10/20/19 1211 10/20/19 1724 10/20/19 2101 10/21/19 0646  GLUCAP 128* 176* 151* 145* 128*   Lipid Profile: No results for input(s): CHOL, HDL, LDLCALC, TRIG, CHOLHDL, LDLDIRECT in the last 72 hours. Thyroid Function Tests: No results for input(s): TSH, T4TOTAL, FREET4, T3FREE, THYROIDAB in the last 72 hours. Anemia Panel: No results for input(s): VITAMINB12, FOLATE, FERRITIN, TIBC, IRON, RETICCTPCT in the last 72 hours. Sepsis Labs: Recent Labs  Lab 10/19/19 1451 10/19/19 1804  LATICACIDVEN 4.5* 2.5*    Recent Results (from the past 240 hour(s))  Blood culture (routine x 2)     Status: Abnormal (Preliminary result)   Collection Time:  10/19/19  2:51 PM   Specimen: BLOOD RIGHT FOREARM  Result Value Ref Range Status   Specimen Description   Final    BLOOD RIGHT FOREARM Performed at [facility], [place] [address], [place], [place] [address]    Special Requests   Final    BOTTLES DRAWN AEROBIC AND ANAEROBIC Blood Culture adequate volume Performed at [place] [address] [place], [place] [address]    Culture  Setup Time   Final    GRAM POSITIVE COCCI IN CLUSTERS IN BOTH AEROBIC AND ANAEROBIC BOTTLES CRITICAL RESULT CALLED TO, READ BACK BY AND VERIFIED WITH: [place] [id number] AT 9150 BY CM Performed at [facility], [place] [address], [place],  [address]    Culture STAPHYLOCOCCUS AUREUS (A)  Final   Report Status PENDING  Incomplete  Respiratory Panel by RT PCR (Flu A&B, Covid) - Nasopharyngeal Swab     Status: None   Collection Time: 10/19/19  8:07 PM   Specimen: Nasopharyngeal Swab  Result Value Ref Range Status   SARS Coronavirus 2 by RT PCR NEGATIVE NEGATIVE Final    Comment: (NOTE) SARS-CoV-2 target nucleic acids are NOT DETECTED. The SARS-CoV-2 RNA is generally detectable in upper respiratoy specimens during the acute phase of infection. The lowest concentration of SARS-CoV-2 viral copies this assay can detect is 131 copies/mL. A negative result does not preclude SARS-Cov-2 infection and should not be used as the sole basis for treatment or other patient management decisions. A negative result may occur with  improper specimen collection/handling, submission of specimen other than nasopharyngeal swab, presence of viral mutation(s) within the areas targeted by this assay, and inadequate number of viral copies (<131 copies/mL). A negative result must be combined with clinical observations, patient history, and epidemiological information. The expected result is Negative. Fact Sheet for Patients:  PinkCheek.be Fact Sheet for  Healthcare Providers:  GravelBags.it This test is not yet ap proved or cleared by the [place] FDA and  has been authorized for detection and/or diagnosis of SARS-CoV-2 by FDA under an Emergency Use Authorization (EUA). This EUA will remain  in effect (meaning this test can be used) for the duration of the COVID-19 declaration under Section 564(b)(1) of the Act, 21 U.S.C. section 360bbb-3(b)(1), unless the authorization is terminated or revoked  sooner.    Influenza A by PCR NEGATIVE NEGATIVE Final   Influenza B by PCR NEGATIVE NEGATIVE Final    Comment: (NOTE) The Xpert Xpress SARS-CoV-2/FLU/RSV assay is intended as an aid in  the diagnosis of influenza from Nasopharyngeal swab specimens and  should not be used as a sole basis for treatment. Nasal washings and  aspirates are unacceptable for Xpert Xpress SARS-CoV-2/FLU/RSV  testing. Fact Sheet for Patients: PinkCheek.be Fact Sheet for Healthcare Providers: GravelBags.it This test is not yet approved or cleared by the [place] FDA and  has been authorized for detection and/or diagnosis of SARS-CoV-2 by  FDA under an Emergency Use Authorization (EUA). This EUA will remain  in effect (meaning this test can be used) for the duration of the  Covid-19 declaration under Section 564(b)(1) of the Act, 21  U.S.C. section 360bbb-3(b)(1), unless the authorization is  terminated or revoked. Performed at [facility], [place] [address] [place], [place] [address]      Radiology Studies: DG Chest 1 View  Result Date: 10/19/2019 CLINICAL DATA:  Chest pain EXAM: CHEST  1 VIEW COMPARISON:  October 29, 2018 FINDINGS: The heart size is stable from prior study. There is no pneumothorax or large pleural effusion. There is a small airspace opacity at the left lung base. There is no acute osseous abnormality. IMPRESSION: Small airspace opacity at  the left lung base which could represent atelectasis or early infiltrate. Electronically Signed   By: Constance Holster M.D.   On: 10/19/2019 15:16   DG Foot Complete Left  Result Date: 10/19/2019 CLINICAL DATA:  Pain. EXAM: LEFT FOOT - COMPLETE 3+ VIEW COMPARISON:  June 20, 2019 FINDINGS: There are new extensive destructive changes involving the second through fourth metatarsals. There is overlying soft tissue swelling. There is a probable large plantar ulcer overlying the forefoot. The patient is status post prior transmetatarsal amputation. There is extensive new lucency through the midfoot with midfoot collapse. Findings may be secondary to disuse osteopenia or progressive Charcot foot. Osteomyelitis is not excluded. IMPRESSION: 1. Overall findings highly concerning for osteomyelitis involving the remaining portions of the second through fourth metatarsals. 2. Surrounding soft tissue swelling with a probable large plantar ulcer. 3. Progressive destruction of the midfoot suggestive of a Charcot joint. Osteomyelitis of the cuneiforms is not excluded on this study and can be further evaluated by MRI as clinically indicated. Electronically Signed   By: Constance Holster M.D.   On: 10/19/2019 15:19   VAS [place] LOWER EXTREMITY VENOUS (DVT) (MC and WL 7a-7p)  Result Date: 10/20/2019  Lower Venous DVTStudy Indications: Pain, Swelling, Erythema, and infection, known osteomyelitis, attends wound center.  Risk Factors: Surgery Right BKA, left transmetatarsal amputation. Limitations: Patient constantly moving and retching, pain with touch in calf. Comparison Study: Prior study done 10/09/18 is available for comparison Performing Technologist: Sharion Dove RVS  Examination Guidelines: A complete evaluation includes B-mode imaging, spectral Doppler, color Doppler, and power Doppler as needed of all accessible portions of each vessel. Bilateral testing is considered an integral part of a complete examination.  Limited examinations for reoccurring indications may be performed as noted. The reflux portion of the exam is performed with the patient in reverse Trendelenburg.  Right Technical Findings: Right leg not evaluated.  +---------+---------------+---------+-----------+----------+--------------+ LEFT     CompressibilityPhasicitySpontaneityPropertiesThrombus Aging +---------+---------------+---------+-----------+----------+--------------+ CFV      Full           Yes      Yes                                 +---------+---------------+---------+-----------+----------+--------------+  SFJ      Full                                                        +---------+---------------+---------+-----------+----------+--------------+ FV Prox  Full                                                        +---------+---------------+---------+-----------+----------+--------------+ FV Mid   Full                                                        +---------+---------------+---------+-----------+----------+--------------+ FV DistalFull                                                        +---------+---------------+---------+-----------+----------+--------------+ PFV      Full                                                        +---------+---------------+---------+-----------+----------+--------------+ POP      Full           Yes                                          +---------+---------------+---------+-----------+----------+--------------+ PTV      Full                                                        +---------+---------------+---------+-----------+----------+--------------+ PERO                                                  Not visualized +---------+---------------+---------+-----------+----------+--------------+     Summary: LEFT: - Findings appear essentially unchanged compared to previous examination. - There is no evidence of deep vein  thrombosis in the lower extremity. However, portions of this examination were limited- see technologist comments above.  *See table(s) above for measurements and observations. Electronically signed by Ruta Hinds MD on 10/20/2019 at 12:37:42 PM.    Final    US Abdomen Limited RUQ  Result Date: 10/19/2019 CLINICAL DATA:  Pain EXAM: ULTRASOUND ABDOMEN LIMITED RIGHT UPPER QUADRANT COMPARISON:  October 30, 2018 FINDINGS: Gallbladder: No gallstones or wall thickening visualized. No sonographic Murphy sign noted by sonographer. Common bile duct: Diameter: 5 mm Liver: The liver is cirrhotic with a coarsened  hepatic echotexture. There is no discrete hepatic mass. There is recanalization of the umbilical vein consistent with underlying portal hypertension. Portal vein is patent on color Doppler imaging with normal direction of blood flow towards the liver. Other: None. IMPRESSION: 1. No acute abnormality.  No evidence for cholelithiasis. 2. Cirrhosis with stigmata of portal hypertension. Electronically Signed   By: Constance Holster M.D.   On: 10/19/2019 17:41    Scheduled Meds: . carvedilol  3.125 mg Oral BID WC  . enoxaparin (LOVENOX) injection  40 mg Subcutaneous Q24H  . insulin aspart  0-9 Units Subcutaneous TID WC  . QUEtiapine  300 mg Oral QHS  . sertraline  50 mg Oral Daily  . sodium chloride flush  3 mL Intravenous Q12H  . traZODone  150 mg Oral QHS   Continuous Infusions: . cefTRIAXone (ROCEPHIN)  IV 2 g (10/20/19 2256)  . vancomycin 1,250 mg (10/21/19 1779)   Time spent: 30 minutes with over 50% of the time coordinating the patient's care  Irwin Brakeman, MD [facility] How to contact the [facility] Attending or Consulting provider [place] or covering provider during after hours [place], for this patient?  1. Check the care team in [facility] and look for a) attending/consulting [facility] provider listed and b) the [facility] team listed 2. Log into [email or web address] and use [place]'s universal password to  access. If you do not have the password, please contact the hospital operator. 3. Locate the [facility] provider you are looking for under [facility] and page to a number that you can be directly reached. 4. If you still have difficulty reaching the provider, please page the [facility] (Director on Call) for the Hospitalists listed on amion for assistance.   Call night coverage person covering after 7pm

## 2019-10-21 NOTE — Progress Notes (Signed)
Patient has a history of right transtibial amputation.  He now has diffuse osteomyelitis with destructive bony changes in his left foot.  Dr. [name] consulted on the weekend and recommended a left below-knee amputation which we had planned for this coming Wednesday.  Patient states he needs a few days is asking to be discharged.  He says he has things to take care of at home and it is all just happening too fast for him.  When he is questioned today he said the changes in his foot has been going on for a while and talks about that he went to the emergency room because he needed refills on his antidepressants.  Focused exam unchanged swelling from the lower leg down into the foot pulses are palpable   I had a long discussion with the patient.  I told him if he did not have surgery with Dr. [name] this Wednesday the next opportunity would be in 1 week as Dr. [name] is going out of town.  He is fairly insistent that he cannot have surgery for a few days.  He understands that we can certainly do his surgery a week from Wednesday but that he is taking a risk of further progression of his infection and life-threatening sepsis.  If his symptoms progressed without surgery on Wednesday he understands he may have to have an emergent procedure at some point before Dr. [name] returns I will discuss this with Dr. [name] but at this point patient wishes to be discharged but is very amenable to surgery a week from Wednesday

## 2019-10-21 NOTE — TOC Initial Note (Signed)
Transition of Care [facility]) - Initial/Assessment Note    Patient Details  Name: Cory Palmer MRN: [id number] Date of Birth: [birth year]  [facility]) CM/SW Contact:    Curlene Labrum, RN Phone Number: 10/21/2019, 1:04 PM  Clinical Narrative:     59 year old male admitted with left foot wound with plan for BKA on 3/24. 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  Patient currently lives alone in a downstairs apartment.  Patient uses [employer] services for transportation.  Patient is waiting on continued medical workup for IV antibiotics, probable PICC line and surgery with Dr. Sharol Given scheduled for 10/23/2019.  Patient will need rolling walker with 5 in wheels after surgery as recommended by PT.  Patient is anxious for surgery considering that he needs to return home to resolve issues with bills at home and coordinating finances while he is awaiting surgery.  Patient states he was no expecting surgery so soon and is anxious to return home.  Barriers to discharge include - surgery and medical work up, DME Librarian, academic) needed pending d/c home or SNF placement, and transporation due to nature of the patient's surgery.     Expected Discharge Plan: Skilled Nursing Facility Barriers to Discharge: Ship broker, Continued Medical Work up, Transportation   Patient Goals and CMS Choice Patient states their goals for this hospitalization and ongoing recovery are:: I'm waiting to have my surgery on my right leg with Dr. Sharol Given.      Expected Discharge Plan and Services Expected Discharge Plan: [place]   Discharge Planning Services: CM Consult   Living arrangements for the past 2 months: Apartment                                      Prior Living Arrangements/Services Living arrangements for the past 2  months: Apartment Lives with:: Self Patient language and need for interpreter reviewed:: Yes Do you feel safe going back to the place where you live?: Yes      Need for Family Participation in Patient Care: Yes (Comment) Care giver support system in place?: No (comment)(Patient lives alone.  Pending Right BKA surgery)   Criminal Activity/Legal Involvement Pertinent to Current Situation/Hospitalization: No - Comment as needed  Activities of Daily Living Home Assistive Devices/Equipment: None ADL Screening (condition at time of admission) Patient's cognitive ability adequate to safely complete daily activities?: No Is the patient deaf or have difficulty hearing?: No Does the patient have difficulty seeing, even when wearing glasses/contacts?: No Does the patient have difficulty concentrating, remembering, or making decisions?: No Patient able to express need for assistance with ADLs?: Yes Does the patient have difficulty dressing or bathing?: No Independently performs ADLs?: Yes (appropriate for developmental age) Does the patient have difficulty walking or climbing stairs?: Yes Weakness of Legs: Both Weakness of Arms/Hands: None  Permission Sought/Granted Permission sought to share information with : Case Manager Permission granted to share information with : Yes, Verbal Permission Granted              Emotional Assessment Appearance:: Appears older than stated age Attitude/Demeanor/Rapport: Gracious Affect (typically observed): Accepting Orientation: : Oriented to Self, Oriented to Place, Oriented to  Time, Oriented to Situation Alcohol / Substance Use: Tobacco Use, Alcohol Use Psych Involvement: No (comment)  Admission diagnosis:  Nausea [R11.0]  Abnormal EKG [R94.31] Osteomyelitis of left foot (HCC) [M86.9] Abdominal pain [R10.9] Osteomyelitis of left foot, unspecified type (HCC) [M86.9] Sepsis, due to unspecified organism, unspecified whether acute organ dysfunction  present [facility]) [A41.9] Patient Active Problem List   Diagnosis Date Noted  . Staphylococcus aureus bacteremia 10/21/2019  . Atrial flutter with rapid ventricular response (HCC) 10/21/2019  . Lactic acidosis 10/21/2019  . Severe protein-calorie malnutrition (HCC)   . Diabetic polyneuropathy associated with type 2 diabetes mellitus (HCC)   . Osteomyelitis of left foot (HCC) 10/19/2019  . Cirrhosis of liver (HCC) 10/19/2019  . Anemia 10/19/2019  . Medication monitoring encounter 08/15/2019  . Serum total bilirubin elevated 10/29/2018  . BPH (benign prostatic hyperplasia) 10/29/2018  . Non-pressure chronic ulcer of other part of left foot limited to breakdown of skin (HCC)   . Below-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)   . Hx of BKA, right (HCC)   . PAF (paroxysmal atrial fibrillation) (HCC)   . Hypokalemia   . Hypomagnesemia   . Sepsis without acute organ dysfunction (HCC) 10/05/2018  . Depressed bipolar disorder (HCC) 10/05/2018  . Hypertension 10/05/2018  . Hyperbilirubinemia 10/05/2018  . S/P transmetatarsal amputation of foot, 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  . Wound infection 05/07/2018  . Homelessness 05/07/2018  . Diabetes mellitus type 2, noninsulin dependent (HCC) 05/07/2018  . Acute osteomyelitis involving ankle and foot, left (HCC) 05/07/2018   PCP:  Patient, [name] Per Pharmacy:   [facility] - [place], [place] - [address] [place] [address] Phone: [phone] Fax: [phone]  [facility] [phone] - [place], [place] - [address] AT [facility] [address] [address] Phone: [phone] Fax: [phone]  [facility] - [place], [place] - [address] [place] [address] Phone: [phone] Fax: [phone]     Social Determinants of Health (SDOH) Interventions    Readmission Risk  Interventions Readmission Risk Prevention Plan 10/21/2019  Transportation Screening Complete  PCP or Specialist Appt within 5-7 Days Complete  Home Care Screening Complete  Medication Review (RN CM) Complete  Some recent data might be hidden

## 2019-10-21 NOTE — Progress Notes (Signed)
Patient states he will have to leave today to take care of some things at home. RN educated on the need for antibiotics. He says that he could come back in a few days for the surgery.

## 2019-10-21 NOTE — Progress Notes (Addendum)
1645:  Pt states he will be leaving AMA at 1730.   Notified Infectious Disease NP Dixon via amion and [employer]. MD via pager. NP stated she would set up ABX for home. AMA form signed by pt and pt denied any further questions or concerns. Telemtry and IV's removed. Contact information in Epic is most up to date.   Renaldo Reel, MD at 1728 and left voicemail.

## 2019-10-21 NOTE — Consult Note (Addendum)
[facility]    Date of Admission:  10/19/2019     Total days of antibiotics 3  Day 3 Vancomycin + Ceftriaxone             Reason for Consult: Staphylococcus Aureus Bacteremia   Referring Provider: CHAMP autoconsult  Primary Care Provider: Patient, [name] Per    Assessment: Cory Palmer is a 59 y.o. diabetic male with history of liver cirrhosis here for evaluation of wound on the left foot, s/p transmetatarsal amputation 09-2018. He is now found to have staphylococcal bacteremia in 2/4 bottles awaiting sensitivities. His wound per photo shows chronic ulceration with callous around periwound; previously this was responding well to doxycycline per outpatient notes.   He feels well overall which is making it hard for him to understand the severity of his problem. Attempted to convey that he has a very aggressive bacteria in his blood stream and would recommend delay in discharge at least until we can place a PICC line, know antibiotic susceptibilities and time to organize home health. A major concern for him today is that he has many bills to pay, clothes to put away at home and needs to speak to his landlord - he also wants a cigarette quite badly.   He has no signs of metastatic infection at this time and no hardware in place. Will have him get Echo done then come back to speak with him again. I emphasized many times during the conversation he needs IV therapy for this infection. Unfortunately with antidepressants and other medications he would not be a safe linezolid candidate.     Plan: 1. Would continue vancomycin 2. Would broaden ceftriaxone to Unasyn to include anaerobic coverage  3. TTE being done now  4. Surgery plans for L BKA underway 5. Will need PICC line and coordination of home antibiotics.      ADDENDUM: 1:40 PM  Patient notified his nurse that he likely will not opt for [facility] line. Given heavy exposure to Doxycycline in the past will  offer combination of Bactrim + Augmentin to treat as a strategy of harm reduction. Awaiting TTE results and will discuss with Mr. Costabile again.     Principal Problem:   Staphylococcus aureus bacteremia Active Problems:   Osteomyelitis of left foot (HCC)   Cirrhosis of liver (HCC)   Diabetes mellitus type 2, noninsulin dependent (HCC)   Depressed bipolar disorder (HCC)   Hypokalemia   Hypomagnesemia   PAF (paroxysmal atrial fibrillation) (HCC)   Anemia   Severe protein-calorie malnutrition (HCC)   Diabetic polyneuropathy associated with type 2 diabetes mellitus (HCC)   Atrial flutter with rapid ventricular response (HCC)   Lactic acidosis   . carvedilol  3.125 mg Oral BID WC  . enoxaparin (LOVENOX) injection  40 mg Subcutaneous Q24H  . insulin aspart  0-9 Units Subcutaneous TID WC  . potassium chloride  40 mEq Oral Once  . QUEtiapine  300 mg Oral QHS  . sertraline  50 mg Oral Daily  . sodium chloride flush  3 mL Intravenous Q12H  . traZODone  150 mg Oral QHS     HPI: Cory Palmer is a 59 y.o. male admitted 10/19/19 from Home to evaluate a left foot wound.   PMHx - T2DM, liver cirrhosis, paroxysmal atrial fibrillarion (no AC d/t anemia and patient refusing), s/p R BKA, bipolar disorder, osteomyelitis of the left foot s/p transmetatarsal amputation 09/28/18.  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   In the ER he was found to have leukocytosis 13.8K, PLT 278K, Cr 0.81, TBili 2.3, Lactic acid 4.5. Left foot xrays revealed findings concerning for osteomyelitis involving the remaining portions of the second through fourth metatarsals and progressive destruction of the midfoot. Blood cultures collected on 10/19/2019 reveal now staphylococcus aureus with sensitivities pending in 2/4 bottles. He is   Last saw our partner Dr. Linus Salmons outpatient on January 14th 2021 where he  was on chronic Doxycycline and instructed to continue through next appointment, however he did not show on 09/17/2019 at follow up. Antibiotic Rx last provided was in January and would have lasted him probably through mid/late February if taken as prescribed. He missed this appointment because he was trying to get teeth pulled/dentures placed.   He states that he needs to leave today to go home to attend to a few things before scheduled surgery. He was feeling overall well when he was admitted and no different now. He states that he cannot recall exactly when he stopped his antibiotics. He was aware that he has bacteria in his blood stream but does not see exactly why this makes his leaving today any more problematic nor why that should delay things. He denies any hardware/cardiac devices. No other sites that are painful and specifically denies any back pain, joint pain or otherwise. He is currently scheduled for transtibial amputation on 3/24 with Dr. Sharol Given however he informed the orthopedic team today that he cannot wait for this and has to attend to several things at home. Looking at rescheduling out 2 more weeks.    Lab Results  Component Value Date   HGBA1C 6.8 (H) 10/19/2019    Review of Systems: Review of Systems  Constitutional: Negative for chills, fever and malaise/fatigue.  HENT: Negative for tinnitus.   Eyes: Negative for blurred vision and photophobia.  Respiratory: Negative for cough and sputum production.   Cardiovascular: Negative for chest pain.  Gastrointestinal: Negative for diarrhea, nausea and vomiting.  Genitourinary: Negative for dysuria.  Musculoskeletal: Negative for back pain, joint pain and neck pain.  Skin: Negative for rash.       Wound on the left plantar foot  Neurological: Negative for weakness and headaches.    Past Medical History:  Diagnosis Date  . Anxiety   . Depressed bipolar disorder ([place])   . Diabetes mellitus without complication ([place])   .  Hypertension   . Osteomyelitis of toe of left foot (HCC)     Social History   Tobacco Use  . Smoking status: Current Every Day Smoker    Types: Cigarettes  . Smokeless tobacco: Never Used  Substance Use Topics  . Alcohol use: Yes    Comment: occasional  . Drug use: Yes    Types: Cocaine    Family History  Problem Relation Age of Onset  . Hypertension Other   . Diabetes Mellitus II Other    No Known Allergies  OBJECTIVE: Blood pressure 110/74, pulse 77, temperature 97.9 F (36.6 C), temperature source Oral, resp. rate 17, height 6\' 4"  (1.93 m), weight 113.9 kg, SpO2 97 %.  Physical Exam Vitals and nursing note reviewed.  Constitutional:      Appearance: Normal appearance.  HENT:     Mouth/Throat:     Mouth: Mucous membranes are moist.     Pharynx: Oropharynx is  clear.     Comments: Poor dentition  Eyes:     General: No scleral icterus. Cardiovascular:     Rate and Rhythm: Normal rate. Rhythm irregular.     Heart sounds: Murmur: 1/6 systolic murmur at apex.  Pulmonary:     Effort: Pulmonary effort is normal.     Breath sounds: Normal breath sounds.  Abdominal:     General: There is no distension.     Palpations: Abdomen is soft.     Tenderness: There is no abdominal tenderness.  Musculoskeletal:        General: Normal range of motion.     Cervical back: Normal range of motion.  Skin:    General: Skin is warm and dry.     Comments: L foot is wrapped in kerlix. Some serous/clear drainage making bed wet.   Neurological:     Mental Status: He is alert and oriented to person, place, and time.       Lab Results Lab Results  Component Value Date   WBC 8.4 10/21/2019   HGB 8.2 (L) 10/21/2019   HCT 25.2 (L) 10/21/2019   MCV 93.0 10/21/2019   PLT 207 10/21/2019    Lab Results  Component Value Date   CREATININE 0.66 10/21/2019   BUN 6 10/21/2019   NA 130 (L) 10/21/2019   K 3.6 10/21/2019   CL 103 10/21/2019   CO2 19 (L) 10/21/2019    Lab Results    Component Value Date   ALT 16 10/20/2019   AST 24 10/20/2019   ALKPHOS 170 (H) 10/20/2019   BILITOT 2.1 (H) 10/20/2019     Microbiology: Recent Results (from the past 240 hour(s))  Blood culture (routine x 2)     Status: Abnormal (Preliminary result)   Collection Time: 10/19/19  2:51 PM   Specimen: BLOOD RIGHT FOREARM  Result Value Ref Range Status   Specimen Description   Final    BLOOD RIGHT FOREARM Performed at [facility], [address], [place], [place] [address]    Special Requests   Final    BOTTLES DRAWN AEROBIC AND ANAEROBIC Blood Culture adequate volume Performed at [facility], [address] [place], [place] [address]    Culture  Setup Time   Final    GRAM POSITIVE COCCI IN CLUSTERS IN BOTH AEROBIC AND ANAEROBIC BOTTLES CRITICAL RESULT CALLED TO, READ BACK BY AND VERIFIED WITH: PHARMD Damaris Hippo [id number] AT 1124 BY CM Performed at [facility], [address], [place], [place] [address]    Culture STAPHYLOCOCCUS AUREUS (A)  Final   Report Status PENDING  Incomplete  Respiratory Panel by RT PCR (Flu A&B, Covid) - Nasopharyngeal Swab     Status: None   Collection Time: 10/19/19  8:07 PM   Specimen: Nasopharyngeal Swab  Result Value Ref Range Status   SARS Coronavirus 2 by RT PCR NEGATIVE NEGATIVE Final    Comment: (NOTE) SARS-CoV-2 target nucleic acids are NOT DETECTED. The SARS-CoV-2 RNA is generally detectable in upper respiratoy specimens during the acute phase of infection. The lowest concentration of SARS-CoV-2 viral copies this assay can detect is 131 copies/mL. A negative result does not preclude SARS-Cov-2 infection and should not be used as the sole basis for treatment or other patient management decisions. A negative result may occur with  improper specimen collection/handling, submission of specimen other than nasopharyngeal swab, presence of viral mutation(s) within the areas targeted by this assay, and inadequate  number of  viral copies (<131 copies/mL). A negative result must be combined with clinical observations, patient history, and epidemiological information. The expected result is Negative. Fact Sheet for Patients:  https://www.moore.com/ Fact Sheet for Healthcare Providers:  https://www.young.biz/ This test is not yet ap proved or cleared by the [place] FDA and  has been authorized for detection and/or diagnosis of SARS-CoV-2 by FDA under an Emergency Use Authorization (EUA). This EUA will remain  in effect (meaning this test can be used) for the duration of the COVID-19 declaration under Section 564(b)(1) of the Act, 21 U.S.C. section 360bbb-3(b)(1), unless the authorization is terminated or revoked sooner.    Influenza A by PCR NEGATIVE NEGATIVE Final   Influenza B by PCR NEGATIVE NEGATIVE Final    Comment: (NOTE) The Xpert Xpress SARS-CoV-2/FLU/RSV assay is intended as an aid in  the diagnosis of influenza from Nasopharyngeal swab specimens and  should not be used as a sole basis for treatment. Nasal washings and  aspirates are unacceptable for Xpert Xpress SARS-CoV-2/FLU/RSV  testing. Fact Sheet for Patients: https://www.moore.com/ Fact Sheet for Healthcare Providers: https://www.young.biz/ This test is not yet approved or cleared by the [place] FDA and  has been authorized for detection and/or diagnosis of SARS-CoV-2 by  FDA under an Emergency Use Authorization (EUA). This EUA will remain  in effect (meaning this test can be used) for the duration of the  Covid-19 declaration under Section 564(b)(1) of the Act, 21  U.S.C. section 360bbb-3(b)(1), unless the authorization is  terminated or revoked. Performed at [facility], [address] [place], [place] [address]     Rexene Alberts, MSN, NP-C [facility] Cell:  [phone] Pager: [phone]  10/21/2019 10:14 AM

## 2019-10-21 NOTE — Plan of Care (Signed)
  Problem: Education: Goal: Knowledge of General Education information will improve Description: Including pain rating scale, medication(s)/side effects and non-pharmacologic comfort measures Outcome: Progressing   Problem: Health Behavior/Discharge Planning: Goal: Ability to manage health-related needs will improve Outcome: Progressing   Problem: Clinical Measurements: Goal: Will remain free from infection Outcome: Progressing   Problem: Coping: Goal: Level of anxiety will decrease Outcome: Progressing   Problem: Pain Managment: Goal: General experience of comfort will improve Outcome: Progressing   Problem: Safety: Goal: Ability to remain free from injury will improve Outcome: Progressing   Problem: Skin Integrity: Goal: Risk for impaired skin integrity will decrease Outcome: Progressing   

## 2019-10-21 NOTE — Discharge Summary (Signed)
DISCHARGE SUMMARY  PATIENT DISCHARGING AGAINST MEDICAL ADVICE 10/21/2019 5:14 PM   Capacity Evaluation by Dr. Wynetta Emery 10/21/2019 5:02 PM  Patient  does appear to have the capacity to make an informed decision to refuse treatment.     - Patient is able to express a choice to either accept or refuse medications.    - Patient does have an understanding of the nature of the medication, its risks, benefits, and/or alternatives and still refuses treatment.    - Patient does have an appreciation of the nature of his/her current circumstances and still refuses further inpatient treatment despite risks of death.  I explained high mortality associated with staph aureus bacteremia and he verbalized understanding.   - Patient is able to rationally manipulate information to make an informed decision due to   PLEASE SEE PROGRESS NOTES DATED 10/21/2019  PATIENT DISCHARGING [place].  HE IS HIGH RISK FOR ADVERSE OUTCOME AND DEATH. HE WAS DETERMINED TO HAVE CAPACITY TO MAKE INFORMED DECISIONS AND CONTINUES TO REFUSE TREATMENT.    INFECTIOUS DISEASE SERVICE HAS CALLED IN 2 ORAL ANTIBIOTIC PRESCRIPTIONS FOR HIM TO PICK UP AT HOSPITAL TO TREAT STAPH AUREUS BACTEREMIA AND OSTEOMYELITIS.  PT REFUSES PICC LINE AND REFUSES TEE.   HE SAYS HE WILL RETURN FOR SURGERY WITH DR DUDA AT A LATER UNDETERMINED TIME AND DATE.    Principal Problem:   Staphylococcus aureus bacteremia Active Problems:   Diabetes mellitus type 2, noninsulin dependent (HCC)   S/P transmetatarsal amputation of foot, left (HCC)   Depressed bipolar disorder (HCC)   Hypokalemia   Hypomagnesemia   PAF (paroxysmal atrial fibrillation) ([place])   Osteomyelitis of left foot ([place])   Cirrhosis of liver (HCC)   Anemia   Severe protein-calorie malnutrition (HCC)   Diabetic polyneuropathy associated with type 2 diabetes mellitus (HCC)   Atrial flutter with rapid ventricular response (HCC)   Lactic acidosis   Allergies as of 10/21/2019    No Known Allergies     Medication List    STOP taking these medications   doxycycline 100 MG capsule Commonly known as: MONODOX   feeding supplement (GLUCERNA SHAKE) Liqd   insulin aspart 100 UNIT/ML injection Commonly known as: novoLOG   loratadine 10 MG tablet Commonly known as: CLARITIN     TAKE these medications   Accu-Chek Aviva Plus w/Device Kit   acetaminophen 325 MG tablet Commonly known as: TYLENOL Take 2 tablets (650 mg total) by mouth every 6 (six) hours as needed for mild pain (or Fever >/= 101).   amoxicillin-clavulanate 875-125 MG tablet Commonly known as: AUGMENTIN Take 1 tablet by mouth 2 (two) times daily for 14 days.   carvedilol 3.125 MG tablet Commonly known as: COREG Take 1 tablet (3.125 mg total) by mouth 2 (two) times daily with a meal.   metFORMIN 1000 MG tablet Commonly known as: GLUCOPHAGE Take 1 tablet (1,000 mg total) by mouth daily.   multivitamin with minerals Tabs tablet Take 1 tablet by mouth daily.   QUEtiapine 300 MG tablet Commonly known as: SEROQUEL Take 300 mg by mouth at bedtime. What changed: Another medication with the same name was removed. Continue taking this medication, and follow the directions you see here.   sertraline 50 MG tablet Commonly known as: ZOLOFT Take 50 mg by mouth daily.   sulfamethoxazole-trimethoprim 800-160 MG tablet Commonly known as: BACTRIM DS Take 2 tablets by mouth 2 (two) times daily for 14 days.   traZODone 150 MG tablet Commonly known as: DESYREL Take  150 mg by mouth at bedtime. What changed: Another medication with the same name was removed. Continue taking this medication, and follow the directions you see here.            Durable Medical Equipment  (From admission, onward)         Start     Ordered   10/21/19 1405  For home use only DME Walker rolling  Once    Question Answer Comment  Walker: With 5 Inch Wheels   Patient needs a walker to treat with the following  condition Gait instability      10/21/19 1404          Vitals:   10/21/19 0812 10/21/19 1450  BP: 110/74 108/64  Pulse: 77 60  Resp: 17 17  Temp: 97.9 F (36.6 C) 98.6 F (37 C)  SpO2: 97% 97%    Cory Palmer How to contact the [facility] Attending or Consulting provider [place] or covering provider during after hours [place], for this patient?  1. Check the care team in [facility] and look for a) attending/consulting TR[facility] provider listed and b) the [facility] team listed 2. Log into [email or web address] and use [place]'s universal password to access. If you do not have the password, please contact the hospital operator. 3. Locate the [facility] provider you are looking for under [facility] and page to a number that you can be directly reached. 4. If you still have difficulty reaching the provider, please page the [facility] (Director on Call) for the Hospitalists listed on amion for assistance.

## 2019-10-21 NOTE — Evaluation (Signed)
Physical Therapy Evaluation Patient Details Name: Cory Palmer MRN: [id number] DOB: 02/23/1961 Today's Date: 10/21/2019   History of Present Illness  59 year old male admitted with left foot wound with plan for BKA on 3/24. 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 Pt states he lives in a studio apartment without family to assist, walks with a cane with prosthesis. Pt educated for change in mobility and function with bil BKA and attempted to perform standing with use of RLE but pt unable to rise with assist and only use of RLE. Pt performed lateral transfers and educated for bil UE strengthening, lateral transfers and HEP for RLE in prep for L BKA. Pt educated for RW use to off weight LLE currently. Pt demonstrates decreased strength, function, gait and will benefit from acute therapy to maximize mobility in preparation for L BKA and after.      Follow Up Recommendations (pre surgery no follow up but anticipate SNF post op L BKA)    Equipment Recommendations  Rolling walker with 5" wheels    Recommendations for Other Services       Precautions / Restrictions Precautions Precautions: Fall Precaution Comments: Rt BKA with prosthesis Restrictions Weight Bearing Restrictions: No      Mobility  Bed Mobility Overal bed mobility: Modified Independent                Transfers Overall transfer level: Needs assistance   Transfers: Sit to/from Stand;Lateral/Scoot Transfers Sit to Stand: Supervision        Lateral/Scoot Transfers: Supervision General transfer comment: pt educated for plan post op with initial instruction to attempt standing without weight on LLE. Pt unable to achieve standing without significant weight on LLE. Pt performed lateral transfer with drop arm  recliner to and from bed with supervision for sequence and maintaining NWB on LLE  Ambulation/Gait Ambulation/Gait assistance: Min guard Gait Distance (Feet): 12 Feet Assistive device: Rolling walker (2 wheeled) Gait Pattern/deviations: Step-through pattern;Decreased stride length   Gait velocity interpretation: 1.31 - 2.62 ft/sec, indicative of limited community ambulator General Gait Details: pt with limited gait tolerance reporting fatigue with education to limit weight on LLE and utilize bil UE support  Careers information officer    Modified Rankin (Stroke Patients Only)       Balance Overall balance assessment: Mild deficits observed, not formally tested                                           Pertinent Vitals/Pain Pain Assessment: No/denies pain    Home Living Family/patient expects to be discharged to:: Private residence Living Arrangements: Alone   Type of Home: Apartment Home Access: Level entry     Home Layout: One level Home Equipment: Cane - single point;Shower seat;Other (comment)(prosthesis)      Prior Function Level of Independence: Independent with assistive device(s)         Comments: uses a cane for ambulation     Hand Dominance        Extremity/Trunk Assessment   Upper Extremity Assessment Upper Extremity Assessment: Overall WFL for tasks assessed    Lower Extremity Assessment Lower Extremity Assessment: RLE deficits/detail;LLE deficits/detail  RLE Deficits / Details: BKA LLE Deficits / Details: edema with wrapping left foot       Communication   Communication: No difficulties  Cognition Arousal/Alertness: Awake/alert Behavior During Therapy: WFL for tasks assessed/performed Overall Cognitive Status: Within Functional Limits for tasks assessed                                        General Comments      Exercises     Assessment/Plan    PT Assessment Patient needs  continued PT services  PT Problem List Decreased mobility;Decreased activity tolerance;Decreased knowledge of use of DME;Decreased balance;Decreased skin integrity       PT Treatment Interventions DME instruction;Gait training;Balance training;Functional mobility training;Therapeutic activities;Patient/family education    PT Goals (Current goals can be found in the Care Plan section)  Acute Rehab PT Goals Patient Stated Goal: return home and then come back later for surgery to get things straight PT Goal Formulation: With patient Time For Goal Achievement: 10/28/19 Potential to Achieve Goals: Good    Frequency Min 3X/week   Barriers to discharge Decreased caregiver support      Co-evaluation               AM-PAC PT "6 Clicks" Mobility  Outcome Measure Help needed turning from your back to your side while in a flat bed without using bedrails?: None Help needed moving from lying on your back to sitting on the side of a flat bed without using bedrails?: None Help needed moving to and from a bed to a chair (including a wheelchair)?: A Little Help needed standing up from a chair using your arms (e.g., wheelchair or bedside chair)?: A Little Help needed to walk in hospital room?: A Little Help needed climbing 3-5 steps with a railing? : A Little 6 Click Score: 20    End of Session Equipment Utilized During Treatment: Gait belt Activity Tolerance: Patient tolerated treatment well Patient left: in chair;with call bell/phone within reach;with chair alarm set Nurse Communication: Mobility status PT Visit Diagnosis: Other abnormalities of gait and mobility (R26.89);Muscle weakness (generalized) (M62.81)    Time: 2683-4196 PT Time Calculation (min) (ACUTE ONLY): 22 min   Charges:   PT Evaluation $PT Eval Moderate Complexity: 1 Mod          Morris Markham P, PT Acute Rehabilitation Services Pager: [phone] Office: [phone]   Sandy Salaam Honestie Kulik 10/21/2019, 12:48 PM

## 2019-10-21 NOTE — Plan of Care (Signed)
Problem: Education: Goal: Knowledge of General Education information will improve Description Including pain rating scale, medication(s)/side effects and non-pharmacologic comfort measures Outcome: Progressing   Problem: Clinical Measurements: Goal: Will remain free from infection Outcome: Progressing   Problem: Activity: Goal: Risk for activity intolerance will decrease Outcome: Progressing   Problem: Skin Integrity: Goal: Risk for impaired skin integrity will decrease Outcome: Progressing   

## 2019-10-22 ENCOUNTER — Encounter (HOSPITAL_COMMUNITY): Payer: Self-pay | Admitting: Emergency Medicine

## 2019-10-22 ENCOUNTER — Inpatient Hospital Stay (HOSPITAL_COMMUNITY)
Admission: EM | Admit: 2019-10-22 | Discharge: 2019-11-06 | DRG: 616 | Disposition: A | Discharge status: Home Health | Payer: Medicare Other | Attending: Internal Medicine | Admitting: Internal Medicine

## 2019-10-22 ENCOUNTER — Telehealth: Payer: Self-pay | Admitting: Infectious Diseases

## 2019-10-22 ENCOUNTER — Other Ambulatory Visit: Payer: Self-pay

## 2019-10-22 DIAGNOSIS — I10 Essential (primary) hypertension: Secondary | ICD-10-CM | POA: Diagnosis present

## 2019-10-22 DIAGNOSIS — L0390 Cellulitis, unspecified: Secondary | ICD-10-CM | POA: Diagnosis present

## 2019-10-22 DIAGNOSIS — Z89432 Acquired absence of left foot: Secondary | ICD-10-CM

## 2019-10-22 DIAGNOSIS — E11628 Type 2 diabetes mellitus with other skin complications: Secondary | ICD-10-CM | POA: Diagnosis present

## 2019-10-22 DIAGNOSIS — Z833 Family history of diabetes mellitus: Secondary | ICD-10-CM

## 2019-10-22 DIAGNOSIS — L97429 Non-pressure chronic ulcer of left heel and midfoot with unspecified severity: Secondary | ICD-10-CM | POA: Diagnosis present

## 2019-10-22 DIAGNOSIS — R7881 Bacteremia: Secondary | ICD-10-CM | POA: Diagnosis present

## 2019-10-22 DIAGNOSIS — F3130 Bipolar disorder, current episode depressed, mild or moderate severity, unspecified: Secondary | ICD-10-CM | POA: Diagnosis present

## 2019-10-22 DIAGNOSIS — Q211 Atrial septal defect: Secondary | ICD-10-CM

## 2019-10-22 DIAGNOSIS — Z79899 Other long term (current) drug therapy: Secondary | ICD-10-CM

## 2019-10-22 DIAGNOSIS — I480 Paroxysmal atrial fibrillation: Secondary | ICD-10-CM | POA: Diagnosis present

## 2019-10-22 DIAGNOSIS — Z20822 Contact with and (suspected) exposure to covid-19: Secondary | ICD-10-CM | POA: Diagnosis present

## 2019-10-22 DIAGNOSIS — E1140 Type 2 diabetes mellitus with diabetic neuropathy, unspecified: Secondary | ICD-10-CM | POA: Diagnosis present

## 2019-10-22 DIAGNOSIS — E1169 Type 2 diabetes mellitus with other specified complication: Principal | ICD-10-CM | POA: Diagnosis present

## 2019-10-22 DIAGNOSIS — K7460 Unspecified cirrhosis of liver: Secondary | ICD-10-CM | POA: Diagnosis present

## 2019-10-22 DIAGNOSIS — I083 Combined rheumatic disorders of mitral, aortic and tricuspid valves: Secondary | ICD-10-CM | POA: Diagnosis present

## 2019-10-22 DIAGNOSIS — E43 Unspecified severe protein-calorie malnutrition: Secondary | ICD-10-CM | POA: Diagnosis present

## 2019-10-22 DIAGNOSIS — E871 Hypo-osmolality and hyponatremia: Secondary | ICD-10-CM | POA: Diagnosis present

## 2019-10-22 DIAGNOSIS — F419 Anxiety disorder, unspecified: Secondary | ICD-10-CM | POA: Diagnosis present

## 2019-10-22 DIAGNOSIS — E11621 Type 2 diabetes mellitus with foot ulcer: Secondary | ICD-10-CM | POA: Diagnosis present

## 2019-10-22 DIAGNOSIS — F17210 Nicotine dependence, cigarettes, uncomplicated: Secondary | ICD-10-CM | POA: Diagnosis present

## 2019-10-22 DIAGNOSIS — M869 Osteomyelitis, unspecified: Secondary | ICD-10-CM | POA: Diagnosis present

## 2019-10-22 DIAGNOSIS — B9562 Methicillin resistant Staphylococcus aureus infection as the cause of diseases classified elsewhere: Secondary | ICD-10-CM | POA: Diagnosis present

## 2019-10-22 DIAGNOSIS — E119 Type 2 diabetes mellitus without complications: Secondary | ICD-10-CM

## 2019-10-22 DIAGNOSIS — F319 Bipolar disorder, unspecified: Secondary | ICD-10-CM | POA: Diagnosis present

## 2019-10-22 DIAGNOSIS — Z8249 Family history of ischemic heart disease and other diseases of the circulatory system: Secondary | ICD-10-CM

## 2019-10-22 DIAGNOSIS — L02612 Cutaneous abscess of left foot: Secondary | ICD-10-CM | POA: Diagnosis present

## 2019-10-22 DIAGNOSIS — D509 Iron deficiency anemia, unspecified: Secondary | ICD-10-CM | POA: Diagnosis present

## 2019-10-22 DIAGNOSIS — L97521 Non-pressure chronic ulcer of other part of left foot limited to breakdown of skin: Secondary | ICD-10-CM

## 2019-10-22 DIAGNOSIS — Z7984 Long term (current) use of oral hypoglycemic drugs: Secondary | ICD-10-CM

## 2019-10-22 LAB — BASIC METABOLIC PANEL
Anion gap: 11 (ref 5–15)
BUN: 8 mg/dL (ref 6–20)
CO2: 19 mmol/L — ABNORMAL LOW (ref 22–32)
Calcium: 8.2 mg/dL — ABNORMAL LOW (ref 8.9–10.3)
Chloride: 102 mmol/L (ref 98–111)
Creatinine, Ser: 0.67 mg/dL (ref 0.61–1.24)
GFR calc Af Amer: >60 mL/min (ref >60)
GFR calc non Af Amer: >60 mL/min (ref >60)
Glucose, Bld: 155 mg/dL — ABNORMAL HIGH (ref 70–99)
Potassium: 3.7 mmol/L (ref 3.5–5.1)
Sodium: 132 mmol/L — ABNORMAL LOW (ref 135–145)

## 2019-10-22 LAB — CBC
HCT: 25.9 % — ABNORMAL LOW (ref 39.0–52.0)
Hemoglobin: 8.5 g/dL — ABNORMAL LOW (ref 13.0–17.0)
MCH: 30.6 pg (ref 26.0–34.0)
MCHC: 32.8 g/dL (ref 30.0–36.0)
MCV: 93.2 fL (ref 80.0–100.0)
Platelets: 243 10*3/uL (ref 150–400)
RBC: 2.78 MIL/uL — ABNORMAL LOW (ref 4.22–5.81)
RDW: 14.6 % (ref 11.5–15.5)
WBC: 10.9 10*3/uL — ABNORMAL HIGH (ref 4.0–10.5)
nRBC: 0 % (ref 0.0–0.2)

## 2019-10-22 LAB — CULTURE, BLOOD (ROUTINE X 2): Special Requests: ADEQUATE

## 2019-10-22 NOTE — ED Triage Notes (Addendum)
Pt arrives to ED from with complaints of leaving AMA from the hospital today and he has scheduled surgery tomorrow morning. Patient states he went home to pay bills. Patient needs left BKA for infected foot with staph aureus bacteremia with Dr. [name] on 3/23. Patient here to get re-admitted so he does not miss his surgery.

## 2019-10-22 NOTE — Telephone Encounter (Signed)
Unable to leave voicemail - attempted to call x 2 to check on Cory Palmer after he left AMA with MSSA bacteremia we think due to open chronic foot ulcer.   Unclear what his plans are at this time for surgery with Dr. [name].

## 2019-10-23 ENCOUNTER — Inpatient Hospital Stay (HOSPITAL_COMMUNITY): Payer: Medicare Other | Admitting: Certified Registered Nurse Anesthetist

## 2019-10-23 ENCOUNTER — Encounter (HOSPITAL_COMMUNITY)
Admission: EM | Disposition: A | Discharge status: Home Health | Payer: Self-pay | Source: Home / Self Care | Attending: Family Medicine

## 2019-10-23 ENCOUNTER — Encounter (HOSPITAL_COMMUNITY): Payer: Self-pay | Admitting: Family Medicine

## 2019-10-23 ENCOUNTER — Other Ambulatory Visit: Payer: Self-pay | Admitting: Physician Assistant

## 2019-10-23 DIAGNOSIS — L0390 Cellulitis, unspecified: Secondary | ICD-10-CM | POA: Diagnosis not present

## 2019-10-23 DIAGNOSIS — R7881 Bacteremia: Secondary | ICD-10-CM

## 2019-10-23 DIAGNOSIS — F419 Anxiety disorder, unspecified: Secondary | ICD-10-CM | POA: Diagnosis present

## 2019-10-23 DIAGNOSIS — D509 Iron deficiency anemia, unspecified: Secondary | ICD-10-CM | POA: Diagnosis present

## 2019-10-23 DIAGNOSIS — Q211 Atrial septal defect: Secondary | ICD-10-CM | POA: Diagnosis not present

## 2019-10-23 DIAGNOSIS — E871 Hypo-osmolality and hyponatremia: Secondary | ICD-10-CM | POA: Diagnosis not present

## 2019-10-23 DIAGNOSIS — I480 Paroxysmal atrial fibrillation: Secondary | ICD-10-CM

## 2019-10-23 DIAGNOSIS — M869 Osteomyelitis, unspecified: Secondary | ICD-10-CM

## 2019-10-23 DIAGNOSIS — I340 Nonrheumatic mitral (valve) insufficiency: Secondary | ICD-10-CM | POA: Diagnosis not present

## 2019-10-23 DIAGNOSIS — E1169 Type 2 diabetes mellitus with other specified complication: Secondary | ICD-10-CM | POA: Diagnosis not present

## 2019-10-23 DIAGNOSIS — B9562 Methicillin resistant Staphylococcus aureus infection as the cause of diseases classified elsewhere: Secondary | ICD-10-CM | POA: Diagnosis present

## 2019-10-23 DIAGNOSIS — B9561 Methicillin susceptible Staphylococcus aureus infection as the cause of diseases classified elsewhere: Secondary | ICD-10-CM

## 2019-10-23 DIAGNOSIS — E43 Unspecified severe protein-calorie malnutrition: Secondary | ICD-10-CM | POA: Diagnosis not present

## 2019-10-23 DIAGNOSIS — Z89432 Acquired absence of left foot: Secondary | ICD-10-CM

## 2019-10-23 DIAGNOSIS — I10 Essential (primary) hypertension: Secondary | ICD-10-CM | POA: Diagnosis present

## 2019-10-23 DIAGNOSIS — F319 Bipolar disorder, unspecified: Secondary | ICD-10-CM

## 2019-10-23 DIAGNOSIS — F3130 Bipolar disorder, current episode depressed, mild or moderate severity, unspecified: Secondary | ICD-10-CM | POA: Diagnosis not present

## 2019-10-23 DIAGNOSIS — L97429 Non-pressure chronic ulcer of left heel and midfoot with unspecified severity: Secondary | ICD-10-CM | POA: Diagnosis not present

## 2019-10-23 DIAGNOSIS — E119 Type 2 diabetes mellitus without complications: Secondary | ICD-10-CM | POA: Diagnosis not present

## 2019-10-23 DIAGNOSIS — Z8249 Family history of ischemic heart disease and other diseases of the circulatory system: Secondary | ICD-10-CM | POA: Diagnosis not present

## 2019-10-23 DIAGNOSIS — L02612 Cutaneous abscess of left foot: Secondary | ICD-10-CM | POA: Diagnosis not present

## 2019-10-23 DIAGNOSIS — E1140 Type 2 diabetes mellitus with diabetic neuropathy, unspecified: Secondary | ICD-10-CM | POA: Diagnosis present

## 2019-10-23 DIAGNOSIS — E11621 Type 2 diabetes mellitus with foot ulcer: Secondary | ICD-10-CM | POA: Diagnosis present

## 2019-10-23 DIAGNOSIS — K7460 Unspecified cirrhosis of liver: Secondary | ICD-10-CM | POA: Diagnosis present

## 2019-10-23 DIAGNOSIS — Z20822 Contact with and (suspected) exposure to covid-19: Secondary | ICD-10-CM | POA: Diagnosis not present

## 2019-10-23 DIAGNOSIS — I083 Combined rheumatic disorders of mitral, aortic and tricuspid valves: Secondary | ICD-10-CM | POA: Diagnosis present

## 2019-10-23 DIAGNOSIS — F17210 Nicotine dependence, cigarettes, uncomplicated: Secondary | ICD-10-CM | POA: Diagnosis present

## 2019-10-23 DIAGNOSIS — E11628 Type 2 diabetes mellitus with other skin complications: Secondary | ICD-10-CM | POA: Diagnosis not present

## 2019-10-23 HISTORY — PX: AMPUTATION: SHX166

## 2019-10-23 LAB — CBC WITH DIFFERENTIAL/PLATELET
Abs Immature Granulocytes: 0.13 10*3/uL — ABNORMAL HIGH (ref 0.00–0.07)
Basophils Absolute: 0 10*3/uL (ref 0.0–0.1)
Basophils Relative: 1 %
Eosinophils Absolute: 0.5 10*3/uL (ref 0.0–0.5)
Eosinophils Relative: 6 %
HCT: 23.1 % — ABNORMAL LOW (ref 39.0–52.0)
Hemoglobin: 7.7 g/dL — ABNORMAL LOW (ref 13.0–17.0)
Immature Granulocytes: 2 %
Lymphocytes Relative: 16 %
Lymphs Abs: 1.2 10*3/uL (ref 0.7–4.0)
MCH: 31 pg (ref 26.0–34.0)
MCHC: 33.3 g/dL (ref 30.0–36.0)
MCV: 93.1 fL (ref 80.0–100.0)
Monocytes Absolute: 0.7 10*3/uL (ref 0.1–1.0)
Monocytes Relative: 10 %
Neutro Abs: 5.1 10*3/uL (ref 1.7–7.7)
Neutrophils Relative %: 65 %
Platelets: 201 10*3/uL (ref 150–400)
RBC: 2.48 MIL/uL — ABNORMAL LOW (ref 4.22–5.81)
RDW: 14.6 % (ref 11.5–15.5)
WBC: 7.6 10*3/uL (ref 4.0–10.5)
nRBC: 0 % (ref 0.0–0.2)

## 2019-10-23 LAB — COMPREHENSIVE METABOLIC PANEL
ALT: 17 U/L (ref 0–44)
AST: 31 U/L (ref 15–41)
Albumin: 1.9 g/dL — ABNORMAL LOW (ref 3.5–5.0)
Alkaline Phosphatase: 184 U/L — ABNORMAL HIGH (ref 38–126)
Anion gap: 8 (ref 5–15)
BUN: 6 mg/dL (ref 6–20)
CO2: 22 mmol/L (ref 22–32)
Calcium: 8 mg/dL — ABNORMAL LOW (ref 8.9–10.3)
Chloride: 108 mmol/L (ref 98–111)
Creatinine, Ser: 0.64 mg/dL (ref 0.61–1.24)
GFR calc Af Amer: >60 mL/min (ref >60)
GFR calc non Af Amer: >60 mL/min (ref >60)
Glucose, Bld: 115 mg/dL — ABNORMAL HIGH (ref 70–99)
Potassium: 3.4 mmol/L — ABNORMAL LOW (ref 3.5–5.1)
Sodium: 138 mmol/L (ref 135–145)
Total Bilirubin: 1.3 mg/dL — ABNORMAL HIGH (ref 0.3–1.2)
Total Protein: 6.2 g/dL — ABNORMAL LOW (ref 6.5–8.1)

## 2019-10-23 LAB — GLUCOSE, CAPILLARY
Glucose-Capillary: 143 mg/dL — ABNORMAL HIGH (ref 70–99)
Glucose-Capillary: 93 mg/dL (ref 70–99)
Glucose-Capillary: 103 mg/dL — ABNORMAL HIGH (ref 70–99)
Glucose-Capillary: 166 mg/dL — ABNORMAL HIGH (ref 70–99)
Glucose-Capillary: 165 mg/dL — ABNORMAL HIGH (ref 70–99)

## 2019-10-23 LAB — MAGNESIUM: Magnesium: 1.7 mg/dL (ref 1.7–2.4)

## 2019-10-23 LAB — IRON AND TIBC
Iron: 22 ug/dL — ABNORMAL LOW (ref 45–182)
Saturation Ratios: 12 % — ABNORMAL LOW (ref 17.9–39.5)
TIBC: 179 ug/dL — ABNORMAL LOW (ref 250–450)
UIBC: 157 ug/dL

## 2019-10-23 LAB — FERRITIN: Ferritin: 140 ng/mL (ref 24–336)

## 2019-10-23 LAB — TYPE AND SCREEN
ABO/RH(D): O POS
Antibody Screen: NEGATIVE

## 2019-10-23 LAB — SARS CORONAVIRUS 2 (TAT 6-24 HRS): SARS Coronavirus 2: NEGATIVE

## 2019-10-23 LAB — HEMOGLOBIN AND HEMATOCRIT, BLOOD
HCT: 24.7 % — ABNORMAL LOW (ref 39.0–52.0)
Hemoglobin: 7.8 g/dL — ABNORMAL LOW (ref 13.0–17.0)

## 2019-10-23 LAB — PROTIME-INR
INR: 1.4 — ABNORMAL HIGH (ref 0.8–1.2)
Prothrombin Time: 16.6 seconds — ABNORMAL HIGH (ref 11.4–15.2)

## 2019-10-23 LAB — SURGICAL PCR SCREEN
MRSA, PCR: POSITIVE — AB
Staphylococcus aureus: POSITIVE — AB

## 2019-10-23 LAB — FOLATE: Folate: 23.4 ng/mL (ref >5.9)

## 2019-10-23 LAB — VITAMIN B12: Vitamin B-12: 950 pg/mL — ABNORMAL HIGH (ref 180–914)

## 2019-10-23 LAB — ABO/RH: ABO/RH(D): O POS

## 2019-10-23 SURGERY — AMPUTATION BELOW KNEE
Anesthesia: Monitor Anesthesia Care | Site: Knee | Laterality: Left

## 2019-10-23 MED ORDER — ROPIVACAINE HCL 7.5 MG/ML IJ SOLN
INTRAMUSCULAR | Status: DC | PRN
  Administered 2019-10-23: 14 mL via PERINEURAL
  Administered 2019-10-23: 20 mL via PERINEURAL

## 2019-10-23 MED ORDER — ACETAMINOPHEN 325 MG PO TABS
650.0000 mg | ORAL_TABLET | Freq: Four times a day (QID) | ORAL | Status: DC | PRN
  Administered 2019-10-24 – 2019-10-25 (×2): 650 mg via ORAL
  Filled 2019-10-23 (×2): qty 2

## 2019-10-23 MED ORDER — MUPIROCIN 2 % EX OINT
1.0000 "application " | TOPICAL_OINTMENT | Freq: Two times a day (BID) | CUTANEOUS | Status: AC
  Administered 2019-10-23 – 2019-10-27 (×9): 1 via NASAL
  Filled 2019-10-23 (×3): qty 22

## 2019-10-23 MED ORDER — CLONIDINE HCL (ANALGESIA) 100 MCG/ML EP SOLN
EPIDURAL | Status: DC | PRN
  Administered 2019-10-23 (×2): 100 ug

## 2019-10-23 MED ORDER — FENTANYL CITRATE (PF) 100 MCG/2ML IJ SOLN
INTRAMUSCULAR | Status: AC
  Administered 2019-10-23: 50 ug via INTRAVENOUS
  Filled 2019-10-23: qty 2

## 2019-10-23 MED ORDER — CHLORHEXIDINE GLUCONATE 4 % EX LIQD: 60.0000 mL | Freq: Once | CUTANEOUS | Status: DC

## 2019-10-23 MED ORDER — PROMETHAZINE HCL 25 MG/ML IJ SOLN: 12.5000 mg | Freq: Four times a day (QID) | INTRAMUSCULAR | Status: DC | PRN

## 2019-10-23 MED ORDER — PROPOFOL 10 MG/ML IV BOLUS
INTRAVENOUS | Status: AC
  Filled 2019-10-23: qty 20

## 2019-10-23 MED ORDER — ONDANSETRON HCL 4 MG/2ML IJ SOLN: 4.0000 mg | Freq: Four times a day (QID) | INTRAMUSCULAR | Status: DC | PRN

## 2019-10-23 MED ORDER — LACTATED RINGERS IV SOLN: INTRAVENOUS | Status: DC

## 2019-10-23 MED ORDER — SODIUM CHLORIDE 0.9 % IV SOLN
3.0000 g | Freq: Four times a day (QID) | INTRAVENOUS | Status: DC
  Administered 2019-10-23 – 2019-10-24 (×5): 3 g via INTRAVENOUS
  Filled 2019-10-23 (×8): qty 8

## 2019-10-23 MED ORDER — SENNOSIDES-DOCUSATE SODIUM 8.6-50 MG PO TABS: 1.0000 | ORAL_TABLET | Freq: Every evening | ORAL | Status: DC | PRN

## 2019-10-23 MED ORDER — ONDANSETRON HCL 4 MG/2ML IJ SOLN
INTRAMUSCULAR | Status: DC | PRN
  Administered 2019-10-23: 4 mg via INTRAVENOUS

## 2019-10-23 MED ORDER — PROMETHAZINE HCL 25 MG PO TABS: 12.5000 mg | ORAL_TABLET | Freq: Four times a day (QID) | ORAL | Status: DC | PRN

## 2019-10-23 MED ORDER — MIDAZOLAM HCL 2 MG/2ML IJ SOLN: 1.0000 mg | Freq: Once | INTRAMUSCULAR | Status: AC

## 2019-10-23 MED ORDER — PROPOFOL 500 MG/50ML IV EMUL
INTRAVENOUS | Status: DC | PRN
  Administered 2019-10-23: 125 ug/kg/min via INTRAVENOUS

## 2019-10-23 MED ORDER — TRAZODONE HCL 50 MG PO TABS
150.0000 mg | ORAL_TABLET | Freq: Every day | ORAL | Status: DC
  Administered 2019-10-23 – 2019-11-05 (×15): 150 mg via ORAL
  Filled 2019-10-23 (×15): qty 3

## 2019-10-23 MED ORDER — CEFAZOLIN SODIUM-DEXTROSE 2-4 GM/100ML-% IV SOLN
2.0000 g | INTRAVENOUS | Status: DC
  Filled 2019-10-23: qty 100

## 2019-10-23 MED ORDER — POTASSIUM CHLORIDE IN NACL 20-0.9 MEQ/L-% IV SOLN
INTRAVENOUS | Status: AC
  Filled 2019-10-23: qty 1000

## 2019-10-23 MED ORDER — INSULIN ASPART 100 UNIT/ML ~~LOC~~ SOLN
0.0000 [IU] | SUBCUTANEOUS | Status: DC
  Administered 2019-10-23 (×2): 2 [IU] via SUBCUTANEOUS
  Administered 2019-10-23 – 2019-10-24 (×2): 1 [IU] via SUBCUTANEOUS
  Administered 2019-10-24 (×2): 2 [IU] via SUBCUTANEOUS
  Administered 2019-10-25 (×4): 1 [IU] via SUBCUTANEOUS
  Administered 2019-10-25 – 2019-10-26 (×2): 2 [IU] via SUBCUTANEOUS

## 2019-10-23 MED ORDER — VANCOMYCIN HCL 2000 MG/400ML IV SOLN
2000.0000 mg | Freq: Once | INTRAVENOUS | Status: AC
  Administered 2019-10-23: 05:00:00 2000 mg via INTRAVENOUS
  Filled 2019-10-23: qty 400

## 2019-10-23 MED ORDER — SERTRALINE HCL 50 MG PO TABS
50.0000 mg | ORAL_TABLET | Freq: Every day | ORAL | Status: DC
  Administered 2019-10-23 – 2019-11-06 (×15): 50 mg via ORAL
  Filled 2019-10-23 (×15): qty 1

## 2019-10-23 MED ORDER — VANCOMYCIN HCL 1250 MG/250ML IV SOLN
1250.0000 mg | Freq: Two times a day (BID) | INTRAVENOUS | Status: DC
  Administered 2019-10-23 – 2019-10-26 (×7): 1250 mg via INTRAVENOUS
  Filled 2019-10-23 (×7): qty 250

## 2019-10-23 MED ORDER — METOCLOPRAMIDE HCL 5 MG PO TABS: 5.0000 mg | ORAL_TABLET | Freq: Three times a day (TID) | ORAL | Status: DC | PRN

## 2019-10-23 MED ORDER — 0.9 % SODIUM CHLORIDE (POUR BTL) OPTIME
TOPICAL | Status: DC | PRN
  Administered 2019-10-23: 1000 mL

## 2019-10-23 MED ORDER — ACETAMINOPHEN 650 MG RE SUPP: 650.0000 mg | Freq: Four times a day (QID) | RECTAL | Status: DC | PRN

## 2019-10-23 MED ORDER — PROPOFOL 10 MG/ML IV BOLUS
INTRAVENOUS | Status: DC | PRN
  Administered 2019-10-23: 40 mg via INTRAVENOUS

## 2019-10-23 MED ORDER — CHLORHEXIDINE GLUCONATE CLOTH 2 % EX PADS
6.0000 | MEDICATED_PAD | Freq: Every day | CUTANEOUS | Status: AC
  Administered 2019-10-23 – 2019-10-26 (×4): 6 via TOPICAL

## 2019-10-23 MED ORDER — METOCLOPRAMIDE HCL 5 MG/ML IJ SOLN: 5.0000 mg | Freq: Three times a day (TID) | INTRAMUSCULAR | Status: DC | PRN

## 2019-10-23 MED ORDER — SUCCINYLCHOLINE CHLORIDE 200 MG/10ML IV SOSY
PREFILLED_SYRINGE | INTRAVENOUS | Status: AC
  Filled 2019-10-23: qty 20

## 2019-10-23 MED ORDER — DEXAMETHASONE SODIUM PHOSPHATE 10 MG/ML IJ SOLN
INTRAMUSCULAR | Status: AC
  Filled 2019-10-23: qty 4

## 2019-10-23 MED ORDER — FENTANYL CITRATE (PF) 250 MCG/5ML IJ SOLN
INTRAMUSCULAR | Status: AC
  Filled 2019-10-23: qty 5

## 2019-10-23 MED ORDER — HYDROMORPHONE HCL 1 MG/ML IJ SOLN: 0.5000 mg | INTRAMUSCULAR | Status: DC | PRN

## 2019-10-23 MED ORDER — ONDANSETRON HCL 4 MG/2ML IJ SOLN
INTRAMUSCULAR | Status: AC
  Filled 2019-10-23: qty 12

## 2019-10-23 MED ORDER — MIDAZOLAM HCL 2 MG/2ML IJ SOLN
INTRAMUSCULAR | Status: AC
  Administered 2019-10-23: 1 mg via INTRAVENOUS
  Filled 2019-10-23: qty 2

## 2019-10-23 MED ORDER — OXYCODONE HCL 5 MG PO TABS
5.0000 mg | ORAL_TABLET | ORAL | Status: DC | PRN
  Administered 2019-10-23 – 2019-10-26 (×8): 10 mg via ORAL
  Administered 2019-10-28 – 2019-10-29 (×3): 5 mg via ORAL
  Administered 2019-10-30 – 2019-11-05 (×7): 10 mg via ORAL
  Filled 2019-10-23 (×2): qty 1
  Filled 2019-10-23 (×4): qty 2
  Filled 2019-10-23: qty 1
  Filled 2019-10-23 (×5): qty 2
  Filled 2019-10-23: qty 1
  Filled 2019-10-23 (×6): qty 2
  Filled 2019-10-23: qty 1

## 2019-10-23 MED ORDER — SODIUM CHLORIDE 0.9 % IV SOLN: INTRAVENOUS | Status: DC

## 2019-10-23 MED ORDER — ONDANSETRON HCL 4 MG PO TABS: 4.0000 mg | ORAL_TABLET | Freq: Four times a day (QID) | ORAL | Status: DC | PRN

## 2019-10-23 MED ORDER — LIDOCAINE 2% (20 MG/ML) 5 ML SYRINGE
INTRAMUSCULAR | Status: AC
  Filled 2019-10-23: qty 15

## 2019-10-23 MED ORDER — DOCUSATE SODIUM 100 MG PO CAPS
100.0000 mg | ORAL_CAPSULE | Freq: Two times a day (BID) | ORAL | Status: DC
  Administered 2019-10-23 – 2019-11-06 (×28): 100 mg via ORAL
  Filled 2019-10-23 (×29): qty 1

## 2019-10-23 MED ORDER — QUETIAPINE FUMARATE 100 MG PO TABS
300.0000 mg | ORAL_TABLET | Freq: Every day | ORAL | Status: DC
  Administered 2019-10-23 – 2019-11-05 (×15): 300 mg via ORAL
  Filled 2019-10-23 (×2): qty 3
  Filled 2019-10-23: qty 1
  Filled 2019-10-23 (×12): qty 3

## 2019-10-23 MED ORDER — FENTANYL CITRATE (PF) 100 MCG/2ML IJ SOLN: 50.0000 ug | Freq: Once | INTRAMUSCULAR | Status: AC

## 2019-10-23 MED ORDER — MIDAZOLAM HCL 2 MG/2ML IJ SOLN
INTRAMUSCULAR | Status: AC
  Filled 2019-10-23: qty 2

## 2019-10-23 SURGICAL SUPPLY — 38 items
BLADE SAW RECIP 87.9 MT (BLADE) ×2 IMPLANT
BLADE SURG 21 STRL SS (BLADE) ×2 IMPLANT
BNDG COHESIVE 6X5 TAN STRL LF (GAUZE/BANDAGES/DRESSINGS) IMPLANT
CANISTER WOUND CARE 500ML ATS (WOUND CARE) ×2 IMPLANT
COVER SURGICAL LIGHT HANDLE (MISCELLANEOUS) ×2 IMPLANT
COVER WAND RF STERILE (DRAPES) IMPLANT
CUFF TOURN SGL QUICK 34 (TOURNIQUET CUFF) ×2
CUFF TRNQT CYL 34X4.125X (TOURNIQUET CUFF) ×1 IMPLANT
DRAPE INCISE IOBAN 66X45 STRL (DRAPES) ×2 IMPLANT
DRAPE U-SHAPE 47X51 STRL (DRAPES) ×2 IMPLANT
DRESSING PREVENA PLUS CUSTOM (GAUZE/BANDAGES/DRESSINGS) ×1 IMPLANT
DRSG PREVENA PLUS CUSTOM (GAUZE/BANDAGES/DRESSINGS) ×2
DURAPREP 26ML APPLICATOR (WOUND CARE) ×2 IMPLANT
ELECT REM PT RETURN 9FT ADLT (ELECTROSURGICAL) ×2
ELECTRODE REM PT RTRN 9FT ADLT (ELECTROSURGICAL) ×1 IMPLANT
GLOVE BIOGEL PI IND STRL 9 (GLOVE) ×1 IMPLANT
GLOVE BIOGEL PI INDICATOR 9 (GLOVE) ×1
GLOVE SURG ORTHO 9.0 STRL STRW (GLOVE) ×2 IMPLANT
GOWN STRL REUS W/ TWL XL LVL3 (GOWN DISPOSABLE) ×2 IMPLANT
GOWN STRL REUS W/TWL XL LVL3 (GOWN DISPOSABLE) ×4
KIT BASIN OR (CUSTOM PROCEDURE TRAY) ×2 IMPLANT
KIT PREVENA INCISION MGT 13 (CANNISTER) IMPLANT
KIT TURNOVER KIT B (KITS) ×2 IMPLANT
MANIFOLD NEPTUNE II (INSTRUMENTS) ×2 IMPLANT
NS IRRIG 1000ML POUR BTL (IV SOLUTION) ×2 IMPLANT
PACK ORTHO EXTREMITY (CUSTOM PROCEDURE TRAY) ×2 IMPLANT
PAD ARMBOARD 7.5X6 YLW CONV (MISCELLANEOUS) ×2 IMPLANT
PREVENA RESTOR ARTHOFORM 46X30 (CANNISTER) ×2 IMPLANT
SPONGE LAP 18X18 RF (DISPOSABLE) IMPLANT
STAPLER VISISTAT 35W (STAPLE) IMPLANT
STOCKINETTE IMPERVIOUS LG (DRAPES) ×2 IMPLANT
SUT ETHILON 2 0 PSLX (SUTURE) IMPLANT
SUT SILK 2 0 (SUTURE) ×2
SUT SILK 2-0 18XBRD TIE 12 (SUTURE) ×1 IMPLANT
SUT VIC AB 1 CTX 27 (SUTURE) ×4 IMPLANT
TOWEL GREEN STERILE (TOWEL DISPOSABLE) ×2 IMPLANT
TUBE CONNECTING 12X1/4 (SUCTIONS) ×2 IMPLANT
YANKAUER SUCT BULB TIP NO VENT (SUCTIONS) ×2 IMPLANT

## 2019-10-23 NOTE — Progress Notes (Signed)
Pharmacy Antibiotic Note  Cory Palmer is a 59 y.o. male admitted on 10/22/2019 with osteomyelitis and bacteremia.  He left AMA 3/22 but is back now as he has surgery scheduled tomorrow for L BKA.  Plan: Vancomycin 2gm IV x 1 then resume previous dose vancomycin 1250mg  IV q12h Monitor renal function and cx data  Check Vancomycin levels at steady state  Height: 6\' 4"  (193 cm) Weight: 251 lb (113.9 kg) IBW/kg (Calculated) : 86.8  Temp (24hrs), Avg:98.8 F (37.1 C), Min:98.5 F (36.9 C), Max:99.1 F (37.3 C)  Recent Labs  Lab 10/19/19 1451 10/19/19 1804 10/20/19 0504 10/21/19 0228 10/22/19 1924  WBC 13.8*  --  10.2 8.4 10.9*  CREATININE 0.81  --  0.58* 0.66 0.67  LATICACIDVEN 4.5* 2.5*  --   --   --     Estimated Creatinine Clearance: 137.3 mL/min (by C-G formula based on SCr of 0.67 mg/dL).    No Known Allergies    Thank you for allowing pharmacy to be a part of this patient's care.  10/23/19 PharmD, BCPS 10/23/2019 1:29 AM

## 2019-10-23 NOTE — ED Provider Notes (Signed)
[facility] EMERGENCY DEPARTMENT Provider Note   CSN: [id number] Arrival date & time: 10/22/19  1748     History Chief Complaint  Patient presents with  . Needs Surgery    Cory Palmer is a 59 y.o. male.  The history is provided by the patient.  Foot Pain This is a recurrent problem. The current episode started more than 1 week ago. The problem occurs daily. The problem has been gradually worsening. Pertinent negatives include no chest pain and no shortness of breath. Exacerbated by: Movement and palpation. Nothing relieves the symptoms. He has tried nothing for the symptoms.   Patient with history of bipolar, diabetes, hypertension presents for surgery.  Patient was recently in the hospital, but left AGAINST MEDICAL ADVICE due to having to take care of issues at home.  He reports he was scheduled to have surgery on March 24 to remove his left foot No fevers or vomiting.  He reports continued pain to left foot.  No chest pain or abdominal pain. He was unaware he was supposed to be on antibiotics    Past Medical History:  Diagnosis Date  . Anxiety   . Depressed bipolar disorder ([place])   . Diabetes mellitus without complication ([place])   . Hypertension   . Osteomyelitis of toe of left foot [facility])     Patient Active Problem List   Diagnosis Date Noted  . Staphylococcus aureus bacteremia 10/21/2019  . Atrial flutter with rapid ventricular response ([place]) 10/21/2019  . Lactic acidosis 10/21/2019  . Severe protein-calorie malnutrition ([place])   . Diabetic polyneuropathy associated with type 2 diabetes mellitus ([place])   . Osteomyelitis of left foot ([place]) 10/19/2019  . Cirrhosis of liver ([place]) 10/19/2019  . Anemia 10/19/2019  . Medication monitoring encounter 08/15/2019  . Serum total bilirubin elevated 10/29/2018  . BPH (benign prostatic hyperplasia) 10/29/2018  . Non-pressure chronic ulcer of other part of left foot limited to breakdown of skin ([place])   .  Below-knee amputation of right lower extremity ([place])   . Hx of BKA, right ([place])   . PAF (paroxysmal atrial fibrillation) ([place])   . Hypokalemia   . Hypomagnesemia   . Sepsis without acute organ dysfunction ([place]) 10/05/2018  . Depressed bipolar disorder ([place]) 10/05/2018  . Hypertension 10/05/2018  . Hyperbilirubinemia 10/05/2018  . S/P transmetatarsal amputation of foot, left ([place]) 09/28/2018  . Wound infection 05/07/2018  . Homelessness 05/07/2018  . Diabetes mellitus type 2, noninsulin dependent ([place]) 05/07/2018    Past Surgical History:  Procedure Laterality Date  . AMPUTATION Left 09/28/2018   Procedure: LEFT TRANSMETATARSAL AMPUTATION;  Surgeon: Newt Minion, MD;  Location: [facility];  Service: Orthopedics;  Laterality: Left;  . BELOW KNEE LEG AMPUTATION Right        Family History  Problem Relation Age of Onset  . Hypertension Other   . Diabetes Mellitus II Other     Social History   Tobacco Use  . Smoking status: Current Every Day Smoker    Types: Cigarettes  . Smokeless tobacco: Never Used  Substance Use Topics  . Alcohol use: Yes    Comment: occasional  . Drug use: Yes    Types: Cocaine    Home Medications Prior to Admission medications   Medication Sig Start Date End Date Taking? Authorizing Provider  acetaminophen (TYLENOL) 325 MG tablet Take 2 tablets (650 mg total) by mouth every 6 (six) hours as needed for mild pain (or Fever >/= 101). 05/10/18  Yes Danford,  Suann Larry, MD  amoxicillin-clavulanate (AUGMENTIN) 875-125 MG tablet Take 1 tablet by mouth 2 (two) times daily for 14 days. 10/21/19 11/04/19 Yes Johnson, Clanford L, MD  Blood Glucose Monitoring Suppl (ACCU-CHEK AVIVA PLUS) w/Device KIT  05/23/19  Yes [provider]  carvedilol (COREG) 3.125 MG tablet Take 1 tablet (3.125 mg total) by mouth 2 (two) times daily with a meal. 10/21/19 11/20/19 Yes Johnson, Clanford L, MD  metFORMIN (GLUCOPHAGE) 1000 MG tablet Take 1 tablet (1,000 mg total) by  mouth daily. 10/21/19  Yes Johnson, Clanford L, MD  Multiple Vitamin (MULTIVITAMIN WITH MINERALS) TABS tablet Take 1 tablet by mouth daily. 05/11/18  Yes Danford, Suann Larry, MD  QUEtiapine (SEROQUEL) 300 MG tablet Take 300 mg by mouth at bedtime. 08/01/19  Yes [provider]  sertraline (ZOLOFT) 50 MG tablet Take 50 mg by mouth daily. 08/01/19  Yes [provider]  sulfamethoxazole-trimethoprim (BACTRIM DS) 800-160 MG tablet Take 2 tablets by mouth 2 (two) times daily for 14 days. 10/21/19 11/04/19 Yes Johnson, Clanford L, MD  traZODone (DESYREL) 150 MG tablet Take 150 mg by mouth at bedtime.   Yes [provider]    Allergies    Patient has no known allergies.  Review of Systems   Review of Systems  Constitutional: Negative for fever.  Respiratory: Negative for shortness of breath.   Cardiovascular: Negative for chest pain.  Gastrointestinal: Negative for vomiting.  Musculoskeletal: Positive for arthralgias.  All other systems reviewed and are negative.   Physical Exam Updated Vital Signs BP 106/82   Pulse 75   Temp 98.5 F (36.9 C) (Oral)   Resp (!) 22   Ht 1.93 m (6' 4")   Wt 113.9 kg   SpO2 96%   BMI 30.55 kg/m   Physical Exam CONSTITUTIONAL: Well develope HEAD: Normocephalic/atraumatic EYES: EOMI ENMT: Mucous membranes moist NECK: supple no meningeal signs SPINE/BACK:entire spine nontender CV: S1/S2 noted, no murmurs/rubs/gallops noted LUNGS: Lungs are clear to auscultation bilaterally, no apparent distress ABDOMEN: soft, nontender NEURO: Pt is awake/alert/appropriate, moves all extremitiesx4.  No facial droop.   EXTREMITIES: Left foot is bandaged. SKIN: warm, color normal PSYCH: Anxious ED Results / Procedures / Treatments   Labs (all labs ordered are listed, but only abnormal results are displayed) Labs Reviewed  CBC - Abnormal; Notable for the following components:      Result Value   WBC 10.9 (*)    RBC 2.78 (*)     Hemoglobin 8.5 (*)    HCT 25.9 (*)    All other components within normal limits  BASIC METABOLIC PANEL - Abnormal; Notable for the following components:   Sodium 132 (*)    CO2 19 (*)    Glucose, Bld 155 (*)    Calcium 8.2 (*)    All other components within normal limits  SARS CORONAVIRUS 2 (TAT 6-24 HRS)    EKG None  Radiology  Procedures Procedures  Medications Ordered in ED Medications - No data to display  ED Course  I have reviewed the triage vital signs and the nursing notes.  Pertinent labs results that were available during my care of the patient were reviewed by me and considered in my medical decision making (see chart for details).    MDM Rules/Calculators/A&P                      Patient presents after leaving AMA recently.  He was recently bacteremic and had left foot osteomyelitis.  He  was scheduled to have BKA today. We will keep patient n.p.o., discussed with Dr. Margaretmary Dys for admission Final Clinical Impression(s) / ED Diagnoses Final diagnoses:  Osteomyelitis of left foot, unspecified type [facility])    Rx / DC Orders ED Discharge Orders    None       Ripley Fraise, MD 10/23/19 [phone]

## 2019-10-23 NOTE — H&P (Signed)
History and Physical    Cory Palmer AJO:[id number] DOB: [birth year] DOA: 10/22/2019  PCP: Patient, [name] Per   Patient coming from: Home   Chief Complaint: Recently left AMA, wants to resume treatment   HPI: Cory Palmer is a 59 y.o. male with medical history significant for bipolar disorder, liver cirrhosis, type 2 diabetes mellitus, recent admission with osteomyelitis, MRSA bacteremia with left foot with suspected source, and was planned for surgery left AMA on 10/21/2019 now returns requesting resumption of his care.  Patient reports that he was able to attend to some business that had required to leave AMA, is now dedicated to treatment of his infection, and states that his brother is planning to come from out of town to assist him in his recovery.  Patient denies any fevers or chills, has not been taking any medications since leaving the hospital, and denies any chest pain, abdominal pain, cough, or shortness of breath.  He continues to have some bloody and purulent drainage from the left foot.  ED Course: Upon arrival to the ED, patient is found to be afebrile, saturating, stable blood pressure.  Chemistry panel with sodium 132 her bicarbonate is 19.  CBC with leukocytosis to 10,900 and stable normocytic anemia with hemoglobin 8.5.  Covid PCR screening test has been ordered hospitalists consulted for admission.  Review of Systems:  All other systems reviewed and apart from HPI, are negative.  Past Medical History:  Diagnosis Date  . Anxiety   . Depressed bipolar disorder ([place])   . Diabetes mellitus without complication ([place])   . Hypertension   . Osteomyelitis of toe of left foot [facility])     Past Surgical History:  Procedure Laterality Date  . AMPUTATION Left 09/28/2018   Procedure: LEFT TRANSMETATARSAL AMPUTATION;  Surgeon: Newt Minion, MD;  Location: [facility];  Service: Orthopedics;  Laterality: Left;  . BELOW KNEE LEG AMPUTATION Right      reports that he has been smoking  cigarettes. He has never used smokeless tobacco. He reports current alcohol use. He reports current drug use. Drug: Cocaine.  No Known Allergies  Family History  Problem Relation Age of Onset  . Hypertension Other   . Diabetes Mellitus II Other      Prior to Admission medications   Medication Sig Start Date End Date Taking? Authorizing Provider  acetaminophen (TYLENOL) 325 MG tablet Take 2 tablets (650 mg total) by mouth every 6 (six) hours as needed for mild pain (or Fever >/= 101). 05/10/18  Yes Danford, Suann Larry, MD  amoxicillin-clavulanate (AUGMENTIN) 875-125 MG tablet Take 1 tablet by mouth 2 (two) times daily for 14 days. 10/21/19 11/04/19 Yes Johnson, Clanford L, MD  Blood Glucose Monitoring Suppl (ACCU-CHEK AVIVA PLUS) w/Device KIT  05/23/19  Yes [provider]  carvedilol (COREG) 3.125 MG tablet Take 1 tablet (3.125 mg total) by mouth 2 (two) times daily with a meal. 10/21/19 11/20/19 Yes Johnson, Clanford L, MD  metFORMIN (GLUCOPHAGE) 1000 MG tablet Take 1 tablet (1,000 mg total) by mouth daily. 10/21/19  Yes Johnson, Clanford L, MD  Multiple Vitamin (MULTIVITAMIN WITH MINERALS) TABS tablet Take 1 tablet by mouth daily. 05/11/18  Yes Danford, Suann Larry, MD  QUEtiapine (SEROQUEL) 300 MG tablet Take 300 mg by mouth at bedtime. 08/01/19  Yes [provider]  sertraline (ZOLOFT) 50 MG tablet Take 50 mg by mouth daily. 08/01/19  Yes [provider]  sulfamethoxazole-trimethoprim (BACTRIM DS) 800-160 MG tablet Take 2 tablets by mouth  2 (two) times daily for 14 days. 10/21/19 11/04/19 Yes Johnson, Clanford L, MD  traZODone (DESYREL) 150 MG tablet Take 150 mg by mouth at bedtime.   Yes [provider]    Physical Exam: Vitals:   10/22/19 1909 10/22/19 2140 10/22/19 2329 10/23/19 0054  BP: 115/82 128/73 122/62 106/82  Pulse: 81 78 75 75  Resp: 16 16 18  (!) 22  Temp: 99.1 F (37.3 C)  98.5 F (36.9 C)   TempSrc: Oral  Oral   SpO2: 99% 96% 96%  96%  Weight:   113.9 kg   Height:   6' 4"  (1.93 m)     Constitutional: NAD, calm  Eyes: PERTLA, lids and conjunctivae normal ENMT: Mucous membranes are moist. Posterior pharynx clear of any exudate or lesions.   Neck: normal, supple, no masses, no thyromegaly Respiratory:  no wheezing, no crackles. No accessory muscle use.  Cardiovascular: S1 & S2 heard, regular rate and rhythm. Lower left leg swelling.   Abdomen: No distension, no tenderness, soft. Bowel sounds active.  Musculoskeletal: no clubbing / cyanosis. 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   Skin: Swelling and redness involving left mid-foot with bloody drainage from wound. Warm, dry, well-perfused. Neurologic: no facial asymmetry. Sensation intact. Moving all extremities.  Psychiatric: Alert and oriented to person, place, and situation. Calm, cooperative.    Labs and Imaging on Admission: I have personally reviewed following labs and imaging studies  CBC: Recent Labs  Lab 10/19/19 1451 10/20/19 0504 10/21/19 0228 10/22/19 1924  WBC 13.8* 10.2 8.4 10.9*  NEUTROABS 11.4*  --   --   --   HGB 9.4* 8.9* 8.2* 8.5*  HCT 29.0* 27.3* 25.2* 25.9*  MCV 94.2 93.8 93.0 93.2  PLT 278 220 207 945   Basic Metabolic Panel: Recent Labs  Lab 10/19/19 1451 10/19/19 1716 10/20/19 0504 10/21/19 0228 10/22/19 1924  NA 134*  --  132* 130* 132*  K 3.3*  --  3.4* 3.6 3.7  CL 101  --  104 103 102  CO2 19*  --  19* 19* 19*  GLUCOSE 167*  --  144* 180* 155*  BUN 7  --  6 6 8   CREATININE 0.81  --  0.58* 0.66 0.67  CALCIUM 8.2*  --  7.6* 7.6* 8.2*  MG  --  1.6* 1.8 1.6*  --    GFR: Estimated Creatinine Clearance: 137.3 mL/min (by C-G formula based on SCr of 0.67 mg/dL). Liver Function Tests: Recent Labs  Lab 10/19/19 1451 10/20/19 0504  AST 33 24  ALT 19 16  ALKPHOS 201* 170*  BILITOT 2.3* 2.1*  PROT 7.5 6.3*  ALBUMIN 2.6* 2.1*   Recent Labs  Lab 10/19/19 1657  LIPASE 25   No results for  input(s): AMMONIA in the last 168 hours. Coagulation Profile: No results for input(s): INR, PROTIME in the last 168 hours. Cardiac Enzymes: No results for input(s): CKTOTAL, CKMB, CKMBINDEX, TROPONINI in the last 168 hours. BNP (last 3 results) No results for input(s): PROBNP in the last 8760 hours. HbA1C: No results for input(s): HGBA1C in the last 72 hours. CBG: Recent Labs  Lab 10/20/19 1724 10/20/19 2101 10/21/19 0646 10/21/19 1126 10/21/19 1612  GLUCAP 151* 145* 128* 187* 133*   Lipid Profile: No results for input(s): CHOL, HDL, LDLCALC, TRIG, CHOLHDL, LDLDIRECT in the last 72 hours. Thyroid Function Tests: No results for input(s): TSH, T4TOTAL, FREET4, T3FREE, THYROIDAB in the last 72 hours. Anemia Panel: No results for input(s): VITAMINB12, FOLATE,  FERRITIN, TIBC, IRON, RETICCTPCT in the last 72 hours. Urine analysis:    Component Value Date/Time   COLORURINE AMBER (A) 10/05/2018 1153   APPEARANCEUR CLEAR 10/05/2018 1153   LABSPEC 1.034 (H) 10/05/2018 1153   PHURINE 5.0 10/05/2018 1153   GLUCOSEU NEGATIVE 10/05/2018 1153   HGBUR NEGATIVE 10/05/2018 1153   BILIRUBINUR SMALL (A) 10/05/2018 1153   KETONESUR 5 (A) 10/05/2018 1153   PROTEINUR 30 (A) 10/05/2018 1153   NITRITE NEGATIVE 10/05/2018 1153   LEUKOCYTESUR NEGATIVE 10/05/2018 1153   Sepsis Labs: @LABRCNTIP (procalcitonin:4,lacticidven:4) ) Recent Results (from the past 240 hour(s))  Blood culture (routine x 2)     Status: Abnormal   Collection Time: 10/19/19  2:51 PM   Specimen: BLOOD RIGHT FOREARM  Result Value Ref Range Status   Specimen Description   Final    BLOOD RIGHT FOREARM Performed at [facility] [address], [place], [place] [address]    Special Requests   Final    BOTTLES DRAWN AEROBIC AND ANAEROBIC Blood Culture adequate volume Performed at [facility] [address] [place], [place] [address]    Culture  Setup Time   Final    GRAM POSITIVE COCCI IN  CLUSTERS IN BOTH AEROBIC AND ANAEROBIC BOTTLES CRITICAL RESULT CALLED TO, READ BACK BY AND VERIFIED WITH: Frederickson [id number] AT [id number] BY CM Performed at [facility], [place] [address], [place], [place] [address]    Culture METHICILLIN RESISTANT STAPHYLOCOCCUS AUREUS (A)  Final   Report Status 10/22/2019 FINAL  Final   Organism ID, Bacteria METHICILLIN RESISTANT STAPHYLOCOCCUS AUREUS  Final      Susceptibility   Methicillin resistant staphylococcus aureus - MIC*    CIPROFLOXACIN 4 RESISTANT Resistant     ERYTHROMYCIN >=8 RESISTANT Resistant     GENTAMICIN <=0.5 SENSITIVE Sensitive     OXACILLIN >=4 RESISTANT Resistant     TETRACYCLINE >=16 RESISTANT Resistant     VANCOMYCIN <=0.5 SENSITIVE Sensitive     TRIMETH/SULFA <=10 SENSITIVE Sensitive     CLINDAMYCIN <=0.25 SENSITIVE Sensitive     RIFAMPIN <=0.5 SENSITIVE Sensitive     Inducible Clindamycin NEGATIVE Sensitive     * METHICILLIN RESISTANT STAPHYLOCOCCUS AUREUS  Respiratory Panel by RT PCR (Flu A&B, Covid) - Nasopharyngeal Swab     Status: None   Collection Time: 10/19/19  8:07 PM   Specimen: Nasopharyngeal Swab  Result Value Ref Range Status   SARS Coronavirus 2 by RT PCR NEGATIVE NEGATIVE Final    Comment: (NOTE) SARS-CoV-2 target nucleic acids are NOT DETECTED. The SARS-CoV-2 RNA is generally detectable in upper respiratoy specimens during the acute phase of infection. The lowest concentration of SARS-CoV-2 viral copies this assay can detect is 131 copies/mL. A negative result does not preclude SARS-Cov-2 infection and should not be used as the sole basis for treatment or other patient management decisions. A negative result may occur with  improper specimen collection/handling, submission of specimen other than nasopharyngeal swab, presence of viral mutation(s) within the areas targeted by this assay, and inadequate number of viral copies (<131 copies/mL). A negative result must be combined with  clinical observations, patient history, and epidemiological information. The expected result is Negative. Fact Sheet for Patients:  PinkCheek.be Fact Sheet for Healthcare Providers:  GravelBags.it This test is not yet ap proved or cleared by the [place] FDA and  has been authorized for detection and/or diagnosis of SARS-CoV-2 by FDA under an Emergency Use Authorization (EUA). This EUA will remain  in effect (meaning this test can be used) for the duration of the COVID-19 declaration under Section 564(b)(1) of the Act, 21 U.S.C. section 360bbb-3(b)(1), unless the authorization is terminated or revoked sooner.    Influenza A by PCR NEGATIVE NEGATIVE Final   Influenza B by PCR NEGATIVE NEGATIVE Final    Comment: (NOTE) The Xpert Xpress SARS-CoV-2/FLU/RSV assay is intended as an aid in  the diagnosis of influenza from Nasopharyngeal swab specimens and  should not be used as a sole basis for treatment. Nasal washings and  aspirates are unacceptable for Xpert Xpress SARS-CoV-2/FLU/RSV  testing. Fact Sheet for Patients: PinkCheek.be Fact Sheet for Healthcare Providers: GravelBags.it This test is not yet approved or cleared by the [place] FDA and  has been authorized for detection and/or diagnosis of SARS-CoV-2 by  FDA under an Emergency Use Authorization (EUA). This EUA will remain  in effect (meaning this test can be used) for the duration of the  Covid-19 declaration under Section 564(b)(1) of the Act, 21  U.S.C. section 360bbb-3(b)(1), unless the authorization is  terminated or revoked. Performed at [facility], [place] [address] [place], [place] [address]   Blood culture (routine x 2)     Status: None (Preliminary result)   Collection Time: 10/20/19  5:04 AM   Specimen: BLOOD  Result Value Ref Range Status   Specimen Description BLOOD  LEFT ANTECUBITAL  Final   Special Requests   Final    BOTTLES DRAWN AEROBIC ONLY Blood Culture adequate volume Performed at [facility] [address] [place], [place] [address]    Culture NO GROWTH 2 DAYS  Final   Report Status PENDING  Incomplete  Culture, blood (Routine X 2) w Reflex to ID Panel     Status: None (Preliminary result)   Collection Time: 10/21/19  4:35 PM   Specimen: BLOOD LEFT HAND  Result Value Ref Range Status   Specimen Description BLOOD LEFT HAND  Final   Special Requests   Final    BOTTLES DRAWN AEROBIC ONLY Blood Culture adequate volume   Culture NO GROWTH < 12 HOURS  Final   Report Status PENDING  Incomplete  Culture, blood (Routine X 2) w Reflex to ID Panel     Status: None (Preliminary result)   Collection Time: 10/21/19  4:35 PM   Specimen: BLOOD LEFT HAND  Result Value Ref Range Status   Specimen Description BLOOD LEFT HAND  Final   Special Requests   Final    BOTTLES DRAWN AEROBIC AND ANAEROBIC Blood Culture adequate volume   Culture NO GROWTH < 12 HOURS  Final   Report Status PENDING  Incomplete     Radiological Exams on Admission: ECHOCARDIOGRAM COMPLETE  Result Date: 10/21/2019    ECHOCARDIOGRAM REPORT   Patient Name:   GURNOOR URSUA Date of Exam: 10/21/2019 Medical Rec #:  [id number]     Height:       76.0 in Accession #:    [id number]    Weight:       251.1 lb Date of Birth:  14-Aug-1960    BSA:          2.440 m Patient Age:    31 years      BP:           112/70 mmHg Patient Gender: M             HR:           69 bpm. Exam Location:  Inpatient Procedure: 2D Echo, Cardiac Doppler and Color Doppler Indications:    Bacteremia 790.7 / R78.81  History:        Patient has prior history of Echocardiogram examinations, most                 recent 10/05/2018. Risk Factors:Hypertension, Diabetes and Current                 Smoker.  Sonographer:    Jonelle Sidle Dance Referring Phys: [id number] [place]  1. Left ventricular ejection  fraction, by estimation, is 60 to 65%. The left ventricle has normal function. The left ventricle has no regional wall motion abnormalities. There is mild left ventricular hypertrophy. Left ventricular diastolic function could not be evaluated. Left ventricular diastolic function could not be evaluated.  2. Right ventricular systolic function is normal. The right ventricular size is normal. There is mildly elevated pulmonary artery systolic pressure. The estimated right ventricular systolic pressure is 94.7 mmHg.  3. Left atrial size was moderately dilated.  4. The mitral valve is abnormal. Mild mitral valve regurgitation.  5. The aortic valve is tricuspid. Aortic valve regurgitation is not visualized. Mild aortic valve sclerosis is present, with no evidence of aortic valve stenosis.  6. The inferior vena cava is dilated in size with <50% respiratory variability, suggesting right atrial pressure of 15 mmHg.  7. Evidence of atrial level shunting detected by color flow Doppler. There is a possible small patent foramen ovale with predominantly left to right shunting across the atrial septum. Consider TEE bubble study if further work-up of bacteremia is considered.  8. No gross evidence of valvular endocarditis FINDINGS  Left Ventricle: Left ventricular ejection fraction, by estimation, is 60 to 65%. The left ventricle has normal function. The left ventricle has no regional wall motion abnormalities. The left ventricular internal cavity size was normal in size. There is  mild left ventricular hypertrophy. Left ventricular diastolic function could not be evaluated due to atrial fibrillation. Left ventricular diastolic function could not be evaluated. Right Ventricle: The right ventricular size is normal. No increase in right ventricular wall thickness. Right ventricular systolic function is normal. There is mildly elevated pulmonary artery systolic pressure. The tricuspid regurgitant velocity is 2.10  m/s, and with an  assumed right atrial pressure of 15 mmHg, the estimated right ventricular systolic pressure is 65.4 mmHg. Left Atrium: Left atrial size was moderately dilated. Right Atrium: Right atrial size was normal in size. Pericardium: There is no evidence of pericardial effusion. Mitral Valve: The mitral valve is abnormal. There is mild calcification of the mitral valve leaflet(s). Mild mitral annular calcification. Mild mitral valve regurgitation. Tricuspid Valve: The tricuspid valve is grossly normal. Tricuspid valve regurgitation is trivial. Aortic Valve: The aortic valve is tricuspid. Aortic valve regurgitation is not visualized. Mild aortic valve sclerosis is present, with no evidence of aortic valve stenosis. Pulmonic Valve: The pulmonic valve was grossly normal. Pulmonic valve regurgitation is not visualized. Aorta: The aortic root and ascending aorta are structurally normal, with no evidence of dilitation. Venous: The inferior vena cava is dilated in size with less than 50% respiratory variability, suggesting right atrial pressure of 15 mmHg. IAS/Shunts: Evidence of atrial level shunting detected by color flow Doppler. A small patent foramen ovale is detected with predominantly left to right shunting across the atrial septum.  LEFT VENTRICLE PLAX 2D LVIDd:         5.70 cm LVIDs:         3.56  cm LV PW:         1.59 cm LV IVS:        1.02 cm LVOT diam:     2.50 cm LV SV:         108 LV SV Index:   44 LVOT Area:     4.91 cm  RIGHT VENTRICLE             IVC RV Basal diam:  3.08 cm     IVC diam: 2.93 cm RV Mid diam:    2.71 cm RV S prime:     13.60 cm/s TAPSE (M-mode): 3.5 cm LEFT ATRIUM              Index       RIGHT ATRIUM           Index LA diam:        6.10 cm  2.50 cm/m  RA Area:     18.90 cm LA Vol (A2C):   102.0 ml 41.80 ml/m RA Volume:   45.80 ml  18.77 ml/m LA Vol (A4C):   102.0 ml 41.80 ml/m LA Biplane Vol: 103.0 ml 42.21 ml/m  AORTIC VALVE LVOT Vmax:   111.50 cm/s LVOT Vmean:  70.700 cm/s LVOT VTI:     0.220 m  AORTA Ao Root diam: 3.70 cm Ao Asc diam:  3.50 cm MITRAL VALVE                TRICUSPID VALVE MV Area (PHT): 2.56 cm     TR Peak grad:   17.6 mmHg MV Decel Time: 296 msec     TR Vmax:        210.00 cm/s MV E velocity: 118.00 cm/s                             SHUNTS                             Systemic VTI:  0.22 m                             Systemic Diam: 2.50 cm Lyman Bishop MD Electronically signed by Lyman Bishop MD Signature Date/Time: 10/21/2019/10:59:01 AM    Final     Assessment/Plan   1. MRSA bacteremia; left foot osteomyelitis  - Patient was admitted with worsening foot wound on 3/20, had plain films concerning for osteomyelitis, had blood culture from 3/20 growing MRSA, had no evidence for endocarditis on TTE, and left AMA on 3/22  - Repeat blood cultures from 3/22 are negative at time of H&P, MIC for vancomycin on prior culture <=0.5, will resume vancomycin   2. Paroxysmal atrial fibrillation  - Not anticoagulated prior to admission, CHADS-VASc may only be 1 (DM), but also poor candidate due to chronic anemia and poor adherence   3. Type II DM  - A1c was 6,8% earlier this month  - Check CBGs and use a low-intensity SSI for now    4. Hyponatremia  - Serum sodium is 132 in ED, similar to priors, appears euvolemic, likely related to cirrhosis, will monitor    5. Bipolar disorder  - Continue Seroquel, Zoloft, trazodone    6. Anemia  - Hgb 8.5, appears stable  - Patient denies melena or hematochezia; likely related to cirrhosis, chronic infection   7. Cirrhosis  - Appears  compensated    DVT prophylaxis: SCD Code Status: Full  Family Communication: Discussed with patient  Disposition Plan: Will likely require surgery this admission for source-control, should ideally have TEE but had recently refused this, and may need SNF if he undergoes BKA on his sole remaining leg Consults called: None  Admission status: Inpatient     Vianne Bulls, MD [facility] Pager: See [email or web address]  If 7AM-7PM, please contact the daytime attending [email or web address]  10/23/2019, 1:29 AM

## 2019-10-23 NOTE — Consult Note (Signed)
ORTHOPAEDIC CONSULTATION  REQUESTING PHYSICIAN: Elodia Florence., *  Chief Complaint: Osteomyelitis abscess ulceration left foot.  Neuro hydration.  I discussed this with the patient and updated plan of care.Marland Kitchen  HPI: Cory Palmer is a 59 y.o. male who presents with failure of limb salvage intervention on the left with ulceration abscess and osteomyelitis left foot status post transmetatarsal amputation in February 2020.  Patient was previously admitted and scheduled for surgery however patient left the hospital and returns at this time with worsening of his infection.  Past Medical History:  Diagnosis Date  . Anxiety   . Depressed bipolar disorder ([place])   . Diabetes mellitus without complication ([place])   . Hypertension   . Osteomyelitis of toe of left foot [facility])    Past Surgical History:  Procedure Laterality Date  . AMPUTATION Left 09/28/2018   Procedure: LEFT TRANSMETATARSAL AMPUTATION;  Surgeon: Newt Minion, MD;  Location: [facility];  Service: Orthopedics;  Laterality: Left;  . BELOW KNEE LEG AMPUTATION Right    Social History   Socioeconomic History  . Marital status: Single    Spouse name: Not on file  . Number of children: Not on file  . Years of education: Not on file  . Highest education level: Not on file  Occupational History  . Not on file  Tobacco Use  . Smoking status: Current Every Day Smoker    Types: Cigarettes  . Smokeless tobacco: Never Used  Substance and Sexual Activity  . Alcohol use: Yes    Comment: occasional  . Drug use: Yes    Types: Cocaine  . Sexual activity: Not on file  Other Topics Concern  . Not on file  Social History Narrative  . Not on file   Social Determinants of Health   Financial Resource Strain:   . Difficulty of Paying Living Expenses:   Food Insecurity:   . Worried About Charity fundraiser in the Last Year:   . Arboriculturist in the Last Year:   Transportation Needs:   . Film/video editor (Medical):   Marland Kitchen  Lack of Transportation (Non-Medical):   Physical Activity:   . Days of Exercise per Week:   . Minutes of Exercise per Session:   Stress:   . Feeling of Stress :   Social Connections:   . Frequency of Communication with Friends and Family:   . Frequency of Social Gatherings with Friends and Family:   . Attends Religious Services:   . Active Member of Clubs or Organizations:   . Attends Archivist Meetings:   Marland Kitchen Marital Status:    Family History  Problem Relation Age of Onset  . Hypertension Other   . Diabetes Mellitus II Other    - negative except otherwise stated in the family history section No Known Allergies Prior to Admission medications   Medication Sig Start Date End Date Taking? Authorizing Provider  acetaminophen (TYLENOL) 325 MG tablet Take 2 tablets (650 mg total) by mouth every 6 (six) hours as needed for mild pain (or Fever >/= 101). 05/10/18  Yes Danford, Suann Larry, MD  amoxicillin-clavulanate (AUGMENTIN) 875-125 MG tablet Take 1 tablet by mouth 2 (two) times daily for 14 days. 10/21/19 11/04/19 Yes Johnson, Clanford L, MD  Blood Glucose Monitoring Suppl (ACCU-CHEK AVIVA PLUS) w/Device KIT  05/23/19  Yes [provider]  carvedilol (COREG) 3.125 MG tablet Take 1 tablet (3.125 mg total) by mouth 2 (two) times daily with  a meal. 10/21/19 11/20/19 Yes Johnson, Clanford L, MD  metFORMIN (GLUCOPHAGE) 1000 MG tablet Take 1 tablet (1,000 mg total) by mouth daily. 10/21/19  Yes Johnson, Clanford L, MD  Multiple Vitamin (MULTIVITAMIN WITH MINERALS) TABS tablet Take 1 tablet by mouth daily. 05/11/18  Yes Danford, Suann Larry, MD  QUEtiapine (SEROQUEL) 300 MG tablet Take 300 mg by mouth at bedtime. 08/01/19  Yes [provider]  sertraline (ZOLOFT) 50 MG tablet Take 50 mg by mouth daily. 08/01/19  Yes [provider]  sulfamethoxazole-trimethoprim (BACTRIM DS) 800-160 MG tablet Take 2 tablets by mouth 2 (two) times daily for 14 days. 10/21/19 11/04/19  Yes Johnson, Clanford L, MD  traZODone (DESYREL) 150 MG tablet Take 150 mg by mouth at bedtime.   Yes [provider]   ECHOCARDIOGRAM COMPLETE  Result Date: 10/21/2019    ECHOCARDIOGRAM REPORT   Patient Name:   Cory Palmer Date of Exam: 10/21/2019 Medical Rec #:  [id number]     Height:       76.0 in Accession #:    [id number]    Weight:       251.1 lb Date of Birth:  Sep 19, 1960    BSA:          2.440 m Patient Age:    84 years      BP:           112/70 mmHg Patient Gender: M             HR:           69 bpm. Exam Location:  Inpatient Procedure: 2D Echo, Cardiac Doppler and Color Doppler Indications:    Bacteremia 790.7 / R78.81  History:        Patient has prior history of Echocardiogram examinations, most                 recent 10/05/2018. Risk Factors:Hypertension, Diabetes and Current                 Smoker.  Sonographer:    Jonelle Sidle Dance Referring Phys: [id number] [place]  1. Left ventricular ejection fraction, by estimation, is 60 to 65%. The left ventricle has normal function. The left ventricle has no regional wall motion abnormalities. There is mild left ventricular hypertrophy. Left ventricular diastolic function could not be evaluated. Left ventricular diastolic function could not be evaluated.  2. Right ventricular systolic function is normal. The right ventricular size is normal. There is mildly elevated pulmonary artery systolic pressure. The estimated right ventricular systolic pressure is 47.0 mmHg.  3. Left atrial size was moderately dilated.  4. The mitral valve is abnormal. Mild mitral valve regurgitation.  5. The aortic valve is tricuspid. Aortic valve regurgitation is not visualized. Mild aortic valve sclerosis is present, with no evidence of aortic valve stenosis.  6. The inferior vena cava is dilated in size with <50% respiratory variability, suggesting right atrial pressure of 15 mmHg.  7. Evidence of atrial level shunting detected by color flow Doppler. There is a  possible small patent foramen ovale with predominantly left to right shunting across the atrial septum. Consider TEE bubble study if further work-up of bacteremia is considered.  8. No gross evidence of valvular endocarditis FINDINGS  Left Ventricle: Left ventricular ejection fraction, by estimation, is 60 to 65%. The left ventricle has normal function. The left ventricle has no regional wall motion abnormalities. The left ventricular internal cavity size was normal in size. There is  mild  left ventricular hypertrophy. Left ventricular diastolic function could not be evaluated due to atrial fibrillation. Left ventricular diastolic function could not be evaluated. Right Ventricle: The right ventricular size is normal. No increase in right ventricular wall thickness. Right ventricular systolic function is normal. There is mildly elevated pulmonary artery systolic pressure. The tricuspid regurgitant velocity is 2.10  m/s, and with an assumed right atrial pressure of 15 mmHg, the estimated right ventricular systolic pressure is 40.9 mmHg. Left Atrium: Left atrial size was moderately dilated. Right Atrium: Right atrial size was normal in size. Pericardium: There is no evidence of pericardial effusion. Mitral Valve: The mitral valve is abnormal. There is mild calcification of the mitral valve leaflet(s). Mild mitral annular calcification. Mild mitral valve regurgitation. Tricuspid Valve: The tricuspid valve is grossly normal. Tricuspid valve regurgitation is trivial. Aortic Valve: The aortic valve is tricuspid. Aortic valve regurgitation is not visualized. Mild aortic valve sclerosis is present, with no evidence of aortic valve stenosis. Pulmonic Valve: The pulmonic valve was grossly normal. Pulmonic valve regurgitation is not visualized. Aorta: The aortic root and ascending aorta are structurally normal, with no evidence of dilitation. Venous: The inferior vena cava is dilated in size with less than 50% respiratory  variability, suggesting right atrial pressure of 15 mmHg. IAS/Shunts: Evidence of atrial level shunting detected by color flow Doppler. A small patent foramen ovale is detected with predominantly left to right shunting across the atrial septum.  LEFT VENTRICLE PLAX 2D LVIDd:         5.70 cm LVIDs:         3.56 cm LV PW:         1.59 cm LV IVS:        1.02 cm LVOT diam:     2.50 cm LV SV:         108 LV SV Index:   44 LVOT Area:     4.91 cm  RIGHT VENTRICLE             IVC RV Basal diam:  3.08 cm     IVC diam: 2.93 cm RV Mid diam:    2.71 cm RV S prime:     13.60 cm/s TAPSE (M-mode): 3.5 cm LEFT ATRIUM              Index       RIGHT ATRIUM           Index LA diam:        6.10 cm  2.50 cm/m  RA Area:     18.90 cm LA Vol (A2C):   102.0 ml 41.80 ml/m RA Volume:   45.80 ml  18.77 ml/m LA Vol (A4C):   102.0 ml 41.80 ml/m LA Biplane Vol: 103.0 ml 42.21 ml/m  AORTIC VALVE LVOT Vmax:   111.50 cm/s LVOT Vmean:  70.700 cm/s LVOT VTI:    0.220 m  AORTA Ao Root diam: 3.70 cm Ao Asc diam:  3.50 cm MITRAL VALVE                TRICUSPID VALVE MV Area (PHT): 2.56 cm     TR Peak grad:   17.6 mmHg MV Decel Time: 296 msec     TR Vmax:        210.00 cm/s MV E velocity: 118.00 cm/s                             SHUNTS  Systemic VTI:  0.22 m                             Systemic Diam: 2.50 cm Lyman Bishop MD Electronically signed by Lyman Bishop MD Signature Date/Time: 10/21/2019/10:59:01 AM    Final    - pertinent xrays, CT, MRI studies were reviewed and independently interpreted  Positive ROS: All other systems have been reviewed and were otherwise negative with the exception of those mentioned in the HPI and as above.  Physical Exam: General: Alert, no acute distress Psychiatric: Patient is competent for consent with normal mood and affect Lymphatic: No axillary or cervical lymphadenopathy Cardiovascular: No pedal edema Respiratory: No cyanosis, no use of accessory musculature GI: No  organomegaly, abdomen is soft and non-tender    Images:  _0 @  Labs:  Lab Results  Component Value Date   HGBA1C 6.8 (H) 10/19/2019   HGBA1C 6.6 (H) 09/28/2018   HGBA1C 7.5 (H) 05/08/2018   ESRSEDRATE 34 (H) 08/15/2019   ESRSEDRATE 31 (H) 08/05/2019   ESRSEDRATE 33 (H) 10/28/2018   CRP 15.0 (H) 08/15/2019   CRP 11.0 (H) 08/05/2019   CRP <0.8 10/28/2018   REPTSTATUS PENDING 10/21/2019   REPTSTATUS PENDING 10/21/2019   GRAMSTAIN NO WBC SEEN ABUNDANT GRAM POSITIVE COCCI  06/20/2019   CULT  10/21/2019    NO GROWTH 2 DAYS Performed at [facility], [place] [address] [place], [place] [address]    CULT  10/21/2019    NO GROWTH 2 DAYS Performed at [facility] [address] [place], [place] [address]    LABORGA METHICILLIN RESISTANT STAPHYLOCOCCUS AUREUS 10/19/2019    Lab Results  Component Value Date   ALBUMIN 1.9 (L) 10/23/2019   ALBUMIN 2.1 (L) 10/20/2019   ALBUMIN 2.6 (L) 10/19/2019   PREALBUMIN 11.2 (L) 05/08/2018    Neurologic: Patient does not have protective sensation bilateral lower extremities.   MUSCULOSKELETAL:   Skin: Examination patient has ulceration breakdown cellulitis swelling of the left transmetatarsal amputation.  Radiographic studies show infection involving the midfoot and hindfoot.  Patient's nutrition status is worsened his albumin is 1.9.  Assessment: Assessment: Diabetic insensate neuropathy with severe protein caloric malnutrition with osteomyelitis abscess and ulceration left foot.  Plan: Plan: Patient is scheduled for a left transtibial amputation at this time.  Risk and benefits were discussed including risk of the wound not healing.  Patient states he understands and wishes to proceed at this time.  Thank you for the consult and the opportunity to see Mr. Reynol Arnone, [place] [phone] 10:08 AM

## 2019-10-23 NOTE — Transfer of Care (Signed)
Immediate Anesthesia Transfer of Care Note  Patient: Cory Palmer  Procedure(s) Performed: AMPUTATION BELOW KNEE (Left Knee)  Patient Location: PACU  Anesthesia Type:MAC combined with regional for post-op pain  Level of Consciousness: drowsy and patient cooperative  Airway & Oxygen Therapy: Patient Spontanous Breathing and Patient connected to nasal cannula oxygen  Post-op Assessment: Report given to RN and Post -op Vital signs reviewed and stable  Post vital signs: Reviewed and stable  Last Vitals:  Vitals Value Taken Time  BP 93/52 10/23/19 1344  Temp 36.8 C 10/23/19 1342  Pulse 143 10/23/19 1345  Resp 17 10/23/19 1345  SpO2 95 % 10/23/19 1345  Vitals shown include unvalidated device data.  Last Pain:  Vitals:   10/23/19 1245  TempSrc:   PainSc: 3       Patients Stated Pain Goal: 0 (61/68/37 2902)  Complications: No apparent anesthesia complications

## 2019-10-23 NOTE — Progress Notes (Signed)
Orthopedic Tech Progress Note Patient Details:  Cory Palmer Dec 27, 1960 [id number] Called in order to [facility] for a VIVE PROTOCOL BK Patient ID: Cory Palmer, male   DOB: 1961/02/14, 59 y.o.   MRN: [id number]   [name] 10/23/2019, 2:51 PM

## 2019-10-23 NOTE — Anesthesia Procedure Notes (Signed)
Anesthesia Regional Block: Adductor canal block   Pre-Anesthetic Checklist: ,, timeout performed, Correct Patient, Correct Site, Correct Laterality, Correct Procedure, Correct Position, site marked, Risks and benefits discussed,  Surgical consent,  Pre-op evaluation,  At surgeon's request and post-op pain management  Laterality: Lower and Left  Prep: chloraprep       Needles:  Injection technique: Single-shot  Needle Type: Echogenic Needle     Needle Length: 9cm  Needle Gauge: 22     Additional Needles:   Procedures:,,,, ultrasound used (permanent image in chart),,,,  Narrative:  Start time: 10/23/2019 12:38 PM End time: 10/23/2019 12:43 PM Injection made incrementally with aspirations every 5 mL.  Performed by: Personally  Anesthesiologist: Trevor Iha, MD  Additional Notes: Block assessed prior to surgery. Pt tolerated procedure well.

## 2019-10-23 NOTE — Plan of Care (Signed)
  Problem: Education: Goal: Knowledge of General Education information will improve Description: Including pain rating scale, medication(s)/side effects and non-pharmacologic comfort measures Outcome: Progressing   Problem: Health Behavior/Discharge Planning: Goal: Ability to manage health-related needs will improve Outcome: Progressing   Problem: Clinical Measurements: Goal: Will remain free from infection Outcome: Progressing   Problem: Activity: Goal: Risk for activity intolerance will decrease Outcome: Progressing   Problem: Coping: Goal: Level of anxiety will decrease Outcome: Progressing   Problem: Elimination: Goal: Will not experience complications related to bowel motility Outcome: Progressing   Problem: Pain Managment: Goal: General experience of comfort will improve Outcome: Progressing   Problem: Safety: Goal: Ability to remain free from injury will improve Outcome: Progressing   Problem: Skin Integrity: Goal: Risk for impaired skin integrity will decrease Outcome: Progressing   

## 2019-10-23 NOTE — Progress Notes (Signed)
**Note De-identified vi Obfusction** PROGRESS NOTE    Cory Palmer  MRN:[id number] DOB: 01/02/1961 DOA: 10/22/2019 PCP: [name], [name] Per   Brief Nrrtive: [name] is  59 y.o. 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  [name] reports tht he ws ble to ttend to some business tht hd required to leve AMA, is now dedicted to tretment of his infection, nd sttes tht his brother is plnning to come from out of town to ssist him in his recovery.  [name] denies ny fevers or chills, hs not been tking ny medictions since leving the hospitl, nd denies ny chest pin, bdominl pin, cough, or shortness of breth.  He continues to hve some bloody nd purulent dringe from the left foot.  ED Course: Upon rrivl to the ED, ptient is found to be febrile, sturting, stble blood pressure.  Chemistry pnel with sodium 132 her bicrbonte is 19.  CBC with leukocytosis to 10,900 nd stble normocytic nemi with hemoglobin 8.5.  Covid PCR screening test hs been ordered hospitlists consulted for dmission.  Assessment & Pln:   Principl Problem:   MRSA bcteremi Active Problems:   Dibetes mellitus type II, non insulin dependent (HCC)   S/P trnsmettrsl mputtion of foot, left (HCC)   Depressed bipolr disorder (HCC)   PAF (proxysml tril fibrilltion) (HCC)   Osteomyelitis of left foot (HCC)   Hypontremi  1. MRSA bcteremi; left foot osteomyelitis  - [name] ws dmitted with worsening foot wound on 3/20 - plin films concerning for osteomyelitis - blood culture from 3/20 growing MRSA - repet blood cx 3/22 NGTD  - echo 3/22 -> EF 60-65% - evidence of tril level shunting.  Will request TEE by crdiology. - Continue vncomycin nd unsyn - ID  c/s, pprecite recs - recommending PICC on 3/25, TEE, continue bx - Ortho c/s, s/p trnstibil mputtion on 3/24  2. Proxysml tril fibrilltion  - Not nticogulted prior to dmission, CHADS-VASc my only be 1 (DM), but lso poor cndidte due to chronic nemi nd poor dherence   3. Type II DM  - A1c ws 6,8% erlier this month  - Check CBGs nd use  low-intensity SSI for now    4. Hypontremi  - Serum sodium is 132 in ED, similr to priors, ppers euvolemic, likely relted to cirrhosis, will monitor    5. Bipolr disorder  - Continue Seroquel, Zoloft, trzodone    6. Anemi  - Trend H/H - iron, b12, folte, ferritin - [name] denies melen or hemtochezi; likely relted to cirrhosis, chronic infection   7. Cirrhosis  - Appers compensted   DVT prophylxis: SCD Code Sttus: full Fmily Communiction: none t bedside Disposition Pln:  . [name] cme from: home            . Anticipted d/c plce:home . Brriers to d/c OR conditions which need to be met to effect  sfe d/c: pending TEE, clernce by ortho   Consultnts:   orthopedics  Procedures:  3/24 - s/p L trnstibil mputtion  Antimicrobils:  Anti-infectives (From dmission, onwrd)   Strt     Dose/Rte Route Frequency Ordered Stop   10/23/19 1400  vncomycin (VANCOREADY) IVPB 1250 mg/250 mL     1,250 mg 166.7 mL/hr over 90 Minutes Intrvenous Every 12 hours 10/23/19 0129     10/23/19   1030  ceFAZolin (ANCEF) IVPB 2g/100 mL premix  Status:  Discontinued     2 g 200 mL/hr over 30 Minutes Intravenous On call to O.R. 10/23/19 1026 10/23/19 1412   10/23/19 1000  Ampicillin-Sulbactam (UNASYN) 3 g in sodium chloride 0.9 % 100 mL IVPB     3 g 200 mL/hr over 30 Minutes Intravenous Every 6 hours 10/23/19 0858     10/23/19 0130  vancomycin (VANCOREADY) IVPB 2000 mg/400 mL     2,000 mg 200 mL/hr over 120 Minutes Intravenous  Once 10/23/19 0128 10/23/19 0630     Subjective: No new  complaints  Objective: Vitals:   10/23/19 1245 10/23/19 1342 10/23/19 1356 10/23/19 1437  BP: 116/65 (!) 93/52 113/69 110/63  Pulse: 61 (!) 54 (!) 56 65  Resp: 20 16 15 17  Temp:  98.2 F (36.8 C) 97.9 F (36.6 C) 97.8 F (36.6 C)  TempSrc:    Oral  SpO2: 99% 95% 96% 99%  Weight:      Height:        Intake/Output Summary (Last 24 hours) at 10/23/2019 1624 Last data filed at 10/23/2019 1500 Gross per 24 hour  Intake 696.78 ml  Output 1500 ml  Net -803.22 ml   Filed Weights   10/22/19 2329  Weight: 113.9 kg    Examination:  General exam: Appears calm and comfortable  Respiratory system: Clear to auscultation. Respiratory effort normal. Cardiovascular system: S1 & S2 heard, RRR.  Gastrointestinal system: Abdomen is nondistended, soft and nontender.  Central nervous system: Alert and oriented. No focal neurological deficits. Extremities: LLE with dressing intact  Data Reviewed: I have personally reviewed following labs and imaging studies  CBC: Recent Labs  Lab 10/19/19 1451 10/20/19 0504 10/21/19 0228 10/22/19 1924 10/23/19 0532  WBC 13.8* 10.2 8.4 10.9* 7.6  NEUTROABS 11.4*  --   --   --  5.1  HGB 9.4* 8.9* 8.2* 8.5* 7.7*  HCT 29.0* 27.3* 25.2* 25.9* 23.1*  MCV 94.2 93.8 93.0 93.2 93.1  PLT 278 220 207 243 201   Basic Metabolic Panel: Recent Labs  Lab 10/19/19 1451 10/19/19 1716 10/20/19 0504 10/21/19 0228 10/22/19 1924 10/23/19 0532  NA 134*  --  132* 130* 132* 138  K 3.3*  --  3.4* 3.6 3.7 3.4*  CL 101  --  104 103 102 108  CO2 19*  --  19* 19* 19* 22  GLUCOSE 167*  --  144* 180* 155* 115*  BUN 7  --  6 6 8 6  CREATININE 0.81  --  0.58* 0.66 0.67 0.64  CALCIUM 8.2*  --  7.6* 7.6* 8.2* 8.0*  MG  --  1.6* 1.8 1.6*  --  1.7   GFR: Estimated Creatinine Clearance: 137.3 mL/min (by C-G formula based on SCr of 0.64 mg/dL). Liver Function Tests: Recent Labs  Lab 10/19/19 1451 10/20/19 0504 10/23/19 0532  AST 33 24 31  ALT 19 16 17  ALKPHOS  201* 170* 184*  BILITOT 2.3* 2.1* 1.3*  PROT 7.5 6.3* 6.2*  ALBUMIN 2.6* 2.1* 1.9*   Recent Labs  Lab 10/19/19 1657  LIPASE 25   No results for input(s): AMMONIA in the last 168 hours. Coagulation Profile: Recent Labs  Lab 10/23/19 0532  INR 1.4*   Cardiac Enzymes: No results for input(s): CKTOTAL, CKMB, CKMBINDEX, TROPONINI in the last 168 hours. BNP (last 3 results) No results for input(s): PROBNP in the last 8760 hours. HbA1C: No results for input(s): HGBA1C in the last   72 hours. CBG: Recent Labs  Lab 10/21/19 1126 10/21/19 1612 10/23/19 0315 10/23/19 0817 10/23/19 1139  GLUCP 187* 133* 143* 93 103*   Lipid Profile: No results for input(s): CHOL, HDL, LDLCLC, TRIG, CHOLHDL, LDLDIRECT in the last 72 hours. Thyroid Function Tests: No results for input(s): TSH, T4TOTL, FREET4, T3FREE, THYROIDB in the last 72 hours. nemia Panel: No results for input(s): VITMINB12, FOLTE, FERRITIN, TIBC, IRON, RETICCTPCT in the last 72 hours. Sepsis Labs: Recent Labs  Lab 10/19/19 1451 10/19/19 1804  LTICCIDVEN 4.5* 2.5*    Recent Results (from the past 240 hour(s))  Blood culture (routine x 2)     Status: bnormal   Collection Time: 10/19/19  2:51 PM   Specimen: BLOOD RIGHT FORERM  Result Value Ref Range Status   Specimen Description   Final    BLOOD RIGHT FORERM Performed at [facility], [address] [place], [place] [address]    Special Requests   Final    BOTTLES DRWN EROBIC ND NEROBIC Blood Culture adequate volume Performed at [facility], [address] [place], [place] [address]    Culture  Setup Time   Final    GRM POSITIVE COCCI IN CLUSTERS IN BOTH EROBIC ND NEROBIC BOTTLES CRITICL RESULT CLLED TO, RED BCK BY ND VERIFIED WITH: PHRMD M LILLISTON [id number] T 1124 BY CM Performed at [facility], [address] [place], [place] [address]    Culture METHICILLIN RESISTNT STPHYLOCOCCUS UREUS ()   Final   Report Status 10/22/2019 FINL  Final   Organism ID, Bacteria METHICILLIN RESISTNT STPHYLOCOCCUS UREUS  Final      Susceptibility   Methicillin resistant staphylococcus aureus - MIC*    CIPROFLOXCIN 4 RESISTNT Resistant     ERYTHROMYCIN >=8 RESISTNT Resistant     GENTMICIN <=0.5 SENSITIVE Sensitive     OXCILLIN >=4 RESISTNT Resistant     TETRCYCLINE >=16 RESISTNT Resistant     VNCOMYCIN <=0.5 SENSITIVE Sensitive     TRIMETH/SULF <=10 SENSITIVE Sensitive     CLINDMYCIN <=0.25 SENSITIVE Sensitive     RIFMPIN <=0.5 SENSITIVE Sensitive     Inducible Clindamycin NEGTIVE Sensitive     * METHICILLIN RESISTNT STPHYLOCOCCUS UREUS  Respiratory Panel by RT PCR (Flu &B, Covid) - Nasopharyngeal Swab     Status: None   Collection Time: 10/19/19  8:07 PM   Specimen: Nasopharyngeal Swab  Result Value Ref Range Status   SRS Coronavirus 2 by RT PCR NEGTIVE NEGTIVE Final    Comment: (NOTE) SRS-CoV-2 target nucleic acids are NOT DETECTED. The SRS-CoV-2 RN is generally detectable in upper respiratoy specimens during the acute phase of infection. The lowest concentration of SRS-CoV-2 viral copies this assay can detect is 131 copies/mL.  negative result does not preclude SRS-Cov-2 infection and should not be used as the sole basis for treatment or other patient management decisions.  negative result may occur with  improper specimen collection/handling, submission of specimen other than nasopharyngeal swab, presence of viral mutation(s) within the areas targeted by this assay, and inadequate number of viral copies (<131 copies/mL).  negative result must be combined with clinical observations, patient history, and epidemiological information. The expected result is Negative. Fact Sheet for Patients:  https://www.fda.gov/media/142436/download Fact Sheet for Healthcare Providers:  https://www.fda.gov/media/142435/download This test is not yet ap proved or cleared  by the [place] FD and  has been authorized for detection and/or diagnosis of SRS-CoV-2 by FD under an Emergency Use uthorization (EU). This EU will remain   **Note De-identified vi Obfusction** in effect (mening this test cn be used) for the durtion of the COVID-19 declrtion under Section 564(b)(1) of the ct, 21 U.S.C. section 360bbb-3(b)(1), unless the uthoriztion is terminted or revoked sooner.    Influenz  by PCR NEGTIVE NEGTIVE Finl   Influenz B by PCR NEGTIVE NEGTIVE Finl    Comment: (NOTE) The Xpert Xpress SRS-CoV-2/FLU/RSV ssy is intended s n id in  the dignosis of influenz from Nsophryngel swb specimens nd  should not be used s  sole bsis for tretment. Nsl wshings nd  spirtes re uncceptble for Xpert Xpress SRS-CoV-2/FLU/RSV  testing. Fct Sheet for Ptients: https://www.fd.gov/medi/142436/downlod Fct Sheet for [place] Providers: https://www.fd.gov/medi/142435/downlod This test is not yet pproved or clered by the [place] FD nd  hs been uthorized for detection nd/or dignosis of SRS-CoV-2 by  FD under n Emergency Use uthoriztion (EU). This EU will remin  in effect (mening this test cn be used) for the durtion of the  Covid-19 declrtion under Section 564(b)(1) of the ct, 21  U.S.C. section 360bbb-3(b)(1), unless the uthoriztion is  terminted or revoked. Performed t [facility], [address] [place], [place] [address]   Blood culture (routine x 2)     Sttus: None (Preliminry result)   Collection Time: 10/20/19  5:04 M   Specimen: BLOOD  Result Vlue Ref Rnge Sttus   Specimen Description   Finl    BLOOD LEFT NTECUBITL Performed t [facility], [address] [place], [place] [address]    Specil Requests   Finl    BOTTLES DRWN EROBIC ONLY Blood Culture dequte volume Performed t [facility], [address] [place], [place] [address]     Culture   Finl    NO GROWTH 3 DYS Performed t [facility], [address] [place], [place] [address]    Report Sttus PENDING  Incomplete  Culture, blood (Routine X 2) w Reflex to ID Pnel     Sttus: None (Preliminry result)   Collection Time: 10/21/19  4:35 PM   Specimen: BLOOD LEFT HND  Result Vlue Ref Rnge Sttus   Specimen Description BLOOD LEFT HND  Finl   Specil Requests   Finl    BOTTLES DRWN EROBIC ONLY Blood Culture dequte volume   Culture   Finl    NO GROWTH 2 DYS Performed t [facility], [address] [place], [place] [address]    Report Sttus PENDING  Incomplete  Culture, blood (Routine X 2) w Reflex to ID Pnel     Sttus: None (Preliminry result)   Collection Time: 10/21/19  4:35 PM   Specimen: BLOOD LEFT HND  Result Vlue Ref Rnge Sttus   Specimen Description BLOOD LEFT HND  Finl   Specil Requests   Finl    BOTTLES DRWN EROBIC ND NEROBIC Blood Culture dequte volume   Culture   Finl    NO GROWTH 2 DYS Performed t  [facility], [address] [place], [place] [address]    Report Sttus PENDING  Incomplete  SRS CORONVIRUS 2 (TT 6-24 HRS) Nsophryngel Nsophryngel Swb     Sttus: None   Collection Time: 10/23/19  1:30 M   Specimen: Nsophryngel Swb  Result Vlue Ref Rnge Sttus   SRS Coronvirus 2 NEGTIVE NEGTIVE Finl    Comment: (NOTE) SRS-CoV-2 trget nucleic cids re NOT DETECTED. The SRS-CoV-2 RN is generlly detectble in upper nd lower respirtory specimens during the cute phse of infection.   Negative results do not preclude SARS-CoV-2 infection, do not rule out co-infections with other pathogens, and should not be used as the sole basis for treatment or other patient management decisions. Negative results must be combined with clinical observations, patient history, and epidemiological information. The expected result is Negative. Fact Sheet for  Patients: SugarRoll.be Fact Sheet for Healthcare Providers: https://www.woods-mathews.com/ This test is not yet approved or cleared by the [place] FDA and  has been authorized for detection and/or diagnosis of SARS-CoV-2 by FDA under an Emergency Use Authorization (EUA). This EUA will remain  in effect (meaning this test can be used) for the duration of the COVID-19 declaration under Section 56 4(b)(1) of the Act, 21 U.S.C. section 360bbb-3(b)(1), unless the authorization is terminated or revoked sooner. Performed at [facility], [place] [address], [place], [place] [address]   Surgical pcr screen     Status: Abnormal   Collection Time: 10/23/19  3:27 AM   Specimen: Nasal Mucosa; Nasal Swab  Result Value Ref Range Status   MRSA, PCR POSITIVE (Romana Deaton) NEGATIVE Final    Comment: RESULT CALLED TO, READ BACK BY AND VERIFIED WITH: COLLIE,L RN [phone] 10/23/2019 MITCHELL,L    Staphylococcus aureus POSITIVE (Karlisha Mathena) NEGATIVE Final    Comment: (NOTE) The Xpert SA Assay (FDA approved for NASAL specimens in patients 6 years of age and older), is one component of [place] [facility] comprehensive surveillance program. It is not intended to diagnose infection nor to guide or monitor treatment.          Radiology Studies: No results found.      Scheduled Meds: . Chlorhexidine Gluconate Cloth  6 each Topical Q0600  . docusate sodium  100 mg Oral BID  . insulin aspart  0-9 Units Subcutaneous Q4H  . mupirocin ointment  1 application Nasal BID  . QUEtiapine  300 mg Oral QHS  . sertraline  50 mg Oral Daily  . traZODone  150 mg Oral QHS   Continuous Infusions: . sodium chloride 75 mL/hr at 10/23/19 1457  . ampicillin-sulbactam (UNASYN) IV Stopped (10/23/19 1111)  . lactated ringers 500 mL/hr at 10/23/19 1329  . vancomycin 1,250 mg (10/23/19 1503)     LOS: 0 days    Time spent: over 30 min    Fayrene Helper, MD [facility]   To contact the  attending provider between 7A-7P or the covering provider during after hours 7P-7A, please log into the web site [email or web address] and access using universal [place] password for that web site. If you do not have the password, please call the hospital operator.  10/23/2019, 4:24 PM

## 2019-10-23 NOTE — Plan of Care (Signed)

## 2019-10-23 NOTE — Progress Notes (Signed)
Patient ID: Cory Palmer, male   DOB: 1960-08-22, 59 y.o.   MRN: [id number]         [facility] for Infectious Disease  Date of Admission:  10/22/2019   Total days of antibiotics 4         ASSESSMENT: Cory Palmer has a left diabetic foot infection complicated by MRSA bacteremia.  He is scheduled to undergo a left BKA this afternoon.  Repeat blood cultures are negative at 48 hours.  He will need a PICC line placed tomorrow.  He also needs a TEE to help determine optimal duration of his bacteremia.  PLAN: 1. Continue vancomycin and ampicillin sulbactam for now 2. PICC placement tomorrow 3. Recommend TEE  Principal Problem:   MRSA bacteremia Active Problems:   S/P transmetatarsal amputation of foot, left (HCC)   Osteomyelitis of left foot (HCC)   Diabetes mellitus type II, non insulin dependent (HCC)   Depressed bipolar disorder (HCC)   PAF (paroxysmal atrial fibrillation) (HCC)   Hyponatremia   Scheduled Meds: . chlorhexidine  60 mL Topical Once  . Chlorhexidine Gluconate Cloth  6 each Topical Q0600  . insulin aspart  0-9 Units Subcutaneous Q4H  . mupirocin ointment  1 application Nasal BID  . QUEtiapine  300 mg Oral QHS  . sertraline  50 mg Oral Daily  . traZODone  150 mg Oral QHS   Continuous Infusions: . 0.9 % NaCl with KCl 20 mEq / L 75 mL/hr at 10/23/19 0427  . ampicillin-sulbactam (UNASYN) IV 3 g (10/23/19 1037)  .  ceFAZolin (ANCEF) IV    . vancomycin     PRN Meds:.acetaminophen **OR** acetaminophen, ondansetron **OR** ondansetron (ZOFRAN) IV, senna-docusate   SUBJECTIVE: Cory Palmer returned to the hospital last night after leaving AGAINST MEDICAL ADVICE 2 days ago.  He said that he did have some increased swelling and drainage from his left foot.  Review of Systems: Review of Systems  Constitutional: Negative for fever.  Gastrointestinal: Negative for abdominal pain, diarrhea, nausea and vomiting.  Musculoskeletal: Negative for joint pain.    No Known  Allergies  OBJECTIVE: Vitals:   10/22/19 2329 10/23/19 0054 10/23/19 0217 10/23/19 0756  BP: 122/62 106/82 126/78 119/60  Pulse: 75 75 68 60  Resp: 18 (!) 22 16 17   Temp: 98.5 F (36.9 C)  97.7 F (36.5 C) 98.3 F (36.8 C)  TempSrc: Oral  Oral Oral  SpO2: 96% 96% 97% 96%  Weight: 113.9 kg     Height: 6\' 4"  (1.93 m)      Body mass index is 30.55 kg/m.  Physical Exam Constitutional:      Comments: He is pleasant and in no distress resting quietly in bed.  Cardiovascular:     Rate and Rhythm: Normal rate and regular rhythm.     Heart sounds: No murmur.  Pulmonary:     Effort: Pulmonary effort is normal.     Breath sounds: Normal breath sounds.  Musculoskeletal:     Comments: He has had a previous left transmetatarsal amputation.  Bloody drainage from the wound left heel.  His left foot and ankle are swollen and warm.     Lab Results Lab Results  Component Value Date   WBC 7.6 10/23/2019   HGB 7.7 (L) 10/23/2019   HCT 23.1 (L) 10/23/2019   MCV 93.1 10/23/2019   PLT 201 10/23/2019    Lab Results  Component Value Date   CREATININE 0.64 10/23/2019   BUN 6 10/23/2019   NA  138 10/23/2019   K 3.4 (L) 10/23/2019   CL 108 10/23/2019   CO2 22 10/23/2019    Lab Results  Component Value Date   ALT 17 10/23/2019   AST 31 10/23/2019   ALKPHOS 184 (H) 10/23/2019   BILITOT 1.3 (H) 10/23/2019     Microbiology: Recent Results (from the past 240 hour(s))  Blood culture (routine x 2)     Status: Abnormal   Collection Time: 10/19/19  2:51 PM   Specimen: BLOOD RIGHT FOREARM  Result Value Ref Range Status   Specimen Description   Final    BLOOD RIGHT FOREARM Performed at [facility], [address], [place], [place] [address]    Special Requests   Final    BOTTLES DRAWN AEROBIC AND ANAEROBIC Blood Culture adequate volume Performed at [facility], [address] [place] [address] [place], [place] [address]    Culture  Setup Time   Final    GRAM POSITIVE COCCI  IN CLUSTERS IN BOTH AEROBIC AND ANAEROBIC BOTTLES CRITICAL RESULT CALLED TO, READ BACK BY AND VERIFIED WITH: PHARMD Damaris Hippo [id number] AT 1124 BY CM Performed at [facility], [address], [place], [place] [address]    Culture METHICILLIN RESISTANT STAPHYLOCOCCUS AUREUS (A)  Final   Report Status 10/22/2019 FINAL  Final   Organism ID, Bacteria METHICILLIN RESISTANT STAPHYLOCOCCUS AUREUS  Final      Susceptibility   Methicillin resistant staphylococcus aureus - MIC*    CIPROFLOXACIN 4 RESISTANT Resistant     ERYTHROMYCIN >=8 RESISTANT Resistant     GENTAMICIN <=0.5 SENSITIVE Sensitive     OXACILLIN >=4 RESISTANT Resistant     TETRACYCLINE >=16 RESISTANT Resistant     VANCOMYCIN <=0.5 SENSITIVE Sensitive     TRIMETH/SULFA <=10 SENSITIVE Sensitive     CLINDAMYCIN <=0.25 SENSITIVE Sensitive     RIFAMPIN <=0.5 SENSITIVE Sensitive     Inducible Clindamycin NEGATIVE Sensitive     * METHICILLIN RESISTANT STAPHYLOCOCCUS AUREUS  Respiratory Panel by RT PCR (Flu A&B, Covid) - Nasopharyngeal Swab     Status: None   Collection Time: 10/19/19  8:07 PM   Specimen: Nasopharyngeal Swab  Result Value Ref Range Status   SARS Coronavirus 2 by RT PCR NEGATIVE NEGATIVE Final    Comment: (NOTE) SARS-CoV-2 target nucleic acids are NOT DETECTED. The SARS-CoV-2 RNA is generally detectable in upper respiratoy specimens during the acute phase of infection. The lowest concentration of SARS-CoV-2 viral copies this assay can detect is 131 copies/mL. A negative result does not preclude SARS-Cov-2 infection and should not be used as the sole basis for treatment or other patient management decisions. A negative result may occur with  improper specimen collection/handling, submission of specimen other than nasopharyngeal swab, presence of viral mutation(s) within the areas targeted by this assay, and inadequate number of viral copies (<131 copies/mL). A negative result must be combined with  clinical observations, patient history, and epidemiological information. The expected result is Negative. Fact Sheet for Patients:  https://www.moore.com/ Fact Sheet for Healthcare Providers:  https://www.young.biz/ This test is not yet ap proved or cleared by the [place] FDA and  has been authorized for detection and/or diagnosis of SARS-CoV-2 by FDA under an Emergency Use Authorization (EUA). This EUA will remain  in effect (meaning this test can be used) for the duration of the COVID-19 declaration under Section 564(b)(1) of the Act, 21 U.S.C. section 360bbb-3(b)(1), unless the authorization is terminated or revoked sooner.    Influenza A by  PCR NEGATIVE NEGATIVE Final   Influenza B by PCR NEGATIVE NEGATIVE Final    Comment: (NOTE) The Xpert Xpress SARS-CoV-2/FLU/RSV assay is intended as an aid in  the diagnosis of influenza from Nasopharyngeal swab specimens and  should not be used as a sole basis for treatment. Nasal washings and  aspirates are unacceptable for Xpert Xpress SARS-CoV-2/FLU/RSV  testing. Fact Sheet for Patients: PinkCheek.be Fact Sheet for Healthcare Providers: GravelBags.it This test is not yet approved or cleared by the [place] FDA and  has been authorized for detection and/or diagnosis of SARS-CoV-2 by  FDA under an Emergency Use Authorization (EUA). This EUA will remain  in effect (meaning this test can be used) for the duration of the  Covid-19 declaration under Section 564(b)(1) of the Act, 21  U.S.C. section 360bbb-3(b)(1), unless the authorization is  terminated or revoked. Performed at [facility], [place] [address], [place], [place] [address]   Blood culture (routine x 2)     Status: None (Preliminary result)   Collection Time: 10/20/19  5:04 AM   Specimen: BLOOD  Result Value Ref Range Status   Specimen Description   Final     BLOOD LEFT ANTECUBITAL Performed at [place] [address], [place], [place] [address]    Special Requests   Final    BOTTLES DRAWN AEROBIC ONLY Blood Culture adequate volume Performed at [place] [address] [place], [place] [address]    Culture   Final    NO GROWTH 3 DAYS Performed at [facility], [place] [address], [place], [place] [address]    Report Status PENDING  Incomplete  Culture, blood (Routine X 2) w Reflex to ID Panel     Status: None (Preliminary result)   Collection Time: 10/21/19  4:35 PM   Specimen: BLOOD LEFT HAND  Result Value Ref Range Status   Specimen Description BLOOD LEFT HAND  Final   Special Requests   Final    BOTTLES DRAWN AEROBIC ONLY Blood Culture adequate volume   Culture   Final    NO GROWTH 2 DAYS Performed at [facility], [place] [address], [place], [place] [address]    Report Status PENDING  Incomplete  Culture, blood (Routine X 2) w Reflex to ID Panel     Status: None (Preliminary result)   Collection Time: 10/21/19  4:35 PM   Specimen: BLOOD LEFT HAND  Result Value Ref Range Status   Specimen Description BLOOD LEFT HAND  Final   Special Requests   Final    BOTTLES DRAWN AEROBIC AND ANAEROBIC Blood Culture adequate volume   Culture   Final    NO GROWTH 2 DAYS Performed at [facility], [place] [address] [place], [place] [address]    Report Status PENDING  Incomplete  SARS CORONAVIRUS 2 (TAT 6-24 HRS) Nasopharyngeal Nasopharyngeal Swab     Status: None   Collection Time: 10/23/19  1:30 AM   Specimen: Nasopharyngeal Swab  Result Value Ref Range Status   SARS Coronavirus 2 NEGATIVE NEGATIVE Final    Comment: (NOTE) SARS-CoV-2 target nucleic acids are NOT DETECTED. The SARS-CoV-2 RNA is generally detectable in upper and lower respiratory specimens during the acute phase of infection. Negative results do not preclude SARS-CoV-2 infection, do not rule out co-infections with  other pathogens, and should not be used as the sole basis for treatment or other patient management decisions. Negative results must be combined with clinical  observations, patient history, and epidemiological information. The expected result is Negative. Fact Sheet for Patients: HairSlick.no Fact Sheet for Healthcare Providers: quierodirigir.com This test is not yet approved or cleared by the [place] FDA and  has been authorized for detection and/or diagnosis of SARS-CoV-2 by FDA under an Emergency Use Authorization (EUA). This EUA will remain  in effect (meaning this test can be used) for the duration of the COVID-19 declaration under Section 56 4(b)(1) of the Act, 21 U.S.C. section 360bbb-3(b)(1), unless the authorization is terminated or revoked sooner. Performed at [facility], [address], [place], [place] [address]   Surgical pcr screen     Status: Abnormal   Collection Time: 10/23/19  3:27 AM   Specimen: Nasal Mucosa; Nasal Swab  Result Value Ref Range Status   MRSA, PCR POSITIVE (A) NEGATIVE Final    Comment: RESULT CALLED TO, READ BACK BY AND VERIFIED WITH: COLLIE,L RN [phone] 10/23/2019 MITCHELL,L    Staphylococcus aureus POSITIVE (A) NEGATIVE Final    Comment: (NOTE) The Xpert SA Assay (FDA approved for NASAL specimens in patients 79 years of age and older), is one component of a comprehensive surveillance program. It is not intended to diagnose infection nor to guide or monitor treatment.     Cliffton Asters, MD [facility] [phone] pager   [phone] cell 10/23/2019, 10:48 AM

## 2019-10-23 NOTE — Anesthesia Preprocedure Evaluation (Addendum)
Anesthesia Evaluation  Patient identified by MRN, date of birth, ID band Patient awake    Reviewed: Allergy & Precautions, NPO status , Patient's Chart, lab work & pertinent test results  Airway Mallampati: II  TM Distance: >3 FB Neck ROM: Full    Dental no notable dental hx. (+) Teeth Intact, Dental Advisory Given   Pulmonary Current Smoker and Patient abstained from smoking.,    Pulmonary exam normal breath sounds clear to auscultation       Cardiovascular hypertension, Pt. on medications Normal cardiovascular exam Rhythm:Regular Rate:Normal     Neuro/Psych PSYCHIATRIC DISORDERS Anxiety Bipolar Disorder  Neuromuscular disease    GI/Hepatic   Endo/Other  diabetes, Type 2, Insulin Dependent  Renal/GU K+ 3.4 Cr 0.64     Musculoskeletal   Abdominal   Peds  Hematology  (+) anemia , Hgb 7.7 Plt 201   Anesthesia Other Findings   Reproductive/Obstetrics                            Anesthesia Physical Anesthesia Plan  ASA: III  Anesthesia Plan: Regional   Post-op Pain Management:    Induction:   PONV Risk Score and Plan: 1 and Treatment may vary due to age or medical condition and Ondansetron  Airway Management Planned: Nasal Cannula and Natural Airway  Additional Equipment: None  Intra-op Plan:   Post-operative Plan:   Informed Consent: I have reviewed the patients History and Physical, chart, labs and discussed the procedure including the risks, benefits and alternatives for the proposed anesthesia with the patient or authorized representative who has indicated his/her understanding and acceptance.     Dental advisory given  Plan Discussed with:   Anesthesia Plan Comments: (Mac w L Pop and Adductor canal)        Anesthesia Quick Evaluation

## 2019-10-23 NOTE — Op Note (Signed)
   Date of Surgery: 10/23/2019  INDICATIONS: Mr. Rogerson is a 59 y.o.-year-old male who has dehiscence of a transmetatarsal amputation that is 1 year out from surgery.  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  PROCEDURE: Transtibial amputation Application of Prevena wound VAC  SURGEON: [name], M.D.  ANESTHESIA:  general  IV FLUIDS AND URINE: See anesthesia.  ESTIMATED BLOOD LOSS: Minimal mL.  COMPLICATIONS: None.  DESCRIPTION OF PROCEDURE: The patient was brought to the operating room and underwent a general anesthetic. After adequate levels of anesthesia were obtained patient's lower extremity was prepped using DuraPrep draped into a sterile field. A timeout was called. The foot was draped out of the sterile field with impervious stockinette. A transverse incision was made 11 cm distal to the tibial tubercle. This curved proximally and a large posterior flap was created. The tibia was transected 1 cm proximal to the skin incision. The fibula was transected just proximal to the tibial incision. The tibia was beveled anteriorly. A large posterior flap was created. The sciatic nerve was pulled cut and allowed to retract. The vascular bundles were suture ligated with 2-0 silk. The deep and superficial fascial layers were closed using #1 Vicryl. The skin was closed using staples and 2-0 nylon. The wound was covered with a Prevena wound VAC. There was a good suction fit. A prosthetic shrinker was applied. Patient was extubated taken to the PACU in stable condition.   DISCHARGE PLANNING:  Antibiotic duration: Continue antibiotics 24 hours  Weightbearing: Nonweightbearing left    Pain medication: opoid pathway  Dressing care/ Wound QPY:[id number] at discharge  Discharge to: SNF  Follow-up: In  the office 1 week post operative.  Aldean Baker, MD [facility] 1:54 PM

## 2019-10-23 NOTE — Anesthesia Procedure Notes (Signed)
Anesthesia Regional Block: Popliteal block   Pre-Anesthetic Checklist: ,, timeout performed, Correct Patient, Correct Site, Correct Laterality, Correct Procedure, Correct Position, site marked, Risks and benefits discussed, pre-op evaluation,  At surgeon's request and post-op pain management  Laterality: Left  Prep: Maximum Sterile Barrier Precautions used, chloraprep       Needles:  Injection technique: Single-shot  Needle Type: Echogenic Needle     Needle Length: 9cm  Needle Gauge: 21     Additional Needles:   Procedures:,,,, ultrasound used (permanent image in chart),,,,  Narrative:  Start time: 10/23/2019 12:31 PM End time: 10/23/2019 12:37 PM Injection made incrementally with aspirations every 5 mL.  Performed by: Personally  Anesthesiologist: Trevor Iha, MD  Additional Notes: Block assessed. Patient tolerated procedure well.

## 2019-10-24 ENCOUNTER — Encounter: Payer: Self-pay | Admitting: *Deleted

## 2019-10-24 ENCOUNTER — Inpatient Hospital Stay: Payer: Self-pay

## 2019-10-24 LAB — GLUCOSE, CAPILLARY
Glucose-Capillary: 117 mg/dL — ABNORMAL HIGH (ref 70–99)
Glucose-Capillary: 125 mg/dL — ABNORMAL HIGH (ref 70–99)
Glucose-Capillary: 127 mg/dL — ABNORMAL HIGH (ref 70–99)
Glucose-Capillary: 147 mg/dL — ABNORMAL HIGH (ref 70–99)
Glucose-Capillary: 159 mg/dL — ABNORMAL HIGH (ref 70–99)
Glucose-Capillary: 176 mg/dL — ABNORMAL HIGH (ref 70–99)

## 2019-10-24 LAB — CBC WITH DIFFERENTIAL/PLATELET
Abs Immature Granulocytes: 0.05 10*3/uL (ref 0.00–0.07)
Basophils Absolute: 0 10*3/uL (ref 0.0–0.1)
Basophils Relative: 1 %
Eosinophils Absolute: 0.3 10*3/uL (ref 0.0–0.5)
Eosinophils Relative: 5 %
HCT: 24.5 % — ABNORMAL LOW (ref 39.0–52.0)
Hemoglobin: 7.8 g/dL — ABNORMAL LOW (ref 13.0–17.0)
Immature Granulocytes: 1 %
Lymphocytes Relative: 20 %
Lymphs Abs: 1.1 10*3/uL (ref 0.7–4.0)
MCH: 29.9 pg (ref 26.0–34.0)
MCHC: 31.8 g/dL (ref 30.0–36.0)
MCV: 93.9 fL (ref 80.0–100.0)
Monocytes Absolute: 0.4 10*3/uL (ref 0.1–1.0)
Monocytes Relative: 8 %
Neutro Abs: 3.7 10*3/uL (ref 1.7–7.7)
Neutrophils Relative %: 65 %
Platelets: 196 10*3/uL (ref 150–400)
RBC: 2.61 MIL/uL — ABNORMAL LOW (ref 4.22–5.81)
RDW: 14.8 % (ref 11.5–15.5)
WBC: 5.6 10*3/uL (ref 4.0–10.5)
nRBC: 0 % (ref 0.0–0.2)

## 2019-10-24 LAB — COMPREHENSIVE METABOLIC PANEL
ALT: 18 U/L (ref 0–44)
AST: 37 U/L (ref 15–41)
Albumin: 1.8 g/dL — ABNORMAL LOW (ref 3.5–5.0)
Alkaline Phosphatase: 178 U/L — ABNORMAL HIGH (ref 38–126)
Anion gap: 8 (ref 5–15)
BUN: <5 mg/dL — ABNORMAL LOW (ref 6–20)
CO2: 21 mmol/L — ABNORMAL LOW (ref 22–32)
Calcium: 7.6 mg/dL — ABNORMAL LOW (ref 8.9–10.3)
Chloride: 107 mmol/L (ref 98–111)
Creatinine, Ser: 0.63 mg/dL (ref 0.61–1.24)
GFR calc Af Amer: >60 mL/min (ref >60)
GFR calc non Af Amer: >60 mL/min (ref >60)
Glucose, Bld: 119 mg/dL — ABNORMAL HIGH (ref 70–99)
Potassium: 3.8 mmol/L (ref 3.5–5.1)
Sodium: 136 mmol/L (ref 135–145)
Total Bilirubin: 1.4 mg/dL — ABNORMAL HIGH (ref 0.3–1.2)
Total Protein: 6.4 g/dL — ABNORMAL LOW (ref 6.5–8.1)

## 2019-10-24 LAB — PHOSPHORUS: Phosphorus: 3.1 mg/dL (ref 2.5–4.6)

## 2019-10-24 LAB — MAGNESIUM: Magnesium: 1.6 mg/dL — ABNORMAL LOW (ref 1.7–2.4)

## 2019-10-24 MED ORDER — MAGNESIUM SULFATE 2 GM/50ML IV SOLN
2.0000 g | Freq: Once | INTRAVENOUS | Status: AC
  Administered 2019-10-24: 2 g via INTRAVENOUS
  Filled 2019-10-24: qty 50

## 2019-10-24 NOTE — Progress Notes (Signed)
PT Progress Note for Charges    10/24/19 1038  PT Visit Information  Last PT Received On 10/24/19  PT General Charges  $$ ACUTE PT VISIT 1 Visit  PT Evaluation  $PT Eval Moderate Complexity 1 Mod  PT Treatments  $Therapeutic Activity 8-22 mins  Arletta Bale, DPT  Acute Rehabilitation Services Pager [phone] Office [phone]

## 2019-10-24 NOTE — Evaluation (Signed)
Occupational Therapy Evaluation Patient Details Name: Cory Palmer MRN: [id number] DOB: Aug 05, 1960 Today's Date: 10/24/2019    History of Present Illness Cory Palmer 58 year old male s/p L BKA 10/23/2019. PMH significant for right BKA, DM, liver cirrhosis, Afib,  and bipolar disorder.   Clinical Impression   Pt with decline in function and safety with ADLs and ADL mobility with impaired balance, endurance and cognition. Pt lives at home alone in an apartment and was independent with ADLs/selfccare, IADLs, home mgt, and mobility with a cane. Pt has a R LE prosthesis. Pt currently requires min guard  with grooming/UB ADLs, mod A with LB ADLs. Pt declined transfers with OT (see PT note for transfer status). Pt with impaired understanding of safety and judgement related to functional mobility and compensatory ADL techniques. Pt would benefit from acute OT services to address impairments to maximize level of function and safety    Follow Up Recommendations  SNF    Equipment Recommendations  Other (comment)(TBD at next venue of care)    Recommendations for Other Services       Precautions / Restrictions Precautions Precautions: Fall Precaution Comments: Bil BKA Required Braces or Orthoses: Other Brace Other Brace: Limb protector & wound vac Restrictions Weight Bearing Restrictions: Yes Other Position/Activity Restrictions: NWB      Mobility Bed Mobility Overal bed mobility: Needs Assistance Bed Mobility: Supine to Sit     Supine to sit: Min guard     General bed mobility comments: able to long sit and sit EOB  Transfers Overall transfer level: Needs assistance              Lateral/Scoot Transfers: Min guard General transfer comment: declined, per PT note was able to lateral scoot min guard A    Balance Overall balance assessment: Mild deficits observed, not formally tested                                         ADL either performed or assessed  with clinical judgement   ADL Overall ADL's : Needs assistance/impaired Eating/Feeding: Set up;Independent;Sitting   Grooming: Wash/dry hands;Wash/dry face;Min guard;Sitting   Upper Body Bathing: Min guard;Sitting   Lower Body Bathing: Moderate assistance;Sitting/lateral leans   Upper Body Dressing : Min guard;Sitting   Lower Body Dressing: Moderate assistance;Sitting/lateral leans       Toileting- Clothing Manipulation and Hygiene: Moderate assistance;Sitting/lateral lean               Vision Baseline Vision/History: Wears glasses Wears Glasses: Reading only Patient Visual Report: No change from baseline       Perception     Praxis      Pertinent Vitals/Pain Pain Assessment: 0-10 Pain Score: 10-Worst pain ever Pain Location: L LE- wound Pain Descriptors / Indicators: Discomfort;Aching;Nagging Pain Intervention(s): Limited activity within patient's tolerance;Monitored during session;Repositioned     Hand Dominance Right   Extremity/Trunk Assessment Upper Extremity Assessment Upper Extremity Assessment: Overall WFL for tasks assessed RUE Deficits / Details: ROM: WNL, Strength: 5/5 for biceps, triceps, lats. 4/5 grip strength and serratus RUE Coordination: WNL LUE Deficits / Details: ROM: WNL, Strength: 5/5 for biceps, triceps, lats. 4/5 grip strength and serratus LUE Coordination: WNL   Lower Extremity Assessment Lower Extremity Assessment: Defer to PT evaluation RLE Deficits / Details: BKA; ROM WNL (hip flex, knee flex & ext) RLE Coordination: WNL LLE Deficits / Details: BKA; ROM WNL (hip flex,  knee flex & ext)       Communication Communication Communication: No difficulties   Cognition Arousal/Alertness: Awake/alert Behavior During Therapy: WFL for tasks assessed/performed Overall Cognitive Status: Impaired/Different from baseline Area of Impairment: Attention;Safety/judgement;Awareness;Problem solving                   Current Attention  Level: Selective     Safety/Judgement: Decreased awareness of safety;Decreased awareness of deficits Awareness: Emergent Problem Solving: Slow processing;Difficulty sequencing;Requires verbal cues;Requires tactile cues General Comments: Pt w/impaired understanding of safety and judgement related to functional mobility and compensatory ADL techniques   General Comments  Pt confused about wound vac and wrap requiring many explanations. Limb protector and sock doffed upon arrival    Exercises Exercises: General Lower Extremity General Exercises - Lower Extremity Quad Sets: AROM;Left;10 reps;Supine Hip Flexion/Marching: AROM;Both;5 reps;Supine   Shoulder Instructions      Home Living Family/patient expects to be discharged to:: Private residence Living Arrangements: Alone   Type of Home: Apartment Home Access: Level entry     Home Layout: One level     Bathroom Shower/Tub: Teacher, early years/pre: [place] - single point;Shower seat;Other (comment)(R LE prosthesis)          Prior Functioning/Environment Level of Independence: Independent with assistive device(s)        Comments: uses a cane for ambulation        OT Problem List: Impaired balance (sitting and/or standing);Decreased cognition;Pain;Decreased safety awareness;Decreased activity tolerance;Decreased coordination;Decreased knowledge of use of DME or AE      OT Treatment/Interventions: Self-care/ADL training;DME and/or AE instruction;Therapeutic activities;Balance training;Therapeutic exercise;Patient/family education    OT Goals(Current goals can be found in the care plan section) Acute Rehab OT Goals Patient Stated Goal: to get home OT Goal Formulation: With patient/family Time For Goal Achievement: 11/07/19 Potential to Achieve Goals: Good ADL Goals Pt Will Perform Grooming: with supervision;with set-up;sitting Pt Will Perform Upper Body Bathing: with  supervision;with set-up;sitting Pt Will Perform Lower Body Bathing: with min assist;sitting/lateral leans Pt Will Perform Upper Body Dressing: with supervision;with set-up;sitting Pt Will Perform Lower Body Dressing: with min assist;sitting/lateral leans Pt Will Transfer to Toilet: with min assist;with min guard assist;bedside commode;with transfer board(lateral scoot, AP transfer) Pt Will Perform Toileting - Clothing Manipulation and hygiene: with min assist;with min guard assist;sitting/lateral leans  OT Frequency: Min 2X/week   Barriers to D/C:            Co-evaluation              AM-PAC OT "6 Clicks" Daily Activity     Outcome Measure Help from another person eating meals?: None Help from another person taking care of personal grooming?: A Little Help from another person toileting, which includes using toliet, bedpan, or urinal?: A Lot Help from another person bathing (including washing, rinsing, drying)?: A Lot Help from another person to put on and taking off regular upper body clothing?: A Little Help from another person to put on and taking off regular lower body clothing?: A Lot 6 Click Score: 16   End of Session    Activity Tolerance: Patient limited by pain Patient left: in bed;with call bell/phone within reach;with family/visitor present  OT Visit Diagnosis: Other abnormalities of gait and mobility (R26.89);Pain;Other symptoms and signs involving cognitive function Pain - Right/Left: Left Pain - part of body: (residual limb)                Time:  4142-3953 OT Time Calculation (min): 21 min Charges:  OT General Charges $OT Visit: 1 Visit OT Evaluation $OT Eval Moderate Complexity: 1 Mod    Galen Manila 10/24/2019, 11:32 AM

## 2019-10-24 NOTE — Progress Notes (Signed)
Patient ID: Cory Palmer, male   DOB: 11/16/1960, 59 y.o.   MRN: [id number]          [facility]    Date of Admission:  10/22/2019   Total days of antibiotics 6         [name] underwent left BKA yesterday curing his diabetic foot infection.  I will discontinue ampicillin sulbactam and continue vancomycin alone for his MRSA bacteremia.  We will leave final recommendations on optimal duration of therapy once TEE has been performed.  He is scheduled to have a PICC placed.         Cliffton Asters, MD [facility] [phone] pager   [phone] cell 10/24/2019, 12:36 PM

## 2019-10-24 NOTE — H&P (View-Only) (Signed)
PROGRESS NOTE    Cory Palmer  KDT:[id number] DOB: 12-18-1960 DOA: 10/22/2019 PCP: Patient, [name] Per   Brief Narrative: Cory Palmer is Cory Palmer 59 y.o. male with medical history significant for bipolar disorder, liver cirrhosis, type 2 diabetes mellitus, recent admission with osteomyelitis, MRSA bacteremia with left foot with suspected source, and was planned for surgery left AMA on 10/21/2019 now returns requesting resumption of his care.  Patient reports that he was able to attend to some business that had required to leave AMA, is now dedicated to treatment of his infection, and states that his brother is planning to come from out of town to assist him in his recovery.  Patient denies any fevers or chills, has not been taking any medications since leaving the hospital, and denies any chest pain, abdominal pain, cough, or shortness of breath.  He continues to have some bloody and purulent drainage from the left foot.  ED Course: Upon arrival to the ED, patient is found to be afebrile, saturating, stable blood pressure.  Chemistry panel with sodium 132 her bicarbonate is 19.  CBC with leukocytosis to 10,900 and stable normocytic anemia with hemoglobin 8.5.  Covid PCR screening test has been ordered hospitalists consulted for admission.  Assessment & Plan:   Principal Problem:   MRSA bacteremia Active Problems:   Diabetes mellitus type II, non insulin dependent (HCC)   S/P transmetatarsal amputation of foot, left (HCC)   Depressed bipolar disorder (HCC)   PAF (paroxysmal atrial fibrillation) (HCC)   Osteomyelitis of left foot (HCC)   Hyponatremia  1. MRSA bacteremia; left foot osteomyelitis  - Patient was admitted with worsening foot wound on 3/20 - plain films concerning for osteomyelitis - blood culture from 3/20 growing MRSA - repeat blood cx 3/22 NGTD  - echo 3/22 -> EF 60-65% - evidence of atrial level shunting.  Will request TEE by cardiology. - Continue vancomycin.  Unasyn now  d/c'd after L BKA. - ID c/s, appreciate recs - recommending PICC on 3/25, TEE (plan for Friday per cardiology), continue abx - Ortho c/s, s/p transtibial amputation on 3/24  2. Paroxysmal atrial fibrillation  - Not anticoagulated prior to admission, CHADS-VASc may only be 1 (DM), but also poor candidate due to chronic anemia and poor adherence   3. Type II DM  - A1c was 6,8% earlier this month  - Check CBGs and use Cory Palmer low-intensity SSI for now    4. Hyponatremia  - Serum sodium is 132 in ED, similar to priors, appears euvolemic, likely related to cirrhosis, will monitor    # Hypomagnesemia: replace and follow  5. Bipolar disorder  - Continue Seroquel, Zoloft, trazodone    6. Anemia  Iron Def Anemia - Trend H/H - stable - iron, b12, folate, ferritin - suggestive of iron def anemia - Patient denies melena or hematochezia; likely related to cirrhosis, chronic infection   7. Cirrhosis  - Appears compensated   DVT prophylaxis: SCD Code Status: full Family Communication: brother at bedside Disposition Plan:  . Patient came from: home            . Anticipated d/c place:home . Barriers to d/c OR conditions which need to be met to effect Cory Palmer safe d/c: pending TEE, clearance by ortho   Consultants:   orthopedics  Procedures:  3/24 - s/p L transtibial amputation  Antimicrobials:  Anti-infectives (From admission, onward)   Start     Dose/Rate Route Frequency Ordered Stop   10/23/19 1400  vancomycin (  VANCOREADY) IVPB 1250 mg/250 mL     1,250 mg 166.7 mL/hr over 90 Minutes Intravenous Every 12 hours 10/23/19 0129     10/23/19 1030  ceFAZolin (ANCEF) IVPB 2g/100 mL premix  Status:  Discontinued     2 g 200 mL/hr over 30 Minutes Intravenous On call to O.R. 10/23/19 1026 10/23/19 1412   10/23/19 1000  Ampicillin-Sulbactam (UNASYN) 3 g in sodium chloride 0.9 % 100 mL IVPB  Status:  Discontinued     3 g 200 mL/hr over 30 Minutes Intravenous Every 6 hours 10/23/19 0858 10/24/19  1239   10/23/19 0130  vancomycin (VANCOREADY) IVPB 2000 mg/400 mL     2,000 mg 200 mL/hr over 120 Minutes Intravenous  Once 10/23/19 0128 10/23/19 0630     Subjective: No new compalints Getting used to amputation  Objective: Vitals:   10/24/19 0300 10/24/19 0358 10/24/19 0824 10/24/19 1358  BP: 127/67 127/67 121/61 120/89  Pulse: 60 (!) 59 69 71  Resp: _0 Temp: 98 F (36.7 C) 98 F (36.7 C) 98 F (36.7 C) 98 F (36.7 C)  TempSrc: Oral Oral Oral Oral  SpO2: 94% 94% 96% 97%  Weight:      Height:        Intake/Output Summary (Last 24 hours) at 10/24/2019 1447 Last data filed at 10/24/2019 1300 Gross per 24 hour  Intake 2506.68 ml  Output 2450 ml  Net 56.68 ml   Filed Weights   10/22/19 2329  Weight: 113.9 kg    Examination:  General: No acute distress. Cardiovascular: Heart sounds show Cory Palmer regular rate, and rhythm. Lungs: Clear to auscultation bilaterall Abdomen: Soft, nontender, nondistended  Neurological: Alert and oriented 3. Moves all extremities 4. Cranial nerves II through XII grossly intact Extremities: LLE s/p BKA with wound vac  Data Reviewed: I have personally reviewed following labs and imaging studies  CBC: Recent Labs  Lab 10/19/19 1451 10/19/19 1451 10/20/19 0504 10/20/19 0504 10/21/19 0228 10/22/19 1924 10/23/19 0532 10/23/19 1638 10/24/19 0440  WBC 13.8*   < > 10.2  --  8.4 10.9* 7.6  --  5.6  NEUTROABS 11.4*  --   --   --   --   --  5.1  --  3.7  HGB 9.4*   < > 8.9*   < > 8.2* 8.5* 7.7* 7.8* 7.8*  HCT 29.0*   < > 27.3*   < > 25.2* 25.9* 23.1* 24.7* 24.5*  MCV 94.2   < > 93.8  --  93.0 93.2 93.1  --  93.9  PLT 278   < > 220  --  207 243 201  --  196   < > = values in this interval not displayed.   Basic Metabolic Panel: Recent Labs  Lab 10/19/19 1451 10/19/19 1716 10/20/19 0504 10/21/19 0228 10/22/19 1924 10/23/19 0532 10/24/19 0440  NA   < >  --  132* 130* 132* 138 136  K   < >  --  3.4* 3.6 3.7 3.4* 3.8  CL   < >   --  104 103 102 108 107  CO2   < >  --  19* 19* 19* 22 21*  GLUCOSE   < >  --  144* 180* 155* 115* 119*  BUN   < >  --  _1 <5*  CREATININE   < >  --  0.58* 0.66 0.67 0.64 0.63  CALCIUM   < >  --  7.6* 7.6* 8.2* 8.0* 7.6*  MG  --  1.6* 1.8 1.6*  --  1.7 1.6*  PHOS  --   --   --   --   --   --  3.1   < > = values in this interval not displayed.   GFR: Estimated Creatinine Clearance: 137.3 mL/min (by C-G formula based on SCr of 0.63 mg/dL). Liver Function Tests: Recent Labs  Lab 10/19/19 1451 10/20/19 0504 10/23/19 0532 10/24/19 0440  AST 33 24 31 37  ALT _0 ALKPHOS 201* 170* 184* 178*  BILITOT 2.3* 2.1* 1.3* 1.4*  PROT 7.5 6.3* 6.2* 6.4*  ALBUMIN 2.6* 2.1* 1.9* 1.8*   Recent Labs  Lab 10/19/19 1657  LIPASE 25   No results for input(s): AMMONIA in the last 168 hours. Coagulation Profile: Recent Labs  Lab 10/23/19 0532  INR 1.4*   Cardiac Enzymes: No results for input(s): CKTOTAL, CKMB, CKMBINDEX, TROPONINI in the last 168 hours. BNP (last 3 results) No results for input(s): PROBNP in the last 8760 hours. HbA1C: No results for input(s): HGBA1C in the last 72 hours. CBG: Recent Labs  Lab 10/23/19 1946 10/24/19 0001 10/24/19 0400 10/24/19 0827 10/24/19 1128  GLUCAP 165* 117* 125* 159* 147*   Lipid Profile: No results for input(s): CHOL, HDL, LDLCALC, TRIG, CHOLHDL, LDLDIRECT in the last 72 hours. Thyroid Function Tests: No results for input(s): TSH, T4TOTAL, FREET4, T3FREE, THYROIDAB in the last 72 hours. Anemia Panel: Recent Labs    10/23/19 1638  VITAMINB12 950*  FOLATE 23.4  FERRITIN 140  TIBC 179*  IRON 22*   Sepsis Labs: Recent Labs  Lab 10/19/19 1451 10/19/19 1804  LATICACIDVEN 4.5* 2.5*    Recent Results (from the past 240 hour(s))  Blood culture (routine x 2)     Status: Abnormal   Collection Time: 10/19/19  2:51 PM   Specimen: BLOOD RIGHT FOREARM  Result Value Ref Range Status   Specimen Description   Final    BLOOD  RIGHT FOREARM Performed at [facility], [place] [address] [place], [place] [address]    Special Requests   Final    BOTTLES DRAWN AEROBIC AND ANAEROBIC Blood Culture adequate volume Performed at [place] [address], [place], [place] [address]    Culture  Setup Time   Final    GRAM POSITIVE COCCI IN CLUSTERS IN BOTH AEROBIC AND ANAEROBIC BOTTLES CRITICAL RESULT CALLED TO, READ BACK BY AND VERIFIED WITH: Dalton [id number] AT 0174 BY CM Performed at [facility], [place] [address], [place], [place] [address]    Culture METHICILLIN RESISTANT STAPHYLOCOCCUS AUREUS (Haillie Radu)  Final   Report Status 10/22/2019 FINAL  Final   Organism ID, Bacteria METHICILLIN RESISTANT STAPHYLOCOCCUS AUREUS  Final      Susceptibility   Methicillin resistant staphylococcus aureus - MIC*    CIPROFLOXACIN 4 RESISTANT Resistant     ERYTHROMYCIN >=8 RESISTANT Resistant     GENTAMICIN <=0.5 SENSITIVE Sensitive     OXACILLIN >=4 RESISTANT Resistant     TETRACYCLINE >=16 RESISTANT Resistant     VANCOMYCIN <=0.5 SENSITIVE Sensitive     TRIMETH/SULFA <=10 SENSITIVE Sensitive     CLINDAMYCIN <=0.25 SENSITIVE Sensitive     RIFAMPIN <=0.5 SENSITIVE Sensitive     Inducible Clindamycin NEGATIVE Sensitive     * METHICILLIN RESISTANT STAPHYLOCOCCUS AUREUS  Respiratory Panel by RT PCR (Flu Kayan Blissett&B, Covid) - Nasopharyngeal Swab     Status: None  Collection Time: 10/19/19  8:07 PM   Specimen: Nasopharyngeal Swab  Result Value Ref Range Status   SARS Coronavirus 2 by RT PCR NEGATIVE NEGATIVE Final    Comment: (NOTE) SARS-CoV-2 target nucleic acids are NOT DETECTED. The SARS-CoV-2 RNA is generally detectable in upper respiratoy specimens during the acute phase of infection. The lowest concentration of SARS-CoV-2 viral copies this assay can detect is 131 copies/mL. Jermar Colter negative result does not preclude SARS-Cov-2 infection and should not be used as the sole basis for treatment or other  patient management decisions. Viva Gallaher negative result may occur with  improper specimen collection/handling, submission of specimen other than nasopharyngeal swab, presence of viral mutation(s) within the areas targeted by this assay, and inadequate number of viral copies (<131 copies/mL). Javad Salva negative result must be combined with clinical observations, patient history, and epidemiological information. The expected result is Negative. Fact Sheet for Patients:  PinkCheek.be Fact Sheet for Healthcare Providers:  GravelBags.it This test is not yet ap proved or cleared by the [place] FDA and  has been authorized for detection and/or diagnosis of SARS-CoV-2 by FDA under an Emergency Use Authorization (EUA). This EUA will remain  in effect (meaning this test can be used) for the duration of the COVID-19 declaration under Section 564(b)(1) of the Act, 21 U.S.C. section 360bbb-3(b)(1), unless the authorization is terminated or revoked sooner.    Influenza Dekota Kirlin by PCR NEGATIVE NEGATIVE Final   Influenza B by PCR NEGATIVE NEGATIVE Final    Comment: (NOTE) The Xpert Xpress SARS-CoV-2/FLU/RSV assay is intended as an aid in  the diagnosis of influenza from Nasopharyngeal swab specimens and  should not be used as Tyrion Glaude sole basis for treatment. Nasal washings and  aspirates are unacceptable for Xpert Xpress SARS-CoV-2/FLU/RSV  testing. Fact Sheet for Patients: PinkCheek.be Fact Sheet for Healthcare Providers: GravelBags.it This test is not yet approved or cleared by the [place] FDA and  has been authorized for detection and/or diagnosis of SARS-CoV-2 by  FDA under an Emergency Use Authorization (EUA). This EUA will remain  in effect (meaning this test can be used) for the duration of the  Covid-19 declaration under Section 564(b)(1) of the Act, 21  U.S.C. section 360bbb-3(b)(1), unless  the authorization is  terminated or revoked. Performed at [facility], [place] [address], [place], [place] [address]   Blood culture (routine x 2)     Status: None (Preliminary result)   Collection Time: 10/20/19  5:04 AM   Specimen: BLOOD  Result Value Ref Range Status   Specimen Description   Final    BLOOD LEFT ANTECUBITAL Performed at [place] [address] [place], [place] [address]    Special Requests   Final    BOTTLES DRAWN AEROBIC ONLY Blood Culture adequate volume Performed at [place] [address], [place], [place] [address]    Culture   Final    NO GROWTH 4 DAYS Performed at [facility], [place] [address] [place], [place] [address]    Report Status PENDING  Incomplete  Culture, blood (Routine X 2) w Reflex to ID Panel     Status: None (Preliminary result)   Collection Time: 10/21/19  4:35 PM   Specimen: BLOOD LEFT HAND  Result Value Ref Range Status   Specimen Description BLOOD LEFT HAND  Final   Special Requests   Final    BOTTLES DRAWN AEROBIC ONLY Blood Culture adequate volume   Culture   Final  NO GROWTH 3 DAYS Performed at [facility], [place] [address] [place], [place] [address]    Report Status PENDING  Incomplete  Culture, blood (Routine X 2) w Reflex to ID Panel     Status: None (Preliminary result)   Collection Time: 10/21/19  4:35 PM   Specimen: BLOOD LEFT HAND  Result Value Ref Range Status   Specimen Description BLOOD LEFT HAND  Final   Special Requests   Final    BOTTLES DRAWN AEROBIC AND ANAEROBIC Blood Culture adequate volume   Culture   Final    NO GROWTH 3 DAYS Performed at [facility], [place] [address] [place], [place] [address]    Report Status PENDING  Incomplete  SARS CORONAVIRUS 2 (TAT 6-24 HRS) Nasopharyngeal Nasopharyngeal Swab     Status: None   Collection Time: 10/23/19  1:30 AM   Specimen: Nasopharyngeal Swab  Result Value Ref Range Status    SARS Coronavirus 2 NEGATIVE NEGATIVE Final    Comment: (NOTE) SARS-CoV-2 target nucleic acids are NOT DETECTED. The SARS-CoV-2 RNA is generally detectable in upper and lower respiratory specimens during the acute phase of infection. Negative results do not preclude SARS-CoV-2 infection, do not rule out co-infections with other pathogens, and should not be used as the sole basis for treatment or other patient management decisions. Negative results must be combined with clinical observations, patient history, and epidemiological information. The expected result is Negative. Fact Sheet for Patients: SugarRoll.be Fact Sheet for Healthcare Providers: https://www.woods-mathews.com/ This test is not yet approved or cleared by the [place] FDA and  has been authorized for detection and/or diagnosis of SARS-CoV-2 by FDA under an Emergency Use Authorization (EUA). This EUA will remain  in effect (meaning this test can be used) for the duration of the COVID-19 declaration under Section 56 4(b)(1) of the Act, 21 U.S.C. section 360bbb-3(b)(1), unless the authorization is terminated or revoked sooner. Performed at [facility], [place] [address] [place], [place] [address]   Surgical pcr screen     Status: Abnormal   Collection Time: 10/23/19  3:27 AM   Specimen: Nasal Mucosa; Nasal Swab  Result Value Ref Range Status   MRSA, PCR POSITIVE (Moises Terpstra) NEGATIVE Final    Comment: RESULT CALLED TO, READ BACK BY AND VERIFIED WITH: COLLIE,L RN [phone] 10/23/2019 MITCHELL,L    Staphylococcus aureus POSITIVE (Nyaja Dubuque) NEGATIVE Final    Comment: (NOTE) The Xpert SA Assay (FDA approved for NASAL specimens in patients 47 years of age and older), is one component of Carleen Rhue comprehensive surveillance program. It is not intended to diagnose infection nor to guide or monitor treatment.          Radiology Studies: [place] EKG SITE RITE  Result Date: 10/24/2019 If Site Rite  image not attached, placement could not be confirmed due to current cardiac rhythm.       Scheduled Meds: . Chlorhexidine Gluconate Cloth  6 each Topical Q0600  . docusate sodium  100 mg Oral BID  . insulin aspart  0-9 Units Subcutaneous Q4H  . mupirocin ointment  1 application Nasal BID  . QUEtiapine  300 mg Oral QHS  . sertraline  50 mg Oral Daily  . traZODone  150 mg Oral QHS   Continuous Infusions: . sodium chloride Stopped (10/24/19 0159)  . lactated ringers 500 mL/hr at 10/23/19 1329  . vancomycin Stopped (10/24/19 0201)     LOS: 1 day    Time spent: over 30 min  Fayrene Helper, MD [facility]   To contact the attending provider between 7A-7P or the covering provider during after hours 7P-7A, please log into the web site [email or web address] and access using universal [place] password for that web site. If you do not have the password, please call the hospital operator.  10/24/2019, 2:47 PM

## 2019-10-24 NOTE — Evaluation (Signed)
Physical Therapy Evaluation Patient Details Name: Cory Palmer MRN: [id number] DOB: [birth year] Today's Date: 10/24/2019   History of Present Illness  Abdulah Iqbal 59 year old male s/p L BKA 10/23/2019. PMH significant for right BKA, DM, liver cirrhosis, Afib,  and bipolar disorder.  Clinical Impression  Patient presented sitting in bed, awake, and willing to participate in therapy. PTA, pt was independent in all ADL's with use of cane and R prosthesis. Pt lives alone in a level entry apartement. At the time of evaluation, pt presented with mild cognitive impairments with safety, judgement, and functional mobility limitations, stating he "just needed an electric wheelchair then he could go home". He demonstrated mild short term memory deficits, frequently asking about his wound dressing. He was able to scoot posteriorly while long-sitting in bed requiring increased time and effort and min guard for balance. Session limited by pain, fatigue, and cognitive deficits. Hopeful to progress with transfers OOB once pain is controlled. Recommend CIR to address deficits and maximize function, safety, and independence.   Pt would continue to benefit from skilled physical therapy services at this time while admitted and after d/c to address the below listed limitations in order to improve overall safety and independence with functional mobility.    Follow Up Recommendations CIR    Equipment Recommendations  Other (comment)(Defer to next venue of care)    Recommendations for Other Services       Precautions / Restrictions Precautions Precautions: Fall Precaution Comments: Bil BKA Required Braces or Orthoses: Other Brace Other Brace: Limb protector & wound vac Restrictions Weight Bearing Restrictions: Yes Other Position/Activity Restrictions: NWB      Mobility  Bed Mobility Overal bed mobility: Needs Assistance Bed Mobility: Supine to Sit     Supine to sit: Min assist     General bed  mobility comments: Min A for balance in long sitting  Transfers                Lateral/Scoot Transfers: Min guard General transfer comment: min guard for balance and safety. Pt required inc time and effort to scoot posteriorly, limited by 10/10 pain  Ambulation/Gait                Stairs            Wheelchair Mobility    Modified Rankin (Stroke Patients Only)       Balance Overall balance assessment: Mild deficits observed, not formally tested                                           Pertinent Vitals/Pain Pain Assessment: 0-10 Pain Score: 10-Worst pain ever Pain Location: L LE- wound Pain Descriptors / Indicators: Discomfort;Aching;Nagging Pain Intervention(s): Limited activity within patient's tolerance;Monitored during session    Home Living Family/patient expects to be discharged to:: Private residence Living Arrangements: Alone   Type of Home: Apartment Home Access: Level entry     Home Layout: One level Home Equipment: Cane - single point;Shower seat;Other (comment)(R prosthesis)      Prior Function Level of Independence: Independent with assistive device(s)         Comments: uses a cane for ambulation     Hand Dominance        Extremity/Trunk Assessment   Upper Extremity Assessment Upper Extremity Assessment: RUE deficits/detail;LUE deficits/detail RUE Deficits / Details: ROM: WNL, Strength: 5/5 for biceps, triceps, lats. 4/5 grip  strength and serratus RUE Coordination: WNL LUE Deficits / Details: ROM: WNL, Strength: 5/5 for biceps, triceps, lats. 4/5 grip strength and serratus LUE Coordination: WNL    Lower Extremity Assessment Lower Extremity Assessment: RLE deficits/detail;LLE deficits/detail RLE Deficits / Details: BKA; ROM WNL (hip flex, knee flex & ext) RLE Coordination: WNL LLE Deficits / Details: BKA; ROM WNL (hip flex, knee flex & ext)       Communication   Communication: No difficulties   Cognition Arousal/Alertness: Awake/alert Behavior During Therapy: WFL for tasks assessed/performed Overall Cognitive Status: Impaired/Different from baseline Area of Impairment: Attention;Safety/judgement;Awareness;Problem solving                   Current Attention Level: Selective     Safety/Judgement: Decreased awareness of safety;Decreased awareness of deficits Awareness: Emergent Problem Solving: Slow processing;Difficulty sequencing;Requires verbal cues;Requires tactile cues General Comments: Pt w/impaired understanding of safety and judgement in regards to functional mobility and healing of L LE      General Comments General comments (skin integrity, edema, etc.): Pt confused about wound vac and wrap requiring many explanations. Limb protector and sock doffed upon arrival    Exercises General Exercises - Lower Extremity Quad Sets: AROM;Left;10 reps;Supine Hip Flexion/Marching: AROM;Both;5 reps;Supine   Assessment/Plan    PT Assessment Patient needs continued PT services  PT Problem List Decreased strength;Decreased activity tolerance;Decreased balance;Decreased mobility;Decreased coordination;Decreased cognition;Decreased knowledge of use of DME;Decreased safety awareness;Decreased knowledge of precautions       PT Treatment Interventions DME instruction;Functional mobility training;Therapeutic activities;Therapeutic exercise;Balance training;Cognitive remediation    PT Goals (Current goals can be found in the Care Plan section)  Acute Rehab PT Goals Patient Stated Goal: to get home PT Goal Formulation: With patient Time For Goal Achievement: 11/07/19 Potential to Achieve Goals: Fair    Frequency Min 5X/week   Barriers to discharge Decreased caregiver support      Co-evaluation               AM-PAC PT "6 Clicks" Mobility  Outcome Measure Help needed turning from your back to your side while in a flat bed without using bedrails?: A Little Help  needed moving from lying on your back to sitting on the side of a flat bed without using bedrails?: A Little Help needed moving to and from a bed to a chair (including a wheelchair)?: A Lot Help needed standing up from a chair using your arms (e.g., wheelchair or bedside chair)?: Total Help needed to walk in hospital room?: Total Help needed climbing 3-5 steps with a railing? : Total 6 Click Score: 11    End of Session   Activity Tolerance: Patient tolerated treatment well;Patient limited by pain Patient left: in bed;with call bell/phone within reach;with bed alarm set   PT Visit Diagnosis: Other abnormalities of gait and mobility (R26.89);Muscle weakness (generalized) (M62.81)    Time: 2951-8841 PT Time Calculation (min) (ACUTE ONLY): 25 min   Charges:   PT Evaluation $PT Eval Moderate Complexity: (P) 1 Mod PT Treatments $Therapeutic Activity: (P) 8-22 mins        Racer Quam, SPT Acute Rehab  [phone]   Treyvion Durkee 10/24/2019, 10:38 AM

## 2019-10-24 NOTE — Progress Notes (Signed)
PROGRESS NOTE    Cory Palmer  MRN:[id number] DOB: 09/24/1960 DOA: 10/22/2019 PCP: Cory Palmer   Brief Narrative: Cory Palmer is Cory Palmer 58 y.o. male with medical history significant for bipolar disorder, liver cirrhosis, type 2 diabetes mellitus, recent admission with osteomyelitis, MRSA bacteremia with left foot with suspected source, and was planned for surgery left AMA on 10/21/2019 now returns requesting resumption of his care.  Patient reports that he was able to attend to some business that had required to leave AMA, is now dedicated to treatment of his infection, and states that his brother is planning to come from out of town to assist him in his recovery.  Patient denies any fevers or chills, has not been taking any medications since leaving the hospital, and denies any chest pain, abdominal pain, cough, or shortness of breath.  He continues to have some bloody and purulent drainage from the left foot.  ED Course: Upon arrival to the ED, patient is found to be afebrile, saturating, stable blood pressure.  Chemistry panel with sodium 132 her bicarbonate is 19.  CBC with leukocytosis to 10,900 and stable normocytic anemia with hemoglobin 8.5.  Covid PCR screening test has been ordered hospitalists consulted for admission.  Assessment & Plan:   Principal Problem:   MRSA bacteremia Active Problems:   Diabetes mellitus type II, non insulin dependent (HCC)   S/P transmetatarsal amputation of foot, left (HCC)   Depressed bipolar disorder (HCC)   PAF (paroxysmal atrial fibrillation) (HCC)   Osteomyelitis of left foot (HCC)   Hyponatremia  1. MRSA bacteremia; left foot osteomyelitis  - Patient was admitted with worsening foot wound on 3/20 - plain films concerning for osteomyelitis - blood culture from 3/20 growing MRSA - repeat blood cx 3/22 NGTD  - echo 3/22 -> EF 60-65% - evidence of atrial level shunting.  Will request TEE by cardiology. - Continue vancomycin.  Unasyn now  d/c'd after L BKA. - ID c/s, appreciate recs - recommending PICC on 3/25, TEE (plan for Friday Palmer cardiology), continue abx - Ortho c/s, s/p transtibial amputation on 3/24  2. Paroxysmal atrial fibrillation  - Not anticoagulated prior to admission, CHADS-VASc may only be 1 (DM), but also poor candidate due to chronic anemia and poor adherence   3. Type II DM  - A1c was 6,8% earlier this month  - Check CBGs and use Cory Palmer low-intensity SSI for now    4. Hyponatremia  - Serum sodium is 132 in ED, similar to priors, appears euvolemic, likely related to cirrhosis, will monitor    # Hypomagnesemia: replace and follow  5. Bipolar disorder  - Continue Seroquel, Zoloft, trazodone    6. Anemia  Iron Def Anemia - Trend H/H - stable - iron, b12, folate, ferritin - suggestive of iron def anemia - Patient denies melena or hematochezia; likely related to cirrhosis, chronic infection   7. Cirrhosis  - Appears compensated   DVT prophylaxis: SCD Code Status: full Family Communication: brother at bedside Disposition Plan:  . Patient came from: home            . Anticipated d/c place:home . Barriers to d/c OR conditions which need to be met to effect Cory Palmer safe d/c: pending TEE, clearance by ortho   Consultants:   orthopedics  Procedures:  3/24 - s/p L transtibial amputation  Antimicrobials:  Anti-infectives (From admission, onward)   Start     Dose/Rate Route Frequency Ordered Stop   10/23/19 1400  vancomycin (  VANCOREADY) IVPB 1250 mg/250 mL     1,250 mg 166.7 mL/hr over 90 Minutes Intravenous Every 12 hours 10/23/19 0129     10/23/19 1030  ceFAZolin (ANCEF) IVPB 2g/100 mL premix  Status:  Discontinued     2 g 200 mL/hr over 30 Minutes Intravenous On call to O.R. 10/23/19 1026 10/23/19 1412   10/23/19 1000  Ampicillin-Sulbactam (UNASYN) 3 g in sodium chloride 0.9 % 100 mL IVPB  Status:  Discontinued     3 g 200 mL/hr over 30 Minutes Intravenous Every 6 hours 10/23/19 0858 10/24/19  1239   10/23/19 0130  vancomycin (VANCOREADY) IVPB 2000 mg/400 mL     2,000 mg 200 mL/hr over 120 Minutes Intravenous  Once 10/23/19 0128 10/23/19 0630     Subjective: No new compalints Getting used to amputation  Objective: Vitals:   10/24/19 0300 10/24/19 0358 10/24/19 0824 10/24/19 1358  BP: 127/67 127/67 121/61 120/89  Pulse: 60 (!) 59 69 71  Resp: _0 Temp: 98 F (36.7 C) 98 F (36.7 C) 98 F (36.7 C) 98 F (36.7 C)  TempSrc: Oral Oral Oral Oral  SpO2: 94% 94% 96% 97%  Weight:      Height:        Intake/Output Summary (Last 24 hours) at 10/24/2019 1447 Last data filed at 10/24/2019 1300 Gross Palmer 24 hour  Intake 2506.68 ml  Output 2450 ml  Net 56.68 ml   Filed Weights   10/22/19 2329  Weight: 113.9 kg    Examination:  General: No acute distress. Cardiovascular: Heart sounds show Cory Palmer regular rate, and rhythm. Lungs: Clear to auscultation bilaterall Abdomen: Soft, nontender, nondistended  Neurological: Alert and oriented 3. Moves all extremities 4. Cranial nerves II through XII grossly intact Extremities: LLE s/p BKA with wound vac  Data Reviewed: I have personally reviewed following labs and imaging studies  CBC: Recent Labs  Lab 10/19/19 1451 10/19/19 1451 10/20/19 0504 10/20/19 0504 10/21/19 0228 10/22/19 1924 10/23/19 0532 10/23/19 1638 10/24/19 0440  WBC 13.8*   < > 10.2  --  8.4 10.9* 7.6  --  5.6  NEUTROABS 11.4*  --   --   --   --   --  5.1  --  3.7  HGB 9.4*   < > 8.9*   < > 8.2* 8.5* 7.7* 7.8* 7.8*  HCT 29.0*   < > 27.3*   < > 25.2* 25.9* 23.1* 24.7* 24.5*  MCV 94.2   < > 93.8  --  93.0 93.2 93.1  --  93.9  PLT 278   < > 220  --  207 243 201  --  196   < > = values in this interval not displayed.   Basic Metabolic Panel: Recent Labs  Lab 10/19/19 1451 10/19/19 1716 10/20/19 0504 10/21/19 0228 10/22/19 1924 10/23/19 0532 10/24/19 0440  NA   < >  --  132* 130* 132* 138 136  K   < >  --  3.4* 3.6 3.7 3.4* 3.8  CL   < >   --  104 103 102 108 107  CO2   < >  --  19* 19* 19* 22 21*  GLUCOSE   < >  --  144* 180* 155* 115* 119*  BUN   < >  --  _1 <5*  CREATININE   < >  --  0.58* 0.66 0.67 0.64 0.63  CALCIUM   < >  --  7.6* 7.6* 8.2* 8.0* 7.6*  MG  --  1.6* 1.8 1.6*  --  1.7 1.6*  PHOS  --   --   --   --   --   --  3.1   < > = values in this interval not displayed.   GFR: Estimated Creatinine Clearance: 137.3 mL/min (by C-G formula based on SCr of 0.63 mg/dL). Liver Function Tests: Recent Labs  Lab 10/19/19 1451 10/20/19 0504 10/23/19 0532 10/24/19 0440  AST 33 24 31 37  ALT _0 ALKPHOS 201* 170* 184* 178*  BILITOT 2.3* 2.1* 1.3* 1.4*  PROT 7.5 6.3* 6.2* 6.4*  ALBUMIN 2.6* 2.1* 1.9* 1.8*   Recent Labs  Lab 10/19/19 1657  LIPASE 25   No results for input(s): AMMONIA in the last 168 hours. Coagulation Profile: Recent Labs  Lab 10/23/19 0532  INR 1.4*   Cardiac Enzymes: No results for input(s): CKTOTAL, CKMB, CKMBINDEX, TROPONINI in the last 168 hours. BNP (last 3 results) No results for input(s): PROBNP in the last 8760 hours. HbA1C: No results for input(s): HGBA1C in the last 72 hours. CBG: Recent Labs  Lab 10/23/19 1946 10/24/19 0001 10/24/19 0400 10/24/19 0827 10/24/19 1128  GLUCAP 165* 117* 125* 159* 147*   Lipid Profile: No results for input(s): CHOL, HDL, LDLCALC, TRIG, CHOLHDL, LDLDIRECT in the last 72 hours. Thyroid Function Tests: No results for input(s): TSH, T4TOTAL, FREET4, T3FREE, THYROIDAB in the last 72 hours. Anemia Panel: Recent Labs    10/23/19 1638  VITAMINB12 950*  FOLATE 23.4  FERRITIN 140  TIBC 179*  IRON 22*   Sepsis Labs: Recent Labs  Lab 10/19/19 1451 10/19/19 1804  LATICACIDVEN 4.5* 2.5*    Recent Results (from the past 240 hour(s))  Blood culture (routine x 2)     Status: Abnormal   Collection Time: 10/19/19  2:51 PM   Specimen: BLOOD RIGHT FOREARM  Result Value Ref Range Status   Specimen Description   Final    BLOOD  RIGHT FOREARM Performed at [facility], [place] [address] [place], [place] [address]    Special Requests   Final    BOTTLES DRAWN AEROBIC AND ANAEROBIC Blood Culture adequate volume Performed at [place] [address], [place], [place] [address]    Culture  Setup Time   Final    GRAM POSITIVE COCCI IN CLUSTERS IN BOTH AEROBIC AND ANAEROBIC BOTTLES CRITICAL RESULT CALLED TO, READ BACK BY AND VERIFIED WITH: Dalton [id number] AT 0174 BY CM Performed at [facility], [place] [address], [place], [place] [address]    Culture METHICILLIN RESISTANT STAPHYLOCOCCUS AUREUS (Beonca Gibb)  Final   Report Status 10/22/2019 FINAL  Final   Organism ID, Bacteria METHICILLIN RESISTANT STAPHYLOCOCCUS AUREUS  Final      Susceptibility   Methicillin resistant staphylococcus aureus - MIC*    CIPROFLOXACIN 4 RESISTANT Resistant     ERYTHROMYCIN >=8 RESISTANT Resistant     GENTAMICIN <=0.5 SENSITIVE Sensitive     OXACILLIN >=4 RESISTANT Resistant     TETRACYCLINE >=16 RESISTANT Resistant     VANCOMYCIN <=0.5 SENSITIVE Sensitive     TRIMETH/SULFA <=10 SENSITIVE Sensitive     CLINDAMYCIN <=0.25 SENSITIVE Sensitive     RIFAMPIN <=0.5 SENSITIVE Sensitive     Inducible Clindamycin NEGATIVE Sensitive     * METHICILLIN RESISTANT STAPHYLOCOCCUS AUREUS  Respiratory Panel by RT PCR (Flu Eulala Newcombe&B, Covid) - Nasopharyngeal Swab     Status: None  Collection Time: 10/19/19  8:07 PM   Specimen: Nasopharyngeal Swab  Result Value Ref Range Status   SARS Coronavirus 2 by RT PCR NEGATIVE NEGATIVE Final    Comment: (NOTE) SARS-CoV-2 target nucleic acids are NOT DETECTED. The SARS-CoV-2 RNA is generally detectable in upper respiratoy specimens during the acute phase of infection. The lowest concentration of SARS-CoV-2 viral copies this assay can detect is 131 copies/mL. Greidy Sherard negative result does not preclude SARS-Cov-2 infection and should not be used as the sole basis for treatment or other  patient management decisions. Theophil Thivierge negative result may occur with  improper specimen collection/handling, submission of specimen other than nasopharyngeal swab, presence of viral mutation(s) within the areas targeted by this assay, and inadequate number of viral copies (<131 copies/mL). Roczen Waymire negative result must be combined with clinical observations, patient history, and epidemiological information. The expected result is Negative. Fact Sheet for Patients:  PinkCheek.be Fact Sheet for Healthcare Providers:  GravelBags.it This test is not yet ap proved or cleared by the [place] FDA and  has been authorized for detection and/or diagnosis of SARS-CoV-2 by FDA under an Emergency Use Authorization (EUA). This EUA will remain  in effect (meaning this test can be used) for the duration of the COVID-19 declaration under Section 564(b)(1) of the Act, 21 U.S.C. section 360bbb-3(b)(1), unless the authorization is terminated or revoked sooner.    Influenza Yareth Macdonnell by PCR NEGATIVE NEGATIVE Final   Influenza B by PCR NEGATIVE NEGATIVE Final    Comment: (NOTE) The Xpert Xpress SARS-CoV-2/FLU/RSV assay is intended as an aid in  the diagnosis of influenza from Nasopharyngeal swab specimens and  should not be used as Shanesha Bednarz sole basis for treatment. Nasal washings and  aspirates are unacceptable for Xpert Xpress SARS-CoV-2/FLU/RSV  testing. Fact Sheet for Patients: PinkCheek.be Fact Sheet for Healthcare Providers: GravelBags.it This test is not yet approved or cleared by the [place] FDA and  has been authorized for detection and/or diagnosis of SARS-CoV-2 by  FDA under an Emergency Use Authorization (EUA). This EUA will remain  in effect (meaning this test can be used) for the duration of the  Covid-19 declaration under Section 564(b)(1) of the Act, 21  U.S.C. section 360bbb-3(b)(1), unless  the authorization is  terminated or revoked. Performed at [facility], [place] [address], [place], [place] [address]   Blood culture (routine x 2)     Status: None (Preliminary result)   Collection Time: 10/20/19  5:04 AM   Specimen: BLOOD  Result Value Ref Range Status   Specimen Description   Final    BLOOD LEFT ANTECUBITAL Performed at [place] [address] [place], [place] [address]    Special Requests   Final    BOTTLES DRAWN AEROBIC ONLY Blood Culture adequate volume Performed at [place] [address], [place], [place] [address]    Culture   Final    NO GROWTH 4 DAYS Performed at [facility], [place] [address] [place], [place] [address]    Report Status PENDING  Incomplete  Culture, blood (Routine X 2) w Reflex to ID Panel     Status: None (Preliminary result)   Collection Time: 10/21/19  4:35 PM   Specimen: BLOOD LEFT HAND  Result Value Ref Range Status   Specimen Description BLOOD LEFT HAND  Final   Special Requests   Final    BOTTLES DRAWN AEROBIC ONLY Blood Culture adequate volume   Culture   Final  NO GROWTH 3 DAYS Performed at [facility], [place] [address] [place], [place] [address]    Report Status PENDING  Incomplete  Culture, blood (Routine X 2) w Reflex to ID Panel     Status: None (Preliminary result)   Collection Time: 10/21/19  4:35 PM   Specimen: BLOOD LEFT HAND  Result Value Ref Range Status   Specimen Description BLOOD LEFT HAND  Final   Special Requests   Final    BOTTLES DRAWN AEROBIC AND ANAEROBIC Blood Culture adequate volume   Culture   Final    NO GROWTH 3 DAYS Performed at [facility], [place] [address] [place], [place] [address]    Report Status PENDING  Incomplete  SARS CORONAVIRUS 2 (TAT 6-24 HRS) Nasopharyngeal Nasopharyngeal Swab     Status: None   Collection Time: 10/23/19  1:30 AM   Specimen: Nasopharyngeal Swab  Result Value Ref Range Status    SARS Coronavirus 2 NEGATIVE NEGATIVE Final    Comment: (NOTE) SARS-CoV-2 target nucleic acids are NOT DETECTED. The SARS-CoV-2 RNA is generally detectable in upper and lower respiratory specimens during the acute phase of infection. Negative results do not preclude SARS-CoV-2 infection, do not rule out co-infections with other pathogens, and should not be used as the sole basis for treatment or other patient management decisions. Negative results must be combined with clinical observations, patient history, and epidemiological information. The expected result is Negative. Fact Sheet for Patients: SugarRoll.be Fact Sheet for Healthcare Providers: https://www.woods-mathews.com/ This test is not yet approved or cleared by the [place] FDA and  has been authorized for detection and/or diagnosis of SARS-CoV-2 by FDA under an Emergency Use Authorization (EUA). This EUA will remain  in effect (meaning this test can be used) for the duration of the COVID-19 declaration under Section 56 4(b)(1) of the Act, 21 U.S.C. section 360bbb-3(b)(1), unless the authorization is terminated or revoked sooner. Performed at [facility], [place] [address] [place], [place] [address]   Surgical pcr screen     Status: Abnormal   Collection Time: 10/23/19  3:27 AM   Specimen: Nasal Mucosa; Nasal Swab  Result Value Ref Range Status   MRSA, PCR POSITIVE (Virgil Lightner) NEGATIVE Final    Comment: RESULT CALLED TO, READ BACK BY AND VERIFIED WITH: COLLIE,L RN [phone] 10/23/2019 MITCHELL,L    Staphylococcus aureus POSITIVE (Daelyn Mozer) NEGATIVE Final    Comment: (NOTE) The Xpert SA Assay (FDA approved for NASAL specimens in patients 47 years of age and older), is one component of Aidian Salomon comprehensive surveillance program. It is not intended to diagnose infection nor to guide or monitor treatment.          Radiology Studies: [place] EKG SITE RITE  Result Date: 10/24/2019 If Site Rite  image not attached, placement could not be confirmed due to current cardiac rhythm.       Scheduled Meds: . Chlorhexidine Gluconate Cloth  6 each Topical Q0600  . docusate sodium  100 mg Oral BID  . insulin aspart  0-9 Units Subcutaneous Q4H  . mupirocin ointment  1 application Nasal BID  . QUEtiapine  300 mg Oral QHS  . sertraline  50 mg Oral Palmer  . traZODone  150 mg Oral QHS   Continuous Infusions: . sodium chloride Stopped (10/24/19 0159)  . lactated ringers 500 mL/hr at 10/23/19 1329  . vancomycin Stopped (10/24/19 0201)     LOS: 1 day    Time spent: over 30 min  Fayrene Helper, MD [facility]   To contact the attending provider between 7A-7P or the covering provider during after hours 7P-7A, please log into the web site [email or web address] and access using universal [place] password for that web site. If you do not have the password, please call the hospital operator.  10/24/2019, 2:47 PM

## 2019-10-24 NOTE — Progress Notes (Addendum)
During Hourly Rounding , patient had removed shrink sock and Prosthetic Shrink device. Encouraged pt to reapply shrink sock and prosthetic device.  Pt refused. Wound vac in place no drainage noted.

## 2019-10-24 NOTE — Plan of Care (Signed)

## 2019-10-24 NOTE — Anesthesia Preprocedure Evaluation (Addendum)
Anesthesia Evaluation  Patient identified by MRN, date of birth, ID band Patient awake    Reviewed: Allergy & Precautions, NPO status , Patient's Chart, lab work & pertinent test results  Airway Mallampati: II  TM Distance: >3 FB Neck ROM: Full    Dental no notable dental hx. (+) Poor Dentition, Chipped, Missing, Loose, Edentulous Upper,    Pulmonary Current Smoker and Patient abstained from smoking.,    Pulmonary exam normal breath sounds clear to auscultation       Cardiovascular hypertension, Pt. on medications Normal cardiovascular exam+ dysrhythmias Atrial Fibrillation  Rhythm:Regular Rate:Normal  ECHO 3/21 FINDINGS  Left Ventricle: Left ventricular ejection fraction, by estimation, is 60  to 65%. The left ventricle has normal function. The left ventricle has no  regional wall motion abnormalities. The left ventricular internal cavity  size was normal in size. There is  mild left ventricular hypertrophy. Left ventricular diastolic function  could not be evaluated due to atrial fibrillation. Left ventricular  diastolic function could not be evaluated.    Neuro/Psych PSYCHIATRIC DISORDERS Anxiety Bipolar Disorder  Neuromuscular disease    GI/Hepatic negative GI ROS, Neg liver ROS,   Endo/Other  diabetes, Type 2, Insulin Dependent  Renal/GU negative Renal ROSK+ 3.4 Cr 0.64     Musculoskeletal   Abdominal   Peds  Hematology  (+) anemia , Hgb 7.7 Plt 201   Anesthesia Other Findings   Reproductive/Obstetrics                           Anesthesia Physical  Anesthesia Plan  ASA: III  Anesthesia Plan: MAC   Post-op Pain Management:    Induction:   PONV Risk Score and Plan: 1 and Treatment may vary due to age or medical condition  Airway Management Planned: Nasal Cannula, Natural Airway and Mask  Additional Equipment: None  Intra-op Plan:   Post-operative Plan:   Informed  Consent: I have reviewed the patients History and Physical, chart, labs and discussed the procedure including the risks, benefits and alternatives for the proposed anesthesia with the patient or authorized representative who has indicated his/her understanding and acceptance.     Dental advisory given  Plan Discussed with: Anesthesiologist and CRNA  Anesthesia Plan Comments:        Anesthesia Quick Evaluation

## 2019-10-24 NOTE — Progress Notes (Signed)
Patient is not agreeable for PICC placement at the time due to uncertainty of long term antibiotic therapy post hospital stay.Wish to discuss further  His physcian.

## 2019-10-24 NOTE — Progress Notes (Signed)
Rehab Admissions Coordinator Note:  Per PT recommendation, this patient was screened by Cheri Rous for appropriateness for an Inpatient Acute Rehab Consult.  Noted OT is recommending SNF and pt's PT session was limited by pain. Per chart review, pt also noted to have some mild cognitive impairments. If pt lives alone and has no support planned at DC, would recommend SNF. At this time, we will follow for greater tolerance for therapies. Will request consult order if appropriate.   Cheri Rous 10/24/2019, 12:08 PM  I can be reached at [phone].

## 2019-10-24 NOTE — Plan of Care (Signed)
  Problem: Safety: Goal: Ability to remain free from injury will improve Outcome: Progressing   

## 2019-10-24 NOTE — Progress Notes (Signed)
    [facility] has been requested to perform a transesophageal echocardiogram on Cory Palmer for bacteremia. After careful review of history and examination, the risks and benefits of transesophageal echocardiogram have been explained including risks of esophageal damage, perforation (1:10,000 risk), bleeding, pharyngeal hematoma as well as other potential complications associated with conscious sedation including aspiration, arrhythmia, respiratory failure and death. Alternatives to treatment were discussed, questions were answered. Patient is willing to proceed. Hgb is low today 7.8, has been trending around 9. Patient denies active bleeding. CBC tomorrow AM prior to procedure.    Nirel Babler David Stall, PA-C  10/24/2019 3:03 PM

## 2019-10-24 NOTE — Progress Notes (Signed)
Patient is postop day 1 status post below-knee amputation.  He has found the limb protector uncomfortable and is removed it.  I am okay with this but I did remind him that he when he is up and about he needs to have it in place.  0 cc in canister in canister is functioning.  Although the patient was given a prescription for his right BKA for [facility] last summer he says he does prefer [employer].  I will be sure that this is what is ordered when we go to order his final prosthetic plan for skilled nursing will need 1 week follow-up

## 2019-10-25 ENCOUNTER — Encounter (HOSPITAL_COMMUNITY): Payer: Self-pay | Admitting: Family Medicine

## 2019-10-25 ENCOUNTER — Inpatient Hospital Stay (HOSPITAL_COMMUNITY): Payer: Medicare Other | Admitting: Certified Registered Nurse Anesthetist

## 2019-10-25 ENCOUNTER — Inpatient Hospital Stay (HOSPITAL_COMMUNITY): Payer: Medicare Other

## 2019-10-25 ENCOUNTER — Encounter (HOSPITAL_COMMUNITY)
Admission: EM | Disposition: A | Discharge status: Home Health | Payer: Self-pay | Source: Home / Self Care | Attending: Family Medicine

## 2019-10-25 DIAGNOSIS — I340 Nonrheumatic mitral (valve) insufficiency: Secondary | ICD-10-CM

## 2019-10-25 DIAGNOSIS — R7881 Bacteremia: Secondary | ICD-10-CM

## 2019-10-25 HISTORY — PX: TEE WITHOUT CARDIOVERSION: SHX5443

## 2019-10-25 LAB — CBC
HCT: 27.9 % — ABNORMAL LOW (ref 39.0–52.0)
Hemoglobin: 8.9 g/dL — ABNORMAL LOW (ref 13.0–17.0)
MCH: 30.2 pg (ref 26.0–34.0)
MCHC: 31.9 g/dL (ref 30.0–36.0)
MCV: 94.6 fL (ref 80.0–100.0)
Platelets: 214 10*3/uL (ref 150–400)
RBC: 2.95 MIL/uL — ABNORMAL LOW (ref 4.22–5.81)
RDW: 14.8 % (ref 11.5–15.5)
WBC: 6.7 10*3/uL (ref 4.0–10.5)
nRBC: 0 % (ref 0.0–0.2)

## 2019-10-25 LAB — COMPREHENSIVE METABOLIC PANEL
ALT: 19 U/L (ref 0–44)
AST: 31 U/L (ref 15–41)
Albumin: 2 g/dL — ABNORMAL LOW (ref 3.5–5.0)
Alkaline Phosphatase: 185 U/L — ABNORMAL HIGH (ref 38–126)
Anion gap: 10 (ref 5–15)
BUN: 5 mg/dL — ABNORMAL LOW (ref 6–20)
CO2: 21 mmol/L — ABNORMAL LOW (ref 22–32)
Calcium: 7.9 mg/dL — ABNORMAL LOW (ref 8.9–10.3)
Chloride: 107 mmol/L (ref 98–111)
Creatinine, Ser: 0.83 mg/dL (ref 0.61–1.24)
GFR calc Af Amer: >60 mL/min (ref >60)
GFR calc non Af Amer: >60 mL/min (ref >60)
Glucose, Bld: 163 mg/dL — ABNORMAL HIGH (ref 70–99)
Potassium: 4 mmol/L (ref 3.5–5.1)
Sodium: 138 mmol/L (ref 135–145)
Total Bilirubin: 1.1 mg/dL (ref 0.3–1.2)
Total Protein: 6.7 g/dL (ref 6.5–8.1)

## 2019-10-25 LAB — GLUCOSE, CAPILLARY
Glucose-Capillary: 123 mg/dL — ABNORMAL HIGH (ref 70–99)
Glucose-Capillary: 127 mg/dL — ABNORMAL HIGH (ref 70–99)
Glucose-Capillary: 138 mg/dL — ABNORMAL HIGH (ref 70–99)
Glucose-Capillary: 142 mg/dL — ABNORMAL HIGH (ref 70–99)
Glucose-Capillary: 146 mg/dL — ABNORMAL HIGH (ref 70–99)
Glucose-Capillary: 148 mg/dL — ABNORMAL HIGH (ref 70–99)
Glucose-Capillary: 166 mg/dL — ABNORMAL HIGH (ref 70–99)

## 2019-10-25 LAB — CULTURE, BLOOD (ROUTINE X 2)
Culture: NO GROWTH
Special Requests: ADEQUATE

## 2019-10-25 LAB — SURGICAL PATHOLOGY

## 2019-10-25 LAB — MAGNESIUM: Magnesium: 1.6 mg/dL — ABNORMAL LOW (ref 1.7–2.4)

## 2019-10-25 LAB — PHOSPHORUS: Phosphorus: 3.3 mg/dL (ref 2.5–4.6)

## 2019-10-25 SURGERY — ECHOCARDIOGRAM, TRANSESOPHAGEAL
Anesthesia: Monitor Anesthesia Care

## 2019-10-25 MED ORDER — DEXMEDETOMIDINE HCL 200 MCG/2ML IV SOLN
INTRAVENOUS | Status: DC | PRN
  Administered 2019-10-25 (×2): 8 ug via INTRAVENOUS

## 2019-10-25 MED ORDER — SODIUM CHLORIDE 0.9 % IV SOLN: INTRAVENOUS | Status: DC

## 2019-10-25 MED ORDER — MAGNESIUM SULFATE 2 GM/50ML IV SOLN
2.0000 g | Freq: Once | INTRAVENOUS | Status: AC
  Administered 2019-10-25: 2 g via INTRAVENOUS
  Filled 2019-10-25: qty 50

## 2019-10-25 MED ORDER — PROPOFOL 500 MG/50ML IV EMUL
INTRAVENOUS | Status: DC | PRN
  Administered 2019-10-25: 100 ug/kg/min via INTRAVENOUS

## 2019-10-25 NOTE — Progress Notes (Signed)
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 PMH significant for right BKA, DM, liver cirrhosis, Afib,  and bipolar disorder.    PT Comments    Pt making steady progress with functional mobility. Per CIR AC note from 3/25, if pt does not have reliable support at d/c then they would not recommend CIR. Pt does not have support as pt's brother lives in [place], [place] and will not be staying with him. Therefore, updated recs to SNF. Pt would continue to benefit from skilled physical therapy services at this time while admitted and after d/c to address the below listed limitations in order to improve overall safety and independence with functional mobility.    Follow Up Recommendations  SNF     Equipment Recommendations  Other (comment)(defer to next venue of care)    Recommendations for Other Services       Precautions / Restrictions Precautions Precautions: Fall Precaution Comments: wound VAC Restrictions Weight Bearing Restrictions: Yes Other Position/Activity Restrictions: NWB L LE    Mobility  Bed Mobility Overal bed mobility: Needs Assistance Bed Mobility: Supine to Sit;Sit to Supine     Supine to sit: Supervision Sit to supine: Supervision   General bed mobility comments: with use of bed rails, pt able to achieve long sitting and sit EOB without difficulties  Transfers                 General transfer comment: pt able to posteriorly scoot in bed towards [facility] with use of bilateral UEs  Ambulation/Gait                 Stairs             Wheelchair Mobility    Modified Rankin (Stroke Patients Only)       Balance Overall balance assessment: Needs assistance Sitting-balance support: No upper extremity supported Sitting balance-Leahy Scale: Fair                                      Cognition Arousal/Alertness: Awake/alert Behavior During Therapy: WFL for tasks assessed/performed Overall Cognitive Status: Impaired/Different from baseline Area of Impairment: Safety/judgement;Memory;Problem solving                     Memory: Decreased short-term memory;Decreased recall of precautions   Safety/Judgement: Decreased awareness of safety;Decreased awareness of deficits   Problem Solving: Slow processing;Difficulty sequencing;Requires verbal cues;Requires tactile cues        Exercises Amputee Exercises Hip ABduction/ADduction: AROM;Strengthening;Left;10 reps;Supine Knee Flexion: AROM;Strengthening;Left;10 reps;Seated Knee Extension: AROM;Strengthening;Left;10 reps;Seated    General Comments        Pertinent Vitals/Pain Pain Assessment: Faces Faces Pain Scale: Hurts even more Pain Location: L residual limb Pain Descriptors / Indicators: Discomfort;Aching;Nagging Pain Intervention(s): Monitored during session;Repositioned    Home Living                      Prior Function            PT Goals (current goals can now be found in the care plan section) Acute Rehab PT Goals PT Goal Formulation: With patient Time For Goal Achievement: 11/07/19 Potential to Achieve Goals: Fair Progress towards PT goals: Progressing toward goals    Frequency    Min 3X/week  PT Plan Discharge plan needs to be updated;Frequency needs to be updated    Co-evaluation              AM-PAC PT "6 Clicks" Mobility   Outcome Measure  Help needed turning from your back to your side while in a flat bed without using bedrails?: A Little Help needed moving from lying on your back to sitting on the side of a flat bed without using bedrails?: A Little Help needed moving to and from a bed to a chair (including a wheelchair)?: A Lot Help needed standing up from a chair using your arms (e.g., wheelchair or bedside chair)?: Total Help needed to  walk in hospital room?: Total Help needed climbing 3-5 steps with a railing? : Total 6 Click Score: 11    End of Session   Activity Tolerance: Patient tolerated treatment well Patient left: in bed;with call bell/phone within reach;with bed alarm set Nurse Communication: Mobility status PT Visit Diagnosis: Other abnormalities of gait and mobility (R26.89);Muscle weakness (generalized) (M62.81)     Time: 1511-1530 PT Time Calculation (min) (ACUTE ONLY): 19 min  Charges:  $Therapeutic Activity: 8-22 mins                     Anastasio Champion, DPT  Acute Rehabilitation Services Pager [phone] Office [place] 10/25/2019, 4:52 PM

## 2019-10-25 NOTE — Anesthesia Postprocedure Evaluation (Signed)
Anesthesia Post Note  Patient: Cory Palmer  Procedure(s) Performed: AMPUTATION BELOW KNEE (Left Knee)     Patient location during evaluation: PACU Anesthesia Type: Regional and MAC Level of consciousness: awake and alert Pain management: pain level controlled Vital Signs Assessment: post-procedure vital signs reviewed and stable Respiratory status: spontaneous breathing, nonlabored ventilation, respiratory function stable and patient connected to nasal cannula oxygen Cardiovascular status: stable and blood pressure returned to baseline Postop Assessment: no apparent nausea or vomiting Anesthetic complications: no    Last Vitals:  Vitals:   10/25/19 0938 10/25/19 1700  BP: 117/73 110/65  Pulse: 75 63  Resp: 18 17  Temp: 37 C 37.1 C  SpO2: 96% 98%    Last Pain:  Vitals:   10/25/19 1700  TempSrc: Oral  PainSc:                  [place]

## 2019-10-25 NOTE — Progress Notes (Signed)
Inpatient Rehab Admissions:  Inpatient Rehab Consult received.  Please see my colleague, Pascal Lux, note from 3/25.  Pt has not been able to participate with therapy today.  Will f/u on Monday once pt has had a chance to work with therapy to see if he is appropriate for CIR.    Signed: Estill Dooms, PT, DPT Admissions Coordinator [phone] 10/25/19  1:15 PM

## 2019-10-25 NOTE — Progress Notes (Signed)
  Echocardiogram Echocardiogram Transesophageal has been performed.  Gerda Diss 10/25/2019, 8:44 AM

## 2019-10-25 NOTE — Progress Notes (Signed)
PROGRESS NOTE    Cory Palmer  OIZ:[id number] DOB: May 12, 1961 DOA: 10/22/2019 PCP: Patient, [name] Per   Brief Narrative: Cory Palmer is Cory Palmer 59 y.o. male with medical history significant for bipolar disorder, liver cirrhosis, type 2 diabetes mellitus, recent admission with osteomyelitis, MRSA bacteremia with left foot with suspected source, and was planned for surgery left AMA on 10/21/2019 now returns requesting resumption of his care.  Patient reports that he was able to attend to some business that had required to leave AMA, is now dedicated to treatment of his infection, and states that his brother is planning to come from out of town to assist him in his recovery.  Patient denies any fevers or chills, has not been taking any medications since leaving the hospital, and denies any chest pain, abdominal pain, cough, or shortness of breath.  He continues to have some bloody and purulent drainage from the left foot.  ED Course: Upon arrival to the ED, patient is found to be afebrile, saturating, stable blood pressure.  Chemistry panel with sodium 132 her bicarbonate is 19.  CBC with leukocytosis to 10,900 and stable normocytic anemia with hemoglobin 8.5.  Covid PCR screening test has been ordered hospitalists consulted for admission.  Assessment & Plan:   Principal Problem:   MRSA bacteremia Active Problems:   Diabetes mellitus type II, non insulin dependent (HCC)   S/P transmetatarsal amputation of foot, left (HCC)   Depressed bipolar disorder (HCC)   PAF (paroxysmal atrial fibrillation) (HCC)   Osteomyelitis of left foot (HCC)   Hyponatremia  1. MRSA bacteremia; left foot osteomyelitis  - Patient was admitted with worsening foot wound on 3/20 - plain films concerning for osteomyelitis - blood culture from 3/20 growing MRSA - repeat blood cx 3/22 NGTD  - echo 3/22 -> EF 60-65% - evidence of atrial level shunting.   - TEE 3/26 without vegetations - Continue vancomycin.  ID  recommending 12 more days.  Unasyn now d/c'd after Cory Palmer BKA. - ID c/s, appreciate recs - recommending PICC on 3/25, TEE (plan for Friday per cardiology), continue abx - Ortho c/s, s/p transtibial amputation on 3/24  2. Paroxysmal atrial fibrillation  - Not anticoagulated prior to admission, CHADS-VASc may only be 1 (DM), but also poor candidate due to chronic anemia and poor adherence   3. Type II DM  - A1c was 6,8% earlier this month  - Check CBGs and use Tysha Grismore low-intensity SSI for now    4. Hyponatremia  - Serum sodium is 132 in ED, similar to priors, appears euvolemic, likely related to cirrhosis, will monitor    # Hypomagnesemia: replace and follow  5. Bipolar disorder  - Continue Seroquel, Zoloft, trazodone    6. Anemia  Iron Def Anemia - Trend H/H - stable - iron, b12, folate, ferritin - suggestive of iron def anemia - Patient denies melena or hematochezia; likely related to cirrhosis, chronic infection  - Needs f/u for IDA, CRC screening if not done  7. Cirrhosis  - Appears compensated   DVT prophylaxis: SCD Code Status: full Family Communication: brother at bedside Disposition Plan:  . Patient came from: home            . Anticipated d/c place:home . Barriers to d/c OR conditions which need to be met to effect Cory Palmer safe d/c: pending TEE, clearance by ortho   Consultants:   orthopedics  Procedures:  3/24 - s/p Cory Palmer transtibial amputation  Echo IMPRESSIONS    1. Normal  LV function; trace AI; mild MR and TR; moderate LAE; no  vegetatioins.  2. Left ventricular ejection fraction, by estimation, is 55 to 60%. The  left ventricle has normal function. The left ventricle has no regional  wall motion abnormalities.  3. Right ventricular systolic function is normal. The right ventricular  size is normal.  4. Left atrial size was moderately dilated. No left atrial/left atrial  appendage thrombus was detected.  5. The mitral valve is normal in structure. Mild  mitral valve  regurgitation.  6. The aortic valve is tricuspid. Aortic valve regurgitation is trivial.  Mild aortic valve sclerosis is present, with no evidence of aortic valve  stenosis.  7. There is mild (Grade II) plaque involving the descending aorta.   Antimicrobials:  Anti-infectives (From admission, onward)   Start     Dose/Rate Route Frequency Ordered Stop   10/23/19 1400  vancomycin (VANCOREADY) IVPB 1250 mg/250 mL     1,250 mg 166.7 mL/hr over 90 Minutes Intravenous Every 12 hours 10/23/19 0129     10/23/19 1030  ceFAZolin (ANCEF) IVPB 2g/100 mL premix  Status:  Discontinued     2 g 200 mL/hr over 30 Minutes Intravenous On call to O.R. 10/23/19 1026 10/23/19 1412   10/23/19 1000  Ampicillin-Sulbactam (UNASYN) 3 g in sodium chloride 0.9 % 100 mL IVPB  Status:  Discontinued     3 g 200 mL/hr over 30 Minutes Intravenous Every 6 hours 10/23/19 0858 10/24/19 1239   10/23/19 0130  vancomycin (VANCOREADY) IVPB 2000 mg/400 mL     2,000 mg 200 mL/hr over 120 Minutes Intravenous  Once 10/23/19 0128 10/23/19 0630     Subjective: No new complaints Not interested in SNF  Objective: Vitals:   10/25/19 0845 10/25/19 0855 10/25/19 0915 10/25/19 0938  BP: 100/61 103/61 123/72 117/73  Pulse: 73 85 80 75  Resp: 11 20 16 18   Temp:    98.6 F (37 C)  TempSrc:      SpO2: 95% 96% 95% 96%  Weight:      Height:        Intake/Output Summary (Last 24 hours) at 10/25/2019 1717 Last data filed at 10/25/2019 0824 Gross per 24 hour  Intake 475 ml  Output 1800 ml  Net -1325 ml   Filed Weights   10/22/19 2329 10/25/19 0734  Weight: 113.9 kg 113.9 kg    Examination:  General: No acute distress. Cardiovascular: Heart sounds show Cory Palmer regular rate, and rhythm. Lungs: Clear to auscultation bilaterally  Abdomen: Soft, nontender, nondistended  Neurological: Alert and oriented 3. Moves all extremities 4. Cranial nerves II through XII grossly intact. Skin: Warm and dry. No rashes or  lesions. Extremities: LLE s/p BKA, wound vac   Data Reviewed: I have personally reviewed following labs and imaging studies  CBC: Recent Labs  Lab 10/19/19 1451 10/20/19 0504 10/21/19 0228 10/21/19 0228 10/22/19 1924 10/23/19 0532 10/23/19 1638 10/24/19 0440 10/25/19 0403  WBC 13.8*   < > 8.4  --  10.9* 7.6  --  5.6 6.7  NEUTROABS 11.4*  --   --   --   --  5.1  --  3.7  --   HGB 9.4*   < > 8.2*   < > 8.5* 7.7* 7.8* 7.8* 8.9*  HCT 29.0*   < > 25.2*   < > 25.9* 23.1* 24.7* 24.5* 27.9*  MCV 94.2   < > 93.0  --  93.2 93.1  --  93.9 94.6  PLT 278   < >  207  --  243 201  --  196 214   < > = values in this interval not displayed.   Basic Metabolic Panel: Recent Labs  Lab 10/20/19 0504 10/20/19 0504 10/21/19 0228 10/22/19 1924 10/23/19 0532 10/24/19 0440 10/25/19 0403  NA 132*   < > 130* 132* 138 136 138  K 3.4*   < > 3.6 3.7 3.4* 3.8 4.0  CL 104   < > 103 102 108 107 107  CO2 19*   < > 19* 19* 22 21* 21*  GLUCOSE 144*   < > 180* 155* 115* 119* 163*  BUN 6   < > 6 8 6  <5* 5*  CREATININE 0.58*   < > 0.66 0.67 0.64 0.63 0.83  CALCIUM 7.6*   < > 7.6* 8.2* 8.0* 7.6* 7.9*  MG 1.8  --  1.6*  --  1.7 1.6* 1.6*  PHOS  --   --   --   --   --  3.1 3.3   < > = values in this interval not displayed.   GFR: Estimated Creatinine Clearance: 132.3 mL/min (by C-G formula based on SCr of 0.83 mg/dL). Liver Function Tests: Recent Labs  Lab 10/19/19 1451 10/20/19 0504 10/23/19 0532 10/24/19 0440 10/25/19 0403  AST 33 24 31 37 31  ALT 19 16 17 18 19   ALKPHOS 201* 170* 184* 178* 185*  BILITOT 2.3* 2.1* 1.3* 1.4* 1.1  PROT 7.5 6.3* 6.2* 6.4* 6.7  ALBUMIN 2.6* 2.1* 1.9* 1.8* 2.0*   Recent Labs  Lab 10/19/19 1657  LIPASE 25   No results for input(s): AMMONIA in the last 168 hours. Coagulation Profile: Recent Labs  Lab 10/23/19 0532  INR 1.4*   Cardiac Enzymes: No results for input(s): CKTOTAL, CKMB, CKMBINDEX, TROPONINI in the last 168 hours. BNP (last 3 results) No  results for input(s): PROBNP in the last 8760 hours. HbA1C: No results for input(s): HGBA1C in the last 72 hours. CBG: Recent Labs  Lab 10/24/19 2002 10/25/19 0006 10/25/19 0350 10/25/19 0738 10/25/19 1128  GLUCAP 127* 146* 142* 127* 123*   Lipid Profile: No results for input(s): CHOL, HDL, LDLCALC, TRIG, CHOLHDL, LDLDIRECT in the last 72 hours. Thyroid Function Tests: No results for input(s): TSH, T4TOTAL, FREET4, T3FREE, THYROIDAB in the last 72 hours. Anemia Panel: Recent Labs    10/23/19 1638  VITAMINB12 950*  FOLATE 23.4  FERRITIN 140  TIBC 179*  IRON 22*   Sepsis Labs: Recent Labs  Lab 10/19/19 1451 10/19/19 1804  LATICACIDVEN 4.5* 2.5*    Recent Results (from the past 240 hour(s))  Blood culture (routine x 2)     Status: Abnormal   Collection Time: 10/19/19  2:51 PM   Specimen: BLOOD RIGHT FOREARM  Result Value Ref Range Status   Specimen Description   Final    BLOOD RIGHT FOREARM Performed at [facility], [place] [address] [place], [place] [address]    Special Requests   Final    BOTTLES DRAWN AEROBIC AND ANAEROBIC Blood Culture adequate volume Performed at [facility] [address] [place], [place] [address]    Culture  Setup Time   Final    GRAM POSITIVE COCCI IN CLUSTERS IN BOTH AEROBIC AND ANAEROBIC BOTTLES CRITICAL RESULT CALLED TO, READ BACK BY AND VERIFIED WITH: [place] [id number] AT 6789 BY CM Performed at [facility], [place] [address], [place], [place] [address]    Culture METHICILLIN RESISTANT STAPHYLOCOCCUS AUREUS (Tawonda Legaspi)  Final   Report Status 10/22/2019 FINAL  Final   Organism ID, Bacteria METHICILLIN RESISTANT STAPHYLOCOCCUS AUREUS  Final      Susceptibility   Methicillin resistant staphylococcus aureus - MIC*    CIPROFLOXACIN 4 RESISTANT Resistant     ERYTHROMYCIN >=8 RESISTANT Resistant     GENTAMICIN <=0.5 SENSITIVE Sensitive     OXACILLIN >=4 RESISTANT Resistant     TETRACYCLINE >=16  RESISTANT Resistant     VANCOMYCIN <=0.5 SENSITIVE Sensitive     TRIMETH/SULFA <=10 SENSITIVE Sensitive     CLINDAMYCIN <=0.25 SENSITIVE Sensitive     RIFAMPIN <=0.5 SENSITIVE Sensitive     Inducible Clindamycin NEGATIVE Sensitive     * METHICILLIN RESISTANT STAPHYLOCOCCUS AUREUS  Respiratory Panel by RT PCR (Flu Jehan Bonano&B, Covid) - Nasopharyngeal Swab     Status: None   Collection Time: 10/19/19  8:07 PM   Specimen: Nasopharyngeal Swab  Result Value Ref Range Status   SARS Coronavirus 2 by RT PCR NEGATIVE NEGATIVE Final    Comment: (NOTE) SARS-CoV-2 target nucleic acids are NOT DETECTED. The SARS-CoV-2 RNA is generally detectable in upper respiratoy specimens during the acute phase of infection. The lowest concentration of SARS-CoV-2 viral copies this assay can detect is 131 copies/mL. Cory Palmer negative result does not preclude SARS-Cov-2 infection and should not be used as the sole basis for treatment or other patient management decisions. Cory Palmer negative result may occur with  improper specimen collection/handling, submission of specimen other than nasopharyngeal swab, presence of viral mutation(s) within the areas targeted by this assay, and inadequate number of viral copies (<131 copies/mL). Cory Palmer negative result must be combined with clinical observations, patient history, and epidemiological information. The expected result is Negative. Fact Sheet for Patients:  PinkCheek.be Fact Sheet for Healthcare Providers:  GravelBags.it This test is not yet ap proved or cleared by the [place] FDA and  has been authorized for detection and/or diagnosis of SARS-CoV-2 by FDA under an Emergency Use Authorization (EUA). This EUA will remain  in effect (meaning this test can be used) for the duration of the COVID-19 declaration under Section 564(b)(1) of the Act, 21 U.S.C. section 360bbb-3(b)(1), unless the authorization is terminated or revoked  sooner.    Influenza [place] by PCR NEGATIVE NEGATIVE Final   Influenza B by PCR NEGATIVE NEGATIVE Final    Comment: (NOTE) The Xpert Xpress SARS-CoV-2/FLU/RSV assay is intended as an aid in  the diagnosis of influenza from Nasopharyngeal swab specimens and  should not be used as Cory Palmer sole basis for treatment. Nasal washings and  aspirates are unacceptable for Xpert Xpress SARS-CoV-2/FLU/RSV  testing. Fact Sheet for Patients: PinkCheek.be Fact Sheet for Healthcare Providers: GravelBags.it This test is not yet approved or cleared by the [place] FDA and  has been authorized for detection and/or diagnosis of SARS-CoV-2 by  FDA under an Emergency Use Authorization (EUA). This EUA will remain  in effect (meaning this test can be used) for the duration of the  Covid-19 declaration under Section 564(b)(1) of the Act, 21  U.S.C. section 360bbb-3(b)(1), unless the authorization is  terminated or revoked. Performed at [facility], [place] [address], [place], [place] [address]   Blood culture (routine x 2)     Status: None   Collection Time: 10/20/19  5:04 AM   Specimen: BLOOD  Result Value Ref Range Status   Specimen Description   Final    BLOOD LEFT ANTECUBITAL Performed at [facility] [address], [place], [place] [address]  Special Requests   Final    BOTTLES DRAWN AEROBIC ONLY Blood Culture adequate volume Performed at [facility] [address], [place], [place] [address]    Culture   Final    NO GROWTH 5 DAYS Performed at [facility], [place] [address], [place], [place] [address]    Report Status 10/25/2019 FINAL  Final  Culture, blood (Routine X 2) w Reflex to ID Panel     Status: None (Preliminary result)   Collection Time: 10/21/19  4:35 PM   Specimen: BLOOD LEFT HAND  Result Value Ref Range Status   Specimen Description BLOOD LEFT HAND  Final    Special Requests   Final    BOTTLES DRAWN AEROBIC ONLY Blood Culture adequate volume   Culture   Final    NO GROWTH 4 DAYS Performed at [facility], [place] [address] [place], [place] [address]    Report Status PENDING  Incomplete  Culture, blood (Routine X 2) w Reflex to ID Panel     Status: None (Preliminary result)   Collection Time: 10/21/19  4:35 PM   Specimen: BLOOD LEFT HAND  Result Value Ref Range Status   Specimen Description BLOOD LEFT HAND  Final   Special Requests   Final    BOTTLES DRAWN AEROBIC AND ANAEROBIC Blood Culture adequate volume   Culture   Final    NO GROWTH 4 DAYS Performed at [facility], [place] [address], [place], [place] [address]    Report Status PENDING  Incomplete  SARS CORONAVIRUS 2 (TAT 6-24 HRS) Nasopharyngeal Nasopharyngeal Swab     Status: None   Collection Time: 10/23/19  1:30 AM   Specimen: Nasopharyngeal Swab  Result Value Ref Range Status   SARS Coronavirus 2 NEGATIVE NEGATIVE Final    Comment: (NOTE) SARS-CoV-2 target nucleic acids are NOT DETECTED. The SARS-CoV-2 RNA is generally detectable in upper and lower respiratory specimens during the acute phase of infection. Negative results do not preclude SARS-CoV-2 infection, do not rule out co-infections with other pathogens, and should not be used as the sole basis for treatment or other patient management decisions. Negative results must be combined with clinical observations, patient history, and epidemiological information. The expected result is Negative. Fact Sheet for Patients: SugarRoll.be Fact Sheet for Healthcare Providers: https://www.woods-mathews.com/ This test is not yet approved or cleared by the [place] FDA and  has been authorized for detection and/or diagnosis of SARS-CoV-2 by FDA under an Emergency Use Authorization (EUA). This EUA will remain  in effect (meaning this test can be used) for the duration of  the COVID-19 declaration under Section 56 4(b)(1) of the Act, 21 U.S.C. section 360bbb-3(b)(1), unless the authorization is terminated or revoked sooner. Performed at [facility], [place] [address], [place], [place] [address]   Surgical pcr screen     Status: Abnormal   Collection Time: 10/23/19  3:27 AM   Specimen: Nasal Mucosa; Nasal Swab  Result Value Ref Range Status   MRSA, PCR POSITIVE (Cory Palmer) NEGATIVE Final    Comment: RESULT CALLED TO, READ BACK BY AND VERIFIED WITH: COLLIE,L RN [phone] 10/23/2019 Cory Palmer,Cory Palmer    Staphylococcus aureus POSITIVE (Cory Palmer) NEGATIVE Final    Comment: (NOTE) The Xpert SA Assay (FDA approved for NASAL specimens in patients 40 years of age and older), is one component of Cory Palmer comprehensive surveillance program. It is not intended to diagnose infection nor to guide or monitor treatment.  Radiology Studies: ECHO TEE  Result Date: 10/25/2019    TRANSESOPHOGEAL ECHO REPORT   Patient Name:   FINNEUS KANESHIRO Date of Exam: 10/25/2019 Medical Rec #:  [id number]     Height:       76.0 in Accession #:    [id number]    Weight:       251.0 lb Date of Birth:  20-Feb-1961    BSA:          2.440 m Patient Age:    50 years      BP:           105/73 mmHg Patient Gender: M             HR:           83 bpm. Exam Location:  Inpatient Procedure: Transesophageal Echo Indications:     Bacteremia 790.7/R78.81  History:         Patient has prior history of Echocardiogram examinations, most                  recent 10/21/2019. Arrythmias:Atrial Fibrillation; Risk                  Factors:Diabetes and Current Smoker. Osteomyelitis. MRSA                  bacteremia.  Sonographer:     Cory Palmer RDCS (AE) Referring Phys:  [id number] [place] Diagnosing Phys: Cory Palmer PROCEDURE: After discussion of the risks and benefits of Cherry Wittwer TEE, an informed consent was obtained from the patient. The transesophogeal probe was passed without difficulty through the esophogus of the patient.  Sedation performed by different physician. The patient was monitored while under deep sedation. Anesthestetic sedation was provided intravenously by Anesthesiology: 205.18m of Propofol. Image quality was adequate. The patient developed no complications during the procedure. Pt sedated by anesthesia with precedex 16 mg and diprovan 200 mg IV total. IMPRESSIONS  1. Normal LV function; trace AI; mild MR and TR; moderate LAE; no vegetatioins.  2. Left ventricular ejection fraction, by estimation, is 55 to 60%. The left ventricle has normal function. The left ventricle has no regional wall motion abnormalities.  3. Right ventricular systolic function is normal. The right ventricular size is normal.  4. Left atrial size was moderately dilated. No left atrial/left atrial appendage thrombus was detected.  5. The mitral valve is normal in structure. Mild mitral valve regurgitation.  6. The aortic valve is tricuspid. Aortic valve regurgitation is trivial. Mild aortic valve sclerosis is present, with no evidence of aortic valve stenosis.  7. There is mild (Grade II) plaque involving the descending aorta. FINDINGS  Left Ventricle: Left ventricular ejection fraction, by estimation, is 55 to 60%. The left ventricle has normal function. The left ventricle has no regional wall motion abnormalities. The left ventricular internal cavity size was normal in size. There is  no left ventricular hypertrophy. Right Ventricle: The right ventricular size is normal. Right ventricular systolic function is normal. Left Atrium: Left atrial size was moderately dilated. No left atrial/left atrial appendage thrombus was detected. Right Atrium: Right atrial size was normal in size. Pericardium: There is no evidence of pericardial effusion. Mitral Valve: The mitral valve is normal in structure. There is mild thickening of the mitral valve leaflet(s). Mild mitral valve regurgitation. Tricuspid Valve: The tricuspid valve is normal in structure.  Tricuspid valve regurgitation is mild. Aortic Valve: The aortic valve is tricuspid. Aortic valve regurgitation is trivial.  Mild aortic valve sclerosis is present, with no evidence of aortic valve stenosis. Pulmonic Valve: The pulmonic valve was grossly normal. Pulmonic valve regurgitation is not visualized. Aorta: The aortic root is normal in size and structure. There is mild (Grade II) plaque involving the descending aorta. IAS/Shunts: The interatrial septum appears to be lipomatous. No atrial level shunt detected by color flow Doppler. Additional Comments: Normal LV function; trace AI; mild MR and TR; moderate LAE; no vegetatioins.   AORTA Ao Root diam: 3.00 cm Cory Palmer Electronically signed by Cory Palmer Signature Date/Time: 10/25/2019/8:52:28 AM    Final    [place] EKG SITE RITE  Result Date: 10/24/2019 If Site Rite image not attached, placement could not be confirmed due to current cardiac rhythm.       Scheduled Meds: . Chlorhexidine Gluconate Cloth  6 each Topical Q0600  . docusate sodium  100 mg Oral BID  . insulin aspart  0-9 Units Subcutaneous Q4H  . mupirocin ointment  1 application Nasal BID  . QUEtiapine  300 mg Oral QHS  . sertraline  50 mg Oral Daily  . traZODone  150 mg Oral QHS   Continuous Infusions: . sodium chloride 75 mL/hr at 10/25/19 0740  . lactated ringers 500 mL/hr at 10/23/19 1329  . magnesium sulfate bolus IVPB    . vancomycin 1,250 mg (10/25/19 1456)     LOS: 2 days    Time spent: over 30 min    Cory Helper, [facility]   To contact the attending provider between 7A-7P or the covering provider during after hours 7P-7A, please log into the web site [email or web address] and access using universal [place] password for that web site. If you do not have the password, please call the hospital operator.  10/25/2019, 5:17 PM

## 2019-10-25 NOTE — Interval H&P Note (Signed)
History and Physical Interval Note:  10/25/2019 7:43 AM  Cory Palmer  has presented today for surgery, with the diagnosis of BACTERIMIA.  The various methods of treatment have been discussed with the patient and family. After consideration of risks, benefits and other options for treatment, the patient has consented to  Procedure(s): TRANSESOPHAGEAL ECHOCARDIOGRAM (TEE) (N/A) as a surgical intervention.  The patient's history has been reviewed, patient examined, no change in status, stable for surgery.  I have reviewed the patient's chart and labs.  Questions were answered to the patient's satisfaction.     Olga Millers

## 2019-10-25 NOTE — Progress Notes (Signed)
PHARMACY CONSULT NOTE FOR:  OUTPATIENT  PARENTERAL ANTIBIOTIC THERAPY (OPAT)  Indication: MRSA bacteremia Regimen: Vancomycin 1250 mg IV q12h End date: 11/06/2019  IV antibiotic discharge orders are pended. To discharging provider:  please sign these orders via discharge navigator,  Select New Orders & click on the button choice - Manage This Unsigned Work.     Thank you for allowing pharmacy to be a part of this patient's care.  Trixie Rude, PharmD Candidate 10/25/2019, 10:29 AM

## 2019-10-25 NOTE — Anesthesia Procedure Notes (Signed)
Procedure Name: MAC Performed by: Ladan Vanderzanden B, CRNA Pre-anesthesia Checklist: Patient identified, Emergency Drugs available, Suction available, Patient being monitored and Timeout performed Patient Re-evaluated:Patient Re-evaluated prior to induction Oxygen Delivery Method: Nasal cannula Preoxygenation: Pre-oxygenation with 100% oxygen Airway Equipment and Method: Bite block Placement Confirmation: positive ETCO2 Dental Injury: Teeth and Oropharynx as per pre-operative assessment        

## 2019-10-25 NOTE — Progress Notes (Signed)
[facility] for Infectious Disease  Date of Admission:  10/22/2019      Total days of antibiotics 7  Day 7 Vancomycin            ASSESSMENT: Cory Palmer is a 59 y.o. 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 He is now POD2 following definitive amputation. TEE was done and reveals no vegetations. Planning to give him 2 weeks from surgery date to complete therapy through April 7th.  Discussed with Dr. Florene Glen and hopefully he would be more inclined to CIR admission if it would afford him a shorter stay. He clearly wants to be as independent as possible and keep his apartment.  If he would not agree to CIR, I would try to keep him her in the hospital with supervised medical therapy and ongoing rehab for his new amputation as much as possible.    PLAN: 1. OPAT as outlined below.  2. Hopeful he considers SNF vs CIR vs Hospitalization for medical and physical/rehab therapy   Will sign off for now but please call back should he have a change in condition or changes to discharge plans. I don't feel like he would be a great candidate for home PICC line given immobility and reliance on upper body for transfers.   He can follow up with orthopedic team and primary provider at discharge.    Principal Problem:   MRSA bacteremia Active Problems:   Osteomyelitis of left foot (HCC)   Diabetes mellitus type II, non insulin dependent (HCC)   S/P transmetatarsal amputation of foot, left (HCC)   Depressed bipolar disorder (HCC)   PAF (paroxysmal atrial fibrillation) (HCC)   Hyponatremia   . Chlorhexidine Gluconate Cloth  6 each Topical Q0600  . docusate sodium  100 mg Oral BID  . insulin aspart  0-9 Units Subcutaneous Q4H  . mupirocin ointment  1 application Nasal BID  . QUEtiapine  300 mg Oral QHS  . sertraline  50 mg Oral Daily  . traZODone  150 mg Oral QHS    SUBJECTIVE: Wants to plan to go home with motorized wheelchair. Not  interested in SNF because of his inability to not be independent.   No fevers or chills. No new signs of pain outside of left foot surgical site.    Review of Systems: Review of Systems  Constitutional: Negative for chills and fever.  Respiratory: Negative for cough and shortness of breath.   Cardiovascular: Negative for chest pain and leg swelling.  Musculoskeletal: Positive for joint pain. Negative for back pain and neck pain.  Skin: Negative for itching and rash.    No Known Allergies  OBJECTIVE: Vitals:   10/25/19 0845 10/25/19 0855 10/25/19 0915 10/25/19 0938  BP: 100/61 103/61 123/72 117/73  Pulse: 73 85 80 75  Resp: 11 20 16 18   Temp:    98.6 F (37 C)  TempSrc:      SpO2: 95% 96% 95% 96%  Weight:      Height:       Body mass index is 30.55 kg/m.  Physical Exam Vitals reviewed.  Constitutional:      Appearance: He is well-developed.     Comments: Resting in bed. Seems a little sleepy from sedation with recent TEE but overall very similar mentally compared to my last encounter with him.   HENT:     Mouth/Throat:     Dentition: Normal dentition. No dental  abscesses.  Cardiovascular:     Rate and Rhythm: Normal rate and regular rhythm.     Heart sounds: Normal heart sounds.  Pulmonary:     Effort: Pulmonary effort is normal.     Breath sounds: Normal breath sounds.  Abdominal:     General: There is no distension.     Palpations: Abdomen is soft.     Tenderness: There is no abdominal tenderness.  Musculoskeletal:     Comments: L BKA with wound vac in place. Serosanguinous drainage.   Lymphadenopathy:     Cervical: No cervical adenopathy.  Skin:    General: Skin is warm and dry.     Findings: No rash.  Neurological:     Mental Status: He is alert and oriented to person, place, and time.  Psychiatric:        Judgment: Judgment normal.     Lab Results Lab Results  Component Value Date   WBC 6.7 10/25/2019   HGB 8.9 (L) 10/25/2019   HCT 27.9 (L)  10/25/2019   MCV 94.6 10/25/2019   PLT 214 10/25/2019    Lab Results  Component Value Date   CREATININE 0.83 10/25/2019   BUN 5 (L) 10/25/2019   NA 138 10/25/2019   K 4.0 10/25/2019   CL 107 10/25/2019   CO2 21 (L) 10/25/2019    Lab Results  Component Value Date   ALT 19 10/25/2019   AST 31 10/25/2019   ALKPHOS 185 (H) 10/25/2019   BILITOT 1.1 10/25/2019     Microbiology: Recent Results (from the past 240 hour(s))  Blood culture (routine x 2)     Status: Abnormal   Collection Time: 10/19/19  2:51 PM   Specimen: BLOOD RIGHT FOREARM  Result Value Ref Range Status   Specimen Description   Final    BLOOD RIGHT FOREARM Performed at [facility], [address] [place], [place] [address]    Special Requests   Final    BOTTLES DRAWN AEROBIC AND ANAEROBIC Blood Culture adequate volume Performed at [facility], [address] [place], [place] [address]    Culture  Setup Time   Final    GRAM POSITIVE COCCI IN CLUSTERS IN BOTH AEROBIC AND ANAEROBIC BOTTLES CRITICAL RESULT CALLED TO, READ BACK BY AND VERIFIED WITH: PHARMD Damaris Hippo [id number] AT 1124 BY CM Performed at [facility], [address], [place], [place] [address]    Culture METHICILLIN RESISTANT STAPHYLOCOCCUS AUREUS (A)  Final   Report Status 10/22/2019 FINAL  Final   Organism ID, Bacteria METHICILLIN RESISTANT STAPHYLOCOCCUS AUREUS  Final      Susceptibility   Methicillin resistant staphylococcus aureus - MIC*    CIPROFLOXACIN 4 RESISTANT Resistant     ERYTHROMYCIN >=8 RESISTANT Resistant     GENTAMICIN <=0.5 SENSITIVE Sensitive     OXACILLIN >=4 RESISTANT Resistant     TETRACYCLINE >=16 RESISTANT Resistant     VANCOMYCIN <=0.5 SENSITIVE Sensitive     TRIMETH/SULFA <=10 SENSITIVE Sensitive     CLINDAMYCIN <=0.25 SENSITIVE Sensitive     RIFAMPIN <=0.5 SENSITIVE Sensitive     Inducible Clindamycin NEGATIVE Sensitive     * METHICILLIN RESISTANT STAPHYLOCOCCUS AUREUS  Respiratory  Panel by RT PCR (Flu A&B, Covid) - Nasopharyngeal Swab     Status: None   Collection Time: 10/19/19  8:07 PM   Specimen: Nasopharyngeal Swab  Result Value Ref Range Status   SARS Coronavirus 2 by RT PCR NEGATIVE NEGATIVE Final  Comment: (NOTE) SARS-CoV-2 target nucleic acids are NOT DETECTED. The SARS-CoV-2 RNA is generally detectable in upper respiratoy specimens during the acute phase of infection. The lowest concentration of SARS-CoV-2 viral copies this assay can detect is 131 copies/mL. A negative result does not preclude SARS-Cov-2 infection and should not be used as the sole basis for treatment or other patient management decisions. A negative result may occur with  improper specimen collection/handling, submission of specimen other than nasopharyngeal swab, presence of viral mutation(s) within the areas targeted by this assay, and inadequate number of viral copies (<131 copies/mL). A negative result must be combined with clinical observations, patient history, and epidemiological information. The expected result is Negative. Fact Sheet for Patients:  https://www.moore.com/ Fact Sheet for Healthcare Providers:  https://www.young.biz/ This test is not yet ap proved or cleared by the [place] FDA and  has been authorized for detection and/or diagnosis of SARS-CoV-2 by FDA under an Emergency Use Authorization (EUA). This EUA will remain  in effect (meaning this test can be used) for the duration of the COVID-19 declaration under Section 564(b)(1) of the Act, 21 U.S.C. section 360bbb-3(b)(1), unless the authorization is terminated or revoked sooner.    Influenza A by PCR NEGATIVE NEGATIVE Final   Influenza B by PCR NEGATIVE NEGATIVE Final    Comment: (NOTE) The Xpert Xpress SARS-CoV-2/FLU/RSV assay is intended as an aid in  the diagnosis of influenza from Nasopharyngeal swab specimens and  should not be used as a sole basis for  treatment. Nasal washings and  aspirates are unacceptable for Xpert Xpress SARS-CoV-2/FLU/RSV  testing. Fact Sheet for Patients: https://www.moore.com/ Fact Sheet for Healthcare Providers: https://www.young.biz/ This test is not yet approved or cleared by the [place] FDA and  has been authorized for detection and/or diagnosis of SARS-CoV-2 by  FDA under an Emergency Use Authorization (EUA). This EUA will remain  in effect (meaning this test can be used) for the duration of the  Covid-19 declaration under Section 564(b)(1) of the Act, 21  U.S.C. section 360bbb-3(b)(1), unless the authorization is  terminated or revoked. Performed at [facility], [address], [place], [place] [address]   Blood culture (routine x 2)     Status: None   Collection Time: 10/20/19  5:04 AM   Specimen: BLOOD  Result Value Ref Range Status   Specimen Description   Final    BLOOD LEFT ANTECUBITAL Performed at [facility], [address] [place], [place] [address]    Special Requests   Final    BOTTLES DRAWN AEROBIC ONLY Blood Culture adequate volume Performed at [facility], [address] [place], [place] [address]    Culture   Final    NO GROWTH 5 DAYS Performed at [facility], [address] [place], [place] [address]    Report Status 10/25/2019 FINAL  Final  Culture, blood (Routine X 2) w Reflex to ID Panel     Status: None (Preliminary result)   Collection Time: 10/21/19  4:35 PM   Specimen: BLOOD LEFT HAND  Result Value Ref Range Status   Specimen Description BLOOD LEFT HAND  Final   Special Requests   Final    BOTTLES DRAWN AEROBIC ONLY Blood Culture adequate volume   Culture   Final    NO GROWTH 4 DAYS Performed at [facility], [address] [place], [place] [address]    Report Status PENDING  Incomplete  Culture, blood (Routine X 2)  w Reflex to ID Panel     Status:  None (Preliminary result)   Collection Time: 10/21/19  4:35 PM   Specimen: BLOOD LEFT HAND  Result Value Ref Range Status   Specimen Description BLOOD LEFT HAND  Final   Special Requests   Final    BOTTLES DRAWN AEROBIC AND ANAEROBIC Blood Culture adequate volume   Culture   Final    NO GROWTH 4 DAYS Performed at [facility], [address], [place], [place] [address]    Report Status PENDING  Incomplete  SARS CORONAVIRUS 2 (TAT 6-24 HRS) Nasopharyngeal Nasopharyngeal Swab     Status: None   Collection Time: 10/23/19  1:30 AM   Specimen: Nasopharyngeal Swab  Result Value Ref Range Status   SARS Coronavirus 2 NEGATIVE NEGATIVE Final    Comment: (NOTE) SARS-CoV-2 target nucleic acids are NOT DETECTED. The SARS-CoV-2 RNA is generally detectable in upper and lower respiratory specimens during the acute phase of infection. Negative results do not preclude SARS-CoV-2 infection, do not rule out co-infections with other pathogens, and should not be used as the sole basis for treatment or other patient management decisions. Negative results must be combined with clinical observations, patient history, and epidemiological information. The expected result is Negative. Fact Sheet for Patients: HairSlick.no Fact Sheet for Healthcare Providers: quierodirigir.com This test is not yet approved or cleared by the [place] FDA and  has been authorized for detection and/or diagnosis of SARS-CoV-2 by FDA under an Emergency Use Authorization (EUA). This EUA will remain  in effect (meaning this test can be used) for the duration of the COVID-19 declaration under Section 56 4(b)(1) of the Act, 21 U.S.C. section 360bbb-3(b)(1), unless the authorization is terminated or revoked sooner. Performed at [facility], [address], [place], [place] [address]   Surgical pcr screen     Status: Abnormal   Collection Time: 10/23/19  3:27  AM   Specimen: Nasal Mucosa; Nasal Swab  Result Value Ref Range Status   MRSA, PCR POSITIVE (A) NEGATIVE Final    Comment: RESULT CALLED TO, READ BACK BY AND VERIFIED WITH: COLLIE,L RN [phone] 10/23/2019 MITCHELL,L    Staphylococcus aureus POSITIVE (A) NEGATIVE Final    Comment: (NOTE) The Xpert SA Assay (FDA approved for NASAL specimens in patients 15 years of age and older), is one component of a comprehensive surveillance program. It is not intended to diagnose infection nor to guide or monitor treatment.     Rexene Alberts, MSN, NP-C [facility] for Infectious Disease [facility]  [email or web address] Pager: [phone] Office: [phone] [facility] Main Line: [phone]

## 2019-10-25 NOTE — Transfer of Care (Signed)
Immediate Anesthesia Transfer of Care Note  Patient: Cory Palmer  Procedure(s) Performed: TRANSESOPHAGEAL ECHOCARDIOGRAM (TEE) (N/A )  Patient Location: Endoscopy Unit  Anesthesia Type:MAC  Level of Consciousness: drowsy  Airway & Oxygen Therapy: Patient Spontanous Breathing  Post-op Assessment: Report given to RN and Post -op Vital signs reviewed and stable  Post vital signs: Reviewed and stable  Last Vitals:  Vitals Value Taken Time  BP    Temp    Pulse 68 10/25/19 0825  Resp 11 10/25/19 0825  SpO2 97 % 10/25/19 0825  Vitals shown include unvalidated device data.  Last Pain:  Vitals:   10/25/19 0734  TempSrc: Oral  PainSc: 0-No pain      Patients Stated Pain Goal: 0 (99/77/41 4239)  Complications: No apparent anesthesia complications

## 2019-10-25 NOTE — Progress Notes (Signed)
PT Cancellation Note  Patient Details Name: Cory Palmer MRN: [id number] DOB: 1961-04-13   Cancelled Treatment:    Reason Eval/Treat Not Completed: Patient at procedure or test/unavailable. PT will continue to f/u with pt acutely as available.    Alessandra Bevels [name] 10/25/2019, 7:33 AM

## 2019-10-25 NOTE — Plan of Care (Signed)
  Problem: Education: Goal: Knowledge of General Education information will improve Description: Including pain rating scale, medication(s)/side effects and non-pharmacologic comfort measures Outcome: Adequate for Discharge   

## 2019-10-25 NOTE — Progress Notes (Signed)
    Transesophageal Echocardiogram Note  TALI COSTER [id number] 1960-12-25  Procedure: Transesophageal Echocardiogram Indications: Bacteremia  Procedure Details Consent: Obtained Time Out: Verified patient identification, verified procedure, site/side was marked, verified correct patient position, special equipment/implants available, Radiology Safety Procedures followed,  medications/allergies/relevent history reviewed, required imaging and test results available.  Performed  Medications:  Pt sedated by anesthesia with precedex 16 mg and  diprovan 200 mg IV total.  Normal LV function; moderate LAE; no LAA thrombus; trace AI; mild MR and TR; no vegetations.   Complications: No apparent complications Patient did tolerate procedure well.  Olga Millers, MD

## 2019-10-26 ENCOUNTER — Inpatient Hospital Stay: Payer: Self-pay

## 2019-10-26 LAB — CBC WITH DIFFERENTIAL/PLATELET
Abs Immature Granulocytes: 0.08 10*3/uL — ABNORMAL HIGH (ref 0.00–0.07)
Basophils Absolute: 0.1 10*3/uL (ref 0.0–0.1)
Basophils Relative: 1 %
Eosinophils Absolute: 0.4 10*3/uL (ref 0.0–0.5)
Eosinophils Relative: 5 %
HCT: 27.8 % — ABNORMAL LOW (ref 39.0–52.0)
Hemoglobin: 8.8 g/dL — ABNORMAL LOW (ref 13.0–17.0)
Immature Granulocytes: 1 %
Lymphocytes Relative: 17 %
Lymphs Abs: 1.3 10*3/uL (ref 0.7–4.0)
MCH: 30.1 pg (ref 26.0–34.0)
MCHC: 31.7 g/dL (ref 30.0–36.0)
MCV: 95.2 fL (ref 80.0–100.0)
Monocytes Absolute: 0.6 10*3/uL (ref 0.1–1.0)
Monocytes Relative: 7 %
Neutro Abs: 5.2 10*3/uL (ref 1.7–7.7)
Neutrophils Relative %: 69 %
Platelets: 227 10*3/uL (ref 150–400)
RBC: 2.92 MIL/uL — ABNORMAL LOW (ref 4.22–5.81)
RDW: 15.2 % (ref 11.5–15.5)
WBC: 7.6 10*3/uL (ref 4.0–10.5)
nRBC: 0 % (ref 0.0–0.2)

## 2019-10-26 LAB — COMPREHENSIVE METABOLIC PANEL
ALT: 16 U/L (ref 0–44)
AST: 25 U/L (ref 15–41)
Albumin: 2 g/dL — ABNORMAL LOW (ref 3.5–5.0)
Alkaline Phosphatase: 157 U/L — ABNORMAL HIGH (ref 38–126)
Anion gap: 9 (ref 5–15)
BUN: 6 mg/dL (ref 6–20)
CO2: 21 mmol/L — ABNORMAL LOW (ref 22–32)
Calcium: 8.1 mg/dL — ABNORMAL LOW (ref 8.9–10.3)
Chloride: 107 mmol/L (ref 98–111)
Creatinine, Ser: 0.65 mg/dL (ref 0.61–1.24)
GFR calc Af Amer: >60 mL/min (ref >60)
GFR calc non Af Amer: >60 mL/min (ref >60)
Glucose, Bld: 142 mg/dL — ABNORMAL HIGH (ref 70–99)
Potassium: 4.1 mmol/L (ref 3.5–5.1)
Sodium: 137 mmol/L (ref 135–145)
Total Bilirubin: 1.2 mg/dL (ref 0.3–1.2)
Total Protein: 6.7 g/dL (ref 6.5–8.1)

## 2019-10-26 LAB — CULTURE, BLOOD (ROUTINE X 2)
Culture: NO GROWTH
Culture: NO GROWTH
Special Requests: ADEQUATE
Special Requests: ADEQUATE

## 2019-10-26 LAB — PHOSPHORUS: Phosphorus: 3.4 mg/dL (ref 2.5–4.6)

## 2019-10-26 LAB — VANCOMYCIN, PEAK
Vancomycin Pk: 26 ug/mL — ABNORMAL LOW (ref 30–40)
Vancomycin Pk: 23 ug/mL — ABNORMAL LOW (ref 30–40)

## 2019-10-26 LAB — GLUCOSE, CAPILLARY
Glucose-Capillary: 120 mg/dL — ABNORMAL HIGH (ref 70–99)
Glucose-Capillary: 119 mg/dL — ABNORMAL HIGH (ref 70–99)
Glucose-Capillary: 189 mg/dL — ABNORMAL HIGH (ref 70–99)
Glucose-Capillary: 143 mg/dL — ABNORMAL HIGH (ref 70–99)
Glucose-Capillary: 112 mg/dL — ABNORMAL HIGH (ref 70–99)
Glucose-Capillary: 110 mg/dL — ABNORMAL HIGH (ref 70–99)

## 2019-10-26 LAB — MAGNESIUM: Magnesium: 1.9 mg/dL (ref 1.7–2.4)

## 2019-10-26 MED ORDER — FERROUS SULFATE 325 (65 FE) MG PO TABS
325.0000 mg | ORAL_TABLET | Freq: Every day | ORAL | Status: DC
  Administered 2019-10-27 – 2019-11-06 (×11): 325 mg via ORAL
  Filled 2019-10-26 (×11): qty 1

## 2019-10-26 MED ORDER — INSULIN ASPART 100 UNIT/ML ~~LOC~~ SOLN
0.0000 [IU] | Freq: Three times a day (TID) | SUBCUTANEOUS | Status: DC
  Administered 2019-10-27: 17:00:00 2 [IU] via SUBCUTANEOUS
  Administered 2019-10-28 – 2019-10-30 (×6): 1 [IU] via SUBCUTANEOUS
  Administered 2019-10-31: 2 [IU] via SUBCUTANEOUS
  Administered 2019-10-31 – 2019-11-05 (×9): 1 [IU] via SUBCUTANEOUS

## 2019-10-26 MED ORDER — INSULIN ASPART 100 UNIT/ML ~~LOC~~ SOLN: 0.0000 [IU] | Freq: Every day | SUBCUTANEOUS | Status: DC

## 2019-10-26 MED ORDER — VANCOMYCIN HCL 1250 MG/250ML IV SOLN
1250.0000 mg | Freq: Two times a day (BID) | INTRAVENOUS | Status: DC
  Administered 2019-10-26 – 2019-11-03 (×15): 1250 mg via INTRAVENOUS
  Filled 2019-10-26 (×16): qty 250

## 2019-10-26 NOTE — Progress Notes (Signed)
PROGRESS NOTE    Cory Palmer  UQJ:[id number] DOB: 25-Feb-1961 DOA: 10/22/2019 PCP: Patient, [name] Per   Brief Narrative: Cory Palmer is Cory Palmer 59 y.o. male with medical history significant for bipolar disorder, liver cirrhosis, type 2 diabetes mellitus, recent admission with osteomyelitis, MRSA bacteremia with left foot with suspected source, and was planned for surgery left AMA on 10/21/2019 now returns requesting resumption of his care.  Patient reports that he was able to attend to some business that had required to leave AMA, is now dedicated to treatment of his infection, and states that his brother is planning to come from out of town to assist him in his recovery.  Patient denies any fevers or chills, has not been taking any medications since leaving the hospital, and denies any chest pain, abdominal pain, cough, or shortness of breath.  He continues to have some bloody and purulent drainage from the left foot.  ED Course: Upon arrival to the ED, patient is found to be afebrile, saturating, stable blood pressure.  Chemistry panel with sodium 132 her bicarbonate is 19.  CBC with leukocytosis to 10,900 and stable normocytic anemia with hemoglobin 8.5.  Covid PCR screening test has been ordered hospitalists consulted for admission.  Assessment & Plan:   Principal Problem:   MRSA bacteremia Active Problems:   Diabetes mellitus type II, non insulin dependent (HCC)   S/P transmetatarsal amputation of foot, left (HCC)   Depressed bipolar disorder (HCC)   PAF (paroxysmal atrial fibrillation) (HCC)   Osteomyelitis of left foot (HCC)   Hyponatremia  1. MRSA bacteremia; left foot osteomyelitis  - Patient was admitted with worsening foot wound on 3/20 - plain films concerning for osteomyelitis - blood culture from 3/20 growing MRSA - repeat blood cx 3/22 NGTD  - echo 3/22 -> EF 60-65% - evidence of atrial level shunting.   - TEE 3/26 without vegetations - Continue vancomycin.  ID  recommending 12 more days from 3/26.  Therapy will be completed on April 7th.  Unasyn now d/c'd after L BKA. - ID c/s, appreciate recs - Ortho c/s, s/p transtibial amputation on 3/24 - recommends f/u in 1 week - PICC today, pt refused previously  2. Paroxysmal atrial fibrillation  - Not anticoagulated prior to admission, CHADS-VASc may only be 1 (DM), but also poor candidate due to chronic anemia and poor adherence   3. Type II DM  - A1c was 6,8% earlier this month  - Check CBGs and use Vietta Bonifield low-intensity SSI for now    4. Hyponatremia  - Serum sodium is 132 in ED, similar to priors, appears euvolemic, likely related to cirrhosis, will monitor    # Hypomagnesemia: replace and follow  5. Bipolar disorder  - Continue Seroquel, Zoloft, trazodone    6. Anemia  Iron Def Anemia - Trend H/H - stable - iron, b12, folate, ferritin - suggestive of iron def anemia - Patient denies melena or hematochezia; likely related to cirrhosis, chronic infection  - Needs f/u for IDA, CRC screening if not done  7. Cirrhosis  - Appears compensated   DVT prophylaxis: SCD Code Status: full Family Communication: brother at bedside Disposition Plan:  . Patient came from: home            . Anticipated d/c place:home . Barriers to d/c OR conditions which need to be met to effect [name] safe d/c: pending TEE, clearance by ortho   Consultants:   orthopedics  Procedures:  3/24 - s/p L transtibial  amputation  Echo IMPRESSIONS    1. Normal LV function; trace AI; mild MR and TR; moderate LAE; no  vegetatioins.  2. Left ventricular ejection fraction, by estimation, is 55 to 60%. The  left ventricle has normal function. The left ventricle has no regional  wall motion abnormalities.  3. Right ventricular systolic function is normal. The right ventricular  size is normal.  4. Left atrial size was moderately dilated. No left atrial/left atrial  appendage thrombus was detected.  5. The mitral  valve is normal in structure. Mild mitral valve  regurgitation.  6. The aortic valve is tricuspid. Aortic valve regurgitation is trivial.  Mild aortic valve sclerosis is present, with no evidence of aortic valve  stenosis.  7. There is mild (Grade II) plaque involving the descending aorta.   Antimicrobials:  Anti-infectives (From admission, onward)   Start     Dose/Rate Route Frequency Ordered Stop   10/23/19 1400  vancomycin (VANCOREADY) IVPB 1250 mg/250 mL     1,250 mg 166.7 mL/hr over 90 Minutes Intravenous Every 12 hours 10/23/19 0129     10/23/19 1030  ceFAZolin (ANCEF) IVPB 2g/100 mL premix  Status:  Discontinued     2 g 200 mL/hr over 30 Minutes Intravenous On call to O.R. 10/23/19 1026 10/23/19 1412   10/23/19 1000  Ampicillin-Sulbactam (UNASYN) 3 g in sodium chloride 0.9 % 100 mL IVPB  Status:  Discontinued     3 g 200 mL/hr over 30 Minutes Intravenous Every 6 hours 10/23/19 0858 10/24/19 1239   10/23/19 0130  vancomycin (VANCOREADY) IVPB 2000 mg/400 mL     2,000 mg 200 mL/hr over 120 Minutes Intravenous  Once 10/23/19 0128 10/23/19 0630     Subjective: No new complaints Willing to consider SNF placement  Objective: Vitals:   10/25/19 1931 10/26/19 0237 10/26/19 0747 10/26/19 1234  BP: 112/63 121/62 134/70 104/63  Pulse: 63 67 64 64  Resp: 17 15 18 18   Temp: 98.1 F (36.7 C) 99 F (37.2 C) 98.1 F (36.7 C) 97.8 F (36.6 C)  TempSrc: Oral Oral Oral Oral  SpO2: 92% 95% 96% 94%  Weight:      Height:        Intake/Output Summary (Last 24 hours) at 10/26/2019 1309 Last data filed at 10/26/2019 1159 Gross per 24 hour  Intake 480 ml  Output 2075 ml  Net -1595 ml   Filed Weights   10/22/19 2329 10/25/19 0734  Weight: 113.9 kg 113.9 kg    Examination:  General: No acute distress. Cardiovascular: Heart sounds show Noreta Kue regular rate, and rhythm. Lungs: Clear to auscultation bilaterally Abdomen: Soft, nontender, nondistended  Neurological: Alert and oriented  3. Moves all extremities 4. Cranial nerves II through XII grossly intact. Skin: Warm and dry. No rashes or lesions. Extremities: L BKA with wound vac    Data Reviewed: I have personally reviewed following labs and imaging studies  CBC: Recent Labs  Lab 10/19/19 1451 10/20/19 0504 10/22/19 1924 10/22/19 1924 10/23/19 0532 10/23/19 1638 10/24/19 0440 10/25/19 0403 10/26/19 0521  WBC 13.8*   < > 10.9*  --  7.6  --  5.6 6.7 7.6  NEUTROABS 11.4*  --   --   --  5.1  --  3.7  --  5.2  HGB 9.4*   < > 8.5*   < > 7.7* 7.8* 7.8* 8.9* 8.8*  HCT 29.0*   < > 25.9*   < > 23.1* 24.7* 24.5* 27.9* 27.8*  MCV 94.2   < >  93.2  --  93.1  --  93.9 94.6 95.2  PLT 278   < > 243  --  201  --  196 214 227   < > = values in this interval not displayed.   Basic Metabolic Panel: Recent Labs  Lab 10/21/19 0228 10/21/19 0228 10/22/19 1924 10/23/19 0532 10/24/19 0440 10/25/19 0403 10/26/19 0521  NA 130*   < > 132* 138 136 138 137  K 3.6   < > 3.7 3.4* 3.8 4.0 4.1  CL 103   < > 102 108 107 107 107  CO2 19*   < > 19* 22 21* 21* 21*  GLUCOSE 180*   < > 155* 115* 119* 163* 142*  BUN 6   < > 8 6 <5* 5* 6  CREATININE 0.66   < > 0.67 0.64 0.63 0.83 0.65  CALCIUM 7.6*   < > 8.2* 8.0* 7.6* 7.9* 8.1*  MG 1.6*  --   --  1.7 1.6* 1.6* 1.9  PHOS  --   --   --   --  3.1 3.3 3.4   < > = values in this interval not displayed.   GFR: Estimated Creatinine Clearance: 137.3 mL/min (by C-G formula based on SCr of 0.65 mg/dL). Liver Function Tests: Recent Labs  Lab 10/20/19 0504 10/23/19 0532 10/24/19 0440 10/25/19 0403 10/26/19 0521  AST 24 31 37 31 25  ALT 16 17 18 19 16   ALKPHOS 170* 184* 178* 185* 157*  BILITOT 2.1* 1.3* 1.4* 1.1 1.2  PROT 6.3* 6.2* 6.4* 6.7 6.7  ALBUMIN 2.1* 1.9* 1.8* 2.0* 2.0*   Recent Labs  Lab 10/19/19 1657  LIPASE 25   No results for input(s): AMMONIA in the last 168 hours. Coagulation Profile: Recent Labs  Lab 10/23/19 0532  INR 1.4*   Cardiac Enzymes: No  results for input(s): CKTOTAL, CKMB, CKMBINDEX, TROPONINI in the last 168 hours. BNP (last 3 results) No results for input(s): PROBNP in the last 8760 hours. HbA1C: No results for input(s): HGBA1C in the last 72 hours. CBG: Recent Labs  Lab 10/25/19 1932 10/25/19 2347 10/26/19 0421 10/26/19 0746 10/26/19 1138  GLUCAP 166* 148* 120* 119* 189*   Lipid Profile: No results for input(s): CHOL, HDL, LDLCALC, TRIG, CHOLHDL, LDLDIRECT in the last 72 hours. Thyroid Function Tests: No results for input(s): TSH, T4TOTAL, FREET4, T3FREE, THYROIDAB in the last 72 hours. Anemia Panel: Recent Labs    10/23/19 1638  VITAMINB12 950*  FOLATE 23.4  FERRITIN 140  TIBC 179*  IRON 22*   Sepsis Labs: Recent Labs  Lab 10/19/19 1451 10/19/19 1804  LATICACIDVEN 4.5* 2.5*    Recent Results (from the past 240 hour(s))  Blood culture (routine x 2)     Status: Abnormal   Collection Time: 10/19/19  2:51 PM   Specimen: BLOOD RIGHT FOREARM  Result Value Ref Range Status   Specimen Description   Final    BLOOD RIGHT FOREARM Performed at [facility], [place] [address] [place], [place] [address]    Special Requests   Final    BOTTLES DRAWN AEROBIC AND ANAEROBIC Blood Culture adequate volume Performed at [facility] [address], [place], [place] [address]    Culture  Setup Time   Final    GRAM POSITIVE COCCI IN CLUSTERS IN BOTH AEROBIC AND ANAEROBIC BOTTLES CRITICAL RESULT CALLED TO, READ BACK BY AND VERIFIED WITH: Irwin Brakeman [id number] AT 7681 BY CM Performed at [facility],  [address], [place], [place] [address]    Culture METHICILLIN RESISTANT STAPHYLOCOCCUS AUREUS (Kazi Montoro)  Final   Report Status 10/22/2019 FINAL  Final   Organism ID, Bacteria METHICILLIN RESISTANT STAPHYLOCOCCUS AUREUS  Final      Susceptibility   Methicillin resistant staphylococcus aureus - MIC*    CIPROFLOXACIN 4 RESISTANT Resistant     ERYTHROMYCIN >=8 RESISTANT Resistant      GENTAMICIN <=0.5 SENSITIVE Sensitive     OXACILLIN >=4 RESISTANT Resistant     TETRACYCLINE >=16 RESISTANT Resistant     VANCOMYCIN <=0.5 SENSITIVE Sensitive     TRIMETH/SULFA <=10 SENSITIVE Sensitive     CLINDAMYCIN <=0.25 SENSITIVE Sensitive     RIFAMPIN <=0.5 SENSITIVE Sensitive     Inducible Clindamycin NEGATIVE Sensitive     * METHICILLIN RESISTANT STAPHYLOCOCCUS AUREUS  Respiratory Panel by RT PCR (Flu Savanah Bayles&B, Covid) - Nasopharyngeal Swab     Status: None   Collection Time: 10/19/19  8:07 PM   Specimen: Nasopharyngeal Swab  Result Value Ref Range Status   SARS Coronavirus 2 by RT PCR NEGATIVE NEGATIVE Final    Comment: (NOTE) SARS-CoV-2 target nucleic acids are NOT DETECTED. The SARS-CoV-2 RNA is generally detectable in upper respiratoy specimens during the acute phase of infection. The lowest concentration of SARS-CoV-2 viral copies this assay can detect is 131 copies/mL. Supreme Rybarczyk negative result does not preclude SARS-Cov-2 infection and should not be used as the sole basis for treatment or other patient management decisions. Likisha Alles negative result may occur with  improper specimen collection/handling, submission of specimen other than nasopharyngeal swab, presence of viral mutation(s) within the areas targeted by this assay, and inadequate number of viral copies (<131 copies/mL). Corrion Stirewalt negative result must be combined with clinical observations, patient history, and epidemiological information. The expected result is Negative. Fact Sheet for Patients:  PinkCheek.be Fact Sheet for Healthcare Providers:  GravelBags.it This test is not yet ap proved or cleared by the [place] FDA and  has been authorized for detection and/or diagnosis of SARS-CoV-2 by FDA under an Emergency Use Authorization (EUA). This EUA will remain  in effect (meaning this test can be used) for the duration of the COVID-19 declaration under Section 564(b)(1) of  the Act, 21 U.S.C. section 360bbb-3(b)(1), unless the authorization is terminated or revoked sooner.    Influenza Darnice Comrie by PCR NEGATIVE NEGATIVE Final   Influenza B by PCR NEGATIVE NEGATIVE Final    Comment: (NOTE) The Xpert Xpress SARS-CoV-2/FLU/RSV assay is intended as an aid in  the diagnosis of influenza from Nasopharyngeal swab specimens and  should not be used as Champ Keetch sole basis for treatment. Nasal washings and  aspirates are unacceptable for Xpert Xpress SARS-CoV-2/FLU/RSV  testing. Fact Sheet for Patients: PinkCheek.be Fact Sheet for Healthcare Providers: GravelBags.it This test is not yet approved or cleared by the [place] FDA and  has been authorized for detection and/or diagnosis of SARS-CoV-2 by  FDA under an Emergency Use Authorization (EUA). This EUA will remain  in effect (meaning this test can be used) for the duration of the  Covid-19 declaration under Section 564(b)(1) of the Act, 21  U.S.C. section 360bbb-3(b)(1), unless the authorization is  terminated or revoked. Performed at [facility], [place] [address], [place],  [address]   Blood culture (routine x 2)     Status: None   Collection Time: 10/20/19  5:04 AM   Specimen: BLOOD  Result Value Ref Range Status   Specimen Description   Final  BLOOD LEFT ANTECUBITAL Performed at [facility] [address] [place], [place] [address]    Special Requests   Final    BOTTLES DRAWN AEROBIC ONLY Blood Culture adequate volume Performed at [facility] [address], [place], [place] [address]    Culture   Final    NO GROWTH 5 DAYS Performed at [facility], [place] [address], [place], [place] [address]    Report Status 10/25/2019 FINAL  Final  Culture, blood (Routine X 2) w Reflex to ID Panel     Status: None (Preliminary result)   Collection Time: 10/21/19  4:35 PM   Specimen: BLOOD  LEFT HAND  Result Value Ref Range Status   Specimen Description BLOOD LEFT HAND  Final   Special Requests   Final    BOTTLES DRAWN AEROBIC ONLY Blood Culture adequate volume   Culture   Final    NO GROWTH 4 DAYS Performed at [facility], [place] [address], [place], [place] [address]    Report Status PENDING  Incomplete  Culture, blood (Routine X 2) w Reflex to ID Panel     Status: None (Preliminary result)   Collection Time: 10/21/19  4:35 PM   Specimen: BLOOD LEFT HAND  Result Value Ref Range Status   Specimen Description BLOOD LEFT HAND  Final   Special Requests   Final    BOTTLES DRAWN AEROBIC AND ANAEROBIC Blood Culture adequate volume   Culture   Final    NO GROWTH 4 DAYS Performed at [facility], [place] [address] [place], [place] [address]    Report Status PENDING  Incomplete  SARS CORONAVIRUS 2 (TAT 6-24 HRS) Nasopharyngeal Nasopharyngeal Swab     Status: None   Collection Time: 10/23/19  1:30 AM   Specimen: Nasopharyngeal Swab  Result Value Ref Range Status   SARS Coronavirus 2 NEGATIVE NEGATIVE Final    Comment: (NOTE) SARS-CoV-2 target nucleic acids are NOT DETECTED. The SARS-CoV-2 RNA is generally detectable in upper and lower respiratory specimens during the acute phase of infection. Negative results do not preclude SARS-CoV-2 infection, do not rule out co-infections with other pathogens, and should not be used as the sole basis for treatment or other patient management decisions. Negative results must be combined with clinical observations, patient history, and epidemiological information. The expected result is Negative. Fact Sheet for Patients: SugarRoll.be Fact Sheet for Healthcare Providers: https://www.woods-mathews.com/ This test is not yet approved or cleared by the [place] FDA and  has been authorized for detection and/or diagnosis of SARS-CoV-2 by FDA under an Emergency Use Authorization (EUA).  This EUA will remain  in effect (meaning this test can be used) for the duration of the COVID-19 declaration under Section 56 4(b)(1) of the Act, 21 U.S.C. section 360bbb-3(b)(1), unless the authorization is terminated or revoked sooner. Performed at [facility], [place] [address] [place], [place] [address]   Surgical pcr screen     Status: Abnormal   Collection Time: 10/23/19  3:27 AM   Specimen: Nasal Mucosa; Nasal Swab  Result Value Ref Range Status   MRSA, PCR POSITIVE (Donterrius Santucci) NEGATIVE Final    Comment: RESULT CALLED TO, READ BACK BY AND VERIFIED WITH: COLLIE,L RN [phone] 10/23/2019 MITCHELL,L    Staphylococcus aureus POSITIVE (Lener Ventresca) NEGATIVE Final    Comment: (NOTE) The Xpert SA Assay (FDA approved for NASAL specimens in patients 55 years of age and older), is one component of Esgar Barnick comprehensive surveillance  program. It is not intended to diagnose infection nor to guide or monitor treatment.          Radiology Studies: ECHO TEE  Result Date: 10/25/2019    TRANSESOPHOGEAL ECHO REPORT   Patient Name:   KALONJI ZURAWSKI Date of Exam: 10/25/2019 Medical Rec #:  [id number]     Height:       76.0 in Accession #:    [id number]    Weight:       251.0 lb Date of Birth:  1961-04-21    BSA:          2.440 m Patient Age:    80 years      BP:           105/73 mmHg Patient Gender: M             HR:           83 bpm. Exam Location:  Inpatient Procedure: Transesophageal Echo Indications:     Bacteremia 790.7/R78.81  History:         Patient has prior history of Echocardiogram examinations, most                  recent 10/21/2019. Arrythmias:Atrial Fibrillation; Risk                  Factors:Diabetes and Current Smoker. Osteomyelitis. MRSA                  bacteremia.  Sonographer:     Clayton Lefort RDCS (AE) Referring Phys:  [id number] [place] Diagnosing Phys: Kirk Ruths MD PROCEDURE: After discussion of the risks and benefits of Trina Asch TEE, an informed consent was obtained from the patient. The  transesophogeal probe was passed without difficulty through the esophogus of the patient. Sedation performed by different physician. The patient was monitored while under deep sedation. Anesthestetic sedation was provided intravenously by Anesthesiology: 205.7m of Propofol. Image quality was adequate. The patient developed no complications during the procedure. Pt sedated by anesthesia with precedex 16 mg and diprovan 200 mg IV total. IMPRESSIONS  1. Normal LV function; trace AI; mild MR and TR; moderate LAE; no vegetatioins.  2. Left ventricular ejection fraction, by estimation, is 55 to 60%. The left ventricle has normal function. The left ventricle has no regional wall motion abnormalities.  3. Right ventricular systolic function is normal. The right ventricular size is normal.  4. Left atrial size was moderately dilated. No left atrial/left atrial appendage thrombus was detected.  5. The mitral valve is normal in structure. Mild mitral valve regurgitation.  6. The aortic valve is tricuspid. Aortic valve regurgitation is trivial. Mild aortic valve sclerosis is present, with no evidence of aortic valve stenosis.  7. There is mild (Grade II) plaque involving the descending aorta. FINDINGS  Left Ventricle: Left ventricular ejection fraction, by estimation, is 55 to 60%. The left ventricle has normal function. The left ventricle has no regional wall motion abnormalities. The left ventricular internal cavity size was normal in size. There is  no left ventricular hypertrophy. Right Ventricle: The right ventricular size is normal. Right ventricular systolic function is normal. Left Atrium: Left atrial size was moderately dilated. No left atrial/left atrial appendage thrombus was detected. Right Atrium: Right atrial size was normal in size. Pericardium: There is no evidence of pericardial effusion. Mitral Valve: The mitral valve is normal in structure. There is mild thickening of the mitral valve leaflet(s). Mild  mitral valve regurgitation. Tricuspid Valve: The  tricuspid valve is normal in structure. Tricuspid valve regurgitation is mild. Aortic Valve: The aortic valve is tricuspid. Aortic valve regurgitation is trivial. Mild aortic valve sclerosis is present, with no evidence of aortic valve stenosis. Pulmonic Valve: The pulmonic valve was grossly normal. Pulmonic valve regurgitation is not visualized. Aorta: The aortic root is normal in size and structure. There is mild (Grade II) plaque involving the descending aorta. IAS/Shunts: The interatrial septum appears to be lipomatous. No atrial level shunt detected by color flow Doppler. Additional Comments: Normal LV function; trace AI; mild MR and TR; moderate LAE; no vegetatioins.   AORTA Ao Root diam: 3.00 cm Kirk Ruths MD Electronically signed by Kirk Ruths MD Signature Date/Time: 10/25/2019/8:52:28 AM    Final    [place] EKG SITE RITE  Result Date: 10/26/2019 If Site Rite image not attached, placement could not be confirmed due to current cardiac rhythm.       Scheduled Meds: . Chlorhexidine Gluconate Cloth  6 each Topical Q0600  . docusate sodium  100 mg Oral BID  . insulin aspart  0-9 Units Subcutaneous Q4H  . mupirocin ointment  1 application Nasal BID  . QUEtiapine  300 mg Oral QHS  . sertraline  50 mg Oral Daily  . traZODone  150 mg Oral QHS   Continuous Infusions: . sodium chloride 75 mL/hr at 10/25/19 0740  . lactated ringers 500 mL/hr at 10/23/19 1329  . vancomycin 1,250 mg (10/26/19 1210)     LOS: 3 days    Time spent: over 30 min    Fayrene Helper, MD [facility]   To contact the attending provider between 7A-7P or the covering provider during after hours 7P-7A, please log into the web site [email or web address] and access using universal [place] password for that web site. If you do not have the password, please call the hospital operator.  10/26/2019, 1:09 PM

## 2019-10-26 NOTE — Progress Notes (Signed)
Spoke with Winifred Olive RN re PICC order.  States pt is aware he needs PICC line placed and is agreeable.  Notified that plan to place PICC today or 10/27/19

## 2019-10-26 NOTE — Progress Notes (Signed)
Secure chat with Pharmacist re placement of PICC to be done 10/27/19 after vanc levels have been evaluated.

## 2019-10-26 NOTE — Plan of Care (Signed)
  Problem: Pain Managment: Goal: General experience of comfort will improve Outcome: Progressing   

## 2019-10-26 NOTE — TOC Initial Note (Signed)
Transition of Care [facility]) - Initial/Assessment Note    Patient Details  Name: Cory Palmer MRN: [id number] Date of Birth: 07/14/1961  Transition [facility]) CM/SW Contact:    Bary Castilla, LCSW Phone Number: [phone] 10/26/2019, 2:49 PM  Clinical Narrative:                  CSW met with patient to discuss PT recommendation of a SNF. Patient was aware of recommendation and is refusing SNF at this time. Patient stated that he has been in a SNF before and it was not an option. Patient is agreeable to [facility] and inquired about an electric wheel chair.CSW inquired about a HH preference and he did not have one. Patient gave CSW permission to contact local [place] agencies.  TOC team will continue to follow for discharge planning needs.     Expected Discharge Plan: [place] Barriers to Discharge: Continued Medical Work up   Patient Goals and CMS Choice        Expected Discharge Plan and Services Expected Discharge Plan: [place]       Living arrangements for the past 2 months: Apartment                                      Prior Living Arrangements/Services Living arrangements for the past 2 months: Apartment Lives with:: Self Patient language and need for interpreter reviewed:: Yes Do you feel safe going back to the place where you live?: Yes      Need for Family Participation in Patient Care: Yes (Comment) Care giver support system in place?: No (comment)      Activities of Daily Living Home Assistive Devices/Equipment: None ADL Screening (condition at time of admission) Patient's cognitive ability adequate to safely complete daily activities?: No Is the patient deaf or have difficulty hearing?: No Does the patient have difficulty seeing, even when wearing glasses/contacts?: No Does the patient have difficulty concentrating, remembering, or making decisions?: No Patient able to express need for assistance with ADLs?:  Yes Does the patient have difficulty dressing or bathing?: No Independently performs ADLs?: No Does the patient have difficulty walking or climbing stairs?: Yes Weakness of Legs: Both Weakness of Arms/Hands: None  Permission Sought/Granted   Permission granted to share information with : Yes, Release of Information Signed  Share Information with NAME: Pilar Plate  Permission granted to share info w AGENCY: [place] granted to share info w Relationship: Brother  Permission granted to share info w Contact Information: [phone]  Emotional Assessment Appearance:: Appears stated age Attitude/Demeanor/Rapport: Engaged Affect (typically observed): Accepting, Adaptable Orientation: : Oriented to Self, Oriented to Place, Oriented to  Time, Oriented to Situation      Admission diagnosis:  Staphylococcus aureus bacteremia [R78.81, B95.61] Osteomyelitis of left foot, unspecified type [facility]) [M86.9] Patient Active Problem List   Diagnosis Date Noted  . Hyponatremia 10/23/2019  . MRSA bacteremia 10/21/2019  . Atrial flutter with rapid ventricular response ([place]) 10/21/2019  . Lactic acidosis 10/21/2019  . Severe protein-calorie malnutrition ([place])   . Diabetic polyneuropathy associated with type 2 diabetes mellitus ([place])   . Osteomyelitis of left foot ([place]) 10/19/2019  . Cirrhosis of liver ([place]) 10/19/2019  . Anemia 10/19/2019  . Medication monitoring encounter 08/15/2019  . Serum total bilirubin elevated 10/29/2018  . BPH (benign prostatic hyperplasia) 10/29/2018  .  Non-pressure chronic ulcer of other part of left foot limited to breakdown of skin ([place])   . Below-knee amputation of right lower extremity ([place])   . Hx of BKA, right ([place])   . PAF (paroxysmal atrial fibrillation) ([place])   . Hypokalemia   . Hypomagnesemia   . Sepsis without acute organ dysfunction ([place]) 10/05/2018  . Depressed bipolar disorder ([place]) 10/05/2018  . Hypertension 10/05/2018  . Hyperbilirubinemia 10/05/2018   . S/P transmetatarsal amputation of foot, left ([place]) 09/28/2018  . Wound infection 05/07/2018  . Homelessness 05/07/2018  . Diabetes mellitus type II, non insulin dependent ([place]) 05/07/2018   PCP:  Patient, [name] Per Pharmacy:   [facility] [place], [place] [address] [place] [address] Phone: [phone] Fax: [phone]  [facility] [phone] - [place], [place] - [place] [address] [place] [address] Phone: [phone] Fax: [place], [place] [address] [place] [address] [place] [address] Phone: [phone] Fax: [phone]     Social Determinants of Health ([place]) Interventions    Readmission Risk Interventions Readmission Risk Prevention Plan 10/21/2019  Transportation Screening Complete  PCP or Specialist Appt within 5-7 Days Complete  Home Care Screening Complete  Medication Review (RN CM) Complete  Some recent data might be hidden

## 2019-10-26 NOTE — Plan of Care (Signed)

## 2019-10-27 LAB — CBC WITH DIFFERENTIAL/PLATELET
Abs Immature Granulocytes: 0.07 10*3/uL (ref 0.00–0.07)
Basophils Absolute: 0 10*3/uL (ref 0.0–0.1)
Basophils Relative: 1 %
Eosinophils Absolute: 0.4 10*3/uL (ref 0.0–0.5)
Eosinophils Relative: 5 %
HCT: 30.5 % — ABNORMAL LOW (ref 39.0–52.0)
Hemoglobin: 9.7 g/dL — ABNORMAL LOW (ref 13.0–17.0)
Immature Granulocytes: 1 %
Lymphocytes Relative: 21 %
Lymphs Abs: 1.5 10*3/uL (ref 0.7–4.0)
MCH: 30.2 pg (ref 26.0–34.0)
MCHC: 31.8 g/dL (ref 30.0–36.0)
MCV: 95 fL (ref 80.0–100.0)
Monocytes Absolute: 0.5 10*3/uL (ref 0.1–1.0)
Monocytes Relative: 6 %
Neutro Abs: 4.7 10*3/uL (ref 1.7–7.7)
Neutrophils Relative %: 66 %
Platelets: 219 10*3/uL (ref 150–400)
RBC: 3.21 MIL/uL — ABNORMAL LOW (ref 4.22–5.81)
RDW: 15.2 % (ref 11.5–15.5)
WBC: 7.1 10*3/uL (ref 4.0–10.5)
nRBC: 0 % (ref 0.0–0.2)

## 2019-10-27 LAB — COMPREHENSIVE METABOLIC PANEL
ALT: 15 U/L (ref 0–44)
AST: 27 U/L (ref 15–41)
Albumin: 2.1 g/dL — ABNORMAL LOW (ref 3.5–5.0)
Alkaline Phosphatase: 169 U/L — ABNORMAL HIGH (ref 38–126)
Anion gap: 8 (ref 5–15)
BUN: 6 mg/dL (ref 6–20)
CO2: 20 mmol/L — ABNORMAL LOW (ref 22–32)
Calcium: 8.2 mg/dL — ABNORMAL LOW (ref 8.9–10.3)
Chloride: 110 mmol/L (ref 98–111)
Creatinine, Ser: 0.8 mg/dL (ref 0.61–1.24)
GFR calc Af Amer: >60 mL/min (ref >60)
GFR calc non Af Amer: >60 mL/min (ref >60)
Glucose, Bld: 117 mg/dL — ABNORMAL HIGH (ref 70–99)
Potassium: 3.9 mmol/L (ref 3.5–5.1)
Sodium: 138 mmol/L (ref 135–145)
Total Bilirubin: 1.3 mg/dL — ABNORMAL HIGH (ref 0.3–1.2)
Total Protein: 7.4 g/dL (ref 6.5–8.1)

## 2019-10-27 LAB — MAGNESIUM: Magnesium: 1.6 mg/dL — ABNORMAL LOW (ref 1.7–2.4)

## 2019-10-27 LAB — GLUCOSE, CAPILLARY
Glucose-Capillary: 117 mg/dL — ABNORMAL HIGH (ref 70–99)
Glucose-Capillary: 108 mg/dL — ABNORMAL HIGH (ref 70–99)
Glucose-Capillary: 142 mg/dL — ABNORMAL HIGH (ref 70–99)
Glucose-Capillary: 97 mg/dL (ref 70–99)

## 2019-10-27 LAB — PHOSPHORUS: Phosphorus: 3.4 mg/dL (ref 2.5–4.6)

## 2019-10-27 LAB — VANCOMYCIN, PEAK: Vancomycin Pk: 30 ug/mL (ref 30–40)

## 2019-10-27 MED ORDER — ENOXAPARIN SODIUM 40 MG/0.4ML ~~LOC~~ SOLN
40.0000 mg | SUBCUTANEOUS | Status: DC
  Administered 2019-10-27 – 2019-11-06 (×11): 40 mg via SUBCUTANEOUS
  Filled 2019-10-27 (×11): qty 0.4

## 2019-10-27 MED ORDER — ALUM & MAG HYDROXIDE-SIMETH 200-200-20 MG/5ML PO SUSP
30.0000 mL | Freq: Once | ORAL | Status: AC
  Administered 2019-10-27: 30 mL via ORAL
  Filled 2019-10-27: qty 30

## 2019-10-27 MED ORDER — MAGNESIUM SULFATE 2 GM/50ML IV SOLN
2.0000 g | Freq: Once | INTRAVENOUS | Status: AC
  Administered 2019-10-27: 2 g via INTRAVENOUS
  Filled 2019-10-27: qty 50

## 2019-10-27 MED ORDER — LIDOCAINE VISCOUS HCL 2 % MT SOLN
15.0000 mL | Freq: Once | OROMUCOSAL | Status: AC
  Administered 2019-10-27: 15 mL via ORAL
  Filled 2019-10-27: qty 15

## 2019-10-27 NOTE — Anesthesia Postprocedure Evaluation (Signed)
Anesthesia Post Note  Patient: Cory Palmer  Procedure(s) Performed: TRANSESOPHAGEAL ECHOCARDIOGRAM (TEE) (N/A )     Patient location during evaluation: PACU Anesthesia Type: MAC Level of consciousness: awake and alert Pain management: pain level controlled Vital Signs Assessment: post-procedure vital signs reviewed and stable Respiratory status: spontaneous breathing, nonlabored ventilation, respiratory function stable and patient connected to nasal cannula oxygen Cardiovascular status: stable and blood pressure returned to baseline Postop Assessment: no apparent nausea or vomiting Anesthetic complications: no    Last Vitals:  Vitals:   10/27/19 0748 10/27/19 1419  BP: (!) 94/55 112/76  Pulse: 66 60  Resp: 18 18  Temp: 36.8 C 37.1 C  SpO2: 94% 93%    Last Pain:  Vitals:   10/27/19 1419  TempSrc: Oral  PainSc:                  Asanti Craigo

## 2019-10-27 NOTE — Progress Notes (Signed)
PROGRESS NOTE    Cory Palmer  GBE:[id number] DOB: Jul 24, 1961 DOA: 10/22/2019 PCP: Patient, [name] Per   Brief Narrative: Cory Palmer is a 59 y.o. male with medical history significant for bipolar disorder, liver cirrhosis, type 2 diabetes mellitus, recent admission with osteomyelitis, MRSA bacteremia with left foot with suspected source, and was planned for surgery left AMA on 10/21/2019 now returns requesting resumption of his care.  Patient reports that he was able to attend to some business that had required to leave AMA, is now dedicated to treatment of his infection, and states that his brother is planning to come from out of town to assist him in his recovery.  Patient denies any fevers or chills, has not been taking any medications since leaving the hospital, and denies any chest pain, abdominal pain, cough, or shortness of breath.  He continues to have some bloody and purulent drainage from the left foot.  ED Course: Upon arrival to the ED, patient is found to be afebrile, saturating, stable blood pressure.  Chemistry panel with sodium 132 her bicarbonate is 19.  CBC with leukocytosis to 10,900 and stable normocytic anemia with hemoglobin 8.5.  Covid PCR screening test has been ordered hospitalists consulted for admission.  Assessment & Plan:   Principal Problem:   MRSA bacteremia Active Problems:   Diabetes mellitus type II, non insulin dependent (HCC)   S/P transmetatarsal amputation of foot, left (HCC)   Depressed bipolar disorder (HCC)   PAF (paroxysmal atrial fibrillation) (HCC)   Osteomyelitis of left foot (HCC)   Hyponatremia  1. MRSA bacteremia; left foot osteomyelitis  - Patient was admitted with worsening foot wound on 3/20 - plain films concerning for osteomyelitis - blood culture from 3/20 growing MRSA - repeat blood cx 3/22 NGTD  - echo 3/22 -> EF 60-65% - evidence of atrial level shunting.   - TEE 3/26 without vegetations - Continue vancomycin.  ID  recommending 12 more days from 3/26.  Therapy will be completed on April 7th.  Unasyn now d/c'd after L BKA. - ID c/s, appreciate recs - Ortho c/s, s/p transtibial amputation on 3/24 - recommends f/u in 1 week - Dispo now main issue, difficult to get patient buy in for SNF or CIR -> ID recommending to keep in house with supervised medical therapy and ongoing rehab if he's not agreeable to SNF or CIR.  Will continue to discuss with pt.  2. Paroxysmal atrial fibrillation  - Not anticoagulated prior to admission, CHADS-VASc may only be 1 (DM), but also poor candidate due to chronic anemia and poor adherence   3. Type II DM  - A1c was 6,8% earlier this month  - Check CBGs and use a low-intensity SSI for now    4. Hyponatremia  - Serum sodium is 132 in ED, similar to priors, appears euvolemic, likely related to cirrhosis, will monitor    # Hypomagnesemia: replace and follow  5. Bipolar disorder  - Continue Seroquel, Zoloft, trazodone    6. Anemia  Iron Def Anemia - Trend H/H - stable - iron, b12, folate, ferritin - suggestive of iron def anemia - Patient denies melena or hematochezia; likely related to cirrhosis, chronic infection  - Needs f/u for IDA, CRC screening if not done  7. Cirrhosis  - Appears compensated   DVT prophylaxis: SCD Code Status: full Family Communication: brother at bedside Disposition Plan:  . Patient came from: home            .  Anticipated d/c place:home . Barriers to d/c OR conditions which need to be met to effect a safe d/c: pending TEE, clearance by ortho   Consultants:   orthopedics  Procedures:  3/24 - s/p L transtibial amputation  Echo IMPRESSIONS    1. Normal LV function; trace AI; mild MR and TR; moderate LAE; no  vegetatioins.  2. Left ventricular ejection fraction, by estimation, is 55 to 60%. The  left ventricle has normal function. The left ventricle has no regional  wall motion abnormalities.  3. Right ventricular  systolic function is normal. The right ventricular  size is normal.  4. Left atrial size was moderately dilated. No left atrial/left atrial  appendage thrombus was detected.  5. The mitral valve is normal in structure. Mild mitral valve  regurgitation.  6. The aortic valve is tricuspid. Aortic valve regurgitation is trivial.  Mild aortic valve sclerosis is present, with no evidence of aortic valve  stenosis.  7. There is mild (Grade II) plaque involving the descending aorta.   Antimicrobials:  Anti-infectives (From admission, onward)   Start     Dose/Rate Route Frequency Ordered Stop   10/27/19 0000  vancomycin (VANCOREADY) IVPB 1250 mg/250 mL     1,250 mg 166.7 mL/hr over 90 Minutes Intravenous Every 12 hours 10/26/19 1425     10/23/19 1400  vancomycin (VANCOREADY) IVPB 1250 mg/250 mL  Status:  Discontinued     1,250 mg 166.7 mL/hr over 90 Minutes Intravenous Every 12 hours 10/23/19 0129 10/26/19 1425   10/23/19 1030  ceFAZolin (ANCEF) IVPB 2g/100 mL premix  Status:  Discontinued     2 g 200 mL/hr over 30 Minutes Intravenous On call to O.R. 10/23/19 1026 10/23/19 1412   10/23/19 1000  Ampicillin-Sulbactam (UNASYN) 3 g in sodium chloride 0.9 % 100 mL IVPB  Status:  Discontinued     3 g 200 mL/hr over 30 Minutes Intravenous Every 6 hours 10/23/19 0858 10/24/19 1239   10/23/19 0130  vancomycin (VANCOREADY) IVPB 2000 mg/400 mL     2,000 mg 200 mL/hr over 120 Minutes Intravenous  Once 10/23/19 0128 10/23/19 0630     Subjective: Keeps saying he'll think about SNF, but not really interested  Objective: Vitals:   10/26/19 1936 10/27/19 0438 10/27/19 0748 10/27/19 1419  BP: 117/76 109/63 (!) 94/55 112/76  Pulse: 61 65 66 60  Resp: 18 18 18 18   Temp: 98.4 F (36.9 C) 98.2 F (36.8 C) 98.3 F (36.8 C) 98.7 F (37.1 C)  TempSrc: Oral Oral Oral Oral  SpO2: 99% 92% 94% 93%  Weight:      Height:        Intake/Output Summary (Last 24 hours) at 10/27/2019 1441 Last data filed  at 10/27/2019 1100 Gross per 24 hour  Intake 867.17 ml  Output 2200 ml  Net -1332.83 ml   Filed Weights   10/22/19 2329 10/25/19 0734  Weight: 113.9 kg 113.9 kg    Examination:  General: No acute distress. Cardiovascular: Heart sounds show a regular rate, and rhythm. Lungs: Clear to auscultation bilaterally Abdomen: Soft, nontender, nondistended  Neurological: Alert and oriented 3. Moves all extremities 4 . Cranial nerves II through XII grossly intact. Skin: Warm and dry. No rashes or lesions. Extremities: bilateral BKA, L with wound vac     Data Reviewed: I have personally reviewed following labs and imaging studies  CBC: Recent Labs  Lab 10/23/19 0532 10/23/19 0532 10/23/19 1638 10/24/19 0440 10/25/19 0403 10/26/19 0521 10/27/19 0637  WBC 7.6  --   --  5.6 6.7 7.6 7.1  NEUTROABS 5.1  --   --  3.7  --  5.2 4.7  HGB 7.7*   < > 7.8* 7.8* 8.9* 8.8* 9.7*  HCT 23.1*   < > 24.7* 24.5* 27.9* 27.8* 30.5*  MCV 93.1  --   --  93.9 94.6 95.2 95.0  PLT 201  --   --  196 214 227 219   < > = values in this interval not displayed.   Basic Metabolic Panel: Recent Labs  Lab 10/23/19 0532 10/24/19 0440 10/25/19 0403 10/26/19 0521 10/27/19 0637  NA 138 136 138 137 138  K 3.4* 3.8 4.0 4.1 3.9  CL 108 107 107 107 110  CO2 22 21* 21* 21* 20*  GLUCOSE 115* 119* 163* 142* 117*  BUN 6 <5* 5* 6 6  CREATININE 0.64 0.63 0.83 0.65 0.80  CALCIUM 8.0* 7.6* 7.9* 8.1* 8.2*  MG 1.7 1.6* 1.6* 1.9 1.6*  PHOS  --  3.1 3.3 3.4 3.4   GFR: Estimated Creatinine Clearance: 137.3 mL/min (by C-G formula based on SCr of 0.8 mg/dL). Liver Function Tests: Recent Labs  Lab 10/23/19 0532 10/24/19 0440 10/25/19 0403 10/26/19 0521 10/27/19 0637  AST 31 37 31 25 27   ALT 17 18 19 16 15   ALKPHOS 184* 178* 185* 157* 169*  BILITOT 1.3* 1.4* 1.1 1.2 1.3*  PROT 6.2* 6.4* 6.7 6.7 7.4  ALBUMIN 1.9* 1.8* 2.0* 2.0* 2.1*   No results for input(s): LIPASE, AMYLASE in the last 168 hours. No results  for input(s): AMMONIA in the last 168 hours. Coagulation Profile: Recent Labs  Lab 10/23/19 0532  INR 1.4*   Cardiac Enzymes: No results for input(s): CKTOTAL, CKMB, CKMBINDEX, TROPONINI in the last 168 hours. BNP (last 3 results) No results for input(s): PROBNP in the last 8760 hours. HbA1C: No results for input(s): HGBA1C in the last 72 hours. CBG: Recent Labs  Lab 10/26/19 1543 10/26/19 1804 10/26/19 2110 10/27/19 0635 10/27/19 1135  GLUCAP 143* 112* 110* 117* 108*   Lipid Profile: No results for input(s): CHOL, HDL, LDLCALC, TRIG, CHOLHDL, LDLDIRECT in the last 72 hours. Thyroid Function Tests: No results for input(s): TSH, T4TOTAL, FREET4, T3FREE, THYROIDAB in the last 72 hours. Anemia Panel: No results for input(s): VITAMINB12, FOLATE, FERRITIN, TIBC, IRON, RETICCTPCT in the last 72 hours. Sepsis Labs: No results for input(s): PROCALCITON, LATICACIDVEN in the last 168 hours.  Recent Results (from the past 240 hour(s))  Blood culture (routine x 2)     Status: Abnormal   Collection Time: 10/19/19  2:51 PM   Specimen: BLOOD RIGHT FOREARM  Result Value Ref Range Status   Specimen Description   Final    BLOOD RIGHT FOREARM Performed at [facility], [address], [place], [place] [address]    Special Requests   Final    BOTTLES DRAWN AEROBIC AND ANAEROBIC Blood Culture adequate volume Performed at [facility] [address] [place], [place] [address]    Culture  Setup Time   Final    GRAM POSITIVE COCCI IN CLUSTERS IN BOTH AEROBIC AND ANAEROBIC BOTTLES CRITICAL RESULT CALLED TO, READ BACK BY AND VERIFIED WITH: [place] [id number] AT 5956 BY CM Performed at [facility], [place] [address], [place], [place] [address]    Culture METHICILLIN RESISTANT STAPHYLOCOCCUS AUREUS (A)  Final   Report Status 10/22/2019 FINAL  Final   Organism ID, Bacteria METHICILLIN RESISTANT STAPHYLOCOCCUS AUREUS  Final  Susceptibility    Methicillin resistant staphylococcus aureus - MIC*    CIPROFLOXACIN 4 RESISTANT Resistant     ERYTHROMYCIN >=8 RESISTANT Resistant     GENTAMICIN <=0.5 SENSITIVE Sensitive     OXACILLIN >=4 RESISTANT Resistant     TETRACYCLINE >=16 RESISTANT Resistant     VANCOMYCIN <=0.5 SENSITIVE Sensitive     TRIMETH/SULFA <=10 SENSITIVE Sensitive     CLINDAMYCIN <=0.25 SENSITIVE Sensitive     RIFAMPIN <=0.5 SENSITIVE Sensitive     Inducible Clindamycin NEGATIVE Sensitive     * METHICILLIN RESISTANT STAPHYLOCOCCUS AUREUS  Respiratory Panel by RT PCR (Flu A&B, Covid) - Nasopharyngeal Swab     Status: None   Collection Time: 10/19/19  8:07 PM   Specimen: Nasopharyngeal Swab  Result Value Ref Range Status   SARS Coronavirus 2 by RT PCR NEGATIVE NEGATIVE Final    Comment: (NOTE) SARS-CoV-2 target nucleic acids are NOT DETECTED. The SARS-CoV-2 RNA is generally detectable in upper respiratoy specimens during the acute phase of infection. The lowest concentration of SARS-CoV-2 viral copies this assay can detect is 131 copies/mL. A negative result does not preclude SARS-Cov-2 infection and should not be used as the sole basis for treatment or other patient management decisions. A negative result may occur with  improper specimen collection/handling, submission of specimen other than nasopharyngeal swab, presence of viral mutation(s) within the areas targeted by this assay, and inadequate number of viral copies (<131 copies/mL). A negative result must be combined with clinical observations, patient history, and epidemiological information. The expected result is Negative. Fact Sheet for Patients:  PinkCheek.be Fact Sheet for Healthcare Providers:  GravelBags.it This test is not yet ap proved or cleared by the [place] FDA and  has been authorized for detection and/or diagnosis of SARS-CoV-2 by FDA under an Emergency Use Authorization (EUA).  This EUA will remain  in effect (meaning this test can be used) for the duration of the COVID-19 declaration under Section 564(b)(1) of the Act, 21 U.S.C. section 360bbb-3(b)(1), unless the authorization is terminated or revoked sooner.    Influenza A by PCR NEGATIVE NEGATIVE Final   Influenza B by PCR NEGATIVE NEGATIVE Final    Comment: (NOTE) The Xpert Xpress SARS-CoV-2/FLU/RSV assay is intended as an aid in  the diagnosis of influenza from Nasopharyngeal swab specimens and  should not be used as a sole basis for treatment. Nasal washings and  aspirates are unacceptable for Xpert Xpress SARS-CoV-2/FLU/RSV  testing. Fact Sheet for Patients: PinkCheek.be Fact Sheet for Healthcare Providers: GravelBags.it This test is not yet approved or cleared by the [place] FDA and  has been authorized for detection and/or diagnosis of SARS-CoV-2 by  FDA under an Emergency Use Authorization (EUA). This EUA will remain  in effect (meaning this test can be used) for the duration of the  Covid-19 declaration under Section 564(b)(1) of the Act, 21  U.S.C. section 360bbb-3(b)(1), unless the authorization is  terminated or revoked. Performed at [facility], [place] [address], [place], [place] [address]   Blood culture (routine x 2)     Status: None   Collection Time: 10/20/19  5:04 AM   Specimen: BLOOD  Result Value Ref Range Status   Specimen Description   Final    BLOOD LEFT ANTECUBITAL Performed at [facility] [address], [place], [place] [address]    Special Requests   Final    BOTTLES DRAWN AEROBIC ONLY Blood Culture adequate volume Performed at [facility],  [place] [address], [place], [place] [address]    Culture   Final    NO GROWTH 5 DAYS Performed at [facility], [place] [address], [place], [place] [address]    Report Status 10/25/2019 FINAL  Final  Culture,  blood (Routine X 2) w Reflex to ID Panel     Status: None   Collection Time: 10/21/19  4:35 PM   Specimen: BLOOD LEFT HAND  Result Value Ref Range Status   Specimen Description BLOOD LEFT HAND  Final   Special Requests   Final    BOTTLES DRAWN AEROBIC ONLY Blood Culture adequate volume   Culture   Final    NO GROWTH 5 DAYS Performed at [facility], [place] [address] [place], [place] [address]    Report Status 10/26/2019 FINAL  Final  Culture, blood (Routine X 2) w Reflex to ID Panel     Status: None   Collection Time: 10/21/19  4:35 PM   Specimen: BLOOD LEFT HAND  Result Value Ref Range Status   Specimen Description BLOOD LEFT HAND  Final   Special Requests   Final    BOTTLES DRAWN AEROBIC AND ANAEROBIC Blood Culture adequate volume   Culture   Final    NO GROWTH 5 DAYS Performed at [facility], [place] [address] [place], [place] [address]    Report Status 10/26/2019 FINAL  Final  SARS CORONAVIRUS 2 (TAT 6-24 HRS) Nasopharyngeal Nasopharyngeal Swab     Status: None   Collection Time: 10/23/19  1:30 AM   Specimen: Nasopharyngeal Swab  Result Value Ref Range Status   SARS Coronavirus 2 NEGATIVE NEGATIVE Final    Comment: (NOTE) SARS-CoV-2 target nucleic acids are NOT DETECTED. The SARS-CoV-2 RNA is generally detectable in upper and lower respiratory specimens during the acute phase of infection. Negative results do not preclude SARS-CoV-2 infection, do not rule out co-infections with other pathogens, and should not be used as the sole basis for treatment or other patient management decisions. Negative results must be combined with clinical observations, patient history, and epidemiological information. The expected result is Negative. Fact Sheet for Patients: SugarRoll.be Fact Sheet for Healthcare Providers: https://www.woods-mathews.com/ This test is not yet approved or cleared by the [place] FDA and  has been  authorized for detection and/or diagnosis of SARS-CoV-2 by FDA under an Emergency Use Authorization (EUA). This EUA will remain  in effect (meaning this test can be used) for the duration of the COVID-19 declaration under Section 56 4(b)(1) of the Act, 21 U.S.C. section 360bbb-3(b)(1), unless the authorization is terminated or revoked sooner. Performed at [facility], [place] [address] [place], [place] [address]   Surgical pcr screen     Status: Abnormal   Collection Time: 10/23/19  3:27 AM   Specimen: Nasal Mucosa; Nasal Swab  Result Value Ref Range Status   MRSA, PCR POSITIVE (A) NEGATIVE Final    Comment: RESULT CALLED TO, READ BACK BY AND VERIFIED WITH: COLLIE,L RN [phone] 10/23/2019 MITCHELL,L    Staphylococcus aureus POSITIVE (A) NEGATIVE Final    Comment: (NOTE) The Xpert SA Assay (FDA approved for NASAL specimens in patients 35 years of age and older), is one component of a comprehensive surveillance program. It is not intended to diagnose infection nor to guide or monitor treatment.          Radiology Studies: [place] EKG SITE RITE  Result Date: 10/26/2019 If [facility] image not attached, placement could not be  confirmed due to current cardiac rhythm.       Scheduled Meds: . Chlorhexidine Gluconate Cloth  6 each Topical Q0600  . docusate sodium  100 mg Oral BID  . enoxaparin (LOVENOX) injection  40 mg Subcutaneous Q24H  . ferrous sulfate  325 mg Oral Q breakfast  . insulin aspart  0-5 Units Subcutaneous QHS  . insulin aspart  0-9 Units Subcutaneous TID WC  . mupirocin ointment  1 application Nasal BID  . QUEtiapine  300 mg Oral QHS  . sertraline  50 mg Oral Daily  . traZODone  150 mg Oral QHS   Continuous Infusions: . sodium chloride 10 mL/hr at 10/26/19 2343  . lactated ringers 500 mL/hr at 10/23/19 1329  . vancomycin 1,250 mg (10/27/19 1208)     LOS: 4 days    Time spent: over 30 min    Fayrene Helper, MD [facility]   To contact the  attending provider between 7A-7P or the covering provider during after hours 7P-7A, please log into the web site [email or web address] and access using universal [place] password for that web site. If you do not have the password, please call the hospital operator.  10/27/2019, 2:41 PM

## 2019-10-28 LAB — COMPREHENSIVE METABOLIC PANEL
ALT: 14 U/L (ref 0–44)
AST: 26 U/L (ref 15–41)
Albumin: 2.2 g/dL — ABNORMAL LOW (ref 3.5–5.0)
Alkaline Phosphatase: 163 U/L — ABNORMAL HIGH (ref 38–126)
Anion gap: 12 (ref 5–15)
BUN: 10 mg/dL (ref 6–20)
CO2: 21 mmol/L — ABNORMAL LOW (ref 22–32)
Calcium: 8.3 mg/dL — ABNORMAL LOW (ref 8.9–10.3)
Chloride: 105 mmol/L (ref 98–111)
Creatinine, Ser: 0.65 mg/dL (ref 0.61–1.24)
GFR calc Af Amer: >60 mL/min (ref >60)
GFR calc non Af Amer: >60 mL/min (ref >60)
Glucose, Bld: 142 mg/dL — ABNORMAL HIGH (ref 70–99)
Potassium: 4.1 mmol/L (ref 3.5–5.1)
Sodium: 138 mmol/L (ref 135–145)
Total Bilirubin: 1.2 mg/dL (ref 0.3–1.2)
Total Protein: 7.2 g/dL (ref 6.5–8.1)

## 2019-10-28 LAB — CBC WITH DIFFERENTIAL/PLATELET
Abs Immature Granulocytes: 0.06 10*3/uL (ref 0.00–0.07)
Basophils Absolute: 0 10*3/uL (ref 0.0–0.1)
Basophils Relative: 1 %
Eosinophils Absolute: 0.3 10*3/uL (ref 0.0–0.5)
Eosinophils Relative: 4 %
HCT: 29.8 % — ABNORMAL LOW (ref 39.0–52.0)
Hemoglobin: 9.4 g/dL — ABNORMAL LOW (ref 13.0–17.0)
Immature Granulocytes: 1 %
Lymphocytes Relative: 15 %
Lymphs Abs: 1.2 10*3/uL (ref 0.7–4.0)
MCH: 30.2 pg (ref 26.0–34.0)
MCHC: 31.5 g/dL (ref 30.0–36.0)
MCV: 95.8 fL (ref 80.0–100.0)
Monocytes Absolute: 0.5 10*3/uL (ref 0.1–1.0)
Monocytes Relative: 6 %
Neutro Abs: 6 10*3/uL (ref 1.7–7.7)
Neutrophils Relative %: 73 %
Platelets: 242 10*3/uL (ref 150–400)
RBC: 3.11 MIL/uL — ABNORMAL LOW (ref 4.22–5.81)
RDW: 15.5 % (ref 11.5–15.5)
WBC: 8.1 10*3/uL (ref 4.0–10.5)
nRBC: 0 % (ref 0.0–0.2)

## 2019-10-28 LAB — GLUCOSE, CAPILLARY
Glucose-Capillary: 107 mg/dL — ABNORMAL HIGH (ref 70–99)
Glucose-Capillary: 132 mg/dL — ABNORMAL HIGH (ref 70–99)
Glucose-Capillary: 125 mg/dL — ABNORMAL HIGH (ref 70–99)
Glucose-Capillary: 171 mg/dL — ABNORMAL HIGH (ref 70–99)

## 2019-10-28 LAB — MAGNESIUM: Magnesium: 1.8 mg/dL (ref 1.7–2.4)

## 2019-10-28 LAB — VANCOMYCIN, TROUGH: Vancomycin Tr: 13 ug/mL — ABNORMAL LOW (ref 15–20)

## 2019-10-28 LAB — PHOSPHORUS: Phosphorus: 3.2 mg/dL (ref 2.5–4.6)

## 2019-10-28 MED ORDER — SODIUM CHLORIDE 0.9% FLUSH: 10.0000 mL | INTRAVENOUS | Status: DC | PRN

## 2019-10-28 MED ORDER — ALUM & MAG HYDROXIDE-SIMETH 200-200-20 MG/5ML PO SUSP
30.0000 mL | ORAL | Status: DC | PRN
  Administered 2019-10-29: 30 mL via ORAL
  Filled 2019-10-28: qty 30

## 2019-10-28 MED ORDER — SODIUM CHLORIDE 0.9% FLUSH
10.0000 mL | Freq: Two times a day (BID) | INTRAVENOUS | Status: DC
  Administered 2019-10-29 – 2019-11-05 (×12): 10 mL

## 2019-10-28 NOTE — Progress Notes (Signed)
PROGRESS NOTE    Cory Palmer  QAS:[id number] DOB: Dec 05, 1960 DOA: 10/22/2019 PCP: Patient, [name] Per   Brief Narrative: CIPRIANO MILLIKAN is Cory Palmer 59 y.o. male with medical history significant for bipolar disorder, liver cirrhosis, type 2 diabetes mellitus, recent admission with osteomyelitis, MRSA bacteremia with left foot with suspected source, and was planned for surgery left AMA on 10/21/2019 now returns requesting resumption of his care.  Patient reports that he was able to attend to some business that had required to leave AMA, is now dedicated to treatment of his infection, and states that his brother is planning to come from out of town to assist him in his recovery.  Patient denies any fevers or chills, has not been taking any medications since leaving the hospital, and denies any chest pain, abdominal pain, cough, or shortness of breath.  He continues to have some bloody and purulent drainage from the left foot.  ED Course: Upon arrival to the ED, patient is found to be afebrile, saturating, stable blood pressure.  Chemistry panel with sodium 132 her bicarbonate is 19.  CBC with leukocytosis to 10,900 and stable normocytic anemia with hemoglobin 8.5.  Covid PCR screening test has been ordered hospitalists consulted for admission.  Assessment & Plan:   Principal Problem:   MRSA bacteremia Active Problems:   Diabetes mellitus type II, non insulin dependent (HCC)   S/P transmetatarsal amputation of foot, left (HCC)   Depressed bipolar disorder (HCC)   PAF (paroxysmal atrial fibrillation) (HCC)   Osteomyelitis of left foot (HCC)   Hyponatremia  1. MRSA bacteremia; left foot osteomyelitis  - Patient was admitted with worsening foot wound on 3/20 - plain films concerning for osteomyelitis - blood culture from 3/20 growing MRSA - repeat blood cx 3/22 NGTD  - echo 3/22 -> EF 60-65% - evidence of atrial level shunting.   - TEE 3/26 without vegetations - Continue vancomycin.  ID  recommending 12 more days from 3/26.  Therapy will be completed on April 7th.  Unasyn now d/c'd after L BKA. - ID c/s, appreciate recs - Ortho c/s, s/p transtibial amputation on 3/24 - recommends f/u in 1 week - Dispo now main issue, difficult to get patient buy in for SNF or CIR -> ID recommending to keep in house with supervised medical therapy and ongoing rehab if he's not agreeable to SNF or CIR.  Will continue to discuss with pt, but at this time, will plan to keep in house until his therapy is complete.  2. Paroxysmal atrial fibrillation  - Not anticoagulated prior to admission, CHADS-VASc may only be 1 (DM), but also poor candidate due to chronic anemia and poor adherence   3. Type II DM  - A1c was 6,8% earlier this month  - Check CBGs and use Malayla Granberry low-intensity SSI for now    4. Hyponatremia  - Serum sodium is 132 in ED, similar to priors, appears euvolemic, likely related to cirrhosis, will monitor    # Hypomagnesemia: replace and follow  5. Bipolar disorder  - Continue Seroquel, Zoloft, trazodone    6. Anemia  Iron Def Anemia - Trend H/H - stable - iron, b12, folate, ferritin - suggestive of iron def anemia - Patient denies melena or hematochezia; likely related to cirrhosis, chronic infection  - Needs f/u for IDA, CRC screening if not done  7. Cirrhosis  - Appears compensated   DVT prophylaxis: SCD Code Status: full Family Communication: brother at bedside Disposition Plan:  . Patient  came from: home            . Anticipated d/c place:home . Barriers to d/c OR conditions which need to be met to effect Tremell Reimers safe d/c: pending TEE, clearance by ortho   Consultants:   orthopedics  Procedures:  3/24 - s/p L transtibial amputation  Echo IMPRESSIONS    1. Normal LV function; trace AI; mild MR and TR; moderate LAE; no  vegetatioins.  2. Left ventricular ejection fraction, by estimation, is 55 to 60%. The  left ventricle has normal function. The left  ventricle has no regional  wall motion abnormalities.  3. Right ventricular systolic function is normal. The right ventricular  size is normal.  4. Left atrial size was moderately dilated. No left atrial/left atrial  appendage thrombus was detected.  5. The mitral valve is normal in structure. Mild mitral valve  regurgitation.  6. The aortic valve is tricuspid. Aortic valve regurgitation is trivial.  Mild aortic valve sclerosis is present, with no evidence of aortic valve  stenosis.  7. There is mild (Grade II) plaque involving the descending aorta.   Antimicrobials:  Anti-infectives (From admission, onward)   Start     Dose/Rate Route Frequency Ordered Stop   10/27/19 0000  vancomycin (VANCOREADY) IVPB 1250 mg/250 mL     1,250 mg 166.7 mL/hr over 90 Minutes Intravenous Every 12 hours 10/26/19 1425     10/23/19 1400  vancomycin (VANCOREADY) IVPB 1250 mg/250 mL  Status:  Discontinued     1,250 mg 166.7 mL/hr over 90 Minutes Intravenous Every 12 hours 10/23/19 0129 10/26/19 1425   10/23/19 1030  ceFAZolin (ANCEF) IVPB 2g/100 mL premix  Status:  Discontinued     2 g 200 mL/hr over 30 Minutes Intravenous On call to O.R. 10/23/19 1026 10/23/19 1412   10/23/19 1000  Ampicillin-Sulbactam (UNASYN) 3 g in sodium chloride 0.9 % 100 mL IVPB  Status:  Discontinued     3 g 200 mL/hr over 30 Minutes Intravenous Every 6 hours 10/23/19 0858 10/24/19 1239   10/23/19 0130  vancomycin (VANCOREADY) IVPB 2000 mg/400 mL     2,000 mg 200 mL/hr over 120 Minutes Intravenous  Once 10/23/19 0128 10/23/19 0630     Subjective: No new complaints  Objective: Vitals:   10/27/19 1419 10/27/19 1927 10/28/19 0242 10/28/19 0724  BP: 112/76 120/73 111/65 112/66  Pulse: 60 64 62 65  Resp: 18 18 16 18   Temp: 98.7 F (37.1 C) 98.4 F (36.9 C) 98.1 F (36.7 C) 98 F (36.7 C)  TempSrc: Oral Oral Oral Oral  SpO2: 93% 95% 95% 95%  Weight:      Height:        Intake/Output Summary (Last 24 hours) at  10/28/2019 1540 Last data filed at 10/28/2019 1300 Gross per 24 hour  Intake 1948.24 ml  Output 1025 ml  Net 923.24 ml   Filed Weights   10/22/19 2329 10/25/19 0734  Weight: 113.9 kg 113.9 kg    Examination:  General: No acute distress. Cardiovascular: Heart sounds show Edna Grover regular rate, and rhythm Lungs: Clear to auscultation bilaterally  Abdomen: Soft, nontender, nondistended  Neurological: Alert and oriented 3. Moves all extremities 4 . Cranial nerves II through XII grossly intact. Skin: Warm and dry. No rashes or lesions. Extremities: bilateral BKA, LLE with wound vac      Data Reviewed: I have personally reviewed following labs and imaging studies  CBC: Recent Labs  Lab 10/23/19 0532 10/23/19 1638 10/24/19 0440 10/25/19 0403  10/26/19 0521 10/27/19 0637 10/28/19 0503  WBC 7.6  --  5.6 6.7 7.6 7.1 8.1  NEUTROABS 5.1  --  3.7  --  5.2 4.7 6.0  HGB 7.7*   < > 7.8* 8.9* 8.8* 9.7* 9.4*  HCT 23.1*   < > 24.5* 27.9* 27.8* 30.5* 29.8*  MCV 93.1  --  93.9 94.6 95.2 95.0 95.8  PLT 201  --  196 214 227 219 242   < > = values in this interval not displayed.   Basic Metabolic Panel: Recent Labs  Lab 10/24/19 0440 10/25/19 0403 10/26/19 0521 10/27/19 0637 10/28/19 0503  NA 136 138 137 138 138  K 3.8 4.0 4.1 3.9 4.1  CL 107 107 107 110 105  CO2 21* 21* 21* 20* 21*  GLUCOSE 119* 163* 142* 117* 142*  BUN <5* 5* 6 6 10   CREATININE 0.63 0.83 0.65 0.80 0.65  CALCIUM 7.6* 7.9* 8.1* 8.2* 8.3*  MG 1.6* 1.6* 1.9 1.6* 1.8  PHOS 3.1 3.3 3.4 3.4 3.2   GFR: Estimated Creatinine Clearance: 137.3 mL/min (by C-G formula based on SCr of 0.65 mg/dL). Liver Function Tests: Recent Labs  Lab 10/24/19 0440 10/25/19 0403 10/26/19 0521 10/27/19 0637 10/28/19 0503  AST 37 31 25 27 26   ALT 18 19 16 15 14   ALKPHOS 178* 185* 157* 169* 163*  BILITOT 1.4* 1.1 1.2 1.3* 1.2  PROT 6.4* 6.7 6.7 7.4 7.2  ALBUMIN 1.8* 2.0* 2.0* 2.1* 2.2*   No results for input(s): LIPASE, AMYLASE in  the last 168 hours. No results for input(s): AMMONIA in the last 168 hours. Coagulation Profile: Recent Labs  Lab 10/23/19 0532  INR 1.4*   Cardiac Enzymes: No results for input(s): CKTOTAL, CKMB, CKMBINDEX, TROPONINI in the last 168 hours. BNP (last 3 results) No results for input(s): PROBNP in the last 8760 hours. HbA1C: No results for input(s): HGBA1C in the last 72 hours. CBG: Recent Labs  Lab 10/27/19 1135 10/27/19 1615 10/27/19 2009 10/28/19 0647 10/28/19 1133  GLUCAP 108* 142* 97 107* 132*   Lipid Profile: No results for input(s): CHOL, HDL, LDLCALC, TRIG, CHOLHDL, LDLDIRECT in the last 72 hours. Thyroid Function Tests: No results for input(s): TSH, T4TOTAL, FREET4, T3FREE, THYROIDAB in the last 72 hours. Anemia Panel: No results for input(s): VITAMINB12, FOLATE, FERRITIN, TIBC, IRON, RETICCTPCT in the last 72 hours. Sepsis Labs: No results for input(s): PROCALCITON, LATICACIDVEN in the last 168 hours.  Recent Results (from the past 240 hour(s))  Blood culture (routine x 2)     Status: Abnormal   Collection Time: 10/19/19  2:51 PM   Specimen: BLOOD RIGHT FOREARM  Result Value Ref Range Status   Specimen Description   Final    BLOOD RIGHT FOREARM Performed at [facility], [address] [place], [place] [address]    Special Requests   Final    BOTTLES DRAWN AEROBIC AND ANAEROBIC Blood Culture adequate volume Performed at [place] [address] [place], [place] [address]    Culture  Setup Time   Final    GRAM POSITIVE COCCI IN CLUSTERS IN BOTH AEROBIC AND ANAEROBIC BOTTLES CRITICAL RESULT CALLED TO, READ BACK BY AND VERIFIED WITH: Spencer [id number] AT 8115 BY CM Performed at [facility], [place] [address], [place], [place] [address]    Culture METHICILLIN RESISTANT STAPHYLOCOCCUS AUREUS (Tiquan Bouch)  Final   Report Status 10/22/2019 FINAL  Final   Organism ID, Bacteria METHICILLIN RESISTANT STAPHYLOCOCCUS AUREUS  Final        Susceptibility   Methicillin resistant staphylococcus aureus - MIC*    CIPROFLOXACIN 4 RESISTANT Resistant     ERYTHROMYCIN >=8 RESISTANT Resistant     GENTAMICIN <=0.5 SENSITIVE Sensitive     OXACILLIN >=4 RESISTANT Resistant     TETRACYCLINE >=16 RESISTANT Resistant     VANCOMYCIN <=0.5 SENSITIVE Sensitive     TRIMETH/SULFA <=10 SENSITIVE Sensitive     CLINDAMYCIN <=0.25 SENSITIVE Sensitive     RIFAMPIN <=0.5 SENSITIVE Sensitive     Inducible Clindamycin NEGATIVE Sensitive     * METHICILLIN RESISTANT STAPHYLOCOCCUS AUREUS  Respiratory Panel by RT PCR (Flu Beena Catano&B, Covid) - Nasopharyngeal Swab     Status: None   Collection Time: 10/19/19  8:07 PM   Specimen: Nasopharyngeal Swab  Result Value Ref Range Status   SARS Coronavirus 2 by RT PCR NEGATIVE NEGATIVE Final    Comment: (NOTE) SARS-CoV-2 target nucleic acids are NOT DETECTED. The SARS-CoV-2 RNA is generally detectable in upper respiratoy specimens during the acute phase of infection. The lowest concentration of SARS-CoV-2 viral copies this assay can detect is 131 copies/mL. Aarit Kashuba negative result does not preclude SARS-Cov-2 infection and should not be used as the sole basis for treatment or other patient management decisions. Cinderella Christoffersen negative result may occur with  improper specimen collection/handling, submission of specimen other than nasopharyngeal swab, presence of viral mutation(s) within the areas targeted by this assay, and inadequate number of viral copies (<131 copies/mL). Gleb Mcguire negative result must be combined with clinical observations, patient history, and epidemiological information. The expected result is Negative. Fact Sheet for Patients:  PinkCheek.be Fact Sheet for Healthcare Providers:  GravelBags.it This test is not yet ap proved or cleared by the [place] FDA and  has been authorized for detection and/or diagnosis of SARS-CoV-2 by FDA under an Emergency  Use Authorization (EUA). This EUA will remain  in effect (meaning this test can be used) for the duration of the COVID-19 declaration under Section 564(b)(1) of the Act, 21 U.S.C. section 360bbb-3(b)(1), unless the authorization is terminated or revoked sooner.    Influenza Egon Dittus by PCR NEGATIVE NEGATIVE Final   Influenza B by PCR NEGATIVE NEGATIVE Final    Comment: (NOTE) The Xpert Xpress SARS-CoV-2/FLU/RSV assay is intended as an aid in  the diagnosis of influenza from Nasopharyngeal swab specimens and  should not be used as Seda Kronberg sole basis for treatment. Nasal washings and  aspirates are unacceptable for Xpert Xpress SARS-CoV-2/FLU/RSV  testing. Fact Sheet for Patients: PinkCheek.be Fact Sheet for Healthcare Providers: GravelBags.it This test is not yet approved or cleared by the [place] FDA and  has been authorized for detection and/or diagnosis of SARS-CoV-2 by  FDA under an Emergency Use Authorization (EUA). This EUA will remain  in effect (meaning this test can be used) for the duration of the  Covid-19 declaration under Section 564(b)(1) of the Act, 21  U.S.C. section 360bbb-3(b)(1), unless the authorization is  terminated or revoked. Performed at [facility], [place] [address] [place], [place] [address]   Blood culture (routine x 2)     Status: None   Collection Time: 10/20/19  5:04 AM   Specimen: BLOOD  Result Value Ref Range Status   Specimen Description   Final    BLOOD LEFT ANTECUBITAL Performed at [place] [address], [place], [place] [address]    Special Requests   Final    BOTTLES DRAWN AEROBIC ONLY Blood Culture adequate  volume Performed at [facility], [employer] [address], [place], [place] [address]    Culture   Final    NO GROWTH 5 DAYS Performed at [facility], [place] [address] [place], [place] [address]    Report Status 10/25/2019  FINAL  Final  Culture, blood (Routine X 2) w Reflex to ID Panel     Status: None   Collection Time: 10/21/19  4:35 PM   Specimen: BLOOD LEFT HAND  Result Value Ref Range Status   Specimen Description BLOOD LEFT HAND  Final   Special Requests   Final    BOTTLES DRAWN AEROBIC ONLY Blood Culture adequate volume   Culture   Final    NO GROWTH 5 DAYS Performed at [facility], [place] [address], [place], [place] [address]    Report Status 10/26/2019 FINAL  Final  Culture, blood (Routine X 2) w Reflex to ID Panel     Status: None   Collection Time: 10/21/19  4:35 PM   Specimen: BLOOD LEFT HAND  Result Value Ref Range Status   Specimen Description BLOOD LEFT HAND  Final   Special Requests   Final    BOTTLES DRAWN AEROBIC AND ANAEROBIC Blood Culture adequate volume   Culture   Final    NO GROWTH 5 DAYS Performed at [facility], [place] [address] [place], [place] [address]    Report Status 10/26/2019 FINAL  Final  SARS CORONAVIRUS 2 (TAT 6-24 HRS) Nasopharyngeal Nasopharyngeal Swab     Status: None   Collection Time: 10/23/19  1:30 AM   Specimen: Nasopharyngeal Swab  Result Value Ref Range Status   SARS Coronavirus 2 NEGATIVE NEGATIVE Final    Comment: (NOTE) SARS-CoV-2 target nucleic acids are NOT DETECTED. The SARS-CoV-2 RNA is generally detectable in upper and lower respiratory specimens during the acute phase of infection. Negative results do not preclude SARS-CoV-2 infection, do not rule out co-infections with other pathogens, and should not be used as the sole basis for treatment or other patient management decisions. Negative results must be combined with clinical observations, patient history, and epidemiological information. The expected result is Negative. Fact Sheet for Patients: SugarRoll.be Fact Sheet for Healthcare Providers: https://www.woods-mathews.com/ This test is not yet approved or cleared by the [place] FDA  and  has been authorized for detection and/or diagnosis of SARS-CoV-2 by FDA under an Emergency Use Authorization (EUA). This EUA will remain  in effect (meaning this test can be used) for the duration of the COVID-19 declaration under Section 56 4(b)(1) of the Act, 21 U.S.C. section 360bbb-3(b)(1), unless the authorization is terminated or revoked sooner. Performed at [facility], [place] [address], [place], [place] [address]   Surgical pcr screen     Status: Abnormal   Collection Time: 10/23/19  3:27 AM   Specimen: Nasal Mucosa; Nasal Swab  Result Value Ref Range Status   MRSA, PCR POSITIVE (Tavaughn Silguero) NEGATIVE Final    Comment: RESULT CALLED TO, READ BACK BY AND VERIFIED WITH: COLLIE,L RN [phone] 10/23/2019 MITCHELL,L    Staphylococcus aureus POSITIVE (Jon Lall) NEGATIVE Final    Comment: (NOTE) The Xpert SA Assay (FDA approved for NASAL specimens in patients 69 years of age and older), is one component of Joleah Kosak comprehensive surveillance program. It is not intended to diagnose infection nor to guide or monitor treatment.          Radiology Studies: No results found.      Scheduled Meds: .  docusate sodium  100 mg Oral BID  . enoxaparin (LOVENOX) injection  40 mg Subcutaneous Q24H  . ferrous sulfate  325 mg Oral Q breakfast  . insulin aspart  0-5 Units Subcutaneous QHS  . insulin aspart  0-9 Units Subcutaneous TID WC  . QUEtiapine  300 mg Oral QHS  . sertraline  50 mg Oral Daily  . traZODone  150 mg Oral QHS   Continuous Infusions: . sodium chloride 10 mL/hr at 10/26/19 2343  . lactated ringers 500 mL/hr at 10/23/19 1329  . vancomycin 1,250 mg (10/28/19 1215)     LOS: 5 days    Time spent: over 30 min    Fayrene Helper, MD [facility]   To contact the attending provider between 7A-7P or the covering provider during after hours 7P-7A, please log into the web site [email or web address] and access using universal [place] password for that web site. If you do not have  the password, please call the hospital operator.  10/28/2019, 3:40 PM

## 2019-10-28 NOTE — Progress Notes (Signed)
POD 6 BKA. Alert in bed no complaints.   Vac dressing in place. 0 cc in cannister.   CIR vs SNF. Follow up ortho for wound vac removel depending on hospital disposition.

## 2019-10-28 NOTE — Progress Notes (Signed)
Inpatient Rehab Admissions:  Note therapy recommendations changed to SNF.  Unfortunately, without recommended support at discharge, pt would not be a candidate for short term CIR, and we would recommend f/u with SNF for longer term rehab.   Signed: Estill Dooms, PT, DPT Admissions Coordinator [phone] 10/28/19  12:56 PM

## 2019-10-28 NOTE — Progress Notes (Signed)
Pharmacy Antibiotic Note  Cory Palmer is a 59 y.o. male admitted on 10/22/2019 with MRSA bacteremia/osteomyelitis.  Pharmacy has been consulted for Vancomycin dosing. WBC is WNL and stable. Renal function remains stable.   AUC tonight is therapeutic at 501  Plan: Cont vancomycin 1250 mg IV q12h Re-check levels as needed  Height: 6\' 4"  (193 cm) Weight: 251 lb (113.9 kg) IBW/kg (Calculated) : 86.8  Temp (24hrs), Avg:98.4 F (36.9 C), Min:98.2 F (36.8 C), Max:98.7 F (37.1 C)  Recent Labs  Lab 10/23/19 0532 10/24/19 0440 10/25/19 0403 10/26/19 0521 10/26/19 0521 10/26/19 1708 10/27/19 0637 10/27/19 1427 10/27/19 2325  WBC 7.6 5.6 6.7 7.6  --   --  7.1  --   --   CREATININE 0.64 0.63 0.83 0.65  --   --  0.80  --   --   VANCOTROUGH  --   --   --   --   --   --   --   --  13*  VANCOPEAK  --   --   --  26*   < > 23*  --  30  --    < > = values in this interval not displayed.    Estimated Creatinine Clearance: 137.3 mL/min (by C-G formula based on SCr of 0.8 mg/dL).    No Known Allergies  10/29/19, PharmD, BCPS Clinical Pharmacist Phone: [phone]

## 2019-10-28 NOTE — Progress Notes (Signed)
Physical Therapy Treatment Patient Details Name: Cory Palmer MRN: [id number] DOB: 07/09/1961 Today's Date: 10/28/2019    History of Present Illness Cory Palmer 59 year old male s/p L BKA 10/23/2019. PMH significant for right BKA, DM, liver cirrhosis, Afib,  and bipolar disorder.    PT Comments    Pt pleasant and remains adamant that he will not go to SNF no matter the medical need. Pt able to progress to lateral transfers with minguard assist and ultimately could return home with max HH services at [facility] level. 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 Pt educated for amputee exercises, mobility and safety.   Of note: wound VAC tubing not attached to sponge and RN aware    Follow Up Recommendations  Home health PT;Supervision - Intermittent(CIR declined and pt refuses SNF)     Equipment Recommendations  Wheelchair (measurements PT);Wheelchair cushion (measurements PT);Other (comment)(tub bench)    Recommendations for Other Services       Precautions / Restrictions Precautions Precautions: Fall Precaution Comments: wound VAC Restrictions Weight Bearing Restrictions: Yes Other Position/Activity Restrictions: NWB L LE    Mobility  Bed Mobility Overal bed mobility: Modified Independent Bed Mobility: Rolling;Supine to Sit Rolling: Modified independent (Device/Increase time)   Supine to sit: Modified independent (Device/Increase time)        Transfers Overall transfer level: Needs assistance   Transfers: Lateral/Scoot Transfers          Lateral/Scoot Transfers: Supervision General transfer comment: assist for setup with cues to sequence. pt able to scoot posteriorly toward [facility] then laterally to drop arm recliner with cues for trunk positioning and sequence  Ambulation/Gait                 Stairs             Wheelchair Mobility    Modified Rankin (Stroke Patients Only)       Balance     Sitting  balance-Leahy Scale: Fair                                      Cognition Arousal/Alertness: Awake/alert Behavior During Therapy: WFL for tasks assessed/performed Overall Cognitive Status: Impaired/Different from baseline Area of Impairment: Problem solving;Safety/judgement                         Safety/Judgement: Decreased awareness of deficits;Decreased awareness of safety   Problem Solving: Slow processing        Exercises Amputee Exercises Hip Extension: AROM;Left;Sidelying;20 reps Knee Extension: AROM;Both;Seated;20 reps Straight Leg Raises: AROM;Left;Seated;20 reps Chair Push Up: AROM;Both;10 reps    General Comments        Pertinent Vitals/Pain Pain Score: 7  Pain Location: L residual limb Pain Descriptors / Indicators: Discomfort;Aching;Nagging Pain Intervention(s): Limited activity within patient's tolerance;Monitored during session;Repositioned;Patient requesting pain meds-RN notified    Home Living                      Prior Function            PT Goals (current goals can now be found in the care plan section) Progress towards PT goals: Progressing toward goals    Frequency    Min 3X/week      PT Plan      Co-evaluation  AM-PAC PT "6 Clicks" Mobility   Outcome Measure  Help needed turning from your back to your side while in a flat bed without using bedrails?: None Help needed moving from lying on your back to sitting on the side of a flat bed without using bedrails?: A Little Help needed moving to and from a bed to a chair (including a wheelchair)?: A Little Help needed standing up from a chair using your arms (e.g., wheelchair or bedside chair)?: Total Help needed to walk in hospital room?: Total Help needed climbing 3-5 steps with a railing? : Total 6 Click Score: 13    End of Session   Activity Tolerance: Patient tolerated treatment well Patient left: in chair;with call bell/phone  within reach;with chair alarm set Nurse Communication: Mobility status PT Visit Diagnosis: Other abnormalities of gait and mobility (R26.89);Muscle weakness (generalized) (M62.81)     Time: 7408-1448 PT Time Calculation (min) (ACUTE ONLY): 23 min  Charges:  $Therapeutic Exercise: 8-22 mins $Therapeutic Activity: 8-22 mins                     Mailee Klaas P, PT Acute Rehabilitation Services Pager: [phone] Office: [place] 10/28/2019, 1:20 PM

## 2019-10-29 ENCOUNTER — Telehealth: Payer: Self-pay

## 2019-10-29 LAB — COMPREHENSIVE METABOLIC PANEL
ALT: 14 U/L (ref 0–44)
AST: 22 U/L (ref 15–41)
Albumin: 2.2 g/dL — ABNORMAL LOW (ref 3.5–5.0)
Alkaline Phosphatase: 172 U/L — ABNORMAL HIGH (ref 38–126)
Anion gap: 10 (ref 5–15)
BUN: 10 mg/dL (ref 6–20)
CO2: 21 mmol/L — ABNORMAL LOW (ref 22–32)
Calcium: 8.6 mg/dL — ABNORMAL LOW (ref 8.9–10.3)
Chloride: 106 mmol/L (ref 98–111)
Creatinine, Ser: 0.66 mg/dL (ref 0.61–1.24)
GFR calc Af Amer: >60 mL/min (ref >60)
GFR calc non Af Amer: >60 mL/min (ref >60)
Glucose, Bld: 124 mg/dL — ABNORMAL HIGH (ref 70–99)
Potassium: 3.8 mmol/L (ref 3.5–5.1)
Sodium: 137 mmol/L (ref 135–145)
Total Bilirubin: 1.1 mg/dL (ref 0.3–1.2)
Total Protein: 7.3 g/dL (ref 6.5–8.1)

## 2019-10-29 LAB — CBC WITH DIFFERENTIAL/PLATELET
Abs Immature Granulocytes: 0.07 10*3/uL (ref 0.00–0.07)
Basophils Absolute: 0.1 10*3/uL (ref 0.0–0.1)
Basophils Relative: 1 %
Eosinophils Absolute: 0.4 10*3/uL (ref 0.0–0.5)
Eosinophils Relative: 5 %
HCT: 30.8 % — ABNORMAL LOW (ref 39.0–52.0)
Hemoglobin: 9.8 g/dL — ABNORMAL LOW (ref 13.0–17.0)
Immature Granulocytes: 1 %
Lymphocytes Relative: 19 %
Lymphs Abs: 1.4 10*3/uL (ref 0.7–4.0)
MCH: 30 pg (ref 26.0–34.0)
MCHC: 31.8 g/dL (ref 30.0–36.0)
MCV: 94.2 fL (ref 80.0–100.0)
Monocytes Absolute: 0.5 10*3/uL (ref 0.1–1.0)
Monocytes Relative: 7 %
Neutro Abs: 4.9 10*3/uL (ref 1.7–7.7)
Neutrophils Relative %: 67 %
Platelets: 217 10*3/uL (ref 150–400)
RBC: 3.27 MIL/uL — ABNORMAL LOW (ref 4.22–5.81)
RDW: 15.4 % (ref 11.5–15.5)
WBC: 7.3 10*3/uL (ref 4.0–10.5)
nRBC: 0 % (ref 0.0–0.2)

## 2019-10-29 LAB — PHOSPHORUS: Phosphorus: 3.1 mg/dL (ref 2.5–4.6)

## 2019-10-29 LAB — GLUCOSE, CAPILLARY
Glucose-Capillary: 136 mg/dL — ABNORMAL HIGH (ref 70–99)
Glucose-Capillary: 121 mg/dL — ABNORMAL HIGH (ref 70–99)
Glucose-Capillary: 123 mg/dL — ABNORMAL HIGH (ref 70–99)
Glucose-Capillary: 159 mg/dL — ABNORMAL HIGH (ref 70–99)

## 2019-10-29 LAB — MAGNESIUM: Magnesium: 1.7 mg/dL (ref 1.7–2.4)

## 2019-10-29 NOTE — Telephone Encounter (Signed)
error 

## 2019-10-29 NOTE — Progress Notes (Signed)
Doing well. 0cc in VAC  Vac removed. Minimal bleeding VIVE shrinker applied. Wound edges well approximated. No necrosis or cellulitis. Begin daily dressing changes

## 2019-10-29 NOTE — Progress Notes (Signed)
PROGRESS NOTE    Cory Palmer  JEH:[id number] DOB: Jan 22, 1961 DOA: 10/22/2019 PCP: Patient, [name] Per   Brief Narrative: Cory Palmer is  Grosser 59 y.o. male with medical history significant for bipolar disorder, liver cirrhosis, type 2 diabetes mellitus, recent admission with osteomyelitis, MRSA bacteremia with left foot with suspected source, and was planned for surgery left AMA on 10/21/2019 now returns requesting resumption of his care.  Patient reports that he was able to attend to some business that had required to leave AMA, is now dedicated to treatment of his infection, and states that his brother is planning to come from out of town to assist him in his recovery.  Patient denies any fevers or chills, has not been taking any medications since leaving the hospital, and denies any chest pain, abdominal pain, cough, or shortness of breath.  He continues to have some bloody and purulent drainage from the left foot.  ED Course: Upon arrival to the ED, patient is found to be afebrile, saturating, stable blood pressure.  Chemistry panel with sodium 132 her bicarbonate is 19.  CBC with leukocytosis to 10,900 and stable normocytic anemia with hemoglobin 8.5.  Covid PCR screening test has been ordered hospitalists consulted for admission.  Assessment & Plan:   Principal Problem:   MRSA bacteremia Active Problems:   Diabetes mellitus type II, non insulin dependent (HCC)   S/P transmetatarsal amputation of foot, left (HCC)   Depressed bipolar disorder (HCC)   PAF (paroxysmal atrial fibrillation) (HCC)   Osteomyelitis of left foot (HCC)   Hyponatremia  1. MRSA bacteremia; left foot osteomyelitis  - Patient was admitted with worsening foot wound on 3/20 - plain films concerning for osteomyelitis - blood culture from 3/20 growing MRSA - repeat blood cx 3/22 NGTD  - echo 3/22 -> EF 60-65% - evidence of atrial level shunting.   - TEE 3/26 without vegetations - Continue vancomycin.  ID  recommending 12 more days from 3/26.  Therapy will be completed on April 7th.  Unasyn now d/c'd after L BKA. - ID c/s, appreciate recs - Ortho c/s, s/p transtibial amputation on 3/24 - recommends f/u in 1 week - Dispo now main issue, difficult to get patient buy in for SNF or CIR -> ID recommending to keep in house with supervised medical therapy and ongoing rehab if he's not agreeable to SNF or CIR.  At this time, will plan to keep Cory Palmer here to complete therapy.  2. Paroxysmal atrial fibrillation  - Not anticoagulated prior to admission, CHADS-VASc may only be 1 (DM), but also poor candidate due to chronic anemia and poor adherence   3. Type II DM  - A1c was 6,8% earlier this month  - Check CBGs and use Frandy Basnett low-intensity SSI for now    4. Hyponatremia  - Serum sodium is 132 in ED, similar to priors, appears euvolemic, likely related to cirrhosis, will monitor    # Hypomagnesemia: replace and follow  5. Bipolar disorder  - Continue Seroquel, Zoloft, trazodone    6. Anemia  Iron Def Anemia - Trend H/H - stable - iron, b12, folate, ferritin - suggestive of iron def anemia - Patient denies melena or hematochezia; likely related to cirrhosis, chronic infection  - Needs f/u for IDA, CRC screening if not done  7. Cirrhosis  - Appears compensated   DVT prophylaxis: SCD Code Status: full Family Communication: brother at bedside Disposition Plan:  . Patient came from: home            .  Anticipated d/c place:home . Barriers to d/c OR conditions which need to be met to effect Ladale Sherburn safe d/c: pending TEE, clearance by ortho   Consultants:   orthopedics  Procedures:  3/24 - s/p L transtibial amputation  Echo IMPRESSIONS    1. Normal LV function; trace AI; mild MR and TR; moderate LAE; no  vegetatioins.  2. Left ventricular ejection fraction, by estimation, is 55 to 60%. The  left ventricle has normal function. The left ventricle has no regional  wall motion  abnormalities.  3. Right ventricular systolic function is normal. The right ventricular  size is normal.  4. Left atrial size was moderately dilated. No left atrial/left atrial  appendage thrombus was detected.  5. The mitral valve is normal in structure. Mild mitral valve  regurgitation.  6. The aortic valve is tricuspid. Aortic valve regurgitation is trivial.  Mild aortic valve sclerosis is present, with no evidence of aortic valve  stenosis.  7. There is mild (Grade II) plaque involving the descending aorta.   Antimicrobials:  Anti-infectives (From admission, onward)   Start     Dose/Rate Route Frequency Ordered Stop   10/27/19 0000  vancomycin (VANCOREADY) IVPB 1250 mg/250 mL     1,250 mg 166.7 mL/hr over 90 Minutes Intravenous Every 12 hours 10/26/19 1425     10/23/19 1400  vancomycin (VANCOREADY) IVPB 1250 mg/250 mL  Status:  Discontinued     1,250 mg 166.7 mL/hr over 90 Minutes Intravenous Every 12 hours 10/23/19 0129 10/26/19 1425   10/23/19 1030  ceFAZolin (ANCEF) IVPB 2g/100 mL premix  Status:  Discontinued     2 g 200 mL/hr over 30 Minutes Intravenous On call to O.R. 10/23/19 1026 10/23/19 1412   10/23/19 1000  Ampicillin-Sulbactam (UNASYN) 3 g in sodium chloride 0.9 % 100 mL IVPB  Status:  Discontinued     3 g 200 mL/hr over 30 Minutes Intravenous Every 6 hours 10/23/19 0858 10/24/19 1239   10/23/19 0130  vancomycin (VANCOREADY) IVPB 2000 mg/400 mL     2,000 mg 200 mL/hr over 120 Minutes Intravenous  Once 10/23/19 0128 10/23/19 0630     Subjective: Tired this morning No new complaints  Objective: Vitals:   10/28/19 1916 10/29/19 0300 10/29/19 0750 10/29/19 1327  BP: (!) 112/59 126/81 102/66 102/68  Pulse: (!) 58 66 62 64  Resp: _0 Temp: 97.8 F (36.6 C) 98 F (36.7 C) 97.9 F (36.6 C) (!) 97.5 F (36.4 C)  TempSrc: Oral Oral Oral Oral  SpO2: 97% 96% 93% 98%  Weight:      Height:        Intake/Output Summary (Last 24 hours) at 10/29/2019  1333 Last data filed at 10/29/2019 0500 Gross per 24 hour  Intake 120 ml  Output 850 ml  Net -730 ml   Filed Weights   10/22/19 2329 10/25/19 0734  Weight: 113.9 kg 113.9 kg    Examination:  General: No acute distress. Cardiovascular: Heart sounds show Jessalyn Hinojosa regular rate, and rhythm.  Lungs: Clear to auscultation bilaterally Abdomen: Soft, nontender, nondistended Neurological: Alert and oriented 3. Moves all extremities 4 . Cranial nerves II through XII grossly intact. Skin: Warm and dry. No rashes or lesions. Extremities: No clubbing or cyanosis. No edema.   Data Reviewed: I have personally reviewed following labs and imaging studies  CBC: Recent Labs  Lab 10/24/19 0440 10/24/19 0440 10/25/19 0403 10/26/19 0521 10/27/19 0637 10/28/19 0503 10/29/19 0416  WBC 5.6   < >  6.7 7.6 7.1 8.1 7.3  NEUTROABS 3.7  --   --  5.2 4.7 6.0 4.9  HGB 7.8*   < > 8.9* 8.8* 9.7* 9.4* 9.8*  HCT 24.5*   < > 27.9* 27.8* 30.5* 29.8* 30.8*  MCV 93.9   < > 94.6 95.2 95.0 95.8 94.2  PLT 196   < > 214 227 219 242 217   < > = values in this interval not displayed.   Basic Metabolic Panel: Recent Labs  Lab 10/25/19 0403 10/26/19 0521 10/27/19 0637 10/28/19 0503 10/29/19 0416  NA 138 137 138 138 137  K 4.0 4.1 3.9 4.1 3.8  CL 107 107 110 105 106  CO2 21* 21* 20* 21* 21*  GLUCOSE 163* 142* 117* 142* 124*  BUN 5* _0 CREATININE 0.83 0.65 0.80 0.65 0.66  CALCIUM 7.9* 8.1* 8.2* 8.3* 8.6*  MG 1.6* 1.9 1.6* 1.8 1.7  PHOS 3.3 3.4 3.4 3.2 3.1   GFR: Estimated Creatinine Clearance: 137.3 mL/min (by C-G formula based on SCr of 0.66 mg/dL). Liver Function Tests: Recent Labs  Lab 10/25/19 0403 10/26/19 0521 10/27/19 0637 10/28/19 0503 10/29/19 0416  AST _1 ALT _2 ALKPHOS 185* 157* 169* 163* 172*  BILITOT 1.1 1.2 1.3* 1.2 1.1  PROT 6.7 6.7 7.4 7.2 7.3  ALBUMIN 2.0* 2.0* 2.1* 2.2* 2.2*   No results for input(s): LIPASE, AMYLASE in the last 168 hours. No  results for input(s): AMMONIA in the last 168 hours. Coagulation Profile: Recent Labs  Lab 10/23/19 0532  INR 1.4*   Cardiac Enzymes: No results for input(s): CKTOTAL, CKMB, CKMBINDEX, TROPONINI in the last 168 hours. BNP (last 3 results) No results for input(s): PROBNP in the last 8760 hours. HbA1C: No results for input(s): HGBA1C in the last 72 hours. CBG: Recent Labs  Lab 10/28/19 1133 10/28/19 1722 10/28/19 2042 10/29/19 0637 10/29/19 1112  GLUCAP 132* 125* 171* 136* 121*   Lipid Profile: No results for input(s): CHOL, HDL, LDLCALC, TRIG, CHOLHDL, LDLDIRECT in the last 72 hours. Thyroid Function Tests: No results for input(s): TSH, T4TOTAL, FREET4, T3FREE, THYROIDAB in the last 72 hours. Anemia Panel: No results for input(s): VITAMINB12, FOLATE, FERRITIN, TIBC, IRON, RETICCTPCT in the last 72 hours. Sepsis Labs: No results for input(s): PROCALCITON, LATICACIDVEN in the last 168 hours.  Recent Results (from the past 240 hour(s))  Blood culture (routine x 2)     Status: Abnormal   Collection Time: 10/19/19  2:51 PM   Specimen: BLOOD RIGHT FOREARM  Result Value Ref Range Status   Specimen Description   Final    BLOOD RIGHT FOREARM Performed at [facility], [address], [place], [place] [address]    Special Requests   Final    BOTTLES DRAWN AEROBIC AND ANAEROBIC Blood Culture adequate volume Performed at [place] [address] [place], [place] [address]    Culture  Setup Time   Final    GRAM POSITIVE COCCI IN CLUSTERS IN BOTH AEROBIC AND ANAEROBIC BOTTLES CRITICAL RESULT CALLED TO, READ BACK BY AND VERIFIED WITH: [place] [id number] AT 0947 BY CM Performed at [facility], [place] [address] [place], [place] [address]    Culture METHICILLIN RESISTANT STAPHYLOCOCCUS AUREUS ([place] Morren)  Final   Report Status 10/22/2019 FINAL  Final   Organism ID, Bacteria METHICILLIN RESISTANT STAPHYLOCOCCUS AUREUS  Final      Susceptibility  Methicillin resistant staphylococcus aureus - MIC*    CIPROFLOXACIN 4 RESISTANT Resistant     ERYTHROMYCIN >=8 RESISTANT Resistant     GENTAMICIN <=0.5 SENSITIVE Sensitive     OXACILLIN >=4 RESISTANT Resistant     TETRACYCLINE >=16 RESISTANT Resistant     VANCOMYCIN <=0.5 SENSITIVE Sensitive     TRIMETH/SULFA <=10 SENSITIVE Sensitive     CLINDAMYCIN <=0.25 SENSITIVE Sensitive     RIFAMPIN <=0.5 SENSITIVE Sensitive     Inducible Clindamycin NEGATIVE Sensitive     * METHICILLIN RESISTANT STAPHYLOCOCCUS AUREUS  Respiratory Panel by RT PCR (Flu Deshanna Kama&B, Covid) - Nasopharyngeal Swab     Status: None   Collection Time: 10/19/19  8:07 PM   Specimen: Nasopharyngeal Swab  Result Value Ref Range Status   SARS Coronavirus 2 by RT PCR NEGATIVE NEGATIVE Final    Comment: (NOTE) SARS-CoV-2 target nucleic acids are NOT DETECTED. The SARS-CoV-2 RNA is generally detectable in upper respiratoy specimens during the acute phase of infection. The lowest concentration of SARS-CoV-2 viral copies this assay can detect is 131 copies/mL. Blannie Shedlock negative result does not preclude SARS-Cov-2 infection and should not be used as the sole basis for treatment or other patient management decisions. Harman Ferrin negative result may occur with  improper specimen collection/handling, submission of specimen other than nasopharyngeal swab, presence of viral mutation(s) within the areas targeted by this assay, and inadequate number of viral copies (<131 copies/mL). Amerigo Mcglory negative result must be combined with clinical observations, patient history, and epidemiological information. The expected result is Negative. Fact Sheet for Patients:  PinkCheek.be Fact Sheet for Healthcare Providers:  GravelBags.it This test is not yet ap proved or cleared by the [place] FDA and  has been authorized for detection and/or diagnosis of SARS-CoV-2 by FDA under an Emergency Use Authorization (EUA).  This EUA will remain  in effect (meaning this test can be used) for the duration of the COVID-19 declaration under Section 564(b)(1) of the Act, 21 U.S.C. section 360bbb-3(b)(1), unless the authorization is terminated or revoked sooner.    Influenza Coolidge Gossard by PCR NEGATIVE NEGATIVE Final   Influenza B by PCR NEGATIVE NEGATIVE Final    Comment: (NOTE) The Xpert Xpress SARS-CoV-2/FLU/RSV assay is intended as an aid in  the diagnosis of influenza from Nasopharyngeal swab specimens and  should not be used as [place] Mas[place] sole basis for treatment. Nasal washings and  aspirates are unacceptable for Xpert Xpress SARS-CoV-2/FLU/RSV  testing. Fact Sheet for Patients: PinkCheek.be Fact Sheet for Healthcare Providers: GravelBags.it This test is not yet approved or cleared by the [place] FDA and  has been authorized for detection and/or diagnosis of SARS-CoV-2 by  FDA under an Emergency Use Authorization (EUA). This EUA will remain  in effect (meaning this test can be used) for the duration of the  Covid-19 declaration under Section 564(b)(1) of the Act, 21  U.S.C. section 360bbb-3(b)(1), unless the authorization is  terminated or revoked. Performed at [facility], [place] [address] [place], [place] [address]   Blood culture (routine x 2)     Status: None   Collection Time: 10/20/19  5:04 AM   Specimen: BLOOD  Result Value Ref Range Status   Specimen Description   Final    BLOOD LEFT ANTECUBITAL Performed at [facility] [address], [place], [place] [address]    Special Requests   Final    BOTTLES DRAWN AEROBIC ONLY Blood Culture adequate volume Performed at [facility] [address]  [place], [place] [address]    Culture   Final    NO GROWTH 5 DAYS Performed at [facility], [place] [address] [place], [place] [address]    Report Status 10/25/2019 FINAL  Final  Culture,  blood (Routine X 2) w Reflex to ID Panel     Status: None   Collection Time: 10/21/19  4:35 PM   Specimen: BLOOD LEFT HAND  Result Value Ref Range Status   Specimen Description BLOOD LEFT HAND  Final   Special Requests   Final    BOTTLES DRAWN AEROBIC ONLY Blood Culture adequate volume   Culture   Final    NO GROWTH 5 DAYS Performed at [facility], [place] [address] [place], [place] [address]    Report Status 10/26/2019 FINAL  Final  Culture, blood (Routine X 2) w Reflex to ID Panel     Status: None   Collection Time: 10/21/19  4:35 PM   Specimen: BLOOD LEFT HAND  Result Value Ref Range Status   Specimen Description BLOOD LEFT HAND  Final   Special Requests   Final    BOTTLES DRAWN AEROBIC AND ANAEROBIC Blood Culture adequate volume   Culture   Final    NO GROWTH 5 DAYS Performed at [facility], [place] [address], [place], [place] [address]    Report Status 10/26/2019 FINAL  Final  SARS CORONAVIRUS 2 (TAT 6-24 HRS) Nasopharyngeal Nasopharyngeal Swab     Status: None   Collection Time: 10/23/19  1:30 AM   Specimen: Nasopharyngeal Swab  Result Value Ref Range Status   SARS Coronavirus 2 NEGATIVE NEGATIVE Final    Comment: (NOTE) SARS-CoV-2 target nucleic acids are NOT DETECTED. The SARS-CoV-2 RNA is generally detectable in upper and lower respiratory specimens during the acute phase of infection. Negative results do not preclude SARS-CoV-2 infection, do not rule out co-infections with other pathogens, and should not be used as the sole basis for treatment or other patient management decisions. Negative results must be combined with clinical observations, patient history, and epidemiological information. The expected result is Negative. Fact Sheet for Patients: SugarRoll.be Fact Sheet for Healthcare Providers: https://www.woods-mathews.com/ This test is not yet approved or cleared by the [place] FDA and  has been  authorized for detection and/or diagnosis of SARS-CoV-2 by FDA under an Emergency Use Authorization (EUA). This EUA will remain  in effect (meaning this test can be used) for the duration of the COVID-19 declaration under Section 56 4(b)(1) of the Act, 21 U.S.C. section 360bbb-3(b)(1), unless the authorization is terminated or revoked sooner. Performed at [facility], [place] [address], [place], [place] [address]   Surgical pcr screen     Status: Abnormal   Collection Time: 10/23/19  3:27 AM   Specimen: Nasal Mucosa; Nasal Swab  Result Value Ref Range Status   MRSA, PCR POSITIVE (Sharian Delia) NEGATIVE Final    Comment: RESULT CALLED TO, READ BACK BY AND VERIFIED WITH: COLLIE,L RN [phone] 10/23/2019 MITCHELL,L    Staphylococcus aureus POSITIVE (Mekhi Sonn) NEGATIVE Final    Comment: (NOTE) The Xpert SA Assay (FDA approved for NASAL specimens in patients 78 years of age and older), is one component of Kimiko Common comprehensive surveillance program. It is not intended to diagnose infection nor to guide or monitor treatment.          Radiology Studies: No results found.      Scheduled Meds: . docusate sodium  100 mg Oral BID  . enoxaparin (LOVENOX)  injection  40 mg Subcutaneous Q24H  . ferrous sulfate  325 mg Oral Q breakfast  . insulin aspart  0-5 Units Subcutaneous QHS  . insulin aspart  0-9 Units Subcutaneous TID WC  . QUEtiapine  300 mg Oral QHS  . sertraline  50 mg Oral Daily  . sodium chloride flush  10-40 mL Intracatheter Q12H  . traZODone  150 mg Oral QHS   Continuous Infusions: . sodium chloride 10 mL/hr at 10/26/19 2343  . lactated ringers 500 mL/hr at 10/23/19 1329  . vancomycin 1,250 mg (10/29/19 1220)     LOS: 6 days    Time spent: over 30 min    Fayrene Helper, MD [facility]   To contact the attending provider between 7A-7P or the covering provider during after hours 7P-7A, please log into the web site [email or web address] and access using universal [place]  password for that web site. If you do not have the password, please call the hospital operator.  10/29/2019, 1:33 PM

## 2019-10-29 NOTE — Plan of Care (Signed)
  Problem: Pain Managment: Goal: General experience of comfort will improve Outcome: Progressing   Problem: Safety: Goal: Ability to remain free from injury will improve Outcome: Progressing   Problem: Skin Integrity: Goal: Risk for impaired skin integrity will decrease Outcome: Progressing   

## 2019-10-30 ENCOUNTER — Inpatient Hospital Stay (HOSPITAL_COMMUNITY): Admission: RE | Admit: 2019-10-30 | Payer: Medicare Other | Source: Home / Self Care | Admitting: Orthopedic Surgery

## 2019-10-30 ENCOUNTER — Encounter (HOSPITAL_COMMUNITY): Admission: RE | Payer: Self-pay | Source: Home / Self Care

## 2019-10-30 DIAGNOSIS — L97521 Non-pressure chronic ulcer of other part of left foot limited to breakdown of skin: Secondary | ICD-10-CM

## 2019-10-30 LAB — COMPREHENSIVE METABOLIC PANEL
ALT: 13 U/L (ref 0–44)
AST: 22 U/L (ref 15–41)
Albumin: 2.5 g/dL — ABNORMAL LOW (ref 3.5–5.0)
Alkaline Phosphatase: 160 U/L — ABNORMAL HIGH (ref 38–126)
Anion gap: 10 (ref 5–15)
BUN: 12 mg/dL (ref 6–20)
CO2: 20 mmol/L — ABNORMAL LOW (ref 22–32)
Calcium: 9.2 mg/dL (ref 8.9–10.3)
Chloride: 107 mmol/L (ref 98–111)
Creatinine, Ser: 0.64 mg/dL (ref 0.61–1.24)
GFR calc Af Amer: >60 mL/min (ref >60)
GFR calc non Af Amer: >60 mL/min (ref >60)
Glucose, Bld: 109 mg/dL — ABNORMAL HIGH (ref 70–99)
Potassium: 3.9 mmol/L (ref 3.5–5.1)
Sodium: 137 mmol/L (ref 135–145)
Total Bilirubin: 1.2 mg/dL (ref 0.3–1.2)
Total Protein: 7.5 g/dL (ref 6.5–8.1)

## 2019-10-30 LAB — PHOSPHORUS: Phosphorus: 3.9 mg/dL (ref 2.5–4.6)

## 2019-10-30 LAB — GLUCOSE, CAPILLARY
Glucose-Capillary: 106 mg/dL — ABNORMAL HIGH (ref 70–99)
Glucose-Capillary: 115 mg/dL — ABNORMAL HIGH (ref 70–99)
Glucose-Capillary: 145 mg/dL — ABNORMAL HIGH (ref 70–99)
Glucose-Capillary: 135 mg/dL — ABNORMAL HIGH (ref 70–99)

## 2019-10-30 LAB — CBC WITH DIFFERENTIAL/PLATELET
Abs Immature Granulocytes: 0.05 10*3/uL (ref 0.00–0.07)
Basophils Absolute: 0.1 10*3/uL (ref 0.0–0.1)
Basophils Relative: 1 %
Eosinophils Absolute: 0.4 10*3/uL (ref 0.0–0.5)
Eosinophils Relative: 5 %
HCT: 32.2 % — ABNORMAL LOW (ref 39.0–52.0)
Hemoglobin: 10.2 g/dL — ABNORMAL LOW (ref 13.0–17.0)
Immature Granulocytes: 1 %
Lymphocytes Relative: 21 %
Lymphs Abs: 1.5 10*3/uL (ref 0.7–4.0)
MCH: 30.1 pg (ref 26.0–34.0)
MCHC: 31.7 g/dL (ref 30.0–36.0)
MCV: 95 fL (ref 80.0–100.0)
Monocytes Absolute: 0.5 10*3/uL (ref 0.1–1.0)
Monocytes Relative: 6 %
Neutro Abs: 4.6 10*3/uL (ref 1.7–7.7)
Neutrophils Relative %: 66 %
Platelets: 194 10*3/uL (ref 150–400)
RBC: 3.39 MIL/uL — ABNORMAL LOW (ref 4.22–5.81)
RDW: 15.7 % — ABNORMAL HIGH (ref 11.5–15.5)
WBC: 7 10*3/uL (ref 4.0–10.5)
nRBC: 0 % (ref 0.0–0.2)

## 2019-10-30 LAB — MAGNESIUM: Magnesium: 1.5 mg/dL — ABNORMAL LOW (ref 1.7–2.4)

## 2019-10-30 SURGERY — AMPUTATION BELOW KNEE
Anesthesia: General | Site: Knee | Laterality: Left

## 2019-10-30 MED ORDER — MAGNESIUM SULFATE 2 GM/50ML IV SOLN
2.0000 g | Freq: Once | INTRAVENOUS | Status: AC
  Administered 2019-10-30: 2 g via INTRAVENOUS
  Filled 2019-10-30: qty 50

## 2019-10-30 NOTE — Progress Notes (Signed)
PROGRESS NOTE  Cory Palmer EVO:[id number] DOB: 10-18-1960 DOA: 10/22/2019 PCP: Patient, [name] Per   LOS: 7 days   Brief Narrative / Interim history: Cory Palmer a 59 y.o.malewith medical history significant forbipolar disorder, liver cirrhosis, type 2 diabetes mellitus, recent admission with osteomyelitis, MRSA bacteremia with left foot with suspected source, and was planned for surgery left AMA on 10/21/2019 now returns requesting resumption of his care.Patient reports that he was able to attend to some business that had required to leave AMA, is now dedicated to treatment of his infection, and states that his brother is planning to come from out of town to assist him in his recovery. Patient denies any fevers or chills, has not been taking any medications since leaving the hospital, and denies any chest pain, abdominal pain, cough, or shortness of breath. He continues to have some bloody and purulent drainage from the left foot.   Subjective / 24h Interval events: Overall doing well, no complaints.  Assessment & Plan: Principal Problem MRSA bacteremia; left foot osteomyelitis -Patient was admitted with worsening foot wound on 3/20, plain films were concerning for osteomyelitis.  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  ID recommended IV antibiotics through April 7, difficult disposition as patient needs intravenous antibiotics and ongoing supervised medical therapy, infectious disease recommended to keep patient hospitalized to complete therapy.  He was not agreeable to SNF for CIR.  Currently on vancomycin.  Blood cultures grew MRSA on 3/20, repeat blood cultures on 3/22 no growth.  Underwent a 2D echo which showed an EF of 60-65% and TEE on 3/26 without vegetations.  Active Problems Paroxysmal atrial fibrillation -Not anticoagulated prior to admission, CHADS-VASc may only be 1 (DM), but also poor candidate due  to chronic anemia and poor adherence  Type II DM -A1c was 6,8% earlier this month -Check CBGs and use a low-intensity SSI for now  CBG (last 3)  Recent Labs    10/29/19 2014 10/30/19 0627 10/30/19 1111  GLUCAP 159* 106* 115*   Hyponatremia -Likely in the setting of liver disease, sodium has now actually normalized  Hypomagnesemia  -Continue to replace today   Bipolar disorder -Continue Seroquel, Zoloft, trazodone  Anemia Iron Def Anemia -Trend H/H - stable -iron, b12, folate, ferritin - suggestive of iron def anemia -Patient denies melena or hematochezia; likely related to cirrhosis, chronic infection  -Needs f/u for IDA, CRC screening if not done  Cirrhosis  -Appears compensated  Scheduled Meds: . docusate sodium  100 mg Oral BID  . enoxaparin (LOVENOX) injection  40 mg Subcutaneous Q24H  . ferrous sulfate  325 mg Oral Q breakfast  . insulin aspart  0-5 Units Subcutaneous QHS  . insulin aspart  0-9 Units Subcutaneous TID WC  . QUEtiapine  300 mg Oral QHS  . sertraline  50 mg Oral Daily  . sodium chloride flush  10-40 mL Intracatheter Q12H  . traZODone  150 mg Oral QHS   Continuous Infusions: . sodium chloride 10 mL/hr at 10/26/19 2343  . lactated ringers 500 mL/hr at 10/23/19 1329  . vancomycin 1,250 mg (10/30/19 1212)   PRN Meds:.acetaminophen **OR** acetaminophen, alum & mag hydroxide-simeth, HYDROmorphone (DILAUDID) injection, oxyCODONE, promethazine **OR** promethazine, senna-docusate, sodium chloride flush  DVT prophylaxis: Lovenox Code Status: Full code Family Communication: d/w patient  Patient admitted from: home Anticipated d/c place: home Barriers to d/c: Needing intravenous antibiotics, no safe discharge plan in place currently  Consultants:  Infectious disease Orthopedic surgery  Procedures:  3/24 - s/p L transtibial amputation  Echo IMPRESSIONS    1. Normal LV function; trace AI; mild MR and TR; moderate LAE; no    vegetatioins.  2. Left ventricular ejection fraction, by estimation, is 55 to 60%. The  left ventricle has normal function. The left ventricle has no regional  wall motion abnormalities.  3. Right ventricular systolic function is normal. The right ventricular  size is normal.  4. Left atrial size was moderately dilated. No left atrial/left atrial  appendage thrombus was detected.  5. The mitral valve is normal in structure. Mild mitral valve  regurgitation.  6. The aortic valve is tricuspid. Aortic valve regurgitation is trivial.  Mild aortic valve sclerosis is present, with no evidence of aortic valve  stenosis.  7. There is mild (Grade II) plaque involving the descending aorta.   Antimicrobials: Vancomycin >>   Objective: Vitals:   10/29/19 1327 10/29/19 1934 10/30/19 0350 10/30/19 0753  BP: 102/68 119/78 112/69 104/68  Pulse: 64 62 64 61  Resp: 17 17 16 18   Temp: (!) 97.5 F (36.4 C) 97.7 F (36.5 C) 98.3 F (36.8 C) 97.7 F (36.5 C)  TempSrc: Oral Oral Oral Oral  SpO2: 98% 96% 95% 94%  Weight:      Height:        Intake/Output Summary (Last 24 hours) at 10/30/2019 1310 Last data filed at 10/30/2019 0756 Gross per 24 hour  Intake --  Output 1600 ml  Net -1600 ml   Filed Weights   10/22/19 2329 10/25/19 0734  Weight: 113.9 kg 113.9 kg    Examination:  Constitutional: NAD Eyes: no scleral icterus ENMT: Mucous membranes are moist.  Neck: normal, supple Respiratory: clear to auscultation bilaterally, no wheezing, no crackles. Cardiovascular: Regular rate and rhythm, no murmurs / rubs / gallops. Abdomen: non distended, no tenderness. Bowel sounds positive.  Musculoskeletal: no clubbing / cyanosis.  Skin: no rashes Neurologic: No focal deficits  Data Reviewed: I have independently reviewed following labs and imaging studies   CBC: Recent Labs  Lab 10/26/19 0521 10/27/19 0637 10/28/19 0503 10/29/19 0416 10/30/19 0500  WBC 7.6 7.1 8.1 7.3 7.0   NEUTROABS 5.2 4.7 6.0 4.9 4.6  HGB 8.8* 9.7* 9.4* 9.8* 10.2*  HCT 27.8* 30.5* 29.8* 30.8* 32.2*  MCV 95.2 95.0 95.8 94.2 95.0  PLT 227 219 242 217 852   Basic Metabolic Panel: Recent Labs  Lab 10/26/19 0521 10/27/19 0637 10/28/19 0503 10/29/19 0416 10/30/19 0500  NA 137 138 138 137 137  K 4.1 3.9 4.1 3.8 3.9  CL 107 110 105 106 107  CO2 21* 20* 21* 21* 20*  GLUCOSE 142* 117* 142* 124* 109*  BUN 6 6 10 10 12   CREATININE 0.65 0.80 0.65 0.66 0.64  CALCIUM 8.1* 8.2* 8.3* 8.6* 9.2  MG 1.9 1.6* 1.8 1.7 1.5*  PHOS 3.4 3.4 3.2 3.1 3.9   Liver Function Tests: Recent Labs  Lab 10/26/19 0521 10/27/19 0637 10/28/19 0503 10/29/19 0416 10/30/19 0500  AST 25 27 26 22 22   ALT 16 15 14 14 13   ALKPHOS 157* 169* 163* 172* 160*  BILITOT 1.2 1.3* 1.2 1.1 1.2  PROT 6.7 7.4 7.2 7.3 7.5  ALBUMIN 2.0* 2.1* 2.2* 2.2* 2.5*   Coagulation Profile: No results for input(s): INR, PROTIME in the last 168 hours. HbA1C: No results for input(s): HGBA1C in the last 72 hours. CBG: Recent Labs  Lab 10/29/19 1112 10/29/19 1615 10/29/19 2014 10/30/19  3903 10/30/19 1111  GLUCAP 121* 123* 159* 106* 115*    Recent Results (from the past 240 hour(s))  Culture, blood (Routine X 2) w Reflex to ID Panel     Status: None   Collection Time: 10/21/19  4:35 PM   Specimen: BLOOD LEFT HAND  Result Value Ref Range Status   Specimen Description BLOOD LEFT HAND  Final   Special Requests   Final    BOTTLES DRAWN AEROBIC ONLY Blood Culture adequate volume   Culture   Final    NO GROWTH 5 DAYS Performed at [facility], [address] [place], [place] [address]    Report Status 10/26/2019 FINAL  Final  Culture, blood (Routine X 2) w Reflex to ID Panel     Status: None   Collection Time: 10/21/19  4:35 PM   Specimen: BLOOD LEFT HAND  Result Value Ref Range Status   Specimen Description BLOOD LEFT HAND  Final   Special Requests   Final    BOTTLES DRAWN AEROBIC AND ANAEROBIC Blood Culture  adequate volume   Culture   Final    NO GROWTH 5 DAYS Performed at [facility], [address], [place], [place] [address]    Report Status 10/26/2019 FINAL  Final  SARS CORONAVIRUS 2 (TAT 6-24 HRS) Nasopharyngeal Nasopharyngeal Swab     Status: None   Collection Time: 10/23/19  1:30 AM   Specimen: Nasopharyngeal Swab  Result Value Ref Range Status   SARS Coronavirus 2 NEGATIVE NEGATIVE Final    Comment: (NOTE) SARS-CoV-2 target nucleic acids are NOT DETECTED. The SARS-CoV-2 RNA is generally detectable in upper and lower respiratory specimens during the acute phase of infection. Negative results do not preclude SARS-CoV-2 infection, do not rule out co-infections with other pathogens, and should not be used as the sole basis for treatment or other patient management decisions. Negative results must be combined with clinical observations, patient history, and epidemiological information. The expected result is Negative. Fact Sheet for Patients: HairSlick.no Fact Sheet for Healthcare Providers: quierodirigir.com This test is not yet approved or cleared by the [place] FDA and  has been authorized for detection and/or diagnosis of SARS-CoV-2 by FDA under an Emergency Use Authorization (EUA). This EUA will remain  in effect (meaning this test can be used) for the duration of the COVID-19 declaration under Section 56 4(b)(1) of the Act, 21 U.S.C. section 360bbb-3(b)(1), unless the authorization is terminated or revoked sooner. Performed at [facility], [address], [place], [place] [address]   Surgical pcr screen     Status: Abnormal   Collection Time: 10/23/19  3:27 AM   Specimen: Nasal Mucosa; Nasal Swab  Result Value Ref Range Status   MRSA, PCR POSITIVE (A) NEGATIVE Final    Comment: RESULT CALLED TO, READ BACK BY AND VERIFIED WITH: COLLIE,L RN [phone] 10/23/2019 MITCHELL,L    Staphylococcus aureus  POSITIVE (A) NEGATIVE Final    Comment: (NOTE) The Xpert SA Assay (FDA approved for NASAL specimens in patients 53 years of age and older), is one component of a comprehensive surveillance program. It is not intended to diagnose infection nor to guide or monitor treatment.      Radiology Studies: No results found.  Pamella Pert, MD, PhD [facility]  Between 7 am - 7 pm I am available, please contact me via Amion or Securechat  Between 7 pm - 7 am I am not available, please contact night coverage MD/APP via Amion

## 2019-10-30 NOTE — Progress Notes (Signed)
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 PMH significant for right BKA, DM, liver cirrhosis, Afib,  and bipolar disorder.    PT Comments    Pt seen for mobility progression and continues to perform bed mobility and lateral scoot transfers without any physical assistance needed. PT emphasized the importance of maintaining knee ROM and pt expressed understanding. PT donned limb protector at end of session as well. Pt would continue to benefit from skilled physical therapy services at this time while admitted and after d/c to address the below listed limitations in order to improve overall safety and independence with functional mobility.    Follow Up Recommendations  Home health PT;Supervision - Intermittent;Other (comment)(CIR declined and pt refusing SNF)     Equipment Recommendations  Wheelchair (measurements PT);Wheelchair cushion (measurements PT);Other (comment)(tub bench)    Recommendations for Other Services       Precautions / Restrictions Precautions Precautions: Fall Required Braces or Orthoses: Other Brace Other Brace: Limb protector Restrictions Weight Bearing Restrictions: Yes LLE Weight Bearing: Non weight bearing    Mobility  Bed Mobility Overal bed mobility: Modified Independent                Transfers Overall transfer level: Needs assistance Equipment used: None Transfers: Lateral/Scoot Transfers          Lateral/Scoot Transfers: Supervision General transfer comment: pt scooting from bed to drop arm recliner chair towards his R side; pt with strong upper body and able to maintain NWB L LE throughout; no physical assistance needed with transfer  Ambulation/Gait                 Stairs             Wheelchair Mobility    Modified Rankin (Stroke Patients Only)        Balance Overall balance assessment: Needs assistance Sitting-balance support: No upper extremity supported Sitting balance-Leahy Scale: Fair                                      Cognition Arousal/Alertness: Awake/alert Behavior During Therapy: WFL for tasks assessed/performed Overall Cognitive Status: Within Functional Limits for tasks assessed                                 General Comments: WFL for general conversation      Exercises Amputee Exercises Quad Sets: AROM;Strengthening;5 reps;Both Gluteal Sets: AROM;Strengthening;5 reps Hip ABduction/ADduction: Left;AROM;Strengthening;10 reps Knee Flexion: AROM;Strengthening;Left;5 reps Knee Extension: AROM;Strengthening;Left;5 reps Chair Push Up: AROM;Strengthening;Both;5 reps    General Comments        Pertinent Vitals/Pain Pain Assessment: Faces Faces Pain Scale: Hurts even more Pain Location: L residual limb Pain Descriptors / Indicators: Grimacing;Guarding Pain Intervention(s): Monitored during session;Repositioned    Home Living                      Prior Function            PT Goals (current goals can now be found in the care plan section) Acute Rehab PT Goals PT Goal Formulation: With patient Time For Goal Achievement: 11/07/19 Potential to Achieve Goals: Fair Progress towards PT goals: Progressing toward goals    Frequency  Min 3X/week      PT Plan Current plan remains appropriate    Co-evaluation              AM-PAC PT "6 Clicks" Mobility   Outcome Measure  Help needed turning from your back to your side while in a flat bed without using bedrails?: None Help needed moving from lying on your back to sitting on the side of a flat bed without using bedrails?: None Help needed moving to and from a bed to a chair (including a wheelchair)?: A Little Help needed standing up from a chair using your arms (e.g., wheelchair or bedside chair)?: Total Help  needed to walk in hospital room?: Total Help needed climbing 3-5 steps with a railing? : Total 6 Click Score: 14    End of Session Equipment Utilized During Treatment: Gait belt Activity Tolerance: Patient tolerated treatment well Patient left: in chair;with call bell/phone within reach;with chair alarm set Nurse Communication: Mobility status PT Visit Diagnosis: Other abnormalities of gait and mobility (R26.89);Muscle weakness (generalized) (M62.81)     Time: 7124-5809 PT Time Calculation (min) (ACUTE ONLY): 18 min  Charges:  $Therapeutic Activity: 8-22 mins                     Arletta Bale, DPT  Acute Rehabilitation Services Pager [phone] Office [phone]     [name] 10/30/2019, 2:11 PM

## 2019-10-30 NOTE — Plan of Care (Signed)
  Problem: Education: Goal: Knowledge of General Education information will improve Description: Including pain rating scale, medication(s)/side effects and non-pharmacologic comfort measures Outcome: Progressing   Problem: Clinical Measurements: Goal: Cardiovascular complication will be avoided Outcome: Progressing   Problem: Activity: Goal: Risk for activity intolerance will decrease Outcome: Progressing   Problem: Nutrition: Goal: Adequate nutrition will be maintained Outcome: Progressing   Problem: Coping: Goal: Level of anxiety will decrease Outcome: Progressing   

## 2019-10-30 NOTE — Plan of Care (Signed)
  Problem: Clinical Measurements: Goal: Will remain free from infection Outcome: Progressing   Problem: Nutrition: Goal: Adequate nutrition will be maintained Outcome: Progressing   Problem: Coping: Goal: Level of anxiety will decrease Outcome: Progressing   Problem: Pain Managment: Goal: General experience of comfort will improve Outcome: Progressing   

## 2019-10-30 NOTE — Progress Notes (Signed)
Pharmacy Antibiotic Note  Cory Palmer is a 59 y.o. male admitted on 10/22/2019 with MRSA bacteremia/osteomyelitis.  Pharmacy has been consulted for Vancomycin dosing. WBC is WNL and stable. Renal function remains stable.   AUC 3/28 was therapeutic at 501. WBC stable. SCr stable. Afebrile.   Plan: Continue Vancomycin 1250 mg IV q12h End date set for 4/7 SCr every 72 hours while on therapy Re-check levels as needed - will plan for weekly unless SCr change (next Sunday 4/4)  Height: 6\' 4"  (193 cm) Weight: 251 lb (113.9 kg) IBW/kg (Calculated) : 86.8  Temp (24hrs), Avg:97.9 F (36.6 C), Min:97.7 F (36.5 C), Max:98.3 F (36.8 C)  Recent Labs  Lab 10/26/19 0521 10/26/19 0521 10/26/19 1708 10/27/19 0637 10/27/19 1427 10/27/19 2325 10/28/19 0503 10/29/19 0416 10/30/19 0500  WBC 7.6  --   --  7.1  --   --  8.1 7.3 7.0  CREATININE 0.65  --   --  0.80  --   --  0.65 0.66 0.64  VANCOTROUGH  --   --   --   --   --  13*  --   --   --   VANCOPEAK 26*   < > 23*  --  30  --   --   --   --    < > = values in this interval not displayed.    Estimated Creatinine Clearance: 137.3 mL/min (by C-G formula based on SCr of 0.64 mg/dL).    No Known Allergies  11/01/19, PharmD, BCPS, BCCCP Clinical Pharmacist Please refer to [facility] for [facility] Pharmacy numbers 10/30/2019, 2:27 PM

## 2019-10-31 LAB — CBC
HCT: 32.2 % — ABNORMAL LOW (ref 39.0–52.0)
Hemoglobin: 10.3 g/dL — ABNORMAL LOW (ref 13.0–17.0)
MCH: 30.5 pg (ref 26.0–34.0)
MCHC: 32 g/dL (ref 30.0–36.0)
MCV: 95.3 fL (ref 80.0–100.0)
Platelets: 191 10*3/uL (ref 150–400)
RBC: 3.38 MIL/uL — ABNORMAL LOW (ref 4.22–5.81)
RDW: 15.6 % — ABNORMAL HIGH (ref 11.5–15.5)
WBC: 6.6 10*3/uL (ref 4.0–10.5)
nRBC: 0 % (ref 0.0–0.2)

## 2019-10-31 LAB — COMPREHENSIVE METABOLIC PANEL
ALT: 14 U/L (ref 0–44)
AST: 23 U/L (ref 15–41)
Albumin: 2.4 g/dL — ABNORMAL LOW (ref 3.5–5.0)
Alkaline Phosphatase: 158 U/L — ABNORMAL HIGH (ref 38–126)
Anion gap: 10 (ref 5–15)
BUN: 13 mg/dL (ref 6–20)
CO2: 20 mmol/L — ABNORMAL LOW (ref 22–32)
Calcium: 8.6 mg/dL — ABNORMAL LOW (ref 8.9–10.3)
Chloride: 109 mmol/L (ref 98–111)
Creatinine, Ser: 0.64 mg/dL (ref 0.61–1.24)
GFR calc Af Amer: >60 mL/min (ref >60)
GFR calc non Af Amer: >60 mL/min (ref >60)
Glucose, Bld: 139 mg/dL — ABNORMAL HIGH (ref 70–99)
Potassium: 4 mmol/L (ref 3.5–5.1)
Sodium: 139 mmol/L (ref 135–145)
Total Bilirubin: 1.2 mg/dL (ref 0.3–1.2)
Total Protein: 7.3 g/dL (ref 6.5–8.1)

## 2019-10-31 LAB — GLUCOSE, CAPILLARY
Glucose-Capillary: 100 mg/dL — ABNORMAL HIGH (ref 70–99)
Glucose-Capillary: 142 mg/dL — ABNORMAL HIGH (ref 70–99)
Glucose-Capillary: 171 mg/dL — ABNORMAL HIGH (ref 70–99)
Glucose-Capillary: 110 mg/dL — ABNORMAL HIGH (ref 70–99)

## 2019-10-31 NOTE — Plan of Care (Signed)
  Problem: Education: Goal: Knowledge of General Education information will improve Description Including pain rating scale, medication(s)/side effects and non-pharmacologic comfort measures Outcome: Progressing   

## 2019-10-31 NOTE — Progress Notes (Signed)
PROGRESS NOTE  Cory Palmer OZH:[id number] DOB: 1960-12-06 DOA: 10/22/2019 PCP: Patient, [name] Per   LOS: 8 days   Brief Narrative / Interim history: Cory Palmer a 59 y.o.malewith medical history significant forbipolar disorder, liver cirrhosis, type 2 diabetes mellitus, recent admission with osteomyelitis, MRSA bacteremia with left foot with suspected source, and was planned for surgery left AMA on 10/21/2019 now returns requesting resumption of his care.Patient reports that he was able to attend to some business that had required to leave AMA, is now dedicated to treatment of his infection, and states that his brother is planning to come from out of town to assist him in his recovery. Patient denies any fevers or chills, has not been taking any medications since leaving the hospital, and denies any chest pain, abdominal pain, cough, or shortness of breath. He continues to have some bloody and purulent drainage from the left foot.   Subjective / 24h Interval events: Overall doing well, no complaints.  Assessment & Plan: Principal Problem MRSA bacteremia; left foot osteomyelitis -Patient was admitted with worsening foot wound on 3/20, plain films were concerning for osteomyelitis.  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  ID recommended IV antibiotics through April 7, difficult disposition as patient needs intravenous antibiotics and ongoing supervised medical therapy, infectious disease recommended to keep patient hospitalized to complete therapy.  He was not agreeable to SNF for CIR.  Currently on vancomycin.  Blood cultures grew MRSA on 3/20, repeat blood cultures on 3/22 no growth.  Underwent a 2D echo which showed an EF of 60-65% and TEE on 3/26 without vegetations.  Stable today  Active Problems Paroxysmal atrial fibrillation -Not anticoagulated prior to admission, CHADS-VASc may only be 1 (DM), but also poor  candidate due to chronic anemia and poor adherence  Type II DM -A1c was 6,8% earlier this month -Check CBGs and use a low-intensity SSI for now  CBG (last 3)  Recent Labs    10/30/19 1546 10/30/19 2059 10/31/19 0628  GLUCAP 145* 135* 100*   Hyponatremia -Likely in the setting of liver disease, sodium has now actually normalized  Hypomagnesemia  -Continue to replace today   Bipolar disorder -Continue Seroquel, Zoloft, trazodone  Anemia Iron Def Anemia -Trend H/H - stable -iron, b12, folate, ferritin - suggestive of iron def anemia -Patient denies melena or hematochezia; likely related to cirrhosis, chronic infection  -Needs f/u for IDA, CRC screening if not done  Cirrhosis  -Appears compensated  Scheduled Meds: . docusate sodium  100 mg Oral BID  . enoxaparin (LOVENOX) injection  40 mg Subcutaneous Q24H  . ferrous sulfate  325 mg Oral Q breakfast  . insulin aspart  0-5 Units Subcutaneous QHS  . insulin aspart  0-9 Units Subcutaneous TID WC  . QUEtiapine  300 mg Oral QHS  . sertraline  50 mg Oral Daily  . sodium chloride flush  10-40 mL Intracatheter Q12H  . traZODone  150 mg Oral QHS   Continuous Infusions: . sodium chloride 10 mL/hr at 10/26/19 2343  . lactated ringers 500 mL/hr at 10/23/19 1329  . vancomycin 1,250 mg (10/30/19 2319)   PRN Meds:.acetaminophen **OR** acetaminophen, alum & mag hydroxide-simeth, HYDROmorphone (DILAUDID) injection, oxyCODONE, promethazine **OR** promethazine, senna-docusate, sodium chloride flush  DVT prophylaxis: Lovenox Code Status: Full code Family Communication: d/w patient  Patient admitted from: home Anticipated d/c place: home Barriers to d/c: Needing intravenous antibiotics, no safe discharge plan in place currently  Consultants:  Infectious disease Orthopedic surgery  Procedures:  3/24 - s/p L transtibial amputation  Echo IMPRESSIONS    1. Normal LV function; trace AI; mild MR and TR;  moderate LAE; no  vegetatioins.  2. Left ventricular ejection fraction, by estimation, is 55 to 60%. The  left ventricle has normal function. The left ventricle has no regional  wall motion abnormalities.  3. Right ventricular systolic function is normal. The right ventricular  size is normal.  4. Left atrial size was moderately dilated. No left atrial/left atrial  appendage thrombus was detected.  5. The mitral valve is normal in structure. Mild mitral valve  regurgitation.  6. The aortic valve is tricuspid. Aortic valve regurgitation is trivial.  Mild aortic valve sclerosis is present, with no evidence of aortic valve  stenosis.  7. There is mild (Grade II) plaque involving the descending aorta.   Antimicrobials: Vancomycin >>   Objective: Vitals:   10/30/19 1445 10/30/19 1931 10/31/19 0312 10/31/19 0821  BP: 107/66 121/63 101/67 100/66  Pulse: (!) 58 63 68 (!) 59  Resp: 19 17 16 18   Temp: 97.6 F (36.4 C) 98 F (36.7 C) 97.7 F (36.5 C) 98.2 F (36.8 C)  TempSrc: Oral Oral Oral   SpO2: 98% 93% 96% 93%  Weight:      Height:        Intake/Output Summary (Last 24 hours) at 10/31/2019 1048 Last data filed at 10/31/2019 0824 Gross per 24 hour  Intake 1150 ml  Output 1200 ml  Net -50 ml   Filed Weights   10/22/19 2329 10/25/19 0734  Weight: 113.9 kg 113.9 kg    Examination:  Constitutional: No distress Respiratory: Clear bilaterally without wheezing Cardiovascular: Regular rate and rhythm, no edema  Data Reviewed: I have independently reviewed following labs and imaging studies   CBC: Recent Labs  Lab 10/26/19 0521 10/26/19 0521 10/27/19 0637 10/28/19 0503 10/29/19 0416 10/30/19 0500 10/31/19 0212  WBC 7.6   < > 7.1 8.1 7.3 7.0 6.6  NEUTROABS 5.2  --  4.7 6.0 4.9 4.6  --   HGB 8.8*   < > 9.7* 9.4* 9.8* 10.2* 10.3*  HCT 27.8*   < > 30.5* 29.8* 30.8* 32.2* 32.2*  MCV 95.2   < > 95.0 95.8 94.2 95.0 95.3  PLT 227   < > 219 242 217 194 191   < > =  values in this interval not displayed.   Basic Metabolic Panel: Recent Labs  Lab 10/26/19 0521 10/26/19 0521 10/27/19 10/29/19 10/28/19 0503 10/29/19 0416 10/30/19 0500 10/31/19 0212  NA 137   < > 138 138 137 137 139  K 4.1   < > 3.9 4.1 3.8 3.9 4.0  CL 107   < > 110 105 106 107 109  CO2 21*   < > 20* 21* 21* 20* 20*  GLUCOSE 142*   < > 117* 142* 124* 109* 139*  BUN 6   < > 6 10 10 12 13   CREATININE 0.65   < > 0.80 0.65 0.66 0.64 0.64  CALCIUM 8.1*   < > 8.2* 8.3* 8.6* 9.2 8.6*  MG 1.9  --  1.6* 1.8 1.7 1.5*  --   PHOS 3.4  --  3.4 3.2 3.1 3.9  --    < > = values in this interval not displayed.   Liver Function Tests: Recent Labs  Lab 10/27/19 10/28/19 0503 10/29/19 0416 10/30/19 0500 10/31/19 0212  AST 27 26 22  22  23  ALT 15 14 14 13 14   ALKPHOS 169* 163* 172* 160* 158*  BILITOT 1.3* 1.2 1.1 1.2 1.2  PROT 7.4 7.2 7.3 7.5 7.3  ALBUMIN 2.1* 2.2* 2.2* 2.5* 2.4*   Coagulation Profile: No results for input(s): INR, PROTIME in the last 168 hours. HbA1C: No results for input(s): HGBA1C in the last 72 hours. CBG: Recent Labs  Lab 10/30/19 0627 10/30/19 1111 10/30/19 1546 10/30/19 2059 10/31/19 0628  GLUCAP 106* 115* 145* 135* 100*    Recent Results (from the past 240 hour(s))  Culture, blood (Routine X 2) w Reflex to ID Panel     Status: None   Collection Time: 10/21/19  4:35 PM   Specimen: BLOOD LEFT HAND  Result Value Ref Range Status   Specimen Description BLOOD LEFT HAND  Final   Special Requests   Final    BOTTLES DRAWN AEROBIC ONLY Blood Culture adequate volume   Culture   Final    NO GROWTH 5 DAYS Performed at [facility], [address], [place], [place]    Report Status 10/26/2019 FINAL  Final  Culture, blood (Routine X 2) w Reflex to ID Panel     Status: None   Collection Time: 10/21/19  4:35 PM   Specimen: BLOOD LEFT HAND  Result Value Ref Range Status   Specimen Description BLOOD LEFT HAND  Final   Special Requests   Final     BOTTLES DRAWN AEROBIC AND ANAEROBIC Blood Culture adequate volume   Culture   Final    NO GROWTH 5 DAYS Performed at [facility], [address], [place], [place]    Report Status 10/26/2019 FINAL  Final  SARS CORONAVIRUS 2 (TAT 6-24 HRS) Nasopharyngeal Nasopharyngeal Swab     Status: None   Collection Time: 10/23/19  1:30 AM   Specimen: Nasopharyngeal Swab  Result Value Ref Range Status   SARS Coronavirus 2 NEGATIVE NEGATIVE Final    Comment: (NOTE) SARS-CoV-2 target nucleic acids are NOT DETECTED. The SARS-CoV-2 RNA is generally detectable in upper and lower respiratory specimens during the acute phase of infection. Negative results do not preclude SARS-CoV-2 infection, do not rule out co-infections with other pathogens, and should not be used as the sole basis for treatment or other patient management decisions. Negative results must be combined with clinical observations, patient history, and epidemiological information. The expected result is Negative. Fact Sheet for Patients: 10/25/19 Fact Sheet for Healthcare Providers: HairSlick.no This test is not yet approved or cleared by the quierodirigir.com FDA and  has been authorized for detection and/or diagnosis of SARS-CoV-2 by FDA under an Emergency Use Authorization (EUA). This EUA will remain  in effect (meaning this test can be used) for the duration of the COVID-19 declaration under Section 56 4(b)(1) of the Act, 21 U.S.C. section 360bbb-3(b)(1), unless the authorization is terminated or revoked sooner. Performed at [facility], [address], [place], [place]   Surgical pcr screen     Status: Abnormal   Collection Time: 10/23/19  3:27 AM   Specimen: Nasal Mucosa; Nasal Swab  Result Value Ref Range Status   MRSA, PCR POSITIVE (A) NEGATIVE Final    Comment: RESULT CALLED TO, READ BACK BY AND VERIFIED WITH: COLLIE,L RN [phone]  10/23/2019 MITCHELL,L    Staphylococcus aureus POSITIVE (A) NEGATIVE Final    Comment: (NOTE) The Xpert SA Assay (FDA approved for NASAL specimens in patients 43 years of age and  older), is one component of a comprehensive surveillance program. It is not intended to diagnose infection nor to guide or monitor treatment.      Radiology Studies: No results found.  Marzetta Board, MD, PhD [facility]  Between 7 am - 7 pm I am available, please contact me via Amion or Securechat  Between 7 pm - 7 am I am not available, please contact night coverage MD/APP via Amion

## 2019-10-31 NOTE — Progress Notes (Signed)
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 PMH significant for right BKA, DM, liver cirrhosis, Afib,  and bipolar disorder.   OT comments  Patient continues to make steady progress towards goals in skilled OT session. Patient's session encompassed discussion in regards to equipment needs as pt is now discharging home and transfers to increase independence. Pt continues to progress in transfers, however was unable to stand after donning R prosthetic due to pain in residual LLE. Due to current limitations, pt may need to be discharged at wheelchair level; will continue to follow acutely.    Follow Up Recommendations  Supervision - Intermittent;Home health OT    Equipment Recommendations  Wheelchair (measurements OT);Wheelchair cushion (measurements OT);Hospital bed;Other (comment);Tub/shower bench(drop arm BSC)    Recommendations for Other Services      Precautions / Restrictions Precautions Precautions: Fall Required Braces or Orthoses: Other Brace Other Brace: Limb protector Restrictions Weight Bearing Restrictions: Yes RLE Weight Bearing: Non weight bearing LLE Weight Bearing: Non weight bearing Other Position/Activity Restrictions: NWB L LE       Mobility Bed Mobility Overal bed mobility: Modified Independent             General bed mobility comments: pt able to manuever bed with no difficulties, sitting upright with back unsupported on arrival  Transfers Overall transfer level: Needs assistance   Transfers: Lateral/Scoot Transfers          Lateral/Scoot Transfers: Supervision General transfer comment: pt able to complete A/P transfer, then donned prosthetic on R to attempt sit to stand transfers as pt is now going home instead of SNF, pt unable to stand this date due to pain in LLE will more than likely  need to be discharged at wheelchair level    Balance Overall balance assessment: Needs assistance Sitting-balance support: No upper extremity supported Sitting balance-Leahy Scale: Good         Standing balance comment: Unable to stand with prosthetic to date                           ADL either performed or assessed with clinical judgement   ADL Overall ADL's : Needs assistance/impaired                                     Functional mobility during ADLs: Minimal assistance General ADL Comments: Pt is able to complete ADLs in the chiar, however declined this date, focused on mobility and equipment needs as patient is now going home instead of SNF     Vision Baseline Vision/History: Wears glasses Wears Glasses: Reading only Patient Visual Report: No change from baseline     Perception     Praxis      Cognition Arousal/Alertness: Awake/alert Behavior During Therapy: WFL for tasks assessed/performed Overall Cognitive Status: Within Functional Limits for tasks assessed                                          Exercises     Shoulder Instructions       General Comments      Pertinent Vitals/ Pain       Pain Assessment: Faces  Faces Pain Scale: Hurts a little bit Pain Location: L residual limb Pain Descriptors / Indicators: Grimacing;Guarding Pain Intervention(s): Limited activity within patient's tolerance;Monitored during session;Repositioned  Home Living                                          Prior Functioning/Environment              Frequency  Min 2X/week        Progress Toward Goals  OT Goals(current goals can now be found in the care plan section)  Progress towards OT goals: Progressing toward goals  Acute Rehab OT Goals Patient Stated Goal: to get home OT Goal Formulation: With patient Time For Goal Achievement: 11/07/19 Potential to Achieve Goals: Good  Plan Discharge plan  needs to be updated    Co-evaluation                 AM-PAC OT "6 Clicks" Daily Activity     Outcome Measure   Help from another person eating meals?: None Help from another person taking care of personal grooming?: A Little Help from another person toileting, which includes using toliet, bedpan, or urinal?: A Lot Help from another person bathing (including washing, rinsing, drying)?: A Lot Help from another person to put on and taking off regular upper body clothing?: None Help from another person to put on and taking off regular lower body clothing?: A Lot 6 Click Score: 17    End of Session Equipment Utilized During Treatment: Gait belt;Rolling walker  OT Visit Diagnosis: Other abnormalities of gait and mobility (R26.89);Pain;Other symptoms and signs involving cognitive function Pain - Right/Left: Left   Activity Tolerance Patient tolerated treatment well   Patient Left with call bell/phone within reach;in chair;with chair alarm set   Nurse Communication          Time: 0940-1003 OT Time Calculation (min): 23 min  Charges: OT General Charges $OT Visit: 1 Visit OT Treatments $Self Care/Home Management : 23-37 mins  Pollyann Glen E. Casia Corti, COTA/L Acute Rehabilitation Services [phone]   Cory Palmer 10/31/2019, 1:04 PM

## 2019-11-01 LAB — GLUCOSE, CAPILLARY
Glucose-Capillary: 94 mg/dL (ref 70–99)
Glucose-Capillary: 150 mg/dL — ABNORMAL HIGH (ref 70–99)
Glucose-Capillary: 124 mg/dL — ABNORMAL HIGH (ref 70–99)
Glucose-Capillary: 98 mg/dL (ref 70–99)

## 2019-11-01 NOTE — Progress Notes (Signed)
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 PMH significant for right BKA, DM, liver cirrhosis, Afib,  and bipolar disorder.    PT Comments    Pt continuing to progress towards achieving his current functional mobility goals. Somewhat limited this session secondary to lethargy and fatigue; however, he was able to transfer from bed to chair via a lateral scoot transfer with supervision for safety. Pt would continue to benefit from skilled physical therapy services at this time while admitted and after d/c to address the below listed limitations in order to improve overall safety and independence with functional mobility.    Follow Up Recommendations  Home health PT;Supervision for mobility/OOB     Equipment Recommendations  Wheelchair (measurements PT);Wheelchair cushion (measurements PT);Other (comment)(drop arm BSC; tub bench)    Recommendations for Other Services       Precautions / Restrictions Precautions Precautions: Fall Restrictions Weight Bearing Restrictions: Yes LLE Weight Bearing: Non weight bearing Other Position/Activity Restrictions: NWB L LE    Mobility  Bed Mobility Overal bed mobility: Modified Independent                Transfers Overall transfer level: Needs assistance Equipment used: None Transfers: Lateral/Scoot Transfers          Lateral/Scoot Transfers: Supervision General transfer comment: pt scooting from bed to drop arm recliner chair towards his R side; pt with strong upper body and able to maintain NWB L LE throughout; no physical assistance needed with transfer  Ambulation/Gait                 Stairs             Wheelchair Mobility    Modified Rankin (Stroke Patients Only)       Balance Overall balance assessment: Needs assistance Sitting-balance support:  No upper extremity supported Sitting balance-Leahy Scale: Good                                      Cognition Arousal/Alertness: Awake/alert Behavior During Therapy: WFL for tasks assessed/performed Overall Cognitive Status: Within Functional Limits for tasks assessed                                        Exercises Amputee Exercises Knee Flexion: AROM;Strengthening;Left;10 reps;Seated Knee Extension: AROM;Strengthening;Left;10 reps;Seated    General Comments        Pertinent Vitals/Pain Pain Assessment: Faces Faces Pain Scale: Hurts a little bit Pain Location: L residual limb Pain Descriptors / Indicators: Sore Pain Intervention(s): Monitored during session;Repositioned    Home Living                      Prior Function            PT Goals (current goals can now be found in the care plan section) Acute Rehab PT Goals PT Goal Formulation: With patient Time For Goal Achievement: 11/07/19 Potential to Achieve Goals: Fair Progress towards PT goals: Progressing toward goals    Frequency    Min 3X/week      PT Plan Current plan remains appropriate    Co-evaluation  AM-PAC PT "6 Clicks" Mobility   Outcome Measure  Help needed turning from your back to your side while in a flat bed without using bedrails?: None Help needed moving from lying on your back to sitting on the side of a flat bed without using bedrails?: None Help needed moving to and from a bed to a chair (including a wheelchair)?: A Little Help needed standing up from a chair using your arms (e.g., wheelchair or bedside chair)?: Total Help needed to walk in hospital room?: Total Help needed climbing 3-5 steps with a railing? : Total 6 Click Score: 14    End of Session   Activity Tolerance: Patient limited by lethargy Patient left: in chair;with call bell/phone within reach;with chair alarm set Nurse Communication: Mobility status PT  Visit Diagnosis: Other abnormalities of gait and mobility (R26.89);Muscle weakness (generalized) (M62.81)     Time: 1191-4782 PT Time Calculation (min) (ACUTE ONLY): 23 min  Charges:  $Therapeutic Activity: 23-37 mins                     Anastasio Champion, DPT  Acute Rehabilitation Services Pager [phone] Office [place] 11/01/2019, 1:24 PM

## 2019-11-01 NOTE — Progress Notes (Signed)
PROGRESS NOTE  Cory Palmer KNL:[id number] DOB: March 03, 1961 DOA: 10/22/2019 PCP: Patient, [name] Per   LOS: 9 days   Brief Narrative / Interim history: Cory Palmer a 59 y.o.malewith medical history significant forbipolar disorder, liver cirrhosis, type 2 diabetes mellitus, recent admission with osteomyelitis, MRSA bacteremia with left foot with suspected source, and was planned for surgery left AMA on 10/21/2019 now returns requesting resumption of his care.Patient reports that he was able to attend to some business that had required to leave AMA, is now dedicated to treatment of his infection, and states that his brother is planning to come from out of town to assist him in his recovery. Patient denies any fevers or chills, has not been taking any medications since leaving the hospital, and denies any chest pain, abdominal pain, cough, or shortness of breath. He continues to have some bloody and purulent drainage from the left foot.   Subjective / 24h Interval events: Still doing well, no chest pain, no shortness of breath  Assessment & Plan: Principal Problem MRSA bacteremia; left foot osteomyelitis -Patient was admitted with worsening foot wound on 3/20, plain films were concerning for osteomyelitis.  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  ID recommended IV antibiotics through April 7, difficult disposition as patient needs intravenous antibiotics and ongoing supervised medical therapy, infectious disease recommended to keep patient hospitalized to complete therapy.  He was not agreeable to SNF for CIR.  Currently on vancomycin.  Blood cultures grew MRSA on 3/20, repeat blood cultures on 3/22 no growth.  Underwent a 2D echo which showed an EF of 60-65% and TEE on 3/26 without vegetations.  Stable today  Active Problems Paroxysmal atrial fibrillation -Not anticoagulated prior to admission, CHADS-VASc may only be 1  (DM), but also poor candidate due to chronic anemia and poor adherence  Type II DM -A1c was 6,8% earlier this month -Check CBGs and use a low-intensity SSI for now  CBG (last 3)  Recent Labs    10/31/19 2009 11/01/19 0632 11/01/19 1138  GLUCAP 110* 94 150*   Hyponatremia -Likely in the setting of liver disease, sodium has now actually normalized  Hypomagnesemia  -Continue to replace today   Bipolar disorder -Continue Seroquel, Zoloft, trazodone  Anemia Iron Def Anemia -Trend H/H - stable -iron, b12, folate, ferritin - suggestive of iron def anemia -Patient denies melena or hematochezia; likely related to cirrhosis, chronic infection  -Needs f/u for IDA, CRC screening if not done  Cirrhosis  -Appears compensated  Scheduled Meds: . docusate sodium  100 mg Oral BID  . enoxaparin (LOVENOX) injection  40 mg Subcutaneous Q24H  . ferrous sulfate  325 mg Oral Q breakfast  . insulin aspart  0-5 Units Subcutaneous QHS  . insulin aspart  0-9 Units Subcutaneous TID WC  . QUEtiapine  300 mg Oral QHS  . sertraline  50 mg Oral Daily  . sodium chloride flush  10-40 mL Intracatheter Q12H  . traZODone  150 mg Oral QHS   Continuous Infusions: . sodium chloride 10 mL/hr at 10/26/19 2343  . lactated ringers 500 mL/hr at 10/23/19 1329  . vancomycin 1,250 mg (11/01/19 0152)   PRN Meds:.acetaminophen **OR** acetaminophen, alum & mag hydroxide-simeth, HYDROmorphone (DILAUDID) injection, oxyCODONE, promethazine **OR** promethazine, senna-docusate, sodium chloride flush  DVT prophylaxis: Lovenox Code Status: Full code Family Communication: d/w patient  Patient admitted from: home Anticipated d/c place: home Barriers to d/c: Needing intravenous antibiotics, no safe  discharge plan in place currently  Consultants:  Infectious disease Orthopedic surgery  Procedures:  3/24 - s/p L transtibial amputation  Echo IMPRESSIONS    1. Normal LV function; trace AI; mild  MR and TR; moderate LAE; no  vegetatioins.  2. Left ventricular ejection fraction, by estimation, is 55 to 60%. The  left ventricle has normal function. The left ventricle has no regional  wall motion abnormalities.  3. Right ventricular systolic function is normal. The right ventricular  size is normal.  4. Left atrial size was moderately dilated. No left atrial/left atrial  appendage thrombus was detected.  5. The mitral valve is normal in structure. Mild mitral valve  regurgitation.  6. The aortic valve is tricuspid. Aortic valve regurgitation is trivial.  Mild aortic valve sclerosis is present, with no evidence of aortic valve  stenosis.  7. There is mild (Grade II) plaque involving the descending aorta.   Antimicrobials: Vancomycin >>   Objective: Vitals:   10/31/19 1444 10/31/19 2006 11/01/19 0333 11/01/19 0740  BP: 99/66 113/79 107/67 102/66  Pulse: 62 61 60 (!) 59  Resp: 17 17 16 16   Temp:  97.9 F (36.6 C) 97.8 F (36.6 C) 98 F (36.7 C)  TempSrc:  Oral Oral Oral  SpO2: 94% 98% 95% 96%  Weight:      Height:        Intake/Output Summary (Last 24 hours) at 11/01/2019 1206 Last data filed at 11/01/2019 1100 Gross per 24 hour  Intake 1080 ml  Output 1500 ml  Net -420 ml   Filed Weights   10/22/19 2329 10/25/19 0734  Weight: 113.9 kg 113.9 kg    Examination:  Constitutional: NAD Respiratory: CTA, no wheezing Cardiovascular: Regular, no murmurs, no edema  Data Reviewed: I have independently reviewed following labs and imaging studies   CBC: Recent Labs  Lab 10/26/19 0521 10/26/19 0521 10/27/19 0637 10/28/19 0503 10/29/19 0416 10/30/19 0500 10/31/19 0212  WBC 7.6   < > 7.1 8.1 7.3 7.0 6.6  NEUTROABS 5.2  --  4.7 6.0 4.9 4.6  --   HGB 8.8*   < > 9.7* 9.4* 9.8* 10.2* 10.3*  HCT 27.8*   < > 30.5* 29.8* 30.8* 32.2* 32.2*  MCV 95.2   < > 95.0 95.8 94.2 95.0 95.3  PLT 227   < > 219 242 217 194 191   < > = values in this interval not displayed.    Basic Metabolic Panel: Recent Labs  Lab 10/26/19 0521 10/26/19 0521 10/27/19 10/29/19 10/28/19 0503 10/29/19 0416 10/30/19 0500 10/31/19 0212  NA 137   < > 138 138 137 137 139  K 4.1   < > 3.9 4.1 3.8 3.9 4.0  CL 107   < > 110 105 106 107 109  CO2 21*   < > 20* 21* 21* 20* 20*  GLUCOSE 142*   < > 117* 142* 124* 109* 139*  BUN 6   < > 6 10 10 12 13   CREATININE 0.65   < > 0.80 0.65 0.66 0.64 0.64  CALCIUM 8.1*   < > 8.2* 8.3* 8.6* 9.2 8.6*  MG 1.9  --  1.6* 1.8 1.7 1.5*  --   PHOS 3.4  --  3.4 3.2 3.1 3.9  --    < > = values in this interval not displayed.   Liver Function Tests: Recent Labs  Lab 10/27/19 10/28/19 0503 10/29/19 0416 10/30/19 0500 10/31/19 0212  AST 27 26 22 22  23  ALT 15 14 14 13 14   ALKPHOS 169* 163* 172* 160* 158*  BILITOT 1.3* 1.2 1.1 1.2 1.2  PROT 7.4 7.2 7.3 7.5 7.3  ALBUMIN 2.1* 2.2* 2.2* 2.5* 2.4*   Coagulation Profile: No results for input(s): INR, PROTIME in the last 168 hours. HbA1C: No results for input(s): HGBA1C in the last 72 hours. CBG: Recent Labs  Lab 10/31/19 1207 10/31/19 1605 10/31/19 2009 11/01/19 0632 11/01/19 1138  GLUCAP 142* 171* 110* 94 150*    Recent Results (from the past 240 hour(s))  SARS CORONAVIRUS 2 (TAT 6-24 HRS) Nasopharyngeal Nasopharyngeal Swab     Status: None   Collection Time: 10/23/19  1:30 AM   Specimen: Nasopharyngeal Swab  Result Value Ref Range Status   SARS Coronavirus 2 NEGATIVE NEGATIVE Final    Comment: (NOTE) SARS-CoV-2 target nucleic acids are NOT DETECTED. The SARS-CoV-2 RNA is generally detectable in upper and lower respiratory specimens during the acute phase of infection. Negative results do not preclude SARS-CoV-2 infection, do not rule out co-infections with other pathogens, and should not be used as the sole basis for treatment or other patient management decisions. Negative results must be combined with clinical observations, patient history, and epidemiological  information. The expected result is Negative. Fact Sheet for Patients: SugarRoll.be Fact Sheet for Healthcare Providers: https://www.woods-mathews.com/ This test is not yet approved or cleared by the [place] FDA and  has been authorized for detection and/or diagnosis of SARS-CoV-2 by FDA under an Emergency Use Authorization (EUA). This EUA will remain  in effect (meaning this test can be used) for the duration of the COVID-19 declaration under Section 56 4(b)(1) of the Act, 21 U.S.C. section 360bbb-3(b)(1), unless the authorization is terminated or revoked sooner. Performed at [facility], [place] [address], [place], [place] [address]   Surgical pcr screen     Status: Abnormal   Collection Time: 10/23/19  3:27 AM   Specimen: Nasal Mucosa; Nasal Swab  Result Value Ref Range Status   MRSA, PCR POSITIVE (A) NEGATIVE Final    Comment: RESULT CALLED TO, READ BACK BY AND VERIFIED WITH: COLLIE,L RN [phone] 10/23/2019 MITCHELL,L    Staphylococcus aureus POSITIVE (A) NEGATIVE Final    Comment: (NOTE) The Xpert SA Assay (FDA approved for NASAL specimens in patients 5 years of age and older), is one component of a comprehensive surveillance program. It is not intended to diagnose infection nor to guide or monitor treatment.      Radiology Studies: No results found.  Marzetta Board, MD, PhD [facility]  Between 7 am - 7 pm I am available, please contact me via Amion or Securechat  Between 7 pm - 7 am I am not available, please contact night coverage MD/APP via Amion

## 2019-11-01 NOTE — Plan of Care (Signed)
  Problem: Pain Managment: Goal: General experience of comfort will improve Outcome: Progressing   Problem: Safety: Goal: Ability to remain free from injury will improve Outcome: Progressing   

## 2019-11-02 LAB — GLUCOSE, CAPILLARY
Glucose-Capillary: 116 mg/dL — ABNORMAL HIGH (ref 70–99)
Glucose-Capillary: 136 mg/dL — ABNORMAL HIGH (ref 70–99)
Glucose-Capillary: 146 mg/dL — ABNORMAL HIGH (ref 70–99)
Glucose-Capillary: 106 mg/dL — ABNORMAL HIGH (ref 70–99)

## 2019-11-02 MED ORDER — CALCIUM CARBONATE ANTACID 500 MG PO CHEW
1.0000 | CHEWABLE_TABLET | Freq: Two times a day (BID) | ORAL | Status: DC
  Administered 2019-11-02 – 2019-11-06 (×7): 200 mg via ORAL
  Filled 2019-11-02 (×7): qty 1

## 2019-11-02 NOTE — Plan of Care (Signed)
  Problem: Safety: Goal: Ability to remain free from injury will improve Outcome: Progressing   

## 2019-11-02 NOTE — Progress Notes (Signed)
PROGRESS NOTE  Cory Palmer JSE:[id number] DOB: Mar 07, 1961 DOA: 10/22/2019 PCP: Patient, [name] Per   LOS: 10 days   Brief Narrative / Interim history: Cory Palmer a 59 y.o.malewith medical history significant forbipolar disorder, liver cirrhosis, type 2 diabetes mellitus, recent admission with osteomyelitis, MRSA bacteremia with left foot with suspected source, and was planned for surgery left AMA on 10/21/2019 now returns requesting resumption of his care.Patient reports that he was able to attend to some business that had required to leave AMA, is now dedicated to treatment of his infection, and states that his brother is planning to come from out of town to assist him in his recovery. Patient denies any fevers or chills, has not been taking any medications since leaving the hospital, and denies any chest pain, abdominal pain, cough, or shortness of breath. He continues to have some bloody and purulent drainage from the left foot.   Subjective / 24h Interval events: No complaints  Assessment & Plan: Principal Problem MRSA bacteremia; left foot osteomyelitis -Patient was admitted with worsening foot wound on 3/20, plain films were concerning for osteomyelitis.  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  ID recommended IV antibiotics through April 7, difficult disposition as patient needs intravenous antibiotics and ongoing supervised medical therapy, infectious disease recommended to keep patient hospitalized to complete therapy.  He was not agreeable to SNF for CIR.  Currently on vancomycin.  Blood cultures grew MRSA on 3/20, repeat blood cultures on 3/22 no growth.  Underwent a 2D echo which showed an EF of 60-65% and TEE on 3/26 without vegetations.  Remained stable  Active Problems Paroxysmal atrial fibrillation -Not anticoagulated prior to admission, CHADS-VASc may only be 1 (DM), but also poor candidate due to  chronic anemia and poor adherence  Type II DM -A1c was 6,8% earlier this month -Check CBGs and use a low-intensity SSI for now  CBG (last 3)  Recent Labs    11/01/19 1631 11/01/19 2135 11/02/19 0639  GLUCAP 124* 98 116*   Hyponatremia -Likely in the setting of liver disease, sodium has now actually normalized, recheck labs tomorrow  Hypomagnesemia  -Continue to replace today   Bipolar disorder -Continue Seroquel, Zoloft, trazodone  Anemia Iron Def Anemia -Trend H/H - stable -iron, b12, folate, ferritin - suggestive of iron def anemia -Patient denies melena or hematochezia; likely related to cirrhosis, chronic infection  -Needs f/u for IDA, CRC screening if not done  Cirrhosis  -Appears compensated  Scheduled Meds: . docusate sodium  100 mg Oral BID  . enoxaparin (LOVENOX) injection  40 mg Subcutaneous Q24H  . ferrous sulfate  325 mg Oral Q breakfast  . insulin aspart  0-5 Units Subcutaneous QHS  . insulin aspart  0-9 Units Subcutaneous TID WC  . QUEtiapine  300 mg Oral QHS  . sertraline  50 mg Oral Daily  . sodium chloride flush  10-40 mL Intracatheter Q12H  . traZODone  150 mg Oral QHS   Continuous Infusions: . sodium chloride 10 mL/hr at 10/26/19 2343  . lactated ringers 500 mL/hr at 10/23/19 1329  . vancomycin 1,250 mg (11/02/19 0217)   PRN Meds:.acetaminophen **OR** acetaminophen, alum & mag hydroxide-simeth, HYDROmorphone (DILAUDID) injection, oxyCODONE, promethazine **OR** promethazine, senna-docusate, sodium chloride flush  DVT prophylaxis: Lovenox Code Status: Full code Family Communication: d/w patient  Patient admitted from: home Anticipated d/c place: home Barriers to d/c: Needing intravenous antibiotics, no safe discharge plan in place currently  Consultants:  Infectious disease Orthopedic surgery  Procedures:  3/24 - s/p L transtibial amputation  Echo IMPRESSIONS    1. Normal LV function; trace AI; mild MR and TR;  moderate LAE; no  vegetatioins.  2. Left ventricular ejection fraction, by estimation, is 55 to 60%. The  left ventricle has normal function. The left ventricle has no regional  wall motion abnormalities.  3. Right ventricular systolic function is normal. The right ventricular  size is normal.  4. Left atrial size was moderately dilated. No left atrial/left atrial  appendage thrombus was detected.  5. The mitral valve is normal in structure. Mild mitral valve  regurgitation.  6. The aortic valve is tricuspid. Aortic valve regurgitation is trivial.  Mild aortic valve sclerosis is present, with no evidence of aortic valve  stenosis.  7. There is mild (Grade II) plaque involving the descending aorta.   Antimicrobials: Vancomycin >>   Objective: Vitals:   11/01/19 1336 11/01/19 2100 11/02/19 0319 11/02/19 0743  BP: 108/61 110/65 117/60 108/67  Pulse: (!) 58 64 66 61  Resp: 15 16 18 18   Temp: 98.3 F (36.8 C) 98 F (36.7 C) 98.2 F (36.8 C) 98.1 F (36.7 C)  TempSrc: Oral Oral Oral Oral  SpO2: 96% 96% 96% 96%  Weight:      Height:        Intake/Output Summary (Last 24 hours) at 11/02/2019 1005 Last data filed at 11/02/2019 0810 Gross per 24 hour  Intake 1680 ml  Output 3700 ml  Net -2020 ml   Filed Weights   10/22/19 2329 10/25/19 0734  Weight: 113.9 kg 113.9 kg    Examination:  Constitutional: No distress Respiratory: Clear without wheezing Cardiovascular: Regular, no murmurs, no edema  Data Reviewed: I have independently reviewed following labs and imaging studies   CBC: Recent Labs  Lab 10/27/19 0637 10/28/19 0503 10/29/19 0416 10/30/19 0500 10/31/19 0212  WBC 7.1 8.1 7.3 7.0 6.6  NEUTROABS 4.7 6.0 4.9 4.6  --   HGB 9.7* 9.4* 9.8* 10.2* 10.3*  HCT 30.5* 29.8* 30.8* 32.2* 32.2*  MCV 95.0 95.8 94.2 95.0 95.3  PLT 219 242 217 194 191   Basic Metabolic Panel: Recent Labs  Lab 10/27/19 0637 10/28/19 0503 10/29/19 0416 10/30/19 0500  10/31/19 0212  NA 138 138 137 137 139  K 3.9 4.1 3.8 3.9 4.0  CL 110 105 106 107 109  CO2 20* 21* 21* 20* 20*  GLUCOSE 117* 142* 124* 109* 139*  BUN 6 10 10 12 13   CREATININE 0.80 0.65 0.66 0.64 0.64  CALCIUM 8.2* 8.3* 8.6* 9.2 8.6*  MG 1.6* 1.8 1.7 1.5*  --   PHOS 3.4 3.2 3.1 3.9  --    Liver Function Tests: Recent Labs  Lab 10/27/19 0637 10/28/19 0503 10/29/19 0416 10/30/19 0500 10/31/19 0212  AST 27 26 22 22 23   ALT 15 14 14 13 14   ALKPHOS 169* 163* 172* 160* 158*  BILITOT 1.3* 1.2 1.1 1.2 1.2  PROT 7.4 7.2 7.3 7.5 7.3  ALBUMIN 2.1* 2.2* 2.2* 2.5* 2.4*   Coagulation Profile: No results for input(s): INR, PROTIME in the last 168 hours. HbA1C: No results for input(s): HGBA1C in the last 72 hours. CBG: Recent Labs  Lab 11/01/19 0632 11/01/19 1138 11/01/19 1631 11/01/19 2135 11/02/19 0639  GLUCAP 94 150* 124* 98 116*    No results found for this or any previous visit (from the past 240 hour(s)).   Radiology Studies: No results found.  Ajayla Iglesias  Cruzita Lederer, MD, PhD [facility]  Between 7 am - 7 pm I am available, please contact me via Amion or Securechat  Between 7 pm - 7 am I am not available, please contact night coverage MD/APP via Amion

## 2019-11-03 LAB — CBC
HCT: 33.9 % — ABNORMAL LOW (ref 39.0–52.0)
Hemoglobin: 10.8 g/dL — ABNORMAL LOW (ref 13.0–17.0)
MCH: 30.3 pg (ref 26.0–34.0)
MCHC: 31.9 g/dL (ref 30.0–36.0)
MCV: 95.2 fL (ref 80.0–100.0)
Platelets: 155 K/uL (ref 150–400)
RBC: 3.56 MIL/uL — ABNORMAL LOW (ref 4.22–5.81)
RDW: 16.1 % — ABNORMAL HIGH (ref 11.5–15.5)
WBC: 5.4 K/uL (ref 4.0–10.5)
nRBC: 0 % (ref 0.0–0.2)

## 2019-11-03 LAB — BASIC METABOLIC PANEL
Anion gap: 12 (ref 5–15)
BUN: 14 mg/dL (ref 6–20)
CO2: 15 mmol/L — ABNORMAL LOW (ref 22–32)
Calcium: 8.8 mg/dL — ABNORMAL LOW (ref 8.9–10.3)
Chloride: 112 mmol/L — ABNORMAL HIGH (ref 98–111)
Creatinine, Ser: 0.86 mg/dL (ref 0.61–1.24)
GFR calc Af Amer: >60 mL/min (ref >60)
GFR calc non Af Amer: >60 mL/min (ref >60)
Glucose, Bld: 95 mg/dL (ref 70–99)
Potassium: 4.7 mmol/L (ref 3.5–5.1)
Sodium: 139 mmol/L (ref 135–145)

## 2019-11-03 LAB — GLUCOSE, CAPILLARY
Glucose-Capillary: 102 mg/dL — ABNORMAL HIGH (ref 70–99)
Glucose-Capillary: 103 mg/dL — ABNORMAL HIGH (ref 70–99)
Glucose-Capillary: 138 mg/dL — ABNORMAL HIGH (ref 70–99)
Glucose-Capillary: 147 mg/dL — ABNORMAL HIGH (ref 70–99)

## 2019-11-03 LAB — VANCOMYCIN, TROUGH: Vancomycin Tr: 16 ug/mL (ref 15–20)

## 2019-11-03 LAB — VANCOMYCIN, PEAK: Vancomycin Pk: 41 ug/mL — ABNORMAL HIGH (ref 30–40)

## 2019-11-03 MED ORDER — VANCOMYCIN HCL IN DEXTROSE 1-5 GM/200ML-% IV SOLN
1000.0000 mg | Freq: Two times a day (BID) | INTRAVENOUS | Status: DC
  Administered 2019-11-03 – 2019-11-06 (×6): 1000 mg via INTRAVENOUS
  Filled 2019-11-03 (×8): qty 200

## 2019-11-03 NOTE — Progress Notes (Signed)
Pharmacy Antibiotic Note  Cory Palmer is a 59 y.o. male admitted on 10/22/2019 with MRSA bacteremia/osteomyelitis.  Pharmacy has been consulted for Vancomycin dosing. WBC is WNL and stable. Renal function remains stable.   AUC today is supratherapeutic at 632. WBC stable. SCr stable. Afebrile.   Plan: Reduce dose to 1000 mg IV q12h for eAUC of 505 End date set for 4/7 SCr every 72 hours while on therapy  Height: 6\' 4"  (193 cm) Weight: 113.9 kg (251 lb) IBW/kg (Calculated) : 86.8  Temp (24hrs), Avg:97.7 F (36.5 C), Min:97.4 F (36.3 C), Max:98.1 F (36.7 C)  Recent Labs  Lab 10/27/19 1427 10/27/19 2325 10/28/19 0503 10/29/19 0416 10/30/19 0500 10/31/19 0212 11/03/19 0500 11/03/19 1014  WBC  --   --  8.1 7.3 7.0 6.6  --  5.4  CREATININE  --   --  0.65 0.66 0.64 0.64 0.86  --   VANCOTROUGH  --  13*  --   --   --   --   --   --   VANCOPEAK 30  --   --   --   --   --  41*  --     Estimated Creatinine Clearance: 127.7 mL/min (by C-G formula based on SCr of 0.86 mg/dL).    No Known Allergies  01/03/20, PharmD PGY1 Pharmacy Resident  Please check AMION for all [facility] Pharmacy phone numbers After 10:00 PM, call Main Pharmacy [phone]  11/03/2019, 2:50 PM

## 2019-11-03 NOTE — Progress Notes (Signed)
PROGRESS NOTE  Cory Palmer JXB:[id number] DOB: [birth year] DOA: 10/22/2019 PCP: Patient, [name] Per   LOS: 11 days   Brief Narrative / Interim history: Cory Palmer a 59 y.o.malewith medical history significant forbipolar disorder, liver cirrhosis, type 2 diabetes mellitus, recent admission with osteomyelitis, MRSA bacteremia with left foot with suspected source, and was planned for surgery left AMA on 10/21/2019 now returns requesting resumption of his care.Patient reports that he was able to attend to some business that had required to leave AMA, is now dedicated to treatment of his infection, and states that his brother is planning to come from out of town to assist him in his recovery. Patient denies any fevers or chills, has not been taking any medications since leaving the hospital, and denies any chest pain, abdominal pain, cough, or shortness of breath. He continues to have some bloody and purulent drainage from the left foot.   Subjective / 24h Interval events: No complaints, staying in bed  Assessment & Plan: Principal Problem MRSA bacteremia; left foot osteomyelitis -Patient was admitted with worsening foot wound on 3/20, plain films were concerning for osteomyelitis.  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  ID recommended IV antibiotics through April 7, difficult disposition as patient needs intravenous antibiotics and ongoing supervised medical therapy, infectious disease recommended to keep patient hospitalized to complete therapy.  He was not agreeable to SNF for CIR.  Currently on vancomycin.  Blood cultures grew MRSA on 3/20, repeat blood cultures on 3/22 no growth.  Underwent a 2D echo which showed an EF of 60-65% and TEE on 3/26 without vegetations.  Remained stable  Active Problems Paroxysmal atrial fibrillation -Not anticoagulated prior to admission, CHADS-VASc may only be 1 (DM), but also poor  candidate due to chronic anemia and poor adherence  Type II DM -A1c was 6,8% earlier this month -Check CBGs and use a low-intensity SSI for now.  Overall stable  CBG (last 3)  Recent Labs    11/02/19 1632 11/02/19 2053 11/03/19 0641  GLUCAP 146* 106* 102*   Hyponatremia -Likely in the setting of liver disease, sodium has now actually normalized, recheck labs tomorrow  Hypomagnesemia  -Recheck magnesium tomorrow  Bipolar disorder -Continue Seroquel, Zoloft, trazodone  Anemia Iron Def Anemia -Trend H/H - stable -iron, b12, folate, ferritin - suggestive of iron def anemia -Patient denies melena or hematochezia; likely related to cirrhosis, chronic infection  -Needs f/u for IDA, CRC screening if not done -Recheck labs tomorrow  Cirrhosis  -Appears compensated  Scheduled Meds: . calcium carbonate  1 tablet Oral BID WC  . docusate sodium  100 mg Oral BID  . enoxaparin (LOVENOX) injection  40 mg Subcutaneous Q24H  . ferrous sulfate  325 mg Oral Q breakfast  . insulin aspart  0-5 Units Subcutaneous QHS  . insulin aspart  0-9 Units Subcutaneous TID WC  . QUEtiapine  300 mg Oral QHS  . sertraline  50 mg Oral Daily  . sodium chloride flush  10-40 mL Intracatheter Q12H  . traZODone  150 mg Oral QHS   Continuous Infusions: . sodium chloride 10 mL/hr at 10/26/19 2343  . lactated ringers 500 mL/hr at 10/23/19 1329  . vancomycin Stopped (11/03/19 0434)   PRN Meds:.acetaminophen **OR** acetaminophen, alum & mag hydroxide-simeth, HYDROmorphone (DILAUDID) injection, oxyCODONE, promethazine **OR** promethazine, senna-docusate, sodium chloride flush  DVT prophylaxis: Lovenox Code Status: Full code Family Communication: d/w patient  Patient admitted from: home Anticipated  d/c place: home Barriers to d/c: Needing intravenous antibiotics, no safe discharge plan in place currently  Consultants:  Infectious disease Orthopedic surgery  Procedures:  3/24 - s/p L  transtibial amputation  Echo IMPRESSIONS    1. Normal LV function; trace AI; mild MR and TR; moderate LAE; no  vegetatioins.  2. Left ventricular ejection fraction, by estimation, is 55 to 60%. The  left ventricle has normal function. The left ventricle has no regional  wall motion abnormalities.  3. Right ventricular systolic function is normal. The right ventricular  size is normal.  4. Left atrial size was moderately dilated. No left atrial/left atrial  appendage thrombus was detected.  5. The mitral valve is normal in structure. Mild mitral valve  regurgitation.  6. The aortic valve is tricuspid. Aortic valve regurgitation is trivial.  Mild aortic valve sclerosis is present, with no evidence of aortic valve  stenosis.  7. There is mild (Grade II) plaque involving the descending aorta.   Antimicrobials: Vancomycin >>   Objective: Vitals:   11/02/19 1310 11/02/19 2050 11/03/19 0333 11/03/19 0743  BP: 109/66 134/69 102/64 (!) 97/59  Pulse: 66 65 62 61  Resp: 18   17  Temp: 98.3 F (36.8 C) 97.7 F (36.5 C) (!) 97.5 F (36.4 C) (!) 97.4 F (36.3 C)  TempSrc: Oral Oral Oral Oral  SpO2: 95% 97% 95% 95%  Weight:      Height:        Intake/Output Summary (Last 24 hours) at 11/03/2019 1009 Last data filed at 11/03/2019 0304 Gross per 24 hour  Intake 1712.09 ml  Output 1600 ml  Net 112.09 ml   Filed Weights   10/22/19 2329 10/25/19 0734  Weight: 113.9 kg 113.9 kg    Examination:  Constitutional: NAD  Data Reviewed: I have independently reviewed following labs and imaging studies   CBC: Recent Labs  Lab 10/28/19 0503 10/29/19 0416 10/30/19 0500 10/31/19 0212  WBC 8.1 7.3 7.0 6.6  NEUTROABS 6.0 4.9 4.6  --   HGB 9.4* 9.8* 10.2* 10.3*  HCT 29.8* 30.8* 32.2* 32.2*  MCV 95.8 94.2 95.0 95.3  PLT 242 217 194 191   Basic Metabolic Panel: Recent Labs  Lab 10/28/19 0503 10/29/19 0416 10/30/19 0500 10/31/19 0212 11/03/19 0500  NA 138 137 137 139  139  K 4.1 3.8 3.9 4.0 4.7  CL 105 106 107 109 112*  CO2 21* 21* 20* 20* 15*  GLUCOSE 142* 124* 109* 139* 95  BUN 10 10 12 13 14   CREATININE 0.65 0.66 0.64 0.64 0.86  CALCIUM 8.3* 8.6* 9.2 8.6* 8.8*  MG 1.8 1.7 1.5*  --   --   PHOS 3.2 3.1 3.9  --   --    Liver Function Tests: Recent Labs  Lab 10/28/19 0503 10/29/19 0416 10/30/19 0500 10/31/19 0212  AST 26 22 22 23   ALT 14 14 13 14   ALKPHOS 163* 172* 160* 158*  BILITOT 1.2 1.1 1.2 1.2  PROT 7.2 7.3 7.5 7.3  ALBUMIN 2.2* 2.2* 2.5* 2.4*   Coagulation Profile: No results for input(s): INR, PROTIME in the last 168 hours. HbA1C: No results for input(s): HGBA1C in the last 72 hours. CBG: Recent Labs  Lab 11/02/19 0639 11/02/19 1201 11/02/19 1632 11/02/19 2053 11/03/19 0641  GLUCAP 116* 136* 146* 106* 102*    No results found for this or any previous visit (from the past 240 hour(s)).   Radiology Studies: No results found.  01/02/20, MD, PhD [facility]  Between 7 am - 7 pm I am available, please contact me via Amion or Securechat  Between 7 pm - 7 am I am not available, please contact night coverage MD/APP via Amion

## 2019-11-04 LAB — COMPREHENSIVE METABOLIC PANEL
ALT: 15 U/L (ref 0–44)
AST: 25 U/L (ref 15–41)
Albumin: 2.7 g/dL — ABNORMAL LOW (ref 3.5–5.0)
Alkaline Phosphatase: 124 U/L (ref 38–126)
Anion gap: 11 (ref 5–15)
BUN: 16 mg/dL (ref 6–20)
CO2: 20 mmol/L — ABNORMAL LOW (ref 22–32)
Calcium: 9.1 mg/dL (ref 8.9–10.3)
Chloride: 108 mmol/L (ref 98–111)
Creatinine, Ser: 0.59 mg/dL — ABNORMAL LOW (ref 0.61–1.24)
GFR calc Af Amer: >60 mL/min (ref >60)
GFR calc non Af Amer: >60 mL/min (ref >60)
Glucose, Bld: 118 mg/dL — ABNORMAL HIGH (ref 70–99)
Potassium: 3.7 mmol/L (ref 3.5–5.1)
Sodium: 139 mmol/L (ref 135–145)
Total Bilirubin: 1.1 mg/dL (ref 0.3–1.2)
Total Protein: 7.3 g/dL (ref 6.5–8.1)

## 2019-11-04 LAB — CBC
HCT: 33.5 % — ABNORMAL LOW (ref 39.0–52.0)
Hemoglobin: 10.7 g/dL — ABNORMAL LOW (ref 13.0–17.0)
MCH: 30.4 pg (ref 26.0–34.0)
MCHC: 31.9 g/dL (ref 30.0–36.0)
MCV: 95.2 fL (ref 80.0–100.0)
Platelets: 139 10*3/uL — ABNORMAL LOW (ref 150–400)
RBC: 3.52 MIL/uL — ABNORMAL LOW (ref 4.22–5.81)
RDW: 16.1 % — ABNORMAL HIGH (ref 11.5–15.5)
WBC: 6.1 10*3/uL (ref 4.0–10.5)
nRBC: 0 % (ref 0.0–0.2)

## 2019-11-04 LAB — GLUCOSE, CAPILLARY
Glucose-Capillary: 111 mg/dL — ABNORMAL HIGH (ref 70–99)
Glucose-Capillary: 121 mg/dL — ABNORMAL HIGH (ref 70–99)
Glucose-Capillary: 144 mg/dL — ABNORMAL HIGH (ref 70–99)
Glucose-Capillary: 128 mg/dL — ABNORMAL HIGH (ref 70–99)

## 2019-11-04 LAB — PHOSPHORUS: Phosphorus: 3.4 mg/dL (ref 2.5–4.6)

## 2019-11-04 LAB — MAGNESIUM: Magnesium: 1.5 mg/dL — ABNORMAL LOW (ref 1.7–2.4)

## 2019-11-04 MED ORDER — MAGNESIUM SULFATE 2 GM/50ML IV SOLN
2.0000 g | Freq: Once | INTRAVENOUS | Status: AC
  Administered 2019-11-04: 2 g via INTRAVENOUS
  Filled 2019-11-04: qty 50

## 2019-11-04 NOTE — TOC Progression Note (Signed)
Transition of Care (TOC) - Progression Note  Donn Pierini RN, BSN Transitions of Care Unit 4E- RN Case Manager [phone]   Patient Details  Name: Cory Palmer MRN: [id number] Date of Birth: 03/06/1961  Transition of Care [facility]) CM/SW Contact  Zenda Alpers, Lenn Sink, RN Phone Number: 11/04/2019, 3:25 PM  Clinical Narrative:    Spoke with pt at bedside regarding transition of care needs- per pt he has not been able to get in touch with landlord regarding rent- he reports his rent is usually due on the 3rd of the month and he is worried about getting his payment to his landlord- discussed that he will be finished with his IV abx on 4/7 and will be ready for discharge then. Discussed  Primary care doctor needs - pt reports he used to see a Clydie Braun Rictor? But has not seen her in a while- also use to have a Educational psychologist that would check on him and an Charity fundraiser with his insurance but hasn't spoken with either of them in awhile. Pt states he will reach out to them as he has their contact numbers. Also discussed DME and HH needs- pt open to receiving [facility] services- list provided to pt for [facility] choice- he is going to look over it and will need f/u on choice of agency for [facility] referral. Pt also states he has someone else he wants to contact regarding a name for a primary doctor. Will have regular unit CM f/u prior to discharge. Pt requesting an electric w/c explained to pt that those are not provided on discharge from hospital only manual wheelchairs and once he finds a primary doctor he can f/u on electric wheelchair with his PCP.  Call made to [facility] with [employer] regarding DME need for w/c on discharge.    Expected Discharge Plan: Home w Home Health Services Barriers to Discharge: Continued Medical Work up  Expected Discharge Plan and Services Expected Discharge Plan: Home w Home Health Services     Post Acute Care Choice: Durable Medical Equipment, Home Health Living arrangements for the past 2 months:  Apartment                 DME Arranged: Government social research officer DME Agency: [employer] Date DME Agency Contacted: 11/04/19 Time DME Agency Contacted: 1525 Representative spoke with at DME Agency: Ian Malkin HH Arranged: PT, OT, Nurse's Aide           Social Determinants of Health (SDOH) Interventions    Readmission Risk Interventions Readmission Risk Prevention Plan 10/21/2019  Transportation Screening Complete  PCP or Specialist Appt within 5-7 Days Complete  Home Care Screening Complete  Medication Review (RN CM) Complete  Some recent data might be hidden

## 2019-11-04 NOTE — Progress Notes (Signed)
   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  A cane, crutch or walker will not resolve issue with performing activities of daily living. A wheelchair will allow patient to safely perform daily activities. Patient can safely propel the wheelchair in the home or has a caregiver who can provide assistance. Length of need Lifetime. Accessories: elevating leg rests (ELRs), wheel locks, extensions and anti-tippers.  11/01/19 3235

## 2019-11-04 NOTE — Progress Notes (Signed)
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 PMH significant for right BKA, DM, liver cirrhosis, Afib,  and bipolar disorder.    PT Comments    Patient re[ports that he plans to return home, patient does  Not have any support, per patient. Patient currently demonstrates decreased problem solving in regards to safe transfers. Patient did comment that maybe he could stand  Up with prosthesis on right and the limb protector on the left. Explained to patient that he could not. PTherapy is concerned for patient being able to perform safe transfers and manage all of his ADL's. Patient adamant about not going to rehab. Patient could not say for certain that a WC would fit into his Bathroom but did comment that apt. Was handicapped accessible. Patient will benefit from [facility] transfer  Practice prior to DC- transfer from bed to [facility] to toilet. .   Follow Up Recommendations  Home health PT;Supervision for mobility/OOB vs SNF/CIR     Equipment Recommendations  Wheelchair (measurements PT);Wheelchair cushion (measurements PT);Other (comment)    Recommendations for Other Services       Precautions / Restrictions Precautions Precautions: Fall Required Braces or Orthoses: Other Brace Other Brace: Limb protector, needs encouragement to wear it. Has R Prosthesis in room Restrictions Other Position/Activity Restrictions: NWB L LE    Mobility  Bed Mobility Overal bed mobility: Modified Independent             General bed mobility comments: pt able to long sit in bed  Transfers Overall transfer level: Needs assistance   Transfers: Lateral/Scoot Transfers          Lateral/Scoot Transfers: Min guard General transfer comment: Pt. donned right  prosthesis in sitting, pt. stated that he could not transfer because he did not have his left leg.  Reviewed with patient  lateral scoot transfers. Patient performed with min guard into recliner. Assisted  placing left  limb protector. Patient performed lateral scoot back to bed with more difficulty possibly due to L limb protector and R Prosthesis. Patient again required cues for problem solving  to perform  lateral scoot.  Ambulation/Gait                 Stairs             Wheelchair Mobility    Modified Rankin (Stroke Patients Only)       Balance   Sitting-balance support: No upper extremity supported Sitting balance-Leahy Scale: Good                                      Cognition Arousal/Alertness: Awake/alert Behavior During Therapy: WFL for tasks assessed/performed   Area of Impairment: Problem solving;Safety/judgement                   Current Attention Level: Selective Memory: Decreased short-term memory;Decreased recall of precautions   Safety/Judgement: Decreased awareness of deficits;Decreased awareness of safety Awareness: Emergent Problem Solving: Slow processing General Comments: WFL for general conversation. stated sveral times that he could not transfer to recliner withoput his L eft leg. Reviewed lateral scoot to recliner and provided  cues multile times for technique. Patient trepots that he will be home alone, no family. Relies on specialized trransprotation.      Exercises  General Comments        Pertinent Vitals/Pain Pain Score: 8  Pain Location: L residual limb Pain Descriptors / Indicators: Aching;Sharp Pain Intervention(s): Repositioned    Home Living                      Prior Function            PT Goals (current goals can now be found in the care plan section) Progress towards PT goals: Progressing toward goals    Frequency    Min 3X/week      PT Plan Current plan remains appropriate    Co-evaluation              AM-PAC PT "6 Clicks" Mobility   Outcome Measure   Help needed turning from your back to your side while in a flat bed without using bedrails?: None Help needed moving from lying on your back to sitting on the side of a flat bed without using bedrails?: None Help needed moving to and from a bed to a chair (including a wheelchair)?: A Little Help needed standing up from a chair using your arms (e.g., wheelchair or bedside chair)?: Total Help needed to walk in hospital room?: Total Help needed climbing 3-5 steps with a railing? : Total 6 Click Score: 14    End of Session   Activity Tolerance: Patient tolerated treatment well Patient left: in bed;with call bell/phone within reach;with bed alarm set Nurse Communication: Mobility status PT Visit Diagnosis: Other abnormalities of gait and mobility (R26.89);Muscle weakness (generalized) (M62.81)     Time: 8469-6295 PT Time Calculation (min) (ACUTE ONLY): 32 min  Charges:  $Therapeutic Activity: 23-37 mins                     Tresa Endo PT Acute Rehabilitation Services Pager [phone] Office [phone]    Claretha Cooper 11/04/2019, 10:34 AM

## 2019-11-04 NOTE — Progress Notes (Signed)
PROGRESS NOTE  Cory Palmer NOI:[id number] DOB: 1960-08-31 DOA: 10/22/2019 PCP: Patient, [name] Per   LOS: 12 days   Brief Narrative / Interim history: Cory Palmer a 59 y.o.malewith medical history significant forbipolar disorder, liver cirrhosis, type 2 diabetes mellitus, recent admission with osteomyelitis, MRSA bacteremia with left foot with suspected source, and was planned for surgery left AMA on 10/21/2019 now returns requesting resumption of his care.Patient reports that he was able to attend to some business that had required to leave AMA, is now dedicated to treatment of his infection, and states that his brother is planning to come from out of town to assist him in his recovery. Patient denies any fevers or chills, has not been taking any medications since leaving the hospital, and denies any chest pain, abdominal pain, cough, or shortness of breath. He continues to have some bloody and purulent drainage from the left foot.   Subjective / 24h Interval events: No complaints today.  Feels well.  He thinks he may be homeless now given the fact that he was unable to pay his rent from being in the hospital and does not have any friends in the area to help a lot.  He was trying to call the landlord but was unsuccessful  Assessment & Plan: Principal Problem MRSA bacteremia; left foot osteomyelitis -Patient was admitted with worsening foot wound on 3/20, plain films were concerning for osteomyelitis.  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  ID recommended IV antibiotics through April 7, difficult disposition as patient needs intravenous antibiotics and ongoing supervised medical therapy, infectious disease recommended to keep patient hospitalized to complete therapy.  He was not agreeable to SNF for CIR.  Currently on vancomycin.  Blood cultures grew MRSA on 3/20, repeat blood cultures on 3/22 no growth.  Underwent a 2D  echo which showed an EF of 60-65% and TEE on 3/26 without vegetations.  Remained stable  Active Problems Paroxysmal atrial fibrillation -Not anticoagulated prior to admission, CHADS-VASc may only be 1 (DM), but also poor candidate due to chronic anemia and poor adherence  Type II DM -A1c was 6,8% earlier this month -Check CBGs and use a low-intensity SSI for now.  Stable CBGs  CBG (last 3)  Recent Labs    11/03/19 1556 11/03/19 2141 11/04/19 0637  GLUCAP 138* 147* 111*   Hyponatremia -Likely in the setting of liver disease, sodium has remained normal  Hypomagnesemia  -Magnesium on the low side today, will give 2 more g   Bipolar disorder -Continue Seroquel, Zoloft, trazodone  Anemia Iron Def Anemia -Trend H/H - stable -iron, b12, folate, ferritin - suggestive of iron def anemia -Patient denies melena or hematochezia; likely related to cirrhosis, chronic infection  -Needs f/u for IDA, CRC screening if not done -Hemoglobin stable this morning  Cirrhosis  -Appears compensated  Scheduled Meds: . calcium carbonate  1 tablet Oral BID WC  . docusate sodium  100 mg Oral BID  . enoxaparin (LOVENOX) injection  40 mg Subcutaneous Q24H  . ferrous sulfate  325 mg Oral Q breakfast  . insulin aspart  0-5 Units Subcutaneous QHS  . insulin aspart  0-9 Units Subcutaneous TID WC  . QUEtiapine  300 mg Oral QHS  . sertraline  50 mg Oral Daily  . sodium chloride flush  10-40 mL Intracatheter Q12H  . traZODone  150 mg Oral QHS   Continuous Infusions: . sodium chloride Stopped (11/01/19 0152)  .  lactated ringers 500 mL/hr at 10/23/19 1329  . vancomycin 1,000 mg (11/04/19 0357)   PRN Meds:.acetaminophen **OR** acetaminophen, alum & mag hydroxide-simeth, HYDROmorphone (DILAUDID) injection, oxyCODONE, promethazine **OR** promethazine, senna-docusate, sodium chloride flush  DVT prophylaxis: Lovenox Code Status: Full code Family Communication: d/w patient  Patient  admitted from: home Anticipated d/c place: home Barriers to d/c: Needing intravenous antibiotics, no safe discharge plan in place currently  Consultants:  Infectious disease Orthopedic surgery  Procedures:  3/24 - s/p L transtibial amputation  Echo IMPRESSIONS    1. Normal LV function; trace AI; mild MR and TR; moderate LAE; no  vegetatioins.  2. Left ventricular ejection fraction, by estimation, is 55 to 60%. The  left ventricle has normal function. The left ventricle has no regional  wall motion abnormalities.  3. Right ventricular systolic function is normal. The right ventricular  size is normal.  4. Left atrial size was moderately dilated. No left atrial/left atrial  appendage thrombus was detected.  5. The mitral valve is normal in structure. Mild mitral valve  regurgitation.  6. The aortic valve is tricuspid. Aortic valve regurgitation is trivial.  Mild aortic valve sclerosis is present, with no evidence of aortic valve  stenosis.  7. There is mild (Grade II) plaque involving the descending aorta.   Antimicrobials: Vancomycin >>   Objective: Vitals:   11/03/19 1411 11/03/19 1926 11/04/19 0415 11/04/19 0738  BP: 112/64 130/83 125/75 106/64  Pulse: 65 67 72 64  Resp: 18 16 15 17   Temp: 98.1 F (36.7 C) 97.9 F (36.6 C) (!) 97.3 F (36.3 C) 98.3 F (36.8 C)  TempSrc: Oral Oral Oral Oral  SpO2: 94% 99% 97% 95%  Weight:      Height:        Intake/Output Summary (Last 24 hours) at 11/04/2019 0945 Last data filed at 11/04/2019 0700 Gross per 24 hour  Intake 1282.82 ml  Output 1100 ml  Net 182.82 ml   Filed Weights   10/22/19 2329 10/25/19 0734  Weight: 113.9 kg 113.9 kg    Examination:  Constitutional: NAD, calm, comfortable Vitals:   11/03/19 1411 11/03/19 1926 11/04/19 0415 11/04/19 0738  BP: 112/64 130/83 125/75 106/64  Pulse: 65 67 72 64  Resp: 18 16 15 17   Temp: 98.1 F (36.7 C) 97.9 F (36.6 C) (!) 97.3 F (36.3 C) 98.3 F (36.8 C)   TempSrc: Oral Oral Oral Oral  SpO2: 94% 99% 97% 95%  Weight:      Height:       Eyes: PERRL, lids and conjunctivae normal ENMT: Mucous membranes are moist. Respiratory: clear to auscultation bilaterally, no wheezing, no crackles.  Cardiovascular: Regular rate and rhythm, no murmurs / rubs / gallops.  Abdomen: no tenderness, no masses palpated. Bowel sounds positive.  Musculoskeletal: no clubbing / cyanosis. Normal muscle tone.  Skin: no rashes Neurologic: non focal   Data Reviewed: I have independently reviewed following labs and imaging studies   CBC: Recent Labs  Lab 10/29/19 0416 10/30/19 0500 10/31/19 0212 11/03/19 1014 11/04/19 0348  WBC 7.3 7.0 6.6 5.4 6.1  NEUTROABS 4.9 4.6  --   --   --   HGB 9.8* 10.2* 10.3* 10.8* 10.7*  HCT 30.8* 32.2* 32.2* 33.9* 33.5*  MCV 94.2 95.0 95.3 95.2 95.2  PLT 217 194 191 155 139*   Basic Metabolic Panel: Recent Labs  Lab 10/29/19 0416 10/30/19 0500 10/31/19 0212 11/03/19 0500 11/04/19 0348  NA 137 137 139 139 139  K 3.8 3.9  4.0 4.7 3.7  CL 106 107 109 112* 108  CO2 21* 20* 20* 15* 20*  GLUCOSE 124* 109* 139* 95 118*  BUN 10 12 13 14 16   CREATININE 0.66 0.64 0.64 0.86 0.59*  CALCIUM 8.6* 9.2 8.6* 8.8* 9.1  MG 1.7 1.5*  --   --  1.5*  PHOS 3.1 3.9  --   --  3.4   Liver Function Tests: Recent Labs  Lab 10/29/19 0416 10/30/19 0500 10/31/19 0212 11/04/19 0348  AST 22 22 23 25   ALT 14 13 14 15   ALKPHOS 172* 160* 158* 124  BILITOT 1.1 1.2 1.2 1.1  PROT 7.3 7.5 7.3 7.3  ALBUMIN 2.2* 2.5* 2.4* 2.7*   Coagulation Profile: No results for input(s): INR, PROTIME in the last 168 hours. HbA1C: No results for input(s): HGBA1C in the last 72 hours. CBG: Recent Labs  Lab 11/03/19 0641 11/03/19 1109 11/03/19 1556 11/03/19 2141 11/04/19 0637  GLUCAP 102* 103* 138* 147* 111*    No results found for this or any previous visit (from the past 240 hour(s)).   Radiology Studies: No results found.  Marzetta Board, MD,  PhD [facility]  Between 7 am - 7 pm I am available, please contact me via Amion or Securechat  Between 7 pm - 7 am I am not available, please contact night coverage MD/APP via Amion

## 2019-11-05 LAB — GLUCOSE, CAPILLARY
Glucose-Capillary: 103 mg/dL — ABNORMAL HIGH (ref 70–99)
Glucose-Capillary: 111 mg/dL — ABNORMAL HIGH (ref 70–99)
Glucose-Capillary: 127 mg/dL — ABNORMAL HIGH (ref 70–99)

## 2019-11-05 NOTE — Progress Notes (Signed)
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 PMH significant for right BKA, DM, liver cirrhosis, Afib,  and bipolar disorder.   OT comments  Pt supine in bed, upon OT arrival pt stated "you came in right as I was entering a depressive state", pt agreeable to OT session this date. Pt required minguard for donning prosthetic and limb protector while sitting EOB. He required minguard for lateral scoot transfer to drop arm recliner. Pt demonstrates decreased safety awareness requiring cues for weight bearing precautions on LLE. Pt agitated at times with therapist when discussed d/c plan, available assistance at d/c and equipment recommendations for d/c home. Pt reports he needs a power wheelchair because he has a lot of hills to get from his home to the grocery store. Pt will continue to benefit from skilled OT services to maximize safety and independence with ADL/IADL and functional mobility. Will continue to follow acutely and progress as tolerated.    Follow Up Recommendations  Home health OT;Supervision - Intermittent    Equipment Recommendations  Wheelchair (measurements OT);Wheelchair cushion (measurements OT);3 in 1 bedside commode;Other (comment)(drop arm 3in1)    Recommendations for Other Services      Precautions / Restrictions Precautions Precautions: Fall Precaution Comments: Required Braces or Orthoses: Other Brace Other Brace: Limb protector, needs encouragement to wear it. Has R Prosthesis in room Restrictions Weight Bearing Restrictions: Yes RLE Weight Bearing: (prosthetic in room ) LLE Weight Bearing: Non weight bearing Other Position/Activity Restrictions: NWB L LE       Mobility Bed Mobility Overal bed mobility: Modified Independent Bed Mobility: Supine to Sit     Supine to sit: Modified independent  (Device/Increase time)     General bed mobility comments: increased time but able to come to long sitting in bed with increased time due to poor core strength.  Transfers Overall transfer level: Needs assistance Equipment used: None Transfers: Lateral/Scoot Transfers          Lateral/Scoot Transfers: Min guard General transfer comment: minguard for safety    Balance Overall balance assessment: Needs assistance Sitting-balance support: No upper extremity supported Sitting balance-Leahy Scale: Good Sitting balance - Comments: able to maintain balance with challenges to sitting balance, able to reach way outside base of support without loss of balance       Standing balance comment: unable to tolerate standing                           ADL either performed or assessed with clinical judgement   ADL Overall ADL's : Needs assistance/impaired     Grooming: Set up;Sitting               Lower Body Dressing: Min guard;Sitting/lateral leans Lower Body Dressing Details (indicate cue type and reason): pt donned prosthetic and limb protector while sitting EOB Toilet Transfer: Min guard(lateral scoot transfer to drop arm recliner)           Functional mobility during ADLs: Min guard;Wheelchair General ADL Comments: pt will need wheelchair, pt limited to seated activities and required minguard for lateral scoot transfer to recliner;educated pt on importance of mobility and use of limb protector during mobility, pt required max encouragement to utilize limb protector     Vision       Perception     Praxis  Cognition Arousal/Alertness: Awake/alert Behavior During Therapy: WFL for tasks assessed/performed;Agitated Overall Cognitive Status: No family/caregiver present to determine baseline cognitive functioning Area of Impairment: Safety/judgement;Problem solving                   Current Attention Level: Selective Memory: Decreased short-term  memory;Decreased recall of precautions   Safety/Judgement: Decreased awareness of safety;Decreased awareness of deficits Awareness: Emergent Problem Solving: Slow processing General Comments: Pt agitated when therapist asked about additional family to assist with pt at d/c;he was also agitated when discussing medical equipment, pt verbalized wanting a power wheel chair, therapist educated him that he would have to go through PCP for this item but we are able to recommend manual w/c pt responded with "isn't a life more important than a piece of equipment", pt attempted to use limb protector to stand and verbalized "I can't stand with this thing on", educated pt on purpose of limb protector        Exercises Amputee Exercises Quad Sets: AROM;Both;10 reps;Supine Hip ABduction/ADduction: AAROM;Both;10 reps;Supine   Shoulder Instructions       General Comments vss    Pertinent Vitals/ Pain       Pain Assessment: Faces Pain Score: 8  Faces Pain Scale: Hurts a little bit Pain Location: L residual limb Pain Descriptors / Indicators: Aching;Sharp Pain Intervention(s): Limited activity within patient's tolerance;Monitored during session  Home Living                                          Prior Functioning/Environment              Frequency  Min 2X/week        Progress Toward Goals  OT Goals(current goals can now be found in the care plan section)  Progress towards OT goals: Progressing toward goals  Acute Rehab OT Goals Patient Stated Goal: to go home tomorrow OT Goal Formulation: With patient Time For Goal Achievement: 11/07/19 Potential to Achieve Goals: Good ADL Goals Pt Will Perform Grooming: with supervision;with set-up;sitting Pt Will Perform Upper Body Bathing: with supervision;with set-up;sitting Pt Will Perform Lower Body Bathing: with min assist;sitting/lateral leans Pt Will Perform Upper Body Dressing: with supervision;with  set-up;sitting Pt Will Perform Lower Body Dressing: with min assist;sitting/lateral leans Pt Will Transfer to Toilet: with min assist;with min guard assist;bedside commode;with transfer board Pt Will Perform Toileting - Clothing Manipulation and hygiene: with min assist;with min guard assist;sitting/lateral leans  Plan Discharge plan remains appropriate    Co-evaluation                 AM-PAC OT "6 Clicks" Daily Activity     Outcome Measure   Help from another person eating meals?: A Little Help from another person taking care of personal grooming?: A Little Help from another person toileting, which includes using toliet, bedpan, or urinal?: A Lot Help from another person bathing (including washing, rinsing, drying)?: A Little Help from another person to put on and taking off regular upper body clothing?: None Help from another person to put on and taking off regular lower body clothing?: A Little 6 Click Score: 18    End of Session Equipment Utilized During Treatment: Other (comment)(left limb protector)  OT Visit Diagnosis: Other abnormalities of gait and mobility (R26.89);Pain;Other symptoms and signs involving cognitive function Pain - Right/Left: Left Pain - part of body: (residual limb)   Activity  Tolerance Patient tolerated treatment well   Patient Left with call bell/phone within reach;in chair;with chair alarm set   Nurse Communication Mobility status        Time: 6237-6283 OT Time Calculation (min): 30 min  Charges: OT General Charges $OT Visit: 1 Visit OT Treatments $Self Care/Home Management : 23-37 mins  Rosey Bath OTR/L Acute Rehabilitation Services Office: [phone]    Rebeca Alert 11/05/2019, 4:15 PM

## 2019-11-05 NOTE — Plan of Care (Signed)
  Problem: Education: Goal: Knowledge of General Education information will improve Description Including pain rating scale, medication(s)/side effects and non-pharmacologic comfort measures Outcome: Progressing   

## 2019-11-05 NOTE — Progress Notes (Signed)
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 PMH significant for right BKA, DM, liver cirrhosis, Afib,  and bipolar disorder.    PT Comments    Pt supine in bed.  He was hesitant to move OOB but agreeable to supine LE exercises.  He performed quad sets and hip abduction with noticeable weakness.  Instructed patient to roll in sidelying to work on hip extension and he became increasingly agitated and asking PTA to leave.  PTA assisted patients bed height back to low level and placed bed alarm then left room.  Will re-attempt later this week if he is more receptive.    Follow Up Recommendations  Home health PT;Supervision for mobility/OOB(pt refusing placement despite need for CIR vs. SNF.)     Equipment Recommendations  Wheelchair (measurements PT);Wheelchair cushion (measurements PT);Other (comment)(drop arm commode)    Recommendations for Other Services       Precautions / Restrictions Precautions Precautions: Fall Precaution Comments: wound VAC Required Braces or Orthoses: Other Brace Other Brace: Limb protector, needs encouragement to wear it. Has R Prosthesis in room Restrictions Weight Bearing Restrictions: Yes LLE Weight Bearing: Non weight bearing Other Position/Activity Restrictions: NWB L LE    Mobility  Bed Mobility Overal bed mobility: Modified Independent       Supine to sit: Modified independent (Device/Increase time)     General bed mobility comments: increased time but able to come to long sitting in bed with increased time due to poor core strength.  Transfers Overall transfer level: (refused to transfer oob stating his lunch was on it's way.)                  Ambulation/Gait                 Stairs             Wheelchair Mobility    Modified Rankin (Stroke Patients Only)       Balance Overall balance assessment: Needs assistance Sitting-balance support: No upper extremity supported Sitting balance-Leahy Scale: Fair Sitting balance - Comments: Able to sit in long sitting statically.                                    Cognition Arousal/Alertness: Awake/alert Behavior During Therapy: WFL for tasks assessed/performed;Agitated(initially pleasant and agreeable then became agitated when therapist tried to progress patient to perform sidelying exercises.) Overall Cognitive Status: Difficult to assess Area of Impairment: Problem solving;Safety/judgement                   Current Attention Level: Selective Memory: Decreased short-term memory;Decreased recall of precautions   Safety/Judgement: Decreased awareness of deficits;Decreased awareness of safety Awareness: Emergent Problem Solving: Slow processing General Comments: Pt initially compliant but became agitated when attempting to move patient into sidelying for next exercise.  He reports,"This therapy is the biggest bunch of bullshit." "Get the hell out of here."      Exercises Amputee Exercises Quad Sets: AROM;Both;10 reps;Supine Hip ABduction/ADduction: AAROM;Both;10 reps;Supine    General Comments        Pertinent Vitals/Pain Pain Assessment: 0-10 Pain Score: 8  Pain Location: L residual limb Pain Descriptors / Indicators: Aching;Sharp Pain Intervention(s): Monitored during session;Repositioned    Home Living  Prior Function            PT Goals (current goals can now be found in the care plan section) Acute Rehab PT Goals Patient Stated Goal: To be left alione Potential to Achieve Goals: Fair    Frequency    Min 3X/week      PT Plan Current plan remains appropriate    Co-evaluation              AM-PAC PT "6 Clicks" Mobility   Outcome Measure  Help needed turning from your back to your side while in a flat bed  without using bedrails?: None Help needed moving from lying on your back to sitting on the side of a flat bed without using bedrails?: None Help needed moving to and from a bed to a chair (including a wheelchair)?: A Little Help needed standing up from a chair using your arms (e.g., wheelchair or bedside chair)?: Total Help needed to walk in hospital room?: Total Help needed climbing 3-5 steps with a railing? : Total 6 Click Score: 14    End of Session Equipment Utilized During Treatment: Gait belt Activity Tolerance: Patient tolerated treatment well Patient left: in bed;with bed alarm set;with call bell/phone within reach Nurse Communication: Mobility status PT Visit Diagnosis: Other abnormalities of gait and mobility (R26.89);Muscle weakness (generalized) (M62.81)     Time: 1217-1226 PT Time Calculation (min) (ACUTE ONLY): 9 min  Charges:  $Therapeutic Exercise: 8-22 mins                     Bonney Leitz , PTA Acute Rehabilitation Services Pager [phone] Office [phone]     Dannya Pitkin Artis Delay 11/05/2019, 12:38 PM

## 2019-11-05 NOTE — Plan of Care (Signed)
  Problem: Pain Managment: Goal: General experience of comfort will improve Outcome: Progressing   Problem: Safety: Goal: Ability to remain free from injury will improve Outcome: Progressing   Problem: Skin Integrity: Goal: Risk for impaired skin integrity will decrease Outcome: Progressing   

## 2019-11-05 NOTE — Progress Notes (Signed)
PROGRESS NOTE  [name]:[id number] DOB: [birth year] DOA: 10/22/2019 PCP: Patient, [name] Per   LOS: 13 days   Brief Narrative / Interim history: Cory Palmer a 59 y.o.malewith medical history significant forbipolar disorder, liver cirrhosis, type 2 diabetes mellitus, recent admission with osteomyelitis, MRSA bacteremia with left foot with suspected source, and was planned for surgery left AMA on 10/21/2019 now returns requesting resumption of his care.Patient reports that he was able to attend to some business that had required to leave AMA, is now dedicated to treatment of his infection, and states that his brother is planning to come from out of town to assist him in his recovery. Patient denies any fevers or chills, has not been taking any medications since leaving the hospital, and denies any chest pain, abdominal pain, cough, or shortness of breath. He continues to have some bloody and purulent drainage from the left foot.  He eventually underwent amputation by orthopedic surgery.  Given MRSA bacteremia he is requiring IV antibiotics up until 4/7  Subjective / 24h Interval events: No complaints, feels well, no fevers, still concerned about the fact that he was unable to pay his rent which was due on 4/3.  He is hoping that he will have a place to go when he leaves the hospital tomorrow  Assessment & Plan: Principal Problem MRSA bacteremia; left foot osteomyelitis-Patient was admitted with worsening foot wound on 3/20, plain films were concerning for osteomyelitis.  Infectious disease as well as orthopedic surgery consulted, he is status post transtibial amputation on 3/24.  ID recommended IV antibiotics through April 7, difficult disposition as patient needs intravenous antibiotics and ongoing supervised medical therapy, infectious disease recommended to keep patient hospitalized to complete therapy.  He was not agreeable to SNF for CIR.  Currently on vancomycin.  Blood  cultures grew MRSA on 3/20, repeat blood cultures on 3/22 no growth.  Underwent a 2D echo which showed an EF of 60-65% and TEE on 3/26 without vegetations.  He has remained stable, has IV antibiotics up until Wednesday 4/7, after he is finishing treatment he can be discharged home -Have discussed with Dr. [name] over the phone on 4/6 to let him know that he is getting discharged on Wednesday, he plans to see the patient prior to discharge tomorrow  Active Problems Paroxysmal atrial fibrillation -Not anticoagulated prior to admission, CHADS-VASc may only be 1 (DM), but also poor candidate due to chronic anemia and poor adherence  Type II DM -A1c was 6,8% earlier this month -Check CBGs and use a low-intensity SSI for now.  Stable CBGs  CBG (last 3)  Recent Labs    11/04/19 1632 11/04/19 1954 11/05/19 0631  GLUCAP 144* 128* 103*   Hyponatremia -Likely in the setting of liver disease, sodium has remained normal  Hypomagnesemia  -Repleted yesterday, recheck tomorrow   Bipolar disorder -Continue Seroquel, Zoloft, trazodone  Anemia Iron Def Anemia -Hemoglobin overall stable, continue iron on discharge  Cirrhosis  -Appears compensated  Disposition -Patient scheduled to go home on 4/7, discussed with care management to look into whether patient will have a safe place to go or currently he is homeless.  Patient has tried to reach out to the landlord but was unable to do so, finally was able to reach her through a neighbor but not sure whether he will be allowed to pay the rent and keep his apartment.  Rent was due on 4/3.  He does not have any family or close relatives living  in the area  Scheduled Meds: . calcium carbonate  1 tablet Oral BID WC  . docusate sodium  100 mg Oral BID  . enoxaparin (LOVENOX) injection  40 mg Subcutaneous Q24H  . ferrous sulfate  325 mg Oral Q breakfast  . insulin aspart  0-5 Units Subcutaneous QHS  . insulin aspart  0-9 Units Subcutaneous  TID WC  . QUEtiapine  300 mg Oral QHS  . sertraline  50 mg Oral Daily  . sodium chloride flush  10-40 mL Intracatheter Q12H  . traZODone  150 mg Oral QHS   Continuous Infusions: . sodium chloride Stopped (11/01/19 0152)  . lactated ringers 500 mL/hr at 10/23/19 1329  . vancomycin 1,000 mg (11/05/19 0235)   PRN Meds:.acetaminophen **OR** acetaminophen, alum & mag hydroxide-simeth, HYDROmorphone (DILAUDID) injection, oxyCODONE, promethazine **OR** promethazine, senna-docusate, sodium chloride flush  DVT prophylaxis: Lovenox Code Status: Full code Family Communication: d/w patient  Patient admitted from: home Anticipated d/c place: home Barriers to d/c: Needing intravenous antibiotics, discharged on Wednesday 4/7 once antibiotics are completed and Dr. Sharol Given sees the patient  Consultants:  Infectious disease Orthopedic surgery  Procedures:  3/24 - s/p L transtibial amputation  Echo IMPRESSIONS    1. Normal LV function; trace AI; mild MR and TR; moderate LAE; no  vegetatioins.  2. Left ventricular ejection fraction, by estimation, is 55 to 60%. The  left ventricle has normal function. The left ventricle has no regional  wall motion abnormalities.  3. Right ventricular systolic function is normal. The right ventricular  size is normal.  4. Left atrial size was moderately dilated. No left atrial/left atrial  appendage thrombus was detected.  5. The mitral valve is normal in structure. Mild mitral valve  regurgitation.  6. The aortic valve is tricuspid. Aortic valve regurgitation is trivial.  Mild aortic valve sclerosis is present, with no evidence of aortic valve  stenosis.  7. There is mild (Grade II) plaque involving the descending aorta.   Antimicrobials: Vancomycin >>   Objective: Vitals:   11/04/19 1458 11/04/19 1952 11/05/19 0406 11/05/19 0730  BP: (!) 93/58 (!) 106/52 (!) 96/50 (!) 100/53  Pulse: 61 (!) 58 (!) 55 (!) 57  Resp: 17 17  17   Temp: 98.1 F  (36.7 C) 98.5 F (36.9 C) 98.3 F (36.8 C) 97.6 F (36.4 C)  TempSrc: Oral Oral Oral Oral  SpO2: 99% 98% 95% 92%  Weight:      Height:        Intake/Output Summary (Last 24 hours) at 11/05/2019 1053 Last data filed at 11/05/2019 0900 Gross per 24 hour  Intake 1080 ml  Output 1250 ml  Net -170 ml   Filed Weights   10/22/19 2329 10/25/19 0734  Weight: 113.9 kg 113.9 kg    Examination:  Constitutional: NAD, calm, comfortable Vitals:   11/04/19 1458 11/04/19 1952 11/05/19 0406 11/05/19 0730  BP: (!) 93/58 (!) 106/52 (!) 96/50 (!) 100/53  Pulse: 61 (!) 58 (!) 55 (!) 57  Resp: 17 17  17   Temp: 98.1 F (36.7 C) 98.5 F (36.9 C) 98.3 F (36.8 C) 97.6 F (36.4 C)  TempSrc: Oral Oral Oral Oral  SpO2: 99% 98% 95% 92%  Weight:      Height:       Eyes: No scleral icterus ENMT: Mucous membranes are moist.  Neck: normal, supple, no masses, no thyromegaly Respiratory: clear to auscultation bilaterally, no wheezing, no crackles. Cardiovascular: Regular rate and rhythm, no murmurs / rubs / gallops.  Abdomen: no tenderness, no masses palpated. Skin: no rashes.  Left BKA healing well without drainage or erythema Neurologic: Nonfocal   Data Reviewed: I have independently reviewed following labs and imaging studies   CBC: Recent Labs  Lab 10/30/19 0500 10/31/19 0212 11/03/19 1014 11/04/19 0348  WBC 7.0 6.6 5.4 6.1  NEUTROABS 4.6  --   --   --   HGB 10.2* 10.3* 10.8* 10.7*  HCT 32.2* 32.2* 33.9* 33.5*  MCV 95.0 95.3 95.2 95.2  PLT 194 191 155 139*   Basic Metabolic Panel: Recent Labs  Lab 10/30/19 0500 10/31/19 0212 11/03/19 0500 11/04/19 0348  NA 137 139 139 139  K 3.9 4.0 4.7 3.7  CL 107 109 112* 108  CO2 20* 20* 15* 20*  GLUCOSE 109* 139* 95 118*  BUN 12 13 14 16   CREATININE 0.64 0.64 0.86 0.59*  CALCIUM 9.2 8.6* 8.8* 9.1  MG 1.5*  --   --  1.5*  PHOS 3.9  --   --  3.4   Liver Function Tests: Recent Labs  Lab 10/30/19 0500 10/31/19 0212 11/04/19 0348    AST 22 23 25   ALT 13 14 15   ALKPHOS 160* 158* 124  BILITOT 1.2 1.2 1.1  PROT 7.5 7.3 7.3  ALBUMIN 2.5* 2.4* 2.7*   Coagulation Profile: No results for input(s): INR, PROTIME in the last 168 hours. HbA1C: No results for input(s): HGBA1C in the last 72 hours. CBG: Recent Labs  Lab 11/04/19 0637 11/04/19 1117 11/04/19 1632 11/04/19 1954 11/05/19 0631  GLUCAP 111* 121* 144* 128* 103*    No results found for this or any previous visit (from the past 240 hour(s)).   Radiology Studies: No results found.  01/04/20, MD, PhD [facility]  Between 7 am - 7 pm I am available, please contact me via Amion or Securechat  Between 7 pm - 7 am I am not available, please contact night coverage MD/APP via Amion

## 2019-11-06 LAB — GLUCOSE, CAPILLARY
Glucose-Capillary: 99 mg/dL (ref 70–99)
Glucose-Capillary: 114 mg/dL — ABNORMAL HIGH (ref 70–99)

## 2019-11-06 LAB — BASIC METABOLIC PANEL
Anion gap: 8 (ref 5–15)
BUN: 16 mg/dL (ref 6–20)
CO2: 21 mmol/L — ABNORMAL LOW (ref 22–32)
Calcium: 8.8 mg/dL — ABNORMAL LOW (ref 8.9–10.3)
Chloride: 109 mmol/L (ref 98–111)
Creatinine, Ser: 0.69 mg/dL (ref 0.61–1.24)
GFR calc Af Amer: >60 mL/min (ref >60)
GFR calc non Af Amer: >60 mL/min (ref >60)
Glucose, Bld: 119 mg/dL — ABNORMAL HIGH (ref 70–99)
Potassium: 3.9 mmol/L (ref 3.5–5.1)
Sodium: 138 mmol/L (ref 135–145)

## 2019-11-06 LAB — CBC
HCT: 32.3 % — ABNORMAL LOW (ref 39.0–52.0)
Hemoglobin: 10.5 g/dL — ABNORMAL LOW (ref 13.0–17.0)
MCH: 30.8 pg (ref 26.0–34.0)
MCHC: 32.5 g/dL (ref 30.0–36.0)
MCV: 94.7 fL (ref 80.0–100.0)
Platelets: 123 10*3/uL — ABNORMAL LOW (ref 150–400)
RBC: 3.41 MIL/uL — ABNORMAL LOW (ref 4.22–5.81)
RDW: 16.2 % — ABNORMAL HIGH (ref 11.5–15.5)
WBC: 4.7 10*3/uL (ref 4.0–10.5)
nRBC: 0 % (ref 0.0–0.2)

## 2019-11-06 MED ORDER — FERROUS SULFATE 325 (65 FE) MG PO TABS: 325.0000 mg | ORAL_TABLET | Freq: Every day | ORAL | 2 refills | Status: DC

## 2019-11-06 NOTE — Progress Notes (Signed)
Volunteers attempted to help pt into a taxi that was called to transport pt, but the taxi driver denied pt a ride due to issues getting into the car. [facility] is being called by case management to transport pt. Pt is currently very irritable and upset he had to come back to the floor.

## 2019-11-06 NOTE — Progress Notes (Signed)
Discharge paperwork given to [facility]. [facility] explained they could not take pt's wheelchair. Wheelchair has been labeled and placed in the conference room until pt's family picks it up. Pt not in distress and tolerated well.

## 2019-11-06 NOTE — Discharge Summary (Signed)
Physician Discharge Summary  Cory Palmer BJY:[id number] DOB: May 17, 1961 DOA: 10/22/2019  PCP: Patient, [name] Per  Admit date: 10/22/2019 Discharge date: 11/06/2019  Admitted From: Home Disposition:  Home with home health  Recommendations for Outpatient Follow-up:  1. Follow up with PCP in 1 week 2. Follow up with Dr. Sharol Given next week  Discharge Condition: Stable CODE STATUS: Full code Diet recommendation: Carb modified  Brief/Interim Summary: Cory Palmer a 59 y.o.malewith medical history significant forbipolar disorder, liver cirrhosis, type 2 diabetes mellitus, recent admission with osteomyelitis, MRSA bacteremia with left foot with suspected source, and was planned for surgery but left AMA on 10/21/2019.  He now returns requesting resumption of his care.Patient reports that he was able to attend to some business that had required to leave AMA and is now dedicated to treatment of his infection, states that his brother is planning to come from out of town to assist him in his recovery. Patient denies any fevers or chills, has not been taking any medications since leaving the hospital, and denies any chest pain, abdominal pain, cough, or shortness of breath. He continues to have some bloody and purulent drainage from the left foot.  He eventually underwent amputation by orthopedic surgery on 3/24.    He received IV vancomycin during his hospitalization for MRSA bacteremia under infectious disease consultation.  Discharge Diagnoses:  Principal Problem:   MRSA bacteremia Active Problems:   Diabetes mellitus type II, non insulin dependent (HCC)   S/P transmetatarsal amputation of foot, left (HCC)   Depressed bipolar disorder (HCC)   PAF (paroxysmal atrial fibrillation) (HCC)   Osteomyelitis of left foot (HCC)   Hyponatremia   MRSA bacteremia; left foot osteomyelitis -Patient was admitted with worsening foot wound on 3/20, plain films were concerning for osteomyelitis.   Infectious disease as well as orthopedic surgery consulted, he is status post left BKA on 3/24.  ID recommended IV antibiotics through 4/7. Blood cultures grew MRSA on 3/20, repeat blood cultures on 3/22 no growth.  Underwent a 2D echo which showed an EF of 60-65% and TEE on 3/26 without vegetations. -Follow-up with Dr. Sharol Given in 1 week to remove surgical staples  Paroxysmal atrial fibrillation -Not anticoagulated prior to admission, CHADS-VASc may only be 1 (DM), but also poor candidate due to chronic anemia and poor adherence  Type II DM, well controlled -A1c 6.8  Hyponatremia -Resolved  Bipolar disorder -Continue Seroquel, Zoloft, trazodone  Anemia, Iron Def Anemia -Hemoglobin overall stable, continue iron on discharge  Cirrhosis  -Appears compensated    Discharge Instructions  Discharge Instructions    Call MD for:  difficulty breathing, headache or visual disturbances   Complete by: As directed    Call MD for:  extreme fatigue   Complete by: As directed    Call MD for:  persistant dizziness or light-headedness   Complete by: As directed    Call MD for:  persistant nausea and vomiting   Complete by: As directed    Call MD for:  redness, tenderness, or signs of infection (pain, swelling, redness, odor or green/yellow discharge around incision site)   Complete by: As directed    Call MD for:  severe uncontrolled pain   Complete by: As directed    Call MD for:  temperature >100.4   Complete by: As directed    Diet Carb Modified   Complete by: As directed    Discharge instructions   Complete by: As directed    You were cared for  by a hospitalist during your hospital stay. If you have any questions about your discharge medications or the care you received while you were in the hospital after you are discharged, you can call the unit and ask to speak with the hospitalist on call if the hospitalist that took care of you is not available. Once you are discharged,  your primary care physician will handle any further medical issues. Please note that NO REFILLS for any discharge medications will be authorized once you are discharged, as it is imperative that you return to your primary care physician (or establish a relationship with a primary care physician if you do not have one) for your aftercare needs so that they can reassess your need for medications and monitor your lab values.   Increase activity slowly   Complete by: As directed      Allergies as of 11/06/2019   No Known Allergies     Medication List    STOP taking these medications   acetaminophen 325 MG tablet Commonly known as: TYLENOL   amoxicillin-clavulanate 875-125 MG tablet Commonly known as: AUGMENTIN   carvedilol 3.125 MG tablet Commonly known as: COREG   sulfamethoxazole-trimethoprim 800-160 MG tablet Commonly known as: BACTRIM DS     TAKE these medications   Accu-Chek Aviva Plus w/Device Kit   ferrous sulfate 325 (65 FE) MG tablet Take 1 tablet (325 mg total) by mouth daily with breakfast.   metFORMIN 1000 MG tablet Commonly known as: GLUCOPHAGE Take 1 tablet (1,000 mg total) by mouth daily.   multivitamin with minerals Tabs tablet Take 1 tablet by mouth daily.   QUEtiapine 300 MG tablet Commonly known as: SEROQUEL Take 300 mg by mouth at bedtime.   sertraline 50 MG tablet Commonly known as: ZOLOFT Take 50 mg by mouth daily.   traZODone 150 MG tablet Commonly known as: DESYREL Take 150 mg by mouth at bedtime.            Durable Medical Equipment  (From admission, onward)         Start     Ordered   11/01/19 [phone]  For home use only DME standard manual wheelchair with seat cushion  Once    Comments: Patient suffers from bilateral amputation which impairs their ability to perform daily activities like bathing, dressing, feeding, grooming and toileting in the home.  A cane, crutch or walker will not resolve issue with performing activities of daily  living. A wheelchair will allow patient to safely perform daily activities. Patient can safely propel the wheelchair in the home or has a caregiver who can provide assistance. Length of need Lifetime. Accessories: elevating leg rests (ELRs), wheel locks, extensions and anti-tippers.   11/01/19 8144         Follow-up Information    Newt Minion, MD In 1 week.   Specialty: Orthopedic Surgery Contact information: [address] [place] [address] [place] [facility] up.   Why: Call to establish with a new PCP, or establish with a PCP of your choice Contact information: [place] [id number] [phone]         No Known Allergies  Consultations:  Orthopedic surgery  Infectious disease   Procedures/Studies: DG Chest 1 View  Result Date: 10/19/2019 CLINICAL DATA:  Chest pain EXAM: CHEST  1 VIEW COMPARISON:  October 29, 2018 FINDINGS: The heart size is stable from prior study. There is  no pneumothorax or large pleural effusion. There is a small airspace opacity at the left lung base. There is no acute osseous abnormality. IMPRESSION: Small airspace opacity at the left lung base which could represent atelectasis or early infiltrate. Electronically Signed   By: Constance Holster M.D.   On: 10/19/2019 15:16   DG Foot Complete Left  Result Date: 10/19/2019 CLINICAL DATA:  Pain. EXAM: LEFT FOOT - COMPLETE 3+ VIEW COMPARISON:  June 20, 2019 FINDINGS: There are new extensive destructive changes involving the second through fourth metatarsals. There is overlying soft tissue swelling. There is a probable large plantar ulcer overlying the forefoot. The patient is status post prior transmetatarsal amputation. There is extensive new lucency through the midfoot with midfoot collapse. Findings may be secondary to disuse osteopenia or progressive Charcot foot. Osteomyelitis is not excluded. IMPRESSION:  1. Overall findings highly concerning for osteomyelitis involving the remaining portions of the second through fourth metatarsals. 2. Surrounding soft tissue swelling with a probable large plantar ulcer. 3. Progressive destruction of the midfoot suggestive of a Charcot joint. Osteomyelitis of the cuneiforms is not excluded on this study and can be further evaluated by MRI as clinically indicated. Electronically Signed   By: Constance Holster M.D.   On: 10/19/2019 15:19   ECHOCARDIOGRAM COMPLETE  Result Date: 10/21/2019    ECHOCARDIOGRAM REPORT   Patient Name:   Cory Palmer Date of Exam: 10/21/2019 Medical Rec #:  [id number]     Height:       76.0 in Accession #:    [id number]    Weight:       251.1 lb Date of Birth:  28-Apr-1961    BSA:          2.440 m Patient Age:    59 years      BP:           112/70 mmHg Patient Gender: M             HR:           69 bpm. Exam Location:  Inpatient Procedure: 2D Echo, Cardiac Doppler and Color Doppler Indications:    Bacteremia 790.7 / R78.81  History:        Patient has prior history of Echocardiogram examinations, most                 recent 10/05/2018. Risk Factors:Hypertension, Diabetes and Current                 Smoker.  Sonographer:    Jonelle Sidle Dance Referring Phys: [id number] [place]  1. Left ventricular ejection fraction, by estimation, is 60 to 65%. The left ventricle has normal function. The left ventricle has no regional wall motion abnormalities. There is mild left ventricular hypertrophy. Left ventricular diastolic function could not be evaluated. Left ventricular diastolic function could not be evaluated.  2. Right ventricular systolic function is normal. The right ventricular size is normal. There is mildly elevated pulmonary artery systolic pressure. The estimated right ventricular systolic pressure is 73.2 mmHg.  3. Left atrial size was moderately dilated.  4. The mitral valve is abnormal. Mild mitral valve regurgitation.  5. The aortic valve is  tricuspid. Aortic valve regurgitation is not visualized. Mild aortic valve sclerosis is present, with no evidence of aortic valve stenosis.  6. The inferior vena cava is dilated in size with <50% respiratory variability, suggesting right atrial pressure of 15 mmHg.  7. Evidence of atrial level shunting detected by color  flow Doppler. There is a possible small patent foramen ovale with predominantly left to right shunting across the atrial septum. Consider TEE bubble study if further work-up of bacteremia is considered.  8. No gross evidence of valvular endocarditis FINDINGS  Left Ventricle: Left ventricular ejection fraction, by estimation, is 60 to 65%. The left ventricle has normal function. The left ventricle has no regional wall motion abnormalities. The left ventricular internal cavity size was normal in size. There is  mild left ventricular hypertrophy. Left ventricular diastolic function could not be evaluated due to atrial fibrillation. Left ventricular diastolic function could not be evaluated. Right Ventricle: The right ventricular size is normal. No increase in right ventricular wall thickness. Right ventricular systolic function is normal. There is mildly elevated pulmonary artery systolic pressure. The tricuspid regurgitant velocity is 2.10  m/s, and with an assumed right atrial pressure of 15 mmHg, the estimated right ventricular systolic pressure is 84.5 mmHg. Left Atrium: Left atrial size was moderately dilated. Right Atrium: Right atrial size was normal in size. Pericardium: There is no evidence of pericardial effusion. Mitral Valve: The mitral valve is abnormal. There is mild calcification of the mitral valve leaflet(s). Mild mitral annular calcification. Mild mitral valve regurgitation. Tricuspid Valve: The tricuspid valve is grossly normal. Tricuspid valve regurgitation is trivial. Aortic Valve: The aortic valve is tricuspid. Aortic valve regurgitation is not visualized. Mild aortic valve  sclerosis is present, with no evidence of aortic valve stenosis. Pulmonic Valve: The pulmonic valve was grossly normal. Pulmonic valve regurgitation is not visualized. Aorta: The aortic root and ascending aorta are structurally normal, with no evidence of dilitation. Venous: The inferior vena cava is dilated in size with less than 50% respiratory variability, suggesting right atrial pressure of 15 mmHg. IAS/Shunts: Evidence of atrial level shunting detected by color flow Doppler. A small patent foramen ovale is detected with predominantly left to right shunting across the atrial septum.  LEFT VENTRICLE PLAX 2D LVIDd:         5.70 cm LVIDs:         3.56 cm LV PW:         1.59 cm LV IVS:        1.02 cm LVOT diam:     2.50 cm LV SV:         108 LV SV Index:   44 LVOT Area:     4.91 cm  RIGHT VENTRICLE             IVC RV Basal diam:  3.08 cm     IVC diam: 2.93 cm RV Mid diam:    2.71 cm RV S prime:     13.60 cm/s TAPSE (M-mode): 3.5 cm LEFT ATRIUM              Index       RIGHT ATRIUM           Index LA diam:        6.10 cm  2.50 cm/m  RA Area:     18.90 cm LA Vol (A2C):   102.0 ml 41.80 ml/m RA Volume:   45.80 ml  18.77 ml/m LA Vol (A4C):   102.0 ml 41.80 ml/m LA Biplane Vol: 103.0 ml 42.21 ml/m  AORTIC VALVE LVOT Vmax:   111.50 cm/s LVOT Vmean:  70.700 cm/s LVOT VTI:    0.220 m  AORTA Ao Root diam: 3.70 cm Ao Asc diam:  3.50 cm MITRAL VALVE  TRICUSPID VALVE MV Area (PHT): 2.56 cm     TR Peak grad:   17.6 mmHg MV Decel Time: 296 msec     TR Vmax:        210.00 cm/s MV E velocity: 118.00 cm/s                             SHUNTS                             Systemic VTI:  0.22 m                             Systemic Diam: 2.50 cm Lyman Bishop MD Electronically signed by Lyman Bishop MD Signature Date/Time: 10/21/2019/10:59:01 AM    Final    ECHO TEE  Result Date: 10/25/2019    TRANSESOPHOGEAL ECHO REPORT   Patient Name:   Cory Palmer Date of Exam: 10/25/2019 Medical Rec #:  [id number]     Height:        76.0 in Accession #:    [id number]    Weight:       251.0 lb Date of Birth:  1961-02-26    BSA:          2.440 m Patient Age:    3 years      BP:           105/73 mmHg Patient Gender: M             HR:           83 bpm. Exam Location:  Inpatient Procedure: Transesophageal Echo Indications:     Bacteremia 790.7/R78.81  History:         Patient has prior history of Echocardiogram examinations, most                  recent 10/21/2019. Arrythmias:Atrial Fibrillation; Risk                  Factors:Diabetes and Current Smoker. Osteomyelitis. MRSA                  bacteremia.  Sonographer:     Clayton Lefort RDCS (AE) Referring Phys:  [id number] [place] Diagnosing Phys: Kirk Ruths MD PROCEDURE: After discussion of the risks and benefits of a TEE, an informed consent was obtained from the patient. The transesophogeal probe was passed without difficulty through the esophogus of the patient. Sedation performed by different physician. The patient was monitored while under deep sedation. Anesthestetic sedation was provided intravenously by Anesthesiology: 205.54m of Propofol. Image quality was adequate. The patient developed no complications during the procedure. Pt sedated by anesthesia with precedex 16 mg and diprovan 200 mg IV total. IMPRESSIONS  1. Normal LV function; trace AI; mild MR and TR; moderate LAE; no vegetatioins.  2. Left ventricular ejection fraction, by estimation, is 55 to 60%. The left ventricle has normal function. The left ventricle has no regional wall motion abnormalities.  3. Right ventricular systolic function is normal. The right ventricular size is normal.  4. Left atrial size was moderately dilated. No left atrial/left atrial appendage thrombus was detected.  5. The mitral valve is normal in structure. Mild mitral valve regurgitation.  6. The aortic valve is tricuspid. Aortic valve regurgitation is trivial. Mild aortic valve sclerosis is present, with no evidence of aortic valve  stenosis.   7. There is mild (Grade II) plaque involving the descending aorta. FINDINGS  Left Ventricle: Left ventricular ejection fraction, by estimation, is 55 to 60%. The left ventricle has normal function. The left ventricle has no regional wall motion abnormalities. The left ventricular internal cavity size was normal in size. There is  no left ventricular hypertrophy. Right Ventricle: The right ventricular size is normal. Right ventricular systolic function is normal. Left Atrium: Left atrial size was moderately dilated. No left atrial/left atrial appendage thrombus was detected. Right Atrium: Right atrial size was normal in size. Pericardium: There is no evidence of pericardial effusion. Mitral Valve: The mitral valve is normal in structure. There is mild thickening of the mitral valve leaflet(s). Mild mitral valve regurgitation. Tricuspid Valve: The tricuspid valve is normal in structure. Tricuspid valve regurgitation is mild. Aortic Valve: The aortic valve is tricuspid. Aortic valve regurgitation is trivial. Mild aortic valve sclerosis is present, with no evidence of aortic valve stenosis. Pulmonic Valve: The pulmonic valve was grossly normal. Pulmonic valve regurgitation is not visualized. Aorta: The aortic root is normal in size and structure. There is mild (Grade II) plaque involving the descending aorta. IAS/Shunts: The interatrial septum appears to be lipomatous. No atrial level shunt detected by color flow Doppler. Additional Comments: Normal LV function; trace AI; mild MR and TR; moderate LAE; no vegetatioins.   AORTA Ao Root diam: 3.00 cm Kirk Ruths MD Electronically signed by Kirk Ruths MD Signature Date/Time: 10/25/2019/8:52:28 AM    Final    VAS [place] LOWER EXTREMITY VENOUS (DVT) (MC and WL 7a-7p)  Result Date: 10/20/2019  Lower Venous DVTStudy Indications: Pain, Swelling, Erythema, and infection, known osteomyelitis, attends wound center.  Risk Factors: Surgery Right BKA, left transmetatarsal  amputation. Limitations: Patient constantly moving and retching, pain with touch in calf. Comparison Study: Prior study done 10/09/18 is available for comparison Performing Technologist: Sharion Dove RVS  Examination Guidelines: A complete evaluation includes B-mode imaging, spectral Doppler, color Doppler, and power Doppler as needed of all accessible portions of each vessel. Bilateral testing is considered an integral part of a complete examination. Limited examinations for reoccurring indications may be performed as noted. The reflux portion of the exam is performed with the patient in reverse Trendelenburg.  Right Technical Findings: Right leg not evaluated.  +---------+---------------+---------+-----------+----------+--------------+ LEFT     CompressibilityPhasicitySpontaneityPropertiesThrombus Aging +---------+---------------+---------+-----------+----------+--------------+ CFV      Full           Yes      Yes                                 +---------+---------------+---------+-----------+----------+--------------+ SFJ      Full                                                        +---------+---------------+---------+-----------+----------+--------------+ FV Prox  Full                                                        +---------+---------------+---------+-----------+----------+--------------+ FV Mid   Full                                                        +---------+---------------+---------+-----------+----------+--------------+  FV DistalFull                                                        +---------+---------------+---------+-----------+----------+--------------+ PFV      Full                                                        +---------+---------------+---------+-----------+----------+--------------+ POP      Full           Yes                                           +---------+---------------+---------+-----------+----------+--------------+ PTV      Full                                                        +---------+---------------+---------+-----------+----------+--------------+ PERO                                                  Not visualized +---------+---------------+---------+-----------+----------+--------------+     Summary: LEFT: - Findings appear essentially unchanged compared to previous examination. - There is no evidence of deep vein thrombosis in the lower extremity. However, portions of this examination were limited- see technologist comments above.  *See table(s) above for measurements and observations. Electronically signed by Ruta Hinds MD on 10/20/2019 at 12:37:42 PM.    Final    [place] EKG SITE RITE  Result Date: 10/26/2019 If Site Rite image not attached, placement could not be confirmed due to current cardiac rhythm.  [place] EKG SITE RITE  Result Date: 10/24/2019 If Site Rite image not attached, placement could not be confirmed due to current cardiac rhythm.  US Abdomen Limited RUQ  Result Date: 10/19/2019 CLINICAL DATA:  Pain EXAM: ULTRASOUND ABDOMEN LIMITED RIGHT UPPER QUADRANT COMPARISON:  October 30, 2018 FINDINGS: Gallbladder: No gallstones or wall thickening visualized. No sonographic Murphy sign noted by sonographer. Common bile duct: Diameter: 5 mm Liver: The liver is cirrhotic with a coarsened hepatic echotexture. There is no discrete hepatic mass. There is recanalization of the umbilical vein consistent with underlying portal hypertension. Portal vein is patent on color Doppler imaging with normal direction of blood flow towards the liver. Other: None. IMPRESSION: 1. No acute abnormality.  No evidence for cholelithiasis. 2. Cirrhosis with stigmata of portal hypertension. Electronically Signed   By: Constance Holster M.D.   On: 10/19/2019 17:41       Discharge Exam: Vitals:   11/06/19 0358 11/06/19 0739  BP: 107/73  (!) 100/56  Pulse: 60 66  Resp: 17 18  Temp: 97.8 F (36.6 C) 97.7 F (36.5 C)  SpO2: 96% 92%    General: Pt is alert, awake, not in acute distress Cardiovascular: RRR, S1/S2 +, no edema Respiratory: CTA bilaterally,  no wheezing, no rhonchi, no respiratory distress, no conversational dyspnea  Abdominal: Soft, NT, ND, bowel sounds + Extremities: no edema, no cyanosis, bilateral BKA Psych: Normal mood and affec    The results of significant diagnostics from this hospitalization (including imaging, microbiology, ancillary and laboratory) are listed below for reference.     Microbiology: No results found for this or any previous visit (from the past 240 hour(s)).   Labs: BNP (last 3 results) No results for input(s): BNP in the last 8760 hours. Basic Metabolic Panel: Recent Labs  Lab 10/31/19 0212 11/03/19 0500 11/04/19 0348 11/06/19 0217  NA 139 139 139 138  K 4.0 4.7 3.7 3.9  CL 109 112* 108 109  CO2 20* 15* 20* 21*  GLUCOSE 139* 95 118* 119*  BUN 13 14 16 16   CREATININE 0.64 0.86 0.59* 0.69  CALCIUM 8.6* 8.8* 9.1 8.8*  MG  --   --  1.5*  --   PHOS  --   --  3.4  --    Liver Function Tests: Recent Labs  Lab 10/31/19 0212 11/04/19 0348  AST 23 25  ALT 14 15  ALKPHOS 158* 124  BILITOT 1.2 1.1  PROT 7.3 7.3  ALBUMIN 2.4* 2.7*   No results for input(s): LIPASE, AMYLASE in the last 168 hours. No results for input(s): AMMONIA in the last 168 hours. CBC: Recent Labs  Lab 10/31/19 0212 11/03/19 1014 11/04/19 0348 11/06/19 0217  WBC 6.6 5.4 6.1 4.7  HGB 10.3* 10.8* 10.7* 10.5*  HCT 32.2* 33.9* 33.5* 32.3*  MCV 95.3 95.2 95.2 94.7  PLT 191 155 139* 123*   Cardiac Enzymes: No results for input(s): CKTOTAL, CKMB, CKMBINDEX, TROPONINI in the last 168 hours. BNP: Invalid input(s): POCBNP CBG: Recent Labs  Lab 11/04/19 1954 11/05/19 0631 11/05/19 1200 11/05/19 1643 11/06/19 0639  GLUCAP 128* 103* 111* 127* 99   D-Dimer No results for input(s):  DDIMER in the last 72 hours. Hgb A1c No results for input(s): HGBA1C in the last 72 hours. Lipid Profile No results for input(s): CHOL, HDL, LDLCALC, TRIG, CHOLHDL, LDLDIRECT in the last 72 hours. Thyroid function studies No results for input(s): TSH, T4TOTAL, T3FREE, THYROIDAB in the last 72 hours.  Invalid input(s): FREET3 Anemia work up No results for input(s): VITAMINB12, FOLATE, FERRITIN, TIBC, IRON, RETICCTPCT in the last 72 hours. Urinalysis    Component Value Date/Time   COLORURINE AMBER (A) 10/05/2018 1153   APPEARANCEUR CLEAR 10/05/2018 1153   LABSPEC 1.034 (H) 10/05/2018 1153   PHURINE 5.0 10/05/2018 1153   GLUCOSEU NEGATIVE 10/05/2018 1153   HGBUR NEGATIVE 10/05/2018 1153   BILIRUBINUR SMALL (A) 10/05/2018 1153   KETONESUR 5 (A) 10/05/2018 1153   PROTEINUR 30 (A) 10/05/2018 1153   NITRITE NEGATIVE 10/05/2018 1153   LEUKOCYTESUR NEGATIVE 10/05/2018 1153   Sepsis Labs Invalid input(s): PROCALCITONIN,  WBC,  LACTICIDVEN Microbiology No results found for this or any previous visit (from the past 240 hour(s)).   Patient was seen and examined on the day of discharge and was found to be in stable condition. Time coordinating discharge: 40 minutes including assessment and coordination of care, as well as examination of the patient.   SIGNED:  Dessa Phi, DO [facility] 11/06/2019, 10:46 AM

## 2019-11-06 NOTE — Progress Notes (Signed)
Pt refusing 1300 dose of IV antx. Pt states that he has to leave the hospital "right now" due to issues regarding his home situation. Notified Dr. Noralee Stain about pt refusing to stay for his IV antx.

## 2019-11-06 NOTE — Progress Notes (Signed)
Patient wants to go home today if possible. S/P Left BKA. Incicision healing well no drainage. Surgical staples in place. From ortho standpoint can discharge with follow up next week with Dr [name] to remove surgical staples. Should change shrinker daily. May cleanse incision with mild soap and water. Should not soak

## 2019-11-06 NOTE — TOC Transition Note (Addendum)
Transition of Care [facility]) - CM/SW Discharge Note   Patient Details  Name: Cory Palmer MRN: [id number] Date of Birth: 10-20-1960  Transition of [facility]) CM/SW Contact:  Epifanio Lesches, RN Phone Number: [phone] 11/06/2019, 12:59 PM   Clinical Narrative:    Late entry: 12:15 Pt will transition to home today. Pt declined SNF placement. Adamantly states he's going home. Agreeable HH services. [facility] accepted pt for home health services (PT,OT) after choice given and selected by pt. DME : wheelchair will be delivered to bedside prior to d/c. Pt requested taxi service for transportation to home. [employer] service arranged for transportation to home. However, once [employer] saw pt needs exceeded their limitations, pt with bilateral BKA's ...needing help to get in car and assistance with W/C. They were unable to assist pt to home.    [facility] services arranged for pt's transportation to home. Brother called @ [phone] to make him aware of pt's d/c plan.  Final next level of care: Home w Home Health Services Barriers to Discharge: No Barriers Identified   Patient Goals and CMS Choice Patient states their goals for this hospitalization and ongoing recovery are:: I need to get home to take care of things CMS Medicare.gov Compare Post Acute Care list provided to:: Patient Choice offered to / list presented to : Patient  Discharge Placement                       Discharge Plan and Services     Post Acute Care Choice: Durable Medical Equipment, Home Health          DME Arranged: Wheelchair manual DME Agency: [employer] Date DME Agency Contacted: 11/04/19 Time DME Agency Contacted: 1525 Representative spoke with at DME Agency: Ian Malkin HH Arranged: PT, OT, Nurse's Aide  HH: agency: [facility]        Social Determinants of Health (SDOH) Interventions     Readmission Risk Interventions Readmission Risk Prevention Plan 10/21/2019  Transportation  Screening Complete  PCP or Specialist Appt within 5-7 Days Complete  Home Care Screening Complete  Medication Review (RN CM) Complete  Some recent data might be hidden

## 2019-11-08 ENCOUNTER — Telehealth: Payer: Self-pay | Admitting: Orthopedic Surgery

## 2019-11-08 NOTE — Telephone Encounter (Signed)
Almira with home health called with an update on Mr. Meddaugh.  She states that were not able to "track him down" by phone, so she did a drive by and saw that he was home.  Mr. Melendrez has no wheelchair or sliding board and is bascially crawling around his home.  He now has a "skin tear" on his left knee due to all the crawling.  She checked on the wheelchair and [facility] stated that it was delivered to the wrong address.  She asked them to please send it out to the correct address ASAP.  They are requesting verbal orders for a nursing eval to take a look at the skin tear.  She also states that the patient has been touching his incisions.  Please all to discuss at [phone].

## 2019-11-08 NOTE — Telephone Encounter (Signed)
I called and sw HHN to advise of the previous message. It looks like the pt already received a wheelchair at the hospital and that when he refused to go to SNF that he was brought by EMS and they advised could not bring with him and labeled it placed in [facility] conference room and told him family had to come and pick up. I am not sure that the order that I placed today will go through because of this. I was not aware that [facility] was seeing the pt but advised that whatever iut was that she needed I would take care of for the pt. Verbal ok to treat the skin tear and that she could apply 4x4, Kerlix and ace to the BKA.

## 2019-11-08 NOTE — Telephone Encounter (Signed)
I called and sw pt and and asked if he had friends or family that could help him and he advised no he did not. He stated that he was crawling on the floor, had no food in the house and no way out of his home.Pt states that we " cut peoples legs off and then do not help them do anything after"  I spoke with [facility] and they advised that insurance will not cover services as he has a higher need than they can provide, adult protective services will not assist as the pt is not being neglected or harmed by someone and is of sound mind only option would be to have the pt call EMS and go back to the hospital to be admitted for failure to thrive and seek SNF placement. Pt does not want to do this at all. He said that he " is fine" I advised that I could place an order for a wheelchair and a slide board and that this will at least help him to be mobile in and out of the home.

## 2019-11-08 NOTE — Telephone Encounter (Signed)
Patient called. He says he does not know how he will get here to get stiches taken out. Ask for someone to call him. His number is [phone]

## 2019-11-11 ENCOUNTER — Telehealth: Payer: Self-pay | Admitting: Orthopedic Surgery

## 2019-11-11 NOTE — Telephone Encounter (Signed)
Will from [facility] called to request VO for the following:  2x a week for 1 week 1x a week for 1 week.  Also checking on order for hospital bed.  PE#[phone].  Thank you.

## 2019-11-12 ENCOUNTER — Telehealth: Payer: Self-pay | Admitting: Orthopedic Surgery

## 2019-11-12 NOTE — Telephone Encounter (Signed)
Alex from [employer].   They are requesting that the patient be given a prescription for an electric wheelchair.   Call back: [phone]

## 2019-11-12 NOTE — Telephone Encounter (Signed)
I called and sw Will gave verbal ok for orders below. Pt would not qualify for a hospital bed there is not a medical need that we can give for the order. Did also advise that the pt does have a manual wheelchair at the hospital but now the pt does not want that he wants a power chair so that he can get to and from the store and states that he does not have the upper body strength to self propel. Advised that he will speak with the nurse that is seeing him about the wheelchair and also suggested social worker eval but the pt has declined this as well.

## 2019-11-13 NOTE — Progress Notes (Signed)
This encounter was created in error - please disregard.

## 2019-11-13 NOTE — Telephone Encounter (Signed)
Pt now wants a motorized wheelchair

## 2019-11-14 ENCOUNTER — Ambulatory Visit: Payer: Medicare Other | Attending: Family Medicine | Admitting: Physician Assistant

## 2019-11-14 ENCOUNTER — Other Ambulatory Visit: Payer: Self-pay

## 2019-11-15 NOTE — Telephone Encounter (Signed)
HHN called [phone] request to get social work out to see what community based programs the pt qualifies for also advised that the pt does have a manuel wheelchair and that unless home health pt has deemed a motorized chair necessary wanted to let them know what that he does have a wheelchair at the hospital he just needs to pick this up.

## 2019-11-19 ENCOUNTER — Emergency Department (HOSPITAL_COMMUNITY)
Admission: EM | Admit: 2019-11-19 | Discharge: 2019-11-19 | Disposition: A | Discharge status: Home / Self Care | Payer: Medicare Other | Attending: Emergency Medicine | Admitting: Emergency Medicine

## 2019-11-19 ENCOUNTER — Emergency Department (HOSPITAL_COMMUNITY): Payer: Medicare Other

## 2019-11-19 ENCOUNTER — Ambulatory Visit: Payer: Medicare Other | Admitting: Family

## 2019-11-19 ENCOUNTER — Telehealth: Payer: Self-pay | Admitting: Orthopedic Surgery

## 2019-11-19 ENCOUNTER — Other Ambulatory Visit: Payer: Self-pay

## 2019-11-19 ENCOUNTER — Encounter (HOSPITAL_COMMUNITY): Payer: Self-pay

## 2019-11-19 DIAGNOSIS — E119 Type 2 diabetes mellitus without complications: Secondary | ICD-10-CM | POA: Insufficient documentation

## 2019-11-19 DIAGNOSIS — G8918 Other acute postprocedural pain: Secondary | ICD-10-CM | POA: Insufficient documentation

## 2019-11-19 DIAGNOSIS — F17210 Nicotine dependence, cigarettes, uncomplicated: Secondary | ICD-10-CM | POA: Insufficient documentation

## 2019-11-19 DIAGNOSIS — Z79899 Other long term (current) drug therapy: Secondary | ICD-10-CM | POA: Insufficient documentation

## 2019-11-19 DIAGNOSIS — I10 Essential (primary) hypertension: Secondary | ICD-10-CM | POA: Insufficient documentation

## 2019-11-19 DIAGNOSIS — Z89512 Acquired absence of left leg below knee: Secondary | ICD-10-CM | POA: Diagnosis not present

## 2019-11-19 DIAGNOSIS — Z7984 Long term (current) use of oral hypoglycemic drugs: Secondary | ICD-10-CM | POA: Diagnosis not present

## 2019-11-19 LAB — CBC WITH DIFFERENTIAL/PLATELET
Abs Immature Granulocytes: 0.04 10*3/uL (ref 0.00–0.07)
Basophils Absolute: 0 10*3/uL (ref 0.0–0.1)
Basophils Relative: 0 %
Eosinophils Absolute: 0.4 10*3/uL (ref 0.0–0.5)
Eosinophils Relative: 4 %
HCT: 34.8 % — ABNORMAL LOW (ref 39.0–52.0)
Hemoglobin: 11.2 g/dL — ABNORMAL LOW (ref 13.0–17.0)
Immature Granulocytes: 0 %
Lymphocytes Relative: 17 %
Lymphs Abs: 1.6 10*3/uL (ref 0.7–4.0)
MCH: 30.9 pg (ref 26.0–34.0)
MCHC: 32.2 g/dL (ref 30.0–36.0)
MCV: 96.1 fL (ref 80.0–100.0)
Monocytes Absolute: 1 10*3/uL (ref 0.1–1.0)
Monocytes Relative: 10 %
Neutro Abs: 6.6 10*3/uL (ref 1.7–7.7)
Neutrophils Relative %: 69 %
Platelets: 145 10*3/uL — ABNORMAL LOW (ref 150–400)
RBC: 3.62 MIL/uL — ABNORMAL LOW (ref 4.22–5.81)
RDW: 15.9 % — ABNORMAL HIGH (ref 11.5–15.5)
WBC: 9.5 10*3/uL (ref 4.0–10.5)
nRBC: 0 % (ref 0.0–0.2)

## 2019-11-19 LAB — COMPREHENSIVE METABOLIC PANEL
ALT: 14 U/L (ref 0–44)
AST: 19 U/L (ref 15–41)
Albumin: 3.4 g/dL — ABNORMAL LOW (ref 3.5–5.0)
Alkaline Phosphatase: 105 U/L (ref 38–126)
Anion gap: 9 (ref 5–15)
BUN: 10 mg/dL (ref 6–20)
CO2: 19 mmol/L — ABNORMAL LOW (ref 22–32)
Calcium: 8.4 mg/dL — ABNORMAL LOW (ref 8.9–10.3)
Chloride: 109 mmol/L (ref 98–111)
Creatinine, Ser: 0.57 mg/dL — ABNORMAL LOW (ref 0.61–1.24)
GFR calc Af Amer: >60 mL/min (ref >60)
GFR calc non Af Amer: >60 mL/min (ref >60)
Glucose, Bld: 109 mg/dL — ABNORMAL HIGH (ref 70–99)
Potassium: 3.6 mmol/L (ref 3.5–5.1)
Sodium: 137 mmol/L (ref 135–145)
Total Bilirubin: 2.1 mg/dL — ABNORMAL HIGH (ref 0.3–1.2)
Total Protein: 7.4 g/dL (ref 6.5–8.1)

## 2019-11-19 LAB — URINALYSIS, ROUTINE W REFLEX MICROSCOPIC
Bilirubin Urine: NEGATIVE
Glucose, UA: 50 mg/dL — AB
Hgb urine dipstick: NEGATIVE
Ketones, ur: NEGATIVE mg/dL
Leukocytes,Ua: NEGATIVE
Nitrite: NEGATIVE
Protein, ur: NEGATIVE mg/dL
Specific Gravity, Urine: 1.015 (ref 1.005–1.030)
pH: 6 (ref 5.0–8.0)

## 2019-11-19 LAB — LACTIC ACID, PLASMA: Lactic Acid, Venous: 1.3 mmol/L (ref 0.5–1.9)

## 2019-11-19 MED ORDER — VANCOMYCIN HCL IN DEXTROSE 1-5 GM/200ML-% IV SOLN
1000.0000 mg | Freq: Once | INTRAVENOUS | Status: AC
  Administered 2019-11-19: 1000 mg via INTRAVENOUS
  Filled 2019-11-19: qty 200

## 2019-11-19 MED ORDER — SODIUM CHLORIDE 0.9% FLUSH: 3.0000 mL | Freq: Once | INTRAVENOUS | Status: DC

## 2019-11-19 MED ORDER — SODIUM CHLORIDE (PF) 0.9 % IJ SOLN
INTRAMUSCULAR | Status: AC
  Filled 2019-11-19: qty 50

## 2019-11-19 MED ORDER — OXYCODONE-ACETAMINOPHEN 5-325 MG PO TABS
2.0000 | ORAL_TABLET | Freq: Once | ORAL | Status: AC
  Administered 2019-11-19: 18:00:00 2 via ORAL
  Filled 2019-11-19: qty 2

## 2019-11-19 MED ORDER — DOXYCYCLINE HYCLATE 100 MG PO CAPS: 100.0000 mg | ORAL_CAPSULE | Freq: Two times a day (BID) | ORAL | 0 refills | Status: AC

## 2019-11-19 MED ORDER — DOXYCYCLINE HYCLATE 100 MG PO CAPS: 100.0000 mg | ORAL_CAPSULE | Freq: Two times a day (BID) | ORAL | 0 refills | Status: DC

## 2019-11-19 MED ORDER — PIPERACILLIN-TAZOBACTAM 3.375 G IVPB 30 MIN
3.3750 g | Freq: Once | INTRAVENOUS | Status: AC
  Administered 2019-11-19: 3.375 g via INTRAVENOUS
  Filled 2019-11-19: qty 50

## 2019-11-19 MED ORDER — IOHEXOL 300 MG/ML  SOLN
100.0000 mL | Freq: Once | INTRAMUSCULAR | Status: AC | PRN
  Administered 2019-11-19: 100 mL via INTRAVENOUS

## 2019-11-19 NOTE — ED Notes (Signed)
IV consult bedside 

## 2019-11-19 NOTE — ED Provider Notes (Signed)
[facility] DEPT Provider Note   CSN: [id number] Arrival date & time: 11/19/19  1547     History Chief Complaint  Patient presents with  . Leg Pain    Cory Palmer is a 59 y.o. male.  59 y.o. male with medical history significant for bipolar disorder, liver cirrhosis, type 2 diabetes mellitus, recent admission with osteomyelitis, MRSA bacteremia with left foot resulting in BKA by Dr. Sharol Given on 3/24 presenting to the ER for increased painand redness from his stump site. Was supposed to see Dr. Sharol Given in office today but did not have transportation so ended up in the ER today. Reports significant pain and swelling and redness for 2 days. Staples still in place. He is upset that he has not been given a motorized scooter to help him get around. He lives alone.         Past Medical History:  Diagnosis Date  . Anxiety   . Depressed bipolar disorder ([place])   . Diabetes mellitus without complication ([place])   . Hypertension   . Osteomyelitis of toe of left foot [facility])     Patient Active Problem List   Diagnosis Date Noted  . Hyponatremia 10/23/2019  . MRSA bacteremia 10/21/2019  . Atrial flutter with rapid ventricular response ([place]) 10/21/2019  . Lactic acidosis 10/21/2019  . Severe protein-calorie malnutrition ([place])   . Diabetic polyneuropathy associated with type 2 diabetes mellitus ([place])   . Osteomyelitis of left foot ([place]) 10/19/2019  . Cirrhosis of liver ([place]) 10/19/2019  . Anemia 10/19/2019  . Medication monitoring encounter 08/15/2019  . Serum total bilirubin elevated 10/29/2018  . BPH (benign prostatic hyperplasia) 10/29/2018  . Non-pressure chronic ulcer of other part of left foot limited to breakdown of skin ([place])   . Below-knee amputation of right lower extremity ([place])   . Hx of BKA, right ([place])   . PAF (paroxysmal atrial fibrillation) ([place])   . Hypokalemia   . Hypomagnesemia   . Sepsis without acute organ dysfunction ([place]) 10/05/2018  .  Depressed bipolar disorder ([place]) 10/05/2018  . Hypertension 10/05/2018  . Hyperbilirubinemia 10/05/2018  . S/P transmetatarsal amputation of foot, left ([place]) 09/28/2018  . Wound infection 05/07/2018  . Homelessness 05/07/2018  . Diabetes mellitus type II, non insulin dependent ([place]) 05/07/2018    Past Surgical History:  Procedure Laterality Date  . AMPUTATION Left 09/28/2018   Procedure: LEFT TRANSMETATARSAL AMPUTATION;  Surgeon: Newt Minion, MD;  Location: [facility];  Service: Orthopedics;  Laterality: Left;  . AMPUTATION Left 10/23/2019   Procedure: AMPUTATION BELOW KNEE;  Surgeon: Newt Minion, MD;  Location: [facility];  Service: Orthopedics;  Laterality: Left;  . BELOW KNEE LEG AMPUTATION Right   . TEE WITHOUT CARDIOVERSION N/A 10/25/2019   Procedure: TRANSESOPHAGEAL ECHOCARDIOGRAM (TEE);  Surgeon: Lelon Perla, MD;  Location: [facility] ENDOSCOPY;  Service: Cardiovascular;  Laterality: N/A;       Family History  Problem Relation Age of Onset  . Hypertension Other   . Diabetes Mellitus II Other     Social History   Tobacco Use  . Smoking status: Current Every Day Smoker    Types: Cigarettes  . Smokeless tobacco: Never Used  Substance Use Topics  . Alcohol use: Not Currently    Comment: occasional  . Drug use: Not Currently    Types: Cocaine    Home Medications Prior to Admission medications   Medication Sig Start Date End Date Taking? Authorizing Provider  metFORMIN (GLUCOPHAGE) 1000 MG tablet  Take 1 tablet (1,000 mg total) by mouth daily. 10/21/19  Yes Johnson, Clanford L, MD  Multiple Vitamin (MULTIVITAMIN WITH MINERALS) TABS tablet Take 1 tablet by mouth daily. 05/11/18  Yes Danford, Suann Larry, MD  QUEtiapine (SEROQUEL) 300 MG tablet Take 300 mg by mouth at bedtime. 08/01/19  Yes [provider]  sertraline (ZOLOFT) 50 MG tablet Take 50 mg by mouth daily. 08/01/19  Yes [provider]  traZODone (DESYREL) 150 MG tablet Take 150 mg by mouth at  bedtime.   Yes [provider]  Blood Glucose Monitoring Suppl (ACCU-CHEK AVIVA PLUS) w/Device KIT  05/23/19   [provider]  ferrous sulfate 325 (65 FE) MG tablet Take 1 tablet (325 mg total) by mouth daily with breakfast. 11/06/19   Dessa Phi, DO    Allergies    Patient has no known allergies.  Review of Systems   Review of Systems  Constitutional: Negative for chills and fever.  Respiratory: Negative for cough and shortness of breath.   Musculoskeletal: Positive for arthralgias.  Skin: Positive for color change.  Allergic/Immunologic: Positive for immunocompromised state.  Hematological: Does not bruise/bleed easily.    Physical Exam Updated Vital Signs BP 133/84 (BP Location: Left Arm)   Pulse 81   Temp 98.9 F (37.2 C)   Resp 18   Ht 6' 4"  (1.93 m)   Wt 63.5 kg   SpO2 98%   BMI 17.04 kg/m   Physical Exam Vitals and nursing note reviewed.  Constitutional:      General: He is not in acute distress.    Appearance: Normal appearance. He is not ill-appearing, toxic-appearing or diaphoretic.  HENT:     Head: Normocephalic.  Eyes:     Conjunctiva/sclera: Conjunctivae normal.  Pulmonary:     Effort: Pulmonary effort is normal.  Skin:    General: Skin is dry.     Comments: Left stump is erythematous, swollen and very TTP. Staples in place with some bloody and purulent drainage.   Neurological:     Mental Status: He is alert.  Psychiatric:        Mood and Affect: Mood normal.     ED Results / Procedures / Treatments   Labs (all labs ordered are listed, but only abnormal results are displayed) Labs Reviewed  CULTURE, BLOOD (ROUTINE X 2)  CULTURE, BLOOD (ROUTINE X 2)  LACTIC ACID, PLASMA  LACTIC ACID, PLASMA  COMPREHENSIVE METABOLIC PANEL  CBC WITH DIFFERENTIAL/PLATELET  URINALYSIS, ROUTINE W REFLEX MICROSCOPIC    EKG None  Radiology No results found.  Procedures Procedures (including critical care time)  Medications Ordered in  ED Medications  sodium chloride flush (NS) 0.9 % injection 3 mL (has no administration in time range)  oxyCODONE-acetaminophen (PERCOCET/ROXICET) 5-325 MG per tablet 2 tablet (2 tablets Oral Given 11/19/19 1746)    ED Course  I have reviewed the triage vital signs and the nursing notes.  Pertinent labs & imaging results that were available during my care of the patient were reviewed by me and considered in my medical decision making (see chart for details).  Clinical Course as of Nov 20 1310  Tue Nov 19, 2019  1831 Patient with recent left BKA presenting for more swollen, painful, red surgical site. Appears infected on exam. Patient otherwise stable appearing. Labs and Ct ordered as well as wound culture. Patient is a difficult stick and there has been a delay in getting blood/IV due to this.   [KM]  2107 Patient is not  septic appearing. WBC reassuring. However, CT scan shows loculated abscess. Patient getting IV abx with MRSA coverage. Pending Dr. Sharol Given consult.    [KM]  2118 I spoke with Dr. Lorin Mercy from orthopedics. CT was reviewed with him and my concern for abscess and cellulitis was discussed as CT read findings consistent with such. Dr. Lorin Mercy feels strongly that Ct scan findings reflect hematoma and not abscess given his normal labs and afebrile despite Ct scan readings. Patient was given vanc and zosyn here in ED. Dr. Lorin Mercy feels patient can f/u with Dr. Sharol Given in the office.  Dr. Roslynn Amble to assess patient as well and dispo.    [KM]    Clinical Course User Index [KM] Kristine Royal   MDM Rules/Calculators/A&P                       Final Clinical Impression(s) / ED Diagnoses Final diagnoses:  None    Rx / DC Orders ED Discharge Orders    None       Kristine Royal 11/20/19 1312    Lucrezia Starch, MD 11/20/19 [phone]

## 2019-11-19 NOTE — ED Notes (Signed)
Unsuccessful IV attempt x2.  

## 2019-11-19 NOTE — Care Management (Signed)
  [facility] Medication Assistance Card Name: [name]  ID (MRN): [id number] Bin: [id number] RX Group: [id number] Discharge Date: 11/19/2019 Expiration Date: 11/30/2019                                           (must be filled within 7 days of discharge)      Dear : Cory Palmer  You have been approved to have the prescriptions written by your discharging physician filled through our [facility] (Medication Assistance Through [facility]) program. This program allows for a one-time (no refills) 34-day supply of selected medications for a low copay amount.  The copay is $3.00 per prescription. For instance, if you have one prescription, you will pay $3.00; for two prescriptions, you pay $6.00; for three prescriptions, you pay $9.00; and so on.  Only certain pharmacies are participating in this program with [facility]. You will need to select one of the pharmacies from the attached list and take your prescriptions, this letter, and your photo ID to one of the participating pharmacies.   We are excited that you are able to use the [facility] program to get your medications. These prescriptions must be filled within 7 days of hospital discharge or they will no longer be valid for the [facility] program. Should you have any problems with your prescriptions please contact your case management team member at [phone] for [place]/ [facility].  Thank you, [facility] Health Care Management

## 2019-11-19 NOTE — Discharge Instructions (Signed)
Please take the antibiotic as prescribed.  It is very important that you have close follow-up with Dr. [name].  Ideally should be seen tomorrow.  If the area of redness and swelling worsens, you develop fever, please return to the emergency room for reassessment.

## 2019-11-19 NOTE — Care Management (Signed)
MATCH letter canceled patient insurance was not noted in record when call was received.  Patient needs assistance with transportation to and from [place], CM provided info for [facility]

## 2019-11-19 NOTE — ED Provider Notes (Signed)
MSE was initiated and I personally evaluated the patient and placed orders (if any) at  4:12 PM on November 19, 2019.  58yo male with recent BKA with Dr. [name], now with swelling, redness, drainage from incision site, no fever. Staples in place, swelling, erythema, tenderness, purulent appearance at staple sites. No streaking, afebrile in triage. Did not have transport to post op appt today.  The patient appears stable so that the remainder of the MSE may be completed by another provider.   Jeannie Fend, PA-C 11/19/19 1613    Bethann Berkshire, MD 11/19/19 [phone]

## 2019-11-19 NOTE — Telephone Encounter (Signed)
Meredith from [facility] called.   She wanted to reschedule the patient after he no showed his appointment today. He is a post op patient and she fears he is running the risk of infection.  I rescheduled the appointment but transportation wont allow him to be here until Monday.   Call back: [phone]

## 2019-11-19 NOTE — ED Triage Notes (Addendum)
Per EMS- Patient is from home. Patient had a Left BKA surgery in March/2021. Patient had a dr.'s follow up appointment today, but did not have transportation to go.  Left BKA  red, edematous and painful. Staples are  Still in the incision. Patient states they were suppose to take his staples out today. EMS vitals  CBG-196, BP-131/66, HR-96, and Room air sats 100%.

## 2019-11-20 NOTE — Telephone Encounter (Signed)
This is a BKA I am not sure what else we can do aside from see the pt in the office. Just wanted to make someone aware.

## 2019-11-24 LAB — CULTURE, BLOOD (ROUTINE X 2)
Culture: NO GROWTH
Culture: NO GROWTH
Special Requests: ADEQUATE

## 2019-11-25 ENCOUNTER — Telehealth: Payer: Self-pay

## 2019-11-25 ENCOUNTER — Ambulatory Visit: Payer: Medicare Other | Admitting: Orthopedic Surgery

## 2019-11-25 LAB — AEROBIC/ANAEROBIC CULTURE W GRAM STAIN (SURGICAL/DEEP WOUND)

## 2019-11-25 NOTE — Telephone Encounter (Signed)
Cory Palmer with [facility] called and stated pt's antibiotic given by the ED is coming up in her system with a interaction to his iron pill. She also stated pt still needs a electric wheelchair.  Her CB # [phone]

## 2019-11-25 NOTE — Telephone Encounter (Signed)
I called and sw HHN to advise that the pt was a no show at his office visit today and that we need to see him in the office. Per Dr. [name] it is more important for him to take the abx and to hold the iron at this time until his abx is complete. Advised that we need to set him up with a non emergent EMS transport to our office so Dr. [name] can eval the pt. I will alos called [facility] to see if they can pick up the wheelchair that was delivered to the hospital and never used by the pt and possibly deliver to the home. I am not sure that we can do this but will investigate options. Will hold message pending return call from the nurse to advise on status of transportation.

## 2019-11-26 ENCOUNTER — Telehealth: Payer: Self-pay | Admitting: Emergency Medicine

## 2019-11-26 NOTE — Telephone Encounter (Signed)
I called and sw [place] [facility] to advise that [employer] has picked up the wheelchair from the hospital and is delivering to the pt today. I will call and make an appt for him to come in next week ( needs three business days to arrange for medical transportation) will call tomorrow.

## 2019-11-26 NOTE — Telephone Encounter (Signed)
Post ED Visit - Positive Culture Follow-up: Successful Patient Follow-Up  Culture assessed and recommendations reviewed by:  []  , Pharm.D. []  Enzo Bi, .D., BCPS AQ-ID []  Celedonio Miyamoto, Pharm.D., BCPS []  [address], Pharm.D., BCPS []  [place], Garvin Fila.D., BCPS, AAHIVP []  , Pharm.D., BCPS, AAHIVP []  Georgina Pillion, PharmD, BCPS []  , PharmD, BCPS []  [place], PharmD, BCPS []  [place], PharmD PharmD  Positive wound culture  []  Patient discharged without antimicrobial prescription and treatment is now indicated [x]  Organism is resistant to prescribed ED discharge antimicrobial []  Patient with positive blood cultures  Changes discussed with ED provider: Estella Husk PA New antibiotic prescription stop Doxycycline, start Bactrim DS 2 tabs po bid x 10 days followup with ortho  Attempting to contact patient   11/26/2019, 3:25 PM

## 2019-11-26 NOTE — Telephone Encounter (Signed)
I called and sw Cory Palmer with [facility] and he states that he will locate the wheelchair at the hospital and set it up for delivery at his home and will call to let me know the status and if there is anything additional that he needs.

## 2019-11-26 NOTE — Progress Notes (Addendum)
ED Antimicrobial Stewardship Positive Culture Follow Up   Cory Palmer is an 59 y.o. male who presented to [facility] on 11/19/2019 with Palmer chief complaint of  Chief Complaint  Patient presents with  . Leg Pain    Recent Results (from the past 720 hour(s))  Culture, blood (routine x 2)     Status: None   Collection Time: 11/19/19  6:55 PM   Specimen: BLOOD LEFT HAND  Result Value Ref Range Status   Specimen Description   Final    BLOOD LEFT HAND Performed at [facility] [address], [place], [place] [address]    Special Requests   Final    BOTTLES DRAWN AEROBIC ONLY Blood Culture adequate volume Performed at [facility] [address] [place], [place] [address]    Culture   Final    NO GROWTH 5 DAYS Performed at [facility], [place] [address], [place], [place] [address]    Report Status 11/24/2019 FINAL  Final  Culture, blood (routine x 2)     Status: None   Collection Time: 11/19/19  6:55 PM   Specimen: BLOOD RIGHT FOREARM  Result Value Ref Range Status   Specimen Description   Final    BLOOD RIGHT FOREARM Performed at [facility] [address] [place], [place] [address]    Special Requests   Final    BOTTLES DRAWN AEROBIC AND ANAEROBIC Blood Culture results may not be optimal due to an inadequate volume of blood received in culture bottles Performed at [facility] [address], [place], [place] [address]    Culture   Final    NO GROWTH 5 DAYS Performed at [facility], [place] [address] [place], [place] [address]    Report Status 11/24/2019 FINAL  Final  Aerobic/Anaerobic Culture (surgical/deep wound)     Status: None   Collection Time: 11/19/19  9:42 PM   Specimen: Wound  Result Value Ref Range Status   Specimen Description   Final    WOUND AMP ARM LEG Performed at [facility] [address], [place], [place] [address]    Special Requests    Final    NONE Performed at [facility], [place] [address], [place], [place] [address]    Gram Stain   Final    RARE WBC PRESENT, PREDOMINANTLY PMN RARE GRAM POSITIVE COCCI    Culture   Final    MODERATE METHICILLIN RESISTANT STAPHYLOCOCCUS AUREUS NO ANAEROBES ISOLATED Performed at [facility], [place] [address] [place], [place] [address]    Report Status 11/25/2019 FINAL  Final   Organism ID, Bacteria METHICILLIN RESISTANT STAPHYLOCOCCUS AUREUS  Final      Susceptibility   Methicillin resistant staphylococcus aureus - MIC*    CIPROFLOXACIN >=8 RESISTANT Resistant     ERYTHROMYCIN >=8 RESISTANT Resistant     GENTAMICIN <=0.5 SENSITIVE Sensitive     OXACILLIN >=4 RESISTANT Resistant     TETRACYCLINE >=16 RESISTANT Resistant     VANCOMYCIN 1 SENSITIVE Sensitive     TRIMETH/SULFA <=10 SENSITIVE Sensitive     CLINDAMYCIN <=0.25 SENSITIVE Sensitive     RIFAMPIN <=0.5 SENSITIVE Sensitive     Inducible Clindamycin NEGATIVE Sensitive     * MODERATE METHICILLIN RESISTANT STAPHYLOCOCCUS AUREUS    [x]  Treated with doxycycline, organism resistant to prescribed antimicrobial   New antibiotic prescription: Bactrim DS; 2 tabs BID x 10 days (#40)  Patient has issues with transportation; per [facility] NP, arrangements were made with [facility] delivery pharmacy to deliver meds to patient.  Will need to f/u with Ortho service ASAP  ED Provider: Harvie Heck, PA-C   Cory Palmer 11/26/2019, 11:42 AM Clinical Pharmacist [phone]

## 2019-11-27 NOTE — Telephone Encounter (Signed)
Can you please call pt and make an appt for post op with [name] next Wednesday please?

## 2019-11-28 ENCOUNTER — Telehealth: Payer: Self-pay | Admitting: General Practice

## 2019-11-28 NOTE — Telephone Encounter (Signed)
   JUDD MCCUBBIN DOB: 1961/06/10 MRN: [id number]   RIDER WAIVER AND RELEASE OF LIABILITY  For purposes of improving physical access to our facilities, [facility] is pleased to partner with third parties to provide [facility] patients or other authorized individuals the option of convenient, on-demand ground transportation services (the Chiropractor") through use of the technology service that enables users to request on-demand ground transportation from independent third-party providers.  By opting to use and accept these [employer], I, the undersigned, hereby agree on behalf of myself, and on behalf of any minor child using the [employer] for whom I am the parent or legal guardian, as follows:  1. Science writer provided to me are provided by independent third-party transportation providers who are not [employer] or employees and who are unaffiliated with [employer]. 2. [facility] is neither a transportation carrier nor a common or public carrier. 3. [facility] has no control over the quality or safety of the transportation that occurs as a result of the [employer]. 4. [facility] cannot guarantee that any third-party transportation provider will complete any arranged transportation service. 5. [facility] makes no representation, warranty, or guarantee regarding the reliability, timeliness, quality, safety, suitability, or availability of any of the Transport Services or that they will be error free. 6. I fully understand that traveling by vehicle involves risks and dangers of serious bodily injury, including permanent disability, paralysis, and death. I agree, on behalf of myself and on behalf of any minor child using the Transport Services for whom I am the parent or legal guardian, that the entire risk arising out of my use of the [employer] remains solely with me, to the maximum extent permitted under applicable law. 7. The [employer] are provided "as is" and "as available." [facility] disclaims all representations and warranties, express, implied or statutory, not expressly set out in these terms, including the implied warranties of merchantability and fitness for a particular purpose. 8. I hereby waive and release [facility], its agents, employees, officers, directors, representatives, insurers, attorneys, assigns, successors, subsidiaries, and affiliates from any and all past, present, or future claims, demands, liabilities, actions, causes of action, or suits of any kind directly or indirectly arising from acceptance and use of the [employer]. 9. I further waive and release [facility] and its affiliates from all present and future liability and responsibility for any injury or death to persons or damages to property caused by or related to the use of the [employer]. 10. I have read this Waiver and Release of Liability, and I understand the terms used in it and their legal significance. This Waiver is freely and voluntarily given with the understanding that my right (as well as the right of any minor child for whom I am the parent or legal guardian using the [employer]) to legal recourse against [facility] in connection with the [employer] is knowingly surrendered in return for use of these services.   I attest that I read the consent document to [name], gave Mr. Mcisaac the opportunity to ask questions and answered the questions asked (if any). I affirm that [name] then provided consent for he's participation in this program.     Launa Grill

## 2019-12-04 ENCOUNTER — Inpatient Hospital Stay: Payer: Medicare Other | Admitting: Family

## 2019-12-04 ENCOUNTER — Telehealth: Payer: Self-pay | Admitting: Orthopedic Surgery

## 2019-12-04 NOTE — Telephone Encounter (Signed)
I called and sw pt to advise [employer] assured me that the wheelchair would be delivered today. Pt stated that this was delivered while we were on the phone and we moved his post op appt to Monday as he needs three business days to arrange for transportation will call with any other questions.

## 2019-12-04 NOTE — Telephone Encounter (Signed)
Patient called again  He is a post op patient and still has not received his wheelchair. He is concerned about the well being of his stitches but will not be able to get here without it.   Call back: [phone]

## 2019-12-06 ENCOUNTER — Telehealth: Payer: Self-pay | Admitting: Orthopedic Surgery

## 2019-12-06 ENCOUNTER — Inpatient Hospital Stay: Payer: Medicare Other | Admitting: Family

## 2019-12-06 NOTE — Telephone Encounter (Signed)
I called and sw Cory Palmer and she states that the Cory Palmer still has staples and that half the incision is open about  1 cm and that there is slough. She is going to use medi honey and alginate to try and break that up. Advised that the Cory Palmer has an appt on monday and this will be his first post op appt since 10/23/19 his surgery date. Advised to encourage the Cory Palmer to come to the appt because if we are to continue to have HH[facility] treat him then Dr. [name] needs to see him in the office.

## 2019-12-06 NOTE — Telephone Encounter (Signed)
Cory Palmer called from [facility] requesting. Wound looks bad. Cory Palmer needs wound care orders. Pease give Cory Palmer a call back at [phone].

## 2019-12-09 ENCOUNTER — Encounter: Payer: Self-pay | Admitting: Orthopedic Surgery

## 2019-12-09 ENCOUNTER — Ambulatory Visit (INDEPENDENT_AMBULATORY_CARE_PROVIDER_SITE_OTHER): Payer: Medicare Other | Admitting: Physician Assistant

## 2019-12-09 ENCOUNTER — Other Ambulatory Visit: Payer: Self-pay

## 2019-12-09 VITALS — Ht 76.0 in | Wt 140.0 lb

## 2019-12-09 DIAGNOSIS — M86672 Other chronic osteomyelitis, left ankle and foot: Secondary | ICD-10-CM

## 2019-12-09 MED ORDER — DOXYCYCLINE HYCLATE 100 MG PO TABS: 100.0000 mg | ORAL_TABLET | Freq: Two times a day (BID) | ORAL | 0 refills | Status: DC

## 2019-12-09 MED ORDER — OXYCODONE-ACETAMINOPHEN 5-325 MG PO TABS: 1.0000 | ORAL_TABLET | ORAL | 0 refills | Status: DC | PRN

## 2019-12-09 NOTE — Progress Notes (Signed)
Office Visit Note   Patient: Cory Palmer           Date of Birth: 1961/03/05           MRN: [id number] Visit Date: 12/09/2019              Requested by: No referring provider defined for this encounter. PCP: Patient, [name] Per  Chief Complaint  Patient presents with  . Left Leg - Routine Post Op    10/23/19 left BKA first post op appt      HPI: This is a pleasant 59 year old gentleman who is now 6 weeks status post left below-knee amputation.  He has been unable to come in for a postoperative visit because of transportation issues.  He was finally able to get transportation and presents today.  He does state that he has a lot of swelling and pain in the knee.  He denies any fever or chills.  He does have nursing that is been doing some daily dressing changes for him.  He is is also status post right below-knee amputation and his socket is ill fitting and is unstable   Assessment & Plan: Visit Diagnoses: No diagnosis found.  Plan: We will place him on a course of doxycycline.  He will continue with daily dressing changes but we would like for him to obtain a shrinker for the left side.  We have given him a prescription to go to [facility] to obtain these hopefully as soon as possible.  On the right side he will require a new socket and supplies at is ill fitting and loose and does not provide any stability he will follow-up in 1 week Patient is an existing right transtibial  amputee.  Patient's current comorbidities are not expected to impact the ability to function with the prescribed prosthesis. Patient verbally communicates a strong desire to use a prosthesis. Patient currently requires mobility aids to ambulate without a prosthesis.  Expects not to use mobility aids with a new prosthesis.  Patient is a K3 level ambulator that spends a lot of time walking around on uneven terrain over obstacles, up and down stairs, and ambulates with a variable cadence.   Follow-Up Instructions:  No follow-ups on file.   Ortho Exam  Patient is alert, oriented, no adenopathy, well-dressed, normal affect, normal respiratory effort. Focused examination of his left below-knee amputation stump demonstrates some wound dehiscence and some surrounding erythema.  There is no particular foul odor.  He does have some serous drainage.  There are areas where the staples have pulled out.  About half of the staples were removed today.  Right below-knee amputation well-healed surgical incision.  Current socket has maximum number of ply socks and is still quite loose and unstable  Imaging: No results found. No images are attached to the encounter.  Labs: Lab Results  Component Value Date   HGBA1C 6.8 (H) 10/19/2019   HGBA1C 6.6 (H) 09/28/2018   HGBA1C 7.5 (H) 05/08/2018   ESRSEDRATE 34 (H) 08/15/2019   ESRSEDRATE 31 (H) 08/05/2019   ESRSEDRATE 33 (H) 10/28/2018   CRP 15.0 (H) 08/15/2019   CRP 11.0 (H) 08/05/2019   CRP <0.8 10/28/2018   REPTSTATUS 11/25/2019 FINAL 11/19/2019   GRAMSTAIN  11/19/2019    RARE WBC PRESENT, PREDOMINANTLY PMN RARE GRAM POSITIVE COCCI    CULT  11/19/2019    MODERATE METHICILLIN RESISTANT STAPHYLOCOCCUS AUREUS NO ANAEROBES ISOLATED Performed at [facility], [address] [place], [place] [address]  LABORGA METHICILLIN RESISTANT STAPHYLOCOCCUS AUREUS 11/19/2019     Lab Results  Component Value Date   ALBUMIN 3.4 (L) 11/19/2019   ALBUMIN 2.7 (L) 11/04/2019   ALBUMIN 2.4 (L) 10/31/2019   PREALBUMIN 11.2 (L) 05/08/2018    Lab Results  Component Value Date   MG 1.5 (L) 11/04/2019   MG 1.5 (L) 10/30/2019   MG 1.7 10/29/2019   No results found for: VD25OH  Lab Results  Component Value Date   PREALBUMIN 11.2 (L) 05/08/2018   CBC EXTENDED Latest Ref Rng & Units 11/19/2019 11/06/2019 11/04/2019  WBC 4.0 - 10.5 K/uL 9.5 4.7 6.1  RBC 4.22 - 5.81 MIL/uL 3.62(L) 3.41(L) 3.52(L)  HGB 13.0 - 17.0 g/dL 11.2(L) 10.5(L) 10.7(L)  HCT 39.0 - 52.0 %  34.8(L) 32.3(L) 33.5(L)  PLT 150 - 400 K/uL 145(L) 123(L) 139(L)  NEUTROABS 1.7 - 7.7 K/uL 6.6 - -  LYMPHSABS 0.7 - 4.0 K/uL 1.6 - -     Body mass index is 17.04 kg/m.  Orders:  No orders of the defined types were placed in this encounter.  Meds ordered this encounter  Medications  . doxycycline (VIBRA-TABS) 100 MG tablet    Sig: Take 1 tablet (100 mg total) by mouth 2 (two) times daily.    Dispense:  60 tablet    Refill:  0  . oxyCODONE-acetaminophen (PERCOCET/ROXICET) 5-325 MG tablet    Sig: Take 1 tablet by mouth every 4 (four) hours as needed for severe pain.    Dispense:  30 tablet    Refill:  0     Procedures: No procedures performed  Clinical Data: No additional findings.  ROS:  All other systems negative, except as noted in the HPI. Review of Systems  Objective: Vital Signs: Ht 6\' 4"  (1.93 m)   Wt 140 lb (63.5 kg)   BMI 17.04 kg/m   Specialty Comments:  No specialty comments available.  PMFS History: Patient Active Problem List   Diagnosis Date Noted  . Hyponatremia 10/23/2019  . MRSA bacteremia 10/21/2019  . Atrial flutter with rapid ventricular response ([place]) 10/21/2019  . Lactic acidosis 10/21/2019  . Severe protein-calorie malnutrition ([place])   . Diabetic polyneuropathy associated with type 2 diabetes mellitus ([place])   . Osteomyelitis of left foot ([place]) 10/19/2019  . Cirrhosis of liver ([place]) 10/19/2019  . Anemia 10/19/2019  . Medication monitoring encounter 08/15/2019  . Serum total bilirubin elevated 10/29/2018  . BPH (benign prostatic hyperplasia) 10/29/2018  . Non-pressure chronic ulcer of other part of left foot limited to breakdown of skin ([place])   . Below-knee amputation of right lower extremity ([place])   . Hx of BKA, right ([place])   . PAF (paroxysmal atrial fibrillation) ([place])   . Hypokalemia   . Hypomagnesemia   . Sepsis without acute organ dysfunction ([place]) 10/05/2018  . Depressed bipolar disorder ([place]) 10/05/2018  . Hypertension  10/05/2018  . Hyperbilirubinemia 10/05/2018  . S/P transmetatarsal amputation of foot, left ([place]) 09/28/2018  . Wound infection 05/07/2018  . Homelessness 05/07/2018  . Diabetes mellitus type II, non insulin dependent ([place]) 05/07/2018   Past Medical History:  Diagnosis Date  . Anxiety   . Depressed bipolar disorder ([place])   . Diabetes mellitus without complication ([place])   . Hypertension   . Osteomyelitis of toe of left foot (HCC)     Family History  Problem Relation Age of Onset  . Hypertension Other   . Diabetes Mellitus II Other     Past Surgical History:  Procedure  Laterality Date  . AMPUTATION Left 09/28/2018   Procedure: LEFT TRANSMETATARSAL AMPUTATION;  Surgeon: Nadara Mustard, MD;  Location: [facility] OR;  Service: Orthopedics;  Laterality: Left;  . AMPUTATION Left 10/23/2019   Procedure: AMPUTATION BELOW KNEE;  Surgeon: Nadara Mustard, MD;  Location[facility];  Service: Orthopedics;  Laterality: Left;  . BELOW KNEE LEG AMPUTATION Right   . TEE WITHOUT CARDIOVERSION N/A 10/25/2019   Procedure: TRANSESOPHAGEAL ECHOCARDIOGRAM (TEE);  Surgeon: Lewayne Bunting, MD;  Location: [facility] ENDOSCOPY;  Service: Cardiovascular;  Laterality: N/A;   Social History   Occupational History  . Not on file  Tobacco Use  . Smoking status: Current Every Day Smoker    Types: Cigarettes  . Smokeless tobacco: Never Used  Substance and Sexual Activity  . Alcohol use: Not Currently    Comment: occasional  . Drug use: Not Currently    Types: Cocaine  . Sexual activity: Not on file

## 2019-12-16 ENCOUNTER — Ambulatory Visit: Payer: Medicare Other | Admitting: Physician Assistant

## 2019-12-20 ENCOUNTER — Encounter: Payer: Self-pay | Admitting: Physician Assistant

## 2019-12-20 ENCOUNTER — Ambulatory Visit (INDEPENDENT_AMBULATORY_CARE_PROVIDER_SITE_OTHER): Payer: Medicare Other | Admitting: Physician Assistant

## 2019-12-20 ENCOUNTER — Other Ambulatory Visit: Payer: Self-pay

## 2019-12-20 VITALS — Ht 76.0 in | Wt 140.0 lb

## 2019-12-20 DIAGNOSIS — Z89511 Acquired absence of right leg below knee: Secondary | ICD-10-CM

## 2019-12-20 NOTE — Progress Notes (Signed)
Office Visit Note   Patient: Cory Palmer           Date of Birth: 04-10-1961           MRN: [id number] Visit Date: 12/20/2019              Requested by: No referring provider defined for this encounter. PCP: Patient, [name] Per  Chief Complaint  Patient presents with  . Left Leg - Routine Post Op    10/23/19 left BKA       HPI: This is a pleasant gentleman who is now 7 weeks status post left below-knee amputation.  He did not follow-up for his first postop until he was 6 weeks from surgery.  He has been getting some nursing care to change the dressings but for the most part states he is doing it himself.  He did have a dressing change with Medihoney and Covan.  He has lost his shrinker and when I asked him if he had gotten his prescription for a new 1 and he said he had not been able to get one yet.  Assessment & Plan: Visit Diagnoses: No diagnosis found.  Plan: I would like for him to do dry dressing changes only.  Continue to wash with mild soap and water continue on antibiotics.  Follow-up in 1 week.  Follow-Up Instructions: No follow-ups on file.   Ortho Exam  Patient is alert, oriented, no adenopathy, well-dressed, normal affect, normal respiratory effort. Below-knee amputation stump has one area medially of superficial dehiscence.  There is fibrinous tissue that does not probe deeply with some debridement it does bleed.  Rest of surgical suture staples were removed today.  He still has serous drainage no foul odor no surrounding cellulitis.  Emphasized the importance of elevating his amputation stump.  We will follow up in a week.  Imaging: No results found. No images are attached to the encounter.  Labs: Lab Results  Component Value Date   HGBA1C 6.8 (H) 10/19/2019   HGBA1C 6.6 (H) 09/28/2018   HGBA1C 7.5 (H) 05/08/2018   ESRSEDRATE 34 (H) 08/15/2019   ESRSEDRATE 31 (H) 08/05/2019   ESRSEDRATE 33 (H) 10/28/2018   CRP 15.0 (H) 08/15/2019   CRP 11.0 (H)  08/05/2019   CRP <0.8 10/28/2018   REPTSTATUS 11/25/2019 FINAL 11/19/2019   GRAMSTAIN  11/19/2019    RARE WBC PRESENT, PREDOMINANTLY PMN RARE GRAM POSITIVE COCCI    CULT  11/19/2019    MODERATE METHICILLIN RESISTANT STAPHYLOCOCCUS AUREUS NO ANAEROBES ISOLATED Performed at [facility], [address], [place], [place] [address]    LABORGA METHICILLIN RESISTANT STAPHYLOCOCCUS AUREUS 11/19/2019     Lab Results  Component Value Date   ALBUMIN 3.4 (L) 11/19/2019   ALBUMIN 2.7 (L) 11/04/2019   ALBUMIN 2.4 (L) 10/31/2019   PREALBUMIN 11.2 (L) 05/08/2018    Lab Results  Component Value Date   MG 1.5 (L) 11/04/2019   MG 1.5 (L) 10/30/2019   MG 1.7 10/29/2019   No results found for: VD25OH  Lab Results  Component Value Date   PREALBUMIN 11.2 (L) 05/08/2018   CBC EXTENDED Latest Ref Rng & Units 11/19/2019 11/06/2019 11/04/2019  WBC 4.0 - 10.5 K/uL 9.5 4.7 6.1  RBC 4.22 - 5.81 MIL/uL 3.62(L) 3.41(L) 3.52(L)  HGB 13.0 - 17.0 g/dL 11.2(L) 10.5(L) 10.7(L)  HCT 39.0 - 52.0 % 34.8(L) 32.3(L) 33.5(L)  PLT 150 - 400 K/uL 145(L) 123(L) 139(L)  NEUTROABS 1.7 - 7.7 K/uL 6.6 - -  LYMPHSABS 0.7 - 4.0 K/uL 1.6 - -     Body mass index is 17.04 kg/m.  Orders:  No orders of the defined types were placed in this encounter.  No orders of the defined types were placed in this encounter.    Procedures: No procedures performed  Clinical Data: No additional findings.  ROS:  All other systems negative, except as noted in the HPI. Review of Systems  Objective: Vital Signs: Ht 6\' 4"  (1.93 m)   Wt 140 lb (63.5 kg)   BMI 17.04 kg/m   Specialty Comments:  No specialty comments available.  PMFS History: Patient Active Problem List   Diagnosis Date Noted  . Hyponatremia 10/23/2019  . MRSA bacteremia 10/21/2019  . Atrial flutter with rapid ventricular response ([place]) 10/21/2019  . Lactic acidosis 10/21/2019  . Severe protein-calorie malnutrition ([place])   . Diabetic  polyneuropathy associated with type 2 diabetes mellitus ([place])   . Osteomyelitis of left foot ([place]) 10/19/2019  . Cirrhosis of liver ([place]) 10/19/2019  . Anemia 10/19/2019  . Medication monitoring encounter 08/15/2019  . Serum total bilirubin elevated 10/29/2018  . BPH (benign prostatic hyperplasia) 10/29/2018  . Non-pressure chronic ulcer of other part of left foot limited to breakdown of skin ([place])   . Below-knee amputation of right lower extremity ([place])   . Hx of BKA, right ([place])   . PAF (paroxysmal atrial fibrillation) ([place])   . Hypokalemia   . Hypomagnesemia   . Sepsis without acute organ dysfunction ([place]) 10/05/2018  . Depressed bipolar disorder ([place]) 10/05/2018  . Hypertension 10/05/2018  . Hyperbilirubinemia 10/05/2018  . S/P transmetatarsal amputation of foot, left ([place]) 09/28/2018  . Wound infection 05/07/2018  . Homelessness 05/07/2018  . Diabetes mellitus type II, non insulin dependent ([place]) 05/07/2018   Past Medical History:  Diagnosis Date  . Anxiety   . Depressed bipolar disorder ([place])   . Diabetes mellitus without complication ([place])   . Hypertension   . Osteomyelitis of toe of left foot (HCC)     Family History  Problem Relation Age of Onset  . Hypertension Other   . Diabetes Mellitus II Other     Past Surgical History:  Procedure Laterality Date  . AMPUTATION Left 09/28/2018   Procedure: LEFT TRANSMETATARSAL AMPUTATION;  Surgeon: Newt Minion, MD;  Location: [facility];  Service: Orthopedics;  Laterality: Left;  . AMPUTATION Left 10/23/2019   Procedure: AMPUTATION BELOW KNEE;  Surgeon: Newt Minion, MD;  Location: [facility];  Service: Orthopedics;  Laterality: Left;  . BELOW KNEE LEG AMPUTATION Right   . TEE WITHOUT CARDIOVERSION N/A 10/25/2019   Procedure: TRANSESOPHAGEAL ECHOCARDIOGRAM (TEE);  Surgeon: Lelon Perla, MD;  [facility] END[facility]COPY;  Service: Cardiovascular;  Laterality: N/A;   Social History   Occupational History  . Not on file  Tobacco Use   . Smoking status: Current Every Day Smoker    Types: Cigarettes  . Smokeless tobacco: Never Used  Substance and Sexual Activity  . Alcohol use: Not Currently    Comment: occasional  . Drug use: Not Currently    Types: Cocaine  . Sexual activity: Not on file

## 2019-12-23 ENCOUNTER — Telehealth: Payer: Self-pay | Admitting: Orthopedic Surgery

## 2019-12-23 NOTE — Telephone Encounter (Signed)
Tracey with [facility] called requesting Wound Care Orders.  CB#[phone].  Thank you.

## 2019-12-24 NOTE — Telephone Encounter (Signed)
I called and advised that at last visit pt was advised to wash with soap and water and apply a dry dressing continue with ABX. He has an office visit this Friday I advised HH[facility] that I will hold this message and call back if there are any changes.

## 2019-12-26 ENCOUNTER — Other Ambulatory Visit: Payer: Self-pay | Admitting: Orthopedic Surgery

## 2019-12-26 ENCOUNTER — Telehealth: Payer: Self-pay | Admitting: Orthopedic Surgery

## 2019-12-26 MED ORDER — OXYCODONE-ACETAMINOPHEN 5-325 MG PO TABS: 1.0000 | ORAL_TABLET | ORAL | 0 refills | Status: DC | PRN

## 2019-12-26 NOTE — Telephone Encounter (Signed)
Pt is s/p left BKA 10/23/19 and is requesting refill on oxycodone last refill was 12/09/19 #30

## 2019-12-26 NOTE — Telephone Encounter (Signed)
Rx sent 

## 2019-12-26 NOTE — Telephone Encounter (Signed)
Patient called.   He is requesting a refill on his Oxycodone. He is also wanting a call back to see what's going on with the order for a power wheelchair  Call back: [phone]

## 2019-12-26 NOTE — Telephone Encounter (Signed)
I called and sw pt to advise that the rx was sent to the pharmacy and that if he is interested in a power chair that he can contact a mobility store and have them talk him through the process and do the home assessment. Insurance will most likely not cover this request if he is trying to also obtain a prosthetic. Pt voiced understanding and I advised that we will fill out paperwork for whatever he needs.

## 2019-12-27 ENCOUNTER — Ambulatory Visit (INDEPENDENT_AMBULATORY_CARE_PROVIDER_SITE_OTHER): Payer: Medicare Other | Admitting: Physician Assistant

## 2019-12-27 ENCOUNTER — Encounter: Payer: Self-pay | Admitting: Physician Assistant

## 2019-12-27 ENCOUNTER — Other Ambulatory Visit: Payer: Self-pay

## 2019-12-27 VITALS — Ht 76.0 in | Wt 140.0 lb

## 2019-12-27 DIAGNOSIS — M79672 Pain in left foot: Secondary | ICD-10-CM

## 2019-12-27 NOTE — Progress Notes (Signed)
Office Visit Note   Patient: Cory Palmer           Date of Birth: 1960-11-16           MRN: [id number] Visit Date: 12/27/2019              Requested by: No referring provider defined for this encounter. PCP: Patient, [name] Per  Chief Complaint  Patient presents with  . Left Leg - Routine Post Op    10/23/19 left BKA       HPI: Patient presents today 2 months status post left below-knee amputation.  He was initially lost to follow-up because he was unable to get to the office.  He is actually doing fairly well.  He has been doing daily dressing changes.  He is due to be fit for a new prosthetic on his right leg  Assessment & Plan: Visit Diagnoses: No diagnosis found.  Plan: Patient will continue to do dry dressing changes.  We will continue to cleanse this with mild soap and water.  I would like for him to work on elevating his stump more to get some of the swelling out.  When he goes to [employer] for his new leg he is also going to get some shrinkers for the left leg Follow-Up Instructions: No follow-ups on file.   Ortho Exam  Patient is alert, oriented, no adenopathy, well-dressed, normal affect, normal respiratory effort. Focused examination of his left below-knee amputation stump demonstrates healed surgical incision for the most part.  He has 2 areas of superficial wound dehiscence with fibrinous tissue.  There is no foul odor or surrounding cellulitis.  Minimal clear drainage in these only probes down to about a millimeter  Imaging: No results found. No images are attached to the encounter.  Labs: Lab Results  Component Value Date   HGBA1C 6.8 (H) 10/19/2019   HGBA1C 6.6 (H) 09/28/2018   HGBA1C 7.5 (H) 05/08/2018   ESRSEDRATE 34 (H) 08/15/2019   ESRSEDRATE 31 (H) 08/05/2019   ESRSEDRATE 33 (H) 10/28/2018   CRP 15.0 (H) 08/15/2019   CRP 11.0 (H) 08/05/2019   CRP <0.8 10/28/2018   REPTSTATUS 11/25/2019 FINAL 11/19/2019   GRAMSTAIN  11/19/2019    RARE WBC  PRESENT, PREDOMINANTLY PMN RARE GRAM POSITIVE COCCI    CULT  11/19/2019    MODERATE METHICILLIN RESISTANT STAPHYLOCOCCUS AUREUS NO ANAEROBES ISOLATED Performed at [facility], [place] [address] [place], [place] [address]    LABORGA METHICILLIN RESISTANT STAPHYLOCOCCUS AUREUS 11/19/2019     Lab Results  Component Value Date   ALBUMIN 3.4 (L) 11/19/2019   ALBUMIN 2.7 (L) 11/04/2019   ALBUMIN 2.4 (L) 10/31/2019   PREALBUMIN 11.2 (L) 05/08/2018    Lab Results  Component Value Date   MG 1.5 (L) 11/04/2019   MG 1.5 (L) 10/30/2019   MG 1.7 10/29/2019   No results found for: VD25OH  Lab Results  Component Value Date   PREALBUMIN 11.2 (L) 05/08/2018   CBC EXTENDED Latest Ref Rng & Units 11/19/2019 11/06/2019 11/04/2019  WBC 4.0 - 10.5 K/uL 9.5 4.7 6.1  RBC 4.22 - 5.81 MIL/uL 3.62(L) 3.41(L) 3.52(L)  HGB 13.0 - 17.0 g/dL 11.2(L) 10.5(L) 10.7(L)  HCT 39.0 - 52.0 % 34.8(L) 32.3(L) 33.5(L)  PLT 150 - 400 K/uL 145(L) 123(L) 139(L)  NEUTROABS 1.7 - 7.7 K/uL 6.6 - -  LYMPHSABS 0.7 - 4.0 K/uL 1.6 - -     Body mass index is 17.04 kg/m.  Orders:  No orders of the defined types were placed in this encounter.  No orders of the defined types were placed in this encounter.    Procedures: No procedures performed  Clinical Data: No additional findings.  ROS:  All other systems negative, except as noted in the HPI. Review of Systems  Objective: Vital Signs: Ht 6\' 4"  (1.93 m)   Wt 140 lb (63.5 kg)   BMI 17.04 kg/m   Specialty Comments:  No specialty comments available.  PMFS History: Patient Active Problem List   Diagnosis Date Noted  . Hyponatremia 10/23/2019  . MRSA bacteremia 10/21/2019  . Atrial flutter with rapid ventricular response (HCC) 10/21/2019  . Lactic acidosis 10/21/2019  . Severe protein-calorie malnutrition (HCC)   . Diabetic polyneuropathy associated with type 2 diabetes mellitus (HCC)   . Osteomyelitis of left foot (HCC) 10/19/2019  . Cirrhosis  of liver (HCC) 10/19/2019  . Anemia 10/19/2019  . Medication monitoring encounter 08/15/2019  . Serum total bilirubin elevated 10/29/2018  . BPH (benign prostatic hyperplasia) 10/29/2018  . Non-pressure chronic ulcer of other part of left foot limited to breakdown of skin (HCC)   . Below-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)   . Hx of BKA, right (HCC)   . PAF (paroxysmal atrial fibrillation) (HCC)   . Hypokalemia   . Hypomagnesemia   . Sepsis without acute organ dysfunction (HCC) 10/05/2018  . Depressed bipolar disorder (HCC) 10/05/2018  . Hypertension 10/05/2018  . Hyperbilirubinemia 10/05/2018  . S/P transmetatarsal amputation of foot, 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  . Wound infection 05/07/2018  . Homelessness 05/07/2018  . Diabetes mellitus type II, non insulin dependent (HCC) 05/07/2018   Past Medical History:  Diagnosis Date  . Anxiety   . Depressed bipolar disorder (HCC)   . Diabetes mellitus without complication (HCC)   . Hypertension   . Osteomyelitis of toe of left foot (HCC)     Family History  Problem Relation Age of Onset  . Hypertension Other   . Diabetes Mellitus II Other     Past Surgical History:  Procedure Laterality Date  . AMPUTATION Left 09/28/2018   Procedure: LEFT TRANSMETATARSAL AMPUTATION;  Surgeon: 09/30/2018, MD;  Location: [facility] OR;  Service: Orthopedics;  Laterality: Left;  . AMPUTATION Left 10/23/2019   Procedure: AMPUTATION BELOW KNEE;  Surgeon: 10/25/2019, MD;  Location: [facility] OR;  Service: Orthopedics;  Laterality: Left;  . BELOW KNEE LEG AMPUTATION Right   . TEE WITHOUT CARDIOVERSION N/A 10/25/2019   Procedure: TRANSESOPHAGEAL ECHOCARDIOGRAM (TEE);  Surgeon: 10/27/2019, MD;  Location: [facility] ENDOSCOPY;  Service: Cardiovascular;  Laterality: N/A;   Social History   Occupational History  . Not on file  Tobacco Use  . Smoking status: Current Every Day Smoker    Types: Cigarettes  . Smokeless tobacco: Never Used  Substance and Sexual  Activity  . Alcohol use: Not Currently    Comment: occasional  . Drug use: Not Currently    Types: Cocaine  . Sexual activity: Not on file

## 2019-12-27 NOTE — Telephone Encounter (Signed)
No changes

## 2020-01-06 ENCOUNTER — Telehealth: Payer: Self-pay | Admitting: Orthopedic Surgery

## 2020-01-06 NOTE — Telephone Encounter (Signed)
Noted  

## 2020-01-06 NOTE — Telephone Encounter (Signed)
Patient called wanting to give heads up about Dr. [name] writing a prescription for electrical chair when patient comes for his appointment. Patient will provide all information when he is seen on his appointment date. Patient phone number is  [phone].

## 2020-01-10 ENCOUNTER — Other Ambulatory Visit: Payer: Self-pay

## 2020-01-10 ENCOUNTER — Encounter: Payer: Self-pay | Admitting: Physician Assistant

## 2020-01-10 ENCOUNTER — Ambulatory Visit (INDEPENDENT_AMBULATORY_CARE_PROVIDER_SITE_OTHER): Payer: Medicare Other | Admitting: Physician Assistant

## 2020-01-10 VITALS — Ht 76.0 in | Wt 140.0 lb

## 2020-01-10 DIAGNOSIS — Z89511 Acquired absence of right leg below knee: Secondary | ICD-10-CM

## 2020-01-10 NOTE — Progress Notes (Addendum)
Office Visit Note   Patient: Cory Palmer           Date of Birth: [birth year]           MRN: [id number] Visit Date: 01/10/2020              Requested by: No referring provider defined for this encounter. PCP: Patient, [name] Per  Chief Complaint  Patient presents with  . Left Leg - Routine Post Op    10/23/19 left BKA       HPI: This is a pleasant 59 year old gentleman who is 10 weeks status post left below-knee amputation.  He is going to have a visit with [employer] next week.  Assessment & Plan: Visit Diagnoses: No diagnosis found.  Plan: I have given the patient a prescription for a smaller size shrinker as well as his socket.  We discussed that he needs to call to get an evaluation of his home for his power chair.  He has a difficult time self-propelling with a manual chair.  He does have a history of paroxysmal atrial fibrillation.  Also history of diabetes and bilateral lower extremity amputations.    Follow-Up Instructions: No follow-ups on file.   Ortho Exam  Patient is alert, oriented, no adenopathy, well-dressed, normal affect, normal respiratory effort. Examination of his left below-knee amputation stump he still has moderate swelling but no erythema or cellulitis.  Incision overall is well-healed with just remaining eschar.  There is a very superficial abrasion on the medial side without surrounding cellulitis or necrosis no foul odor Patient is a new left transtibial  amputee.  Patient's current comorbidities are not expected to impact the ability to function with the prescribed prosthesis. Patient verbally communicates a strong desire to use a prosthesis. Patient currently requires mobility aids to ambulate without a prosthesis.  Expects not to use mobility aids with a new prosthesis.  Patient is a K2 level ambulator that will use a prosthesis to walk around their home and the community over low level environmental barriers.     Imaging: No results found. No  images are attached to the encounter.  Labs: Lab Results  Component Value Date   HGBA1C 6.8 (H) 10/19/2019   HGBA1C 6.6 (H) 09/28/2018   HGBA1C 7.5 (H) 05/08/2018   ESRSEDRATE 34 (H) 08/15/2019   ESRSEDRATE 31 (H) 08/05/2019   ESRSEDRATE 33 (H) 10/28/2018   CRP 15.0 (H) 08/15/2019   CRP 11.0 (H) 08/05/2019   CRP <0.8 10/28/2018   REPTSTATUS 11/25/2019 FINAL 11/19/2019   GRAMSTAIN  11/19/2019    RARE WBC PRESENT, PREDOMINANTLY PMN RARE GRAM POSITIVE COCCI    CULT  11/19/2019    MODERATE METHICILLIN RESISTANT STAPHYLOCOCCUS AUREUS NO ANAEROBES ISOLATED Performed at [facility], [address], [place], [place] [address]    LABORGA METHICILLIN RESISTANT STAPHYLOCOCCUS AUREUS 11/19/2019     Lab Results  Component Value Date   ALBUMIN 3.4 (L) 11/19/2019   ALBUMIN 2.7 (L) 11/04/2019   ALBUMIN 2.4 (L) 10/31/2019   PREALBUMIN 11.2 (L) 05/08/2018    Lab Results  Component Value Date   MG 1.5 (L) 11/04/2019   MG 1.5 (L) 10/30/2019   MG 1.7 10/29/2019   No results found for: VD25OH  Lab Results  Component Value Date   PREALBUMIN 11.2 (L) 05/08/2018   CBC EXTENDED Latest Ref Rng & Units 11/19/2019 11/06/2019 11/04/2019  WBC 4.0 - 10.5 K/uL 9.5 4.7 6.1  RBC 4.22 - 5.81 MIL/uL 3.62(L) 3.41(L) 3.52(L)  HGB 13.0 - 17.0 g/dL 11.2(L) 10.5(L) 10.7(L)  HCT 39 - 52 % 34.8(L) 32.3(L) 33.5(L)  PLT 150 - 400 K/uL 145(L) 123(L) 139(L)  NEUTROABS 1.7 - 7.7 K/uL 6.6 - -  LYMPHSABS 0.7 - 4.0 K/uL 1.6 - -     Body mass index is 17.04 kg/m.  Orders:  No orders of the defined types were placed in this encounter.  No orders of the defined types were placed in this encounter.    Procedures: No procedures performed  Clinical Data: No additional findings.  ROS:  All other systems negative, except as noted in the HPI. Review of Systems  Objective: Vital Signs: Ht 6\' 4"  (1.93 m)   Wt 140 lb (63.5 kg)   BMI 17.04 kg/m   Specialty Comments:  No specialty comments  available.  PMFS History: Patient Active Problem List   Diagnosis Date Noted  . Hyponatremia 10/23/2019  . MRSA bacteremia 10/21/2019  . Atrial flutter with rapid ventricular response ([place]) 10/21/2019  . Lactic acidosis 10/21/2019  . Severe protein-calorie malnutrition ([place])   . Diabetic polyneuropathy associated with type 2 diabetes mellitus ([place])   . Osteomyelitis of left foot ([place]) 10/19/2019  . Cirrhosis of liver ([place]) 10/19/2019  . Anemia 10/19/2019  . Medication monitoring encounter 08/15/2019  . Serum total bilirubin elevated 10/29/2018  . BPH (benign prostatic hyperplasia) 10/29/2018  . Non-pressure chronic ulcer of other part of left foot limited to breakdown of skin ([place])   . Below-knee amputation of right lower extremity ([place])   . Hx of BKA, right ([place])   . PAF (paroxysmal atrial fibrillation) ([place])   . Hypokalemia   . Hypomagnesemia   . Sepsis without acute organ dysfunction ([place]) 10/05/2018  . Depressed bipolar disorder ([place]) 10/05/2018  . Hypertension 10/05/2018  . Hyperbilirubinemia 10/05/2018  . S/P transmetatarsal amputation of foot, left ([place]) 09/28/2018  . Wound infection 05/07/2018  . Homelessness 05/07/2018  . Diabetes mellitus type II, non insulin dependent ([place]) 05/07/2018   Past Medical History:  Diagnosis Date  . Anxiety   . Depressed bipolar disorder ([place])   . Diabetes mellitus without complication ([place])   . Hypertension   . Osteomyelitis of toe of left foot (HCC)     Family History  Problem Relation Age of Onset  . Hypertension Other   . Diabetes Mellitus II Other     Past Surgical History:  Procedure Laterality Date  . AMPUTATION Left 09/28/2018   Procedure: LEFT TRANSMETATARSAL AMPUTATION;  Surgeon: Newt Minion, MD;  Location: [facility];  Service: Orthopedics;  Laterality: Left;  . AMPUTATION Left 10/23/2019   Procedure: AMPUTATION BELOW KNEE;  Surgeon: Newt Minion, MD;  Location: [facility];  Service: Orthopedics;  Laterality: Left;  . BELOW  KNEE LEG AMPUTATION Right   . TEE WITHOUT CARDIOVERSION N/A 10/25/2019   Procedure: TRANSESOPHAGEAL ECHOCARDIOGRAM (TEE);  Surgeon: Lelon Perla, MD;  Location: [facility] ENDOSCOPY;  Service: Cardiovascular;  Laterality: N/A;   Social History   Occupational History  . Not on file  Tobacco Use  . Smoking status: Current Every Day Smoker    Types: Cigarettes  . Smokeless tobacco: Never Used  Vaping Use  . Vaping Use: Never used  Substance and Sexual Activity  . Alcohol use: Not Currently    Comment: occasional  . Drug use: Not Currently    Types: Cocaine  . Sexual activity: Not on file

## 2020-01-13 ENCOUNTER — Telehealth: Payer: Self-pay | Admitting: Orthopedic Surgery

## 2020-01-13 NOTE — Telephone Encounter (Signed)
Pt states he's been trying to get a motorized scooter but has been getting the run around; the pt would like to know what paperwork he needs to have the scooter company send over to [place]?  [phone]

## 2020-01-13 NOTE — Telephone Encounter (Signed)
Will you make an addendum to the pt's last office visit note to count as a face to face eval for a power chair. Aside from the Bilateral BKA is there a cardiac, lung, kidney issue or shoulder spinal issue that would cause the pt not to be able to self propel in a manuel wheelchair. If so can you make the notation to your office visit and then let me know when this has been done. I called [employer] and asked how I can get the ball rolling and she said I could fax this note and a demographic sheet to them and they will try to contact the pt. They just need to know tht there is a provider that will sign for the order once the referral has been started. Will fax to [phone] [employer]

## 2020-01-14 NOTE — Telephone Encounter (Signed)
Faxed last OV and demo sheet to [employer] [phone] tried to call pt to advise and no answer and no vm available. Will hold message and try and reach later today. I have had multiple conversations with this pt and advised that he would need to contact the mobility office and that they would make arrangements to meet with the pt, make an appointment to come out and assess his home to see what type of device would benefit his needs and his living space. The pt refuses to call so I called the office yesterday and the company advised if I would send the last office visit note and a demo sheet that they will reach out to the patient.

## 2020-01-14 NOTE — Telephone Encounter (Signed)
I addended the note however He really doesn't have much going on that would justify chair. I did note his afib and bilateral amputations but not sure that is going to be enough

## 2020-01-20 ENCOUNTER — Telehealth: Payer: Self-pay | Admitting: Orthopedic Surgery

## 2020-01-20 NOTE — Telephone Encounter (Signed)
Patient called requesting a call back about Dr. [name] writing a prescription for a motorized wheelchair. Please call patient concerning this matter. Patient states insurance company will approve if Dr. [name] writes a script for chair. Patient phone number is [phone]

## 2020-01-20 NOTE — Telephone Encounter (Signed)
I called pt and gave number for [employer] advised that per his request the information was sent to them to start the process of obtaining a mobility aid. If they have no been in contact with him then he should call today to set up an appt for evaluation. Pt states that he will call (number provided) and let me know if he has any questions.

## 2020-01-24 NOTE — Telephone Encounter (Signed)
Patient called stating that [employer] has not received the faxed prescription for his motorized wheelchair.  Patient stated that he talked to them this morning.  CB#[phone].  Thank you.

## 2020-01-24 NOTE — Telephone Encounter (Signed)
Holding for you.  

## 2020-01-27 ENCOUNTER — Telehealth: Payer: Self-pay

## 2020-01-27 NOTE — Telephone Encounter (Signed)
Patient has called several times concerning an electric wheelchair, please refer to other notes. Thank you.

## 2020-01-27 NOTE — Telephone Encounter (Signed)
Patient called in  Asking about getting electric chair , says his recent chair has been broken and he is injured.

## 2020-01-28 NOTE — Telephone Encounter (Signed)
I called [employer] again and verified the fax number and was also given a second fax number to send information faxed demo sheet, office note and rx that says power mobility to [phone] and [phone] sw Tina at [employer] and will call if any other concerns.

## 2020-01-31 ENCOUNTER — Encounter: Payer: Self-pay | Admitting: Physician Assistant

## 2020-01-31 ENCOUNTER — Other Ambulatory Visit: Payer: Self-pay

## 2020-01-31 ENCOUNTER — Ambulatory Visit (INDEPENDENT_AMBULATORY_CARE_PROVIDER_SITE_OTHER): Payer: Medicare Other | Admitting: Physician Assistant

## 2020-01-31 VITALS — Ht 76.0 in | Wt 140.0 lb

## 2020-01-31 DIAGNOSIS — Z89511 Acquired absence of right leg below knee: Secondary | ICD-10-CM

## 2020-01-31 NOTE — Progress Notes (Signed)
Office Visit Note   Patient: Cory Palmer           Date of Birth: 27-Dec-1960           MRN: [id number] Visit Date: 01/31/2020              Requested by: No referring provider defined for this encounter. PCP: Patient, [name] Per  Chief Complaint  Patient presents with  . Left Leg - Routine Post Op    10/23/19 left BKA       HPI: This is a pleasant gentleman who is 3 months status post left below-knee amputation.  He did obtain a smaller shrinker.  He is opting is a bilateral amputee to get a power wheelchair.  All paperwork has been submitted for evaluation  Assessment & Plan: Visit Diagnoses: No diagnosis found.  Plan: The patient will follow up in 3 months.  If he has any concerns he may see [place] sooner continue to wear the shrinker on the left side  Follow-Up Instructions: No follow-ups on file.   Ortho Exam  Patient is alert, oriented, no adenopathy, well-dressed, normal affect, normal respiratory effort. Left below-knee amputation stump shrinker is in place well-healed surgical incision.  Mild amount of soft tissue swelling no cellulitis good range of motion of his left knee no drainage  Imaging: No results found. No images are attached to the encounter.  Labs: Lab Results  Component Value Date   HGBA1C 6.8 (H) 10/19/2019   HGBA1C 6.6 (H) 09/28/2018   HGBA1C 7.5 (H) 05/08/2018   ESRSEDRATE 34 (H) 08/15/2019   ESRSEDRATE 31 (H) 08/05/2019   ESRSEDRATE 33 (H) 10/28/2018   CRP 15.0 (H) 08/15/2019   CRP 11.0 (H) 08/05/2019   CRP <0.8 10/28/2018   REPTSTATUS 11/25/2019 FINAL 11/19/2019   GRAMSTAIN  11/19/2019    RARE WBC PRESENT, PREDOMINANTLY PMN RARE GRAM POSITIVE COCCI    CULT  11/19/2019    MODERATE METHICILLIN RESISTANT STAPHYLOCOCCUS AUREUS NO ANAEROBES ISOLATED Performed at [facility], [address], [place], [place] [address]    LABORGA METHICILLIN RESISTANT STAPHYLOCOCCUS AUREUS 11/19/2019     Lab Results  Component Value Date    ALBUMIN 3.4 (L) 11/19/2019   ALBUMIN 2.7 (L) 11/04/2019   ALBUMIN 2.4 (L) 10/31/2019   PREALBUMIN 11.2 (L) 05/08/2018    Lab Results  Component Value Date   MG 1.5 (L) 11/04/2019   MG 1.5 (L) 10/30/2019   MG 1.7 10/29/2019   No results found for: VD25OH  Lab Results  Component Value Date   PREALBUMIN 11.2 (L) 05/08/2018   CBC EXTENDED Latest Ref Rng & Units 11/19/2019 11/06/2019 11/04/2019  WBC 4.0 - 10.5 K/uL 9.5 4.7 6.1  RBC 4.22 - 5.81 MIL/uL 3.62(L) 3.41(L) 3.52(L)  HGB 13.0 - 17.0 g/dL 11.2(L) 10.5(L) 10.7(L)  HCT 39 - 52 % 34.8(L) 32.3(L) 33.5(L)  PLT 150 - 400 K/uL 145(L) 123(L) 139(L)  NEUTROABS 1.7 - 7.7 K/uL 6.6 - -  LYMPHSABS 0.7 - 4.0 K/uL 1.6 - -     Body mass index is 17.04 kg/m.  Orders:  No orders of the defined types were placed in this encounter.  No orders of the defined types were placed in this encounter.    Procedures: No procedures performed  Clinical Data: No additional findings.  ROS:  All other systems negative, except as noted in the HPI. Review of Systems  Objective: Vital Signs: Ht 6\' 4"  (1.93 m)   Wt 140 lb (63.5  kg)   BMI 17.04 kg/m   Specialty Comments:  No specialty comments available.  PMFS History: Patient Active Problem List   Diagnosis Date Noted  . Hyponatremia 10/23/2019  . MRSA bacteremia 10/21/2019  . Atrial flutter with rapid ventricular response (HCC) 10/21/2019  . Lactic acidosis 10/21/2019  . Severe protein-calorie malnutrition (HCC)   . Diabetic polyneuropathy associated with type 2 diabetes mellitus (HCC)   . Osteomyelitis of left foot (HCC) 10/19/2019  . Cirrhosis of liver (HCC) 10/19/2019  . Anemia 10/19/2019  . Medication monitoring encounter 08/15/2019  . Serum total bilirubin elevated 10/29/2018  . BPH (benign prostatic hyperplasia) 10/29/2018  . Non-pressure chronic ulcer of other part of left foot limited to breakdown of skin (HCC)   . Below-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)   . Hx  of BKA, right (HCC)   . PAF (paroxysmal atrial fibrillation) (HCC)   . Hypokalemia   . Hypomagnesemia   . Sepsis without acute organ dysfunction (HCC) 10/05/2018  . Depressed bipolar disorder (HCC) 10/05/2018  . Hypertension 10/05/2018  . Hyperbilirubinemia 10/05/2018  . S/P transmetatarsal amputation of foot, 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  . Wound infection 05/07/2018  . Homelessness 05/07/2018  . Diabetes mellitus type II, non insulin dependent (HCC) 05/07/2018   Past Medical History:  Diagnosis Date  . Anxiety   . Depressed bipolar disorder (HCC)   . Diabetes mellitus without complication (HCC)   . Hypertension   . Osteomyelitis of toe of left foot (HCC)     Family History  Problem Relation Age of Onset  . Hypertension Other   . Diabetes Mellitus II Other     Past Surgical History:  Procedure Laterality Date  . AMPUTATION Left 09/28/2018   Procedure: LEFT TRANSMETATARSAL AMPUTATION;  Surgeon: Nadara Mustard, MD;  Location: [facility] OR;  Service: Orthopedics;  Laterality: Left;  . AMPUTATION Left 10/23/2019   Procedure: AMPUTATION BELOW KNEE;  Surgeon: Nadara Mustard, MD;  Location: [facility] OR;  Service: Orthopedics;  Laterality: Left;  . BELOW KNEE LEG AMPUTATION Right   . TEE WITHOUT CARDIOVERSION N/A 10/25/2019   Procedure: TRANSESOPHAGEAL ECHOCARDIOGRAM (TEE);  Surgeon: Lewayne Bunting, MD;  Location: [facility] ENDOSCOPY;  Service: Cardiovascular;  Laterality: N/A;   Social History   Occupational History  . Not on file  Tobacco Use  . Smoking status: Current Every Day Smoker    Types: Cigarettes  . Smokeless tobacco: Never Used  Vaping Use  . Vaping Use: Never used  Substance and Sexual Activity  . Alcohol use: Not Currently    Comment: occasional  . Drug use: Not Currently    Types: Cocaine  . Sexual activity: Not on file

## 2020-03-10 ENCOUNTER — Telehealth: Payer: Self-pay | Admitting: Orthopedic Surgery

## 2020-03-10 NOTE — Telephone Encounter (Signed)
Monserat from [facility] called.   The patient called requesting transportation to a facility she had not heard of. She needs [place] to call back to see if this is somewhere we sent him so that she can determine transportation eligibility.   Call back: [phone]

## 2020-03-11 NOTE — Telephone Encounter (Signed)
I called and advised that the pt was only referred to [facility] neuro rehab. [facility] but she advised they only provide transportation to [facility] unless authorized by the doctor.

## 2020-05-02 ENCOUNTER — Emergency Department (HOSPITAL_COMMUNITY)
Admission: EM | Admit: 2020-05-02 | Discharge: 2020-05-02 | Disposition: A | Discharge status: Home / Self Care | Payer: Medicare Other | Attending: Emergency Medicine | Admitting: Emergency Medicine

## 2020-05-02 ENCOUNTER — Other Ambulatory Visit: Payer: Self-pay

## 2020-05-02 ENCOUNTER — Encounter (HOSPITAL_COMMUNITY): Payer: Self-pay

## 2020-05-02 ENCOUNTER — Emergency Department (HOSPITAL_COMMUNITY): Payer: Medicare Other

## 2020-05-02 DIAGNOSIS — Z7984 Long term (current) use of oral hypoglycemic drugs: Secondary | ICD-10-CM | POA: Diagnosis not present

## 2020-05-02 DIAGNOSIS — R220 Localized swelling, mass and lump, head: Secondary | ICD-10-CM | POA: Insufficient documentation

## 2020-05-02 DIAGNOSIS — F17210 Nicotine dependence, cigarettes, uncomplicated: Secondary | ICD-10-CM | POA: Diagnosis not present

## 2020-05-02 DIAGNOSIS — Y9389 Activity, other specified: Secondary | ICD-10-CM | POA: Insufficient documentation

## 2020-05-02 DIAGNOSIS — I10 Essential (primary) hypertension: Secondary | ICD-10-CM | POA: Insufficient documentation

## 2020-05-02 DIAGNOSIS — H1132 Conjunctival hemorrhage, left eye: Secondary | ICD-10-CM | POA: Diagnosis not present

## 2020-05-02 DIAGNOSIS — S0592XA Unspecified injury of left eye and orbit, initial encounter: Secondary | ICD-10-CM | POA: Insufficient documentation

## 2020-05-02 DIAGNOSIS — E1142 Type 2 diabetes mellitus with diabetic polyneuropathy: Secondary | ICD-10-CM | POA: Insufficient documentation

## 2020-05-02 MED ORDER — TETRACAINE HCL 0.5 % OP SOLN
2.0000 [drp] | Freq: Once | OPHTHALMIC | Status: AC
  Administered 2020-05-02: 2 [drp] via OPHTHALMIC
  Filled 2020-05-02: qty 4
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
## 2020-05-02 NOTE — Discharge Instructions (Addendum)
If you develop any changes in vision, discharge from your eye, or have any new or worsening concerns please seek additional medical care and evaluation.    Please schedule a follow-up appointment with the eye doctor.  Please do not press on, rub, or otherwise irritate your left eye.  Your CT scans today were overall reassuring.  It does show that you have some arthritis and wear and tear type changes in your neck.
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
## 2020-05-05 ENCOUNTER — Ambulatory Visit: Payer: Medicare Other | Admitting: Family

## 2020-05-05 ENCOUNTER — Ambulatory Visit: Payer: Medicare Other | Attending: Physician Assistant | Admitting: Physical Therapy

## 2020-05-05 ENCOUNTER — Other Ambulatory Visit: Payer: Self-pay

## 2020-05-05 DIAGNOSIS — M6281 Muscle weakness (generalized): Secondary | ICD-10-CM | POA: Diagnosis present

## 2020-05-05 DIAGNOSIS — R208 Other disturbances of skin sensation: Secondary | ICD-10-CM | POA: Diagnosis present

## 2020-05-05 DIAGNOSIS — R293 Abnormal posture: Secondary | ICD-10-CM

## 2020-05-05 DIAGNOSIS — M25511 Pain in right shoulder: Secondary | ICD-10-CM | POA: Diagnosis present

## 2020-05-05 DIAGNOSIS — G8929 Other chronic pain: Secondary | ICD-10-CM | POA: Diagnosis present

## 2020-05-05 DIAGNOSIS — R29898 Other symptoms and signs involving the musculoskeletal system: Secondary | ICD-10-CM | POA: Diagnosis present

## 2020-05-05 DIAGNOSIS — R296 Repeated falls: Secondary | ICD-10-CM
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
## 2020-05-06 ENCOUNTER — Encounter: Payer: Self-pay | Admitting: Family

## 2020-05-06 ENCOUNTER — Ambulatory Visit (INDEPENDENT_AMBULATORY_CARE_PROVIDER_SITE_OTHER): Payer: Medicare Other | Admitting: Family

## 2020-05-06 VITALS — Ht 76.0 in | Wt 140.0 lb

## 2020-05-06 DIAGNOSIS — Z89511 Acquired absence of right leg below knee: Secondary | ICD-10-CM | POA: Diagnosis not present

## 2020-05-06 DIAGNOSIS — S88112A Complete traumatic amputation at level between knee and ankle, left lower leg, initial encounter: Secondary | ICD-10-CM | POA: Insufficient documentation

## 2020-05-06 NOTE — Progress Notes (Signed)
Office Visit Note   Patient: Cory Palmer           Date of Birth: 09/29/1960           MRN: [id number] Visit Date: 05/06/2020              Requested by: No referring provider defined for this encounter. PCP: Patient, [name] Per  Chief Complaint  Patient presents with  . Left Leg - Follow-up    10/23/19 left BKA       HPI: The patient is a 59 year old gentleman seen today in follow-up for his left below-knee amputation.  He is currently working with a prosthetists to get set up he has not yet received his prosthetic.  Of note he is status post right below-knee amputation he does have a prosthetic but has not been able to get his leg into it recently due to edema to his right residual limb.  He states he does not have any shrinkers for his right residual limb.  Also pointing out that his liners for his right limb are worn out and have holes that are broken down  Assessment & Plan: Visit Diagnoses:  1. 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HCC)     Plan: Proceed with prosthesis set up as well as physical therapy did provide an order for prosthesis supplies so he can get shrinkers and a new liner on the right  Follow-Up Instructions: Return if symptoms worsen or fail to improve.   Ortho Exam  Patient is alert, oriented, no adenopathy, well-dressed, normal affect, normal respiratory effort. On examination of the left residual limb this is well consolidated well-healed there is no erythema no callus no skin breakdown  Imaging: No results found. No images are attached to the encounter.  Labs: Lab Results  Component Value Date   HGBA1C 6.8 (H) 10/19/2019   HGBA1C 6.6 (H) 09/28/2018   HGBA1C 7.5 (H) 05/08/2018   ESRSEDRATE 34 (H) 08/15/2019   ESRSEDRATE 31 (H) 08/05/2019   ESRSEDRATE 33 (H) 10/28/2018   CRP 15.0 (H) 08/15/2019   CRP 11.0 (H) 08/05/2019   CRP <0.8 10/28/2018   REPTSTATUS 11/25/2019 FINAL 11/19/2019   GRAMSTAIN  11/19/2019    RARE WBC PRESENT,  PREDOMINANTLY PMN RARE GRAM POSITIVE COCCI    CULT  11/19/2019    MODERATE METHICILLIN RESISTANT STAPHYLOCOCCUS AUREUS NO ANAEROBES ISOLATED Performed at [facility], [address] [place], [place] [address]    LABORGA METHICILLIN RESISTANT STAPHYLOCOCCUS AUREUS 11/19/2019     Lab Results  Component Value Date   ALBUMIN 3.4 (L) 11/19/2019   ALBUMIN 2.7 (L) 11/04/2019   ALBUMIN 2.4 (L) 10/31/2019   PREALBUMIN 11.2 (L) 05/08/2018    Lab Results  Component Value Date   MG 1.5 (L) 11/04/2019   MG 1.5 (L) 10/30/2019   MG 1.7 10/29/2019   No results found for: VD25OH  Lab Results  Component Value Date   PREALBUMIN 11.2 (L) 05/08/2018   CBC EXTENDED Latest Ref Rng & Units 11/19/2019 11/06/2019 11/04/2019  WBC 4.0 - 10.5 K/uL 9.5 4.7 6.1  RBC 4.22 - 5.81 MIL/uL 3.62(L) 3.41(L) 3.52(L)  HGB 13.0 - 17.0 g/dL 11.2(L) 10.5(L) 10.7(L)  HCT 39 - 52 % 34.8(L) 32.3(L) 33.5(L)  PLT 150 - 400 K/uL 145(L) 123(L) 139(L)  NEUTROABS 1.7 - 7.7 K/uL 6.6 - -  LYMPHSABS 0.7 - 4.0 K/uL 1.6 - -     Body mass index is 17.04 kg/m.  Orders:  No orders  of the defined types were placed in this encounter.  No orders of the defined types were placed in this encounter.    Procedures: No procedures performed  Clinical Data: No additional findings.  ROS:  All other systems negative, except as noted in the HPI. Review of Systems  Constitutional: Negative for chills and fever.  Cardiovascular: Negative for leg swelling.  Skin: Negative for color change and wound.    Objective: Vital Signs: Ht 6\' 4"  (1.93 m)   Wt 140 lb (63.5 kg)   BMI 17.04 kg/m   Specialty Comments:  No specialty comments available.  PMFS History: Patient Active Problem List   Diagnosis Date Noted  . 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HCC) 05/06/2020  . Hyponatremia 10/23/2019  . MRSA bacteremia 10/21/2019  . Atrial flutter with rapid ventricular response (HCC) 10/21/2019  . Lactic acidosis  10/21/2019  . Severe protein-calorie malnutrition (HCC)   . Diabetic polyneuropathy associated with type 2 diabetes mellitus (HCC)   . Cirrhosis of liver (HCC) 10/19/2019  . Anemia 10/19/2019  . Medication monitoring encounter 08/15/2019  . Serum total bilirubin elevated 10/29/2018  . BPH (benign prostatic hyperplasia) 10/29/2018  . Below-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)   . PAF (paroxysmal atrial fibrillation) (HCC)   . Hypokalemia   . Hypomagnesemia   . Sepsis without acute organ dysfunction (HCC) 10/05/2018  . Depressed bipolar disorder (HCC) 10/05/2018  . Hypertension 10/05/2018  . Hyperbilirubinemia 10/05/2018  . Homelessness 05/07/2018  . Diabetes mellitus type II, non insulin dependent (HCC) 05/07/2018   Past Medical History:  Diagnosis Date  . Anxiety   . Depressed bipolar disorder (HCC)   . Diabetes mellitus without complication (HCC)   . Hypertension   . Osteomyelitis of left foot (HCC) 10/19/2019  . Osteomyelitis of toe of left foot (HCC)   . S/P transmetatarsal amputation of foot, 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    Family History  Problem Relation Age of Onset  . Hypertension Other   . Diabetes Mellitus II Other     Past Surgical History:  Procedure Laterality Date  . AMPUTATION Left 09/28/2018   Procedure: LEFT TRANSMETATARSAL AMPUTATION;  Surgeon: 09/30/2018, MD;  Location: [facility] OR;  Service: Orthopedics;  Laterality: Left;  . AMPUTATION Left 10/23/2019   Procedure: AMPUTATION BELOW KNEE;  Surgeon: 10/25/2019, MD;  Location: [facility] OR;  Service: Orthopedics;  Laterality: Left;  . BELOW KNEE LEG AMPUTATION Right   . TEE WITHOUT CARDIOVERSION N/A 10/25/2019   Procedure: TRANSESOPHAGEAL ECHOCARDIOGRAM (TEE);  Surgeon: 10/27/2019, MD;  Location[facility] ENDOSCOPY;  Service: Cardiovascular;  Laterality: N/A;   Social History   Occupational History  . Not on file  Tobacco Use  . Smoking status: Current Every Day Smoker    Types: Cigarettes  . Smokeless  tobacco: Never Used  Vaping Use  . Vaping Use: Never used  Substance and Sexual Activity  . Alcohol use: Not Currently    Comment: occasional  . Drug use: Not Currently    Types: Cocaine  . Sexual activity: Not on file

## 2020-05-26 ENCOUNTER — Telehealth: Payer: Self-pay | Admitting: Orthopedic Surgery

## 2020-05-26 NOTE — Telephone Encounter (Signed)
Aram Beecham with [employer] would like  To know if we received the forms they faxed on 10/15/2? Aram Beecham would like a CB with answer

## 2020-05-28 NOTE — Telephone Encounter (Signed)
This has been done and faxed to company.

## 2020-06-03 ENCOUNTER — Telehealth: Payer: Self-pay | Admitting: Orthopedic Surgery

## 2020-06-03 NOTE — Telephone Encounter (Signed)
Cory Palmer with [employer] called stating she didn't receive the forms in regards to a powered chair  and would like for our office to try faxing them again   Fax# [phone]

## 2020-06-04 NOTE — Telephone Encounter (Signed)
I know that these forms were done do you have a copy?

## 2020-06-04 NOTE — Telephone Encounter (Signed)
re-faxed

## 2020-06-09 ENCOUNTER — Telehealth: Payer: Self-pay | Admitting: Orthopedic Surgery

## 2020-06-09 NOTE — Telephone Encounter (Signed)
Received vm from [place] w/ [employer]. She still has not received the paperwork that we have faxed twice. IC, lmvm [phone] advised that the fax number she left is where I have been faxing to. Advised she can call and lve her email and I will send that way.

## 2020-08-24 ENCOUNTER — Telehealth: Payer: Self-pay

## 2020-08-24 NOTE — Telephone Encounter (Signed)
Patient is requesting *liners and socks as well.

## 2020-08-24 NOTE — Telephone Encounter (Signed)
Patient called he is requesting a rx for two boots and lines for [employer]. CB:[phone]

## 2020-08-24 NOTE — Telephone Encounter (Signed)
Order written and faxed to [employer] to also contact the pt and set up appt.

## 2020-09-16 ENCOUNTER — Encounter: Payer: Self-pay | Admitting: Physician Assistant

## 2020-09-16 ENCOUNTER — Ambulatory Visit (INDEPENDENT_AMBULATORY_CARE_PROVIDER_SITE_OTHER): Payer: Medicare Other | Admitting: Physician Assistant

## 2020-09-16 DIAGNOSIS — Z89512 Acquired absence of left leg below knee: Secondary | ICD-10-CM | POA: Diagnosis not present

## 2020-09-16 DIAGNOSIS — S88112A Complete traumatic amputation at level between knee and ankle, left lower leg, initial encounter: Secondary | ICD-10-CM

## 2020-09-16 NOTE — Progress Notes (Signed)
Office Visit Note   Patient: Cory Palmer           Date of Birth: May 29, 1961           MRN: [id number] Visit Date: 09/16/2020              Requested by: No referring provider defined for this encounter. PCP: Patient, [name] Per  No chief complaint on file.     HPI: This is a pleasant 60 year old gentleman who is approximately 18 months status post left below-knee amputation at approximately 8 years status post right below-knee amputation.  Right now he is unable to wear either of his prosthetics because they are ill fitting and loose.  He has tried to collect correct this with more socks but it is still not working well.  He has had some falls because of this.  Assessment & Plan: Visit Diagnoses: No diagnosis found.  Plan: New prescription for bilateral prosthetics and supplies was furnished to him today may follow-up as needed  Follow-Up Instructions: No follow-ups on file.   Ortho Exam  Patient is alert, oriented, no adenopathy, well-dressed, normal affect, normal respiratory effort. Bilateral lower below-knee amputations.  Well-healed surgical incisions.  He has much less volume on the right than the left.  No cellulitis no swelling no skin breakdown Patient is an existing bilateral transtibial  amputee.  Patient's current comorbidities are not expected to impact the ability to function with the prescribed prosthesis. Patient verbally communicates a strong desire to use a prosthesis. Patient currently requires mobility aids to ambulate without a prosthesis.  Expects not to use mobility aids with a new prosthesis.  Patient is a K2 level ambulator that will use a prosthesis to walk around their home and the community over low level environmental barriers.     Imaging: No results found. No images are attached to the encounter.  Labs: Lab Results  Component Value Date   HGBA1C 6.8 (H) 10/19/2019   HGBA1C 6.6 (H) 09/28/2018   HGBA1C 7.5 (H) 05/08/2018   ESRSEDRATE  34 (H) 08/15/2019   ESRSEDRATE 31 (H) 08/05/2019   ESRSEDRATE 33 (H) 10/28/2018   CRP 15.0 (H) 08/15/2019   CRP 11.0 (H) 08/05/2019   CRP <0.8 10/28/2018   REPTSTATUS 11/25/2019 FINAL 11/19/2019   GRAMSTAIN  11/19/2019    RARE WBC PRESENT, PREDOMINANTLY PMN RARE GRAM POSITIVE COCCI    CULT  11/19/2019    MODERATE METHICILLIN RESISTANT STAPHYLOCOCCUS AUREUS NO ANAEROBES ISOLATED Performed at [facility], [address] [place], [place] [address]    LABORGA METHICILLIN RESISTANT STAPHYLOCOCCUS AUREUS 11/19/2019     Lab Results  Component Value Date   ALBUMIN 3.4 (L) 11/19/2019   ALBUMIN 2.7 (L) 11/04/2019   ALBUMIN 2.4 (L) 10/31/2019   PREALBUMIN 11.2 (L) 05/08/2018    Lab Results  Component Value Date   MG 1.5 (L) 11/04/2019   MG 1.5 (L) 10/30/2019   MG 1.7 10/29/2019   No results found for: VD25OH  Lab Results  Component Value Date   PREALBUMIN 11.2 (L) 05/08/2018   CBC EXTENDED Latest Ref Rng & Units 11/19/2019 11/06/2019 11/04/2019  WBC 4.0 - 10.5 K/uL 9.5 4.7 6.1  RBC 4.22 - 5.81 MIL/uL 3.62(L) 3.41(L) 3.52(L)  HGB 13.0 - 17.0 g/dL 11.2(L) 10.5(L) 10.7(L)  HCT 39.0 - 52.0 % 34.8(L) 32.3(L) 33.5(L)  PLT 150 - 400 K/uL 145(L) 123(L) 139(L)  NEUTROABS 1.7 - 7.7 K/uL 6.6 - -  LYMPHSABS 0.7 - 4.0 K/uL 1.6 - -  There is no height or weight on file to calculate BMI.  Orders:  No orders of the defined types were placed in this encounter.  No orders of the defined types were placed in this encounter.    Procedures: No procedures performed  Clinical Data: No additional findings.  ROS:  All other systems negative, except as noted in the HPI. Review of Systems  Objective: Vital Signs: There were no vitals taken for this visit.  Specialty Comments:  No specialty comments available.  PMFS History: Patient Active Problem List   Diagnosis Date Noted  . 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HCC) 05/06/2020  . Hyponatremia 10/23/2019  .  MRSA bacteremia 10/21/2019  . Atrial flutter with rapid ventricular response (HCC) 10/21/2019  . Lactic acidosis 10/21/2019  . Severe protein-calorie malnutrition (HCC)   . Diabetic polyneuropathy associated with type 2 diabetes mellitus (HCC)   . Cirrhosis of liver (HCC) 10/19/2019  . Anemia 10/19/2019  . Medication monitoring encounter 08/15/2019  . Serum total bilirubin elevated 10/29/2018  . BPH (benign prostatic hyperplasia) 10/29/2018  . Below-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)   . PAF (paroxysmal atrial fibrillation) (HCC)   . Hypokalemia   . Hypomagnesemia   . Sepsis without acute organ dysfunction (HCC) 10/05/2018  . Depressed bipolar disorder (HCC) 10/05/2018  . Hypertension 10/05/2018  . Hyperbilirubinemia 10/05/2018  . Homelessness 05/07/2018  . Diabetes mellitus type II, non insulin dependent (HCC) 05/07/2018   Past Medical History:  Diagnosis Date  . Anxiety   . Depressed bipolar disorder (HCC)   . Diabetes mellitus without complication (HCC)   . Hypertension   . Osteomyelitis of left foot (HCC) 10/19/2019  . Osteomyelitis of toe of left foot (HCC)   . S/P transmetatarsal amputation of foot, 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    Family History  Problem Relation Age of Onset  . Hypertension Other   . Diabetes Mellitus II Other     Past Surgical History:  Procedure Laterality Date  . AMPUTATION Left 09/28/2018   Procedure: LEFT TRANSMETATARSAL AMPUTATION;  Surgeon: Nadara Mustard, MD;  Location: [facility] OR;  Service: Orthopedics;  Laterality: Left;  . AMPUTATION Left 10/23/2019   Procedure: AMPUTATION BELOW KNEE;  Surgeon: Nadara Mustard, MD;  Location: [facility] OR;  Service: Orthopedics;  Laterality: Left;  . BELOW KNEE LEG AMPUTATION Right   . TEE WITHOUT CARDIOVERSION N/A 10/25/2019   Procedure: TRANSESOPHAGEAL ECHOCARDIOGRAM (TEE);  Surgeon: Lewayne Bunting, MD;  Location: [facility] ENDOSCOPY;  Service: Cardiovascular;  Laterality: N/A;   Social History   Occupational  History  . Not on file  Tobacco Use  . Smoking status: Current Every Day Smoker    Types: Cigarettes  . Smokeless tobacco: Never Used  Vaping Use  . Vaping Use: Never used  Substance and Sexual Activity  . Alcohol use: Not Currently    Comment: occasional  . Drug use: Not Currently    Types: Cocaine  . Sexual activity: Not on file

## 2020-10-10 ENCOUNTER — Inpatient Hospital Stay (HOSPITAL_COMMUNITY)
Admission: EM | Admit: 2020-10-10 | Discharge: 2020-10-20 | DRG: 368 | Disposition: A | Discharge status: Home / Self Care | Payer: Medicare Other | Attending: Internal Medicine | Admitting: Internal Medicine

## 2020-10-10 ENCOUNTER — Encounter (HOSPITAL_COMMUNITY): Payer: Self-pay | Admitting: Emergency Medicine

## 2020-10-10 ENCOUNTER — Other Ambulatory Visit: Payer: Self-pay

## 2020-10-10 ENCOUNTER — Emergency Department (HOSPITAL_COMMUNITY): Payer: Medicare Other

## 2020-10-10 DIAGNOSIS — R6510 Systemic inflammatory response syndrome (SIRS) of non-infectious origin without acute organ dysfunction: Secondary | ICD-10-CM

## 2020-10-10 DIAGNOSIS — K766 Portal hypertension: Secondary | ICD-10-CM | POA: Diagnosis present

## 2020-10-10 DIAGNOSIS — K2101 Gastro-esophageal reflux disease with esophagitis, with bleeding: Secondary | ICD-10-CM | POA: Diagnosis not present

## 2020-10-10 DIAGNOSIS — K7460 Unspecified cirrhosis of liver: Secondary | ICD-10-CM | POA: Diagnosis present

## 2020-10-10 DIAGNOSIS — K2970 Gastritis, unspecified, without bleeding: Secondary | ICD-10-CM | POA: Diagnosis present

## 2020-10-10 DIAGNOSIS — R1310 Dysphagia, unspecified: Secondary | ICD-10-CM | POA: Diagnosis present

## 2020-10-10 DIAGNOSIS — K254 Chronic or unspecified gastric ulcer with hemorrhage: Secondary | ICD-10-CM | POA: Diagnosis present

## 2020-10-10 DIAGNOSIS — D72829 Elevated white blood cell count, unspecified: Secondary | ICD-10-CM | POA: Diagnosis present

## 2020-10-10 DIAGNOSIS — F17210 Nicotine dependence, cigarettes, uncomplicated: Secondary | ICD-10-CM | POA: Diagnosis present

## 2020-10-10 DIAGNOSIS — Z8249 Family history of ischemic heart disease and other diseases of the circulatory system: Secondary | ICD-10-CM

## 2020-10-10 DIAGNOSIS — Z89512 Acquired absence of left leg below knee: Secondary | ICD-10-CM

## 2020-10-10 DIAGNOSIS — E876 Hypokalemia: Secondary | ICD-10-CM | POA: Diagnosis not present

## 2020-10-10 DIAGNOSIS — E872 Acidosis: Secondary | ICD-10-CM | POA: Diagnosis present

## 2020-10-10 DIAGNOSIS — Z89511 Acquired absence of right leg below knee: Secondary | ICD-10-CM

## 2020-10-10 DIAGNOSIS — E871 Hypo-osmolality and hyponatremia: Secondary | ICD-10-CM | POA: Diagnosis present

## 2020-10-10 DIAGNOSIS — I10 Essential (primary) hypertension: Secondary | ICD-10-CM | POA: Diagnosis present

## 2020-10-10 DIAGNOSIS — K5900 Constipation, unspecified: Secondary | ICD-10-CM | POA: Diagnosis present

## 2020-10-10 DIAGNOSIS — F419 Anxiety disorder, unspecified: Secondary | ICD-10-CM | POA: Diagnosis present

## 2020-10-10 DIAGNOSIS — E1165 Type 2 diabetes mellitus with hyperglycemia: Secondary | ICD-10-CM | POA: Diagnosis present

## 2020-10-10 DIAGNOSIS — N179 Acute kidney failure, unspecified: Secondary | ICD-10-CM | POA: Diagnosis present

## 2020-10-10 DIAGNOSIS — D696 Thrombocytopenia, unspecified: Secondary | ICD-10-CM | POA: Diagnosis not present

## 2020-10-10 DIAGNOSIS — K269 Duodenal ulcer, unspecified as acute or chronic, without hemorrhage or perforation: Secondary | ICD-10-CM | POA: Diagnosis present

## 2020-10-10 DIAGNOSIS — S88112A Complete traumatic amputation at level between knee and ankle, left lower leg, initial encounter: Secondary | ICD-10-CM | POA: Diagnosis not present

## 2020-10-10 DIAGNOSIS — R220 Localized swelling, mass and lump, head: Secondary | ICD-10-CM

## 2020-10-10 DIAGNOSIS — E1142 Type 2 diabetes mellitus with diabetic polyneuropathy: Secondary | ICD-10-CM

## 2020-10-10 DIAGNOSIS — F3130 Bipolar disorder, current episode depressed, mild or moderate severity, unspecified: Secondary | ICD-10-CM | POA: Diagnosis present

## 2020-10-10 DIAGNOSIS — Z833 Family history of diabetes mellitus: Secondary | ICD-10-CM

## 2020-10-10 DIAGNOSIS — R600 Localized edema: Secondary | ICD-10-CM | POA: Diagnosis present

## 2020-10-10 DIAGNOSIS — I159 Secondary hypertension, unspecified: Secondary | ICD-10-CM

## 2020-10-10 DIAGNOSIS — F319 Bipolar disorder, unspecified: Secondary | ICD-10-CM | POA: Diagnosis present

## 2020-10-10 DIAGNOSIS — D509 Iron deficiency anemia, unspecified: Secondary | ICD-10-CM | POA: Diagnosis present

## 2020-10-10 DIAGNOSIS — Z79899 Other long term (current) drug therapy: Secondary | ICD-10-CM

## 2020-10-10 DIAGNOSIS — Z7984 Long term (current) use of oral hypoglycemic drugs: Secondary | ICD-10-CM

## 2020-10-10 DIAGNOSIS — E119 Type 2 diabetes mellitus without complications: Secondary | ICD-10-CM

## 2020-10-10 DIAGNOSIS — S88111A Complete traumatic amputation at level between knee and ankle, right lower leg, initial encounter: Secondary | ICD-10-CM | POA: Diagnosis present

## 2020-10-10 DIAGNOSIS — K219 Gastro-esophageal reflux disease without esophagitis: Secondary | ICD-10-CM

## 2020-10-10 DIAGNOSIS — Z20822 Contact with and (suspected) exposure to covid-19: Secondary | ICD-10-CM | POA: Diagnosis present

## 2020-10-10 DIAGNOSIS — T380X5A Adverse effect of glucocorticoids and synthetic analogues, initial encounter: Secondary | ICD-10-CM | POA: Diagnosis present

## 2020-10-10 LAB — COMPREHENSIVE METABOLIC PANEL
ALT: 20 U/L (ref 0–44)
AST: 34 U/L (ref 15–41)
Albumin: 3.9 g/dL (ref 3.5–5.0)
Alkaline Phosphatase: 86 U/L (ref 38–126)
Anion gap: 14 (ref 5–15)
BUN: 33 mg/dL — ABNORMAL HIGH (ref 6–20)
CO2: 20 mmol/L — ABNORMAL LOW (ref 22–32)
Calcium: 9.4 mg/dL (ref 8.9–10.3)
Chloride: 100 mmol/L (ref 98–111)
Creatinine, Ser: 1.36 mg/dL — ABNORMAL HIGH (ref 0.61–1.24)
GFR, Estimated: 60 mL/min — ABNORMAL LOW (ref >60)
Glucose, Bld: 143 mg/dL — ABNORMAL HIGH (ref 70–99)
Potassium: 4.1 mmol/L (ref 3.5–5.1)
Sodium: 134 mmol/L — ABNORMAL LOW (ref 135–145)
Total Bilirubin: 3.4 mg/dL — ABNORMAL HIGH (ref 0.3–1.2)
Total Protein: 7.4 g/dL (ref 6.5–8.1)

## 2020-10-10 LAB — CBC WITH DIFFERENTIAL/PLATELET
Abs Immature Granulocytes: 0.26 10*3/uL — ABNORMAL HIGH (ref 0.00–0.07)
Basophils Absolute: 0.1 10*3/uL (ref 0.0–0.1)
Basophils Relative: 0 %
Eosinophils Absolute: 0.2 10*3/uL (ref 0.0–0.5)
Eosinophils Relative: 1 %
HCT: 41.2 % (ref 39.0–52.0)
Hemoglobin: 14.6 g/dL (ref 13.0–17.0)
Immature Granulocytes: 1 %
Lymphocytes Relative: 11 %
Lymphs Abs: 3.4 10*3/uL (ref 0.7–4.0)
MCH: 32.9 pg (ref 26.0–34.0)
MCHC: 35.4 g/dL (ref 30.0–36.0)
MCV: 92.8 fL (ref 80.0–100.0)
Monocytes Absolute: 3.2 10*3/uL — ABNORMAL HIGH (ref 0.1–1.0)
Monocytes Relative: 10 %
Neutro Abs: 25.3 10*3/uL — ABNORMAL HIGH (ref 1.7–7.7)
Neutrophils Relative %: 77 %
Platelets: 184 10*3/uL (ref 150–400)
RBC: 4.44 MIL/uL (ref 4.22–5.81)
RDW: 14 % (ref 11.5–15.5)
WBC: 32.4 10*3/uL — ABNORMAL HIGH (ref 4.0–10.5)
nRBC: 0 % (ref 0.0–0.2)

## 2020-10-10 LAB — GROUP A STREP BY PCR: Group A Strep by PCR: NOT DETECTED

## 2020-10-10 LAB — CBC
HCT: 43.6 % (ref 39.0–52.0)
Hemoglobin: 14.9 g/dL (ref 13.0–17.0)
MCH: 32.5 pg (ref 26.0–34.0)
MCHC: 34.2 g/dL (ref 30.0–36.0)
MCV: 95.2 fL (ref 80.0–100.0)
Platelets: 203 10*3/uL (ref 150–400)
RBC: 4.58 MIL/uL (ref 4.22–5.81)
RDW: 14.2 % (ref 11.5–15.5)
WBC: 31.6 10*3/uL — ABNORMAL HIGH (ref 4.0–10.5)
nRBC: 0 % (ref 0.0–0.2)

## 2020-10-10 LAB — LACTIC ACID, PLASMA
Lactic Acid, Venous: 2.7 mmol/L (ref 0.5–1.9)
Lactic Acid, Venous: 3.3 mmol/L (ref 0.5–1.9)

## 2020-10-10 LAB — HEMOGLOBIN A1C
Hgb A1c MFr Bld: 6.6 % — ABNORMAL HIGH (ref 4.8–5.6)
Mean Plasma Glucose: 142.72 mg/dL

## 2020-10-10 LAB — RESP PANEL BY RT-PCR (FLU A&B, COVID) ARPGX2
Influenza A by PCR: NEGATIVE
Influenza B by PCR: NEGATIVE
SARS Coronavirus 2 by RT PCR: NEGATIVE

## 2020-10-10 LAB — TROPONIN I (HIGH SENSITIVITY)
Troponin I (High Sensitivity): 10 ng/L (ref <18)
Troponin I (High Sensitivity): 12 ng/L (ref <18)

## 2020-10-10 LAB — LIPASE, BLOOD: Lipase: 28 U/L (ref 11–51)

## 2020-10-10 LAB — GLUCOSE, CAPILLARY: Glucose-Capillary: 189 mg/dL — ABNORMAL HIGH (ref 70–99)

## 2020-10-10 MED ORDER — LACTATED RINGERS IV SOLN: INTRAVENOUS | Status: DC

## 2020-10-10 MED ORDER — ENOXAPARIN SODIUM 40 MG/0.4ML ~~LOC~~ SOLN
40.0000 mg | SUBCUTANEOUS | Status: DC
  Administered 2020-10-10 – 2020-10-20 (×11): 40 mg via SUBCUTANEOUS
  Filled 2020-10-10 (×11): qty 0.4

## 2020-10-10 MED ORDER — ADULT MULTIVITAMIN W/MINERALS CH
1.0000 | ORAL_TABLET | Freq: Every day | ORAL | Status: DC
  Administered 2020-10-11 – 2020-10-20 (×9): 1 via ORAL
  Filled 2020-10-10 (×9): qty 1

## 2020-10-10 MED ORDER — MORPHINE SULFATE (PF) 4 MG/ML IV SOLN
4.0000 mg | Freq: Once | INTRAVENOUS | Status: AC
  Administered 2020-10-10: 4 mg via INTRAVENOUS
  Filled 2020-10-10: qty 1

## 2020-10-10 MED ORDER — VANCOMYCIN HCL 1750 MG/350ML IV SOLN
1750.0000 mg | Freq: Once | INTRAVENOUS | Status: AC
  Administered 2020-10-10: 1750 mg via INTRAVENOUS
  Filled 2020-10-10: qty 350

## 2020-10-10 MED ORDER — SODIUM CHLORIDE 0.9 % IV SOLN
2.0000 g | Freq: Once | INTRAVENOUS | Status: AC
  Administered 2020-10-10: 2 g via INTRAVENOUS
  Filled 2020-10-10: qty 2

## 2020-10-10 MED ORDER — PANTOPRAZOLE SODIUM 40 MG IV SOLR
40.0000 mg | Freq: Two times a day (BID) | INTRAVENOUS | Status: DC
  Administered 2020-10-10 – 2020-10-20 (×21): 40 mg via INTRAVENOUS
  Filled 2020-10-10 (×20): qty 40

## 2020-10-10 MED ORDER — ONDANSETRON HCL 4 MG PO TABS: 4.0000 mg | ORAL_TABLET | Freq: Four times a day (QID) | ORAL | Status: DC | PRN

## 2020-10-10 MED ORDER — PANTOPRAZOLE SODIUM 40 MG IV SOLR
40.0000 mg | Freq: Once | INTRAVENOUS | Status: AC
  Administered 2020-10-10: 40 mg via INTRAVENOUS
  Filled 2020-10-10: qty 40

## 2020-10-10 MED ORDER — OXYCODONE-ACETAMINOPHEN 5-325 MG PO TABS
1.0000 | ORAL_TABLET | ORAL | Status: DC | PRN
  Administered 2020-10-11 (×4): 1 via ORAL
  Filled 2020-10-10 (×4): qty 1

## 2020-10-10 MED ORDER — IOHEXOL 350 MG/ML SOLN
100.0000 mL | Freq: Once | INTRAVENOUS | Status: AC | PRN
  Administered 2020-10-10: 100 mL via INTRAVENOUS

## 2020-10-10 MED ORDER — SERTRALINE HCL 50 MG PO TABS
50.0000 mg | ORAL_TABLET | Freq: Every day | ORAL | Status: DC
  Administered 2020-10-10 – 2020-10-20 (×10): 50 mg via ORAL
  Filled 2020-10-10 (×10): qty 1

## 2020-10-10 MED ORDER — VANCOMYCIN VARIABLE DOSE PER UNSTABLE RENAL FUNCTION (PHARMACIST DOSING): Status: DC

## 2020-10-10 MED ORDER — ONDANSETRON HCL 4 MG/2ML IJ SOLN: 4.0000 mg | Freq: Four times a day (QID) | INTRAMUSCULAR | Status: DC | PRN

## 2020-10-10 MED ORDER — SODIUM CHLORIDE 0.9 % IV SOLN: INTRAVENOUS | Status: DC

## 2020-10-10 MED ORDER — TRAZODONE HCL 150 MG PO TABS
150.0000 mg | ORAL_TABLET | Freq: Every day | ORAL | Status: DC
  Administered 2020-10-10 – 2020-10-20 (×11): 150 mg via ORAL
  Filled 2020-10-10 (×11): qty 1

## 2020-10-10 MED ORDER — INSULIN ASPART 100 UNIT/ML ~~LOC~~ SOLN
0.0000 [IU] | Freq: Three times a day (TID) | SUBCUTANEOUS | Status: DC
  Administered 2020-10-11: 7 [IU] via SUBCUTANEOUS
  Administered 2020-10-11 (×2): 1 [IU] via SUBCUTANEOUS
  Administered 2020-10-12: 2 [IU] via SUBCUTANEOUS
  Administered 2020-10-12: 1 [IU] via SUBCUTANEOUS
  Administered 2020-10-13: 2 [IU] via SUBCUTANEOUS
  Administered 2020-10-13: 1 [IU] via SUBCUTANEOUS
  Administered 2020-10-14: 2 [IU] via SUBCUTANEOUS
  Administered 2020-10-14 (×2): 1 [IU] via SUBCUTANEOUS
  Administered 2020-10-15: 3 [IU] via SUBCUTANEOUS
  Administered 2020-10-15: 1 [IU] via SUBCUTANEOUS
  Administered 2020-10-15: 2 [IU] via SUBCUTANEOUS
  Administered 2020-10-16 – 2020-10-17 (×4): 3 [IU] via SUBCUTANEOUS
  Administered 2020-10-17: 5 [IU] via SUBCUTANEOUS
  Administered 2020-10-17: 7 [IU] via SUBCUTANEOUS
  Administered 2020-10-18: 5 [IU] via SUBCUTANEOUS
  Administered 2020-10-18: 7 [IU] via SUBCUTANEOUS
  Administered 2020-10-18: 9 [IU] via SUBCUTANEOUS
  Administered 2020-10-19: 2 [IU] via SUBCUTANEOUS
  Administered 2020-10-19: 5 [IU] via SUBCUTANEOUS
  Administered 2020-10-20: 7 [IU] via SUBCUTANEOUS
  Administered 2020-10-20: 3 [IU] via SUBCUTANEOUS
  Administered 2020-10-20: 2 [IU] via SUBCUTANEOUS

## 2020-10-10 MED ORDER — QUETIAPINE FUMARATE 50 MG PO TABS
300.0000 mg | ORAL_TABLET | Freq: Every day | ORAL | Status: DC
  Administered 2020-10-10 – 2020-10-20 (×11): 300 mg via ORAL
  Filled 2020-10-10 (×11): qty 6

## 2020-10-10 MED ORDER — ACETAMINOPHEN 325 MG PO TABS: 650.0000 mg | ORAL_TABLET | Freq: Four times a day (QID) | ORAL | Status: DC | PRN

## 2020-10-10 MED ORDER — SUCRALFATE 1 GM/10ML PO SUSP
1.0000 g | Freq: Three times a day (TID) | ORAL | Status: DC
  Administered 2020-10-10 – 2020-10-20 (×36): 1 g via ORAL
  Filled 2020-10-10 (×36): qty 10

## 2020-10-10 MED ORDER — ACETAMINOPHEN 650 MG RE SUPP: 650.0000 mg | Freq: Four times a day (QID) | RECTAL | Status: DC | PRN

## 2020-10-10 MED ORDER — LACTATED RINGERS IV BOLUS
1000.0000 mL | Freq: Once | INTRAVENOUS | Status: AC
  Administered 2020-10-10: 1000 mL via INTRAVENOUS

## 2020-10-10 MED ORDER — MORPHINE SULFATE (PF) 2 MG/ML IV SOLN
2.0000 mg | INTRAVENOUS | Status: DC | PRN
  Administered 2020-10-10 – 2020-10-11 (×3): 2 mg via INTRAVENOUS
  Filled 2020-10-10 (×3): qty 1

## 2020-10-10 MED ORDER — FERROUS SULFATE 325 (65 FE) MG PO TABS
325.0000 mg | ORAL_TABLET | Freq: Every day | ORAL | Status: DC
  Administered 2020-10-11 – 2020-10-20 (×8): 325 mg via ORAL
  Filled 2020-10-10 (×8): qty 1

## 2020-10-10 NOTE — Progress Notes (Signed)
Pharmacy Antibiotic Note  Cory Palmer is a 60 y.o. male admitted on 10/10/2020 with sepsis.  Pharmacy has been consulted for Vancomycin dosing.      Temp (24hrs), Avg:97.9 F (36.6 C), Min:97.9 F (36.6 C), Max:97.9 F (36.6 C)  Recent Labs  Lab 10/10/20 1226 10/10/20 1335  WBC 31.6* 32.4*  CREATININE 1.36*  --   LATICACIDVEN  --  2.7*    CrCl cannot be calculated (Unknown ideal weight.).    No Known Allergies  Antimicrobials this admission: 3/12 Cefepime >>  3/12 Vancomycin >>   Dose adjustments this admission: N/a  Microbiology results: Pending   Plan:  - Vancomycin 1750mg  IV x 1 dose  - Will continue to dose vancomycin based on random levels due to AKI Scr 1.36 (baseline 0.6-0.8) patient also has bilateral BKA - Recommend considering random level in ~ 24 hours if therapy is continued  - Monitor patients renal function and urine output  - De-escalate ABX when appropriate   Thank you for allowing pharmacy to be a part of this patient's care.  PharmD. BCPS 10/10/2020 3:16 PM

## 2020-10-10 NOTE — ED Notes (Signed)
Patient transported to CT 

## 2020-10-10 NOTE — ED Provider Notes (Signed)
[facility] EMERGENCY DEPARTMENT Provider Note   CSN: [id number] Arrival date & time: 10/10/20  1205     History Chief Complaint  Patient presents with   Abdominal Pain   Chest Pain   Oral Swelling    Cory Palmer is a 60 y.o. male.  HPI Patient reports has been having swelling of the lower lip and pain in his throat for several days.  He reports that he also now has pain throughout most of his chest.  He reports it feels tight and aching.  He reports when he swallows there is a lot of pain in his throat.  He has not had any fever that he is aware of.  Occasional cough but not significant.  Patient reports he started to feel very short of breath over the past couple of days.  He reports he also has several areas in his abdomen that seem to hurt.  He reports if he pushes on the right side and hurts over on the left.  No diarrhea or pain or burning with urination. 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  Patient reports he did have some dental work done in the past few weeks.  He has multiple decayed teeth and had several extracted 2 weeks ago.    Past Medical History:  Diagnosis Date   Anxiety    Depressed bipolar disorder ([place])    Diabetes mellitus without complication ([place])    Hypertension    Osteomyelitis of left foot ([place]) 10/19/2019   Osteomyelitis of toe of left foot (HCC)    S/P transmetatarsal amputation of foot, left ([place]) 09/28/2018    Patient Active Problem List   Diagnosis Date Noted   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[place]) 05/06/2020   Hyponatremia 10/23/2019   MRSA bacteremia 10/21/2019   Atrial flutter with rapid ventricular response ([place]) 10/21/2019   Lactic acidosis 10/21/2019   Severe protein-calorie malnutrition ([place])    Diabetic polyneuropathy associated with type 2 diabetes mellitus ([place])    Cirrhosis of liver ([place])  10/19/2019   Anemia 10/19/2019   Medication monitoring encounter 08/15/2019   Serum total bilirubin elevated 10/29/2018   BPH (benign prostatic hyperplasia) 10/29/2018   Below-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)    PAF (paroxysmal atrial fibrillation) ([place])    Hypokalemia    Hypomagnesemia    Sepsis without acute organ dysfunction ([place]) 10/05/2018   Depressed bipolar disorder ([place]) 10/05/2018   Hypertension 10/05/2018   Hyperbilirubinemia 10/05/2018   Homelessness 05/07/2018   Diabetes mellitus type II, non insulin dependent ([place]) 05/07/2018    Past Surgical History:  Procedure Laterality Date   AMPUTATION Left 09/28/2018   Procedure: LEFT TRANSMETATARSAL AMPUTATION;  Surgeon: Newt Minion, MD;  Location: [place];  Service: Orthopedics;  Laterality: Left;   AMPUTATION Left 10/23/2019   Procedure: AMPUTATION BELOW KNEE;  Surgeon: Newt Minion, MD;  Location: [place];  Service: Orthopedics;  Laterality: Left;   BELOW KNEE LEG AMPUTATION Right    TEE WITHOUT CARDIOVERSION N/A 10/25/2019   Procedure: TRANSESOPHAGEAL ECHOCARDIOGRAM (TEE);  Surgeon: Lelon Perla, MD;  Location: [facility]COPY;  Service: Cardiovascular;  Laterality: N/A;       Family History  Problem Relation Age of Onset   Hypertension Other    Diabetes Mellitus II Other     Social History   Tobacco Use   Smoking status: Current Every Day Smoker  Types: Cigarettes   Smokeless tobacco: Never Used  Vaping Use   Vaping Use: Never used  Substance Use Topics   Alcohol use: Not Currently    Comment: occasional   Drug use: Not Currently    Types: Cocaine    Home Medications Prior to Admission medications   Medication Sig Start Date End Date Taking? Authorizing Provider  Blood Glucose Monitoring Suppl (ACCU-CHEK AVIVA PLUS) w/Device KIT  05/23/19   [provider]  doxycycline (VIBRA-TABS) 100 MG tablet Take 1 tablet (100 mg total) by mouth 2 (two) times daily.  12/09/19   Persons, Bevely Palmer, PA  ferrous sulfate 325 (65 FE) MG tablet Take 1 tablet (325 mg total) by mouth daily with breakfast. 11/06/19   Dessa Phi, DO  metFORMIN (GLUCOPHAGE) 1000 MG tablet Take 1 tablet (1,000 mg total) by mouth daily. 10/21/19   Murlean Iba, MD  Multiple Vitamin (MULTIVITAMIN WITH MINERALS) TABS tablet Take 1 tablet by mouth daily. 05/11/18   Danford, Suann Larry, MD  oxyCODONE-acetaminophen (PERCOCET/ROXICET) 5-325 MG tablet Take 1 tablet by mouth every 4 (four) hours as needed for severe pain. 12/26/19   Newt Minion, MD  QUEtiapine (SEROQUEL) 300 MG tablet Take 300 mg by mouth at bedtime. 08/01/19   [provider]  sertraline (ZOLOFT) 50 MG tablet Take 50 mg by mouth daily. 08/01/19   [provider]  traZODone (DESYREL) 150 MG tablet Take 150 mg by mouth at bedtime.    [provider]    Allergies    Patient has no known allergies.  Review of Systems   Review of Systems 10 systems reviewed and negative except as per HPI Physical Exam Updated Vital Signs BP 104/63 (BP Location: Right Arm)    Pulse (!) 103    Temp 97.9 F (36.6 C)    Resp (!) 22    SpO2 99%   Physical Exam Constitutional:      Comments: Patient is alert with clear mental status.  He is somewhat anxious in appearance.  He is hyperventilating.  He is speaking in full sentences without difficulty.  Color is good.  HENT:     Head: Normocephalic and atraumatic.     Mouth/Throat:     Comments: Large swelling of the lower lip.  The lip is indurated and tender.  Teeth behind the lower lip are decayed and eroded to the gumline.  The oropharynx is widely patent.  Posterior airway has no swelling or exudate.  Tongue is not enlarged.  No stridor. Eyes:     Extraocular Movements: Extraocular movements intact.  Neck:     Comments: No meningismus Cardiovascular:     Comments: Tachycardic.  Regular Pulmonary:     Comments: Patient is tachypneic.  Lungs are grossly  clear. Abdominal:     Palpations: Abdomen is soft.     Comments: Abdomen is soft.  Patient endorses some mild diffuse discomfort both left and right without any guarding or palpable mass  Musculoskeletal:     Comments: Bilateral below the knee amputations.  Both are well-healed.  No edema of the lower legs.  Knees are in good conditions without effusions  Skin:    General: Skin is warm and dry.  Neurological:     Comments: Patient is alert.  His speech is clear.  Movements are coordinated purposeful.  No focal motor deficits.  Psychiatric:     Comments: Patient is moderately anxious but appropriate and interactive.  ED Results / Procedures / Treatments   Labs (all labs ordered are listed, but only abnormal results are displayed) Labs Reviewed  COMPREHENSIVE METABOLIC PANEL - Abnormal; Notable for the following components:      Result Value   Sodium 134 (*)    CO2 20 (*)    Glucose, Bld 143 (*)    BUN 33 (*)    Creatinine, Ser 1.36 (*)    Total Bilirubin 3.4 (*)    GFR, Estimated 60 (*)    All other components within normal limits  CBC - Abnormal; Notable for the following components:   WBC 31.6 (*)    All other components within normal limits  LACTIC ACID, PLASMA - Abnormal; Notable for the following components:   Lactic Acid, Venous 2.7 (*)    All other components within normal limits  CBC WITH DIFFERENTIAL/PLATELET - Abnormal; Notable for the following components:   WBC 32.4 (*)    Neutro Abs 25.3 (*)    Monocytes Absolute 3.2 (*)    Abs Immature Granulocytes 0.26 (*)    All other components within normal limits  GROUP A STREP BY PCR  CULTURE, BLOOD (ROUTINE X 2)  CULTURE, BLOOD (ROUTINE X 2)  RESP PANEL BY RT-PCR (FLU A&B, COVID) ARPGX2  LIPASE, BLOOD  URINALYSIS, ROUTINE W REFLEX MICROSCOPIC  LACTIC ACID, PLASMA  PATHOLOGIST SMEAR REVIEW  TROPONIN I (HIGH SENSITIVITY)  TROPONIN I (HIGH SENSITIVITY)    EKG EKG Interpretation  Date/Time:  Saturday  October 10 2020 12:27:32 EST Ventricular Rate:  136 PR Interval:    QRS Duration: 100 QT Interval:  388 QTC Calculation: 583 R Axis:   0 Text Interpretation: atrial flutter Supraventricular tachycardia Incomplete right bundle branch block Nonspecific ST abnormality Abnormal ECG no STEMI. Confirmed by Charlesetta Shanks [phone]) on 10/10/2020 1:08:17 PM   Radiology DG Chest 2 View  Result Date: 10/10/2020 CLINICAL DATA:  Chest pain. EXAM: CHEST - 2 VIEW COMPARISON:  October 19, 2019 FINDINGS: The heart size and mediastinal contours are within normal limits. Both lungs are clear. The visualized skeletal structures are unremarkable. IMPRESSION: No active cardiopulmonary disease. Electronically Signed   By: Dorise Bullion III M.D   On: 10/10/2020 13:25    Procedures Procedures  CRITICAL CARE Performed by: Charlesetta Shanks   Total critical care time: 33 minutes  Critical care time was exclusive of separately billable procedures and treating other patients.  Critical care was necessary to treat or prevent imminent or life-threatening deterioration.  Critical care was time spent personally by me on the following activities: development of treatment plan with patient and/or surrogate as well as nursing, discussions with consultants, evaluation of patient's response to treatment, examination of patient, obtaining history from patient or surrogate, ordering and performing treatments and interventions, ordering and review of laboratory studies, ordering and review of radiographic studies, pulse oximetry and re-evaluation of patient's condition. Medications Ordered in ED Medications  lactated ringers infusion (has no administration in time range)  vancomycin (VANCOREADY) IVPB 1750 mg/350 mL (has no administration in time range)  vancomycin variable dose per unstable renal function (pharmacist dosing) (has no administration in time range)  morphine 4 MG/ML injection 4 mg (4 mg Intravenous Given 10/10/20  1317)  pantoprazole (PROTONIX) injection 40 mg (40 mg Intravenous Given 10/10/20 1320)  lactated ringers bolus 1,000 mL (1,000 mLs Intravenous New Bag/Given 10/10/20 1347)  ceFEPIme (MAXIPIME) 2 g in sodium chloride 0.9 % 100 mL IVPB (0 g Intravenous Stopped 10/10/20 1502)    ED  Course  I have reviewed the triage vital signs and the nursing notes.  Pertinent labs & imaging results that were available during my care of the patient were reviewed by me and considered in my medical decision making (see chart for details).    MDM Rules/Calculators/A&P                         Patient presents with combination of symptoms.  He has had swelling of the lower lip which is suggestive of infectious or traumatic swelling.  This is indurated and tender.  This seems less likely to be angioedema.  The oral cavity is completely clear without any intraoral swelling.  There is no swelling of the tongue.  Possibly an extension of a dental source.  Patient also is complaining of significant chest pain as well as pain with swallowing, pain in the throat and abdomen.  The airway is widely patent and there is no stridor or visible swelling to the pharynx.  However, with tachycardia significant leukocytosis and moderate lactic acidosis, will started empiric antibiotic therapy.  Plan for CT scan of soft tissues of the neck as well as dissection study of chest and abdomen.  Dr. Ralene Bathe to follow-up on results of CT scans for further diagnostic evaluation.  Final Clinical Impression(s) / ED Diagnoses Final diagnoses:  SIRS (systemic inflammatory response syndrome) (HCC)  Lip swelling    Rx / DC Orders ED Discharge Orders    None       Charlesetta Shanks, MD 10/10/20 1553

## 2020-10-10 NOTE — ED Triage Notes (Signed)
Pt to triage via [facility] from home.  EMS called out for lower lip swelling with bruising.  ? Puncture wound.  Pt also reports nausea, vomiting, throat pain (worse after orange juice), and epigastric pain x 2 days.  Also reports pain across chest and SOB.  22g L hand.  Zofran 4mg  IV.  Hasn't been taking meds for several months.

## 2020-10-10 NOTE — ED Notes (Signed)
Attempted to call report

## 2020-10-10 NOTE — ED Provider Notes (Signed)
Patient care assumed at 1500. Patient here for evaluation of several days of left swelling, one day of chest pain. CBC with significant leukocytosis, concern for infection and he was started on broad-spectrum antibiotics. Imaging pending.  CT abdomen demonstrates findings consistent with cirrhosis. No evidence of serious bacterial infection on imaging. Plan to admit for cellulitis of the lip given his leukocytosis and symptoms. Hospitalist consulted for admission   Tilden Fossa, MD 10/10/20 2103

## 2020-10-10 NOTE — H&P (Signed)
History and Physical    Cory Palmer DPO:[id number] DOB: 03-03-1961 DOA: 10/10/2020  PCP: Patient, [name] Per   Patient coming from: Home   Chief Complaint: lower lip edema and odynophagia.   HPI: Cory Palmer is a 60 y.o. male with medical history significant of bilateral BKA, anxiety, bipolar, T2DM, HTN, who presented with significant lower lip edema and odynophagia.  Patient lives alone, able to do his activities of daily living independently.  About 2 days patient drank orange juice experiencing immediate excruciating burning pain down his throat into his stomach.  Since then he has been experiencing persistent, moderate to severe burning type pain from his throat down to his abdomen, worse with p.o. intake, no improving factors, no associated nausea, or vomiting. He has not been able to take his home medications due to odynophagia.  Today his neighbor went to visit him, he noticed the severe swelling of his lower lip and he called the ambulance.  Patient does not recall any facial/lip trauma no recent falls.    ED Course: Patient was found with severe leukocytosis and significant lower lip edema.  Soft tissue neck CT scan no obstruction.  CT of the abdomen pelvis no significant changes. Patient was referred for further evaluation.  Review of Systems:  1. General: No fevers, no chills, no weight gain or weight loss 2. ENT: No runny nose or sore throat, no hearing disturbances 3. Pulmonary: No dyspnea, cough, wheezing, or hemoptysis 4. Cardiovascular: No angina, claudication, lower extremity edema, pnd or orthopnea 5. Gastrointestinal: No nausea or vomiting, no diarrhea or constipation 6. Hematology: No easy bruisability or frequent infections 7. Urology: No dysuria, hematuria or increased urinary frequency 8. Dermatology: No rashes. 9. Neurology: No seizures or paresthesias 10. Musculoskeletal: No joint pain or deformities  Past Medical History:  Diagnosis Date  . Anxiety    . Depressed bipolar disorder ([place])   . Diabetes mellitus without complication ([place])   . Hypertension   . Osteomyelitis of left foot ([place]) 10/19/2019  . Osteomyelitis of toe of left foot ([place])   . S/P transmetatarsal amputation of foot, left ([place]) 09/28/2018    Past Surgical History:  Procedure Laterality Date  . AMPUTATION Left 09/28/2018   Procedure: LEFT TRANSMETATARSAL AMPUTATION;  Surgeon: Newt Minion, MD;  Location: [place];  Service: Orthopedics;  Laterality: Left;  . AMPUTATION Left 10/23/2019   Procedure: AMPUTATION BELOW KNEE;  Surgeon: Newt Minion, MD;  Location: [place];  Service: Orthopedics;  Laterality: Left;  . BELOW KNEE LEG AMPUTATION Right   . TEE WITHOUT CARDIOVERSION N/A 10/25/2019   Procedure: TRANSESOPHAGEAL ECHOCARDIOGRAM (TEE);  Surgeon: Lelon Perla, MD;  Location: [facility] ENDOSCOPY;  Service: Cardiovascular;  Laterality: N/A;     reports that he has been smoking cigarettes. He has never used smokeless tobacco. He reports previous alcohol use. He reports previous drug use. Drug: Cocaine.  No Known Allergies  Family History  Problem Relation Age of Onset  . Hypertension Other   . Diabetes Mellitus II Other      Prior to Admission medications   Medication Sig Start Date End Date Taking? Authorizing Provider  Blood Glucose Monitoring Suppl (ACCU-CHEK AVIVA PLUS) w/Device KIT  05/23/19   [provider]  doxycycline (VIBRA-TABS) 100 MG tablet Take 1 tablet (100 mg total) by mouth 2 (two) times daily. 12/09/19   Persons, Bevely Palmer, PA  ferrous sulfate 325 (65 FE) MG tablet Take 1 tablet (325 mg total) by mouth daily with  breakfast. 11/06/19   Dessa Phi, DO  metFORMIN (GLUCOPHAGE) 1000 MG tablet Take 1 tablet (1,000 mg total) by mouth daily. 10/21/19   Murlean Iba, MD  Multiple Vitamin (MULTIVITAMIN WITH MINERALS) TABS tablet Take 1 tablet by mouth daily. 05/11/18   Danford, Suann Larry, MD  oxyCODONE-acetaminophen (PERCOCET/ROXICET) 5-325 MG  tablet Take 1 tablet by mouth every 4 (four) hours as needed for severe pain. 12/26/19   Newt Minion, MD  QUEtiapine (SEROQUEL) 300 MG tablet Take 300 mg by mouth at bedtime. 08/01/19   [provider]  sertraline (ZOLOFT) 50 MG tablet Take 50 mg by mouth daily. 08/01/19   [provider]  traZODone (DESYREL) 150 MG tablet Take 150 mg by mouth at bedtime.    [provider]    Physical Exam: Vitals:   10/10/20 1215 10/10/20 1315 10/10/20 1448 10/10/20 1600  BP: (!) 147/69 107/68 104/63 105/77  Pulse: (!) 112 98 (!) 103 (!) 109  Resp: (!) 24 (!) 25 (!) 22 17  Temp: 97.9 F (36.6 C)     SpO2: 100% 100% 99% 97%    Vitals:   10/10/20 1215 10/10/20 1315 10/10/20 1448 10/10/20 1600  BP: (!) 147/69 107/68 104/63 105/77  Pulse: (!) 112 98 (!) 103 (!) 109  Resp: (!) 24 (!) 25 (!) 22 17  Temp: 97.9 F (36.6 C)     SpO2: 100% 100% 99% 97%   General: deconditioned, positive anxiety.  Neurology: Awake and alert, non focal Head and Neck. Head normocephalic. Neck supple with no adenopathy or thyromegaly.  Lower lip with significant edema, tongue with no edema, no oral lesions or ulcers, no gingival hyperplasia or discoloration   E ENT: no pallor, no icterus, oral mucosa moist Cardiovascular: No JVD. S1-S2 present, rhythmic, no gallops, rubs, or murmurs. No lower extremity edema. Pulmonary: positive breath sounds bilaterally, adequate air movement, no wheezing, rhonchi or rales. Gastrointestinal. Abdomen soft and non tender.  Skin. No rashes Musculoskeletal: bilateral BKA     Labs on Admission: I have personally reviewed following labs and imaging studies  CBC: Recent Labs  Lab 10/10/20 1226 10/10/20 1335  WBC 31.6* 32.4*  NEUTROABS  --  25.3*  HGB 14.9 14.6  HCT 43.6 41.2  MCV 95.2 92.8  PLT 203 101   Basic Metabolic Panel: Recent Labs  Lab 10/10/20 1226  NA 134*  K 4.1  CL 100  CO2 20*  GLUCOSE 143*  BUN 33*  CREATININE 1.36*  CALCIUM 9.4    GFR: CrCl cannot be calculated (Unknown ideal weight.). Liver Function Tests: Recent Labs  Lab 10/10/20 1226  AST 34  ALT 20  ALKPHOS 86  BILITOT 3.4*  PROT 7.4  ALBUMIN 3.9   Recent Labs  Lab 10/10/20 1226  LIPASE 28   No results for input(s): AMMONIA in the last 168 hours. Coagulation Profile: No results for input(s): INR, PROTIME in the last 168 hours. Cardiac Enzymes: No results for input(s): CKTOTAL, CKMB, CKMBINDEX, TROPONINI in the last 168 hours. BNP (last 3 results) No results for input(s): PROBNP in the last 8760 hours. HbA1C: No results for input(s): HGBA1C in the last 72 hours. CBG: No results for input(s): GLUCAP in the last 168 hours. Lipid Profile: No results for input(s): CHOL, HDL, LDLCALC, TRIG, CHOLHDL, LDLDIRECT in the last 72 hours. Thyroid Function Tests: No results for input(s): TSH, T4TOTAL, FREET4, T3FREE, THYROIDAB in the last 72 hours. Anemia Panel: No results for input(s): VITAMINB12, FOLATE, FERRITIN, TIBC, IRON, RETICCTPCT in  the last 72 hours. Urine analysis:    Component Value Date/Time   COLORURINE YELLOW 11/19/2019 1830   APPEARANCEUR CLEAR 11/19/2019 1830   LABSPEC 1.015 11/19/2019 1830   PHURINE 6.0 11/19/2019 1830   GLUCOSEU 50 (A) 11/19/2019 1830   HGBUR NEGATIVE 11/19/2019 1830   BILIRUBINUR NEGATIVE 11/19/2019 1830   KETONESUR NEGATIVE 11/19/2019 1830   PROTEINUR NEGATIVE 11/19/2019 1830   NITRITE NEGATIVE 11/19/2019 1830   LEUKOCYTESUR NEGATIVE 11/19/2019 1830    Radiological Exams on Admission: DG Chest 2 View  Result Date: 10/10/2020 CLINICAL DATA:  Chest pain. EXAM: CHEST - 2 VIEW COMPARISON:  October 19, 2019 FINDINGS: The heart size and mediastinal contours are within normal limits. Both lungs are clear. The visualized skeletal structures are unremarkable. IMPRESSION: No active cardiopulmonary disease. Electronically Signed   By: Dorise Bullion III M.D   On: 10/10/2020 13:25   CT Soft Tissue Neck W  Contrast  Result Date: 10/10/2020 CLINICAL DATA:  Neck mass. Lower lip swelling and bruising. Possible puncture wound. Nausea, vomiting, and throat pain. EXAM: CT NECK WITH CONTRAST TECHNIQUE: Multidetector CT imaging of the neck was performed using the standard protocol following the bolus administration of intravenous contrast. CONTRAST:  162m OMNIPAQUE IOHEXOL 350 MG/ML SOLN COMPARISON:  CTs of the cervical spine and orbits 05/02/2020. FINDINGS: Pharynx and larynx: No gross mass or swelling is identified, however there is extensive motion artifact through the oral cavity and oropharynx. The airway is widely patent. There is no retropharyngeal fluid collection. Salivary glands: No inflammation, mass, or stone. Thyroid: Unremarkable. Lymph nodes: No enlarged or suspicious lymph nodes in the neck. Vascular: Moderate calcified plaque at the left greater than right carotid bifurcations. Likely significant stenosis of the proximal ICA with assessment limited by motion artifact and non angiographic technique. Limited intracranial: Unremarkable. Visualized orbits: Unremarkable. Mastoids and visualized paranasal sinuses: Clear. Skeleton: Absence of the maxillary and majority of the mandibular dentition. Moderate cervical spondylosis. Upper chest: Report separately. Other: Diffuse soft tissue swelling of the lower lip. No discrete mass or fluid collection identified within limitations of motion artifact. IMPRESSION: 1. Motion degraded examination. 2. Nonspecific lower lip soft tissue swelling. 3. Carotid atherosclerosis with likely significant proximal left ICA stenosis. Consider carotid Doppler ultrasound for further evaluation. Electronically Signed   By: ALogan BoresM.D.   On: 10/10/2020 16:07   CT Angio Chest/Abd/Pel for Dissection W and/or W/WO  Result Date: 10/10/2020 CLINICAL DATA:  Abdominal pain, aortic dissection suspected EXAM: CT ANGIOGRAPHY CHEST, ABDOMEN AND PELVIS TECHNIQUE: Non-contrast CT of the  chest was initially obtained. Multidetector CT imaging through the chest, abdomen and pelvis was performed using the standard protocol during bolus administration of intravenous contrast. Multiplanar reconstructed images and MIPs were obtained and reviewed to evaluate the vascular anatomy. CONTRAST:  102mOMNIPAQUE IOHEXOL 350 MG/ML SOLN COMPARISON:  None. FINDINGS: CTA CHEST FINDINGS Cardiovascular: Preferential opacification of the thoracic aorta. Normal contour and caliber of the thoracic aorta. No evidence of aneurysm, dissection or other aortic pathology. Incidental note of bovine type 2 vessel branching pattern aortic arch. Normal heart size. No pericardial effusion. Three-vessel coronary artery calcifications. Mediastinum/Nodes: No enlarged mediastinal, hilar, or axillary lymph nodes. There is diffuse circumferential thickening of the lower half of the esophagus (series 5, image 83, series 4, image 114). Thyroid gland and trachea demonstrate no significant findings. Lungs/Pleura: Lungs are clear. No pleural effusion or pneumothorax. Musculoskeletal: No chest wall abnormality. No acute or significant osseous findings. Review of the MIP images confirms the above  findings. CTA ABDOMEN AND PELVIS FINDINGS VASCULAR Normal contour and caliber of the abdominal aorta. No evidence of aneurysm, dissection or other acute aortic pathology. 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 Solitary left renal artery. Otherwise standard branching pattern of the abdominal aorta. The branch vessel origins are patent. Mild aortic atherosclerosis. Review of the MIP images confirms the above findings. NON-VASCULAR Hepatobiliary: Grossly cirrhotic morphology of the liver. No solid liver abnormality is seen. No gallstones, gallbladder wall thickening, or biliary dilatation. Pancreas: Unremarkable. No pancreatic ductal dilatation or surrounding inflammatory changes. Spleen: Splenomegaly, maximum coronal span 18.6 cm.  Adrenals/Urinary Tract: Adrenal glands are unremarkable. Kidneys are normal, without renal calculi, solid lesion, or hydronephrosis. Bladder is unremarkable. Stomach/Bowel: Gastroesophageal varices. Appendix appears normal. No evidence of bowel wall thickening, distention, or inflammatory changes. Lymphatic: No enlarged abdominal or pelvic lymph nodes. Reproductive: No mass or other significant abnormality. Other: No abdominal wall hernia or abnormality. No abdominopelvic ascites. Musculoskeletal: Chronic bilateral pars defects and degenerative listhesis of L4 on L5. Review of the MIP images confirms the above findings. IMPRESSION: 1. Normal contour and caliber of the thoracic and abdominal aorta. No evidence of aneurysm, dissection or other acute aortic pathology. Mild aortic atherosclerosis. 2. There is diffuse circumferential thickening of the lower half of the esophagus, which may be related to reflux esophagitis. Malignancy is however difficult to exclude. 3. Grossly cirrhotic morphology of the liver with stigmata of portal hypertension including splenomegaly and gastroesophageal varices. 4. Coronary artery disease. Aortic Atherosclerosis (ICD10-I70.0). Electronically Signed   By: Eddie Candle M.D.   On: 10/10/2020 16:08    EKG: Independently reviewed.  136 bpm, left axis deviation, normal intervals, QTC 484, sinus rhythm, no significant ST segment or T wave changes.  Assessment/Plan Principal Problem:   Leukocytosis Active Problems:   Diabetes mellitus type II, non insulin dependent (HCC)   Depressed bipolar disorder ([place])   Hypertension   Below-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)   Cirrhosis of liver (HCC)   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HCC)   GERD (gastroesophageal reflux disease)   60 year old male with multiple medical problems who presents with severe odynophagia and lower lip edema, ongoing symptoms for the last 48 hours, coincidental after drinking orange  juice at home.  On his initial physical examination he looks anxious, his blood pressure is 105/77, heart rate is 109, respiratory rate 17, oxygen saturation 97%, his lungs are clear to au5. scultation, heart S1-S2, present, tachycardic, soft abdomen, no lower extremity edema, no skin rashes, bilateral TKA, lower lip edema.   Sodium 134, potassium 4.1, chloride 100, bicarb 22 glucose 143, BUN 33, creatinine 1.36, lipase 28, AST 34, ALT 20, troponin I 10-12, lactic acid 2.7-3.3, white count 31.6, hemoglobin 14.9, hematocrit 43.6, platelets 23. SARS COVID-19 negative.  Soft tissue neck CT with nonspecific lower lip soft tissue edema.  Incidental carotid atherosclerosis with significant proximal left ICA stenosis to consider ultrasonography.  CT chest/abdomen/pelvis, no evidence of aortic aneurysm or dissection.  Diffuse circumferential thickening of the lower half of the esophagus.  Positive liver cirrhosis with stigmata of portal hypertension with splenomegaly and gastroesophageal ulcers.   Chest radiograph no infiltrates.  Mr. Siever was admitted to the hospital working diagnosis of severe esophagitis complicated with lower lip edema and leukocytosis.  1.  Severe esophagitis.  Patient will be admitted to the medical ward, continue antiacid therapy with sucralfate 4 times daily and twice daily IV pantoprazole 40 mg daily. Clear liquid diet for now and advance  as tolerated. As needed IV analgesics and antiemetics.  If no improvement with medical therapy she may need diagnostic endoscopy. His leukocytosis and elevated lactic acid seems to be reactive, no signs of bacterial infection, will hold on antibiotic therapy for now.  2.  Acute kidney injury.  Likely prerenal, continue hydration with isotonic saline at 100 mill per hour. Follow-up kidney function in the morning, avoid hypotension and nephrotoxic agents.  3.  Type 2 diabetes mellitus.  Hold on Metformin, continue glucose coverage and  monitoring with insulin sliding scale.  4.  Iron deficiency anemia.  Cell count remained stable, continue oral iron supplements.  5, bipolar/depression.  Continue Seroquel, sertraline and trazodone.   DVT prophylaxis: Enoxaparin   Code Status:   full  Family Communication:  No family at the bedside     Consults called:  None   Admission status:  Observation.    Yacqub Baston Gerome Apley MD [facility]   10/10/2020, 5:00 PM

## 2020-10-11 DIAGNOSIS — D696 Thrombocytopenia, unspecified: Secondary | ICD-10-CM | POA: Diagnosis not present

## 2020-10-11 DIAGNOSIS — T380X5A Adverse effect of glucocorticoids and synthetic analogues, initial encounter: Secondary | ICD-10-CM | POA: Diagnosis present

## 2020-10-11 DIAGNOSIS — F3130 Bipolar disorder, current episode depressed, mild or moderate severity, unspecified: Secondary | ICD-10-CM | POA: Diagnosis present

## 2020-10-11 DIAGNOSIS — R1310 Dysphagia, unspecified: Secondary | ICD-10-CM

## 2020-10-11 DIAGNOSIS — K2970 Gastritis, unspecified, without bleeding: Secondary | ICD-10-CM | POA: Diagnosis present

## 2020-10-11 DIAGNOSIS — Z8249 Family history of ischemic heart disease and other diseases of the circulatory system: Secondary | ICD-10-CM | POA: Diagnosis not present

## 2020-10-11 DIAGNOSIS — I10 Essential (primary) hypertension: Secondary | ICD-10-CM | POA: Diagnosis present

## 2020-10-11 DIAGNOSIS — K254 Chronic or unspecified gastric ulcer with hemorrhage: Secondary | ICD-10-CM | POA: Diagnosis present

## 2020-10-11 DIAGNOSIS — F17210 Nicotine dependence, cigarettes, uncomplicated: Secondary | ICD-10-CM | POA: Diagnosis present

## 2020-10-11 DIAGNOSIS — E872 Acidosis: Secondary | ICD-10-CM | POA: Diagnosis present

## 2020-10-11 DIAGNOSIS — S88112A Complete traumatic amputation at level between knee and ankle, left lower leg, initial encounter: Secondary | ICD-10-CM | POA: Diagnosis not present

## 2020-10-11 DIAGNOSIS — K2101 Gastro-esophageal reflux disease with esophagitis, with bleeding: Secondary | ICD-10-CM | POA: Diagnosis present

## 2020-10-11 DIAGNOSIS — S88111A Complete traumatic amputation at level between knee and ankle, right lower leg, initial encounter: Secondary | ICD-10-CM | POA: Diagnosis not present

## 2020-10-11 DIAGNOSIS — K5900 Constipation, unspecified: Secondary | ICD-10-CM | POA: Diagnosis present

## 2020-10-11 DIAGNOSIS — E876 Hypokalemia: Secondary | ICD-10-CM | POA: Diagnosis not present

## 2020-10-11 DIAGNOSIS — D72829 Elevated white blood cell count, unspecified: Secondary | ICD-10-CM

## 2020-10-11 DIAGNOSIS — K7460 Unspecified cirrhosis of liver: Secondary | ICD-10-CM | POA: Diagnosis present

## 2020-10-11 DIAGNOSIS — F419 Anxiety disorder, unspecified: Secondary | ICD-10-CM | POA: Diagnosis present

## 2020-10-11 DIAGNOSIS — E1165 Type 2 diabetes mellitus with hyperglycemia: Secondary | ICD-10-CM | POA: Diagnosis present

## 2020-10-11 DIAGNOSIS — E871 Hypo-osmolality and hyponatremia: Secondary | ICD-10-CM | POA: Diagnosis present

## 2020-10-11 DIAGNOSIS — K209 Esophagitis, unspecified without bleeding: Secondary | ICD-10-CM | POA: Diagnosis not present

## 2020-10-11 DIAGNOSIS — N179 Acute kidney failure, unspecified: Secondary | ICD-10-CM | POA: Diagnosis present

## 2020-10-11 DIAGNOSIS — Z89512 Acquired absence of left leg below knee: Secondary | ICD-10-CM | POA: Diagnosis not present

## 2020-10-11 DIAGNOSIS — Z20822 Contact with and (suspected) exposure to covid-19: Secondary | ICD-10-CM | POA: Diagnosis present

## 2020-10-11 DIAGNOSIS — K766 Portal hypertension: Secondary | ICD-10-CM | POA: Diagnosis present

## 2020-10-11 DIAGNOSIS — Z833 Family history of diabetes mellitus: Secondary | ICD-10-CM | POA: Diagnosis not present

## 2020-10-11 DIAGNOSIS — Z89511 Acquired absence of right leg below knee: Secondary | ICD-10-CM | POA: Diagnosis not present

## 2020-10-11 LAB — BLOOD CULTURE ID PANEL (REFLEXED) - BCID2

## 2020-10-11 LAB — URINALYSIS, ROUTINE W REFLEX MICROSCOPIC
Bilirubin Urine: NEGATIVE
Glucose, UA: NEGATIVE mg/dL
Hgb urine dipstick: NEGATIVE
Ketones, ur: NEGATIVE mg/dL
Leukocytes,Ua: NEGATIVE
Nitrite: NEGATIVE
Protein, ur: NEGATIVE mg/dL
Specific Gravity, Urine: 1.024 (ref 1.005–1.030)
pH: 5 (ref 5.0–8.0)

## 2020-10-11 LAB — CBC
HCT: 34.1 % — ABNORMAL LOW (ref 39.0–52.0)
Hemoglobin: 11.9 g/dL — ABNORMAL LOW (ref 13.0–17.0)
MCH: 33 pg (ref 26.0–34.0)
MCHC: 34.9 g/dL (ref 30.0–36.0)
MCV: 94.5 fL (ref 80.0–100.0)
Platelets: 89 10*3/uL — ABNORMAL LOW (ref 150–400)
RBC: 3.61 MIL/uL — ABNORMAL LOW (ref 4.22–5.81)
RDW: 14.5 % (ref 11.5–15.5)
WBC: 15.7 10*3/uL — ABNORMAL HIGH (ref 4.0–10.5)
nRBC: 0 % (ref 0.0–0.2)

## 2020-10-11 LAB — BASIC METABOLIC PANEL
Anion gap: 8 (ref 5–15)
BUN: 28 mg/dL — ABNORMAL HIGH (ref 6–20)
CO2: 20 mmol/L — ABNORMAL LOW (ref 22–32)
Calcium: 8.3 mg/dL — ABNORMAL LOW (ref 8.9–10.3)
Chloride: 105 mmol/L (ref 98–111)
Creatinine, Ser: 0.94 mg/dL (ref 0.61–1.24)
GFR, Estimated: >60 mL/min (ref >60)
Glucose, Bld: 161 mg/dL — ABNORMAL HIGH (ref 70–99)
Potassium: 3.6 mmol/L (ref 3.5–5.1)
Sodium: 133 mmol/L — ABNORMAL LOW (ref 135–145)

## 2020-10-11 LAB — GLUCOSE, CAPILLARY
Glucose-Capillary: 303 mg/dL — ABNORMAL HIGH (ref 70–99)
Glucose-Capillary: 147 mg/dL — ABNORMAL HIGH (ref 70–99)
Glucose-Capillary: 146 mg/dL — ABNORMAL HIGH (ref 70–99)
Glucose-Capillary: 147 mg/dL — ABNORMAL HIGH (ref 70–99)

## 2020-10-11 LAB — PROTIME-INR
INR: 1.4 — ABNORMAL HIGH (ref 0.8–1.2)
Prothrombin Time: 16.5 seconds — ABNORMAL HIGH (ref 11.4–15.2)

## 2020-10-11 MED ORDER — SODIUM CHLORIDE 0.9 % IV SOLN
2.0000 g | INTRAVENOUS | Status: AC
  Administered 2020-10-11 – 2020-10-15 (×5): 2 g via INTRAVENOUS
  Filled 2020-10-11 (×5): qty 2

## 2020-10-11 NOTE — Progress Notes (Signed)
PROGRESS NOTE    Cory Palmer  KVQ:[id number] DOB: [birth year] DOA: 10/10/2020 PCP: Patient, [name] Per   Brief Narrative:  This 60 years old male with PMH significant for bilateral BKA, anxiety disorder, bipolar, type II DM, hypertension presents in the ED with significant lower lip edema and odynophagia(pain while swallowing).  Patient lives alone, is able to do his activities of daily living independently.  Patient reports having excruciating burning pain in his throat radiating towards the stomach after drinking orange juice 2 days ago since then he has been experiencing persistent moderate to severe burning pain that gets worse with swallowing.  Patient also reports he has not been able to take his home medication because of the pain.  EMS was called by his neighbor when he went to see him and found him with severe lip swelling.  Patient denies any fall or trauma to the lip or face.  Soft tissue CT neck showed no obstruction,  CT abdomen pelvis without any significant changes.  Patient is found to have severe leukocytosis which could be reactive.  GI consulted, scheduled to have EGD tomorrow morning.  Assessment & Plan:   Principal Problem:   Leukocytosis Active Problems:   Diabetes mellitus type II, non insulin dependent (HCC)   Depressed bipolar disorder (HCC)   Hypertension   Below-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)   Cirrhosis of liver (HCC)   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HCC)   GERD (gastroesophageal reflux disease)   Odynophagia due to severe esophagitis: Patient reportS severe burning pain in the throat radiating towards the stomach after drinking orange juice 2 days ago. He is not able to take his medication because of the pain while swallowing Continue antiacid therapy with sucralfate 4 times daily and twice daily IV pantoprazole 40 mg daily. Clear liquid diet for now and advance as tolerated. Antiemetic and analgesics iv as needed. GI  consulted, recommended EGD to find out severe esophagitis.  Leukocytosis /lactic acidosis : His leukocytosis and elevated lactic acid seems to be reactive,  There are no signs of bacterial infection, antibiotics were held. Blood culture grew Streptococcus, started on ceftriaxone 2 g daily. Monitor WBC trend.  Acute kidney injury> Resolved. Likely prerenal, continue hydration with isotonic saline at 100 mill per hour. Follow-up kidney function in the morning, avoid hypotension and nephrotoxic agents.  Type 2 diabetes mellitus.  Hold on Metformin, continue glucose coverage and monitoring with insulin sliding scale.  Iron deficiency anemia.  Cell count remained stable, continue oral iron supplements.  Bipolar/depression.  Continue Seroquel, sertraline and trazodone.   DVT prophylaxis: Lovenox Code Status: Full code Family Communication: No family at bedside Disposition Plan:   Status is: Inpatient  Remains inpatient appropriate because:Inpatient level of care appropriate due to severity of illness   Dispo: The patient is from: Home              Anticipated d/c is to: TBD              Patient currently is not medically stable to d/c.   Difficult to place patient No   Consultants:   Gastroenterology  Procedures: Schedule EGD tomorrow morning  Antimicrobials:    Anti-infectives (From admission, onward)   Start     Dose/Rate Route Frequency Ordered Stop   10/11/20 1400  cefTRIAXone (ROCEPHIN) 2 g in sodium chloride 0.9 % 100 mL IVPB        2 g 200 mL/hr over 30 Minutes Intravenous Every 24  hours 10/11/20 1233     10/10/20 2055  vancomycin variable dose per unstable renal function (pharmacist dosing)  Status:  Discontinued         Does not apply See admin instructions 10/10/20 2055 10/11/20 1115   10/10/20 1515  vancomycin variable dose per unstable renal function (pharmacist dosing)  Status:  Discontinued         Does not apply See admin instructions 10/10/20 1515  10/10/20 1816   10/10/20 1445  ceFEPIme (MAXIPIME) 2 g in sodium chloride 0.9 % 100 mL IVPB        2 g 200 mL/hr over 30 Minutes Intravenous  Once 10/10/20 1435 10/10/20 1502   10/10/20 1445  vancomycin (VANCOREADY) IVPB 1750 mg/350 mL        1,750 mg 175 mL/hr over 120 Minutes Intravenous  Once 10/10/20 1444 10/10/20 1819         Subjective: Patient was seen and examined at bedside.  Overnight events noted.   Patient reports having pain while swallowing.  Denies any other complaints.  Objective: Vitals:   10/10/20 1822 10/10/20 2005 10/11/20 0409 10/11/20 1531  BP: (!) 126/102 110/75 91/61 108/71  Pulse: (!) 109 (!) 107 98 64  Resp: 19 18 18 18   Temp: 98.6 F (37 C) 98.4 F (36.9 C) 98.3 F (36.8 C) 97.7 F (36.5 C)  TempSrc:  Oral Oral Oral  SpO2: 96% 97% 97% 100%    Intake/Output Summary (Last 24 hours) at 10/11/2020 1554 Last data filed at 10/11/2020 1500 Gross per 24 hour  Intake 2980 ml  Output 2350 ml  Net 630 ml   There were no vitals filed for this visit.  Examination:  General exam: Appears calm and comfortable, not in acute distress. Respiratory system: Clear to auscultation. Respiratory effort normal. Cardiovascular system: S1 & S2 heard, RRR. No JVD, murmurs, rubs, gallops or clicks. No pedal edema. Gastrointestinal system: Abdomen is nondistended, soft and nontender. No organomegaly or masses felt. Normal bowel sounds heard. Central nervous system: Alert and oriented. No focal neurological deficits. Extremities: Bilateral BKA. Skin: No rashes, lesions or ulcers Psychiatry: Judgement and insight appear normal. Mood & affect appropriate.     Data Reviewed: I have personally reviewed following labs and imaging studies  CBC: Recent Labs  Lab 10/10/20 1226 10/10/20 1335 10/11/20 0357  WBC 31.6* 32.4* 15.7*  NEUTROABS  --  25.3*  --   HGB 14.9 14.6 11.9*  HCT 43.6 41.2 34.1*  MCV 95.2 92.8 94.5  PLT 203 184 89*   Basic Metabolic Panel: Recent  Labs  Lab 10/10/20 1226 10/11/20 0357  NA 134* 133*  K 4.1 3.6  CL 100 105  CO2 20* 20*  GLUCOSE 143* 161*  BUN 33* 28*  CREATININE 1.36* 0.94  CALCIUM 9.4 8.3*   GFR: CrCl cannot be calculated (Unknown ideal weight.). Liver Function Tests: Recent Labs  Lab 10/10/20 1226  AST 34  ALT 20  ALKPHOS 86  BILITOT 3.4*  PROT 7.4  ALBUMIN 3.9   Recent Labs  Lab 10/10/20 1226  LIPASE 28   No results for input(s): AMMONIA in the last 168 hours. Coagulation Profile: Recent Labs  Lab 10/11/20 1453  INR 1.4*   Cardiac Enzymes: No results for input(s): CKTOTAL, CKMB, CKMBINDEX, TROPONINI in the last 168 hours. BNP (last 3 results) No results for input(s): PROBNP in the last 8760 hours. HbA1C: Recent Labs    10/10/20 1335  HGBA1C 6.6*   CBG: Recent Labs  Lab 10/10/20  2004 10/11/20 0903 10/11/20 1235  GLUCAP 189* 303* 147*   Lipid Profile: No results for input(s): CHOL, HDL, LDLCALC, TRIG, CHOLHDL, LDLDIRECT in the last 72 hours. Thyroid Function Tests: No results for input(s): TSH, T4TOTAL, FREET4, T3FREE, THYROIDAB in the last 72 hours. Anemia Panel: No results for input(s): VITAMINB12, FOLATE, FERRITIN, TIBC, IRON, RETICCTPCT in the last 72 hours. Sepsis Labs: Recent Labs  Lab 10/10/20 1335 10/10/20 1601  LATICACIDVEN 2.7* 3.3*    Recent Results (from the past 240 hour(s))  Culture, blood (routine x 2)     Status: None (Preliminary result)   Collection Time: 10/10/20  1:49 PM   Specimen: BLOOD  Result Value Ref Range Status   Specimen Description BLOOD SITE NOT SPECIFIED  Final   Special Requests   Final    BOTTLES DRAWN AEROBIC AND ANAEROBIC Blood Culture adequate volume   Culture  Setup Time   Final    GRAM POSITIVE COCCI IN CHAINS AEROBIC BOTTLE ONLY CRITICAL RESULT CALLED TO, READ BACK BY AND VERIFIED WITH: PHARMD G.BARR AT 1222 ON 10/11/2020 BY T.SAAD Performed at [facility], [address], [place], [place] [address]    Culture GRAM  POSITIVE COCCI IN CHAINS  Final   Report Status PENDING  Incomplete  Blood Culture ID Panel (Reflexed)     Status: Abnormal   Collection Time: 10/10/20  1:49 PM  Result Value Ref Range Status   Enterococcus faecalis NOT DETECTED NOT DETECTED Final   Enterococcus Faecium NOT DETECTED NOT DETECTED Final   Listeria monocytogenes NOT DETECTED NOT DETECTED Final   Staphylococcus species NOT DETECTED NOT DETECTED Final   Staphylococcus aureus (BCID) NOT DETECTED NOT DETECTED Final   Staphylococcus epidermidis NOT DETECTED NOT DETECTED Final   Staphylococcus lugdunensis NOT DETECTED NOT DETECTED Final   Streptococcus species DETECTED (A) NOT DETECTED Final    Comment: Not Enterococcus species, Streptococcus agalactiae, Streptococcus pyogenes, or Streptococcus pneumoniae. CRITICAL RESULT CALLED TO, READ BACK BY AND VERIFIED WITH: PHARMD G.BARR AT 1222 ON 10/11/2020 BY T.SAAD    Streptococcus agalactiae NOT DETECTED NOT DETECTED Final   Streptococcus pneumoniae NOT DETECTED NOT DETECTED Final   Streptococcus pyogenes NOT DETECTED NOT DETECTED Final   A.calcoaceticus-baumannii NOT DETECTED NOT DETECTED Final   Bacteroides fragilis NOT DETECTED NOT DETECTED Final   Enterobacterales NOT DETECTED NOT DETECTED Final   Enterobacter cloacae complex NOT DETECTED NOT DETECTED Final   Escherichia coli NOT DETECTED NOT DETECTED Final   Klebsiella aerogenes NOT DETECTED NOT DETECTED Final   Klebsiella oxytoca NOT DETECTED NOT DETECTED Final   Klebsiella pneumoniae NOT DETECTED NOT DETECTED Final   Proteus species NOT DETECTED NOT DETECTED Final   Salmonella species NOT DETECTED NOT DETECTED Final   Serratia marcescens NOT DETECTED NOT DETECTED Final   Haemophilus influenzae NOT DETECTED NOT DETECTED Final   Neisseria meningitidis NOT DETECTED NOT DETECTED Final   Pseudomonas aeruginosa NOT DETECTED NOT DETECTED Final   Stenotrophomonas maltophilia NOT DETECTED NOT DETECTED Final   Candida albicans NOT  DETECTED NOT DETECTED Final   Candida auris NOT DETECTED NOT DETECTED Final   Candida glabrata NOT DETECTED NOT DETECTED Final   Candida krusei NOT DETECTED NOT DETECTED Final   Candida parapsilosis NOT DETECTED NOT DETECTED Final   Candida tropicalis NOT DETECTED NOT DETECTED Final   Cryptococcus neoformans/gattii NOT DETECTED NOT DETECTED Final    Comment: Performed at [facility], [address], [place], [place] [address]  Resp Panel by RT-PCR (Flu A&B, Covid)  Nasopharyngeal Swab     Status: None   Collection Time: 10/10/20  2:58 PM   Specimen: Nasopharyngeal Swab; Nasopharyngeal(NP) swabs in vial transport medium  Result Value Ref Range Status   SARS Coronavirus 2 by RT PCR NEGATIVE NEGATIVE Final    Comment: (NOTE) SARS-CoV-2 target nucleic acids are NOT DETECTED.  The SARS-CoV-2 RNA is generally detectable in upper respiratory specimens during the acute phase of infection. The lowest concentration of SARS-CoV-2 viral copies this assay can detect is 138 copies/mL. A negative result does not preclude SARS-Cov-2 infection and should not be used as the sole basis for treatment or other patient management decisions. A negative result may occur with  improper specimen collection/handling, submission of specimen other than nasopharyngeal swab, presence of viral mutation(s) within the areas targeted by this assay, and inadequate number of viral copies(<138 copies/mL). A negative result must be combined with clinical observations, patient history, and epidemiological information. The expected result is Negative.  Fact Sheet for Patients:  BloggerCourse.com  Fact Sheet for Healthcare Providers:  SeriousBroker.it  This test is no t yet approved or cleared by the [place] FDA and  has been authorized for detection and/or diagnosis of SARS-CoV-2 by FDA under an Emergency Use Authorization (EUA). This EUA will remain  in  effect (meaning this test can be used) for the duration of the COVID-19 declaration under Section 564(b)(1) of the Act, 21 U.S.C.section 360bbb-3(b)(1), unless the authorization is terminated  or revoked sooner.       Influenza A by PCR NEGATIVE NEGATIVE Final   Influenza B by PCR NEGATIVE NEGATIVE Final    Comment: (NOTE) The Xpert Xpress SARS-CoV-2/FLU/RSV plus assay is intended as an aid in the diagnosis of influenza from Nasopharyngeal swab specimens and should not be used as a sole basis for treatment. Nasal washings and aspirates are unacceptable for Xpert Xpress SARS-CoV-2/FLU/RSV testing.  Fact Sheet for Patients: BloggerCourse.com  Fact Sheet for Healthcare Providers: SeriousBroker.it  This test is not yet approved or cleared by the [place] FDA and has been authorized for detection and/or diagnosis of SARS-CoV-2 by FDA under an Emergency Use Authorization (EUA). This EUA will remain in effect (meaning this test can be used) for the duration of the COVID-19 declaration under Section 564(b)(1) of the Act, 21 U.S.C. section 360bbb-3(b)(1), unless the authorization is terminated or revoked.  Performed at [facility], [address], [place], [place] [address]   Group A Strep by PCR     Status: None   Collection Time: 10/10/20  2:58 PM   Specimen: Nasopharyngeal Swab; Sterile Swab  Result Value Ref Range Status   Group A Strep by PCR NOT DETECTED NOT DETECTED Final    Comment: Performed at [facility], [address] [place], [place] [address]    Radiology Studies: DG Chest 2 View  Result Date: 10/10/2020 CLINICAL DATA:  Chest pain. EXAM: CHEST - 2 VIEW COMPARISON:  October 19, 2019 FINDINGS: The heart size and mediastinal contours are within normal limits. Both lungs are clear. The visualized skeletal structures are unremarkable. IMPRESSION: No active cardiopulmonary disease. Electronically Signed    By: Gerome Sam III M.D   On: 10/10/2020 13:25   CT Soft Tissue Neck W Contrast  Result Date: 10/10/2020 CLINICAL DATA:  Neck mass. Lower lip swelling and bruising. Possible puncture wound. Nausea, vomiting, and throat pain. EXAM: CT NECK WITH CONTRAST TECHNIQUE: Multidetector CT imaging of the neck was performed using the standard protocol following the  bolus administration of intravenous contrast. CONTRAST:  OMNIPAQUE IOHEXOL 350 MG/ML SOLN COMPARISON:  CTs of the cervical spine and orbits 05/02/2020. FINDINGS: Pharynx and larynx: No gross mass or swelling is identified, however there is extensive motion artifact through the oral cavity and oropharynx. The airway is widely patent. There is no retropharyngeal fluid collection. Salivary glands: No inflammation, mass, or stone. Thyroid: Unremarkable. Lymph nodes: No enlarged or suspicious lymph nodes in the neck. Vascular: Moderate calcified plaque at the left greater than right carotid bifurcations. Likely significant stenosis of the proximal ICA with assessment limited by motion artifact and non angiographic technique. Limited intracranial: Unremarkable. Visualized orbits: Unremarkable. Mastoids and visualized paranasal sinuses: Clear. Skeleton: Absence of the maxillary and majority of the mandibular dentition. Moderate cervical spondylosis. Upper chest: Report separately. Other: Diffuse soft tissue swelling of the lower lip. No discrete mass or fluid collection identified within limitations of motion artifact. IMPRESSION: 1. Motion degraded examination. 2. Nonspecific lower lip soft tissue swelling. 3. Carotid atherosclerosis with likely significant proximal left ICA stenosis. Consider carotid Doppler ultrasound for further evaluation. Electronically Signed   By: Sebastian Ache M.D.   On: 10/10/2020 16:07   CT Angio Chest/Abd/Pel for Dissection W and/or W/WO  Result Date: 10/10/2020 CLINICAL DATA:  Abdominal pain, aortic dissection suspected EXAM:  CT ANGIOGRAPHY CHEST, ABDOMEN AND PELVIS TECHNIQUE: Non-contrast CT of the chest was initially obtained. Multidetector CT imaging through the chest, abdomen and pelvis was performed using the standard protocol during bolus administration of intravenous contrast. Multiplanar reconstructed images and MIPs were obtained and reviewed to evaluate the vascular anatomy. CONTRAST:  OMNIPAQUE IOHEXOL 350 MG/ML SOLN COMPARISON:  None. FINDINGS: CTA CHEST FINDINGS Cardiovascular: Preferential opacification of the thoracic aorta. Normal contour and caliber of the thoracic aorta. No evidence of aneurysm, dissection or other aortic pathology. Incidental note of bovine type 2 vessel branching pattern aortic arch. Normal heart size. No pericardial effusion. Three-vessel coronary artery calcifications. Mediastinum/Nodes: No enlarged mediastinal, hilar, or axillary lymph nodes. There is diffuse circumferential thickening of the lower half of the esophagus (series 5, image 83, series 4, image 114). Thyroid gland and trachea demonstrate no significant findings. Lungs/Pleura: Lungs are clear. No pleural effusion or pneumothorax. Musculoskeletal: No chest wall abnormality. No acute or significant osseous findings. Review of the MIP images confirms the above findings. CTA ABDOMEN AND PELVIS FINDINGS VASCULAR Normal contour and caliber of the abdominal aorta. No evidence of aneurysm, dissection or other acute aortic pathology. 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 Solitary left renal artery. Otherwise standard branching pattern of the abdominal aorta. The branch vessel origins are patent. Mild aortic atherosclerosis. Review of the MIP images confirms the above findings. NON-VASCULAR Hepatobiliary: Grossly cirrhotic morphology of the liver. No solid liver abnormality is seen. No gallstones, gallbladder wall thickening, or biliary dilatation. Pancreas: Unremarkable. No pancreatic ductal dilatation or surrounding  inflammatory changes. Spleen: Splenomegaly, maximum coronal span 18.6 cm. Adrenals/Urinary Tract: Adrenal glands are unremarkable. Kidneys are normal, without renal calculi, solid lesion, or hydronephrosis. Bladder is unremarkable. Stomach/Bowel: Gastroesophageal varices. Appendix appears normal. No evidence of bowel wall thickening, distention, or inflammatory changes. Lymphatic: No enlarged abdominal or pelvic lymph nodes. Reproductive: No mass or other significant abnormality. Other: No abdominal wall hernia or abnormality. No abdominopelvic ascites. Musculoskeletal: Chronic bilateral pars defects and degenerative listhesis of L4 on L5. Review of the MIP images confirms the above findings. IMPRESSION: 1. Normal contour and caliber of the thoracic and abdominal aorta. No evidence of aneurysm, dissection or  other acute aortic pathology. Mild aortic atherosclerosis. 2. There is diffuse circumferential thickening of the lower half of the esophagus, which may be related to reflux esophagitis. Malignancy is however difficult to exclude. 3. Grossly cirrhotic morphology of the liver with stigmata of portal hypertension including splenomegaly and gastroesophageal varices. 4. Coronary artery disease. Aortic Atherosclerosis (ICD10-I70.0). Electronically Signed   By: Lauralyn Primes M.D.   On: 10/10/2020 16:08   Scheduled Meds: . enoxaparin (LOVENOX) injection  40 mg Subcutaneous Q24H  . ferrous sulfate  325 mg Oral Q breakfast  . insulin aspart  0-9 Units Subcutaneous TID WC  . multivitamin with minerals  1 tablet Oral Daily  . pantoprazole (PROTONIX) IV  40 mg Intravenous Q12H  . QUEtiapine  300 mg Oral QHS  . sertraline  50 mg Oral Daily  . sucralfate  1 g Oral TID WC & HS  . traZODone  150 mg Oral QHS   Continuous Infusions: . sodium chloride 100 mL/hr at 10/11/20 0659  . cefTRIAXone (ROCEPHIN)  IV 2 g (10/11/20 1407)     LOS: 0 days    Time spent: 35 mins    Thyra Yinger, MD [facility]   If 7PM-7AM, please contact night-coverage

## 2020-10-11 NOTE — Progress Notes (Signed)
PHARMACY - PHYSICIAN COMMUNICATION CRITICAL VALUE ALERT - BLOOD CULTURE IDENTIFICATION (BCID)  Cory Palmer is an 60 y.o. male who presented to [facility] on 10/10/2020 with a chief complaint of lip swelling  Assessment:  BCx 1/4 bottles growing Strep spp (unidentified on BCID) -likely contributing cause to ongoing lip swelling  Name of physician (or Provider) Contacted: Dr. Lucianne Muss  Current antibiotics: none (previously on vanc)  Changes to prescribed antibiotics recommended:  Ceftriaxone 2g daily   Results for orders placed or performed during the hospital encounter of 10/10/20  Blood Culture ID Panel (Reflexed) (Collected: 10/10/2020  1:49 PM)  Result Value Ref Range   Enterococcus faecalis NOT DETECTED NOT DETECTED   Enterococcus Faecium NOT DETECTED NOT DETECTED   Listeria monocytogenes NOT DETECTED NOT DETECTED   Staphylococcus species NOT DETECTED NOT DETECTED   Staphylococcus aureus (BCID) NOT DETECTED NOT DETECTED   Staphylococcus epidermidis NOT DETECTED NOT DETECTED   Staphylococcus lugdunensis NOT DETECTED NOT DETECTED   Streptococcus species DETECTED (A) NOT DETECTED   Streptococcus agalactiae NOT DETECTED NOT DETECTED   Streptococcus pneumoniae NOT DETECTED NOT DETECTED   Streptococcus pyogenes NOT DETECTED NOT DETECTED   A.calcoaceticus-baumannii NOT DETECTED NOT DETECTED   Bacteroides fragilis NOT DETECTED NOT DETECTED   Enterobacterales NOT DETECTED NOT DETECTED   Enterobacter cloacae complex NOT DETECTED NOT DETECTED   Escherichia coli NOT DETECTED NOT DETECTED   Klebsiella aerogenes NOT DETECTED NOT DETECTED   Klebsiella oxytoca NOT DETECTED NOT DETECTED   Klebsiella pneumoniae NOT DETECTED NOT DETECTED   Proteus species NOT DETECTED NOT DETECTED   Salmonella species NOT DETECTED NOT DETECTED   Serratia marcescens NOT DETECTED NOT DETECTED   Haemophilus influenzae NOT DETECTED NOT DETECTED   Neisseria meningitidis NOT DETECTED NOT DETECTED   Pseudomonas  aeruginosa NOT DETECTED NOT DETECTED   Stenotrophomonas maltophilia NOT DETECTED NOT DETECTED   Candida albicans NOT DETECTED NOT DETECTED   Candida auris NOT DETECTED NOT DETECTED   Candida glabrata NOT DETECTED NOT DETECTED   Candida krusei NOT DETECTED NOT DETECTED   Candida parapsilosis NOT DETECTED NOT DETECTED   Candida tropicalis NOT DETECTED NOT DETECTED   Cryptococcus neoformans/gattii NOT DETECTED NOT DETECTED    Margarite Gouge, PharmD PGY2 ID Pharmacy Resident Phone between 7 am - 3:30 pm: [phone]  Please check AMION for all [facility] Pharm[facility] phone numbers After 10:00 PM, call Main Pharmacy [phone]   10/11/2020  12:34 PM

## 2020-10-11 NOTE — Consult Note (Addendum)
Referring Provider:  [facility]         Primary Care Physician:  Patient, [name] Per Primary Gastroenterologist:  unassigned           We were asked to see this patient for:    Odynophagia and dysphagia            Attending physician's note   I have taken a history, examined the patient and reviewed the chart. I agree with the Advanced Practitioner's note, impression and recommendations.  60 year old male with history of diabetes, osteomyelitis s/p bilateral BKA, chronic pain, anxiety, bipolar disorder, cirrhosis with odynophagia  His lower lip is swollen, patient denies any trauma or accidental ingestion He has circumferential wall thickening in lower esophagus on CTA Will plan for EGD to exclude severe erosive esophagitis, Candida or viral esophagitis Continue clears N.p.o. after midnight Further recommendation based on EGD findings  New diagnosis of cirrhosis,  ?  EtOH/NASH.  Will check viral hepatitis panel to exclude chronic hep C or B No findings of decompensation Check AFP for HCC screening  Severe leukocytosis on admission, unclear etiology Currently on broad-spectrum antibiotics  The risks and benefits as well as alternatives of endoscopic procedure(s) have been discussed and reviewed. All questions answered. The patient agrees to proceed.   The patient was provided an opportunity to ask questions and all were answered. The patient agreed with the plan and demonstrated an understanding of the instructions.  Damaris Hippo , MD [phone]     ASSESSMENT / PLAN:    # 60 yo male with acute onset of odynophagia. Circumferentail wall thickening on lower esophagus seen on CTA. May have viral, candida or severe erosive esophagitis. Doxycycline on home med list and if taken improperly can cause esophageal ulcers.  --Xylocaine prn --Continue BID PPI --EGD tomorrow. The risks and benefits of EGD were discussed and the patient agrees to proceed. --NPO after  MN   # Cirrhosis with portal HTN on CTA. No signs of decompensation --check INR --platelets 89 --Hepatic serologic workup for etiology of cirrhosis can be done outpatient  --HCC: check AFP. No focal liver lesions on CTA abdomen --Varices screening:  Can evaluate at time of EGD tomorrow    # Chronic [place] anemia. Hgb 11.9 which is at baseline for him. Probably anemia of chronic disease based on iron studies one year ago.   # Leukocytosis, WBC 32K yesterday, down to 15 today. Etiology? UA and CXR unremarkable. He is afebrile.     HPI:                                                                                                                             Chief Complaint: problems swallowing  Cory Palmer is a 60 y.o. male with a history of DM, chronic pain, bilateral BKA, anxiety, bipolar, HTN.   Presented to ED yesterday with lip swelling, nausea, vomiting and epigastric pain. Also reported chest pain and SOB.  In ED labs reveal AKI, normal lipase, bilirubin of 3.4, liver tests otherwise normal. Normal troponin. WBC 31K, normal hgb and platelets. CTA chest , abd / pelvis shows diffuse circumferential thickening of lower half of esophagus. Thyroid gland okay. Cirrhotic appearing liver. No gallbladder findings.   Patient doesn't know why his bottom lip is so swollen. He doesn't recalling falling or other trauma. Swelling noticed a few days ago by a neighbor. Also a few days ago patient began had severe pain when he swallowed orange juice. Since then he has pain with swallowing. No dysphagia. No recent antibiotics and doesn't use steroid inhalers. No history of GERD symptoms. He has occasional constipation with hard stools / straining. Sometimes he has small amount of rectal bleeding associated with straining.    PREVIOUS ENDOSCOPIC EVALUATIONS / PERTINENT STUDIES   none   Past Medical History:  Diagnosis Date  . Anxiety   . Depressed bipolar disorder ([place])   . Diabetes mellitus without  complication ([place])   . Hypertension   . Osteomyelitis of left foot ([place]) 10/19/2019  . Osteomyelitis of toe of left foot ([place])   . S/P transmetatarsal amputation of foot, left ([place]) 09/28/2018    Past Surgical History:  Procedure Laterality Date  . AMPUTATION Left 09/28/2018   Procedure: LEFT TRANSMETATARSAL AMPUTATION;  Surgeon: Newt Minion, MD;  Location: [place];  Service: Orthopedics;  Laterality: Left;  . AMPUTATION Left 10/23/2019   Procedure: AMPUTATION BELOW KNEE;  Surgeon: Newt Minion, MD;  Location: [place];  Service: Orthopedics;  Laterality: Left;  . BELOW KNEE LEG AMPUTATION Right   . TEE WITHOUT CARDIOVERSION N/A 10/25/2019   Procedure: TRANSESOPHAGEAL ECHOCARDIOGRAM (TEE);  Surgeon: Lelon Perla, MD;  Location: [facility] ENDOSCOPY;  Service: Cardiovascular;  Laterality: N/A;    Prior to Admission medications   Medication Sig Start Date End Date Taking? Authorizing Provider  QUEtiapine (SEROQUEL) 300 MG tablet Take 300 mg by mouth at bedtime. 08/01/19  Yes [provider]  sertraline (ZOLOFT) 50 MG tablet Take 50 mg by mouth daily. 08/01/19  Yes [provider]  traZODone (DESYREL) 150 MG tablet Take 150 mg by mouth at bedtime.   Yes [provider]  Blood Glucose Monitoring Suppl (ACCU-CHEK AVIVA PLUS) w/Device KIT  05/23/19   [provider]  ferrous sulfate 325 (65 FE) MG tablet Take 1 tablet (325 mg total) by mouth daily with breakfast. Patient not taking: Reported on 10/10/2020 11/06/19   Dessa Phi, DO  metFORMIN (GLUCOPHAGE) 1000 MG tablet Take 1 tablet (1,000 mg total) by mouth daily. Patient not taking: Reported on 10/10/2020 10/21/19   Murlean Iba, MD  Multiple Vitamin (MULTIVITAMIN WITH MINERALS) TABS tablet Take 1 tablet by mouth daily. Patient not taking: Reported on 10/10/2020 05/11/18   Edwin Dada, MD    Current Facility-Administered Medications  Medication Dose Route Frequency Provider Last Rate Last Admin   . 0.9 %  sodium chloride infusion   Intravenous Continuous Tawni Millers, MD 100 mL/hr at 10/11/20 0659 Infusion Verify at 10/11/20 0659  . acetaminophen (TYLENOL) tablet 650 mg  650 mg Oral Q6H PRN Arrien, Jimmy Picket, MD       Or  . acetaminophen (TYLENOL) suppository 650 mg  650 mg Rectal Q6H PRN Arrien, Jimmy Picket, MD      . enoxaparin (LOVENOX) injection 40 mg  40 mg Subcutaneous Q24H Tawni Millers, MD   40 mg at 10/10/20 1949  . ferrous sulfate tablet 325 mg  325 mg Oral Q breakfast Arrien, Jimmy Picket, MD   325 mg at 10/11/20 0825  . insulin aspart (novoLOG) injection 0-9 Units  0-9 Units Subcutaneous TID WC Arrien, Jimmy Picket, MD   7 Units at 10/11/20 0912  . morphine 2 MG/ML injection 2 mg  2 mg Intravenous Q2H PRN Arrien, Jimmy Picket, MD   2 mg at 10/11/20 0617  . multivitamin with minerals tablet 1 tablet  1 tablet Oral Daily Arrien, Jimmy Picket, MD   1 tablet at 10/11/20 0825  . ondansetron (ZOFRAN) tablet 4 mg  4 mg Oral Q6H PRN Arrien, Jimmy Picket, MD       Or  . ondansetron [facility]) injection 4 mg  4 mg Intravenous Q6H PRN Arrien, Jimmy Picket, MD      . oxyCODONE-acetaminophen (PERCOCET/ROXICET) 5-325 MG per tablet 1 tablet  1 tablet Oral Q4H PRN Arrien, Jimmy Picket, MD   1 tablet at 10/11/20 0846  . pantoprazole (PROTONIX) injection 40 mg  40 mg Intravenous Q12H Arrien, Jimmy Picket, MD   40 mg at 10/11/20 0825  . QUEtiapine (SEROQUEL) tablet 300 mg  300 mg Oral QHS Arrien, Jimmy Picket, MD   300 mg at 10/10/20 2104  . sertraline (ZOLOFT) tablet 50 mg  50 mg Oral Daily Arrien, Jimmy Picket, MD   50 mg at 10/11/20 0825  . sucralfate (CARAFATE) 1 GM/10ML suspension 1 g  1 g Oral TID WC & HS Arrien, Jimmy Picket, MD   1 g at 10/11/20 0825  . traZODone (DESYREL) tablet 150 mg  150 mg Oral QHS Arrien, Jimmy Picket, MD   150 mg at 10/10/20 2105    Allergies as of 10/10/2020  . (No Known Allergies)    Family  History  Problem Relation Age of Onset  . Hypertension Other   . Diabetes Mellitus II Other     Social History   Socioeconomic History  . Marital status: Single    Spouse name: Not on file  . Number of children: Not on file  . Years of education: Not on file  . Highest education level: Not on file  Occupational History  . Not on file  Tobacco Use  . Smoking status: Current Every Day Smoker    Types: Cigarettes  . Smokeless tobacco: Never Used  Vaping Use  . Vaping Use: Never used  Substance and Sexual Activity  . Alcohol use: Not Currently    Comment: occasional  . Drug use: Not Currently    Types: Cocaine  . Sexual activity: Not on file  Other Topics Concern  . Not on file  Social History Narrative  . Not on file   Social Determinants of Health   Financial Resource Strain: Not on file  Food Insecurity: Not on file  Transportation Needs: Not on file  Physical Activity: Not on file  Stress: Not on file  Social Connections: Not on file  Intimate Partner Violence: Not on file    Review of Systems: All systems reviewed and negative except where noted in HPI.   OBJECTIVE:    Physical Exam: Vital signs in last 24 hours: Temp:  [97.9 F (36.6 C)-98.6 F (37 C)] 98.3 F (36.8 C) (03/13 0409) Pulse Rate:  [98-112] 98 (03/13 0409) Resp:  [17-25] 18 (03/13 0409) BP: (91-147)/(61-102) 91/61 (03/13 0409) SpO2:  [96 %-100 %] 97 % (03/13 0409) Last BM Date: 10/10/20 General:   Alert  male in NAD Psych:  Pleasant, cooperative. Normal mood and affect. Eyes:  Pupils  equal, sclera clear, no icterus.   Conjunctiva pink. Mouth:  Significant swelling of lower lip. No obvious candida in mouth. Tongue slightly dry Ears:  Normal auditory acuity. Nose:  No deformity, discharge,  or lesions. Neck:  Supple; no masses Lungs:  Clear throughout to auscultation.   No wheezes, crackles, or rhonchi.  Heart:  Regular rate and rhythm; no murmurs, no lower extremity edema Abdomen:   Soft, non-distended, nontender, BS active, no palp mass   Rectal:  Deferred  Msk:  Bilateral BKA . Neurologic:  Alert and  oriented x4;  grossly normal neurologically. Skin:  Intact without significant lesions or rashes.  Scheduled inpatient medications . enoxaparin (LOVENOX) injection  40 mg Subcutaneous Q24H  . ferrous sulfate  325 mg Oral Q breakfast  . insulin aspart  0-9 Units Subcutaneous TID WC  . multivitamin with minerals  1 tablet Oral Daily  . pantoprazole (PROTONIX) IV  40 mg Intravenous Q12H  . QUEtiapine  300 mg Oral QHS  . sertraline  50 mg Oral Daily  . sucralfate  1 g Oral TID WC & HS  . traZODone  150 mg Oral QHS      Intake/Output from previous day: 03/12 0701 - 03/13 0700 In: 2533.3 [P.O.:240; I.V.:1200; IV Piggyback:1093.3] Out: 1400 [Urine:1400] Intake/Output this shift: Total I/O In: 540 [P.O.:540] Out: -    Lab Results: Recent Labs    10/10/20 1226 10/10/20 1335 10/11/20 0357  WBC 31.6* 32.4* 15.7*  HGB 14.9 14.6 11.9*  HCT 43.6 41.2 34.1*  PLT 203 184 89*   BMET Recent Labs    10/10/20 1226 10/11/20 0357  NA 134* 133*  K 4.1 3.6  CL 100 105  CO2 20* 20*  GLUCOSE 143* 161*  BUN 33* 28*  CREATININE 1.36* 0.94  CALCIUM 9.4 8.3*   LFT Recent Labs    10/10/20 1226  PROT 7.4  ALBUMIN 3.9  AST 34  ALT 20  ALKPHOS 86  BILITOT 3.4*   PT/INR No results for input(s): LABPROT, INR in the last 72 hours. Hepatitis Panel No results for input(s): HEPBSAG, HCVAB, HEPAIGM, HEPBIGM in the last 72 hours.   . CBC Latest Ref Rng & Units 10/11/2020 10/10/2020 10/10/2020  WBC 4.0 - 10.5 K/uL 15.7(H) 32.4(H) 31.6(H)  Hemoglobin 13.0 - 17.0 g/dL 11.9(L) 14.6 14.9  Hematocrit 39.0 - 52.0 % 34.1(L) 41.2 43.6  Platelets 150 - 400 K/uL 89(L) 184 203    . CMP Latest Ref Rng & Units 10/11/2020 10/10/2020 11/19/2019  Glucose 70 - 99 mg/dL 161(H) 143(H) 109(H)  BUN 6 - 20 mg/dL 28(H) 33(H) 10  Creatinine 0.61 - 1.24 mg/dL 0.94 1.36(H) 0.57(L)   Sodium 135 - 145 mmol/L 133(L) 134(L) 137  Potassium 3.5 - 5.1 mmol/L 3.6 4.1 3.6  Chloride 98 - 111 mmol/L 105 100 109  CO2 22 - 32 mmol/L 20(L) 20(L) 19(L)  Calcium 8.9 - 10.3 mg/dL 8.3(L) 9.4 8.4(L)  Total Protein 6.5 - 8.1 g/dL - 7.4 7.4  Total Bilirubin 0.3 - 1.2 mg/dL - 3.4(H) 2.1(H)  Alkaline Phos 38 - 126 U/L - 86 105  AST 15 - 41 U/L - 34 19  ALT 0 - 44 U/L - 20 14   Studies/Results: DG Chest 2 View  Result Date: 10/10/2020 CLINICAL DATA:  Chest pain. EXAM: CHEST - 2 VIEW COMPARISON:  October 19, 2019 FINDINGS: The heart size and mediastinal contours are within normal limits. Both lungs are clear. The visualized skeletal structures are unremarkable. IMPRESSION: No active cardiopulmonary  disease. Electronically Signed   By: Gerome Sam III M.D   On: 10/10/2020 13:25   CT Soft Tissue Neck W Contrast  Result Date: 10/10/2020 CLINICAL DATA:  Neck mass. Lower lip swelling and bruising. Possible puncture wound. Nausea, vomiting, and throat pain. EXAM: CT NECK WITH CONTRAST TECHNIQUE: Multidetector CT imaging of the neck was performed using the standard protocol following the bolus administration of intravenous contrast. CONTRAST:  OMNIPAQUE IOHEXOL 350 MG/ML SOLN COMPARISON:  CTs of the cervical spine and orbits 05/02/2020. FINDINGS: Pharynx and larynx: No gross mass or swelling is identified, however there is extensive motion artifact through the oral cavity and oropharynx. The airway is widely patent. There is no retropharyngeal fluid collection. Salivary glands: No inflammation, mass, or stone. Thyroid: Unremarkable. Lymph nodes: No enlarged or suspicious lymph nodes in the neck. Vascular: Moderate calcified plaque at the left greater than right carotid bifurcations. Likely significant stenosis of the proximal ICA with assessment limited by motion artifact and non angiographic technique. Limited intracranial: Unremarkable. Visualized orbits: Unremarkable. Mastoids and visualized  paranasal sinuses: Clear. Skeleton: Absence of the maxillary and majority of the mandibular dentition. Moderate cervical spondylosis. Upper chest: Report separately. Other: Diffuse soft tissue swelling of the lower lip. No discrete mass or fluid collection identified within limitations of motion artifact. IMPRESSION: 1. Motion degraded examination. 2. Nonspecific lower lip soft tissue swelling. 3. Carotid atherosclerosis with likely significant proximal left ICA stenosis. Consider carotid Doppler ultrasound for further evaluation. Electronically Signed   By: Sebastian Ache M.D.   On: 10/10/2020 16:07   CT Angio Chest/Abd/Pel for Dissection W and/or W/WO  Result Date: 10/10/2020 CLINICAL DATA:  Abdominal pain, aortic dissection suspected EXAM: CT ANGIOGRAPHY CHEST, ABDOMEN AND PELVIS TECHNIQUE: Non-contrast CT of the chest was initially obtained. Multidetector CT imaging through the chest, abdomen and pelvis was performed using the standard protocol during bolus administration of intravenous contrast. Multiplanar reconstructed images and MIPs were obtained and reviewed to evaluate the vascular anatomy. CONTRAST:  OMNIPAQUE IOHEXOL 350 MG/ML SOLN COMPARISON:  None. FINDINGS: CTA CHEST FINDINGS Cardiovascular: Preferential opacification of the thoracic aorta. Normal contour and caliber of the thoracic aorta. No evidence of aneurysm, dissection or other aortic pathology. Incidental note of bovine type 2 vessel branching pattern aortic arch. Normal heart size. No pericardial effusion. Three-vessel coronary artery calcifications. Mediastinum/Nodes: No enlarged mediastinal, hilar, or axillary lymph nodes. There is diffuse circumferential thickening of the lower half of the esophagus (series 5, image 83, series 4, image 114). Thyroid gland and trachea demonstrate no significant findings. Lungs/Pleura: Lungs are clear. No pleural effusion or pneumothorax. Musculoskeletal: No chest wall abnormality. No acute or  significant osseous findings. Review of the MIP images confirms the above findings. CTA ABDOMEN AND PELVIS FINDINGS VASCULAR Normal contour and caliber of the abdominal aorta. No evidence of aneurysm, dissection or other acute aortic pathology. 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 Solitary left renal artery. Otherwise standard branching pattern of the abdominal aorta. The branch vessel origins are patent. Mild aortic atherosclerosis. Review of the MIP images confirms the above findings. NON-VASCULAR Hepatobiliary: Grossly cirrhotic morphology of the liver. No solid liver abnormality is seen. No gallstones, gallbladder wall thickening, or biliary dilatation. Pancreas: Unremarkable. No pancreatic ductal dilatation or surrounding inflammatory changes. Spleen: Splenomegaly, maximum coronal span 18.6 cm. Adrenals/Urinary Tract: Adrenal glands are unremarkable. Kidneys are normal, without renal calculi, solid lesion, or hydronephrosis. Bladder is unremarkable. Stomach/Bowel: Gastroesophageal varices. Appendix appears normal. No evidence of bowel wall thickening, distention, or  inflammatory changes. Lymphatic: No enlarged abdominal or pelvic lymph nodes. Reproductive: No mass or other significant abnormality. Other: No abdominal wall hernia or abnormality. No abdominopelvic ascites. Musculoskeletal: Chronic bilateral pars defects and degenerative listhesis of L4 on L5. Review of the MIP images confirms the above findings. IMPRESSION: 1. Normal contour and caliber of the thoracic and abdominal aorta. No evidence of aneurysm, dissection or other acute aortic pathology. Mild aortic atherosclerosis. 2. There is diffuse circumferential thickening of the lower half of the esophagus, which may be related to reflux esophagitis. Malignancy is however difficult to exclude. 3. Grossly cirrhotic morphology of the liver with stigmata of portal hypertension including splenomegaly and gastroesophageal varices. 4. Coronary  artery disease. Aortic Atherosclerosis (ICD10-I70.0). Electronically Signed   By: Eddie Candle M.D.   On: 10/10/2020 16:08    Principal Problem:   Leukocytosis Active Problems:   Diabetes mellitus type II, non insulin dependent ([place])   Depressed bipolar disorder ([place])   Hypertension   Below-knee amputation of right lower extremity ([place])   Cirrhosis of liver ([facility])   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[place])   GERD (gastroesophageal reflux disease)    Tye Savoy, NP-C @  10/11/2020, 11:24 AM

## 2020-10-12 ENCOUNTER — Inpatient Hospital Stay (HOSPITAL_COMMUNITY): Payer: Medicare Other | Admitting: Anesthesiology

## 2020-10-12 ENCOUNTER — Encounter (HOSPITAL_COMMUNITY): Payer: Self-pay | Admitting: Internal Medicine

## 2020-10-12 ENCOUNTER — Encounter (HOSPITAL_COMMUNITY)
Admission: EM | Disposition: A | Discharge status: Home / Self Care | Payer: Self-pay | Source: Home / Self Care | Attending: Internal Medicine

## 2020-10-12 DIAGNOSIS — D72829 Elevated white blood cell count, unspecified: Secondary | ICD-10-CM | POA: Diagnosis not present

## 2020-10-12 LAB — PATHOLOGIST SMEAR REVIEW

## 2020-10-12 LAB — BASIC METABOLIC PANEL
Anion gap: 3 — ABNORMAL LOW (ref 5–15)
BUN: 13 mg/dL (ref 6–20)
CO2: 22 mmol/L (ref 22–32)
Calcium: 8 mg/dL — ABNORMAL LOW (ref 8.9–10.3)
Chloride: 108 mmol/L (ref 98–111)
Creatinine, Ser: 0.73 mg/dL (ref 0.61–1.24)
GFR, Estimated: >60 mL/min (ref >60)
Glucose, Bld: 117 mg/dL — ABNORMAL HIGH (ref 70–99)
Potassium: 3.4 mmol/L — ABNORMAL LOW (ref 3.5–5.1)
Sodium: 133 mmol/L — ABNORMAL LOW (ref 135–145)

## 2020-10-12 LAB — URINE CULTURE

## 2020-10-12 LAB — GLUCOSE, CAPILLARY
Glucose-Capillary: 118 mg/dL — ABNORMAL HIGH (ref 70–99)
Glucose-Capillary: 144 mg/dL — ABNORMAL HIGH (ref 70–99)
Glucose-Capillary: 187 mg/dL — ABNORMAL HIGH (ref 70–99)
Glucose-Capillary: 100 mg/dL — ABNORMAL HIGH (ref 70–99)

## 2020-10-12 LAB — PHOSPHORUS: Phosphorus: 2.7 mg/dL (ref 2.5–4.6)

## 2020-10-12 LAB — CBC
HCT: 30.1 % — ABNORMAL LOW (ref 39.0–52.0)
Hemoglobin: 10.3 g/dL — ABNORMAL LOW (ref 13.0–17.0)
MCH: 33 pg (ref 26.0–34.0)
MCHC: 34.2 g/dL (ref 30.0–36.0)
MCV: 96.5 fL (ref 80.0–100.0)
Platelets: 75 10*3/uL — ABNORMAL LOW (ref 150–400)
RBC: 3.12 MIL/uL — ABNORMAL LOW (ref 4.22–5.81)
RDW: 14.3 % (ref 11.5–15.5)
WBC: 7.4 10*3/uL (ref 4.0–10.5)
nRBC: 0 % (ref 0.0–0.2)

## 2020-10-12 LAB — HEPATITIS B SURFACE ANTIGEN: Hepatitis B Surface Ag: NONREACTIVE

## 2020-10-12 LAB — HEPATITIS B SURFACE ANTIBODY,QUALITATIVE: Hep B S Ab: NONREACTIVE

## 2020-10-12 LAB — MAGNESIUM: Magnesium: 1.7 mg/dL (ref 1.7–2.4)

## 2020-10-12 LAB — AFP TUMOR MARKER: AFP, Serum, Tumor Marker: 2.3 ng/mL (ref 0.0–8.4)

## 2020-10-12 LAB — HEPATITIS A ANTIBODY, TOTAL: hep A Total Ab: NONREACTIVE

## 2020-10-12 SURGERY — CANCELLED PROCEDURE

## 2020-10-12 SURGICAL SUPPLY — 14 items

## 2020-10-12 NOTE — Progress Notes (Signed)
Patient currently in Endo IV team unable to assess for IV placement RN made aware to notify IV team when the patient returns.

## 2020-10-12 NOTE — Progress Notes (Signed)
UNASSIGNED PATIENT Subjective: [name] is a 60 year old white male with multiple medical problems including longstanding Type II diabetes complicated by bilateral BKA's, anxiety disorder., bipolar disorder, cirrhosis of the liver and hypertension who was admitted for severe retrosternal burning after drinking orange juice.  He was brought down to the endoscopy unit for possible EGD but due to the severe swelling of his lower lip the procedure was aborted.  Next explained to the patient that placing a bite block would cause significant pressure on the lower lip and would worsen his situation.  Objective: Vital signs in last 24 hours: Temp:  [97.5 F (36.4 C)-99.3 F (37.4 C)] 98.5 F (36.9 C) (03/14 1444) Pulse Rate:  [77-104] 94 (03/14 1444) Resp:  [12-19] 12 (03/14 1444) BP: (106-133)/(58-73) 133/67 (03/14 1444) SpO2:  [97 %-100 %] 97 % (03/14 1444) Last BM Date: 10/09/20  Intake/Output from previous day: 03/13 0701 - 03/14 0700 In: 2640 [P.O.:2640] Out: 950 [Urine:950] Intake/Output this shift: No intake/output data recorded.  General appearance: alert, cooperative, no distress, mildly obese and pale; his lower limbs is extremely swollen with patchy crusting lesions; the upper lip appears normal; patient was positive but could not remember the names of his physicians Resp: clear to auscultation bilaterally Cardio: regular rate and rhythm, S1, S2 normal, no murmur, click, rub or gallop GI: soft, non-tender; bowel sounds normal; no masses,  no organomegaly Extremities: Patient had bilateral BKA's Lab Results: Recent Labs    10/10/20 1335 10/11/20 0357 10/12/20 0249  WBC 32.4* 15.7* 7.4  HGB 14.6 11.9* 10.3*  HCT 41.2 34.1* 30.1*  PLT 184 89* 75*   BMET Recent Labs    10/10/20 1226 10/11/20 0357 10/12/20 0249  NA 134* 133* 133*  K 4.1 3.6 3.4*  CL 100 105 108  CO2 20* 20* 22  GLUCOSE 143* 161* 117*  BUN 33* 28* 13  CREATININE 1.36* 0.94 0.73  CALCIUM 9.4  8.3* 8.0*   LFT Recent Labs    10/10/20 1226  PROT 7.4  ALBUMIN 3.9  AST 34  ALT 20  ALKPHOS 86  BILITOT 3.4*   PT/INR Recent Labs    10/11/20 1453  LABPROT 16.5*  INR 1.4*   Hepatitis Panel Recent Labs    10/12/20 0249  HEPBSAG NON REACTIVE   Studies/Results: CT Soft Tissue Neck W Contrast  Result Date: 10/10/2020 CLINICAL DATA:  Neck mass. Lower lip swelling and bruising. Possible puncture wound. Nausea, vomiting, and throat pain. EXAM: CT NECK WITH CONTRAST TECHNIQUE: Multidetector CT imaging of the neck was performed using the standard protocol following the bolus administration of intravenous contrast. CONTRAST:  OMNIPAQUE IOHEXOL 350 MG/ML SOLN COMPARISON:  CTs of the cervical spine and orbits 05/02/2020. FINDINGS: Pharynx and larynx: No gross mass or swelling is identified, however there is extensive motion artifact through the oral cavity and oropharynx. The airway is widely patent. There is no retropharyngeal fluid collection. Salivary glands: No inflammation, mass, or stone. Thyroid: Unremarkable. Lymph nodes: No enlarged or suspicious lymph nodes in the neck. Vascular: Moderate calcified plaque at the left greater than right carotid bifurcations. Likely significant stenosis of the proximal ICA with assessment limited by motion artifact and non angiographic technique. Limited intracranial: Unremarkable. Visualized orbits: Unremarkable. Mastoids and visualized paranasal sinuses: Clear. Skeleton: Absence of the maxillary and majority of the mandibular dentition. Moderate cervical spondylosis. Upper chest: Report separately. Other: Diffuse soft tissue swelling of the lower lip. No discrete mass or fluid collection identified within limitations of motion artifact.  IMPRESSION: 1. Motion degraded examination. 2. Nonspecific lower lip soft tissue swelling. 3. Carotid atherosclerosis with likely significant proximal left ICA stenosis. Consider carotid Doppler ultrasound for  further evaluation. Electronically Signed   By: Sebastian Ache M.D.   On: 10/10/2020 16:07   CT Angio Chest/Abd/Pel for Dissection W and/or W/WO  Result Date: 10/10/2020 CLINICAL DATA:  Abdominal pain, aortic dissection suspected EXAM: CT ANGIOGRAPHY CHEST, ABDOMEN AND PELVIS TECHNIQUE: Non-contrast CT of the chest was initially obtained. Multidetector CT imaging through the chest, abdomen and pelvis was performed using the standard protocol during bolus administration of intravenous contrast. Multiplanar reconstructed images and MIPs were obtained and reviewed to evaluate the vascular anatomy. CONTRAST:  OMNIPAQUE IOHEXOL 350 MG/ML SOLN COMPARISON:  None. FINDINGS: CTA CHEST FINDINGS Cardiovascular: Preferential opacification of the thoracic aorta. Normal contour and caliber of the thoracic aorta. No evidence of aneurysm, dissection or other aortic pathology. Incidental note of bovine type 2 vessel branching pattern aortic arch. Normal heart size. No pericardial effusion. Three-vessel coronary artery calcifications. Mediastinum/Nodes: No enlarged mediastinal, hilar, or axillary lymph nodes. There is diffuse circumferential thickening of the lower half of the esophagus (series 5, image 83, series 4, image 114). Thyroid gland and trachea demonstrate no significant findings. Lungs/Pleura: Lungs are clear. No pleural effusion or pneumothorax. Musculoskeletal: No chest wall abnormality. No acute or significant osseous findings. Review of the MIP images confirms the above findings. CTA ABDOMEN AND PELVIS FINDINGS VASCULAR Normal contour and caliber of the abdominal aorta. No evidence of aneurysm, dissection or other acute aortic pathology. 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 Solitary left renal artery. Otherwise standard branching pattern of the abdominal aorta. The branch vessel origins are patent. Mild aortic atherosclerosis. Review of the MIP images confirms the above findings. NON-VASCULAR  Hepatobiliary: Grossly cirrhotic morphology of the liver. No solid liver abnormality is seen. No gallstones, gallbladder wall thickening, or biliary dilatation. Pancreas: Unremarkable. No pancreatic ductal dilatation or surrounding inflammatory changes. Spleen: Splenomegaly, maximum coronal span 18.6 cm. Adrenals/Urinary Tract: Adrenal glands are unremarkable. Kidneys are normal, without renal calculi, solid lesion, or hydronephrosis. Bladder is unremarkable. Stomach/Bowel: Gastroesophageal varices. Appendix appears normal. No evidence of bowel wall thickening, distention, or inflammatory changes. Lymphatic: No enlarged abdominal or pelvic lymph nodes. Reproductive: No mass or other significant abnormality. Other: No abdominal wall hernia or abnormality. No abdominopelvic ascites. Musculoskeletal: Chronic bilateral pars defects and degenerative listhesis of L4 on L5. Review of the MIP images confirms the above findings. IMPRESSION: 1. Normal contour and caliber of the thoracic and abdominal aorta. No evidence of aneurysm, dissection or other acute aortic pathology. Mild aortic atherosclerosis. 2. There is diffuse circumferential thickening of the lower half of the esophagus, which may be related to reflux esophagitis. Malignancy is however difficult to exclude. 3. Grossly cirrhotic morphology of the liver with stigmata of portal hypertension including splenomegaly and gastroesophageal varices. 4. Coronary artery disease. Aortic Atherosclerosis (ICD10-I70.0). Electronically Signed   By: Lauralyn Primes M.D.   On: 10/10/2020 16:08   Medications: I have reviewed the patient's current medications.  Assessment/Plan: 1) GERD/Severe odynophagia with thickening of the distal esophagus on CTA-as the patient's lower lip is severely swollen a barium swallow will be done for now. Further recommendation made after the barium swallow has been done continue IV PPIs and avoid acidic drinks like orange juice and grapefruit  juice. 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 3) AODM with BKA bilaterally. 4) Bipolar disorder/anxiety disorder  LOS: 1 day  Charna Elizabeth 10/12/2020, 3:34 PM

## 2020-10-12 NOTE — Anesthesia Preprocedure Evaluation (Deleted)
Anesthesia Evaluation  Patient identified by MRN, date of birth, ID band Patient awake    Reviewed: Allergy & Precautions, NPO status , Patient's Chart, lab work & pertinent test results  History of Anesthesia Complications Negative for: history of anesthetic complications  Airway Mallampati: III  TM Distance: >3 FB Neck ROM: Full    Dental  (+) Edentulous Upper, Missing,  Marked edema of lower lip with abrasions, ulceration, and necrosis:   Pulmonary Current Smoker,    Pulmonary exam normal        Cardiovascular hypertension, Normal cardiovascular exam     Neuro/Psych Anxiety Bipolar Disorder negative neurological ROS     GI/Hepatic Neg liver ROS, GERD  Controlled and Medicated,  Endo/Other  diabetes, Type 2, Oral Hypoglycemic Agents  Renal/GU negative Renal ROS  negative genitourinary   Musculoskeletal negative musculoskeletal ROS (+)   Abdominal   Peds  Hematology  (+) anemia , Hgb 10.3, plt 75k   Anesthesia Other Findings Day of surgery medications reviewed with patient.  Reproductive/Obstetrics negative OB ROS                             Anesthesia Physical Anesthesia Plan  ASA: III  Anesthesia Plan: MAC   Post-op Pain Management:    Induction:   PONV Risk Score and Plan: Treatment may vary due to age or medical condition and Propofol infusion  Airway Management Planned: Natural Airway and Nasal Cannula  Additional Equipment: None  Intra-op Plan:   Post-operative Plan:   Informed Consent: I have reviewed the patients History and Physical, chart, labs and discussed the procedure including the risks, benefits and alternatives for the proposed anesthesia with the patient or authorized representative who has indicated his/her understanding and acceptance.       Plan Discussed with: CRNA  Anesthesia Plan Comments:         Anesthesia Quick Evaluation

## 2020-10-12 NOTE — Progress Notes (Signed)
PROGRESS NOTE    Cory Palmer  ZOX:[id number] DOB: 06/09/1961 DOA: 10/10/2020 PCP: Patient, [name] Per   Brief Narrative:  This 60 years old male with PMH significant for bilateral BKA, anxiety disorder, bipolar, type II DM, hypertension presents in the ED with significant lower lip edema and odynophagia(pain while swallowing).  Patient lives alone, is able to do his activities of daily living independently.  Patient reports having excruciating burning pain in his throat radiating towards the stomach after drinking orange juice 2 days ago since then he has been experiencing persistent moderate to severe burning pain that gets worse with swallowing.  Patient also reports he has not been able to take his home medication because of the pain.  EMS was called by his neighbor when he went to see him and found him with severe lip swelling.  Patient denies any fall or trauma to the lip or face.  Soft tissue CT neck showed no obstruction,  CT abdomen pelvis without any significant changes.  Patient is found to have severe leukocytosis which could be reactive.  GI consulted, scheduled to have EGD today.  Assessment & Plan:   Principal Problem:   Leukocytosis Active Problems:   Diabetes mellitus type II, non insulin dependent (HCC)   Depressed bipolar disorder (HCC)   Hypertension   Below-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)   Cirrhosis of liver (HCC)   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HCC)   GERD (gastroesophageal reflux disease)   Odynophagia due to severe esophagitis: Patient reports severe burning pain in the throat radiating towards the stomach after drinking orange juice 2 days ago. He is not able to take his medication because of the pain while swallowing Continue antiacid therapy with sucralfate 4 times daily and twice daily IV pantoprazole 40 mg daily. Clear liquid diet for now and advance as tolerated. Antiemetic and analgesics iv as needed. GI consulted,  recommended EGD to find out severe esophagitis, scheduled for today.  Leukocytosis /lactic acidosis :> Resolved. His leukocytosis and elevated lactic acid seems to be reactive,  There are no signs of bacterial infection, antibiotics were held. Blood culture grew Streptococcus, started on ceftriaxone 2 g daily. Monitor WBC trend.  Acute kidney injury> Resolved. Likely prerenal, continue hydration with isotonic saline at 100 mill per hour. Follow-up kidney function in the morning, avoid hypotension and nephrotoxic agents.  Type 2 diabetes mellitus.  Hold on Metformin, continue glucose coverage and monitoring with insulin sliding scale.  Iron deficiency anemia.  Cell count remained stable, continue oral iron supplements.  Bipolar/depression.  Continue Seroquel, sertraline and trazodone.   DVT prophylaxis: Lovenox Code Status: Full code Family Communication: No family at bedside Disposition Plan:   Status is: Inpatient  Remains inpatient appropriate because:Inpatient level of care appropriate due to severity of illness   Dispo: The patient is from: Home              Anticipated d/c is to: TBD              Patient currently is not medically stable to d/c.   Difficult to place patient No   Consultants:   Gastroenterology  Procedures: Schedule EGD tomorrow morning  Antimicrobials:    Anti-infectives (From admission, onward)   Start     Dose/Rate Route Frequency Ordered Stop   10/11/20 1400  [MAR Hold]  cefTRIAXone (ROCEPHIN) 2 g in sodium chloride 0.9 % 100 mL IVPB        (MAR Hold since Mon  10/12/2020 at 1433.Hold Reason: Transfer to a Procedural area.)   2 g 200 mL/hr over 30 Minutes Intravenous Every 24 hours 10/11/20 1233     10/10/20 2055  vancomycin variable dose per unstable renal function (pharmacist dosing)  Status:  Discontinued         Does not apply See admin instructions 10/10/20 2055 10/11/20 1115   10/10/20 1515  vancomycin variable dose per unstable  renal function (pharmacist dosing)  Status:  Discontinued         Does not apply See admin instructions 10/10/20 1515 10/10/20 1816   10/10/20 1445  ceFEPIme (MAXIPIME) 2 g in sodium chloride 0.9 % 100 mL IVPB        2 g 200 mL/hr over 30 Minutes Intravenous  Once 10/10/20 1435 10/10/20 1502   10/10/20 1445  vancomycin (VANCOREADY) IVPB 1750 mg/350 mL        1,750 mg 175 mL/hr over 120 Minutes Intravenous  Once 10/10/20 1444 10/10/20 1819         Subjective: Patient was seen and examined at bedside.  Overnight events noted.   Patient reports having pain while swallowing.  Denies any other complaints. Patient denies any difficulty breathing, chest pain, palpitations. He is scheduled to have EGD later today.  Objective: Vitals:   10/11/20 2006 10/12/20 0351 10/12/20 1244 10/12/20 1444  BP: (!) 106/58 110/73 132/64 133/67  Pulse: 77 82 (!) 104 94  Resp: Temp: 99.3 F (37.4 C) 98.4 F (36.9 C) (!) 97.5 F (36.4 C) 98.5 F (36.9 C)  TempSrc: Oral Oral Axillary Axillary  SpO2: 100% 100% 99% 97%    Intake/Output Summary (Last 24 hours) at 10/12/2020 1524 Last data filed at 10/11/2020 2000 Gross per 24 hour  Intake 1080 ml  Output --  Net 1080 ml   There were no vitals filed for this visit.  Examination:  General exam: Appears calm and comfortable, not in acute distress. Respiratory system: Clear to auscultation. Respiratory effort normal. Cardiovascular system: S1 & S2 heard, RRR. No JVD, murmurs, rubs, gallops or clicks. No pedal edema. Gastrointestinal system: Abdomen is nondistended, soft and nontender. No organomegaly or masses felt. Normal bowel sounds heard. Central nervous system: Alert and oriented. No focal neurological deficits. Extremities: Bilateral BKA. Skin: No rashes, lesions or ulcers Psychiatry: Judgement and insight appear normal. Mood & affect appropriate.     Data Reviewed: I have personally reviewed following labs and imaging  studies  CBC: Recent Labs  Lab 10/10/20 1226 10/10/20 1335 10/11/20 0357 10/12/20 0249  WBC 31.6* 32.4* 15.7* 7.4  NEUTROABS  --  25.3*  --   --   HGB 14.9 14.6 11.9* 10.3*  HCT 43.6 41.2 34.1* 30.1*  MCV 95.2 92.8 94.5 96.5  PLT 203 184 89* 75*   Basic Metabolic Panel: Recent Labs  Lab 10/10/20 1226 10/11/20 0357 10/12/20 0249  NA 134* 133* 133*  K 4.1 3.6 3.4*  CL 100 105 108  CO2 20* 20* 22  GLUCOSE 143* 161* 117*  BUN 33* 28* 13  CREATININE 1.36* 0.94 0.73  CALCIUM 9.4 8.3* 8.0*  MG  --   --  1.7  PHOS  --   --  2.7   GFR: CrCl cannot be calculated (Unknown ideal weight.). Liver Function Tests: Recent Labs  Lab 10/10/20 1226  AST 34  ALT 20  ALKPHOS 86  BILITOT 3.4*  PROT 7.4  ALBUMIN 3.9   Recent Labs  Lab 10/10/20 1226  LIPASE  28   No results for input(s): AMMONIA in the last 168 hours. Coagulation Profile: Recent Labs  Lab 10/11/20 1453  INR 1.4*   Cardiac Enzymes: No results for input(s): CKTOTAL, CKMB, CKMBINDEX, TROPONINI in the last 168 hours. BNP (last 3 results) No results for input(s): PROBNP in the last 8760 hours. HbA1C: Recent Labs    10/10/20 1335  HGBA1C 6.6*   CBG: Recent Labs  Lab 10/11/20 1235 10/11/20 1704 10/11/20 2008 10/12/20 0726 10/12/20 1135  GLUCAP 147* 146* 147* 118* 144*   Lipid Profile: No results for input(s): CHOL, HDL, LDLCALC, TRIG, CHOLHDL, LDLDIRECT in the last 72 hours. Thyroid Function Tests: No results for input(s): TSH, T4TOTAL, FREET4, T3FREE, THYROIDAB in the last 72 hours. Anemia Panel: No results for input(s): VITAMINB12, FOLATE, FERRITIN, TIBC, IRON, RETICCTPCT in the last 72 hours. Sepsis Labs: Recent Labs  Lab 10/10/20 1335 10/10/20 1601  LATICACIDVEN 2.7* 3.3*    Recent Results (from the past 240 hour(s))  Culture, blood (routine x 2)     Status: Abnormal (Preliminary result)   Collection Time: 10/10/20  1:49 PM   Specimen: BLOOD  Result Value Ref Range Status   Specimen  Description BLOOD SITE NOT SPECIFIED  Final   Special Requests   Final    BOTTLES DRAWN AEROBIC AND ANAEROBIC Blood Culture adequate volume   Culture  Setup Time   Final    GRAM POSITIVE COCCI IN CHAINS AEROBIC BOTTLE ONLY CRITICAL RESULT CALLED TO, READ BACK BY AND VERIFIED WITH: PHARMD G.BARR AT 1222 ON 10/11/2020 BY T.SAAD    Culture (A)  Final    STREPTOCOCCUS MITIS/ORALIS THE SIGNIFICANCE OF ISOLATING THIS ORGANISM FROM A SINGLE SET OF BLOOD CULTURES WHEN MULTIPLE SETS ARE DRAWN IS UNCERTAIN. PLEASE NOTIFY THE MICROBIOLOGY DEPARTMENT WITHIN ONE WEEK IF SPECIATION AND SENSITIVITIES ARE REQUIRED. Performed at [facility], [address] [place], [place] [address]    Report Status PENDING  Incomplete  Culture, blood (routine x 2)     Status: None (Preliminary result)   Collection Time: 10/10/20  1:49 PM   Specimen: BLOOD  Result Value Ref Range Status   Specimen Description BLOOD SITE NOT SPECIFIED  Final   Special Requests   Final    BOTTLES DRAWN AEROBIC AND ANAEROBIC Blood Culture adequate volume   Culture   Final    NO GROWTH 2 DAYS Performed at [facility], [address], [place], [place] [address]    Report Status PENDING  Incomplete  Blood Culture ID Panel (Reflexed)     Status: Abnormal   Collection Time: 10/10/20  1:49 PM  Result Value Ref Range Status   Enterococcus faecalis NOT DETECTED NOT DETECTED Final   Enterococcus Faecium NOT DETECTED NOT DETECTED Final   Listeria monocytogenes NOT DETECTED NOT DETECTED Final   Staphylococcus species NOT DETECTED NOT DETECTED Final   Staphylococcus aureus (BCID) NOT DETECTED NOT DETECTED Final   Staphylococcus epidermidis NOT DETECTED NOT DETECTED Final   Staphylococcus lugdunensis NOT DETECTED NOT DETECTED Final   Streptococcus species DETECTED (A) NOT DETECTED Final    Comment: Not Enterococcus species, Streptococcus agalactiae, Streptococcus pyogenes, or Streptococcus pneumoniae. CRITICAL RESULT CALLED TO,  READ BACK BY AND VERIFIED WITH: PHARMD G.BARR AT 1222 ON 10/11/2020 BY T.SAAD    Streptococcus agalactiae NOT DETECTED NOT DETECTED Final   Streptococcus pneumoniae NOT DETECTED NOT DETECTED Final   Streptococcus pyogenes NOT DETECTED NOT DETECTED Final   A.calcoaceticus-baumannii NOT DETECTED NOT DETECTED Final   Bacteroides  fragilis NOT DETECTED NOT DETECTED Final   Enterobacterales NOT DETECTED NOT DETECTED Final   Enterobacter cloacae complex NOT DETECTED NOT DETECTED Final   Escherichia coli NOT DETECTED NOT DETECTED Final   Klebsiella aerogenes NOT DETECTED NOT DETECTED Final   Klebsiella oxytoca NOT DETECTED NOT DETECTED Final   Klebsiella pneumoniae NOT DETECTED NOT DETECTED Final   Proteus species NOT DETECTED NOT DETECTED Final   Salmonella species NOT DETECTED NOT DETECTED Final   Serratia marcescens NOT DETECTED NOT DETECTED Final   Haemophilus influenzae NOT DETECTED NOT DETECTED Final   Neisseria meningitidis NOT DETECTED NOT DETECTED Final   Pseudomonas aeruginosa NOT DETECTED NOT DETECTED Final   Stenotrophomonas maltophilia NOT DETECTED NOT DETECTED Final   Candida albicans NOT DETECTED NOT DETECTED Final   Candida auris NOT DETECTED NOT DETECTED Final   Candida glabrata NOT DETECTED NOT DETECTED Final   Candida krusei NOT DETECTED NOT DETECTED Final   Candida parapsilosis NOT DETECTED NOT DETECTED Final   Candida tropicalis NOT DETECTED NOT DETECTED Final   Cryptococcus neoformans/gattii NOT DETECTED NOT DETECTED Final    Comment: Performed at [facility], [address] [place], [place] [address]  Resp Panel by RT-PCR (Flu A&B, Covid) Nasopharyngeal Swab     Status: None   Collection Time: 10/10/20  2:58 PM   Specimen: Nasopharyngeal Swab; Nasopharyngeal(NP) swabs in vial transport medium  Result Value Ref Range Status   SARS Coronavirus 2 by RT PCR NEGATIVE NEGATIVE Final    Comment: (NOTE) SARS-CoV-2 target nucleic acids are NOT DETECTED.  The  SARS-CoV-2 RNA is generally detectable in upper respiratory specimens during the acute phase of infection. The lowest concentration of SARS-CoV-2 viral copies this assay can detect is 138 copies/mL. A negative result does not preclude SARS-Cov-2 infection and should not be used as the sole basis for treatment or other patient management decisions. A negative result may occur with  improper specimen collection/handling, submission of specimen other than nasopharyngeal swab, presence of viral mutation(s) within the areas targeted by this assay, and inadequate number of viral copies(<138 copies/mL). A negative result must be combined with clinical observations, patient history, and epidemiological information. The expected result is Negative.  Fact Sheet for Patients:  BloggerCourse.com  Fact Sheet for Healthcare Providers:  SeriousBroker.it  This test is no t yet approved or cleared by the [place] FDA and  has been authorized for detection and/or diagnosis of SARS-CoV-2 by FDA under an Emergency Use Authorization (EUA). This EUA will remain  in effect (meaning this test can be used) for the duration of the COVID-19 declaration under Section 564(b)(1) of the Act, 21 U.S.C.section 360bbb-3(b)(1), unless the authorization is terminated  or revoked sooner.       Influenza A by PCR NEGATIVE NEGATIVE Final   Influenza B by PCR NEGATIVE NEGATIVE Final    Comment: (NOTE) The Xpert Xpress SARS-CoV-2/FLU/RSV plus assay is intended as an aid in the diagnosis of influenza from Nasopharyngeal swab specimens and should not be used as a sole basis for treatment. Nasal washings and aspirates are unacceptable for Xpert Xpress SARS-CoV-2/FLU/RSV testing.  Fact Sheet for Patients: BloggerCourse.com  Fact Sheet for Healthcare Providers: SeriousBroker.it  This test is not yet approved or  cleared by the [place] FDA and has been authorized for detection and/or diagnosis of SARS-CoV-2 by FDA under an Emergency Use Authorization (EUA). This EUA will remain in effect (meaning this test can be used) for the duration of the COVID-19 declaration under Section  564(b)(1) of the Act, 21 U.S.C. section 360bbb-3(b)(1), unless the authorization is terminated or revoked.  Performed at [facility], [address] [place], [place] [address]   Group A Strep by PCR     Status: None   Collection Time: 10/10/20  2:58 PM   Specimen: Nasopharyngeal Swab; Sterile Swab  Result Value Ref Range Status   Group A Strep by PCR NOT DETECTED NOT DETECTED Final    Comment: Performed at [facility], [address], [place], [place] [address]  Urine culture     Status: Abnormal   Collection Time: 10/11/20  4:54 AM   Specimen: Urine, Random  Result Value Ref Range Status   Specimen Description URINE, RANDOM  Final   Special Requests   Final    NONE Performed at [facility], [address] [place], [place] [address]    Culture MULTIPLE SPECIES PRESENT, SUGGEST RECOLLECTION (A)  Final   Report Status 10/12/2020 FINAL  Final    Radiology Studies: CT Soft Tissue Neck W Contrast  Result Date: 10/10/2020 CLINICAL DATA:  Neck mass. Lower lip swelling and bruising. Possible puncture wound. Nausea, vomiting, and throat pain. EXAM: CT NECK WITH CONTRAST TECHNIQUE: Multidetector CT imaging of the neck was performed using the standard protocol following the bolus administration of intravenous contrast. CONTRAST:  OMNIPAQUE IOHEXOL 350 MG/ML SOLN COMPARISON:  CTs of the cervical spine and orbits 05/02/2020. FINDINGS: Pharynx and larynx: No gross mass or swelling is identified, however there is extensive motion artifact through the oral cavity and oropharynx. The airway is widely patent. There is no retropharyngeal fluid collection. Salivary glands: No inflammation, mass, or stone.  Thyroid: Unremarkable. Lymph nodes: No enlarged or suspicious lymph nodes in the neck. Vascular: Moderate calcified plaque at the left greater than right carotid bifurcations. Likely significant stenosis of the proximal ICA with assessment limited by motion artifact and non angiographic technique. Limited intracranial: Unremarkable. Visualized orbits: Unremarkable. Mastoids and visualized paranasal sinuses: Clear. Skeleton: Absence of the maxillary and majority of the mandibular dentition. Moderate cervical spondylosis. Upper chest: Report separately. Other: Diffuse soft tissue swelling of the lower lip. No discrete mass or fluid collection identified within limitations of motion artifact. IMPRESSION: 1. Motion degraded examination. 2. Nonspecific lower lip soft tissue swelling. 3. Carotid atherosclerosis with likely significant proximal left ICA stenosis. Consider carotid Doppler ultrasound for further evaluation. Electronically Signed   By: Sebastian Ache M.D.   On: 10/10/2020 16:07   CT Angio Chest/Abd/Pel for Dissection W and/or W/WO  Result Date: 10/10/2020 CLINICAL DATA:  Abdominal pain, aortic dissection suspected EXAM: CT ANGIOGRAPHY CHEST, ABDOMEN AND PELVIS TECHNIQUE: Non-contrast CT of the chest was initially obtained. Multidetector CT imaging through the chest, abdomen and pelvis was performed using the standard protocol during bolus administration of intravenous contrast. Multiplanar reconstructed images and MIPs were obtained and reviewed to evaluate the vascular anatomy. CONTRAST:  OMNIPAQUE IOHEXOL 350 MG/ML SOLN COMPARISON:  None. FINDINGS: CTA CHEST FINDINGS Cardiovascular: Preferential opacification of the thoracic aorta. Normal contour and caliber of the thoracic aorta. No evidence of aneurysm, dissection or other aortic pathology. Incidental note of bovine type 2 vessel branching pattern aortic arch. Normal heart size. No pericardial effusion. Three-vessel coronary artery calcifications.  Mediastinum/Nodes: No enlarged mediastinal, hilar, or axillary lymph nodes. There is diffuse circumferential thickening of the lower half of the esophagus (series 5, image 83, series 4, image 114). Thyroid gland and trachea demonstrate no significant findings. Lungs/Pleura: Lungs are  clear. No pleural effusion or pneumothorax. Musculoskeletal: No chest wall abnormality. No acute or significant osseous findings. Review of the MIP images confirms the above findings. CTA ABDOMEN AND PELVIS FINDINGS VASCULAR Normal contour and caliber of the abdominal aorta. No evidence of aneurysm, dissection or other acute aortic pathology. 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 Solitary left renal artery. Otherwise standard branching pattern of the abdominal aorta. The branch vessel origins are patent. Mild aortic atherosclerosis. Review of the MIP images confirms the above findings. NON-VASCULAR Hepatobiliary: Grossly cirrhotic morphology of the liver. No solid liver abnormality is seen. No gallstones, gallbladder wall thickening, or biliary dilatation. Pancreas: Unremarkable. No pancreatic ductal dilatation or surrounding inflammatory changes. Spleen: Splenomegaly, maximum coronal span 18.6 cm. Adrenals/Urinary Tract: Adrenal glands are unremarkable. Kidneys are normal, without renal calculi, solid lesion, or hydronephrosis. Bladder is unremarkable. Stomach/Bowel: Gastroesophageal varices. Appendix appears normal. No evidence of bowel wall thickening, distention, or inflammatory changes. Lymphatic: No enlarged abdominal or pelvic lymph nodes. Reproductive: No mass or other significant abnormality. Other: No abdominal wall hernia or abnormality. No abdominopelvic ascites. Musculoskeletal: Chronic bilateral pars defects and degenerative listhesis of L4 on L5. Review of the MIP images confirms the above findings. IMPRESSION: 1. Normal contour and caliber of the thoracic and abdominal aorta. No evidence of aneurysm,  dissection or other acute aortic pathology. Mild aortic atherosclerosis. 2. There is diffuse circumferential thickening of the lower half of the esophagus, which may be related to reflux esophagitis. Malignancy is however difficult to exclude. 3. Grossly cirrhotic morphology of the liver with stigmata of portal hypertension including splenomegaly and gastroesophageal varices. 4. Coronary artery disease. Aortic Atherosclerosis (ICD10-I70.0). Electronically Signed   By: Lauralyn PrimesAlex  Bibbey M.D.   On: 10/10/2020 16:08   Scheduled Meds: . [MAR Hold] enoxaparin (LOVENOX) injection  40 mg Subcutaneous Q24H  . [MAR Hold] ferrous sulfate  325 mg Oral Q breakfast  . [MAR Hold] insulin aspart  0-9 Units Subcutaneous TID WC  . [MAR Hold] multivitamin with minerals  1 tablet Oral Daily  . [MAR Hold] pantoprazole (PROTONIX) IV  40 mg Intravenous Q12H  . [MAR Hold] QUEtiapine  300 mg Oral QHS  . [MAR Hold] sertraline  50 mg Oral Daily  . [MAR Hold] sucralfate  1 g Oral TID WC & HS  . [MAR Hold] traZODone  150 mg Oral QHS   Continuous Infusions: . sodium chloride 100 mL/hr at 10/11/20 1744  . [MAR Hold] cefTRIAXone (ROCEPHIN)  IV 2 g (10/12/20 1330)     LOS: 1 day    Time spent: 25 mins    Paytyn Mesta, MD [facility]   If 7PM-7AM, please contact night-coverage

## 2020-10-13 DIAGNOSIS — D72829 Elevated white blood cell count, unspecified: Secondary | ICD-10-CM | POA: Diagnosis not present

## 2020-10-13 LAB — BASIC METABOLIC PANEL
Anion gap: 8 (ref 5–15)
BUN: 9 mg/dL (ref 6–20)
CO2: 21 mmol/L — ABNORMAL LOW (ref 22–32)
Calcium: 8.1 mg/dL — ABNORMAL LOW (ref 8.9–10.3)
Chloride: 106 mmol/L (ref 98–111)
Creatinine, Ser: 0.78 mg/dL (ref 0.61–1.24)
GFR, Estimated: >60 mL/min (ref >60)
Glucose, Bld: 123 mg/dL — ABNORMAL HIGH (ref 70–99)
Potassium: 3.4 mmol/L — ABNORMAL LOW (ref 3.5–5.1)
Sodium: 135 mmol/L (ref 135–145)

## 2020-10-13 LAB — GLUCOSE, CAPILLARY
Glucose-Capillary: 115 mg/dL — ABNORMAL HIGH (ref 70–99)
Glucose-Capillary: 155 mg/dL — ABNORMAL HIGH (ref 70–99)
Glucose-Capillary: 136 mg/dL — ABNORMAL HIGH (ref 70–99)
Glucose-Capillary: 110 mg/dL — ABNORMAL HIGH (ref 70–99)

## 2020-10-13 LAB — HCV INTERPRETATION

## 2020-10-13 LAB — HCV AB W REFLEX TO QUANT PCR: HCV Ab: 0.2 s/co ratio (ref 0.0–0.9)

## 2020-10-13 MED ORDER — POTASSIUM CHLORIDE 20 MEQ PO PACK
40.0000 meq | PACK | Freq: Once | ORAL | Status: AC
  Administered 2020-10-13: 40 meq via ORAL
  Filled 2020-10-13: qty 2

## 2020-10-13 NOTE — Progress Notes (Signed)
PROGRESS NOTE    Cory Palmer  EAV:[id number] DOB: 04/22/1961 DOA: 10/10/2020 PCP: Patient, [name] Per   Brief Narrative:  This 60 years old male with PMH significant for bilateral BKA, anxiety disorder, bipolar, type II DM, hypertension presents in the ED with significant lower lip edema and odynophagia(pain while swallowing).  Patient lives alone, is able to do his activities of daily living independently.  Patient reports having excruciating burning pain in his throat radiating towards the stomach after drinking orange juice 2 days ago since then he has been experiencing persistent moderate to severe burning pain that gets worse with swallowing.  Patient also reports he has not been able to take his home medication because of the pain.  EMS was called by his neighbor when he went to see him and found him with severe lip swelling.  Patient denies any fall or trauma to the lip or face.  Soft tissue CT neck showed no obstruction,  CT abdomen pelvis without any significant changes.  Patient is found to have severe leukocytosis which could be reactive.  GI consulted, scheduled to have EGD but it was canceled due to patient having significant lower lip swelling.  Recommended barium swallow,  further plan will be decided after MBS.  Assessment & Plan:   Principal Problem:   Leukocytosis Active Problems:   Diabetes mellitus type II, non insulin dependent (HCC)   Depressed bipolar disorder (HCC)   Hypertension   Below-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)   Cirrhosis of liver (HCC)   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HCC)   GERD (gastroesophageal reflux disease)   Odynophagia due to severe esophagitis: Patient reports severe burning pain in the throat radiating towards the stomach after drinking orange juice 2 days ago. He is not able to take his medication because of the pain while swallowing Continue antiacid therapy with sucralfate 4 times daily and twice daily IV  pantoprazole 40 mg daily. Clear liquid diet for now and advance as tolerated. Antiemetic and analgesics iv as needed. GI consulted, recommended EGD to find out severe esophagitis. EGD canceled because patient has significant lower lip swelling, GI recommended MBS, further plan will be decided afterwards.  Lower lip swelling: It appears like traumatic injury or burn, patient denies any fall or injury. Lip swelling is decreasing in size. Warm and cold compresses and ceftriaxone for 5 days.  Leukocytosis /lactic acidosis :> Resolved. His leukocytosis and elevated lactic acid seems to be reactive,  There are no signs of bacterial infection, antibiotics were not given  Blood culture grew Streptococcus, started on ceftriaxone 2 g daily. Monitor WBC trend.  WBC down to 7.4 Continue ceftriaxone for 5 days given lower lip swelling.  Acute kidney injury> Resolved. Likely prerenal, continue hydration with isotonic saline at 100 mill per hour. Follow-up kidney function in the morning, avoid hypotension and nephrotoxic agents.  Type 2 diabetes mellitus.  Hold on Metformin, continue glucose coverage and monitoring with insulin sliding scale.  Iron deficiency anemia.  Cell count remained stable, continue oral iron supplements.  Bipolar/depression.  Continue Seroquel, sertraline and trazodone.   DVT prophylaxis: Lovenox Code Status: Full code Family Communication: No family at bedside Disposition Plan:   Status is: Inpatient  Remains inpatient appropriate because:Inpatient level of care appropriate due to severity of illness   Dispo: The patient is from: Home              Anticipated d/c is to: TBD  Patient currently is not medically stable to d/c.   Difficult to place patient No   Consultants:   Gastroenterology  Procedures: Schedule EGD tomorrow morning  Antimicrobials:    Anti-infectives (From admission, onward)   Start     Dose/Rate Route Frequency Ordered  Stop   10/11/20 1400  cefTRIAXone (ROCEPHIN) 2 g in sodium chloride 0.9 % 100 mL IVPB        2 g 200 mL/hr over 30 Minutes Intravenous Every 24 hours 10/11/20 1233     10/10/20 2055  vancomycin variable dose per unstable renal function (pharmacist dosing)  Status:  Discontinued         Does not apply See admin instructions 10/10/20 2055 10/11/20 1115   10/10/20 1515  vancomycin variable dose per unstable renal function (pharmacist dosing)  Status:  Discontinued         Does not apply See admin instructions 10/10/20 1515 10/10/20 1816   10/10/20 1445  ceFEPIme (MAXIPIME) 2 g in sodium chloride 0.9 % 100 mL IVPB        2 g 200 mL/hr over 30 Minutes Intravenous  Once 10/10/20 1435 10/10/20 1502   10/10/20 1445  vancomycin (VANCOREADY) IVPB 1750 mg/350 mL        1,750 mg 175 mL/hr over 120 Minutes Intravenous  Once 10/10/20 1444 10/10/20 1819         Subjective: Patient was seen and examined at bedside.  Overnight events noted.   Patient reports pain is better while swallowing,  denies any other complaints. Patient denies any difficulty breathing, chest pain, palpitations. Lower lip swelling is improving but his lip still appears swollen.  Objective: Vitals:   10/12/20 1444 10/12/20 2056 10/13/20 0402 10/13/20 1427  BP: 133/67 (!) 129/55 114/65 132/78  Pulse: 94 88 (!) 50 63  Resp: 12 19 18 17   Temp: 98.5 F (36.9 C) 97.6 F (36.4 C) 98 F (36.7 C) 98.6 F (37 C)  TempSrc: Axillary Oral  Oral  SpO2: 97% 94% 97% 99%    Intake/Output Summary (Last 24 hours) at 10/13/2020 1519 Last data filed at 10/13/2020 1025 Gross per 24 hour  Intake 3945.42 ml  Output 1450 ml  Net 2495.42 ml   There were no vitals filed for this visit.  Examination:  General exam: Appears calm and comfortable, not in acute distress.  Lower lip swelling+ Respiratory system: Clear to auscultation. Respiratory effort normal. Cardiovascular system: S1 & S2 heard, RRR. No JVD, murmurs, rubs, gallops or  clicks. No pedal edema. Gastrointestinal system: Abdomen is nondistended, soft and nontender. No organomegaly or masses felt. Normal bowel sounds heard. Central nervous system: Alert and oriented. No focal neurological deficits. Extremities: Bilateral BKA. Skin: No rashes, lesions or ulcers Psychiatry: Judgement and insight appear normal. Mood & affect appropriate.     Data Reviewed: I have personally reviewed following labs and imaging studies  CBC: Recent Labs  Lab 10/10/20 1226 10/10/20 1335 10/11/20 0357 10/12/20 0249  WBC 31.6* 32.4* 15.7* 7.4  NEUTROABS  --  25.3*  --   --   HGB 14.9 14.6 11.9* 10.3*  HCT 43.6 41.2 34.1* 30.1*  MCV 95.2 92.8 94.5 96.5  PLT 203 184 89* 75*   Basic Metabolic Panel: Recent Labs  Lab 10/10/20 1226 10/11/20 0357 10/12/20 0249 10/13/20 0356  NA 134* 133* 133* 135  K 4.1 3.6 3.4* 3.4*  CL 100 105 108 106  CO2 20* 20* 22 21*  GLUCOSE 143* 161* 117* 123*  BUN 33* 28*  13 9  CREATININE 1.36* 0.94 0.73 0.78  CALCIUM 9.4 8.3* 8.0* 8.1*  MG  --   --  1.7  --   PHOS  --   --  2.7  --    GFR: CrCl cannot be calculated (Unknown ideal weight.). Liver Function Tests: Recent Labs  Lab 10/10/20 1226  AST 34  ALT 20  ALKPHOS 86  BILITOT 3.4*  PROT 7.4  ALBUMIN 3.9   Recent Labs  Lab 10/10/20 1226  LIPASE 28   No results for input(s): AMMONIA in the last 168 hours. Coagulation Profile: Recent Labs  Lab 10/11/20 1453  INR 1.4*   Cardiac Enzymes: No results for input(s): CKTOTAL, CKMB, CKMBINDEX, TROPONINI in the last 168 hours. BNP (last 3 results) No results for input(s): PROBNP in the last 8760 hours. HbA1C: No results for input(s): HGBA1C in the last 72 hours. CBG: Recent Labs  Lab 10/12/20 1135 10/12/20 1809 10/12/20 2057 10/13/20 0744 10/13/20 1202  GLUCAP 144* 187* 100* 115* 155*   Lipid Profile: No results for input(s): CHOL, HDL, LDLCALC, TRIG, CHOLHDL, LDLDIRECT in the last 72 hours. Thyroid Function  Tests: No results for input(s): TSH, T4TOTAL, FREET4, T3FREE, THYROIDAB in the last 72 hours. Anemia Panel: No results for input(s): VITAMINB12, FOLATE, FERRITIN, TIBC, IRON, RETICCTPCT in the last 72 hours. Sepsis Labs: Recent Labs  Lab 10/10/20 1335 10/10/20 1601  LATICACIDVEN 2.7* 3.3*    Recent Results (from the past 240 hour(s))  Culture, blood (routine x 2)     Status: Abnormal   Collection Time: 10/10/20  1:49 PM   Specimen: BLOOD  Result Value Ref Range Status   Specimen Description BLOOD SITE NOT SPECIFIED  Final   Special Requests   Final    BOTTLES DRAWN AEROBIC AND ANAEROBIC Blood Culture adequate volume   Culture  Setup Time   Final    GRAM POSITIVE COCCI IN CHAINS AEROBIC BOTTLE ONLY CRITICAL RESULT CALLED TO, READ BACK BY AND VERIFIED WITH: PHARMD G.BARR AT 1222 ON 10/11/2020 BY T.SAAD    Culture (A)  Final    STREPTOCOCCUS MITIS/ORALIS THE SIGNIFICANCE OF ISOLATING THIS ORGANISM FROM A SINGLE SET OF BLOOD CULTURES WHEN MULTIPLE SETS ARE DRAWN IS UNCERTAIN. PLEASE NOTIFY THE MICROBIOLOGY DEPARTMENT WITHIN ONE WEEK IF SPECIATION AND SENSITIVITIES ARE REQUIRED. Performed at [facility], [address], [place], [place] [address]    Report Status 10/13/2020 FINAL  Final  Culture, blood (routine x 2)     Status: None (Preliminary result)   Collection Time: 10/10/20  1:49 PM   Specimen: BLOOD  Result Value Ref Range Status   Specimen Description BLOOD SITE NOT SPECIFIED  Final   Special Requests   Final    BOTTLES DRAWN AEROBIC AND ANAEROBIC Blood Culture adequate volume   Culture   Final    NO GROWTH 3 DAYS Performed at [facility], [address], [place], [place] [address]    Report Status PENDING  Incomplete  Blood Culture ID Panel (Reflexed)     Status: Abnormal   Collection Time: 10/10/20  1:49 PM  Result Value Ref Range Status   Enterococcus faecalis NOT DETECTED NOT DETECTED Final   Enterococcus Faecium NOT DETECTED NOT DETECTED Final    Listeria monocytogenes NOT DETECTED NOT DETECTED Final   Staphylococcus species NOT DETECTED NOT DETECTED Final   Staphylococcus aureus (BCID) NOT DETECTED NOT DETECTED Final   Staphylococcus epidermidis NOT DETECTED NOT DETECTED Final   Staphylococcus lugdunensis NOT DETECTED NOT  DETECTED Final   Streptococcus species DETECTED (A) NOT DETECTED Final    Comment: Not Enterococcus species, Streptococcus agalactiae, Streptococcus pyogenes, or Streptococcus pneumoniae. CRITICAL RESULT CALLED TO, READ BACK BY AND VERIFIED WITH: PHARMD G.BARR AT 1222 ON 10/11/2020 BY T.SAAD    Streptococcus agalactiae NOT DETECTED NOT DETECTED Final   Streptococcus pneumoniae NOT DETECTED NOT DETECTED Final   Streptococcus pyogenes NOT DETECTED NOT DETECTED Final   A.calcoaceticus-baumannii NOT DETECTED NOT DETECTED Final   Bacteroides fragilis NOT DETECTED NOT DETECTED Final   Enterobacterales NOT DETECTED NOT DETECTED Final   Enterobacter cloacae complex NOT DETECTED NOT DETECTED Final   Escherichia coli NOT DETECTED NOT DETECTED Final   Klebsiella aerogenes NOT DETECTED NOT DETECTED Final   Klebsiella oxytoca NOT DETECTED NOT DETECTED Final   Klebsiella pneumoniae NOT DETECTED NOT DETECTED Final   Proteus species NOT DETECTED NOT DETECTED Final   Salmonella species NOT DETECTED NOT DETECTED Final   Serratia marcescens NOT DETECTED NOT DETECTED Final   Haemophilus influenzae NOT DETECTED NOT DETECTED Final   Neisseria meningitidis NOT DETECTED NOT DETECTED Final   Pseudomonas aeruginosa NOT DETECTED NOT DETECTED Final   Stenotrophomonas maltophilia NOT DETECTED NOT DETECTED Final   Candida albicans NOT DETECTED NOT DETECTED Final   Candida auris NOT DETECTED NOT DETECTED Final   Candida glabrata NOT DETECTED NOT DETECTED Final   Candida krusei NOT DETECTED NOT DETECTED Final   Candida parapsilosis NOT DETECTED NOT DETECTED Final   Candida tropicalis NOT DETECTED NOT DETECTED Final   Cryptococcus  neoformans/gattii NOT DETECTED NOT DETECTED Final    Comment: Performed at [facility], [address] [place], [place] [address]  Resp Panel by RT-PCR (Flu A&B, Covid) Nasopharyngeal Swab     Status: None   Collection Time: 10/10/20  2:58 PM   Specimen: Nasopharyngeal Swab; Nasopharyngeal(NP) swabs in vial transport medium  Result Value Ref Range Status   SARS Coronavirus 2 by RT PCR NEGATIVE NEGATIVE Final    Comment: (NOTE) SARS-CoV-2 target nucleic acids are NOT DETECTED.  The SARS-CoV-2 RNA is generally detectable in upper respiratory specimens during the acute phase of infection. The lowest concentration of SARS-CoV-2 viral copies this assay can detect is 138 copies/mL. A negative result does not preclude SARS-Cov-2 infection and should not be used as the sole basis for treatment or other patient management decisions. A negative result may occur with  improper specimen collection/handling, submission of specimen other than nasopharyngeal swab, presence of viral mutation(s) within the areas targeted by this assay, and inadequate number of viral copies(<138 copies/mL). A negative result must be combined with clinical observations, patient history, and epidemiological information. The expected result is Negative.  Fact Sheet for Patients:  BloggerCourse.com  Fact Sheet for Healthcare Providers:  SeriousBroker.it  This test is no t yet approved or cleared by the [place] FDA and  has been authorized for detection and/or diagnosis of SARS-CoV-2 by FDA under an Emergency Use Authorization (EUA). This EUA will remain  in effect (meaning this test can be used) for the duration of the COVID-19 declaration under Section 564(b)(1) of the Act, 21 U.S.C.section 360bbb-3(b)(1), unless the authorization is terminated  or revoked sooner.       Influenza A by PCR NEGATIVE NEGATIVE Final   Influenza B by PCR NEGATIVE NEGATIVE  Final    Comment: (NOTE) The Xpert Xpress SARS-CoV-2/FLU/RSV plus assay is intended as an aid in the diagnosis of influenza from Nasopharyngeal swab specimens and should not be used as  a sole basis for treatment. Nasal washings and aspirates are unacceptable for Xpert Xpress SARS-CoV-2/FLU/RSV testing.  Fact Sheet for Patients: BloggerCourse.com  Fact Sheet for Healthcare Providers: SeriousBroker.it  This test is not yet approved or cleared by the [place] FDA and has been authorized for detection and/or diagnosis of SARS-CoV-2 by FDA under an Emergency Use Authorization (EUA). This EUA will remain in effect (meaning this test can be used) for the duration of the COVID-19 declaration under Section 564(b)(1) of the Act, 21 U.S.C. section 360bbb-3(b)(1), unless the authorization is terminated or revoked.  Performed at [facility], [address], [place], [place] [address]   Group A Strep by PCR     Status: None   Collection Time: 10/10/20  2:58 PM   Specimen: Nasopharyngeal Swab; Sterile Swab  Result Value Ref Range Status   Group A Strep by PCR NOT DETECTED NOT DETECTED Final    Comment: Performed at [facility], [address], [place], [place] [address]  Urine culture     Status: Abnormal   Collection Time: 10/11/20  4:54 AM   Specimen: Urine, Random  Result Value Ref Range Status   Specimen Description URINE, RANDOM  Final   Special Requests   Final    NONE Performed at [facility], [address] [place], [place] [address]    Culture MULTIPLE SPECIES PRESENT, SUGGEST RECOLLECTION (A)  Final   Report Status 10/12/2020 FINAL  Final    Radiology Studies: No results found. Scheduled Meds: . enoxaparin (LOVENOX) injection  40 mg Subcutaneous Q24H  . ferrous sulfate  325 mg Oral Q breakfast  . insulin aspart  0-9 Units Subcutaneous TID WC  . multivitamin with minerals  1 tablet Oral Daily  .  pantoprazole (PROTONIX) IV  40 mg Intravenous Q12H  . QUEtiapine  300 mg Oral QHS  . sertraline  50 mg Oral Daily  . sucralfate  1 g Oral TID WC & HS  . traZODone  150 mg Oral QHS   Continuous Infusions: . sodium chloride 100 mL/hr at 10/13/20 0659  . cefTRIAXone (ROCEPHIN)  IV 2 g (10/13/20 1407)     LOS: 2 days    Time spent: 25 mins    PARDEEP KUMAR, MD [facility]   If 7PM-7AM, please contact night-coverage

## 2020-10-13 NOTE — Progress Notes (Signed)
Subjective: He reports that his odynophagia is improving.  He has mild odynophagia complaints.  Objective: Vital signs in last 24 hours: Temp:  [97.6 F (36.4 C)-98.6 F (37 C)] 98.6 F (37 C) (03/15 1427) Pulse Rate:  [50-88] 63 (03/15 1427) Resp:  [17-19] 17 (03/15 1427) BP: (114-132)/(55-78) 132/78 (03/15 1427) SpO2:  [94 %-99 %] 99 % (03/15 1427) Last BM Date: 10/10/20  Intake/Output from previous day: 03/14 0701 - 03/15 0700 In: 4065.4 [P.O.:1080; I.V.:2885.4; IV Piggyback:100] Out: 650 [Urine:650] Intake/Output this shift: No intake/output data recorded.  General appearance: alert, no distress and markedly enlarged lower lip - see below GI: soft, non-tender; bowel sounds normal; no masses,  no organomegaly  Lab Results: Recent Labs    10/11/20 0357 10/12/20 0249  WBC 15.7* 7.4  HGB 11.9* 10.3*  HCT 34.1* 30.1*  PLT 89* 75*   BMET Recent Labs    10/11/20 0357 10/12/20 0249 10/13/20 0356  NA 133* 133* 135  K 3.6 3.4* 3.4*  CL 105 108 106  CO2 20* 22 21*  GLUCOSE 161* 117* 123*  BUN 28* 13 9  CREATININE 0.94 0.73 0.78  CALCIUM 8.3* 8.0* 8.1*   LFT No results for input(s): PROT, ALBUMIN, AST, ALT, ALKPHOS, BILITOT, BILIDIR, IBILI in the last 72 hours. PT/INR Recent Labs    10/11/20 1453  LABPROT 16.5*  INR 1.4*   Hepatitis Panel Recent Labs    10/12/20 0249  HEPBSAG NON REACTIVE   C-Diff No results for input(s): CDIFFTOX in the last 72 hours. Fecal Lactopherrin No results for input(s): FECLLACTOFRN in the last 72 hours.  Studies/Results: No results found.  Medications:  Scheduled: . enoxaparin (LOVENOX) injection  40 mg Subcutaneous Q24H  . ferrous sulfate  325 mg Oral Q breakfast  . insulin aspart  0-9 Units Subcutaneous TID WC  . multivitamin with minerals  1 tablet Oral Daily  . pantoprazole (PROTONIX) IV  40 mg Intravenous Q12H  . QUEtiapine  300 mg Oral QHS  . sertraline  50 mg Oral Daily  . sucralfate  1 g Oral TID WC & HS  .  traZODone  150 mg Oral QHS   Continuous: . sodium chloride 100 mL/hr at 10/13/20 1740  . cefTRIAXone (ROCEPHIN)  IV 2 g (10/13/20 1407)    Assessment/Plan: 1) Thickened distal esophagus. 2) Improved odynophagia. 3) Swollen lower lip - ? Trauma or infection.   His esophageal symptoms are much better per his report.  He is able to swallow with mild esophagitis.  It is presumed that his thickened distal esophagus is secondary to a reflux esophagitis, but further evaluation with an EGD to exclude any malignant issue is required.  The EGD cannot be performed as his lower lip is markedly swollen.  The bite block used to protect the endoscope and patient's mouth would entrap the lower lip. This may possibly worsen the situation.  There is a left lateral ulceration.  The etiology is unknown and he is being treated with warm compresses and ceftriaxone.    Plan: 1) Continue with PPI and sucralfate.  Convert the patient from IV pantoprazole to oral. 2) Call back when the patient's lip has normalized or is near normal.  At that time an EGD can be performed.  This procedure can also be performed as an outpatient.  LOS: 2 days   Dewayne Jurek D 10/13/2020, 7:46 PM

## 2020-10-14 DIAGNOSIS — S88111A Complete traumatic amputation at level between knee and ankle, right lower leg, initial encounter: Secondary | ICD-10-CM | POA: Diagnosis not present

## 2020-10-14 DIAGNOSIS — K209 Esophagitis, unspecified without bleeding: Secondary | ICD-10-CM

## 2020-10-14 DIAGNOSIS — S88112A Complete traumatic amputation at level between knee and ankle, left lower leg, initial encounter: Secondary | ICD-10-CM | POA: Diagnosis not present

## 2020-10-14 DIAGNOSIS — D72829 Elevated white blood cell count, unspecified: Secondary | ICD-10-CM | POA: Diagnosis not present

## 2020-10-14 LAB — CBC
HCT: 28.9 % — ABNORMAL LOW (ref 39.0–52.0)
Hemoglobin: 10.5 g/dL — ABNORMAL LOW (ref 13.0–17.0)
MCH: 34.1 pg — ABNORMAL HIGH (ref 26.0–34.0)
MCHC: 36.3 g/dL — ABNORMAL HIGH (ref 30.0–36.0)
MCV: 93.8 fL (ref 80.0–100.0)
Platelets: 83 10*3/uL — ABNORMAL LOW (ref 150–400)
RBC: 3.08 MIL/uL — ABNORMAL LOW (ref 4.22–5.81)
RDW: 13.7 % (ref 11.5–15.5)
WBC: 7.9 10*3/uL (ref 4.0–10.5)
nRBC: 0 % (ref 0.0–0.2)

## 2020-10-14 LAB — BASIC METABOLIC PANEL
Anion gap: 9 (ref 5–15)
BUN: 5 mg/dL — ABNORMAL LOW (ref 6–20)
CO2: 20 mmol/L — ABNORMAL LOW (ref 22–32)
Calcium: 8.3 mg/dL — ABNORMAL LOW (ref 8.9–10.3)
Chloride: 109 mmol/L (ref 98–111)
Creatinine, Ser: 0.71 mg/dL (ref 0.61–1.24)
GFR, Estimated: >60 mL/min (ref >60)
Glucose, Bld: 119 mg/dL — ABNORMAL HIGH (ref 70–99)
Potassium: 3.4 mmol/L — ABNORMAL LOW (ref 3.5–5.1)
Sodium: 138 mmol/L (ref 135–145)

## 2020-10-14 LAB — CULTURE, BLOOD (ROUTINE X 2): Special Requests: ADEQUATE

## 2020-10-14 LAB — GLUCOSE, CAPILLARY
Glucose-Capillary: 135 mg/dL — ABNORMAL HIGH (ref 70–99)
Glucose-Capillary: 161 mg/dL — ABNORMAL HIGH (ref 70–99)
Glucose-Capillary: 130 mg/dL — ABNORMAL HIGH (ref 70–99)
Glucose-Capillary: 149 mg/dL — ABNORMAL HIGH (ref 70–99)

## 2020-10-14 MED ORDER — WHITE PETROLATUM EX OINT
TOPICAL_OINTMENT | CUTANEOUS | Status: AC
  Administered 2020-10-14: 0.2
  Filled 2020-10-14: qty 28.35

## 2020-10-14 NOTE — Progress Notes (Signed)
SLP Cancellation Note  Patient Details Name: Cory Palmer MRN: [id number] DOB: 1961/06/09   Cancelled treatment:       Reason Eval/Treat Not Completed: Other (comment). Incorrect order. Pt recommended to have a barium swallow by GI. At some point MD ordered Modified barium swallow, which is not an esophageal assessment. Will discontinue orders as GI is managing pts esophageal dysphagia.    Alban Marucci, Riley Nearing 10/14/2020, 8:50 AM

## 2020-10-14 NOTE — Progress Notes (Addendum)
PROGRESS NOTE    AHNAF CAPONI  QPR:[id number] DOB: 05-Jun-1961 DOA: 10/10/2020 PCP: Patient, [name] Per    Chief Complaint  Patient presents with  . Abdominal Pain  . Chest Pain  . Oral Swelling    Brief Narrative:  This 60 years old male with PMH significant for bilateral BKA, anxiety disorder, bipolar, type II DM, hypertension presents in the ED with significant lower lip edema and odynophagia(pain while swallowing).  Patient lives alone, is able to do his activities of daily living independently.  Patient reports having excruciating burning pain in his throat radiating towards the stomach after drinking orange juice 2 days ago since then he has been experiencing persistent moderate to severe burning pain that gets worse with swallowing.  Patient also reports he has not been able to take his home medication because of the pain.  EMS was called by his neighbor when he went to see him and found him with severe lip swelling.  Patient denies any fall or trauma to the lip or face.  Soft tissue CT neck showed no obstruction,  CT abdomen pelvis without any significant changes.  Patient is found to have severe leukocytosis which could be reactive.  GI consulted, scheduled to have EGD but it was canceled due to patient having significant lower lip swelling.    Subjective:  Reports lower lip swalling is improving, now able to open mouth better He wants to eat more  Assessment & Plan:   Principal Problem:   Leukocytosis Active Problems:   Diabetes mellitus type II, non insulin dependent (HCC)   Depressed bipolar disorder (HCC)   Hypertension   Below-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)   Cirrhosis of liver (HCC)   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HCC)   GERD (gastroesophageal reflux disease)  Odynophagia due to severe esophagitis -Patient reports severe burning pain in the throat radiating towards the stomach after drinking orange juice 2 days ago. He is not able  to take his medication because of the pain while swallowing Continue antiacid therapy with sucralfate 4 times daily and twice daily IV pantoprazole 40 mg daily. GI consulted, patient would benefit from EGD however not able to perform due to significant lower lip swelling I have discussed with GI Dr. Elnoria Howard who did not think DG esophagus will change management, cancel DG esophagus  Lower lip swelling -He denies trauma or fall Denies allergy -Report improving Continue compress and ceftriaxone  check C1 esterase inhibitor level  Leukocytosis/lactic acidosis /AKI/hyponatremia Resolved after hydration  Hypokalemia, replace, check mag  Thrombocytopenia Unclear etiology CT ab dose show "Grossly cirrhotic morphology of the liver with stigmata of portal hypertension including splenomegaly and gastroesophageal varices" No sign of bleeding Check B12 folate Monitor CBC  Noninsulin-dependent type 2 diabetes, controlled A1c 6.6 Home medication Metformin held On SSI  Bilateral BKA Report lives by self and use wheelchair    Unresulted Labs (From admission, onward)          Start     Ordered   10/17/20 0500  Creatinine, serum  (enoxaparin (LOVENOX)    CrCl >/= 30 ml/min)  Weekly,   R     Comments: while on enoxaparin therapy    10/10/20 1816   10/15/20 0500  C1 Esterase Inhibitor  Tomorrow morning,   R        10/14/20 2208   10/15/20 0500  CBC with Differential/Platelet  Tomorrow morning,   R        10/14/20 2212  10/15/20 0500  Vitamin B12  Tomorrow morning,   R        10/14/20 2212   10/15/20 0500  Folate, serum, performed at [facility] lab  Tomorrow morning,   R        10/14/20 2212   10/15/20 0500  Comprehensive metabolic panel  Tomorrow morning,   R        10/14/20 2212   10/15/20 0500  Magnesium  Tomorrow morning,   R        10/14/20 2212            DVT prophylaxis: enoxaparin (LOVENOX) injection 40 mg Start: 10/10/20 1915   Code Status: Full Family  Communication: Patient Disposition:   Status is: Inpatient  Dispo: The patient is from: Home              Anticipated d/c is to: To be determined              Anticipated d/c date is: To be determined                Consultants:   GI Dr. Elnoria Howard  Procedures:   None  Antimicrobials:   Anti-infectives (From admission, onward)   Start     Dose/Rate Route Frequency Ordered Stop   10/11/20 1400  cefTRIAXone (ROCEPHIN) 2 g in sodium chloride 0.9 % 100 mL IVPB        2 g 200 mL/hr over 30 Minutes Intravenous Every 24 hours 10/11/20 1233 10/16/20 1359   10/10/20 2055  vancomycin variable dose per unstable renal function (pharmacist dosing)  Status:  Discontinued         Does not apply See admin instructions 10/10/20 2055 10/11/20 1115   10/10/20 1515  vancomycin variable dose per unstable renal function (pharmacist dosing)  Status:  Discontinued         Does not apply See admin instructions 10/10/20 1515 10/10/20 1816   10/10/20 1445  ceFEPIme (MAXIPIME) 2 g in sodium chloride 0.9 % 100 mL IVPB        2 g 200 mL/hr over 30 Minutes Intravenous  Once 10/10/20 1435 10/10/20 1502   10/10/20 1445  vancomycin (VANCOREADY) IVPB 1750 mg/350 mL        1,750 mg 175 mL/hr over 120 Minutes Intravenous  Once 10/10/20 1444 10/10/20 1819          Objective: Vitals:   10/13/20 2040 10/14/20 0533 10/14/20 1414 10/14/20 2059  BP: (!) 151/88 124/63 139/80 (!) 161/92  Pulse: 66 (!) 55 (!) 54 71  Resp: 18 17 16 17   Temp: 98 F (36.7 C) 98 F (36.7 C) 98.2 F (36.8 C) 98.1 F (36.7 C)  TempSrc:   Oral   SpO2: 96% 96% 96% 97%    Intake/Output Summary (Last 24 hours) at 10/14/2020 2213 Last data filed at 10/14/2020 1725 Gross per 24 hour  Intake 2729.11 ml  Output 1000 ml  Net 1729.11 ml   There were no vitals filed for this visit.  Examination:  General exam: Lower lip swelling , calm, NAD Respiratory system: Clear to auscultation. Respiratory effort normal. Cardiovascular system: S1  & S2 heard, RRR. No JVD, no murmur, No pedal edema. Gastrointestinal system: Abdomen is nondistended, soft and nontender.  Normal bowel sounds heard. Central nervous system: Alert and oriented. No focal neurological deficits. Extremities: bilateral aka Skin: No rashes, lesions or ulcers Psychiatry: Judgement and insight appear normal. Mood & affect appropriate.       Data  Reviewed: I have personally reviewed following labs and imaging studies  CBC: Recent Labs  Lab 10/10/20 1226 10/10/20 1335 10/11/20 0357 10/12/20 0249 10/14/20 0238  WBC 31.6* 32.4* 15.7* 7.4 7.9  NEUTROABS  --  25.3*  --   --   --   HGB 14.9 14.6 11.9* 10.3* 10.5*  HCT 43.6 41.2 34.1* 30.1* 28.9*  MCV 95.2 92.8 94.5 96.5 93.8  PLT 203 184 89* 75* 83*    Basic Metabolic Panel: Recent Labs  Lab 10/10/20 1226 10/11/20 0357 10/12/20 0249 10/13/20 0356 10/14/20 0238  NA 134* 133* 133* 135 138  K 4.1 3.6 3.4* 3.4* 3.4*  CL 100 105 108 106 109  CO2 20* 20* 22 21* 20*  GLUCOSE 143* 161* 117* 123* 119*  BUN 33* 28* 13 9 5*  CREATININE 1.36* 0.94 0.73 0.78 0.71  CALCIUM 9.4 8.3* 8.0* 8.1* 8.3*  MG  --   --  1.7  --   --   PHOS  --   --  2.7  --   --     GFR: CrCl cannot be calculated (Unknown ideal weight.).  Liver Function Tests: Recent Labs  Lab 10/10/20 1226  AST 34  ALT 20  ALKPHOS 86  BILITOT 3.4*  PROT 7.4  ALBUMIN 3.9    CBG: Recent Labs  Lab 10/13/20 2040 10/14/20 0729 10/14/20 1225 10/14/20 1720 10/14/20 2057  GLUCAP 110* 135* 161* 130* 149*     Recent Results (from the past 240 hour(s))  Culture, blood (routine x 2)     Status: Abnormal   Collection Time: 10/10/20  1:49 PM   Specimen: BLOOD  Result Value Ref Range Status   Specimen Description BLOOD SITE NOT SPECIFIED  Final   Special Requests   Final    BOTTLES DRAWN AEROBIC AND ANAEROBIC Blood Culture adequate volume   Culture  Setup Time   Final    GRAM POSITIVE COCCI IN CHAINS AEROBIC BOTTLE ONLY CRITICAL  RESULT CALLED TO, READ BACK BY AND VERIFIED WITH: PHARMD G.BARR AT 1222 ON 10/11/2020 BY T.SAAD    Culture (A)  Final    STREPTOCOCCUS MITIS/ORALIS THE SIGNIFICANCE OF ISOLATING THIS ORGANISM FROM A SINGLE SET OF BLOOD CULTURES WHEN MULTIPLE SETS ARE DRAWN IS UNCERTAIN. PLEASE NOTIFY THE MICROBIOLOGY DEPARTMENT WITHIN ONE WEEK IF SPECIATION AND SENSITIVITIES ARE REQUIRED. Performed at [facility], [address] [place], [place] [address]    Report Status 10/14/2020 FINAL  Final  Culture, blood (routine x 2)     Status: None (Preliminary result)   Collection Time: 10/10/20  1:49 PM   Specimen: BLOOD  Result Value Ref Range Status   Specimen Description BLOOD SITE NOT SPECIFIED  Final   Special Requests   Final    BOTTLES DRAWN AEROBIC AND ANAEROBIC Blood Culture adequate volume   Culture   Final    NO GROWTH 4 DAYS Performed at [facility], [address], [place], [place] [address]    Report Status PENDING  Incomplete  Blood Culture ID Panel (Reflexed)     Status: Abnormal   Collection Time: 10/10/20  1:49 PM  Result Value Ref Range Status   Enterococcus faecalis NOT DETECTED NOT DETECTED Final   Enterococcus Faecium NOT DETECTED NOT DETECTED Final   Listeria monocytogenes NOT DETECTED NOT DETECTED Final   Staphylococcus species NOT DETECTED NOT DETECTED Final   Staphylococcus aureus (BCID) NOT DETECTED NOT DETECTED Final   Staphylococcus epidermidis NOT DETECTED NOT DETECTED Final  Staphylococcus lugdunensis NOT DETECTED NOT DETECTED Final   Streptococcus species DETECTED (A) NOT DETECTED Final    Comment: Not Enterococcus species, Streptococcus agalactiae, Streptococcus pyogenes, or Streptococcus pneumoniae. CRITICAL RESULT CALLED TO, READ BACK BY AND VERIFIED WITH: PHARMD G.BARR AT 1222 ON 10/11/2020 BY T.SAAD    Streptococcus agalactiae NOT DETECTED NOT DETECTED Final   Streptococcus pneumoniae NOT DETECTED NOT DETECTED Final   Streptococcus pyogenes NOT  DETECTED NOT DETECTED Final   A.calcoaceticus-baumannii NOT DETECTED NOT DETECTED Final   Bacteroides fragilis NOT DETECTED NOT DETECTED Final   Enterobacterales NOT DETECTED NOT DETECTED Final   Enterobacter cloacae complex NOT DETECTED NOT DETECTED Final   Escherichia coli NOT DETECTED NOT DETECTED Final   Klebsiella aerogenes NOT DETECTED NOT DETECTED Final   Klebsiella oxytoca NOT DETECTED NOT DETECTED Final   Klebsiella pneumoniae NOT DETECTED NOT DETECTED Final   Proteus species NOT DETECTED NOT DETECTED Final   Salmonella species NOT DETECTED NOT DETECTED Final   Serratia marcescens NOT DETECTED NOT DETECTED Final   Haemophilus influenzae NOT DETECTED NOT DETECTED Final   Neisseria meningitidis NOT DETECTED NOT DETECTED Final   Pseudomonas aeruginosa NOT DETECTED NOT DETECTED Final   Stenotrophomonas maltophilia NOT DETECTED NOT DETECTED Final   Candida albicans NOT DETECTED NOT DETECTED Final   Candida auris NOT DETECTED NOT DETECTED Final   Candida glabrata NOT DETECTED NOT DETECTED Final   Candida krusei NOT DETECTED NOT DETECTED Final   Candida parapsilosis NOT DETECTED NOT DETECTED Final   Candida tropicalis NOT DETECTED NOT DETECTED Final   Cryptococcus neoformans/gattii NOT DETECTED NOT DETECTED Final    Comment: Performed at [facility], [address] [place], [place] [address]  Resp Panel by RT-PCR (Flu A&B, Covid) Nasopharyngeal Swab     Status: None   Collection Time: 10/10/20  2:58 PM   Specimen: Nasopharyngeal Swab; Nasopharyngeal(NP) swabs in vial transport medium  Result Value Ref Range Status   SARS Coronavirus 2 by RT PCR NEGATIVE NEGATIVE Final    Comment: (NOTE) SARS-CoV-2 target nucleic acids are NOT DETECTED.  The SARS-CoV-2 RNA is generally detectable in upper respiratory specimens during the acute phase of infection. The lowest concentration of SARS-CoV-2 viral copies this assay can detect is 138 copies/mL. A negative result does not preclude  SARS-Cov-2 infection and should not be used as the sole basis for treatment or other patient management decisions. A negative result may occur with  improper specimen collection/handling, submission of specimen other than nasopharyngeal swab, presence of viral mutation(s) within the areas targeted by this assay, and inadequate number of viral copies(<138 copies/mL). A negative result must be combined with clinical observations, patient history, and epidemiological information. The expected result is Negative.  Fact Sheet for Patients:  BloggerCourse.com  Fact Sheet for Healthcare Providers:  SeriousBroker.it  This test is no t yet approved or cleared by the [place] FDA and  has been authorized for detection and/or diagnosis of SARS-CoV-2 by FDA under an Emergency Use Authorization (EUA). This EUA will remain  in effect (meaning this test can be used) for the duration of the COVID-19 declaration under Section 564(b)(1) of the Act, 21 U.S.C.section 360bbb-3(b)(1), unless the authorization is terminated  or revoked sooner.       Influenza A by PCR NEGATIVE NEGATIVE Final   Influenza B by PCR NEGATIVE NEGATIVE Final    Comment: (NOTE) The Xpert Xpress SARS-CoV-2/FLU/RSV plus assay is intended as an aid in the diagnosis of influenza from Nasopharyngeal swab specimens and  should not be used as a sole basis for treatment. Nasal washings and aspirates are unacceptable for Xpert Xpress SARS-CoV-2/FLU/RSV testing.  Fact Sheet for Patients: BloggerCourse.comhttps://www.fda.gov/media/152166/download  Fact Sheet for Healthcare Providers: SeriousBroker.ithttps://www.fda.gov/media/152162/download  This test is not yet approved or cleared by the [place] FDA and has been authorized for detection and/or diagnosis of SARS-CoV-2 by FDA under an Emergency Use Authorization (EUA). This EUA will remain in effect (meaning this test can be used) for the duration of  the COVID-19 declaration under Section 564(b)(1) of the Act, 21 U.S.C. section 360bbb-3(b)(1), unless the authorization is terminated or revoked.  Performed at [facility], [address] [place], [place] [address]   Group A Strep by PCR     Status: None   Collection Time: 10/10/20  2:58 PM   Specimen: Nasopharyngeal Swab; Sterile Swab  Result Value Ref Range Status   Group A Strep by PCR NOT DETECTED NOT DETECTED Final    Comment: Performed at [facility], [address] [place], [place] [address]  Urine culture     Status: Abnormal   Collection Time: 10/11/20  4:54 AM   Specimen: Urine, Random  Result Value Ref Range Status   Specimen Description URINE, RANDOM  Final   Special Requests   Final    NONE Performed at [facility], [address] [place], [place] [address]    Culture MULTIPLE SPECIES PRESENT, SUGGEST RECOLLECTION (A)  Final   Report Status 10/12/2020 FINAL  Final         Radiology Studies: No results found.      Scheduled Meds: . enoxaparin (LOVENOX) injection  40 mg Subcutaneous Q24H  . ferrous sulfate  325 mg Oral Q breakfast  . insulin aspart  0-9 Units Subcutaneous TID WC  . multivitamin with minerals  1 tablet Oral Daily  . pantoprazole (PROTONIX) IV  40 mg Intravenous Q12H  . QUEtiapine  300 mg Oral QHS  . sertraline  50 mg Oral Daily  . sucralfate  1 g Oral TID WC & HS  . traZODone  150 mg Oral QHS   Continuous Infusions: . cefTRIAXone (ROCEPHIN)  IV 2 g (10/14/20 1303)     LOS: 3 days   Time spent: 25mins Greater than 50% of this time was spent in counseling, explanation of diagnosis, planning of further management, and coordination of care.   Voice Recognition Reubin Milan/Dragon dictation system was used to create this note, attempts have been made to correct errors. Please contact the author with questions and/or clarifications.   Albertine GratesFang Sholonda Jobst, MD PhD FACP [facility]  Available via Epic secure chat 7am-7pm for  nonurgent issues Please page for urgent issues To page the attending provider between 7A-7P or the covering provider during after hours 7P-7A, please log into the web site [email or web address] and access using universal [place] password for that web site. If you do not have the password, please call the hospital operator.    10/14/2020, 10:13 PM

## 2020-10-14 NOTE — Care Management Important Message (Signed)
Important Message  Patient Details  Name: Cory Palmer MRN: [id number] Date of Birth: 04-24-1961   Medicare Important Message Given:  Yes     Mega Kinkade Stefan Church 10/14/2020, 3:17 PM

## 2020-10-15 DIAGNOSIS — S88112A Complete traumatic amputation at level between knee and ankle, left lower leg, initial encounter: Secondary | ICD-10-CM | POA: Diagnosis not present

## 2020-10-15 DIAGNOSIS — S88111A Complete traumatic amputation at level between knee and ankle, right lower leg, initial encounter: Secondary | ICD-10-CM | POA: Diagnosis not present

## 2020-10-15 DIAGNOSIS — D72829 Elevated white blood cell count, unspecified: Secondary | ICD-10-CM | POA: Diagnosis not present

## 2020-10-15 DIAGNOSIS — K209 Esophagitis, unspecified without bleeding: Secondary | ICD-10-CM | POA: Diagnosis not present

## 2020-10-15 LAB — CBC WITH DIFFERENTIAL/PLATELET
Abs Immature Granulocytes: 0.09 10*3/uL — ABNORMAL HIGH (ref 0.00–0.07)
Basophils Absolute: 0 10*3/uL (ref 0.0–0.1)
Basophils Relative: 1 %
Eosinophils Absolute: 0.2 10*3/uL (ref 0.0–0.5)
Eosinophils Relative: 4 %
HCT: 30.4 % — ABNORMAL LOW (ref 39.0–52.0)
Hemoglobin: 10.7 g/dL — ABNORMAL LOW (ref 13.0–17.0)
Immature Granulocytes: 1 %
Lymphocytes Relative: 20 %
Lymphs Abs: 1.3 10*3/uL (ref 0.7–4.0)
MCH: 33 pg (ref 26.0–34.0)
MCHC: 35.2 g/dL (ref 30.0–36.0)
MCV: 93.8 fL (ref 80.0–100.0)
Monocytes Absolute: 0.6 10*3/uL (ref 0.1–1.0)
Monocytes Relative: 9 %
Neutro Abs: 4.3 10*3/uL (ref 1.7–7.7)
Neutrophils Relative %: 65 %
Platelets: 89 10*3/uL — ABNORMAL LOW (ref 150–400)
RBC: 3.24 MIL/uL — ABNORMAL LOW (ref 4.22–5.81)
RDW: 13.4 % (ref 11.5–15.5)
WBC: 6.6 10*3/uL (ref 4.0–10.5)
nRBC: 0 % (ref 0.0–0.2)

## 2020-10-15 LAB — COMPREHENSIVE METABOLIC PANEL
ALT: 15 U/L (ref 0–44)
AST: 21 U/L (ref 15–41)
Albumin: 2.7 g/dL — ABNORMAL LOW (ref 3.5–5.0)
Alkaline Phosphatase: 89 U/L (ref 38–126)
Anion gap: 11 (ref 5–15)
BUN: 6 mg/dL (ref 6–20)
CO2: 20 mmol/L — ABNORMAL LOW (ref 22–32)
Calcium: 8.5 mg/dL — ABNORMAL LOW (ref 8.9–10.3)
Chloride: 108 mmol/L (ref 98–111)
Creatinine, Ser: 0.72 mg/dL (ref 0.61–1.24)
GFR, Estimated: >60 mL/min (ref >60)
Glucose, Bld: 103 mg/dL — ABNORMAL HIGH (ref 70–99)
Potassium: 3.3 mmol/L — ABNORMAL LOW (ref 3.5–5.1)
Sodium: 139 mmol/L (ref 135–145)
Total Bilirubin: 1.2 mg/dL (ref 0.3–1.2)
Total Protein: 5.7 g/dL — ABNORMAL LOW (ref 6.5–8.1)

## 2020-10-15 LAB — GLUCOSE, CAPILLARY
Glucose-Capillary: 149 mg/dL — ABNORMAL HIGH (ref 70–99)
Glucose-Capillary: 208 mg/dL — ABNORMAL HIGH (ref 70–99)
Glucose-Capillary: 156 mg/dL — ABNORMAL HIGH (ref 70–99)
Glucose-Capillary: 261 mg/dL — ABNORMAL HIGH (ref 70–99)

## 2020-10-15 LAB — CULTURE, BLOOD (ROUTINE X 2)
Culture: NO GROWTH
Special Requests: ADEQUATE

## 2020-10-15 LAB — MAGNESIUM: Magnesium: 1.5 mg/dL — ABNORMAL LOW (ref 1.7–2.4)

## 2020-10-15 LAB — FOLATE: Folate: 29.9 ng/mL (ref >5.9)

## 2020-10-15 LAB — VITAMIN B12: Vitamin B-12: 772 pg/mL (ref 180–914)

## 2020-10-15 MED ORDER — POTASSIUM CHLORIDE CRYS ER 20 MEQ PO TBCR
40.0000 meq | EXTENDED_RELEASE_TABLET | Freq: Once | ORAL | Status: AC
  Administered 2020-10-15: 40 meq via ORAL
  Filled 2020-10-15: qty 2

## 2020-10-15 MED ORDER — MAGNESIUM SULFATE 2 GM/50ML IV SOLN
2.0000 g | Freq: Once | INTRAVENOUS | Status: AC
  Administered 2020-10-15: 2 g via INTRAVENOUS
  Filled 2020-10-15: qty 50

## 2020-10-15 MED ORDER — SENNOSIDES-DOCUSATE SODIUM 8.6-50 MG PO TABS
1.0000 | ORAL_TABLET | Freq: Two times a day (BID) | ORAL | Status: DC
  Administered 2020-10-15 – 2020-10-20 (×9): 1 via ORAL
  Filled 2020-10-15 (×10): qty 1

## 2020-10-15 MED ORDER — DEXAMETHASONE SODIUM PHOSPHATE 10 MG/ML IJ SOLN
10.0000 mg | Freq: Two times a day (BID) | INTRAMUSCULAR | Status: DC
  Administered 2020-10-15 – 2020-10-18 (×7): 10 mg via INTRAVENOUS
  Filled 2020-10-15 (×7): qty 1

## 2020-10-15 NOTE — Progress Notes (Signed)
PROGRESS NOTE    Cory Palmer  YTK:[id number] DOB: 09/16/1960 DOA: 10/10/2020 PCP: Patient, [name] Per    Chief Complaint  Patient presents with  . Abdominal Pain  . Chest Pain  . Oral Swelling    Brief Narrative:  This 60 years old male with PMH significant for bilateral BKA, anxiety disorder, bipolar, type II DM, hypertension presents in the ED with significant lower lip edema and odynophagia(pain while swallowing).  Patient lives alone, is able to do his activities of daily living independently.  Patient reports having excruciating burning pain in his throat radiating towards the stomach after drinking orange juice 2 days ago since then he has been experiencing persistent moderate to severe burning pain that gets worse with swallowing.  Patient also reports he has not been able to take his home medication because of the pain.  EMS was called by his neighbor when he went to see him and found him with severe lip swelling.  Patient denies any fall or trauma to the lip or face.  Soft tissue CT neck showed no obstruction,  CT abdomen pelvis without any significant changes.  Patient is found to have severe leukocytosis which could be reactive.  GI consulted, scheduled to have EGD but it was canceled due to patient having significant lower lip swelling.    Subjective:  Reports lower lip swalling is improving, now able to open mouth better States odynophagia is better, He wants to eat more  Addendum, he did not tolerate diet advancement, started to have odynophagia with soft diet, diet backed down to clears for now  Assessment & Plan:   Principal Problem:   Leukocytosis Active Problems:   Diabetes mellitus type II, non insulin dependent (HCC)   Depressed bipolar disorder (HCC)   Hypertension   Below-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)   Cirrhosis of liver (HCC)   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HCC)   GERD (gastroesophageal reflux disease)  Odynophagia due  to severe esophagitis -Patient reports severe burning pain in the throat radiating towards the stomach after drinking orange juice 2 days ago and not able to take his medication due to pain while swallowing. -Continue antiacid therapy with sucralfate 4 times daily and twice daily IV pantoprazole 40 mg daily. -GI consulted, patient would benefit from EGD however not able to perform due to significant lower lip swelling I have discussed with GI Dr. Elnoria Howard who did not think DG esophagus will change management, cancel DG esophagus  Lower lip swelling -He denies trauma or fall -Denies allergy -reports lower teeth pulled  Three weeks ago without issues -He received vancx1, then ceftriaxone for 5 days - check C1 esterase inhibitor level -lower lips remains significant swelling, case discussed with ENT Dr Suszanne Conners who recommend clindamycin and decadron, Dr Suszanne Conners will see patient in consult  Leukocytosis/lactic acidosis /AKI/hyponatremia Resolved after hydration/abx  Hypokalemia/hypomagnesemia, remain low , continue to replace   Thrombocytopenia Unclear etiology CT ab dose show "Grossly cirrhotic morphology of the liver with stigmata of portal hypertension including splenomegaly and gastroesophageal varices" No sign of bleeding B12 folate unremarkable Monitor CBC  Noninsulin-dependent type 2 diabetes, controlled A1c 6.6 Home medication Metformin held On SSI  Bilateral BKA Report lives by self and use wheelchair    Unresulted Labs (From admission, onward)          Start     Ordered   10/17/20 0500  Creatinine, serum  (enoxaparin (LOVENOX)    CrCl >/= 30 ml/min)  Weekly,  R     Comments: while on enoxaparin therapy    10/10/20 1816   10/16/20 0500  Basic metabolic panel  Daily,   R      10/15/20 1435   10/16/20 0500  Magnesium  Tomorrow morning,   R        10/15/20 1435   10/15/20 0500  C1 Esterase Inhibitor  Tomorrow morning,   R        10/14/20 2208            DVT  prophylaxis: enoxaparin (LOVENOX) injection 40 mg Start: 10/10/20 1915   Code Status: Full Family Communication: Patient Disposition:   Status is: Inpatient  Dispo: The patient is from: Home              Anticipated d/c is to: To be determined              Anticipated d/c date is: To be determined                Consultants:   GI Dr. Elnoria Howard  ENT Dr Suszanne Conners  Procedures:   None  Antimicrobials:   Anti-infectives (From admission, onward)   Start     Dose/Rate Route Frequency Ordered Stop   10/11/20 1400  cefTRIAXone (ROCEPHIN) 2 g in sodium chloride 0.9 % 100 mL IVPB        2 g 200 mL/hr over 30 Minutes Intravenous Every 24 hours 10/11/20 1233 10/15/20 1353   10/10/20 2055  vancomycin variable dose per unstable renal function (pharmacist dosing)  Status:  Discontinued         Does not apply See admin instructions 10/10/20 2055 10/11/20 1115   10/10/20 1515  vancomycin variable dose per unstable renal function (pharmacist dosing)  Status:  Discontinued         Does not apply See admin instructions 10/10/20 1515 10/10/20 1816   10/10/20 1445  ceFEPIme (MAXIPIME) 2 g in sodium chloride 0.9 % 100 mL IVPB        2 g 200 mL/hr over 30 Minutes Intravenous  Once 10/10/20 1435 10/10/20 1502   10/10/20 1445  vancomycin (VANCOREADY) IVPB 1750 mg/350 mL        1,750 mg 175 mL/hr over 120 Minutes Intravenous  Once 10/10/20 1444 10/10/20 1819          Objective: Vitals:   10/14/20 0533 10/14/20 1414 10/14/20 2059 10/15/20 0413  BP: 124/63 139/80 (!) 161/92 (!) 144/83  Pulse: (!) 55 (!) 54 71 63  Resp: Temp: 98 F (36.7 C) 98.2 F (36.8 C) 98.1 F (36.7 C) 97.9 F (36.6 C)  TempSrc:  Oral  Oral  SpO2: 96% 96% 97% 98%    Intake/Output Summary (Last 24 hours) at 10/15/2020 1435 Last data filed at 10/15/2020 0800 Gross per 24 hour  Intake 811.42 ml  Output --  Net 811.42 ml   There were no vitals filed for this visit.  Examination:  General exam: Lower lip  swelling , calm, NAD Respiratory system: Clear to auscultation. Respiratory effort normal. Cardiovascular system: S1 & S2 heard, RRR. No JVD, no murmur, No pedal edema. Gastrointestinal system: Abdomen is nondistended, soft and nontender.  Normal bowel sounds heard. Central nervous system: Alert and oriented. No focal neurological deficits. Extremities: bilateral aka Skin: No rashes, lesions or ulcers Psychiatry: Judgement and insight appear normal. Mood & affect appropriate.         Data Reviewed: I have personally reviewed following labs  and imaging studies  CBC: Recent Labs  Lab 10/10/20 1335 10/11/20 0357 10/12/20 0249 10/14/20 0238 10/15/20 0125  WBC 32.4* 15.7* 7.4 7.9 6.6  NEUTROABS 25.3*  --   --   --  4.3  HGB 14.6 11.9* 10.3* 10.5* 10.7*  HCT 41.2 34.1* 30.1* 28.9* 30.4*  MCV 92.8 94.5 96.5 93.8 93.8  PLT 184 89* 75* 83* 89*    Basic Metabolic Panel: Recent Labs  Lab 10/11/20 0357 10/12/20 0249 10/13/20 0356 10/14/20 0238 10/15/20 0125  NA 133* 133* 135 138 139  K 3.6 3.4* 3.4* 3.4* 3.3*  CL 105 108 106 109 108  CO2 20* 22 21* 20* 20*  GLUCOSE 161* 117* 123* 119* 103*  BUN 28* 13 9 5* 6  CREATININE 0.94 0.73 0.78 0.71 0.72  CALCIUM 8.3* 8.0* 8.1* 8.3* 8.5*  MG  --  1.7  --   --  1.5*  PHOS  --  2.7  --   --   --     GFR: CrCl cannot be calculated (Unknown ideal weight.).  Liver Function Tests: Recent Labs  Lab 10/10/20 1226 10/15/20 0125  AST 34 21  ALT 20 15  ALKPHOS 86 89  BILITOT 3.4* 1.2  PROT 7.4 5.7*  ALBUMIN 3.9 2.7*    CBG: Recent Labs  Lab 10/14/20 1225 10/14/20 1720 10/14/20 2057 10/15/20 0827 10/15/20 1155  GLUCAP 161* 130* 149* 149* 208*     Recent Results (from the past 240 hour(s))  Culture, blood (routine x 2)     Status: Abnormal   Collection Time: 10/10/20  1:49 PM   Specimen: BLOOD  Result Value Ref Range Status   Specimen Description BLOOD SITE NOT SPECIFIED  Final   Special Requests   Final     BOTTLES DRAWN AEROBIC AND ANAEROBIC Blood Culture adequate volume   Culture  Setup Time   Final    GRAM POSITIVE COCCI IN CHAINS AEROBIC BOTTLE ONLY CRITICAL RESULT CALLED TO, READ BACK BY AND VERIFIED WITH: PHARMD G.BARR AT 1222 ON 10/11/2020 BY T.SAAD    Culture (A)  Final    STREPTOCOCCUS MITIS/ORALIS THE SIGNIFICANCE OF ISOLATING THIS ORGANISM FROM A SINGLE SET OF BLOOD CULTURES WHEN MULTIPLE SETS ARE DRAWN IS UNCERTAIN. PLEASE NOTIFY THE MICROBIOLOGY DEPARTMENT WITHIN ONE WEEK IF SPECIATION AND SENSITIVITIES ARE REQUIRED. Performed at [facility], [address], [place], [place] [address]    Report Status 10/14/2020 FINAL  Final  Culture, blood (routine x 2)     Status: None   Collection Time: 10/10/20  1:49 PM   Specimen: BLOOD  Result Value Ref Range Status   Specimen Description BLOOD SITE NOT SPECIFIED  Final   Special Requests   Final    BOTTLES DRAWN AEROBIC AND ANAEROBIC Blood Culture adequate volume   Culture   Final    NO GROWTH 5 DAYS Performed at [facility], [address] [place], [place] [address]    Report Status 10/15/2020 FINAL  Final  Blood Culture ID Panel (Reflexed)     Status: Abnormal   Collection Time: 10/10/20  1:49 PM  Result Value Ref Range Status   Enterococcus faecalis NOT DETECTED NOT DETECTED Final   Enterococcus Faecium NOT DETECTED NOT DETECTED Final   Listeria monocytogenes NOT DETECTED NOT DETECTED Final   Staphylococcus species NOT DETECTED NOT DETECTED Final   Staphylococcus aureus (BCID) NOT DETECTED NOT DETECTED Final   Staphylococcus epidermidis NOT DETECTED NOT DETECTED Final   Staphylococcus lugdunensis NOT  DETECTED NOT DETECTED Final   Streptococcus species DETECTED (A) NOT DETECTED Final    Comment: Not Enterococcus species, Streptococcus agalactiae, Streptococcus pyogenes, or Streptococcus pneumoniae. CRITICAL RESULT CALLED TO, READ BACK BY AND VERIFIED WITH: PHARMD G.BARR AT 1222 ON 10/11/2020 BY T.SAAD     Streptococcus agalactiae NOT DETECTED NOT DETECTED Final   Streptococcus pneumoniae NOT DETECTED NOT DETECTED Final   Streptococcus pyogenes NOT DETECTED NOT DETECTED Final   A.calcoaceticus-baumannii NOT DETECTED NOT DETECTED Final   Bacteroides fragilis NOT DETECTED NOT DETECTED Final   Enterobacterales NOT DETECTED NOT DETECTED Final   Enterobacter cloacae complex NOT DETECTED NOT DETECTED Final   Escherichia coli NOT DETECTED NOT DETECTED Final   Klebsiella aerogenes NOT DETECTED NOT DETECTED Final   Klebsiella oxytoca NOT DETECTED NOT DETECTED Final   Klebsiella pneumoniae NOT DETECTED NOT DETECTED Final   Proteus species NOT DETECTED NOT DETECTED Final   Salmonella species NOT DETECTED NOT DETECTED Final   Serratia marcescens NOT DETECTED NOT DETECTED Final   Haemophilus influenzae NOT DETECTED NOT DETECTED Final   Neisseria meningitidis NOT DETECTED NOT DETECTED Final   Pseudomonas aeruginosa NOT DETECTED NOT DETECTED Final   Stenotrophomonas maltophilia NOT DETECTED NOT DETECTED Final   Candida albicans NOT DETECTED NOT DETECTED Final   Candida auris NOT DETECTED NOT DETECTED Final   Candida glabrata NOT DETECTED NOT DETECTED Final   Candida krusei NOT DETECTED NOT DETECTED Final   Candida parapsilosis NOT DETECTED NOT DETECTED Final   Candida tropicalis NOT DETECTED NOT DETECTED Final   Cryptococcus neoformans/gattii NOT DETECTED NOT DETECTED Final    Comment: Performed at [facility], [address] [place], [place] [address]  Resp Panel by RT-PCR (Flu A&B, Covid) Nasopharyngeal Swab     Status: None   Collection Time: 10/10/20  2:58 PM   Specimen: Nasopharyngeal Swab; Nasopharyngeal(NP) swabs in vial transport medium  Result Value Ref Range Status   SARS Coronavirus 2 by RT PCR NEGATIVE NEGATIVE Final    Comment: (NOTE) SARS-CoV-2 target nucleic acids are NOT DETECTED.  The SARS-CoV-2 RNA is generally detectable in upper respiratory specimens during the acute  phase of infection. The lowest concentration of SARS-CoV-2 viral copies this assay can detect is 138 copies/mL. A negative result does not preclude SARS-Cov-2 infection and should not be used as the sole basis for treatment or other patient management decisions. A negative result may occur with  improper specimen collection/handling, submission of specimen other than nasopharyngeal swab, presence of viral mutation(s) within the areas targeted by this assay, and inadequate number of viral copies(<138 copies/mL). A negative result must be combined with clinical observations, patient history, and epidemiological information. The expected result is Negative.  Fact Sheet for Patients:  BloggerCourse.comhttps://www.fda.gov/media/152166/download  Fact Sheet for Healthcare Providers:  SeriousBroker.ithttps://www.fda.gov/media/152162/download  This test is no t yet approved or cleared by the [place] FDA and  has been authorized for detection and/or diagnosis of SARS-CoV-2 by FDA under an Emergency Use Authorization (EUA). This EUA will remain  in effect (meaning this test can be used) for the duration of the COVID-19 declaration under Section 564(b)(1) of the Act, 21 U.S.C.section 360bbb-3(b)(1), unless the authorization is terminated  or revoked sooner.       Influenza A by PCR NEGATIVE NEGATIVE Final   Influenza B by PCR NEGATIVE NEGATIVE Final    Comment: (NOTE) The Xpert Xpress SARS-CoV-2/FLU/RSV plus assay is intended as an aid in the diagnosis of influenza from Nasopharyngeal swab specimens and should not be  used as a sole basis for treatment. Nasal washings and aspirates are unacceptable for Xpert Xpress SARS-CoV-2/FLU/RSV testing.  Fact Sheet for Patients: BloggerCourse.com  Fact Sheet for Healthcare Providers: SeriousBroker.it  This test is not yet approved or cleared by the [place] FDA and has been authorized for detection and/or diagnosis of  SARS-CoV-2 by FDA under an Emergency Use Authorization (EUA). This EUA will remain in effect (meaning this test can be used) for the duration of the COVID-19 declaration under Section 564(b)(1) of the Act, 21 U.S.C. section 360bbb-3(b)(1), unless the authorization is terminated or revoked.  Performed at [facility], [address] [place], [place] [address]   Group A Strep by PCR     Status: None   Collection Time: 10/10/20  2:58 PM   Specimen: Nasopharyngeal Swab; Sterile Swab  Result Value Ref Range Status   Group A Strep by PCR NOT DETECTED NOT DETECTED Final    Comment: Performed at [facility], [address] [place], [place] [address]  Urine culture     Status: Abnormal   Collection Time: 10/11/20  4:54 AM   Specimen: Urine, Random  Result Value Ref Range Status   Specimen Description URINE, RANDOM  Final   Special Requests   Final    NONE Performed at [facility], [address], [place], [place] [address]    Culture MULTIPLE SPECIES PRESENT, SUGGEST RECOLLECTION (A)  Final   Report Status 10/12/2020 FINAL  Final         Radiology Studies: No results found.      Scheduled Meds: . dexamethasone (DECADRON) injection  10 mg Intravenous Q12H  . enoxaparin (LOVENOX) injection  40 mg Subcutaneous Q24H  . ferrous sulfate  325 mg Oral Q breakfast  . insulin aspart  0-9 Units Subcutaneous TID WC  . multivitamin with minerals  1 tablet Oral Daily  . pantoprazole (PROTONIX) IV  40 mg Intravenous Q12H  . QUEtiapine  300 mg Oral QHS  . senna-docusate  1 tablet Oral BID  . sertraline  50 mg Oral Daily  . sucralfate  1 g Oral TID WC & HS  . traZODone  150 mg Oral QHS   Continuous Infusions:    LOS: 4 days   Time spent: Greater than 50% of this time was spent in counseling, explanation of diagnosis, planning of further management, and coordination of care.   Voice Recognition Reubin Milan dictation system was used to create this note,  attempts have been made to correct errors. Please contact the author with questions and/or clarifications.   Albertine Grates, MD PhD FACP [facility]  Available via Epic secure chat 7am-7pm for nonurgent issues Please page for urgent issues To page the attending provider between 7A-7P or the covering provider during after hours 7P-7A, please log into the web site [email or web address] and access using universal [place] password for that web site. If you do not have the password, please call the hospital operator.    10/15/2020, 2:35 PM

## 2020-10-15 NOTE — Progress Notes (Signed)
PT Cancellation Note  Patient Details Name: Cory Palmer MRN: [id number] DOB: 08-03-1960   Cancelled Treatment:    Reason Eval/Treat Not Completed: Other (comment).  Pt reports he has been working on sliding for some time, and does not feel PT needs to help him to do this.  Has agreed to have PT check tomorrow to see if he still feels comfortable with moving, and if so will dc the PT order then.  Follow up as pt requires but likely does not need PT.   Ivar Drape 10/15/2020, 2:52 PM   Samul Dada, PT MS Acute Rehab Dept. Number: [facility] [id number] and [facility] [phone]

## 2020-10-15 NOTE — Plan of Care (Signed)
  Problem: Health Behavior/Discharge Planning: Goal: Ability to manage health-related needs will improve Outcome: Progressing   

## 2020-10-16 DIAGNOSIS — K209 Esophagitis, unspecified without bleeding: Secondary | ICD-10-CM | POA: Diagnosis not present

## 2020-10-16 DIAGNOSIS — S88111A Complete traumatic amputation at level between knee and ankle, right lower leg, initial encounter: Secondary | ICD-10-CM | POA: Diagnosis not present

## 2020-10-16 DIAGNOSIS — S88112A Complete traumatic amputation at level between knee and ankle, left lower leg, initial encounter: Secondary | ICD-10-CM | POA: Diagnosis not present

## 2020-10-16 DIAGNOSIS — D72829 Elevated white blood cell count, unspecified: Secondary | ICD-10-CM | POA: Diagnosis not present

## 2020-10-16 LAB — GLUCOSE, CAPILLARY
Glucose-Capillary: 226 mg/dL — ABNORMAL HIGH (ref 70–99)
Glucose-Capillary: 210 mg/dL — ABNORMAL HIGH (ref 70–99)
Glucose-Capillary: 247 mg/dL — ABNORMAL HIGH (ref 70–99)
Glucose-Capillary: 280 mg/dL — ABNORMAL HIGH (ref 70–99)

## 2020-10-16 LAB — BASIC METABOLIC PANEL
Anion gap: 8 (ref 5–15)
BUN: 9 mg/dL (ref 6–20)
CO2: 19 mmol/L — ABNORMAL LOW (ref 22–32)
Calcium: 8.7 mg/dL — ABNORMAL LOW (ref 8.9–10.3)
Chloride: 105 mmol/L (ref 98–111)
Creatinine, Ser: 0.7 mg/dL (ref 0.61–1.24)
GFR, Estimated: >60 mL/min (ref >60)
Glucose, Bld: 257 mg/dL — ABNORMAL HIGH (ref 70–99)
Potassium: 3.9 mmol/L (ref 3.5–5.1)
Sodium: 132 mmol/L — ABNORMAL LOW (ref 135–145)

## 2020-10-16 LAB — MAGNESIUM: Magnesium: 1.8 mg/dL (ref 1.7–2.4)

## 2020-10-16 LAB — C1 ESTERASE INHIBITOR: C1INH SerPl-mCnc: 29 mg/dL (ref 21–39)

## 2020-10-16 MED ORDER — BISACODYL 10 MG RE SUPP
10.0000 mg | Freq: Every day | RECTAL | Status: AC
  Administered 2020-10-16: 10 mg via RECTAL
  Filled 2020-10-16 (×2): qty 1

## 2020-10-16 MED ORDER — INSULIN GLARGINE 100 UNIT/ML ~~LOC~~ SOLN
8.0000 [IU] | Freq: Every day | SUBCUTANEOUS | Status: DC
  Administered 2020-10-16: 8 [IU] via SUBCUTANEOUS
  Filled 2020-10-16 (×2): qty 0.08

## 2020-10-16 MED ORDER — INSULIN ASPART 100 UNIT/ML ~~LOC~~ SOLN
3.0000 [IU] | Freq: Three times a day (TID) | SUBCUTANEOUS | Status: DC
  Administered 2020-10-16 – 2020-10-18 (×5): 3 [IU] via SUBCUTANEOUS

## 2020-10-16 NOTE — Progress Notes (Signed)
PROGRESS NOTE    Cory Palmer  HFW:[id number] DOB: August 25, 1960 DOA: 10/10/2020 PCP: Patient, [name] Per    Chief Complaint  Patient presents with  . Abdominal Pain  . Chest Pain  . Oral Swelling    Brief Narrative:  This 60 years old male with PMH significant for bilateral BKA, anxiety disorder, bipolar, type II DM, hypertension presents in the ED with significant lower lip edema and odynophagia(pain while swallowing).  Patient lives alone, is able to do his activities of daily living independently.  Patient reports having excruciating burning pain in his throat radiating towards the stomach after drinking orange juice 2 days ago since then he has been experiencing persistent moderate to severe burning pain that gets worse with swallowing.  Patient also reports he has not been able to take his home medication because of the pain.  EMS was called by his neighbor when he went to see him and found him with severe lip swelling.  Patient denies any fall or trauma to the lip or face.  Soft tissue CT neck showed no obstruction,  CT abdomen pelvis without any significant changes.  Patient is found to have severe leukocytosis which could be reactive.  GI consulted, scheduled to have EGD but it was canceled due to patient having significant lower lip swelling.    Subjective:  lower lip swelling appears slowly is improving, now able to open mouth better He did not tolerate diet advancement yesterday , however today state that he feeling better , want to eat more today    Assessment & Plan:   Principal Problem:   Leukocytosis Active Problems:   Diabetes mellitus type II, non insulin dependent (HCC)   Depressed bipolar disorder (HCC)   Hypertension   Below-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)   Cirrhosis of liver (HCC)   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HCC)   GERD (gastroesophageal reflux disease)  Odynophagia due to severe esophagitis -Patient reports severe burning  pain in the throat radiating towards the stomach after drinking orange juice 2 days ago and not able to take his medication due to pain while swallowing. -Continue antiacid therapy with sucralfate 4 times daily and twice daily IV pantoprazole 40 mg daily. -GI consulted, patient would benefit from EGD however not able to perform due to significant lower lip swelling I have discussed with GI Dr. Elnoria Howard who did not think DG esophagus will change management, cancel DG esophagus  Lower lip swelling -He denies trauma or fall -Denies allergy -reports lower teeth pulled  Three weeks ago without issues -He received vancx1, then ceftriaxone for 5 days - check C1 esterase inhibitor level -lower lips remains significant swelling, case discussed with ENT Dr Suszanne Conners who recommend clindamycin and decadron, Dr Suszanne Conners input appreciated  Leukocytosis/lactic acidosis /AKI/hyponatremia Resolved after hydration/abx  Hypokalemia/hypomagnesemia, improved, monitor  Thrombocytopenia Unclear etiology CT ab dose show "Grossly cirrhotic morphology of the liver with stigmata of portal hypertension including splenomegaly and gastroesophageal varices" No sign of bleeding B12 folate unremarkable Platelet nadir at 75, appear improving Monitor CBC  Noninsulin-dependent type 2 diabetes, controlled A1c 6.6 Home medication Metformin held Hyperglycemia due to steroid, start Lantus, add on meal coverage, continue SSI  Bilateral BKA Report lives by self and use wheelchair    Unresulted Labs (From admission, onward)          Start     Ordered   10/17/20 0500  Creatinine, serum  (enoxaparin (LOVENOX)    CrCl >/= 30 ml/min)  Weekly,   R     Comments: while on enoxaparin therapy    10/10/20 1816   10/17/20 0500  Magnesium  Tomorrow morning,   R        10/16/20 1656   10/17/20 0500  CBC  Tomorrow morning,   R        10/16/20 1656   10/16/20 0500  Basic metabolic panel  Daily,   R      10/15/20 1435             DVT prophylaxis: enoxaparin (LOVENOX) injection 40 mg Start: 10/10/20 1915   Code Status: Full Family Communication: Patient Disposition:   Status is: Inpatient  Dispo: The patient is from: Home              Anticipated d/c is to: To be determined              Anticipated d/c date is: To be determined                Consultants:   GI Dr. Elnoria HowardHung  ENT Dr Suszanne Connerseoh  Procedures:   None  Antimicrobials:   Anti-infectives (From admission, onward)   Start     Dose/Rate Route Frequency Ordered Stop   10/11/20 1400  cefTRIAXone (ROCEPHIN) 2 g in sodium chloride 0.9 % 100 mL IVPB        2 g 200 mL/hr over 30 Minutes Intravenous Every 24 hours 10/11/20 1233 10/15/20 1353   10/10/20 2055  vancomycin variable dose per unstable renal function (pharmacist dosing)  Status:  Discontinued         Does not apply See admin instructions 10/10/20 2055 10/11/20 1115   10/10/20 1515  vancomycin variable dose per unstable renal function (pharmacist dosing)  Status:  Discontinued         Does not apply See admin instructions 10/10/20 1515 10/10/20 1816   10/10/20 1445  ceFEPIme (MAXIPIME) 2 g in sodium chloride 0.9 % 100 mL IVPB        2 g 200 mL/hr over 30 Minutes Intravenous  Once 10/10/20 1435 10/10/20 1502   10/10/20 1445  vancomycin (VANCOREADY) IVPB 1750 mg/350 mL        1,750 mg 175 mL/hr over 120 Minutes Intravenous  Once 10/10/20 1444 10/10/20 1819          Objective: Vitals:   10/15/20 1758 10/15/20 2000 10/16/20 0414 10/16/20 1514  BP: 140/71 132/73 134/72 125/65  Pulse: 73 75 74 68  Resp: 16 17 17    Temp: 98.4 F (36.9 C) 98.2 F (36.8 C) 98.1 F (36.7 C) 97.8 F (36.6 C)  TempSrc:  Oral Oral   SpO2: 98% 99% 99% 98%    Intake/Output Summary (Last 24 hours) at 10/16/2020 1657 Last data filed at 10/16/2020 1400 Gross per 24 hour  Intake 480 ml  Output 1300 ml  Net -820 ml   There were no vitals filed for this visit.  Examination:  General exam: Lower lip  swelling appear improves some, calm, NAD Respiratory system: Clear to auscultation. Respiratory effort normal. Cardiovascular system: S1 & S2 heard, RRR. No JVD, no murmur, No pedal edema. Gastrointestinal system: Abdomen is nondistended, soft and nontender.  Normal bowel sounds heard. Central nervous system: Alert and oriented. No focal neurological deficits. Extremities: bilateral aka Skin: No rashes, lesions or ulcers Psychiatry: Judgement and insight appear normal. Mood & affect appropriate.         Data Reviewed: I have personally reviewed following labs  and imaging studies  CBC: Recent Labs  Lab 10/10/20 1335 10/11/20 0357 10/12/20 0249 10/14/20 0238 10/15/20 0125  WBC 32.4* 15.7* 7.4 7.9 6.6  NEUTROABS 25.3*  --   --   --  4.3  HGB 14.6 11.9* 10.3* 10.5* 10.7*  HCT 41.2 34.1* 30.1* 28.9* 30.4*  MCV 92.8 94.5 96.5 93.8 93.8  PLT 184 89* 75* 83* 89*    Basic Metabolic Panel: Recent Labs  Lab 10/12/20 0249 10/13/20 0356 10/14/20 0238 10/15/20 0125 10/16/20 0132  NA 133* 135 138 139 132*  K 3.4* 3.4* 3.4* 3.3* 3.9  CL 108 106 109 108 105  CO2 22 21* 20* 20* 19*  GLUCOSE 117* 123* 119* 103* 257*  BUN 13 9 5* 6 9  CREATININE 0.73 0.78 0.71 0.72 0.70  CALCIUM 8.0* 8.1* 8.3* 8.5* 8.7*  MG 1.7  --   --  1.5* 1.8  PHOS 2.7  --   --   --   --     GFR: CrCl cannot be calculated (Unknown ideal weight.).  Liver Function Tests: Recent Labs  Lab 10/10/20 1226 10/15/20 0125  AST 34 21  ALT 20 15  ALKPHOS 86 89  BILITOT 3.4* 1.2  PROT 7.4 5.7*  ALBUMIN 3.9 2.7*    CBG: Recent Labs  Lab 10/15/20 1155 10/15/20 1755 10/15/20 2002 10/16/20 0743 10/16/20 1219  GLUCAP 208* 156* 261* 226* 210*     Recent Results (from the past 240 hour(s))  Culture, blood (routine x 2)     Status: Abnormal   Collection Time: 10/10/20  1:49 PM   Specimen: BLOOD  Result Value Ref Range Status   Specimen Description BLOOD SITE NOT SPECIFIED  Final   Special Requests    Final    BOTTLES DRAWN AEROBIC AND ANAEROBIC Blood Culture adequate volume   Culture  Setup Time   Final    GRAM POSITIVE COCCI IN CHAINS AEROBIC BOTTLE ONLY CRITICAL RESULT CALLED TO, READ BACK BY AND VERIFIED WITH: PHARMD G.BARR AT 1222 ON 10/11/2020 BY T.SAAD    Culture (A)  Final    STREPTOCOCCUS MITIS/ORALIS THE SIGNIFICANCE OF ISOLATING THIS ORGANISM FROM A SINGLE SET OF BLOOD CULTURES WHEN MULTIPLE SETS ARE DRAWN IS UNCERTAIN. PLEASE NOTIFY THE MICROBIOLOGY DEPARTMENT WITHIN ONE WEEK IF SPECIATION AND SENSITIVITIES ARE REQUIRED. Performed at [facility], [address], [place], [place] [address]    Report Status 10/14/2020 FINAL  Final  Culture, blood (routine x 2)     Status: None   Collection Time: 10/10/20  1:49 PM   Specimen: BLOOD  Result Value Ref Range Status   Specimen Description BLOOD SITE NOT SPECIFIED  Final   Special Requests   Final    BOTTLES DRAWN AEROBIC AND ANAEROBIC Blood Culture adequate volume   Culture   Final    NO GROWTH 5 DAYS Performed at [facility], [address] [place], [place] [address]    Report Status 10/15/2020 FINAL  Final  Blood Culture ID Panel (Reflexed)     Status: Abnormal   Collection Time: 10/10/20  1:49 PM  Result Value Ref Range Status   Enterococcus faecalis NOT DETECTED NOT DETECTED Final   Enterococcus Faecium NOT DETECTED NOT DETECTED Final   Listeria monocytogenes NOT DETECTED NOT DETECTED Final   Staphylococcus species NOT DETECTED NOT DETECTED Final   Staphylococcus aureus (BCID) NOT DETECTED NOT DETECTED Final   Staphylococcus epidermidis NOT DETECTED NOT DETECTED Final   Staphylococcus lugdunensis NOT DETECTED NOT  DETECTED Final   Streptococcus species DETECTED (A) NOT DETECTED Final    Comment: Not Enterococcus species, Streptococcus agalactiae, Streptococcus pyogenes, or Streptococcus pneumoniae. CRITICAL RESULT CALLED TO, READ BACK BY AND VERIFIED WITH: PHARMD G.BARR AT 1222 ON 10/11/2020 BY T.SAAD     Streptococcus agalactiae NOT DETECTED NOT DETECTED Final   Streptococcus pneumoniae NOT DETECTED NOT DETECTED Final   Streptococcus pyogenes NOT DETECTED NOT DETECTED Final   A.calcoaceticus-baumannii NOT DETECTED NOT DETECTED Final   Bacteroides fragilis NOT DETECTED NOT DETECTED Final   Enterobacterales NOT DETECTED NOT DETECTED Final   Enterobacter cloacae complex NOT DETECTED NOT DETECTED Final   Escherichia coli NOT DETECTED NOT DETECTED Final   Klebsiella aerogenes NOT DETECTED NOT DETECTED Final   Klebsiella oxytoca NOT DETECTED NOT DETECTED Final   Klebsiella pneumoniae NOT DETECTED NOT DETECTED Final   Proteus species NOT DETECTED NOT DETECTED Final   Salmonella species NOT DETECTED NOT DETECTED Final   Serratia marcescens NOT DETECTED NOT DETECTED Final   Haemophilus influenzae NOT DETECTED NOT DETECTED Final   Neisseria meningitidis NOT DETECTED NOT DETECTED Final   Pseudomonas aeruginosa NOT DETECTED NOT DETECTED Final   Stenotrophomonas maltophilia NOT DETECTED NOT DETECTED Final   Candida albicans NOT DETECTED NOT DETECTED Final   Candida auris NOT DETECTED NOT DETECTED Final   Candida glabrata NOT DETECTED NOT DETECTED Final   Candida krusei NOT DETECTED NOT DETECTED Final   Candida parapsilosis NOT DETECTED NOT DETECTED Final   Candida tropicalis NOT DETECTED NOT DETECTED Final   Cryptococcus neoformans/gattii NOT DETECTED NOT DETECTED Final    Comment: Performed at [facility], [address] [place], [place] [address]  Resp Panel by RT-PCR (Flu A&B, Covid) Nasopharyngeal Swab     Status: None   Collection Time: 10/10/20  2:58 PM   Specimen: Nasopharyngeal Swab; Nasopharyngeal(NP) swabs in vial transport medium  Result Value Ref Range Status   SARS Coronavirus 2 by RT PCR NEGATIVE NEGATIVE Final    Comment: (NOTE) SARS-CoV-2 target nucleic acids are NOT DETECTED.  The SARS-CoV-2 RNA is generally detectable in upper respiratory specimens during the acute  phase of infection. The lowest concentration of SARS-CoV-2 viral copies this assay can detect is 138 copies/mL. A negative result does not preclude SARS-Cov-2 infection and should not be used as the sole basis for treatment or other patient management decisions. A negative result may occur with  improper specimen collection/handling, submission of specimen other than nasopharyngeal swab, presence of viral mutation(s) within the areas targeted by this assay, and inadequate number of viral copies(<138 copies/mL). A negative result must be combined with clinical observations, patient history, and epidemiological information. The expected result is Negative.  Fact Sheet for Patients:  BloggerCourse.com  Fact Sheet for Healthcare Providers:  SeriousBroker.it  This test is no t yet approved or cleared by the [place] FDA and  has been authorized for detection and/or diagnosis of SARS-CoV-2 by FDA under an Emergency Use Authorization (EUA). This EUA will remain  in effect (meaning this test can be used) for the duration of the COVID-19 declaration under Section 564(b)(1) of the Act, 21 U.S.C.section 360bbb-3(b)(1), unless the authorization is terminated  or revoked sooner.       Influenza A by PCR NEGATIVE NEGATIVE Final   Influenza B by PCR NEGATIVE NEGATIVE Final    Comment: (NOTE) The Xpert Xpress SARS-CoV-2/FLU/RSV plus assay is intended as an aid in the diagnosis of influenza from Nasopharyngeal swab specimens and should not be used as  a sole basis for treatment. Nasal washings and aspirates are unacceptable for Xpert Xpress SARS-CoV-2/FLU/RSV testing.  Fact Sheet for Patients: BloggerCourse.com  Fact Sheet for Healthcare Providers: SeriousBroker.it  This test is not yet approved or cleared by the [place] FDA and has been authorized for detection and/or diagnosis of  SARS-CoV-2 by FDA under an Emergency Use Authorization (EUA). This EUA will remain in effect (meaning this test can be used) for the duration of the COVID-19 declaration under Section 564(b)(1) of the Act, 21 U.S.C. section 360bbb-3(b)(1), unless the authorization is terminated or revoked.  Performed at [facility], [address], [place], [place] [address]   Group A Strep by PCR     Status: None   Collection Time: 10/10/20  2:58 PM   Specimen: Nasopharyngeal Swab; Sterile Swab  Result Value Ref Range Status   Group A Strep by PCR NOT DETECTED NOT DETECTED Final    Comment: Performed at [facility], [address] [place], [place] [address]  Urine culture     Status: Abnormal   Collection Time: 10/11/20  4:54 AM   Specimen: Urine, Random  Result Value Ref Range Status   Specimen Description URINE, RANDOM  Final   Special Requests   Final    NONE Performed at [facility], [address], [place], [place] [address]    Culture MULTIPLE SPECIES PRESENT, SUGGEST RECOLLECTION (A)  Final   Report Status 10/12/2020 FINAL  Final         Radiology Studies: No results found.      Scheduled Meds: . bisacodyl  10 mg Rectal Daily  . dexamethasone (DECADRON) injection  10 mg Intravenous Q12H  . enoxaparin (LOVENOX) injection  40 mg Subcutaneous Q24H  . ferrous sulfate  325 mg Oral Q breakfast  . insulin aspart  0-9 Units Subcutaneous TID WC  . insulin aspart  3 Units Subcutaneous TID WC  . insulin glargine  8 Units Subcutaneous QHS  . multivitamin with minerals  1 tablet Oral Daily  . pantoprazole (PROTONIX) IV  40 mg Intravenous Q12H  . QUEtiapine  300 mg Oral QHS  . senna-docusate  1 tablet Oral BID  . sertraline  50 mg Oral Daily  . sucralfate  1 g Oral TID WC & HS  . traZODone  150 mg Oral QHS   Continuous Infusions:    LOS: 5 days   Time spent: Greater than 50% of this time was spent in counseling, explanation of diagnosis, planning of  further management, and coordination of care.   Voice Recognition [employer]ubin [employer] dictation system was used to create this note, attempts have been made to correct errors. Please contact the author with questions and/or clarifications.   Albertine Grates, MD PhD FACP [facility]  Available via Epic secure chat 7am-7pm for nonurgent issues Please page for urgent issues To page the attending provider between 7A-7P or the covering provider during after hours 7P-7A, please log into the web site [email or web address] and access using universal [place] password for that web site. If you do not have the password, please call the hospital operator.    10/16/2020, 4:57 PM

## 2020-10-16 NOTE — Consult Note (Signed)
Reason for Consult: Lower lip swelling Referring Physician: Florencia Reasons, MD  HPI:  Cory Palmer is an 60 y.o. male who was admitted 6 days ago for treatment of his severe esophagitis and lower lip swelling. He has a history of multiple medical problems, including longstanding Type II diabetes complicated by bilateral BKA's, anxiety disorder., bipolar disorder, cirrhosis of the liver and hypertension. His neck CT scan showed no pharyngeal or laryngeal abnormality. He did have thickening of the distal esophagus, and isolated lower lip edema without focal abscess. He was treated with IV protonix and ceftriaxone. He was started on decadron and clindamycin yesterday.  Past Medical History:  Diagnosis Date  . Anxiety   . Depressed bipolar disorder ([place])   . Diabetes mellitus without complication ([place])   . Hypertension   . Osteomyelitis of left foot ([place]) 10/19/2019  . Osteomyelitis of toe of left foot ([place])   . S/P transmetatarsal amputation of foot, left ([place]) 09/28/2018    Past Surgical History:  Procedure Laterality Date  . AMPUTATION Left 09/28/2018   Procedure: LEFT TRANSMETATARSAL AMPUTATION;  Surgeon: Newt Minion, MD;  Location: [facility];  Service: Orthopedics;  Laterality: Left;  . AMPUTATION Left 10/23/2019   Procedure: AMPUTATION BELOW KNEE;  Surgeon: Newt Minion, MD;  Location: [facility];  Service: Orthopedics;  Laterality: Left;  . BELOW KNEE LEG AMPUTATION Right   . TEE WITHOUT CARDIOVERSION N/A 10/25/2019   Procedure: TRANSESOPHAGEAL ECHOCARDIOGRAM (TEE);  Surgeon: Lelon Perla, MD;  Location: [facility] ENDOSCOPY;  Service: Cardiovascular;  Laterality: N/A;    Family History  Problem Relation Age of Onset  . Hypertension Other   . Diabetes Mellitus II Other     Social History:  reports that he has been smoking cigarettes. He has never used smokeless tobacco. He reports previous alcohol use. He reports previous drug use. Drug: Cocaine.  Allergies: No Known Allergies  Prior to  Admission medications   Medication Sig Start Date End Date Taking? Authorizing Provider  QUEtiapine (SEROQUEL) 300 MG tablet Take 300 mg by mouth at bedtime. 08/01/19  Yes [provider]  sertraline (ZOLOFT) 50 MG tablet Take 50 mg by mouth daily. 08/01/19  Yes [provider]  traZODone (DESYREL) 150 MG tablet Take 150 mg by mouth at bedtime.   Yes [provider]  Blood Glucose Monitoring Suppl (ACCU-CHEK AVIVA PLUS) w/Device KIT  05/23/19   [provider]  ferrous sulfate 325 (65 FE) MG tablet Take 1 tablet (325 mg total) by mouth daily with breakfast. Patient not taking: Reported on 10/10/2020 11/06/19   Dessa Phi, DO  metFORMIN (GLUCOPHAGE) 1000 MG tablet Take 1 tablet (1,000 mg total) by mouth daily. Patient not taking: Reported on 10/10/2020 10/21/19   Murlean Iba, MD  Multiple Vitamin (MULTIVITAMIN WITH MINERALS) TABS tablet Take 1 tablet by mouth daily. Patient not taking: Reported on 10/10/2020 05/11/18   Edwin Dada, MD    Medications:  I have reviewed the patient's current medications. Scheduled: . dexamethasone (DECADRON) injection  10 mg Intravenous Q12H  . enoxaparin (LOVENOX) injection  40 mg Subcutaneous Q24H  . ferrous sulfate  325 mg Oral Q breakfast  . insulin aspart  0-9 Units Subcutaneous TID WC  . multivitamin with minerals  1 tablet Oral Daily  . pantoprazole (PROTONIX) IV  40 mg Intravenous Q12H  . QUEtiapine  300 mg Oral QHS  . senna-docusate  1 tablet Oral BID  . sertraline  50 mg Oral Daily  . sucralfate  1 g Oral TID WC & HS  . traZODone  150 mg Oral QHS   Continuous:   Results for orders placed or performed during the hospital encounter of 10/10/20 (from the past 48 hour(s))  Glucose, capillary     Status: Abnormal   Collection Time: 10/14/20  7:29 AM  Result Value Ref Range   Glucose-Capillary 135 (H) 70 - 99 mg/dL    Comment: Glucose reference range applies only to samples taken after fasting  for at least 8 hours.  Glucose, capillary     Status: Abnormal   Collection Time: 10/14/20 12:25 PM  Result Value Ref Range   Glucose-Capillary 161 (H) 70 - 99 mg/dL    Comment: Glucose reference range applies only to samples taken after fasting for at least 8 hours.  Glucose, capillary     Status: Abnormal   Collection Time: 10/14/20  5:20 PM  Result Value Ref Range   Glucose-Capillary 130 (H) 70 - 99 mg/dL    Comment: Glucose reference range applies only to samples taken after fasting for at least 8 hours.  Glucose, capillary     Status: Abnormal   Collection Time: 10/14/20  8:57 PM  Result Value Ref Range   Glucose-Capillary 149 (H) 70 - 99 mg/dL    Comment: Glucose reference range applies only to samples taken after fasting for at least 8 hours.  CBC with Differential/Platelet     Status: Abnormal   Collection Time: 10/15/20  1:25 AM  Result Value Ref Range   WBC 6.6 4.0 - 10.5 K/uL   RBC 3.24 (L) 4.22 - 5.81 MIL/uL   Hemoglobin 10.7 (L) 13.0 - 17.0 g/dL   HCT 30.4 (L) 39.0 - 52.0 %   MCV 93.8 80.0 - 100.0 fL   MCH 33.0 26.0 - 34.0 pg   MCHC 35.2 30.0 - 36.0 g/dL   RDW 13.4 11.5 - 15.5 %   Platelets 89 (L) 150 - 400 K/uL    Comment: Immature Platelet Fraction may be clinically indicated, consider ordering this additional test [id number] CONSISTENT WITH PREVIOUS RESULT REPEATED TO VERIFY    nRBC 0.0 0.0 - 0.2 %   Neutrophils Relative % 65 %   Neutro Abs 4.3 1.7 - 7.7 K/uL   Lymphocytes Relative 20 %   Lymphs Abs 1.3 0.7 - 4.0 K/uL   Monocytes Relative 9 %   Monocytes Absolute 0.6 0.1 - 1.0 K/uL   Eosinophils Relative 4 %   Eosinophils Absolute 0.2 0.0 - 0.5 K/uL   Basophils Relative 1 %   Basophils Absolute 0.0 0.0 - 0.1 K/uL   Immature Granulocytes 1 %   Abs Immature Granulocytes 0.09 (H) 0.00 - 0.07 K/uL    Comment: Performed at [facility], [address] [place], [place] [address]  Vitamin B12     Status: None   Collection Time: 10/15/20  1:25 AM   Result Value Ref Range   Vitamin B-12 772 180 - 914 pg/mL    Comment: (NOTE) This assay is not validated for testing neonatal or myeloproliferative syndrome specimens for Vitamin B12 levels. Performed at [facility], [place] [address] [place], [place] [address]   Comprehensive metabolic panel     Status: Abnormal   Collection Time: 10/15/20  1:25 AM  Result Value Ref Range   Sodium 139 135 - 145 mmol/L   Potassium 3.3 (L) 3.5 - 5.1 mmol/L   Chloride 108 98 - 111 mmol/L   CO2 20 (L) 22 -  32 mmol/L   Glucose, Bld 103 (H) 70 - 99 mg/dL    Comment: Glucose reference range applies only to samples taken after fasting for at least 8 hours.   BUN 6 6 - 20 mg/dL   Creatinine, Ser 0.72 0.61 - 1.24 mg/dL   Calcium 8.5 (L) 8.9 - 10.3 mg/dL   Total Protein 5.7 (L) 6.5 - 8.1 g/dL   Albumin 2.7 (L) 3.5 - 5.0 g/dL   AST 21 15 - 41 U/L   ALT 15 0 - 44 U/L   Alkaline Phosphatase 89 38 - 126 U/L   Total Bilirubin 1.2 0.3 - 1.2 mg/dL   GFR, Estimated >60 >60 mL/min    Comment: (NOTE) Calculated using the CKD-EPI Creatinine Equation (2021)    Anion gap 11 5 - 15    Comment: Performed at [facility], [place] [address] [place], [place] [address]  Magnesium     Status: Abnormal   Collection Time: 10/15/20  1:25 AM  Result Value Ref Range   Magnesium 1.5 (L) 1.7 - 2.4 mg/dL    Comment: Performed at [place] [address], [place], [place] [address]  Folate, serum, performed at [facility] lab     Status: None   Collection Time: 10/15/20  1:29 AM  Result Value Ref Range   Folate 29.9 >5.9 ng/mL    Comment: Performed at [facility], [place] [address] [place], [place] [address]  Glucose, capillary     Status: Abnormal   Collection Time: 10/15/20  8:27 AM  Result Value Ref Range   Glucose-Capillary 149 (H) 70 - 99 mg/dL    Comment: Glucose reference range applies only to samples taken after fasting for at least 8 hours.  Glucose, capillary     Status: Abnormal    Collection Time: 10/15/20 11:55 AM  Result Value Ref Range   Glucose-Capillary 208 (H) 70 - 99 mg/dL    Comment: Glucose reference range applies only to samples taken after fasting for at least 8 hours.  Glucose, capillary     Status: Abnormal   Collection Time: 10/15/20  5:55 PM  Result Value Ref Range   Glucose-Capillary 156 (H) 70 - 99 mg/dL    Comment: Glucose reference range applies only to samples taken after fasting for at least 8 hours.  Glucose, capillary     Status: Abnormal   Collection Time: 10/15/20  8:02 PM  Result Value Ref Range   Glucose-Capillary 261 (H) 70 - 99 mg/dL    Comment: Glucose reference range applies only to samples taken after fasting for at least 8 hours.  Basic metabolic panel     Status: Abnormal   Collection Time: 10/16/20  1:32 AM  Result Value Ref Range   Sodium 132 (L) 135 - 145 mmol/L   Potassium 3.9 3.5 - 5.1 mmol/L   Chloride 105 98 - 111 mmol/L   CO2 19 (L) 22 - 32 mmol/L   Glucose, Bld 257 (H) 70 - 99 mg/dL    Comment: Glucose reference range applies only to samples taken after fasting for at least 8 hours.   BUN 9 6 - 20 mg/dL   Creatinine, Ser 0.70 0.61 - 1.24 mg/dL   Calcium 8.7 (L) 8.9 - 10.3 mg/dL   GFR, Estimated >60 >60 mL/min    Comment: (NOTE) Calculated using the CKD-EPI Creatinine Equation (2021)    Anion gap 8 5 - 15    Comment: Performed at [facility], [place] [address], [place], [place] [address]  Magnesium     Status: None   Collection Time: 10/16/20  1:32 AM  Result Value Ref Range   Magnesium 1.8 1.7 - 2.4 mg/dL    Comment: Performed at [place] [address], [place], [place] [address]    No results found.   Review of Systems: All systems reviewed and negative except where noted above  Blood pressure 134/72, pulse 74, temperature 98.1 F (36.7 C), temperature source Oral, resp. rate 17, SpO2 99 %. General appearance: alert and cooperative Head: Normocephalic, without obvious abnormality,  atraumatic Eyes: Pupils are equal, round, reactive to light. Extraocular motion is intact.  Ears: Examination of the ears shows normal auricles and external auditory canals bilaterally.  Nose: Nasal examination shows normal mucosa, septum, turbinates.  Face: Facial examination shows no asymmetry. Palpation of the face elicit no significant tenderness.  Mouth: Severe lower lip edema. No purulent drainage. Poor dentition. The rest of oral cavity and pharynx are clear. Neck: Palpation of the neck reveals no mass. The trachea is midline.  Neuro: Cranial nerves 2-12 are grossly intact.  Assessment/Plan: Lower lip edema of unknown etiology - likely autoimmune versus infectious. - IV clindamycin and decadron as scheduled. - No role for surgical intervention as this time.  Cory Palmer W Michille Mcelrath 10/16/2020, 7:11 AM

## 2020-10-16 NOTE — Plan of Care (Signed)

## 2020-10-16 NOTE — Progress Notes (Signed)
Inpatient Diabetes Program Recommendations  AACE/ADA: New Consensus Statement on Inpatient Glycemic Control (2015)  Target Ranges:  Prepandial:   less than 140 mg/dL      Peak postprandial:   less than 180 mg/dL (1-2 hours)      Critically ill patients:  140 - 180 mg/dL   Lab Results  Component Value Date   GLUCAP 226 (H) 10/16/2020   HGBA1C 6.6 (H) 10/10/2020    Review of Glycemic Control Results for MOE, GRACA (MRN [id number]) as of 10/16/2020 11:10  Ref. Range 10/15/2020 08:27 10/15/2020 11:55 10/15/2020 17:55 10/15/2020 20:02 10/16/2020 07:43  Glucose-Capillary Latest Ref Range: 70 - 99 mg/dL 086 (H) 761 (H) 950 (H) 261 (H) 226 (H)   severe esophagitis and lower lip swelling Diabetes history: DM 2 Outpatient Diabetes medications: None Current orders for Inpatient glycemic control:  Novolog 0-9 units tid Decadron 10 mg Q12 hours A1c 6.6% on 3/12  Inpatient Diabetes Program Recommendations:    -  Increase Novolog to "moderate" 0-15 units tid -  Add Novolog hs scale  Thanks,  Christena Deem RN, MSN, BC-ADM Inpatient Diabetes Coordinator Team Pager [phone] (8a-5p)

## 2020-10-16 NOTE — Progress Notes (Signed)
PT Cancellation Note  Patient Details Name: Cory Palmer MRN: [id number] DOB: 01/13/1961   Cancelled Treatment:    Reason Eval/Treat Not Completed: Patient declined, no reason specified.   Pt refused, stating he did not see the point.  Therapist educated on the risk of being in the bed too long.  I did say that I could bring a WC and he seemed to like the idea of a WC instead of getting up to the recliner chair.  He agreed for PT to come back tomorrow if I bring a WC.    Thanks,  Corinna Capra, PT, DPT  Acute Rehabilitation [phone] pager #[phone] office       Lurena Joiner B Permelia Bamba 10/16/2020, 3:50 PM

## 2020-10-16 NOTE — Social Work (Signed)
Pt requested to speak to Education officer, museum. CSW met with pt at bedside. Pt stated that he lost his money order at the beginning of the month and his landlord told him that she will start the eviction process on the 16th. CSW encouraged pt to call landlord and discuss the situation. CSW also gave pt list of resources that he can call to see if he can get assistance.   Emeterio Reeve, Latanya Presser, [place] Social Worker [phone]

## 2020-10-17 DIAGNOSIS — D72829 Elevated white blood cell count, unspecified: Secondary | ICD-10-CM | POA: Diagnosis not present

## 2020-10-17 DIAGNOSIS — S88111A Complete traumatic amputation at level between knee and ankle, right lower leg, initial encounter: Secondary | ICD-10-CM | POA: Diagnosis not present

## 2020-10-17 DIAGNOSIS — K209 Esophagitis, unspecified without bleeding: Secondary | ICD-10-CM | POA: Diagnosis not present

## 2020-10-17 DIAGNOSIS — S88112A Complete traumatic amputation at level between knee and ankle, left lower leg, initial encounter: Secondary | ICD-10-CM | POA: Diagnosis not present

## 2020-10-17 LAB — CBC
HCT: 27.8 % — ABNORMAL LOW (ref 39.0–52.0)
Hemoglobin: 10 g/dL — ABNORMAL LOW (ref 13.0–17.0)
MCH: 33.7 pg (ref 26.0–34.0)
MCHC: 36 g/dL (ref 30.0–36.0)
MCV: 93.6 fL (ref 80.0–100.0)
Platelets: 90 10*3/uL — ABNORMAL LOW (ref 150–400)
RBC: 2.97 MIL/uL — ABNORMAL LOW (ref 4.22–5.81)
RDW: 13.8 % (ref 11.5–15.5)
WBC: 9.5 10*3/uL (ref 4.0–10.5)
nRBC: 0 % (ref 0.0–0.2)

## 2020-10-17 LAB — BASIC METABOLIC PANEL
Anion gap: 8 (ref 5–15)
BUN: 18 mg/dL (ref 6–20)
CO2: 19 mmol/L — ABNORMAL LOW (ref 22–32)
Calcium: 8.7 mg/dL — ABNORMAL LOW (ref 8.9–10.3)
Chloride: 106 mmol/L (ref 98–111)
Creatinine, Ser: 0.85 mg/dL (ref 0.61–1.24)
GFR, Estimated: >60 mL/min (ref >60)
Glucose, Bld: 329 mg/dL — ABNORMAL HIGH (ref 70–99)
Potassium: 4 mmol/L (ref 3.5–5.1)
Sodium: 133 mmol/L — ABNORMAL LOW (ref 135–145)

## 2020-10-17 LAB — GLUCOSE, CAPILLARY
Glucose-Capillary: 250 mg/dL — ABNORMAL HIGH (ref 70–99)
Glucose-Capillary: 299 mg/dL — ABNORMAL HIGH (ref 70–99)
Glucose-Capillary: 345 mg/dL — ABNORMAL HIGH (ref 70–99)
Glucose-Capillary: 344 mg/dL — ABNORMAL HIGH (ref 70–99)

## 2020-10-17 LAB — MAGNESIUM: Magnesium: 1.8 mg/dL (ref 1.7–2.4)

## 2020-10-17 MED ORDER — POLYETHYLENE GLYCOL 3350 17 G PO PACK
17.0000 g | PACK | Freq: Two times a day (BID) | ORAL | Status: DC
  Administered 2020-10-17 – 2020-10-20 (×7): 17 g via ORAL
  Filled 2020-10-17 (×7): qty 1

## 2020-10-17 MED ORDER — INSULIN GLARGINE 100 UNIT/ML ~~LOC~~ SOLN
8.0000 [IU] | Freq: Two times a day (BID) | SUBCUTANEOUS | Status: DC
  Administered 2020-10-17 (×2): 8 [IU] via SUBCUTANEOUS
  Filled 2020-10-17 (×4): qty 0.08

## 2020-10-17 NOTE — Plan of Care (Signed)

## 2020-10-17 NOTE — Progress Notes (Signed)
Subjective: No new complaint. His lower lip swelling has improved.  Objective: Vital signs in last 24 hours: Temp:  [97.8 F (36.6 C)-98.3 F (36.8 C)] 98.3 F (36.8 C) (03/19 0524) Pulse Rate:  [62-68] 62 (03/19 0524) Resp:  [17-18] 17 (03/19 0524) BP: (123-125)/(60-76) 124/76 (03/19 0524) SpO2:  [98 %] 98 % (03/19 0524)  Physical Exam: General appearance: alert and cooperative Head: Normocephalic, without obvious abnormality, atraumatic Eyes: Pupils are equal, round, reactive to light. Extraocular motion is intact.  Ears: Examination of the ears shows normal auricles and external auditory canals bilaterally.  Nose: Nasal examination shows normal mucosa, septum, turbinates.  Face: Facial examination shows no asymmetry. Palpation of the face elicit no significant tenderness.  Mouth: Moderate lower lip edema. No purulent drainage. Poor dentition. The rest of oral cavity and pharynx are clear. Neck: Palpation of the neck reveals no mass. The trachea is midline.    Recent Labs    10/15/20 0125 10/17/20 0026  WBC 6.6 9.5  HGB 10.7* 10.0*  HCT 30.4* 27.8*  PLT 89* 90*   Recent Labs    10/16/20 0132 10/17/20 0026  NA 132* 133*  K 3.9 4.0  CL 105 106  CO2 19* 19*  GLUCOSE 257* 329*  BUN 9 18  CREATININE 0.70 0.85  CALCIUM 8.7* 8.7*    Medications:  I have reviewed the patient's current medications. Scheduled: . bisacodyl  10 mg Rectal Daily  . dexamethasone (DECADRON) injection  10 mg Intravenous Q12H  . enoxaparin (LOVENOX) injection  40 mg Subcutaneous Q24H  . ferrous sulfate  325 mg Oral Q breakfast  . insulin aspart  0-9 Units Subcutaneous TID WC  . insulin aspart  3 Units Subcutaneous TID WC  . insulin glargine  8 Units Subcutaneous BID  . multivitamin with minerals  1 tablet Oral Daily  . pantoprazole (PROTONIX) IV  40 mg Intravenous Q12H  . QUEtiapine  300 mg Oral QHS  . senna-docusate  1 tablet Oral BID  . sertraline  50 mg Oral Daily  . sucralfate  1 g  Oral TID WC & HS  . traZODone  150 mg Oral QHS   Continuous:   Assessment/Plan: Lower lip edema has improved - autoimmune versus infectious etiology. - Continue clindamycin and decadron - If continues to improve, consider changing to oral clindamycin (e.g. 300mg  QID for 7 days). - Pt may follow up with me as an outpatient.     LOS: 6 days   Eugine Bubb W Lonie Newsham 10/17/2020, 11:02 AM

## 2020-10-17 NOTE — Evaluation (Signed)
Physical Therapy Evaluation/Discharge Patient Details Name: Cory Palmer MRN: [id number] DOB: 1961/02/24 Today's Date: 10/17/2020   History of Present Illness  60 y.o. male admitted on 10/10/20 forlower lip edema and odynophagia die to severe esophagitis, leukocytosis, lactic acidosis, AKI and hyponatremia resolved with hydration/antibiotics, thrombocytopenia.  Pt with significant PMH of bil BKA, DM2, HTN, biolar d/o.  Clinical Impression  Pt was able to demonstrate proficiency at a WC level both transfers, propulsion (went all the way around the unit) and seated bathing at sink in the bathroom.  He is at his baseline level of function and has no further acute PT needs.  He will likely need some OP PT after he gets his new bil prosthesis, but that may be a few months from now.  PT to sign off.   Follow Up Recommendations No PT follow up    Equipment Recommendations  None recommended by PT (he could use a new manual WC, but if he gets one from [place] he may be blocked from getting an electric scooter that he has ordered.)    Recommendations for Other Services       Precautions / Restrictions Precautions Precautions: Fall      Mobility  Bed Mobility Overal bed mobility: Independent                  Transfers Overall transfer level: Modified independent               General transfer comment: AP transfer from bed to [facility] and back to bed.  Supervision for safety, but no physical assist needed.  Ambulation/Gait                Administrator mobility: Yes Wheelchair propulsion: Both upper extremities Wheelchair parts: Independent Distance: 500 Wheelchair Assistance Details (indicate cue type and reason): Pt able to manage WC without assist, cues to lock breaks for safety at sink in bathroom.  Able to wheel the whole unit with supervision.  Modified Rankin (Stroke Patients Only)       Balance Overall  balance assessment: Needs assistance Sitting-balance support: Feet supported;No upper extremity supported Sitting balance-Leahy Scale: Good                                       Pertinent Vitals/Pain Pain Assessment: No/denies pain    Home Living Family/patient expects to be discharged to:: Private residence Living Arrangements: Alone   Type of Home: Apartment Home Access: Level entry     Home Layout: One level Home Equipment: Wheelchair - manual;Hand held shower head;Grab bars - tub/shower      Prior Function Level of Independence: Independent with assistive device(s)         Comments: functions from a WC level, applied to get a scooter, also working on getting new bil lower leg prosthesis.     Hand Dominance   Dominant Hand: Right    Extremity/Trunk Assessment   Upper Extremity Assessment Upper Extremity Assessment: Overall WFL for tasks assessed    Lower Extremity Assessment Lower Extremity Assessment: Overall WFL for tasks assessed    Cervical / Trunk Assessment Cervical / Trunk Assessment: Normal  Communication   Communication: No difficulties  Cognition Arousal/Alertness: Awake/alert Behavior During Therapy: WFL for tasks assessed/performed Overall Cognitive Status: Within Functional Limits for tasks assessed  General Comments General comments (skin integrity, edema, etc.): Pt left in bathroom to do a sponge bath and shaving at the sink.  He was able to do so from a WC level without difficulty.  Changed his pants in the bed.    Exercises     Assessment/Plan    PT Assessment Patent does not need any further PT services  PT Problem List Decreased mobility       PT Treatment Interventions DME instruction;Functional mobility training;Therapeutic activities;Balance training;Wheelchair mobility training    PT Goals (Current goals can be found in the Care Plan section)  Acute  Rehab PT Goals Patient Stated Goal:  (to go home and start to get the things he needs to get back up on his feet.) PT Goal Formulation: All assessment and education complete, DC therapy    Frequency     Barriers to discharge        Co-evaluation               AM-PAC PT "6 Clicks" Mobility  Outcome Measure Help needed turning from your back to your side while in a flat bed without using bedrails?: None Help needed moving from lying on your back to sitting on the side of a flat bed without using bedrails?: None Help needed moving to and from a bed to a chair (including a wheelchair)?: None Help needed standing up from a chair using your arms (e.g., wheelchair or bedside chair)?: None Help needed to walk in hospital room?: Total Help needed climbing 3-5 steps with a railing? : Total 6 Click Score: 18    End of Session Equipment Utilized During Treatment: Gait belt (because my WC did not have armrests, belt for safety) Activity Tolerance: Patient tolerated treatment well Patient left: in bed;with call bell/phone within reach (seated EOB to eat lunch)   PT Visit Diagnosis: Difficulty in walking, not elsewhere classified (R26.2)    Time: 7124-5809 PT Time Calculation (min) (ACUTE ONLY): 50 min   Charges:   PT Evaluation $PT Eval Moderate Complexity: 1 Mod PT Treatments $Therapeutic Activity: 8-22 mins $Wheel Chair Management: 8-22 mins   Corinna Capra, PT, DPT  Acute Rehabilitation #[phone] pager #[phone] office        10/17/2020, 12:26 PM

## 2020-10-17 NOTE — Progress Notes (Signed)
PROGRESS NOTE    Cory Palmer  PPJ:[id number] DOB: 1961-05-06 DOA: 10/10/2020 PCP: Patient, [name] Per    Chief Complaint  Patient presents with  . Abdominal Pain  . Chest Pain  . Oral Swelling    Brief Narrative:  This 60 years old male with PMH significant for bilateral BKA, anxiety disorder, bipolar, type II DM, hypertension presents in the ED with significant lower lip edema and odynophagia(pain while swallowing).  Patient lives alone, is able to do his activities of daily living independently.  Patient reports having excruciating burning pain in his throat radiating towards the stomach after drinking orange juice 2 days ago since then he has been experiencing persistent moderate to severe burning pain that gets worse with swallowing.  Patient also reports he has not been able to take his home medication because of the pain.  EMS was called by his neighbor when he went to see him and found him with severe lip swelling.  Patient denies any fall or trauma to the lip or face.  Soft tissue CT neck showed no obstruction,  CT abdomen pelvis without any significant changes.  Patient is found to have severe leukocytosis which could be reactive.  GI consulted, scheduled to have EGD but it was canceled due to patient having significant lower lip swelling.    Subjective:  lower lip swelling is improving,  Odynophagia improved , less pain No bm for several days    Assessment & Plan:   Principal Problem:   Leukocytosis Active Problems:   Diabetes mellitus type II, non insulin dependent (HCC)   Depressed bipolar disorder (HCC)   Hypertension   Below-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)   Cirrhosis of liver (HCC)   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HCC)   GERD (gastroesophageal reflux disease)  Odynophagia due to severe esophagitis -Patient reports severe burning pain in the throat radiating towards the stomach after drinking orange juice 2 days ago and not able to  take his medication due to pain while swallowing. -Continue antiacid therapy with sucralfate 4 times daily and twice daily IV pantoprazole 40 mg daily. -GI consulted, patient would benefit from EGD however not able to perform due to significant lower lip swelling I have discussed with GI Dr. Elnoria Howard who did not think DG esophagus will change management, cancel DG esophagus  Lower lip swelling -He denies trauma or fall -Denies allergy -reports lower teeth pulled  Three weeks ago without issues -He received vancx1, then ceftriaxone for 5 days - C1 esterase inhibitor level wnl - due to persistent lower lips  swelling,  ENT Dr Suszanne Conners consulted who recommend clindamycin and decadron -he is improving, he is to follow up with Dr Suszanne Conners  Leukocytosis/lactic acidosis /AKI/hyponatremia Resolved after hydration/abx  Hypokalemia/hypomagnesemia, improved, monitor  Thrombocytopenia Unclear etiology CT ab dose show "Grossly cirrhotic morphology of the liver with stigmata of portal hypertension including splenomegaly and gastroesophageal varices" No sign of bleeding B12 folate unremarkable Platelet nadir at 75, appear improving Monitor CBC  Noninsulin-dependent type 2 diabetes, controlled A1c 6.6 Home medication Metformin held Hyperglycemia due to steroid, start Lantus, add on meal coverage, continue SSI  Bilateral BKA Report lives by self and use wheelchair  Constipation Increase stool softener, may need enema     Unresulted Labs (From admission, onward)          Start     Ordered   10/17/20 0500  Creatinine, serum  (enoxaparin (LOVENOX)    CrCl >/= 30 ml/min)  Weekly,   R     Comments: while on enoxaparin therapy    10/10/20 1816   10/16/20 0500  Basic metabolic panel  Daily,   R      10/15/20 1435            DVT prophylaxis: enoxaparin (LOVENOX) injection 40 mg Start: 10/10/20 1915   Code Status: Full Family Communication: Patient Disposition:   Status is:  Inpatient  Dispo: The patient is from: Home              Anticipated d/c is to: To be determined              Anticipated d/c date is: To be determined                Consultants:   GI Dr. Elnoria Howard  ENT Dr Suszanne Conners  Procedures:   None  Antimicrobials:   Anti-infectives (From admission, onward)   Start     Dose/Rate Route Frequency Ordered Stop   10/11/20 1400  cefTRIAXone (ROCEPHIN) 2 g in sodium chloride 0.9 % 100 mL IVPB        2 g 200 mL/hr over 30 Minutes Intravenous Every 24 hours 10/11/20 1233 10/15/20 1353   10/10/20 2055  vancomycin variable dose per unstable renal function (pharmacist dosing)  Status:  Discontinued         Does not apply See admin instructions 10/10/20 2055 10/11/20 1115   10/10/20 1515  vancomycin variable dose per unstable renal function (pharmacist dosing)  Status:  Discontinued         Does not apply See admin instructions 10/10/20 1515 10/10/20 1816   10/10/20 1445  ceFEPIme (MAXIPIME) 2 g in sodium chloride 0.9 % 100 mL IVPB        2 g 200 mL/hr over 30 Minutes Intravenous  Once 10/10/20 1435 10/10/20 1502   10/10/20 1445  vancomycin (VANCOREADY) IVPB 1750 mg/350 mL        1,750 mg 175 mL/hr over 120 Minutes Intravenous  Once 10/10/20 1444 10/10/20 1819          Objective: Vitals:   10/16/20 0414 10/16/20 1514 10/16/20 1954 10/17/20 0524  BP: 134/72 125/65 123/60 124/76  Pulse: 74 68 65 62  Resp: 17  18 17   Temp: 98.1 F (36.7 C) 97.8 F (36.6 C) 98.2 F (36.8 C) 98.3 F (36.8 C)  TempSrc: Oral     SpO2: 99% 98% 98% 98%    Intake/Output Summary (Last 24 hours) at 10/17/2020 1118 Last data filed at 10/17/2020 0800 Gross per 24 hour  Intake 937 ml  Output 1150 ml  Net -213 ml   There were no vitals filed for this visit.  Examination:  General exam: Lower lip swelling is improving, calm, NAD Respiratory system: Clear to auscultation. Respiratory effort normal. Cardiovascular system: S1 & S2 heard, RRR. No JVD, no murmur, No pedal  edema. Gastrointestinal system: Abdomen is nondistended, soft and nontender.  Normal bowel sounds heard. Central nervous system: Alert and oriented. No focal neurological deficits. Extremities: bilateral aka Skin: No rashes, lesions or ulcers Psychiatry: Judgement and insight appear normal. Mood & affect appropriate.         Data Reviewed: I have personally reviewed following labs and imaging studies  CBC: Recent Labs  Lab 10/10/20 1335 10/11/20 0357 10/12/20 0249 10/14/20 0238 10/15/20 0125 10/17/20 0026  WBC 32.4* 15.7* 7.4 7.9 6.6 9.5  NEUTROABS 25.3*  --   --   --  4.3  --  HGB 14.6 11.9* 10.3* 10.5* 10.7* 10.0*  HCT 41.2 34.1* 30.1* 28.9* 30.4* 27.8*  MCV 92.8 94.5 96.5 93.8 93.8 93.6  PLT 184 89* 75* 83* 89* 90*    Basic Metabolic Panel: Recent Labs  Lab 10/12/20 0249 10/13/20 0356 10/14/20 0238 10/15/20 0125 10/16/20 0132 10/17/20 0026  NA 133* 135 138 139 132* 133*  K 3.4* 3.4* 3.4* 3.3* 3.9 4.0  CL 108 106 109 108 105 106  CO2 22 21* 20* 20* 19* 19*  GLUCOSE 117* 123* 119* 103* 257* 329*  BUN 13 9 5* 6 9 18   CREATININE 0.73 0.78 0.71 0.72 0.70 0.85  CALCIUM 8.0* 8.1* 8.3* 8.5* 8.7* 8.7*  MG 1.7  --   --  1.5* 1.8 1.8  PHOS 2.7  --   --   --   --   --     GFR: CrCl cannot be calculated (Unknown ideal weight.).  Liver Function Tests: Recent Labs  Lab 10/10/20 1226 10/15/20 0125  AST 34 21  ALT 20 15  ALKPHOS 86 89  BILITOT 3.4* 1.2  PROT 7.4 5.7*  ALBUMIN 3.9 2.7*    CBG: Recent Labs  Lab 10/16/20 0743 10/16/20 1219 10/16/20 1656 10/16/20 2106 10/17/20 0736  GLUCAP 226* 210* 247* 280* 250*     Recent Results (from the past 240 hour(s))  Culture, blood (routine x 2)     Status: Abnormal   Collection Time: 10/10/20  1:49 PM   Specimen: BLOOD  Result Value Ref Range Status   Specimen Description BLOOD SITE NOT SPECIFIED  Final   Special Requests   Final    BOTTLES DRAWN AEROBIC AND ANAEROBIC Blood Culture adequate volume    Culture  Setup Time   Final    GRAM POSITIVE COCCI IN CHAINS AEROBIC BOTTLE ONLY CRITICAL RESULT CALLED TO, READ BACK BY AND VERIFIED WITH: PHARMD G.BARR AT 1222 ON 10/11/2020 BY T.SAAD    Culture (A)  Final    STREPTOCOCCUS MITIS/ORALIS THE SIGNIFICANCE OF ISOLATING THIS ORGANISM FROM A SINGLE SET OF BLOOD CULTURES WHEN MULTIPLE SETS ARE DRAWN IS UNCERTAIN. PLEASE NOTIFY THE MICROBIOLOGY DEPARTMENT WITHIN ONE WEEK IF SPECIATION AND SENSITIVITIES ARE REQUIRED. Performed at [facility], [address] [place], [place]    Report Status 10/14/2020 FINAL  Final  Culture, blood (routine x 2)     Status: None   Collection Time: 10/10/20  1:49 PM   Specimen: BLOOD  Result Value Ref Range Status   Specimen Description BLOOD SITE NOT SPECIFIED  Final   Special Requests   Final    BOTTLES DRAWN AEROBIC AND ANAEROBIC Blood Culture adequate volume   Culture   Final    NO GROWTH 5 DAYS Performed at [facility], [address], [place], [place]    Report Status 10/15/2020 FINAL  Final  Blood Culture ID Panel (Reflexed)     Status: Abnormal   Collection Time: 10/10/20  1:49 PM  Result Value Ref Range Status   Enterococcus faecalis NOT DETECTED NOT DETECTED Final   Enterococcus Faecium NOT DETECTED NOT DETECTED Final   Listeria monocytogenes NOT DETECTED NOT DETECTED Final   Staphylococcus species NOT DETECTED NOT DETECTED Final   Staphylococcus aureus (BCID) NOT DETECTED NOT DETECTED Final   Staphylococcus epidermidis NOT DETECTED NOT DETECTED Final   Staphylococcus lugdunensis NOT DETECTED NOT DETECTED Final   Streptococcus species DETECTED (A) NOT DETECTED Final    Comment: Not Enterococcus species, Streptococcus agalactiae, Streptococcus pyogenes,  or Streptococcus pneumoniae. CRITICAL RESULT CALLED TO, READ BACK BY AND VERIFIED WITH: PHARMD G.BARR AT 1222 ON 10/11/2020 BY T.SAAD    Streptococcus agalactiae NOT DETECTED NOT DETECTED Final   Streptococcus  pneumoniae NOT DETECTED NOT DETECTED Final   Streptococcus pyogenes NOT DETECTED NOT DETECTED Final   A.calcoaceticus-baumannii NOT DETECTED NOT DETECTED Final   Bacteroides fragilis NOT DETECTED NOT DETECTED Final   Enterobacterales NOT DETECTED NOT DETECTED Final   Enterobacter cloacae complex NOT DETECTED NOT DETECTED Final   Escherichia coli NOT DETECTED NOT DETECTED Final   Klebsiella aerogenes NOT DETECTED NOT DETECTED Final   Klebsiella oxytoca NOT DETECTED NOT DETECTED Final   Klebsiella pneumoniae NOT DETECTED NOT DETECTED Final   Proteus species NOT DETECTED NOT DETECTED Final   Salmonella species NOT DETECTED NOT DETECTED Final   Serratia marcescens NOT DETECTED NOT DETECTED Final   Haemophilus influenzae NOT DETECTED NOT DETECTED Final   Neisseria meningitidis NOT DETECTED NOT DETECTED Final   Pseudomonas aeruginosa NOT DETECTED NOT DETECTED Final   Stenotrophomonas maltophilia NOT DETECTED NOT DETECTED Final   Candida albicans NOT DETECTED NOT DETECTED Final   Candida auris NOT DETECTED NOT DETECTED Final   Candida glabrata NOT DETECTED NOT DETECTED Final   Candida krusei NOT DETECTED NOT DETECTED Final   Candida parapsilosis NOT DETECTED NOT DETECTED Final   Candida tropicalis NOT DETECTED NOT DETECTED Final   Cryptococcus neoformans/gattii NOT DETECTED NOT DETECTED Final    Comment: Performed at [facility], [address] [place], [place] [address]  Resp Panel by RT-PCR (Flu A&B, Covid) Nasopharyngeal Swab     Status: None   Collection Time: 10/10/20  2:58 PM   Specimen: Nasopharyngeal Swab; Nasopharyngeal(NP) swabs in vial transport medium  Result Value Ref Range Status   SARS Coronavirus 2 by RT PCR NEGATIVE NEGATIVE Final    Comment: (NOTE) SARS-CoV-2 target nucleic acids are NOT DETECTED.  The SARS-CoV-2 RNA is generally detectable in upper respiratory specimens during the acute phase of infection. The lowest concentration of SARS-CoV-2 viral copies this  assay can detect is 138 copies/mL. A negative result does not preclude SARS-Cov-2 infection and should not be used as the sole basis for treatment or other patient management decisions. A negative result may occur with  improper specimen collection/handling, submission of specimen other than nasopharyngeal swab, presence of viral mutation(s) within the areas targeted by this assay, and inadequate number of viral copies(<138 copies/mL). A negative result must be combined with clinical observations, patient history, and epidemiological information. The expected result is Negative.  Fact Sheet for Patients:  BloggerCourse.comhttps://www.fda.gov/media/152166/download  Fact Sheet for Healthcare Providers:  SeriousBroker.ithttps://www.fda.gov/media/152162/download  This test is no t yet approved or cleared by the [place] FDA and  has been authorized for detection and/or diagnosis of SARS-CoV-2 by FDA under an Emergency Use Authorization (EUA). This EUA will remain  in effect (meaning this test can be used) for the duration of the COVID-19 declaration under Section 564(b)(1) of the Act, 21 U.S.C.section 360bbb-3(b)(1), unless the authorization is terminated  or revoked sooner.       Influenza A by PCR NEGATIVE NEGATIVE Final   Influenza B by PCR NEGATIVE NEGATIVE Final    Comment: (NOTE) The Xpert Xpress SARS-CoV-2/FLU/RSV plus assay is intended as an aid in the diagnosis of influenza from Nasopharyngeal swab specimens and should not be used as a sole basis for treatment. Nasal washings and aspirates are unacceptable for Xpert Xpress SARS-CoV-2/FLU/RSV testing.  Fact Sheet for Patients: BloggerCourse.comhttps://www.fda.gov/media/152166/download  Fact Sheet for Healthcare Providers: SeriousBroker.it  This test is not yet approved or cleared by the [place] FDA and has been authorized for detection and/or diagnosis of SARS-CoV-2 by FDA under an Emergency Use Authorization (EUA). This EUA will  remain in effect (meaning this test can be used) for the duration of the COVID-19 declaration under Section 564(b)(1) of the Act, 21 U.S.C. section 360bbb-3(b)(1), unless the authorization is terminated or revoked.  Performed at [facility], [address], [place], [place] [address]   Group A Strep by PCR     Status: None   Collection Time: 10/10/20  2:58 PM   Specimen: Nasopharyngeal Swab; Sterile Swab  Result Value Ref Range Status   Group A Strep by PCR NOT DETECTED NOT DETECTED Final    Comment: Performed at [facility], [address], [place], [place] [address]  Urine culture     Status: Abnormal   Collection Time: 10/11/20  4:54 AM   Specimen: Urine, Random  Result Value Ref Range Status   Specimen Description URINE, RANDOM  Final   Special Requests   Final    NONE Performed at [facility], [address], [place], [place] [address]    Culture MULTIPLE SPECIES PRESENT, SUGGEST RECOLLECTION (A)  Final   Report Status 10/12/2020 FINAL  Final         Radiology Studies: No results found.      Scheduled Meds: . bisacodyl  10 mg Rectal Daily  . dexamethasone (DECADRON) injection  10 mg Intravenous Q12H  . enoxaparin (LOVENOX) injection  40 mg Subcutaneous Q24H  . ferrous sulfate  325 mg Oral Q breakfast  . insulin aspart  0-9 Units Subcutaneous TID WC  . insulin aspart  3 Units Subcutaneous TID WC  . insulin glargine  8 Units Subcutaneous BID  . multivitamin with minerals  1 tablet Oral Daily  . pantoprazole (PROTONIX) IV  40 mg Intravenous Q12H  . QUEtiapine  300 mg Oral QHS  . senna-docusate  1 tablet Oral BID  . sertraline  50 mg Oral Daily  . sucralfate  1 g Oral TID WC & HS  . traZODone  150 mg Oral QHS   Continuous Infusions:    LOS: 6 days   Time spent: Greater than 50% of this time was spent in counseling, explanation of diagnosis, planning of further management, and coordination of care.   Voice Recognition Reubin [place]  dictation system was used to create this note, attempts have been made to correct errors. Please contact the author with questions and/or clarifications.   Albertine Grates, MD PhD FACP [facility]  Available via Epic secure chat 7am-7pm for nonurgent issues Please page for urgent issues To page the attending provider between 7A-7P or the covering provider during after hours 7P-7A, please log into the web site [email or web address] and access using universal [place] password for that web site. If you do not have the password, please call the hospital operator.    10/17/2020, 11:18 AM

## 2020-10-18 DIAGNOSIS — S88111A Complete traumatic amputation at level between knee and ankle, right lower leg, initial encounter: Secondary | ICD-10-CM | POA: Diagnosis not present

## 2020-10-18 DIAGNOSIS — D72829 Elevated white blood cell count, unspecified: Secondary | ICD-10-CM | POA: Diagnosis not present

## 2020-10-18 DIAGNOSIS — S88112A Complete traumatic amputation at level between knee and ankle, left lower leg, initial encounter: Secondary | ICD-10-CM | POA: Diagnosis not present

## 2020-10-18 DIAGNOSIS — K209 Esophagitis, unspecified without bleeding: Secondary | ICD-10-CM | POA: Diagnosis not present

## 2020-10-18 LAB — BASIC METABOLIC PANEL
Anion gap: 7 (ref 5–15)
BUN: 20 mg/dL (ref 6–20)
CO2: 21 mmol/L — ABNORMAL LOW (ref 22–32)
Calcium: 8.5 mg/dL — ABNORMAL LOW (ref 8.9–10.3)
Chloride: 102 mmol/L (ref 98–111)
Creatinine, Ser: 0.78 mg/dL (ref 0.61–1.24)
GFR, Estimated: >60 mL/min (ref >60)
Glucose, Bld: 332 mg/dL — ABNORMAL HIGH (ref 70–99)
Potassium: 4.2 mmol/L (ref 3.5–5.1)
Sodium: 130 mmol/L — ABNORMAL LOW (ref 135–145)

## 2020-10-18 LAB — GLUCOSE, CAPILLARY
Glucose-Capillary: 286 mg/dL — ABNORMAL HIGH (ref 70–99)
Glucose-Capillary: 326 mg/dL — ABNORMAL HIGH (ref 70–99)
Glucose-Capillary: 338 mg/dL — ABNORMAL HIGH (ref 70–99)
Glucose-Capillary: 366 mg/dL — ABNORMAL HIGH (ref 70–99)

## 2020-10-18 MED ORDER — DEXAMETHASONE SODIUM PHOSPHATE 10 MG/ML IJ SOLN
10.0000 mg | INTRAMUSCULAR | Status: DC
  Administered 2020-10-19: 10 mg via INTRAVENOUS
  Filled 2020-10-18: qty 1

## 2020-10-18 MED ORDER — BISACODYL 5 MG PO TBEC
10.0000 mg | DELAYED_RELEASE_TABLET | Freq: Every day | ORAL | Status: DC
  Administered 2020-10-18 – 2020-10-20 (×2): 10 mg via ORAL
  Filled 2020-10-18 (×2): qty 2

## 2020-10-18 MED ORDER — INSULIN ASPART 100 UNIT/ML ~~LOC~~ SOLN
4.0000 [IU] | Freq: Three times a day (TID) | SUBCUTANEOUS | Status: DC
  Administered 2020-10-18 – 2020-10-20 (×7): 4 [IU] via SUBCUTANEOUS

## 2020-10-18 MED ORDER — CLINDAMYCIN HCL 300 MG PO CAPS
300.0000 mg | ORAL_CAPSULE | Freq: Three times a day (TID) | ORAL | Status: DC
  Administered 2020-10-18 – 2020-10-20 (×7): 300 mg via ORAL
  Filled 2020-10-18 (×7): qty 1

## 2020-10-18 MED ORDER — INSULIN GLARGINE 100 UNIT/ML ~~LOC~~ SOLN
12.0000 [IU] | Freq: Two times a day (BID) | SUBCUTANEOUS | Status: DC
  Administered 2020-10-18 – 2020-10-20 (×5): 12 [IU] via SUBCUTANEOUS
  Filled 2020-10-18 (×6): qty 0.12

## 2020-10-18 NOTE — Progress Notes (Addendum)
PROGRESS NOTE    Cory Palmer  ZOX:[id number] DOB: September 20, 1960 DOA: 10/10/2020 PCP: Patient, [name] Per    Chief Complaint  Patient presents with  . Abdominal Pain  . Chest Pain  . Oral Swelling    Brief Narrative:  This 60 years old male with PMH significant for bilateral BKA, anxiety disorder, bipolar, type II DM, hypertension presents in the ED with significant lower lip edema and odynophagia(pain while swallowing).  Patient lives alone, is able to do his activities of daily living independently.  Patient reports having excruciating burning pain in his throat radiating towards the stomach after drinking orange juice 2 days ago since then he has been experiencing persistent moderate to severe burning pain that gets worse with swallowing.  Patient also reports he has not been able to take his home medication because of the pain.  EMS was called by his neighbor when he went to see him and found him with severe lip swelling.  Patient denies any fall or trauma to the lip or face.  Soft tissue CT neck showed no obstruction,  CT abdomen pelvis without any significant changes.  Patient is found to have severe leukocytosis which could be reactive.  GI consulted, scheduled to have EGD but it was canceled due to patient having significant lower lip swelling.    Subjective:  lower lip swelling continue to improve Odynophagia improved , less pain No bm for several days    Assessment & Plan:   Principal Problem:   Leukocytosis Active Problems:   Diabetes mellitus type II, non insulin dependent (HCC)   Depressed bipolar disorder (HCC)   Hypertension   Below-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)   Cirrhosis of liver (HCC)   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HCC)   GERD (gastroesophageal reflux disease)  Odynophagia due to severe esophagitis -Patient reports severe burning pain in the throat radiating towards the stomach after drinking orange juice 2 days ago and not  able to take his medication due to pain while swallowing. -Continue antiacid therapy with sucralfate 4 times daily and twice daily IV pantoprazole 40 mg daily. -GI consulted, patient would benefit from EGD however not able to perform due to significant lower lip swelling -lip swelling is improving, will touch base with GI Dr Elnoria Howard on Monday, will keep n.p.o. after midnight, in case GI decide to proceed with EGD  Lower lip swelling -He denies trauma or fall -Denies allergy -reports lower teeth pulled  Three weeks ago without issues -later, he told me It might be from spider bite -He received vancx1, then ceftriaxone for 5 days - C1 esterase inhibitor level wnl - due to persistent lower lips  swelling,  ENT Dr Suszanne Conners consulted who recommend clindamycin and decadron -he is improving, change to oral clindamycin ( total of 7 days clinda), taper steroids,  he is to follow up with Dr Suszanne Conners  Leukocytosis/lactic acidosis Bernerd Limbo Resolved after hydration/abx  Hyponatremia On presentation , improved with hydration, now hyponatremia again, likely due to hyperglycemia, adjust insulin  Hypokalemia/hypomagnesemia, improved, monitor  Thrombocytopenia Unclear etiology CT ab dose show "Grossly cirrhotic morphology of the liver with stigmata of portal hypertension including splenomegaly and gastroesophageal varices" No sign of bleeding B12 folate unremarkable Platelet nadir at 75, appear improving Monitor CBC  Noninsulin-dependent type 2 diabetes, controlled A1c 6.6 Home medication Metformin held Hyperglycemia due to steroid, taper steroids, increase Lantus, increase meal coverage, continue SSI  Bilateral BKA Report lives by self and use wheelchair  Constipation  Increase stool softener, may need enema     Unresulted Labs (From admission, onward)          Start     Ordered   10/19/20 0500  CBC with Differential/Platelet  Tomorrow morning,   R        10/18/20 0906   10/19/20 0500  Basic  metabolic panel  Tomorrow morning,   R        10/18/20 0906   10/19/20 0500  Magnesium  Tomorrow morning,   R        10/18/20 0906   10/17/20 0500  Creatinine, serum  (enoxaparin (LOVENOX)    CrCl >/= 30 ml/min)  Weekly,   R     Comments: while on enoxaparin therapy    10/10/20 1816            DVT prophylaxis: enoxaparin (LOVENOX) injection 40 mg Start: 10/10/20 1915   Code Status: Full Family Communication: Patient Disposition:   Status is: Inpatient  Dispo: The patient is from: Home              Anticipated d/c is to: home              Anticipated d/c date is: early next week if cleared by GI Dr Elnoria HowardHung                Consultants:   GI Dr. Elnoria HowardHung  ENT Dr Suszanne Connerseoh  Procedures:   None  Antimicrobials:   Anti-infectives (From admission, onward)   Start     Dose/Rate Route Frequency Ordered Stop   10/18/20 1400  clindamycin (CLEOCIN) capsule 300 mg        300 mg Oral Every 8 hours 10/18/20 0905 10/25/20 1359   10/11/20 1400  cefTRIAXone (ROCEPHIN) 2 g in sodium chloride 0.9 % 100 mL IVPB        2 g 200 mL/hr over 30 Minutes Intravenous Every 24 hours 10/11/20 1233 10/15/20 1353   10/10/20 2055  vancomycin variable dose per unstable renal function (pharmacist dosing)  Status:  Discontinued         Does not apply See admin instructions 10/10/20 2055 10/11/20 1115   10/10/20 1515  vancomycin variable dose per unstable renal function (pharmacist dosing)  Status:  Discontinued         Does not apply See admin instructions 10/10/20 1515 10/10/20 1816   10/10/20 1445  ceFEPIme (MAXIPIME) 2 g in sodium chloride 0.9 % 100 mL IVPB        2 g 200 mL/hr over 30 Minutes Intravenous  Once 10/10/20 1435 10/10/20 1502   10/10/20 1445  vancomycin (VANCOREADY) IVPB 1750 mg/350 mL        1,750 mg 175 mL/hr over 120 Minutes Intravenous  Once 10/10/20 1444 10/10/20 1819          Objective: Vitals:   10/17/20 0524 10/17/20 1406 10/17/20 2104 10/18/20 0428  BP: 124/76 (!) 132/51  132/67 (!) 145/79  Pulse: 62 (!) 56 (!) 58 (!) 50  Resp: 17 18 17 17   Temp: 98.3 F (36.8 C) 97.7 F (36.5 C) 98.6 F (37 C) 98.4 F (36.9 C)  TempSrc:  Oral    SpO2: 98% 98% 97% 97%    Intake/Output Summary (Last 24 hours) at 10/18/2020 0908 Last data filed at 10/17/2020 1407 Gross per 24 hour  Intake 237 ml  Output 550 ml  Net -313 ml   There were no vitals filed for this visit.  Examination:  General  exam: Lower lip swelling is improving, calm, NAD Respiratory system: Clear to auscultation. Respiratory effort normal. Cardiovascular system: S1 & S2 heard, RRR. No JVD, no murmur, No pedal edema. Gastrointestinal system: Abdomen is nondistended, soft and nontender.  Normal bowel sounds heard. Central nervous system: Alert and oriented. No focal neurological deficits. Extremities: bilateral aka Skin: No rashes, lesions or ulcers Psychiatry: Judgement and insight appear normal. Mood & affect appropriate.         Data Reviewed: I have personally reviewed following labs and imaging studies  CBC: Recent Labs  Lab 10/12/20 0249 10/14/20 0238 10/15/20 0125 10/17/20 0026  WBC 7.4 7.9 6.6 9.5  NEUTROABS  --   --  4.3  --   HGB 10.3* 10.5* 10.7* 10.0*  HCT 30.1* 28.9* 30.4* 27.8*  MCV 96.5 93.8 93.8 93.6  PLT 75* 83* 89* 90*    Basic Metabolic Panel: Recent Labs  Lab 10/12/20 0249 10/13/20 0356 10/14/20 0238 10/15/20 0125 10/16/20 0132 10/17/20 0026 10/18/20 0105  NA 133*   < > 138 139 132* 133* 130*  K 3.4*   < > 3.4* 3.3* 3.9 4.0 4.2  CL 108   < > 109 108 105 106 102  CO2 22   < > 20* 20* 19* 19* 21*  GLUCOSE 117*   < > 119* 103* 257* 329* 332*  BUN 13   < > 5* 6 9 18 20   CREATININE 0.73   < > 0.71 0.72 0.70 0.85 0.78  CALCIUM 8.0*   < > 8.3* 8.5* 8.7* 8.7* 8.5*  MG 1.7  --   --  1.5* 1.8 1.8  --   PHOS 2.7  --   --   --   --   --   --    < > = values in this interval not displayed.    GFR: CrCl cannot be calculated (Unknown ideal  weight.).  Liver Function Tests: Recent Labs  Lab 10/15/20 0125  AST 21  ALT 15  ALKPHOS 89  BILITOT 1.2  PROT 5.7*  ALBUMIN 2.7*    CBG: Recent Labs  Lab 10/17/20 0736 10/17/20 1156 10/17/20 1744 10/17/20 2107 10/18/20 0734  GLUCAP 250* 299* 345* 344* 286*     Recent Results (from the past 240 hour(s))  Culture, blood (routine x 2)     Status: Abnormal   Collection Time: 10/10/20  1:49 PM   Specimen: BLOOD  Result Value Ref Range Status   Specimen Description BLOOD SITE NOT SPECIFIED  Final   Special Requests   Final    BOTTLES DRAWN AEROBIC AND ANAEROBIC Blood Culture adequate volume   Culture  Setup Time   Final    GRAM POSITIVE COCCI IN CHAINS AEROBIC BOTTLE ONLY CRITICAL RESULT CALLED TO, READ BACK BY AND VERIFIED WITH: PHARMD G.BARR AT 1222 ON 10/11/2020 BY T.SAAD    Culture (A)  Final    STREPTOCOCCUS MITIS/ORALIS THE SIGNIFICANCE OF ISOLATING THIS ORGANISM FROM A SINGLE SET OF BLOOD CULTURES WHEN MULTIPLE SETS ARE DRAWN IS UNCERTAIN. PLEASE NOTIFY THE MICROBIOLOGY DEPARTMENT WITHIN ONE WEEK IF SPECIATION AND SENSITIVITIES ARE REQUIRED. Performed at [facility], [address] [place], [place]    Report Status 10/14/2020 FINAL  Final  Culture, blood (routine x 2)     Status: None   Collection Time: 10/10/20  1:49 PM   Specimen: BLOOD  Result Value Ref Range Status   Specimen Description BLOOD SITE NOT SPECIFIED  Final   Special Requests  Final    BOTTLES DRAWN AEROBIC AND ANAEROBIC Blood Culture adequate volume   Culture   Final    NO GROWTH 5 DAYS Performed at [facility], [address], [place], [place] [address]    Report Status 10/15/2020 FINAL  Final  Blood Culture ID Panel (Reflexed)     Status: Abnormal   Collection Time: 10/10/20  1:49 PM  Result Value Ref Range Status   Enterococcus faecalis NOT DETECTED NOT DETECTED Final   Enterococcus Faecium NOT DETECTED NOT DETECTED Final   Listeria monocytogenes NOT  DETECTED NOT DETECTED Final   Staphylococcus species NOT DETECTED NOT DETECTED Final   Staphylococcus aureus (BCID) NOT DETECTED NOT DETECTED Final   Staphylococcus epidermidis NOT DETECTED NOT DETECTED Final   Staphylococcus lugdunensis NOT DETECTED NOT DETECTED Final   Streptococcus species DETECTED (A) NOT DETECTED Final    Comment: Not Enterococcus species, Streptococcus agalactiae, Streptococcus pyogenes, or Streptococcus pneumoniae. CRITICAL RESULT CALLED TO, READ BACK BY AND VERIFIED WITH: PHARMD G.BARR AT 1222 ON 10/11/2020 BY T.SAAD    Streptococcus agalactiae NOT DETECTED NOT DETECTED Final   Streptococcus pneumoniae NOT DETECTED NOT DETECTED Final   Streptococcus pyogenes NOT DETECTED NOT DETECTED Final   A.calcoaceticus-baumannii NOT DETECTED NOT DETECTED Final   Bacteroides fragilis NOT DETECTED NOT DETECTED Final   Enterobacterales NOT DETECTED NOT DETECTED Final   Enterobacter cloacae complex NOT DETECTED NOT DETECTED Final   Escherichia coli NOT DETECTED NOT DETECTED Final   Klebsiella aerogenes NOT DETECTED NOT DETECTED Final   Klebsiella oxytoca NOT DETECTED NOT DETECTED Final   Klebsiella pneumoniae NOT DETECTED NOT DETECTED Final   Proteus species NOT DETECTED NOT DETECTED Final   Salmonella species NOT DETECTED NOT DETECTED Final   Serratia marcescens NOT DETECTED NOT DETECTED Final   Haemophilus influenzae NOT DETECTED NOT DETECTED Final   Neisseria meningitidis NOT DETECTED NOT DETECTED Final   Pseudomonas aeruginosa NOT DETECTED NOT DETECTED Final   Stenotrophomonas maltophilia NOT DETECTED NOT DETECTED Final   Candida albicans NOT DETECTED NOT DETECTED Final   Candida auris NOT DETECTED NOT DETECTED Final   Candida glabrata NOT DETECTED NOT DETECTED Final   Candida krusei NOT DETECTED NOT DETECTED Final   Candida parapsilosis NOT DETECTED NOT DETECTED Final   Candida tropicalis NOT DETECTED NOT DETECTED Final   Cryptococcus neoformans/gattii NOT DETECTED NOT  DETECTED Final    Comment: Performed at [facility], [address] [place], [place] [address]  Resp Panel by RT-PCR (Flu A&B, Covid) Nasopharyngeal Swab     Status: None   Collection Time: 10/10/20  2:58 PM   Specimen: Nasopharyngeal Swab; Nasopharyngeal(NP) swabs in vial transport medium  Result Value Ref Range Status   SARS Coronavirus 2 by RT PCR NEGATIVE NEGATIVE Final    Comment: (NOTE) SARS-CoV-2 target nucleic acids are NOT DETECTED.  The SARS-CoV-2 RNA is generally detectable in upper respiratory specimens during the acute phase of infection. The lowest concentration of SARS-CoV-2 viral copies this assay can detect is 138 copies/mL. A negative result does not preclude SARS-Cov-2 infection and should not be used as the sole basis for treatment or other patient management decisions. A negative result may occur with  improper specimen collection/handling, submission of specimen other than nasopharyngeal swab, presence of viral mutation(s) within the areas targeted by this assay, and inadequate number of viral copies(<138 copies/mL). A negative result must be combined with clinical observations, patient history, and epidemiological information. The expected result is Negative.  Fact Sheet for  Patients:  BloggerCourse.com  Fact Sheet for Healthcare Providers:  SeriousBroker.it  This test is no t yet approved or cleared by the [place] FDA and  has been authorized for detection and/or diagnosis of SARS-CoV-2 by FDA under an Emergency Use Authorization (EUA). This EUA will remain  in effect (meaning this test can be used) for the duration of the COVID-19 declaration under Section 564(b)(1) of the Act, 21 U.S.C.section 360bbb-3(b)(1), unless the authorization is terminated  or revoked sooner.       Influenza A by PCR NEGATIVE NEGATIVE Final   Influenza B by PCR NEGATIVE NEGATIVE Final    Comment: (NOTE) The Xpert  Xpress SARS-CoV-2/FLU/RSV plus assay is intended as an aid in the diagnosis of influenza from Nasopharyngeal swab specimens and should not be used as a sole basis for treatment. Nasal washings and aspirates are unacceptable for Xpert Xpress SARS-CoV-2/FLU/RSV testing.  Fact Sheet for Patients: BloggerCourse.com  Fact Sheet for Healthcare Providers: SeriousBroker.it  This test is not yet approved or cleared by the [place] FDA and has been authorized for detection and/or diagnosis of SARS-CoV-2 by FDA under an Emergency Use Authorization (EUA). This EUA will remain in effect (meaning this test can be used) for the duration of the COVID-19 declaration under Section 564(b)(1) of the Act, 21 U.S.C. section 360bbb-3(b)(1), unless the authorization is terminated or revoked.  Performed at [facility], [address] [place], [place] [address]   Group A Strep by PCR     Status: None   Collection Time: 10/10/20  2:58 PM   Specimen: Nasopharyngeal Swab; Sterile Swab  Result Value Ref Range Status   Group A Strep by PCR NOT DETECTED NOT DETECTED Final    Comment: Performed at [facility], [address] [place], [place] [address]  Urine culture     Status: Abnormal   Collection Time: 10/11/20  4:54 AM   Specimen: Urine, Random  Result Value Ref Range Status   Specimen Description URINE, RANDOM  Final   Special Requests   Final    NONE Performed at [facility], [address], [place], [place] [address]    Culture MULTIPLE SPECIES PRESENT, SUGGEST RECOLLECTION (A)  Final   Report Status 10/12/2020 FINAL  Final         Radiology Studies: No results found.      Scheduled Meds: . bisacodyl  10 mg Oral Daily  . bisacodyl  10 mg Rectal Daily  . clindamycin  300 mg Oral Q8H  . [START ON 10/19/2020] dexamethasone (DECADRON) injection  10 mg Intravenous Q24H  . enoxaparin (LOVENOX) injection  40 mg  Subcutaneous Q24H  . ferrous sulfate  325 mg Oral Q breakfast  . insulin aspart  0-9 Units Subcutaneous TID WC  . insulin aspart  4 Units Subcutaneous TID WC  . insulin glargine  12 Units Subcutaneous BID  . multivitamin with minerals  1 tablet Oral Daily  . pantoprazole (PROTONIX) IV  40 mg Intravenous Q12H  . polyethylene glycol  17 g Oral BID  . QUEtiapine  300 mg Oral QHS  . senna-docusate  1 tablet Oral BID  . sertraline  50 mg Oral Daily  . sucralfate  1 g Oral TID WC & HS  . traZODone  150 mg Oral QHS   Continuous Infusions:    LOS: 7 days   Time spent: Greater than 50% of this time was spent in counseling, explanation of diagnosis, planning of further  management, and coordination of care.   Voice Recognition Reubin Milan dictation system was used to create this note, attempts have been made to correct errors. Please contact the author with questions and/or clarifications.   Albertine Grates, MD PhD FACP [facility]  Available via Epic secure chat 7am-7pm for nonurgent issues Please page for urgent issues To page the attending provider between 7A-7P or the covering provider during after hours 7P-7A, please log into the web site [email or web address] and access using universal [place] password for that web site. If you do not have the password, please call the hospital operator.    10/18/2020, 9:08 AM

## 2020-10-19 ENCOUNTER — Encounter (HOSPITAL_COMMUNITY): Payer: Self-pay | Admitting: Internal Medicine

## 2020-10-19 ENCOUNTER — Encounter (HOSPITAL_COMMUNITY)
Admission: EM | Disposition: A | Discharge status: Home / Self Care | Payer: Self-pay | Source: Home / Self Care | Attending: Internal Medicine

## 2020-10-19 DIAGNOSIS — D72829 Elevated white blood cell count, unspecified: Secondary | ICD-10-CM | POA: Diagnosis not present

## 2020-10-19 HISTORY — PX: ESOPHAGOGASTRODUODENOSCOPY (EGD) WITH PROPOFOL: SHX5813

## 2020-10-19 LAB — BASIC METABOLIC PANEL
Anion gap: 7 (ref 5–15)
BUN: 22 mg/dL — ABNORMAL HIGH (ref 6–20)
CO2: 20 mmol/L — ABNORMAL LOW (ref 22–32)
Calcium: 8.5 mg/dL — ABNORMAL LOW (ref 8.9–10.3)
Chloride: 102 mmol/L (ref 98–111)
Creatinine, Ser: 0.76 mg/dL (ref 0.61–1.24)
GFR, Estimated: >60 mL/min (ref >60)
Glucose, Bld: 372 mg/dL — ABNORMAL HIGH (ref 70–99)
Potassium: 4.3 mmol/L (ref 3.5–5.1)
Sodium: 129 mmol/L — ABNORMAL LOW (ref 135–145)

## 2020-10-19 LAB — CBC WITH DIFFERENTIAL/PLATELET
Abs Immature Granulocytes: 0.44 10*3/uL — ABNORMAL HIGH (ref 0.00–0.07)
Basophils Absolute: 0 10*3/uL (ref 0.0–0.1)
Basophils Relative: 0 %
Eosinophils Absolute: 0 10*3/uL (ref 0.0–0.5)
Eosinophils Relative: 0 %
HCT: 29.1 % — ABNORMAL LOW (ref 39.0–52.0)
Hemoglobin: 10.4 g/dL — ABNORMAL LOW (ref 13.0–17.0)
Immature Granulocytes: 5 %
Lymphocytes Relative: 7 %
Lymphs Abs: 0.6 10*3/uL — ABNORMAL LOW (ref 0.7–4.0)
MCH: 33.7 pg (ref 26.0–34.0)
MCHC: 35.7 g/dL (ref 30.0–36.0)
MCV: 94.2 fL (ref 80.0–100.0)
Monocytes Absolute: 0.5 10*3/uL (ref 0.1–1.0)
Monocytes Relative: 6 %
Neutro Abs: 7.3 10*3/uL (ref 1.7–7.7)
Neutrophils Relative %: 82 %
Platelets: 86 10*3/uL — ABNORMAL LOW (ref 150–400)
RBC: 3.09 MIL/uL — ABNORMAL LOW (ref 4.22–5.81)
RDW: 13.7 % (ref 11.5–15.5)
WBC: 8.9 10*3/uL (ref 4.0–10.5)
nRBC: 0 % (ref 0.0–0.2)

## 2020-10-19 LAB — GLUCOSE, CAPILLARY
Glucose-Capillary: 261 mg/dL — ABNORMAL HIGH (ref 70–99)
Glucose-Capillary: 174 mg/dL — ABNORMAL HIGH (ref 70–99)
Glucose-Capillary: 196 mg/dL — ABNORMAL HIGH (ref 70–99)
Glucose-Capillary: 277 mg/dL — ABNORMAL HIGH (ref 70–99)
Glucose-Capillary: 323 mg/dL — ABNORMAL HIGH (ref 70–99)

## 2020-10-19 LAB — MAGNESIUM: Magnesium: 2.1 mg/dL (ref 1.7–2.4)

## 2020-10-19 SURGERY — ESOPHAGOGASTRODUODENOSCOPY (EGD) WITH PROPOFOL
Anesthesia: Moderate Sedation

## 2020-10-19 MED ORDER — LIDOCAINE VISCOUS HCL 2 % MT SOLN
OROMUCOSAL | Status: AC
  Filled 2020-10-19: qty 15

## 2020-10-19 MED ORDER — LIDOCAINE HCL URETHRAL/MUCOSAL 2 % EX GEL
CUTANEOUS | Status: DC | PRN
  Administered 2020-10-19: 1

## 2020-10-19 MED ORDER — MIDAZOLAM HCL (PF) 10 MG/2ML IJ SOLN
INTRAMUSCULAR | Status: DC | PRN
Start: 2020-10-19 — End: 2020-10-19
  Administered 2020-10-19 (×4): 2 mg via INTRAVENOUS

## 2020-10-19 MED ORDER — FENTANYL CITRATE (PF) 100 MCG/2ML IJ SOLN
INTRAMUSCULAR | Status: DC | PRN
  Administered 2020-10-19 (×4): 25 ug via INTRAVENOUS

## 2020-10-19 MED ORDER — MIDAZOLAM HCL (PF) 5 MG/ML IJ SOLN
INTRAMUSCULAR | Status: AC
  Filled 2020-10-19: qty 2

## 2020-10-19 MED ORDER — FENTANYL CITRATE (PF) 100 MCG/2ML IJ SOLN
INTRAMUSCULAR | Status: AC
  Filled 2020-10-19: qty 4

## 2020-10-19 SURGICAL SUPPLY — 15 items

## 2020-10-19 NOTE — Progress Notes (Signed)
Inpatient Diabetes Program Recommendations  AACE/ADA: New Consensus Statement on Inpatient Glycemic Control (2015)  Target Ranges:  Prepandial:   less than 140 mg/dL      Peak postprandial:   less than 180 mg/dL (1-2 hours)      Critically ill patients:  140 - 180 mg/dL   Lab Results  Component Value Date   GLUCAP 174 (H) 10/19/2020   HGBA1C 6.6 (H) 10/10/2020    Review of Glycemic Control  Diabetes history: DM 2 Outpatient Diabetes medications: None Current orders for Inpatient glycemic control: Lantus 12 units bid + Novolog 4 units tid meal coverage + Novolog 0-9 units tid  Inpatient Diabetes Program Recommendations:   Will continue to follow Noted steroids now discontinued and may need adjustment in insulin. -Add Novolog 0-5 units correction hs  Thank you, Judy E. Hanks, RN, MSN, CDE  Diabetes Coordinator Inpatient Glycemic Control Team Team Pager #[phone] (8am-5pm) 10/19/2020 2:44 PM    

## 2020-10-19 NOTE — Interval H&P Note (Signed)
History and Physical Interval Note:  10/19/2020 3:12 PM  Cory Palmer  has presented today for surgery, with the diagnosis of Abnormal CT scan.  The various methods of treatment have been discussed with the patient and family. After consideration of risks, benefits and other options for treatment, the patient has consented to  Procedure(s): ESOPHAGOGASTRODUODENOSCOPY (EGD) WITH PROPOFOL (N/A) as a surgical intervention.  The patient's history has been reviewed, patient examined, no change in status, stable for surgery.  I have reviewed the patient's chart and labs.  Questions were answered to the patient's satisfaction.     Charna Elizabeth

## 2020-10-19 NOTE — Progress Notes (Signed)
Subjective: No new issues over the weekend. The lip swelling has slowly decreased.  Objective: Vital signs in last 24 hours: Temp:  [98 F (36.7 C)-98.7 F (37.1 C)] 98 F (36.7 C) (03/21 0421) Pulse Rate:  [51-56] 51 (03/21 0421) Resp:  [17-20] 17 (03/21 0421) BP: (128-144)/(64-79) 144/79 (03/21 0421) SpO2:  [97 %-100 %] 97 % (03/21 0421)  Physical Exam: General appearance:alert and cooperative Head:Normocephalic, without obvious abnormality, atraumatic Eyes: Pupils are equal, round, reactive to light. Extraocular motion is intact.  Ears: Examination of the ears shows normal auricles and external auditory canals bilaterally.  Nose: Nasal examination shows normal mucosa, septum, turbinates.  Face: Facial examination shows no asymmetry. Palpation of the face elicit no significant tenderness.  Mouth:Moderate lower lip edema. Dry scab noted on the lip. Poor dentition. The rest of oral cavity and pharynx are clear. Neck: Palpation of the neck reveals no mass. The trachea is midline.    Recent Labs    10/17/20 0026 10/19/20 0140  WBC 9.5 8.9  HGB 10.0* 10.4*  HCT 27.8* 29.1*  PLT 90* 86*   Recent Labs    10/18/20 0105 10/19/20 0140  NA 130* 129*  K 4.2 4.3  CL 102 102  CO2 21* 20*  GLUCOSE 332* 372*  BUN 20 22*  CREATININE 0.78 0.76  CALCIUM 8.5* 8.5*    Medications:  I have reviewed the patient's current medications. Scheduled: . bisacodyl  10 mg Oral Daily  . clindamycin  300 mg Oral Q8H  . dexamethasone (DECADRON) injection  10 mg Intravenous Q24H  . enoxaparin (LOVENOX) injection  40 mg Subcutaneous Q24H  . ferrous sulfate  325 mg Oral Q breakfast  . insulin aspart  0-9 Units Subcutaneous TID WC  . insulin aspart  4 Units Subcutaneous TID WC  . insulin glargine  12 Units Subcutaneous BID  . multivitamin with minerals  1 tablet Oral Daily  . pantoprazole (PROTONIX) IV  40 mg Intravenous Q12H  . polyethylene glycol  17 g Oral BID  . QUEtiapine  300 mg  Oral QHS  . senna-docusate  1 tablet Oral BID  . sertraline  50 mg Oral Daily  . sucralfate  1 g Oral TID WC & HS  . traZODone  150 mg Oral QHS   Continuous:   Assessment/Plan: Lower lip edema of unknown etiology - likely autoimmune versus infectious. - Continue oral clindamycin as scheduled. - Pt may follow up with me as an outpatient after discharge.    LOS: 8 days   Su W Teoh 10/19/2020, 7:36 AM

## 2020-10-19 NOTE — Progress Notes (Signed)
PROGRESS NOTE    ABDOULAYE DRUM  JJH:[id number] DOB: 10-23-1960 DOA: 10/10/2020 PCP: Patient, [name] Per   Chief Complain: Abdominal pain, chest pain, lower lip swelling  Brief Narrative:  Patient is a 60 year old male with history of bilateral BKA, anxiety disorder, bipolar disorder, type 2 diabetes, hypertension who presented to the emergency room with complaints of lower lip edema and RN aphasia.  Patient lives alone, is able to do his activities of daily living independently.  Patient was complaining of excruciating burning pain in the throat radiating towards the stomach .  He was unable to take his home medication because of pain.  He was also found to have severe lower lip swelling.  CT soft tissue neck on admission did not show any obstruction.  GI was consulted for possible EGD but it was canceled due to significant lower lip swelling.  ENT consulted for that.  Lower lip swelling has been improving with the steroids and antibiotics.  GI reconsulted today for possible EGD.  Assessment & Plan:   Principal Problem:   Leukocytosis Active Problems:   Diabetes mellitus type II, non insulin dependent (HCC)   Depressed bipolar disorder (HCC)   Hypertension   Below-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)   Cirrhosis of liver (HCC)   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HCC)   GERD (gastroesophageal reflux disease)   Severe odynophagia: Suspected to be from severe esophagitis.  Reported burning pain in the throat radiating towards the stomach after drinking orange juice.  Continue Carafate, IV pantoprazole twice daily.  GI was initially consulted and plan for EGD but was postponed due to lower lip swelling.  GI reconsulted today.  Dr. Elnoria Howard seeing the patient.  Lower lip swelling: Significantly improved now.  Denies any trauma, follow allergy.  Report of lower teeth being pulled out about 3 weeks ago.  C1 esterase inhibitor level normal.  Due to persistent  lip swelling,  ENT consulted who recommended clindamycin ,also on Decadron.  We will dc steroid.  Plan for 7 days course of clindamycin.  Leukocytosis/lactic//AKI: Resolved with IV fluids, antibiotics  Hyponatremia: Mild,likely contributed from hyperglycemia.  Continue to monitor  Hypokalemia/hypomagnesemia: Continue monitoring and supplementation  Thrombocytopenia:   CT abdomen showed grossly cirrhotic morphology of liver with stigmata of portal hypertension including splenomegaly and gastric varices.  No sign of bleeding.  Vitamin B-12, folate normal.  Monitor as an outpatient  Diabetes type 2/hyperglycemia: Hemoglobin C of 6.6.  Blood sugars have been uncontrolled most likely due to steroids now stopped.  Continue current insulin regimen.  Takes Metformin at home.    Bilateral BKA: Ambulates with help of wheelchair.  Lives independently  Constipation: Continue bowel regimen            DVT prophylaxis:Lovenox Code Status: Full Family Communication: None at bedside Status is: Inpatient  Remains inpatient appropriate because:Inpatient level of care appropriate due to severity of illness   Dispo: The patient is from: Home              Anticipated d/c is to: Home              Patient currently is not medically stable to d/c.   Difficult to place patient No     Consultants: GI,ENT  Procedures:None  Antimicrobials:  Anti-infectives (From admission, onward)   Start     Dose/Rate Route Frequency Ordered Stop   10/18/20 1000  clindamycin (CLEOCIN) capsule 300 mg        300  mg Oral Every 8 hours 10/18/20 0905 10/25/20 1359   10/11/20 1400  cefTRIAXone (ROCEPHIN) 2 g in sodium chloride 0.9 % 100 mL IVPB        2 g 200 mL/hr over 30 Minutes Intravenous Every 24 hours 10/11/20 1233 10/15/20 1353   10/10/20 2055  vancomycin variable dose per unstable renal function (pharmacist dosing)  Status:  Discontinued         Does not apply See admin instructions 10/10/20 2055 10/11/20 1115    10/10/20 1515  vancomycin variable dose per unstable renal function (pharmacist dosing)  Status:  Discontinued         Does not apply See admin instructions 10/10/20 1515 10/10/20 1816   10/10/20 1445  ceFEPIme (MAXIPIME) 2 g in sodium chloride 0.9 % 100 mL IVPB        2 g 200 mL/hr over 30 Minutes Intravenous  Once 10/10/20 1435 10/10/20 1502   10/10/20 1445  vancomycin (VANCOREADY) IVPB 1750 mg/350 mL        1,750 mg 175 mL/hr over 120 Minutes Intravenous  Once 10/10/20 1444 10/10/20 1819      Subjective:  Patient seen and examined the bedside this morning.  Hemodynamically stable and comfortable.  Lower lip swelling has significantly improved denies any complaints.  Objective: Vitals:   10/18/20 0428 10/18/20 1354 10/18/20 2042 10/19/20 0421  BP: (!) 145/79 130/74 128/64 (!) 144/79  Pulse: (!) 50 (!) 54 (!) 56 (!) 51  Resp: Temp: 98.4 F (36.9 C) 98.2 F (36.8 C) 98.7 F (37.1 C) 98 F (36.7 C)  TempSrc:   Oral Oral  SpO2: 97% 100% 98% 97%   No intake or output data in the 24 hours ending 10/19/20 1158 There were no vitals filed for this visit.  Examination:  General exam: Appears calm and comfortable ,Not in distress,average built HEENT: Edema of the lower lip, blackish discoloration of the lower lip Respiratory system: Bilateral equal air entry, normal vesicular breath sounds, no wheezes or crackles  Cardiovascular system: S1 & S2 heard, RRR. No JVD, murmurs, rubs, gallops or clicks. No pedal edema. Gastrointestinal system: Abdomen is nondistended, soft and nontender. No organomegaly or masses felt. Normal bowel sounds heard. Central nervous system: Alert and oriented. No focal neurological deficits. Extremities: B/L BKA Skin: No rashes, lesions or ulcers,no icterus ,no pallor   Data Reviewed: I have personally reviewed following labs and imaging studies  CBC: Recent Labs  Lab 10/14/20 0238 10/15/20 0125 10/17/20 0026 10/19/20 0140  WBC 7.9 6.6  9.5 8.9  NEUTROABS  --  4.3  --  7.3  HGB 10.5* 10.7* 10.0* 10.4*  HCT 28.9* 30.4* 27.8* 29.1*  MCV 93.8 93.8 93.6 94.2  PLT 83* 89* 90* 86*   Basic Metabolic Panel: Recent Labs  Lab 10/15/20 0125 10/16/20 0132 10/17/20 0026 10/18/20 0105 10/19/20 0140  NA 139 132* 133* 130* 129*  K 3.3* 3.9 4.0 4.2 4.3  CL 108 105 106 102 102  CO2 20* 19* 19* 21* 20*  GLUCOSE 103* 257* 329* 332* 372*  BUN 22*  CREATININE 0.72 0.70 0.85 0.78 0.76  CALCIUM 8.5* 8.7* 8.7* 8.5* 8.5*  MG 1.5* 1.8 1.8  --  2.1   GFR: CrCl cannot be calculated (Unknown ideal weight.). Liver Function Tests: Recent Labs  Lab 10/15/20 0125  AST 21  ALT 15  ALKPHOS 89  BILITOT 1.2  PROT 5.7*  ALBUMIN 2.7*   No results for  input(s): LIPASE, AMYLASE in the last 168 hours. No results for input(s): AMMONIA in the last 168 hours. Coagulation Profile: No results for input(s): INR, PROTIME in the last 168 hours. Cardiac Enzymes: No results for input(s): CKTOTAL, CKMB, CKMBINDEX, TROPONINI in the last 168 hours. BNP (last 3 results) No results for input(s): PROBNP in the last 8760 hours. HbA1C: No results for input(s): HGBA1C in the last 72 hours. CBG: Recent Labs  Lab 10/18/20 0734 10/18/20 1222 10/18/20 1645 10/18/20 2133 10/19/20 0725  GLUCAP 286* 326* 338* 366* 261*   Lipid Profile: No results for input(s): CHOL, HDL, LDLCALC, TRIG, CHOLHDL, LDLDIRECT in the last 72 hours. Thyroid Function Tests: No results for input(s): TSH, T4TOTAL, FREET4, T3FREE, THYROIDAB in the last 72 hours. Anemia Panel: No results for input(s): VITAMINB12, FOLATE, FERRITIN, TIBC, IRON, RETICCTPCT in the last 72 hours. Sepsis Labs: No results for input(s): PROCALCITON, LATICACIDVEN in the last 168 hours.  Recent Results (from the past 240 hour(s))  Culture, blood (routine x 2)     Status: Abnormal   Collection Time: 10/10/20  1:49 PM   Specimen: BLOOD  Result Value Ref Range Status   Specimen Description  BLOOD SITE NOT SPECIFIED  Final   Special Requests   Final    BOTTLES DRAWN AEROBIC AND ANAEROBIC Blood Culture adequate volume   Culture  Setup Time   Final    GRAM POSITIVE COCCI IN CHAINS AEROBIC BOTTLE ONLY CRITICAL RESULT CALLED TO, READ BACK BY AND VERIFIED WITH: PHARMD G.BARR AT 1222 ON 10/11/2020 BY T.SAAD    Culture (A)  Final    STREPTOCOCCUS MITIS/ORALIS THE SIGNIFICANCE OF ISOLATING THIS ORGANISM FROM A SINGLE SET OF BLOOD CULTURES WHEN MULTIPLE SETS ARE DRAWN IS UNCERTAIN. PLEASE NOTIFY THE MICROBIOLOGY DEPARTMENT WITHIN ONE WEEK IF SPECIATION AND SENSITIVITIES ARE REQUIRED. Performed at [facility], [address], [place], [place] [address]    Report Status 10/14/2020 FINAL  Final  Culture, blood (routine x 2)     Status: None   Collection Time: 10/10/20  1:49 PM   Specimen: BLOOD  Result Value Ref Range Status   Specimen Description BLOOD SITE NOT SPECIFIED  Final   Special Requests   Final    BOTTLES DRAWN AEROBIC AND ANAEROBIC Blood Culture adequate volume   Culture   Final    NO GROWTH 5 DAYS Performed at [facility], [address] [place], [place] [address]    Report Status 10/15/2020 FINAL  Final  Blood Culture ID Panel (Reflexed)     Status: Abnormal   Collection Time: 10/10/20  1:49 PM  Result Value Ref Range Status   Enterococcus faecalis NOT DETECTED NOT DETECTED Final   Enterococcus Faecium NOT DETECTED NOT DETECTED Final   Listeria monocytogenes NOT DETECTED NOT DETECTED Final   Staphylococcus species NOT DETECTED NOT DETECTED Final   Staphylococcus aureus (BCID) NOT DETECTED NOT DETECTED Final   Staphylococcus epidermidis NOT DETECTED NOT DETECTED Final   Staphylococcus lugdunensis NOT DETECTED NOT DETECTED Final   Streptococcus species DETECTED (A) NOT DETECTED Final    Comment: Not Enterococcus species, Streptococcus agalactiae, Streptococcus pyogenes, or Streptococcus pneumoniae. CRITICAL RESULT CALLED TO, READ BACK BY AND VERIFIED  WITH: PHARMD G.BARR AT 1222 ON 10/11/2020 BY T.SAAD    Streptococcus agalactiae NOT DETECTED NOT DETECTED Final   Streptococcus pneumoniae NOT DETECTED NOT DETECTED Final   Streptococcus pyogenes NOT DETECTED NOT DETECTED Final   A.calcoaceticus-baumannii NOT DETECTED NOT DETECTED Final   Bacteroides fragilis NOT  DETECTED NOT DETECTED Final   Enterobacterales NOT DETECTED NOT DETECTED Final   Enterobacter cloacae complex NOT DETECTED NOT DETECTED Final   Escherichia coli NOT DETECTED NOT DETECTED Final   Klebsiella aerogenes NOT DETECTED NOT DETECTED Final   Klebsiella oxytoca NOT DETECTED NOT DETECTED Final   Klebsiella pneumoniae NOT DETECTED NOT DETECTED Final   Proteus species NOT DETECTED NOT DETECTED Final   Salmonella species NOT DETECTED NOT DETECTED Final   Serratia marcescens NOT DETECTED NOT DETECTED Final   Haemophilus influenzae NOT DETECTED NOT DETECTED Final   Neisseria meningitidis NOT DETECTED NOT DETECTED Final   Pseudomonas aeruginosa NOT DETECTED NOT DETECTED Final   Stenotrophomonas maltophilia NOT DETECTED NOT DETECTED Final   Candida albicans NOT DETECTED NOT DETECTED Final   Candida auris NOT DETECTED NOT DETECTED Final   Candida glabrata NOT DETECTED NOT DETECTED Final   Candida krusei NOT DETECTED NOT DETECTED Final   Candida parapsilosis NOT DETECTED NOT DETECTED Final   Candida tropicalis NOT DETECTED NOT DETECTED Final   Cryptococcus neoformans/gattii NOT DETECTED NOT DETECTED Final    Comment: Performed at [facility], [address] [place], [place] [address]  Resp Panel by RT-PCR (Flu A&B, Covid) Nasopharyngeal Swab     Status: None   Collection Time: 10/10/20  2:58 PM   Specimen: Nasopharyngeal Swab; Nasopharyngeal(NP) swabs in vial transport medium  Result Value Ref Range Status   SARS Coronavirus 2 by RT PCR NEGATIVE NEGATIVE Final    Comment: (NOTE) SARS-CoV-2 target nucleic acids are NOT DETECTED.  The SARS-CoV-2 RNA is generally  detectable in upper respiratory specimens during the acute phase of infection. The lowest concentration of SARS-CoV-2 viral copies this assay can detect is 138 copies/mL. A negative result does not preclude SARS-Cov-2 infection and should not be used as the sole basis for treatment or other patient management decisions. A negative result may occur with  improper specimen collection/handling, submission of specimen other than nasopharyngeal swab, presence of viral mutation(s) within the areas targeted by this assay, and inadequate number of viral copies(<138 copies/mL). A negative result must be combined with clinical observations, patient history, and epidemiological information. The expected result is Negative.  Fact Sheet for Patients:  BloggerCourse.com  Fact Sheet for Healthcare Providers:  SeriousBroker.it  This test is no t yet approved or cleared by the [place] FDA and  has been authorized for detection and/or diagnosis of SARS-CoV-2 by FDA under an Emergency Use Authorization (EUA). This EUA will remain  in effect (meaning this test can be used) for the duration of the COVID-19 declaration under Section 564(b)(1) of the Act, 21 U.S.C.section 360bbb-3(b)(1), unless the authorization is terminated  or revoked sooner.       Influenza A by PCR NEGATIVE NEGATIVE Final   Influenza B by PCR NEGATIVE NEGATIVE Final    Comment: (NOTE) The Xpert Xpress SARS-CoV-2/FLU/RSV plus assay is intended as an aid in the diagnosis of influenza from Nasopharyngeal swab specimens and should not be used as a sole basis for treatment. Nasal washings and aspirates are unacceptable for Xpert Xpress SARS-CoV-2/FLU/RSV testing.  Fact Sheet for Patients: BloggerCourse.com  Fact Sheet for Healthcare Providers: SeriousBroker.it  This test is not yet approved or cleared by the [place] FDA  and has been authorized for detection and/or diagnosis of SARS-CoV-2 by FDA under an Emergency Use Authorization (EUA). This EUA will remain in effect (meaning this test can be used) for the duration of the COVID-19 declaration under Section 564(b)(1) of  the Act, 21 U.S.C. section 360bbb-3(b)(1), unless the authorization is terminated or revoked.  Performed at [facility], [address] [place], [place] [address]   Group A Strep by PCR     Status: None   Collection Time: 10/10/20  2:58 PM   Specimen: Nasopharyngeal Swab; Sterile Swab  Result Value Ref Range Status   Group A Strep by PCR NOT DETECTED NOT DETECTED Final    Comment: Performed at [facility], [address] [place], [place] [address]  Urine culture     Status: Abnormal   Collection Time: 10/11/20  4:54 AM   Specimen: Urine, Random  Result Value Ref Range Status   Specimen Description URINE, RANDOM  Final   Special Requests   Final    NONE Performed at [facility], [address] [place], [place] [address]    Culture MULTIPLE SPECIES PRESENT, SUGGEST RECOLLECTION (A)  Final   Report Status 10/12/2020 FINAL  Final         Radiology Studies: No results found.      Scheduled Meds: . bisacodyl  10 mg Oral Daily  . clindamycin  300 mg Oral Q8H  . dexamethasone (DECADRON) injection  10 mg Intravenous Q24H  . enoxaparin (LOVENOX) injection  40 mg Subcutaneous Q24H  . ferrous sulfate  325 mg Oral Q breakfast  . insulin aspart  0-9 Units Subcutaneous TID WC  . insulin aspart  4 Units Subcutaneous TID WC  . insulin glargine  12 Units Subcutaneous BID  . multivitamin with minerals  1 tablet Oral Daily  . pantoprazole (PROTONIX) IV  40 mg Intravenous Q12H  . polyethylene glycol  17 g Oral BID  . QUEtiapine  300 mg Oral QHS  . senna-docusate  1 tablet Oral BID  . sertraline  50 mg Oral Daily  . sucralfate  1 g Oral TID WC & HS  . traZODone  150 mg Oral QHS   Continuous Infusions:    LOS: 8 days    Time spent: 25 mins.More than 50% of that time was spent in counseling and/or coordination of care.      Burnadette PopAmrit Yanni Ruberg, MD [facility] P3/21/2022, 11:58 AM

## 2020-10-19 NOTE — Op Note (Signed)
[facility] Patient Name: Cory Palmer Procedure Date : 10/19/2020 MRN: [id number] Attending MD: Charna Elizabeth , MD Date of Birth: [birth year] CSN: [id number] Age: 60 Admit Type: Inpatient Procedure:                EGD with biopsies. Indications:              Dysphagia, Gastro-esophageal reflux disease,                            Abnormal CT of the GI tract. Providers:                Charna Elizabeth, MD, Dayton Bailiff, RN, Karie Kirks,                            RN, Leanne Lovely, Technician Referring MD:             [facility]. Medicines:                Fentanyl 100 micrograms IV, Midazolam 6 mg IV Complications:            No immediate complications. Estimated Blood Loss:     Estimated blood loss: Minimal. Procedure:                Pre-Anesthesia Assessment: - Prior to the                            procedure, a history and physical was performed,                            and patient medications and allergies were                            reviewed. The patient's tolerance of previous                            anesthesia was also reviewed. The risks and                            benefits of the procedure and the sedation options                            and risks were discussed with the patient. All                            questions were answered, and informed consent was                            obtained. Prior Anticoagulants: The patient has                            taken no previous anticoagulant or antiplatelet                            agents. ASA Grade Assessment: III - A patient with  severe systemic disease. After reviewing the risks                            and benefits, the patient was deemed in                            satisfactory condition to undergo the procedure.                            After obtaining informed consent, the endoscope was                            passed under direct vision. Throughout  the                            procedure, the patient's blood pressure, pulse, and                            oxygen saturations were monitored continuously. The                            GIF-H190 ([id number] Olympus gastroscope was                            introduced through the mouth, and advanced to the                            second part of duodenum. The EGD was accomplished                            without difficulty. The patient tolerated the                            procedure well. Scope In: Scope Out: Findings:      Patchy, white plaques were found in the upper third of the esophagus;       there was no evidence of esophageal varices.      LA Grade D (one or more mucosal breaks involving at least 75% of       esophageal circumference) esophagitis with bleeding was found 37 cm-42       cm from the incisors.      Nodular, friable mucosa was noted at the gastroesophageal       junction-biopsies were done to rule out dysplasia.      A few dispersed erosions with stigmata of recent bleeding were found in       the gastric antrum-biopsies were done to rule out H. pylori by pathology.      Diffuse moderate inflammation characterized by erosions, erythema,       friability and granularity was found in the entire examined stomach.      The cardia and gastric fundus were normal on retroflexion; there was no       evidence of gastric varices.      Two small non-bleeding cratered duodenal ulcers with no stigmata of       bleeding were found in the duodenal bulb.  The first portion of the duodenum was normal. Impression:               - Esophageal plaques were found, suspicious for                            candidiasis.                           - LA Grade D reflux esophagitis with bleeding.                           - Nodualr mucosa at the GEJ, ?esophjagitis-biopsied.                           - Gastric erosions with stigmata of recent                             bleeding-biopsied.                           - Moderate diffuse gastritis.                           - Two small non-bleeding duodenal ulcers in the                            duodenal bulb.                           - Normal first portion of the duodenum. Moderate Sedation:      Moderate (conscious) sedation was administered by the endoscopy nurse       and supervised by the endoscopist. The following parameters were       monitored: oxygen saturation, heart rate, blood pressure, respiratory       rate, EKG, adequacy of pulmonary ventilation, and response to care.       Total physician intraservice time was 16 minutes. Recommendation:           - Clear liquid diet today.                           - Continue present medications.                           - Await pathology results.                           - No Ibuprofen, Naproxen, or other non-steroidal                            anti-inflammatory drugs. Procedure Code(s):        --- Professional ---                           [phone], Esophagogastroduodenoscopy, flexible,                            transoral; with biopsy, single or multiple  G0500, Moderate sedation services provided by the                            same physician or other qualified health care                            professional performing a gastrointestinal                            endoscopic service that sedation supports,                            requiring the presence of an independent trained                            observer to assist in the monitoring of the                            patient's level of consciousness and physiological                            status; initial 15 minutes of intra-service time;                            patient age 16 years or older (additional time may                            be reported with 56256, as appropriate) Diagnosis Code(s):        --- Professional ---                            R13.10, Dysphagia, unspecified                           R93.3, Abnormal findings on diagnostic imaging of                            other parts of digestive tract                           K26.9, Duodenal ulcer, unspecified as acute or                            chronic, without hemorrhage or perforation                           K29.70, Gastritis, unspecified, without bleeding                           K21.01, Gastro-esophageal reflux disease with                            esophagitis, with bleeding                           K25.4,  Chronic or unspecified gastric ulcer with                            hemorrhage                           K22.8, Other specified diseases of esophagus                           K22.9, Disease of esophagus, unspecified CPT copyright 2019 American Medical Association. All rights reserved. The codes documented in this report are preliminary and upon coder review may  be revised to meet current compliance requirements. Charna Elizabeth, MD Charna Elizabeth, MD 10/19/2020 3:57:44 PM This report has been signed electronically. Number of Addenda: 0

## 2020-10-19 NOTE — Plan of Care (Signed)

## 2020-10-19 NOTE — H&P (View-Only) (Signed)
Inpatient Diabetes Program Recommendations  AACE/ADA: New Consensus Statement on Inpatient Glycemic Control (2015)  Target Ranges:  Prepandial:   less than 140 mg/dL      Peak postprandial:   less than 180 mg/dL (1-2 hours)      Critically ill patients:  140 - 180 mg/dL   Lab Results  Component Value Date   GLUCAP 174 (H) 10/19/2020   HGBA1C 6.6 (H) 10/10/2020    Review of Glycemic Control  Diabetes history: DM 2 Outpatient Diabetes medications: None Current orders for Inpatient glycemic control: Lantus 12 units bid + Novolog 4 units tid meal coverage + Novolog 0-9 units tid  Inpatient Diabetes Program Recommendations:   Will continue to follow Noted steroids now discontinued and may need adjustment in insulin. -Add Novolog 0-5 units correction hs  Thank you, Billy Fischer. Azlan Hanway, RN, MSN, CDE  Diabetes Coordinator Inpatient Glycemic Control Team Team Pager [phone] (8am-5pm) 10/19/2020 2:44 PM

## 2020-10-20 ENCOUNTER — Other Ambulatory Visit: Payer: Self-pay | Admitting: Internal Medicine

## 2020-10-20 LAB — BASIC METABOLIC PANEL WITH GFR
Anion gap: 8 (ref 5–15)
BUN: 19 mg/dL (ref 6–20)
CO2: 22 mmol/L (ref 22–32)
Calcium: 8.9 mg/dL (ref 8.9–10.3)
Chloride: 102 mmol/L (ref 98–111)
Creatinine, Ser: 0.74 mg/dL (ref 0.61–1.24)
GFR, Estimated: >60 mL/min
Glucose, Bld: 265 mg/dL — ABNORMAL HIGH (ref 70–99)
Potassium: 4.4 mmol/L (ref 3.5–5.1)
Sodium: 132 mmol/L — ABNORMAL LOW (ref 135–145)

## 2020-10-20 LAB — CBC WITH DIFFERENTIAL/PLATELET
Abs Immature Granulocytes: 0.17 10*3/uL — ABNORMAL HIGH (ref 0.00–0.07)
Basophils Absolute: 0 10*3/uL (ref 0.0–0.1)
Basophils Relative: 0 %
Eosinophils Absolute: 0 10*3/uL (ref 0.0–0.5)
Eosinophils Relative: 0 %
HCT: 30.2 % — ABNORMAL LOW (ref 39.0–52.0)
Hemoglobin: 10.9 g/dL — ABNORMAL LOW (ref 13.0–17.0)
Immature Granulocytes: 2 %
Lymphocytes Relative: 9 %
Lymphs Abs: 0.8 10*3/uL (ref 0.7–4.0)
MCH: 33.4 pg (ref 26.0–34.0)
MCHC: 36.1 g/dL — ABNORMAL HIGH (ref 30.0–36.0)
MCV: 92.6 fL (ref 80.0–100.0)
Monocytes Absolute: 0.4 10*3/uL (ref 0.1–1.0)
Monocytes Relative: 4 %
Neutro Abs: 7.5 10*3/uL (ref 1.7–7.7)
Neutrophils Relative %: 85 %
Platelets: 81 10*3/uL — ABNORMAL LOW (ref 150–400)
RBC: 3.26 MIL/uL — ABNORMAL LOW (ref 4.22–5.81)
RDW: 13.4 % (ref 11.5–15.5)
WBC: 8.8 10*3/uL (ref 4.0–10.5)
nRBC: 0 % (ref 0.0–0.2)

## 2020-10-20 LAB — GLUCOSE, CAPILLARY
Glucose-Capillary: 223 mg/dL — ABNORMAL HIGH (ref 70–99)
Glucose-Capillary: 196 mg/dL — ABNORMAL HIGH (ref 70–99)
Glucose-Capillary: 316 mg/dL — ABNORMAL HIGH (ref 70–99)
Glucose-Capillary: 282 mg/dL — ABNORMAL HIGH (ref 70–99)

## 2020-10-20 LAB — SURGICAL PATHOLOGY

## 2020-10-20 MED ORDER — SUCRALFATE 1 GM/10ML PO SUSP: 1.0000 g | Freq: Three times a day (TID) | ORAL | 0 refills | Status: DC

## 2020-10-20 MED ORDER — FLUCONAZOLE 100 MG PO TABS: 100.0000 mg | ORAL_TABLET | Freq: Every day | ORAL | 0 refills | Status: AC

## 2020-10-20 MED ORDER — CLINDAMYCIN HCL 300 MG PO CAPS: 300.0000 mg | ORAL_CAPSULE | Freq: Three times a day (TID) | ORAL | 0 refills | Status: AC

## 2020-10-20 MED ORDER — FLUCONAZOLE 100 MG PO TABS
200.0000 mg | ORAL_TABLET | Freq: Once | ORAL | Status: AC
  Administered 2020-10-20: 200 mg via ORAL
  Filled 2020-10-20: qty 2

## 2020-10-20 MED ORDER — QUETIAPINE FUMARATE 300 MG PO TABS: 300.0000 mg | ORAL_TABLET | Freq: Every day | ORAL | 0 refills | Status: DC

## 2020-10-20 MED ORDER — POLYETHYLENE GLYCOL 3350 17 G PO PACK: 17.0000 g | PACK | Freq: Every day | ORAL | 0 refills | Status: DC | PRN

## 2020-10-20 MED ORDER — FLUCONAZOLE 100 MG PO TABS: 100.0000 mg | ORAL_TABLET | Freq: Every day | ORAL | Status: DC

## 2020-10-20 MED ORDER — TRAZODONE HCL 150 MG PO TABS: 150.0000 mg | ORAL_TABLET | Freq: Every day | ORAL | 0 refills | Status: DC

## 2020-10-20 MED ORDER — INSULIN GLARGINE 100 UNIT/ML ~~LOC~~ SOLN
14.0000 [IU] | Freq: Two times a day (BID) | SUBCUTANEOUS | Status: DC
  Administered 2020-10-20: 14 [IU] via SUBCUTANEOUS
  Filled 2020-10-20 (×2): qty 0.14

## 2020-10-20 MED ORDER — SERTRALINE HCL 50 MG PO TABS: 50.0000 mg | ORAL_TABLET | Freq: Every day | ORAL | 0 refills | Status: DC

## 2020-10-20 MED ORDER — PANTOPRAZOLE SODIUM 40 MG PO TBEC: 40.0000 mg | DELAYED_RELEASE_TABLET | Freq: Two times a day (BID) | ORAL | 1 refills | Status: DC

## 2020-10-20 MED FILL — SUCRALFATE 1 GM/10ML SUSP: 1 | 14 days supply | Qty: 560 | Fill #0

## 2020-10-20 MED FILL — FLUCONAZOLE 100 MG TABLET: 100 | 6 days supply | Qty: 6 | Fill #0

## 2020-10-20 MED FILL — QUEtiapine FUMARATE 300 MG: 300 | 30 days supply | Qty: 30 | Fill #0

## 2020-10-20 MED FILL — CLINDAMYCIN HCL 300 MG CAPS: 300 | 4 days supply | Qty: 12 | Fill #0

## 2020-10-20 MED FILL — SERTRALINE HCL 50 MG TABLET: 50 | 30 days supply | Qty: 30 | Fill #0

## 2020-10-20 MED FILL — PANTOPRAZOLE SOD DR 40 MG T: 40 | 30 days supply | Qty: 60 | Fill #0

## 2020-10-20 MED FILL — traZODone HCL 150 MG TABS: 150 | 30 days supply | Qty: 30 | Fill #0

## 2020-10-20 NOTE — TOC Initial Note (Addendum)
Transition of Care [facility]) - Initial/Assessment Note    Patient Details  Name: Cory Palmer MRN: [id number] Date of Birth: May 06, 1961  Transition [facility]) CM/SW Contact:    Kingsley Plan, RN Phone Number: 10/20/2020, 3:32 PM  Clinical Narrative:                 Patient from home alone. Confirmed face sheet information.  PCP is Dr Nadyne Coombes [phone] [facility] [address] Sherian Maroon [phone]  Patient has a manuel wheel chair  at home. He is getting an electric wheel chair this week. NCM offered to make an appointment with Dr Hal Hope , however patient declined stating once he gets home and looks at his schedule he will make an appointment.  Patient requesting ambulance transport home. Confirmed address. PTAR paperwork on chart . Secure chatted nurse to see what time she wants PTAR called for.PTAR requested for 5 pm   Expected Discharge Plan: Home/Self Care Barriers to Discharge: No Barriers Identified   Patient Goals and CMS Choice Patient states their goals for this hospitalization and ongoing recovery are:: to return to home CMS Medicare.gov Compare Post Acute Care list provided to:: Patient Choice offered to / list presented to : NA  Expected Discharge Plan and Services Expected Discharge Plan: Home/Self Care   Discharge Planning Services: CM Consult Post Acute Care Choice: Durable Medical Equipment Living arrangements for the past 2 months: Apartment Expected Discharge Date: 10/20/20               DME Arranged: N/A DME Agency: NA       HH Arranged: NA          Prior Living Arrangements/Services Living arrangements for the past 2 months: Apartment Lives with:: Self Patient language and need for interpreter reviewed:: Yes Do you feel safe going back to the place where you live?: Yes      Need for Family Participation in Patient Care: No (Comment) Care giver support system in place?: Yes (comment) Current home services: DME Criminal  Activity/Legal Involvement Pertinent to Current Situation/Hospitalization: No - Comment as needed  Activities of Daily Living Home Assistive Devices/Equipment: Wheelchair,Shower chair with back ADL Screening (condition at time of admission) Patient's cognitive ability adequate to safely complete daily activities?: Yes Is the patient deaf or have difficulty hearing?: No Does the patient have difficulty seeing, even when wearing glasses/contacts?: No Does the patient have difficulty concentrating, remembering, or making decisions?: No Patient able to express need for assistance with ADLs?: Yes Does the patient have difficulty dressing or bathing?: No Independently performs ADLs?: Yes (appropriate for developmental age) Does the patient have difficulty walking or climbing stairs?: Yes Weakness of Legs: None Weakness of Arms/Hands: None  Permission Sought/Granted   Permission granted to share information with : No              Emotional Assessment Appearance:: Appears stated age Attitude/Demeanor/Rapport: Engaged Affect (typically observed): Accepting Orientation: : Oriented to Self,Oriented to Place,Oriented to  Time,Oriented to Situation Alcohol / Substance Use: Not Applicable Psych Involvement: No (comment)  Admission diagnosis:  Leukocytosis [D72.829] Lip swelling [R22.0] SIRS (systemic inflammatory response syndrome) (HCC) [R65.10] Patient Active Problem List   Diagnosis Date Noted  . Leukocytosis 10/10/2020  . GERD (gastroesophageal reflux disease) 10/10/2020  . 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HCC) 05/06/2020  . Hyponatremia 10/23/2019  . MRSA bacteremia 10/21/2019  . Atrial flutter with rapid ventricular response (HCC) 10/21/2019  . Lactic acidosis  10/21/2019  . Severe protein-calorie malnutrition (HCC)   . Diabetic polyneuropathy associated with type 2 diabetes mellitus (HCC)   . Cirrhosis of liver (HCC) 10/19/2019  . Anemia 10/19/2019  . Medication  monitoring encounter 08/15/2019  . Serum total bilirubin elevated 10/29/2018  . BPH (benign prostatic hyperplasia) 10/29/2018  . Below-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)   . PAF (paroxysmal atrial fibrillation) (HCC)   . Hypokalemia   . Hypomagnesemia   . Sepsis without acute organ dysfunction (HCC) 10/05/2018  . Depressed bipolar disorder (HCC) 10/05/2018  . Hypertension 10/05/2018  . Hyperbilirubinemia 10/05/2018  . Homelessness 05/07/2018  . Diabetes mellitus type II, non insulin dependent (HCC) 05/07/2018   PCP:  Dois Davenport, MD Pharmacy:   [facility] - [place], [place] - [address] [place] [address] Phone: [phone] Fax: [phone]  [id number] [place], [place] - [address] AT [facility] [address] [place] [address] Phone: [phone] Fax: [phone]  [facility] [phone] - [place], [place] - [phone] AT [facility] [address] & [address] [place] [address] Phone: [phone] Fax: [phone]     Social Determinants of Health (SDOH) Interventions    Readmission Risk Interventions Readmission Risk Prevention Plan 10/21/2019  Transportation Screening Complete  PCP or Specialist Appt within 5-7 Days Complete  Home Care Screening Complete  Medication Review (RN CM) Complete  Some recent data might be hidden

## 2020-10-20 NOTE — Progress Notes (Signed)
Inpatient Diabetes Program Recommendations  AACE/ADA: New Consensus Statement on Inpatient Glycemic Control (2015)  Target Ranges:  Prepandial:   less than 140 mg/dL      Peak postprandial:   less than 180 mg/dL (1-2 hours)      Critically ill patients:  140 - 180 mg/dL   Lab Results  Component Value Date   GLUCAP 223 (H) 10/20/2020   HGBA1C 6.6 (H) 10/10/2020    Review of Glycemic Control Results for Cory Palmer, Cory Palmer (MRN [id number]) as of 10/20/2020 10:08  Ref. Range 10/19/2020 07:25 10/19/2020 13:53 10/19/2020 16:21 10/19/2020 21:06 10/19/2020 21:20 10/20/2020 07:41  Glucose-Capillary Latest Ref Range: 70 - 99 mg/dL 165 (H) 790 (H) 383 (H) 277 (H) 323 (H) 223 (H)   Diabetes history: DM 2 Outpatient Diabetes medications: None Current orders for Inpatient glycemic control: Lantus 12 units bid + Novolog 4 units tid meal coverage + Novolog 0-9 units tid  Inpatient Diabetes Program Recommendations:   -  Increase Lantus to 14 units bid -  Add Novolog 0-5 units correction hs  - may also consider Novolog 3 units tid meal coverage if eating >50% of meals and glucose trends increase at meal times.  Thank you, Christena Deem RN, MSN, BC-ADM Inpatient Diabetes Coordinator Team Pager [phone] (8a-5p)

## 2020-10-20 NOTE — Discharge Summary (Signed)
Physician Discharge Summary  KYLIAN LOH Palmer:[id number] DOB: 01/01/1961 DOA: 10/10/2020  PCP: Patient, [name] Per  Admit date: 10/10/2020 Discharge date: 10/20/2020  Admitted From: Home Disposition:  Home  Discharge Condition:Stable CODE STATUS:FULL Diet recommendation:Carb Modified  Brief/Interim Summary: Patient is a 60 year old male with history of bilateral BKA, anxiety disorder, bipolar disorder, type 2 diabetes, hypertension who presented to the emergency room with complaints of lower lip edema and RN aphasia.  Patient lives alone, is able to do his activities of daily living independently.  Patient was complaining of excruciating burning pain in the throat radiating towards the stomach .  He was unable to take his home medication because of pain.  He was also found to have severe lower lip swelling.  CT soft tissue neck on admission did not show any obstruction.  GI was consulted for possible EGD but it was canceled due to significant lower lip swelling.  ENT consulted for that.  Lower lip swelling has been improving with the steroids and antibiotics.  He also underwent EGD with finding of esophagitis.  He is medically stable for discharge home today.  Following problems were addressed during his hospitalization:  Severe odynophagia: Suspected to be from severe esophagitis.  Reported burning pain in the throat radiating towards the stomach after drinking orange juice.   GI was initially consulted and plan for EGD but was postponed due to lower lip swelling.    Underwent EGD on 10/19/2020 with finding of esophageal plaques with possible candidiasis, LA grade D reflux esophagitis with bleeding, gastric erosions, moderate diffuse gastritis.  We will continue Protonix and Carafate on discharge.  Also started on short course of fluconazole.  Lower lip swelling: Significantly improved now.  Denies any trauma, follow allergy.  Report of lower teeth being pulled out about 3 weeks ago.  C1 esterase  inhibitor level normal.  Due to persistent  lip swelling, ENT consulted who recommended clindamycin ,he was also on Decadron.  We d/ced steroid.  Plan for 7 days course of clindamycin.  Leukocytosis/lactic//AKI: Resolved with IV fluids, antibiotics  Hypokalemia/hypomagnesemia: Monitored and supplemented  Thrombocytopenia:   CT abdomen showed grossly cirrhotic morphology of liver with stigmata of portal hypertension including splenomegaly and gastric varices.  No sign of bleeding.  Vitamin B-12, folate normal.  Monitor as an outpatient  Diabetes type 2/hyperglycemia: Hemoglobin C of 6.6.  Blood sugars have been uncontrolled most likely due to steroids now stopped. He was taking  Metformin at home,we wud recommend to continue taking it.    Bilateral BKA: Ambulates with help of wheelchair.  Lives independently  Constipation: Continue bowel regimen   Discharge Diagnoses:  Principal Problem:   Leukocytosis Active Problems:   Diabetes mellitus type II, non insulin dependent (HCC)   Depressed bipolar disorder ([place])   Hypertension   Below-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)   Cirrhosis of liver (HCC)   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HCC)   GERD (gastroesophageal reflux disease)    Discharge Instructions  Discharge Instructions    Diet Carb Modified   Complete by: As directed    Discharge instructions   Complete by: As directed    1)Please follow-up with your PCP in a week 2)Take pressure medications as instructed 3)Follow up with ENT in 2 weeks.  Name and number of the provider has been attached.   Increase activity slowly   Complete by: As directed      Allergies as of 10/20/2020   No Known Allergies  Medication List    TAKE these medications   Accu-Chek Aviva Plus w/Device Kit   clindamycin 300 MG capsule Commonly known as: CLEOCIN Take 1 capsule (300 mg total) by mouth every 8 (eight) hours for 4 days.   ferrous sulfate 325 (65 FE)  MG tablet Take 1 tablet (325 mg total) by mouth daily with breakfast.   fluconazole 100 MG tablet Commonly known as: DIFLUCAN Take 1 tablet (100 mg total) by mouth daily for 6 days. Start taking on: October 21, 2020   metFORMIN 1000 MG tablet Commonly known as: GLUCOPHAGE Take 1 tablet (1,000 mg total) by mouth daily.   multivitamin with minerals Tabs tablet Take 1 tablet by mouth daily.   pantoprazole 40 MG tablet Commonly known as: Protonix Take 1 tablet (40 mg total) by mouth 2 (two) times daily.   QUEtiapine 300 MG tablet Commonly known as: SEROQUEL Take 1 tablet (300 mg total) by mouth at bedtime.   sertraline 50 MG tablet Commonly known as: ZOLOFT Take 1 tablet (50 mg total) by mouth daily.   sucralfate 1 GM/10ML suspension Commonly known as: CARAFATE Take 10 mLs (1 g total) by mouth 4 (four) times daily -  with meals and at bedtime for 14 days.   traZODone 150 MG tablet Commonly known as: DESYREL Take 1 tablet (150 mg total) by mouth at bedtime.       Follow-up Information    Leta Baptist, MD. Schedule an appointment as soon as possible for a visit in 2 week(s).   Specialty: Otolaryngology Contact information: [place] [address] [phone]              No Known Allergies  Consultations:  GI,ENT   Procedures/Studies: DG Chest 2 View  Result Date: 10/10/2020 CLINICAL DATA:  Chest pain. EXAM: CHEST - 2 VIEW COMPARISON:  October 19, 2019 FINDINGS: The heart size and mediastinal contours are within normal limits. Both lungs are clear. The visualized skeletal structures are unremarkable. IMPRESSION: No active cardiopulmonary disease. Electronically Signed   By: Dorise Bullion III M.D   On: 10/10/2020 13:25   CT Soft Tissue Neck W Contrast  Result Date: 10/10/2020 CLINICAL DATA:  Neck mass. Lower lip swelling and bruising. Possible puncture wound. Nausea, vomiting, and throat pain. EXAM: CT NECK WITH CONTRAST TECHNIQUE:  Multidetector CT imaging of the neck was performed using the standard protocol following the bolus administration of intravenous contrast. CONTRAST:  183m OMNIPAQUE IOHEXOL 350 MG/ML SOLN COMPARISON:  CTs of the cervical spine and orbits 05/02/2020. FINDINGS: Pharynx and larynx: No gross mass or swelling is identified, however there is extensive motion artifact through the oral cavity and oropharynx. The airway is widely patent. There is no retropharyngeal fluid collection. Salivary glands: No inflammation, mass, or stone. Thyroid: Unremarkable. Lymph nodes: No enlarged or suspicious lymph nodes in the neck. Vascular: Moderate calcified plaque at the left greater than right carotid bifurcations. Likely significant stenosis of the proximal ICA with assessment limited by motion artifact and non angiographic technique. Limited intracranial: Unremarkable. Visualized orbits: Unremarkable. Mastoids and visualized paranasal sinuses: Clear. Skeleton: Absence of the maxillary and majority of the mandibular dentition. Moderate cervical spondylosis. Upper chest: Report separately. Other: Diffuse soft tissue swelling of the lower lip. No discrete mass or fluid collection identified within limitations of motion artifact. IMPRESSION: 1. Motion degraded examination. 2. Nonspecific lower lip soft tissue swelling. 3. Carotid atherosclerosis with likely significant proximal left ICA stenosis. Consider carotid Doppler ultrasound for further  evaluation. Electronically Signed   By: Logan Bores M.D.   On: 10/10/2020 16:07   CT Angio Chest/Abd/Pel for Dissection W and/or W/WO  Result Date: 10/10/2020 CLINICAL DATA:  Abdominal pain, aortic dissection suspected EXAM: CT ANGIOGRAPHY CHEST, ABDOMEN AND PELVIS TECHNIQUE: Non-contrast CT of the chest was initially obtained. Multidetector CT imaging through the chest, abdomen and pelvis was performed using the standard protocol during bolus administration of intravenous contrast.  Multiplanar reconstructed images and MIPs were obtained and reviewed to evaluate the vascular anatomy. CONTRAST:  152m OMNIPAQUE IOHEXOL 350 MG/ML SOLN COMPARISON:  None. FINDINGS: CTA CHEST FINDINGS Cardiovascular: Preferential opacification of the thoracic aorta. Normal contour and caliber of the thoracic aorta. No evidence of aneurysm, dissection or other aortic pathology. Incidental note of bovine type 2 vessel branching pattern aortic arch. Normal heart size. No pericardial effusion. Three-vessel coronary artery calcifications. Mediastinum/Nodes: No enlarged mediastinal, hilar, or axillary lymph nodes. There is diffuse circumferential thickening of the lower half of the esophagus (series 5, image 83, series 4, image 114). Thyroid gland and trachea demonstrate no significant findings. Lungs/Pleura: Lungs are clear. No pleural effusion or pneumothorax. Musculoskeletal: No chest wall abnormality. No acute or significant osseous findings. Review of the MIP images confirms the above findings. CTA ABDOMEN AND PELVIS FINDINGS VASCULAR Normal contour and caliber of the abdominal aorta. No evidence of aneurysm, dissection or other acute aortic pathology. 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 Solitary left renal artery. Otherwise standard branching pattern of the abdominal aorta. The branch vessel origins are patent. Mild aortic atherosclerosis. Review of the MIP images confirms the above findings. NON-VASCULAR Hepatobiliary: Grossly cirrhotic morphology of the liver. No solid liver abnormality is seen. No gallstones, gallbladder wall thickening, or biliary dilatation. Pancreas: Unremarkable. No pancreatic ductal dilatation or surrounding inflammatory changes. Spleen: Splenomegaly, maximum coronal span 18.6 cm. Adrenals/Urinary Tract: Adrenal glands are unremarkable. Kidneys are normal, without renal calculi, solid lesion, or hydronephrosis. Bladder is unremarkable. Stomach/Bowel: Gastroesophageal varices.  Appendix appears normal. No evidence of bowel wall thickening, distention, or inflammatory changes. Lymphatic: No enlarged abdominal or pelvic lymph nodes. Reproductive: No mass or other significant abnormality. Other: No abdominal wall hernia or abnormality. No abdominopelvic ascites. Musculoskeletal: Chronic bilateral pars defects and degenerative listhesis of L4 on L5. Review of the MIP images confirms the above findings. IMPRESSION: 1. Normal contour and caliber of the thoracic and abdominal aorta. No evidence of aneurysm, dissection or other acute aortic pathology. Mild aortic atherosclerosis. 2. There is diffuse circumferential thickening of the lower half of the esophagus, which may be related to reflux esophagitis. Malignancy is however difficult to exclude. 3. Grossly cirrhotic morphology of the liver with stigmata of portal hypertension including splenomegaly and gastroesophageal varices. 4. Coronary artery disease. Aortic Atherosclerosis (ICD10-I70.0). Electronically Signed   By: AEddie CandleM.D.   On: 10/10/2020 16:08       Subjective: Patient seen and examined the bedside this morning.  Hemodynamically stable for discharge today.  Discharge Exam: Vitals:   10/20/20 0534 10/20/20 1400  BP: 133/70 134/68  Pulse: (!) 55 (!) 54  Resp: 17 18  Temp: 98 F (36.7 C) 98 F (36.7 C)  SpO2: 98% 99%   Vitals:   10/19/20 1625 10/19/20 2026 10/20/20 0534 10/20/20 1400  BP: 120/69 (!) 143/68 133/70 134/68  Pulse: (!) 51 (!) 51 (!) 55 (!) 54  Resp: _0 Temp: 97.8 F (36.6 C) 97.9 F (36.6 C) 98 F (36.7 C) 98 F (36.7 C)  TempSrc: Oral   Oral  SpO2: 95% 97% 98% 99%  Weight:      Height:        General: Pt is alert, awake, not in acute distress Cardiovascular: RRR, S1/S2 +, no rubs, no gallops Respiratory: CTA bilaterally, no wheezing, no rhonchi Abdominal: Soft, NT, ND, bowel sounds + Extremities: no edema, no cyanosis    The results of significant diagnostics from  this hospitalization (including imaging, microbiology, ancillary and laboratory) are listed below for reference.     Microbiology: Recent Results (from the past 240 hour(s))  Resp Panel by RT-PCR (Flu A&B, Covid) Nasopharyngeal Swab     Status: None   Collection Time: 10/10/20  2:58 PM   Specimen: Nasopharyngeal Swab; Nasopharyngeal(NP) swabs in vial transport medium  Result Value Ref Range Status   SARS Coronavirus 2 by RT PCR NEGATIVE NEGATIVE Final    Comment: (NOTE) SARS-CoV-2 target nucleic acids are NOT DETECTED.  The SARS-CoV-2 RNA is generally detectable in upper respiratory specimens during the acute phase of infection. The lowest concentration of SARS-CoV-2 viral copies this assay can detect is 138 copies/mL. A negative result does not preclude SARS-Cov-2 infection and should not be used as the sole basis for treatment or other patient management decisions. A negative result may occur with  improper specimen collection/handling, submission of specimen other than nasopharyngeal swab, presence of viral mutation(s) within the areas targeted by this assay, and inadequate number of viral copies(<138 copies/mL). A negative result must be combined with clinical observations, patient history, and epidemiological information. The expected result is Negative.  Fact Sheet for Patients:  EntrepreneurPulse.com.au  Fact Sheet for Healthcare Providers:  IncredibleEmployment.be  This test is no t yet approved or cleared by the [place] FDA and  has been authorized for detection and/or diagnosis of SARS-CoV-2 by FDA under an Emergency Use Authorization (EUA). This EUA will remain  in effect (meaning this test can be used) for the duration of the COVID-19 declaration under Section 564(b)(1) of the Act, 21 U.S.C.section 360bbb-3(b)(1), unless the authorization is terminated  or revoked sooner.       Influenza A by PCR NEGATIVE NEGATIVE Final    Influenza B by PCR NEGATIVE NEGATIVE Final    Comment: (NOTE) The Xpert Xpress SARS-CoV-2/FLU/RSV plus assay is intended as an aid in the diagnosis of influenza from Nasopharyngeal swab specimens and should not be used as a sole basis for treatment. Nasal washings and aspirates are unacceptable for Xpert Xpress SARS-CoV-2/FLU/RSV testing.  Fact Sheet for Patients: EntrepreneurPulse.com.au  Fact Sheet for Healthcare Providers: IncredibleEmployment.be  This test is not yet approved or cleared by the [place] FDA and has been authorized for detection and/or diagnosis of SARS-CoV-2 by FDA under an Emergency Use Authorization (EUA). This EUA will remain in effect (meaning this test can be used) for the duration of the COVID-19 declaration under Section 564(b)(1) of the Act, 21 U.S.C. section 360bbb-3(b)(1), unless the authorization is terminated or revoked.  Performed at [facility], [place] [address], [place], [place] [address]   Group A Strep by PCR     Status: None   Collection Time: 10/10/20  2:58 PM   Specimen: Nasopharyngeal Swab; Sterile Swab  Result Value Ref Range Status   Group A Strep by PCR NOT DETECTED NOT DETECTED Final    Comment: Performed at [facility], [place] [address] [place], [place] [address]  Urine culture     Status: Abnormal   Collection Time: 10/11/20  4:54 AM   Specimen: Urine, Random  Result Value Ref Range Status   Specimen Description URINE, RANDOM  Final   Special Requests   Final    NONE Performed at [facility], [address] [place], [place] [address]    Culture MULTIPLE SPECIES PRESENT, SUGGEST RECOLLECTION (A)  Final   Report Status 10/12/2020 FINAL  Final     Labs: BNP (last 3 results) No results for input(s): BNP in the last 8760 hours. Basic Metabolic Panel: Recent Labs  Lab 10/15/20 0125 10/16/20 0132 10/17/20 0026 10/18/20 0105 10/19/20 0140 10/20/20 0310  NA 139 132*  133* 130* 129* 132*  K 3.3* 3.9 4.0 4.2 4.3 4.4  CL 108 105 106 102 102 102  CO2 20* 19* 19* 21* 20* 22  GLUCOSE 103* 257* 329* 332* 372* 265*  BUN _0 22* 19  CREATININE 0.72 0.70 0.85 0.78 0.76 0.74  CALCIUM 8.5* 8.7* 8.7* 8.5* 8.5* 8.9  MG 1.5* 1.8 1.8  --  2.1  --    Liver Function Tests: Recent Labs  Lab 10/15/20 0125  AST 21  ALT 15  ALKPHOS 89  BILITOT 1.2  PROT 5.7*  ALBUMIN 2.7*   No results for input(s): LIPASE, AMYLASE in the last 168 hours. No results for input(s): AMMONIA in the last 168 hours. CBC: Recent Labs  Lab 10/14/20 0238 10/15/20 0125 10/17/20 0026 10/19/20 0140 10/20/20 0310  WBC 7.9 6.6 9.5 8.9 8.8  NEUTROABS  --  4.3  --  7.3 7.5  HGB 10.5* 10.7* 10.0* 10.4* 10.9*  HCT 28.9* 30.4* 27.8* 29.1* 30.2*  MCV 93.8 93.8 93.6 94.2 92.6  PLT 83* 89* 90* 86* 81*   Cardiac Enzymes: No results for input(s): CKTOTAL, CKMB, CKMBINDEX, TROPONINI in the last 168 hours. BNP: Invalid input(s): POCBNP CBG: Recent Labs  Lab 10/19/20 1621 10/19/20 2106 10/19/20 2120 10/20/20 0741 10/20/20 1139  GLUCAP 196* 277* 323* 223* 196*   D-Dimer No results for input(s): DDIMER in the last 72 hours. Hgb A1c No results for input(s): HGBA1C in the last 72 hours. Lipid Profile No results for input(s): CHOL, HDL, LDLCALC, TRIG, CHOLHDL, LDLDIRECT in the last 72 hours. Thyroid function studies No results for input(s): TSH, T4TOTAL, T3FREE, THYROIDAB in the last 72 hours.  Invalid input(s): FREET3 Anemia work up No results for input(s): VITAMINB12, FOLATE, FERRITIN, TIBC, IRON, RETICCTPCT in the last 72 hours. Urinalysis    Component Value Date/Time   COLORURINE YELLOW 10/10/2020 0426   APPEARANCEUR CLEAR 10/10/2020 0426   LABSPEC 1.024 10/10/2020 0426   PHURINE 5.0 10/10/2020 0426   GLUCOSEU NEGATIVE 10/10/2020 0426   HGBUR NEGATIVE 10/10/2020 0426   BILIRUBINUR NEGATIVE 10/10/2020 0426   KETONESUR NEGATIVE 10/10/2020 0426   PROTEINUR NEGATIVE  10/10/2020 0426   NITRITE NEGATIVE 10/10/2020 0426   LEUKOCYTESUR NEGATIVE 10/10/2020 0426   Sepsis Labs Invalid input(s): PROCALCITONIN,  WBC,  LACTICIDVEN Microbiology Recent Results (from the past 240 hour(s))  Resp Panel by RT-PCR (Flu A&B, Covid) Nasopharyngeal Swab     Status: None   Collection Time: 10/10/20  2:58 PM   Specimen: Nasopharyngeal Swab; Nasopharyngeal(NP) swabs in vial transport medium  Result Value Ref Range Status   SARS Coronavirus 2 by RT PCR NEGATIVE NEGATIVE Final    Comment: (NOTE) SARS-CoV-2 target nucleic acids are NOT DETECTED.  The SARS-CoV-2 RNA is generally detectable in upper respiratory specimens during the acute phase of infection. The lowest concentration of SARS-CoV-2 viral copies this assay can  detect is 138 copies/mL. A negative result does not preclude SARS-Cov-2 infection and should not be used as the sole basis for treatment or other patient management decisions. A negative result may occur with  improper specimen collection/handling, submission of specimen other than nasopharyngeal swab, presence of viral mutation(s) within the areas targeted by this assay, and inadequate number of viral copies(<138 copies/mL). A negative result must be combined with clinical observations, patient history, and epidemiological information. The expected result is Negative.  Fact Sheet for Patients:  EntrepreneurPulse.com.au  Fact Sheet for Healthcare Providers:  IncredibleEmployment.be  This test is no t yet approved or cleared by the [place] FDA and  has been authorized for detection and/or diagnosis of SARS-CoV-2 by FDA under an Emergency Use Authorization (EUA). This EUA will remain  in effect (meaning this test can be used) for the duration of the COVID-19 declaration under Section 564(b)(1) of the Act, 21 U.S.C.section 360bbb-3(b)(1), unless the authorization is terminated  or revoked sooner.        Influenza A by PCR NEGATIVE NEGATIVE Final   Influenza B by PCR NEGATIVE NEGATIVE Final    Comment: (NOTE) The Xpert Xpress SARS-CoV-2/FLU/RSV plus assay is intended as an aid in the diagnosis of influenza from Nasopharyngeal swab specimens and should not be used as a sole basis for treatment. Nasal washings and aspirates are unacceptable for Xpert Xpress SARS-CoV-2/FLU/RSV testing.  Fact Sheet for Patients: EntrepreneurPulse.com.au  Fact Sheet for Healthcare Providers: IncredibleEmployment.be  This test is not yet approved or cleared by the [place] FDA and has been authorized for detection and/or diagnosis of SARS-CoV-2 by FDA under an Emergency Use Authorization (EUA). This EUA will remain in effect (meaning this test can be used) for the duration of the COVID-19 declaration under Section 564(b)(1) of the Act, 21 U.S.C. section 360bbb-3(b)(1), unless the authorization is terminated or revoked.  Performed at [facility], [place] [address], [place], [place] [address]   Group A Strep by PCR     Status: None   Collection Time: 10/10/20  2:58 PM   Specimen: Nasopharyngeal Swab; Sterile Swab  Result Value Ref Range Status   Group A Strep by PCR NOT DETECTED NOT DETECTED Final    Comment: Performed at [facility], [place] [address], [place], [place] [address]  Urine culture     Status: Abnormal   Collection Time: 10/11/20  4:54 AM   Specimen: Urine, Random  Result Value Ref Range Status   Specimen Description URINE, RANDOM  Final   Special Requests   Final    NONE Performed at [facility], [place] [address], [place], [place] [address]    Culture MULTIPLE SPECIES PRESENT, SUGGEST RECOLLECTION (A)  Final   Report Status 10/12/2020 FINAL  Final    Please note: You were cared for by a hospitalist during your hospital stay. Once you are discharged, your primary care physician will handle any further medical issues. Please note  that NO REFILLS for any discharge medications will be authorized once you are discharged, as it is imperative that you return to your primary care physician (or establish a relationship with a primary care physician if you do not have one) for your post hospital discharge needs so that they can reassess your need for medications and monitor your lab values.    Time coordinating discharge: 40 minutes  SIGNED:   Shelly Coss, MD  [facility] 10/20/2020, 3:17 PM Pager [phone]  If 7PM-7AM, please contact night-coverage  [email or web address] Password [email or web address]

## 2020-10-20 NOTE — Progress Notes (Signed)
Patient received fluconazole per MD request/order. Discharge paperwork and education provided, IV discontinued. Heather, Case Manager scheduled PTAR.

## 2020-10-21 ENCOUNTER — Encounter (HOSPITAL_COMMUNITY): Payer: Self-pay | Admitting: Gastroenterology

## 2020-12-17 ENCOUNTER — Telehealth: Payer: Self-pay | Admitting: Physician Assistant

## 2020-12-17 NOTE — Telephone Encounter (Signed)
09/16/20 ov note faxed to [facility] [phone]

## 2021-01-28 ENCOUNTER — Emergency Department (HOSPITAL_COMMUNITY): Payer: Medicare Other

## 2021-01-28 ENCOUNTER — Encounter (HOSPITAL_COMMUNITY): Payer: Self-pay

## 2021-01-28 ENCOUNTER — Other Ambulatory Visit: Payer: Self-pay

## 2021-01-28 ENCOUNTER — Emergency Department (HOSPITAL_COMMUNITY)
Admission: EM | Admit: 2021-01-28 | Discharge: 2021-01-28 | Disposition: A | Discharge status: Home / Self Care | Payer: Medicare Other | Attending: Emergency Medicine | Admitting: Emergency Medicine

## 2021-01-28 DIAGNOSIS — E1142 Type 2 diabetes mellitus with diabetic polyneuropathy: Secondary | ICD-10-CM | POA: Insufficient documentation

## 2021-01-28 DIAGNOSIS — I10 Essential (primary) hypertension: Secondary | ICD-10-CM | POA: Insufficient documentation

## 2021-01-28 DIAGNOSIS — F17210 Nicotine dependence, cigarettes, uncomplicated: Secondary | ICD-10-CM | POA: Insufficient documentation

## 2021-01-28 DIAGNOSIS — I4891 Unspecified atrial fibrillation: Secondary | ICD-10-CM | POA: Diagnosis not present

## 2021-01-28 DIAGNOSIS — Z760 Encounter for issue of repeat prescription: Secondary | ICD-10-CM | POA: Diagnosis not present

## 2021-01-28 DIAGNOSIS — Z79899 Other long term (current) drug therapy: Secondary | ICD-10-CM | POA: Diagnosis not present

## 2021-01-28 DIAGNOSIS — R1013 Epigastric pain: Secondary | ICD-10-CM | POA: Insufficient documentation

## 2021-01-28 DIAGNOSIS — R079 Chest pain, unspecified: Secondary | ICD-10-CM

## 2021-01-28 DIAGNOSIS — R112 Nausea with vomiting, unspecified: Secondary | ICD-10-CM | POA: Insufficient documentation

## 2021-01-28 DIAGNOSIS — R072 Precordial pain: Secondary | ICD-10-CM | POA: Diagnosis present

## 2021-01-28 LAB — CBC
HCT: 47.8 % (ref 39.0–52.0)
Hemoglobin: 16.9 g/dL (ref 13.0–17.0)
MCH: 32.3 pg (ref 26.0–34.0)
MCHC: 35.4 g/dL (ref 30.0–36.0)
MCV: 91.4 fL (ref 80.0–100.0)
Platelets: 194 10*3/uL (ref 150–400)
RBC: 5.23 MIL/uL (ref 4.22–5.81)
RDW: 13.8 % (ref 11.5–15.5)
WBC: 16.8 10*3/uL — ABNORMAL HIGH (ref 4.0–10.5)
nRBC: 0 % (ref 0.0–0.2)

## 2021-01-28 LAB — BASIC METABOLIC PANEL
Anion gap: 16 — ABNORMAL HIGH (ref 5–15)
BUN: 21 mg/dL — ABNORMAL HIGH (ref 6–20)
CO2: 19 mmol/L — ABNORMAL LOW (ref 22–32)
Calcium: 9.6 mg/dL (ref 8.9–10.3)
Chloride: 107 mmol/L (ref 98–111)
Creatinine, Ser: 0.98 mg/dL (ref 0.61–1.24)
GFR, Estimated: >60 mL/min (ref >60)
Glucose, Bld: 183 mg/dL — ABNORMAL HIGH (ref 70–99)
Potassium: 3.5 mmol/L (ref 3.5–5.1)
Sodium: 142 mmol/L (ref 135–145)

## 2021-01-28 LAB — TROPONIN I (HIGH SENSITIVITY)
Troponin I (High Sensitivity): 57 ng/L — ABNORMAL HIGH (ref <18)
Troponin I (High Sensitivity): 71 ng/L — ABNORMAL HIGH (ref <18)

## 2021-01-28 MED ORDER — PANTOPRAZOLE SODIUM 40 MG PO TBEC: 40.0000 mg | DELAYED_RELEASE_TABLET | Freq: Two times a day (BID) | ORAL | 0 refills | Status: DC

## 2021-01-28 MED ORDER — PANTOPRAZOLE SODIUM 40 MG IV SOLR
40.0000 mg | Freq: Once | INTRAVENOUS | Status: AC
  Administered 2021-01-28: 40 mg via INTRAVENOUS
  Filled 2021-01-28: qty 40

## 2021-01-28 MED ORDER — TRAZODONE HCL 150 MG PO TABS: 150.0000 mg | ORAL_TABLET | Freq: Every day | ORAL | 0 refills | Status: DC

## 2021-01-28 MED ORDER — METFORMIN HCL 1000 MG PO TABS: 1000.0000 mg | ORAL_TABLET | Freq: Every day | ORAL | 0 refills | Status: DC

## 2021-01-28 MED ORDER — SERTRALINE HCL 50 MG PO TABS: 50.0000 mg | ORAL_TABLET | Freq: Every day | ORAL | 0 refills | Status: DC

## 2021-01-28 MED ORDER — QUETIAPINE FUMARATE 300 MG PO TABS: 300.0000 mg | ORAL_TABLET | Freq: Every day | ORAL | 0 refills | Status: DC

## 2021-01-28 MED ORDER — SUCRALFATE 1 G PO TABS
1.0000 g | ORAL_TABLET | Freq: Once | ORAL | Status: AC
  Administered 2021-01-28: 1 g via ORAL
  Filled 2021-01-28: qty 1

## 2021-01-28 NOTE — ED Notes (Signed)
Pt states he is ready to go home and he can pick up his medication from [facility]. Dr. Charm Barges notified. Refreshments provided to pt

## 2021-01-28 NOTE — ED Triage Notes (Addendum)
Per EMS- Patient c/o epigastric pain and chest pain. Pain is worse with movement. Patient states he gets his meds via [employer] and they deliver his meds and leave them on his doorstep and he states that they are being stolen. Patient states he has not had his meds in a month.  Patient states he needs a SW consult and added that he has been vomiting today.

## 2021-01-28 NOTE — ED Provider Notes (Signed)
[facility] DEPT Provider Note   CSN: [id number] Arrival date & time: 01/28/21  1611     History Chief Complaint  Patient presents with   Chest Pain   Abdominal Pain   Emesis    Cory Palmer is a 60 y.o. male.  He has a history of anxiety depression diabetes hypertension, bilateral amputee.  He said he has been off his meds for few months because he says his meds are delivered by [employer] and people of been stealing his packages.  He is also had difficulty with services at home.  He said he supposed to be getting fitted with a prosthetic leg soon.  He has had some chest discomfort and upper abdominal discomfort.  He cannot seem to tell me anything about that.  He denies any history of atrial fibrillation but this has been noted on prior admissions and he is not on anticoagulation due to chronic anemia and poor adherence.  The history is provided by the patient.  Chest Pain Pain location:  Substernal area Pain quality: aching   Pain radiates to:  Epigastrium Pain severity:  Unable to specify Onset quality:  Unable to specify Timing:  Intermittent Progression:  Unchanged Chronicity:  Recurrent Relieved by:  None tried Worsened by:  Nothing Ineffective treatments:  None tried Associated symptoms: abdominal pain   Associated symptoms: no fever, no headache and no shortness of breath   Risk factors: diabetes mellitus and smoking   Abdominal Pain Pain location:  Epigastric Pain quality: aching   Pain radiates to:  Chest Relieved by:  None tried Worsened by:  Nothing Ineffective treatments:  None tried Associated symptoms: chest pain   Associated symptoms: no dysuria, no fever, no shortness of breath and no sore throat       Past Medical History:  Diagnosis Date   Anxiety    Depressed bipolar disorder ([place])    Diabetes mellitus without complication ([place])    Hypertension    Osteomyelitis of left foot ([place]) 10/19/2019   Osteomyelitis of toe of  left foot (HCC)    S/P transmetatarsal amputation of foot, left ([place]) 09/28/2018    Patient Active Problem List   Diagnosis Date Noted   Leukocytosis 10/10/2020   GERD (gastroesophageal reflux disease) 10/10/2020   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/06/2020   Hyponatremia 10/23/2019   MRSA bacteremia 10/21/2019   Atrial flutter with rapid ventricular response ([place]) 10/21/2019   Lactic acidosis 10/21/2019   Severe protein-calorie malnutrition ([place])    Diabetic polyneuropathy associated with type 2 diabetes mellitus ([place])    Cirrhosis of liver ([place]) 10/19/2019   Anemia 10/19/2019   Medication monitoring encounter 08/15/2019   Serum total bilirubin elevated 10/29/2018   BPH (benign prostatic hyperplasia) 10/29/2018   Below-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)    PAF (paroxysmal atrial fibrillation) (HCC)    Hypokalemia    Hypomagnesemia    Sepsis without acute organ dysfunction ([place]) 10/05/2018   Depressed bipolar disorder ([place]) 10/05/2018   Hypertension 10/05/2018   Hyperbilirubinemia 10/05/2018   Homelessness 05/07/2018   Diabetes mellitus type II, non insulin dependent ([place]) 05/07/2018    Past Surgical History:  Procedure Laterality Date   AMPUTATION Left 09/28/2018   Procedure: LEFT TRANSMETATARSAL AMPUTATION;  Surgeon: Newt Minion, MD;  Location: [facility];  Service: Orthopedics;  Laterality: Left;   AMPUTATION Left 10/23/2019   Procedure: AMPUTATION BELOW KNEE;  Surgeon: Newt Minion, MD;  Location: [facility];  Service: Orthopedics;  Laterality: Left;   BELOW KNEE LEG AMPUTATION Right    ESOPHAGOGASTRODUODENOSCOPY (EGD) WITH PROPOFOL N/A 10/19/2020   Procedure: ESOPHAGOGASTRODUODENOSCOPY (EGD) WITH PROPOFOL;  Surgeon: Juanita Craver, MD;  Location: [facility] ENDOSCOPY;  Service: Endoscopy;  Laterality: N/A;   TEE WITHOUT CARDIOVERSION N/A 10/25/2019   Procedure: TRANSESOPHAGEAL ECHOCARDIOGRAM (TEE);  Surgeon: Lelon Perla, MD;  Location: [facility] ENDOSCOPY;   Service: Cardiovascular;  Laterality: N/A;       Family History  Problem Relation Age of Onset   Hypertension Other    Diabetes Mellitus II Other     Social History   Tobacco Use   Smoking status: Every Day    Packs/day: 0.25    Pack years: 0.00    Types: Cigarettes   Smokeless tobacco: Never  Vaping Use   Vaping Use: Never used  Substance Use Topics   Alcohol use: Yes   Drug use: Not Currently    Types: Cocaine    Home Medications Prior to Admission medications   Medication Sig Start Date End Date Taking? Authorizing Provider  Blood Glucose Monitoring Suppl (ACCU-CHEK AVIVA PLUS) w/Device KIT  05/23/19   [provider]  clindamycin (CLEOCIN) 300 MG capsule TAKE 1 CAPSULE (300 MG TOTAL) BY MOUTH EVERY EIGHT HOURS FOR 4 DAYS. 10/20/20 10/20/21  Shelly Coss, MD  ferrous sulfate 325 (65 FE) MG tablet Take 1 tablet (325 mg total) by mouth daily with breakfast. Patient not taking: Reported on 10/10/2020 11/06/19   Dessa Phi, DO  fluconazole (DIFLUCAN) 100 MG tablet TAKE 1 TABLET (100 MG TOTAL) BY MOUTH DAILY FOR 6 DAYS. 10/20/20 10/20/21  Shelly Coss, MD  metFORMIN (GLUCOPHAGE) 1000 MG tablet Take 1 tablet (1,000 mg total) by mouth daily. Patient not taking: Reported on 10/10/2020 10/21/19   Murlean Iba, MD  Multiple Vitamin (MULTIVITAMIN WITH MINERALS) TABS tablet Take 1 tablet by mouth daily. Patient not taking: Reported on 10/10/2020 05/11/18   Edwin Dada, MD  pantoprazole (PROTONIX) 40 MG tablet Take 1 tablet (40 mg total) by mouth 2 (two) times daily. 10/20/20 10/20/21  Shelly Coss, MD  pantoprazole (PROTONIX) 40 MG tablet TAKE 1 TABLET (40 MG TOTAL) BY MOUTH TWO TIMES DAILY. 10/20/20 10/20/21  Shelly Coss, MD  polyethylene glycol (MIRALAX / GLYCOLAX) 17 g packet Take 17 g by mouth daily as needed for moderate constipation. 10/20/20   Shelly Coss, MD  polyethylene glycol powder (GLYCOLAX/MIRALAX) 17 GM/SCOOP powder DISSOLVE 1 CAPFUL  (17 G) IN WATER AND DRINK DAILY AS NEEDED FOR MODERATE CONSTIPATION. 10/20/20 10/20/21  Shelly Coss, MD  QUEtiapine (SEROQUEL) 300 MG tablet Take 1 tablet (300 mg total) by mouth at bedtime. 10/20/20   Shelly Coss, MD  sertraline (ZOLOFT) 50 MG tablet Take 1 tablet (50 mg total) by mouth daily. 10/20/20   Shelly Coss, MD  sertraline (ZOLOFT) 50 MG tablet TAKE 1 TABLET (50 MG TOTAL) BY MOUTH DAILY. 10/20/20 10/20/21  Shelly Coss, MD  sucralfate (CARAFATE) 1 GM/10ML suspension Take 10 mLs (1 g total) by mouth 4 (four) times daily -  with meals and at bedtime for 14 days. 10/20/20 11/03/20  Shelly Coss, MD  sucralfate (CARAFATE) 1 GM/10ML suspension TAKE 10 MLS (1 G TOTAL) BY MOUTH FOUR TIMES DAILY - WITH MEALS AND AT BEDTIME FOR 14 DAYS. 10/20/20 10/20/21  Shelly Coss, MD  traZODone (DESYREL) 150 MG tablet Take 1 tablet (150 mg total) by mouth at bedtime. 10/20/20   Shelly Coss, MD  traZODone (DESYREL) 150 MG tablet TAKE 1 TABLET (150 MG  TOTAL) BY MOUTH AT BEDTIME. 10/20/20 10/20/21  Shelly Coss, MD    Allergies    Patient has no known allergies.  Review of Systems   Review of Systems  Constitutional:  Negative for fever.  HENT:  Negative for sore throat.   Eyes:  Negative for visual disturbance.  Respiratory:  Negative for shortness of breath.   Cardiovascular:  Positive for chest pain.  Gastrointestinal:  Positive for abdominal pain.  Genitourinary:  Negative for dysuria.  Musculoskeletal:  Positive for gait problem.  Skin:  Negative for rash.  Neurological:  Negative for headaches.  Psychiatric/Behavioral:  The patient is nervous/anxious.    Physical Exam Updated Vital Signs BP 124/86 (BP Location: Right Arm)   Pulse (!) 110   Temp 98.1 F (36.7 C) (Oral)   Resp 18   Ht _0  (1.93 m)   Wt 108.9 kg   SpO2 99%   BMI 29.21 kg/m   Physical Exam Vitals and nursing note reviewed.  Constitutional:      Appearance: He is well-developed.  HENT:     Head:  Normocephalic and atraumatic.  Eyes:     Conjunctiva/sclera: Conjunctivae normal.  Cardiovascular:     Rate and Rhythm: Normal rate and regular rhythm.     Heart sounds: Normal heart sounds. No murmur heard. Pulmonary:     Effort: Pulmonary effort is normal. No respiratory distress.     Breath sounds: Normal breath sounds.  Abdominal:     Palpations: Abdomen is soft. There is no mass.     Tenderness: There is no abdominal tenderness. There is no guarding.  Musculoskeletal:        General: Normal range of motion.     Cervical back: Neck supple.     Comments: Bilateral BKA  Skin:    General: Skin is warm and dry.     Capillary Refill: Capillary refill takes less than 2 seconds.  Neurological:     General: No focal deficit present.     Mental Status: He is alert.    ED Results / Procedures / Treatments   Labs (all labs ordered are listed, but only abnormal results are displayed) Labs Reviewed  BASIC METABOLIC PANEL - Abnormal; Notable for the following components:      Result Value   CO2 19 (*)    Glucose, Bld 183 (*)    BUN 21 (*)    Anion gap 16 (*)    All other components within normal limits  CBC - Abnormal; Notable for the following components:   WBC 16.8 (*)    All other components within normal limits  TROPONIN I (HIGH SENSITIVITY) - Abnormal; Notable for the following components:   Troponin I (High Sensitivity) 57 (*)    All other components within normal limits  TROPONIN I (HIGH SENSITIVITY) - Abnormal; Notable for the following components:   Troponin I (High Sensitivity) 71 (*)    All other components within normal limits    EKG EKG Interpretation  Date/Time:  Thursday January 28 2021 16:41:18 EDT Ventricular Rate:  103 PR Interval:    QRS Duration: 98 QT Interval:  402 QTC Calculation: 526 R Axis:   198 Text Interpretation: Atrial fibrillation with rapid ventricular response Indeterminate axis Anterolateral infarct , age undetermined Prolonged QT Abnormal  ECG afib  and increased rate from prior 3/22 Confirmed by Aletta Edouard [phone]) on 01/28/2021 5:14:22 PM  Radiology DG Chest 2 View  Result Date: 01/28/2021 CLINICAL DATA:  Chest pain EXAM: CHEST -  2 VIEW COMPARISON:  10/10/2020, CT 10/10/2020 FINDINGS: No consolidation, features of edema, pneumothorax, or effusion. Pulmonary vascularity is normally distributed. The cardiomediastinal contours are unremarkable. No acute osseous or soft tissue abnormality. Telemetry leads overlie the chest. IMPRESSION: No acute cardiopulmonary abnormality. Electronically Signed   By: Lovena Le M.D.   On: 01/28/2021 18:13    Procedures Procedures   Medications Ordered in ED Medications  pantoprazole (PROTONIX) injection 40 mg (40 mg Intravenous Given 01/28/21 1800)  sucralfate (CARAFATE) tablet 1 g (1 g Oral Given 01/28/21 1801)    ED Course  I have reviewed the triage vital signs and the nursing notes.  Pertinent labs & imaging results that were available during my care of the patient were reviewed by me and considered in my medical decision making (see chart for details).  Clinical Course as of 01/29/21 1343  Thu Jan 28, 2021  2010 [facility] consult was placed.  She is arranging for him to get some home health services.  She said to send a prescription to the pharmacy for him to pick up.  It is unclear if his insurance will cover as they have already paid out for this. [MB]    Clinical Course User Index [MB] Hayden Rasmussen, MD   MDM Rules/Calculators/A&P                         CHA2DS2/VAS Stroke Risk Points  Current as of today     3 >= 2 Points: High Risk  1 - 1.99 Points: Medium Risk  0 Points: Low Risk    Last Change: N/A      Details    This score determines the patient's risk of having a stroke if the  patient has atrial fibrillation.       Points Metrics  1 Has Congestive Heart Failure:  Yes    Current as of today  0 Has Vascular Disease:  No    Current as of today  1 Has  Hypertension:  Yes    Current as of today  0 Age:  47    Current as of today  1 Has Diabetes:  Yes    Current as of today  0 Had Stroke:  No  Had TIA:  No  Had Thromboembolism:  No    Current as of today  0 Male:  No    Current as of today    This patient complains of out of his medications, vague chest and upper abdominal pain, needing a Education officer, museum; this involves an extensive number of treatment Options and is a complaint that carries with it a high risk of complications and Morbidity. The differential includes medication refill, ACS, peptic ulcer disease, anemia, metabolic derangement, psychiatric disorder, psychosocial issues  I ordered, reviewed and interpreted labs, which included CBC with elevated white count, normal hemoglobin, chemistries fairly normal other than bicarb similar to priors, troponins mildly elevated but relatively flat I ordered medication IV Protonix and oral Carafate with improvement in his symptoms I ordered imaging studies which included chest x-ray and I independently    visualized and interpreted imaging which showed no acute findings  Previous records obtained and reviewed in epic including prior ED visits I consulted transitions of care and discussed lab and imaging findings  Critical Interventions: None  After the interventions stated above, I reevaluated the patient and found patient to be hemodynamically stable.  Borderline tachycardia.  A. fib does not appear to be new  and unclear how long has been in it.  Prior notes indicate that he is not a candidate for anticoagulation.  Social work was working on getting him some increased services at home and recommends sending prescriptions to his pharmacy for restart.  Was informed by the patient's nurse that he was asking to be discharged.  Went back to his room and we have discussed his work-up so far.  He said he is calling for a ride and he will go pick up his prescriptions.  Return instructions were  discussed.          Final Clinical Impression(s) / ED Diagnoses Final diagnoses:  Nonspecific chest pain  Non-intractable vomiting with nausea, unspecified vomiting type  Encounter for medication refill  Atrial fibrillation, unspecified type [facility])    Rx / DC Orders ED Discharge Orders          Ordered    pantoprazole (PROTONIX) 40 MG tablet  2 times daily        01/28/21 2113    QUEtiapine (SEROQUEL) 300 MG tablet  Daily at bedtime        01/28/21 2113    sertraline (ZOLOFT) 50 MG tablet  Daily        01/28/21 2113    traZODone (DESYREL) 150 MG tablet  Daily at bedtime        01/28/21 2113    metFORMIN (GLUCOPHAGE) 1000 MG tablet  Daily        01/28/21 2113             Hayden Rasmussen, MD 01/29/21 1348

## 2021-01-28 NOTE — Discharge Instructions (Addendum)
You were seen in the emergency department for chest pain abdominal pain nausea vomiting and being out of your medications.  You had lab work done along with chest x-ray and an EKG.  We have sent refills of your medication to your pharmacy.  Please contact your primary care doctor for close follow-up.  Return to the emergency department if any worsening or concerning symptoms.

## 2021-03-31 ENCOUNTER — Telehealth: Payer: Self-pay

## 2021-03-31 NOTE — Telephone Encounter (Signed)
[employer] called stating that the auth that was sent in to [facility] was denied since it was not covered.   Some codes that are covered through patients insurance.  [id number] , [id number] , [id number] , [id number] , [id number] , [id number] , [id number] , [id number] , [id number] These are codes that the recommend to use for the patient since they are covered.   [phone] - summer

## 2021-04-01 NOTE — Telephone Encounter (Signed)
I sent a message to [facility] to see if there was anything that we needed to do.

## 2021-04-11 ENCOUNTER — Emergency Department (HOSPITAL_COMMUNITY): Payer: Medicare Other

## 2021-04-11 ENCOUNTER — Emergency Department (HOSPITAL_COMMUNITY)
Admission: EM | Admit: 2021-04-11 | Discharge: 2021-04-12 | Disposition: A | Discharge status: Home / Self Care | Payer: Medicare Other | Attending: Emergency Medicine | Admitting: Emergency Medicine

## 2021-04-11 ENCOUNTER — Encounter (HOSPITAL_COMMUNITY): Payer: Self-pay

## 2021-04-11 DIAGNOSIS — F17210 Nicotine dependence, cigarettes, uncomplicated: Secondary | ICD-10-CM | POA: Insufficient documentation

## 2021-04-11 DIAGNOSIS — Z79899 Other long term (current) drug therapy: Secondary | ICD-10-CM | POA: Diagnosis not present

## 2021-04-11 DIAGNOSIS — I10 Essential (primary) hypertension: Secondary | ICD-10-CM | POA: Diagnosis not present

## 2021-04-11 DIAGNOSIS — E1142 Type 2 diabetes mellitus with diabetic polyneuropathy: Secondary | ICD-10-CM | POA: Diagnosis not present

## 2021-04-11 DIAGNOSIS — K219 Gastro-esophageal reflux disease without esophagitis: Secondary | ICD-10-CM | POA: Diagnosis not present

## 2021-04-11 DIAGNOSIS — Z760 Encounter for issue of repeat prescription: Secondary | ICD-10-CM

## 2021-04-11 DIAGNOSIS — R072 Precordial pain: Secondary | ICD-10-CM

## 2021-04-11 DIAGNOSIS — Z7984 Long term (current) use of oral hypoglycemic drugs: Secondary | ICD-10-CM | POA: Diagnosis not present

## 2021-04-11 DIAGNOSIS — R079 Chest pain, unspecified: Secondary | ICD-10-CM | POA: Diagnosis present

## 2021-04-11 LAB — CBC
HCT: 33.3 % — ABNORMAL LOW (ref 39.0–52.0)
Hemoglobin: 10.7 g/dL — ABNORMAL LOW (ref 13.0–17.0)
MCH: 30.1 pg (ref 26.0–34.0)
MCHC: 32.1 g/dL (ref 30.0–36.0)
MCV: 93.5 fL (ref 80.0–100.0)
Platelets: 125 10*3/uL — ABNORMAL LOW (ref 150–400)
RBC: 3.56 MIL/uL — ABNORMAL LOW (ref 4.22–5.81)
RDW: 15 % (ref 11.5–15.5)
WBC: 10.4 10*3/uL (ref 4.0–10.5)
nRBC: 0 % (ref 0.0–0.2)

## 2021-04-11 LAB — BASIC METABOLIC PANEL
Anion gap: 10 (ref 5–15)
BUN: 9 mg/dL (ref 6–20)
CO2: 18 mmol/L — ABNORMAL LOW (ref 22–32)
Calcium: 9.3 mg/dL (ref 8.9–10.3)
Chloride: 105 mmol/L (ref 98–111)
Creatinine, Ser: 0.7 mg/dL (ref 0.61–1.24)
GFR, Estimated: >60 mL/min (ref >60)
Glucose, Bld: 172 mg/dL — ABNORMAL HIGH (ref 70–99)
Potassium: 4 mmol/L (ref 3.5–5.1)
Sodium: 133 mmol/L — ABNORMAL LOW (ref 135–145)

## 2021-04-11 LAB — TROPONIN I (HIGH SENSITIVITY)
Troponin I (High Sensitivity): 5 ng/L (ref <18)
Troponin I (High Sensitivity): 3 ng/L (ref <18)

## 2021-04-11 LAB — CBG MONITORING, ED: Glucose-Capillary: 177 mg/dL — ABNORMAL HIGH (ref 70–99)

## 2021-04-11 NOTE — ED Provider Notes (Signed)
Emergency Medicine Provider Triage Evaluation Note  CARVELL HOEFFNER , a 60 y.o. male  was evaluated in triage.  Pt complains of centralized left-sided chest pain for the past 2 days.  Reports it is a constant burning, pressure.  However, patient reports he has not been taking any of his medication this includes gastric medications that he was supposed to pick up today.  Reports the last couple days he has not been able to eat, he did have a couple episodes of vomiting with EMS.  Review of Systems  Positive: Chest pain, nausea, vomiting Negative: Shortness of breath, abdominal pain.  Physical Exam  There were no vitals taken for this visit. Gen:   Awake, no distress   Resp:  Normal effort  MSK:   Moves extremities without difficulty  Other:    Medical Decision Making  Medically screening exam initiated at 6:48 PM.  Appropriate orders placed.  ASH MCELWAIN was informed that the remainder of the evaluation will be completed by another provider, this initial triage assessment does not replace that evaluation, and the importance of remaining in the ED until their evaluation is complete.  Patient presents to the ED via EMS with a chief complaint of chest pain, nausea, vomiting.  Has not been taking his medication including psychiatric medications for the past 3 days.  He does have a prior history of BKA.  Does have a history of diabetes, CBG checked in triage 177, alert and oriented x4.  Labs have been ordered and work-up has been started.   Claude Manges, PA-C 04/11/21 1855    Maia Plan, MD 04/18/21 1734

## 2021-04-11 NOTE — ED Triage Notes (Signed)
Pt via [facility] for eval of centralized-L sided CP x2 days, burning, pressure, pt has been belching w EMS. 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

## 2021-04-11 NOTE — ED Notes (Signed)
Pt advises that he's out of "all his meds," states he "gets this way" when that happens

## 2021-04-12 ENCOUNTER — Other Ambulatory Visit (HOSPITAL_COMMUNITY): Payer: Self-pay

## 2021-04-12 ENCOUNTER — Encounter (HOSPITAL_COMMUNITY): Payer: Self-pay | Admitting: Emergency Medicine

## 2021-04-12 MED ORDER — PANTOPRAZOLE SODIUM 40 MG PO TBEC
40.0000 mg | DELAYED_RELEASE_TABLET | Freq: Two times a day (BID) | ORAL | 0 refills | Status: DC
  Filled 2021-04-12: qty 60, 30d supply, fill #0

## 2021-04-12 MED ORDER — ALUM & MAG HYDROXIDE-SIMETH 200-200-20 MG/5ML PO SUSP
30.0000 mL | Freq: Once | ORAL | Status: AC
  Administered 2021-04-12: 30 mL via ORAL
  Filled 2021-04-12: qty 30

## 2021-04-12 MED ORDER — SERTRALINE HCL 50 MG PO TABS
50.0000 mg | ORAL_TABLET | ORAL | Status: AC
  Administered 2021-04-12: 50 mg via ORAL
  Filled 2021-04-12 (×2): qty 1

## 2021-04-12 MED ORDER — QUETIAPINE FUMARATE 300 MG PO TABS
300.0000 mg | ORAL_TABLET | Freq: Every day | ORAL | 0 refills | Status: DC
  Filled 2021-04-12: qty 2, 2d supply, fill #0

## 2021-04-12 MED ORDER — SUCRALFATE 1 GM/10ML PO SUSP
1.0000 g | Freq: Three times a day (TID) | ORAL | 0 refills | Status: DC
  Filled 2021-04-12: qty 560, 14d supply, fill #0

## 2021-04-12 MED ORDER — METFORMIN HCL 1000 MG PO TABS
1000.0000 mg | ORAL_TABLET | Freq: Every day | ORAL | 0 refills | Status: DC
  Filled 2021-04-12: qty 30, 30d supply, fill #0

## 2021-04-12 NOTE — ED Notes (Signed)
Called [facility] to transport patient home.  

## 2021-04-12 NOTE — ED Notes (Addendum)
[facility] here for transport. Pt alert, NAD, calm, interactive, resps e/u, speaking in clear complete sentences.

## 2021-04-12 NOTE — ED Provider Notes (Signed)
[facility] EMERGENCY DEPARTMENT Provider Note   CSN: [id number] Arrival date & time: 04/11/21  1840     History Chief Complaint  Patient presents with   Chest Pain    Cory Palmer is a 60 y.o. male.  The history is provided by the patient.  Chest Pain Pain location:  L chest Pain quality: pressure   Pain radiates to:  Does not radiate Pain severity:  Moderate Onset quality:  Gradual Duration:  3 days Timing:  Constant Progression:  Unchanged Chronicity:  Recurrent Context: at rest   Context comment:  Belching a lot.  Is off his GI medications and out of metformin.  Has refills on psych meds but pharmacy is out of them Relieved by:  Nothing Worsened by:  Nothing Associated symptoms: no back pain, no cough, no fever, no orthopnea, no palpitations, no shortness of breath and no weakness   Risk factors: no aortic disease       Past Medical History:  Diagnosis Date   Anxiety    Depressed bipolar disorder ([place])    Diabetes mellitus without complication ([place])    Hypertension    Osteomyelitis of left foot ([place]) 10/19/2019   Osteomyelitis of toe of left foot ([place])    S/P transmetatarsal amputation of foot, left ([place]) 09/28/2018    Patient Active Problem List   Diagnosis Date Noted   Leukocytosis 10/10/2020   GERD (gastroesophageal reflux disease) 10/10/2020   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   Hyponatremia 10/23/2019   MRSA bacteremia 10/21/2019   Atrial flutter with rapid ventricular response ([place]) 10/21/2019   Lactic acidosis 10/21/2019   Severe protein-calorie malnutrition ([place])    Diabetic polyneuropathy associated with type 2 diabetes mellitus ([place])    Cirrhosis of liver ([place]) 10/19/2019   Anemia 10/19/2019   Medication monitoring encounter 08/15/2019   Serum total bilirubin elevated 10/29/2018   BPH (benign prostatic hyperplasia) 10/29/2018   Below-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)    PAF  (paroxysmal atrial fibrillation) ([place])    Hypokalemia    Hypomagnesemia    Sepsis without acute organ dysfunction ([place]) 10/05/2018   Depressed bipolar disorder ([place]) 10/05/2018   Hypertension 10/05/2018   Hyperbilirubinemia 10/05/2018   Homelessness 05/07/2018   Diabetes mellitus type II, non insulin dependent ([place]) 05/07/2018    Past Surgical History:  Procedure Laterality Date   AMPUTATION Left 09/28/2018   Procedure: LEFT TRANSMETATARSAL AMPUTATION;  Surgeon: Newt Minion, MD;  Location: [place];  Service: Orthopedics;  Laterality: Left;   AMPUTATION Left 10/23/2019   Procedure: AMPUTATION BELOW KNEE;  Surgeon: Newt Minion, MD;  Location: [place];  Service: Orthopedics;  Laterality: Left;   BELOW KNEE LEG AMPUTATION Right    ESOPHAGOGASTRODUODENOSCOPY (EGD) WITH PROPOFOL N/A 10/19/2020   Procedure: ESOPHAGOGASTRODUODENOSCOPY (EGD) WITH PROPOFOL;  Surgeon: Juanita Craver, MD;  Location: [facility] ENDOSCOPY;  Service: Endoscopy;  Laterality: N/A;   TEE WITHOUT CARDIOVERSION N/A 10/25/2019   Procedure: TRANSESOPHAGEAL ECHOCARDIOGRAM (TEE);  Surgeon: Lelon Perla, MD;  Location: [facility] ENDOSCOPY;  Service: Cardiovascular;  Laterality: N/A;       Family History  Problem Relation Age of Onset   Hypertension Other    Diabetes Mellitus II Other     Social History   Tobacco Use   Smoking status: Every Day    Packs/day: 0.25    Types: Cigarettes   Smokeless tobacco: Never  Vaping Use   Vaping Use: Never used  Substance Use Topics  Alcohol use: Yes   Drug use: Not Currently    Types: Cocaine    Home Medications Prior to Admission medications   Medication Sig Start Date End Date Taking? Authorizing Provider  Blood Glucose Monitoring Suppl (ACCU-CHEK AVIVA PLUS) w/Device KIT  05/23/19   [provider]  clindamycin (CLEOCIN) 300 MG capsule TAKE 1 CAPSULE (300 MG TOTAL) BY MOUTH EVERY EIGHT HOURS FOR 4 DAYS. 10/20/20 10/20/21  Shelly Coss, MD  ferrous sulfate 325 (65 FE) MG  tablet Take 1 tablet (325 mg total) by mouth daily with breakfast. Patient not taking: Reported on 10/10/2020 11/06/19   Dessa Phi, DO  fluconazole (DIFLUCAN) 100 MG tablet TAKE 1 TABLET (100 MG TOTAL) BY MOUTH DAILY FOR 6 DAYS. 10/20/20 10/20/21  Shelly Coss, MD  metFORMIN (GLUCOPHAGE) 1000 MG tablet Take 1 tablet (1,000 mg total) by mouth daily. 04/12/21   Therisa Mennella, MD  Multiple Vitamin (MULTIVITAMIN WITH MINERALS) TABS tablet Take 1 tablet by mouth daily. Patient not taking: Reported on 10/10/2020 05/11/18   Edwin Dada, MD  pantoprazole (PROTONIX) 40 MG tablet TAKE 1 TABLET (40 MG TOTAL) BY MOUTH TWO TIMES DAILY. 04/12/21 05/12/21  Launa Goedken, MD  polyethylene glycol (MIRALAX / GLYCOLAX) 17 g packet Take 17 g by mouth daily as needed for moderate constipation. 10/20/20   Shelly Coss, MD  polyethylene glycol powder (GLYCOLAX/MIRALAX) 17 GM/SCOOP powder DISSOLVE 1 CAPFUL (17 G) IN WATER AND DRINK DAILY AS NEEDED FOR MODERATE CONSTIPATION. 10/20/20 10/20/21  Shelly Coss, MD  QUEtiapine (SEROQUEL) 300 MG tablet Take 1 tablet (300 mg total) by mouth at bedtime. 04/12/21   Avya Flavell, MD  sertraline (ZOLOFT) 50 MG tablet Take 1 tablet (50 mg total) by mouth daily. 01/28/21   Hayden Rasmussen, MD  sucralfate (CARAFATE) 1 GM/10ML suspension TAKE 10 MLS (1 G TOTAL) BY MOUTH FOUR TIMES DAILY - WITH MEALS AND AT BEDTIME FOR 14 DAYS. 10/20/20 10/20/21  Shelly Coss, MD  sucralfate (CARAFATE) 1 GM/10ML suspension Take 10 mLs (1 g total) by mouth 4 (four) times daily -  with meals and at bedtime for 14 days. 04/12/21 04/26/21  Devann Cribb, MD  traZODone (DESYREL) 150 MG tablet Take 1 tablet (150 mg total) by mouth at bedtime. 01/28/21   Hayden Rasmussen, MD    Allergies    Patient has no known allergies.  Review of Systems   Review of Systems  Constitutional:  Negative for fever.  HENT:  Negative for facial swelling.   Eyes:  Negative for redness.  Respiratory:  Negative  for cough and shortness of breath.   Cardiovascular:  Positive for chest pain. Negative for palpitations and orthopnea.  Gastrointestinal:  Negative for diarrhea.  Genitourinary:  Negative for difficulty urinating.  Musculoskeletal:  Negative for back pain.  Skin:  Negative for rash.  Neurological:  Negative for facial asymmetry and weakness.  Psychiatric/Behavioral:  Negative for agitation.   All other systems reviewed and are negative.  Physical Exam Updated Vital Signs BP 122/86 (BP Location: Right Arm)   Pulse 78   Temp 97.8 F (36.6 C)   Resp 18   SpO2 100%   Physical Exam Vitals and nursing note reviewed.  Constitutional:      General: He is not in acute distress.    Appearance: Normal appearance.  HENT:     Head: Normocephalic and atraumatic.     Nose: Nose normal.  Eyes:     Conjunctiva/sclera: Conjunctivae normal.     Pupils: Pupils are  equal, round, and reactive to light.  Cardiovascular:     Rate and Rhythm: Normal rate and regular rhythm.     Pulses: Normal pulses.     Heart sounds: Normal heart sounds.  Pulmonary:     Effort: Pulmonary effort is normal.     Breath sounds: Normal breath sounds.  Abdominal:     General: Abdomen is flat. Bowel sounds are normal.     Palpations: Abdomen is soft.     Tenderness: There is no abdominal tenderness. There is no guarding.  Musculoskeletal:        General: Normal range of motion.     Cervical back: Normal range of motion and neck supple.     Right lower leg: No edema.     Left lower leg: No edema.  Skin:    General: Skin is warm and dry.     Capillary Refill: Capillary refill takes less than 2 seconds.  Neurological:     General: No focal deficit present.     Mental Status: He is alert and oriented to person, place, and time.     Deep Tendon Reflexes: Reflexes normal.  Psychiatric:        Mood and Affect: Mood normal.        Behavior: Behavior normal.    ED Results / Procedures / Treatments   Labs (all  labs ordered are listed, but only abnormal results are displayed) Results for orders placed or performed during the hospital encounter of 64/33/29  Basic metabolic panel  Result Value Ref Range   Sodium 133 (L) 135 - 145 mmol/L   Potassium 4.0 3.5 - 5.1 mmol/L   Chloride 105 98 - 111 mmol/L   CO2 18 (L) 22 - 32 mmol/L   Glucose, Bld 172 (H) 70 - 99 mg/dL   BUN 9 6 - 20 mg/dL   Creatinine, Ser 0.70 0.61 - 1.24 mg/dL   Calcium 9.3 8.9 - 10.3 mg/dL   GFR, Estimated >60 >60 mL/min   Anion gap 10 5 - 15  CBC  Result Value Ref Range   WBC 10.4 4.0 - 10.5 K/uL   RBC 3.56 (L) 4.22 - 5.81 MIL/uL   Hemoglobin 10.7 (L) 13.0 - 17.0 g/dL   HCT 33.3 (L) 39.0 - 52.0 %   MCV 93.5 80.0 - 100.0 fL   MCH 30.1 26.0 - 34.0 pg   MCHC 32.1 30.0 - 36.0 g/dL   RDW 15.0 11.5 - 15.5 %   Platelets 125 (L) 150 - 400 K/uL   nRBC 0.0 0.0 - 0.2 %  CBG monitoring, ED  Result Value Ref Range   Glucose-Capillary 177 (H) 70 - 99 mg/dL  Troponin I (High Sensitivity)  Result Value Ref Range   Troponin I (High Sensitivity) 5 <18 ng/L  Troponin I (High Sensitivity)  Result Value Ref Range   Troponin I (High Sensitivity) 3 <18 ng/L   DG Chest 2 View  Result Date: 04/11/2021 CLINICAL DATA:  Chest pain, vomiting EXAM: CHEST - 2 VIEW COMPARISON:  01/28/2021 FINDINGS: The heart size and mediastinal contours are within normal limits. Both lungs are clear. The visualized skeletal structures are unremarkable. IMPRESSION: No active cardiopulmonary disease. Electronically Signed   By: Randa Ngo M.D.   On: 04/11/2021 19:45     EKG EKG Interpretation  Date/Time:  Sunday April 11 2021 18:51:47 EDT Ventricular Rate:  82 PR Interval:  198 QRS Duration: 102 QT Interval:  404 QTC Calculation: 472 R Axis:   48  Text Interpretation: Normal sinus rhythm Normal ECG When compared with ECG of 01/28/2021, Sinus rhythm has replaced Atrial fibrillation QT has shortened Confirmed by Delora Fuel ([id number]) on 04/11/2021 11:36:12  PM  Radiology DG Chest 2 View  Result Date: 04/11/2021 CLINICAL DATA:  Chest pain, vomiting EXAM: CHEST - 2 VIEW COMPARISON:  01/28/2021 FINDINGS: The heart size and mediastinal contours are within normal limits. Both lungs are clear. The visualized skeletal structures are unremarkable. IMPRESSION: No active cardiopulmonary disease. Electronically Signed   By: Randa Ngo M.D.   On: 04/11/2021 19:45    Procedures Procedures   Medications Ordered in ED Medications  alum & mag hydroxide-simeth (MAALOX/MYLANTA) 200-200-20 MG/5ML suspension 30 mL (has no administration in time range)  sertraline (ZOLOFT) tablet 50 mg (has no administration in time range)    ED Course  I have reviewed the triage vital signs and the nursing notes.  Pertinent labs & imaging results that were available during my care of the patient were reviewed by me and considered in my medical decision making (see chart for details).    MDM Rules/Calculators/A&P                            Ruled out for MI in the ED, HEART score is 2 low risk for MACE.  Patient's symptoms are consistent with GERD< and he is off his medication.  He will need to follow up with the pharmacy regardin when they get his meds in stock.  Follow up with your PMD for ongoing care.    Cory Palmer was evaluated in Emergency Department on 04/12/2021 for the symptoms described in the history of present illness. He was evaluated in the context of the global COVID-19 pandemic, which necessitated consideration that the patient might be at risk for infection with the SARS-CoV-2 virus that causes COVID-19. Institutional protocols and algorithms that pertain to the evaluation of patients at risk for COVID-19 are in a state of rapid change based on information released by regulatory bodies including the CDC and federal and state organizations. These policies and algorithms were followed during the patient's care in the ED.  Final Clinical Impression(s) / ED  Diagnoses Final diagnoses:  Precordial pain  Gastroesophageal reflux disease, unspecified whether esophagitis present   Return for intractable cough, coughing up blood, fevers > 100.4 unrelieved by medication, shortness of breath, intractable vomiting, chest pain, shortness of breath, weakness, numbness, changes in speech, facial asymmetry, abdominal pain, passing out, Inability to tolerate liquids or food, cough, altered mental status or any concerns. No signs of systemic illness or infection. The patient is nontoxic-appearing on exam and vital signs are within normal limits. I have reviewed the triage vital signs and the nursing notes. Pertinent labs & imaging results that were available during my care of the patient were reviewed by me and considered in my medical decision making (see chart for details). After history, exam, and medical workup I feel the patient has been appropriately medically screened and is safe for discharge home. Pertinent diagnoses were discussed with the patient. Patient was given return precautions.   Rx / DC Orders ED Discharge Orders          Ordered    metFORMIN (GLUCOPHAGE) 1000 MG tablet  Daily        04/12/21 0409    pantoprazole (PROTONIX) 40 MG tablet  2 times daily        04/12/21 0409    sucralfate (  CARAFATE) 1 GM/10ML suspension  3 times daily with meals & bedtime        04/12/21 0409    QUEtiapine (SEROQUEL) 300 MG tablet  Daily at bedtime        04/12/21 0411             Meenakshi Sazama, MD 04/12/21 [id number]

## 2021-04-14 ENCOUNTER — Other Ambulatory Visit (HOSPITAL_COMMUNITY): Payer: Self-pay

## 2021-04-19 ENCOUNTER — Other Ambulatory Visit (HOSPITAL_COMMUNITY): Payer: Self-pay

## 2021-04-21 ENCOUNTER — Other Ambulatory Visit (HOSPITAL_COMMUNITY): Payer: Self-pay

## 2021-04-22 ENCOUNTER — Other Ambulatory Visit (HOSPITAL_COMMUNITY): Payer: Self-pay

## 2021-06-21 ENCOUNTER — Other Ambulatory Visit: Payer: Self-pay

## 2021-06-21 ENCOUNTER — Encounter (HOSPITAL_COMMUNITY): Payer: Self-pay

## 2021-06-21 ENCOUNTER — Emergency Department (HOSPITAL_COMMUNITY)
Admission: EM | Admit: 2021-06-21 | Discharge: 2021-06-22 | Disposition: A | Discharge status: Home / Self Care | Payer: Medicare Other | Attending: Emergency Medicine | Admitting: Emergency Medicine

## 2021-06-21 DIAGNOSIS — Z7984 Long term (current) use of oral hypoglycemic drugs: Secondary | ICD-10-CM | POA: Insufficient documentation

## 2021-06-21 DIAGNOSIS — Z046 Encounter for general psychiatric examination, requested by authority: Secondary | ICD-10-CM | POA: Diagnosis present

## 2021-06-21 DIAGNOSIS — Z20822 Contact with and (suspected) exposure to covid-19: Secondary | ICD-10-CM | POA: Insufficient documentation

## 2021-06-21 DIAGNOSIS — E1142 Type 2 diabetes mellitus with diabetic polyneuropathy: Secondary | ICD-10-CM | POA: Diagnosis not present

## 2021-06-21 DIAGNOSIS — F17210 Nicotine dependence, cigarettes, uncomplicated: Secondary | ICD-10-CM | POA: Diagnosis not present

## 2021-06-21 DIAGNOSIS — Z89512 Acquired absence of left leg below knee: Secondary | ICD-10-CM | POA: Insufficient documentation

## 2021-06-21 DIAGNOSIS — R45851 Suicidal ideations: Secondary | ICD-10-CM | POA: Diagnosis not present

## 2021-06-21 DIAGNOSIS — Z89511 Acquired absence of right leg below knee: Secondary | ICD-10-CM | POA: Diagnosis not present

## 2021-06-21 DIAGNOSIS — I10 Essential (primary) hypertension: Secondary | ICD-10-CM | POA: Diagnosis not present

## 2021-06-21 DIAGNOSIS — F32A Depression, unspecified: Secondary | ICD-10-CM

## 2021-06-21 LAB — COMPREHENSIVE METABOLIC PANEL
ALT: 20 U/L (ref 0–44)
AST: 31 U/L (ref 15–41)
Albumin: 4.5 g/dL (ref 3.5–5.0)
Alkaline Phosphatase: 81 U/L (ref 38–126)
Anion gap: 12 (ref 5–15)
BUN: 14 mg/dL (ref 8–23)
CO2: 19 mmol/L — ABNORMAL LOW (ref 22–32)
Calcium: 9.3 mg/dL (ref 8.9–10.3)
Chloride: 103 mmol/L (ref 98–111)
Creatinine, Ser: 0.73 mg/dL (ref 0.61–1.24)
GFR, Estimated: >60 mL/min (ref >60)
Glucose, Bld: 88 mg/dL (ref 70–99)
Potassium: 3.4 mmol/L — ABNORMAL LOW (ref 3.5–5.1)
Sodium: 134 mmol/L — ABNORMAL LOW (ref 135–145)
Total Bilirubin: 2.3 mg/dL — ABNORMAL HIGH (ref 0.3–1.2)
Total Protein: 7.9 g/dL (ref 6.5–8.1)

## 2021-06-21 LAB — CBC WITH DIFFERENTIAL/PLATELET
Abs Immature Granulocytes: 0.03 10*3/uL (ref 0.00–0.07)
Basophils Absolute: 0.1 10*3/uL (ref 0.0–0.1)
Basophils Relative: 1 %
Eosinophils Absolute: 0.4 10*3/uL (ref 0.0–0.5)
Eosinophils Relative: 5 %
HCT: 37.1 % — ABNORMAL LOW (ref 39.0–52.0)
Hemoglobin: 12.5 g/dL — ABNORMAL LOW (ref 13.0–17.0)
Immature Granulocytes: 0 %
Lymphocytes Relative: 23 %
Lymphs Abs: 2 10*3/uL (ref 0.7–4.0)
MCH: 28.9 pg (ref 26.0–34.0)
MCHC: 33.7 g/dL (ref 30.0–36.0)
MCV: 85.7 fL (ref 80.0–100.0)
Monocytes Absolute: 0.7 10*3/uL (ref 0.1–1.0)
Monocytes Relative: 8 %
Neutro Abs: 5.7 10*3/uL (ref 1.7–7.7)
Neutrophils Relative %: 63 %
Platelets: 127 10*3/uL — ABNORMAL LOW (ref 150–400)
RBC: 4.33 MIL/uL (ref 4.22–5.81)
RDW: 18.1 % — ABNORMAL HIGH (ref 11.5–15.5)
WBC: 9 10*3/uL (ref 4.0–10.5)
nRBC: 0 % (ref 0.0–0.2)

## 2021-06-21 LAB — RESP PANEL BY RT-PCR (FLU A&B, COVID) ARPGX2
Influenza A by PCR: NEGATIVE
Influenza B by PCR: NEGATIVE
SARS Coronavirus 2 by RT PCR: NEGATIVE

## 2021-06-21 LAB — SALICYLATE LEVEL: Salicylate Lvl: <7 mg/dL — ABNORMAL LOW (ref 7.0–30.0)

## 2021-06-21 LAB — ACETAMINOPHEN LEVEL: Acetaminophen (Tylenol), Serum: <10 ug/mL — ABNORMAL LOW (ref 10–30)

## 2021-06-21 LAB — ETHANOL: Alcohol, Ethyl (B): <10 mg/dL (ref <10)

## 2021-06-21 MED ORDER — SERTRALINE HCL 50 MG PO TABS
50.0000 mg | ORAL_TABLET | Freq: Every day | ORAL | 0 refills | Status: DC
Start: 2021-06-21 — End: 2022-03-29

## 2021-06-21 MED ORDER — TRAZODONE HCL 150 MG PO TABS: 150.0000 mg | ORAL_TABLET | Freq: Every day | ORAL | 0 refills | Status: DC

## 2021-06-21 MED ORDER — METFORMIN HCL 1000 MG PO TABS: 1000.0000 mg | ORAL_TABLET | Freq: Two times a day (BID) | ORAL | 0 refills | Status: DC

## 2021-06-21 MED ORDER — QUETIAPINE FUMARATE 300 MG PO TABS
300.0000 mg | ORAL_TABLET | Freq: Every day | ORAL | Status: DC
  Administered 2021-06-21: 300 mg via ORAL
  Filled 2021-06-21: qty 1

## 2021-06-21 MED ORDER — TRAZODONE HCL 100 MG PO TABS
150.0000 mg | ORAL_TABLET | Freq: Every day | ORAL | Status: DC
  Administered 2021-06-21: 150 mg via ORAL
  Filled 2021-06-21: qty 2

## 2021-06-21 NOTE — ED Notes (Signed)
Pt currently calm and cooperative with staff, no signs of aggression.

## 2021-06-21 NOTE — Discharge Instructions (Addendum)
Call your primary care doctor or specialist as discussed in the next 2-3 days.   Return immediately back to the ER if:  Your symptoms worsen within the next 12-24 hours. You develop new symptoms such as new fevers, persistent vomiting, new pain, shortness of breath, or new weakness or numbness, or if you have any other concerns.  

## 2021-06-21 NOTE — ED Notes (Signed)
[facility] paged for ambulance transport home. [facility] confirmed placement on transport list.

## 2021-06-21 NOTE — ED Triage Notes (Signed)
Per EMS-Patient reports that he has been out of his psych meds x 1 week.. Patient called EMS to reports SI. Patient denies any plan.

## 2021-06-21 NOTE — Care Management (Signed)
ED RN CM attempted  to contact EDP concerning medication assistance.    Michel Bickers RN, BSN  ED Care Manager [phone]

## 2021-06-21 NOTE — ED Provider Notes (Signed)
Signed out to me is pending Child psychotherapist consult.  Patient denies any active suicidal plan.  He states he always feels depressed and this is not changed or acutely worse today.  Given refill of his psychiatric medications.  Advise continued outpatient psychiatric care.  Advised immediate return for worsening symptoms or any additional concerns.    Cheryll Cockayne, MD 06/21/21 [phone]

## 2021-06-21 NOTE — ED Provider Notes (Signed)
Emergency Medicine Provider Triage Evaluation Note  Cory Palmer , a 60 y.o. male  was evaluated in triage.  Pt complains of being out of his medications.  Per EMS he reported suicidal ideations.  Patient states that for 1 week he has not had his psychiatric medications because he "ran out of them."  He states that he takes sertraline, Seroquel and trazodone.  He states that "life sometimes gets overwhelming and there is a lot going on."  He states that he has not slept all week because he has not had his bipolar medication.  He states that he has passive suicidal ideation, "sometimes."  Denies plan.  Denies HI, AVH.  He does not appear to be responding to internal stimuli.  He laughs intermittently at questions.  Not actively psychotic.  He has no other complaints.  Review of Systems  Positive: See above  Negative:   Physical Exam  BP 128/83 (BP Location: Right Arm)   Pulse 77   Temp 98.8 F (37.1 C) (Oral)   Ht 6\' 4"  (1.93 m)   Wt 108.9 kg   SpO2 98%   BMI 29.21 kg/m  Gen:   Awake, no distress                 Resp:  Normal effort  MSK:   Moves extremities without difficulty  Other:    Medical Decision Making  Medically screening exam initiated at 3:58 PM.  Appropriate orders placed.  DUVAL MACLEOD was informed that the remainder of the evaluation will be completed by another provider, this initial triage assessment does not replace that evaluation, and the importance of remaining in the ED until their evaluation is complete.     [name], PA-C 06/21/21 1600    06/23/21, MD 06/22/21 [phone]

## 2021-06-21 NOTE — Progress Notes (Signed)
CSW spoke with Burna Mortimer, RN CM to request assistance with medications.  Edwin Dada, MSW, LCSW Transitions of Care  Clinical Social Worker II [phone]

## 2021-06-21 NOTE — ED Notes (Signed)
Pt changed into scrubs.  Pt has 2 bags of belongings in cabinet nurses' station 13-22 .  Pt has been seen and wand by security.

## 2021-06-21 NOTE — ED Provider Notes (Signed)
[facility] DEPT Provider Note   CSN: [id number] Arrival date & time: 06/21/21  1538     History Chief Complaint  Patient presents with   Suicidal    Cory Palmer is a 60 y.o. male.  HPI Patient presents feeling bad.  States he has been out of his psychiatric medicines for about a week or 2.  States due to his amputations he has difficulty getting his medications.  States he is out of sertraline and Seroquel and trazodone.  States he does have some suicidal thoughts.  States this is however chronic for him.  No active suicidal plan.  States he feels as if he had more of his medications done he would be able to manage.  States Cory Palmer his PCP writes for his psychiatric medicines.  States that his social worker was at his house today and told him to come into the ER for further social work help.    Past Medical History:  Diagnosis Date   Anxiety    Depressed bipolar disorder ([place])    Diabetes mellitus without complication ([place])    Hypertension    Osteomyelitis of left foot ([place]) 10/19/2019   Osteomyelitis of toe of left foot (HCC)    S/P transmetatarsal amputation of foot, left ([place]) 09/28/2018    Patient Active Problem List   Diagnosis Date Noted   Leukocytosis 10/10/2020   GERD (gastroesophageal reflux disease) 10/10/2020   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[place]) 05/06/2020   Hyponatremia 10/23/2019   MRSA bacteremia 10/21/2019   Atrial flutter with rapid ventricular response ([place]) 10/21/2019   Lactic acidosis 10/21/2019   Severe protein-calorie malnutrition ([place])    Diabetic polyneuropathy associated with type 2 diabetes mellitus ([place])    Cirrhosis of liver ([place]) 10/19/2019   Anemia 10/19/2019   Medication monitoring encounter 08/15/2019   Serum total bilirubin elevated 10/29/2018   BPH (benign prostatic hyperplasia) 10/29/2018   Below-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)    PAF (paroxysmal atrial fibrillation)  ([place])    Hypokalemia    Hypomagnesemia    Sepsis without acute organ dysfunction ([place]) 10/05/2018   Depressed bipolar disorder ([place]) 10/05/2018   Hypertension 10/05/2018   Hyperbilirubinemia 10/05/2018   Homelessness 05/07/2018   Diabetes mellitus type II, non insulin dependent ([place]) 05/07/2018    Past Surgical History:  Procedure Laterality Date   AMPUTATION Left 09/28/2018   Procedure: LEFT TRANSMETATARSAL AMPUTATION;  Surgeon: Newt Minion, MD;  Location: [facility];  Service: Orthopedics;  Laterality: Left;   AMPUTATION Left 10/23/2019   Procedure: AMPUTATION BELOW KNEE;  Surgeon: Newt Minion, MD;  Location: [facility];  Service: Orthopedics;  Laterality: Left;   BELOW KNEE LEG AMPUTATION Right    ESOPHAGOGASTRODUODENOSCOPY (EGD) WITH PROPOFOL N/A 10/19/2020   Procedure: ESOPHAGOGASTRODUODENOSCOPY (EGD) WITH PROPOFOL;  Surgeon: Juanita Craver, MD;  Location[facility] ENDOSCOPY;  Service: Endoscopy;  Laterality: N/A;   TEE WITHOUT CARDIOVERSION N/A 10/25/2019   Procedure: TRANSESOPHAGEAL ECHOCARDIOGRAM (TEE);  Surgeon: Lelon Perla, MD;  Location[facility]COPY;  Service: Cardiovascular;  Laterality: N/A;       Family History  Problem Relation Age of Onset   Hypertension Other    Diabetes Mellitus II Other     Social History   Tobacco Use   Smoking status: Every Day    Packs/day: 0.25    Types: Cigarettes   Smokeless tobacco: Never  Vaping Use   Vaping Use: Never used  Substance Use Topics   Alcohol use:  Yes   Drug use: Not Currently    Types: Cocaine    Home Medications Prior to Admission medications   Medication Sig Start Date End Date Taking? Authorizing Provider  Blood Glucose Monitoring Suppl (ACCU-CHEK AVIVA PLUS) w/Device KIT  05/23/19   [provider]  clindamycin (CLEOCIN) 300 MG capsule TAKE 1 CAPSULE (300 MG TOTAL) BY MOUTH EVERY EIGHT HOURS FOR 4 DAYS. 10/20/20 10/20/21  Shelly Coss, MD  ferrous sulfate 325 (65 FE) MG tablet Take 1 tablet (325 mg  total) by mouth daily with breakfast. Patient not taking: Reported on 10/10/2020 11/06/19   Dessa Phi, DO  fluconazole (DIFLUCAN) 100 MG tablet TAKE 1 TABLET (100 MG TOTAL) BY MOUTH DAILY FOR 6 DAYS. 10/20/20 10/20/21  Shelly Coss, MD  metFORMIN (GLUCOPHAGE) 1000 MG tablet Take 1 tablet (1,000 mg total) by mouth daily. 04/12/21   Palumbo, April, MD  Multiple Vitamin (MULTIVITAMIN WITH MINERALS) TABS tablet Take 1 tablet by mouth daily. Patient not taking: Reported on 10/10/2020 05/11/18   Edwin Dada, MD  pantoprazole (PROTONIX) 40 MG tablet TAKE 1 TABLET (40 MG TOTAL) BY MOUTH TWO TIMES DAILY. 04/12/21 05/12/21  Palumbo, April, MD  polyethylene glycol (MIRALAX / GLYCOLAX) 17 g packet Take 17 g by mouth daily as needed for moderate constipation. 10/20/20   Shelly Coss, MD  polyethylene glycol powder (GLYCOLAX/MIRALAX) 17 GM/SCOOP powder DISSOLVE 1 CAPFUL (17 G) IN WATER AND DRINK DAILY AS NEEDED FOR MODERATE CONSTIPATION. 10/20/20 10/20/21  Shelly Coss, MD  QUEtiapine (SEROQUEL) 300 MG tablet Take 1 tablet (300 mg total) by mouth at bedtime. 04/12/21   Palumbo, April, MD  sertraline (ZOLOFT) 50 MG tablet Take 1 tablet (50 mg total) by mouth daily. 01/28/21   Hayden Rasmussen, MD  sucralfate (CARAFATE) 1 GM/10ML suspension TAKE 10 MLS (1 G TOTAL) BY MOUTH FOUR TIMES DAILY - WITH MEALS AND AT BEDTIME FOR 14 DAYS. 10/20/20 10/20/21  Shelly Coss, MD  sucralfate (CARAFATE) 1 GM/10ML suspension Take 10 mLs (1 g total) by mouth 4 (four) times daily -  with meals and at bedtime for 14 days. 04/12/21 04/26/21  Palumbo, April, MD  traZODone (DESYREL) 150 MG tablet Take 1 tablet (150 mg total) by mouth at bedtime. 01/28/21   Hayden Rasmussen, MD    Allergies    Patient has no known allergies.  Review of Systems   Review of Systems  Constitutional:  Negative for appetite change.  HENT:  Negative for congestion.   Respiratory:  Negative for shortness of breath.   Cardiovascular:  Negative  for chest pain.  Gastrointestinal:  Negative for abdominal pain.  Genitourinary:  Negative for flank pain.  Musculoskeletal:  Negative for back pain.  Skin:  Negative for rash.  Neurological:  Positive for weakness.  Psychiatric/Behavioral:  Positive for suicidal ideas.    Physical Exam Updated Vital Signs BP 128/83 (BP Location: Right Arm)   Pulse 77   Temp 98.8 F (37.1 C) (Oral)   Ht _0  (1.93 m)   Wt 108.9 kg   SpO2 98%   BMI 29.21 kg/m   Physical Exam Vitals and nursing note reviewed.  Eyes:     General: No scleral icterus. Cardiovascular:     Rate and Rhythm: Regular rhythm.  Pulmonary:     Breath sounds: No wheezing.  Abdominal:     Tenderness: There is no abdominal tenderness.  Musculoskeletal:     Cervical back: Neck supple.     Comments: Bilateral below the knee amputations.  Skin:    General: Skin is warm.     Capillary Refill: Capillary refill takes less than 2 seconds.  Neurological:     Mental Status: He is alert and oriented to person, place, and time.    ED Results / Procedures / Treatments   Labs (all labs ordered are listed, but only abnormal results are displayed) Labs Reviewed  RESP PANEL BY RT-PCR (FLU A&B, COVID) ARPGX2  COMPREHENSIVE METABOLIC PANEL  ETHANOL  RAPID URINE DRUG SCREEN, HOSP PERFORMED  CBC WITH DIFFERENTIAL/PLATELET  ACETAMINOPHEN LEVEL  SALICYLATE LEVEL    EKG None  Radiology No results found.  Procedures Procedures   Medications Ordered in ED Medications - No data to display  ED Course  I have reviewed the triage vital signs and the nursing notes.  Pertinent labs & imaging results that were available during my care of the patient were reviewed by me and considered in my medical decision making (see chart for details).    MDM Rules/Calculators/A&P                           Patient presented reportedly suicidal.  However appears to be more of a social issue as opposed to a suicidal issue.  States that  the suicidality is constant.  States it is really unchanged.  States that he feels it would be managed better if he could get some help with the other parts of his life.  Unsure exactly what happened but states that social work sent him in for further help.  Sounds like he may be running out of a place to live soon.  Lab work pending and will need to be followed but otherwise appears to medically cleared.  Doubt he needs inpatient psychiatric treatment.  We will have patient see TOC.  Care turned over to Dr. Almyra Free. Final Clinical Impression(s) / ED Diagnoses Final diagnoses:  None    Rx / DC Orders ED Discharge Orders     None        Davonna Belling, MD 06/21/21 [phone]

## 2021-06-21 NOTE — Care Management (Signed)
ED RN Care Manager offered assistance with home delivery medications with patient through [facility], Patient is agreeable.  Discussed with Dr. Audley Hose who will e-scribe prescriptions.  Medications will be delivered tomorrow.

## 2021-07-08 ENCOUNTER — Emergency Department (HOSPITAL_COMMUNITY)
Admission: EM | Admit: 2021-07-08 | Discharge: 2021-07-09 | Disposition: A | Discharge status: Home / Self Care | Payer: Medicare Other | Attending: Emergency Medicine | Admitting: Emergency Medicine

## 2021-07-08 ENCOUNTER — Other Ambulatory Visit: Payer: Self-pay

## 2021-07-08 ENCOUNTER — Emergency Department (HOSPITAL_COMMUNITY): Payer: Medicare Other

## 2021-07-08 ENCOUNTER — Encounter (HOSPITAL_COMMUNITY): Payer: Self-pay

## 2021-07-08 DIAGNOSIS — R509 Fever, unspecified: Secondary | ICD-10-CM

## 2021-07-08 DIAGNOSIS — Z7984 Long term (current) use of oral hypoglycemic drugs: Secondary | ICD-10-CM | POA: Insufficient documentation

## 2021-07-08 DIAGNOSIS — Z20822 Contact with and (suspected) exposure to covid-19: Secondary | ICD-10-CM | POA: Insufficient documentation

## 2021-07-08 DIAGNOSIS — Z5900 Homelessness unspecified: Secondary | ICD-10-CM | POA: Diagnosis not present

## 2021-07-08 DIAGNOSIS — E1142 Type 2 diabetes mellitus with diabetic polyneuropathy: Secondary | ICD-10-CM | POA: Diagnosis not present

## 2021-07-08 DIAGNOSIS — M791 Myalgia, unspecified site: Secondary | ICD-10-CM | POA: Diagnosis not present

## 2021-07-08 DIAGNOSIS — I10 Essential (primary) hypertension: Secondary | ICD-10-CM | POA: Diagnosis not present

## 2021-07-08 DIAGNOSIS — R0602 Shortness of breath: Secondary | ICD-10-CM

## 2021-07-08 DIAGNOSIS — F17210 Nicotine dependence, cigarettes, uncomplicated: Secondary | ICD-10-CM | POA: Insufficient documentation

## 2021-07-08 LAB — CBC
HCT: 35.3 % — ABNORMAL LOW (ref 39.0–52.0)
Hemoglobin: 11.4 g/dL — ABNORMAL LOW (ref 13.0–17.0)
MCH: 28.8 pg (ref 26.0–34.0)
MCHC: 32.3 g/dL (ref 30.0–36.0)
MCV: 89.1 fL (ref 80.0–100.0)
Platelets: 82 10*3/uL — ABNORMAL LOW (ref 150–400)
RBC: 3.96 MIL/uL — ABNORMAL LOW (ref 4.22–5.81)
RDW: 18.6 % — ABNORMAL HIGH (ref 11.5–15.5)
WBC: 8.4 10*3/uL (ref 4.0–10.5)
nRBC: 0 % (ref 0.0–0.2)

## 2021-07-08 LAB — TROPONIN I (HIGH SENSITIVITY)
Troponin I (High Sensitivity): 6 ng/L (ref <18)
Troponin I (High Sensitivity): 6 ng/L (ref <18)

## 2021-07-08 LAB — RESP PANEL BY RT-PCR (FLU A&B, COVID) ARPGX2
Influenza A by PCR: NEGATIVE
Influenza B by PCR: NEGATIVE
SARS Coronavirus 2 by RT PCR: NEGATIVE

## 2021-07-08 LAB — BASIC METABOLIC PANEL
Anion gap: 8 (ref 5–15)
BUN: 8 mg/dL (ref 8–23)
CO2: 21 mmol/L — ABNORMAL LOW (ref 22–32)
Calcium: 9.3 mg/dL (ref 8.9–10.3)
Chloride: 107 mmol/L (ref 98–111)
Creatinine, Ser: 0.69 mg/dL (ref 0.61–1.24)
GFR, Estimated: >60 mL/min (ref >60)
Glucose, Bld: 128 mg/dL — ABNORMAL HIGH (ref 70–99)
Potassium: 3.4 mmol/L — ABNORMAL LOW (ref 3.5–5.1)
Sodium: 136 mmol/L (ref 135–145)

## 2021-07-08 MED ORDER — SODIUM CHLORIDE 0.9% FLUSH: 3.0000 mL | Freq: Once | INTRAVENOUS | Status: DC

## 2021-07-08 NOTE — ED Triage Notes (Signed)
Pt BIBA for fevers and generalized body aches that have been ongoing for the past two days. Patient is newly homeless and has not had access to his medications.

## 2021-07-08 NOTE — ED Provider Notes (Signed)
Emergency Medicine Provider Triage Evaluation Note  Cory Palmer , a 60 y.o. male  was evaluated in triage.  Pt complains of chest pain, myalgias, and fever.  Patient is homeless and has been sleeping in his wheelchair.  Also complains of wounds on his butt.  No nausea, vomiting, diarrhea.  Review of Systems  Positive:  Negative: See above   Physical Exam  BP 122/75 (BP Location: Right Arm)   Pulse 91   Temp 99.1 F (37.3 C) (Oral)   Resp 16   SpO2 100%  Gen:   Awake, no distress   Resp:  Normal effort  MSK:   Moves extremities without difficulty  Other:  Bilateral BKA.  Medical Decision Making  Medically screening exam initiated at 7:17 PM.  Appropriate orders placed.  RAZA BAYLESS was informed that the remainder of the evaluation will be completed by another provider, this initial triage assessment does not replace that evaluation, and the importance of remaining in the ED until their evaluation is complete.     Honor Loh [place], PA-C 07/08/21 1918    Linwood Dibbles, MD 07/09/21 [phone]

## 2021-07-09 ENCOUNTER — Encounter: Payer: Self-pay | Admitting: *Deleted

## 2021-07-09 NOTE — Progress Notes (Signed)
TOC consulted regarding pt needing wheelchair replaced as his is worn.  RNCM contacted DME to inquire about receiving a new wheelchair or replacing the one he has.  DME agency states that pt received current wheelchair in April of 2021 and is not eligible for another assistive walking device until April of 2026.  Pt also can not replace current wheelchair he would have to purchase another.

## 2021-07-09 NOTE — Progress Notes (Signed)
Patients brother provided him with a hotel for a week and an uber ride to the hotel. CSW contacted [facility] who [facility] beds but would have to meet with patient to do an assessment. CSW gave patient assisted living resources and shelters. CSW told patient to work on this so he will have a place to go after the week at the hotel.

## 2021-07-09 NOTE — ED Provider Notes (Signed)
[facility] EMERGENCY DEPARTMENT Provider Note  CSN: [id number] Arrival date & time: 07/08/21 1803  Chief Complaint(s) Fever and Homeless (/)  HPI Cory Palmer is a 60 y.o. male with a past medical history listed below including diabetes who presents to the emergency department with 2 to 3 weeks of subjective fevers and chills.  Worse over the last 2 days.  Endorsing associated chest discomfort over that timeframe.  Mild.  Constant.  Nonradiating.  No shortness of breath.  No coughing or congestion. no nausea or vomiting.  No diarrhea.  No abdominal pain.  Patient does report sacral soreness due to being wheelchair-bound.  Reports that he is newly homeless.  States that he has social work that gave him a phone that does not work.  Apparently they are working to get him placed into get a motorized wheelchair.   The history is provided by the patient.   Past Medical History Past Medical History:  Diagnosis Date   Anxiety    Depressed bipolar disorder ([place])    Diabetes mellitus without complication ([place])    Hypertension    Osteomyelitis of left foot ([place]) 10/19/2019   Osteomyelitis of toe of left foot (HCC)    S/P transmetatarsal amputation of foot, left ([place]) 09/28/2018   Patient Active Problem List   Diagnosis Date Noted   Leukocytosis 10/10/2020   GERD (gastroesophageal reflux disease) 10/10/2020   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[place]) 05/06/2020   Hyponatremia 10/23/2019   MRSA bacteremia 10/21/2019   Atrial flutter with rapid ventricular response ([place]) 10/21/2019   Lactic acidosis 10/21/2019   Severe protein-calorie malnutrition (HCC)    Diabetic polyneuropathy associated with type 2 diabetes mellitus ([place])    Cirrhosis of liver ([place]) 10/19/2019   Anemia 10/19/2019   Medication monitoring encounter 08/15/2019   Serum total bilirubin elevated 10/29/2018   BPH (benign prostatic hyperplasia) 10/29/2018   Below-knee amputation of right lower  extremity (HCC)    PAF (paroxysmal atrial fibrillation) ([place])    Hypokalemia    Hypomagnesemia    Sepsis without acute organ dysfunction ([place]) 10/05/2018   Depressed bipolar disorder ([place]) 10/05/2018   Hypertension 10/05/2018   Hyperbilirubinemia 10/05/2018   Homelessness 05/07/2018   Diabetes mellitus type II, non insulin dependent ([place]) 05/07/2018   Home Medication(s) Prior to Admission medications   Medication Sig Start Date End Date Taking? Authorizing Provider  bisacodyl (DULCOLAX) 5 MG EC tablet Take 5 mg by mouth daily as needed (constipation).    [provider]  Blood Glucose Monitoring Suppl (ACCU-CHEK AVIVA PLUS) w/Device KIT  05/23/19   [provider]  Docusate Calcium (STOOL SOFTENER PO) Take 1 capsule by mouth daily as needed (constipation).    [provider]  ferrous sulfate 325 (65 FE) MG tablet Take 1 tablet (325 mg total) by mouth daily with breakfast. Patient not taking: Reported on 10/10/2020 11/06/19   Dessa Phi, DO  metFORMIN (GLUCOPHAGE) 1000 MG tablet Take 1 tablet (1,000 mg total) by mouth 2 (two) times daily with a meal. 06/21/21 07/21/21  Luna Fuse, MD  Multiple Vitamin (MULTIVITAMIN WITH MINERALS) TABS tablet Take 1 tablet by mouth daily. 05/11/18   Danford, Suann Larry, MD  omeprazole (PRILOSEC) 20 MG capsule Take 20 mg by mouth daily.    [provider]  polyethylene glycol (MIRALAX / GLYCOLAX) 17 g packet Take 17 g by mouth daily as needed for moderate constipation. 10/20/20   Shelly Coss, MD  QUEtiapine (SEROQUEL) 300 MG tablet  Take 1 tablet (300 mg total) by mouth at bedtime. Patient not taking: Reported on 06/21/2021 04/12/21   Palumbo, April, MD  sertraline (ZOLOFT) 50 MG tablet Take 1 tablet (50 mg total) by mouth daily. 06/21/21   Luna Fuse, MD  sucralfate (CARAFATE) 1 g tablet Take 1 g by mouth 2 (two) times daily.    [provider]  traZODone (DESYREL) 150 MG tablet Take 1 tablet (150 mg  total) by mouth at bedtime. 06/21/21   Luna Fuse, MD                                                                                                                                    Past Surgical History Past Surgical History:  Procedure Laterality Date   AMPUTATION Left 09/28/2018   Procedure: LEFT TRANSMETATARSAL AMPUTATION;  Surgeon: Newt Minion, MD;  Location: [facility];  Service: Orthopedics;  Laterality: Left;   AMPUTATION Left 10/23/2019   Procedure: AMPUTATION BELOW KNEE;  Surgeon: Newt Minion, MD;  Location: [facility];  Service: Orthopedics;  Laterality: Left;   BELOW KNEE LEG AMPUTATION Right    ESOPHAGOGASTRODUODENOSCOPY (EGD) WITH PROPOFOL N/A 10/19/2020   Procedure: ESOPHAGOGASTRODUODENOSCOPY (EGD) WITH PROPOFOL;  Surgeon: Juanita Craver, MD;  [facility];  Service: Endoscopy;  Laterality: N/A;   TEE WITHOUT CARDIOVERSION N/A 10/25/2019   Procedure: TRANSESOPHAGEAL ECHOCARDIOGRAM (TEE);  Surgeon: Lelon Perla, MD;  Location: [facility] ENDOSCOPY;  Service: Cardiovascular;  Laterality: N/A;   Family History Family History  Problem Relation Age of Onset   Hypertension Other    Diabetes Mellitus II Other     Social History Social History   Tobacco Use   Smoking status: Every Day    Packs/day: 0.25    Types: Cigarettes   Smokeless tobacco: Never  Vaping Use   Vaping Use: Never used  Substance Use Topics   Alcohol use: Yes   Drug use: Not Currently    Types: Cocaine   Allergies Patient has no known allergies.  Review of Systems Review of Systems All other systems are reviewed and are negative for acute change except as noted in the HPI  Physical Exam Vital Signs  I have reviewed the triage vital signs BP (!) 113/97   Pulse 86   Temp 99.1 F (37.3 C) (Oral)   Resp 18   SpO2 97%   Physical Exam Vitals reviewed.  Constitutional:      General: He is not in acute distress.    Appearance: He is well-developed. He is not diaphoretic.  HENT:      Head: Normocephalic and atraumatic.     Nose: Nose normal.  Eyes:     General: No scleral icterus.       Right eye: No discharge.        Left eye: No discharge.     Conjunctiva/sclera: Conjunctivae normal.     Pupils: Pupils are equal, round,  and reactive to light.  Cardiovascular:     Rate and Rhythm: Normal rate and regular rhythm.     Heart sounds: No murmur heard.   No friction rub. No gallop.  Pulmonary:     Effort: Pulmonary effort is normal. No respiratory distress.     Breath sounds: Normal breath sounds. No stridor. No rales.  Abdominal:     General: There is no distension.     Palpations: Abdomen is soft.     Tenderness: There is no abdominal tenderness.  Musculoskeletal:        General: No tenderness.     Cervical back: Normal range of motion and neck supple.     Comments: Bilateral BKA. No open wounds. No erythema.  Skin:    General: Skin is warm and dry.     Findings: No erythema or rash.       Neurological:     Mental Status: He is alert and oriented to person, place, and time.    ED Results and Treatments Labs (all labs ordered are listed, but only abnormal results are displayed) Labs Reviewed  BASIC METABOLIC PANEL - Abnormal; Notable for the following components:      Result Value   Potassium 3.4 (*)    CO2 21 (*)    Glucose, Bld 128 (*)    All other components within normal limits  CBC - Abnormal; Notable for the following components:   RBC 3.96 (*)    Hemoglobin 11.4 (*)    HCT 35.3 (*)    RDW 18.6 (*)    Platelets 82 (*)    All other components within normal limits  RESP PANEL BY RT-PCR (FLU A&B, COVID) ARPGX2  TROPONIN I (HIGH SENSITIVITY)  TROPONIN I (HIGH SENSITIVITY)                                                                                                                         EKG  EKG Interpretation  Date/Time:  Thursday July 08 2021 19:29:29 EST Ventricular Rate:  92 PR Interval:    QRS Duration: 92 QT  Interval:  418 QTC Calculation: 516 R Axis:   48 Text Interpretation: Atrial fibrillation Nonspecific ST and T wave abnormality Prolonged QT Abnormal ECG When compared with ECG of 06/21/2021, QT has lengthened Confirmed by Delora Fuel ([id number]) on 07/08/2021 11:08:27 PM       Radiology DG Chest 1 View  Result Date: 07/08/2021 CLINICAL DATA:  Fever and body aches for the last 2 days EXAM: CHEST  1 VIEW COMPARISON:  04/11/2021 FINDINGS: Right costophrenic angle excluded. The lungs appear clear. Accounting for reverse lordotic AP projection, heart size is within normal limits. Moderate degenerative glenohumeral arthropathy on the left. IMPRESSION: 1.  No active cardiopulmonary disease is radiographically apparent. Electronically Signed   By: Van Clines M.D.   On: 07/08/2021 21:06    Pertinent labs & imaging results that were available during my care of the patient were reviewed by me  and considered in my medical decision making (see MDM for details).  Medications Ordered in ED Medications  sodium chloride flush (NS) 0.9 % injection 3 mL (has no administration in time range)                                                                                                                                     Procedures Procedures  (including critical care time)  Medical Decision Making / ED Course I have reviewed the nursing notes for this encounter and the patient's prior records (if available in EHR or on provided paperwork).  Cory Palmer was evaluated in Emergency Department on 07/09/2021 for the symptoms described in the history of present illness. He was evaluated in the context of the global COVID-19 pandemic, which necessitated consideration that the patient might be at risk for infection with the SARS-CoV-2 virus that causes COVID-19. Institutional protocols and algorithms that pertain to the evaluation of patients at risk for COVID-19 are in a state of rapid change based on  information released by regulatory bodies including the CDC and federal and state organizations. These policies and algorithms were followed during the patient's care in the ED.     Patient presents with subjective fevers and myalgia.  Adequate oral hydration. Rest of history as above.  Patient appears well. No signs of toxicity, patient is interactive. No hypoxia, tachypnea or other signs of respiratory distress. No sign of clinical dehydration. Lung exam clear. Rest of exam as above.   Pertinent labs & imaging results that were available during my care of the patient were reviewed by me and considered in my medical decision making:  No evidence of skin infection. CBC without leukocytosis or significant anemia. No significant electrolyte derangements or renal sufficiency. Chest x-ray without evidence of pneumonia. COVID/influenza negative.  Most consistent with viral illness   No evidence suggestive of pharyngitis, AOM, PNA, or meningitis.   Chest discomfort is highly atypical for ACS. EKG without acute ischemic changes or evidence of pericarditis. Serial troponins negative x2.  Feel this is sufficient to rule out ACS given the clinical picture. Doubt PE, dissection, esophageal perforation. On my read of the chest x-ray, there was no evidence suggestive of pneumonia, pneumothorax, pneumomediastinum, pulmonary edema concerning for new or exacerbation of heart failure, abnormal contour of the mediastinum to suggest dissection, and no evidence of acute injuries.    Consult to social work to assist in getting the patient plugged back in with the community Education officer, museum.   Final Clinical Impression(s) / ED Diagnoses Final diagnoses:  Myalgia  Subjective fever   The patient appears reasonably screened and/or stabilized for discharge and I doubt any other medical condition or other [facility] requiring further screening, evaluation, or treatment in the ED at this time prior to discharge.  Safe for discharge with strict return precautions.  Disposition: Discharge  Condition: Good  I have discussed the results, Dx  and Tx plan with the patient/family who expressed understanding and agree(s) with the plan. Discharge instructions discussed at length. The patient/family was given strict return precautions who verbalized understanding of the instructions. No further questions at time of discharge.    ED Discharge Orders     None        Follow Up: Hayden Rasmussen, MD [place]  [id number] [phone]  Call      This chart was dictated using voice recognition software.  Despite best efforts to proofread,  errors can occur which can change the documentation meaning.    Fatima Blank, MD 07/09/21 [phone]

## 2021-08-03 ENCOUNTER — Telehealth: Payer: Self-pay | Admitting: Orthopedic Surgery

## 2021-08-03 NOTE — Telephone Encounter (Signed)
FYI Patient's sister in [place] called advised patient is trying to get his prosthetic legs and can not get through to [facility]. Note: Patient gave verbal ok to speak with [facility].   Dr Sharol Given contacted [employer] and was advised there is an IT problem with the phone system that is being corrected.  I advised Margie of this.   The order was faxed over 08/24/2020 for patients prosthetic legs.  The number to contact  Patient is [phone] or [phone]

## 2021-08-12 ENCOUNTER — Encounter (HOSPITAL_COMMUNITY): Payer: Self-pay | Admitting: Oncology

## 2021-08-12 ENCOUNTER — Other Ambulatory Visit: Payer: Self-pay

## 2021-08-12 ENCOUNTER — Emergency Department (HOSPITAL_COMMUNITY)
Admission: EM | Admit: 2021-08-12 | Discharge: 2021-08-13 | Disposition: A | Discharge status: Home / Self Care | Payer: Medicare Other | Attending: Emergency Medicine | Admitting: Emergency Medicine

## 2021-08-12 DIAGNOSIS — F99 Mental disorder, not otherwise specified: Secondary | ICD-10-CM | POA: Diagnosis present

## 2021-08-12 DIAGNOSIS — E119 Type 2 diabetes mellitus without complications: Secondary | ICD-10-CM | POA: Diagnosis not present

## 2021-08-12 DIAGNOSIS — Z7984 Long term (current) use of oral hypoglycemic drugs: Secondary | ICD-10-CM | POA: Diagnosis not present

## 2021-08-12 DIAGNOSIS — Z5900 Homelessness unspecified: Secondary | ICD-10-CM | POA: Diagnosis not present

## 2021-08-12 DIAGNOSIS — R45851 Suicidal ideations: Secondary | ICD-10-CM | POA: Diagnosis not present

## 2021-08-12 DIAGNOSIS — R42 Dizziness and giddiness: Secondary | ICD-10-CM | POA: Insufficient documentation

## 2021-08-12 DIAGNOSIS — F4310 Post-traumatic stress disorder, unspecified: Secondary | ICD-10-CM | POA: Diagnosis not present

## 2021-08-12 DIAGNOSIS — F419 Anxiety disorder, unspecified: Secondary | ICD-10-CM | POA: Diagnosis not present

## 2021-08-12 DIAGNOSIS — Z59819 Housing instability, housed unspecified: Secondary | ICD-10-CM | POA: Diagnosis not present

## 2021-08-12 DIAGNOSIS — R112 Nausea with vomiting, unspecified: Secondary | ICD-10-CM | POA: Insufficient documentation

## 2021-08-12 DIAGNOSIS — Y900 Blood alcohol level of less than 20 mg/100 ml: Secondary | ICD-10-CM | POA: Diagnosis not present

## 2021-08-12 DIAGNOSIS — Z20822 Contact with and (suspected) exposure to covid-19: Secondary | ICD-10-CM | POA: Insufficient documentation

## 2021-08-12 DIAGNOSIS — F314 Bipolar disorder, current episode depressed, severe, without psychotic features: Secondary | ICD-10-CM | POA: Insufficient documentation

## 2021-08-12 LAB — CBC WITH DIFFERENTIAL/PLATELET
Abs Immature Granulocytes: 0.01 10*3/uL (ref 0.00–0.07)
Basophils Absolute: 0 10*3/uL (ref 0.0–0.1)
Basophils Relative: 0 %
Eosinophils Absolute: 0.3 10*3/uL (ref 0.0–0.5)
Eosinophils Relative: 7 %
HCT: 33.4 % — ABNORMAL LOW (ref 39.0–52.0)
Hemoglobin: 10.7 g/dL — ABNORMAL LOW (ref 13.0–17.0)
Immature Granulocytes: 0 %
Lymphocytes Relative: 19 %
Lymphs Abs: 1 10*3/uL (ref 0.7–4.0)
MCH: 29.2 pg (ref 26.0–34.0)
MCHC: 32 g/dL (ref 30.0–36.0)
MCV: 91 fL (ref 80.0–100.0)
Monocytes Absolute: 0.4 10*3/uL (ref 0.1–1.0)
Monocytes Relative: 8 %
Neutro Abs: 3.4 10*3/uL (ref 1.7–7.7)
Neutrophils Relative %: 66 %
Platelets: 87 10*3/uL — ABNORMAL LOW (ref 150–400)
RBC: 3.67 MIL/uL — ABNORMAL LOW (ref 4.22–5.81)
RDW: 17.9 % — ABNORMAL HIGH (ref 11.5–15.5)
WBC: 5.1 10*3/uL (ref 4.0–10.5)
nRBC: 0 % (ref 0.0–0.2)

## 2021-08-12 LAB — COMPREHENSIVE METABOLIC PANEL
ALT: 12 U/L (ref 0–44)
AST: 19 U/L (ref 15–41)
Albumin: 3.4 g/dL — ABNORMAL LOW (ref 3.5–5.0)
Alkaline Phosphatase: 95 U/L (ref 38–126)
Anion gap: 9 (ref 5–15)
BUN: 9 mg/dL (ref 8–23)
CO2: 20 mmol/L — ABNORMAL LOW (ref 22–32)
Calcium: 8.5 mg/dL — ABNORMAL LOW (ref 8.9–10.3)
Chloride: 107 mmol/L (ref 98–111)
Creatinine, Ser: 0.48 mg/dL — ABNORMAL LOW (ref 0.61–1.24)
GFR, Estimated: >60 mL/min (ref >60)
Glucose, Bld: 182 mg/dL — ABNORMAL HIGH (ref 70–99)
Potassium: 3.6 mmol/L (ref 3.5–5.1)
Sodium: 136 mmol/L (ref 135–145)
Total Bilirubin: 1.1 mg/dL (ref 0.3–1.2)
Total Protein: 6.2 g/dL — ABNORMAL LOW (ref 6.5–8.1)

## 2021-08-12 LAB — RAPID URINE DRUG SCREEN, HOSP PERFORMED
Amphetamines: NOT DETECTED
Barbiturates: NOT DETECTED
Benzodiazepines: NOT DETECTED
Cocaine: POSITIVE — AB
Opiates: NOT DETECTED
Tetrahydrocannabinol: NOT DETECTED

## 2021-08-12 LAB — RESP PANEL BY RT-PCR (FLU A&B, COVID) ARPGX2
Influenza A by PCR: NEGATIVE
Influenza B by PCR: NEGATIVE
SARS Coronavirus 2 by RT PCR: NEGATIVE

## 2021-08-12 LAB — ETHANOL: Alcohol, Ethyl (B): <10 mg/dL (ref <10)

## 2021-08-12 MED ORDER — METFORMIN HCL 500 MG PO TABS
1000.0000 mg | ORAL_TABLET | Freq: Two times a day (BID) | ORAL | Status: DC
  Administered 2021-08-13: 1000 mg via ORAL
  Filled 2021-08-12: qty 2

## 2021-08-12 MED ORDER — QUETIAPINE FUMARATE 300 MG PO TABS
300.0000 mg | ORAL_TABLET | Freq: Every day | ORAL | Status: DC
  Administered 2021-08-12: 300 mg via ORAL
  Filled 2021-08-12: qty 1

## 2021-08-12 MED ORDER — SERTRALINE HCL 50 MG PO TABS
50.0000 mg | ORAL_TABLET | Freq: Every day | ORAL | Status: DC
  Administered 2021-08-13: 50 mg via ORAL
  Filled 2021-08-12: qty 1

## 2021-08-12 MED ORDER — PANTOPRAZOLE SODIUM 40 MG PO TBEC
40.0000 mg | DELAYED_RELEASE_TABLET | Freq: Every day | ORAL | Status: DC
  Administered 2021-08-13: 40 mg via ORAL
  Filled 2021-08-12: qty 1

## 2021-08-12 MED ORDER — NICOTINE 21 MG/24HR TD PT24
21.0000 mg | MEDICATED_PATCH | Freq: Every day | TRANSDERMAL | Status: DC
  Administered 2021-08-13: 21 mg via TRANSDERMAL
  Filled 2021-08-12: qty 1

## 2021-08-12 MED ORDER — TRAZODONE HCL 50 MG PO TABS
150.0000 mg | ORAL_TABLET | Freq: Every day | ORAL | Status: DC
  Administered 2021-08-12: 150 mg via ORAL
  Filled 2021-08-12: qty 1

## 2021-08-12 MED ORDER — ACETAMINOPHEN 325 MG PO TABS: 650.0000 mg | ORAL_TABLET | ORAL | Status: DC | PRN

## 2021-08-12 NOTE — BH Assessment (Signed)
@  2041, requested patient's nurse Truddie Hidden, RN) to place the TTS machine in patient's room for his initial TTS assessment.

## 2021-08-12 NOTE — Progress Notes (Signed)
Pt on video with TTS

## 2021-08-12 NOTE — Progress Notes (Signed)
Pt very pleasant conversing with this NT talking about his life experiences and homlessness

## 2021-08-12 NOTE — ED Triage Notes (Signed)
Pt bib [employer] d/t SI.  Per [employer] pt had been kicked out of his motel room today, then called his act team and voiced SI.  Pt is a b/l BKA.

## 2021-08-12 NOTE — Progress Notes (Signed)
Pt moved from H to TCU. Pt belongings including wheelchair marked with labels, placed in [id number] TCU

## 2021-08-12 NOTE — ED Provider Notes (Signed)
[facility] DEPT Provider Note   CSN: [id number] Arrival date & time: 08/12/21  1322     History  Chief Complaint  Patient presents with   Suicidal    Cory Palmer is a 61 y.o. male.  He has a history of depression, diabetes, osteomyelitis status post bilateral BKA.  He said he just lost his residence and is now homeless.  He was trying to get help from his act team.  He has been feeling increasingly suicidal due to his living situation.  They recommended he come to the emergency department for evaluation.  Patient denies any medical complaints.  He has a plan to buy a gun to shoot himself.  He admits to tobacco, frequent alcohol use and crack cocaine.  The history is provided by the patient.  Mental Health Problem Presenting symptoms: suicidal thoughts   Patient accompanied by:  Law enforcement Degree of incapacity (severity):  Unable to specify Onset quality:  Gradual Timing:  Constant Progression:  Worsening Chronicity:  New Context: alcohol use, drug abuse and stressful life event   Relieved by:  Nothing Ineffective treatments:  Antidepressants Associated symptoms: no abdominal pain, no chest pain and no headaches   Risk factors: hx of mental illness       Home Medications Prior to Admission medications   Medication Sig Start Date End Date Taking? Authorizing Provider  bisacodyl (DULCOLAX) 5 MG EC tablet Take 5 mg by mouth daily as needed (constipation).    [provider]  Blood Glucose Monitoring Suppl (ACCU-CHEK AVIVA PLUS) w/Device KIT  05/23/19   [provider]  Docusate Calcium (STOOL SOFTENER PO) Take 1 capsule by mouth daily as needed (constipation).    [provider]  ferrous sulfate 325 (65 FE) MG tablet Take 1 tablet (325 mg total) by mouth daily with breakfast. Patient not taking: Reported on 10/10/2020 11/06/19   Dessa Phi, DO  metFORMIN (GLUCOPHAGE) 1000 MG tablet Take 1 tablet (1,000 mg total)  by mouth 2 (two) times daily with a meal. 06/21/21 07/21/21  Luna Fuse, MD  Multiple Vitamin (MULTIVITAMIN WITH MINERALS) TABS tablet Take 1 tablet by mouth daily. 05/11/18   Danford, Suann Larry, MD  omeprazole (PRILOSEC) 20 MG capsule Take 20 mg by mouth daily.    [provider]  polyethylene glycol (MIRALAX / GLYCOLAX) 17 g packet Take 17 g by mouth daily as needed for moderate constipation. 10/20/20   Shelly Coss, MD  QUEtiapine (SEROQUEL) 300 MG tablet Take 1 tablet (300 mg total) by mouth at bedtime. Patient not taking: Reported on 06/21/2021 04/12/21   Palumbo, April, MD  sertraline (ZOLOFT) 50 MG tablet Take 1 tablet (50 mg total) by mouth daily. 06/21/21   Luna Fuse, MD  sucralfate (CARAFATE) 1 g tablet Take 1 g by mouth 2 (two) times daily.    [provider]  traZODone (DESYREL) 150 MG tablet Take 1 tablet (150 mg total) by mouth at bedtime. 06/21/21   Luna Fuse, MD      Allergies    Patient has no known allergies.    Review of Systems   Review of Systems  Constitutional:  Negative for fever.  HENT:  Negative for sore throat.   Eyes:  Negative for visual disturbance.  Respiratory:  Negative for shortness of breath.   Cardiovascular:  Negative for chest pain.  Gastrointestinal:  Negative for abdominal pain.  Genitourinary:  Negative for dysuria.  Musculoskeletal:  Positive for gait problem.  Skin:  Negative for rash.  Neurological:  Negative for headaches.  Psychiatric/Behavioral:  Positive for suicidal ideas.    Physical Exam Updated Vital Signs BP 140/84 (BP Location: Left Arm)    Pulse 72    Temp 98.3 F (36.8 C) (Oral)    Resp 16    SpO2 96%  Physical Exam Vitals and nursing note reviewed.  Constitutional:      General: He is not in acute distress.    Appearance: Normal appearance. He is well-developed.  HENT:     Head: Normocephalic and atraumatic.  Eyes:     Conjunctiva/sclera: Conjunctivae normal.  Cardiovascular:      Rate and Rhythm: Normal rate and regular rhythm.     Heart sounds: No murmur heard. Pulmonary:     Effort: Pulmonary effort is normal. No respiratory distress.     Breath sounds: Normal breath sounds.  Abdominal:     Palpations: Abdomen is soft.     Tenderness: There is no abdominal tenderness.  Musculoskeletal:        General: No swelling.     Cervical back: Neck supple.     Comments: Bilateral BKA  Skin:    General: Skin is warm and dry.     Capillary Refill: Capillary refill takes less than 2 seconds.  Neurological:     General: No focal deficit present.     Mental Status: He is alert.    ED Results / Procedures / Treatments   Labs (all labs ordered are listed, but only abnormal results are displayed) Labs Reviewed  COMPREHENSIVE METABOLIC PANEL - Abnormal; Notable for the following components:      Result Value   CO2 20 (*)    Glucose, Bld 182 (*)    Creatinine, Ser 0.48 (*)    Calcium 8.5 (*)    Total Protein 6.2 (*)    Albumin 3.4 (*)    All other components within normal limits  RAPID URINE DRUG SCREEN, HOSP PERFORMED - Abnormal; Notable for the following components:   Cocaine POSITIVE (*)    All other components within normal limits  CBC WITH DIFFERENTIAL/PLATELET - Abnormal; Notable for the following components:   RBC 3.67 (*)    Hemoglobin 10.7 (*)    HCT 33.4 (*)    RDW 17.9 (*)    Platelets 87 (*)    All other components within normal limits  CBG MONITORING, ED - Abnormal; Notable for the following components:   Glucose-Capillary 148 (*)    All other components within normal limits  RESP PANEL BY RT-PCR (FLU A&B, COVID) ARPGX2  ETHANOL    EKG None  Radiology No results found.  Procedures Procedures    Medications Ordered in ED Medications  metFORMIN (GLUCOPHAGE) tablet 1,000 mg (1,000 mg Oral Given 08/13/21 0824)  pantoprazole (PROTONIX) EC tablet 40 mg (40 mg Oral Given 08/13/21 0900)  QUEtiapine (SEROQUEL) tablet 300 mg (300 mg Oral Given  08/12/21 2152)  sertraline (ZOLOFT) tablet 50 mg (50 mg Oral Given 08/13/21 0900)  traZODone (DESYREL) tablet 150 mg (150 mg Oral Given 08/12/21 2151)  acetaminophen (TYLENOL) tablet 650 mg (has no administration in time range)  nicotine (NICODERM CQ - dosed in mg/24 hours) patch 21 mg (21 mg Transdermal Patch Applied 08/13/21 0900)    ED Course/ Medical Decision Making/ A&P                           Medical Decision Making  This  patient complains of depression and suicidal; this involves an extensive number of treatment Options and is a complaint that carries with it a high risk of complications and Morbidity. The differential includes health crisis, suicidal ideation, overdose, social stressors  I ordered, reviewed and interpreted labs, which included CBC with normal white count, hemoglobin slightly lower than priors, chemistries with low bicarb possibly reflecting some dehydration, COVID and flu negative, urine tox positive for cocaine I ordered medication patient's home medication nicotine patch Additional history obtained from [place] who brought patient in Previous records obtained and reviewed in epic including prior ED visits I consulted TTS and discussed lab and imaging findings  Critical Interventions: None  After the interventions stated above, I reevaluated the patient and found patient to be cooperative.  He needs to be seen by mental health and social work for help with his situation.  Currently cooperative and voluntary.         Final Clinical Impression(s) / ED Diagnoses Final diagnoses:  Suicidal thoughts  Housing instability    Rx / DC Orders ED Discharge Orders     None         Hayden Rasmussen, MD 08/13/21 [phone]

## 2021-08-12 NOTE — Discharge Instructions (Addendum)
°  Discharge recommendations:  Patient is to take medications as prescribed. Please see information for follow-up appointment with psychiatry/therapy. Please follow up with your primary care provider for all medical related needs and needs for current home medications.   Therapy: We recommend that patient participate in individual therapy to address mental health concerns.  Medications: The patient is to contact a medical professional and/or outpatient provider to address any new side effects that develop. Patient should update outpatient providers of any new medications and/or medication changes.   Atypical antipsychotics: If you are prescribed an atypical antipsychotic, it is recommended that your height, weight, BMI, blood pressure, fasting lipid panel, and fasting blood sugar be monitored by your outpatient providers.  Safety:  The patient should abstain from use of illicit substances/drugs and abuse of any medications. If symptoms worsen or do not continue to improve or if the patient becomes actively suicidal or homicidal then it is recommended that the patient return to the closest hospital emergency department, the [facility], or call 911 for further evaluation and treatment. National Suicide Prevention Lifeline 1-800-SUICIDE or 803 236 5494.  About 988 988 offers 24/7 access to trained crisis counselors who can help people experiencing mental health-related distress. People can call or text 988 or chat 988lifeline.org for themselves or if they are worried about a loved one who may need crisis support.
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
## 2021-08-13 ENCOUNTER — Emergency Department (HOSPITAL_COMMUNITY): Payer: Medicare Other

## 2021-08-13 ENCOUNTER — Encounter (HOSPITAL_COMMUNITY): Payer: Self-pay

## 2021-08-13 ENCOUNTER — Emergency Department (HOSPITAL_COMMUNITY)
Admission: EM | Admit: 2021-08-13 | Discharge: 2021-08-14 | Disposition: A | Discharge status: Home / Self Care | Payer: Medicare Other | Source: Home / Self Care | Attending: Emergency Medicine | Admitting: Emergency Medicine

## 2021-08-13 ENCOUNTER — Other Ambulatory Visit: Payer: Self-pay

## 2021-08-13 DIAGNOSIS — R110 Nausea: Secondary | ICD-10-CM

## 2021-08-13 DIAGNOSIS — R42 Dizziness and giddiness: Secondary | ICD-10-CM | POA: Insufficient documentation

## 2021-08-13 DIAGNOSIS — R112 Nausea with vomiting, unspecified: Secondary | ICD-10-CM | POA: Insufficient documentation

## 2021-08-13 DIAGNOSIS — Z5900 Homelessness unspecified: Secondary | ICD-10-CM | POA: Insufficient documentation

## 2021-08-13 LAB — TROPONIN I (HIGH SENSITIVITY): Troponin I (High Sensitivity): 4 ng/L (ref <18)

## 2021-08-13 LAB — CBC WITH DIFFERENTIAL/PLATELET
Abs Immature Granulocytes: 0.02 10*3/uL (ref 0.00–0.07)
Basophils Absolute: 0 10*3/uL (ref 0.0–0.1)
Basophils Relative: 0 %
Eosinophils Absolute: 0.1 10*3/uL (ref 0.0–0.5)
Eosinophils Relative: 1 %
HCT: 35.9 % — ABNORMAL LOW (ref 39.0–52.0)
Hemoglobin: 11.8 g/dL — ABNORMAL LOW (ref 13.0–17.0)
Immature Granulocytes: 0 %
Lymphocytes Relative: 10 %
Lymphs Abs: 0.6 10*3/uL — ABNORMAL LOW (ref 0.7–4.0)
MCH: 29.4 pg (ref 26.0–34.0)
MCHC: 32.9 g/dL (ref 30.0–36.0)
MCV: 89.3 fL (ref 80.0–100.0)
Monocytes Absolute: 0.3 10*3/uL (ref 0.1–1.0)
Monocytes Relative: 6 %
Neutro Abs: 5 10*3/uL (ref 1.7–7.7)
Neutrophils Relative %: 83 %
Platelets: 96 10*3/uL — ABNORMAL LOW (ref 150–400)
RBC: 4.02 MIL/uL — ABNORMAL LOW (ref 4.22–5.81)
RDW: 18.1 % — ABNORMAL HIGH (ref 11.5–15.5)
WBC: 6 10*3/uL (ref 4.0–10.5)
nRBC: 0 % (ref 0.0–0.2)

## 2021-08-13 LAB — COMPREHENSIVE METABOLIC PANEL
ALT: 11 U/L (ref 0–44)
AST: 22 U/L (ref 15–41)
Albumin: 3.5 g/dL (ref 3.5–5.0)
Alkaline Phosphatase: 86 U/L (ref 38–126)
Anion gap: 11 (ref 5–15)
BUN: 19 mg/dL (ref 8–23)
CO2: 18 mmol/L — ABNORMAL LOW (ref 22–32)
Calcium: 8.7 mg/dL — ABNORMAL LOW (ref 8.9–10.3)
Chloride: 106 mmol/L (ref 98–111)
Creatinine, Ser: 0.81 mg/dL (ref 0.61–1.24)
GFR, Estimated: >60 mL/min (ref >60)
Glucose, Bld: 209 mg/dL — ABNORMAL HIGH (ref 70–99)
Potassium: 3.9 mmol/L (ref 3.5–5.1)
Sodium: 135 mmol/L (ref 135–145)
Total Bilirubin: 1.8 mg/dL — ABNORMAL HIGH (ref 0.3–1.2)
Total Protein: 6.6 g/dL (ref 6.5–8.1)

## 2021-08-13 LAB — CBG MONITORING, ED: Glucose-Capillary: 148 mg/dL — ABNORMAL HIGH (ref 70–99)

## 2021-08-13 NOTE — ED Provider Notes (Signed)
Cleared by psychiatry and discharge.   Virgina Norfolk, DO 08/13/21 [phone]

## 2021-08-13 NOTE — ED Notes (Signed)
Unable to get blood work °

## 2021-08-13 NOTE — ED Notes (Signed)
Slept well occasional cough noted remains stable

## 2021-08-13 NOTE — ED Notes (Signed)
Remains awake  behavior self controlled affect flat but brightens on approach and with interaction with others. Currently denies suicidal ideation.

## 2021-08-13 NOTE — ED Provider Triage Note (Addendum)
Emergency Medicine Provider Triage Evaluation Note  Cory Palmer , a 61 y.o. male  was evaluated in triage.  Pt complains of lightheadedness. He was just discharged today. States he had has diarrhea. He later mentioned chest pain and several other somatic complaints  Review of Systems  Positive: Lightheadedness, diarrhea Negative: constipation  Physical Exam  BP (!) 110/93 (BP Location: Right Arm)    Pulse (!) 117    Temp 99.3 F (37.4 C) (Oral)    Resp 18    SpO2 97% Comment: Simultaneous filing. User may not have seen previous data. Gen:   Awake, no distress   Resp:  Normal effort  MSK:   Moves extremities without difficulty   Medical Decision Making  Medically screening exam initiated at 2:52 PM.  Appropriate orders placed.  Cory Palmer was informed that the remainder of the evaluation will be completed by another provider, this initial triage assessment does not replace that evaluation, and the importance of remaining in the ED until their evaluation is complete.     Cory Meres, PA-C 08/13/21 1453    Cory Tullo S, PA-C 08/13/21 1455

## 2021-08-13 NOTE — Progress Notes (Signed)
TOC CSW has provided resources to this pt this morning prior to discharge. There are no available shelter beds at this time , pt can d/c back to [facility] and [facility] tonight.   Valentina Shaggy.Kimbra Marcelino, MSW, LCSWA [facility]   Transitions of Care Clinical Social Worker I Direct Dial: [phone]   Fax: [phone] [email or web address]

## 2021-08-13 NOTE — ED Triage Notes (Signed)
Per EMS- patient was taken to [facility] yesterday from this ED. Today, the patient c/o dizziness and emesis x 1 today. [id number] with EMS.

## 2021-08-13 NOTE — ED Notes (Signed)
Cory Palmer slept most of the night but has an intermittent deep non productive cough, denies pain. Cory Laur's TTS was completed and was psych cleared and ED provider A. Zenia Resides MD and Coralyn Helling MD were made aware no new orders obtained.

## 2021-08-13 NOTE — Progress Notes (Signed)
Shelter list is attached to pt's AVS. ° °Kaylana Fenstermacher M.Nickholas Goldston, MSW, LCSWA °[facility]   Transitions of Care °Clinical Social Worker I °Direct Dial: [phone]   Fax: [phone] °[email or web address]  °

## 2021-08-14 NOTE — Discharge Instructions (Addendum)
Follow-up with your regular doctor.  Come back to ER if you develop chest pain, abdominal pain, vomiting or other new concerning symptom.

## 2021-08-14 NOTE — ED Notes (Signed)
Pt states there is a bench outside [facility] that he can have cab pull beside and he can wait there. He has been instructed to ask someone to bring his w/c out once he arrives at facility. Verbalizes understanding the plan. Cab Voucher provided.

## 2021-08-14 NOTE — ED Provider Notes (Signed)
[facility] DEPT Provider Note   CSN: [id number] Arrival date & time: 08/13/21  1428     History  Chief Complaint  Patient presents with   Dizziness   Emesis    Cory Palmer is a 61 y.o. male.  Presented to the emergency room with concern for lightheadedness, nausea, vomiting.  Reports that he experienced symptoms yesterday afternoon.  However since being in the waiting room overnight all of his symptoms have resolved.  On the provider in triage note, there was mention of chest pain however patient denies any chest pain or difficulty breathing.  Currently denies any symptoms.  Patient reports he is currently homeless.  Completed review of chart, reviewed recent ER visits, social work notes, psychiatry notes.  Given resources for local shelters by TOC.  Cleared by psychiatry.  HPI     Home Medications Prior to Admission medications   Medication Sig Start Date End Date Taking? Authorizing Provider  ADVIL PM 200-25 MG CAPS Take 1 capsule by mouth at bedtime as needed (for sleep).    [provider]  Blood Glucose Monitoring Suppl (ACCU-CHEK AVIVA PLUS) w/Device KIT  05/23/19   [provider]  calcium elemental as carbonate (TUMS ULTRA 1000) 400 MG chewable tablet Chew 1,000-2,000 mg by mouth as needed for heartburn.    [provider]  docusate sodium (COLACE) 100 MG capsule Take 100 mg by mouth 2 (two) times daily as needed for mild constipation.    [provider]  ferrous sulfate 325 (65 FE) MG tablet Take 1 tablet (325 mg total) by mouth daily with breakfast. Patient not taking: Reported on 08/12/2021 11/06/19   Dessa Phi, DO  metFORMIN (GLUCOPHAGE) 1000 MG tablet Take 1 tablet (1,000 mg total) by mouth 2 (two) times daily with a meal. Patient taking differently: Take 1,000 mg by mouth daily with breakfast. 06/21/21 08/19/21  Luna Fuse, MD  Multiple Vitamin (MULTIVITAMIN WITH MINERALS) TABS tablet Take 1  tablet by mouth daily. 05/11/18   Danford, Suann Larry, MD  omeprazole (PRILOSEC) 20 MG capsule Take 20 mg by mouth daily as needed (for GERD-like symptoms).    [provider]  polyethylene glycol (MIRALAX / GLYCOLAX) 17 g packet Take 17 g by mouth daily as needed for moderate constipation. Patient taking differently: Take 17 g by mouth daily as needed for mild constipation or moderate constipation (mix as directed and drink). 10/20/20   Shelly Coss, MD  QUEtiapine (SEROQUEL) 300 MG tablet Take 1 tablet (300 mg total) by mouth at bedtime. Patient taking differently: Take 600 mg by mouth at bedtime. 04/12/21   Palumbo, April, MD  sertraline (ZOLOFT) 50 MG tablet Take 1 tablet (50 mg total) by mouth daily. 06/21/21   Luna Fuse, MD  sucralfate (CARAFATE) 1 g tablet Take 1 g by mouth 2 (two) times daily.    [provider]  traZODone (DESYREL) 150 MG tablet Take 1 tablet (150 mg total) by mouth at bedtime. Patient taking differently: Take 300 mg by mouth at bedtime. 06/21/21   Luna Fuse, MD  ZZZQUIL 25 MG CAPS Take 50 mg by mouth at bedtime as needed (for sleep).    [provider]      Allergies    Patient has no known allergies.    Review of Systems   Review of Systems  Constitutional:  Negative for chills and fever.  HENT:  Negative for ear pain and sore throat.   Eyes:  Negative  for pain and visual disturbance.  Respiratory:  Negative for cough and shortness of breath.   Cardiovascular:  Negative for chest pain and palpitations.  Gastrointestinal:  Negative for abdominal pain and vomiting.  Genitourinary:  Negative for dysuria and hematuria.  Musculoskeletal:  Negative for arthralgias and back pain.  Skin:  Negative for color change and rash.  Neurological:  Negative for seizures and syncope.  All other systems reviewed and are negative.  Physical Exam Updated Vital Signs BP 125/65 (BP Location: Right Arm)    Pulse 79    Temp 97.7 F (36.5 C)  (Oral)    Resp 18    Wt 108.9 kg    SpO2 100%    BMI 29.21 kg/m  Physical Exam Vitals and nursing note reviewed.  Constitutional:      General: He is not in acute distress.    Appearance: He is well-developed.  HENT:     Head: Normocephalic and atraumatic.  Eyes:     Conjunctiva/sclera: Conjunctivae normal.  Cardiovascular:     Rate and Rhythm: Normal rate and regular rhythm.     Pulses: Normal pulses.  Pulmonary:     Effort: Pulmonary effort is normal. No respiratory distress.  Abdominal:     Palpations: Abdomen is soft.     Tenderness: There is no abdominal tenderness.  Musculoskeletal:        General: No swelling.     Cervical back: Neck supple.     Comments: Status post bilateral bka  Skin:    General: Skin is warm and dry.     Capillary Refill: Capillary refill takes less than 2 seconds.  Neurological:     General: No focal deficit present.     Mental Status: He is alert.  Psychiatric:        Mood and Affect: Mood normal.    ED Results / Procedures / Treatments   Labs (all labs ordered are listed, but only abnormal results are displayed) Labs Reviewed  CBC WITH DIFFERENTIAL/PLATELET - Abnormal; Notable for the following components:      Result Value   RBC 4.02 (*)    Hemoglobin 11.8 (*)    HCT 35.9 (*)    RDW 18.1 (*)    Platelets 96 (*)    Lymphs Abs 0.6 (*)    All other components within normal limits  COMPREHENSIVE METABOLIC PANEL - Abnormal; Notable for the following components:   CO2 18 (*)    Glucose, Bld 209 (*)    Calcium 8.7 (*)    Total Bilirubin 1.8 (*)    All other components within normal limits  TROPONIN I (HIGH SENSITIVITY)  TROPONIN I (HIGH SENSITIVITY)    EKG EKG Interpretation  Date/Time:  Friday August 13 2021 16:40:43 EST Ventricular Rate:  81 PR Interval:  127 QRS Duration: 89 QT Interval:  367 QTC Calculation: 426 R Axis:   81 Text Interpretation: Sinus rhythm replaced afib flutter Borderline right axis deviation ST  elevation, consider anterior injury Otherwise no significant change Confirmed by Deno Etienne [phone]) on 08/14/2021 4:42:18 AM  Radiology DG Chest 2 View  Result Date: 08/13/2021 CLINICAL DATA:  Chest pain EXAM: CHEST - 2 VIEW COMPARISON:  None. FINDINGS: The heart size and mediastinal contours are within normal limits. Both lungs are clear. The visualized skeletal structures are unremarkable. IMPRESSION: No active cardiopulmonary disease. Electronically Signed   By: Kathreen Devoid M.D.   On: 08/13/2021 15:12    Procedures Procedures    Medications Ordered in  ED Medications - No data to display  ED Course/ Medical Decision Making/ A&P                           Medical Decision Making  61 year old gentleman presented to ER with concern for nausea and vomiting.  Patient seen in ER originally yesterday.  Basic lab work was obtained at that time and patient waited in waiting room overnight.  This morning he denies any acute complaints.  Vital signs are normal.  He appears well in no acute distress.  His abdomen is soft.  Basic lab work is grossly within normal limits.  Noted slight anemia and thrombocytopenia but appears similar to prior on review of past CBCs.  Denies chest pain or difficulty in breathing.  Chest x-ray negative per my review and per radiologist.  Given patient has no ongoing complaints, normal vital signs, feel that he can be discharged and managed in the outpatient setting.  Patient is currently homeless.  Social work evaluated patient yesterday and provided resources.  Will discharge.  After the discussed management above, the patient was determined to be safe for discharge.  The patient was in agreement with this plan and all questions regarding their care were answered.  ED return precautions were discussed and the patient will return to the ED with any significant worsening of condition.         Final Clinical Impression(s) / ED Diagnoses Final diagnoses:  Nausea   Homelessness    Rx / DC Orders ED Discharge Orders     None         Lucrezia Starch, MD 08/14/21 [phone]

## 2021-08-14 NOTE — ED Notes (Signed)
Pt has w/c at [facility], Probation officer has left messages for return call to have w/c outside so cab can transport back to [facility].

## 2021-09-13 ENCOUNTER — Emergency Department (HOSPITAL_COMMUNITY): Payer: Medicare Other

## 2021-09-13 ENCOUNTER — Encounter (HOSPITAL_COMMUNITY): Payer: Self-pay

## 2021-09-13 ENCOUNTER — Emergency Department (HOSPITAL_COMMUNITY)
Admission: EM | Admit: 2021-09-13 | Discharge: 2021-09-14 | Disposition: A | Discharge status: Home / Self Care | Payer: Medicare Other | Attending: Emergency Medicine | Admitting: Emergency Medicine

## 2021-09-13 DIAGNOSIS — I4891 Unspecified atrial fibrillation: Secondary | ICD-10-CM | POA: Insufficient documentation

## 2021-09-13 DIAGNOSIS — I10 Essential (primary) hypertension: Secondary | ICD-10-CM | POA: Diagnosis not present

## 2021-09-13 DIAGNOSIS — R1084 Generalized abdominal pain: Secondary | ICD-10-CM | POA: Insufficient documentation

## 2021-09-13 DIAGNOSIS — E119 Type 2 diabetes mellitus without complications: Secondary | ICD-10-CM | POA: Diagnosis not present

## 2021-09-13 DIAGNOSIS — R0602 Shortness of breath: Secondary | ICD-10-CM | POA: Diagnosis present

## 2021-09-13 DIAGNOSIS — Z7984 Long term (current) use of oral hypoglycemic drugs: Secondary | ICD-10-CM | POA: Insufficient documentation

## 2021-09-13 LAB — CBC
HCT: 39.3 % (ref 39.0–52.0)
Hemoglobin: 12.8 g/dL — ABNORMAL LOW (ref 13.0–17.0)
MCH: 29.7 pg (ref 26.0–34.0)
MCHC: 32.6 g/dL (ref 30.0–36.0)
MCV: 91.2 fL (ref 80.0–100.0)
Platelets: 96 10*3/uL — ABNORMAL LOW (ref 150–400)
RBC: 4.31 MIL/uL (ref 4.22–5.81)
RDW: 16.3 % — ABNORMAL HIGH (ref 11.5–15.5)
WBC: 8.8 10*3/uL (ref 4.0–10.5)
nRBC: 0 % (ref 0.0–0.2)

## 2021-09-13 LAB — BASIC METABOLIC PANEL
Anion gap: 8 (ref 5–15)
BUN: 11 mg/dL (ref 8–23)
CO2: 25 mmol/L (ref 22–32)
Calcium: 9.4 mg/dL (ref 8.9–10.3)
Chloride: 106 mmol/L (ref 98–111)
Creatinine, Ser: 0.63 mg/dL (ref 0.61–1.24)
GFR, Estimated: >60 mL/min (ref >60)
Glucose, Bld: 253 mg/dL — ABNORMAL HIGH (ref 70–99)
Potassium: 3.7 mmol/L (ref 3.5–5.1)
Sodium: 139 mmol/L (ref 135–145)

## 2021-09-13 LAB — TROPONIN I (HIGH SENSITIVITY): Troponin I (High Sensitivity): 3 ng/L (ref <18)

## 2021-09-13 LAB — LIPASE, BLOOD: Lipase: 28 U/L (ref 11–51)

## 2021-09-13 MED ORDER — SUCRALFATE 1 G PO TABS
1.0000 g | ORAL_TABLET | Freq: Two times a day (BID) | ORAL | Status: DC
  Administered 2021-09-13 – 2021-09-14 (×3): 1 g via ORAL
  Filled 2021-09-13 (×3): qty 1

## 2021-09-13 MED ORDER — PANTOPRAZOLE SODIUM 40 MG PO TBEC
40.0000 mg | DELAYED_RELEASE_TABLET | Freq: Every day | ORAL | Status: DC
  Administered 2021-09-13 – 2021-09-14 (×2): 40 mg via ORAL
  Filled 2021-09-13 (×2): qty 1

## 2021-09-13 MED ORDER — IOHEXOL 300 MG/ML  SOLN
100.0000 mL | Freq: Once | INTRAMUSCULAR | Status: AC | PRN
  Administered 2021-09-13: 100 mL via INTRAVENOUS

## 2021-09-13 MED ORDER — SERTRALINE HCL 50 MG PO TABS
50.0000 mg | ORAL_TABLET | Freq: Every day | ORAL | Status: DC
  Administered 2021-09-13: 50 mg via ORAL
  Filled 2021-09-13: qty 1

## 2021-09-13 MED ORDER — ONDANSETRON 8 MG PO TBDP
8.0000 mg | ORAL_TABLET | Freq: Once | ORAL | Status: AC
  Administered 2021-09-13: 8 mg via ORAL
  Filled 2021-09-13: qty 1

## 2021-09-13 MED ORDER — METFORMIN HCL 500 MG PO TABS
1000.0000 mg | ORAL_TABLET | Freq: Two times a day (BID) | ORAL | Status: DC
  Administered 2021-09-13: 1000 mg via ORAL
  Filled 2021-09-13: qty 2

## 2021-09-13 NOTE — Discharge Instructions (Addendum)
Evaluation today does not show any serious problems that require you to stay in the hospital.  Follow-up with your primary care doctor for further care and treatment of your pain and overall medical condition.  Try to take all of your medicines, every day as recommended.  Return here, if needed.

## 2021-09-13 NOTE — ED Triage Notes (Addendum)
Per EMS- Patient reported to EMS that he vomited 36 times since yesterday and was having abdominal pain as well. EMS reported that the patient had dry heaving when they arrived. EMS stated that the patient began having a sudden onset of chest pain and SOB when they were coming to triage this AM.  Patient states he is currently at [facility] and is waiting for housing.

## 2021-09-13 NOTE — ED Provider Notes (Signed)
Cory Palmer Provider Note   CSN: [id number] Arrival date & time: 09/13/21  0856     History  Chief Complaint  Patient presents with   Abdominal Pain   Emesis   Chest Pain   Shortness of Breath    Cory Palmer is a 61 y.o. male.  HPI He c/o intermittant diffuse abdominal pain, for several days.  He states he has vomited several times in the last couple of days.  He also complains of shortness of breath and chest pain today.  He is currently living at a shelter, the [facility].  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    Home Medications Prior to Admission medications   Medication Sig Start Date End Date Taking? Authorizing Provider  ADVIL PM 200-25 MG CAPS Take 1 capsule by mouth at bedtime as needed (for sleep).    [provider]  Blood Glucose Monitoring Suppl (ACCU-CHEK AVIVA PLUS) w/Device KIT  05/23/19   [provider]  calcium elemental as carbonate (TUMS ULTRA 1000) 400 MG chewable tablet Chew 1,000-2,000 mg by mouth as needed for heartburn.    [provider]  docusate sodium (COLACE) 100 MG capsule Take 100 mg by mouth 2 (two) times daily as needed for mild constipation.    [provider]  ferrous sulfate 325 (65 FE) MG tablet Take 1 tablet (325 mg total) by mouth daily with breakfast. Patient not taking: Reported on 08/12/2021 11/06/19   Dessa Phi, DO  metFORMIN (GLUCOPHAGE) 1000 MG tablet Take 1 tablet (1,000 mg total) by mouth 2 (two) times daily with a meal. Patient taking differently: Take 1,000 mg by mouth daily with breakfast. 06/21/21 08/19/21  Luna Fuse, MD  Multiple Vitamin (MULTIVITAMIN WITH MINERALS) TABS tablet Take 1 tablet by mouth daily. 05/11/18   Danford, Suann Larry, MD  omeprazole (PRILOSEC) 20 MG capsule Take 20 mg by mouth daily as needed (for GERD-like symptoms).    [provider]  polyethylene glycol (MIRALAX / GLYCOLAX) 17 g packet Take  17 g by mouth daily as needed for moderate constipation. Patient taking differently: Take 17 g by mouth daily as needed for mild constipation or moderate constipation (mix as directed and drink). 10/20/20   Shelly Coss, MD  QUEtiapine (SEROQUEL) 300 MG tablet Take 1 tablet (300 mg total) by mouth at bedtime. Patient taking differently: Take 600 mg by mouth at bedtime. 04/12/21   Palumbo, April, MD  sertraline (ZOLOFT) 50 MG tablet Take 1 tablet (50 mg total) by mouth daily. 06/21/21   Luna Fuse, MD  sucralfate (CARAFATE) 1 g tablet Take 1 g by mouth 2 (two) times daily.    [provider]  traZODone (DESYREL) 150 MG tablet Take 1 tablet (150 mg total) by mouth at bedtime. Patient taking differently: Take 300 mg by mouth at bedtime. 06/21/21   Luna Fuse, MD  ZZZQUIL 25 MG CAPS Take 50 mg by mouth at bedtime as needed (for sleep).    [provider]      Allergies    Patient has no known allergies.    Review of Systems   Review of Systems  Physical Exam Updated Vital Signs BP (!) 120/97    Pulse (!) 106    Temp 98.1 F (36.7 C) (Oral)    Resp (!) 21    Ht 6' 4"  (1.93 m)    Wt 99.8 kg    SpO2 94%    BMI  26.78 kg/m  Physical Exam Vitals and nursing note reviewed.  Constitutional:      General: He is not in acute distress.    Appearance: He is well-developed. He is obese. He is not ill-appearing or diaphoretic.  HENT:     Head: Normocephalic and atraumatic.     Right Ear: External ear normal.     Left Ear: External ear normal.  Eyes:     Conjunctiva/sclera: Conjunctivae normal.     Pupils: Pupils are equal, round, and reactive to light.  Neck:     Trachea: Phonation normal.  Cardiovascular:     Rate and Rhythm: Normal rate and regular rhythm.     Heart sounds: Normal heart sounds.  Pulmonary:     Effort: Pulmonary effort is normal.     Breath sounds: Normal breath sounds.  Abdominal:     Palpations: Abdomen is soft.     Tenderness: There is  abdominal tenderness (Diffuse, mild).  Musculoskeletal:        General: Normal range of motion.     Cervical back: Normal range of motion and neck supple.  Skin:    General: Skin is warm and dry.  Neurological:     Mental Status: He is alert and oriented to person, place, and time.     Cranial Nerves: No cranial nerve deficit.     Sensory: No sensory deficit.     Motor: No abnormal muscle tone.     Coordination: Coordination normal.  Psychiatric:        Mood and Affect: Mood normal.        Behavior: Behavior normal.        Thought Content: Thought content normal.        Judgment: Judgment normal.    ED Results / Procedures / Treatments   Labs (all labs ordered are listed, but only abnormal results are displayed) Labs Reviewed  BASIC METABOLIC PANEL - Abnormal; Notable for the following components:      Result Value   Glucose, Bld 253 (*)    All other components within normal limits  CBC - Abnormal; Notable for the following components:   Hemoglobin 12.8 (*)    RDW 16.3 (*)    Platelets 96 (*)    All other components within normal limits  LIPASE, BLOOD  TROPONIN I (HIGH SENSITIVITY)    EKG EKG Interpretation  Date/Time:  Monday September 13 2021 09:13:51 EST Ventricular Rate:  99 PR Interval:    QRS Duration: 96 QT Interval:  452 QTC Calculation: 581 R Axis:   57 Text Interpretation: Atrial fibrillation Borderline T abnormalities, anterior leads Prolonged QT interval Since last tracing now in Atrial fibrillation and QT has lengthened Otherwise no significant change Confirmed by Daleen Bo [phone]) on 09/13/2021 3:03:58 PM  Radiology DG Chest 2 View  Result Date: 09/13/2021 CLINICAL DATA:  Chest pain, shortness of breath EXAM: CHEST - 2 VIEW COMPARISON:  08/13/2021 FINDINGS: The heart size and mediastinal contours are within normal limits. Both lungs are clear. The visualized skeletal structures are unremarkable. IMPRESSION: No acute abnormality of the lungs.  Electronically Signed   By: Delanna Ahmadi M.D.   On: 09/13/2021 10:05   CT Abdomen Pelvis W Contrast  Result Date: 09/13/2021 CLINICAL DATA:  Nausea, vomiting, abdominal pain EXAM: CT ABDOMEN AND PELVIS WITH CONTRAST TECHNIQUE: Multidetector CT imaging of the abdomen and pelvis was performed using the standard protocol following bolus administration of intravenous contrast. RADIATION DOSE REDUCTION: This exam was performed according to  the departmental dose-optimization program which includes automated exposure control, adjustment of the mA and/or kV according to patient size and/or use of iterative reconstruction technique. CONTRAST:  161m OMNIPAQUE IOHEXOL 300 MG/ML  SOLN COMPARISON:  CT abdomen/pelvis 10/10/2020 FINDINGS: Lower chest: Imaged lung bases are clear. Coronary artery calcifications are noted. The imaged heart is otherwise unremarkable. Hepatobiliary: The liver is shrunken with a nodular surface contour consistent with cirrhosis. No focal lesions are seen on this single-phase study. The gallbladder is unremarkable. There is no biliary Pancreas: Ductal dilatation.  Unremarkable. Spleen: The spleen is enlarged measuring up to 18.7 cm cc. There are no focal lesions. Adrenals/Urinary Tract: The adrenals are unremarkable. There is a 1.8 cm right upper pole renal cyst, unchanged. A subcentimeter lesion in the right lower pole likely also reflects a small cyst. There are no other focal lesions. There are no stones. There is no hydronephrosis or hydroureter. There is symmetric excretion of contrast into the collecting systems on the delayed images. Stomach/Bowel: There is thickening of the lower esophageal wall, similar to the prior study from 10/10/2020. Stomach is unremarkable. There is no evidence of bowel obstruction. There is no abnormal bowel wall thickening or inflammatory change. Vascular/Lymphatic: There is scattered calcified atherosclerotic plaque in the nonaneurysmal abdominal aorta. The major  branch vessels are patent. The main portal and splenic veins are patent, prominent in caliber. Paraesophageal and upper abdominal varices are seen. There is recanalization of the umbilical vein which extends the lower anterior abdomen, overall unchanged. A 1.1 cm lymph node posterior to the GE junction is not significantly changed (2-28). There is no new or progressive lymphadenopathy in the abdomen or pelvis. Reproductive: The prostate and seminal vesicles are unremarkable. Other: There is no ascites or free air. Musculoskeletal: There is no acute osseous abnormality or aggressive osseous lesion. There is grade 1 anterolisthesis of L4 on L5 with associated pars defects, unchanged. IMPRESSION: 1. No acute findings in the abdomen or pelvis. 2. Cirrhotic morphology of the liver with imaging stigmata of portal hypertension as above. 3. Unchanged thickening of the lower esophageal wall which may reflect esophagitis, though malignancy again cannot be entirely excluded. Correlate with endoscopy as indicated. Aortic Atherosclerosis (ICD10-I70.0). Electronically Signed   By: PValetta MoleM.D.   On: 09/13/2021 17:33    Procedures Procedures    Medications Ordered in ED Medications  metFORMIN (GLUCOPHAGE) tablet 1,000 mg (1,000 mg Oral Given 09/13/21 1850)  pantoprazole (PROTONIX) EC tablet 40 mg (40 mg Oral Given 09/13/21 1529)  sertraline (ZOLOFT) tablet 50 mg (50 mg Oral Given 09/13/21 1529)  sucralfate (CARAFATE) tablet 1 g (1 g Oral Given 09/13/21 1529)  ondansetron (ZOFRAN-ODT) disintegrating tablet 8 mg (8 mg Oral Given 09/13/21 1529)  iohexol (OMNIPAQUE) 300 MG/ML solution 100 mL (100 mLs Intravenous Contrast Given 09/13/21 1659)    ED Course/ Medical Decision Making/ A&P Clinical Course as of 09/13/21 1926  Mon Sep 13, 2021  1915 At this time the patient is resting comfortably.  Heart rate 100-110 and appears to be atrial flutter on the monitor. [EW]    Clinical Course User Index [EW] WDaleen Bo  MD                           Medical Decision Making Patient presenting with nonspecific abdominal pain and vomiting.  He is currently homeless and living in a shelter.  Problems Addressed: Atrial fibrillation, unspecified type ([facility]: chronic illness or injury Generalized abdominal pain: acute  illness or injury    Details: Reportedly having both pain and vomiting, requiring advanced imaging.  Amount and/or Complexity of Data Reviewed Independent Historian:     Details: He gives a cogent history External Data Reviewed: notes.    Details: He is chronically ill with diabetes, hypertension, paroxysmal atrial fibrillation, malnutrition, neuropathy.  EGD done about 1 year ago showed esophageal reflux, and gastritis. Labs: ordered.    Details: CBC, metabolic panel, lipase, troponin Radiology: ordered and independent interpretation performed.    Details: Chest x-ray and CT abdomen pelvis-no acute abnormalities  Risk Prescription drug management. Decision regarding hospitalization. Diagnosis or treatment significantly limited by social determinants of health. Risk Details: Patient presenting with nonspecific symptoms and went a reassuring evaluation.  He has chronic paroxysmal atrial fibrillation and is minimally tachycardic.  He has been managed in the past by his PCP without anticoagulation.  No indication for initiation of anticoagulation at this time.  He was given his usual home medications and tolerated them well.  He does not have requirement for hospitalization at this time.              Final Clinical Impression(s) / ED Diagnoses Final diagnoses:  Generalized abdominal pain  Atrial fibrillation, unspecified type [facility])    Rx / DC Orders ED Discharge Orders     None         Daleen Bo, MD 09/13/21 1926

## 2021-09-14 ENCOUNTER — Other Ambulatory Visit (HOSPITAL_COMMUNITY): Payer: Self-pay

## 2021-09-14 MED ORDER — ONDANSETRON 4 MG PO TBDP
4.0000 mg | ORAL_TABLET | Freq: Three times a day (TID) | ORAL | 0 refills | Status: DC | PRN
  Filled 2021-09-14: qty 10, 4d supply, fill #0

## 2021-09-14 MED ORDER — ONDANSETRON 8 MG PO TBDP
8.0000 mg | ORAL_TABLET | Freq: Once | ORAL | Status: AC
  Administered 2021-09-14: 8 mg via ORAL
  Filled 2021-09-14: qty 1

## 2021-09-14 MED ORDER — PROMETHAZINE HCL 25 MG PO TABS
25.0000 mg | ORAL_TABLET | Freq: Four times a day (QID) | ORAL | 0 refills | Status: DC | PRN
  Filled 2021-09-14: qty 10, 3d supply, fill #0

## 2021-09-14 MED ORDER — ACETAMINOPHEN 325 MG PO TABS
650.0000 mg | ORAL_TABLET | Freq: Once | ORAL | Status: AC
  Administered 2021-09-14: 650 mg via ORAL
  Filled 2021-09-14: qty 2

## 2021-09-14 NOTE — ED Provider Notes (Signed)
Patient is awaiting discharge but because of nausea and vomiting, nurses concerned about him not having any prescriptions.  He has been prescribed Zofran and Phenergan now.   Pricilla Loveless, MD 09/14/21 [phone]

## 2021-09-14 NOTE — Progress Notes (Signed)
TOC CSW was consulted for transportation, pt is wheelchair-bound. CSW spoke with a pt who stated he left his wheelchair at the [facility]. CSW contacted the [facility] to verify pt's wheelchair is out front of the building.CSW inquired if pt had the money for a cab, pt stated he did not and is unable to use the bus due to not having his wheelchair.  CSW provided a cab voucher and informed pt he will need to bring his wheelchair so he can take the bus.   Valentina Shaggy.Karrissa Parchment, MSW, LCSWA [facility]   Transitions of Care Clinical Social Worker I Direct Dial: [phone]   Fax: [phone] [email or web address]

## 2021-10-13 ENCOUNTER — Encounter: Payer: Self-pay | Admitting: *Deleted

## 2021-10-13 NOTE — Congregational Nurse Program (Signed)
?  Dept: [phone] ? ? ?Congregational Nurse Program Note ? ?Date of Encounter: 10/13/2021 ? ?Past Medical History: ?Past Medical History:  ?Diagnosis Date  ? Anxiety   ? Depressed bipolar disorder (HCC)   ? Diabetes mellitus without complication (HCC)   ? Hypertension   ? Osteomyelitis of left foot (HCC) 10/19/2019  ? Osteomyelitis of toe of left foot (HCC)   ? S/P transmetatarsal amputation of foot, 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  ? ? ?Encounter Details: ? CNP Questionnaire - 10/13/21 1401   ? ?  ? Questionnaire  ? Do you give verbal consent to treat you today? Yes   ? Location Patient Served  [facility]  ? Visit [place] or [address]Phone/Text/Email   ? Patient Status Homeless   ? Insurance Medicare   ? Insurance Referral N/A   ? Medication N/A   ? Medical Provider Yes   ? Screening Referrals N/A   ? Medical Referral N/A   ? Food N/A   ? Transportation N/A   ? Housing/Utilities No permanent housing   ? Interpersonal Safety N/A   ? Intervention Support   ? ED Visit Averted N/A   ? Life-Saving Intervention Made N/A   ? ?  ?  ? ?  ? ?Client seen at [facility] through [facility]. Client was having difficulty getting his medication. Called [facility] and set up medication to be delivered to [facility] room 106 today between 1-5. ?Rilla Buckman W RN CN ? ? ?

## 2021-11-11 ENCOUNTER — Encounter (HOSPITAL_COMMUNITY): Payer: Self-pay | Admitting: Emergency Medicine

## 2021-11-11 ENCOUNTER — Emergency Department (HOSPITAL_COMMUNITY): Payer: Medicare Other

## 2021-11-11 ENCOUNTER — Other Ambulatory Visit: Payer: Self-pay

## 2021-11-11 ENCOUNTER — Inpatient Hospital Stay (HOSPITAL_COMMUNITY)
Admission: EM | Admit: 2021-11-11 | Discharge: 2021-11-18 | DRG: 441 | Disposition: A | Discharge status: Home Health | Payer: Medicare Other | Attending: Family Medicine | Admitting: Family Medicine

## 2021-11-11 DIAGNOSIS — S88111A Complete traumatic amputation at level between knee and ankle, right lower leg, initial encounter: Secondary | ICD-10-CM | POA: Diagnosis present

## 2021-11-11 DIAGNOSIS — Z6826 Body mass index (BMI) 26.0-26.9, adult: Secondary | ICD-10-CM

## 2021-11-11 DIAGNOSIS — F17210 Nicotine dependence, cigarettes, uncomplicated: Secondary | ICD-10-CM | POA: Diagnosis present

## 2021-11-11 DIAGNOSIS — Z79899 Other long term (current) drug therapy: Secondary | ICD-10-CM

## 2021-11-11 DIAGNOSIS — Z5900 Homelessness unspecified: Secondary | ICD-10-CM

## 2021-11-11 DIAGNOSIS — K209 Esophagitis, unspecified without bleeding: Secondary | ICD-10-CM

## 2021-11-11 DIAGNOSIS — K7682 Hepatic encephalopathy: Secondary | ICD-10-CM | POA: Diagnosis not present

## 2021-11-11 DIAGNOSIS — F1010 Alcohol abuse, uncomplicated: Secondary | ICD-10-CM | POA: Diagnosis present

## 2021-11-11 DIAGNOSIS — K2100 Gastro-esophageal reflux disease with esophagitis, without bleeding: Secondary | ICD-10-CM | POA: Diagnosis present

## 2021-11-11 DIAGNOSIS — Z833 Family history of diabetes mellitus: Secondary | ICD-10-CM

## 2021-11-11 DIAGNOSIS — G9340 Encephalopathy, unspecified: Secondary | ICD-10-CM | POA: Diagnosis not present

## 2021-11-11 DIAGNOSIS — E119 Type 2 diabetes mellitus without complications: Secondary | ICD-10-CM

## 2021-11-11 DIAGNOSIS — Z8249 Family history of ischemic heart disease and other diseases of the circulatory system: Secondary | ICD-10-CM

## 2021-11-11 DIAGNOSIS — E872 Acidosis, unspecified: Secondary | ICD-10-CM

## 2021-11-11 DIAGNOSIS — I480 Paroxysmal atrial fibrillation: Secondary | ICD-10-CM | POA: Diagnosis present

## 2021-11-11 DIAGNOSIS — R4182 Altered mental status, unspecified: Secondary | ICD-10-CM | POA: Diagnosis not present

## 2021-11-11 DIAGNOSIS — F3130 Bipolar disorder, current episode depressed, mild or moderate severity, unspecified: Secondary | ICD-10-CM | POA: Diagnosis present

## 2021-11-11 DIAGNOSIS — Z91148 Patient's other noncompliance with medication regimen for other reason: Secondary | ICD-10-CM

## 2021-11-11 DIAGNOSIS — G9341 Metabolic encephalopathy: Secondary | ICD-10-CM | POA: Diagnosis present

## 2021-11-11 DIAGNOSIS — E43 Unspecified severe protein-calorie malnutrition: Secondary | ICD-10-CM | POA: Diagnosis present

## 2021-11-11 DIAGNOSIS — Z89511 Acquired absence of right leg below knee: Secondary | ICD-10-CM

## 2021-11-11 DIAGNOSIS — K7460 Unspecified cirrhosis of liver: Secondary | ICD-10-CM | POA: Diagnosis present

## 2021-11-11 DIAGNOSIS — D696 Thrombocytopenia, unspecified: Secondary | ICD-10-CM | POA: Diagnosis present

## 2021-11-11 DIAGNOSIS — D6959 Other secondary thrombocytopenia: Secondary | ICD-10-CM | POA: Diagnosis present

## 2021-11-11 DIAGNOSIS — I10 Essential (primary) hypertension: Secondary | ICD-10-CM | POA: Diagnosis present

## 2021-11-11 DIAGNOSIS — K7030 Alcoholic cirrhosis of liver without ascites: Secondary | ICD-10-CM | POA: Diagnosis present

## 2021-11-11 DIAGNOSIS — S88112A Complete traumatic amputation at level between knee and ankle, left lower leg, initial encounter: Secondary | ICD-10-CM | POA: Diagnosis present

## 2021-11-11 DIAGNOSIS — Z9151 Personal history of suicidal behavior: Secondary | ICD-10-CM

## 2021-11-11 DIAGNOSIS — Z20822 Contact with and (suspected) exposure to covid-19: Secondary | ICD-10-CM | POA: Diagnosis present

## 2021-11-11 DIAGNOSIS — Z89512 Acquired absence of left leg below knee: Secondary | ICD-10-CM

## 2021-11-11 DIAGNOSIS — E1151 Type 2 diabetes mellitus with diabetic peripheral angiopathy without gangrene: Secondary | ICD-10-CM | POA: Diagnosis present

## 2021-11-11 DIAGNOSIS — K7040 Alcoholic hepatic failure without coma: Secondary | ICD-10-CM | POA: Diagnosis present

## 2021-11-11 DIAGNOSIS — I739 Peripheral vascular disease, unspecified: Secondary | ICD-10-CM

## 2021-11-11 DIAGNOSIS — K766 Portal hypertension: Secondary | ICD-10-CM | POA: Diagnosis present

## 2021-11-11 DIAGNOSIS — N400 Enlarged prostate without lower urinary tract symptoms: Secondary | ICD-10-CM | POA: Diagnosis present

## 2021-11-11 DIAGNOSIS — E876 Hypokalemia: Secondary | ICD-10-CM | POA: Diagnosis present

## 2021-11-11 DIAGNOSIS — F319 Bipolar disorder, unspecified: Secondary | ICD-10-CM | POA: Diagnosis present

## 2021-11-11 DIAGNOSIS — Z7984 Long term (current) use of oral hypoglycemic drugs: Secondary | ICD-10-CM

## 2021-11-11 HISTORY — DX: Paroxysmal atrial fibrillation: I48.0

## 2021-11-11 HISTORY — DX: Unspecified cirrhosis of liver: K74.60

## 2021-11-11 LAB — CBC WITH DIFFERENTIAL/PLATELET
Abs Immature Granulocytes: 0.01 10*3/uL (ref 0.00–0.07)
Basophils Absolute: 0 10*3/uL (ref 0.0–0.1)
Basophils Relative: 0 %
Eosinophils Absolute: 0.2 10*3/uL (ref 0.0–0.5)
Eosinophils Relative: 3 %
HCT: 37.2 % — ABNORMAL LOW (ref 39.0–52.0)
Hemoglobin: 12.4 g/dL — ABNORMAL LOW (ref 13.0–17.0)
Immature Granulocytes: 0 %
Lymphocytes Relative: 14 %
Lymphs Abs: 0.9 10*3/uL (ref 0.7–4.0)
MCH: 31.3 pg (ref 26.0–34.0)
MCHC: 33.3 g/dL (ref 30.0–36.0)
MCV: 93.9 fL (ref 80.0–100.0)
Monocytes Absolute: 0.7 10*3/uL (ref 0.1–1.0)
Monocytes Relative: 12 %
Neutro Abs: 4.4 10*3/uL (ref 1.7–7.7)
Neutrophils Relative %: 71 %
Platelets: 88 10*3/uL — ABNORMAL LOW (ref 150–400)
RBC: 3.96 MIL/uL — ABNORMAL LOW (ref 4.22–5.81)
RDW: 15.8 % — ABNORMAL HIGH (ref 11.5–15.5)
WBC: 6.1 10*3/uL (ref 4.0–10.5)
nRBC: 0 % (ref 0.0–0.2)

## 2021-11-11 LAB — COMPREHENSIVE METABOLIC PANEL
ALT: 21 U/L (ref 0–44)
AST: 29 U/L (ref 15–41)
Albumin: 4 g/dL (ref 3.5–5.0)
Alkaline Phosphatase: 104 U/L (ref 38–126)
Anion gap: 12 (ref 5–15)
BUN: 12 mg/dL (ref 8–23)
CO2: 15 mmol/L — ABNORMAL LOW (ref 22–32)
Calcium: 10 mg/dL (ref 8.9–10.3)
Chloride: 114 mmol/L — ABNORMAL HIGH (ref 98–111)
Creatinine, Ser: 0.94 mg/dL (ref 0.61–1.24)
GFR, Estimated: >60 mL/min (ref >60)
Glucose, Bld: 194 mg/dL — ABNORMAL HIGH (ref 70–99)
Potassium: 3.3 mmol/L — ABNORMAL LOW (ref 3.5–5.1)
Sodium: 141 mmol/L (ref 135–145)
Total Bilirubin: 1.5 mg/dL — ABNORMAL HIGH (ref 0.3–1.2)
Total Protein: 7.2 g/dL (ref 6.5–8.1)

## 2021-11-11 LAB — RESP PANEL BY RT-PCR (FLU A&B, COVID) ARPGX2
Influenza A by PCR: NEGATIVE
Influenza B by PCR: NEGATIVE
SARS Coronavirus 2 by RT PCR: NEGATIVE

## 2021-11-11 LAB — ETHANOL: Alcohol, Ethyl (B): <10 mg/dL (ref <10)

## 2021-11-11 LAB — TSH: TSH: 2.033 u[IU]/mL (ref 0.350–4.500)

## 2021-11-11 LAB — AMMONIA: Ammonia: 160 umol/L — ABNORMAL HIGH (ref 9–35)

## 2021-11-11 LAB — CK: Total CK: 48 U/L — ABNORMAL LOW (ref 49–397)

## 2021-11-11 MED ORDER — ONDANSETRON HCL 4 MG PO TABS: 4.0000 mg | ORAL_TABLET | Freq: Four times a day (QID) | ORAL | Status: DC | PRN

## 2021-11-11 MED ORDER — DILTIAZEM HCL-DEXTROSE 125-5 MG/125ML-% IV SOLN (PREMIX)
5.0000 mg/h | INTRAVENOUS | Status: DC
  Administered 2021-11-11: 5 mg/h via INTRAVENOUS
  Filled 2021-11-11: qty 125

## 2021-11-11 MED ORDER — LACTULOSE 10 GM/15ML PO SOLN
20.0000 g | Freq: Three times a day (TID) | ORAL | Status: DC
  Administered 2021-11-11 – 2021-11-15 (×13): 20 g via ORAL
  Filled 2021-11-11 (×15): qty 30

## 2021-11-11 MED ORDER — SODIUM CHLORIDE 0.9% FLUSH
3.0000 mL | Freq: Two times a day (BID) | INTRAVENOUS | Status: DC
  Administered 2021-11-12 – 2021-11-18 (×11): 3 mL via INTRAVENOUS

## 2021-11-11 MED ORDER — ONDANSETRON HCL 4 MG/2ML IJ SOLN: 4.0000 mg | Freq: Four times a day (QID) | INTRAMUSCULAR | Status: DC | PRN

## 2021-11-11 MED ORDER — LACTATED RINGERS IV SOLN
INTRAVENOUS | Status: AC
  Administered 2021-11-11: 125 mL/h via INTRAVENOUS

## 2021-11-11 MED ORDER — LACTATED RINGERS IV BOLUS
1000.0000 mL | Freq: Once | INTRAVENOUS | Status: AC
  Administered 2021-11-11: 1000 mL via INTRAVENOUS

## 2021-11-11 NOTE — H&P (Signed)
Initial vitals showed ?History and Physical  ? ? ?Cory Palmer YWV:[id number] DOB: 1961-03-25 DOA: 11/11/2021 ? ?PCP: Hayden Rasmussen, MD  ?Patient coming from: [facility] via EMS ? ?I have personally briefly reviewed patient's old medical records in [place] ? ?Chief Complaint: Altered mental status ? ?HPI: ?Cory Palmer is a 61 y.o. male with medical history significant for paroxysmal atrial fibrillation (not on anticoagulation due to chronic thrombocytopenia and nonadherence), hepatic cirrhosis with portal hypertension, type 2 diabetes, chronic thrombocytopenia, s/p bilateral BKA, bipolar disorder with depression and anxiety, homelessness who presented to the ED with EMS for evaluation of altered mental status after he was found down in his hotel room.  Patient is unable to provide any significant history due to encephalopathy and is otherwise supplemented by EDP, chart review. ? ?Patient reportedly found down on the floor in a motel room by security staff.  Unknown time that he was last seen well.  He is not able to provide any history regarding fall or current situation.  He does deny any alcohol use or illicit drug use when asked directly. ? ?ED Course  Labs/Imaging on admission: I have personally reviewed following labs and imaging studies. ? ?Initial vitals showed BP 136/85, pulse 122, RR 24, temp 98.5 ?F rectally, SPO2 100% on room air. ? ?Labs show WBC 6.1, hemoglobin 12.4, platelets 88,000, sodium 141, potassium 3.3, bicarb 15, BUN 12, creatinine 0.94, serum glucose 194, AST 29, ALT 21, alk phos 104, total bilirubin 1.5, TSH 2.033, CK 48, ammonia 160, serum ethanol <10. ? ?SARS-CoV-2 and influenza PCR negative.  Urinalysis and UDS pending collection. ? ?Portable chest x-ray shows low lung volumes without acute cardiopulmonary process. ? ?CT head without contrast negative for acute endocrine abnormality.  Generalized cerebral atrophy seen. ? ?Patient was given 1 L LR and the hospitalist service was  consulted to admit for further evaluation management. ? ?Review of Systems:  ?Unable to obtain full review of systems due to encephalopathy. ? ?Past Medical History:  ?Diagnosis Date  ? Anxiety   ? Depressed bipolar disorder ([place])   ? Diabetes mellitus without complication ([place])   ? Hypertension   ? Liver cirrhosis ([place])   ? Osteomyelitis of left foot ([place]) 10/19/2019  ? Osteomyelitis of toe of left foot ([place])   ? PAF (paroxysmal atrial fibrillation) ([place])   ? S/P transmetatarsal amputation of foot, left ([place]) 09/28/2018  ? ? ?Past Surgical History:  ?Procedure Laterality Date  ? AMPUTATION Left 09/28/2018  ? Procedure: LEFT TRANSMETATARSAL AMPUTATION;  Surgeon: Newt Minion, MD;  Location: [place];  Service: Orthopedics;  Laterality: Left;  ? AMPUTATION Left 10/23/2019  ? Procedure: AMPUTATION BELOW KNEE;  Surgeon: Newt Minion, MD;  Location: [place];  Service: Orthopedics;  Laterality: Left;  ? BELOW KNEE LEG AMPUTATION Right   ? ESOPHAGOGASTRODUODENOSCOPY (EGD) WITH PROPOFOL N/A 10/19/2020  ? Procedure: ESOPHAGOGASTRODUODENOSCOPY (EGD) WITH PROPOFOL;  Surgeon: Juanita Craver, MD;  Location: [facility] ENDOSCOPY;  Service: Endoscopy;  Laterality: N/A;  ? TEE WITHOUT CARDIOVERSION N/A 10/25/2019  ? Procedure: TRANSESOPHAGEAL ECHOCARDIOGRAM (TEE);  Surgeon: Lelon Perla, MD;  Location: [facility] ENDOSCOPY;  Service: Cardiovascular;  Laterality: N/A;  ? ? ?Social History: ? reports that he has been smoking cigarettes. He has been smoking an average of .25 packs per day. He has never used smokeless tobacco. He reports that he does not currently use alcohol. He reports that he does not currently use drugs after having used the following drugs: Cocaine. ? ?  No Known Allergies ? ?Family History  ?Problem Relation Age of Onset  ? Hypertension Other   ? Diabetes Mellitus II Other   ? ? ? ?Prior to Admission medications   ?Medication Sig Start Date End Date Taking? Authorizing Provider  ?ADVIL PM 200-25 MG CAPS Take 1 capsule by mouth at  bedtime as needed (for sleep).    [provider]  ?Blood Glucose Monitoring Suppl (ACCU-CHEK AVIVA PLUS) w/Device KIT  05/23/19   [provider]  ?calcium elemental as carbonate (TUMS ULTRA 1000) 400 MG chewable tablet Chew 1,000-2,000 mg by mouth as needed for heartburn.    [provider]  ?docusate sodium (COLACE) 100 MG capsule Take 100 mg by mouth 2 (two) times daily as needed for mild constipation.    [provider]  ?ferrous sulfate 325 (65 FE) MG tablet Take 1 tablet (325 mg total) by mouth daily with breakfast. ?Patient not taking: Reported on 08/12/2021 11/06/19   Dessa Phi, DO  ?metFORMIN (GLUCOPHAGE) 1000 MG tablet Take 1 tablet (1,000 mg total) by mouth 2 (two) times daily with a meal. ?Patient taking differently: Take 1,000 mg by mouth daily with breakfast. 06/21/21 08/19/21  Luna Fuse, MD  ?Multiple Vitamin (MULTIVITAMIN WITH MINERALS) TABS tablet Take 1 tablet by mouth daily. 05/11/18   Danford, Suann Larry, MD  ?omeprazole (PRILOSEC) 20 MG capsule Take 20 mg by mouth daily as needed (for GERD-like symptoms).    [provider]  ?ondansetron (ZOFRAN-ODT) 4 MG disintegrating tablet Dissolve 1 tablet by mouth every 8 hours as needed for nausea or vomiting. 09/14/21   Sherwood Gambler, MD  ?polyethylene glycol (MIRALAX / GLYCOLAX) 17 g packet Take 17 g by mouth daily as needed for moderate constipation. ?Patient taking differently: Take 17 g by mouth daily as needed for mild constipation or moderate constipation (mix as directed and drink). 10/20/20   Shelly Coss, MD  ?promethazine (PHENERGAN) 25 MG tablet Take 1 tablet by mouth every 6 hours as needed for nausea or vomiting. 09/14/21   Sherwood Gambler, MD  ?QUEtiapine (SEROQUEL) 300 MG tablet Take 1 tablet (300 mg total) by mouth at bedtime. ?Patient taking differently: Take 600 mg by mouth at bedtime. 04/12/21   Palumbo, April, MD  ?sertraline (ZOLOFT) 50 MG tablet Take 1 tablet (50 mg total) by  mouth daily. 06/21/21   Luna Fuse, MD  ?sucralfate (CARAFATE) 1 g tablet Take 1 g by mouth 2 (two) times daily.    [provider]  ?traZODone (DESYREL) 150 MG tablet Take 1 tablet (150 mg total) by mouth at bedtime. ?Patient taking differently: Take 300 mg by mouth at bedtime. 06/21/21   Luna Fuse, MD  ?ZZZQUIL 25 MG CAPS Take 50 mg by mouth at bedtime as needed (for sleep).    [provider]  ? ? ?Physical Exam: ?Vitals:  ? 11/11/21 2145 11/11/21 2200 11/11/21 2330 11/12/21 0000  ?BP: 110/88 118/82 122/79 (!) 135/99  ?Pulse: (!) 114  100 100  ?Resp: 18 18 17  (!) 39  ?Temp:      ?TempSrc:      ?SpO2: 99% 99% 99% 99%  ?Weight:      ?Height:      ? ?Exam limited due to encephalopathy ?Constitutional: Chronically ill-appearing man resting in bed with head slightly elevated, ?Eyes: PERRL, EOMI difficult to assess due to cooperation.  Lids and conjunctivae normal ?ENMT: Mucous membranes are dry. Posterior pharynx clear of any exudate or lesions. ?Neck: normal, supple, no  masses. ?Respiratory: clear to auscultation anteriorly. Normal respiratory effort. No accessory muscle use.  ?Cardiovascular: Irregularly irregular, no murmurs / rubs / gallops. No extremity edema. 2+ pedal pulses. ?Abdomen: no tenderness, no masses palpated. Bowel sounds positive.  ?Musculoskeletal: S/p bilateral BKA.  No clubbing / cyanosis. No joint deformity upper and lower extremities.   ?Skin: no rashes, lesions, ulcers. No induration ?Neurologic: Exam limited due to cooperation.  Moves both upper and left lower extremities easily on command, moves right lower extremity only after aggressive encouragement.  Strength appears equal.  Largely rightward gaze. ?Psychiatric: Awake, following commands limitedly.  Not oriented to self, place, year, situation. ? ?EKG: Personally reviewed. Atrial fibrillation, rate 130, no acute ischemic changes. ? ?Assessment/Plan ?Principal Problem: ?  Acute encephalopathy ?Active Problems: ?   Cirrhosis of liver ([place]) ?  PAF (paroxysmal atrial fibrillation) ([place]) ?  Diabetes mellitus type II, non insulin dependent ([place]) ?  Thrombocytopenia ([place]) ?  Depressed bipolar disorder ([place]) ?  ?Abbott Pao

## 2021-11-11 NOTE — ED Provider Notes (Addendum)
?[facility] ?Provider Note ? ? ?CSN: [id number] ?Arrival date & time: 11/11/21  1948 ? ?  ? ?History ? ?Chief Complaint  ?Patient presents with  ? Altered Mental Status  ? ? ?Cory Palmer is a 61 y.o. male. ? ?61 year old male who presents after being found on the ground by hotel staff.  Patient has a history of bilateral below-knee amputations.  He was found to be confused.  He denies any use of alcohol or illicit drugs.  Blood sugar was 188.  Patient denies any head trauma, neck, chest or abdominal discomfort.  Does have a strong smell of urine.  Transported by EMS ? ? ?  ? ?Home Medications ?Prior to Admission medications   ?Medication Sig Start Date End Date Taking? Authorizing Provider  ?ADVIL PM 200-25 MG CAPS Take 1 capsule by mouth at bedtime as needed (for sleep).    [provider]  ?Blood Glucose Monitoring Suppl (ACCU-CHEK AVIVA PLUS) w/Device KIT  05/23/19   [provider]  ?calcium elemental as carbonate (TUMS ULTRA 1000) 400 MG chewable tablet Chew 1,000-2,000 mg by mouth as needed for heartburn.    [provider]  ?docusate sodium (COLACE) 100 MG capsule Take 100 mg by mouth 2 (two) times daily as needed for mild constipation.    [provider]  ?ferrous sulfate 325 (65 FE) MG tablet Take 1 tablet (325 mg total) by mouth daily with breakfast. ?Patient not taking: Reported on 08/12/2021 11/06/19   Dessa Phi, DO  ?metFORMIN (GLUCOPHAGE) 1000 MG tablet Take 1 tablet (1,000 mg total) by mouth 2 (two) times daily with a meal. ?Patient taking differently: Take 1,000 mg by mouth daily with breakfast. 06/21/21 08/19/21  Luna Fuse, MD  ?Multiple Vitamin (MULTIVITAMIN WITH MINERALS) TABS tablet Take 1 tablet by mouth daily. 05/11/18   Danford, Suann Larry, MD  ?omeprazole (PRILOSEC) 20 MG capsule Take 20 mg by mouth daily as needed (for GERD-like symptoms).    [provider]  ?ondansetron (ZOFRAN-ODT) 4 MG  disintegrating tablet Dissolve 1 tablet by mouth every 8 hours as needed for nausea or vomiting. 09/14/21   Sherwood Gambler, MD  ?polyethylene glycol (MIRALAX / GLYCOLAX) 17 g packet Take 17 g by mouth daily as needed for moderate constipation. ?Patient taking differently: Take 17 g by mouth daily as needed for mild constipation or moderate constipation (mix as directed and drink). 10/20/20   Shelly Coss, MD  ?promethazine (PHENERGAN) 25 MG tablet Take 1 tablet by mouth every 6 hours as needed for nausea or vomiting. 09/14/21   Sherwood Gambler, MD  ?QUEtiapine (SEROQUEL) 300 MG tablet Take 1 tablet (300 mg total) by mouth at bedtime. ?Patient taking differently: Take 600 mg by mouth at bedtime. 04/12/21   Palumbo, April, MD  ?sertraline (ZOLOFT) 50 MG tablet Take 1 tablet (50 mg total) by mouth daily. 06/21/21   Luna Fuse, MD  ?sucralfate (CARAFATE) 1 g tablet Take 1 g by mouth 2 (two) times daily.    [provider]  ?traZODone (DESYREL) 150 MG tablet Take 1 tablet (150 mg total) by mouth at bedtime. ?Patient taking differently: Take 300 mg by mouth at bedtime. 06/21/21   Luna Fuse, MD  ?ZZZQUIL 25 MG CAPS Take 50 mg by mouth at bedtime as needed (for sleep).    [provider]  ?   ? ?Allergies    ?Patient has no known allergies.   ? ?Review of Systems   ?Review  of Systems  ?Unable to perform ROS: Acuity of condition  ? ?Physical Exam ?Updated Vital Signs ?Ht 1.93 m (6' 4" )   Wt 99.8 kg   BMI 26.78 kg/m?  ?Physical Exam ?Vitals and nursing note reviewed.  ?Constitutional:   ?   General: He is not in acute distress. ?   Appearance: Normal appearance. He is well-developed. He is not toxic-appearing.  ?HENT:  ?   Head: Normocephalic and atraumatic.  ?Eyes:  ?   General: Lids are normal.  ?   Conjunctiva/sclera: Conjunctivae normal.  ?   Pupils: Pupils are equal, round, and reactive to light.  ?Neck:  ?   Thyroid: No thyroid mass.  ?   Trachea: No tracheal deviation.  ?Cardiovascular:  ?    Rate and Rhythm: Normal rate and regular rhythm.  ?   Heart sounds: Normal heart sounds. No murmur heard. ?  No gallop.  ?Pulmonary:  ?   Effort: Pulmonary effort is normal. No respiratory distress.  ?   Breath sounds: Normal breath sounds. No stridor. No decreased breath sounds, wheezing, rhonchi or rales.  ?Abdominal:  ?   General: There is no distension.  ?   Palpations: Abdomen is soft.  ?   Tenderness: There is no abdominal tenderness. There is no rebound.  ?Musculoskeletal:     ?   General: No tenderness. Normal range of motion.  ?   Cervical back: Normal range of motion and neck supple.  ?Skin: ?   General: Skin is warm and dry.  ?   Findings: No abrasion or rash.  ?Neurological:  ?   Mental Status: He is alert and oriented to person, place, and time.  ?   GCS: GCS eye subscore is 4. GCS verbal subscore is 5. GCS motor subscore is 6.  ?   Cranial Nerves: No cranial nerve deficit.  ?   Sensory: No sensory deficit.  ?   Motor: Motor function is intact.  ?   Comments: Strength is 5 of 5 in bilateral upper extremities.  No facial droop appreciated.  No dysarthria noted.  ?Psychiatric:     ?   Attention and Perception: Attention normal.     ?   Speech: Speech is delayed.     ?   Behavior: Behavior is slowed.  ? ?ED Results / Procedures / Treatments   ?Labs ?(all labs ordered are listed, but only abnormal results are displayed) ?Labs Reviewed  ?RESP PANEL BY RT-PCR (FLU A&B, COVID) [id number]  ?ETHANOL  ?CBC WITH DIFFERENTIAL/PLATELET  ?COMPREHENSIVE METABOLIC PANEL  ?URINALYSIS, ROUTINE W REFLEX MICROSCOPIC  ?TSH  ? ? ?EKG ?EKG Interpretation ? ?Date/Time:  Thursday November 11 2021 20:09:10 EDT ?Ventricular Rate:  130 ?PR Interval:    ?QRS Duration: 111 ?QT Interval:  348 ?QTC Calculation: 492 ?R Axis:   -18 ?Text Interpretation: Atrial fibrillation Ventricular premature complex Borderline left axis deviation Borderline T wave abnormalities Borderline prolonged QT interval Confirmed by Lacretia Leigh ([id number]) on  11/11/2021 8:10:20 PM ? ?Radiology ?No results found. ? ?Procedures ?Procedures  ? ? ?Medications Ordered in ED ?Medications  ?lactated ringers infusion (has no administration in time range)  ?diltiazem (CARDIZEM) 125 mg in dextrose 5% 125 mL (1 mg/mL) infusion (has no administration in time range)  ? ? ?ED Course/ Medical Decision Making/ A&P ?  ?                        ?Medical Decision Making ?Amount and/or Complexity  of Data Reviewed ?Labs: ordered. ?Radiology: ordered. ?ECG/medicine tests: ordered. ? ?Risk ?Prescription drug management. ? ? ?Patient is EKG per my interpretation shows A-fib with rapid ventricular rate response.  Patient has no prior history of A-fib.  He was started on Cardizem drip and had good response to this.  His rate has slowed down.  His alcohol level is normal.  Head CT per my interpretation shows no acute abnormalities.  Chest x-ray per my interpretation shows no acute pulmonary process.  Patient is afebrile here and doubt septic process.  Tsh  was normal.  CK normal therefore do not think he has rhabdo from his fall.  Patient will require admission for new onset A-fib.  Although he has no focal weakness in his extremities, he likely will require an MRI as well 2.  Will consult hospitalist for admission ? ? ?10:32 PM ?Discussed with Dr. Posey Pronto from the hospitalist service.  He reports that patient does have a history of paroxysmal A-fib but was felt not to be an anticoagulation candidate.  He has requested that I discontinue the Cardizem drip at this time.  He will come to consult and assess ? ?CRITICAL CARE ?Performed by: Leota Jacobsen ?Total critical care time: 50 minutes ?Critical care time was exclusive of separately billable procedures and treating other patients. ?Critical care was necessary to treat or prevent imminent or life-threatening deterioration. ?Critical care was time spent personally by me on the following activities: development of treatment plan with patient and/or  surrogate as well as nursing, discussions with consultants, evaluation of patient's response to treatment, examination of patient, obtaining history from patient or surrogate, ordering and performing treatments and inte

## 2021-11-11 NOTE — ED Triage Notes (Signed)
Pt arrive by EMS from a motel after been found by Security staff on the floor, unable to know LSW. Pt is AO to self during triage. ?

## 2021-11-12 ENCOUNTER — Observation Stay (HOSPITAL_COMMUNITY): Payer: Medicare Other

## 2021-11-12 DIAGNOSIS — K7040 Alcoholic hepatic failure without coma: Secondary | ICD-10-CM | POA: Diagnosis present

## 2021-11-12 DIAGNOSIS — Z5900 Homelessness unspecified: Secondary | ICD-10-CM | POA: Diagnosis not present

## 2021-11-12 DIAGNOSIS — Z7984 Long term (current) use of oral hypoglycemic drugs: Secondary | ICD-10-CM | POA: Diagnosis not present

## 2021-11-12 DIAGNOSIS — Z9151 Personal history of suicidal behavior: Secondary | ICD-10-CM | POA: Diagnosis not present

## 2021-11-12 DIAGNOSIS — I480 Paroxysmal atrial fibrillation: Secondary | ICD-10-CM | POA: Diagnosis present

## 2021-11-12 DIAGNOSIS — S88112A Complete traumatic amputation at level between knee and ankle, left lower leg, initial encounter: Secondary | ICD-10-CM | POA: Diagnosis not present

## 2021-11-12 DIAGNOSIS — E1151 Type 2 diabetes mellitus with diabetic peripheral angiopathy without gangrene: Secondary | ICD-10-CM | POA: Diagnosis present

## 2021-11-12 DIAGNOSIS — R4182 Altered mental status, unspecified: Secondary | ICD-10-CM | POA: Diagnosis present

## 2021-11-12 DIAGNOSIS — D6959 Other secondary thrombocytopenia: Secondary | ICD-10-CM | POA: Diagnosis present

## 2021-11-12 DIAGNOSIS — K766 Portal hypertension: Secondary | ICD-10-CM | POA: Diagnosis present

## 2021-11-12 DIAGNOSIS — F17210 Nicotine dependence, cigarettes, uncomplicated: Secondary | ICD-10-CM | POA: Diagnosis present

## 2021-11-12 DIAGNOSIS — E43 Unspecified severe protein-calorie malnutrition: Secondary | ICD-10-CM | POA: Diagnosis present

## 2021-11-12 DIAGNOSIS — F1010 Alcohol abuse, uncomplicated: Secondary | ICD-10-CM | POA: Diagnosis present

## 2021-11-12 DIAGNOSIS — Z79899 Other long term (current) drug therapy: Secondary | ICD-10-CM | POA: Diagnosis not present

## 2021-11-12 DIAGNOSIS — G9341 Metabolic encephalopathy: Secondary | ICD-10-CM | POA: Diagnosis not present

## 2021-11-12 DIAGNOSIS — N400 Enlarged prostate without lower urinary tract symptoms: Secondary | ICD-10-CM | POA: Diagnosis present

## 2021-11-12 DIAGNOSIS — G9340 Encephalopathy, unspecified: Secondary | ICD-10-CM | POA: Diagnosis not present

## 2021-11-12 DIAGNOSIS — Z89511 Acquired absence of right leg below knee: Secondary | ICD-10-CM | POA: Diagnosis not present

## 2021-11-12 DIAGNOSIS — E872 Acidosis, unspecified: Secondary | ICD-10-CM | POA: Diagnosis present

## 2021-11-12 DIAGNOSIS — K7682 Hepatic encephalopathy: Secondary | ICD-10-CM | POA: Diagnosis present

## 2021-11-12 DIAGNOSIS — I10 Essential (primary) hypertension: Secondary | ICD-10-CM | POA: Diagnosis present

## 2021-11-12 DIAGNOSIS — Z8249 Family history of ischemic heart disease and other diseases of the circulatory system: Secondary | ICD-10-CM | POA: Diagnosis not present

## 2021-11-12 DIAGNOSIS — Z89512 Acquired absence of left leg below knee: Secondary | ICD-10-CM | POA: Diagnosis not present

## 2021-11-12 DIAGNOSIS — Z833 Family history of diabetes mellitus: Secondary | ICD-10-CM | POA: Diagnosis not present

## 2021-11-12 DIAGNOSIS — E876 Hypokalemia: Secondary | ICD-10-CM | POA: Diagnosis present

## 2021-11-12 DIAGNOSIS — K7030 Alcoholic cirrhosis of liver without ascites: Secondary | ICD-10-CM | POA: Diagnosis present

## 2021-11-12 DIAGNOSIS — S88111A Complete traumatic amputation at level between knee and ankle, right lower leg, initial encounter: Secondary | ICD-10-CM | POA: Diagnosis not present

## 2021-11-12 DIAGNOSIS — F3130 Bipolar disorder, current episode depressed, mild or moderate severity, unspecified: Secondary | ICD-10-CM | POA: Diagnosis present

## 2021-11-12 DIAGNOSIS — Z20822 Contact with and (suspected) exposure to covid-19: Secondary | ICD-10-CM | POA: Diagnosis present

## 2021-11-12 LAB — COMPREHENSIVE METABOLIC PANEL
ALT: 15 U/L (ref 0–44)
AST: 43 U/L — ABNORMAL HIGH (ref 15–41)
Albumin: 3.6 g/dL (ref 3.5–5.0)
Alkaline Phosphatase: 94 U/L (ref 38–126)
Anion gap: 6 (ref 5–15)
BUN: 12 mg/dL (ref 8–23)
CO2: 18 mmol/L — ABNORMAL LOW (ref 22–32)
Calcium: 9.2 mg/dL (ref 8.9–10.3)
Chloride: 115 mmol/L — ABNORMAL HIGH (ref 98–111)
Creatinine, Ser: 0.8 mg/dL (ref 0.61–1.24)
GFR, Estimated: >60 mL/min (ref >60)
Glucose, Bld: 152 mg/dL — ABNORMAL HIGH (ref 70–99)
Potassium: 3.9 mmol/L (ref 3.5–5.1)
Sodium: 139 mmol/L (ref 135–145)
Total Bilirubin: 1.6 mg/dL — ABNORMAL HIGH (ref 0.3–1.2)
Total Protein: 6.3 g/dL — ABNORMAL LOW (ref 6.5–8.1)

## 2021-11-12 LAB — RAPID URINE DRUG SCREEN, HOSP PERFORMED
Amphetamines: NOT DETECTED
Barbiturates: NOT DETECTED
Benzodiazepines: NOT DETECTED
Cocaine: NOT DETECTED
Opiates: NOT DETECTED
Tetrahydrocannabinol: NOT DETECTED

## 2021-11-12 LAB — CBC
HCT: 36.5 % — ABNORMAL LOW (ref 39.0–52.0)
Hemoglobin: 11.9 g/dL — ABNORMAL LOW (ref 13.0–17.0)
MCH: 30.6 pg (ref 26.0–34.0)
MCHC: 32.6 g/dL (ref 30.0–36.0)
MCV: 93.8 fL (ref 80.0–100.0)
Platelets: 87 10*3/uL — ABNORMAL LOW (ref 150–400)
RBC: 3.89 MIL/uL — ABNORMAL LOW (ref 4.22–5.81)
RDW: 16 % — ABNORMAL HIGH (ref 11.5–15.5)
WBC: 4.2 10*3/uL (ref 4.0–10.5)
nRBC: 0 % (ref 0.0–0.2)

## 2021-11-12 LAB — URINALYSIS, ROUTINE W REFLEX MICROSCOPIC
Bilirubin Urine: NEGATIVE
Glucose, UA: NEGATIVE mg/dL
Hgb urine dipstick: NEGATIVE
Ketones, ur: NEGATIVE mg/dL
Leukocytes,Ua: NEGATIVE
Nitrite: NEGATIVE
Protein, ur: NEGATIVE mg/dL
Specific Gravity, Urine: 1.019 (ref 1.005–1.030)
pH: 7 (ref 5.0–8.0)

## 2021-11-12 LAB — HEMOGLOBIN A1C
Hgb A1c MFr Bld: 6.5 % — ABNORMAL HIGH (ref 4.8–5.6)
Mean Plasma Glucose: 139.85 mg/dL

## 2021-11-12 LAB — CBG MONITORING, ED
Glucose-Capillary: 223 mg/dL — ABNORMAL HIGH (ref 70–99)
Glucose-Capillary: 158 mg/dL — ABNORMAL HIGH (ref 70–99)

## 2021-11-12 LAB — HIV ANTIBODY (ROUTINE TESTING W REFLEX): HIV Screen 4th Generation wRfx: NONREACTIVE

## 2021-11-12 LAB — AMMONIA: Ammonia: 82 umol/L — ABNORMAL HIGH (ref 9–35)

## 2021-11-12 LAB — PROTIME-INR
INR: 1.3 — ABNORMAL HIGH (ref 0.8–1.2)
Prothrombin Time: 16.3 seconds — ABNORMAL HIGH (ref 11.4–15.2)

## 2021-11-12 LAB — GLUCOSE, CAPILLARY: Glucose-Capillary: 139 mg/dL — ABNORMAL HIGH (ref 70–99)

## 2021-11-12 MED ORDER — CARVEDILOL 3.125 MG PO TABS
3.1250 mg | ORAL_TABLET | Freq: Two times a day (BID) | ORAL | Status: DC
  Administered 2021-11-12: 3.125 mg via ORAL
  Filled 2021-11-12: qty 1

## 2021-11-12 MED ORDER — METOPROLOL TARTRATE 5 MG/5ML IV SOLN
5.0000 mg | Freq: Once | INTRAVENOUS | Status: AC
  Administered 2021-11-12: 5 mg via INTRAVENOUS
  Filled 2021-11-12: qty 5

## 2021-11-12 MED ORDER — METOPROLOL TARTRATE 5 MG/5ML IV SOLN: 5.0000 mg | Freq: Once | INTRAVENOUS | Status: DC

## 2021-11-12 MED ORDER — INSULIN ASPART 100 UNIT/ML IJ SOLN
0.0000 [IU] | INTRAMUSCULAR | Status: DC
  Administered 2021-11-12: 1 [IU] via SUBCUTANEOUS
  Administered 2021-11-12: 2 [IU] via SUBCUTANEOUS
  Administered 2021-11-12: 3 [IU] via SUBCUTANEOUS
  Administered 2021-11-13: 2 [IU] via SUBCUTANEOUS
  Administered 2021-11-13: 1 [IU] via SUBCUTANEOUS
  Administered 2021-11-13: 3 [IU] via SUBCUTANEOUS
  Administered 2021-11-13: 2 [IU] via SUBCUTANEOUS
  Administered 2021-11-13: 1 [IU] via SUBCUTANEOUS

## 2021-11-12 MED ORDER — LACTATED RINGERS IV SOLN: INTRAVENOUS | Status: AC

## 2021-11-12 MED ORDER — CARVEDILOL 12.5 MG PO TABS
12.5000 mg | ORAL_TABLET | Freq: Two times a day (BID) | ORAL | Status: DC
  Administered 2021-11-12 – 2021-11-18 (×13): 12.5 mg via ORAL
  Filled 2021-11-12 (×14): qty 1

## 2021-11-12 MED ORDER — METOPROLOL TARTRATE 5 MG/5ML IV SOLN: 5.0000 mg | INTRAVENOUS | Status: DC | PRN

## 2021-11-12 NOTE — Assessment & Plan Note (Addendum)
Seems at baseline ?Ammonia down to 60s ?-Continue lactulose ?-Continue rifaximin ?

## 2021-11-12 NOTE — TOC Initial Note (Signed)
Transition of Care (TOC) - Initial/Assessment Note  ? ? ?Patient Details  ?Name: Cory Palmer ?MRN: [id number] ?Date of Birth: 02-06-1961 ? ?Transition of Care (TOC) CM/SW Contact:    ?Lockie Pares, RN ?Phone Number: ?11/12/2021, 5:05 PM ? ?Clinical Narrative:                 ?61 year old patient presented with AMS, living in a hotel, found by security staff on the floor with AMS. Alert to self only at triage, History of liver cirrhosis.Anomia level elevated, likely encephalopathy .Patient is normally wheelchair bound. Noted in February he was at the [facility].  Denies alcohol use.  ?PT consult in for recommendations.  ? ?TOC will follow for needs, recommendations, and transitions. ?Expected Discharge Plan: Skilled Nursing Facility ?Barriers to Discharge: Unsafe home situation ? ? ?Patient Goals and CMS Choice ?  ?  ?  ? ?Expected Discharge Plan and Services ?Expected Discharge Plan: Skilled Nursing Facility ?  ?  ?  ?Living arrangements for the past 2 months: Hotel/Motel ?                ?  ?  ?  ?  ?  ?  ?  ?  ?  ?  ? ?Prior Living Arrangements/Services ?Living arrangements for the past 2 months: Hotel/Motel ?Lives with:: Self ?Patient language and need for interpreter reviewed:: Yes ?       ?Need for Family Participation in Patient Care: Yes (Comment) ?Care giver support system in place?: No (comment) (Has brother listed) ?  ?Criminal Activity/Legal Involvement Pertinent to Current Situation/Hospitalization: No - Comment as needed ? ?Activities of Daily Living ?  ?  ? ?Permission Sought/Granted ?  ?  ?   ?   ?   ?   ? ?Emotional Assessment ?  ?  ?  ?Orientation: : Fluctuating Orientation (Suspected and/or reported [place]) ?Alcohol / Substance Use: Alcohol Use ?Psych Involvement: No (comment) ? ?Admission diagnosis:  Acute encephalopathy [G93.40] ?Patient Active Problem List  ? Diagnosis Date Noted  ? Acute encephalopathy 11/11/2021  ? Thrombocytopenia (HCC) 11/11/2021  ? Leukocytosis 10/10/2020  ? GERD  (gastroesophageal reflux disease) 10/10/2020  ? 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HCC) 05/06/2020  ? Hyponatremia 10/23/2019  ? MRSA bacteremia 10/21/2019  ? Atrial flutter with rapid ventricular response (HCC) 10/21/2019  ? Lactic acidosis 10/21/2019  ? Severe protein-calorie malnutrition (HCC)   ? Diabetic polyneuropathy associated with type 2 diabetes mellitus (HCC)   ? Cirrhosis of liver (HCC) 10/19/2019  ? Anemia 10/19/2019  ? Medication monitoring encounter 08/15/2019  ? Serum total bilirubin elevated 10/29/2018  ? BPH (benign prostatic hyperplasia) 10/29/2018  ? Below-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)   ? PAF (paroxysmal atrial fibrillation) (HCC)   ? Hypokalemia   ? Hypomagnesemia   ? Sepsis without acute organ dysfunction (HCC) 10/05/2018  ? Depressed bipolar disorder (HCC) 10/05/2018  ? Hypertension 10/05/2018  ? Hyperbilirubinemia 10/05/2018  ? Homelessness 05/07/2018  ? Diabetes mellitus type II, non insulin dependent (HCC) 05/07/2018  ? ?PCP:  Dois Davenport, MD ?Pharmacy:   ?[facility] ?[address] ?[place] [address] ?Phone: [phone] Fax: [phone] ? ? ? ? ?Social Determinants of Health (SDOH) Interventions ?  ? ?Readmission Risk Interventions ? ?  10/21/2019  ?  1:04 PM  ?Readmission Risk Prevention Plan  ?Transportation Screening Complete  ?PCP or Specialist Appt within 5-7 Days Complete  ?Home Care Screening Complete  ?Medication Review (RN CM)  Complete  ? ? ? ?

## 2021-11-12 NOTE — Assessment & Plan Note (Addendum)
Due to cirrhosis °

## 2021-11-12 NOTE — Assessment & Plan Note (Addendum)
-  Continue Seroquel, Zoloft and trazodone ?

## 2021-11-12 NOTE — ED Notes (Signed)
Patient transported to MRI 

## 2021-11-12 NOTE — Assessment & Plan Note (Addendum)
-  Continue Coreg ?- not on AC ? ?

## 2021-11-12 NOTE — Hospital Course (Addendum)
Cory Palmer is a 61 y.o. M with pAF not on AC, alcohol use disorder and alcoholic cirrhosis, Bipolar disorder, and esophagitis who presented with confusion. ? ?Evidently found in a hotel room, unable to provide history.   ?

## 2021-11-12 NOTE — Progress Notes (Signed)
?PROGRESS NOTE ? ? ? ?Patient: Cory Palmer                            PCP: Hayden Rasmussen, MD                    ?DOB: 02/20/1961            DOA: 11/11/2021 ?EZM:[id number]             DOS: 11/12/2021, 1:31 PM ? ? LOS: 0 days  ? ?Date of Service: The patient was seen and examined on 11/12/2021 ? ?Subjective:  ? ?The patient was seen and examined this morning. ?Stable at this time. ?Still complaining of :  ?Otherwise no issues overnight . ? ?Brief Narrative:  ? ?JSHON IBE is a 61 y.o. male with medical history significant for paroxysmal atrial fibrillation (not on anticoagulation due to chronic thrombocytopenia and nonadherence), hepatic cirrhosis with portal hypertension, type 2 diabetes, chronic thrombocytopenia, s/p bilateral BKA, bipolar disorder with depression and anxiety, homelessness who is admitted with encephalopathy. ? ?ED Course  Labs/Imaging on admission:   ?Initial vitals showed BP 136/85, pulse 122, RR 24, temp 98.5 ?F rectally, SPO2 100% on room air. ?  ?Labs show WBC 6.1, hemoglobin 12.4, platelets 88,000, sodium 141, potassium 3.3, bicarb 15, BUN 12, creatinine 0.94, serum glucose 194, AST 29, ALT 21, alk phos 104, total bilirubin 1.5, TSH 2.033, CK 48, ammonia 160, serum ethanol <10. ?  ?SARS-CoV-2 and influenza PCR negative.  Urinalysis and UDS pending collection. ?  ?Portable chest x-ray shows low lung volumes without acute cardiopulmonary process. ?  ?CT head without contrast negative for acute endocrine abnormality.  Generalized cerebral atrophy seen. ?  ?Patient was given 1 L LR.  ? ? ? ?Assessment & Plan:  ? ?Principal Problem: ?  Acute encephalopathy ?Active Problems: ?  Cirrhosis of liver ([place]) ?  PAF (paroxysmal atrial fibrillation) ([place]) ?  Diabetes mellitus type II, non insulin dependent ([place]) ?  Thrombocytopenia ([place]) ?  Depressed bipolar disorder ([place]) ? ? ? ? ?Assessment and Plan: ?* Acute encephalopathy ?Patient found down in motel room after unknown period of time in unclear  circumstances.  ?-Still confused, but more awake, himself, no other focal neurological findings ?-History of liver cirrhosis-elevated ammonia level 160 ?Likely hepatic encephalopathy ?-CT of the head, MRI of the brain negative for any acute findings ? CT head negative for acute abnormality.  ?-  CXR negative for acute process.   ? ?-We will continue with lactulose 20 mg 3 times daily ?-Gentle IV fluid hydration  ?-UA, urine drug screen -still pending ?-Obtain urinalysis and UDS ?-Fall precautions ? ?Cirrhosis of liver ([place]) ?- Ammonia level 160 >> 82 ?-Prior imaging suggestive of liver cirrhosis with stigmata of portal hypertension.  ?- Concern is for hepatic encephalopathy as above. ?-  There is no evidence of esophageal varices on last EGD 10/19/2020. ?-Started on lactulose ? ?PAF (paroxysmal atrial fibrillation) ([place]) ?- Heart rate 85-123 ?Known history of PAF.  CHA2DS2-VASc score at least 3.  ? Not on anticoagulation due to history of chronic thrombocytopenia and nonadherence.   ?Was in A-fib with RVR on arrival to ED, rates improved with short-term use of IV diltiazem which has now been discontinued. ?-Keep on telemetry ?-Started on oral carvedilol 3.125 mg twice daily >> will increase dose ? ?Diabetes mellitus type II, non insulin dependent ([place]) ?Hold home metformin and start on sensitive  SSI every 4 hours. ? ?Thrombocytopenia ([place]) ?Chronic and stable, likely secondary to liver cirrhosis. ?-Monitoring platelets 88, 87, ? ?Depressed bipolar disorder ([place]) ?Hold home meds until mental status improves. ?-Mental status is slowly improving, will resume home meds as tolerated ? ? ? ?--------------------------------------------------------------------------------------------------------------------- ? ?DVT prophylaxis:  ?SCDs Start: 11/11/21 2334 ? ? ?Code Status:   Code Status: Full Code ? ?Family Communication: No family member present at bedside- attempt will be made to update daily ?The above findings and plan  of care has been discussed with patient (and family)  in detail,  ?they expressed understanding and agreement of above. ?-Advance care planning has been discussed.  ? ?Disposition : To be determined, challenging patient presented from motel ???  Homeless ?TOC consulted to assist with disposition ? ? ?admission status:   ?Status is: Observation ?The patient remains OBS appropriate and will d/c before 2 midnights. ? ? ? ? ?Procedures:  ? ?No admission procedures for hospital encounter. ? ? ?Antimicrobials:  ?Anti-infectives (From admission, onward)  ? ? None  ? ?  ? ? ? ?Medication:  ? carvedilol  3.125 mg Oral BID WC  ? insulin aspart  0-9 Units Subcutaneous Q4H  ? lactulose  20 g Oral TID  ? sodium chloride flush  3 mL Intravenous Q12H  ? ? ?ondansetron **OR** ondansetron (ZOFRAN) IV ? ? ?Objective:  ? ?Vitals:  ? 11/12/21 0415 11/12/21 0630 11/12/21 0700 11/12/21 1315  ?BP: (!) 145/101 132/81 (!) 148/92 (!) 141/82  ?Pulse: 83 100 85 (!) 123  ?Resp: 19 20 13  (!) 22  ?Temp:      ?TempSrc:      ?SpO2: 98% (!) 76% 100% 97%  ?Weight:      ?Height:      ? ? ?Intake/Output Summary (Last 24 hours) at 11/12/2021 1331 ?Last data filed at 11/12/2021 1319 ?Gross per 24 hour  ?Intake 1226.53 ml  ?Output --  ?Net 1226.53 ml  ? ?Filed Weights  ? 11/11/21 2002  ?Weight: 99.8 kg  ? ? ? ?Examination:  ? ?Physical Exam  ?Constitution: Awake confused ?Psychiatric:   Normal and stable mood and affect, cognition intact,   ?HEENT:        Normocephalic, PERRL, otherwise with in Normal limits  ?Chest:         Chest symmetric ?Cardio vascular:  S1/S2, RRR, No murmure, No Rubs or Gallops  ?pulmonary: Clear to auscultation bilaterally, respirations unlabored, negative wheezes / crackles ?Abdomen: Soft, non-tender, non-distended, bowel sounds,no masses, no organomegaly ?Muscular skeletal: Bilateral BKA Limited exam - in bed, able to move all 4 extremities,   ?Neuro: CNII-XII intact. , normal motor and sensation, reflexes intact  ?Extremities: No  pitting edema lower extremities, +2 pulses  ?Skin: Dry, warm to touch, negative for any Rashes, No open wounds ?Wounds: per nursing documentation ? ? ?------------------------------------------------------------------------------------------------------------------------ ?  ? ?LABs:  ? ?  Latest Ref Rng & Units 11/12/2021  ?  6:56 AM 11/11/2021  ?  8:45 PM 09/13/2021  ?  9:49 AM  ?CBC  ?WBC 4.0 - 10.5 K/uL 4.2   6.1   8.8    ?Hemoglobin 13.0 - 17.0 g/dL 11.9   12.4   12.8    ?Hematocrit 39.0 - 52.0 % 36.5   37.2   39.3    ?Platelets 150 - 400 K/uL 87   88   96    ? ? ?  Latest Ref Rng & Units 11/12/2021  ?  6:56 AM 11/11/2021  ?  8:45  PM 09/13/2021  ?  9:49 AM  ?CMP  ?Glucose 70 - 99 mg/dL 152   194   253    ?BUN 8 - 23 mg/dL 12   12   11     ?Creatinine 0.61 - 1.24 mg/dL 0.80   0.94   0.63    ?Sodium 135 - 145 mmol/L 139   141   139    ?Potassium 3.5 - 5.1 mmol/L 3.9   3.3   3.7    ?Chloride 98 - 111 mmol/L 115   114   106    ?CO2 22 - 32 mmol/L 18   15   25     ?Calcium 8.9 - 10.3 mg/dL 9.2   10.0   9.4    ?Total Protein 6.5 - 8.1 g/dL 6.3   7.2     ?Total Bilirubin 0.3 - 1.2 mg/dL 1.6   1.5     ?Alkaline Phos 38 - 126 U/L 94   104     ?AST 15 - 41 U/L 43   29     ?ALT 0 - 44 U/L 15   21     ? ? ? ? ? ?Micro Results ?Recent Results (from the past 240 hour(s))  ?Resp Panel by RT-PCR (Flu A&B, Covid) Nasopharyngeal Swab     Status: None  ? Collection Time: 11/11/21  7:49 PM  ? Specimen: Nasopharyngeal Swab; Nasopharyngeal(NP) swabs in vial transport medium  ?Result Value Ref Range Status  ? SARS Coronavirus 2 by RT PCR NEGATIVE NEGATIVE Final  ?  Comment: (NOTE) ?SARS-CoV-2 target nucleic acids are NOT DETECTED. ? ?The SARS-CoV-2 RNA is generally detectable in upper respiratory ?specimens during the acute phase of infection. The lowest ?concentration of SARS-CoV-2 viral copies this assay can detect is ?138 copies/mL. A negative result does not preclude SARS-Cov-2 ?infection and should not be used as the sole basis for  treatment or ?other patient management decisions. A negative result may occur with  ?improper specimen collection/handling, submission of specimen other ?than nasopharyngeal swab, presence of viral mutation(

## 2021-11-12 NOTE — Assessment & Plan Note (Addendum)
-  Hold metformin -Continue SS corrections 

## 2021-11-13 DIAGNOSIS — G9340 Encephalopathy, unspecified: Secondary | ICD-10-CM | POA: Diagnosis not present

## 2021-11-13 LAB — GLUCOSE, CAPILLARY
Glucose-Capillary: 142 mg/dL — ABNORMAL HIGH (ref 70–99)
Glucose-Capillary: 156 mg/dL — ABNORMAL HIGH (ref 70–99)
Glucose-Capillary: 129 mg/dL — ABNORMAL HIGH (ref 70–99)
Glucose-Capillary: 223 mg/dL — ABNORMAL HIGH (ref 70–99)
Glucose-Capillary: 104 mg/dL — ABNORMAL HIGH (ref 70–99)
Glucose-Capillary: 172 mg/dL — ABNORMAL HIGH (ref 70–99)

## 2021-11-13 LAB — AMMONIA: Ammonia: 71 umol/L — ABNORMAL HIGH (ref 9–35)

## 2021-11-13 MED ORDER — HALOPERIDOL LACTATE 5 MG/ML IJ SOLN
2.0000 mg | Freq: Four times a day (QID) | INTRAMUSCULAR | Status: DC | PRN
  Administered 2021-11-13: 2 mg via INTRAVENOUS
  Filled 2021-11-13: qty 1

## 2021-11-13 MED ORDER — HALOPERIDOL LACTATE 5 MG/ML IJ SOLN: 2.0000 mg | Freq: Four times a day (QID) | INTRAMUSCULAR | Status: DC | PRN

## 2021-11-13 MED ORDER — QUETIAPINE FUMARATE 50 MG PO TABS
300.0000 mg | ORAL_TABLET | Freq: Every day | ORAL | Status: DC
  Administered 2021-11-13: 300 mg via ORAL
  Filled 2021-11-13: qty 6

## 2021-11-13 MED ORDER — SERTRALINE HCL 50 MG PO TABS
50.0000 mg | ORAL_TABLET | Freq: Every day | ORAL | Status: DC
  Administered 2021-11-13 – 2021-11-18 (×6): 50 mg via ORAL
  Filled 2021-11-13 (×6): qty 1

## 2021-11-13 MED ORDER — ADULT MULTIVITAMIN W/MINERALS CH
1.0000 | ORAL_TABLET | Freq: Every day | ORAL | Status: DC
  Administered 2021-11-13 – 2021-11-18 (×6): 1 via ORAL
  Filled 2021-11-13 (×6): qty 1

## 2021-11-13 MED ORDER — TRAZODONE HCL 50 MG PO TABS
150.0000 mg | ORAL_TABLET | Freq: Every day | ORAL | Status: DC
  Administered 2021-11-13: 150 mg via ORAL
  Filled 2021-11-13: qty 1

## 2021-11-13 MED ORDER — LACTATED RINGERS IV SOLN: INTRAVENOUS | Status: AC

## 2021-11-13 NOTE — Consult Note (Signed)
[facility] Face-to-Face Psychiatry Consult  ? ?Reason for Consult: Polypharmacy, severe agitation ?Referring Physician: Hospitalist ?Patient Identification: Cory Palmer ?MRN:  [id number] ?Principal Diagnosis: Acute encephalopathy ?Diagnosis:  Principal Problem: ?  Acute encephalopathy ?Active Problems: ?  Diabetes mellitus type II, non insulin dependent ([place]) ?  Depressed bipolar disorder ([place]) ?  PAF (paroxysmal atrial fibrillation) ([place]) ?  Cirrhosis of liver ([place]) ?  Thrombocytopenia ([place]) ? ? ?Total Time spent with patient: 30 minutes ? ?Subjective: Patient reports this morning, that he was placed by the [facility] at a motel, does not remember what happened but was found on the floor in the motel room by security staff. ? ?Patient states that he has been on Seroquel for 19 years, reports his current psychotropic medications have kept him stable.  He adds that his primary care physician prescribes the medications but in the past he used to follow-up with [facility], an act team.  On being questioned about feeling depressed, patient denies this.  Patient reports on a scale of 0-10 with 0 no symptoms and 10 being the worst that his depression is a 1 out of 10.  He also denies any manic symptoms, any thoughts of self-harm or harm to others.  Patient also denies any hallucinations. ? ?Patient reports his current medications are Zoloft, Seroquel and trazodone and he has been stable on this medication for many years.  On being questioned where he was, patient stated he was at [facility], was not able to give the month, date or year.  He did state that he knows that  Edmon Crape is the current president.  He is also able to give his psychiatric history, medication management regimen along with a number of providers he has been in regards to his psychiatric illness.   ? ?Patient reports that he has been homeless since December 2022.  He has that he was in stable housing apartment for 6 years prior to him being evicted.  He  states that he lives with his brother in [place], [place] for 3 weeks, reports that they did not get along and so he decided to return back to [place]. ? ?Patient adds was at the [facility] for 2 months and then the [facility] placed him in a motel.  Patient reports that he is able to move around with a wheelchair, does drink wine.  Was unable to elaborate how much it had recently, but does agree that he needs to stop drinking which is the reason he ended up in the hospital. ? ?Patient states that he does have history of suicidal ideation with attempt when he took a bottle of Seroquel, drank excessive alcohol but did not seek help.  Patient admits that he wants stable housing, has no thoughts of self-harm, thoughts of harm to others, denies any hallucinations, paranoia, any side effects with his medications ? ?HPI:  Cory Palmer is a 61 y.o. male patient admitted with medical history significant for paroxysmal atrial fibrillation, hepatic cirrhosis with portal hypertension, type 2 diabetes, chronic thrombocytopenia, status post bilateral BKA, bipolar disorder with depression and anxiety.  Patient was admitted due to encephalopathy. ? ?Past Psychiatric History: As mentioned above, patient has seen multiple psychiatric providers over the years, his recent was 2-1/2 years ago with [facility].  Patient reports that his primary care physician currently prescribes his psychotropic medications, and that he has been on Seroquel since 19 years and has been psychiatrically stable on the combination of Zoloft 50 mg daily, Seroquel 300 mg  at bedtime and trazodone 150 mg at bedtime.  Patient denies any side effects with his medications ? ?Risk to Self:   ?Risk to Others:   ?Prior Inpatient Therapy:   ?Prior Outpatient Therapy:   ? ?Past Medical History:  ?Past Medical History:  ?Diagnosis Date  ? Anxiety   ? Depressed bipolar disorder ([place])   ? Diabetes mellitus without complication ([place])   ? Hypertension   ? Liver cirrhosis ([place])    ? Osteomyelitis of left foot ([place]) 10/19/2019  ? Osteomyelitis of toe of left foot ([place])   ? PAF (paroxysmal atrial fibrillation) ([place])   ? S/P transmetatarsal amputation of foot, left ([place]) 09/28/2018  ?  ?Past Surgical History:  ?Procedure Laterality Date  ? AMPUTATION Left 09/28/2018  ? Procedure: LEFT TRANSMETATARSAL AMPUTATION;  Surgeon: Newt Minion, MD;  Location: [place];  Service: Orthopedics;  Laterality: Left;  ? AMPUTATION Left 10/23/2019  ? Procedure: AMPUTATION BELOW KNEE;  Surgeon: Newt Minion, MD;  Location: [place];  Service: Orthopedics;  Laterality: Left;  ? BELOW KNEE LEG AMPUTATION Right   ? ESOPHAGOGASTRODUODENOSCOPY (EGD) WITH PROPOFOL N/A 10/19/2020  ? Procedure: ESOPHAGOGASTRODUODENOSCOPY (EGD) WITH PROPOFOL;  Surgeon: Juanita Craver, MD;  Location: [facility] ENDOSCOPY;  Service: Endoscopy;  Laterality: N/A;  ? TEE WITHOUT CARDIOVERSION N/A 10/25/2019  ? Procedure: TRANSESOPHAGEAL ECHOCARDIOGRAM (TEE);  Surgeon: Lelon Perla, MD;  Location[facility] ENDOSCOPY;  Service: Cardiovascular;  Laterality: N/A;  ? ?Family History:  ?Family History  ?Problem Relation Age of Onset  ? Hypertension Other   ? Diabetes Mellitus II Other   ? ?Family Psychiatric  History: Patient reports ?Social History:  ?Social History  ? ?Substance and Sexual Activity  ?Alcohol Use Not Currently  ?   ?Social History  ? ?Substance and Sexual Activity  ?Drug Use Not Currently  ? Types: Cocaine  ?  ?Social History  ? ?Socioeconomic History  ? Marital status: Single  ?  Spouse name: Not on file  ? Number of children: Not on file  ? Years of education: Not on file  ? Highest education level: Not on file  ?Occupational History  ? Not on file  ?Tobacco Use  ? Smoking status: Every Day  ?  Packs/day: 0.25  ?  Types: Cigarettes  ? Smokeless tobacco: Never  ?Vaping Use  ? Vaping Use: Never used  ?Substance and Sexual Activity  ? Alcohol use: Not Currently  ? Drug use: Not Currently  ?  Types: Cocaine  ? Sexual activity: Not Currently  ?Other  Topics Concern  ? Not on file  ?Social History Narrative  ? Not on file  ? ?Social Determinants of Health  ? ?Financial Resource Strain: Not on file  ?Food Insecurity: Not on file  ?Transportation Needs: Not on file  ?Physical Activity: Not on file  ?Stress: Not on file  ?Social Connections: Not on file  ? ?Additional Social History: ?  ? ?Allergies:  No Known Allergies ? ?Labs:  ?Results for orders placed or performed during the hospital encounter of 11/11/21 (from the past 48 hour(s))  ?Resp Panel by RT-PCR (Flu A&B, Covid) Nasopharyngeal Swab     Status: None  ? Collection Time: 11/11/21  7:49 PM  ? Specimen: Nasopharyngeal Swab; Nasopharyngeal(NP) swabs in vial transport medium  ?Result Value Ref Range  ? SARS Coronavirus 2 by RT PCR NEGATIVE NEGATIVE  ?  Comment: (NOTE) ?SARS-CoV-2 target nucleic acids are NOT DETECTED. ? ?The SARS-CoV-2 RNA is generally detectable in upper respiratory ?specimens during  the acute phase of infection. The lowest ?concentration of SARS-CoV-2 viral copies this assay can detect is ?138 copies/mL. A negative result does not preclude SARS-Cov-2 ?infection and should not be used as the sole basis for treatment or ?other patient management decisions. A negative result may occur with  ?improper specimen collection/handling, submission of specimen other ?than nasopharyngeal swab, presence of viral mutation(s) within the ?areas targeted by this assay, and inadequate number of viral ?copies(<138 copies/mL). A negative result must be combined with ?clinical observations, patient history, and epidemiological ?information. The expected result is Negative. ? ?Fact Sheet for Patients:  ?EntrepreneurPulse.com.au ? ?Fact Sheet for Healthcare Providers:  ?IncredibleEmployment.be ? ?This test is no t yet approved or cleared by the [place] FDA and  ?has been authorized for detection and/or diagnosis of SARS-CoV-2 by ?FDA under an Emergency Use Authorization  (EUA). This EUA will remain  ?in effect (meaning this test can be used) for the duration of the ?COVID-19 declaration under Section 564(b)(1) of the Act, 21 ?U.S.C.section 360bbb-3(b)(1), unless the authori

## 2021-11-13 NOTE — Evaluation (Addendum)
Physical Therapy Evaluation ?Patient Details ?Name: Cory Palmer ?MRN: [id number] ?DOB: 07/18/1961 ?Today's Date: 11/13/2021 ? ?History of Present Illness ? The pt is a 61 yo male presenting 4/13 after being found down by [employer] Upon work up, pt with AMS, concern for hepatic encephalopathy due to known liver cirrhosis. PMH includes: afib (not adherent to medications), hepatic cirrhosis, DM II, bipolar disorder with depression and anxiety, and bilateral BKA. ?  ?Clinical Impression ? Pt in bed upon arrival of PT, agreeable to evaluation at this time. Prior to admission the pt was living at a motel, mobilizing with his WC, but able to transfer from [facility] to toilet, tub, or car independently. The pt continues to present today with AMS, tangential at times with poor attention and frequently unable to complete his thought without getting distracted. The pt reports he would like to return to his shelter at d/c but is unsure if he will be allowed. He was able to complete lateral scoot transfer to recliner with minG for safety, does not have prosthetics so this is his highest level of mobility. Will continue to benefit from skilled PT to maintain mobility, strength, and LE ROM. At this time the pt demos poor ability to problem solve for basic needs or assess safety with mobility, would need more assist than is often available at shelters for medical management and safety with mobility, therefore, recommend short stint SNF while AMS persists.    ?   ? ?Recommendations for follow up therapy are one component of a multi-disciplinary discharge planning process, led by the attending physician.  Recommendations may be updated based on patient status, additional functional criteria and insurance authorization. ? ?Follow Up Recommendations Skilled nursing-short term rehab (<3 hours/day) ? ?  ?Assistance Recommended at Discharge Set up Supervision/Assistance  ?Patient can return home with the following ? A little help with  walking and/or transfers;Assistance with cooking/housework;Direct supervision/assist for medications management;Assist for transportation;Help with stairs or ramp for entrance ? ?  ?Equipment Recommendations None recommended by PT (unless we are unable to locate the pt's wheelchair, then will need new WC) ?  ?Recommendations for Other Services ? OT consult  ?  ?Functional Status Assessment Patient has had a recent decline in their functional status and demonstrates the ability to make significant improvements in function in a reasonable and predictable amount of time.  ? ?  ?Precautions / Restrictions Precautions ?Precautions: Fall ?Precaution Comments: pt with bilateral BKA (chronic) no prosthetics ?Restrictions ?Weight Bearing Restrictions: No  ? ?  ? ?Mobility ? Bed Mobility ?Overal bed mobility: Independent ?  ?  ?  ?  ?  ?  ?  ?  ? ?Transfers ?Overall transfer level: Needs assistance ?Equipment used: None ?Transfers: Bed to chair/wheelchair/BSC ?  ?  ?  ?  ?  ? Lateral/Scoot Transfers: Min guard ?General transfer comment: minG for safety, increased time but no assist other than set-up/positioning of recliner for safe transfer ?  ? ?Ambulation/Gait ?  ?  ?  ?  ?  ?  ?  ?General Gait Details: pt does not have prosthetics, does not ambulate at baseline ? ?  ? ?Balance Overall balance assessment: Mild deficits observed, not formally tested ?  ?  ?  ?  ?  ?  ?  ?  ?  ?  ?  ?  ?  ?  ?  ?  ?  ?  ?   ? ? ? ?Pertinent Vitals/Pain Pain Assessment ?Pain Assessment: No/denies pain  ? ? ?  Home Living Family/patient expects to be discharged to:: Shelter/Homeless ?  ?  ?  ?  ?  ?  ?  ?  ?  ?Additional Comments: pt states he had been placed at motel by shelter [facility]?) poor cognition and no family or friends to support  ?  ?Prior Function Prior Level of Function : Independent/Modified Independent ?  ?  ?  ?  ?  ?  ?Mobility Comments: pt using WC for mobility, able to perform scooting transfers from [facility] to toilet to tub without  issue. ?ADLs Comments: pt reports independence ?  ? ? ?Hand Dominance  ?   ? ?  ?Extremity/Trunk Assessment  ? Upper Extremity Assessment ?Upper Extremity Assessment: Overall WFL for tasks assessed ?  ? ?Lower Extremity Assessment ?Lower Extremity Assessment: RLE deficits/detail;LLE deficits/detail ?RLE Deficits / Details: chronic BKA, functional ROM and strength. abrasion on outside of R shin ?RLE Sensation: WNL ?RLE Coordination: WNL ?LLE Deficits / Details: chronic BKA, functional ROM and strength. small scabs around knee, bump distal to knee that pt states is from car transfers. ?LLE Sensation: WNL ?LLE Coordination: WNL ?  ? ?Cervical / Trunk Assessment ?Cervical / Trunk Assessment: Normal  ?Communication  ? Communication: No difficulties  ?Cognition Arousal/Alertness: Awake/alert ?Behavior During Therapy: [facility] for tasks assessed/performed ?Overall Cognitive Status: No family/caregiver present to determine baseline cognitive functioning ?Area of Impairment: Memory, Attention, Safety/judgement, Awareness, Problem solving ?  ?  ?  ?  ?  ?  ?  ?  ?  ?Current Attention Level: Focused ?Memory: Decreased recall of precautions, Decreased short-term memory ?  ?Safety/Judgement: Decreased awareness of safety, Decreased awareness of deficits ?Awareness: Intellectual ?Problem Solving: Slow processing ?General Comments: pt tangential and difficulty finishing thoughts due to poor attention. difficulty word-finding at times and needed increased cues and time to complete. able to sequence functional tasks such as scooting to chair without issue ?  ?  ? ?  ?General Comments General comments (skin integrity, edema, etc.): VSS on RA ? ?  ?   ? ?Assessment/Plan  ?  ?PT Assessment Patient needs continued PT services  ?PT Problem List Decreased mobility;Decreased safety awareness ? ?   ?  ?PT Treatment Interventions Functional mobility training;Therapeutic activities;Therapeutic exercise;Balance training;Patient/family education    ? ?PT Goals (Current goals can be found in the Care Plan section)  ?Acute Rehab PT Goals ?Patient Stated Goal: to find his wheelchair ?PT Goal Formulation: With patient ?Time For Goal Achievement: 11/27/21 ?Potential to Achieve Goals: Good ? ?  ?Frequency Min 2X/week ?  ? ? ?   ?AM-PAC PT "6 Clicks" Mobility  ?Outcome Measure Help needed turning from your back to your side while in a flat bed without using bedrails?: None ?Help needed moving from lying on your back to sitting on the side of a flat bed without using bedrails?: None ?Help needed moving to and from a bed to a chair (including a wheelchair)?: A Little ?Help needed standing up from a chair using your arms (e.g., wheelchair or bedside chair)?: A Little ?Help needed to walk in hospital room?: Total ?Help needed climbing 3-5 steps with a railing? : Total ?6 Click Score: 16 ? ?  ?End of Session   ?Activity Tolerance: Patient tolerated treatment well ?Patient left: in chair;with call bell/phone within reach;with chair alarm set ?Nurse Communication: Mobility status ?PT Visit Diagnosis: Other abnormalities of gait and mobility (R26.89);History of falling (Z91.81) ?  ? ?Time: 3664-4034 ?PT Time Calculation (min) (ACUTE ONLY): 21 min ? ? ?  Charges:   PT Evaluation ?$PT Eval Low Complexity: 1 Low ?  ?  ?   ? ? ?Vickki MuffKatie Zhou, PT, DPT  ? ?Acute Rehabilitation Department ?Pager #: [phone] ? ?Ronnie DerbyKatie F Zhou ?11/13/2021, 1:12 PM ? ?

## 2021-11-13 NOTE — Progress Notes (Signed)
?PROGRESS NOTE ? ? ? ?Patient: Cory Palmer                            PCP: Hayden Rasmussen, MD                    ?DOB: [birth year]            DOA: 11/11/2021 ?HQR:[id number]             DOS: 11/13/2021, 2:46 PM ? ? LOS: 1 day  ? ?Date of Service: The patient was seen and examined on 11/13/2021 ? ?Subjective:  ? ?The patient was seen and examined this morning, mentation has much improved ?Cooperative in no acute distress ?States that he lives in a motel, he is homeless. ?States that he will be compliant with his recommended medication ? ?Brief Narrative:  ? ?Cory Palmer is a 61 y.o. male with medical history significant for paroxysmal atrial fibrillation (not on anticoagulation due to chronic thrombocytopenia and nonadherence), hepatic cirrhosis with portal hypertension, type 2 diabetes, chronic thrombocytopenia, s/p bilateral BKA, bipolar disorder with depression and anxiety, homelessness who is admitted with encephalopathy. ? ?ED Course  Labs/Imaging on admission:   ?Initial vitals showed BP 136/85, pulse 122, RR 24, temp 98.5 ?F rectally, SPO2 100% on room air. ?  ?Labs show WBC 6.1, hemoglobin 12.4, platelets 88,000, sodium 141, potassium 3.3, bicarb 15, BUN 12, creatinine 0.94, serum glucose 194, AST 29, ALT 21, alk phos 104, total bilirubin 1.5, TSH 2.033, CK 48, ammonia 160, serum ethanol <10. ?  ?SARS-CoV-2 and influenza PCR negative.  Urinalysis and UDS pending collection. ?  ?Portable chest x-ray shows low lung volumes without acute cardiopulmonary process. ?  ?CT head without contrast negative for acute endocrine abnormality.  Generalized cerebral atrophy seen. ?  ?Patient was given 1 L LR.  ? ? ? ?Assessment & Plan:  ? ?Principal Problem: ?  Acute encephalopathy ?Active Problems: ?  Cirrhosis of liver ([place]) ?  PAF (paroxysmal atrial fibrillation) ([place]) ?  Diabetes mellitus type II, non insulin dependent ([place]) ?  Thrombocytopenia ([place]) ?  Depressed bipolar disorder ([place]) ? ? ? ? ?Assessment and Plan: ?*  Acute encephalopathy ?Patient found down in motel room after unknown period of time in unclear circumstances.  ?-Mentation has much improved, back to baseline ? ?-History of liver cirrhosis-elevated ammonia level 160 ?Likely hepatic encephalopathy, along with polypharmacy ?-CT of the head, MRI of the brain negative for any acute findings ? CT head negative for acute abnormality.  ?-  CXR negative for acute process.   ? ?-We will continue with lactulose 20 mg 3 times daily ?-Gentle IV fluid hydration  ?-UA, urine drug screen -were negative ?-Fall precautions ? ?-His psych meds was reviewed, modified, ?-Psych was consulted, and agreed with current medication ? ?Cirrhosis of liver ([place]) ?- Ammonia level 160 >> 82 >>71 ?-Prior imaging suggestive of liver cirrhosis with stigmata of portal hypertension.  ?- Concern is for hepatic encephalopathy as above. ?-  There is no evidence of esophageal varices on last EGD 10/19/2020. ?-Started on lactulose ? ?PAF (paroxysmal atrial fibrillation) ([place]) ?- Heart rate 85-123 ?Known history of PAF.  CHA2DS2-VASc score at least 3.  ? Not on anticoagulation due to history of chronic thrombocytopenia and nonadherence.   ?-Platelets 88, 87 ?Was in A-fib with RVR on arrival to ED, rates improved with short-term use of IV diltiazem which has now been discontinued. ?-Keep  on telemetry ?-Started on oral carvedilol 3.125 mg twice daily >> will increase dose ? ? ?Diabetes mellitus type II, non insulin dependent ([place]) ?Hold home metformin and start on sensitive SSI  ?-Blood sugars 156, 129, 223 ?-Changing CBG to q. [place] S ?We will restart metformin ? ?Thrombocytopenia ([place]) ?Chronic and stable, likely secondary to liver cirrhosis. ?-Monitoring platelets 88, 87,  ? ?Depressed bipolar disorder ([place]) ?- Home medications were on hold ?-Mentation has improved ?-Meds were reviewed ?-Psych was consulted: Recommended ?-Zoloft 150 mg daily, Seroquel 300 mg nightly and trazodone 150 mg nightly to be  continued.. ? ? ? ?--------------------------------------------------------------------------------------------------------------- ? ?DVT prophylaxis:  ?SCDs Start: 11/11/21 2334 ? ? ?Code Status:   Code Status: Full Code ? ?Family Communication: No family member present at bedside- attempt will be made to update daily ?The above findings and plan of care has been discussed with patien  in detail,  ?they expressed understanding and agreement of above. ?-Advance care planning has been discussed.  ? ?Disposition :  ?To be determined, challenging patient presented from motel ?Patient is homeless ?TOC consulted to assist with disposition ? ?Per PT patient can be discharged to SNF in 1 day  ? ? ?admission status:   ?Status is: Observation ?The patient remains OBS appropriate and will d/c before 2 midnights. ? ? ? ? ?Procedures:  ? ?No admission procedures for hospital encounter. ? ? ?Antimicrobials:  ?Anti-infectives (From admission, onward)  ? ? None  ? ?  ? ? ? ?Medication:  ? carvedilol  12.5 mg Oral BID WC  ? insulin aspart  0-9 Units Subcutaneous Q4H  ? lactulose  20 g Oral TID  ? multivitamin with minerals  1 tablet Oral Daily  ? QUEtiapine  300 mg Oral QHS  ? sertraline  50 mg Oral Daily  ? sodium chloride flush  3 mL Intravenous Q12H  ? traZODone  150 mg Oral QHS  ? ? ?haloperidol lactate, metoprolol tartrate, ondansetron **OR** ondansetron (ZOFRAN) IV ? ? ?Objective:  ? ?Vitals:  ? 11/12/21 1517 11/12/21 1738 11/12/21 2031 11/13/21 0525  ?BP: 123/65 (!) 149/82 106/77   ?Pulse: 79 81    ?Resp: _0 ?Temp: 98.4 ?F (36.9 ?C) 97.9 ?F (36.6 ?C) 98 ?F (36.7 ?C) (!) 97 ?F (36.1 ?C)  ?TempSrc: Oral Oral Oral   ?SpO2: 98% 99% 99%   ?Weight:      ?Height:      ? ? ?Intake/Output Summary (Last 24 hours) at 11/13/2021 1446 ?Last data filed at 11/13/2021 1014 ?Gross per 24 hour  ?Intake 958.14 ml  ?Output --  ?Net 958.14 ml  ? ?Filed Weights  ? 11/11/21 2002  ?Weight: 99.8 kg  ? ? ? ?Examination:  ? ? ? ?Physical Exam: ?   ?General:  AAO x 3,  cooperative, no distress;   ?HEENT:  Normocephalic, PERRL, otherwise with in Normal limits   ?Neuro:  CNII-XII intact. , normal motor and sensation, reflexes intact   ?Lungs:   Clear to auscultation BL, Respirations unlabored,  ?No wheezes / crackles  ?Cardio:    S1/S2, RRR, No murmure, No Rubs or Gallops   ?Abdomen:  Soft, non-tender, bowel sounds active all four quadrants, ?no guarding or peritoneal signs.  ?Muscular  ?skeletal:  Bilateral BKA - Limited exam -global generalized weaknesses ?- in bed, able to move all 4 extremities,   ?2+ pulses,  symmetric, No pitting edema  ?Skin:  Dry, warm to touch, negative for any Rashes,  ?Wounds: Please see  nursing documentation ?   ? ? ?  ? ?------------------------------------------------------------------------------------------------------------------------ ?  ? ?LABs:  ? ?  Latest Ref Rng & Units 11/12/2021  ?  6:56 AM 11/11/2021  ?  8:45 PM 09/13/2021  ?  9:49 AM  ?CBC  ?WBC 4.0 - 10.5 K/uL 4.2   6.1   8.8    ?Hemoglobin 13.0 - 17.0 g/dL 11.9   12.4   12.8    ?Hematocrit 39.0 - 52.0 % 36.5   37.2   39.3    ?Platelets 150 - 400 K/uL 87   88   96    ? ? ?  Latest Ref Rng & Units 11/12/2021  ?  6:56 AM 11/11/2021  ?  8:45 PM 09/13/2021  ?  9:49 AM  ?CMP  ?Glucose 70 - 99 mg/dL 152   194   253    ?BUN 8 - 23 mg/dL _0 ?Creatinine 0.61 - 1.24 mg/dL 0.80   0.94   0.63    ?Sodium 135 - 145 mmol/L 139   141   139    ?Potassium 3.5 - 5.1 mmol/L 3.9   3.3   3.7    ?Chloride 98 - 111 mmol/L 115   114   106    ?CO2 22 - 32 mmol/L _1 ?Calcium 8.9 - 10.3 mg/dL 9.2   10.0   9.4    ?Total Protein 6.5 - 8.1 g/dL 6.3   7.2     ?Total Bilirubin 0.3 - 1.2 mg/dL 1.6   1.5     ?Alkaline Phos 38 - 126 U/L 94   104     ?AST 15 - 41 U/L 43   29     ?ALT 0 - 44 U/L 15   21     ? ? ? ? ? ?Micro Results ?Recent Results (from the past 240 hour(s))  ?Resp Panel by RT-PCR (Flu A&B, Covid) Nasopharyngeal Swab     Status: None  ? Collection Time: 11/11/21  7:49  PM  ? Specimen: Nasopharyngeal Swab; Nasopharyngeal(NP) swabs in vial transport medium  ?Result Value Ref Range Status  ? SARS Coronavirus 2 by RT PCR NEGATIVE NEGATIVE Final  ?  Comment: (NOTE) ?SARS-CoV-2

## 2021-11-14 DIAGNOSIS — G9340 Encephalopathy, unspecified: Secondary | ICD-10-CM | POA: Diagnosis not present

## 2021-11-14 LAB — GLUCOSE, CAPILLARY
Glucose-Capillary: 154 mg/dL — ABNORMAL HIGH (ref 70–99)
Glucose-Capillary: 122 mg/dL — ABNORMAL HIGH (ref 70–99)
Glucose-Capillary: 184 mg/dL — ABNORMAL HIGH (ref 70–99)
Glucose-Capillary: 170 mg/dL — ABNORMAL HIGH (ref 70–99)

## 2021-11-14 LAB — AMMONIA: Ammonia: 43 umol/L — ABNORMAL HIGH (ref 9–35)

## 2021-11-14 MED ORDER — PANTOPRAZOLE SODIUM 40 MG PO TBEC
40.0000 mg | DELAYED_RELEASE_TABLET | Freq: Two times a day (BID) | ORAL | Status: DC
  Administered 2021-11-14 – 2021-11-18 (×10): 40 mg via ORAL
  Filled 2021-11-14 (×10): qty 1

## 2021-11-14 MED ORDER — INSULIN ASPART 100 UNIT/ML IJ SOLN
0.0000 [IU] | Freq: Every day | INTRAMUSCULAR | Status: DC
  Administered 2021-11-15: 2 [IU] via SUBCUTANEOUS
  Administered 2021-11-17: 3 [IU] via SUBCUTANEOUS

## 2021-11-14 MED ORDER — NICOTINE 7 MG/24HR TD PT24
7.0000 mg | MEDICATED_PATCH | Freq: Every day | TRANSDERMAL | Status: DC
  Administered 2021-11-14: 7 mg via TRANSDERMAL
  Filled 2021-11-14 (×2): qty 1

## 2021-11-14 MED ORDER — QUETIAPINE FUMARATE 50 MG PO TABS
100.0000 mg | ORAL_TABLET | Freq: Every day | ORAL | Status: DC
  Administered 2021-11-14 – 2021-11-15 (×2): 100 mg via ORAL
  Filled 2021-11-14 (×2): qty 2

## 2021-11-14 MED ORDER — SUCRALFATE 1 G PO TABS
1.0000 g | ORAL_TABLET | Freq: Three times a day (TID) | ORAL | Status: DC
  Administered 2021-11-14 – 2021-11-18 (×17): 1 g via ORAL
  Filled 2021-11-14 (×18): qty 1

## 2021-11-14 MED ORDER — INSULIN ASPART 100 UNIT/ML IJ SOLN
0.0000 [IU] | Freq: Three times a day (TID) | INTRAMUSCULAR | Status: DC
  Administered 2021-11-14: 2 [IU] via SUBCUTANEOUS
  Administered 2021-11-14: 1 [IU] via SUBCUTANEOUS
  Administered 2021-11-14 – 2021-11-15 (×3): 2 [IU] via SUBCUTANEOUS
  Administered 2021-11-15: 1 [IU] via SUBCUTANEOUS
  Administered 2021-11-16 (×3): 2 [IU] via SUBCUTANEOUS
  Administered 2021-11-17: 1 [IU] via SUBCUTANEOUS
  Administered 2021-11-17: 5 [IU] via SUBCUTANEOUS
  Administered 2021-11-17: 1 [IU] via SUBCUTANEOUS
  Administered 2021-11-18: 2 [IU] via SUBCUTANEOUS
  Administered 2021-11-18 (×2): 1 [IU] via SUBCUTANEOUS

## 2021-11-14 NOTE — Progress Notes (Signed)
?PROGRESS NOTE ? ? ?Cory Palmer  [id number] DOB: [birth year] DOA: 11/11/2021 ?PCP: Hayden Rasmussen, MD  ?Brief Narrative:  ?61 year old white male with paroxysmal A-fib not on AC despite CHA2DS2-VASc >3 ?Chronic prior ethanolism with cirrhosis portal hypertension ?Bilateral BKA-left BKA 09/28/2018 (MSSA strep bacteremia previously) ?Impaired glucose tolerance A1c 6.6 recently ?BPH ?Bipolar with complication of homelessness ?Chronic noncompliance on meds ? ?Prior hospitalization severe esophagitis last admitted 3/12 through 10/20/2021-EGD at the time showed candidiasis grade D reflux and erosions-he was started on Carafate Protonix and fluconazole ? ?Was admitted [id number] toxic metabolic encephalopathy and found down in hotel room unable to provide history ? ?WBC 6.1 platelets 88 ammonia 160 CK 48 potassium 3.3 ? ?Hospital-Problem based course ? 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 ?Continue lactulose 20 3 times daily mentation has improved-stop checking ammonia ?MRI brain no specific issue other than cirrhosis related findings ?Severe PAD status post bilateral BKA's prior MSSA bacteremia ?Not on aspirin/Plavix presumably because of thrombocytopenia-a.m. labs ?A-fib CHA2DS2-VASc >3 not on anticoagulation 2/2 thrombocytopenia ?Has bled score >3 ?Hemoglobin to be checked in morning ?Mild metabolic acidosis ?Mild hypokalemia on admission ?Improving from admission still not normalized ?Cirrhosis liver, MELD-Na on admit 12 ?Patient has been counseled to stop drinking ?Continue Coreg 12.5 twice daily may select nonselective beta-blocker going forward ?A1c recently 6 ?Unclear if will be compliant-metformin was added last hospitalization-we will talk to patient about the same ?Bipolar ?Cut back Seroquel to 100 at bedtime-stop trazodone stop Haldol ?Can continue Zoloft 50 every morning ?Recent hospitalization severe candidal esophagitis on  EGD ?Continue Protonix 40 twice daily and consider outpatient scope-continue Carafate 1 g 3 times daily ?Homelessness affecting care ?Noncompliance with medications ?Patient lost his [employer] power scooter outside the [employer] [address] ?TOC to see if they can assist ? ?DVT prophylaxis: SCD ?Code Status: Full ?Family Communication: None ?Disposition:  ?Status is: Inpatient ?Remains inpatient appropriate because:  ? ?Sick ?Not ready for discharge adjusting meds and needs safe discharge plan ? ? ?Subjective: ?Awake alert coherent no distress slurs words a little bit seems a little sleepy but can tell me where he is date time year ? ?Objective: ?Vitals:  ? 11/13/21 1445 11/13/21 2018 11/14/21 0403 11/14/21 0456  ?BP: 110/65 103/64  102/62  ?Pulse: 60     ?Resp: 18 17    ?Temp: 97.7 ?F (36.5 ?C) 97.9 ?F (36.6 ?C) 97.6 ?F (36.4 ?C)   ?TempSrc: Oral Oral Oral   ?SpO2: 98% 98% 98%   ?Weight:   111 kg   ?Height:      ? ? ?Intake/Output Summary (Last 24 hours) at 11/14/2021 1439 ?Last data filed at 11/14/2021 1115 ?Gross per 24 hour  ?Intake 150 ml  ?Output 2350 ml  ?Net -2200 ml  ? ?Filed Weights  ? 11/11/21 2002 11/14/21 0403  ?Weight: 99.8 kg 111 kg  ? ? ?Examination: ? ?Eomi ncat  ?Cta b  ?Abd soft nt nd no rebound ?BKA bilaterally ?Neuro intact ? ?Data Reviewed: personally reviewed  ? ?CBC ?   ?Component Value Date/Time  ? WBC 4.2 11/12/2021 0656  ? RBC 3.89 (L) 11/12/2021 0656  ? HGB 11.9 (L) 11/12/2021 0656  ? HCT 36.5 (L) 11/12/2021 0656  ? PLT 87 (L) 11/12/2021 0656  ? MCV 93.8 11/12/2021 0656  ? MCH 30.6 11/12/2021 0656  ? MCHC 32.6 11/12/2021 0656  ? RDW 16.0 (H) 11/12/2021 0656  ? LYMPHSABS 0.9 11/11/2021 2045  ? MONOABS 0.7  11/11/2021 2045  ? EOSABS 0.2 11/11/2021 2045  ? BASOSABS 0.0 11/11/2021 2045  ? ? ?  Latest Ref Rng & Units 11/12/2021  ?  6:56 AM 11/11/2021  ?  8:45 PM 09/13/2021  ?  9:49 AM  ?CMP  ?Glucose 70 - 99 mg/dL 152   194   253    ?BUN 8 - 23 mg/dL 12   12   11     ?Creatinine 0.61 - 1.24 mg/dL  0.80   0.94   0.63    ?Sodium 135 - 145 mmol/L 139   141   139    ?Potassium 3.5 - 5.1 mmol/L 3.9   3.3   3.7    ?Chloride 98 - 111 mmol/L 115   114   106    ?CO2 22 - 32 mmol/L 18   15   25     ?Calcium 8.9 - 10.3 mg/dL 9.2   10.0   9.4    ?Total Protein 6.5 - 8.1 g/dL 6.3   7.2     ?Total Bilirubin 0.3 - 1.2 mg/dL 1.6   1.5     ?Alkaline Phos 38 - 126 U/L 94   104     ?AST 15 - 41 U/L 43   29     ?ALT 0 - 44 U/L 15   21     ? ? ? ?Radiology Studies: ?No results found. ? ? ?Scheduled Meds: ? carvedilol  12.5 mg Oral BID WC  ? insulin aspart  0-5 Units Subcutaneous QHS  ? insulin aspart  0-9 Units Subcutaneous TID WC  ? lactulose  20 g Oral TID  ? multivitamin with minerals  1 tablet Oral Daily  ? pantoprazole  40 mg Oral BID  ? QUEtiapine  300 mg Oral QHS  ? sertraline  50 mg Oral Daily  ? sodium chloride flush  3 mL Intravenous Q12H  ? sucralfate  1 g Oral TID WC & HS  ? traZODone  150 mg Oral QHS  ? ?Continuous Infusions: ? ? LOS: 2 days  ? ?Time spent: 40 ? ?Nita Sells, MD ?[facility] ?To contact the attending provider between 7A-7P or the covering provider during after hours 7P-7A, please log into the web site [email or web address] and access using universal [place] password for that web site. If you do not have the password, please call the hospital operator. ? ?11/14/2021, 2:39 PM  ? ? ?

## 2021-11-14 NOTE — Evaluation (Signed)
Occupational Therapy Evaluation ?Patient Details ?Name: Cory Palmer ?MRN: [id number] ?DOB: 09/12/1960 ?Today's Date: 11/14/2021 ? ? ?History of Present Illness The pt is a 61 yo male presenting 4/13 after being found down by [employer] Upon work up, pt with AMS, concern for hepatic encephalopathy due to known liver cirrhosis. PMH includes: afib (not adherent to medications), hepatic cirrhosis, DM II, bipolar disorder with depression and anxiety, and bilateral BKA.  ? ?Clinical Impression ?  ?61 year old male reports indep with ADLs and mobility prior to admission. Agreeable and cooperative throughout evaluation. He demonstrated ability to transfer to and from EOB as well as transfer to "standing" on B residual limbs on bed surface despite OT requesting he perform seated scoot. Callous formations to B tibial tuberosity suggest he likely ambulates on his knees at times. He reports he has not had his w/c or electric w/c for some time indicating this was his means of mobility. Pt reported several times that he was used to getting up in the mornings and getting ready. He also states that someone came in his room and told him he could now have his prostheses despite previously being unable to qualify for insurance to pay for them. Unsure if this report is accurate. Pt would benefit from OT to address safety awareness and indep with ADLs especially if able to progress to use of BLE prosthetics. Will likely require limb shrinkers and shaping prior to use if he is in fact able to acquire his prosthetic limbs. Will continue to follow acutely.  ?   ? ?Recommendations for follow up therapy are one component of a multi-disciplinary discharge planning process, led by the attending physician.  Recommendations may be updated based on patient status, additional functional criteria and insurance authorization.  ? ?Follow Up Recommendations ? Skilled nursing-short term rehab (<3 hours/day)  ?  ?Assistance Recommended at Discharge  Frequent or constant Supervision/Assistance  ?Patient can return home with the following A little help with walking and/or transfers;A little help with bathing/dressing/bathroom;Direct supervision/assist for medications management ? ?  ?Functional Status Assessment ? Patient has had a recent decline in their functional status and demonstrates the ability to make significant improvements in function in a reasonable and predictable amount of time.  ?Equipment Recommendations ?    ?May require new w/c given report he no longer has his   ?   ?Precautions / Restrictions Precautions ?Precautions: Fall ?Precaution Comments: pt with bilateral BKA (chronic) no prosthetics ?Restrictions ?Weight Bearing Restrictions: No  ? ?  ? ?Mobility Bed Mobility ?Overal bed mobility: Independent ?  ?  ?  ?  ?  ?  ?  ?  ? ?Transfers ?Overall transfer level: Needs assistance ?Equipment used: None ?Transfers: Sit to/from Stand (on bed surface progressed to standing on knees with min guard A) ?  ?  ?  ?  ?  ? ?  ?   ? ?ADL either performed or assessed with clinical judgement  ? ?ADL Overall ADL's : Needs assistance/impaired ?Eating/Feeding: Independent ?  ?Grooming: Therapist, nutritional;Set up;Sitting ?  ?Upper Body Bathing: Set up ?  ?Lower Body Bathing: Set up ?  ?Upper Body Dressing : Set up ?  ?Lower Body Dressing: Minimal assistance ?  ?Toilet Transfer: Anterior/posterior;BSC/3in1 ?  ?Toileting- Clothing Manipulation and Hygiene: Minimal assistance ?  ?  ?   ? ? ? ?Vision Baseline Vision/History: 0 No visual deficits ?Patient Visual Report: No change from baseline ?Vision Assessment?: No apparent visual deficits  ?   ?   ?   ? ?  Pertinent Vitals/Pain Pain Assessment ?Pain Assessment: No/denies pain  ? ? ? ?Hand Dominance Right ?  ?Extremity/Trunk Assessment Upper Extremity Assessment ?Upper Extremity Assessment: Overall WFL for tasks assessed ?  ?  ?  ?  ?  ?Communication Communication ?Communication: No difficulties ?  ?Cognition  Arousal/Alertness: Awake/alert ?Behavior During Therapy: [facility] for tasks assessed/performed ?Overall Cognitive Status: No family/caregiver present to determine baseline cognitive functioning ?Area of Impairment: Memory, Attention, Safety/judgement, Awareness, Problem solving ?  ?  ?  ?  ?  ?  ?  ?Current Attention Level: Focused ?Memory: Decreased recall of precautions, Decreased short-term memory ?  ?Safety/Judgement: Decreased awareness of safety, Decreased awareness of deficits ?Awareness: Intellectual ?Problem Solving: Slow processing, Requires verbal cues ?  ?  ?  ?General Comments  callous to B tibial tuberosities, pt able to "stand" on bed surface to quickly adjust linens, impulsive with mobility and despite OT instruction to lean and lift buttocks pt transferred onto residual limbs by pulling on foot board of bed ? ?  ?   ?   ? ? ?Home Living Family/patient expects to be discharged to:: Shelter/Homeless ?  ?  ?  ?  ?  ?  ? ?  ?Prior Functioning/Environment Prior Level of Function : Independent/Modified Independent ?  ?  ?  ?Mobility Comments: pt with conflicting reports, states he mobilized with w/c and then additionally reports ambulating on knees, note callous formations for B tibial tuberosities with L greater than R ?ADLs Comments: reports indep at baseline ?  ? ?  ?  ?OT Problem List: Impaired balance (sitting and/or standing);Decreased safety awareness;Decreased strength ?  ?   ?OT Treatment/Interventions: Self-care/ADL training;DME and/or AE instruction;Patient/family education;Therapeutic activities  ?  ?OT Goals(Current goals can be found in the care plan section) Acute Rehab OT Goals ?Patient Stated Goal: Acquire prosthetics and progress to ambulating ?OT Goal Formulation: With patient ?Time For Goal Achievement: 11/28/21 ?Potential to Achieve Goals: Fair ?ADL Goals ?Pt Will Transfer to Toilet: with modified independence;regular height toilet;with transfer board ?Pt Will Perform Toileting - Clothing  Manipulation and hygiene: with modified independence;sitting/lateral leans  ?OT Frequency: Min 2X/week ?  ? ?   ?AM-PAC OT "6 Clicks" Daily Activity     ?Outcome Measure Help from another person eating meals?: None ?Help from another person taking care of personal grooming?: A Little ?Help from another person toileting, which includes using toliet, bedpan, or urinal?: A Little ?Help from another person bathing (including washing, rinsing, drying)?: A Little ?Help from another person to put on and taking off regular upper body clothing?: A Little ?Help from another person to put on and taking off regular lower body clothing?: A Little ?6 Click Score: 19 ?  ?End of Session   ? ?Activity Tolerance: Patient tolerated treatment well ?Patient left: in bed;with bed alarm set ? ?OT Visit Diagnosis: Unsteadiness on feet (R26.81);Muscle weakness (generalized) (M62.81);Other abnormalities of gait and mobility (R26.89)  ?              ?Time: 9509-3267 ?OT Time Calculation (min): 20 min ?Charges:  OT General Charges ?$OT Visit: 1 Visit ?OT Evaluation ?$OT Eval Low Complexity: 1 Low ? ?Daryl Eastern, OTR/L ?11/14/2021, 5:54 PM ?

## 2021-11-14 NOTE — [place] FL2 (Addendum)
?[place] MEDICAID FL2 LEVEL OF CARE SCREENING TOOL  ?  ? ?IDENTIFICATION  ?Patient Name: ?Cory Palmer Birthdate: 19-Jul-1961 Sex: male Admission Date (Current Location): ?11/11/2021  ?[place] Number: ? [place] ?  Facility and Address:  ?[facility], [address] [place], [place] [address] ?     Provider Number: ?[id number]  ?Attending Physician Name and Address:  ?Rhetta Mura, MD ? Relative Name and Phone Number:  ?Raed, Schalk (Brother)   [phone] ?   ?Current Level of Care: ?Hospital Recommended Level of Care: ?Skilled Nursing Facility Prior Approval Number: ?  ? ?Date Approved/Denied: ?  PASRR Number: ?[id number] E ? ?Discharge Plan: ?SNF ?  ? ?Current Diagnoses: ?Patient Active Problem List  ? Diagnosis Date Noted  ? Acute encephalopathy 11/11/2021  ? Thrombocytopenia (HCC) 11/11/2021  ? Leukocytosis 10/10/2020  ? GERD (gastroesophageal reflux disease) 10/10/2020  ? 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HCC) 05/06/2020  ? Hyponatremia 10/23/2019  ? MRSA bacteremia 10/21/2019  ? Atrial flutter with rapid ventricular response (HCC) 10/21/2019  ? Lactic acidosis 10/21/2019  ? Severe protein-calorie malnutrition (HCC)   ? Diabetic polyneuropathy associated with type 2 diabetes mellitus (HCC)   ? Cirrhosis of liver (HCC) 10/19/2019  ? Anemia 10/19/2019  ? Medication monitoring encounter 08/15/2019  ? Serum total bilirubin elevated 10/29/2018  ? BPH (benign prostatic hyperplasia) 10/29/2018  ? Below-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)   ? PAF (paroxysmal atrial fibrillation) (HCC)   ? Hypokalemia   ? Hypomagnesemia   ? Sepsis without acute organ dysfunction (HCC) 10/05/2018  ? Depressed bipolar disorder (HCC) 10/05/2018  ? Hypertension 10/05/2018  ? Hyperbilirubinemia 10/05/2018  ? Homelessness 05/07/2018  ? Diabetes mellitus type II, non insulin dependent (HCC) 05/07/2018  ? ? ?Orientation RESPIRATION BLADDER Height & Weight   ?  ?Situation,  Place, Self ? Normal Continent Weight: 244 lb 11.4 oz (111 kg) ?Height:  6\' 4"  (193 cm)  ?BEHAVIORAL SYMPTOMS/MOOD NEUROLOGICAL BOWEL NUTRITION STATUS  ?    Continent Diet  ?AMBULATORY STATUS COMMUNICATION OF NEEDS Skin   ?Limited Assist Verbally Normal ?  ?  ?  ?    ?     ?     ? ? ?Personal Care Assistance Level of Assistance  ?Bathing, Feeding, Dressing Bathing Assistance: Limited assistance ?Feeding assistance: Independent ?Dressing Assistance: Limited assistance ?   ? ?Functional Limitations Info  ?Sight, Hearing, Speech Sight Info: Adequate ?Hearing Info: Adequate ?Speech Info: Adequate  ? ? ?SPECIAL CARE FACTORS FREQUENCY  ?PT (By licensed PT), OT (By licensed OT)   ?  ?PT Frequency: 3x a week ?OT Frequency: 3x a week ?  ?  ?  ?   ? ? ?Contractures Contractures Info: Not present  ? ? ?Additional Factors Info  ?Code Status, Allergies Code Status Info: Full ?Allergies Info: NKA ?  ?  ?  ?   ? ?Current Medications (11/14/2021):  This is the current hospital active medication list ?Current Facility-Administered Medications  ?Medication Dose Route Frequency Provider Last Rate Last Admin  ? carvedilol (COREG) tablet 12.5 mg  12.5 mg Oral BID WC Shahmehdi, Seyed A, MD   12.5 mg at 11/14/21 11/16/21  ? haloperidol lactate (HALDOL) injection 2-4 mg  2-4 mg Intravenous Q6H PRN 7672, DO   2 mg at 11/13/21 11/15/21  ? insulin aspart (novoLOG) injection 0-5 Units  0-5 Units Subcutaneous QHS 0947 M, DO      ? insulin aspart (novoLOG) injection 0-9 Units  0-9 Units Subcutaneous TID WC Hillary Bow, DO   1 Units at 11/14/21 1124  ? lactulose (CHRONULAC) 10 GM/15ML solution 20 g  20 g Oral TID Charlsie Quest, MD   20 g at 11/14/21 3149  ? metoprolol tartrate (LOPRESSOR) injection 5 mg  5 mg Intravenous Q4H PRN Shahmehdi, Seyed A, MD      ? multivitamin with minerals tablet 1 tablet  1 tablet Oral Daily Kendell Bane, MD   1 tablet at 11/14/21 0919  ? ondansetron (ZOFRAN) tablet 4 mg  4 mg Oral Q6H PRN  Charlsie Quest, MD      ? Or  ? ondansetron (ZOFRAN) injection 4 mg  4 mg Intravenous Q6H PRN Charlsie Quest, MD      ? QUEtiapine (SEROQUEL) tablet 300 mg  300 mg Oral QHS Shahmehdi, Seyed A, MD   300 mg at 11/13/21 2155  ? sertraline (ZOLOFT) tablet 50 mg  50 mg Oral Daily Shahmehdi, Seyed A, MD   50 mg at 11/14/21 0919  ? sodium chloride flush (NS) 0.9 % injection 3 mL  3 mL Intravenous Q12H Charlsie Quest, MD   3 mL at 11/14/21 0920  ? traZODone (DESYREL) tablet 150 mg  150 mg Oral QHS Shahmehdi, Seyed A, MD   150 mg at 11/13/21 2155  ? ? ? ?Discharge Medications: ?Please see discharge summary for a list of discharge medications. ? ?Relevant Imaging Results: ? ?Relevant Lab Results: ? ? ?Additional Information ?SSN: [id number] ? ?Symone Wynn Banker, LCSWA ? ? ? ? ?

## 2021-11-14 NOTE — TOC Progression Note (Signed)
Transition of Care (TOC) - Progression Note  ? ? ?Patient Details  ?Name: Cory Palmer ?MRN: [id number] ?Date of Birth: [birth year] ? ?Transition of Care (TOC) CM/SW Contact  ?Cory Castilla, Cory Palmer ?Phone Number: [phone] ?11/14/2021, 3:02 PM ? ?Clinical Narrative:    ? ?CSW met with pt to discuss the recommendation of a SNF. Pt was in agreement and states that [place] willing [place] go outside of the [place]. PTA pt was living at [facility] however explains that all his belongings have been removed and that his items are at the [facility]. Pt explains that his prosthetic legs are being stored at a garage and he wants to get them. Pt asked about his insurance and would he need to give up his check. CSW explained that if his [place] is utilized that the first 20 days should be paid at 100%. Pt agreed to his referral to be sent out and did not have a facility choice at this time. Pt stated that his brother is not his contact however it is his sister Tye [place] however he did not know her number. ? ?TOC team will continue to assist with discharge planning needs.  ? ?Expected Discharge Plan: [place] ?Barriers to Discharge: Unsafe home situation ? ?Expected Discharge Plan and Services ?Expected Discharge Plan: [place] ?  ?  ?  ?Living arrangements for the past 2 months: Hotel/Motel ?                ?  ?  ?  ?  ?  ?  ?  ?  ?  ?  ? ? ?Social Determinants of Health (SDOH) Interventions ?  ? ?Readmission Risk Interventions ? ?  10/21/2019  ?  1:04 PM  ?Readmission Risk Prevention Plan  ?Transportation Screening Complete  ?PCP or Specialist Appt within 5-7 Days Complete  ?Home Care Screening Complete  ?Medication Review (RN CM) Complete  ? ? ?

## 2021-11-15 DIAGNOSIS — G9340 Encephalopathy, unspecified: Secondary | ICD-10-CM | POA: Diagnosis not present

## 2021-11-15 LAB — COMPREHENSIVE METABOLIC PANEL
ALT: 15 U/L (ref 0–44)
AST: 26 U/L (ref 15–41)
Albumin: 3.6 g/dL (ref 3.5–5.0)
Alkaline Phosphatase: 89 U/L (ref 38–126)
Anion gap: 7 (ref 5–15)
BUN: 13 mg/dL (ref 8–23)
CO2: 19 mmol/L — ABNORMAL LOW (ref 22–32)
Calcium: 8.8 mg/dL — ABNORMAL LOW (ref 8.9–10.3)
Chloride: 111 mmol/L (ref 98–111)
Creatinine, Ser: 0.85 mg/dL (ref 0.61–1.24)
GFR, Estimated: >60 mL/min (ref >60)
Glucose, Bld: 135 mg/dL — ABNORMAL HIGH (ref 70–99)
Potassium: 3.7 mmol/L (ref 3.5–5.1)
Sodium: 137 mmol/L (ref 135–145)
Total Bilirubin: 1.3 mg/dL — ABNORMAL HIGH (ref 0.3–1.2)
Total Protein: 6.3 g/dL — ABNORMAL LOW (ref 6.5–8.1)

## 2021-11-15 LAB — GLUCOSE, CAPILLARY
Glucose-Capillary: 146 mg/dL — ABNORMAL HIGH (ref 70–99)
Glucose-Capillary: 162 mg/dL — ABNORMAL HIGH (ref 70–99)
Glucose-Capillary: 155 mg/dL — ABNORMAL HIGH (ref 70–99)
Glucose-Capillary: 224 mg/dL — ABNORMAL HIGH (ref 70–99)

## 2021-11-15 LAB — CBC
HCT: 37.2 % — ABNORMAL LOW (ref 39.0–52.0)
Hemoglobin: 12.5 g/dL — ABNORMAL LOW (ref 13.0–17.0)
MCH: 31 pg (ref 26.0–34.0)
MCHC: 33.6 g/dL (ref 30.0–36.0)
MCV: 92.3 fL (ref 80.0–100.0)
Platelets: 105 10*3/uL — ABNORMAL LOW (ref 150–400)
RBC: 4.03 MIL/uL — ABNORMAL LOW (ref 4.22–5.81)
RDW: 15.6 % — ABNORMAL HIGH (ref 11.5–15.5)
WBC: 6.2 10*3/uL (ref 4.0–10.5)
nRBC: 0 % (ref 0.0–0.2)

## 2021-11-15 LAB — PROTIME-INR
INR: 1.2 (ref 0.8–1.2)
Prothrombin Time: 14.7 seconds (ref 11.4–15.2)

## 2021-11-15 LAB — AMMONIA: Ammonia: 40 umol/L — ABNORMAL HIGH (ref 9–35)

## 2021-11-15 MED ORDER — TRAZODONE HCL 50 MG PO TABS
150.0000 mg | ORAL_TABLET | Freq: Every day | ORAL | Status: DC
  Filled 2021-11-15: qty 1

## 2021-11-15 MED ORDER — LORAZEPAM 2 MG/ML IJ SOLN
1.0000 mg | INTRAMUSCULAR | Status: DC | PRN
  Administered 2021-11-15 – 2021-11-16 (×3): 1 mg via INTRAVENOUS
  Filled 2021-11-15 (×3): qty 1

## 2021-11-15 MED ORDER — NICOTINE 21 MG/24HR TD PT24
21.0000 mg | MEDICATED_PATCH | Freq: Every day | TRANSDERMAL | Status: DC
  Administered 2021-11-15 – 2021-11-18 (×4): 21 mg via TRANSDERMAL
  Filled 2021-11-15 (×4): qty 1

## 2021-11-15 NOTE — TOC Progression Note (Addendum)
Transition of Care (TOC) - Progression Note  ? ? ?Patient Details  ?Name: Cory Palmer ?MRN: [id number] ?Date of Birth: 1961/01/19 ? ?Transition of Care (TOC) CM/SW Contact  ?Delilah Shan, LCSWA ?Phone Number: ?11/15/2021, 1:37 PM ? ?Clinical Narrative:    ? ? ?Update- 4:36pm- Insurance authorization currently pending. CSW will continue to follow. ? ? ?CSW spoke with patient at bedside. Patient reports he comes from [place] hotel. Patient reports he does receive a monthly income and that his plan after rehab is to return back to hotel. Patient reports his regular wheelchair is at [facility]. Patient reports [facility] helped assist with finding current hotel that he is staying in. CSW provided patient with SNF bed offer. Patient accepted SNF bed offer with [facility]. CSW spoke with Delorise Shiner with [facility] who confirmed SNF bed offer for patient. Delorise Shiner with [facility] confirmed SNF can assist with getting in contact with [facility] to assist with helping patient get his wheelchair back when he is ready to dc from rehab.CSW started insurance authorization for patient reference number [id number].CSW will continue to follow and assist with patients dc planning needs. ? ?Expected Discharge Plan: Skilled Nursing Facility ?Barriers to Discharge: Unsafe home situation ? ?Expected Discharge Plan and Services ?Expected Discharge Plan: Skilled Nursing Facility ?  ?  ?  ?Living arrangements for the past 2 months: Hotel/Motel ?                ?  ?  ?  ?  ?  ?  ?  ?  ?  ?  ? ? ?Social Determinants of Health (SDOH) Interventions ?  ? ?Readmission Risk Interventions ? ?  10/21/2019  ?  1:04 PM  ?Readmission Risk Prevention Plan  ?Transportation Screening Complete  ?PCP or Specialist Appt within 5-7 Days Complete  ?Home Care Screening Complete  ?Medication Review (RN CM) Complete  ? ? ?

## 2021-11-15 NOTE — TOC Progression Note (Addendum)
Transition of Care (TOC) - Progression Note  ? ? ?Patient Details  ?Name: Cory Palmer ?MRN: [id number] ?Date of Birth: 05-30-1961 ? ?Transition of Care (TOC) CM/SW Contact  ?Delilah Shan, LCSWA ?Phone Number: ?11/15/2021, 10:21 AM ? ?Clinical Narrative:    ? ?Update- [facility] Pass[facility] number has been approved. Pending SNF bed offers. Will need insurance auth. ? ?CSW faxed out patient for possible SNF placement. Patients Passr is currently pending. CSW submitted requested clinicals to [place] must for determination on passr. Patients SNF bed offers currently pending. CSW will continue to follow and assist with patients dc planning needs. ? ?Expected Discharge Plan: Skilled Nursing Facility ?Barriers to Discharge: Unsafe home situation ? ?Expected Discharge Plan and Services ?Expected Discharge Plan: Skilled Nursing Facility ?  ?  ?  ?Living arrangements for the past 2 months: Hotel/Motel ?                ?  ?  ?  ?  ?  ?  ?  ?  ?  ?  ? ? ?Social Determinants of Health (SDOH) Interventions ?  ? ?Readmission Risk Interventions ? ?  10/21/2019  ?  1:04 PM  ?Readmission Risk Prevention Plan  ?Transportation Screening Complete  ?PCP or Specialist Appt within 5-7 Days Complete  ?Home Care Screening Complete  ?Medication Review (RN CM) Complete  ? ? ?

## 2021-11-15 NOTE — Progress Notes (Addendum)
RE: Cory Palmer. [place] ? ?Date of Birth: 1960/09/27 ? ?Date: 11/15/2021 ? ?To Whom It May Concern: ? ?Please be advised that the above-named patient will require a short-term nursing home stay - anticipated 30 days or less for rehabilitation and strengthening. The plan is for return home. ?

## 2021-11-15 NOTE — Progress Notes (Signed)
?PROGRESS NOTE ? ? 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 ?Prior hospitalization severe esophagitis last admitted 3/12 through 10/20/2021-EGD at the time showed candidiasis grade D reflux and erosions-he was started on Carafate Protonix and fluconazole ? ?Was admitted 11/11/2021 toxic metabolic encephalopathy and found down in hotel room unable to provide history ? ?WBC 6.1 platelets 88 ammonia 160 CK 48 potassium 3.3 ? ?Hospital-Problem based course ? 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 ?Continue lactulose 20 3 times daily mentation has improved-stop checking ammonia ?MRI brain no specific issue other than cirrhosis related findings ?Severe PAD status post bilateral BKA's prior MSSA bacteremia ?Not on aspirin/Plavix presumably because of thrombocytopenia ?His BKA stumps looks clean ?A-fib CHA2DS2-VASc >3 not on anticoagulation 2/2 thrombocytopenia ?Has bled score >3 ?Hemoglobin to be checked in morning ?Mild metabolic acidosis ?Mild hypokalemia on admission ?Improved ?Cirrhosis liver, MELD-Na on admit 12 ?Patient has been counseled to stop drinking ?Continue Coreg 12.5 twice daily may select nonselective beta-blocker going forward ?A1c recently 6 ?Unclear if will be compliant-metformin was added last hospitalization ?OP re-eval 3 mo c A1c, not sure that he will be compliant with glycemic control  ?Bipolar ?Cut back Seroquel to 100 at bedtime-stop trazodone stop Haldol ?Can continue Zoloft 50 every morning ?Recent hospitalization severe  candidal esophagitis on EGD ?Continue Protonix 40 twice daily and consider outpatient scope-continue Carafate 1 g 3 times daily ?Homelessness affecting care ?Noncompliance with medications ?Patient lost his [employer] power scooter outside the [employer] [address] ?TOC to see if they can assist ? ?DVT prophylaxis: SCD ?Code Status: Full ?Family Communication: None ?Disposition:  ?Status is: Inpatient ?Remains inpatient appropriate because:  ? ?Sick ?Not ready for discharge adjusting meds and needs safe discharge plan ? ?Subjective: ? ?No distress--redirectable--ate breakfast ? ?Objective: ?Vitals:  ? 11/14/21 1443 11/14/21 2315 11/15/21 0404 11/15/21 0757  ?BP: (!) 102/53 (!) 112/57 126/71 130/76  ?Pulse:   68 70  ?Resp: 20 18 12 15   ?Temp: 98.2 ?F (36.8 ?C)  97.6 ?F (36.4 ?C) 97.6 ?F (36.4 ?C)  ?TempSrc: Oral  Oral Oral  ?SpO2:   96% 95%  ?Weight:   111 kg   ?Height:      ? ? ?Intake/Output Summary (Last 24 hours) at 11/15/2021 1715 ?Last data filed at 11/15/2021 0553 ?Gross per 24 hour  ?Intake --  ?Output 600 ml  ?Net -600 ml  ? ? ?Filed Weights  ? 11/11/21 2002 11/14/21 0403 11/15/21 0404  ?Weight: 99.8 kg 111 kg 111 kg  ? ? ?Examination: ? ?Eomi ncat --no asterixis ?Cta b  ?Abd soft nt nd no rebound ?BKA bilaterally ?Neuro intact ? ?Data Reviewed: personally reviewed  ? ?CBC ?   ?Component Value Date/Time  ? WBC 6.2 11/15/2021 0449  ? RBC 4.03 (L) 11/15/2021 0449  ? HGB 12.5 (L) 11/15/2021 0449  ? HCT 37.2 (L) 11/15/2021 0449  ? PLT 105 (L) 11/15/2021 0449  ? MCV 92.3 11/15/2021 0449  ? MCH 31.0 11/15/2021 0449  ? MCHC 33.6 11/15/2021 0449  ? RDW 15.6 (H) 11/15/2021 0449  ? LYMPHSABS 0.9 11/11/2021 2045  ? MONOABS 0.7 11/11/2021 2045  ? EOSABS  0.2 11/11/2021 2045  ? BASOSABS 0.0 11/11/2021 2045  ? ? ?  Latest Ref Rng & Units 11/15/2021  ?  4:49 AM 11/12/2021  ?  6:56 AM 11/11/2021  ?  8:45 PM  ?CMP  ?Glucose 70 - 99 mg/dL 135   152   194    ?BUN 8 - 23 mg/dL 13   12   12     ?Creatinine 0.61 - 1.24 mg/dL 0.85    0.80   0.94    ?Sodium 135 - 145 mmol/L 137   139   141    ?Potassium 3.5 - 5.1 mmol/L 3.7   3.9   3.3    ?Chloride 98 - 111 mmol/L 111   115   114    ?CO2 22 - 32 mmol/L 19   18   15     ?Calcium 8.9 - 10.3 mg/dL 8.8   9.2   10.0    ?Total Protein 6.5 - 8.1 g/dL 6.3   6.3   7.2    ?Total Bilirubin 0.3 - 1.2 mg/dL 1.3   1.6   1.5    ?Alkaline Phos 38 - 126 U/L 89   94   104    ?AST 15 - 41 U/L 26   43   29    ?ALT 0 - 44 U/L 15   15   21     ? ? ? ?Radiology Studies: ?No results found. ? ? ?Scheduled Meds: ? carvedilol  12.5 mg Oral BID WC  ? insulin aspart  0-5 Units Subcutaneous QHS  ? insulin aspart  0-9 Units Subcutaneous TID WC  ? lactulose  20 g Oral TID  ? multivitamin with minerals  1 tablet Oral Daily  ? nicotine  21 mg Transdermal Daily  ? pantoprazole  40 mg Oral BID  ? QUEtiapine  100 mg Oral QHS  ? sertraline  50 mg Oral Daily  ? sodium chloride flush  3 mL Intravenous Q12H  ? sucralfate  1 g Oral TID WC & HS  ? ?Continuous Infusions: ? ? LOS: 3 days  ? ?Time spent: 25 ? ?Nita Sells, MD ?[facility] ?To contact the attending provider between 7A-7P or the covering provider during after hours 7P-7A, please log into the web site [email or web address] and access using universal [place] password for that web site. If you do not have the password, please call the hospital operator. ? ?11/15/2021, 5:15 PM  ? ? ?

## 2021-11-15 NOTE — Care Management Important Message (Signed)
Important Message ? ?Patient Details  ?Name: Cory Palmer ?MRN: [id number] ?Date of Birth: Nov 10, 1960 ? ? ?Medicare Important Message Given:  Yes ? ? ? ? ?Renie Ora ?11/15/2021, 10:32 AM ?

## 2021-11-16 DIAGNOSIS — G9340 Encephalopathy, unspecified: Secondary | ICD-10-CM | POA: Diagnosis not present

## 2021-11-16 LAB — COMPREHENSIVE METABOLIC PANEL
ALT: 15 U/L (ref 0–44)
AST: 22 U/L (ref 15–41)
Albumin: 3.8 g/dL (ref 3.5–5.0)
Alkaline Phosphatase: 105 U/L (ref 38–126)
Anion gap: 11 (ref 5–15)
BUN: 8 mg/dL (ref 8–23)
CO2: 20 mmol/L — ABNORMAL LOW (ref 22–32)
Calcium: 9 mg/dL (ref 8.9–10.3)
Chloride: 105 mmol/L (ref 98–111)
Creatinine, Ser: 0.69 mg/dL (ref 0.61–1.24)
GFR, Estimated: >60 mL/min (ref >60)
Glucose, Bld: 132 mg/dL — ABNORMAL HIGH (ref 70–99)
Potassium: 3.9 mmol/L (ref 3.5–5.1)
Sodium: 136 mmol/L (ref 135–145)
Total Bilirubin: 1.7 mg/dL — ABNORMAL HIGH (ref 0.3–1.2)
Total Protein: 7 g/dL (ref 6.5–8.1)

## 2021-11-16 LAB — CBC
HCT: 39.8 % (ref 39.0–52.0)
Hemoglobin: 13.8 g/dL (ref 13.0–17.0)
MCH: 31.5 pg (ref 26.0–34.0)
MCHC: 34.7 g/dL (ref 30.0–36.0)
MCV: 90.9 fL (ref 80.0–100.0)
Platelets: 126 10*3/uL — ABNORMAL LOW (ref 150–400)
RBC: 4.38 MIL/uL (ref 4.22–5.81)
RDW: 15.1 % (ref 11.5–15.5)
WBC: 6.6 10*3/uL (ref 4.0–10.5)
nRBC: 0 % (ref 0.0–0.2)

## 2021-11-16 LAB — GLUCOSE, CAPILLARY
Glucose-Capillary: 169 mg/dL — ABNORMAL HIGH (ref 70–99)
Glucose-Capillary: 196 mg/dL — ABNORMAL HIGH (ref 70–99)
Glucose-Capillary: 169 mg/dL — ABNORMAL HIGH (ref 70–99)
Glucose-Capillary: 120 mg/dL — ABNORMAL HIGH (ref 70–99)

## 2021-11-16 LAB — AMMONIA: Ammonia: 44 umol/L — ABNORMAL HIGH (ref 9–35)

## 2021-11-16 MED ORDER — ZIPRASIDONE MESYLATE 20 MG IM SOLR
10.0000 mg | INTRAMUSCULAR | Status: DC | PRN
  Administered 2021-11-16: 10 mg via INTRAMUSCULAR
  Filled 2021-11-16 (×3): qty 20

## 2021-11-16 MED ORDER — LACTULOSE 10 GM/15ML PO SOLN
30.0000 g | Freq: Three times a day (TID) | ORAL | Status: DC
  Administered 2021-11-16 – 2021-11-18 (×8): 30 g via ORAL
  Filled 2021-11-16 (×8): qty 45

## 2021-11-16 MED ORDER — HALOPERIDOL LACTATE 5 MG/ML IJ SOLN
2.0000 mg | Freq: Four times a day (QID) | INTRAMUSCULAR | Status: DC | PRN
  Administered 2021-11-16: 2 mg via INTRAVENOUS
  Filled 2021-11-16: qty 1

## 2021-11-16 MED ORDER — OLANZAPINE 5 MG PO TBDP
5.0000 mg | ORAL_TABLET | Freq: Every day | ORAL | Status: DC
  Administered 2021-11-16: 5 mg via ORAL
  Filled 2021-11-16: qty 1

## 2021-11-16 MED ORDER — QUETIAPINE FUMARATE 50 MG PO TABS
100.0000 mg | ORAL_TABLET | Freq: Three times a day (TID) | ORAL | Status: DC
  Administered 2021-11-16 – 2021-11-18 (×8): 100 mg via ORAL
  Filled 2021-11-16 (×9): qty 2

## 2021-11-16 MED ORDER — QUETIAPINE FUMARATE 50 MG PO TABS: 300.0000 mg | ORAL_TABLET | Freq: Every day | ORAL | Status: DC

## 2021-11-16 MED ORDER — RIFAXIMIN 550 MG PO TABS
550.0000 mg | ORAL_TABLET | Freq: Two times a day (BID) | ORAL | Status: DC
  Administered 2021-11-16 – 2021-11-18 (×6): 550 mg via ORAL
  Filled 2021-11-16 (×6): qty 1

## 2021-11-16 NOTE — Progress Notes (Signed)
?   11/16/21 0740  ?What Happened  ?Was fall witnessed? Yes  ?Who witnessed fall? Sharee Pimple RN, Yoko RN, Chief Operating Officer, Demetria NT, [place] NT, Raneem NT  ?Patients activity before fall other (comment) ?(Pt was combative hitting the NT when she was trying to keep him in the bed, he was walking on stumps)  ?Point of contact buttocks  ?Was patient injured? No  ?Follow Up  ?MD notified Dr. Verlon Au  ?Time MD notified [phone]  ?Family notified No - patient refusal  ?Additional tests No  ?Progress note created (see row info) Yes  ?Adult Fall Risk Assessment  ?Risk Factor Category (scoring not indicated) High fall risk per protocol (document High fall risk)  ?Age 61  ?Fall History: Fall within 6 months prior to admission 5  ?Elimination; Bowel and/or Urine Incontinence 0  ?Elimination; Bowel and/or Urine Urgency/Frequency 0  ?Medications: includes PCA/Opiates, Anti-convulsants, Anti-hypertensives, Diuretics, Hypnotics, Laxatives, Sedatives, and Psychotropics 5  ?Patient Care Equipment 0  ?Mobility-Assistance 2  ?Mobility-Gait 2  ?Mobility-Sensory Deficit 0  ?Altered awareness of immediate physical environment 1  ?Impulsiveness 2  ?Lack of understanding of one's physical/cognitive limitations 4  ?Total Score 22  ?Patient Fall Risk Level High fall risk  ?Adult Fall Risk Interventions  ?Required Bundle Interventions *See Row Information* High fall risk - low, moderate, and high requirements implemented  ?Additional Interventions Use of appropriate toileting equipment (bedpan, BSC, etc.)  ?Screening for Fall Injury Risk (To be completed on HIGH fall risk patients) - Assessing Need for Floor Mats  ?Risk For Fall Injury- Criteria for Floor Mats Noncompliant with safety precautions  ?Neurological  ?Neuro (WDL) X  ?Level of Consciousness Alert  ?Orientation Level Oriented to person;Oriented to place  ?Cognition Poor attention/concentration;Poor judgement;Poor safety awareness  ?Speech Clear  ?Neuro Symptoms Forgetful;Combative;Agitation   ?Musculoskeletal  ?Musculoskeletal (WDL) X  ?Generalized Weakness Yes  ?Musculoskeletal Details  ?RLE BKA  ?LLE BKA  ? ? ?

## 2021-11-16 NOTE — Progress Notes (Signed)
PT Cancellation Note ? ?Patient Details ?Name: Cory Palmer ?MRN: [id number] ?DOB: 1960/08/25 ? ? ?Cancelled Treatment:    Reason Eval/Treat Not Completed: Other (comment) Per RN, holding therapy today as pt with aggressive behavior earlier and not appropriate at this time. Will follow. ? ? ?Cory Palmer ?11/16/2021, 12:50 PM ?[place]ale [place], PT, DPT ?Acute Rehabilitation Services ?Secure chat preferred ?Office [phone] ? ? ? ?

## 2021-11-16 NOTE — Progress Notes (Addendum)
?PROGRESS NOTE ? ? 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 ?Prior hospitalization severe esophagitis last admitted 3/12 through 10/20/2021-EGD at the time showed candidiasis grade D reflux and erosions-he was started on Carafate Protonix and fluconazole ? ?Was admitted [id number] toxic metabolic encephalopathy and found down in hotel room unable to provide history ? ?WBC 6.1 platelets 88 ammonia 160 CK 48 potassium 3.3 ? ?Hospital-Problem based course ? 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 ?Increase lactulose to 30 3 times daily start rifaximin -prognosis is guarded because of his delirium superimposed on chronic cirrhosis ?MRI brain no specific issue other than cirrhosis related findings ?Severe PAD status post bilateral BKA's prior MSSA bacteremia ?Not on aspirin/Plavix presumably because of thrombocytopenia ?His BKA stumps looks clean ?A-fib CHA2DS2-VASc >3 not on anticoagulation 2/2 thrombocytopenia ?Has bled score >3 ?Hemoglobin to be checked in morning ?Mild metabolic acidosis ?Mild hypokalemia on admission ?Improved ?Cirrhosis liver, MELD-Na on admit 12 ?Patient has been counseled to stop drinking ?Continue Coreg 12.5 twice daily may select nonselective beta-blocker going forward ?A1c recently 6 ?Unclear if will be compliant-metformin was added last hospitalization ?OP re-eval 3 mo c A1c, not sure that he will be compliant with glycemic control  ?Sliding scale for now-depending on mentation may be able to decide on oral agent for diabetes  Seroquel 300 every morning ?Bipolar ?Discussed with psychiatry 4/18 a.m. I explained the scenario ?Given Geodon IM 10 mg this morning and Zydis 5 ?--As he is still exhibiting difficult behavior we will start Seroquel but use 100 3 times daily-we can escalate to Zydis again but psychiatry recommends to start with Seroquel in this way-they are available for consult if needed and have not formally seen the patient ?Resumed IV Haldol 2 mg every 6 as needed for severe agitation ?Can continue Zoloft 50 every morning ?Recent hospitalization severe candidal esophagitis on EGD ?Continue Protonix 40 twice daily and consider outpatient scope-continue Carafate 1 g 3 times daily ?Homelessness affecting care ?Noncompliance with medications ?Patient lost his [employer] power scooter outside the [employer] [address] ?TOC to see if they can assist ? ?DVT prophylaxis: SCD ?Code Status: Full ?Family Communication: None ?Disposition:  ?Status is: Inpatient ?Remains inpatient appropriate because:  ? ?Patient is encephalopathic confused and not ready for discharge at this time from slurred speech when I saw him he was quite ? ?Subjective: ? ?Events overnight noted-despite bilateral BKA's got out of bed hit nursing staff and abdomen-he remains confused-relates fantastic stories about various things which do not make sense he has slightly slurred speech as well ?He is redirectable but security had to be called to the bedside ?We will be giving Haldol 2 mg now and subsequently he will probably need some Geodon if he continues to be altered ?His ammonia was not terribly elevated yesterday however given events overnight we will increase to 30 3 times daily and start rifaximin additionally-(I do not know if this would be affordable long-term in the outpatient setting) ? ?Objective: ?Vitals:  ? 11/15/21 0404 11/15/21 0757 11/15/21 2054 11/16/21 0334  ?BP: 126/71 130/76 (!) 146/81 133/80  ?Pulse: 68 70 69 67  ?Resp: 12 15 20  17  ?Temp: 97.6  ?F (36.4 ?C) 97.6 ?F (36.4 ?C) (!) 97.5 ?F (36.4 ?C) 97.7 ?F (36.5 ?C)  ?TempSrc: Oral Oral Oral Oral  ?SpO2: 96% 95% 99% 99%  ?Weight: 111 kg   108.1 kg  ?Height:      ? ? ?Intake/Output Summary (Last 24 hours) at 11/16/2021 0803 ?Last data filed at 11/16/2021 0506 ?Gross per 24 hour  ?Intake --  ?Output 3345 ml  ?Net -3345 ml  ? ? ?Filed Weights  ? 11/14/21 0403 11/15/21 0404 11/16/21 0334  ?Weight: 111 kg 111 kg 108.1 kg  ? ? ?Examination: ? ?Eomi ncat --slurred speech when I saw him-he was quite confused and apparently was combative with security at the bedside when I saw him ?Cta b no added sound no wheeze ?S1-S2 no murmur no rub no gallop ?Abd soft nt nd no rebound ?BKA bilaterally ?Neuro intact ? ?Data Reviewed: personally reviewed  ? ?CBC ?   ?Component Value Date/Time  ? WBC 6.6 11/16/2021 0431  ? RBC 4.38 11/16/2021 0431  ? HGB 13.8 11/16/2021 0431  ? HCT 39.8 11/16/2021 0431  ? PLT 126 (L) 11/16/2021 0431  ? MCV 90.9 11/16/2021 0431  ? MCH 31.5 11/16/2021 0431  ? MCHC 34.7 11/16/2021 0431  ? RDW 15.1 11/16/2021 0431  ? LYMPHSABS 0.9 11/11/2021 2045  ? MONOABS 0.7 11/11/2021 2045  ? EOSABS 0.2 11/11/2021 2045  ? BASOSABS 0.0 11/11/2021 2045  ? ? ?  Latest Ref Rng & Units 11/16/2021  ?  4:31 AM 11/15/2021  ?  4:49 AM 11/12/2021  ?  6:56 AM  ?CMP  ?Glucose 70 - 99 mg/dL 132   135   152    ?BUN 8 - 23 mg/dL 8   13   12     ?Creatinine 0.61 - 1.24 mg/dL 0.69   0.85   0.80    ?Sodium 135 - 145 mmol/L 136   137   139    ?Potassium 3.5 - 5.1 mmol/L 3.9   3.7   3.9    ?Chloride 98 - 111 mmol/L 105   111   115    ?CO2 22 - 32 mmol/L 20   19   18     ?Calcium 8.9 - 10.3 mg/dL 9.0   8.8   9.2    ?Total Protein 6.5 - 8.1 g/dL 7.0   6.3   6.3    ?Total Bilirubin 0.3 - 1.2 mg/dL 1.7   1.3   1.6    ?Alkaline Phos 38 - 126 U/L 105   89   94    ?AST 15 - 41 U/L 22   26   43    ?ALT 0 - 44 U/L 15   15   15     ? ? ? ?Radiology Studies: ?No results found. ? ? ?Scheduled Meds: ? carvedilol  12.5 mg Oral BID WC  ? insulin aspart  0-5  Units Subcutaneous QHS  ? insulin aspart  0-9 Units Subcutaneous TID WC  ? lactulose  30 g Oral TID  ? multivitamin with minerals  1 tablet Oral Daily  ? nicotine  21 mg Transdermal Daily  ? pantoprazole  40 mg Oral BID  ? QUEtiapine  100 mg Oral TID  ? rifaximin  550 mg Oral BID  ? sertraline  50 mg Oral Daily  ? sodium chloride flush  3 mL Intravenous Q12H  ? sucralfate  1 g Oral TID WC & HS  ? ?Continuous Infusions: ? ?  LOS: 4 days  ? ?Time spent: 20 ? ?Nita Sells, MD ?[facility] ?To contact the attending provider between 7A-7P or the covering provider during after hours 7P-7A, please log into the web site [email or web address] and access using universal [place] password for that web site. If you do not have the password, please call the hospital operator. ? ?11/16/2021, 8:03 AM  ? ? ?

## 2021-11-16 NOTE — Progress Notes (Signed)
Pt with confusion overnight and trying to get out of bed.  Pt was redirectable most of the shift.  At 515 pt attempted to get out of bed to go to the shower.  Pt became aggressive with staff.  Security called to bedside multiple times for assistance.  As soon as security would leave department, pt would attempt to get out of bed and pushing staff.  Notified Dr. Julian Reil of patient status and order received for waist belt restraint.  Pt placed in waist belt restraint with assistance from security to get back in bed.  At change of shift, pt continued to get out of waist belt restraint, onto floor on his BKA stumps, pushing staff out of the way and hitting staff as he proceeded to try to leave. Security called to bedside again and Dr. Mahala Menghini notified with new orders and will be up to bedside. ?

## 2021-11-16 NOTE — Progress Notes (Signed)
Pt. Is confused & wanted to go home & attempted to get OOB  & agitated  & removing heart monitor & trying to push staff.Security was called for assistance & Ativan 1mg  IV given & was able to convinced to get back in bed. ?

## 2021-11-16 NOTE — TOC Progression Note (Addendum)
Transition of Care (TOC) - Progression Note  ? ? ?Patient Details  ?Name: Cory Palmer ?MRN: [id number] ?Date of Birth: Jan 13, 1961 ? ?Transition of Care (TOC) CM/SW Contact  ?Milas Gain, LCSWA ?Phone Number: ?11/16/2021, 11:22 AM ? ?Clinical Narrative:    ? ? ?CSW spoke with Shirlee Limerick at [facility] regarding SNF bed for patient. DON at [facility] is going to relook at patient to determine if they can still offer a SNF bed for patient due to patients recent behaviors. Shirlee Limerick request for CSW to follow back up with her tomorrow on determination. Patients insurance authorization has been approved [id number] [facility] ID# [id number]. Insurance authorization approved from 4/18-4/20.  CSW was informed by CM that April from [facility] called regarding patient.Patient has given CSW permission to speak with the [facility].CSW returned call and LVM. CSW awaiting callback. CSW will continue to follow and assist with patients dc planning needs. ? ?Expected Discharge Plan: [place] ?Barriers to Discharge: Unsafe home situation ? ?Expected Discharge Plan and Services ?Expected Discharge Plan: [place] ?  ?  ?  ?Living arrangements for the past 2 months: Hotel/Motel ?                ?  ?  ?  ?  ?  ?  ?  ?  ?  ?  ? ? ?Social Determinants of Health (SDOH) Interventions ?  ? ?Readmission Risk Interventions ? ?  10/21/2019  ?  1:04 PM  ?Readmission Risk Prevention Plan  ?Transportation Screening Complete  ?PCP or Specialist Appt within 5-7 Days Complete  ?Home Care Screening Complete  ?Medication Review (RN CM) Complete  ? ? ?

## 2021-11-17 DIAGNOSIS — E872 Acidosis, unspecified: Secondary | ICD-10-CM

## 2021-11-17 DIAGNOSIS — I739 Peripheral vascular disease, unspecified: Secondary | ICD-10-CM

## 2021-11-17 DIAGNOSIS — K209 Esophagitis, unspecified without bleeding: Secondary | ICD-10-CM

## 2021-11-17 DIAGNOSIS — K7682 Hepatic encephalopathy: Secondary | ICD-10-CM

## 2021-11-17 DIAGNOSIS — G9341 Metabolic encephalopathy: Secondary | ICD-10-CM

## 2021-11-17 HISTORY — DX: Hepatic encephalopathy: K76.82

## 2021-11-17 LAB — GLUCOSE, CAPILLARY
Glucose-Capillary: 131 mg/dL — ABNORMAL HIGH (ref 70–99)
Glucose-Capillary: 266 mg/dL — ABNORMAL HIGH (ref 70–99)
Glucose-Capillary: 137 mg/dL — ABNORMAL HIGH (ref 70–99)
Glucose-Capillary: 260 mg/dL — ABNORMAL HIGH (ref 70–99)

## 2021-11-17 LAB — AMMONIA: Ammonia: 67 umol/L — ABNORMAL HIGH (ref 9–35)

## 2021-11-17 MED ORDER — TRAZODONE HCL 50 MG PO TABS
50.0000 mg | ORAL_TABLET | Freq: Every day | ORAL | Status: DC
Start: 2021-11-17 — End: 2021-11-19
  Administered 2021-11-17 – 2021-11-18 (×2): 50 mg via ORAL
  Filled 2021-11-17 (×2): qty 1

## 2021-11-17 NOTE — Assessment & Plan Note (Signed)
-  Continue Coreg 

## 2021-11-17 NOTE — Progress Notes (Signed)
?  Progress Note ? ? ?Patient: Cory Palmer [id number] DOB: 1960/08/18 DOA: 11/11/2021     5 ?DOS: the patient was seen and examined on 11/17/2021 at 1035 ?  ? ? ? ?Brief hospital course: ?Mr. Coger is a 61 y.o. M with pAF not on AC, alcohol use disorder and alcoholic cirrhosis, Bipolar disorder, and esophagitis who presented with confusion. ? ?Evidently found in a hotel room, unable to provide history.   ? ? ? ? ?Assessment and Plan: ?* Acute metabolic encephalopathy ?Seems at baseline ?Ammonia down to 60s ?-Continue lactulose ?-Continue rifaximin ? ?Cirrhosis of liver (HCC) ?  ? ?PAF (paroxysmal atrial fibrillation) (HCC) ?-Continue Coreg ?- not on AC ? ? ?Diabetes mellitus type II, non insulin dependent (HCC) ?-Hold metformin  ?-Continue SS corrections ? ?Thrombocytopenia (HCC) ?Due to cirrhosis ? ?Esophagitis ?-Continue PPI, sucralfate ? ?Metabolic acidosis ?  ? ?PVD (peripheral vascular disease) (HCC) ?-Continue Coreg ? ?Acute hepatic encephalopathy (HCC) ?See above ? ?Severe protein-calorie malnutrition (HCC) ?  ? ?Below-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) ?Bilateral amputee ? ?Hypokalemia ?Supplemented ? ?Depressed bipolar disorder (HCC) ?-Continue Seroquel, Zoloft and trazodone ? ?Homelessness ?- Consult TOC ? ? ? ? ? ? ? ? ? ?Subjective: No complaints. ? ? ? ? ?Physical Exam: ?Vitals:  ? 11/16/21 1512 11/16/21 2213 11/17/21 0746 11/17/21 1701  ?BP:  101/69 104/60 (!) 106/58  ?Pulse:  62 69 66  ?Resp: 20 18 18 16   ?Temp: (!) 97.4 ?F (36.3 ?C) 98.3 ?F (36.8 ?C) 97.8 ?F (36.6 ?C) (!) 97.4 ?F (36.3 ?C)  ?TempSrc: Axillary Oral Oral Axillary  ?SpO2:  97% 93% 95%  ?Weight:      ?Height:      ? ?Adult male, lying in bed, no acute distress, interactive ?RRR, no murmurs, no peripheral edema ?Lung sounds clear, no rales or wheezes ?Bilateral amputee ?Attention slightly diminished, affect blunted, judgment insight appear normal ? ?Data Reviewed: ?Nursing notes reviewed, vital signs reviewed ?Ammonia down to  67 ?Glucose normal ?Complete metabolic panel yesterday normal ?Complete blood count normal ? ?Family Communication:   ? ? ? ?Disposition: ?Status is: Inpatient ? ? ? ? ? ? ? ? ?Author: ? , MD ?11/17/2021 6:09 PM ? ?For on call review [email or web address].  ? ? ?

## 2021-11-17 NOTE — Progress Notes (Signed)
Physical Therapy Treatment ?Patient Details ?Name: Cory Palmer ?MRN: [id number] ?DOB: January 12, 1961 ?Today's Date: 11/17/2021 ? ? ?History of Present Illness The pt is a 61 yo male presenting 4/13 after being found down by [employer]. Upon work up, pt with AMS, concern for hepatic encephalopathy due to known liver cirrhosis. PMH includes: afib (not adherent to medications), hepatic cirrhosis, DM II, bipolar disorder with depression and anxiety, and bilateral BKA. ? ?  ?PT Comments  ? ? Pt admitted with above diagnosis. Pt was able to scoot laterally and anterior/posterior to chair with supervision after setup. Pt progressing. Continues to be intermittently confused. Pt able to complete exercises as well.  Pt currently with functional limitations due to balance and endurance deficits. Pt will benefit from skilled PT to increase their independence and safety with mobility to allow discharge to the venue listed below.      ?Recommendations for follow up therapy are one component of a multi-disciplinary discharge planning process, led by the attending physician.  Recommendations may be updated based on patient status, additional functional criteria and insurance authorization. ? ?Follow Up Recommendations ? Skilled nursing-short term rehab (<3 hours/day) ?  ?  ?Assistance Recommended at Discharge Set up Supervision/Assistance  ?Patient can return home with the following A little help with walking and/or transfers;Assistance with cooking/housework;Direct supervision/assist for medications management;Assist for transportation;Help with stairs or ramp for entrance ?  ?Equipment Recommendations ? None recommended by PT  ?  ?Recommendations for Other Services   ? ? ?  ?Precautions / Restrictions Precautions ?Precautions: Fall ?Precaution Comments: pt with bilateral BKA (chronic) no prosthetics ?Restrictions ?Weight Bearing Restrictions: No  ?  ? ?Mobility ? Bed Mobility ?Overal bed mobility: Independent ?  ?  ?  ?  ?  ?  ?   ?  ? ?Transfers ?Overall transfer level: Modified independent ?Equipment used: None ?Transfers: Bed to chair/wheelchair/BSC ?  ?  ?  ?  ?  ? Lateral/Scoot Transfers: Supervision ?General transfer comment: pt transfered bed to chair lateral positioning of the chair and pt using an anterior approach to transfer. pt transfered bakc to bed. pt then with chair placed for posterior transfer. pt able to complete both ways. Pt supervision after set up of chair. ?  ? ?Ambulation/Gait ?  ?  ?  ?  ?  ?  ?  ?General Gait Details: pt does not have prosthetics, does not ambulate at baseline ? ? ?Stairs ?  ?  ?  ?  ?  ? ? ?Wheelchair Mobility ?  ? ?Modified Rankin (Stroke Patients Only) ?  ? ? ?  ?Balance Overall balance assessment: Mild deficits observed, not formally tested ?  ?  ?  ?  ?  ?  ?  ?  ?  ?  ?  ?  ?  ?  ?  ?  ?  ?  ?  ? ?  ?Cognition Arousal/Alertness: Awake/alert ?Behavior During Therapy: Flat affect ?Overall Cognitive Status: Impaired/Different from baseline ?Area of Impairment: Orientation, Memory, Safety/judgement, Awareness ?  ?  ?  ?  ?  ?  ?  ?  ?Orientation Level: Disoriented to, Time, Situation ?Current Attention Level: Sustained ?Memory: Decreased recall of precautions, Decreased short-term memory ?  ?Safety/Judgement: Decreased awareness of safety, Decreased awareness of deficits ?Awareness: Intellectual ?Problem Solving: Slow processing, Requires verbal cues ?General Comments: pt reports that he was suppose to get fit for prosthetics two day sago and pt has been admitted for 6 days. pt without awareness to  admission date. pt unaware of date/ month /year. pt asked to read the calendar and later in session referrs back to calendar when again asked showing recall. pt expressed he is going to rehab for 2 weeks to get prosthetics. pt is scheduled to d/c to SNF but the recommendation is not for prosthetic training ?  ?  ? ?  ?Exercises General Exercises - Lower Extremity ?Long Arc Quad: AROM, Both, 10 reps,  Seated ?Hip Flexion/Marching: AROM, Both, 10 reps, Seated ? ?  ?General Comments General comments (skin integrity, edema, etc.): callous on bil BKA and reports occassionally moving on BIL LE when he needs to get somewhere or falls. ?  ?  ? ?Pertinent Vitals/Pain Pain Assessment ?Pain Assessment: No/denies pain  ? ? ?Home Living Family/patient expects to be discharged to:: Skilled nursing facility ?  ?  ?  ?  ?  ?  ?  ?  ?  ?   ?  ?Prior Function    ?  ?  ?   ? ?PT Goals (current goals can now be found in the care plan section) Acute Rehab PT Goals ?Patient Stated Goal: to find his wheelchair ?Progress towards PT goals: Progressing toward goals ? ?  ?Frequency ? ? ? Min 2X/week ? ? ? ?  ?PT Plan Current plan remains appropriate  ? ? ?Co-evaluation PT/OT/SLP Co-Evaluation/Treatment: Yes ?Reason for Co-Treatment: For patient/therapist safety ?PT goals addressed during session: Mobility/safety with mobility ?  ?  ? ?  ?AM-PAC PT "6 Clicks" Mobility   ?Outcome Measure ? Help needed turning from your back to your side while in a flat bed without using bedrails?: None ?Help needed moving from lying on your back to sitting on the side of a flat bed without using bedrails?: None ?Help needed moving to and from a bed to a chair (including a wheelchair)?: A Little ?Help needed standing up from a chair using your arms (e.g., wheelchair or bedside chair)?: Total ?Help needed to walk in hospital room?: Total ?Help needed climbing 3-5 steps with a railing? : Total ?6 Click Score: 14 ? ?  ?End of Session Equipment Utilized During Treatment: Gait belt ?Activity Tolerance: Patient tolerated treatment well ?Patient left: in chair;with call bell/phone within reach;with chair alarm set ?Nurse Communication: Mobility status ?PT Visit Diagnosis: Other abnormalities of gait and mobility (R26.89);History of falling (Z91.81) ?  ? ? ?Time: 1116-1140 ?PT Time Calculation (min) (ACUTE ONLY): 24 min ? ?Charges:  $Therapeutic Activity: 8-22  mins          ?          ? ?Cory Palmer M,PT ?Acute Rehab Services ?[phone] ?[phone] (pager)  ? ? ?Arienna Benegas F Brier Reid ?11/17/2021, 2:09 PM ? ?

## 2021-11-17 NOTE — Assessment & Plan Note (Signed)
-  Continue PPI, sucralfate ?

## 2021-11-17 NOTE — Assessment & Plan Note (Signed)
Bilateral amputee ?

## 2021-11-17 NOTE — Assessment & Plan Note (Signed)
-   Consult TOC. 

## 2021-11-17 NOTE — Assessment & Plan Note (Signed)
See above

## 2021-11-17 NOTE — Assessment & Plan Note (Signed)
Supplemented 

## 2021-11-17 NOTE — TOC Progression Note (Addendum)
Transition of Care (TOC) - Progression Note  ? ? ?Patient Details  ?Name: Cory Palmer ?MRN: [id number] ?Date of Birth: 09-06-1960 ? ?Transition of Care (TOC) CM/SW Contact  ?Milas Gain, LCSWA ?Phone Number: ?11/17/2021, 11:31 AM ? ?Clinical Narrative:    ? ?UpdateShirlee Limerick from [facility] confirmed [facility] cannot [facility] SN[facility] for patient. [facility] also confirmed they cannot offer SNF bed for patient. Patient currently has no SNF bed offers.CSW informed MD and case Freight forwarder.  ? ?CSW spoke with Cor[place]na from [facility]. Shirlee Limerick reports they are still determining if they can offer SNF bed for patient. She is going to follow up with her DON and give CSW a callback. Patients insurance authorization is good through 4/20.CSW will continue to follow and assist with patients dc planning needs. ? ?Expected Discharge Plan: [place] ?Barriers to Discharge: Unsafe home situation ? ?Expected Discharge Plan and Services ?Expected Discharge Plan: [place] ?  ?  ?  ?Living arrangements for the past 2 months: Hotel/Motel ?                ?  ?  ?  ?  ?  ?  ?  ?  ?  ?  ? ? ?Social Determinants of Health (SDOH) Interventions ?  ? ?Readmission Risk Interventions ? ?  10/21/2019  ?  1:04 PM  ?Readmission Risk Prevention Plan  ?Transportation Screening Complete  ?PCP or Specialist Appt within 5-7 Days Complete  ?Home Care Screening Complete  ?Medication Review (RN CM) Complete  ? ? ?

## 2021-11-17 NOTE — Progress Notes (Signed)
Patient oriented to self and place with disorientation to time and situation. Mostly drowsy and sleeping this shift but pleasant, calm, and following commands when awakened. Took oral meds with no issue. Soft restraint waist belt discontinued. Patient not attempting to get out of bed.  ?
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
## 2021-11-18 ENCOUNTER — Other Ambulatory Visit (HOSPITAL_COMMUNITY): Payer: Self-pay

## 2021-11-18 DIAGNOSIS — K7460 Unspecified cirrhosis of liver: Secondary | ICD-10-CM

## 2021-11-18 DIAGNOSIS — S88111A Complete traumatic amputation at level between knee and ankle, right lower leg, initial encounter: Secondary | ICD-10-CM

## 2021-11-18 DIAGNOSIS — S88112A Complete traumatic amputation at level between knee and ankle, left lower leg, initial encounter: Secondary | ICD-10-CM

## 2021-11-18 DIAGNOSIS — K7682 Hepatic encephalopathy: Secondary | ICD-10-CM

## 2021-11-18 LAB — BASIC METABOLIC PANEL
Anion gap: 10 (ref 5–15)
BUN: 16 mg/dL (ref 8–23)
CO2: 19 mmol/L — ABNORMAL LOW (ref 22–32)
Calcium: 8.5 mg/dL — ABNORMAL LOW (ref 8.9–10.3)
Chloride: 108 mmol/L (ref 98–111)
Creatinine, Ser: 0.85 mg/dL (ref 0.61–1.24)
GFR, Estimated: >60 mL/min (ref >60)
Glucose, Bld: 187 mg/dL — ABNORMAL HIGH (ref 70–99)
Potassium: 3.5 mmol/L (ref 3.5–5.1)
Sodium: 137 mmol/L (ref 135–145)

## 2021-11-18 LAB — CBC
HCT: 35.3 % — ABNORMAL LOW (ref 39.0–52.0)
Hemoglobin: 11.9 g/dL — ABNORMAL LOW (ref 13.0–17.0)
MCH: 31.4 pg (ref 26.0–34.0)
MCHC: 33.7 g/dL (ref 30.0–36.0)
MCV: 93.1 fL (ref 80.0–100.0)
Platelets: 102 10*3/uL — ABNORMAL LOW (ref 150–400)
RBC: 3.79 MIL/uL — ABNORMAL LOW (ref 4.22–5.81)
RDW: 15.5 % (ref 11.5–15.5)
WBC: 5.7 10*3/uL (ref 4.0–10.5)
nRBC: 0 % (ref 0.0–0.2)

## 2021-11-18 LAB — GLUCOSE, CAPILLARY
Glucose-Capillary: 168 mg/dL — ABNORMAL HIGH (ref 70–99)
Glucose-Capillary: 134 mg/dL — ABNORMAL HIGH (ref 70–99)
Glucose-Capillary: 182 mg/dL — ABNORMAL HIGH (ref 70–99)

## 2021-11-18 LAB — AMMONIA: Ammonia: 95 umol/L — ABNORMAL HIGH (ref 9–35)

## 2021-11-18 MED ORDER — CARVEDILOL 12.5 MG PO TABS
12.5000 mg | ORAL_TABLET | Freq: Two times a day (BID) | ORAL | 3 refills | Status: DC
  Filled 2021-11-18: qty 60, 30d supply, fill #0

## 2021-11-18 MED ORDER — FERROUS SULFATE 325 (65 FE) MG PO TABS
325.0000 mg | ORAL_TABLET | Freq: Every day | ORAL | 2 refills | Status: DC
  Filled 2021-11-18: qty 30, 30d supply, fill #0

## 2021-11-18 MED ORDER — LACTULOSE 10 GM/15ML PO SOLN
30.0000 g | Freq: Three times a day (TID) | ORAL | 3 refills | Status: DC
  Filled 2021-11-18: qty 236, 2d supply, fill #0

## 2021-11-18 NOTE — Progress Notes (Signed)
?  Progress Note ? ? ?Patient: Cory Palmer [id number] DOB: Jun 02, 1961 DOA: 11/11/2021     6 ?DOS: the patient was seen and examined on 11/18/2021 at 9:16AM ?  ? ? ? ?Brief hospital course: ?Cory Palmer is a 61 y.o. M with pAF not on AC, alcohol use disorder and alcoholic cirrhosis, Bipolar disorder, and esophagitis who presented with confusion. ? ?Evidently found in a hotel room, unable to provide history.   ? ? ? ? ?Assessment and Plan: ?* Acute metabolic encephalopathy ?Seems at baseline ?-Continue lactulose ? ? ?  ?  ? ?PAF (paroxysmal atrial fibrillation) (HCC) ?-Continue Coreg ?- not on AC ? ? ?Diabetes mellitus type II, non insulin dependent (HCC) ?-Hold metformin  ?-Continue SS corrections ?  ? ?Esophagitis ?-Continue PPI, sucralfate ?  ?  ? ?PVD (peripheral vascular disease) (HCC) ?-Continue Coreg ?  ? ?Depressed bipolar disorder (HCC) ?-Continue Seroquel, Zoloft and trazodone ?  ? ? ? ? ? ? ? ? ? ?Subjective: No complaints. ? ? ? ? ?Physical Exam: ?Vitals:  ? 11/17/21 1701 11/17/21 1900 11/18/21 0500 11/18/21 1252  ?BP: (!) 106/58 96/60  98/61  ?Pulse: 66   69  ?Resp: 16 18 16 17   ?Temp: (!) 97.4 ?F (36.3 ?C) 97.8 ?F (36.6 ?C) 98 ?F (36.7 ?C) 98.9 ?F (37.2 ?C)  ?TempSrc: Axillary Oral Axillary Oral  ?SpO2: 95%     ?Weight:   107.5 kg   ?Height:      ? ?Patient is elderly adult male, lying in bed, bilateral amputee. ?Interactive, appropriate ?Seems to have some mild psychomotor slowing, but oriented to person, place, time, seems about at baseline ?RRR, no murmurs, no peripheral edema ?Lung sounds clear without rales or wheezes ?Abdomen soft no tenderness palpation or guarding ?Moves upper extremities with normal strength and coordination, speech fluent, face symmetric ? ?Data Reviewed: ?Glucose reviewed, normal ?Complete blood count notable for hemoglobin 12, no change ?Basic metabolic panel normal ?Pneumonia 95 ? ?Family Communication:  ? ? ? ?Disposition: ?Status is: Inpatient ?Patient remains medically  stable for discharge after being admitted for hepatic encephalopathy.  At present he is a bilateral amputee with no wheelchair, living in a hotel.  He is working with social work to figure out a safe disposition ? ? ? ? ? ? ? ?Author: ? , MD ?11/18/2021 3:17 PM ? ?For on call review [email or web address].  ? ? ?

## 2021-11-18 NOTE — TOC Progression Note (Addendum)
Transition of Care (TOC) - Progression Note  ? ? ?Patient Details  ?Name: Cory Palmer ?MRN: [id number] ?Date of Birth: 1960-11-08 ? ?Transition of Care (TOC) CM/SW Contact  ?Milas Gain, LCSWA ?Phone Number: ?11/18/2021, 12:03 PM ? ?Clinical Narrative:    ? ? ?Update- CSW received callback from April Anderson with [facility] who informed CSW that patients wheelchair is at the hotel. CM working on hh services for patient. CSW called patients insurance and cancelled patients insurance auth for snf.Plan is for patient to transport by PTAR and to dc back to hotel with [facility] services.  ? ?CSW met with patient at bedside. Patient now reports his wheelchair should be at his hotel when he returns. Patient reports he is interested in hh services. Patient gave CSW permissiont speak with [facility] to confirm that is wheelchair is not there. CSW called [facility] and spoke with April Anderson. April is going to check and make sure patients wheelchair is not there. April confirmed patient does have a room at the hotel. CSW awaiting callback. ? ?Expected Discharge Plan: [place] ?Barriers to Discharge: Unsafe home situation ? ?Expected Discharge Plan and Services ?Expected Discharge Plan: [place] ?  ?  ?  ?Living arrangements for the past 2 months: Hotel/Motel ?                ?  ?  ?  ?  ?  ?  ?  ?  ?  ?  ? ? ?Social Determinants of Health (SDOH) Interventions ?  ? ?Readmission Risk Interventions ? ?  10/21/2019  ?  1:04 PM  ?Readmission Risk Prevention Plan  ?Transportation Screening Complete  ?PCP or Specialist Appt within 5-7 Days Complete  ?Home Care Screening Complete  ?Medication Review (RN CM) Complete  ? ? ?

## 2021-11-18 NOTE — Progress Notes (Signed)
Patient given PM meds. Order for discharge. PIV and nicotine patch removed. AVS reviewed with patient. PTAR arrived for patient transport back to hotel. Home Health to follow up with patient but instructed patient to reach out to them.  ?

## 2021-11-18 NOTE — TOC Transition Note (Signed)
Transition of Care (TOC) - CM/SW Discharge Note ? ? ?Patient Details  ?Name: Cory Palmer ?MRN: [id number] ?Date of Birth: 03/06/1961 ? ?Transition of Care (TOC) CM/SW Contact:  ?Delilah Shan, LCSWA ?Phone Number: ?11/18/2021, 3:59 PM ? ? ?Clinical Narrative:    ? ?Patient will DC to: [address] [place] [address]  ? ?Anticipated DC date: 11/18/2021 ? ?Family notified: Patient declined  ? ?Transport by: Sharin Mons ? ?? ? ?Per MD patient ready for DC to [address] [place] [address] with [facility] services . RN, and patient notified of DC. Discharge Summary sent to facility. Ambulance transport requested for patient. ? ?CSW signing off.  ? ?  ?Barriers to Discharge: Unsafe home situation ? ? ?Patient Goals and CMS Choice ?  ?CMS Medicare.gov Compare Post Acute Care list provided to:: Patient ?Choice offered to / list presented to : Patient ? ?Discharge Placement ?  ?           ?  ?  ?  ?  ? ?Discharge Plan and Services ?  ?  ?           ?  ?  ?  ?  ?  ?  ?  ?  ?  ?  ? ?Social Determinants of Health (SDOH) Interventions ?  ? ? ?Readmission Risk Interventions ? ?  10/21/2019  ?  1:04 PM  ?Readmission Risk Prevention Plan  ?Transportation Screening Complete  ?PCP or Specialist Appt within 5-7 Days Complete  ?Home Care Screening Complete  ?Medication Review (RN CM) Complete  ? ? ? ? ? ?

## 2021-11-18 NOTE — Progress Notes (Signed)
Physical Therapy Treatment and D/C ?Patient Details ?Name: Cory Palmer ?MRN: [id number] ?DOB: September 03, 1960 ?Today's Date: 11/18/2021 ? ? ?History of Present Illness The pt is a 61 yo male presenting 4/13 after being found down by [employer]. Upon work up, pt with AMS, concern for hepatic encephalopathy due to known liver cirrhosis. PMH includes: afib (not adherent to medications), hepatic cirrhosis, DM II, bipolar disorder with depression and anxiety, and bilateral BKA. ? ?  ?PT Comments  ? ? Pt admitted with above diagnosis. Pt is able to transfer to the recliner without assist by PT.  Plan is for pt to be sent to motel today with transport arranged but only if the facility gets his wheelchair to the motel.  Pt reports to this PT that his wheels fall off his wheelchair at times and that it is 62 years old. Discussed this with CM and she is going to determine if a new wheelchair is possible.  Also pt would benefit from a tub bench as well.  Pt has met goals for inpt PT as he can transfer indenpendently. Will sign off at this time.   ?Recommendations for follow up therapy are one component of a multi-disciplinary discharge planning process, led by the attending physician.  Recommendations may be updated based on patient status, additional functional criteria and insurance authorization. ? ?Follow Up Recommendations ? Home health PT ?  ?  ?Assistance Recommended at Discharge PRN  ?Patient can return home with the following Assistance with cooking/housework;Assist for transportation;Help with stairs or ramp for entrance ?  ?Equipment Recommendations ? Wheelchair (measurements PT);Wheelchair cushion (measurements PT);Other (comment) (tub bench)  ?  ?Recommendations for Other Services   ? ? ?  ?Precautions / Restrictions Precautions ?Precautions: Fall ?Precaution Comments: pt with bilateral BKA (chronic) no prosthetics ?Restrictions ?Weight Bearing Restrictions: No  ?  ? ?Mobility ? Bed Mobility ?Overal bed mobility:  Independent ?  ?  ?  ?  ?  ?  ?  ?  ? ?Transfers ?Overall transfer level: Modified independent ?Equipment used: None ?Transfers: Bed to chair/wheelchair/BSC ?  ?  ?  ?  ?  ? Lateral/Scoot Transfers: Supervision ?General transfer comment: Pt transferred  with chair placed for posterior transfer. pt able to complete transfer with Independence. safe technique noted. ?  ? ?Ambulation/Gait ?  ?  ?  ?  ?  ?  ?  ?General Gait Details: pt does not have prosthetics, does not ambulate at baseline ? ? ?Stairs ?  ?  ?  ?  ?  ? ? ?Wheelchair Mobility ?  ? ?Modified Rankin (Stroke Patients Only) ?  ? ? ?  ?Balance Overall balance assessment: Mild deficits observed, not formally tested ?  ?  ?  ?  ?  ?  ?  ?  ?  ?  ?  ?  ?  ?  ?  ?  ?  ?  ?  ? ?  ?Cognition Arousal/Alertness: Awake/alert ?Behavior During Therapy: Flat affect ?Overall Cognitive Status: Impaired/Different from baseline ?Area of Impairment: Orientation, Memory, Safety/judgement, Awareness ?  ?  ?  ?  ?  ?  ?  ?  ?Orientation Level: Disoriented to, Time, Situation ?Current Attention Level: Sustained ?Memory: Decreased recall of precautions, Decreased short-term memory ?  ?Safety/Judgement: Decreased awareness of safety, Decreased awareness of deficits ?Awareness: Intellectual ?Problem Solving: Slow processing, Requires verbal cues ?  ?  ?  ? ?  ?Exercises General Exercises - Lower Extremity ?Long Arc Quad: AROM, Both, 10  reps, Seated ?Hip Flexion/Marching: AROM, Both, 10 reps, Seated ? ?  ?General Comments   ?  ?  ? ?Pertinent Vitals/Pain Pain Assessment ?Pain Assessment: No/denies pain  ? ? ?Home Living   ?  ?  ?  ?  ?  ?  ?  ?  ?  ?   ?  ?Prior Function    ?  ?  ?   ? ?PT Goals (current goals can now be found in the care plan section) Progress towards PT goals: Progressing toward goals ? ?  ?Frequency ? ? ?   ? ? ? ?  ?PT Plan Discharge plan needs to be updated  ? ? ?Co-evaluation   ?  ?  ?  ?  ? ?  ?AM-PAC PT "6 Clicks" Mobility   ?Outcome Measure ? Help needed  turning from your back to your side while in a flat bed without using bedrails?: None ?Help needed moving from lying on your back to sitting on the side of a flat bed without using bedrails?: None ?Help needed moving to and from a bed to a chair (including a wheelchair)?: None ?Help needed standing up from a chair using your arms (e.g., wheelchair or bedside chair)?: Total ?Help needed to walk in hospital room?: Total ?Help needed climbing 3-5 steps with a railing? : Total ?6 Click Score: 15 ? ?  ?End of Session Equipment Utilized During Treatment: Gait belt ?Activity Tolerance: Patient tolerated treatment well ?Patient left: in chair;with call bell/phone within reach;with chair alarm set ?Nurse Communication: Mobility status ?PT Visit Diagnosis: Other abnormalities of gait and mobility (R26.89);History of falling (Z91.81) ?  ? ? ?Time: 4970-2637 ?PT Time Calculation (min) (ACUTE ONLY): 15 min ? ?Charges:  $Therapeutic Activity: 8-22 mins          ?          ? ?Sina Lucchesi M,PT ?Acute Rehab Services ?[phone] ?[phone] (pager)  ? ? ?Lancer Thurner F Arienne Gartin ?11/18/2021, 12:20 PM ? ?

## 2021-11-18 NOTE — Care Management (Cosign Needed)
?  ?  Durable Medical Equipment  ?(From admission, onward)  ?  ? ? ?  ? ?  Start     Ordered  ? 11/18/21 1608  For home use only DME lightweight manual wheelchair with seat cushion  Once       ?Comments: Patient suffers from bilateral amputation of the legs which impairs their ability to perform daily activities like bathing, dressing, feeding, grooming, and toileting in the home.  A cane, crutch, or walker will not resolve  ?issue with performing activities of daily living. A wheelchair will allow patient to safely perform daily activities. Patient is not able to propel themselves in the home using a standard weight wheelchair due to endurance and general weakness. Patient can self propel in the lightweight wheelchair. Length of need Lifetime. ?Accessories: elevating leg rests (ELRs), wheel locks, extensions and anti-tippers.  ? 11/18/21 1607  ? ?  ?  ? ?  ?  ?

## 2021-11-18 NOTE — Plan of Care (Signed)
progressing 

## 2021-11-18 NOTE — TOC Transition Note (Signed)
Transition of Care (TOC) - CM/SW Discharge Note ? ? ?Patient Details  ?Name: Cory Palmer ?MRN: [id number] ?Date of Birth: 08-Jul-1961 ? ?Transition of Care (TOC) CM/SW Contact:  ?Graves-Bigelow, Ocie Cornfield, RN ?Phone Number: ?11/18/2021, 4:19 PM ? ? ?Clinical Narrative: Case Manager spoke with the patient regarding home health services. Patient is agreeable to home health at the [facility]) [name], [place] [address]. Case Manager called several home health agencies and [facility] is willing to assist with home health: RN,PT,OT, CSW. Office to contact the patients Case Manager from [facility] at [phone] for visit times. Patient states his phone is not working at this time. [place] to assist to see if they can explore prosthesis for the patient. Case Manager asked MD to write order for new wheelchair-[employer] has the referral and will deliver to the patients motel room. Case Manager spoke with April Case Manager and the patient has food stamps and a u-card for groceries. Meals are delivered via [employer]. CSW assisting the patient with transportation home via [place]. No further needs identified at this time.  ? ?Final next level of care: [place] ?Barriers to Discharge: No Barriers Identified ? ? ?Patient Goals and CMS Choice ?Patient states their goals for this hospitalization and ongoing recovery are:: to return home ?CMS Medicare.gov Compare Post Acute Care list provided to:: Patient ?Choice offered to / list presented to :  (Patient did not have an agency preference.) ? ?Discharge Plan and Services ?In-house Referral: Clinical Social Work ?Discharge Planning Services: CM Consult ?           ?DME Arranged: High strength lightweight manual wheelchair with seat cushion ?DME Agency: [employer] ?Date DME Agency Contacted: 11/18/21 ?Time DME Agency Contacted: [phone] ?Representative spoke with at DME Agency:  Annie Sable ?HH Arranged: RN, Disease Management, PT, OT, Social Work ?[place] Agency: [facility] ?Date HH Agency Contacted: 11/18/21 ?Time [place]: 1300 ?Representative spoke with at [place]: Levada Dy ? ?Readmission Risk Interventions ? ?  11/18/2021  ?  4:15 PM 10/21/2019  ?  1:04 PM  ?Readmission Risk Prevention Plan  ?Transportation Screening Complete Complete  ?PCP or Specialist Appt within 5-7 Days  Complete  ?Home Care Screening  Complete  ?Medication Review (RN CM)  Complete  ?Medication Review Press photographer) Complete   ?PCP or Specialist appointment within 3-5 days of discharge Complete   ?[place] or Home Care Consult Complete   ?SW Recovery Care/Counseling Consult Complete   ?Palliative Care Screening Not Applicable   ? ? ? ? ? ?

## 2021-11-18 NOTE — Progress Notes (Signed)
Physical Therapy Discharge ?Patient Details ?Name: Cory Palmer ?MRN: [id number] ?DOB: 1961/04/30 ?Today's Date: 11/18/2021 ?Time: 8485-9276 ?PT Time Calculation (min) (ACUTE ONLY): 15 min ? ?Patient discharged from PT services secondary to goals met and no further PT needs identified. ? ?Please see latest therapy progress note for current level of functioning and progress toward goals.   ? ?Progress and discharge plan discussed with patient and/or caregiver: Patient/Caregiver agrees with plan ? ?GP ?   ? ?Donyelle Enyeart F Acsa Estey ?11/18/2021, 12:25 PM  ?Arrie Aran M,PT ?Acute Rehab Services ?[phone] ?[phone] (pager)  ?

## 2021-11-18 NOTE — Discharge Summary (Signed)
?Physician Discharge Summary ?  ?Patient: Cory Palmer MRN: [id number] DOB: 07-22-1961  ?Admit date:     11/11/2021  ?Discharge date: 11/18/21  ?Discharge Physician: Alberteen Sam  ? ?PCP: Dois Davenport, MD  ? ? ? ?Recommendations at discharge:  ?Follow up with PCP Dr. Hal Hope in 1 week ?Dr. Hal Hope: Please verify lactulose use, and refer to GI for cirrhosis evaluation ? ? ? ? ?Discharge Diagnoses: ?Principal Problem: ?  Hepatic encephalopathy ?Active Problems: ?  Acute metabolic encephalopathy due to hepatic encephalopathy ?  Cirrhosis of liver (HCC) ?  PAF (paroxysmal atrial fibrillation) (HCC) ?  Diabetes mellitus type II, non insulin dependent (HCC) ?  Thrombocytopenia (HCC) ?  Homelessness ?  Depressed bipolar disorder (HCC) ?  Hypokalemia ?  Bilateral Below-knee amputation   ?  Severe protein-calorie malnutrition (HCC) ?  PVD (peripheral vascular disease) (HCC) ?  Metabolic acidosis ?  ? ? ?Hospital Course: ?Mr. Schroader is a 61 y.o. M with pAF not on AC, alcohol use disorder and alcoholic cirrhosis, Bipolar disorder, and esophagitis who presented with confusion. ? ?Evidently found in a hotel room, unable to provide history.   ? ? ?Hepatic encephalopathy ?The patient was admitted and started on lactulose. ? ?His mentation improved.  He was evaluated by psychiatry, who felt that his presentation was not consistent with Bipolar disorder, he appeared to be at mental baseline and was stable on his outpatient psychiatric regimen. ? ?Cirrhosis ?Actively drinking and with new symptoms of synthetic liver dysfunction (thrombocytopenia and hepatic encephalopathy and mild elevation in INR). ?- Recommend PCP follow up and GI referral ? ?Homelessness ?His disposition was complicated by the fact that he lives in a hotel and has inadequate social support and mobility and his wheelchair was missing. ? ?Social work worked with his outside Child psychotherapist to determine the location of his wheelchair and secure home health  services.  Attempts were made to facilitate SNF rehab, but all efforts failed despite extensive search. ? ?Social work were able to work with his outside Child psychotherapist and arranged a safe disposition. ? ? ? ? ?  ? ? ?  ? ? ? ? ? ?[facility] Controlled Substances Registry was reviewed for this patient prior to discharge.  ? ?Consultants: Psychiatry ?Diet recommendation:  ?Discharge Diet Orders (From admission, onward)  ? ?  Start     Ordered  ? 11/18/21 0000  Diet - low sodium heart healthy       ? 11/18/21 1549  ? ?  ?  ? ?  ? ? ? ?DISCHARGE MEDICATION: ?Allergies as of 11/18/2021   ?No Known Allergies ?  ? ?  ?Medication List  ?  ? ?STOP taking these medications   ? ?Advil PM 200-25 MG Caps ?Generic drug: Ibuprofen-diphenhydrAMINE HCl ?  ?ondansetron 4 MG disintegrating tablet ?Commonly known as: ZOFRAN-ODT ?  ?polyethylene glycol 17 g packet ?Commonly known as: MIRALAX / GLYCOLAX ?  ?promethazine 25 MG tablet ?Commonly known as: PHENERGAN ?  ?sucralfate 1 g tablet ?Commonly known as: CARAFATE ?  ?Tums Ultra 1000 400 MG chewable tablet ?Generic drug: calcium elemental as carbonate ?  ?ZzzQuil 25 MG Caps ?Generic drug: diphenhydrAMINE HCl (Sleep) ?  ? ?  ? ?TAKE these medications   ? ?carvedilol 12.5 MG tablet ?Commonly known as: COREG ?Take 1 tablet (12.5 mg total) by mouth 2 (two) times daily with a meal. ?  ?docusate sodium 100 MG capsule ?Commonly known as: COLACE ?Take 100 mg by mouth  2 (two) times daily as needed for mild constipation. ?  ?ferrous sulfate 325 (65 FE) MG tablet ?Take 1 tablet (325 mg total) by mouth daily with breakfast. ?  ?lactulose 10 GM/15ML solution ?Commonly known as: CHRONULAC ?Take 45 mLs (30 g total) by mouth 3 (three) times daily. ?  ?metFORMIN 1000 MG tablet ?Commonly known as: GLUCOPHAGE ?Take 1 tablet (1,000 mg total) by mouth 2 (two) times daily with a meal. ?What changed: when to take this ?  ?multivitamin with minerals Tabs tablet ?Take 1 tablet by mouth daily. ?   ?omeprazole 20 MG capsule ?Commonly known as: PRILOSEC ?Take 20 mg by mouth daily as needed (for GERD-like symptoms). ?  ?QUEtiapine 300 MG tablet ?Commonly known as: SEROQUEL ?Take 1 tablet (300 mg total) by mouth at bedtime. ?What changed: how much to take ?  ?sertraline 50 MG tablet ?Commonly known as: ZOLOFT ?Take 1 tablet (50 mg total) by mouth daily. ?  ?traZODone 150 MG tablet ?Commonly known as: DESYREL ?Take 1 tablet (150 mg total) by mouth at bedtime. ?What changed: how much to take ?  ? ?  ? ? ? ?Discharge Instructions   ? ? Diet - low sodium heart healthy   Complete by: As directed ?  ? Discharge instructions   Complete by: As directed ?  ? START taking lactulose ?This is for clearing out the toxins that build up due to liver disease ?Make sure you take lactulose 3 times per day so that you have 2 or 3 soft BMs per day. ?If you do not have 3 BMs per day, increase the lactulose more frequently ?If you are having MORE than 3 BMs per day, reduce the dose ? ?Go see your primary care doctor in 1 week  ? Increase activity slowly   Complete by: As directed ?  ? ?  ? ? ?Discharge Exam: ?Filed Weights  ? 11/15/21 0404 11/16/21 0334 11/18/21 0500  ?Weight: 111 kg 108.1 kg 107.5 kg  ? ? ?General: Pt is alert, awake, not in acute distress, bilateral amputee ?Cardiovascular: RRR, nl S1-S2, no murmurs appreciated.   No LE edema.   ?Respiratory: Normal respiratory rate and rhythm.  CTAB without rales or wheezes. ?Abdominal: Abdomen soft and non-tender.  No distension or HSM.   ?Neuro/Psych: Strength symmetric in upper and lower extremities.  Judgment and insight appear mildly impaired but at baseline and oriented to person, place time and situation. ? ? ?Condition at discharge: stable ? ?The results of significant diagnostics from this hospitalization (including imaging, microbiology, ancillary and laboratory) are listed below for reference.  ? ?Imaging Studies: ?CT Head Wo Contrast ? ?Result Date: 11/11/2021 ?CLINICAL  DATA:  Altered mental status. EXAM: CT HEAD WITHOUT CONTRAST TECHNIQUE: Contiguous axial images were obtained from the base of the skull through the vertex without intravenous contrast. RADIATION DOSE REDUCTION: This exam was performed according to the departmental dose-optimization program which includes automated exposure control, adjustment of the mA and/or kV according to patient size and/or use of iterative reconstruction technique. COMPARISON:  May 02, 2020 FINDINGS: Brain: There is mild cerebral atrophy with widening of the extra-axial spaces and ventricular dilatation. There are areas of decreased attenuation within the white matter tracts of the supratentorial brain, consistent with microvascular disease changes. Vascular: No hyperdense vessel or unexpected calcification. Skull: Normal. Negative for fracture or focal lesion. Sinuses/Orbits: No acute finding. Other: None. IMPRESSION: 1. No acute intracranial abnormality. 2. Generalized cerebral atrophy. Electronically Signed   By: Aram Candela  M.D.   On: 11/11/2021 21:17  ? ?MR Brain Wo Contrast (neuro protocol) ? ?Result Date: 11/12/2021 ?CLINICAL DATA:  61 year old male with altered mental status. Paroxysmal atrial fibrillation. EXAM: MRI HEAD WITHOUT CONTRAST TECHNIQUE: Multiplanar, multiecho pulse sequences of the brain and surrounding structures were obtained without intravenous contrast. COMPARISON:  Head CT 11/11/2021. FINDINGS: Brain: No restricted diffusion to suggest acute infarction. No midline shift, mass effect, evidence of mass lesion, ventriculomegaly, extra-axial collection or acute intracranial hemorrhage. Cervicomedullary junction and pituitary are within normal limits. Suggestion of some generalized cerebral volume loss for age. And suggestion of intrinsic increased T1 hyperintensity in the bilateral globus pallidus (series 12, image 26). Background gray and white matter signal otherwise is within normal limits for age. No cortical  encephalomalacia or chronic cerebral blood products identified. Vascular: Major intracranial vascular flow voids are preserved. Skull and upper cervical spine: Negative. Visualized bone marrow signal is withi

## 2021-11-18 NOTE — Care Management Important Message (Signed)
Important Message ? ?Patient Details  ?Name: Cory Palmer ?MRN: [id number] ?Date of Birth: [birth year] ? ? ?Medicare Important Message Given:  Yes ? ? ? ? ?Shelda Altes ?11/18/2021, 9:26 AM ?

## 2021-11-22 ENCOUNTER — Other Ambulatory Visit (HOSPITAL_COMMUNITY): Payer: Self-pay

## 2021-11-23 ENCOUNTER — Other Ambulatory Visit (HOSPITAL_COMMUNITY): Payer: Self-pay

## 2021-12-01 ENCOUNTER — Encounter (HOSPITAL_BASED_OUTPATIENT_CLINIC_OR_DEPARTMENT_OTHER): Payer: Self-pay

## 2021-12-01 ENCOUNTER — Other Ambulatory Visit: Payer: Self-pay

## 2021-12-01 ENCOUNTER — Emergency Department (HOSPITAL_BASED_OUTPATIENT_CLINIC_OR_DEPARTMENT_OTHER)
Admission: EM | Admit: 2021-12-01 | Discharge: 2021-12-01 | Disposition: A | Discharge status: Home / Self Care | Payer: Medicare Other | Attending: Emergency Medicine | Admitting: Emergency Medicine

## 2021-12-01 DIAGNOSIS — S8992XA Unspecified injury of left lower leg, initial encounter: Secondary | ICD-10-CM | POA: Diagnosis present

## 2021-12-01 DIAGNOSIS — S80812A Abrasion, left lower leg, initial encounter: Secondary | ICD-10-CM | POA: Diagnosis not present

## 2021-12-01 DIAGNOSIS — R296 Repeated falls: Secondary | ICD-10-CM | POA: Diagnosis not present

## 2021-12-01 DIAGNOSIS — W050XXA Fall from non-moving wheelchair, initial encounter: Secondary | ICD-10-CM | POA: Insufficient documentation

## 2021-12-01 MED ORDER — SERTRALINE HCL 25 MG PO TABS
50.0000 mg | ORAL_TABLET | Freq: Every day | ORAL | Status: DC
  Administered 2021-12-01: 50 mg via ORAL
  Filled 2021-12-01: qty 2

## 2021-12-01 MED ORDER — METFORMIN HCL 500 MG PO TABS: 1000.0000 mg | ORAL_TABLET | Freq: Two times a day (BID) | ORAL | Status: DC

## 2021-12-01 MED ORDER — LACTULOSE 10 GM/15ML PO SOLN: 30.0000 g | Freq: Three times a day (TID) | ORAL | Status: DC

## 2021-12-01 MED ORDER — TRAZODONE HCL 50 MG PO TABS: 150.0000 mg | ORAL_TABLET | Freq: Every day | ORAL | Status: DC

## 2021-12-01 MED ORDER — QUETIAPINE FUMARATE 100 MG PO TABS: 300.0000 mg | ORAL_TABLET | Freq: Every day | ORAL | Status: DC

## 2021-12-01 MED ORDER — CARVEDILOL 12.5 MG PO TABS: 12.5000 mg | ORAL_TABLET | Freq: Two times a day (BID) | ORAL | Status: DC

## 2021-12-01 NOTE — Discharge Planning (Signed)
RNCM confirmed with [employer] that pt has order for wheelchair to be delivered to his hotel room.  Leavy Cella states they were awaiting approval as pt received one in 2021 but with the current one needing repair, they can possibly send a replacement DME. ?

## 2021-12-01 NOTE — ED Provider Notes (Signed)
?[facility] EMERGENCY DEPT ?Provider Note ? ? ?CSN: [id number] ?Arrival date & time: 12/01/21  0950 ? ?  ? ?History ? ?Chief Complaint  ?Patient presents with  ? Fall  ? ? ?Cory Palmer is a 61 y.o. male.  Patient brought in by EMS after a fall.  Patient is a bilateral amputee wheelchair-bound and lives at a hotel.  He said his wheelchair has been broken and he keeps falling because of that.  He denies any loss of consciousness.  He is complaining of chronic stump pain and a new abrasion on his left stump.  He says he has access to an old wheelchair from [place] but is unable to get there.  ? ?The history is provided by the patient.  ?Fall ?This is a recurrent problem. The current episode started less than 1 hour ago. The problem occurs daily. The problem has not changed since onset.Pertinent negatives include no chest pain, no abdominal pain, no headaches and no shortness of breath. Nothing aggravates the symptoms. Nothing relieves the symptoms. He has tried nothing for the symptoms. The treatment provided no relief.  ? ?  ? ?Home Medications ?Prior to Admission medications   ?Medication Sig Start Date End Date Taking? Authorizing Provider  ?carvedilol (COREG) 12.5 MG tablet Take 1 tablet by mouth 2 times daily with a meal. 11/18/21   Danford, Earl Lites, MD  ?docusate sodium (COLACE) 100 MG capsule Take 100 mg by mouth 2 (two) times daily as needed for mild constipation.    [provider]  ?ferrous sulfate 325 (65 FE) MG tablet Take 1 tablet (325 mg total) by mouth daily with breakfast. 11/18/21   Danford, Earl Lites, MD  ?lactulose (CHRONULAC) 10 GM/15ML solution Take 45 mLs by mouth 3 times daily. 11/18/21   Danford, Earl Lites, MD  ?metFORMIN (GLUCOPHAGE) 1000 MG tablet Take 1 tablet (1,000 mg total) by mouth 2 (two) times daily with a meal. ?Patient taking differently: Take 1,000 mg by mouth daily with breakfast. 06/21/21 11/12/21  Cheryll Cockayne, MD  ?Multiple Vitamin  (MULTIVITAMIN WITH MINERALS) TABS tablet Take 1 tablet by mouth daily. 05/11/18   Danford, Earl Lites, MD  ?omeprazole (PRILOSEC) 20 MG capsule Take 20 mg by mouth daily as needed (for GERD-like symptoms).    [provider]  ?QUEtiapine (SEROQUEL) 300 MG tablet Take 1 tablet (300 mg total) by mouth at bedtime. ?Patient taking differently: Take 600 mg by mouth at bedtime. 04/12/21   Palumbo, April, MD  ?sertraline (ZOLOFT) 50 MG tablet Take 1 tablet (50 mg total) by mouth daily. 06/21/21   Cheryll Cockayne, MD  ?traZODone (DESYREL) 150 MG tablet Take 1 tablet (150 mg total) by mouth at bedtime. ?Patient taking differently: Take 300 mg by mouth at bedtime. 06/21/21   Cheryll Cockayne, MD  ?   ? ?Allergies    ?Patient has no known allergies.   ? ?Review of Systems   ?Review of Systems  ?Respiratory:  Negative for shortness of breath.   ?Cardiovascular:  Negative for chest pain.  ?Gastrointestinal:  Negative for abdominal pain.  ?Neurological:  Negative for headaches.  ? ?Physical Exam ?Updated Vital Signs ?BP (!) 144/93 (BP Location: Right Arm)   Pulse 96   Temp 97.8 ?F (36.6 ?C) (Oral)   Resp 16   Wt 116.4 kg   SpO2 100%   BMI 31.25 kg/m?  ?Physical Exam ?Vitals and nursing note reviewed.  ?Constitutional:   ?   General: He is not in  acute distress. ?   Appearance: Normal appearance. He is well-developed.  ?HENT:  ?   Head: Normocephalic and atraumatic.  ?Eyes:  ?   Conjunctiva/sclera: Conjunctivae normal.  ?Cardiovascular:  ?   Rate and Rhythm: Normal rate and regular rhythm.  ?   Heart sounds: No murmur heard. ?Pulmonary:  ?   Effort: Pulmonary effort is normal. No respiratory distress.  ?   Breath sounds: Normal breath sounds.  ?Abdominal:  ?   Palpations: Abdomen is soft.  ?   Tenderness: There is no abdominal tenderness. There is no guarding or rebound.  ?Musculoskeletal:     ?   General: No swelling.  ?   Cervical back: Neck supple.  ?   Comments: He has bilateral BKA's.  There is an abrasion at  the distal part of his left BKA.  ?Skin: ?   General: Skin is warm and dry.  ?   Capillary Refill: Capillary refill takes less than 2 seconds.  ?Neurological:  ?   General: No focal deficit present.  ?   Mental Status: He is alert.  ? ? ?ED Results / Procedures / Treatments   ?Labs ?(all labs ordered are listed, but only abnormal results are displayed) ?Labs Reviewed - No data to display ? ?EKG ?None ? ?Radiology ?No results found. ? ?Procedures ?Procedures  ? ? ?Medications Ordered in ED ?Medications - No data to display ? ?ED Course/ Medical Decision Making/ A&P ?Clinical Course as of 12/02/21 0847  ?Wed Dec 01, 2021  ?1303 Patient states he has not had any wheelchair in greater than 10 years.  Case management is working to see if we can get him set up with a wheelchair.  Patient needs a wheelchair because he is a bilateral amputee and cannot otherwise safely ambulate. [MB]  ?1321 Case management found out that the patient had received a new wheelchair in 2021.  She is not sure if they are going to approve another wheelchair.  Will discharge patient back to his hotel via [facility] [MB]  ?  ?Clinical Course User Index ?[MB] Terrilee Files, MD  ? ?                        ?Medical Decision Making ? ? ? ? ? ? ? ? ? ?Final Clinical Impression(s) / ED Diagnoses ?Final diagnoses:  ?Frequent falls  ?Abrasion of left lower extremity, initial encounter  ? ? ?Rx / DC Orders ?ED Discharge Orders   ? ? None  ? ?  ? ? ?  ?Terrilee Files, MD ?12/02/21 [phone] ? ?

## 2021-12-01 NOTE — ED Triage Notes (Signed)
Pt BIB [facility] from a hotel. Pt repots his wheel chair is broken and he has slid and fell out of it multiple times for the last several days. Pt c/o a "scrape" to his Left stump area, bleeding controlled on EMS arrival. Pt is a bilateral BKA.  ? ?VSS pt no distress, a/o x4 ?

## 2021-12-01 NOTE — ED Notes (Signed)
SW consult noted- SW picks up their referrals but I have also called them at [phone] and left a vm  AOM ?

## 2021-12-01 NOTE — ED Notes (Signed)
[facility] called- states 23 ahead AOM 13:28 ?

## 2021-12-01 NOTE — ED Triage Notes (Signed)
C/o BLE pain. Pt also rambling about pain medication, no way to get his meds, pain meds not working. When asked was transport his concern. Pt states, "no I get my pain meds, I have to transportation to my dr"  ? ?Pt has new scrape to Bilateral BKA. Bleeding controlled. Pt reports his wheel chair is broken, [facility] has offered him a new wheel chair but he's unable to get it d/t pain.   ?

## 2021-12-01 NOTE — ED Notes (Signed)
Wounds to stumps bilaterally cleansed and dressed.  Small  1 cm round wound to right top end of stump.  Left stump has dime size wound to top end of stump; small 1 cm wound to end of stump and several abrasions.  Non stick dressing applied to left stump; simple band aid applied to right stump.  ?

## 2021-12-01 NOTE — Discharge Planning (Signed)
Pt currently active with [facility] for [facility] services as confirmed by [facility] with Bjorn Loser of [facility].  Pt will resume HH services of RN/PT/OT/SW.   ?

## 2021-12-01 NOTE — Discharge Instructions (Signed)
You were seen in the emergency department for frequent falls due to your broken wheelchair.  Your abrasion was cleaned.  Case management is trying to get to a new wheelchair. ?

## 2022-01-20 ENCOUNTER — Other Ambulatory Visit: Payer: Self-pay

## 2022-01-20 ENCOUNTER — Emergency Department (HOSPITAL_COMMUNITY): Payer: Medicare Other

## 2022-01-20 ENCOUNTER — Emergency Department (HOSPITAL_COMMUNITY)
Admission: EM | Admit: 2022-01-20 | Discharge: 2022-01-22 | Disposition: A | Discharge status: Home / Self Care | Payer: Medicare Other | Attending: Emergency Medicine | Admitting: Emergency Medicine

## 2022-01-20 ENCOUNTER — Encounter (HOSPITAL_COMMUNITY): Payer: Self-pay | Admitting: Emergency Medicine

## 2022-01-20 DIAGNOSIS — R0602 Shortness of breath: Secondary | ICD-10-CM | POA: Insufficient documentation

## 2022-01-20 DIAGNOSIS — R110 Nausea: Secondary | ICD-10-CM | POA: Insufficient documentation

## 2022-01-20 DIAGNOSIS — E119 Type 2 diabetes mellitus without complications: Secondary | ICD-10-CM | POA: Diagnosis not present

## 2022-01-20 DIAGNOSIS — Z7984 Long term (current) use of oral hypoglycemic drugs: Secondary | ICD-10-CM | POA: Diagnosis not present

## 2022-01-20 DIAGNOSIS — Z79899 Other long term (current) drug therapy: Secondary | ICD-10-CM | POA: Insufficient documentation

## 2022-01-20 DIAGNOSIS — R079 Chest pain, unspecified: Secondary | ICD-10-CM | POA: Insufficient documentation

## 2022-01-20 DIAGNOSIS — I10 Essential (primary) hypertension: Secondary | ICD-10-CM | POA: Insufficient documentation

## 2022-01-20 LAB — TROPONIN I (HIGH SENSITIVITY)
Troponin I (High Sensitivity): 3 ng/L (ref <18)
Troponin I (High Sensitivity): 3 ng/L (ref <18)

## 2022-01-20 LAB — COMPREHENSIVE METABOLIC PANEL
ALT: 28 U/L (ref 0–44)
AST: 41 U/L (ref 15–41)
Albumin: 3.5 g/dL (ref 3.5–5.0)
Alkaline Phosphatase: 99 U/L (ref 38–126)
Anion gap: 9 (ref 5–15)
BUN: 7 mg/dL — ABNORMAL LOW (ref 8–23)
CO2: 22 mmol/L (ref 22–32)
Calcium: 9.3 mg/dL (ref 8.9–10.3)
Chloride: 106 mmol/L (ref 98–111)
Creatinine, Ser: 0.85 mg/dL (ref 0.61–1.24)
GFR, Estimated: >60 mL/min (ref >60)
Glucose, Bld: 182 mg/dL — ABNORMAL HIGH (ref 70–99)
Potassium: 4.1 mmol/L (ref 3.5–5.1)
Sodium: 137 mmol/L (ref 135–145)
Total Bilirubin: 1.4 mg/dL — ABNORMAL HIGH (ref 0.3–1.2)
Total Protein: 6.3 g/dL — ABNORMAL LOW (ref 6.5–8.1)

## 2022-01-20 LAB — CBC
HCT: 39.8 % (ref 39.0–52.0)
Hemoglobin: 13.5 g/dL (ref 13.0–17.0)
MCH: 33.3 pg (ref 26.0–34.0)
MCHC: 33.9 g/dL (ref 30.0–36.0)
MCV: 98.3 fL (ref 80.0–100.0)
Platelets: 75 10*3/uL — ABNORMAL LOW (ref 150–400)
RBC: 4.05 MIL/uL — ABNORMAL LOW (ref 4.22–5.81)
RDW: 15.1 % (ref 11.5–15.5)
WBC: 7.6 10*3/uL (ref 4.0–10.5)
nRBC: 0 % (ref 0.0–0.2)

## 2022-01-20 LAB — BRAIN NATRIURETIC PEPTIDE: B Natriuretic Peptide: 58.4 pg/mL (ref 0.0–100.0)

## 2022-01-20 LAB — MAGNESIUM: Magnesium: 1.8 mg/dL (ref 1.7–2.4)

## 2022-01-20 MED ORDER — LACTATED RINGERS IV BOLUS
1000.0000 mL | Freq: Once | INTRAVENOUS | Status: AC
  Administered 2022-01-20: 1000 mL via INTRAVENOUS

## 2022-01-20 MED ORDER — TRAZODONE HCL 150 MG PO TABS: 150.0000 mg | ORAL_TABLET | Freq: Every day | ORAL | 0 refills | Status: DC

## 2022-01-20 NOTE — ED Triage Notes (Signed)
Pt BIB EMS from home with NV that started this morning 3hrs ago started having chest pain and neck pain with ShOB. 324ASA given by EMS. Pt found in a-fib at 110 without hx. Pt out of all meds at home.

## 2022-01-20 NOTE — ED Provider Notes (Incomplete)
[facility] EMERGENCY DEPARTMENT Provider Note   CSN: [id number] Arrival date & time: 01/20/22  1728     History  Chief Complaint  Patient presents with   Chest Pain    Cory Palmer is a 61 y.o. male.  61 year old paroxysmal atrial fibrillation (not on anticoagulation due to chronic thrombocytopenia and nonadherence), hepatic cirrhosis with portal hypertension, type 2 diabetes, chronic thrombocytopenia, s/p bilateral BKA, bipolar disorder, housing insecurity presents to the ED with worsening central chest pain with associated nausea and nonbloody vomiting.  Patient states that he has had intermittent central chest pain that radiates into his right neck and head "for several years".  Today he states that he became tired of the pain and wanted to "get checked out".  He states that he did have x6 episodes of nonbloody vomiting which is new.  He reports running out of all of his home medications and states that he has not been sleeping well secondary to this. Denies fevers or abdominal pain.  The history is provided by the patient and medical records.       Home Medications Prior to Admission medications   Medication Sig Start Date End Date Taking? Authorizing Provider  carvedilol (COREG) 12.5 MG tablet Take 1 tablet by mouth 2 times daily with a meal. 11/18/21   Danford, Earl Lites, MD  docusate sodium (COLACE) 100 MG capsule Take 100 mg by mouth 2 (two) times daily as needed for mild constipation.    [provider]  ferrous sulfate 325 (65 FE) MG tablet Take 1 tablet (325 mg total) by mouth daily with breakfast. 11/18/21   Danford, Earl Lites, MD  lactulose (CHRONULAC) 10 GM/15ML solution Take 45 mLs by mouth 3 times daily. 11/18/21   Danford, Earl Lites, MD  metFORMIN (GLUCOPHAGE) 1000 MG tablet Take 1 tablet (1,000 mg total) by mouth 2 (two) times daily with a meal. Patient taking differently: Take 1,000 mg by mouth daily with breakfast. 06/21/21  11/12/21  Cheryll Cockayne, MD  Multiple Vitamin (MULTIVITAMIN WITH MINERALS) TABS tablet Take 1 tablet by mouth daily. 05/11/18   Danford, Earl Lites, MD  omeprazole (PRILOSEC) 20 MG capsule Take 20 mg by mouth daily as needed (for GERD-like symptoms).    [provider]  QUEtiapine (SEROQUEL) 300 MG tablet Take 1 tablet (300 mg total) by mouth at bedtime. Patient taking differently: Take 600 mg by mouth at bedtime. 04/12/21   Palumbo, April, MD  sertraline (ZOLOFT) 50 MG tablet Take 1 tablet (50 mg total) by mouth daily. 06/21/21   Cheryll Cockayne, MD  traZODone (DESYREL) 150 MG tablet Take 1 tablet (150 mg total) by mouth at bedtime. Patient taking differently: Take 300 mg by mouth at bedtime. 06/21/21   Cheryll Cockayne, MD      Allergies    Patient has no known allergies.    Review of Systems   Review of Systems  Constitutional:  Negative for fever.  Respiratory:  Positive for shortness of breath.   Cardiovascular:  Positive for chest pain.  Gastrointestinal:  Positive for abdominal pain, nausea and vomiting.  Neurological:  Positive for headaches.    Physical Exam Updated Vital Signs BP 118/79 (BP Location: Right Arm)   Pulse 99   Temp 99.4 F (37.4 C) (Oral)   Resp 15   Ht 6\' 4"  (1.93 m)   Wt 108.9 kg   SpO2 96%   BMI 29.21 kg/m  Physical Exam Vitals and nursing note  reviewed.  Constitutional:      General: He is not in acute distress.    Appearance: He is well-developed. He is obese. He is not ill-appearing.  HENT:     Head: Normocephalic.  Eyes:     Conjunctiva/sclera: Conjunctivae normal.  Cardiovascular:     Rate and Rhythm: Normal rate. Rhythm irregularly irregular.     Pulses:          Radial pulses are 2+ on the right side and 2+ on the left side.     Heart sounds: No murmur heard. Pulmonary:     Effort: Pulmonary effort is normal. No respiratory distress.     Breath sounds: Normal breath sounds.  Abdominal:     Palpations: Abdomen is soft.      Tenderness: There is no abdominal tenderness. There is no guarding.  Musculoskeletal:        General: No swelling.     Comments: Bilateral BKA  Skin:    General: Skin is warm and dry.  Neurological:     Mental Status: He is alert.     ED Results / Procedures / Treatments   Labs (all labs ordered are listed, but only abnormal results are displayed) Labs Reviewed  CBC  COMPREHENSIVE METABOLIC PANEL  BRAIN NATRIURETIC PEPTIDE  MAGNESIUM  TROPONIN I (HIGH SENSITIVITY)    EKG EKG Interpretation  Date/Time:  Thursday January 20 2022 17:42:10 EDT Ventricular Rate:  90 PR Interval:    QRS Duration: 104 QT Interval:  440 QTC Calculation: 508 R Axis:   84 Text Interpretation: Atrial fibrillation Ventricular premature complex Borderline right axis deviation Low voltage, extremity leads Prolonged QT interval Confirmed by Alona Bene [phone]) on 01/20/2022 5:46:53 PM Also confirmed by Alona Bene [id number])  on 01/20/2022 5:49:55 PM  Radiology No results found.  Procedures Procedures    Medications Ordered in ED Medications  lactated ringers bolus 1,000 mL (1,000 mLs Intravenous New Bag/Given 01/20/22 1823)    ED Course/ Medical Decision Making/ A&P                           Medical Decision Making Amount and/or Complexity of Data Reviewed Labs: ordered. Radiology: ordered.   ***  {Document critical care time when appropriate:1} {Document review of labs and clinical decision tools ie heart score, Chads2Vasc2 etc:1}  {Document your independent review of radiology images, and any outside records:1} {Document your discussion with family members, caretakers, and with consultants:1} {Document social determinants of health affecting pt's care:1} {Document your decision making why or why not admission, treatments were needed:1} Final Clinical Impression(s) / ED Diagnoses Final diagnoses:  None    Rx / DC Orders ED Discharge Orders     None

## 2022-01-20 NOTE — Discharge Instructions (Addendum)
Your labs today looked normal. You will need to be seen by your primary doctor for close follow-up.

## 2022-01-20 NOTE — ED Notes (Signed)
Pt tolerated PO intake

## 2022-01-20 NOTE — ED Notes (Signed)
Pt provided with food and water

## 2022-01-21 MED ORDER — LIDOCAINE VISCOUS HCL 2 % MT SOLN
15.0000 mL | Freq: Once | OROMUCOSAL | Status: AC
  Administered 2022-01-21: 15 mL via ORAL
  Filled 2022-01-21: qty 15

## 2022-01-21 MED ORDER — ALUM & MAG HYDROXIDE-SIMETH 200-200-20 MG/5ML PO SUSP
30.0000 mL | Freq: Once | ORAL | Status: AC
  Administered 2022-01-21: 30 mL via ORAL
  Filled 2022-01-21: qty 30

## 2022-01-21 MED ORDER — ONDANSETRON HCL 4 MG/2ML IJ SOLN
4.0000 mg | Freq: Once | INTRAMUSCULAR | Status: AC
  Administered 2022-01-21: 4 mg via INTRAVENOUS
  Filled 2022-01-21: qty 2

## 2022-01-21 NOTE — ED Notes (Addendum)
[facility] notified by Milon Score for pt pick up for transport home

## 2022-02-01 ENCOUNTER — Encounter (HOSPITAL_COMMUNITY): Payer: Self-pay

## 2022-02-01 ENCOUNTER — Emergency Department (HOSPITAL_COMMUNITY)
Admission: EM | Admit: 2022-02-01 | Discharge: 2022-02-02 | Disposition: A | Discharge status: Home / Self Care | Payer: Medicare Other | Attending: Emergency Medicine | Admitting: Emergency Medicine

## 2022-02-01 ENCOUNTER — Emergency Department (HOSPITAL_COMMUNITY): Payer: Medicare Other

## 2022-02-01 ENCOUNTER — Other Ambulatory Visit: Payer: Self-pay

## 2022-02-01 DIAGNOSIS — Z79899 Other long term (current) drug therapy: Secondary | ICD-10-CM | POA: Insufficient documentation

## 2022-02-01 DIAGNOSIS — Z7984 Long term (current) use of oral hypoglycemic drugs: Secondary | ICD-10-CM | POA: Insufficient documentation

## 2022-02-01 DIAGNOSIS — S80211A Abrasion, right knee, initial encounter: Secondary | ICD-10-CM | POA: Diagnosis not present

## 2022-02-01 DIAGNOSIS — W050XXA Fall from non-moving wheelchair, initial encounter: Secondary | ICD-10-CM | POA: Insufficient documentation

## 2022-02-01 DIAGNOSIS — S80212A Abrasion, left knee, initial encounter: Secondary | ICD-10-CM | POA: Diagnosis not present

## 2022-02-01 DIAGNOSIS — E119 Type 2 diabetes mellitus without complications: Secondary | ICD-10-CM | POA: Insufficient documentation

## 2022-02-01 DIAGNOSIS — W19XXXA Unspecified fall, initial encounter: Secondary | ICD-10-CM

## 2022-02-01 DIAGNOSIS — E876 Hypokalemia: Secondary | ICD-10-CM | POA: Diagnosis not present

## 2022-02-01 DIAGNOSIS — S8991XA Unspecified injury of right lower leg, initial encounter: Secondary | ICD-10-CM | POA: Diagnosis present

## 2022-02-01 DIAGNOSIS — I10 Essential (primary) hypertension: Secondary | ICD-10-CM | POA: Diagnosis not present

## 2022-02-01 LAB — CBC WITH DIFFERENTIAL/PLATELET
Abs Immature Granulocytes: 0.02 10*3/uL (ref 0.00–0.07)
Basophils Absolute: 0 10*3/uL (ref 0.0–0.1)
Basophils Relative: 1 %
Eosinophils Absolute: 0.3 10*3/uL (ref 0.0–0.5)
Eosinophils Relative: 4 %
HCT: 34.6 % — ABNORMAL LOW (ref 39.0–52.0)
Hemoglobin: 12.1 g/dL — ABNORMAL LOW (ref 13.0–17.0)
Immature Granulocytes: 0 %
Lymphocytes Relative: 15 %
Lymphs Abs: 1.2 10*3/uL (ref 0.7–4.0)
MCH: 33.4 pg (ref 26.0–34.0)
MCHC: 35 g/dL (ref 30.0–36.0)
MCV: 95.6 fL (ref 80.0–100.0)
Monocytes Absolute: 0.6 10*3/uL (ref 0.1–1.0)
Monocytes Relative: 8 %
Neutro Abs: 5.7 10*3/uL (ref 1.7–7.7)
Neutrophils Relative %: 72 %
Platelets: 102 10*3/uL — ABNORMAL LOW (ref 150–400)
RBC: 3.62 MIL/uL — ABNORMAL LOW (ref 4.22–5.81)
RDW: 15.1 % (ref 11.5–15.5)
WBC: 7.8 10*3/uL (ref 4.0–10.5)
nRBC: 0 % (ref 0.0–0.2)

## 2022-02-01 LAB — BASIC METABOLIC PANEL
Anion gap: 9 (ref 5–15)
BUN: 14 mg/dL (ref 8–23)
CO2: 19 mmol/L — ABNORMAL LOW (ref 22–32)
Calcium: 8.9 mg/dL (ref 8.9–10.3)
Chloride: 105 mmol/L (ref 98–111)
Creatinine, Ser: 0.86 mg/dL (ref 0.61–1.24)
GFR, Estimated: >60 mL/min (ref >60)
Glucose, Bld: 163 mg/dL — ABNORMAL HIGH (ref 70–99)
Potassium: 3.1 mmol/L — ABNORMAL LOW (ref 3.5–5.1)
Sodium: 133 mmol/L — ABNORMAL LOW (ref 135–145)

## 2022-02-01 MED ORDER — POTASSIUM CHLORIDE CRYS ER 20 MEQ PO TBCR
40.0000 meq | EXTENDED_RELEASE_TABLET | Freq: Once | ORAL | Status: AC
  Administered 2022-02-01: 40 meq via ORAL
  Filled 2022-02-01: qty 2

## 2022-02-01 NOTE — Social Work (Signed)
CSW was informed by provider that [facility] refused to take the Pt back due to the Pt living in a shelter.Pt has a wheelchair that the Pt reported leaving at the shelter which was [facility]. The Pt can not move at all per reports by the nurse Rosalie Doctor. CSW noted Pt chart to make sure CSW in the am as well RNCM know that the Pt needs a  wheel chair so that he can propelry be discharged.

## 2022-02-01 NOTE — ED Provider Notes (Signed)
[facility]-EMERGENCY DEPT Provider Note   CSN: [id number] Arrival date & time: 02/01/22  2005     History  Chief Complaint  Patient presents with   Leg Pain    Cory Palmer is a 61 y.o. male.  Patient with history of diabetes, hypertension, osteomyelitis status post bilateral BKAs presents today with bilateral stump pain after fall earlier today. States that he is wheelchair bound at baseline and slipped out of his wheelchair earlier today and fell on both of his stumps. He states that he has chronic pain in his legs from BKAs but the fall made it worse and prompted his visit today. He did not strike his head or loose consciousness. Denies any other injuries from fall. Denies fevers or chills.  The history is provided by the patient. No language interpreter was used.  Leg Pain      Home Medications Prior to Admission medications   Medication Sig Start Date End Date Taking? Authorizing Provider  carvedilol (COREG) 12.5 MG tablet Take 1 tablet by mouth 2 times daily with a meal. 11/18/21   Danford, Earl Lites, MD  docusate sodium (COLACE) 100 MG capsule Take 100 mg by mouth 2 (two) times daily as needed for mild constipation.    [provider]  ferrous sulfate 325 (65 FE) MG tablet Take 1 tablet (325 mg total) by mouth daily with breakfast. 11/18/21   Danford, Earl Lites, MD  lactulose (CHRONULAC) 10 GM/15ML solution Take 45 mLs by mouth 3 times daily. 11/18/21   Danford, Earl Lites, MD  metFORMIN (GLUCOPHAGE) 1000 MG tablet Take 1 tablet (1,000 mg total) by mouth 2 (two) times daily with a meal. Patient taking differently: Take 1,000 mg by mouth daily with breakfast. 06/21/21 11/12/21  Cheryll Cockayne, MD  Multiple Vitamin (MULTIVITAMIN WITH MINERALS) TABS tablet Take 1 tablet by mouth daily. 05/11/18   Danford, Earl Lites, MD  omeprazole (PRILOSEC) 20 MG capsule Take 20 mg by mouth daily as needed (for GERD-like symptoms).    [provider]  QUEtiapine (SEROQUEL) 300 MG tablet Take 1 tablet (300 mg total) by mouth at bedtime. Patient taking differently: Take 600 mg by mouth at bedtime. 04/12/21   Palumbo, April, MD  sertraline (ZOLOFT) 50 MG tablet Take 1 tablet (50 mg total) by mouth daily. 06/21/21   Cheryll Cockayne, MD  traZODone (DESYREL) 150 MG tablet Take 1 tablet (150 mg total) by mouth at bedtime. 01/20/22   Causey, [place], MD      Allergies    Patient has no known allergies.    Review of Systems   Review of Systems  Skin:  Positive for wound.  All other systems reviewed and are negative.   Physical Exam Updated Vital Signs BP 99/67   Pulse 94   Temp 98.1 F (36.7 C) (Oral)   Resp (!) 26   SpO2 95%  Physical Exam Vitals and nursing note reviewed.  Constitutional:      General: He is not in acute distress.    Appearance: Normal appearance. He is normal weight. He is not ill-appearing, toxic-appearing or diaphoretic.  HENT:     Head: Normocephalic and atraumatic.  Cardiovascular:     Rate and Rhythm: Normal rate.  Pulmonary:     Effort: Pulmonary effort is normal. No respiratory distress.  Musculoskeletal:        General: Normal range of motion.     Cervical back: Normal range of motion.     Comments:  Bilateral superficial abrasions noted to the ends of both stumps. No obvious deformity noted. No edema, warmth, purulence, or bruising noted.   No tenderness to palpation of cervical, thoracic, or lumbar spine. No tenderness to palpation of bilateral hips or knees. Distal sensation intact. 5/5 strength intact to bilateral lower extremities  Skin:    General: Skin is warm and dry.  Neurological:     General: No focal deficit present.     Mental Status: He is alert.  Psychiatric:        Mood and Affect: Mood normal.        Behavior: Behavior normal.     ED Results / Procedures / Treatments   Labs (all labs ordered are listed, but only abnormal results are displayed) Labs Reviewed  CBC  WITH DIFFERENTIAL/PLATELET - Abnormal; Notable for the following components:      Result Value   RBC 3.62 (*)    Hemoglobin 12.1 (*)    HCT 34.6 (*)    Platelets 102 (*)    All other components within normal limits  BASIC METABOLIC PANEL - Abnormal; Notable for the following components:   Sodium 133 (*)    Potassium 3.1 (*)    CO2 19 (*)    Glucose, Bld 163 (*)    All other components within normal limits    EKG None  Radiology DG Knee Complete 4 Views Left  Result Date: 02/01/2022 CLINICAL DATA:  Fall.  Bilateral stump pain. EXAM: LEFT KNEE - COMPLETE 4+ VIEW COMPARISON:  CT left knee 11/19/2019. FINDINGS: Postoperative changes of below-the-knee amputation at the level of the proximal tibial shaft. Small amount of heterotopic ossification is noted of the level of the amputation. No evidence of fracture, dislocation, or joint effusion. Mild tricompartmental osteoarthritis. Subcutaneous edema is noted in the soft tissues overlying the tibial amputation site. IMPRESSION: No acute osseous abnormality. Subcutaneous edema noted in the soft tissues overlying the tibial amputation site. Electronically Signed   By: Ileana Roup M.D.   On: 02/01/2022 21:31   DG Knee Complete 4 Views Right  Result Date: 02/01/2022 CLINICAL DATA:  Knee pain, status post fall. EXAM: RIGHT KNEE - COMPLETE 4+ VIEW COMPARISON:  May 07, 2018 FINDINGS: There is prior below the knee amputation, without evidence of an acute fracture or dislocation. Moderate severity medial and lateral tibiofemoral compartment space narrowing is seen. There is a small joint effusion. IMPRESSION: 1. Prior below the knee amputation without evidence of acute fracture. 2. Moderate severity degenerative changes. 3. Small joint effusion. Electronically Signed   By: Virgina Norfolk M.D.   On: 02/01/2022 21:29    Procedures Procedures    Medications Ordered in ED Medications  potassium chloride SA (KLOR-CON M) CR tablet 40 mEq (has no  administration in time range)    ED Course/ Medical Decision Making/ A&P                           Medical Decision Making Amount and/or Complexity of Data Reviewed Labs: ordered. Radiology: ordered.  Risk Prescription drug management.   Patient presents today with bilateral stump pain from a fall from his wheelchair earlier today.  He is afebrile, nontoxic-appearing, and in distress with reassuring vital signs.  Physical exam reveals 2 superficial abrasions that are noninfectious appearing present on the and both stumps.  No active bleeding or signs of retained foreign body.  Both of these wounds have been cleaned and dressings applied to same.  Laboratory evaluation reveals hemoglobin of 12.1 which is consistent with patient's baseline.  Platelets of 102 also consistent with baseline.  NA 133, K 3.1.  Glucose 163. Patient given oral potassium for hypokalemia.   X-ray imaging obtained of both stumps which revealed no acute osseous abnormalities. Mild amount of edema noted to the left stump over amputation site.  I have personally reviewed and interpreted this imaging and agree with radiology interpretation.  Given overall unchanged labs and imaging unremarkable for acute findings, he is stable for discharge at this time. Patient is understanding and amenable with plan, educated on red flag symptoms that would prompt immediate return. Discharged in stable condition.   Final Clinical Impression(s) / ED Diagnoses Final diagnoses:  Fall, initial encounter  Hypokalemia    Rx / DC Orders ED Discharge Orders     None     An After Visit Summary was printed and given to the patient.     Vear Clock 02/01/22 2201    Maia Plan, MD 02/01/22 [phone]

## 2022-02-01 NOTE — Discharge Instructions (Addendum)
As we discussed, your work-up in the ER today was reassuring for acute abnormalities.  Laboratory evaluation and x-ray imaging did not show any emergent concerns today.  You did have slightly low potassium and this was replaced in the ER today.  You will need to schedule an appointment to have this rechecked at your primary care office in the next few days.  Return if development of any new or worsening symptoms.

## 2022-02-01 NOTE — ED Notes (Signed)
[facility] called for pt. Transportation home 

## 2022-02-01 NOTE — ED Triage Notes (Signed)
Pt. BIB [facility] c/o lower extremity pain. Per EMS pt. Fell earlier and refused EMS, but called back due to the pain. Pt. Is a bilateral BKA pt. Hx of diabetes.  EMS VS: BP:124/68 HR:100 O2: 97%RA CBG: 169 RR:18

## 2022-02-02 NOTE — ED Notes (Signed)
Pt given a blue disposable shirt

## 2022-02-02 NOTE — ED Notes (Signed)
Unable to E-sign d/t lack of equipment. Discussed D/C instructions with patient, including findings, follow-up and return precautions. Pt. Verbalized understanding of all of the above. Pt/ provided CAB voucher and wheeled out of department in NAD.

## 2022-02-02 NOTE — Progress Notes (Signed)
CSW called [facility] to assist with getting pt's wheelchair.   11:00am  CSW received pt's wheelchair, pt can receive taxi voucher to the [facility]. Pt was informed that he must bring his wheelchair whenever he has to visit the hospital. TOC sign off.   Valentina Shaggy.Shavon Ashmore, MSW, LCSWA [facility]  Transitions of Care Clinical Social Worker I Direct Dial: [phone]  Fax: [phone] [email or web address]

## 2022-02-12 ENCOUNTER — Ambulatory Visit (HOSPITAL_COMMUNITY)
Admission: EM | Admit: 2022-02-12 | Discharge: 2022-02-12 | Disposition: A | Discharge status: Home / Self Care | Payer: Medicare Other | Attending: Psychiatry | Admitting: Psychiatry

## 2022-02-12 DIAGNOSIS — R45850 Homicidal ideations: Secondary | ICD-10-CM | POA: Insufficient documentation

## 2022-02-12 DIAGNOSIS — Z5900 Homelessness unspecified: Secondary | ICD-10-CM | POA: Diagnosis not present

## 2022-02-12 DIAGNOSIS — R45851 Suicidal ideations: Secondary | ICD-10-CM | POA: Diagnosis not present

## 2022-02-12 NOTE — ED Provider Notes (Signed)
Behavioral Health Urgent Care Medical Screening Exam  Patient Name: Cory Palmer MRN: [id number] Date of Evaluation: 02/12/22 Chief Complaint:   Diagnosis:  Final diagnoses:  Homeless   History of Present illness: Cory Palmer is a 61 y.o. male. Pt presents voluntarily to [facility] behavioral health for walk-in assessment w/ police escort. Pt is assessed face-to-face by nurse practitioner.   Pt reports he called police to the [facility] today due to suicidal ideations and homicidal ideations regarding chronic housing instability. He reports suicidal and homicidal ideations are chronic in nature. He denies he has a plan, intent, or means. Pt reports he is currently financially unstable. Pt denies hx of SA or NSSI. When asked about NSSI, he states, he does have scratches from use of his wheelchair, although this is not intentional.  Discussed discharge with resources for homeless shelters. Pt states he cannot go to [facility] due to past incident there. Pt states he will return to the [facility]. He is future oriented, states he has to reschedule a doctors appointment. Pt states he is okay with plan to discharge back to the [facility], will meet people there to spend the day at the park. Pt denies access to a firearm or other weapon. Pt denies substance use.  Pt is a&ox3, in no acute distress, non-toxic appearing. Pt presents disheveled, w/ good eye contact, speech clear and coherent, w/ nml rate/volume. Reported mood is depressed, anxious. Affect is congruent. TP is coherent, goal directed, linear. Description of associations is intact. TC is logical. There is no evidence of responding to internal stimuli, agitation, aggression or distractibility. No delusions or paranoia elicited. Pt is calm, cooperative.   Psychiatric Specialty Exam  Presentation  General Appearance:Disheveled  Eye Contact:Good  Speech:Clear and Coherent; Normal Rate  Speech Volume:Normal  Handedness:Right   Mood and Affect   Mood:Depressed; Anxious  Affect:Congruent   Thought Process  Thought Processes:Coherent; Goal Directed; Linear  Descriptions of Associations:Intact  Orientation:Full (Time, Place and Person)  Thought Content:Logical  Diagnosis of Schizophrenia or Schizoaffective disorder in past: No   Hallucinations:None  Ideas of Reference:None  Suicidal Thoughts:Yes, Passive  Homicidal Thoughts:Yes, Passive   Sensorium  Memory:Immediate Fair; Recent Fair  Judgment:Intact Insight:Shallow   Executive Functions  Concentration:Fair Attention Span:Fair  Recall:Fair  Fund of Knowledge:Fair Language:Fair   Psychomotor Activity  Psychomotor Activity:Normal   Assets  Assets:Desire for Improvement; Manufacturing systems engineer; Financial Resources/Insurance   Sleep  Sleep:Fair  Number of hours: No data recorded  No data recorded  Physical Exam: Physical Exam Cardiovascular:     Rate and Rhythm: Normal rate.  Pulmonary:     Effort: Pulmonary effort is normal.  Neurological:     Mental Status: He is alert and oriented to person, place, and time.  Psychiatric:        Attention and Perception: Attention and perception normal.        Mood and Affect: Mood is anxious and depressed.    Review of Systems  Constitutional:  Negative for chills and fever.  Respiratory:  Negative for shortness of breath.   Cardiovascular:  Negative for chest pain.  Gastrointestinal:  Negative for abdominal pain.  Neurological:  Negative for dizziness and headaches.  Psychiatric/Behavioral:  Positive for depression and suicidal ideas. The patient is nervous/anxious.    Blood pressure 120/73, pulse 86, temperature 98.7 F (37.1 C), temperature source Oral, resp. rate 19, SpO2 99 %. There is no height or weight on file to calculate BMI.  Musculoskeletal: Strength & Muscle Tone:  within normal limits Gait & Station: bilateral below knee amputations, utilizes wheelchair  Patient leans: N/A  [facility]  MSE Discharge Disposition for Follow up and Recommendations: Based on my evaluation the patient does not appear to have an emergency medical condition and can be discharged with resources and follow up care in outpatient services   Lauree Chandler, NP 02/12/2022, 7:34 PM

## 2022-02-12 NOTE — Discharge Instructions (Addendum)
Homeless Shelter List:  [facility] ([facility]) [address] [place], [place] Phone: [phone]  [facility] [address], [place], [place] [address] Phone: [phone]  [facility] (Women only) [address], [place], [place] [address] Phone: [phone]  [facility] [address] [place], [place] [address] Phone: [phone]  [facility]: [address] [place], [place] [address] Phone: [phone]  [facility] [address], [place], [place] [address] (Check in at 6:00PM for placement at a local shelter) Phone: [phone] 

## 2022-03-12 ENCOUNTER — Other Ambulatory Visit: Payer: Self-pay

## 2022-03-12 ENCOUNTER — Encounter (HOSPITAL_COMMUNITY): Payer: Self-pay | Admitting: *Deleted

## 2022-03-12 ENCOUNTER — Emergency Department (HOSPITAL_COMMUNITY)
Admission: EM | Admit: 2022-03-12 | Discharge: 2022-03-15 | Disposition: A | Discharge status: Home / Self Care | Payer: Medicare Other | Attending: Emergency Medicine | Admitting: Emergency Medicine

## 2022-03-12 ENCOUNTER — Emergency Department (HOSPITAL_COMMUNITY): Payer: Medicare Other

## 2022-03-12 DIAGNOSIS — Z59819 Housing instability, housed unspecified: Secondary | ICD-10-CM

## 2022-03-12 DIAGNOSIS — J069 Acute upper respiratory infection, unspecified: Secondary | ICD-10-CM

## 2022-03-12 DIAGNOSIS — Z20822 Contact with and (suspected) exposure to covid-19: Secondary | ICD-10-CM | POA: Insufficient documentation

## 2022-03-12 DIAGNOSIS — R059 Cough, unspecified: Secondary | ICD-10-CM | POA: Diagnosis present

## 2022-03-12 LAB — COMPREHENSIVE METABOLIC PANEL
ALT: 13 U/L (ref 0–44)
AST: 20 U/L (ref 15–41)
Albumin: 3.9 g/dL (ref 3.5–5.0)
Alkaline Phosphatase: 96 U/L (ref 38–126)
Anion gap: 9 (ref 5–15)
BUN: 13 mg/dL (ref 8–23)
CO2: 22 mmol/L (ref 22–32)
Calcium: 10.2 mg/dL (ref 8.9–10.3)
Chloride: 107 mmol/L (ref 98–111)
Creatinine, Ser: 1.05 mg/dL (ref 0.61–1.24)
GFR, Estimated: >60 mL/min (ref >60)
Glucose, Bld: 105 mg/dL — ABNORMAL HIGH (ref 70–99)
Potassium: 4.2 mmol/L (ref 3.5–5.1)
Sodium: 138 mmol/L (ref 135–145)
Total Bilirubin: 2.4 mg/dL — ABNORMAL HIGH (ref 0.3–1.2)
Total Protein: 7.1 g/dL (ref 6.5–8.1)

## 2022-03-12 LAB — CBC WITH DIFFERENTIAL/PLATELET
Abs Immature Granulocytes: 0.03 10*3/uL (ref 0.00–0.07)
Basophils Absolute: 0.1 10*3/uL (ref 0.0–0.1)
Basophils Relative: 1 %
Eosinophils Absolute: 0.4 10*3/uL (ref 0.0–0.5)
Eosinophils Relative: 4 %
HCT: 39.5 % (ref 39.0–52.0)
Hemoglobin: 14 g/dL (ref 13.0–17.0)
Immature Granulocytes: 0 %
Lymphocytes Relative: 21 %
Lymphs Abs: 2.1 10*3/uL (ref 0.7–4.0)
MCH: 33.8 pg (ref 26.0–34.0)
MCHC: 35.4 g/dL (ref 30.0–36.0)
MCV: 95.4 fL (ref 80.0–100.0)
Monocytes Absolute: 1.2 10*3/uL — ABNORMAL HIGH (ref 0.1–1.0)
Monocytes Relative: 11 %
Neutro Abs: 6.5 10*3/uL (ref 1.7–7.7)
Neutrophils Relative %: 63 %
Platelets: 151 10*3/uL (ref 150–400)
RBC: 4.14 MIL/uL — ABNORMAL LOW (ref 4.22–5.81)
RDW: 13.9 % (ref 11.5–15.5)
WBC: 10.3 10*3/uL (ref 4.0–10.5)
nRBC: 0 % (ref 0.0–0.2)

## 2022-03-12 LAB — LIPASE, BLOOD: Lipase: 40 U/L (ref 11–51)
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
## 2022-03-13 LAB — SARS CORONAVIRUS 2 BY RT PCR: SARS Coronavirus 2 by RT PCR: NEGATIVE

## 2022-03-13 MED ORDER — BENZONATATE 100 MG PO CAPS
100.0000 mg | ORAL_CAPSULE | Freq: Two times a day (BID) | ORAL | 0 refills | Status: DC | PRN
  Filled 2022-03-13: qty 20, 10d supply, fill #0

## 2022-03-13 NOTE — Care Management (Signed)
EDCM confirmed with the patient's RN he currently is insured with [facility] Medicare and Medicaid per the patient. EDCM is unable to provide medication cost assistance through [facility] given the patient confirms he is insured.   CSW confirmed with the patient he obtained his wheelchair within the last year to year and a half. Currently it is too soon for insurance to pay for a new wheelchair. CSW is attempting to assist the patient in locating this wheelchair left near the [facility].

## 2022-03-13 NOTE — ED Provider Notes (Signed)
Emergency Medicine Observation Re-evaluation Note  Cory Palmer is a 61 y.o. male, seen on rounds today.  Pt initially presented to the ED for complaints of Cough  Patient did well without acute events overnight.  They are currently searching for his wheelchair for placement. Physical Exam  BP 115/75   Pulse 66   Temp 98.9 F (37.2 C) (Oral)   Resp (!) 24   Ht 5\' 8"  (1.727 m)   Wt 108.9 kg   SpO2 95%   BMI 36.49 kg/m  Physical Exam General: Resting comfortably in stretcher Lungs: Normal work of breathing Psych: Calm with normal mood  ED Course / MDM  EKG:EKG Interpretation  Date/Time:  Saturday March 12 2022 17:59:28 EDT Ventricular Rate:  89 PR Interval:    QRS Duration: 94 QT Interval:  394 QTC Calculation: 479 R Axis:   47 Text Interpretation: Atrial fibrillation ST & T wave abnormality, consider anterior ischemia Prolonged QT Abnormal ECG When compared with ECG of 20-Jan-2022 17:42, PREVIOUS ECG IS PRESENT Confirmed by 22-Jan-2022 [phone]) on 03/13/2022 9:16:57 AM  I have reviewed the labs performed to date as well as medications administered while in observation.  Recent changes in the last 24 hours include no acute events.  Patient is homeless and they are currently trying to locate a wheelchair for him.  Plan  Current plan is to fu with social work about wheelchair.  Cory Palmer is not under involuntary commitment.     [name], MD 03/14/22 [phone]

## 2022-03-13 NOTE — ED Provider Notes (Signed)
[facility]-EMERGENCY DEPT [facility] Emergency Department Provider Note MRN:  [id number]  Arrival date & time: 03/13/22     Chief Complaint   Cough   History of Present Illness   Cory Palmer is a 61 y.o. year-old male presents to the ED with chief complaint of cough and congestion for the past few days.  He denies any fevers or chills.  Denies lightheadedness or dizziness.  He states that he is homeless and is currently staying at the [facility].  He also states that he is out of all of his regular medications and asks if a Child psychotherapist can help him get them refilled.  History provided by patient.   Review of Systems  Pertinent review of systems noted in HPI.    Physical Exam   Vitals:   03/13/22 0028 03/13/22 0030  BP:  98/68  Pulse:  94  Resp:    Temp: 98.2 F (36.8 C)   SpO2:  97%    CONSTITUTIONAL:  well-appearing, NAD NEURO:  Alert and oriented x 3, CN 3-12 grossly intact EYES:  eyes equal and reactive ENT/NECK:  Supple, no stridor  CARDIO:  normal rate, regular rhythm, appears well-perfused  PULM:  No respiratory distress, CTAB GI/GU:  non-distended,  MSK/SPINE:  Bilateral BKAs SKIN:  no rash, atraumatic   *Additional and/or pertinent findings included in MDM below  Diagnostic and Interventional Summary    EKG Interpretation  Date/Time:    Ventricular Rate:    PR Interval:    QRS Duration:   QT Interval:    QTC Calculation:   R Axis:     Text Interpretation:         Labs Reviewed  CBC WITH DIFFERENTIAL/PLATELET - Abnormal; Notable for the following components:      Result Value   RBC 4.14 (*)    Monocytes Absolute 1.2 (*)    All other components within normal limits  COMPREHENSIVE METABOLIC PANEL - Abnormal; Notable for the following components:   Glucose, Bld 105 (*)    Total Bilirubin 2.4 (*)    All other components within normal limits  SARS CORONAVIRUS 2 BY RT PCR  LIPASE, BLOOD    DG Chest 2 View  Final Result      Medications - No  data to display   Procedures  /  Critical Care Procedures  ED Course and Medical Decision Making  I have reviewed the triage vital signs, the nursing notes, and pertinent available records from the EMR.  Social Determinants Affecting Complexity of Care: Patient has no clinically significant social determinants affecting this chief complaint..   ED Course:    Medical Decision Making Patient here with cough and cold symptoms for the past few days.  Laboratory work-up and chest x-ray ordered in triage are reassuring.  We will add COVID.  Will consult TOC to see if they can help the patient get his medications refilled.  Amount and/or Complexity of Data Reviewed Labs:     Details: No significant electrolyte derangement Radiology: independent interpretation performed.    Details: No obvious opacity  Risk Prescription drug management.     Consultants: No consultations were needed in caring for this patient.   Treatment and Plan: Emergency department workup does not suggest an emergent condition requiring admission or immediate intervention beyond  what has been performed at this time. The patient is safe for discharge and has  been instructed to return immediately for worsening symptoms, change in  symptoms or any other concerns  Final Clinical Impressions(s) / ED Diagnoses     ICD-10-CM   1. Viral URI with cough  J06.9       ED Discharge Orders     None         Discharge Instructions Discussed with and Provided to Patient:   Discharge Instructions   None      Roxy Horseman, PA-C 03/13/22 2542    Mesner, Barbara Cower, MD 03/13/22 [phone]

## 2022-03-13 NOTE — Discharge Instructions (Signed)
You COVID test was negative.  Your symptoms are consistent with an upper respiratory tract infection.  This doesn't require antibiotics.  Please followup with your doctor.  I've prescribed some cough medicine for you.  I've placed an order to have our social worker contact you next week about your medications.

## 2022-03-13 NOTE — ED Notes (Signed)
[employer]. Trying to reach watch commander at this time. This RN is requesting [employer] to help transport this pt to [facility]. Wheelchair and belongings are outside building so [facility] does not need to be open for this patient to return. Number left with metro- they will be calling back.

## 2022-03-13 NOTE — Progress Notes (Addendum)
Patient is homeless and stays outside of the [facility]. Patient arrived via EMS who left wheelchair outside of [facility] and is refusing to transport patient back because it is not a home address.   Patient reports his wheelchair is underneath the awning at [facility]. The initial plan was to have someone meet him at the [facility] when transported back via [facility] however wheelchair is not on location and is unclear what happened to it.   CSW reached out to [facility] who is looking into the situation regarding wheelchair eligibility.   11:02a: RNCM notes patient not eligible for insurance to cover a new wheelchair. At this time [facility] will have to follow-up with the [facility] when they are open to see if they can locate it in the facility.

## 2022-03-13 NOTE — ED Notes (Signed)
Pt provided with [place] sandwich tray and sprite.

## 2022-03-14 ENCOUNTER — Other Ambulatory Visit (HOSPITAL_COMMUNITY): Payer: Self-pay

## 2022-03-14 MED ORDER — TRAZODONE HCL 100 MG PO TABS
150.0000 mg | ORAL_TABLET | Freq: Every day | ORAL | Status: DC
  Administered 2022-03-14: 150 mg via ORAL
  Filled 2022-03-14: qty 1

## 2022-03-14 MED ORDER — LACTULOSE 10 GM/15ML PO SOLN
30.0000 g | Freq: Three times a day (TID) | ORAL | Status: DC
  Administered 2022-03-14 – 2022-03-15 (×3): 30 g via ORAL
  Filled 2022-03-14 (×3): qty 60

## 2022-03-14 MED ORDER — DOCUSATE SODIUM 100 MG PO CAPS: 100.0000 mg | ORAL_CAPSULE | Freq: Two times a day (BID) | ORAL | Status: DC | PRN

## 2022-03-14 MED ORDER — METFORMIN HCL 500 MG PO TABS
1000.0000 mg | ORAL_TABLET | Freq: Every day | ORAL | Status: DC
Start: 2022-03-15 — End: 2022-03-15
  Administered 2022-03-15: 1000 mg via ORAL
  Filled 2022-03-14: qty 2

## 2022-03-14 MED ORDER — FERROUS SULFATE 325 (65 FE) MG PO TABS
325.0000 mg | ORAL_TABLET | Freq: Every day | ORAL | Status: DC
  Administered 2022-03-15: 325 mg via ORAL
  Filled 2022-03-14: qty 1

## 2022-03-14 MED ORDER — CARVEDILOL 12.5 MG PO TABS
12.5000 mg | ORAL_TABLET | Freq: Two times a day (BID) | ORAL | Status: DC
Start: 2022-03-14 — End: 2022-03-15
  Administered 2022-03-14 – 2022-03-15 (×2): 12.5 mg via ORAL
  Filled 2022-03-14 (×2): qty 1

## 2022-03-14 MED ORDER — QUETIAPINE FUMARATE 200 MG PO TABS
600.0000 mg | ORAL_TABLET | Freq: Every day | ORAL | Status: DC
  Administered 2022-03-14: 600 mg via ORAL
  Filled 2022-03-14: qty 3

## 2022-03-14 MED ORDER — SERTRALINE HCL 50 MG PO TABS
50.0000 mg | ORAL_TABLET | Freq: Every day | ORAL | Status: DC
  Administered 2022-03-14 – 2022-03-15 (×2): 50 mg via ORAL
  Filled 2022-03-14 (×2): qty 1

## 2022-03-14 MED ORDER — PANTOPRAZOLE SODIUM 40 MG PO TBEC
40.0000 mg | DELAYED_RELEASE_TABLET | Freq: Every day | ORAL | Status: DC
  Administered 2022-03-14 – 2022-03-15 (×2): 40 mg via ORAL
  Filled 2022-03-14 (×2): qty 1

## 2022-03-14 NOTE — Progress Notes (Signed)
RNCM notes that pt awaiting wheelchair delivery from [facility].  [facility] currently closed, RNCM rode by [facility] and noticed an unattended wheelchair in the resident area (near "bus stop shelter") but could not confirm who wheelchair belonged to.  RNCM will contact [facility] staff in AM for assistance.

## 2022-03-14 NOTE — ED Notes (Signed)
Pt alert, NAD, calm, interactive, resps e/u, speaking in clear complete sentences. Updated. Denies questions or needs, sx or complaints. Watching TV.

## 2022-03-14 NOTE — ED Provider Notes (Signed)
Emergency Medicine Observation Re-evaluation Note  Cory Palmer is a 61 y.o. male, seen on rounds today.  Pt initially presented to the ED for complaints of Cough Currently, the patient is homeless and without a wheelchair awaiting social work disposition and a wheelchair.  Physical Exam  BP 124/65 (BP Location: Right Arm)   Pulse 84   Temp 98.5 F (36.9 C) (Oral)   Resp (!) 22   Ht 5\' 8"  (1.727 m)   Wt 108.9 kg   SpO2 96%   BMI 36.49 kg/m  Physical Exam General: No acute distress laying in bed comfortably Lungs: Breathing comfortably satting 96% on room air Psych: Calm and not agitated  ED Course / MDM  EKG:EKG Interpretation  Date/Time:  Saturday March 12 2022 17:59:28 EDT Ventricular Rate:  89 PR Interval:    QRS Duration: 94 QT Interval:  394 QTC Calculation: 479 R Axis:   47 Text Interpretation: Atrial fibrillation ST & T wave abnormality, consider anterior ischemia Prolonged QT Abnormal ECG When compared with ECG of 20-Jan-2022 17:42, PREVIOUS ECG IS PRESENT Confirmed by 22-Jan-2022 [phone]) on 03/13/2022 9:16:57 AM  I have reviewed the labs performed to date as well as medications administered while in observation.  Recent changes in the last 24 hours include no changes still awaiting social work disposition.  Plan  Current plan is for social work disposition and wheelchair.  Cory Palmer is not under involuntary commitment.     [name], MD 03/14/22 [phone]

## 2022-03-14 NOTE — ED Notes (Signed)
EDPA/ EDP rounding 

## 2022-03-15 NOTE — Discharge Planning (Signed)
RNCM called [facility]) to confirm that pt wheelchair was there and could be picked up.  RNCM contacted [facility] to retrieve wheelchair and bring to ED.

## 2022-03-15 NOTE — ED Provider Notes (Signed)
Emergency Medicine Observation Re-evaluation Note  Cory Palmer is a 61 y.o. male, seen on rounds today.  Pt initially presented to the ED for complaints of Cough Pt with extended ed stay trying to obtain his wheelchair. Discussed w TOC - they will get wheelchair.   Physical Exam  BP (!) 92/58 (BP Location: Left Arm)   Pulse 69   Temp 97.6 F (36.4 C) (Oral)   Resp 18   Ht 1.727 m (5\' 8" )   Wt 108.9 kg   SpO2 92%   BMI 36.49 kg/m  Physical Exam General: alert, content, no c/o this AM.  Cardiac: regular rate Lungs: breathing comfortably Psych: normal mood and affect.   ED Course / MDM    I have reviewed the labs performed to date as well as medications administered while in observation.  Recent changes in the last 24 hours include ED obs, reassessment.   Plan  Current plan is for TOC to get wheelchair and d/c.  Rec close pcp f/u.       , MD 03/15/22 [phone]

## 2022-03-15 NOTE — ED Notes (Signed)
Pt given bus pass ?

## 2022-03-17 ENCOUNTER — Other Ambulatory Visit (HOSPITAL_COMMUNITY): Payer: Self-pay

## 2022-03-18 ENCOUNTER — Other Ambulatory Visit (HOSPITAL_COMMUNITY): Payer: Self-pay

## 2022-03-18 ENCOUNTER — Encounter (HOSPITAL_COMMUNITY): Payer: Self-pay | Admitting: Emergency Medicine

## 2022-03-19 ENCOUNTER — Encounter (HOSPITAL_COMMUNITY): Payer: Self-pay

## 2022-03-19 ENCOUNTER — Emergency Department (HOSPITAL_COMMUNITY): Payer: Medicare Other

## 2022-03-19 ENCOUNTER — Other Ambulatory Visit: Payer: Self-pay

## 2022-03-19 ENCOUNTER — Emergency Department (HOSPITAL_COMMUNITY)
Admission: EM | Admit: 2022-03-19 | Discharge: 2022-03-19 | Disposition: A | Discharge status: Home / Self Care | Payer: Medicare Other | Attending: Student | Admitting: Student

## 2022-03-19 DIAGNOSIS — E119 Type 2 diabetes mellitus without complications: Secondary | ICD-10-CM | POA: Insufficient documentation

## 2022-03-19 DIAGNOSIS — R0789 Other chest pain: Secondary | ICD-10-CM | POA: Insufficient documentation

## 2022-03-19 DIAGNOSIS — I10 Essential (primary) hypertension: Secondary | ICD-10-CM | POA: Diagnosis not present

## 2022-03-19 DIAGNOSIS — R0989 Other specified symptoms and signs involving the circulatory and respiratory systems: Secondary | ICD-10-CM

## 2022-03-19 DIAGNOSIS — R059 Cough, unspecified: Secondary | ICD-10-CM | POA: Diagnosis present

## 2022-03-19 MED ORDER — GUAIFENESIN 200 MG PO TABS
200.0000 mg | ORAL_TABLET | Freq: Three times a day (TID) | ORAL | 0 refills | Status: DC | PRN
  Filled 2022-03-19: qty 30, 10d supply, fill #0

## 2022-03-19 MED ORDER — BENZONATATE 100 MG PO CAPS: 100.0000 mg | ORAL_CAPSULE | Freq: Two times a day (BID) | ORAL | 0 refills | Status: DC | PRN

## 2022-03-19 NOTE — ED Triage Notes (Signed)
Patient arrived by EMS with complaint of ongoing eye watering and cough; seen last Saturday for same. Patient states that he is tired of sleeping outside during the day at [facility]. NAD

## 2022-03-19 NOTE — ED Notes (Signed)
Xray at BS 

## 2022-03-19 NOTE — ED Notes (Signed)
[facility] called to transport patient, [facility] called back to inform us they will not transport patient to the [facility]. Relayed message to [employer]

## 2022-03-19 NOTE — Discharge Instructions (Signed)
Fortunately x-ray of your chest did not show any signs of pneumonia.  Please take medication prescribed as needed for your symptoms.  Follow-up with your doctor for further care.

## 2022-03-19 NOTE — ED Notes (Addendum)
Pt alert, NAD, calm, interactive, resps e/u. VS and xray reviewed. Speaking with PA re results and plan.

## 2022-03-19 NOTE — ED Notes (Signed)
EDP into room, at BS.  ?

## 2022-03-19 NOTE — ED Notes (Signed)
Advised by secretary that safe transport will be taking the pt back to [facility].

## 2022-03-19 NOTE — ED Notes (Signed)
Pending transport to [facility], moved to room [address]. Pending [facility] or other.

## 2022-03-19 NOTE — ED Provider Notes (Signed)
[facility] EMERGENCY DEPARTMENT Provider Note   CSN: [id number] Arrival date & time: 03/19/22  1347     History  Chief Complaint  Patient presents with   cough    Cory Palmer is a 61 y.o. male.  The history is provided by the patient and medical records. No language interpreter was used.     61 year old male with significant history of homelessness, diabetes, bipolar, hypertension, paroxysmal atrial fibrillation, liver cirrhosis brought here via EMS with complaints of cough.  Home Medications Prior to Admission medications   Medication Sig Start Date End Date Taking? Authorizing Provider  benzonatate (TESSALON) 100 MG capsule Take 1 capsule (100 mg total) by mouth 2 (two) times daily as needed for cough. 03/13/22   Roxy Horseman, PA-C  carvedilol (COREG) 12.5 MG tablet Take 1 tablet by mouth 2 times daily with a meal. 11/18/21   Danford, Earl Lites, MD  docusate sodium (COLACE) 100 MG capsule Take 100 mg by mouth 2 (two) times daily as needed for mild constipation.    [provider]  ferrous sulfate 325 (65 FE) MG tablet Take 1 tablet (325 mg total) by mouth daily with breakfast. 11/18/21   Danford, Earl Lites, MD  lactulose (CHRONULAC) 10 GM/15ML solution Take 45 mLs by mouth 3 times daily. 11/18/21   Danford, Earl Lites, MD  metFORMIN (GLUCOPHAGE) 1000 MG tablet Take 1 tablet (1,000 mg total) by mouth 2 (two) times daily with a meal. Patient taking differently: Take 1,000 mg by mouth daily with breakfast. 06/21/21 11/12/21  Cheryll Cockayne, MD  Multiple Vitamin (MULTIVITAMIN WITH MINERALS) TABS tablet Take 1 tablet by mouth daily. 05/11/18   Danford, Earl Lites, MD  omeprazole (PRILOSEC) 20 MG capsule Take 20 mg by mouth daily as needed (for GERD-like symptoms).    [provider]  QUEtiapine (SEROQUEL) 300 MG tablet Take 1 tablet (300 mg total) by mouth at bedtime. Patient taking differently: Take 600 mg by mouth at bedtime.  04/12/21   Palumbo, April, MD  sertraline (ZOLOFT) 50 MG tablet Take 1 tablet (50 mg total) by mouth daily. 06/21/21   Cheryll Cockayne, MD  traZODone (DESYREL) 150 MG tablet Take 1 tablet (150 mg total) by mouth at bedtime. 01/20/22   Causey, [place], MD      Allergies    Patient has no known allergies.    Review of Systems   Review of Systems  All other systems reviewed and are negative.   Physical Exam Updated Vital Signs BP (!) 130/91 (BP Location: Left Arm)   Pulse (!) 55   Temp 98.1 F (36.7 C) (Oral)   Resp 17   SpO2 97%  Physical Exam Vitals and nursing note reviewed.  Constitutional:      General: He is not in acute distress.    Appearance: He is well-developed.  HENT:     Head: Atraumatic.     Nose: Nose normal.     Mouth/Throat:     Mouth: Mucous membranes are moist.  Eyes:     Conjunctiva/sclera: Conjunctivae normal.  Cardiovascular:     Rate and Rhythm: Normal rate and regular rhythm.     Pulses: Normal pulses.     Heart sounds: Normal heart sounds.  Pulmonary:     Effort: Pulmonary effort is normal.     Breath sounds: Normal breath sounds. No wheezing, rhonchi or rales.  Abdominal:     Palpations: Abdomen is soft.     Tenderness: There is no  abdominal tenderness.  Musculoskeletal:     Cervical back: Neck supple.     Comments: Bilateral BKA  Skin:    Findings: No rash.  Neurological:     Mental Status: He is alert.     ED Results / Procedures / Treatments   Labs (all labs ordered are listed, but only abnormal results are displayed) Labs Reviewed - No data to display  EKG None  Radiology DG Chest Portable 1 View  Result Date: 03/19/2022 CLINICAL DATA:  Cough. EXAM: PORTABLE CHEST 1 VIEW COMPARISON:  03/12/2022 and older exams. FINDINGS: Cardiac silhouette is normal in size and configuration. Normal mediastinal and hilar contours. Clear lungs.  No pleural effusion or pneumothorax. Skeletal structures are grossly intact. IMPRESSION: No active  disease. Electronically Signed   By: Amie Portland M.D.   On: 03/19/2022 17:57    Procedures Procedures    Medications Ordered in ED Medications - No data to display  ED Course/ Medical Decision Making/ A&P                           Medical Decision Making  BP (!) 130/91 (BP Location: Left Arm)   Pulse (!) 55   Temp 98.5 F (36.9 C) (Oral)   Resp 17   SpO2 97%   88:59 PM 61 year old male with significant history of homelessness, diabetes, bipolar, hypertension, paroxysmal atrial fibrillation, cirrhosis of the liver who was brought here via EMS with complaints of cough.  Patient report for the past week he has been experiencing sinus congestion, runny nose, cough, chest congestion.  Symptoms moderate in severity no associated fever or chills no nausea vomiting or diarrhea no abdominal pain no shortness of breath.  He mention being seen for his complaint a week ago but his symptoms still persist.  He denies any specific treatment tried.  On exam patient is well-appearing appears to be in no acute discomfort.  He is afebrile, vital signs stable no hypoxia.  Heart and lung sounds normal no wheezes rales or rhonchi.  Abdomen is soft nontender.  Patient has bilateral BKA that is normal-appearing.  He speaks in complete sentences.  Chest x-ray obtained independently viewed interpreted by me and I agree with radiologist interpretation.  Chest x-ray unremarkable.  I have reviewed patient's previous ER visits and have considered his EMR imaging my plan of care.  Patient has a normal chest x-ray a week ago as well as negative COVID test.  At this time no acute emergent medical condition identified.  I have low suspicion for pneumonia, PE, ACS, pneumothorax, COVID, or any other acute emergent medical condition.  Will provide patient with symptomatic treatment and encourage outpatient follow-up.  Patient otherwise stable for discharge.  I have considered patient's social determinant of health which  includes tobacco use in my plan of care.  Encouraged tobacco cessation.  Triage note mention that patient report he is tired of sleeping outside during the day at [facility].  He did not express any SI or HI.  Will provide resources.        Final Clinical Impression(s) / ED Diagnoses Final diagnoses:  Chest congestion    Rx / DC Orders ED Discharge Orders          Ordered    benzonatate (TESSALON) 100 MG capsule  2 times daily PRN        03/19/22 1815    guaiFENesin 200 MG tablet  Every 8 hours PRN  03/19/22 1815              Fayrene Helper, PA-C 03/19/22 1816    Glendora Score, MD 03/20/22 [phone]

## 2022-03-19 NOTE — ED Notes (Signed)
Received verbal report from Everardo All RN at this time

## 2022-03-21 ENCOUNTER — Other Ambulatory Visit (HOSPITAL_COMMUNITY): Payer: Self-pay

## 2022-03-23 ENCOUNTER — Encounter (HOSPITAL_COMMUNITY): Payer: Self-pay | Admitting: Emergency Medicine

## 2022-03-23 ENCOUNTER — Inpatient Hospital Stay (HOSPITAL_COMMUNITY)
Admission: EM | Admit: 2022-03-23 | Discharge: 2022-03-29 | DRG: 372 | Disposition: A | Discharge status: Home / Self Care | Payer: Medicare Other | Attending: Internal Medicine | Admitting: Internal Medicine

## 2022-03-23 DIAGNOSIS — R112 Nausea with vomiting, unspecified: Secondary | ICD-10-CM | POA: Diagnosis present

## 2022-03-23 DIAGNOSIS — Z8249 Family history of ischemic heart disease and other diseases of the circulatory system: Secondary | ICD-10-CM

## 2022-03-23 DIAGNOSIS — Z8614 Personal history of Methicillin resistant Staphylococcus aureus infection: Secondary | ICD-10-CM

## 2022-03-23 DIAGNOSIS — Z993 Dependence on wheelchair: Secondary | ICD-10-CM

## 2022-03-23 DIAGNOSIS — Z89512 Acquired absence of left leg below knee: Secondary | ICD-10-CM

## 2022-03-23 DIAGNOSIS — F3130 Bipolar disorder, current episode depressed, mild or moderate severity, unspecified: Secondary | ICD-10-CM | POA: Diagnosis present

## 2022-03-23 DIAGNOSIS — E46 Unspecified protein-calorie malnutrition: Secondary | ICD-10-CM | POA: Diagnosis present

## 2022-03-23 DIAGNOSIS — Z89511 Acquired absence of right leg below knee: Secondary | ICD-10-CM

## 2022-03-23 DIAGNOSIS — A419 Sepsis, unspecified organism: Secondary | ICD-10-CM | POA: Diagnosis present

## 2022-03-23 DIAGNOSIS — I10 Essential (primary) hypertension: Secondary | ICD-10-CM | POA: Diagnosis present

## 2022-03-23 DIAGNOSIS — Z79899 Other long term (current) drug therapy: Secondary | ICD-10-CM

## 2022-03-23 DIAGNOSIS — F10129 Alcohol abuse with intoxication, unspecified: Secondary | ICD-10-CM | POA: Diagnosis present

## 2022-03-23 DIAGNOSIS — R6510 Systemic inflammatory response syndrome (SIRS) of non-infectious origin without acute organ dysfunction: Principal | ICD-10-CM

## 2022-03-23 DIAGNOSIS — E119 Type 2 diabetes mellitus without complications: Secondary | ICD-10-CM

## 2022-03-23 DIAGNOSIS — I480 Paroxysmal atrial fibrillation: Secondary | ICD-10-CM | POA: Diagnosis present

## 2022-03-23 DIAGNOSIS — Z833 Family history of diabetes mellitus: Secondary | ICD-10-CM

## 2022-03-23 DIAGNOSIS — K766 Portal hypertension: Secondary | ICD-10-CM | POA: Diagnosis present

## 2022-03-23 DIAGNOSIS — Z6835 Body mass index (BMI) 35.0-35.9, adult: Secondary | ICD-10-CM

## 2022-03-23 DIAGNOSIS — K7460 Unspecified cirrhosis of liver: Secondary | ICD-10-CM | POA: Diagnosis present

## 2022-03-23 DIAGNOSIS — A040 Enteropathogenic Escherichia coli infection: Secondary | ICD-10-CM | POA: Diagnosis not present

## 2022-03-23 DIAGNOSIS — F419 Anxiety disorder, unspecified: Secondary | ICD-10-CM | POA: Diagnosis present

## 2022-03-23 DIAGNOSIS — Z91199 Patient's noncompliance with other medical treatment and regimen due to unspecified reason: Secondary | ICD-10-CM

## 2022-03-23 DIAGNOSIS — Y901 Blood alcohol level of 20-39 mg/100 ml: Secondary | ICD-10-CM | POA: Diagnosis present

## 2022-03-23 DIAGNOSIS — R45851 Suicidal ideations: Secondary | ICD-10-CM | POA: Diagnosis present

## 2022-03-23 DIAGNOSIS — R627 Adult failure to thrive: Secondary | ICD-10-CM | POA: Diagnosis present

## 2022-03-23 DIAGNOSIS — D696 Thrombocytopenia, unspecified: Secondary | ICD-10-CM | POA: Diagnosis present

## 2022-03-23 DIAGNOSIS — Z20822 Contact with and (suspected) exposure to covid-19: Secondary | ICD-10-CM | POA: Diagnosis present

## 2022-03-23 DIAGNOSIS — Z7984 Long term (current) use of oral hypoglycemic drugs: Secondary | ICD-10-CM

## 2022-03-23 DIAGNOSIS — D7589 Other specified diseases of blood and blood-forming organs: Secondary | ICD-10-CM | POA: Diagnosis present

## 2022-03-23 DIAGNOSIS — F17210 Nicotine dependence, cigarettes, uncomplicated: Secondary | ICD-10-CM | POA: Diagnosis present

## 2022-03-23 DIAGNOSIS — Z5900 Homelessness unspecified: Secondary | ICD-10-CM

## 2022-03-23 DIAGNOSIS — E872 Acidosis, unspecified: Secondary | ICD-10-CM | POA: Diagnosis present

## 2022-03-23 DIAGNOSIS — R9431 Abnormal electrocardiogram [ECG] [EKG]: Secondary | ICD-10-CM | POA: Diagnosis present

## 2022-03-23 DIAGNOSIS — E861 Hypovolemia: Secondary | ICD-10-CM | POA: Diagnosis present

## 2022-03-23 DIAGNOSIS — F319 Bipolar disorder, unspecified: Secondary | ICD-10-CM | POA: Diagnosis present

## 2022-03-23 DIAGNOSIS — R109 Unspecified abdominal pain: Secondary | ICD-10-CM | POA: Diagnosis present

## 2022-03-23 DIAGNOSIS — R197 Diarrhea, unspecified: Secondary | ICD-10-CM | POA: Diagnosis present
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
## 2022-03-23 NOTE — ED Notes (Signed)
Patient had episode of emesis 
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
## 2022-03-24 ENCOUNTER — Encounter (HOSPITAL_COMMUNITY): Payer: Self-pay | Admitting: Internal Medicine

## 2022-03-24 ENCOUNTER — Other Ambulatory Visit (HOSPITAL_COMMUNITY): Payer: Self-pay

## 2022-03-24 ENCOUNTER — Inpatient Hospital Stay (HOSPITAL_COMMUNITY): Payer: Medicare Other

## 2022-03-24 ENCOUNTER — Emergency Department (HOSPITAL_COMMUNITY): Payer: Medicare Other

## 2022-03-24 DIAGNOSIS — Z5900 Homelessness unspecified: Secondary | ICD-10-CM | POA: Diagnosis not present

## 2022-03-24 DIAGNOSIS — R6520 Severe sepsis without septic shock: Secondary | ICD-10-CM | POA: Diagnosis not present

## 2022-03-24 DIAGNOSIS — D7589 Other specified diseases of blood and blood-forming organs: Secondary | ICD-10-CM | POA: Diagnosis present

## 2022-03-24 DIAGNOSIS — R109 Unspecified abdominal pain: Secondary | ICD-10-CM | POA: Diagnosis not present

## 2022-03-24 DIAGNOSIS — R1013 Epigastric pain: Secondary | ICD-10-CM | POA: Diagnosis not present

## 2022-03-24 DIAGNOSIS — I480 Paroxysmal atrial fibrillation: Secondary | ICD-10-CM

## 2022-03-24 DIAGNOSIS — R9431 Abnormal electrocardiogram [ECG] [EKG]: Secondary | ICD-10-CM

## 2022-03-24 DIAGNOSIS — K7460 Unspecified cirrhosis of liver: Secondary | ICD-10-CM

## 2022-03-24 DIAGNOSIS — F419 Anxiety disorder, unspecified: Secondary | ICD-10-CM | POA: Diagnosis present

## 2022-03-24 DIAGNOSIS — Z7984 Long term (current) use of oral hypoglycemic drugs: Secondary | ICD-10-CM | POA: Diagnosis not present

## 2022-03-24 DIAGNOSIS — R197 Diarrhea, unspecified: Secondary | ICD-10-CM

## 2022-03-24 DIAGNOSIS — F17210 Nicotine dependence, cigarettes, uncomplicated: Secondary | ICD-10-CM | POA: Diagnosis present

## 2022-03-24 DIAGNOSIS — F319 Bipolar disorder, unspecified: Secondary | ICD-10-CM | POA: Diagnosis not present

## 2022-03-24 DIAGNOSIS — A419 Sepsis, unspecified organism: Secondary | ICD-10-CM

## 2022-03-24 DIAGNOSIS — Z89512 Acquired absence of left leg below knee: Secondary | ICD-10-CM | POA: Diagnosis not present

## 2022-03-24 DIAGNOSIS — Z6835 Body mass index (BMI) 35.0-35.9, adult: Secondary | ICD-10-CM | POA: Diagnosis not present

## 2022-03-24 DIAGNOSIS — E119 Type 2 diabetes mellitus without complications: Secondary | ICD-10-CM | POA: Diagnosis present

## 2022-03-24 DIAGNOSIS — Y901 Blood alcohol level of 20-39 mg/100 ml: Secondary | ICD-10-CM | POA: Diagnosis present

## 2022-03-24 DIAGNOSIS — E861 Hypovolemia: Secondary | ICD-10-CM | POA: Diagnosis present

## 2022-03-24 DIAGNOSIS — D696 Thrombocytopenia, unspecified: Secondary | ICD-10-CM | POA: Diagnosis present

## 2022-03-24 DIAGNOSIS — I10 Essential (primary) hypertension: Secondary | ICD-10-CM | POA: Diagnosis present

## 2022-03-24 DIAGNOSIS — E46 Unspecified protein-calorie malnutrition: Secondary | ICD-10-CM | POA: Diagnosis present

## 2022-03-24 DIAGNOSIS — R627 Adult failure to thrive: Secondary | ICD-10-CM | POA: Diagnosis present

## 2022-03-24 DIAGNOSIS — F10129 Alcohol abuse with intoxication, unspecified: Secondary | ICD-10-CM | POA: Diagnosis present

## 2022-03-24 DIAGNOSIS — K766 Portal hypertension: Secondary | ICD-10-CM | POA: Diagnosis present

## 2022-03-24 DIAGNOSIS — F3130 Bipolar disorder, current episode depressed, mild or moderate severity, unspecified: Secondary | ICD-10-CM | POA: Diagnosis present

## 2022-03-24 DIAGNOSIS — A040 Enteropathogenic Escherichia coli infection: Secondary | ICD-10-CM | POA: Diagnosis present

## 2022-03-24 DIAGNOSIS — E872 Acidosis, unspecified: Secondary | ICD-10-CM

## 2022-03-24 DIAGNOSIS — R112 Nausea with vomiting, unspecified: Secondary | ICD-10-CM

## 2022-03-24 DIAGNOSIS — Z993 Dependence on wheelchair: Secondary | ICD-10-CM | POA: Diagnosis not present

## 2022-03-24 DIAGNOSIS — R6510 Systemic inflammatory response syndrome (SIRS) of non-infectious origin without acute organ dysfunction: Secondary | ICD-10-CM | POA: Diagnosis present

## 2022-03-24 DIAGNOSIS — R45851 Suicidal ideations: Secondary | ICD-10-CM | POA: Diagnosis present

## 2022-03-24 DIAGNOSIS — Z20822 Contact with and (suspected) exposure to covid-19: Secondary | ICD-10-CM | POA: Diagnosis present

## 2022-03-24 HISTORY — DX: Sepsis, unspecified organism: A41.9

## 2022-03-24 LAB — HEPATIC FUNCTION PANEL
ALT: 16 U/L (ref 0–44)
AST: 29 U/L (ref 15–41)
Albumin: 3.1 g/dL — ABNORMAL LOW (ref 3.5–5.0)
Alkaline Phosphatase: 75 U/L (ref 38–126)
Bilirubin, Direct: 0.6 mg/dL — ABNORMAL HIGH (ref 0.0–0.2)
Indirect Bilirubin: 1.7 mg/dL — ABNORMAL HIGH (ref 0.3–0.9)
Total Bilirubin: 2.3 mg/dL — ABNORMAL HIGH (ref 0.3–1.2)
Total Protein: 5.8 g/dL — ABNORMAL LOW (ref 6.5–8.1)

## 2022-03-24 LAB — URINALYSIS, ROUTINE W REFLEX MICROSCOPIC
Bilirubin Urine: NEGATIVE
Glucose, UA: NEGATIVE mg/dL
Hgb urine dipstick: NEGATIVE
Ketones, ur: 5 mg/dL — AB
Leukocytes,Ua: NEGATIVE
Nitrite: NEGATIVE
Protein, ur: NEGATIVE mg/dL
Specific Gravity, Urine: 1.025 (ref 1.005–1.030)
pH: 5 (ref 5.0–8.0)

## 2022-03-24 LAB — LIPASE, BLOOD: Lipase: 29 U/L (ref 11–51)

## 2022-03-24 LAB — CBC
HCT: 40.9 % (ref 39.0–52.0)
Hemoglobin: 13.2 g/dL (ref 13.0–17.0)
MCH: 33.7 pg (ref 26.0–34.0)
MCHC: 32.3 g/dL (ref 30.0–36.0)
MCV: 104.3 fL — ABNORMAL HIGH (ref 80.0–100.0)
Platelets: 102 10*3/uL — ABNORMAL LOW (ref 150–400)
RBC: 3.92 MIL/uL — ABNORMAL LOW (ref 4.22–5.81)
RDW: 13.9 % (ref 11.5–15.5)
WBC: 22.5 10*3/uL — ABNORMAL HIGH (ref 4.0–10.5)
nRBC: 0 % (ref 0.0–0.2)

## 2022-03-24 LAB — BASIC METABOLIC PANEL
Anion gap: 17 — ABNORMAL HIGH (ref 5–15)
BUN: 9 mg/dL (ref 8–23)
CO2: 10 mmol/L — ABNORMAL LOW (ref 22–32)
Calcium: 8.3 mg/dL — ABNORMAL LOW (ref 8.9–10.3)
Chloride: 111 mmol/L (ref 98–111)
Creatinine, Ser: 0.82 mg/dL (ref 0.61–1.24)
GFR, Estimated: >60 mL/min (ref >60)
Glucose, Bld: 171 mg/dL — ABNORMAL HIGH (ref 70–99)
Potassium: 3.8 mmol/L (ref 3.5–5.1)
Sodium: 138 mmol/L (ref 135–145)

## 2022-03-24 LAB — ETHANOL: Alcohol, Ethyl (B): 36 mg/dL — ABNORMAL HIGH (ref <10)

## 2022-03-24 LAB — LACTIC ACID, PLASMA
Lactic Acid, Venous: 5.2 mmol/L (ref 0.5–1.9)
Lactic Acid, Venous: 4.4 mmol/L (ref 0.5–1.9)
Lactic Acid, Venous: 5 mmol/L (ref 0.5–1.9)
Lactic Acid, Venous: 3.3 mmol/L (ref 0.5–1.9)
Lactic Acid, Venous: 1.7 mmol/L (ref 0.5–1.9)

## 2022-03-24 LAB — RAPID URINE DRUG SCREEN, HOSP PERFORMED
Amphetamines: NOT DETECTED
Barbiturates: NOT DETECTED
Benzodiazepines: NOT DETECTED
Cocaine: NOT DETECTED
Opiates: NOT DETECTED
Tetrahydrocannabinol: NOT DETECTED

## 2022-03-24 LAB — SARS CORONAVIRUS 2 BY RT PCR: SARS Coronavirus 2 by RT PCR: NEGATIVE

## 2022-03-24 LAB — PHOSPHORUS: Phosphorus: 1.6 mg/dL — ABNORMAL LOW (ref 2.5–4.6)

## 2022-03-24 LAB — MAGNESIUM: Magnesium: 1 mg/dL — ABNORMAL LOW (ref 1.7–2.4)

## 2022-03-24 LAB — SEDIMENTATION RATE: Sed Rate: 10 mm/hr (ref 0–16)

## 2022-03-24 LAB — C-REACTIVE PROTEIN: CRP: 6.5 mg/dL — ABNORMAL HIGH (ref <1.0)

## 2022-03-24 LAB — PROCALCITONIN: Procalcitonin: 2.13 ng/mL

## 2022-03-24 LAB — AMMONIA: Ammonia: 56 umol/L — ABNORMAL HIGH (ref 9–35)

## 2022-03-24 LAB — VITAMIN B12: Vitamin B-12: 361 pg/mL (ref 180–914)

## 2022-03-24 LAB — CBG MONITORING, ED: Glucose-Capillary: 121 mg/dL — ABNORMAL HIGH (ref 70–99)

## 2022-03-24 LAB — TSH: TSH: 0.628 u[IU]/mL (ref 0.350–4.500)

## 2022-03-24 LAB — GLUCOSE, CAPILLARY: Glucose-Capillary: 179 mg/dL — ABNORMAL HIGH (ref 70–99)

## 2022-03-24 MED ORDER — VANCOMYCIN HCL 2000 MG/400ML IV SOLN
2000.0000 mg | Freq: Once | INTRAVENOUS | Status: AC
  Administered 2022-03-24: 2000 mg via INTRAVENOUS
  Filled 2022-03-24: qty 400

## 2022-03-24 MED ORDER — ALBUTEROL SULFATE (2.5 MG/3ML) 0.083% IN NEBU: 2.5000 mg | INHALATION_SOLUTION | Freq: Four times a day (QID) | RESPIRATORY_TRACT | Status: DC | PRN

## 2022-03-24 MED ORDER — LACTATED RINGERS IV BOLUS
1000.0000 mL | Freq: Once | INTRAVENOUS | Status: AC
  Administered 2022-03-24: 1000 mL via INTRAVENOUS

## 2022-03-24 MED ORDER — ADULT MULTIVITAMIN W/MINERALS CH
1.0000 | ORAL_TABLET | Freq: Every day | ORAL | Status: DC
  Administered 2022-03-24 – 2022-03-29 (×5): 1 via ORAL
  Filled 2022-03-24 (×5): qty 1

## 2022-03-24 MED ORDER — TRIMETHOBENZAMIDE HCL 100 MG/ML IM SOLN
200.0000 mg | Freq: Four times a day (QID) | INTRAMUSCULAR | Status: DC | PRN
Start: 2022-03-24 — End: 2022-03-29

## 2022-03-24 MED ORDER — DEXTROSE IN LACTATED RINGERS 5 % IV SOLN: INTRAVENOUS | Status: DC

## 2022-03-24 MED ORDER — INSULIN REGULAR(HUMAN) IN NACL 100-0.9 UT/100ML-% IV SOLN: INTRAVENOUS | Status: DC

## 2022-03-24 MED ORDER — LACTATED RINGERS IV BOLUS (SEPSIS)
1000.0000 mL | Freq: Once | INTRAVENOUS | Status: AC
  Administered 2022-03-24: 1000 mL via INTRAVENOUS

## 2022-03-24 MED ORDER — METRONIDAZOLE 500 MG/100ML IV SOLN
500.0000 mg | Freq: Once | INTRAVENOUS | Status: AC
  Administered 2022-03-24: 500 mg via INTRAVENOUS
  Filled 2022-03-24: qty 100

## 2022-03-24 MED ORDER — ONDANSETRON HCL 4 MG/2ML IJ SOLN
INTRAMUSCULAR | Status: AC
  Filled 2022-03-24: qty 2

## 2022-03-24 MED ORDER — ACETAMINOPHEN 650 MG RE SUPP: 650.0000 mg | Freq: Four times a day (QID) | RECTAL | Status: DC | PRN

## 2022-03-24 MED ORDER — LACTATED RINGERS IV BOLUS (SEPSIS)
500.0000 mL | Freq: Once | INTRAVENOUS | Status: AC
Start: 2022-03-24 — End: 2022-03-24
  Administered 2022-03-24: 500 mL via INTRAVENOUS

## 2022-03-24 MED ORDER — NICOTINE 7 MG/24HR TD PT24
7.0000 mg | MEDICATED_PATCH | Freq: Every day | TRANSDERMAL | Status: DC
  Administered 2022-03-24 – 2022-03-28 (×5): 7 mg via TRANSDERMAL
  Filled 2022-03-24 (×6): qty 1

## 2022-03-24 MED ORDER — MORPHINE SULFATE (PF) 2 MG/ML IV SOLN: 2.0000 mg | INTRAVENOUS | Status: DC | PRN

## 2022-03-24 MED ORDER — LACTATED RINGERS IV BOLUS (SEPSIS)
1000.0000 mL | Freq: Once | INTRAVENOUS | Status: AC
Start: 2022-03-24 — End: 2022-03-24
  Administered 2022-03-24: 1000 mL via INTRAVENOUS

## 2022-03-24 MED ORDER — ENOXAPARIN SODIUM 40 MG/0.4ML IJ SOSY
40.0000 mg | PREFILLED_SYRINGE | INTRAMUSCULAR | Status: DC
Start: 2022-03-24 — End: 2022-03-29
  Administered 2022-03-24 – 2022-03-28 (×5): 40 mg via SUBCUTANEOUS
  Filled 2022-03-24 (×6): qty 0.4

## 2022-03-24 MED ORDER — THIAMINE HCL 100 MG/ML IJ SOLN
100.0000 mg | Freq: Every day | INTRAMUSCULAR | Status: DC
  Administered 2022-03-25: 100 mg via INTRAVENOUS
  Filled 2022-03-24 (×2): qty 2

## 2022-03-24 MED ORDER — DEXTROSE 50 % IV SOLN: 0.0000 mL | INTRAVENOUS | Status: DC | PRN

## 2022-03-24 MED ORDER — MAGNESIUM SULFATE 4 GM/100ML IV SOLN
4.0000 g | Freq: Once | INTRAVENOUS | Status: AC
Start: 2022-03-24 — End: 2022-03-24
  Administered 2022-03-24: 4 g via INTRAVENOUS
  Filled 2022-03-24 (×2): qty 100

## 2022-03-24 MED ORDER — ONDANSETRON HCL 4 MG/2ML IJ SOLN
4.0000 mg | Freq: Once | INTRAMUSCULAR | Status: AC
  Administered 2022-03-24: 4 mg via INTRAVENOUS

## 2022-03-24 MED ORDER — THIAMINE HCL 100 MG PO TABS
100.0000 mg | ORAL_TABLET | Freq: Every day | ORAL | Status: DC
  Administered 2022-03-24 – 2022-03-29 (×5): 100 mg via ORAL
  Filled 2022-03-24 (×5): qty 1

## 2022-03-24 MED ORDER — VANCOMYCIN HCL IN DEXTROSE 1-5 GM/200ML-% IV SOLN
1000.0000 mg | Freq: Two times a day (BID) | INTRAVENOUS | Status: DC
  Administered 2022-03-24 – 2022-03-25 (×2): 1000 mg via INTRAVENOUS
  Filled 2022-03-24 (×3): qty 200

## 2022-03-24 MED ORDER — SODIUM CHLORIDE 0.9 % IV SOLN
2.0000 g | Freq: Three times a day (TID) | INTRAVENOUS | Status: DC
  Administered 2022-03-24 – 2022-03-27 (×9): 2 g via INTRAVENOUS
  Filled 2022-03-24 (×11): qty 12.5

## 2022-03-24 MED ORDER — METRONIDAZOLE 500 MG/100ML IV SOLN
500.0000 mg | Freq: Two times a day (BID) | INTRAVENOUS | Status: DC
  Administered 2022-03-24 – 2022-03-27 (×6): 500 mg via INTRAVENOUS
  Filled 2022-03-24 (×6): qty 100

## 2022-03-24 MED ORDER — INSULIN ASPART 100 UNIT/ML IJ SOLN
0.0000 [IU] | Freq: Three times a day (TID) | INTRAMUSCULAR | Status: DC
  Administered 2022-03-24 – 2022-03-25 (×2): 1 [IU] via SUBCUTANEOUS
  Administered 2022-03-25: 3 [IU] via SUBCUTANEOUS
  Administered 2022-03-25 – 2022-03-28 (×5): 1 [IU] via SUBCUTANEOUS
  Administered 2022-03-28: 2 [IU] via SUBCUTANEOUS

## 2022-03-24 MED ORDER — ONDANSETRON HCL 4 MG/2ML IJ SOLN
4.0000 mg | Freq: Four times a day (QID) | INTRAMUSCULAR | Status: DC | PRN
  Administered 2022-03-24: 4 mg via INTRAVENOUS

## 2022-03-24 MED ORDER — SODIUM CHLORIDE 0.9 % IV SOLN
2.0000 g | Freq: Once | INTRAVENOUS | Status: AC
  Administered 2022-03-24: 2 g via INTRAVENOUS
  Filled 2022-03-24: qty 12.5

## 2022-03-24 MED ORDER — LACTATED RINGERS IV BOLUS: 1000.0000 mL | Freq: Once | INTRAVENOUS | Status: DC

## 2022-03-24 MED ORDER — FOLIC ACID 1 MG PO TABS
1.0000 mg | ORAL_TABLET | Freq: Every day | ORAL | Status: DC
  Administered 2022-03-24 – 2022-03-29 (×5): 1 mg via ORAL
  Filled 2022-03-24 (×5): qty 1

## 2022-03-24 MED ORDER — POTASSIUM PHOSPHATES 15 MMOLE/5ML IV SOLN
30.0000 mmol | Freq: Once | INTRAVENOUS | Status: AC
  Administered 2022-03-24: 30 mmol via INTRAVENOUS
  Filled 2022-03-24: qty 10

## 2022-03-24 MED ORDER — ACETAMINOPHEN 325 MG PO TABS
650.0000 mg | ORAL_TABLET | Freq: Four times a day (QID) | ORAL | Status: DC | PRN
  Administered 2022-03-26: 650 mg via ORAL
  Filled 2022-03-24: qty 2

## 2022-03-24 MED ORDER — IOHEXOL 350 MG/ML SOLN
100.0000 mL | Freq: Once | INTRAVENOUS | Status: AC | PRN
  Administered 2022-03-24: 100 mL via INTRAVENOUS

## 2022-03-24 MED ORDER — LORAZEPAM 2 MG/ML IJ SOLN
1.0000 mg | INTRAMUSCULAR | Status: AC | PRN
  Administered 2022-03-24: 2 mg via INTRAVENOUS
  Filled 2022-03-24: qty 1

## 2022-03-24 MED ORDER — LORAZEPAM 1 MG PO TABS
1.0000 mg | ORAL_TABLET | ORAL | Status: AC | PRN
  Administered 2022-03-25: 2 mg via ORAL
  Filled 2022-03-24: qty 2

## 2022-03-24 MED ORDER — VANCOMYCIN HCL IN DEXTROSE 1-5 GM/200ML-% IV SOLN: 1000.0000 mg | Freq: Once | INTRAVENOUS | Status: DC

## 2022-03-24 MED ORDER — SODIUM CHLORIDE 0.9% FLUSH
3.0000 mL | Freq: Two times a day (BID) | INTRAVENOUS | Status: DC
  Administered 2022-03-24 – 2022-03-28 (×8): 3 mL via INTRAVENOUS

## 2022-03-24 MED ORDER — LACTATED RINGERS IV SOLN: INTRAVENOUS | Status: DC

## 2022-03-24 MED ORDER — LACTATED RINGERS IV SOLN: INTRAVENOUS | Status: AC

## 2022-03-24 NOTE — Plan of Care (Signed)
  Problem: Coping: Goal: Ability to adjust to condition or change in health will improve Outcome: Progressing   Problem: Education: Goal: Ability to describe self-care measures that may prevent or decrease complications (Diabetes Survival Skills Education) will improve Outcome: Progressing   Problem: Skin Integrity: Goal: Risk for impaired skin integrity will decrease Outcome: Progressing   Problem: Education: Goal: Knowledge of General Education information will improve Description: Including pain rating scale, medication(s)/side effects and non-pharmacologic comfort measures Outcome: Progressing

## 2022-03-24 NOTE — Progress Notes (Signed)
CT of the abdomen pelvis noted thickening of the digital rectosigmoid colon without discrete mass lesion concerning for nonspecific colitis and inflammatory changes surrounding the gallbladder concerning for acute cholecystitis.  Orders placed for right upper quadrant ultrasound.  Consulted general surgery for concern for acute cholecystitis.

## 2022-03-24 NOTE — Consult Note (Addendum)
Cory Palmer 1961-04-27  [id number].    Requesting MD: Dr. Fuller Plan Chief Complaint/Reason for Consult: cholecystitis  HPI:  This is a 61 yo male with a history of bipolar disorder, anxiety, DM, HTN, liver cirrhosis, h/o osteomyelitis of L foot with transmet amputation and subsequent B BKAs, and PAF, and cocaine use who does not appear to be on anticoagulation who presented to the ED with a couple of weeks of vague abdominal pain with diarrhea starting today.  He is having some pain just above his umbilicus with minimal RUQ tenderness.  He admits to both nausea and vomiting as well.  His pain does seem to get worse with oral intake.  Unable to check his temperature due to homeless state.  Upon arrival to the ED he was found to be tachycardic, intermittently tachycardic and tachypneic. TMAX 99.6. WBC 22.5. Lactic acid 5.2 >> 3.3. Total bilirubin 2.3 (chronically elevated) otherwise LFTs WNL. CT scan with wall thickening in the distal rectosigmoid colon without a discrete mass lesion and may represent a nonspecific colitis; inflammatory changes surround the gallbladder concerning for acute cholecystitis; stable changes of cirrhosis and portal hypertension. General surgery asked to see.  No prior h/o abdominal surgery Never had a colonoscopy Anticoagulants: none Smokes a few cigarettes daily Drinks 3-4 beers a month. 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/2018     Past Surgical History:  Procedure Laterality Date   AMPUTATION Left 09/28/2018   Procedure: LEFT TRANSMETATARSAL AMPUTATION;  Surgeon: Newt Minion, MD;  Location: [place];  Service: Orthopedics;  Laterality: Left;   AMPUTATION Left 10/23/2019   Procedure: AMPUTATION BELOW KNEE;  Surgeon: Newt Minion, MD;  Location: [place];  Service: Orthopedics;  Laterality: Left;   BELOW KNEE LEG AMPUTATION Right    ESOPHAGOGASTRODUODENOSCOPY (EGD) WITH PROPOFOL N/A 10/19/2020   Procedure: ESOPHAGOGASTRODUODENOSCOPY (EGD) WITH PROPOFOL;  Surgeon: Juanita Craver, MD;  [facility] ENDOSCOPY;  Service: Endoscopy;  Laterality: N/A;   TEE WITHOUT CARDIOVERSION N/A 10/25/2019   Procedure: TRANSESOPHAGEAL ECHOCARDIOGRAM (TEE);  Surgeon: Lelon Perla, MD;  Location: [facility] ENDOSCOPY;  Service: Cardiovascular;  Laterality: N/A;    Social History:  reports that he has been smoking cigarettes. He has been smoking an average of .25 packs per day. He has never used smokeless tobacco. He reports that he does not currently use alcohol. He reports that he does not currently use drugs after having used the following drugs: Cocaine.  Allergies: No Known Allergies  (Not in a hospital admission)    Physical Exam: Blood pressure 112/71, pulse 82, temperature 98.5 F (36.9 C), temperature source Oral, resp. rate (!) 23, SpO2 94 %. General: pleasant male who is laying in bed in NAD Heart: A fib,  Lungs: CTAB, no wheezes, rhonchi, or rales noted.  Respiratory effort nonlabored Abd: obese, soft, ND, +BS, no masses, hernias, or organomegaly. Mild supraumbilical and  RUQ TTP without rebound or guarding MS: s/p bilateral BKA Skin: warm and dry with no masses, lesions, or rashes Neuro: MAEs, no gross motor or sensory deficits BUE/BLE  Results for orders placed or performed during the hospital encounter of 03/23/22 (from the past 48 hour(s))  CBC     Status: Abnormal   Collection Time: 03/24/22 12:15 AM  Result Value Ref  Range   WBC 22.5 (H) 4.0 - 10.5 K/uL   RBC 3.92 (L) 4.22 - 5.81 MIL/uL   Hemoglobin 13.2 13.0 - 17.0 g/dL   HCT 16.1 09.6 - 04.5 %   MCV 104.3 (H) 80.0 - 100.0 fL   MCH 33.7 26.0 - 34.0 pg   MCHC 32.3 30.0 - 36.0 g/dL   RDW 40.9 81.1 - 91.4 %   Platelets 102 (L) 150 - 400 K/uL   nRBC 0.0 0.0 - 0.2 %    Comment: Performed at [facility], [address], [place], [place] [address]  Basic metabolic panel     Status: Abnormal   Collection Time: 03/24/22 12:15 AM  Result Value Ref Range   Sodium 138 135 - 145 mmol/L   Potassium 3.8 3.5 - 5.1 mmol/L    Comment: HEMOLYSIS AT THIS LEVEL MAY AFFECT RESULT   Chloride 111 98 - 111 mmol/L   CO2 10 (L) 22 - 32 mmol/L   Glucose, Bld 171 (H) 70 - 99 mg/dL    Comment: Glucose reference range applies only to samples taken after fasting for at least 8 hours.   BUN 9 8 - 23 mg/dL   Creatinine, Ser 6.21 0.61 - 1.24 mg/dL   Calcium 8.3 (L) 8.9 - 10.3 mg/dL   GFR, Estimated >30 >86 mL/min    Comment: (NOTE) Calculated using the CKD-EPI Creatinine Equation (2021)    Anion gap 17 (H) 5 - 15    Comment: Performed at [facility] Lab, [address], [place], [place] [address]  Lactic acid, plasma     Status: Abnormal   Collection Time: 03/24/22  2:33 AM  Result Value Ref Range   Lactic Acid, Venous 5.2 (HH) 0.5 - 1.9 mmol/L    Comment: CRITICAL RESULT CALLED TO, READ BACK BY AND VERIFIED WITH Laurence Aly, RN, [phone] 03/24/22, Mliss Sax Performed at [facility], [address], [place], [place] [address]   SARS Coronavirus 2 by RT PCR (hospital order, performed in [facility] hospital lab) *cepheid single result test* Anterior Nasal Swab     Status: None   Collection Time: 03/24/22  3:59 AM   Specimen: Anterior Nasal Swab  Result Value Ref Range   SARS Coronavirus 2 by RT PCR NEGATIVE NEGATIVE    Comment: (NOTE) SARS-CoV-2 target nucleic acids are NOT DETECTED.  The SARS-CoV-2 RNA is generally detectable in upper and lower respiratory  specimens during the acute phase of infection. The lowest concentration of SARS-CoV-2 viral copies this assay can detect is 250 copies / mL. A negative result does not preclude SARS-CoV-2 infection and should not be used as the sole basis for treatment or other patient management decisions.  A negative result may occur with improper specimen collection / handling, submission of specimen other than nasopharyngeal swab, presence of viral mutation(s) within the areas targeted by this assay, and inadequate number of viral copies (<250 copies / mL). A negative result must be combined with clinical observations, patient history, and epidemiological information.  Fact Sheet for Patients:   RoadLapTop.co.za  Fact Sheet for  Healthcare Providers: https://hall.com/  This test is not yet approved or  cleared by the [place] and has been authorized for detection and/or diagnosis of SARS-CoV-2 by FDA under an Emergency Use Authorization (EUA).  This EUA will remain in effect (meaning this test can be used) for the duration of the COVID-19 declaration under Section 564(b)(1) of the Act, 21 U.S.C. section 360bbb-3(b)(1), unless the authorization is terminated or revoked sooner.  Performed at [facility], [place] [address], [place], [place] [address]   Ethanol     Status: Abnormal   Collection Time: 03/24/22  4:00 AM  Result Value Ref Range   Alcohol, Ethyl (B) 36 (H) <10 mg/dL    Comment: (NOTE) Lowest detectable limit for serum alcohol is 10 mg/dL.  For medical purposes only. Performed at [facility], [place] [address], [place], [place] [address]   Hepatic function panel     Status: Abnormal   Collection Time: 03/24/22  4:00 AM  Result Value Ref Range   Total Protein 5.8 (L) 6.5 - 8.1 g/dL   Albumin 3.1 (L) 3.5 - 5.0 g/dL   AST 29 15 - 41 U/L   ALT 16 0 - 44 U/L   Alkaline Phosphatase 75 38 - 126 U/L   Total Bilirubin  2.3 (H) 0.3 - 1.2 mg/dL   Bilirubin, Direct 0.6 (H) 0.0 - 0.2 mg/dL   Indirect Bilirubin 1.7 (H) 0.3 - 0.9 mg/dL    Comment: Performed at [facility] [address], [place], [place] [address]  Lactic acid, plasma     Status: Abnormal   Collection Time: 03/24/22  4:00 AM  Result Value Ref Range   Lactic Acid, Venous 4.4 (HH) 0.5 - 1.9 mmol/L    Comment: CRITICAL VALUE NOTED.  VALUE IS CONSISTENT WITH PREVIOUSLY REPORTED AND CALLED VALUE. Performed at [facility], [place] [address] [place], [place] [address]   Urinalysis, Routine w reflex microscopic Urine, Clean Catch     Status: Abnormal   Collection Time: 03/24/22  7:24 AM  Result Value Ref Range   Color, Urine AMBER (A) YELLOW    Comment: BIOCHEMICALS MAY BE AFFECTED BY COLOR   APPearance CLEAR CLEAR   Specific Gravity, Urine 1.025 1.005 - 1.030   pH 5.0 5.0 - 8.0   Glucose, UA NEGATIVE NEGATIVE mg/dL   Hgb urine dipstick NEGATIVE NEGATIVE   Bilirubin Urine NEGATIVE NEGATIVE   Ketones, ur 5 (A) NEGATIVE mg/dL   Protein, ur NEGATIVE NEGATIVE mg/dL   Nitrite NEGATIVE NEGATIVE   Leukocytes,Ua NEGATIVE NEGATIVE    Comment: Performed at [facility] [address] [place], [place] [address]  Rapid urine drug screen (hospital performed)     Status: None   Collection Time: 03/24/22  7:24 AM  Result Value Ref Range   Opiates NONE DETECTED NONE DETECTED   Cocaine NONE DETECTED NONE DETECTED   Benzodiazepines NONE DETECTED NONE DETECTED   Amphetamines NONE DETECTED NONE DETECTED   Tetrahydrocannabinol NONE DETECTED NONE DETECTED   Barbiturates NONE DETECTED NONE DETECTED    Comment: (NOTE) DRUG SCREEN FOR MEDICAL PURPOSES ONLY.  IF CONFIRMATION IS NEEDED FOR ANY PURPOSE, NOTIFY LAB WITHIN 5 DAYS.  LOWEST DETECTABLE LIMITS FOR URINE DRUG SCREEN Drug Class                     Cutoff (ng/mL) Amphetamine and metabolites    1000 Barbiturate and metabolites    200 Benzodiazepine  200 Tricyclics  and metabolites     300 Opiates and metabolites        300 Cocaine and metabolites        300 THC                            50 Performed at [facility], [address] [place], [place] [address]   Lactic acid, plasma     Status: Abnormal   Collection Time: 03/24/22  7:29 AM  Result Value Ref Range   Lactic Acid, Venous 5.0 (HH) 0.5 - 1.9 mmol/L    Comment: CRITICAL VALUE NOTED. VALUE IS CONSISTENT WITH PREVIOUSLY REPORTED/CALLED VALUE Performed at [facility], [address] [place], [place] [address]   Sedimentation rate     Status: None   Collection Time: 03/24/22  9:42 AM  Result Value Ref Range   Sed Rate 10 0 - 16 mm/hr    Comment: Performed at [facility], [address], [place], [place] [address]  C-reactive protein     Status: Abnormal   Collection Time: 03/24/22  9:42 AM  Result Value Ref Range   CRP 6.5 (H) <1.0 mg/dL    Comment: Performed at [facility], [address], [place], [place] [address]  Procalcitonin - Baseline     Status: None   Collection Time: 03/24/22  9:42 AM  Result Value Ref Range   Procalcitonin 2.13 ng/mL    Comment:        Interpretation: PCT > 2 ng/mL: Systemic infection (sepsis) is likely, unless other causes are known. (NOTE)       Sepsis PCT Algorithm           Lower Respiratory Tract                                      Infection PCT Algorithm    ----------------------------     ----------------------------         PCT < 0.25 ng/mL                PCT < 0.10 ng/mL          Strongly encourage             Strongly discourage   discontinuation of antibiotics    initiation of antibiotics    ----------------------------     -----------------------------       PCT 0.25 - 0.50 ng/mL            PCT 0.10 - 0.25 ng/mL               OR       >80% decrease in PCT            Discourage initiation of                                            antibiotics      Encourage discontinuation           of antibiotics     ----------------------------     -----------------------------         PCT >= 0.50 ng/mL              PCT 0.26 -  0.50 ng/mL               AND       <80% decrease in PCT              Encourage initiation of                                             antibiotics       Encourage continuation           of antibiotics    ----------------------------     -----------------------------        PCT >= 0.50 ng/mL                  PCT > 0.50 ng/mL               AND         increase in PCT                  Strongly encourage                                      initiation of antibiotics    Strongly encourage escalation           of antibiotics                                     -----------------------------                                           PCT <= 0.25 ng/mL                                                 OR                                        > 80% decrease in PCT                                      Discontinue / Do not initiate                                             antibiotics  Performed at [facility], [address], [place], [place] [address]   Vitamin B12     Status: None   Collection Time: 03/24/22  9:42 AM  Result Value Ref Range   Vitamin B-12 361 180 - 914 pg/mL    Comment: (NOTE) This assay is not validated for testing neonatal or myeloproliferative syndrome specimens for Vitamin B12 levels. Performed at [facility], [address] [place], [place] [address]   Magnesium  Status: Abnormal   Collection Time: 03/24/22  9:42 AM  Result Value Ref Range   Magnesium 1.0 (L) 1.7 - 2.4 mg/dL    Comment: Performed at [facility] [address], [place], [place] [address]  Phosphorus     Status: Abnormal   Collection Time: 03/24/22  9:42 AM  Result Value Ref Range   Phosphorus 1.6 (L) 2.5 - 4.6 mg/dL    Comment: Performed at [facility] [address], [place], [place] [address]  TSH     Status: None   Collection Time: 03/24/22   9:42 AM  Result Value Ref Range   TSH 0.628 0.350 - 4.500 uIU/mL    Comment: Performed by a 3rd Generation assay with a functional sensitivity of <=0.01 uIU/mL. Performed at [facility], [place] [address] [place], [place] [address]   Lactic acid, plasma     Status: Abnormal   Collection Time: 03/24/22 11:24 AM  Result Value Ref Range   Lactic Acid, Venous 3.3 (HH) 0.5 - 1.9 mmol/L    Comment: CRITICAL VALUE NOTED. VALUE IS CONSISTENT WITH PREVIOUSLY REPORTED/CALLED VALUE Performed at [facility], [place] [address] [place], [place] [address]   Lipase, blood     Status: None   Collection Time: 03/24/22  1:30 PM  Result Value Ref Range   Lipase 29 11 - 51 U/L    Comment: Performed at [facility] [address] [place],  [address]   CT ABDOMEN PELVIS W CONTRAST  Result Date: 03/24/2022 CLINICAL DATA:  Abdominal pain, acute, nonlocalized EXAM: CT ABDOMEN AND PELVIS WITH CONTRAST TECHNIQUE: Multidetector CT imaging of the abdomen and pelvis was performed using the standard protocol following bolus administration of intravenous contrast. RADIATION DOSE REDUCTION: This exam was performed according to the departmental dose-optimization program which includes automated exposure control, adjustment of the mA and/or kV according to patient size and/or use of iterative reconstruction technique. CONTRAST:  168mL OMNIPAQUE IOHEXOL 350 MG/ML SOLN COMPARISON:  CT of the abdomen and pelvis 09/13/2021 FINDINGS: Lower chest: Heart size is normal. Coronary artery calcifications are present. Mild dependent atelectasis is worse right than left. No significant pleural or pericardial effusion is present. Hepatobiliary: Shrunken irregular liver is again noted with diffuse fatty infiltration. Edematous changes surround the gallbladder with enhancement of mucosa. The common bile duct is within normal limits. Pancreas: Unremarkable. No pancreatic ductal dilatation or surrounding inflammatory  changes. Spleen: Splenomegaly is stable. Spleen measures 20 cm cephalo caudad. Adrenals/Urinary Tract: The adrenal glands are within normal limits bilaterally. A simple cyst in the medial right kidney measures 2.7 cm. Recommend no follow-up imaging. No other significant renal lesions are present. No stone or mass lesion is present. No obstruction is present. Ureters are within normal limits bilaterally. The urinary bladder is unremarkable. Stomach/Bowel: Stomach and duodenum are within normal limits. Small bowel is unremarkable. There is some stranding in the small bowel mesentery. Terminal ileum is normal. The appendix is visualized and within normal limits. The ascending and transverse colon are within normal limits. Descending proximal sigmoid colon is within normal limits. There is some wall thickening in the distal rectosigmoid colon without a discrete mass. Vascular/Lymphatic: Atherosclerotic changes in the aorta branch vessels are stable. Subcentimeter periaortic nodes are stable. 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 Collaterals noted into the pelvis. Reproductive: Prostate is unremarkable. Other: No abdominal wall hernia or abnormality. No abdominopelvic ascites. Musculoskeletal: Grade 1 anterolisthesis and degenerative endplate changes are noted at L4-5.  Inferior endplate Schmorl's node T9 is stable. No new lesions are present. Bony pelvis is within normal limits. The hips are located and normal. IMPRESSION: 1. Wall thickening in the distal rectosigmoid colon without a discrete mass lesion. This may represent a nonspecific colitis. 2. Inflammatory changes surround the gallbladder concerning for acute cholecystitis. Right upper quadrant ultrasound may be useful for further evaluation. 3. Stable changes of cirrhosis and portal hypertension. 4. Coronary artery disease. 5. Aortic Atherosclerosis (ICD10-I70.0). Electronically Signed   By: Marin Roberts M.D.   On: 03/24/2022 14:23   DG Chest 1  View  Result Date: 03/24/2022 CLINICAL DATA:  Leukocytosis. EXAM: CHEST  1 VIEW COMPARISON:  Chest radiograph dated 03/19/2022. FINDINGS: The heart size and mediastinal contours are within normal limits. Both lungs are clear. The visualized skeletal structures are unremarkable. IMPRESSION: No active disease. Electronically Signed   By: Elgie Collard M.D.   On: 03/24/2022 01:07    Anti-infectives (From admission, onward)    Start     Dose/Rate Route Frequency Ordered Stop   03/24/22 2000  vancomycin (VANCOCIN) IVPB 1000 mg/200 mL premix        1,000 mg 200 mL/hr over 60 Minutes Intravenous Every 12 hours 03/24/22 0527     03/24/22 1800  metroNIDAZOLE (FLAGYL) IVPB 500 mg        500 mg 100 mL/hr over 60 Minutes Intravenous Every 12 hours 03/24/22 0909     03/24/22 1000  ceFEPIme (MAXIPIME) 2 g in sodium chloride 0.9 % 100 mL IVPB        2 g 200 mL/hr over 30 Minutes Intravenous Every 8 hours 03/24/22 0523     03/24/22 0415  ceFEPIme (MAXIPIME) 2 g in sodium chloride 0.9 % 100 mL IVPB        2 g 200 mL/hr over 30 Minutes Intravenous  Once 03/24/22 0406 03/24/22 0435   03/24/22 0415  metroNIDAZOLE (FLAGYL) IVPB 500 mg        500 mg 100 mL/hr over 60 Minutes Intravenous  Once 03/24/22 0406 03/24/22 0518   03/24/22 0415  vancomycin (VANCOCIN) IVPB 1000 mg/200 mL premix  Status:  Discontinued        1,000 mg 200 mL/hr over 60 Minutes Intravenous  Once 03/24/22 0406 03/24/22 0408   03/24/22 0415  vancomycin (VANCOREADY) IVPB 2000 mg/400 mL        2,000 mg 200 mL/hr over 120 Minutes Intravenous  Once 03/24/22 0408 03/24/22 0726        Assessment/Plan Sepsis Abdominal pain, nausea, diarrhea - Patient is a poor historian but reportedly has had about 2 weeks of vague abdominal pain, possibly some n/v, and nonbloody diarrhea that started today. Abdominal exam is not very impressive but he is mildly tender supraumbilical and RUQ.  CT scan shows some wall thickening in the distal  rectosigmoid colon without a discrete mass lesion and may represent a nonspecific colitis; inflammatory changes surround the gallbladder concerning for acute cholecystitis; stable changes of cirrhosis and portal hypertension. U/s is pending. If this is equivocal will proceed with HIDA for further evaluation of the gallbladder. He also has some nonspecific colitis on CT that could be the source of his sepsis/ symptoms. Agree with broad spectrum antibiotics. Check coags in the morning. We will follow.  FEN - reg diet, NPO after MN VTE - lovenox ID - maxipime/ flagyl/ vancomycin  Bipolar disorder Anxiety DM HTN Liver cirrhosis Portal HTN Thrombocytopenia H/o bilateral BKAs PAF Polysubstance abuse Tobacco abuse Homeless  I  reviewed hospitalist notes, last 24 h vitals and pain scores, last 48 h intake and output, last 24 h labs and trends, and last 24 h imaging results  Franne Forts, [facility] Surgery 03/24/2022, 3:58 PM Please see Amion for pager number during day hours 7:00am-4:30pm or 7:00am -11:30am on weekends

## 2022-03-24 NOTE — Progress Notes (Signed)
Pharmacy Antibiotic Note  Cory Palmer is a 61 y.o. male admitted on 03/23/2022 with sepsis.  Pharmacy has been consulted for Vancomycin/Cefepime dosing. WBC is elevated. Renal function ok.   Plan: Vancomycin 1000 mg IV q12h >>>Estimated AUC: 456 Cefepime 2g IV q8h Trend WBC, temp, renal function  F/U infectious work-up Drug levels as indicated   Temp (24hrs), Avg:98.6 F (37 C), Min:97.6 F (36.4 C), Max:99.6 F (37.6 C)  Recent Labs  Lab 03/24/22 0015 03/24/22 0233 03/24/22 0400  WBC 22.5*  --   --   CREATININE 0.82  --   --   LATICACIDVEN  --  5.2* 4.4*    Estimated Creatinine Clearance: 113.2 mL/min (by C-G formula based on SCr of 0.82 mg/dL).    No Known Allergies  Abran Duke, PharmD, BCPS Clinical Pharmacist Phone: [phone]
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
## 2022-03-24 NOTE — ED Notes (Signed)
MD messaged at this time that pt recent lactic acid is 5.

## 2022-03-24 NOTE — Progress Notes (Signed)
  Carryover admission to the Day Admitter.  I discussed this case with the EDP, Trudee Grip, PA.  Per these discussions:   This is a 61 year old homeless male, with history of bilateral BKA, who is admitted with severe sepsis of unclear source after presenting with generalized weakness as well as several weeks of nonproductive cough.   Vital signs notable for mild tachycardia as well as borderline soft blood pressures, with most recent systolic blood pressure in the low 100s mmHg.   Labs notable for CBC demonstrating white cell count 22,500.  Initial lactate, repeat trending down to 4.4.  No reported diarrhea.  Chest x-ray reportedly clear.  Urinalysis currently pending.  Initial presentation was also associated with acute alcohol intoxication, offering potential contribution towards presenting lactic acidosis.   He is in the process of receiving 30 mL/kg IV fluid bolus followed by LR at 150 cc/hr.  He has also been started on broad-spectrum IV antibiotics in the form of cefepime, IV vancomycin, and Flagyl.     I have placed an order for inpatient admission to pcu for further evaluation management of severe sepsis due to unclear source.  I have placed some additional preliminary admit orders via the adult multi-morbid admission order set. I have also ordered a repeat lactic acid level to be checked at 8 AM this morning.    Newton Pigg, DO Hospitalist

## 2022-03-25 DIAGNOSIS — A419 Sepsis, unspecified organism: Secondary | ICD-10-CM | POA: Diagnosis not present

## 2022-03-25 DIAGNOSIS — R6520 Severe sepsis without septic shock: Secondary | ICD-10-CM | POA: Diagnosis not present

## 2022-03-25 LAB — COMPREHENSIVE METABOLIC PANEL
ALT: 15 U/L (ref 0–44)
AST: 28 U/L (ref 15–41)
Albumin: 2.6 g/dL — ABNORMAL LOW (ref 3.5–5.0)
Alkaline Phosphatase: 63 U/L (ref 38–126)
Anion gap: 4 — ABNORMAL LOW (ref 5–15)
BUN: 8 mg/dL (ref 8–23)
CO2: 23 mmol/L (ref 22–32)
Calcium: 7.8 mg/dL — ABNORMAL LOW (ref 8.9–10.3)
Chloride: 105 mmol/L (ref 98–111)
Creatinine, Ser: 0.79 mg/dL (ref 0.61–1.24)
GFR, Estimated: >60 mL/min (ref >60)
Glucose, Bld: 156 mg/dL — ABNORMAL HIGH (ref 70–99)
Potassium: 3.3 mmol/L — ABNORMAL LOW (ref 3.5–5.1)
Sodium: 132 mmol/L — ABNORMAL LOW (ref 135–145)
Total Bilirubin: 1.5 mg/dL — ABNORMAL HIGH (ref 0.3–1.2)
Total Protein: 4.9 g/dL — ABNORMAL LOW (ref 6.5–8.1)

## 2022-03-25 LAB — GLUCOSE, CAPILLARY
Glucose-Capillary: 125 mg/dL — ABNORMAL HIGH (ref 70–99)
Glucose-Capillary: 130 mg/dL — ABNORMAL HIGH (ref 70–99)
Glucose-Capillary: 225 mg/dL — ABNORMAL HIGH (ref 70–99)
Glucose-Capillary: 149 mg/dL — ABNORMAL HIGH (ref 70–99)

## 2022-03-25 LAB — MAGNESIUM: Magnesium: 1.5 mg/dL — ABNORMAL LOW (ref 1.7–2.4)

## 2022-03-25 LAB — CBC
HCT: 30 % — ABNORMAL LOW (ref 39.0–52.0)
Hemoglobin: 10.7 g/dL — ABNORMAL LOW (ref 13.0–17.0)
MCH: 34.1 pg — ABNORMAL HIGH (ref 26.0–34.0)
MCHC: 35.7 g/dL (ref 30.0–36.0)
MCV: 95.5 fL (ref 80.0–100.0)
Platelets: 52 10*3/uL — ABNORMAL LOW (ref 150–400)
RBC: 3.14 MIL/uL — ABNORMAL LOW (ref 4.22–5.81)
RDW: 13.6 % (ref 11.5–15.5)
WBC: 5.6 10*3/uL (ref 4.0–10.5)
nRBC: 0 % (ref 0.0–0.2)

## 2022-03-25 LAB — PHOSPHORUS: Phosphorus: 2.6 mg/dL (ref 2.5–4.6)

## 2022-03-25 LAB — MRSA NEXT GEN BY PCR, NASAL: MRSA by PCR Next Gen: DETECTED — AB

## 2022-03-25 LAB — APTT: aPTT: 39 seconds — ABNORMAL HIGH (ref 24–36)

## 2022-03-25 LAB — PROTIME-INR
INR: 1.6 — ABNORMAL HIGH (ref 0.8–1.2)
Prothrombin Time: 19.3 seconds — ABNORMAL HIGH (ref 11.4–15.2)

## 2022-03-25 MED ORDER — MAGNESIUM SULFATE 2 GM/50ML IV SOLN
2.0000 g | Freq: Once | INTRAVENOUS | Status: AC
  Administered 2022-03-25: 2 g via INTRAVENOUS
  Filled 2022-03-25: qty 50

## 2022-03-25 MED ORDER — MELATONIN 3 MG PO TABS
3.0000 mg | ORAL_TABLET | Freq: Once | ORAL | Status: AC
  Administered 2022-03-26: 3 mg via ORAL
  Filled 2022-03-25: qty 1

## 2022-03-25 MED ORDER — POTASSIUM CHLORIDE 10 MEQ/100ML IV SOLN
10.0000 meq | INTRAVENOUS | Status: AC
  Administered 2022-03-25 (×4): 10 meq via INTRAVENOUS
  Filled 2022-03-25 (×3): qty 100

## 2022-03-25 MED ORDER — ORAL CARE MOUTH RINSE
15.0000 mL | OROMUCOSAL | Status: DC | PRN
Start: 2022-03-25 — End: 2022-03-29

## 2022-03-25 NOTE — Plan of Care (Signed)
  Problem: Fluid Volume: Goal: Ability to maintain a balanced intake and output will improve Outcome: Progressing   Problem: Safety: Goal: Ability to remain free from injury will improve Outcome: Progressing   

## 2022-03-25 NOTE — Progress Notes (Signed)
[facility] Progress Note     Subjective: CC-  Feeling better today, Cory Palmer is just hungry. Cory Palmer reports mild central abdominal pain. Denies n/v. No BM since admission. WBC normalized 5.6, TMAX 99.5  Objective: Vital signs in last 24 hours: Temp:  [98.2 F (36.8 C)-99.5 F (37.5 C)] 98.4 F (36.9 C) (08/25 0744) Pulse Rate:  [69-97] 97 (08/25 0744) Resp:  [17-25] 18 (08/25 0744) BP: (98-138)/(56-91) 138/89 (08/25 0744) SpO2:  [90 %-98 %] 94 % (08/25 0744) Weight:  [116.7 kg] 116.7 kg (08/25 0500) Last BM Date : 03/24/22  Intake/Output from previous day: 08/24 0701 - 08/25 0700 In: 3832.6 [I.V.:2132.6; IV Piggyback:1700] Out: 700 [Urine:700] Intake/Output this shift: Total I/O In: -  Out: 550 [Urine:550]  PE: General: Alert, NAD Lungs: Respiratory effort nonlabored Abd: obese, soft, ND, +BS, no masses, hernias, or organomegaly. Minimal supraumbilical TTP without rebound or guarding MS: s/p bilateral BKA  Lab Results:  Recent Labs    03/24/22 0015 03/25/22 0058  WBC 22.5* 5.6  HGB 13.2 10.7*  HCT 40.9 30.0*  PLT 102* 52*   BMET Recent Labs    03/24/22 0015 03/25/22 0058  NA 138 132*  K 3.8 3.3*  CL 111 105  CO2 10* 23  GLUCOSE 171* 156*  BUN 9 8  CREATININE 0.82 0.79  CALCIUM 8.3* 7.8*   PT/INR Recent Labs    03/25/22 0058  LABPROT 19.3*  INR 1.6*   CMP     Component Value Date/Time   NA 132 (L) 03/25/2022 0058   K 3.3 (L) 03/25/2022 0058   CL 105 03/25/2022 0058   CO2 23 03/25/2022 0058   GLUCOSE 156 (H) 03/25/2022 0058   BUN 8 03/25/2022 0058   CREATININE 0.79 03/25/2022 0058   CREATININE 0.78 08/15/2019 1135   CALCIUM 7.8 (L) 03/25/2022 0058   PROT 4.9 (L) 03/25/2022 0058   ALBUMIN 2.6 (L) 03/25/2022 0058   AST 28 03/25/2022 0058   ALT 15 03/25/2022 0058   ALKPHOS 63 03/25/2022 0058   BILITOT 1.5 (H) 03/25/2022 0058   GFRNONAA >60 03/25/2022 0058   GFRAA >60 11/19/2019 1855   Lipase     Component Value Date/Time    LIPASE 29 03/24/2022 1330       Studies/Results: US Abdomen Limited RUQ (LIVER/GB)  Result Date: 03/24/2022 CLINICAL DATA:  Pain right upper quadrant EXAM: ULTRASOUND ABDOMEN LIMITED RIGHT UPPER QUADRANT COMPARISON:  CT done on 03/24/2022 FINDINGS: Gallbladder: There are no demonstrable gallbladder stones. There is wall thickening in the gallbladder measuring 5 mm which may be partly due to incomplete distention. Technologist did not observe any tenderness over the gallbladder. There is no fluid around the gallbladder. Common bile duct: Diameter: 4 mm Liver: There is nodularity of the liver surface suggesting cirrhosis. Small ascites is noted in the perihepatic region. There is recanalization of umbilical vein suggesting possible portal hypertension. Portal vein is patent on color Doppler imaging with normal direction of blood flow towards the liver. Other: None. IMPRESSION: Nodularity and liver surface is consistent with cirrhosis. No focal abnormalities are seen in visualized portions of liver. Small perihepatic ascites is present. There is wall thickening in the gallbladder which may be due to incomplete distension or chronic cholecystitis. There are no demonstrable gallbladder stones. There are no imaging signs of acute cholecystitis. Electronically Signed   By: Ernie Avena M.D.   On: 03/24/2022 16:40   CT ABDOMEN PELVIS W CONTRAST  Result Date: 03/24/2022 CLINICAL DATA:  Abdominal pain, acute,  nonlocalized EXAM: CT ABDOMEN AND PELVIS WITH CONTRAST TECHNIQUE: Multidetector CT imaging of the abdomen and pelvis was performed using the standard protocol following bolus administration of intravenous contrast. RADIATION DOSE REDUCTION: This exam was performed according to the departmental dose-optimization program which includes automated exposure control, adjustment of the mA and/or kV according to patient size and/or use of iterative reconstruction technique. CONTRAST:  OMNIPAQUE IOHEXOL  350 MG/ML SOLN COMPARISON:  CT of the abdomen and pelvis 09/13/2021 FINDINGS: Lower chest: Heart size is normal. Coronary artery calcifications are present. Mild dependent atelectasis is worse right than left. No significant pleural or pericardial effusion is present. Hepatobiliary: Shrunken irregular liver is again noted with diffuse fatty infiltration. Edematous changes surround the gallbladder with enhancement of mucosa. The common bile duct is within normal limits. Pancreas: Unremarkable. No pancreatic ductal dilatation or surrounding inflammatory changes. Spleen: Splenomegaly is stable. Spleen measures 20 cm cephalo caudad. Adrenals/Urinary Tract: The adrenal glands are within normal limits bilaterally. A simple cyst in the medial right kidney measures 2.7 cm. Recommend no follow-up imaging. No other significant renal lesions are present. No stone or mass lesion is present. No obstruction is present. Ureters are within normal limits bilaterally. The urinary bladder is unremarkable. Stomach/Bowel: Stomach and duodenum are within normal limits. Small bowel is unremarkable. There is some stranding in the small bowel mesentery. Terminal ileum is normal. The appendix is visualized and within normal limits. The ascending and transverse colon are within normal limits. Descending proximal sigmoid colon is within normal limits. There is some wall thickening in the distal rectosigmoid colon without a discrete mass. Vascular/Lymphatic: Atherosclerotic changes in the aorta branch vessels are stable. Subcentimeter periaortic nodes are stable. 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 Collaterals noted into the pelvis. Reproductive: Prostate is unremarkable. Other: No abdominal wall hernia or abnormality. No abdominopelvic ascites. Musculoskeletal: Grade 1 anterolisthesis and degenerative endplate changes are noted at L4-5. Inferior endplate Schmorl's node T9 is stable. No new lesions are present. Bony pelvis is within normal  limits. The hips are located and normal. IMPRESSION: 1. Wall thickening in the distal rectosigmoid colon without a discrete mass lesion. This may represent a nonspecific colitis. 2. Inflammatory changes surround the gallbladder concerning for acute cholecystitis. Right upper quadrant ultrasound may be useful for further evaluation. 3. Stable changes of cirrhosis and portal hypertension. 4. Coronary artery disease. 5. Aortic Atherosclerosis (ICD10-I70.0). Electronically Signed   By: Marin Roberts M.D.   On: 03/24/2022 14:23   DG Chest 1 View  Result Date: 03/24/2022 CLINICAL DATA:  Leukocytosis. EXAM: CHEST  1 VIEW COMPARISON:  Chest radiograph dated 03/19/2022. FINDINGS: The heart size and mediastinal contours are within normal limits. Both lungs are clear. The visualized skeletal structures are unremarkable. IMPRESSION: No active disease. Electronically Signed   By: Elgie Collard M.D.   On: 03/24/2022 01:07    Anti-infectives: Anti-infectives (From admission, onward)    Start     Dose/Rate Route Frequency Ordered Stop   03/24/22 2000  vancomycin (VANCOCIN) IVPB 1000 mg/200 mL premix        1,000 mg 200 mL/hr over 60 Minutes Intravenous Every 12 hours 03/24/22 0527     03/24/22 1800  metroNIDAZOLE (FLAGYL) IVPB 500 mg        500 mg 100 mL/hr over 60 Minutes Intravenous Every 12 hours 03/24/22 0909     03/24/22 1000  ceFEPIme (MAXIPIME) 2 g in sodium chloride 0.9 % 100 mL IVPB        2 g 200 mL/hr  over 30 Minutes Intravenous Every 8 hours 03/24/22 0523     03/24/22 0415  ceFEPIme (MAXIPIME) 2 g in sodium chloride 0.9 % 100 mL IVPB        2 g 200 mL/hr over 30 Minutes Intravenous  Once 03/24/22 0406 03/24/22 0435   03/24/22 0415  metroNIDAZOLE (FLAGYL) IVPB 500 mg        500 mg 100 mL/hr over 60 Minutes Intravenous  Once 03/24/22 0406 03/24/22 0518   03/24/22 0415  vancomycin (VANCOCIN) IVPB 1000 mg/200 mL premix  Status:  Discontinued        1,000 mg 200 mL/hr over 60 Minutes  Intravenous  Once 03/24/22 0406 03/24/22 0408   03/24/22 0415  vancomycin (VANCOREADY) IVPB 2000 mg/400 mL        2,000 mg 200 mL/hr over 120 Minutes Intravenous  Once 03/24/22 0408 03/24/22 0726        Assessment/Plan Sepsis Abdominal pain, nausea, diarrhea Colitis - CT scan with some wall thickening in the distal rectosigmoid colon without a discrete mass lesion and may represent a nonspecific colitis; inflammatory changes surround the gallbladder concerning for acute cholecystitis; stable changes of cirrhosis and portal hypertension.  - u/s negative for gallstones or cholecystitis - Colitis likely the source of his symptoms. Seems to be improving on antibiotics and bowel rest. Will advance to full liquids. Ultimately will need GI referral at discharge for colonoscopy, Cory Palmer has never had one before.   FEN - FLD VTE - lovenox ID - maxipime/ flagyl/ vancomycin   Bipolar disorder Anxiety DM HTN Liver cirrhosis Portal HTN Thrombocytopenia H/o bilateral BKAs PAF Polysubstance abuse Tobacco abuse Homeless   I reviewed hospitalist notes, last 24 h vitals and pain scores, last 48 h intake and output, last 24 h labs and trends    LOS: 1 day    Franne Forts, [facility] Surgery 03/25/2022, 12:41 PM Please see Amion for pager number during day hours 7:00am-4:30pm

## 2022-03-25 NOTE — Progress Notes (Signed)
PROGRESS NOTE    [name]  OTL:[id number] DOB: September 30, 1960 DOA: 03/23/2022 PCP: Hayden Rasmussen, MD   Brief Narrative:  Cory Palmer is a 61 y.o. male with medical history significant of hypertension, PAF, DM type II, liver cirrhosis, bipolar disorder, osteomyelitis, and s/p bilateral BKA who presented with complaints of feeling unwell and generally with a plethora of complaints including dry eyes, nonproductive cough, generalized abdominal discomfort unrelated to food or bowel movement, notes he has been eating quite poorly over the past 2 weeks since being evicted from his housing/shelter.  Given patient's diffuse complaints he was worked up for possible infection, he does not meet sepsis criteria given no overt source but was admitted for further evaluation and treatment given electrolyte abnormalities, symptoms and need for further imaging, evaluation.  Surgery was consulted initially given questionable CT findings as below, right quadrant ultrasound reassuring.  Assessment & Plan:   Principal Problem:   Severe sepsis ([place]) Active Problems:   Abdominal pain   Nausea vomiting and diarrhea   PAF (paroxysmal atrial fibrillation) (HCC)   Prolonged QT interval   Metabolic acidosis   Macrocytosis without anemia   Diabetes mellitus type II, non insulin dependent (HCC)   Cirrhosis of liver (HCC)   Homelessness   Depressed bipolar disorder (HCC)   Hyperbilirubinemia   Hypomagnesemia   Sepsis (HCC)    Abdominal pain, nausea, vomiting, diarrhea Rule out infectious process/gastroenteritis Lactic acidosis -Subacute over 2+ weeks coinciding with eviction from shelter -Unclear if infectious versus secondary to malnutrition/dehydration/failure to thrive -CT questionable for nonspecific colitis, questionable cholecystitis however right upper quadrant ultrasound reassuring.  No indication for HIDA scan at this point -General surgery following, appreciate insight recommendations,  nonsurgical at this point -Broad-spectrum antibiotics vancomycin, Flagyl, cefepime given unclear source -Discontinue vancomycin, low threshold to discontinue remainder of IV antibiotics -Procalcitonin minimally elevated at 2, ammonia elevated at 56, CRP minimally elevated -ESR, lipase within normal limits -Mild lactic acidosis at 3.3 -Advance diet slowly per discussion with Sx - Clear liquid-->Full liquid-->Bland as tolerated slowly   Failure to thrive, hypovolemia Profound electrolyte disturbances -Secondary to above poor p.o. intake -Continue to supplement potassium magnesium phosphorus, multivitamin, B12 and folate given current living situation and poor p.o. intake -Likely high risk for refeeding syndrome  Paroxysmal atrial fibrillation -CHA2DS2-VASc 2 -Not currently on any rate control or anticoagulation due to living situation poor follow-up and poor medical compliance -He indicates he does follow with a clinic but has not filled his prescriptions in "a while"   Prolonged QT interval -Avoid QT prolonging medications -Likely complicated by malnutrition and electrolyte disturbances   Metabolic acidosis with increased anion gap Secondary to lactic acidosis, resolved   Macrocytosis without anemia Continue vitamin supplementation, increase p.o. intake as appropriate B12, TSH, ESR within normal limits   Diabetes mellitus type 2, non insulin-dependent -Previously controlled - follow labs here -Hypoglycemic protocols -Clear liquid diet for now -CBGs before every meal with sensitive SSI   History of liver cirrhosis Chronic thrombocytopenia Previously thought possibly from history of alcohol use.  Noted to have thrombocytopenia.    -Ammonia minimally elevated at 56; lipase within normal limits   Hyperbilirubinemia -Chronic -Imaging remarkable for chronic cirrhotic changes and portal hypertension -Questionable gallbladder abnormalities on CT, ultrasound reassuring   Alcohol  use, rare -Reports rare usage, continues on CIWA protocol -On thiamine, MVI, folate    Bipolar disorder Not currently on any medications for treatment   Tobacco use Patient reports smoking a few cigarettes  Chronic ambulatory dysfunction  -Status post bilateral BKA, wheelchair-bound -Plan to follow-up with orthopedic surgery for prosthetic fitting in the next few weeks  Homelessness -Transitions of care consulted -We discussed possible disposition to shelter, he is agreeable to discharge outside the city if necessary to obtain safe living facility and support   DVT prophylaxis: Lovenox Code Status: Full Family Communication: None present  Status is: Inpatient  Dispo: The patient is from: Homeless              Anticipated d/c is to: To be determined              Anticipated d/c date is: 40 to 72 hours              Patient currently not medically stable for discharge  Consultants:  GI  Procedures:  None  Antimicrobials:  Vancomycin discontinued 03/25/2022 Continue cefepime, Flagyl  Subjective: No acute issues or events overnight, patient's review of systems remains diffusely positive indicating dry eyes, watery eyes, nonproductive cough, general malaise without fevers or chills, abdominal discomfort again generalized with no improvement or exacerbating issues with p.o. intake bowel movement or position changes.  Objective: Vitals:   03/25/22 0033 03/25/22 0429 03/25/22 0500 03/25/22 0744  BP: (!) 113/90 103/70  138/89  Pulse: 84 79  97  Resp: _0 Temp: 98.2 F (36.8 C) 98.4 F (36.9 C)  98.4 F (36.9 C)  TempSrc: Oral Oral  Oral  SpO2: 96% 92%  94%  Weight:   116.7 kg     Intake/Output Summary (Last 24 hours) at 03/25/2022 0751 Last data filed at 03/25/2022 0535 Gross per 24 hour  Intake 2932.64 ml  Output 700 ml  Net 2232.64 ml   Filed Weights   03/25/22 0500  Weight: 116.7 kg    Examination:  General:  Pleasantly resting in bed, No acute  distress. HEENT:  Normocephalic atraumatic.  Sclerae nonicteric, noninjected.  Extraocular movements intact bilaterally. Neck:  Without mass or deformity.  Trachea is midline. Lungs:  Clear to auscultate bilaterally without rhonchi, wheeze, or rales. Heart:  Regular rate and rhythm.  Without murmurs, rubs, or gallops. Abdomen:  Soft, nontender, nondistended.  Without guarding or rebound. Extremities: Without cyanosis, clubbing, edema, bilateral BKA Vascular:  Dorsalis pedis and posterior tibial pulses palpable bilaterally. Skin:  Warm and dry, no erythema, no ulcerations.   Data Reviewed: I have personally reviewed following labs and imaging studies  CBC: Recent Labs  Lab 03/24/22 0015 03/25/22 0058  WBC 22.5* 5.6  HGB 13.2 10.7*  HCT 40.9 30.0*  MCV 104.3* 95.5  PLT 102* 52*   Basic Metabolic Panel: Recent Labs  Lab 03/24/22 0015 03/24/22 0942 03/25/22 0058  NA 138  --  132*  K 3.8  --  3.3*  CL 111  --  105  CO2 10*  --  23  GLUCOSE 171*  --  156*  BUN 9  --  8  CREATININE 0.82  --  0.79  CALCIUM 8.3*  --  7.8*  MG  --  1.0* 1.5*  PHOS  --  1.6* 2.6   GFR: Estimated Creatinine Clearance: 120.3 mL/min (by C-G formula based on SCr of 0.79 mg/dL). Liver Function Tests: Recent Labs  Lab 03/24/22 0400 03/25/22 0058  AST 29 28  ALT 16 15  ALKPHOS 75 63  BILITOT 2.3* 1.5*  PROT 5.8* 4.9*  ALBUMIN 3.1* 2.6*   Recent Labs  Lab 03/24/22 1330  LIPASE 29  Recent Labs  Lab 03/24/22 1833  AMMONIA 56*   Coagulation Profile: Recent Labs  Lab 03/25/22 0058  INR 1.6*   Cardiac Enzymes: No results for input(s): "CKTOTAL", "CKMB", "CKMBINDEX", "TROPONINI" in the last 168 hours. BNP (last 3 results) No results for input(s): "PROBNP" in the last 8760 hours. HbA1C: No results for input(s): "HGBA1C" in the last 72 hours. CBG: Recent Labs  Lab 03/24/22 1705 03/24/22 2211  GLUCAP 121* 179*   Lipid Profile: No results for input(s): "CHOL", "HDL",  "LDLCALC", "TRIG", "CHOLHDL", "LDLDIRECT" in the last 72 hours. Thyroid Function Tests: Recent Labs    03/24/22 0942  TSH 0.628   Anemia Panel: Recent Labs    03/24/22 0942  VITAMINB12 361   Sepsis Labs: Recent Labs  Lab 03/24/22 0400 03/24/22 0729 03/24/22 0942 03/24/22 1124 03/24/22 1833  PROCALCITON  --   --  2.13  --   --   LATICACIDVEN 4.4* 5.0*  --  3.3* 1.7    Recent Results (from the past 240 hour(s))  SARS Coronavirus 2 by RT PCR (hospital order, performed in [facility] hospital lab) *cepheid single result test* Anterior Nasal Swab     Status: None   Collection Time: 03/24/22  3:59 AM   Specimen: Anterior Nasal Swab  Result Value Ref Range Status   SARS Coronavirus 2 by RT PCR NEGATIVE NEGATIVE Final    Comment: (NOTE) SARS-CoV-2 target nucleic acids are NOT DETECTED.  The SARS-CoV-2 RNA is generally detectable in upper and lower respiratory specimens during the acute phase of infection. The lowest concentration of SARS-CoV-2 viral copies this assay can detect is 250 copies / mL. A negative result does not preclude SARS-CoV-2 infection and should not be used as the sole basis for treatment or other patient management decisions.  A negative result may occur with improper specimen collection / handling, submission of specimen other than nasopharyngeal swab, presence of viral mutation(s) within the areas targeted by this assay, and inadequate number of viral copies (<250 copies / mL). A negative result must be combined with clinical observations, patient history, and epidemiological information.  Fact Sheet for Patients:   https://www.patel.info/  Fact Sheet for Healthcare Providers: https://hall.com/  This test is not yet approved or  cleared by the [place] FDA and has been authorized for detection and/or diagnosis of SARS-CoV-2 by FDA under an Emergency Use Authorization (EUA).  This EUA will remain in  effect (meaning this test can be used) for the duration of the COVID-19 declaration under Section 564(b)(1) of the Act, 21 U.S.C. section 360bbb-3(b)(1), unless the authorization is terminated or revoked sooner.  Performed at [facility], [place] [address] [place], [place] [address]          Radiology Studies: US Abdomen Limited RUQ (LIVER/GB)  Result Date: 03/24/2022 CLINICAL DATA:  Pain right upper quadrant EXAM: ULTRASOUND ABDOMEN LIMITED RIGHT UPPER QUADRANT COMPARISON:  CT done on 03/24/2022 FINDINGS: Gallbladder: There are no demonstrable gallbladder stones. There is wall thickening in the gallbladder measuring 5 mm which may be partly due to incomplete distention. Technologist did not observe any tenderness over the gallbladder. There is no fluid around the gallbladder. Common bile duct: Diameter: 4 mm Liver: There is nodularity of the liver surface suggesting cirrhosis. Small ascites is noted in the perihepatic region. There is recanalization of umbilical vein suggesting possible portal hypertension. Portal vein is patent on color Doppler imaging with normal direction of blood flow towards the liver. Other: None. IMPRESSION: Nodularity and liver  surface is consistent with cirrhosis. No focal abnormalities are seen in visualized portions of liver. Small perihepatic ascites is present. There is wall thickening in the gallbladder which may be due to incomplete distension or chronic cholecystitis. There are no demonstrable gallbladder stones. There are no imaging signs of acute cholecystitis. Electronically Signed   By: Elmer Picker M.D.   On: 03/24/2022 16:40   CT ABDOMEN PELVIS W CONTRAST  Result Date: 03/24/2022 CLINICAL DATA:  Abdominal pain, acute, nonlocalized EXAM: CT ABDOMEN AND PELVIS WITH CONTRAST TECHNIQUE: Multidetector CT imaging of the abdomen and pelvis was performed using the standard protocol following bolus administration of intravenous contrast. RADIATION DOSE  REDUCTION: This exam was performed according to the departmental dose-optimization program which includes automated exposure control, adjustment of the mA and/or kV according to patient size and/or use of iterative reconstruction technique. CONTRAST:  139m OMNIPAQUE IOHEXOL 350 MG/ML SOLN COMPARISON:  CT of the abdomen and pelvis 09/13/2021 FINDINGS: Lower chest: Heart size is normal. Coronary artery calcifications are present. Mild dependent atelectasis is worse right than left. No significant pleural or pericardial effusion is present. Hepatobiliary: Shrunken irregular liver is again noted with diffuse fatty infiltration. Edematous changes surround the gallbladder with enhancement of mucosa. The common bile duct is within normal limits. Pancreas: Unremarkable. No pancreatic ductal dilatation or surrounding inflammatory changes. Spleen: Splenomegaly is stable. Spleen measures 20 cm cephalo caudad. Adrenals/Urinary Tract: The adrenal glands are within normal limits bilaterally. A simple cyst in the medial right kidney measures 2.7 cm. Recommend no follow-up imaging. No other significant renal lesions are present. No stone or mass lesion is present. No obstruction is present. Ureters are within normal limits bilaterally. The urinary bladder is unremarkable. Stomach/Bowel: Stomach and duodenum are within normal limits. Small bowel is unremarkable. There is some stranding in the small bowel mesentery. Terminal ileum is normal. The appendix is visualized and within normal limits. The ascending and transverse colon are within normal limits. Descending proximal sigmoid colon is within normal limits. There is some wall thickening in the distal rectosigmoid colon without a discrete mass. Vascular/Lymphatic: Atherosclerotic changes in the aorta branch vessels are stable. Subcentimeter periaortic nodes are stable. 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 Collaterals noted into the pelvis. Reproductive: Prostate is unremarkable.  Other: No abdominal wall hernia or abnormality. No abdominopelvic ascites. Musculoskeletal: Grade 1 anterolisthesis and degenerative endplate changes are noted at L4-5. Inferior endplate Schmorl's node T9 is stable. No new lesions are present. Bony pelvis is within normal limits. The hips are located and normal. IMPRESSION: 1. Wall thickening in the distal rectosigmoid colon without a discrete mass lesion. This may represent a nonspecific colitis. 2. Inflammatory changes surround the gallbladder concerning for acute cholecystitis. Right upper quadrant ultrasound may be useful for further evaluation. 3. Stable changes of cirrhosis and portal hypertension. 4. Coronary artery disease. 5. Aortic Atherosclerosis (ICD10-I70.0). Electronically Signed   By: CSan MorelleM.D.   On: 03/24/2022 14:23   DG Chest 1 View  Result Date: 03/24/2022 CLINICAL DATA:  Leukocytosis. EXAM: CHEST  1 VIEW COMPARISON:  Chest radiograph dated 03/19/2022. FINDINGS: The heart size and mediastinal contours are within normal limits. Both lungs are clear. The visualized skeletal structures are unremarkable. IMPRESSION: No active disease. Electronically Signed   By: AAnner CreteM.D.   On: 03/24/2022 01:07    Scheduled Meds:  enoxaparin (LOVENOX) injection  40 mg Subcutaneous [id number]  folic acid  1 mg Oral Daily   insulin aspart  0-9 Units Subcutaneous TID WC  multivitamin with minerals  1 tablet Oral Daily   nicotine  7 mg Transdermal Daily   sodium chloride flush  3 mL Intravenous Q12H   thiamine  100 mg Oral Daily   Or   thiamine  100 mg Intravenous Daily   Continuous Infusions:  ceFEPime (MAXIPIME) IV 2 g (03/25/22 0535)   metronidazole 500 mg (03/25/22 0534)   vancomycin Stopped (03/24/22 2200)     LOS: 1 day   Time spent: 29mn  Raelynn Corron C Kathrene Sinopoli, DO [facility]  If 7PM-7AM, please contact night-coverage [email or web address]  03/25/2022, 7:51 AM

## 2022-03-25 NOTE — Progress Notes (Addendum)
Patient is having lose stool 5X since this afternoon, on call MD notified. stool sample for C diff sent.

## 2022-03-25 NOTE — Progress Notes (Signed)
Received call from lab that his platelets is down to 52 from 102, no any bleeding noted. Md Greggory Stallion notified.

## 2022-03-25 NOTE — Progress Notes (Signed)
Noticed that he is not on tele and continuous pulse ox when there is order for it. Clarify with on call MD Greggory Stallion, he said to keep on tele and no need continuous pulse oxi.

## 2022-03-26 DIAGNOSIS — R6520 Severe sepsis without septic shock: Secondary | ICD-10-CM | POA: Diagnosis not present

## 2022-03-26 DIAGNOSIS — A419 Sepsis, unspecified organism: Secondary | ICD-10-CM | POA: Diagnosis not present

## 2022-03-26 LAB — GASTROINTESTINAL PANEL BY PCR, STOOL (REPLACES STOOL CULTURE)

## 2022-03-26 LAB — CBC
HCT: 29.1 % — ABNORMAL LOW (ref 39.0–52.0)
Hemoglobin: 10.6 g/dL — ABNORMAL LOW (ref 13.0–17.0)
MCH: 34 pg (ref 26.0–34.0)
MCHC: 36.4 g/dL — ABNORMAL HIGH (ref 30.0–36.0)
MCV: 93.3 fL (ref 80.0–100.0)
Platelets: 54 10*3/uL — ABNORMAL LOW (ref 150–400)
RBC: 3.12 MIL/uL — ABNORMAL LOW (ref 4.22–5.81)
RDW: 13.3 % (ref 11.5–15.5)
WBC: 5 10*3/uL (ref 4.0–10.5)
nRBC: 0 % (ref 0.0–0.2)

## 2022-03-26 LAB — GLUCOSE, CAPILLARY
Glucose-Capillary: 124 mg/dL — ABNORMAL HIGH (ref 70–99)
Glucose-Capillary: 132 mg/dL — ABNORMAL HIGH (ref 70–99)
Glucose-Capillary: 104 mg/dL — ABNORMAL HIGH (ref 70–99)
Glucose-Capillary: 132 mg/dL — ABNORMAL HIGH (ref 70–99)

## 2022-03-26 LAB — COMPREHENSIVE METABOLIC PANEL
ALT: 16 U/L (ref 0–44)
AST: 28 U/L (ref 15–41)
Albumin: 2.7 g/dL — ABNORMAL LOW (ref 3.5–5.0)
Alkaline Phosphatase: 82 U/L (ref 38–126)
Anion gap: 6 (ref 5–15)
BUN: 5 mg/dL — ABNORMAL LOW (ref 8–23)
CO2: 21 mmol/L — ABNORMAL LOW (ref 22–32)
Calcium: 7.9 mg/dL — ABNORMAL LOW (ref 8.9–10.3)
Chloride: 106 mmol/L (ref 98–111)
Creatinine, Ser: 0.67 mg/dL (ref 0.61–1.24)
GFR, Estimated: >60 mL/min (ref >60)
Glucose, Bld: 133 mg/dL — ABNORMAL HIGH (ref 70–99)
Potassium: 3.4 mmol/L — ABNORMAL LOW (ref 3.5–5.1)
Sodium: 133 mmol/L — ABNORMAL LOW (ref 135–145)
Total Bilirubin: 1.3 mg/dL — ABNORMAL HIGH (ref 0.3–1.2)
Total Protein: 5.1 g/dL — ABNORMAL LOW (ref 6.5–8.1)

## 2022-03-26 LAB — MAGNESIUM: Magnesium: 1.5 mg/dL — ABNORMAL LOW (ref 1.7–2.4)

## 2022-03-26 LAB — PHOSPHORUS: Phosphorus: 2.9 mg/dL (ref 2.5–4.6)

## 2022-03-26 MED ORDER — MAGNESIUM SULFATE 2 GM/50ML IV SOLN
2.0000 g | Freq: Once | INTRAVENOUS | Status: AC
Start: 2022-03-26 — End: 2022-03-26
  Administered 2022-03-26: 2 g via INTRAVENOUS
  Filled 2022-03-26: qty 50

## 2022-03-26 MED ORDER — POTASSIUM CHLORIDE 20 MEQ PO PACK
40.0000 meq | PACK | Freq: Once | ORAL | Status: AC
  Administered 2022-03-26: 40 meq via ORAL
  Filled 2022-03-26: qty 2

## 2022-03-26 NOTE — Progress Notes (Signed)
Subjective/Chief Complaint: Patient hungry less pain   Objective: Vital signs in last 24 hours: Temp:  [98 F (36.7 C)-99.7 F (37.6 C)] 98.8 F (37.1 C) (08/26 0846) Pulse Rate:  [82-99] 82 (08/26 0845) Resp:  [16-20] 16 (08/26 0845) BP: (99-134)/(66-90) 134/80 (08/26 0845) SpO2:  [94 %-98 %] 96 % (08/26 0845) Weight:  [112.7 kg] 112.7 kg (08/26 0500) Last BM Date : 03/25/22  Intake/Output from previous day: 08/25 0701 - 08/26 0700 In: 1699.5 [P.O.:480; IV Piggyback:1219.5] Out: 3050 [Urine:3050] Intake/Output this shift: No intake/output data recorded.   Lab Results:  Recent Labs    03/25/22 0058 03/26/22 0139  WBC 5.6 5.0  HGB 10.7* 10.6*  HCT 30.0* 29.1*  PLT 52* 54*   BMET Recent Labs    03/25/22 0058 03/26/22 0139  NA 132* 133*  K 3.3* 3.4*  CL 105 106  CO2 23 21*  GLUCOSE 156* 133*  BUN 8 5*  CREATININE 0.79 0.67  CALCIUM 7.8* 7.9*   PT/INR Recent Labs    03/25/22 0058  LABPROT 19.3*  INR 1.6*   ABG No results for input(s): "PHART", "HCO3" in the last 72 hours.  Invalid input(s): "PCO2", "PO2"  Studies/Results: US Abdomen Limited RUQ (LIVER/GB)  Result Date: 03/24/2022 CLINICAL DATA:  Pain right upper quadrant EXAM: ULTRASOUND ABDOMEN LIMITED RIGHT UPPER QUADRANT COMPARISON:  CT done on 03/24/2022 FINDINGS: Gallbladder: There are no demonstrable gallbladder stones. There is wall thickening in the gallbladder measuring 5 mm which may be partly due to incomplete distention. Technologist did not observe any tenderness over the gallbladder. There is no fluid around the gallbladder. Common bile duct: Diameter: 4 mm Liver: There is nodularity of the liver surface suggesting cirrhosis. Small ascites is noted in the perihepatic region. There is recanalization of umbilical vein suggesting possible portal hypertension. Portal vein is patent on color Doppler imaging with normal direction of blood flow towards the liver. Other: None. IMPRESSION:  Nodularity and liver surface is consistent with cirrhosis. No focal abnormalities are seen in visualized portions of liver. Small perihepatic ascites is present. There is wall thickening in the gallbladder which may be due to incomplete distension or chronic cholecystitis. There are no demonstrable gallbladder stones. There are no imaging signs of acute cholecystitis. Electronically Signed   By: Ernie Avena M.D.   On: 03/24/2022 16:40   CT ABDOMEN PELVIS W CONTRAST  Result Date: 03/24/2022 CLINICAL DATA:  Abdominal pain, acute, nonlocalized EXAM: CT ABDOMEN AND PELVIS WITH CONTRAST TECHNIQUE: Multidetector CT imaging of the abdomen and pelvis was performed using the standard protocol following bolus administration of intravenous contrast. RADIATION DOSE REDUCTION: This exam was performed according to the departmental dose-optimization program which includes automated exposure control, adjustment of the mA and/or kV according to patient size and/or use of iterative reconstruction technique. CONTRAST:  OMNIPAQUE IOHEXOL 350 MG/ML SOLN COMPARISON:  CT of the abdomen and pelvis 09/13/2021 FINDINGS: Lower chest: Heart size is normal. Coronary artery calcifications are present. Mild dependent atelectasis is worse right than left. No significant pleural or pericardial effusion is present. Hepatobiliary: Shrunken irregular liver is again noted with diffuse fatty infiltration. Edematous changes surround the gallbladder with enhancement of mucosa. The common bile duct is within normal limits. Pancreas: Unremarkable. No pancreatic ductal dilatation or surrounding inflammatory changes. Spleen: Splenomegaly is stable. Spleen measures 20 cm cephalo caudad. Adrenals/Urinary Tract: The adrenal glands are within normal limits bilaterally. A simple cyst in the medial right kidney measures 2.7 cm. Recommend no follow-up imaging. No  other significant renal lesions are present. No stone or mass lesion is present. No  obstruction is present. Ureters are within normal limits bilaterally. The urinary bladder is unremarkable. Stomach/Bowel: Stomach and duodenum are within normal limits. Small bowel is unremarkable. There is some stranding in the small bowel mesentery. Terminal ileum is normal. The appendix is visualized and within normal limits. The ascending and transverse colon are within normal limits. Descending proximal sigmoid colon is within normal limits. There is some wall thickening in the distal rectosigmoid colon without a discrete mass. Vascular/Lymphatic: Atherosclerotic changes in the aorta branch vessels are stable. Subcentimeter periaortic nodes are stable. 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 Collaterals noted into the pelvis. Reproductive: Prostate is unremarkable. Other: No abdominal wall hernia or abnormality. No abdominopelvic ascites. Musculoskeletal: Grade 1 anterolisthesis and degenerative endplate changes are noted at L4-5. Inferior endplate Schmorl's node T9 is stable. No new lesions are present. Bony pelvis is within normal limits. The hips are located and normal. IMPRESSION: 1. Wall thickening in the distal rectosigmoid colon without a discrete mass lesion. This may represent a nonspecific colitis. 2. Inflammatory changes surround the gallbladder concerning for acute cholecystitis. Right upper quadrant ultrasound may be useful for further evaluation. 3. Stable changes of cirrhosis and portal hypertension. 4. Coronary artery disease. 5. Aortic Atherosclerosis (ICD10-I70.0). Electronically Signed   By: San Morelle M.D.   On: 03/24/2022 14:23    Anti-infectives: Anti-infectives (From admission, onward)    Start     Dose/Rate Route Frequency Ordered Stop   03/24/22 2000  vancomycin (VANCOCIN) IVPB 1000 mg/200 mL premix  Status:  Discontinued        1,000 mg 200 mL/hr over 60 Minutes Intravenous Every 12 hours 03/24/22 0527 03/25/22 1449   03/24/22 1800  metroNIDAZOLE (FLAGYL) IVPB 500 mg         500 mg 100 mL/hr over 60 Minutes Intravenous Every 12 hours 03/24/22 0909     03/24/22 1000  ceFEPIme (MAXIPIME) 2 g in sodium chloride 0.9 % 100 mL IVPB        2 g 200 mL/hr over 30 Minutes Intravenous Every 8 hours 03/24/22 0523     03/24/22 0415  ceFEPIme (MAXIPIME) 2 g in sodium chloride 0.9 % 100 mL IVPB        2 g 200 mL/hr over 30 Minutes Intravenous  Once 03/24/22 0406 03/24/22 0435   03/24/22 0415  metroNIDAZOLE (FLAGYL) IVPB 500 mg        500 mg 100 mL/hr over 60 Minutes Intravenous  Once 03/24/22 0406 03/24/22 0518   03/24/22 0415  vancomycin (VANCOCIN) IVPB 1000 mg/200 mL premix  Status:  Discontinued        1,000 mg 200 mL/hr over 60 Minutes Intravenous  Once 03/24/22 0406 03/24/22 0408   03/24/22 0415  vancomycin (VANCOREADY) IVPB 2000 mg/400 mL        2,000 mg 200 mL/hr over 120 Minutes Intravenous  Once 03/24/22 0408 03/24/22 0726       Assessment/Plan: epsis Abdominal pain, nausea, diarrhea Colitis - CT scan with some wall thickening in the distal rectosigmoid colon without a discrete mass lesion and may represent a nonspecific colitis; inflammatory changes surround the gallbladder concerning for acute cholecystitis; stable changes of cirrhosis and portal hypertension.  - u/s negative for gallstones or cholecystitis - Colitis likely the source of his symptoms. Seems to be improving on antibiotics and bowel rest. Will advance to full liquids. Ultimately will need GI referral at discharge for colonoscopy, he has  never had one before. Advance diet  We will sign off with no acute surgical need at this point FEN - FLD VTE - lovenox ID - maxipime/ flagyl/ vancomycin   Bipolar disorder Anxiety DM HTN Liver cirrhosis Portal HTN Thrombocytopenia H/o bilateral BKAs PAF Polysubstance abuse Tobacco abuse Homeless   LOS: 2 days    Mihcael A Luchiano Viscomi 03/26/2022 Total time 20 minutes

## 2022-03-26 NOTE — Progress Notes (Signed)
PROGRESS NOTE    Cory Palmer  XVQ:[id number] DOB: [birth year] DOA: 03/23/2022 PCP: Hayden Rasmussen, MD   Brief Narrative:  Cory Palmer is a 61 y.o. male with medical history significant of hypertension, PAF, DM type II, liver cirrhosis, bipolar disorder, osteomyelitis, and s/p bilateral BKA who presented with complaints of feeling unwell and generally with a plethora of complaints including dry eyes, nonproductive cough, generalized abdominal discomfort unrelated to food or bowel movement, notes he has been eating quite poorly over the past 2 weeks since being evicted from his housing/shelter.  Given patient's diffuse complaints he was worked up for possible infection, he does not meet sepsis criteria given no overt source but was admitted for further evaluation and treatment given electrolyte abnormalities, symptoms and need for further imaging, evaluation.  Surgery was consulted initially given questionable CT findings as below, right quadrant ultrasound reassuring.  Assessment & Plan:   Principal Problem:   Severe sepsis ([place]) Active Problems:   Abdominal pain   Nausea vomiting and diarrhea   PAF (paroxysmal atrial fibrillation) (HCC)   Prolonged QT interval   Metabolic acidosis   Macrocytosis without anemia   Diabetes mellitus type II, non insulin dependent (HCC)   Cirrhosis of liver (HCC)   Homelessness   Depressed bipolar disorder (HCC)   Hyperbilirubinemia   Hypomagnesemia   Sepsis (HCC)  Abdominal pain, nausea, vomiting, diarrhea Rule out infectious process/gastroenteritis Lactic acidosis -Subacute over 2+ weeks coinciding with eviction from shelter -Diffuse watery bowel movement overnight, GI panel pending -CT questionable for nonspecific colitis, questionable cholecystitis however right upper quadrant ultrasound reassuring.  No indication for HIDA scan at this point -General surgery following, appreciate insight recommendations, nonsurgical at this  point -Broad-spectrum antibiotics vancomycin, Flagyl, cefepime given unclear source -Discontinue vancomycin, low threshold to discontinue remainder of IV antibiotics -Procalcitonin minimally elevated at 2 -Advance diet slowly per discussion with Sx - Clear liquid-->Full liquid-->Bland as tolerated slowly   Failure to thrive, hypovolemia Profound electrolyte disturbances -Secondary to above poor p.o. intake -reports poor access to food over the last 2 weeks given homeless status -Continue to supplement potassium magnesium phosphorus, multivitamin, B12 and folate given current living situation and poor p.o. intake -Likely high risk for refeeding syndrome  Paroxysmal atrial fibrillation -CHA2DS2-VASc 2 -Not currently on any rate control or anticoagulation due to living situation poor follow-up and poor medical compliance -He indicates he does follow with a clinic but has not filled his prescriptions in "a while"   Prolonged QT interval -Avoid QT prolonging medications -Likely complicated by malnutrition and electrolyte disturbances   Metabolic acidosis with increased anion gap Secondary to lactic acidosis, resolved   Macrocytosis without anemia Continue vitamin supplementation, increase p.o. intake as appropriate B12, TSH, ESR within normal limits   Diabetes mellitus type 2, non insulin-dependent -Previously controlled - follow labs here -Hypoglycemic protocols -Clear liquid diet for now -CBGs before every meal with sensitive SSI   History of liver cirrhosis Chronic thrombocytopenia Previously thought possibly from history of alcohol use.  Noted to have thrombocytopenia.    -Ammonia minimally elevated at 56; lipase within normal limits   Hyperbilirubinemia -Chronic -Imaging remarkable for chronic cirrhotic changes and portal hypertension -Questionable gallbladder abnormalities on CT, ultrasound reassuring   Alcohol use, rare -Reports rare usage, continues on CIWA  protocol -On thiamine, MVI, folate    Bipolar disorder Not currently on any medications for treatment   Tobacco use Patient reports smoking a few cigarettes   Chronic ambulatory dysfunction  -Status  post bilateral BKA, wheelchair-bound -Plan to follow-up with orthopedic surgery for prosthetic fitting in the next few weeks  Noncompliance, profound  -Likely exacerbated by his homeless status, reports he does follow with a clinic "on occasion" but does not appear to fill any prescriptions regularly per our discussion  Homelessness -Transitions of care consulted -We discussed possible disposition to shelter, he is agreeable to discharge outside the city if necessary to obtain safe living facility and support   DVT prophylaxis: Lovenox Code Status: Full Family Communication: None present  Status is: Inpatient  Dispo: The patient is from: Homeless              Anticipated d/c is to: To be determined              Anticipated d/c date is: 40 to 72 hours              Patient currently not medically stable for discharge  Consultants:  GI  Procedures:  None  Antimicrobials:  Vancomycin discontinued 03/25/2022 Continue cefepime, Flagyl  Subjective: Increased watery bowel movements overnight -reports upwards of 10 over the past 12 hours.  Able to tolerate p.o. denies any nausea or vomiting.  Objective: Vitals:   03/25/22 1537 03/25/22 1540 03/25/22 2116 03/26/22 0500  BP: 99/66 111/88 133/84 (!) 134/90  Pulse: 85 82 99 98  Resp: _0 Temp: 98 F (36.7 C) 98 F (36.7 C) 99.7 F (37.6 C)   TempSrc: Oral  Oral   SpO2: 98% 97% 96% 94%  Weight:    112.7 kg    Intake/Output Summary (Last 24 hours) at 03/26/2022 0818 Last data filed at 03/26/2022 0532 Gross per 24 hour  Intake 1699.52 ml  Output 3050 ml  Net -1350.48 ml    Filed Weights   03/25/22 0500 03/26/22 0500  Weight: 116.7 kg 112.7 kg    Examination:  General:  Pleasantly resting in bed, No acute  distress. HEENT:  Normocephalic atraumatic.  Sclerae nonicteric, noninjected.  Extraocular movements intact bilaterally. Neck:  Without mass or deformity.  Trachea is midline. Lungs:  Clear to auscultate bilaterally without rhonchi, wheeze, or rales. Heart:  Regular rate and rhythm.  Without murmurs, rubs, or gallops. Abdomen:  Soft, nontender, nondistended.  Without guarding or rebound. Extremities: Without cyanosis, clubbing, edema, bilateral BKA Vascular:  Dorsalis pedis and posterior tibial pulses palpable bilaterally. Skin:  Warm and dry, no erythema, no ulcerations.   Data Reviewed: I have personally reviewed following labs and imaging studies  CBC: Recent Labs  Lab 03/24/22 0015 03/25/22 0058 03/26/22 0139  WBC 22.5* 5.6 5.0  HGB 13.2 10.7* 10.6*  HCT 40.9 30.0* 29.1*  MCV 104.3* 95.5 93.3  PLT 102* 52* 54*    Basic Metabolic Panel: Recent Labs  Lab 03/24/22 0015 03/24/22 0942 03/25/22 0058 03/26/22 0139  NA 138  --  132* 133*  K 3.8  --  3.3* 3.4*  CL 111  --  105 106  CO2 10*  --  23 21*  GLUCOSE 171*  --  156* 133*  BUN 9  --  8 5*  CREATININE 0.82  --  0.79 0.67  CALCIUM 8.3*  --  7.8* 7.9*  MG  --  1.0* 1.5* 1.5*  PHOS  --  1.6* 2.6 2.9    GFR: Estimated Creatinine Clearance: 118.1 mL/min (by C-G formula based on SCr of 0.67 mg/dL). Liver Function Tests: Recent Labs  Lab 03/24/22 0400 03/25/22 0058 03/26/22 0139  AST 29  28 28  ALT _0 ALKPHOS 75 63 82  BILITOT 2.3* 1.5* 1.3*  PROT 5.8* 4.9* 5.1*  ALBUMIN 3.1* 2.6* 2.7*    Recent Labs  Lab 03/24/22 1330  LIPASE 29    Recent Labs  Lab 03/24/22 1833  AMMONIA 56*    Coagulation Profile: Recent Labs  Lab 03/25/22 0058  INR 1.6*    Cardiac Enzymes: No results for input(s): "CKTOTAL", "CKMB", "CKMBINDEX", "TROPONINI" in the last 168 hours. BNP (last 3 results) No results for input(s): "PROBNP" in the last 8760 hours. HbA1C: No results for input(s): "HGBA1C" in the last 72  hours. CBG: Recent Labs  Lab 03/25/22 0857 03/25/22 1128 03/25/22 1536 03/25/22 2119 03/26/22 0741  GLUCAP 125* 130* 225* 149* 124*    Lipid Profile: No results for input(s): "CHOL", "HDL", "LDLCALC", "TRIG", "CHOLHDL", "LDLDIRECT" in the last 72 hours. Thyroid Function Tests: Recent Labs    03/24/22 0942  TSH 0.628    Anemia Panel: Recent Labs    03/24/22 0942  VITAMINB12 361    Sepsis Labs: Recent Labs  Lab 03/24/22 0400 03/24/22 0729 03/24/22 0942 03/24/22 1124 03/24/22 1833  PROCALCITON  --   --  2.13  --   --   LATICACIDVEN 4.4* 5.0*  --  3.3* 1.7     Recent Results (from the past 240 hour(s))  SARS Coronavirus 2 by RT PCR (hospital order, performed in [facility] hospital lab) *cepheid single result test* Anterior Nasal Swab     Status: None   Collection Time: 03/24/22  3:59 AM   Specimen: Anterior Nasal Swab  Result Value Ref Range Status   SARS Coronavirus 2 by RT PCR NEGATIVE NEGATIVE Final    Comment: (NOTE) SARS-CoV-2 target nucleic acids are NOT DETECTED.  The SARS-CoV-2 RNA is generally detectable in upper and lower respiratory specimens during the acute phase of infection. The lowest concentration of SARS-CoV-2 viral copies this assay can detect is 250 copies / mL. A negative result does not preclude SARS-CoV-2 infection and should not be used as the sole basis for treatment or other patient management decisions.  A negative result may occur with improper specimen collection / handling, submission of specimen other than nasopharyngeal swab, presence of viral mutation(s) within the areas targeted by this assay, and inadequate number of viral copies (<250 copies / mL). A negative result must be combined with clinical observations, patient history, and epidemiological information.  Fact Sheet for Patients:   https://www.patel.info/  Fact Sheet for Healthcare Providers: https://hall.com/  This test  is not yet approved or  cleared by the [place] FDA and has been authorized for detection and/or diagnosis of SARS-CoV-2 by FDA under an Emergency Use Authorization (EUA).  This EUA will remain in effect (meaning this test can be used) for the duration of the COVID-19 declaration under Section 564(b)(1) of the Act, 21 U.S.C. section 360bbb-3(b)(1), unless the authorization is terminated or revoked sooner.  Performed at [facility], [place] [address] [place],  [address]   Blood culture (routine x 2)     Status: None (Preliminary result)   Collection Time: 03/24/22  4:10 AM   Specimen: BLOOD RIGHT WRIST  Result Value Ref Range Status   Specimen Description BLOOD RIGHT WRIST  Final   Special Requests   Final    BOTTLES DRAWN AEROBIC AND ANAEROBIC Blood Culture adequate volume   Culture   Final    NO GROWTH 2 DAYS Performed at [facility],  [address], [place], [place] [address]    Report Status PENDING  Incomplete  Blood culture (routine x 2)     Status: None (Preliminary result)   Collection Time: 03/24/22  4:15 AM   Specimen: BLOOD LEFT FOREARM  Result Value Ref Range Status   Specimen Description BLOOD LEFT FOREARM  Final   Special Requests   Final    BOTTLES DRAWN AEROBIC AND ANAEROBIC Blood Culture results may not be optimal due to an excessive volume of blood received in culture bottles   Culture   Final    NO GROWTH 2 DAYS Performed at [facility], [place] [address] [place], [place] [address]    Report Status PENDING  Incomplete  MRSA Next Gen by PCR, Nasal     Status: Abnormal   Collection Time: 03/25/22 10:17 AM   Specimen: Nasal Mucosa; Nasal Swab  Result Value Ref Range Status   MRSA by PCR Next Gen DETECTED (A) NOT DETECTED Final    Comment: RESULT CALLED TO, READ BACK BY AND VERIFIED WITH: L. YOUNG RN, AT 1418 03/25/22 D. VANHOOK (NOTE) The GeneXpert MRSA Assay (FDA approved for NASAL specimens only), is one component of a  comprehensive MRSA colonization surveillance program. It is not intended to diagnose MRSA infection nor to guide or monitor treatment for MRSA infections. Test performance is not FDA approved in patients less than 73 years old. Performed at [facility], [place] [address], [place], [place] [address]          Radiology Studies: US Abdomen Limited RUQ (LIVER/GB)  Result Date: 03/24/2022 CLINICAL DATA:  Pain right upper quadrant EXAM: ULTRASOUND ABDOMEN LIMITED RIGHT UPPER QUADRANT COMPARISON:  CT done on 03/24/2022 FINDINGS: Gallbladder: There are no demonstrable gallbladder stones. There is wall thickening in the gallbladder measuring 5 mm which may be partly due to incomplete distention. Technologist did not observe any tenderness over the gallbladder. There is no fluid around the gallbladder. Common bile duct: Diameter: 4 mm Liver: There is nodularity of the liver surface suggesting cirrhosis. Small ascites is noted in the perihepatic region. There is recanalization of umbilical vein suggesting possible portal hypertension. Portal vein is patent on color Doppler imaging with normal direction of blood flow towards the liver. Other: None. IMPRESSION: Nodularity and liver surface is consistent with cirrhosis. No focal abnormalities are seen in visualized portions of liver. Small perihepatic ascites is present. There is wall thickening in the gallbladder which may be due to incomplete distension or chronic cholecystitis. There are no demonstrable gallbladder stones. There are no imaging signs of acute cholecystitis. Electronically Signed   By: Elmer Picker M.D.   On: 03/24/2022 16:40   CT ABDOMEN PELVIS W CONTRAST  Result Date: 03/24/2022 CLINICAL DATA:  Abdominal pain, acute, nonlocalized EXAM: CT ABDOMEN AND PELVIS WITH CONTRAST TECHNIQUE: Multidetector CT imaging of the abdomen and pelvis was performed using the standard protocol following bolus administration of intravenous contrast.  RADIATION DOSE REDUCTION: This exam was performed according to the departmental dose-optimization program which includes automated exposure control, adjustment of the mA and/or kV according to patient size and/or use of iterative reconstruction technique. CONTRAST:  138m OMNIPAQUE IOHEXOL 350 MG/ML SOLN COMPARISON:  CT of the abdomen and pelvis 09/13/2021 FINDINGS: Lower chest: Heart size is normal. Coronary artery calcifications are present. Mild dependent atelectasis is worse right than left. No significant pleural or pericardial effusion is present. Hepatobiliary: Shrunken irregular liver is again noted with diffuse fatty infiltration. Edematous changes surround  the gallbladder with enhancement of mucosa. The common bile duct is within normal limits. Pancreas: Unremarkable. No pancreatic ductal dilatation or surrounding inflammatory changes. Spleen: Splenomegaly is stable. Spleen measures 20 cm cephalo caudad. Adrenals/Urinary Tract: The adrenal glands are within normal limits bilaterally. A simple cyst in the medial right kidney measures 2.7 cm. Recommend no follow-up imaging. No other significant renal lesions are present. No stone or mass lesion is present. No obstruction is present. Ureters are within normal limits bilaterally. The urinary bladder is unremarkable. Stomach/Bowel: Stomach and duodenum are within normal limits. Small bowel is unremarkable. There is some stranding in the small bowel mesentery. Terminal ileum is normal. The appendix is visualized and within normal limits. The ascending and transverse colon are within normal limits. Descending proximal sigmoid colon is within normal limits. There is some wall thickening in the distal rectosigmoid colon without a discrete mass. Vascular/Lymphatic: Atherosclerotic changes in the aorta branch vessels are stable. Subcentimeter periaortic nodes are stable. 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 Collaterals noted into the pelvis. Reproductive: Prostate is  unremarkable. Other: No abdominal wall hernia or abnormality. No abdominopelvic ascites. Musculoskeletal: Grade 1 anterolisthesis and degenerative endplate changes are noted at L4-5. Inferior endplate Schmorl's node T9 is stable. No new lesions are present. Bony pelvis is within normal limits. The hips are located and normal. IMPRESSION: 1. Wall thickening in the distal rectosigmoid colon without a discrete mass lesion. This may represent a nonspecific colitis. 2. Inflammatory changes surround the gallbladder concerning for acute cholecystitis. Right upper quadrant ultrasound may be useful for further evaluation. 3. Stable changes of cirrhosis and portal hypertension. 4. Coronary artery disease. 5. Aortic Atherosclerosis (ICD10-I70.0). Electronically Signed   By: San Morelle M.D.   On: 03/24/2022 14:23    Scheduled Meds:  enoxaparin (LOVENOX) injection  40 mg Subcutaneous D74J   folic acid  1 mg Oral Daily   insulin aspart  0-9 Units Subcutaneous TID WC   multivitamin with minerals  1 tablet Oral Daily   nicotine  7 mg Transdermal Daily   sodium chloride flush  3 mL Intravenous Q12H   thiamine  100 mg Oral Daily   Or   thiamine  100 mg Intravenous Daily   Continuous Infusions:  ceFEPime (MAXIPIME) IV 2 g (03/26/22 0532)   metronidazole 500 mg (03/26/22 0531)     LOS: 2 days   Time spent: 63mn  Larene Ascencio C Harlyn Rathmann, DO [facility]  If 7PM-7AM, please contact night-coverage [email or web address]  03/26/2022, 8:18 AM

## 2022-03-27 DIAGNOSIS — A419 Sepsis, unspecified organism: Secondary | ICD-10-CM | POA: Diagnosis not present

## 2022-03-27 DIAGNOSIS — R6520 Severe sepsis without septic shock: Secondary | ICD-10-CM | POA: Diagnosis not present

## 2022-03-27 LAB — COMPREHENSIVE METABOLIC PANEL
ALT: 14 U/L (ref 0–44)
AST: 23 U/L (ref 15–41)
Albumin: 2.7 g/dL — ABNORMAL LOW (ref 3.5–5.0)
Alkaline Phosphatase: 83 U/L (ref 38–126)
Anion gap: 7 (ref 5–15)
BUN: 6 mg/dL — ABNORMAL LOW (ref 8–23)
CO2: 21 mmol/L — ABNORMAL LOW (ref 22–32)
Calcium: 8.1 mg/dL — ABNORMAL LOW (ref 8.9–10.3)
Chloride: 106 mmol/L (ref 98–111)
Creatinine, Ser: 0.68 mg/dL (ref 0.61–1.24)
GFR, Estimated: >60 mL/min (ref >60)
Glucose, Bld: 130 mg/dL — ABNORMAL HIGH (ref 70–99)
Potassium: 3.6 mmol/L (ref 3.5–5.1)
Sodium: 134 mmol/L — ABNORMAL LOW (ref 135–145)
Total Bilirubin: 1.6 mg/dL — ABNORMAL HIGH (ref 0.3–1.2)
Total Protein: 5.4 g/dL — ABNORMAL LOW (ref 6.5–8.1)

## 2022-03-27 LAB — CBC
HCT: 30.4 % — ABNORMAL LOW (ref 39.0–52.0)
Hemoglobin: 10.7 g/dL — ABNORMAL LOW (ref 13.0–17.0)
MCH: 33.2 pg (ref 26.0–34.0)
MCHC: 35.2 g/dL (ref 30.0–36.0)
MCV: 94.4 fL (ref 80.0–100.0)
Platelets: 58 10*3/uL — ABNORMAL LOW (ref 150–400)
RBC: 3.22 MIL/uL — ABNORMAL LOW (ref 4.22–5.81)
RDW: 13.2 % (ref 11.5–15.5)
WBC: 4 10*3/uL (ref 4.0–10.5)
nRBC: 0 % (ref 0.0–0.2)

## 2022-03-27 LAB — MAGNESIUM: Magnesium: 1.5 mg/dL — ABNORMAL LOW (ref 1.7–2.4)

## 2022-03-27 LAB — GLUCOSE, CAPILLARY
Glucose-Capillary: 128 mg/dL — ABNORMAL HIGH (ref 70–99)
Glucose-Capillary: 120 mg/dL — ABNORMAL HIGH (ref 70–99)
Glucose-Capillary: 120 mg/dL — ABNORMAL HIGH (ref 70–99)
Glucose-Capillary: 138 mg/dL — ABNORMAL HIGH (ref 70–99)

## 2022-03-27 LAB — PHOSPHORUS: Phosphorus: 3.2 mg/dL (ref 2.5–4.6)

## 2022-03-27 MED ORDER — TRAZODONE HCL 50 MG PO TABS
50.0000 mg | ORAL_TABLET | Freq: Once | ORAL | Status: AC
  Administered 2022-03-27: 50 mg via ORAL
  Filled 2022-03-27: qty 1

## 2022-03-27 MED ORDER — MELATONIN 3 MG PO TABS
3.0000 mg | ORAL_TABLET | Freq: Once | ORAL | Status: AC
  Administered 2022-03-27: 3 mg via ORAL
  Filled 2022-03-27: qty 1

## 2022-03-27 MED ORDER — CIPROFLOXACIN HCL 500 MG PO TABS
500.0000 mg | ORAL_TABLET | Freq: Two times a day (BID) | ORAL | Status: DC
  Administered 2022-03-27 – 2022-03-29 (×4): 500 mg via ORAL
  Filled 2022-03-27 (×4): qty 1

## 2022-03-27 NOTE — Progress Notes (Signed)
Pharmacy Antibiotic Note  Cory Palmer is a 61 y.o. male admitted on 03/23/2022 with gastroenteritis.  Pharmacy has been consulted for Cefepime dosing.  Plan: Continue Cefepime 2 g IV q 8 hrs Continue metronidazole 500 mg IV q 12 hrs This patient's current antibiotics will be continued without adjustments. Per MD, GI panel detected enteropathic ecoli (8/25) may switch to Cipro   Weight: 109.5 kg (241 lb 6.5 oz)  Temp (24hrs), Avg:98.2 F (36.8 C), Min:97.8 F (36.6 C), Max:98.6 F (37 C)  Recent Labs  Lab 03/24/22 0015 03/24/22 0233 03/24/22 0400 03/24/22 0729 03/24/22 1124 03/24/22 1833 03/25/22 0058 03/26/22 0139 03/27/22 0055  WBC 22.5*  --   --   --   --   --  5.6 5.0 4.0  CREATININE 0.82  --   --   --   --   --  0.79 0.67 0.68  LATICACIDVEN  --  5.2* 4.4* 5.0* 3.3* 1.7  --   --   --     Estimated Creatinine Clearance: 116.3 mL/min (by C-G formula based on SCr of 0.68 mg/dL).    No Known Allergies  Antimicrobials this admission: 03/24/22 Cefepime >>  03/24/22 Metronidazole >>   Dose adjustments this admission: No dose adjustments  Microbiology results: 03/24/22 BCx: NGTD x 3d 03/25/22 GI Panel: detected enteropathic ecoli  Alesia Banda Clinical Pharmacy Intern

## 2022-03-27 NOTE — Progress Notes (Addendum)
PROGRESS NOTE    Cory Palmer  IPJ:[id number] DOB: January 08, 1961 DOA: 03/23/2022 PCP: Hayden Rasmussen, MD   Brief Narrative:  Cory Palmer is a 61 y.o. male with medical history significant of hypertension, PAF, DM type II, liver cirrhosis, bipolar disorder, osteomyelitis, and s/p bilateral BKA who presented with complaints of feeling unwell and generally with a plethora of complaints including dry eyes, nonproductive cough, generalized abdominal discomfort unrelated to food or bowel movement, notes he has been eating quite poorly over the past 2 weeks since being evicted from his housing/shelter.  Given patient's diffuse complaints he was worked up for possible infection, he does not meet sepsis criteria given no overt source but was admitted for further evaluation and treatment given electrolyte abnormalities, symptoms and need for further imaging, evaluation.  Surgery was consulted initially given questionable CT findings as below, right quadrant ultrasound reassuring.  Assessment & Plan:   Principal Problem:   Severe sepsis ([place]) Active Problems:   Abdominal pain   Nausea vomiting and diarrhea   PAF (paroxysmal atrial fibrillation) (HCC)   Prolonged QT interval   Metabolic acidosis   Macrocytosis without anemia   Diabetes mellitus type II, non insulin dependent (HCC)   Cirrhosis of liver (HCC)   Homelessness   Depressed bipolar disorder (HCC)   Hyperbilirubinemia   Hypomagnesemia   Sepsis (HCC)  Abdominal pain, nausea, vomiting, diarrhea, sepsis ruled out Rule out infectious process/gastroenteritis Lactic acidosis - Subacute over 2+ weeks coinciding with eviction from shelter - Diffuse watery bowel movements continue overnight with improving amount and frequency - GI panel remarkable for enteropathic Ecoli --> transition to p.o. fluoroquinolone with Cipro per IDSA guidelines - CT questionable for nonspecific colitis, questionable cholecystitis however right upper quadrant  ultrasound reassuring.  No indication for HIDA scan at this point - General surgery to sign off which is certainly reasonable given nonsurgical at this point - Discontinue vancomycin, Flagyl, cefepime --> Cipro as above - Procalcitonin minimally elevated at 2 - Tolerating diet well -low threshold to decrease regimen as indicated   Failure to thrive, hypovolemia Profound electrolyte disturbances -Secondary to above poor p.o. intake -reports poor access to food over the last 2 weeks given homeless status -Continue to supplement potassium magnesium phosphorus, multivitamin, B12 and folate given current living situation and poor p.o. intake -Likely high risk for refeeding syndrome  Paroxysmal atrial fibrillation -CHA2DS2-VASc 2 -Not currently on any rate control or anticoagulation due to living situation poor follow-up and poor medical compliance -He indicates he does follow with a clinic but has not filled his prescriptions in "a while"   Prolonged QT interval -Avoid QT prolonging medications -Likely complicated by malnutrition and electrolyte disturbances   Metabolic acidosis with increased anion gap Secondary to lactic acidosis, resolved   Macrocytosis without anemia Continue vitamin supplementation, increase p.o. intake as appropriate B12, TSH, ESR within normal limits   Diabetes mellitus type 2, non insulin-dependent -Previously controlled - follow labs here -Hypoglycemic protocols -Clear liquid diet for now -CBGs before every meal with sensitive SSI   History of liver cirrhosis Chronic thrombocytopenia Previously thought possibly from history of alcohol use.  Noted to have thrombocytopenia.    -Ammonia minimally elevated at 56; lipase within normal limits   Hyperbilirubinemia -Chronic -Imaging remarkable for chronic cirrhotic changes and portal hypertension -Questionable gallbladder abnormalities on CT, ultrasound reassuring   Alcohol use, rare -Reports rare usage,  continues on CIWA protocol -On thiamine, MVI, folate    Bipolar disorder Not currently on any  medications for treatment   Tobacco use Patient reports smoking a few cigarettes   Chronic ambulatory dysfunction  -Status post bilateral BKA, wheelchair-bound -Plan to follow-up with orthopedic surgery for prosthetic fitting in the next few weeks  Noncompliance, profound  -Likely exacerbated by his homeless status, reports he does follow with a clinic "on occasion" but does not appear to fill any prescriptions regularly per our discussion  Homelessness -Transitions of care consulted -We discussed possible disposition to shelter, he is agreeable to discharge outside the city if necessary to obtain safe living facility and support   DVT prophylaxis: Lovenox Code Status: Full Family Communication: None present  Status is: Inpatient  Dispo: The patient is from: Homeless              Anticipated d/c is to: To be determined              Anticipated d/c date is: 40 to 72 hours              Patient currently not medically stable for discharge  Consultants:  GI  Procedures:  None  Antimicrobials:  Vancomycin discontinued 03/25/2022 Cefepime, Flagyl discontinued 03/27/2022 Cipro x5 days - stop date 03/31/22  Subjective: Watery bowel movements ongoing but decreasing in amount and frequency.  Objective: Vitals:   03/26/22 1553 03/26/22 2023 03/27/22 0500 03/27/22 0547  BP: 130/77 121/74  134/84  Pulse: 78 74  67  Resp: 16 17  18   Temp:  98.6 F (37 C)    TempSrc:      SpO2: 97% 97%  97%  Weight:   109.5 kg     Intake/Output Summary (Last 24 hours) at 03/27/2022 0803 Last data filed at 03/27/2022 0548 Gross per 24 hour  Intake 100 ml  Output 2600 ml  Net -2500 ml    Filed Weights   03/25/22 0500 03/26/22 0500 03/27/22 0500  Weight: 116.7 kg 112.7 kg 109.5 kg    Examination:  General:  Pleasantly resting in bed, No acute distress. HEENT:  Normocephalic atraumatic.   Sclerae nonicteric, noninjected.  Extraocular movements intact bilaterally. Neck:  Without mass or deformity.  Trachea is midline. Lungs:  Clear to auscultate bilaterally without rhonchi, wheeze, or rales. Heart:  Regular rate and rhythm.  Without murmurs, rubs, or gallops. Abdomen:  Soft, nontender, nondistended.  Without guarding or rebound. Extremities: Without cyanosis, clubbing, edema, bilateral BKA Skin:  Warm and dry, no erythema  Data Reviewed: I have personally reviewed following labs and imaging studies  CBC: Recent Labs  Lab 03/24/22 0015 03/25/22 0058 03/26/22 0139 03/27/22 0055  WBC 22.5* 5.6 5.0 4.0  HGB 13.2 10.7* 10.6* 10.7*  HCT 40.9 30.0* 29.1* 30.4*  MCV 104.3* 95.5 93.3 94.4  PLT 102* 52* 54* 58*    Basic Metabolic Panel: Recent Labs  Lab 03/24/22 0015 03/24/22 0942 03/25/22 0058 03/26/22 0139 03/27/22 0055  NA 138  --  132* 133* 134*  K 3.8  --  3.3* 3.4* 3.6  CL 111  --  105 106 106  CO2 10*  --  23 21* 21*  GLUCOSE 171*  --  156* 133* 130*  BUN 9  --  8 5* 6*  CREATININE 0.82  --  0.79 0.67 0.68  CALCIUM 8.3*  --  7.8* 7.9* 8.1*  MG  --  1.0* 1.5* 1.5* 1.5*  PHOS  --  1.6* 2.6 2.9 3.2    GFR: Estimated Creatinine Clearance: 116.3 mL/min (by C-G formula based on SCr of 0.68  mg/dL). Liver Function Tests: Recent Labs  Lab 03/24/22 0400 03/25/22 0058 03/26/22 0139 03/27/22 0055  AST 29 28 28 23   ALT 16 15 16 14   ALKPHOS 75 63 82 83  BILITOT 2.3* 1.5* 1.3* 1.6*  PROT 5.8* 4.9* 5.1* 5.4*  ALBUMIN 3.1* 2.6* 2.7* 2.7*    Recent Labs  Lab 03/24/22 1330  LIPASE 29    Recent Labs  Lab 03/24/22 1833  AMMONIA 56*    Coagulation Profile: Recent Labs  Lab 03/25/22 0058  INR 1.6*    Cardiac Enzymes: No results for input(s): "CKTOTAL", "CKMB", "CKMBINDEX", "TROPONINI" in the last 168 hours. BNP (last 3 results) No results for input(s): "PROBNP" in the last 8760 hours. HbA1C: No results for input(s): "HGBA1C" in the last 72  hours. CBG: Recent Labs  Lab 03/25/22 2119 03/26/22 0741 03/26/22 1237 03/26/22 1555 03/26/22 2024  GLUCAP 149* 124* 132* 104* 132*    Lipid Profile: No results for input(s): "CHOL", "HDL", "LDLCALC", "TRIG", "CHOLHDL", "LDLDIRECT" in the last 72 hours. Thyroid Function Tests: Recent Labs    03/24/22 0942  TSH 0.628    Anemia Panel: Recent Labs    03/24/22 0942  VITAMINB12 361    Sepsis Labs: Recent Labs  Lab 03/24/22 0400 03/24/22 0729 03/24/22 0942 03/24/22 1124 03/24/22 1833  PROCALCITON  --   --  2.13  --   --   LATICACIDVEN 4.4* 5.0*  --  3.3* 1.7     Recent Results (from the past 240 hour(s))  SARS Coronavirus 2 by RT PCR (hospital order, performed in [facility] hospital lab) *cepheid single result test* Anterior Nasal Swab     Status: None   Collection Time: 03/24/22  3:59 AM   Specimen: Anterior Nasal Swab  Result Value Ref Range Status   SARS Coronavirus 2 by RT PCR NEGATIVE NEGATIVE Final    Comment: (NOTE) SARS-CoV-2 target nucleic acids are NOT DETECTED.  The SARS-CoV-2 RNA is generally detectable in upper and lower respiratory specimens during the acute phase of infection. The lowest concentration of SARS-CoV-2 viral copies this assay can detect is 250 copies / mL. A negative result does not preclude SARS-CoV-2 infection and should not be used as the sole basis for treatment or other patient management decisions.  A negative result may occur with improper specimen collection / handling, submission of specimen other than nasopharyngeal swab, presence of viral mutation(s) within the areas targeted by this assay, and inadequate number of viral copies (<250 copies / mL). A negative result must be combined with clinical observations, patient history, and epidemiological information.  Fact Sheet for Patients:   https://www.patel.info/  Fact Sheet for Healthcare Providers: https://hall.com/  This test  is not yet approved or  cleared by the [place] FDA and has been authorized for detection and/or diagnosis of SARS-CoV-2 by FDA under an Emergency Use Authorization (EUA).  This EUA will remain in effect (meaning this test can be used) for the duration of the COVID-19 declaration under Section 564(b)(1) of the Act, 21 U.S.C. section 360bbb-3(b)(1), unless the authorization is terminated or revoked sooner.  Performed at [facility], [place] [address] [place],  [address]   Blood culture (routine x 2)     Status: None (Preliminary result)   Collection Time: 03/24/22  4:10 AM   Specimen: BLOOD RIGHT WRIST  Result Value Ref Range Status   Specimen Description BLOOD RIGHT WRIST  Final   Special Requests   Final    BOTTLES DRAWN AEROBIC  AND ANAEROBIC Blood Culture adequate volume   Culture   Final    NO GROWTH 2 DAYS Performed at [facility], [place] [address] [place], [place] [address]    Report Status PENDING  Incomplete  Blood culture (routine x 2)     Status: None (Preliminary result)   Collection Time: 03/24/22  4:15 AM   Specimen: BLOOD LEFT FOREARM  Result Value Ref Range Status   Specimen Description BLOOD LEFT FOREARM  Final   Special Requests   Final    BOTTLES DRAWN AEROBIC AND ANAEROBIC Blood Culture results may not be optimal due to an excessive volume of blood received in culture bottles   Culture   Final    NO GROWTH 2 DAYS Performed at [facility], [place] [address] [place], [place] [address]    Report Status PENDING  Incomplete  MRSA Next Gen by PCR, Nasal     Status: Abnormal   Collection Time: 03/25/22 10:17 AM   Specimen: Nasal Mucosa; Nasal Swab  Result Value Ref Range Status   MRSA by PCR Next Gen DETECTED (A) NOT DETECTED Final    Comment: RESULT CALLED TO, READ BACK BY AND VERIFIED WITH: L. YOUNG RN, AT 1418 03/25/22 D. VANHOOK (NOTE) The GeneXpert MRSA Assay (FDA approved for NASAL specimens only), is one component of a  comprehensive MRSA colonization surveillance program. It is not intended to diagnose MRSA infection nor to guide or monitor treatment for MRSA infections. Test performance is not FDA approved in patients less than 65 years old. Performed at [facility], [place] [address] [place], [place] [address]   Gastrointestinal Panel by PCR , Stool     Status: Abnormal   Collection Time: 03/25/22  9:29 PM   Specimen: Stool  Result Value Ref Range Status   Campylobacter species NOT DETECTED NOT DETECTED Final   Plesimonas shigelloides NOT DETECTED NOT DETECTED Final   Salmonella species NOT DETECTED NOT DETECTED Final   Yersinia enterocolitica NOT DETECTED NOT DETECTED Final   Vibrio species NOT DETECTED NOT DETECTED Final   Vibrio cholerae NOT DETECTED NOT DETECTED Final   Enteroaggregative E coli (EAEC) NOT DETECTED NOT DETECTED Final   Enteropathogenic E coli (EPEC) DETECTED (A) NOT DETECTED Final    Comment: RESULT CALLED TO, READ BACK BY AND VERIFIED WITH: LAURA HART 03/26/22 1638 SLM    Enterotoxigenic E coli (ETEC) NOT DETECTED NOT DETECTED Final   Shiga like toxin producing E coli (STEC) NOT DETECTED NOT DETECTED Final   Shigella/Enteroinvasive E coli (EIEC) NOT DETECTED NOT DETECTED Final   Cryptosporidium NOT DETECTED NOT DETECTED Final   Cyclospora cayetanensis NOT DETECTED NOT DETECTED Final   Entamoeba histolytica NOT DETECTED NOT DETECTED Final   Giardia lamblia NOT DETECTED NOT DETECTED Final   Adenovirus F40/41 NOT DETECTED NOT DETECTED Final   Astrovirus NOT DETECTED NOT DETECTED Final   Norovirus GI/GII NOT DETECTED NOT DETECTED Final   Rotavirus A NOT DETECTED NOT DETECTED Final   Sapovirus (I, II, IV, and V) NOT DETECTED NOT DETECTED Final    Comment: Performed at [facility], [address] [place], [place] [address]         Radiology Studies: No results found.  Scheduled Meds:  enoxaparin (LOVENOX) injection  40 mg Subcutaneous H70Y   folic acid   1 mg Oral Daily   insulin aspart  0-9 Units Subcutaneous TID WC   multivitamin with minerals  1 tablet Oral Daily  nicotine  7 mg Transdermal Daily   sodium chloride flush  3 mL Intravenous Q12H   thiamine  100 mg Oral Daily   Or   thiamine  100 mg Intravenous Daily   Continuous Infusions:  ceFEPime (MAXIPIME) IV 2 g (03/27/22 0601)   metronidazole 500 mg (03/27/22 0600)     LOS: 3 days   Time spent: 73mn  Alliah Boulanger C Cecil Bixby, DO [facility]  If 7PM-7AM, please contact night-coverage [email or web address]  03/27/2022, 8:03 AM

## 2022-03-28 DIAGNOSIS — R6520 Severe sepsis without septic shock: Secondary | ICD-10-CM | POA: Diagnosis not present

## 2022-03-28 DIAGNOSIS — A419 Sepsis, unspecified organism: Secondary | ICD-10-CM | POA: Diagnosis not present

## 2022-03-28 LAB — CBC
HCT: 32.1 % — ABNORMAL LOW (ref 39.0–52.0)
Hemoglobin: 11.3 g/dL — ABNORMAL LOW (ref 13.0–17.0)
MCH: 33.4 pg (ref 26.0–34.0)
MCHC: 35.2 g/dL (ref 30.0–36.0)
MCV: 95 fL (ref 80.0–100.0)
Platelets: 77 10*3/uL — ABNORMAL LOW (ref 150–400)
RBC: 3.38 MIL/uL — ABNORMAL LOW (ref 4.22–5.81)
RDW: 13.1 % (ref 11.5–15.5)
WBC: 4.9 10*3/uL (ref 4.0–10.5)
nRBC: 0 % (ref 0.0–0.2)

## 2022-03-28 LAB — COMPREHENSIVE METABOLIC PANEL
ALT: 14 U/L (ref 0–44)
AST: 25 U/L (ref 15–41)
Albumin: 2.8 g/dL — ABNORMAL LOW (ref 3.5–5.0)
Alkaline Phosphatase: 86 U/L (ref 38–126)
Anion gap: 6 (ref 5–15)
BUN: 8 mg/dL (ref 8–23)
CO2: 23 mmol/L (ref 22–32)
Calcium: 8.4 mg/dL — ABNORMAL LOW (ref 8.9–10.3)
Chloride: 108 mmol/L (ref 98–111)
Creatinine, Ser: 0.62 mg/dL (ref 0.61–1.24)
GFR, Estimated: >60 mL/min (ref >60)
Glucose, Bld: 109 mg/dL — ABNORMAL HIGH (ref 70–99)
Potassium: 3.8 mmol/L (ref 3.5–5.1)
Sodium: 137 mmol/L (ref 135–145)
Total Bilirubin: 1.2 mg/dL (ref 0.3–1.2)
Total Protein: 5.4 g/dL — ABNORMAL LOW (ref 6.5–8.1)

## 2022-03-28 LAB — PHOSPHORUS: Phosphorus: 4.1 mg/dL (ref 2.5–4.6)

## 2022-03-28 LAB — GLUCOSE, CAPILLARY
Glucose-Capillary: 137 mg/dL — ABNORMAL HIGH (ref 70–99)
Glucose-Capillary: 225 mg/dL — ABNORMAL HIGH (ref 70–99)
Glucose-Capillary: 140 mg/dL — ABNORMAL HIGH (ref 70–99)
Glucose-Capillary: 201 mg/dL — ABNORMAL HIGH (ref 70–99)

## 2022-03-28 LAB — MAGNESIUM: Magnesium: 1.5 mg/dL — ABNORMAL LOW (ref 1.7–2.4)

## 2022-03-28 MED ORDER — CHLORHEXIDINE GLUCONATE CLOTH 2 % EX PADS: 6.0000 | MEDICATED_PAD | Freq: Every day | CUTANEOUS | Status: DC

## 2022-03-28 MED ORDER — SALINE SPRAY 0.65 % NA SOLN: 1.0000 | NASAL | Status: DC | PRN

## 2022-03-28 MED ORDER — MUPIROCIN 2 % EX OINT
1.0000 | TOPICAL_OINTMENT | Freq: Two times a day (BID) | CUTANEOUS | Status: DC
  Administered 2022-03-28 – 2022-03-29 (×3): 1 via NASAL
  Filled 2022-03-28: qty 22

## 2022-03-28 MED ORDER — KETOTIFEN FUMARATE 0.025 % OP SOLN
2.0000 [drp] | Freq: Two times a day (BID) | OPHTHALMIC | Status: DC
  Administered 2022-03-28 – 2022-03-29 (×3): 2 [drp] via OPHTHALMIC
  Filled 2022-03-28: qty 5

## 2022-03-28 NOTE — Progress Notes (Signed)
PROGRESS NOTE    Cory Palmer  MRN:[id number] DOB: 10/23/1960 DOA: 03/23/2022 PCP: Richter, Karen L, MD   Brief Narrative:  Cory Palmer is a 61 y.o. male with medical history significant of hypertension, PAF, DM type II, liver cirrhosis, bipolar disorder, osteomyelitis, and s/p bilateral BKA who presented with complaints of feeling unwell and generally with a plethora of complaints including dry eyes, nonproductive cough, generalized abdominal discomfort unrelated to food or bowel movement, notes he has been eating quite poorly over the past 2 weeks since being evicted from his housing/shelter.  Given patient's diffuse complaints he was worked up for possible infection, he does not meet sepsis criteria given no overt source but was admitted for further evaluation and treatment given electrolyte abnormalities, symptoms and need for further imaging, evaluation.  Surgery was consulted initially given questionable CT findings as below, right quadrant ultrasound reassuring.  Assessment & Plan:   Principal Problem:   Severe sepsis (HCC) Active Problems:   Abdominal pain   Nausea vomiting and diarrhea   PAF (paroxysmal atrial fibrillation) (HCC)   Prolonged QT interval   Metabolic acidosis   Macrocytosis without anemia   Diabetes mellitus type II, non insulin dependent (HCC)   Cirrhosis of liver (HCC)   Homelessness   Depressed bipolar disorder (HCC)   Hyperbilirubinemia   Hypomagnesemia   Sepsis (HCC)  Abdominal pain, nausea, vomiting, diarrhea, sepsis ruled out Rule out infectious process/gastroenteritis Lactic acidosis - Subacute over 2+ weeks coinciding with eviction from shelter - Diffuse watery bowel movements minimally improving overnight - GI panel remarkable for enteropathic Ecoli --> transition to p.o. fluoroquinolone with Cipro per IDSA guidelines - CT questionable for nonspecific colitis vs questionable cholecystitis however right upper quadrant ultrasound reassuring.   No indication for HIDA scan/intervention at this point per Gen Sx who signed off - Discontinue vancomycin, Flagyl, cefepime --> Cipro as above - Tolerating diet well  Failure to thrive, hypovolemia Profound electrolyte disturbances -Secondary to above poor p.o. intake -reports poor access to food over the last 2 weeks given homeless status -Continue to supplement potassium magnesium phosphorus, multivitamin, B12 and folate given current living situation and poor p.o. intake -Likely high risk for refeeding syndrome  Paroxysmal atrial fibrillation -CHA2DS2-VASc 2 -Not currently on any rate control or anticoagulation due to living situation poor follow-up and poor medical compliance -He indicates he does follow with a clinic but has not filled his prescriptions in "a while"   Prolonged QT interval -Avoid QT prolonging medications -Likely complicated by malnutrition and electrolyte disturbances   Metabolic acidosis with increased anion gap Secondary to lactic acidosis, resolved   Macrocytosis without anemia Continue vitamin supplementation, increase p.o. intake as appropriate B12, TSH, ESR within normal limits   Diabetes mellitus type 2, non insulin-dependent, well controlled -Previously controlled - follow labs here -Hypoglycemic protocols -Advance diet as tolerated -CBGs before every meal with sensitive SSI   History of liver cirrhosis Chronic thrombocytopenia Previously thought possibly from history of alcohol use.  Noted to have thrombocytopenia.    -Ammonia minimally elevated at 56; lipase within normal limits   Hyperbilirubinemia -Chronic -Imaging remarkable for chronic cirrhotic changes and portal hypertension -Questionable gallbladder abnormalities on CT, ultrasound reassuring   Alcohol use, rare -Reports rare usage, continues on CIWA protocol -On thiamine, MVI, folate    Bipolar disorder Not currently on any medications for treatment   Tobacco use Patient  reports smoking a few cigarettes   Chronic ambulatory dysfunction  -Status post bilateral BKA, wheelchair-bound -Plan   to follow-up with orthopedic surgery for prosthetic fitting 03/29/22 as scheduled  Noncompliance, profound  -Likely exacerbated by his homeless status, reports he does follow with a clinic "on occasion" but does not appear to fill any prescriptions regularly per our discussion  Homelessness -Transitions of care consulted -We discussed possible disposition to shelter, he is agreeable to discharge outside the city if necessary to obtain safe living facility and support   DVT prophylaxis: Lovenox Code Status: Full Family Communication: None present  Status is: Inpatient  Dispo: The patient is from: Homeless              Anticipated d/c is to: To be determined              Anticipated d/c date is: 24 hours              Patient currently not medically stable for discharge  Consultants:  GI  Procedures:  None  Antimicrobials:  Vancomycin discontinued 03/25/2022 Cefepime, Flagyl discontinued 03/27/2022 Cipro x5 days - stop date 03/31/22  Subjective: Watery bowel movements ongoing but decreasing in amount and frequency.  Objective: Vitals:   03/27/22 1551 03/27/22 2057 03/28/22 0500 03/28/22 0531  BP: (!) 138/91 119/72  102/61  Pulse: 87 63  68  Resp: 18 17  17  Temp: 97.8 F (36.6 C) 97.6 F (36.4 C)  98.8 F (37.1 C)  TempSrc:  Oral  Oral  SpO2: 99% 97%  94%  Weight:   106 kg     Intake/Output Summary (Last 24 hours) at 03/28/2022 0743 Last data filed at 03/28/2022 0532 Gross per 24 hour  Intake 460 ml  Output 4105 ml  Net -3645 ml    Filed Weights   03/26/22 0500 03/27/22 0500 03/28/22 0500  Weight: 112.7 kg 109.5 kg 106 kg    Examination:  General:  Pleasantly resting in bed, No acute distress. HEENT:  Normocephalic atraumatic.  Sclerae nonicteric, noninjected.  Extraocular movements intact bilaterally. Neck:  Without mass or deformity.   Trachea is midline. Lungs:  Clear to auscultate bilaterally without rhonchi, wheeze, or rales. Heart:  Regular rate and rhythm.  Without murmurs, rubs, or gallops. Abdomen:  Soft, nontender, nondistended.  Without guarding or rebound. Extremities: Without cyanosis, clubbing, edema, bilateral BKA Skin:  Warm and dry, no erythema  Data Reviewed: I have personally reviewed following labs and imaging studies  CBC: Recent Labs  Lab 03/24/22 0015 03/25/22 0058 03/26/22 0139 03/27/22 0055 03/28/22 0147  WBC 22.5* 5.6 5.0 4.0 4.9  HGB 13.2 10.7* 10.6* 10.7* 11.3*  HCT 40.9 30.0* 29.1* 30.4* 32.1*  MCV 104.3* 95.5 93.3 94.4 95.0  PLT 102* 52* 54* 58* 77*    Basic Metabolic Panel: Recent Labs  Lab 03/24/22 0015 03/24/22 0942 03/25/22 0058 03/26/22 0139 03/27/22 0055 03/28/22 0147  NA 138  --  132* 133* 134* 137  K 3.8  --  3.3* 3.4* 3.6 3.8  CL 111  --  105 106 106 108  CO2 10*  --  23 21* 21* 23  GLUCOSE 171*  --  156* 133* 130* 109*  BUN 9  --  8 5* 6* 8  CREATININE 0.82  --  0.79 0.67 0.68 0.62  CALCIUM 8.3*  --  7.8* 7.9* 8.1* 8.4*  MG  --  1.0* 1.5* 1.5* 1.5* 1.5*  PHOS  --  1.6* 2.6 2.9 3.2 4.1    GFR: Estimated Creatinine Clearance: 114.4 mL/min (by C-G formula based on SCr of 0.62 mg/dL). Liver Function   Tests: Recent Labs  Lab 03/24/22 0400 03/25/22 0058 03/26/22 0139 03/27/22 0055 03/28/22 0147  AST 29 28 28 23 25  ALT 16 15 16 14 14  ALKPHOS 75 63 82 83 86  BILITOT 2.3* 1.5* 1.3* 1.6* 1.2  PROT 5.8* 4.9* 5.1* 5.4* 5.4*  ALBUMIN 3.1* 2.6* 2.7* 2.7* 2.8*    Recent Labs  Lab 03/24/22 1330  LIPASE 29    Recent Labs  Lab 03/24/22 1833  AMMONIA 56*    Coagulation Profile: Recent Labs  Lab 03/25/22 0058  INR 1.6*    Cardiac Enzymes: No results for input(s): "CKTOTAL", "CKMB", "CKMBINDEX", "TROPONINI" in the last 168 hours. BNP (last 3 results) No results for input(s): "PROBNP" in the last 8760 hours. HbA1C: No results for input(s):  "HGBA1C" in the last 72 hours. CBG: Recent Labs  Lab 03/26/22 2024 03/27/22 0824 03/27/22 1140 03/27/22 1549 03/27/22 2055  GLUCAP 132* 128* 120* 120* 138*    Lipid Profile: No results for input(s): "CHOL", "HDL", "LDLCALC", "TRIG", "CHOLHDL", "LDLDIRECT" in the last 72 hours. Thyroid Function Tests: No results for input(s): "TSH", "T4TOTAL", "FREET4", "T3FREE", "THYROIDAB" in the last 72 hours.  Anemia Panel: No results for input(s): "VITAMINB12", "FOLATE", "FERRITIN", "TIBC", "IRON", "RETICCTPCT" in the last 72 hours.  Sepsis Labs: Recent Labs  Lab 03/24/22 0400 03/24/22 0729 03/24/22 0942 03/24/22 1124 03/24/22 1833  PROCALCITON  --   --  2.13  --   --   LATICACIDVEN 4.4* 5.0*  --  3.3* 1.7     Recent Results (from the past 240 hour(s))  SARS Coronavirus 2 by RT PCR (hospital order, performed in [place] hospital lab) *cepheid single result test* Anterior Nasal Swab     Status: None   Collection Time: 03/24/22  3:59 AM   Specimen: Anterior Nasal Swab  Result Value Ref Range Status   SARS Coronavirus 2 by RT PCR NEGATIVE NEGATIVE Final    Comment: (NOTE) SARS-CoV-2 target nucleic acids are NOT DETECTED.  The SARS-CoV-2 RNA is generally detectable in upper and lower respiratory specimens during the acute phase of infection. The lowest concentration of SARS-CoV-2 viral copies this assay can detect is 250 copies / mL. A negative result does not preclude SARS-CoV-2 infection and should not be used as the sole basis for treatment or other patient management decisions.  A negative result may occur with improper specimen collection / handling, submission of specimen other than nasopharyngeal swab, presence of viral mutation(s) within the areas targeted by this assay, and inadequate number of viral copies (<250 copies / mL). A negative result must be combined with clinical observations, patient history, and epidemiological information.  Fact Sheet for Patients:    https://www.fda.gov/media/158405/download  Fact Sheet for Healthcare Providers: https://www.fda.gov/media/158404/download  This test is not yet approved or  cleared by the [place] FDA and has been authorized for detection and/or diagnosis of SARS-CoV-2 by FDA under an Emergency Use Authorization (EUA).  This EUA will remain in effect (meaning this test can be used) for the duration of the COVID-19 declaration under Section 564(b)(1) of the Act, 21 U.S.C. section 360bbb-3(b)(1), unless the authorization is terminated or revoked sooner.  Performed at [facility], [address] [place], [place] [address]   Blood culture (routine x 2)     Status: None (Preliminary result)   Collection Time: 03/24/22  4:10 AM   Specimen: BLOOD RIGHT WRIST  Result Value Ref Range Status   Specimen Description BLOOD RIGHT WRIST  Final   Special   Requests   Final    BOTTLES DRAWN AEROBIC AND ANAEROBIC Blood Culture adequate volume   Culture   Final    NO GROWTH 3 DAYS Performed at [facility], [address] [place], [place] [address]    Report Status PENDING  Incomplete  Blood culture (routine x 2)     Status: None (Preliminary result)   Collection Time: 03/24/22  4:15 AM   Specimen: BLOOD LEFT FOREARM  Result Value Ref Range Status   Specimen Description BLOOD LEFT FOREARM  Final   Special Requests   Final    BOTTLES DRAWN AEROBIC AND ANAEROBIC Blood Culture results may not be optimal due to an excessive volume of blood received in culture bottles   Culture   Final    NO GROWTH 3 DAYS Performed at [facility], [address] [place], [place] [address]    Report Status PENDING  Incomplete  MRSA Next Gen by PCR, Nasal     Status: Abnormal   Collection Time: 03/25/22 10:17 AM   Specimen: Nasal Mucosa; Nasal Swab  Result Value Ref Range Status   MRSA by PCR Next Gen DETECTED (A) NOT DETECTED Final    Comment: RESULT CALLED TO, READ BACK BY AND VERIFIED WITH: L.  YOUNG RN, AT 1418 03/25/22 D. VANHOOK (NOTE) The GeneXpert MRSA Assay (FDA approved for NASAL specimens only), is one component of a comprehensive MRSA colonization surveillance program. It is not intended to diagnose MRSA infection nor to guide or monitor treatment for MRSA infections. Test performance is not FDA approved in patients less than 2 years old. Performed at [facility], [address] [place], [place] [address]   Gastrointestinal Panel by PCR , Stool     Status: Abnormal   Collection Time: 03/25/22  9:29 PM   Specimen: Stool  Result Value Ref Range Status   Campylobacter species NOT DETECTED NOT DETECTED Final   Plesimonas shigelloides NOT DETECTED NOT DETECTED Final   Salmonella species NOT DETECTED NOT DETECTED Final   Yersinia enterocolitica NOT DETECTED NOT DETECTED Final   Vibrio species NOT DETECTED NOT DETECTED Final   Vibrio cholerae NOT DETECTED NOT DETECTED Final   Enteroaggregative E coli (EAEC) NOT DETECTED NOT DETECTED Final   Enteropathogenic E coli (EPEC) DETECTED (A) NOT DETECTED Final    Comment: RESULT CALLED TO, READ BACK BY AND VERIFIED WITH: LAURA HART 03/26/22 1638 SLM    Enterotoxigenic E coli (ETEC) NOT DETECTED NOT DETECTED Final   Shiga like toxin producing E coli (STEC) NOT DETECTED NOT DETECTED Final   Shigella/Enteroinvasive E coli (EIEC) NOT DETECTED NOT DETECTED Final   Cryptosporidium NOT DETECTED NOT DETECTED Final   Cyclospora cayetanensis NOT DETECTED NOT DETECTED Final   Entamoeba histolytica NOT DETECTED NOT DETECTED Final   Giardia lamblia NOT DETECTED NOT DETECTED Final   Adenovirus F40/41 NOT DETECTED NOT DETECTED Final   Astrovirus NOT DETECTED NOT DETECTED Final   Norovirus GI/GII NOT DETECTED NOT DETECTED Final   Rotavirus A NOT DETECTED NOT DETECTED Final   Sapovirus (I, II, IV, and V) NOT DETECTED NOT DETECTED Final    Comment: Performed at [facility], [address] [place], [place] [address]          Radiology Studies: No results found.  Scheduled Meds:  ciprofloxacin  500 mg Oral BID   enoxaparin (LOVENOX) injection  40 mg Subcutaneous Q24H   folic acid  1 mg Oral Daily   insulin aspart    0-9 Units Subcutaneous TID WC   multivitamin with minerals  1 tablet Oral Daily   nicotine  7 mg Transdermal Daily   sodium chloride flush  3 mL Intravenous Q12H   thiamine  100 mg Oral Daily   Or   thiamine  100 mg Intravenous Daily   Continuous Infusions:     LOS: 4 days   Time spent: 57mn  Keddrick Wyne C Anilah Huck, DO [facility]  If 7PM-7AM, please contact night-coverage [email or web address]  03/28/2022, 7:43 AM

## 2022-03-29 ENCOUNTER — Other Ambulatory Visit (HOSPITAL_COMMUNITY): Payer: Self-pay

## 2022-03-29 ENCOUNTER — Inpatient Hospital Stay (INDEPENDENT_AMBULATORY_CARE_PROVIDER_SITE_OTHER): Payer: Medicare Other

## 2022-03-29 ENCOUNTER — Ambulatory Visit (INDEPENDENT_AMBULATORY_CARE_PROVIDER_SITE_OTHER): Payer: Medicare Other | Admitting: Orthopedic Surgery

## 2022-03-29 DIAGNOSIS — Z89512 Acquired absence of left leg below knee: Secondary | ICD-10-CM

## 2022-03-29 DIAGNOSIS — R6520 Severe sepsis without septic shock: Secondary | ICD-10-CM | POA: Diagnosis not present

## 2022-03-29 DIAGNOSIS — A419 Sepsis, unspecified organism: Secondary | ICD-10-CM | POA: Diagnosis not present

## 2022-03-29 DIAGNOSIS — S88112A Complete traumatic amputation at level between knee and ankle, left lower leg, initial encounter: Secondary | ICD-10-CM

## 2022-03-29 LAB — COMPREHENSIVE METABOLIC PANEL
ALT: 14 U/L (ref 0–44)
AST: 29 U/L (ref 15–41)
Albumin: 2.9 g/dL — ABNORMAL LOW (ref 3.5–5.0)
Alkaline Phosphatase: 94 U/L (ref 38–126)
Anion gap: 9 (ref 5–15)
BUN: 10 mg/dL (ref 8–23)
CO2: 21 mmol/L — ABNORMAL LOW (ref 22–32)
Calcium: 8.4 mg/dL — ABNORMAL LOW (ref 8.9–10.3)
Chloride: 104 mmol/L (ref 98–111)
Creatinine, Ser: 0.79 mg/dL (ref 0.61–1.24)
GFR, Estimated: >60 mL/min (ref >60)
Glucose, Bld: 134 mg/dL — ABNORMAL HIGH (ref 70–99)
Potassium: 3.9 mmol/L (ref 3.5–5.1)
Sodium: 134 mmol/L — ABNORMAL LOW (ref 135–145)
Total Bilirubin: 1.3 mg/dL — ABNORMAL HIGH (ref 0.3–1.2)
Total Protein: 5.8 g/dL — ABNORMAL LOW (ref 6.5–8.1)

## 2022-03-29 LAB — CBC
HCT: 35.5 % — ABNORMAL LOW (ref 39.0–52.0)
Hemoglobin: 12.2 g/dL — ABNORMAL LOW (ref 13.0–17.0)
MCH: 33.6 pg (ref 26.0–34.0)
MCHC: 34.4 g/dL (ref 30.0–36.0)
MCV: 97.8 fL (ref 80.0–100.0)
Platelets: 85 10*3/uL — ABNORMAL LOW (ref 150–400)
RBC: 3.63 MIL/uL — ABNORMAL LOW (ref 4.22–5.81)
RDW: 13.2 % (ref 11.5–15.5)
WBC: 5.8 10*3/uL (ref 4.0–10.5)
nRBC: 0 % (ref 0.0–0.2)

## 2022-03-29 LAB — CULTURE, BLOOD (ROUTINE X 2)
Culture: NO GROWTH
Culture: NO GROWTH
Special Requests: ADEQUATE

## 2022-03-29 LAB — GLUCOSE, CAPILLARY
Glucose-Capillary: 120 mg/dL — ABNORMAL HIGH (ref 70–99)
Glucose-Capillary: 107 mg/dL — ABNORMAL HIGH (ref 70–99)

## 2022-03-29 LAB — PHOSPHORUS: Phosphorus: 3.7 mg/dL (ref 2.5–4.6)

## 2022-03-29 LAB — MAGNESIUM: Magnesium: 1.4 mg/dL — ABNORMAL LOW (ref 1.7–2.4)

## 2022-03-29 MED ORDER — CIPROFLOXACIN HCL 500 MG PO TABS
500.0000 mg | ORAL_TABLET | Freq: Two times a day (BID) | ORAL | 0 refills | Status: DC
  Filled 2022-03-29: qty 14, 7d supply, fill #0

## 2022-03-29 MED ORDER — SERTRALINE HCL 50 MG PO TABS
50.0000 mg | ORAL_TABLET | Freq: Every day | ORAL | 0 refills | Status: DC
  Filled 2022-03-29: qty 30, 30d supply, fill #0

## 2022-03-29 MED ORDER — METFORMIN HCL 1000 MG PO TABS
1000.0000 mg | ORAL_TABLET | Freq: Two times a day (BID) | ORAL | 0 refills | Status: DC
  Filled 2022-03-29 – 2022-04-21 (×3): qty 60, 30d supply, fill #0

## 2022-03-29 NOTE — TOC Initial Note (Signed)
Transition of Care [facility]) - Initial/Assessment Note    Patient Details  Name: Cory Palmer MRN: [id number] Date of Birth: [birth year]  Transition of [facility]) CM/SW Contact:    Kingsley Plan, RN Phone Number: 03/29/2022, 10:45 AM  Clinical Narrative:                  Spoke to patient at bedside.   Prior to admission , patient states he was "banned from [facility]" and cannot return. He has no where to go at discharge. Provided list of shelters , patient make some calls prior to discharge today and has addresses.   NCM provided list of shelters and housing information, along with [employer].    Patient has an appointment with Dr [name] this afternoon. NCM called and confirmed address and patient needs to be there at 2:30 pm.   Patient has his wheel chair in hospital room . He uses public bus for transportation, however bus times do not coincide with appointment.    NCM called [facility] [employer] [phone] and arranged. All information provided. [facility] will call patient's nurse Amber [phone] closer to pick up time , so patient can be taken down to [facility] [employer].   MD will send prescriptions to [facility] , who will bring medications to patient prior to discharge.   Follow up appointment scheduled at [facility] April 01, 2022 at 0815 , information placed on AVS.  4 bus passes  placed on patient's chart, for nurse to give at discharge.   Patient aware and voiced understanding.  Nurse and MD aware.    Expected Discharge Plan: Homeless Shelter Barriers to Discharge: No Barriers Identified   Patient Goals and CMS Choice        Expected Discharge Plan and Services Expected Discharge Plan: Homeless Shelter In-house Referral: [facility] Discharge Planning Services: CM Consult, [facility]   Living arrangements for the past 2 months: Homeless Shelter Expected Discharge Date: 03/29/22               DME  Arranged: N/A         HH Arranged: NA          Prior Living Arrangements/Services Living arrangements for the past 2 months: Homeless Shelter Lives with:: Self Patient language and need for interpreter reviewed:: Yes            Current home services: DME (wheel chair) Criminal Activity/Legal Involvement Pertinent to Current Situation/Hospitalization: No - Comment as needed  Activities of Daily Living      Permission Sought/Granted   Permission granted to share information with : No              Emotional Assessment Appearance:: Appears stated age Attitude/Demeanor/Rapport: Engaged Affect (typically observed): Accepting Orientation: : Oriented to Self, Oriented to Place, Oriented to  Time, Oriented to Situation Alcohol / Substance Use: Not Applicable Psych Involvement: No (comment)  Admission diagnosis:  SIRS (systemic inflammatory response syndrome) (HCC) [R65.10] Sepsis (HCC) [A41.9] Severe sepsis (HCC) [A41.9, R65.20] Patient Active Problem List   Diagnosis Date Noted   Severe sepsis (HCC) 03/24/2022   Abdominal pain 03/24/2022   Nausea vomiting and diarrhea 03/24/2022   Prolonged QT interval 03/24/2022   Macrocytosis without anemia 03/24/2022   Sepsis (HCC) 03/24/2022   Acute hepatic encephalopathy (HCC) 11/17/2021   PVD (peripheral vascular disease) (HCC) 11/17/2021   Metabolic acidosis 11/17/2021   Esophagitis 11/17/2021   Acute metabolic encephalopathy 11/11/2021  Thrombocytopenia (HCC) 11/11/2021   Leukocytosis 10/10/2020   GERD (gastroesophageal reflux disease) 10/10/2020   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HCC) 05/06/2020   Hyponatremia 10/23/2019   MRSA bacteremia 10/21/2019   Atrial flutter with rapid ventricular response (HCC) 10/21/2019   Lactic acidosis 10/21/2019   Severe protein-calorie malnutrition (HCC)    Diabetic polyneuropathy associated with type 2 diabetes mellitus (HCC)    Cirrhosis of liver (HCC) 10/19/2019    Anemia 10/19/2019   Medication monitoring encounter 08/15/2019   Serum total bilirubin elevated 10/29/2018   BPH (benign prostatic hyperplasia) 10/29/2018   Below-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)    PAF (paroxysmal atrial fibrillation) (HCC)    Hypokalemia    Hypomagnesemia    Sepsis without acute organ dysfunction (HCC) 10/05/2018   Depressed bipolar disorder (HCC) 10/05/2018   Hypertension 10/05/2018   Hyperbilirubinemia 10/05/2018   Homelessness 05/07/2018   Diabetes mellitus type II, non insulin dependent (HCC) 05/07/2018   PCP:  Marcine Matar, MD Pharmacy:   [facility] [address] [place] [address] Phone: [phone] Fax: [phone]  [facility] [address] [place] [address] Phone: [phone] Fax: [phone]     Social Determinants of Health (SDOH) Interventions    Readmission Risk Interventions    11/18/2021    4:15 PM 10/21/2019    1:04 PM  Readmission Risk Prevention Plan  Transportation Screening Complete Complete  PCP or Specialist Appt within 5-7 Days  Complete  Home Care Screening  Complete  Medication Review (RN CM)  Complete  Medication Review (RN Care Manager) Complete   PCP or Specialist appointment within 3-5 days of discharge Complete   HRI or Home Care Consult Complete   SW Recovery Care/Counseling Consult Complete   Palliative Care Screening Not Applicable

## 2022-03-29 NOTE — Care Management Important Message (Signed)
Important Message  Patient Details  Name: Cory Palmer MRN: [id number] Date of Birth: September 27, 1960   Medicare Important Message Given:  Yes     Sherilyn Banker 03/29/2022, 11:54 AM

## 2022-03-29 NOTE — Discharge Summary (Signed)
Physician Discharge Summary  Cory Palmer ZOX:[id number] DOB: 10/13/1960 DOA: 03/23/2022  PCP: Ladell Pier, MD  Admit date: 03/23/2022 Discharge date: 03/29/2022  Admitted From: Home Disposition: Home  Recommendations for Outpatient Follow-up:  Follow up with PCP in 1-2 weeks Follow-up with orthopedic surgery later today per previously scheduled appointment  Home Health: None Equipment/Devices: None  Discharge Condition:Stable  CODE STATUS:Full  Diet recommendation: Diabetic diet    Brief/Interim Summary: Cory Palmer is a 61 y.o. male with medical history significant of hypertension, PAF, DM type II, liver cirrhosis, bipolar disorder, osteomyelitis, and s/p bilateral BKA who presented with complaints of feeling unwell and generally with a plethora of complaints including dry eyes, nonproductive cough, generalized abdominal discomfort unrelated to food or bowel movement, notes he has been eating quite poorly over the past 2 weeks since being evicted from his housing/shelter.  Patient and needed as above with unclear source for infection, general surgery was initially consulted due to right upper quadrant pain concerning for possible cholecystitis.  Ultimately patient was found to have enteropathogenic E. coli GI infection with worsening diarrhea, initially placed on IV antibiotics for broad-spectrum coverage until sensitivities came back.  At this time he has been transitioned to Cipro with markedly improving symptoms.  At this time is otherwise stable and agreeable for discharge home, outpatient follow-up with orthopedic surgery later today for bilateral lower extremity prosthesis fittings.  Otherwise follow-up with PCP as scheduled, home medications resumed as below.  Complicating patient's today is his recent eviction from his homeless shelter.  TOC case management have been helpful in providing patient with alternative resources and locations to receive ongoing care shelter and  meals.     Abdominal pain, nausea, vomiting, diarrhea, sepsis ruled out Secondary to EPEC, resolving Lactic acidosis, resolved   Failure to thrive, hypovolemia Profound electrolyte disturbances -Secondary to poor p.o. intake   Paroxysmal atrial fibrillation -CHA2DS2-VASc 1 (diabetes) -Currently rate controlled off carvedilol-no indication for anticoagulation at this point   Prolonged QT interval, chronic -Avoid QT prolonging medications -Likely complicated by malnutrition and electrolyte disturbances   Metabolic acidosis with increased anion gap Secondary to lactic acidosis, resolved   Macrocytosis without anemia Continue vitamin supplementation, increase p.o. intake as appropriate B12, TSH, ESR within normal limits   Diabetes mellitus type 2, non insulin-dependent, well controlled -Previously controlled -continue home metformin   History of liver cirrhosis Chronic thrombocytopenia Hyperbilirubinemia -Previously thought possibly from history of alcohol use.  Noted to have thrombocytopenia.    -Ammonia minimally elevated at 56 without deficits/mental status changes; lipase within normal limits   Alcohol use, rare -Prior history of alcohol use, rarely uses now    Bipolar disorder Not currently on any medications for treatment, recommend close outpatient follow-up with PCP   Tobacco use Cessation education given   Chronic ambulatory dysfunction  -Status post bilateral BKA, wheelchair-bound -Plan to follow-up with orthopedic surgery for prosthetic fitting 03/29/22 as scheduled   Noncompliance, profound  -Likely exacerbated by his homeless status, reports he does follow with a clinic "on occasion" but does not appear to fill any prescriptions regularly per our discussion   Homelessness -Transitions of care consulted, appreciate insight and recommendations, information given to patient about shelter and resources as above  Discharge Diagnoses:  Principal Problem:    Severe sepsis ([place]) Active Problems:   Abdominal pain   Nausea vomiting and diarrhea   PAF (paroxysmal atrial fibrillation) (H[facility])   Prolonged QT interval   Metabolic acidosis   Macrocytosis without  anemia   Diabetes mellitus type II, non insulin dependent ([place])   Cirrhosis of liver ([place])   Homelessness   Depressed bipolar disorder ([place])   Hyperbilirubinemia   Hypomagnesemia   Sepsis ([place])  Discharge Instructions   Allergies as of 03/29/2022   No Known Allergies      Medication List     STOP taking these medications    carvedilol 12.5 MG tablet Commonly known as: COREG   ferrous sulfate 325 (65 FE) MG tablet   lactulose 10 GM/15ML solution Commonly known as: CHRONULAC   QUEtiapine 300 MG tablet Commonly known as: SEROQUEL   traZODone 150 MG tablet Commonly known as: DESYREL       TAKE these medications    ciprofloxacin 500 MG tablet Commonly known as: CIPRO Take 1 tablet (500 mg total) by mouth 2 (two) times daily.   metFORMIN 1000 MG tablet Commonly known as: GLUCOPHAGE Take 1 tablet (1,000 mg total) by mouth 2 (two) times daily with a meal.   sertraline 50 MG tablet Commonly known as: ZOLOFT Take 1 tablet (50 mg total) by mouth daily.        Follow-up Information     [facility]. Schedule an appointment as soon as possible for a visit .   Why: April 01, 2022 at [place] information: [place] [id number] [phone]        Newt Minion, MD Follow up.   Specialty: Orthopedic Surgery Why: March 29, 2022 at 2:45 pm Contact information: [place] [id number] [phone]                No Known Allergies  Consultations: None  Procedures/Studies: US Abdomen Limited RUQ (LIVER/GB)  Result Date: 03/24/2022 CLINICAL DATA:  Pain right upper quadrant EXAM: ULTRASOUND ABDOMEN LIMITED RIGHT UPPER QUADRANT COMPARISON:  CT done on  03/24/2022 FINDINGS: Gallbladder: There are no demonstrable gallbladder stones. There is wall thickening in the gallbladder measuring 5 mm which may be partly due to incomplete distention. Technologist did not observe any tenderness over the gallbladder. There is no fluid around the gallbladder. Common bile duct: Diameter: 4 mm Liver: There is nodularity of the liver surface suggesting cirrhosis. Small ascites is noted in the perihepatic region. There is recanalization of umbilical vein suggesting possible portal hypertension. Portal vein is patent on color Doppler imaging with normal direction of blood flow towards the liver. Other: None. IMPRESSION: Nodularity and liver surface is consistent with cirrhosis. No focal abnormalities are seen in visualized portions of liver. Small perihepatic ascites is present. There is wall thickening in the gallbladder which may be due to incomplete distension or chronic cholecystitis. There are no demonstrable gallbladder stones. There are no imaging signs of acute cholecystitis. Electronically Signed   By: Elmer Picker M.D.   On: 03/24/2022 16:40   CT ABDOMEN PELVIS W CONTRAST  Result Date: 03/24/2022 CLINICAL DATA:  Abdominal pain, acute, nonlocalized EXAM: CT ABDOMEN AND PELVIS WITH CONTRAST TECHNIQUE: Multidetector CT imaging of the abdomen and pelvis was performed using the standard protocol following bolus administration of intravenous contrast. RADIATION DOSE REDUCTION: This exam was performed according to the departmental dose-optimization program which includes automated exposure control, adjustment of the mA and/or kV according to patient size and/or use of iterative reconstruction technique. CONTRAST:  159m OMNIPAQUE IOHEXOL 350 MG/ML SOLN COMPARISON:  CT of the abdomen and pelvis 09/13/2021 FINDINGS: Lower chest: Heart size is normal. Coronary artery calcifications  are present. Mild dependent atelectasis is worse right than left. No significant pleural or  pericardial effusion is present. Hepatobiliary: Shrunken irregular liver is again noted with diffuse fatty infiltration. Edematous changes surround the gallbladder with enhancement of mucosa. The common bile duct is within normal limits. Pancreas: Unremarkable. No pancreatic ductal dilatation or surrounding inflammatory changes. Spleen: Splenomegaly is stable. Spleen measures 20 cm cephalo caudad. Adrenals/Urinary Tract: The adrenal glands are within normal limits bilaterally. A simple cyst in the medial right kidney measures 2.7 cm. Recommend no follow-up imaging. No other significant renal lesions are present. No stone or mass lesion is present. No obstruction is present. Ureters are within normal limits bilaterally. The urinary bladder is unremarkable. Stomach/Bowel: Stomach and duodenum are within normal limits. Small bowel is unremarkable. There is some stranding in the small bowel mesentery. Terminal ileum is normal. The appendix is visualized and within normal limits. The ascending and transverse colon are within normal limits. Descending proximal sigmoid colon is within normal limits. There is some wall thickening in the distal rectosigmoid colon without a discrete mass. Vascular/Lymphatic: Atherosclerotic changes in the aorta branch vessels are stable. Subcentimeter periaortic nodes are stable. 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 Collaterals noted into the pelvis. Reproductive: Prostate is unremarkable. Other: No abdominal wall hernia or abnormality. No abdominopelvic ascites. Musculoskeletal: Grade 1 anterolisthesis and degenerative endplate changes are noted at L4-5. Inferior endplate Schmorl's node T9 is stable. No new lesions are present. Bony pelvis is within normal limits. The hips are located and normal. IMPRESSION: 1. Wall thickening in the distal rectosigmoid colon without a discrete mass lesion. This may represent a nonspecific colitis. 2. Inflammatory changes surround the gallbladder concerning  for acute cholecystitis. Right upper quadrant ultrasound may be useful for further evaluation. 3. Stable changes of cirrhosis and portal hypertension. 4. Coronary artery disease. 5. Aortic Atherosclerosis (ICD10-I70.0). Electronically Signed   By: San Morelle M.D.   On: 03/24/2022 14:23   DG Chest 1 View  Result Date: 03/24/2022 CLINICAL DATA:  Leukocytosis. EXAM: CHEST  1 VIEW COMPARISON:  Chest radiograph dated 03/19/2022. FINDINGS: The heart size and mediastinal contours are within normal limits. Both lungs are clear. The visualized skeletal structures are unremarkable. IMPRESSION: No active disease. Electronically Signed   By: Anner Crete M.D.   On: 03/24/2022 01:07   DG Chest Portable 1 View  Result Date: 03/19/2022 CLINICAL DATA:  Cough. EXAM: PORTABLE CHEST 1 VIEW COMPARISON:  03/12/2022 and older exams. FINDINGS: Cardiac silhouette is normal in size and configuration. Normal mediastinal and hilar contours. Clear lungs.  No pleural effusion or pneumothorax. Skeletal structures are grossly intact. IMPRESSION: No active disease. Electronically Signed   By: Lajean Manes M.D.   On: 03/19/2022 17:57   DG Chest 2 View  Result Date: 03/12/2022 CLINICAL DATA:  Shortness of breath, cough and chest pain worsening over the last 2 days. EXAM: CHEST - 2 VIEW COMPARISON:  Chest radiograph January 20, 2022 FINDINGS: The heart size and mediastinal contours are within normal limits. No focal consolidation. No pleural effusion. No pneumothorax. The visualized skeletal structures are unremarkable. IMPRESSION: No acute cardiopulmonary process. Electronically Signed   By: Dahlia Bailiff M.D.   On: 03/12/2022 18:26     Subjective: No acute issues or events overnight   Discharge Exam: Vitals:   03/29/22 0509 03/29/22 0749  BP: 90/75 132/72  Pulse: 70 69  Resp: 18 17  Temp: 97.8 F (36.6 C) 98 F (36.7 C)  SpO2: 96% 96%   Vitals:  03/28/22 1956 03/28/22 1956 03/29/22 0509 03/29/22 0749   BP: 132/79 125/62 90/75 132/72  Pulse: 63 67 70 69  Resp: _0 Temp: 97.6 F (36.4 C)  97.8 F (36.6 C) 98 F (36.7 C)  TempSrc: Oral  Oral Axillary  SpO2:  98% 96% 96%  Weight:        General: Pt is alert, awake, not in acute distress Cardiovascular: RRR, S1/S2 +, no rubs, no gallops Respiratory: CTA bilaterally, no wheezing, no rhonchi Abdominal: Soft, NT, ND, bowel sounds + Extremities: Bilateral BKA    The results of significant diagnostics from this hospitalization (including imaging, microbiology, ancillary and laboratory) are listed below for reference.     Microbiology: Recent Results (from the past 240 hour(s))  SARS Coronavirus 2 by RT PCR (hospital order, performed in [facility] hospital lab) *cepheid single result test* Anterior Nasal Swab     Status: None   Collection Time: 03/24/22  3:59 AM   Specimen: Anterior Nasal Swab  Result Value Ref Range Status   SARS Coronavirus 2 by RT PCR NEGATIVE NEGATIVE Final    Comment: (NOTE) SARS-CoV-2 target nucleic acids are NOT DETECTED.  The SARS-CoV-2 RNA is generally detectable in upper and lower respiratory specimens during the acute phase of infection. The lowest concentration of SARS-CoV-2 viral copies this assay can detect is 250 copies / mL. A negative result does not preclude SARS-CoV-2 infection and should not be used as the sole basis for treatment or other patient management decisions.  A negative result may occur with improper specimen collection / handling, submission of specimen other than nasopharyngeal swab, presence of viral mutation(s) within the areas targeted by this assay, and inadequate number of viral copies (<250 copies / mL). A negative result must be combined with clinical observations, patient history, and epidemiological information.  Fact Sheet for Patients:   https://www.patel.info/  Fact Sheet for Healthcare  Providers: https://hall.com/  This test is not yet approved or  cleared by the [place] FDA and has been authorized for detection and/or diagnosis of SARS-CoV-2 by FDA under an Emergency Use Authorization (EUA).  This EUA will remain in effect (meaning this test can be used) for the duration of the COVID-19 declaration under Section 564(b)(1) of the Act, 21 U.S.C. section 360bbb-3(b)(1), unless the authorization is terminated or revoked sooner.  Performed at [facility], [place] [address] [place], [place] [address]   Blood culture (routine x 2)     Status: None   Collection Time: 03/24/22  4:10 AM   Specimen: BLOOD RIGHT WRIST  Result Value Ref Range Status   Specimen Description BLOOD RIGHT WRIST  Final   Special Requests   Final    BOTTLES DRAWN AEROBIC AND ANAEROBIC Blood Culture adequate volume   Culture   Final    NO GROWTH 5 DAYS Performed at [facility], [place] [address], [place], [place] [address]    Report Status 03/29/2022 FINAL  Final  Blood culture (routine x 2)     Status: None   Collection Time: 03/24/22  4:15 AM   Specimen: BLOOD LEFT FOREARM  Result Value Ref Range Status   Specimen Description BLOOD LEFT FOREARM  Final   Special Requests   Final    BOTTLES DRAWN AEROBIC AND ANAEROBIC Blood Culture results may not be optimal due to an excessive volume of blood received in culture bottles   Culture   Final    NO GROWTH 5 DAYS Performed  at [facility], [place] [address] [place], [place] [address]    Report Status 03/29/2022 FINAL  Final  MRSA Next Gen by PCR, Nasal     Status: Abnormal   Collection Time: 03/25/22 10:17 AM   Specimen: Nasal Mucosa; Nasal Swab  Result Value Ref Range Status   MRSA by PCR Next Gen DETECTED (A) NOT DETECTED Final    Comment: RESULT CALLED TO, READ BACK BY AND VERIFIED WITH: L. YOUNG RN, AT 1418 03/25/22 D. VANHOOK (NOTE) The GeneXpert MRSA Assay (FDA approved for NASAL specimens  only), is one component of a comprehensive MRSA colonization surveillance program. It is not intended to diagnose MRSA infection nor to guide or monitor treatment for MRSA infections. Test performance is not FDA approved in patients less than 68 years old. Performed at [facility], [place] [address], [place], [place] [address]   Gastrointestinal Panel by PCR , Stool     Status: Abnormal   Collection Time: 03/25/22  9:29 PM   Specimen: Stool  Result Value Ref Range Status   Campylobacter species NOT DETECTED NOT DETECTED Final   Plesimonas shigelloides NOT DETECTED NOT DETECTED Final   Salmonella species NOT DETECTED NOT DETECTED Final   Yersinia enterocolitica NOT DETECTED NOT DETECTED Final   Vibrio species NOT DETECTED NOT DETECTED Final   Vibrio cholerae NOT DETECTED NOT DETECTED Final   Enteroaggregative E coli (EAEC) NOT DETECTED NOT DETECTED Final   Enteropathogenic E coli (EPEC) DETECTED (A) NOT DETECTED Final    Comment: RESULT CALLED TO, READ BACK BY AND VERIFIED WITH: LAURA HART 03/26/22 1638 SLM    Enterotoxigenic E coli (ETEC) NOT DETECTED NOT DETECTED Final   Shiga like toxin producing E coli (STEC) NOT DETECTED NOT DETECTED Final   Shigella/Enteroinvasive E coli (EIEC) NOT DETECTED NOT DETECTED Final   Cryptosporidium NOT DETECTED NOT DETECTED Final   Cyclospora cayetanensis NOT DETECTED NOT DETECTED Final   Entamoeba histolytica NOT DETECTED NOT DETECTED Final   Giardia lamblia NOT DETECTED NOT DETECTED Final   Adenovirus F40/41 NOT DETECTED NOT DETECTED Final   Astrovirus NOT DETECTED NOT DETECTED Final   Norovirus GI/GII NOT DETECTED NOT DETECTED Final   Rotavirus A NOT DETECTED NOT DETECTED Final   Sapovirus (I, II, IV, and V) NOT DETECTED NOT DETECTED Final    Comment: Performed at [facility], [address] [place], [place] [address]     Labs: BNP (last 3 results) Recent Labs    01/20/22 1751  BNP 48.2   Basic Metabolic Panel: Recent  Labs  Lab 03/25/22 0058 03/26/22 0139 03/27/22 0055 03/28/22 0147 03/29/22 0114  NA 132* 133* 134* 137 134*  K 3.3* 3.4* 3.6 3.8 3.9  CL 105 106 106 108 104  CO2 23 21* 21* 23 21*  GLUCOSE 156* 133* 130* 109* 134*  BUN 8 5* 6* 8 10  CREATININE 0.79 0.67 0.68 0.62 0.79  CALCIUM 7.8* 7.9* 8.1* 8.4* 8.4*  MG 1.5* 1.5* 1.5* 1.5* 1.4*  PHOS 2.6 2.9 3.2 4.1 3.7   Liver Function Tests: Recent Labs  Lab 03/25/22 0058 03/26/22 0139 03/27/22 0055 03/28/22 0147 03/29/22 0114  AST _0 ALT _1 ALKPHOS 63 82 83 86 94  BILITOT 1.5* 1.3* 1.6* 1.2 1.3*  PROT 4.9* 5.1* 5.4* 5.4* 5.8*  ALBUMIN 2.6* 2.7* 2.7* 2.8* 2.9*   Recent Labs  Lab 03/24/22 1330  LIPASE 29   Recent Labs  Lab 03/24/22 1833  AMMONIA 56*   CBC: Recent Labs  Lab 03/25/22 0058 03/26/22 0139 03/27/22 0055 03/28/22 0147 03/29/22 0114  WBC 5.6 5.0 4.0 4.9 5.8  HGB 10.7* 10.6* 10.7* 11.3* 12.2*  HCT 30.0* 29.1* 30.4* 32.1* 35.5*  MCV 95.5 93.3 94.4 95.0 97.8  PLT 52* 54* 58* 77* 85*   Cardiac Enzymes: No results for input(s): "CKTOTAL", "CKMB", "CKMBINDEX", "TROPONINI" in the last 168 hours. BNP: Invalid input(s): "POCBNP" CBG: Recent Labs  Lab 03/28/22 1238 03/28/22 1839 03/28/22 1959 03/29/22 0746 03/29/22 1150  GLUCAP 225* 140* 201* 120* 107*   D-Dimer No results for input(s): "DDIMER" in the last 72 hours. Hgb A1c No results for input(s): "HGBA1C" in the last 72 hours. Lipid Profile No results for input(s): "CHOL", "HDL", "LDLCALC", "TRIG", "CHOLHDL", "LDLDIRECT" in the last 72 hours. Thyroid function studies No results for input(s): "TSH", "T4TOTAL", "T3FREE", "THYROIDAB" in the last 72 hours.  Invalid input(s): "FREET3" Anemia work up No results for input(s): "VITAMINB12", "FOLATE", "FERRITIN", "TIBC", "IRON", "RETICCTPCT" in the last 72 hours. Urinalysis    Component Value Date/Time   COLORURINE AMBER (A) 03/24/2022 0724   APPEARANCEUR CLEAR 03/24/2022  0724   LABSPEC 1.025 03/24/2022 0724   PHURINE 5.0 03/24/2022 0724   GLUCOSEU NEGATIVE 03/24/2022 0724   HGBUR NEGATIVE 03/24/2022 0724   BILIRUBINUR NEGATIVE 03/24/2022 0724   KETONESUR 5 (A) 03/24/2022 0724   PROTEINUR NEGATIVE 03/24/2022 0724   NITRITE NEGATIVE 03/24/2022 0724   LEUKOCYTESUR NEGATIVE 03/24/2022 0724   Sepsis Labs Recent Labs  Lab 03/26/22 0139 03/27/22 0055 03/28/22 0147 03/29/22 0114  WBC 5.0 4.0 4.9 5.8   Microbiology Recent Results (from the past 240 hour(s))  SARS Coronavirus 2 by RT PCR (hospital order, performed in [facility] hospital lab) *cepheid single result test* Anterior Nasal Swab     Status: None   Collection Time: 03/24/22  3:59 AM   Specimen: Anterior Nasal Swab  Result Value Ref Range Status   SARS Coronavirus 2 by RT PCR NEGATIVE NEGATIVE Final    Comment: (NOTE) SARS-CoV-2 target nucleic acids are NOT DETECTED.  The SARS-CoV-2 RNA is generally detectable in upper and lower respiratory specimens during the acute phase of infection. The lowest concentration of SARS-CoV-2 viral copies this assay can detect is 250 copies / mL. A negative result does not preclude SARS-CoV-2 infection and should not be used as the sole basis for treatment or other patient management decisions.  A negative result may occur with improper specimen collection / handling, submission of specimen other than nasopharyngeal swab, presence of viral mutation(s) within the areas targeted by this assay, and inadequate number of viral copies (<250 copies / mL). A negative result must be combined with clinical observations, patient history, and epidemiological information.  Fact Sheet for Patients:   https://www.patel.info/  Fact Sheet for Healthcare Providers: https://hall.com/  This test is not yet approved or  cleared by the [place] FDA and has been authorized for detection and/or diagnosis of SARS-CoV-2 by FDA  under an Emergency Use Authorization (EUA).  This EUA will remain in effect (meaning this test can be used) for the duration of the COVID-19 declaration under Section 564(b)(1) of the Act, 21 U.S.C. section 360bbb-3(b)(1), unless the authorization is terminated or revoked sooner.  Performed at [facility], [place] [address] [place], [place] [address]   Blood culture (routine x 2)     Status: None   Collection Time: 03/24/22  4:10 AM   Specimen: BLOOD RIGHT  WRIST  Result Value Ref Range Status   Specimen Description BLOOD RIGHT WRIST  Final   Special Requests   Final    BOTTLES DRAWN AEROBIC AND ANAEROBIC Blood Culture adequate volume   Culture   Final    NO GROWTH 5 DAYS Performed at [facility], [address] [place], [place] [address]    Report Status 03/29/2022 FINAL  Final  Blood culture (routine x 2)     Status: None   Collection Time: 03/24/22  4:15 AM   Specimen: BLOOD LEFT FOREARM  Result Value Ref Range Status   Specimen Description BLOOD LEFT FOREARM  Final   Special Requests   Final    BOTTLES DRAWN AEROBIC AND ANAEROBIC Blood Culture results may not be optimal due to an excessive volume of blood received in culture bottles   Culture   Final    NO GROWTH 5 DAYS Performed at [facility], [place] [address], [place], [place] [address]    Report Status 03/29/2022 FINAL  Final  MRSA Next Gen by PCR, Nasal     Status: Abnormal   Collection Time: 03/25/22 10:17 AM   Specimen: Nasal Mucosa; Nasal Swab  Result Value Ref Range Status   MRSA by PCR Next Gen DETECTED (A) NOT DETECTED Final    Comment: RESULT CALLED TO, READ BACK BY AND VERIFIED WITH: L. YOUNG RN, AT 1418 03/25/22 D. VANHOOK (NOTE) The GeneXpert MRSA Assay (FDA approved for NASAL specimens only), is one component of a comprehensive MRSA colonization surveillance program. It is not intended to diagnose MRSA infection nor to guide or monitor treatment for MRSA infections. Test performance is  not FDA approved in patients less than 52 years old. Performed at [facility], [place] [address] [place], [place] [address]   Gastrointestinal Panel by PCR , Stool     Status: Abnormal   Collection Time: 03/25/22  9:29 PM   Specimen: Stool  Result Value Ref Range Status   Campylobacter species NOT DETECTED NOT DETECTED Final   Plesimonas shigelloides NOT DETECTED NOT DETECTED Final   Salmonella species NOT DETECTED NOT DETECTED Final   Yersinia enterocolitica NOT DETECTED NOT DETECTED Final   Vibrio species NOT DETECTED NOT DETECTED Final   Vibrio cholerae NOT DETECTED NOT DETECTED Final   Enteroaggregative E coli (EAEC) NOT DETECTED NOT DETECTED Final   Enteropathogenic E coli (EPEC) DETECTED (A) NOT DETECTED Final    Comment: RESULT CALLED TO, READ BACK BY AND VERIFIED WITH: LAURA HART 03/26/22 1638 SLM    Enterotoxigenic E coli (ETEC) NOT DETECTED NOT DETECTED Final   Shiga like toxin producing E coli (STEC) NOT DETECTED NOT DETECTED Final   Shigella/Enteroinvasive E coli (EIEC) NOT DETECTED NOT DETECTED Final   Cryptosporidium NOT DETECTED NOT DETECTED Final   Cyclospora cayetanensis NOT DETECTED NOT DETECTED Final   Entamoeba histolytica NOT DETECTED NOT DETECTED Final   Giardia lamblia NOT DETECTED NOT DETECTED Final   Adenovirus F40/41 NOT DETECTED NOT DETECTED Final   Astrovirus NOT DETECTED NOT DETECTED Final   Norovirus GI/GII NOT DETECTED NOT DETECTED Final   Rotavirus A NOT DETECTED NOT DETECTED Final   Sapovirus (I, II, IV, and V) NOT DETECTED NOT DETECTED Final    Comment: Performed at [facility], [address], [place], [place] [address]     Time coordinating discharge: Over 30 minutes  SIGNED:   Little Ishikawa, DO [facility] 03/29/2022, 2:27 PM Pager   If 7PM-7AM,  please contact night-coverage [email or web address]

## 2022-04-01 ENCOUNTER — Ambulatory Visit: Payer: Medicare Other | Attending: Internal Medicine | Admitting: Internal Medicine

## 2022-04-01 VITALS — BP 129/78 | HR 76

## 2022-04-01 DIAGNOSIS — F1911 Other psychoactive substance abuse, in remission: Secondary | ICD-10-CM

## 2022-04-01 DIAGNOSIS — Z8659 Personal history of other mental and behavioral disorders: Secondary | ICD-10-CM

## 2022-04-01 DIAGNOSIS — Z89512 Acquired absence of left leg below knee: Secondary | ICD-10-CM

## 2022-04-01 DIAGNOSIS — Z23 Encounter for immunization: Secondary | ICD-10-CM

## 2022-04-01 DIAGNOSIS — A040 Enteropathogenic Escherichia coli infection: Secondary | ICD-10-CM

## 2022-04-01 DIAGNOSIS — Z7689 Persons encountering health services in other specified circumstances: Secondary | ICD-10-CM

## 2022-04-01 DIAGNOSIS — E119 Type 2 diabetes mellitus without complications: Secondary | ICD-10-CM

## 2022-04-01 DIAGNOSIS — F320 Major depressive disorder, single episode, mild: Secondary | ICD-10-CM

## 2022-04-01 DIAGNOSIS — F411 Generalized anxiety disorder: Secondary | ICD-10-CM

## 2022-04-01 DIAGNOSIS — F17200 Nicotine dependence, unspecified, uncomplicated: Secondary | ICD-10-CM

## 2022-04-01 DIAGNOSIS — Z89511 Acquired absence of right leg below knee: Secondary | ICD-10-CM

## 2022-04-01 DIAGNOSIS — Z5900 Homelessness unspecified: Secondary | ICD-10-CM | POA: Diagnosis not present

## 2022-04-01 MED ORDER — SERTRALINE HCL 50 MG PO TABS: 50.0000 mg | ORAL_TABLET | Freq: Every day | ORAL | 2 refills | Status: DC
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
## 2022-04-01 NOTE — Progress Notes (Signed)
Needs [facility] referral for medication refills.

## 2022-04-03 LAB — MICROALBUMIN / CREATININE URINE RATIO
Creatinine, Urine: 125 mg/dL
Microalb/Creat Ratio: 3 mg/g creat (ref 0–29)
Microalbumin, Urine: 4 ug/mL

## 2022-04-05 ENCOUNTER — Encounter: Payer: Self-pay | Admitting: Orthopedic Surgery

## 2022-04-05 NOTE — Progress Notes (Addendum)
Office Visit Note   Patient: Cory Palmer           Date of Birth: 1961-02-04           MRN: [id number] Visit Date: 03/29/2022              Requested by: Dois Davenport, MD [address] [place],  [place] [address] PCP: No primary care provider on file.  Chief Complaint  Patient presents with   Left Leg - Wound Check      HPI: Patient is a 61 year old gentleman with a left transtibial amputation.  Patient states he has a mass at the tibial tubercle on the left with started 6 months ago.  Patient denies any injuries.  Patient states he has not received his leg from [facility].  Assessment & Plan: Visit Diagnoses:  1. 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HCC)     Plan: Patient will follow-up with [facility] for prosthetic fitting.  Follow-Up Instructions: No follow-ups on file.   Ortho Exam  Patient is alert, oriented, no adenopathy, well-dressed, normal affect, normal respiratory effort. Examination patient has a calcified bursa over the tibial tubercle which appears to be pressure from ambulating on his knees.  There is no redness no cellulitis no skin breakdown no signs of infection.  Patient is an existing left transtibial  amputee.  Patient's current comorbidities are not expected to impact the ability to function with the prescribed prosthesis. Patient verbally communicates a strong desire to use a prosthesis. Patient currently requires mobility aids to ambulate without a prosthesis.  Expects not to use mobility aids with a new prosthesis.  Patient is a K3 level ambulator that spends a lot of time walking around on uneven terrain over obstacles, up and down stairs, and ambulates with a variable cadence.     Imaging: No results found. No images are attached to the encounter.  Labs: Lab Results  Component Value Date   HGBA1C 6.5 (H) 11/12/2021   HGBA1C 6.6 (H) 10/10/2020   HGBA1C 6.8 (H) 10/19/2019   ESRSEDRATE 10 03/24/2022   ESRSEDRATE  34 (H) 08/15/2019   ESRSEDRATE 31 (H) 08/05/2019   CRP 6.5 (H) 03/24/2022   CRP 15.0 (H) 08/15/2019   CRP 11.0 (H) 08/05/2019   REPTSTATUS 03/29/2022 FINAL 03/24/2022   GRAMSTAIN  11/19/2019    RARE WBC PRESENT, PREDOMINANTLY PMN RARE GRAM POSITIVE COCCI    CULT  03/24/2022    NO GROWTH 5 DAYS Performed at [facility], [address], [place], [place] [address]    LABORGA METHICILLIN RESISTANT STAPHYLOCOCCUS AUREUS 11/19/2019     Lab Results  Component Value Date   ALBUMIN 2.9 (L) 03/29/2022   ALBUMIN 2.8 (L) 03/28/2022   ALBUMIN 2.7 (L) 03/27/2022   PREALBUMIN 11.2 (L) 05/08/2018    Lab Results  Component Value Date   MG 1.4 (L) 03/29/2022   MG 1.5 (L) 03/28/2022   MG 1.5 (L) 03/27/2022   No results found for: "VD25OH"  Lab Results  Component Value Date   PREALBUMIN 11.2 (L) 05/08/2018      Latest Ref Rng & Units 03/29/2022    1:14 AM 03/28/2022    1:47 AM 03/27/2022   12:55 AM  CBC EXTENDED  WBC 4.0 - 10.5 K/uL 5.8  4.9  4.0   RBC 4.22 - 5.81 MIL/uL 3.63  3.38  3.22   Hemoglobin 13.0 - 17.0 g/dL 24.0  97.3  53.2   HCT 39.0 - 52.0 %  35.5  32.1  30.4   Platelets 150 - 400 K/uL 85  77  58      There is no height or weight on file to calculate BMI.  Orders:  Orders Placed This Encounter  Procedures   XR Tibia/Fibula Left   No orders of the defined types were placed in this encounter.    Procedures: No procedures performed  Clinical Data: No additional findings.  ROS:  All other systems negative, except as noted in the HPI. Review of Systems  Objective: Vital Signs: There were no vitals taken for this visit.  Specialty Comments:  No specialty comments available.  PMFS History: Patient Active Problem List   Diagnosis Date Noted   Severe sepsis (HCC) 03/24/2022   Abdominal pain 03/24/2022   Nausea vomiting and diarrhea 03/24/2022   Prolonged QT interval 03/24/2022   Macrocytosis without anemia 03/24/2022   Sepsis (HCC) 03/24/2022    Acute hepatic encephalopathy (HCC) 11/17/2021   PVD (peripheral vascular disease) (HCC) 11/17/2021   Metabolic acidosis 11/17/2021   Esophagitis 11/17/2021   Acute metabolic encephalopathy 11/11/2021   Thrombocytopenia (HCC) 11/11/2021   Leukocytosis 10/10/2020   GERD (gastroesophageal reflux disease) 10/10/2020   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HCC) 05/06/2020   Hyponatremia 10/23/2019   MRSA bacteremia 10/21/2019   Atrial flutter with rapid ventricular response (HCC) 10/21/2019   Lactic acidosis 10/21/2019   Severe protein-calorie malnutrition (HCC)    Diabetic polyneuropathy associated with type 2 diabetes mellitus (HCC)    Cirrhosis of liver (HCC) 10/19/2019   Anemia 10/19/2019   Medication monitoring encounter 08/15/2019   Serum total bilirubin elevated 10/29/2018   BPH (benign prostatic hyperplasia) 10/29/2018   Below-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)    PAF (paroxysmal atrial fibrillation) (HCC)    Hypokalemia    Hypomagnesemia    Sepsis without acute organ dysfunction (HCC) 10/05/2018   Depressed bipolar disorder (HCC) 10/05/2018   Hypertension 10/05/2018   Hyperbilirubinemia 10/05/2018   Homelessness 05/07/2018   Diabetes mellitus type II, non insulin dependent (HCC) 05/07/2018   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    Family History  Problem Relation Age of Onset   Hypertension Other    Diabetes Mellitus II Other     Past Surgical History:  Procedure Laterality Date   AMPUTATION Left 09/28/2018   Procedure: LEFT TRANSMETATARSAL AMPUTATION;  Surgeon: Nadara Mustard, MD;  Location: [facility] OR;  Service:  Orthopedics;  Laterality: Left;   AMPUTATION Left 10/23/2019   Procedure: AMPUTATION BELOW KNEE;  Surgeon: Nadara Mustard, MD;  Location: [facility] OR;  Service: Orthopedics;  Laterality: Left;   BELOW KNEE LEG AMPUTATION Right    ESOPHAGOGASTRODUODENOSCOPY (EGD) WITH PROPOFOL N/A 10/19/2020   Procedure: ESOPHAGOGASTRODUODENOSCOPY (EGD) WITH PROPOFOL;  Surgeon: Charna Elizabeth, MD;  Location: [facility] ENDOSCOPY;  Service: Endoscopy;  Laterality: N/A;   TEE WITHOUT CARDIOVERSION N/A 10/25/2019   Procedure: TRANSESOPHAGEAL ECHOCARDIOGRAM (TEE);  Surgeon: Lewayne Bunting, MD;  Location: [facility] ENDOSCOPY;  Service: Cardiovascular;  Laterality: N/A;   Social History   Occupational History   Not on file  Tobacco Use   Smoking status:  Every Day    Packs/day: 0.25    Types: Cigarettes   Smokeless tobacco: Never  Vaping Use   Vaping Use: Never used  Substance and Sexual Activity   Alcohol use: Not Currently   Drug use: Not Currently    Types: Cocaine    Comment: Last used in July 2023   Sexual activity: Not Currently

## 2022-04-06 NOTE — Progress Notes (Signed)
Unable to reach patient by phone 

## 2022-04-09 ENCOUNTER — Emergency Department (HOSPITAL_COMMUNITY)
Admission: EM | Admit: 2022-04-09 | Discharge: 2022-04-11 | Disposition: A | Discharge status: Home / Self Care | Payer: Medicare Other | Attending: Emergency Medicine | Admitting: Emergency Medicine

## 2022-04-09 ENCOUNTER — Other Ambulatory Visit: Payer: Self-pay

## 2022-04-09 ENCOUNTER — Encounter (HOSPITAL_COMMUNITY): Payer: Self-pay

## 2022-04-09 DIAGNOSIS — I10 Essential (primary) hypertension: Secondary | ICD-10-CM | POA: Diagnosis not present

## 2022-04-09 DIAGNOSIS — F4323 Adjustment disorder with mixed anxiety and depressed mood: Secondary | ICD-10-CM

## 2022-04-09 DIAGNOSIS — F1010 Alcohol abuse, uncomplicated: Secondary | ICD-10-CM

## 2022-04-09 DIAGNOSIS — R45851 Suicidal ideations: Secondary | ICD-10-CM | POA: Insufficient documentation

## 2022-04-09 DIAGNOSIS — F10188 Alcohol abuse with other alcohol-induced disorder: Secondary | ICD-10-CM | POA: Diagnosis not present

## 2022-04-09 DIAGNOSIS — Y907 Blood alcohol level of 200-239 mg/100 ml: Secondary | ICD-10-CM | POA: Diagnosis not present

## 2022-04-09 DIAGNOSIS — E119 Type 2 diabetes mellitus without complications: Secondary | ICD-10-CM | POA: Insufficient documentation

## 2022-04-09 DIAGNOSIS — E876 Hypokalemia: Secondary | ICD-10-CM | POA: Insufficient documentation

## 2022-04-09 DIAGNOSIS — Z5900 Homelessness unspecified: Secondary | ICD-10-CM

## 2022-04-09 DIAGNOSIS — F1094 Alcohol use, unspecified with alcohol-induced mood disorder: Secondary | ICD-10-CM

## 2022-04-09 DIAGNOSIS — F1414 Cocaine abuse with cocaine-induced mood disorder: Secondary | ICD-10-CM

## 2022-04-09 DIAGNOSIS — S80211A Abrasion, right knee, initial encounter: Secondary | ICD-10-CM | POA: Insufficient documentation

## 2022-04-09 DIAGNOSIS — R45850 Homicidal ideations: Secondary | ICD-10-CM | POA: Insufficient documentation

## 2022-04-09 DIAGNOSIS — S80212A Abrasion, left knee, initial encounter: Secondary | ICD-10-CM | POA: Insufficient documentation

## 2022-04-09 DIAGNOSIS — Z7984 Long term (current) use of oral hypoglycemic drugs: Secondary | ICD-10-CM | POA: Insufficient documentation

## 2022-04-09 DIAGNOSIS — F17210 Nicotine dependence, cigarettes, uncomplicated: Secondary | ICD-10-CM | POA: Diagnosis not present

## 2022-04-09 DIAGNOSIS — Z20822 Contact with and (suspected) exposure to covid-19: Secondary | ICD-10-CM | POA: Diagnosis not present

## 2022-04-09 DIAGNOSIS — S8991XA Unspecified injury of right lower leg, initial encounter: Secondary | ICD-10-CM | POA: Diagnosis present

## 2022-04-09 DIAGNOSIS — T07XXXA Unspecified multiple injuries, initial encounter: Secondary | ICD-10-CM

## 2022-04-09 DIAGNOSIS — F14188 Cocaine abuse with other cocaine-induced disorder: Secondary | ICD-10-CM | POA: Insufficient documentation

## 2022-04-09 DIAGNOSIS — F339 Major depressive disorder, recurrent, unspecified: Secondary | ICD-10-CM | POA: Insufficient documentation

## 2022-04-09 DIAGNOSIS — W19XXXA Unspecified fall, initial encounter: Secondary | ICD-10-CM | POA: Insufficient documentation

## 2022-04-09 LAB — COMPREHENSIVE METABOLIC PANEL
ALT: 16 U/L (ref 0–44)
AST: 31 U/L (ref 15–41)
Albumin: 3 g/dL — ABNORMAL LOW (ref 3.5–5.0)
Alkaline Phosphatase: 73 U/L (ref 38–126)
Anion gap: 12 (ref 5–15)
BUN: 10 mg/dL (ref 8–23)
CO2: 18 mmol/L — ABNORMAL LOW (ref 22–32)
Calcium: 8.4 mg/dL — ABNORMAL LOW (ref 8.9–10.3)
Chloride: 107 mmol/L (ref 98–111)
Creatinine, Ser: 0.71 mg/dL (ref 0.61–1.24)
GFR, Estimated: >60 mL/min (ref >60)
Glucose, Bld: 111 mg/dL — ABNORMAL HIGH (ref 70–99)
Potassium: 3 mmol/L — ABNORMAL LOW (ref 3.5–5.1)
Sodium: 137 mmol/L (ref 135–145)
Total Bilirubin: 1.2 mg/dL (ref 0.3–1.2)
Total Protein: 5.7 g/dL — ABNORMAL LOW (ref 6.5–8.1)

## 2022-04-09 LAB — RAPID URINE DRUG SCREEN, HOSP PERFORMED
Amphetamines: NOT DETECTED
Barbiturates: NOT DETECTED
Benzodiazepines: NOT DETECTED
Cocaine: POSITIVE — AB
Opiates: NOT DETECTED
Tetrahydrocannabinol: NOT DETECTED

## 2022-04-09 LAB — CBC WITH DIFFERENTIAL/PLATELET
Abs Immature Granulocytes: 0.01 10*3/uL (ref 0.00–0.07)
Basophils Absolute: 0.1 10*3/uL (ref 0.0–0.1)
Basophils Relative: 1 %
Eosinophils Absolute: 0.3 10*3/uL (ref 0.0–0.5)
Eosinophils Relative: 4 %
HCT: 34.8 % — ABNORMAL LOW (ref 39.0–52.0)
Hemoglobin: 11.8 g/dL — ABNORMAL LOW (ref 13.0–17.0)
Immature Granulocytes: 0 %
Lymphocytes Relative: 34 %
Lymphs Abs: 2.3 10*3/uL (ref 0.7–4.0)
MCH: 33.9 pg (ref 26.0–34.0)
MCHC: 33.9 g/dL (ref 30.0–36.0)
MCV: 100 fL (ref 80.0–100.0)
Monocytes Absolute: 0.5 10*3/uL (ref 0.1–1.0)
Monocytes Relative: 7 %
Neutro Abs: 3.6 10*3/uL (ref 1.7–7.7)
Neutrophils Relative %: 54 %
Platelets: 120 10*3/uL — ABNORMAL LOW (ref 150–400)
RBC: 3.48 MIL/uL — ABNORMAL LOW (ref 4.22–5.81)
RDW: 14.1 % (ref 11.5–15.5)
WBC: 6.7 10*3/uL (ref 4.0–10.5)
nRBC: 0 % (ref 0.0–0.2)

## 2022-04-09 LAB — RESP PANEL BY RT-PCR (FLU A&B, COVID) ARPGX2
Influenza A by PCR: NEGATIVE
Influenza B by PCR: NEGATIVE
SARS Coronavirus 2 by RT PCR: NEGATIVE

## 2022-04-09 LAB — ETHANOL: Alcohol, Ethyl (B): 239 mg/dL — ABNORMAL HIGH (ref <10)

## 2022-04-09 MED ORDER — POTASSIUM CHLORIDE CRYS ER 20 MEQ PO TBCR
40.0000 meq | EXTENDED_RELEASE_TABLET | Freq: Once | ORAL | Status: AC
  Administered 2022-04-09: 40 meq via ORAL
  Filled 2022-04-09: qty 2

## 2022-04-09 NOTE — ED Triage Notes (Signed)
Pt bib [facility], patient wheel out of wheelchair in front of [employer]. Homeless, Etoh, suicidal/ homicidal, Afib, hypotensive   90/60bp 96% room air fluid 74-90 HR 97.6  155 CBG  20 L AC

## 2022-04-09 NOTE — ED Notes (Addendum)
As the nurse was taking all of the pt's belongings out of the pt's room, pt stated "at 10:00pm I am going to leave because no one asked for my permission to take my stuff. I have not been helped or given any food" Pt informed due to him being a psych hold we are to take his belongings and place them in security.

## 2022-04-09 NOTE — ED Notes (Signed)
Pt given drink and crackers and belongings were placed in purple [facility].

## 2022-04-09 NOTE — ED Provider Notes (Signed)
[facility] EMERGENCY DEPARTMENT Provider Note   CSN: [id number] Arrival date & time: 04/09/22  1644     History  Chief Complaint  Patient presents with   Cory Palmer is a 61 y.o. male.  The history is provided by the patient and medical records. No language interpreter was used.  Fall This is a new problem. The current episode started less than 1 hour ago. The problem occurs rarely. Pertinent negatives include no chest pain, no abdominal pain, no headaches and no shortness of breath. Nothing aggravates the symptoms. Nothing relieves the symptoms. He has tried nothing for the symptoms. The treatment provided no relief.       Home Medications Prior to Admission medications   Medication Sig Start Date End Date Taking? Authorizing Provider  ciprofloxacin (CIPRO) 500 MG tablet Take 1 tablet (500 mg total) by mouth 2 (two) times daily. Patient not taking: Reported on 04/01/2022 03/29/22   Azucena Fallen, MD  metFORMIN (GLUCOPHAGE) 1000 MG tablet Take 1 tablet (1,000 mg total) by mouth 2 (two) times daily with a meal. 03/29/22 04/28/22  Azucena Fallen, MD  sertraline (ZOLOFT) 50 MG tablet Take 1 tablet (50 mg total) by mouth daily. 04/01/22   Marcine Matar, MD      Allergies    Patient has no known allergies.    Review of Systems   Review of Systems  Constitutional:  Negative for chills, diaphoresis, fatigue and fever.  HENT:  Negative for ear pain and sore throat.   Eyes:  Negative for pain and visual disturbance.  Respiratory:  Negative for cough and shortness of breath.   Cardiovascular:  Negative for chest pain and palpitations.  Gastrointestinal:  Negative for abdominal pain and vomiting.  Genitourinary:  Negative for dysuria and hematuria.  Musculoskeletal:  Negative for arthralgias and back pain.  Skin:  Negative for color change and rash.  Neurological:  Negative for seizures, syncope and headaches.  Psychiatric/Behavioral:   Positive for suicidal ideas.   All other systems reviewed and are negative.   Physical Exam Updated Vital Signs BP (!) 89/65 (BP Location: Right Arm)   Pulse 61   Temp 97.6 F (36.4 C) (Oral)   SpO2 95%  Physical Exam Vitals and nursing note reviewed.  Constitutional:      General: He is not in acute distress.    Appearance: He is well-developed. He is not ill-appearing or toxic-appearing.  HENT:     Head: Normocephalic and atraumatic.     Nose: Nose normal.     Mouth/Throat:     Mouth: Mucous membranes are moist.  Eyes:     Conjunctiva/sclera: Conjunctivae normal.  Cardiovascular:     Rate and Rhythm: Normal rate and regular rhythm.     Heart sounds: No murmur heard. Pulmonary:     Effort: Pulmonary effort is normal. No respiratory distress.     Breath sounds: Normal breath sounds. No wheezing, rhonchi or rales.  Chest:     Chest wall: No tenderness.  Abdominal:     Palpations: Abdomen is soft.     Tenderness: There is no abdominal tenderness. There is no guarding or rebound.  Musculoskeletal:        General: Tenderness present. No swelling.     Cervical back: Neck supple. No tenderness.     Right lower leg: No edema.     Left lower leg: No edema.     Comments: Abrasions to both knees.  Nontender.  He did not want x-rays of them.  Skin:    General: Skin is warm and dry.     Capillary Refill: Capillary refill takes less than 2 seconds.  Neurological:     Mental Status: He is alert. Mental status is at baseline.     Sensory: No sensory deficit.     Motor: No weakness.  Psychiatric:        Mood and Affect: Mood is not anxious.        Behavior: Behavior is not agitated.        Thought Content: Thought content includes homicidal and suicidal ideation. Thought content includes homicidal and suicidal plan.     Comments: Patient reports suicidal ideation and homicidal ideation with a plan to use a firearm he has locked up in a safe.     ED Results / Procedures /  Treatments   Labs (all labs ordered are listed, but only abnormal results are displayed) Labs Reviewed  COMPREHENSIVE METABOLIC PANEL - Abnormal; Notable for the following components:      Result Value   Potassium 3.0 (*)    CO2 18 (*)    Glucose, Bld 111 (*)    Calcium 8.4 (*)    Total Protein 5.7 (*)    Albumin 3.0 (*)    All other components within normal limits  ETHANOL - Abnormal; Notable for the following components:   Alcohol, Ethyl (B) 239 (*)    All other components within normal limits  CBC WITH DIFFERENTIAL/PLATELET - Abnormal; Notable for the following components:   RBC 3.48 (*)    Hemoglobin 11.8 (*)    HCT 34.8 (*)    Platelets 120 (*)    All other components within normal limits  RESP PANEL BY RT-PCR (FLU A&B, COVID) [id number]  RAPID URINE DRUG SCREEN, HOSP PERFORMED    EKG EKG Interpretation  Date/Time:  Saturday April 09 2022 16:51:23 EDT Ventricular Rate:  79 PR Interval:    QRS Duration: 111 QT Interval:  480 QTC Calculation: 537 R Axis:   31 Text Interpretation: Atrial fibrillation Borderline low voltage, extremity leads Prolonged QT interval when compared to prior, similar appearance with slightly longer QTc. No STEMI Confirmed by Theda Belfast ([id number]) on 04/09/2022 5:26:10 PM  Radiology No results found.  Procedures Procedures    Medications Ordered in ED Medications  potassium chloride SA (KLOR-CON M) CR tablet 40 mEq (40 mEq Oral Given 04/09/22 2115)    ED Course/ Medical Decision Making/ A&P                           Medical Decision Making Amount and/or Complexity of Data Reviewed Labs: ordered.    Cory Palmer is a 61 y.o. male with a past medical history significant for diabetes, homelessness, hypertension, paroxysmal atrial fibrillation, bilateral below the knee amputations, GERD, cirrhosis, and prolonged QT who presents for suicidal ideation, homicidal ideation, and a fall out of his wheelchair.  According to patient, he is very  frustrated with being homeless and today had an accidental fall out of his wheelchair as it was the wrong size.  He did not hit his head and is only complaining of some mild scrapes to his legs.  He is denying significant pain there and does not want x-rays of them.  He is more concerned today about his thoughts of suicide and homicide and says that he has a gun locked in a safe and he  has a plan to use it to hurt people and himself.  He denies any other complaints and denies any chest pain, short of breath, nausea, vomiting, constipation, diarrhea, or urinary changes.  He says that he has not taken medicine for many months and he wants help with his thoughts.  He also reports he drinks alcohol heavily but denies any history of significant withdrawals or seizures in the past.  On my exam, lungs clear chest nontender.  Abdomen nontender.  Patient is resting.  Legs have several abrasions but her not critically tender and otherwise he is well-appearing.  Patient did report the SI and HI.  Patient agrees to get some screening labs for psychiatric clearance and then talk to TTS.  TTS saw the patient and feels he needs to be assessed in the morning.  Patient's labs showed some low potassium so this was repleted orally.  Otherwise labs similar to prior.  Alcohol is elevated, he will continue to metabolize this.  As he reports no history of significant withdrawals or seizures, low suspicion he will have DTs.  If symptoms were to escalate from a withdrawal standpoint, he may need Ativan.  At this time given his labs, I do feel he is medically clear for psychiatric management.  We will await psychiatric recommendations in the morning.          Final Clinical Impression(s) / ED Diagnoses Final diagnoses:  Suicidal ideation  Homicidal ideation  Fall, initial encounter  Multiple abrasions  Hypokalemia     Clinical Impression: 1. Suicidal ideation   2. Homicidal ideation   3. Fall, initial  encounter   4. Multiple abrasions   5. Hypokalemia     Disposition: Awaiting psychiatric recommendations in the morning.  This note was prepared with assistance of Conservation officer, historic buildings. Occasional wrong-word or sound-a-like substitutions may have occurred due to the inherent limitations of voice recognition software.     Kehinde Totzke, Canary Brim, MD 04/09/22 2136

## 2022-04-09 NOTE — ED Notes (Signed)
Peanut butter and crackers given to pt
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
## 2022-04-10 DIAGNOSIS — F1414 Cocaine abuse with cocaine-induced mood disorder: Secondary | ICD-10-CM

## 2022-04-10 DIAGNOSIS — F4323 Adjustment disorder with mixed anxiety and depressed mood: Secondary | ICD-10-CM | POA: Diagnosis not present

## 2022-04-10 DIAGNOSIS — F1010 Alcohol abuse, uncomplicated: Secondary | ICD-10-CM

## 2022-04-10 DIAGNOSIS — F1094 Alcohol use, unspecified with alcohol-induced mood disorder: Secondary | ICD-10-CM

## 2022-04-10 NOTE — ED Notes (Signed)
Pt belongings in [place] [address], [place] [address]. Wheelchair is in [address] hallway across from nurses station, but near pt for transfers; not within reach of pt

## 2022-04-10 NOTE — ED Notes (Signed)
Patient breakfast tray at bedside. Patient pleasant demeanor at this time.

## 2022-04-10 NOTE — ED Provider Notes (Signed)
Emergency Medicine Observation Re-evaluation Note  Cory Palmer is a 61 y.o. male, seen on rounds today.  Pt initially presented to the ED for complaints of Fall and Suicidal Currently, the patient is awake eating breakfast in no acute distress.  Physical Exam  BP (!) 162/86   Pulse 68   Temp 97.6 F (36.4 C) (Oral)   Resp 20   SpO2 97%  Physical Exam General: Awake and alert in no acute distress Cardiac: Regular rate and rhythm Lungs: No increased work of breathing Psych: Calm and cooperative  ED Course / MDM  EKG:EKG Interpretation  Date/Time:  Saturday April 09 2022 16:51:23 EDT Ventricular Rate:  79 PR Interval:    QRS Duration: 111 QT Interval:  480 QTC Calculation: 537 R Axis:   31 Text Interpretation: Atrial fibrillation Borderline low voltage, extremity leads Prolonged QT interval when compared to prior, similar appearance with slightly longer QTc. No STEMI Confirmed by Theda Belfast ([id number]) on 04/09/2022 5:26:10 PM  I have reviewed the labs performed to date as well as medications administered while in observation.  Recent changes in the last 24 hours include patient was medically cleared yesterday with the plan for TTS reevaluation this morning once the patient is sober.  Plan  Current plan is for psychiatric evaluation this morning for disposition planning.    Phoebe Sharps, DO 04/10/22 [phone]

## 2022-04-10 NOTE — Care Management (Signed)
PCP in patient instructions, as this needs to be established for long term care. He will have to go through primary care  and have CSW there assist patient and family with placement.

## 2022-04-10 NOTE — Consult Note (Signed)
Telepsych Consultation   Reason for Consult:  Psychiatric Reassessment  Referring Physician:  Tegeler, Canary Brimhristopher J, MD [facility] Location of Provider: Other: virtual home office  Patient Identification: Cory Drillinghomas B Aument MRN:  [id number] Principal Diagnosis: Adjustment disorder with mixed anxiety and depressed mood Diagnosis:  Principal Problem:   Adjustment disorder with mixed anxiety and depressed mood Active Problems:   Homelessness   Alcohol abuse   Cocaine abuse with cocaine-induced mood disorder (HCC)   Alcohol-induced mood disorder (HCC)   Total Time spent with patient: 30 minutes  Subjective:   Cory Palmer is a 61 y.o. male patient who [facility] after a [facility]. While there pt endorsed suicidal ideations and was referred for psychiatric assessment.  Pt was initially evaluated on 9/9, did not meet criteria for psychiatric admission but recommended for overnight observation and AM reassessment. Patient's admission UDS was positive for cocaine, and BAL was 239.    HPI:  Patient seen via telepsych by this provider; chart reviewed and consulted with Dr. Sherron FlemingsMassengill on 04/10/22.  On evaluation Cory Palmer reports, he's doing okay but did not sleep good last night.  He reports suicidal ideations related to medical concerns; states within the past 2 months he's put himself in compromising situations ,"drinking, arguing" but has not worked out a plan to end his life and does not verbalize intent today.  He reports his primary concern is homelessness; also reports chronic medical concerns. He is unsure of the last time he saw PCP and cannot state the name.  Pt denies thoughts or intent to harm others, admits he was intoxicated on admission and may have said some things he did not mean.   He's been [facility] at [facility] for the past 2 years but was recently banned.  He is guarded about the specific events that led to this but  states he was speaking up for his rights and was asked to leave.     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  While laying in bed today he continuously moves his legs, states this is due to his pain and it's hard for him to control.     He reports a history for Bipolar, Depression, Anxiety, Panic Attacks, Insomnia, and is followed by [facility] for medication management but he's unsure of when he last saw them.  Pt reports,"when I'm off my medications, I resort to using drugs." He reports he does not has money or transportation to get to [facility].  He reports he gets $1500 monthly disability checks that he's recently been using for drugs and alcohol.   During evaluation Cory Palmer is laying in bed; He is alert/oriented x 4; anxious but cooperative; and mood congruent with affect.  Patient is speaking in a clear tone at moderate volume, and normal pace; with good eye contact.  His thought process is coherent and relevant; There is no indication that he is currently responding to internal/external stimuli or experiencing delusional thought content.  Patient endorses passive suicidal ideations but denies plan or intent for self harm.  He denies homicidal ideation, psychosis, and paranoia.  Patient has remained calm throughout assessment and has answered questions appropriately.    Per ED Provider Admissions Assessment 04/09/2022: Chief Complaint  Patient presents with   Cory Palmer      Cory Drillinghomas B Richens is  a 61 y.o. male.   The history is provided by the patient and medical records. No language interpreter was used.  Fall This is a new problem. The current episode started less than 1 hour ago. The problem occurs rarely. Pertinent negatives include no chest pain, no abdominal pain, no headaches and no shortness of breath. Nothing aggravates the symptoms. Nothing relieves the symptoms. He has tried nothing for the  symptoms. The treatment provided no relief.  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    Past Surgical History:  Procedure Laterality Date   AMPUTATION Left 09/28/2018   Procedure: LEFT TRANSMETATARSAL AMPUTATION;  Surgeon: Nadara Mustard, MD;  Location: [facility] OR;  Service: Orthopedics;  Laterality: Left;   AMPUTATION Left 10/23/2019   Procedure: AMPUTATION BELOW KNEE;  Surgeon: Nadara Mustard, MD;  Location: [facility] OR;  Service: Orthopedics;  Laterality: Left;   BELOW KNEE LEG AMPUTATION Right    ESOPHAGOGASTRODUODENOSCOPY (EGD) WITH PROPOFOL N/A 10/19/2020   Procedure: ESOPHAGOGASTRODUODENOSCOPY (EGD) WITH PROPOFOL;  Surgeon: Charna Elizabeth, MD;  Location: [facility] ENDOSCOPY;  Service: Endoscopy;  Laterality: N/A;   TEE WITHOUT CARDIOVERSION N/A 10/25/2019   Procedure: TRANSESOPHAGEAL ECHOCARDIOGRAM (TEE);  Surgeon: Lewayne Bunting, MD;  Location: [facility] ENDOSCOPY;  Service: Cardiovascular;  Laterality: N/A;   Family History:  Family History  Problem Relation Age of Onset   Hypertension Other    Diabetes Mellitus II Other    Family Psychiatric  History: unknown Social History:  Social History   Substance and Sexual Activity  Alcohol Use Not Currently     Social History   Substance and Sexual Activity  Drug Use Not Currently   Types: Cocaine   Comment: Last used in July 2023    Social History   Socioeconomic History   Marital status: Single    Spouse name: Not on file    Number of children: Not on file   Years of education: Not on file   Highest education level: Not on file  Occupational History   Not on file  Tobacco Use   Smoking status: Every Day    Packs/day: 0.25    Types: Cigarettes   Smokeless tobacco: Never  Vaping Use   Vaping Use: Never used  Substance and Sexual Activity   Alcohol use: Not Currently   Drug use: Not Currently    Types: Cocaine    Comment: Last used in July 2023   Sexual activity: Not Currently  Other Topics Concern   Not on file  Social History Narrative   Not on file   Social Determinants of Health   Financial Resource Strain: Unknown (05/07/2018)   Overall Financial Resource Strain (CARDIA)    Difficulty of Paying Living Expenses: Patient refused  Food Insecurity: Unknown (05/07/2018)   Hunger Vital Sign    Worried About Running Out of Food in the Last Year: Patient refused    Ran Out of Food in the Last  Year: Patient refused  Transportation Needs: Unknown (05/07/2018)   PRAPARE - Administrator, Civil Service (Medical): Patient refused    Lack of Transportation (Non-Medical): Patient refused  Physical Activity: Unknown (05/07/2018)   Exercise Vital Sign    Days of Exercise per Week: Patient refused    Minutes of Exercise per Session: Patient refused  Stress: Not on file  Social Connections: Not on file   Additional Social History:    Allergies:  No Known Allergies  Labs:  Results for orders placed or performed during the hospital encounter of 04/09/22 (from the past 48 hour(s))  Comprehensive metabolic panel     Status: Abnormal   Collection Time: 04/09/22  7:39 PM  Result Value Ref Range   Sodium 137 135 - 145 mmol/L   Potassium 3.0 (L) 3.5 - 5.1 mmol/L   Chloride 107 98 - 111 mmol/L   CO2 18 (L) 22 - 32 mmol/L   Glucose, Bld 111 (H) 70 - 99 mg/dL    Comment: Glucose reference range applies only to samples taken after fasting for at least 8 hours.   BUN 10 8 - 23 mg/dL   Creatinine, Ser  4.01 0.61 - 1.24 mg/dL   Calcium 8.4 (L) 8.9 - 10.3 mg/dL   Total Protein 5.7 (L) 6.5 - 8.1 g/dL   Albumin 3.0 (L) 3.5 - 5.0 g/dL   AST 31 15 - 41 U/L   ALT 16 0 - 44 U/L   Alkaline Phosphatase 73 38 - 126 U/L   Total Bilirubin 1.2 0.3 - 1.2 mg/dL   GFR, Estimated >02 >72 mL/min    Comment: (NOTE) Calculated using the CKD-EPI Creatinine Equation (2021)    Anion gap 12 5 - 15    Comment: Performed at [facility], [address] [place], [place] [address]  Ethanol     Status: Abnormal   Collection Time: 04/09/22  7:39 PM  Result Value Ref Range   Alcohol, Ethyl (B) 239 (H) <10 mg/dL    Comment: (NOTE) Lowest detectable limit for serum alcohol is 10 mg/dL.  For medical purposes only. Performed at [facility], [address] [place], [place] [address]   CBC with Diff     Status: Abnormal   Collection Time: 04/09/22  7:39 PM  Result Value Ref Range   WBC 6.7 4.0 - 10.5 K/uL   RBC 3.48 (L) 4.22 - 5.81 MIL/uL   Hemoglobin 11.8 (L) 13.0 - 17.0 g/dL   HCT 42.5 (L) 95.6 - 38.7 %   MCV 100.0 80.0 - 100.0 fL   MCH 33.9 26.0 - 34.0 pg   MCHC 33.9 30.0 - 36.0 g/dL   RDW 56.4 33.2 - 95.1 %   Platelets 120 (L) 150 - 400 K/uL   nRBC 0.0 0.0 - 0.2 %   Neutrophils Relative % 54 %   Neutro Abs 3.6 1.7 - 7.7 K/uL   Lymphocytes Relative 34 %   Lymphs Abs 2.3 0.7 - 4.0 K/uL   Monocytes Relative 7 %   Monocytes Absolute 0.5 0.1 - 1.0 K/uL   Eosinophils Relative 4 %   Eosinophils Absolute 0.3 0.0 - 0.5 K/uL   Basophils Relative 1 %   Basophils Absolute 0.1 0.0 - 0.1 K/uL   Immature Granulocytes 0 %   Abs Immature Granulocytes 0.01 0.00 - 0.07 K/uL    Comment: Performed at [facility], [address], [place], [place] [address]  Urine rapid drug screen (  hosp performed)     Status: Abnormal   Collection Time: 04/09/22  9:06 PM  Result Value Ref Range   Opiates NONE DETECTED NONE DETECTED   Cocaine POSITIVE (A) NONE DETECTED   Benzodiazepines NONE DETECTED NONE  DETECTED   Amphetamines NONE DETECTED NONE DETECTED   Tetrahydrocannabinol NONE DETECTED NONE DETECTED   Barbiturates NONE DETECTED NONE DETECTED    Comment: (NOTE) DRUG SCREEN FOR MEDICAL PURPOSES ONLY.  IF CONFIRMATION IS NEEDED FOR ANY PURPOSE, NOTIFY LAB WITHIN 5 DAYS.  LOWEST DETECTABLE LIMITS FOR URINE DRUG SCREEN Drug Class                     Cutoff (ng/mL) Amphetamine and metabolites    1000 Barbiturate and metabolites    200 Benzodiazepine                 200 Tricyclics and metabolites     300 Opiates and metabolites        300 Cocaine and metabolites        300 THC                            50 Performed at [facility], [address] [place], [place] [address]   Resp Panel by RT-PCR (Flu A&B, Covid) Anterior Nasal Swab     Status: None   Collection Time: 04/09/22 11:11 PM   Specimen: Anterior Nasal Swab  Result Value Ref Range   SARS Coronavirus 2 by RT PCR NEGATIVE NEGATIVE    Comment: (NOTE) SARS-CoV-2 target nucleic acids are NOT DETECTED.  The SARS-CoV-2 RNA is generally detectable in upper respiratory specimens during the acute phase of infection. The lowest concentration of SARS-CoV-2 viral copies this assay can detect is 138 copies/mL. A negative result does not preclude SARS-Cov-2 infection and should not be used as the sole basis for treatment or other patient management decisions. A negative result may occur with  improper specimen collection/handling, submission of specimen other than nasopharyngeal swab, presence of viral mutation(s) within the areas targeted by this assay, and inadequate number of viral copies(<138 copies/mL). A negative result must be combined with clinical observations, patient history, and epidemiological information. The expected result is Negative.  Fact Sheet for Patients:  BloggerCourse.com  Fact Sheet for Healthcare Providers:  SeriousBroker.it  This test is no  t yet approved or cleared by the [place] FDA and  has been authorized for detection and/or diagnosis of SARS-CoV-2 by FDA under an Emergency Use Authorization (EUA). This EUA will remain  in effect (meaning this test can be used) for the duration of the COVID-19 declaration under Section 564(b)(1) of the Act, 21 U.S.C.section 360bbb-3(b)(1), unless the authorization is terminated  or revoked sooner.       Influenza A by PCR NEGATIVE NEGATIVE   Influenza B by PCR NEGATIVE NEGATIVE    Comment: (NOTE) The Xpert Xpress SARS-CoV-2/FLU/RSV plus assay is intended as an aid in the diagnosis of influenza from Nasopharyngeal swab specimens and should not be used as a sole basis for treatment. Nasal washings and aspirates are unacceptable for Xpert Xpress SARS-CoV-2/FLU/RSV testing.  Fact Sheet for Patients: BloggerCourse.com  Fact Sheet for Healthcare Providers: SeriousBroker.it  This test is not yet approved or cleared by the [place] FDA and has been authorized for detection and/or diagnosis of SARS-CoV-2 by FDA under an Emergency Use Authorization (EUA). This EUA will remain in effect (meaning this test  can be used) for the duration of the COVID-19 declaration under Section 564(b)(1) of the Act, 21 U.S.C. section 360bbb-3(b)(1), unless the authorization is terminated or revoked.  Performed at [facility], [address], [place], [place] [address]     Medications:  No current facility-administered medications for this encounter.   Current Outpatient Medications  Medication Sig Dispense Refill   ciprofloxacin (CIPRO) 500 MG tablet Take 1 tablet (500 mg total) by mouth 2 (two) times daily. (Patient not taking: Reported on 04/01/2022) 14 tablet 0   metFORMIN (GLUCOPHAGE) 1000 MG tablet Take 1 tablet (1,000 mg total) by mouth 2 (two) times daily with a meal. 60 tablet 0   sertraline (ZOLOFT) 50 MG tablet Take 1 tablet  (50 mg total) by mouth daily. 30 tablet 2    Musculoskeletal: pt moves all extremities; has below the knee amputations, states he ambulates with a wheel chair.  Strength & Muscle Tone: within normal limits Gait & Station:  pt uses wheel chair Patient leans: N/A    Psychiatric Specialty Exam:  Presentation  General Appearance: Disheveled  Eye Contact:Good  Speech:Clear and Coherent; Normal Rate  Speech Volume:Normal  Handedness:Right   Mood and Affect  Mood:Depressed; Anxious  Affect:Congruent   Thought Process  Thought Processes:Coherent; Goal Directed; Linear  Descriptions of Associations:Intact  Orientation:Full (Time, Place and Person)  Thought Content:Logical  History of Schizophrenia/Schizoaffective disorder:No  Duration of Psychotic Symptoms:Greater than six months  Hallucinations:No data recorded Ideas of Reference:None  Suicidal Thoughts:No data recorded Homicidal Thoughts:No data recorded  Sensorium  Memory:Immediate Fair; Recent Fair  Judgment:Intact  Insight:Shallow   Executive Functions  Concentration:Fair  Attention Span:Fair  Recall:Fair  Fund of Knowledge:Fair  Language:Fair   Psychomotor Activity  Psychomotor Activity:No data recorded  Assets  Assets:Desire for Improvement; Communication Skills; Financial Resources/Insurance   Sleep  Sleep:No data recorded   Physical Exam: Physical Exam Cardiovascular:     Rate and Rhythm: Normal rate.     Pulses: Normal pulses.  Pulmonary:     Effort: Pulmonary effort is normal.  Musculoskeletal:     Cervical back: Normal range of motion.  Neurological:     Mental Status: He is alert and oriented to person, place, and time. Mental status is at baseline.  Psychiatric:        Attention and Perception: Attention and perception normal.        Mood and Affect: Mood is anxious.        Speech: Speech normal.        Behavior: Behavior is hyperactive. Behavior is cooperative.         Thought Content: Thought content includes suicidal (passive suicidal ideations, no plan or intent verbalized) ideation.        Cognition and Memory: Cognition and memory normal.        Judgment: Judgment is impulsive (as evidenced by chronic drug and alcohol use).    Review of Systems  Constitutional: Negative.   HENT: Negative.    Eyes: Negative.   Respiratory: Negative.    Cardiovascular: Negative.   Gastrointestinal: Negative.   Genitourinary: Negative.   Skin: Negative.   Neurological:  Positive for tremors, sensory change, speech change and seizures.  Endo/Heme/Allergies: Negative.   Psychiatric/Behavioral:  Positive for substance abuse and suicidal ideas (paddive). The patient is nervous/anxious.    Blood pressure 90/66, pulse 80, temperature 98.2 F (36.8 C), temperature source Oral, resp. rate 18, SpO2 94 %. There is no height or weight on file to calculate BMI.  Treatment Plan Summary: Pt has suicidal ideations, no plan or intent for self harm.  Reports his symptoms are triggered by recent homelessness; his presentation is complicated by alcohol and cocaine abuse.   Pt has mental hx dx of Bipolar, Depression, Anxiety, Panic Attacks, Insomnia, and has been off medications or an indeterminate period of time. Considering this, patient does not appear mentally decompensated.  He is alert and oriented and clear and coherent, denies AVH and does not appear to be responding to internal stimulus.   As per above, patient does not meet criteria for involuntary psychiatric admissions and states he is not interested in voluntary admissions.  He is psych cleared and recommended he follow up with outpatient resources added to discharge AVS.    We reviewed the importance of substance abuse abstinence; potential negative impact substance abuse can have on relationships and level of functioning.  Also reviewed the importance of medication compliance and the need to follow-up with [facility] for  medication mgmt and therapy.  TOC consult entered for resources for homelessness, SUD and care coordination.      Disposition: No evidence of imminent risk to self or others at present.   Patient does not meet criteria for psychiatric inpatient admission. Supportive therapy provided about ongoing stressors. Discussed crisis plan, support from social network, calling 911, coming to the Emergency Department, and calling Suicide Hotline.  This service was provided via telemedicine using a 2-way, interactive audio and video technology.  Names of all persons participating in this telemedicine service and their role in this encounter. Name: Cory Drilling Role: Patient  Name: Ophelia Shoulder Role: PMHNP    Chales Abrahams, NP 04/10/2022 1:16 PM

## 2022-04-10 NOTE — Social Work (Signed)
CSW added shelters, OP as well residential care for substance abuse.

## 2022-04-10 NOTE — ED Notes (Signed)
Linen changed the pt keeps c/o itching all over his body

## 2022-04-11 MED ORDER — POTASSIUM CHLORIDE CRYS ER 20 MEQ PO TBCR: 20.0000 meq | EXTENDED_RELEASE_TABLET | Freq: Two times a day (BID) | ORAL | 0 refills | Status: DC

## 2022-04-11 NOTE — ED Notes (Signed)
Breakfast order placed ?

## 2022-04-12 ENCOUNTER — Telehealth: Payer: Self-pay | Admitting: Licensed Clinical Social Worker
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
## 2022-04-12 NOTE — Telephone Encounter (Signed)
noted 

## 2022-04-13 ENCOUNTER — Encounter: Payer: Self-pay | Admitting: *Deleted

## 2022-04-13 NOTE — Congregational Nurse Program (Signed)
  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 Yes    Location Patient Served  [facility]   [facility] or [address]Phone/Text/Email    Patient Status Homeless    Insurance [employer] Insurance Referral N/A    Medication Have Medication Insecurities;Provided Medication Assistance    Medical Provider Yes    Screening Referrals N/A    Medical Referral N/A    Medical Appointment Made N/A    Food N/A    Transportation N/A    Housing/Utilities No permanent housing    Interpersonal Safety N/A    Intervention Support    ED Visit Averted N/A    Life-Saving Intervention Made N/A           Called client with temporary phone number located in [place]. Client is located at [employer] beside [employer]. He had medication at [facility] that was delivered from [employer]. Writer delivered medication to client at [employer].  Kijuana Ruppel W RN CN

## 2022-04-18 ENCOUNTER — Telehealth: Payer: Self-pay | Admitting: Orthopedic Surgery

## 2022-04-18 NOTE — Telephone Encounter (Signed)
patient

## 2022-04-18 NOTE — Telephone Encounter (Signed)
Patient need a referral sent to [facility] he states that something is going on with his insurance  this is what the [facility] is telling him please call the patient to advise or call the [facility] for the patient please.  The best number to reach the patient [phone]

## 2022-04-19 NOTE — Telephone Encounter (Signed)
Message sent to [facility] to ask what the pt is needing and asking how can we help. I will hold this message pending a response from [facility].

## 2022-04-20 ENCOUNTER — Other Ambulatory Visit (HOSPITAL_COMMUNITY): Payer: Self-pay

## 2022-04-20 ENCOUNTER — Other Ambulatory Visit: Payer: Self-pay | Admitting: Critical Care Medicine

## 2022-04-20 ENCOUNTER — Encounter: Payer: Self-pay | Admitting: Critical Care Medicine

## 2022-04-20 DIAGNOSIS — F320 Major depressive disorder, single episode, mild: Secondary | ICD-10-CM

## 2022-04-20 DIAGNOSIS — F411 Generalized anxiety disorder: Secondary | ICD-10-CM

## 2022-04-20 MED ORDER — QUETIAPINE FUMARATE 300 MG PO TABS: 600.0000 mg | ORAL_TABLET | Freq: Every day | ORAL | Status: DC

## 2022-04-20 MED ORDER — SERTRALINE HCL 50 MG PO TABS
50.0000 mg | ORAL_TABLET | Freq: Every day | ORAL | 2 refills | Status: DC
  Filled 2022-04-20 – 2022-04-22 (×3): qty 90, 90d supply, fill #0

## 2022-04-20 NOTE — Telephone Encounter (Signed)
Sending note to [facility]

## 2022-04-20 NOTE — Telephone Encounter (Signed)
Can you please add the 5 tenants to the note for 03/29/22 the pt has an appt with [facility] on 04/26/22 and to proceed with prosthetic will need this added to dictation.

## 2022-04-21 ENCOUNTER — Other Ambulatory Visit (HOSPITAL_COMMUNITY): Payer: Self-pay

## 2022-04-21 ENCOUNTER — Telehealth: Payer: Self-pay

## 2022-04-21 ENCOUNTER — Encounter: Payer: Self-pay | Admitting: *Deleted

## 2022-04-21 ENCOUNTER — Telehealth: Payer: Self-pay | Admitting: Critical Care Medicine

## 2022-04-21 DIAGNOSIS — E876 Hypokalemia: Secondary | ICD-10-CM

## 2022-04-21 NOTE — Telephone Encounter (Signed)
Cory Palmer- I spoke to Dr Joya Gaskins and understand that the patient wants to receive his care at [facility].  He has an appointment with Dr Wynetta Emery - 05/16/2022 but in the meantime, Dr Joya Gaskins is placing orders for lab work.  Please let me know if you need assistance with arranging transportation to the clinic next week for the labs.

## 2022-04-21 NOTE — Congregational Nurse Program (Signed)
Pt has mobility issues, wh/ch is not appropriate for his size and is very worn and torn Also has med issues, is not taking appropriate meds, quetiapine was filled in aug 2023 and is not appropriate now for pt's cardiac/ vascular problems. This is discontinued and taken to be destroyed by provider. Zoloft will start Appt 10/16 w/ dr [facility]etta Emery at [facility], will arrange transportation w/ Tomi Bamberger, case worker at [facility]

## 2022-04-21 NOTE — Telephone Encounter (Signed)
The BMP order is placed for future  Helen pls try to get him in for a lab visit  Carly: pls partner with Bonnita Nasuti on this phone msg to coordinate the [facility] corp in office lab draw for bmp  Yes Opal Sidles he should keep his appt with Dr Wynetta Emery, [facility] [address] never saw the patietn

## 2022-04-21 NOTE — Telephone Encounter (Signed)
I spoke to [place] at [place]. She explained that the patient was seen there for a welcome visit on 08/17/2021 and then again on 10/25/2021. They tried to contact him multiple times since then and have not been able to reach him. He was a no show for his appointment last week- 04/12/2022.
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
## 2022-04-22 ENCOUNTER — Other Ambulatory Visit (HOSPITAL_COMMUNITY): Payer: Self-pay

## 2022-04-27 ENCOUNTER — Other Ambulatory Visit: Payer: Self-pay | Admitting: Physician Assistant

## 2022-04-27 ENCOUNTER — Encounter: Payer: Self-pay | Admitting: Physician Assistant

## 2022-04-27 ENCOUNTER — Other Ambulatory Visit (HOSPITAL_COMMUNITY): Payer: Self-pay

## 2022-04-27 MED ORDER — AMLODIPINE BESYLATE 5 MG PO TABS
5.0000 mg | ORAL_TABLET | Freq: Every day | ORAL | 11 refills | Status: DC
  Filled 2022-04-27: qty 30, 30d supply, fill #0

## 2022-04-27 MED ORDER — CARVEDILOL 6.25 MG PO TABS
6.2500 mg | ORAL_TABLET | Freq: Two times a day (BID) | ORAL | 11 refills | Status: DC
  Filled 2022-04-27: qty 60, 30d supply, fill #0

## 2022-04-27 NOTE — Progress Notes (Cosign Needed Addendum)
61 yo with hx bilateral BKA, HTN, atrial fibrillation, cirrhosis, and multiple other medical issues.  Here with concern that he's been off seroquel and trazodone.  The zoloft was intended to be continued, but he never picked it up.  Discussed trouble sleeping.  Notes intrusive thoughts.  What sound like passive SI, but no plan, doesn't feel like he'd go through with it.     Today's Vitals   04/27/22 1508  BP: (!) 159/99  Pulse: 88  SpO2: 100%   There is no height or weight on file to calculate BMI.  AP Continue zoloft.  Needs to follow with PCP outpatient for repeat EKG and consideration of resuming seroquel/trazodone based on Qtc.  Check in with social worker until symptoms improve.  HTN: will start coreg/amlodipine  Needs to follow up consistently with 1 doctor, he's willing to try to do that  ----------------------------------------------------  Pt seen by Dr Florene Glen.   Pt was taken off seroquel last week.   He has been under a great deal of stress, having negative thoughts, has thought about harming himself, but no definite plans.   Has seen Dr [name] in the past, would not mind seeing her again. However, he has an appt on 10/16 with Dr Karle Plumber.   He has been in the shelter for about 3 weeks.   He has metformin and pravastatin. He was started on Coreg 6.25 mg bid and amlodipine 5 mg qd. These were sent in to [facility]. He has insurance, will need to pay the copays.   We discussed that the seroquel was stopped because of prolonged QT.   The Zoloft was sent in but was delayed due to prior auth >> is ready now.   He has not slept and has been very agitated.   Today's Vitals   04/27/22 1508  BP: (!) 159/99  Pulse: 88  SpO2: 100%   There is no height or weight on file to calculate BMI.  Rosaria Ferries, PA-C 04/27/2022 3:32 PM

## 2022-04-27 NOTE — Telephone Encounter (Signed)
noted 

## 2022-04-28 ENCOUNTER — Other Ambulatory Visit: Payer: Self-pay

## 2022-04-28 NOTE — Telephone Encounter (Signed)
I spoke to Freddy Finner, [place] and she will arrange transportation for the patient to come to [facility] this afternoon for lab work

## 2022-04-29 NOTE — Telephone Encounter (Signed)
Noted  

## 2022-05-02 NOTE — Progress Notes (Signed)
In response to coding query, I reviewed my chart and documented that patient had suicidal thoughts, homicidal thoughts, and a plan to use a firearm that he has in a gun safe to hurt others.  This is why I requested psychiatric evaluation.

## 2022-05-04 ENCOUNTER — Other Ambulatory Visit (HOSPITAL_COMMUNITY): Payer: Self-pay

## 2022-05-04 ENCOUNTER — Other Ambulatory Visit: Payer: Self-pay | Admitting: Critical Care Medicine

## 2022-05-04 ENCOUNTER — Encounter: Payer: Self-pay | Admitting: Physician Assistant

## 2022-05-04 DIAGNOSIS — G894 Chronic pain syndrome: Secondary | ICD-10-CM

## 2022-05-04 DIAGNOSIS — F411 Generalized anxiety disorder: Secondary | ICD-10-CM

## 2022-05-04 DIAGNOSIS — F320 Major depressive disorder, single episode, mild: Secondary | ICD-10-CM

## 2022-05-04 DIAGNOSIS — E119 Type 2 diabetes mellitus without complications: Secondary | ICD-10-CM

## 2022-05-04 MED ORDER — CARVEDILOL 6.25 MG PO TABS
6.2500 mg | ORAL_TABLET | Freq: Two times a day (BID) | ORAL | 11 refills | Status: DC
  Filled 2022-05-04: qty 60, 30d supply, fill #0

## 2022-05-04 MED ORDER — PRAVASTATIN SODIUM 40 MG PO TABS
40.0000 mg | ORAL_TABLET | Freq: Every day | ORAL | 3 refills | Status: DC
  Filled 2022-05-04: qty 30, 30d supply, fill #0
  Filled 2022-08-22: qty 30, 30d supply, fill #1

## 2022-05-04 MED ORDER — SERTRALINE HCL 50 MG PO TABS
50.0000 mg | ORAL_TABLET | Freq: Every day | ORAL | 2 refills | Status: DC
  Filled 2022-05-04: qty 90, 90d supply, fill #0
  Filled 2022-08-22: qty 90, 90d supply, fill #1

## 2022-05-04 MED ORDER — AMLODIPINE BESYLATE 5 MG PO TABS
5.0000 mg | ORAL_TABLET | Freq: Every day | ORAL | 11 refills | Status: DC
  Filled 2022-05-04: qty 30, 30d supply, fill #0
  Filled 2022-08-22: qty 30, 30d supply, fill #1

## 2022-05-04 NOTE — Telephone Encounter (Signed)
Patient did not come to clinic to have labs done last week. I spoke to [facility] and she said a staff member from [employer] would make sure he gets to the lab today.
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
## 2022-05-05 ENCOUNTER — Other Ambulatory Visit: Payer: Medicare Other

## 2022-05-05 NOTE — Telephone Encounter (Signed)
FYI- Patient did not come to clinic for labs yesterday and has not come today.

## 2022-05-05 NOTE — Telephone Encounter (Signed)
Long story  we[facility] arranged [employer] lyft and pt refused the transport wants a wheelchair Lucianne Lei and refused transport.   He will need to reschedule an I suggest he get labs when he sees dr Dorthula Rue   He is a double amputee will [employer] provide him wheelchair SCAT bus style transprot

## 2022-05-06 ENCOUNTER — Encounter: Payer: Self-pay | Admitting: *Deleted

## 2022-05-06 NOTE — Congregational Nurse Program (Signed)
Dr wright saw pt Pillbox complete 

## 2022-05-10 ENCOUNTER — Telehealth: Payer: Self-pay | Admitting: Emergency Medicine

## 2022-05-10 DIAGNOSIS — Z89511 Acquired absence of right leg below knee: Secondary | ICD-10-CM

## 2022-05-10 NOTE — Telephone Encounter (Signed)
Copied from [place] [id number]. Topic: General - Other >> May 10, 2022 10:52 AM Leilani Able wrote: Reason for CRM: Pt wants to get a message to Dr Lenna Sciara as his wheelchair is very hard to move as a wheel is bent on it and it is his only means of transportation due to his still not having prosthesis for his legs. He has an appt for nxt week but the wheelchair issue is a huge concern. Pls reach out to pt at [phone]

## 2022-05-11 ENCOUNTER — Telehealth: Payer: Self-pay | Admitting: Internal Medicine

## 2022-05-11 NOTE — Telephone Encounter (Signed)
Please advise.----DD,RMA  

## 2022-05-11 NOTE — Addendum Note (Signed)
Addended by: Karle Plumber B on: 05/11/2022 04:59 PM   Modules accepted: Orders

## 2022-05-11 NOTE — Telephone Encounter (Signed)
Pt called requesting an electric wheelchair, he says that he is eligible and wants to know what to do to get one. He says he has been waiting for a prosthetic leg for five years with the current referral.   Best contact: [phone]

## 2022-05-11 NOTE — Telephone Encounter (Signed)
Routing to CM 

## 2022-05-15 ENCOUNTER — Other Ambulatory Visit: Payer: Self-pay | Admitting: *Deleted

## 2022-05-16 ENCOUNTER — Ambulatory Visit: Payer: Medicare Other | Attending: Internal Medicine | Admitting: Internal Medicine

## 2022-05-16 ENCOUNTER — Encounter: Payer: Self-pay | Admitting: Internal Medicine

## 2022-05-16 VITALS — BP 100/80 | HR 94 | Temp 98.7°F

## 2022-05-16 DIAGNOSIS — E119 Type 2 diabetes mellitus without complications: Secondary | ICD-10-CM | POA: Diagnosis not present

## 2022-05-16 DIAGNOSIS — Z89512 Acquired absence of left leg below knee: Secondary | ICD-10-CM

## 2022-05-16 DIAGNOSIS — Z23 Encounter for immunization: Secondary | ICD-10-CM | POA: Diagnosis not present

## 2022-05-16 DIAGNOSIS — E1159 Type 2 diabetes mellitus with other circulatory complications: Secondary | ICD-10-CM

## 2022-05-16 DIAGNOSIS — Z89511 Acquired absence of right leg below knee: Secondary | ICD-10-CM

## 2022-05-16 DIAGNOSIS — R197 Diarrhea, unspecified: Secondary | ICD-10-CM | POA: Diagnosis not present

## 2022-05-16 DIAGNOSIS — I152 Hypertension secondary to endocrine disorders: Secondary | ICD-10-CM

## 2022-05-16 DIAGNOSIS — F99 Mental disorder, not otherwise specified: Secondary | ICD-10-CM

## 2022-05-16 DIAGNOSIS — F5105 Insomnia due to other mental disorder: Secondary | ICD-10-CM

## 2022-05-16 DIAGNOSIS — Z1211 Encounter for screening for malignant neoplasm of colon: Secondary | ICD-10-CM

## 2022-05-16 LAB — POCT GLYCOSYLATED HEMOGLOBIN (HGB A1C): HbA1c, POC (controlled diabetic range): 6.2 % (ref 0.0–7.0)

## 2022-05-16 LAB — GLUCOSE, POCT (MANUAL RESULT ENTRY): POC Glucose: 143 mg/dl — AB (ref 70–99)

## 2022-05-16 MED ORDER — LOPERAMIDE HCL 2 MG PO CAPS: 2.0000 mg | ORAL_CAPSULE | Freq: Three times a day (TID) | ORAL | 0 refills | Status: DC | PRN

## 2022-05-16 MED ORDER — METOPROLOL SUCCINATE ER 25 MG PO TB24: 12.5000 mg | ORAL_TABLET | Freq: Every day | ORAL | 3 refills | Status: DC

## 2022-05-16 MED ORDER — ZOSTER VAC RECOMB ADJUVANTED 50 MCG/0.5ML IM SUSR: 0.5000 mL | Freq: Once | INTRAMUSCULAR | 0 refills | Status: AC

## 2022-05-16 MED ORDER — HYDROXYZINE PAMOATE 25 MG PO CAPS: 25.0000 mg | ORAL_CAPSULE | Freq: Every day | ORAL | 1 refills | Status: DC

## 2022-05-16 NOTE — Patient Instructions (Signed)
Please try to give a stool sample today to the lab before you leave.  I have sent a prescription for Imodium to your pharmacy to use as needed for the diarrhea.  Try to drink several glasses of water during the day to keep yourself hydrated.  I have given the prescription for the shingles vaccine.  You can take it to any outside pharmacy to be given the vaccine.  Stop carvedilol.  We have changed it to a once daily dosing medication called Toprol instead.

## 2022-05-16 NOTE — Progress Notes (Signed)
Patient ID: Cory Palmer, male    DOB: [birth year]  MRN: [id number]  CC: Diabetes (DM f/u. Not sleeping X2 weeks, diarrhea X1 week. Inquiring for a new wheelchair due to wear & tear. [name] prost[employer]./Already received flu vax this season.)   Subjective: Cory Palmer is a 61 y.o. male who presents for 6 wks f/u His concerns today include:  Patient with history of HTN, DM with polyneuropathy, Tob dep,  BL BKA, PAF not on anticoagulant d/t cirrhosis and thrombocytopenia, EtOH cirrhosis, protein malnutrition, bipolar disorder, QT prolongation, homelessness  BL BKA: has appt with [facility] tomorrow at 1 p.m.  He hopes this is to try the new prosthetic legs.  Reports he has been working on getting this for 5 yrs.  Had seen them before when they were [employer]. Needs new WC.  Current one he has had for over 1 yr.  LT wheel is bent and mental piece on RT side sticks into thigh. Feels he would do better with one with cushion.   Would prefer PWC.  Feels it would be easer for him getting up and down the street.  Wheel on current manual WC bent and does not maneuver straight.  He has no problem self-propelling in the wheelchair. Currently staying at [facility].  CW has filled out form for him to get another apartment but not sure how long it will take.  DM: Lab Results  Component Value Date   HGBA1C 6.2 05/16/2022  -still taking Metformin 1000 mg once a day.  Rxn says BID Gets meals at [facility]. Referred for eye exam last visit.  No appt as yet. I have confirmed his cell phone # in Epic is correct  HTN: did not bring meds with him today and does not recall meds except Coreg.  Reports nurse from shelter use to fill med box but now he takes his meds directly out of the bottles.  Meds left in tote bag on his bed. Did not take meds as yet for morning.  Reports he takes all meds only in nights because they are not allowed back in the shelter until 6 p.m  Has pair of dentures already for pick  up.  Reports having diarrhea for past 10 days, going about 6x/day.  Diagnosed with enteropathic E. coli GI infection with diarrhea in August of this year during hospitalization.   No blood in the stools.  Reports problem sleeping at nights since being off Seroquil and Trazodone.  These were stopped during last hospitalization due to QT prolongation thought to have been complicated by malnutrition and electrolyte disturbances.  Gives hx of dep/anx and bipolar.  Use to go to [facility] but not seen in 3 yrs. Referred to [facility] on last visit.  Was called 2x.  VMB not set up.    HM:  due for Tdapt and Shingles vaccine.  Due for colon CA screening.  Reports having had c-scope many years ago in [place]      Patient Active Problem List   Diagnosis Date Noted   Alcohol abuse 04/10/2022   Cocaine abuse with cocaine-induced mood disorder ([place]) 04/10/2022   Adjustment disorder with mixed anxiety and depressed mood 04/10/2022   Alcohol-induced mood disorder ([place]) 04/10/2022   Severe sepsis ([place]) 03/24/2022   Abdominal pain 03/24/2022   Nausea vomiting and diarrhea 03/24/2022   Prolonged QT interval 03/24/2022   Macrocytosis without anemia 03/24/2022   Sepsis ([place]) 03/24/2022   Acute hepatic encephalopathy ([place]) 11/17/2021   PVD (peripheral  vascular disease) ([place]) 57/84/6962   Metabolic acidosis 95/28/4132   Esophagitis 44/08/270   Acute metabolic encephalopathy 53/66/4403   Thrombocytopenia ([place]) 11/11/2021   Leukocytosis 10/10/2020   GERD (gastroesophageal reflux disease) 10/10/2020   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[place]) 05/06/2020   Hyponatremia 10/23/2019   MRSA bacteremia 10/21/2019   Atrial flutter with rapid ventricular response ([place]) 10/21/2019   Lactic acidosis 10/21/2019   Severe protein-calorie malnutrition ([place])    Diabetic polyneuropathy associated with type 2 diabetes mellitus ([place])    Cirrhosis of liver ([place]) 10/19/2019   Anemia 10/19/2019   Medication monitoring  encounter 08/15/2019   Serum total bilirubin elevated 10/29/2018   BPH (benign prostatic hyperplasia) 10/29/2018   Below-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)    PAF (paroxysmal atrial fibrillation) ([place])    Hypokalemia    Hypomagnesemia    Sepsis without acute organ dysfunction ([place]) 10/05/2018   Depressed bipolar disorder ([place]) 10/05/2018   Hypertension 10/05/2018   Hyperbilirubinemia 10/05/2018   Homelessness 05/07/2018   Diabetes mellitus type II, non insulin dependent ([place]) 05/07/2018     Current Outpatient Medications on File Prior to Visit  Medication Sig Dispense Refill   amLODipine (NORVASC) 5 MG tablet Take 1 tablet (5 mg total) by mouth daily. 30 tablet 11   metFORMIN (GLUCOPHAGE) 1000 MG tablet Take 1 tablet (1,000 mg total) by mouth 2 (two) times daily with a meal. 60 tablet 0   pravastatin (PRAVACHOL) 40 MG tablet Take 1 tablet (40 mg total) by mouth daily. 30 tablet 3   sertraline (ZOLOFT) 50 MG tablet Take 1 tablet (50 mg total) by mouth daily. 90 tablet 2   No current facility-administered medications on file prior to visit.    No Known Allergies  Social History   Socioeconomic History   Marital status: Single    Spouse name: Not on file   Number of children: Not on file   Years of education: Not on file   Highest education level: Not on file  Occupational History   Not on file  Tobacco Use   Smoking status: Every Day    Packs/day: 0.25    Types: Cigarettes   Smokeless tobacco: Never  Vaping Use   Vaping Use: Never used  Substance and Sexual Activity   Alcohol use: Not Currently   Drug use: Not Currently    Types: Cocaine    Comment: Last used in July 2023   Sexual activity: Not Currently  Other Topics Concern   Not on file  Social History Narrative   Not on file   Social Determinants of Health   Financial Resource Strain: Unknown (05/07/2018)   Overall Financial Resource Strain (CARDIA)    Difficulty of Paying Living Expenses:  Patient refused  Food Insecurity: Unknown (05/07/2018)   Hunger Vital Sign    Worried About Running Out of Food in the Last Year: Patient refused    [place] in the Last Year: Patient refused  Transportation Needs: Unknown (05/07/2018)   PRAPARE - Hydrologist (Medical): Patient refused    Lack of Transportation (Non-Medical): Patient refused  Physical Activity: Unknown (05/07/2018)   Exercise Vital Sign    Days of Exercise per Week: Patient refused    Minutes of Exercise per Session: Patient refused  Stress: Not on file  Social Connections: Not on file  Intimate Partner Violence: Not on file    Family History  Problem Relation Age of Onset   Hypertension  Other    Diabetes Mellitus II Other     Past Surgical History:  Procedure Laterality Date   AMPUTATION Left 09/28/2018   Procedure: LEFT TRANSMETATARSAL AMPUTATION;  Surgeon: Newt Minion, MD;  Location: [facility];  Service: Orthopedics;  Laterality: Left;   AMPUTATION Left 10/23/2019   Procedure: AMPUTATION BELOW KNEE;  Surgeon: Newt Minion, MD;  Location: [facility];  Service: Orthopedics;  Laterality: Left;   BELOW KNEE LEG AMPUTATION Right    ESOPHAGOGASTRODUODENOSCOPY (EGD) WITH PROPOFOL N/A 10/19/2020   Procedure: ESOPHAGOGASTRODUODENOSCOPY (EGD) WITH PROPOFOL;  Surgeon: Juanita Craver, MD;  Location: [facility] ENDOSCOPY;  Service: Endoscopy;  Laterality: N/A;   TEE WITHOUT CARDIOVERSION N/A 10/25/2019   Procedure: TRANSESOPHAGEAL ECHOCARDIOGRAM (TEE);  Surgeon: Lelon Perla, MD;  Location: [facility] ENDOSCOPY;  Service: Cardiovascular;  Laterality: N/A;    ROS: Review of Systems Negative except as stated above  PHYSICAL EXAM: BP 100/80   Pulse 94   Temp 98.7 F (37.1 C) (Oral)   SpO2 98%   Physical Exam  General appearance - alert, well appearing, and in no distress Mental status - normal mood, behavior, speech, dress, motor activity, and thought processes Mouth - mucous membranes moist, pharynx  normal without lesions Neck - supple, no significant adenopathy Chest - clear to auscultation, no wheezes, rales or rhonchi, symmetric air entry Heart - normal rate, regular rhythm, normal S1, S2, no murmurs, rubs, clicks or gallops Extremities -patient has bilateral below the knee amputation.  No edema.  He is in wheelchair.  Left wheel is slightly bent.  He does have a metal piece protruding on the anterior portion of the right hand side of his seat that digs into the thigh.     05/16/2022    9:11 AM 05/16/2022    9:00 AM 08/05/2019    3:04 PM  Depression screen PHQ 2/9  Decreased Interest 2 2 0  Down, Depressed, Hopeless 2 2 0  PHQ - 2 Score 4 4 0  Altered sleeping 3 3   Tired, decreased energy 2 2   Change in appetite 1 1   Feeling bad or failure about yourself  1 1   Trouble concentrating 2 2   Moving slowly or fidgety/restless 2 2   Suicidal thoughts 1 1   PHQ-9 Score 16 16       05/16/2022    9:12 AM 05/16/2022    9:00 AM  GAD 7 : Generalized Anxiety Score  Nervous, Anxious, on Edge 2 2  Control/stop worrying 1 1  Worry too much - different things 1 1  Trouble relaxing 3 3  Restless 3 3  Easily annoyed or irritable 1 1  Afraid - awful might happen 1 1  Total GAD 7 Score 12 12        Latest Ref Rng & Units 04/09/2022    7:39 PM 03/29/2022    1:14 AM 03/28/2022    1:47 AM  CMP  Glucose 70 - 99 mg/dL 111  134  109   BUN 8 - 23 mg/dL 10  10  8    Creatinine 0.61 - 1.24 mg/dL 0.71  0.79  0.62   Sodium 135 - 145 mmol/L 137  134  137   Potassium 3.5 - 5.1 mmol/L 3.0  3.9  3.8   Chloride 98 - 111 mmol/L 107  104  108   CO2 22 - 32 mmol/L 18  21  23    Calcium 8.9 - 10.3 mg/dL 8.4  8.4  8.4  Total Protein 6.5 - 8.1 g/dL 5.7  5.8  5.4   Total Bilirubin 0.3 - 1.2 mg/dL 1.2  1.3  1.2   Alkaline Phos 38 - 126 U/L 73  94  86   AST 15 - 41 U/L 31  29  25    ALT 0 - 44 U/L 16  14  14     Lipid Panel  No results found for: "CHOL", "TRIG", "HDL", "CHOLHDL", "VLDL", "LDLCALC",  "LDLDIRECT"  CBC    Component Value Date/Time   WBC 6.7 04/09/2022 1939   RBC 3.48 (L) 04/09/2022 1939   HGB 11.8 (L) 04/09/2022 1939   HCT 34.8 (L) 04/09/2022 1939   PLT 120 (L) 04/09/2022 1939   MCV 100.0 04/09/2022 1939   MCH 33.9 04/09/2022 1939   MCHC 33.9 04/09/2022 1939   RDW 14.1 04/09/2022 1939   LYMPHSABS 2.3 04/09/2022 1939   MONOABS 0.5 04/09/2022 1939   EOSABS 0.3 04/09/2022 1939   BASOSABS 0.1 04/09/2022 1939   Results for orders placed or performed in visit on 05/16/22  POCT glucose (manual entry)  Result Value Ref Range   POC Glucose 143 (A) 70 - 99 mg/dl  POCT glycosylated hemoglobin (Hb A1C)  Result Value Ref Range   Hemoglobin A1C     HbA1c POC (<> result, manual entry)     HbA1c, POC (prediabetic range)     HbA1c, POC (controlled diabetic range) 6.2 0.0 - 7.0 %    ASSESSMENT AND PLAN:  1. Diabetes mellitus type II, non insulin dependent ([place]) At goal.  We will update metformin prescription to reflect what he is taking which is 1000 mg once a day not twice a day. - POCT glucose (manual entry) - POCT glycosylated hemoglobin (Hb A1C)  2. Hypertension associated with diabetes ([place]) Close to goal.  I have changed carvedilol to Toprol since he takes his medicines only once a day.  Continue amlodipine - Basic Metabolic Panel - metoprolol succinate (TOPROL-XL) 25 MG 24 hr tablet; Take 0.5 tablets (12.5 mg total) by mouth daily.  Dispense: 90 tablet; Refill: 3  3. S/P bilateral BKA (below knee amputation) ([place]) Advised that a power wheelchair is to allow for use in his living environment not so much as a vehicle to be used on the street.  Also I am not sure that his insurance will pay for him to get the prosthetic leg and power wheelchair at the same time.  Encouraged him to keep the appointment tomorrow with [facility] to see whether his prosthetic legs have come in.  Prescription given for manual wheelchair and requesting a cushion for the seat.  I do note that  the left we will is a bit bent. - DME Wheelchair manual  4. Acute diarrhea We will send stools for culture and C. difficile.  In the meantime I have sent prescription for Imodium to use as needed. - Basic Metabolic Panel - Clostridium Difficile by PCR([facility] only ) - Stool culture - loperamide (IMODIUM A-D) 2 MG capsule; Take 1 capsule (2 mg total) by mouth every 8 (eight) hours as needed for diarrhea or loose stools.  Dispense: 20 capsule; Refill: 0  5. Insomnia due to other mental disorder We will try him with low-dose of hydroxyzine to use as needed for insomnia.  Message sent to referral coordinator to see if she can help get him in with behavioral health.  They had tried calling him for appointment but his voicemail box was not set up. - hydrOXYzine (VISTARIL) 25  MG capsule; Take 1 capsule (25 mg total) by mouth at bedtime.  Dispense: 30 capsule; Refill: 1  6. Need for Tdap vaccination Given today.  7. Need for shingles vaccine Prescription given for him to take to his pharmacy to get the shingles shot. - Zoster Vaccine Adjuvanted [facility]) injection; Inject 0.5 mLs into the muscle once for 1 dose.  Dispense: 0.5 mL; Refill: 0  8. Screening for colon cancer - Ambulatory referral to Gastroenterology     Patient was given the opportunity to ask questions.  Patient verbalized understanding of the plan and was able to repeat key elements of the plan.   This documentation was completed using Radio producer.  Any transcriptional errors are unintentional.  Orders Placed This Encounter  Procedures   DME Wheelchair manual   Clostridium Difficile by PCR([facility] only )   Stool culture   Tdap vaccine greater than or equal to 7yo IM   Basic Metabolic Panel   Ambulatory referral to Gastroenterology   POCT glucose (manual entry)   POCT glycosylated hemoglobin (Hb A1C)     Requested Prescriptions   Signed Prescriptions Disp Refills   hydrOXYzine (VISTARIL)  25 MG capsule 30 capsule 1    Sig: Take 1 capsule (25 mg total) by mouth at bedtime.   loperamide (IMODIUM A-D) 2 MG capsule 20 capsule 0    Sig: Take 1 capsule (2 mg total) by mouth every 8 (eight) hours as needed for diarrhea or loose stools.   metoprolol succinate (TOPROL-XL) 25 MG 24 hr tablet 90 tablet 3    Sig: Take 0.5 tablets (12.5 mg total) by mouth daily.   Zoster Vaccine Adjuvanted [facility]) injection 0.5 mL 0    Sig: Inject 0.5 mLs into the muscle once for 1 dose.    Return in about 3 months (around 08/16/2022) for Appt with Lurena Joiner in 4 wks for Medicare Wellness visit.  Karle Plumber, MD, FACP

## 2022-05-16 NOTE — Telephone Encounter (Signed)
noted 

## 2022-05-17 ENCOUNTER — Telehealth: Payer: Self-pay

## 2022-05-17 LAB — BASIC METABOLIC PANEL
BUN/Creatinine Ratio: 21 (ref 10–24)
BUN: 14 mg/dL (ref 8–27)
CO2: 20 mmol/L (ref 20–29)
Calcium: 9.1 mg/dL (ref 8.6–10.2)
Chloride: 106 mmol/L (ref 96–106)
Creatinine, Ser: 0.67 mg/dL — ABNORMAL LOW (ref 0.76–1.27)
Glucose: 101 mg/dL — ABNORMAL HIGH (ref 70–99)
Potassium: 4 mmol/L (ref 3.5–5.2)
Sodium: 138 mmol/L (ref 134–144)
eGFR: 106 mL/min/{1.73_m2} (ref >59)

## 2022-05-17 NOTE — Telephone Encounter (Signed)
I received a message from [facility] regarding eligibility for a power wheelchair.   She said that normally if a patient is receiving prosthetics, he would not be eligible for a power wheelchair  She said to qualify for the power wheelchair, it would need to be documented  a few months after receiving the prosthetics that he is unable to use the prosthetics.  He cannot get prosthetics and a power wheelchair at the same time.

## 2022-05-17 NOTE — Telephone Encounter (Signed)
Order for wheelchair and provider's notes faxed to [facility]

## 2022-05-20 ENCOUNTER — Ambulatory Visit: Payer: Medicare Other | Attending: Internal Medicine | Admitting: *Deleted

## 2022-05-20 DIAGNOSIS — Z0000 Encounter for general adult medical examination without abnormal findings: Secondary | ICD-10-CM

## 2022-05-20 NOTE — Patient Instructions (Signed)
Health Maintenance, Male Adopting a healthy lifestyle and getting preventive care are important in promoting health and wellness. Ask your health care provider about: The right schedule for you to have regular tests and exams. Things you can do on your own to prevent diseases and keep yourself healthy. What should I know about diet, weight, and exercise? Eat a healthy diet  Eat a diet that includes plenty of vegetables, fruits, low-fat dairy products, and lean protein. Do not eat a lot of foods that are high in solid fats, added sugars, or sodium. Maintain a healthy weight Body mass index (BMI) is a measurement that can be used to identify possible weight problems. It estimates body fat based on height and weight. Your health care provider can help determine your BMI and help you achieve or maintain a healthy weight. Get regular exercise Get regular exercise. This is one of the most important things you can do for your health. Most adults should: Exercise for at least 150 minutes each week. The exercise should increase your heart rate and make you sweat (moderate-intensity exercise). Do strengthening exercises at least twice a week. This is in addition to the moderate-intensity exercise. Spend less time sitting. Even light physical activity can be beneficial. Watch cholesterol and blood lipids Have your blood tested for lipids and cholesterol at 62 years of age, then have this test every 5 years. You may need to have your cholesterol levels checked more often if: Your lipid or cholesterol levels are high. You are older than 62 years of age. You are at high risk for heart disease. What should I know about cancer screening? Many types of cancers can be detected early and may often be prevented. Depending on your health history and family history, you may need to have cancer screening at various ages. This may include screening for: Colorectal cancer. Prostate cancer. Skin cancer. Lung  cancer. What should I know about heart disease, diabetes, and high blood pressure? Blood pressure and heart disease High blood pressure causes heart disease and increases the risk of stroke. This is more likely to develop in people who have high blood pressure readings or are overweight. Talk with your health care provider about your target blood pressure readings. Have your blood pressure checked: Every 3-5 years if you are 18-39 years of age. Every year if you are 40 years old or older. If you are between the ages of 65 and 75 and are a current or former smoker, ask your health care provider if you should have a one-time screening for abdominal aortic aneurysm (AAA). Diabetes Have regular diabetes screenings. This checks your fasting blood sugar level. Have the screening done: Once every three years after age 45 if you are at a normal weight and have a low risk for diabetes. More often and at a younger age if you are overweight or have a high risk for diabetes. What should I know about preventing infection? Hepatitis B If you have a higher risk for hepatitis B, you should be screened for this virus. Talk with your health care provider to find out if you are at risk for hepatitis B infection. Hepatitis C Blood testing is recommended for: Everyone born from 1945 through 1965. Anyone with known risk factors for hepatitis C. Sexually transmitted infections (STIs) You should be screened each year for STIs, including gonorrhea and chlamydia, if: You are sexually active and are younger than 62 years of age. You are older than 62 years of age and your   health care provider tells you that you are at risk for this type of infection. Your sexual activity has changed since you were last screened, and you are at increased risk for chlamydia or gonorrhea. Ask your health care provider if you are at risk. Ask your health care provider about whether you are at high risk for HIV. Your health care provider  may recommend a prescription medicine to help prevent HIV infection. If you choose to take medicine to prevent HIV, you should first get tested for HIV. You should then be tested every 3 months for as long as you are taking the medicine. Follow these instructions at home: Alcohol use Do not drink alcohol if your health care provider tells you not to drink. If you drink alcohol: Limit how much you have to 0-2 drinks a day. Know how much alcohol is in your drink. In the [place] one drink equals one 12 oz bottle of beer (355 mL), one 5 oz glass of wine (148 mL), or one 1 oz glass of hard liquor (44 mL). Lifestyle Do not use any products that contain nicotine or tobacco. These products include cigarettes, chewing tobacco, and vaping devices, such as e-cigarettes. If you need help quitting, ask your health care provider. Do not use street drugs. Do not share needles. Ask your health care provider for help if you need support or information about quitting drugs. General instructions Schedule regular health, dental, and eye exams. Stay current with your vaccines. Tell your health care provider if: You often feel depressed. You have ever been abused or do not feel safe at home. Summary Adopting a healthy lifestyle and getting preventive care are important in promoting health and wellness. Follow your health care provider's instructions about healthy diet, exercising, and getting tested or screened for diseases. Follow your health care provider's instructions on monitoring your cholesterol and blood pressure. This information is not intended to replace advice given to you by your health care provider. Make sure you discuss any questions you have with your health care provider. Document Revised: 12/07/2020 Document Reviewed: 12/07/2020 Elsevier Patient Education  2023 Elsevier Inc.  

## 2022-05-20 NOTE — Progress Notes (Deleted)
  I connected with  [name] on 05/20/22 by a {Video Enabled:26378::"video and audio"} enabled telemedicine application and verified that I am speaking with the correct person using two identifiers.  {Patient [phone]::"Home"}  {Provider [phone]::"Home Office"}  I discussed the limitations of evaluation and management by telemedicine. The patient expressed understanding and agreed to proceed.   Nutrition Risk Assessment:  Has the patient had any N/V/D within the last 2 months?  {YES/NO:21197} Does the patient have any non-healing wounds?  {YES/NO:21197} Has the patient had any unintentional weight loss or weight gain?  {YES/NO:21197}  Nutrition Risk Assessment:  Has the patient had any N/V/D within the last 2 months?  {YES/NO:21197} Does the patient have any non-healing wounds?  {YES/NO:21197} Has the patient had any unintentional weight loss or weight gain?  {YES/NO:21197}  Diabetes:  Is the patient diabetic?  {YES/NO:21197} If diabetic, was a CBG obtained today?  {YES/NO:21197} Did the patient bring in their glucometer from home?  {YES/NO:21197} How often do you monitor your CBG's? ***.   Financial Strains and Diabetes Management:  Are you having any financial strains with the device, your supplies or your medication? {YES/NO:21197}.  Does the patient want to be seen by Chronic Care Management for management of their diabetes?  {YES/NO:21197} Would the patient like to be referred to a Nutritionist or for Diabetic Management?  {YES/NO:21197}  Diabetic Exams:  {Diabetic Eye Exam:2101801} {Diabetic Foot Exam:2101802}  Diabetes:  Is the patient diabetic?  {YES/NO:21197} If diabetic, was a CBG obtained today?  {YES/NO:21197} Did the patient bring in their glucometer from home?  {YES/NO:21197} How often do you monitor your CBG's? ***.   Financial Strains and Diabetes Management:  Are you having any financial strains with the device, your  supplies or your medication? {YES/NO:21197}.  Does the patient want to be seen by Chronic Care Management for management of their diabetes?  {YES/NO:21197} Would the patient like to be referred to a Nutritionist or for Diabetic Management?  {YES/NO:21197}  Diabetic Exams:  {Diabetic Eye Exam:2101801} {Diabetic Foot Exam:2101802}

## 2022-06-06 ENCOUNTER — Telehealth: Payer: Self-pay | Admitting: Internal Medicine

## 2022-06-06 NOTE — Telephone Encounter (Signed)
-----   Message from Ena Dawley sent at 06/06/2022 10:42 AM EST ----- Regarding: FW: Behavioral health referral  Good Morning  [facility]   contacted patient  again   no  results I called patient  [phone] Not  working #  &  [phone]  someone else  answer the phone  Chrys Racer.  ----- Message ----- From: Ladell Pier, MD Sent: 05/16/2022   6:07 PM EST To: Ena Dawley Subject: Behavioral health referral                     Can you assist him in getting in with [facility].  I had submitted a referral when I saw him 6 weeks ago.  They tried to contact him but his voicemail box was not set up.  He now has his phone and I have updated his telephone information.

## 2022-06-09 NOTE — Telephone Encounter (Signed)
I spoke to Justina/ [employer] who explain that the patient's wheelchair was serviced on 05/17/2022 and he received leg rest extensions and a cushion on 05/26/2022.

## 2022-06-17 ENCOUNTER — Telehealth: Payer: Self-pay | Admitting: Internal Medicine

## 2022-06-17 NOTE — Telephone Encounter (Signed)
Pt is needing assistance.

## 2022-06-17 NOTE — Telephone Encounter (Signed)
Patient states he was advised by his social worker to follow up with PCP to assist with shelter and food. Patient would like a follow up call as soon as possible.

## 2022-06-21 ENCOUNTER — Encounter: Payer: Self-pay | Admitting: Internal Medicine

## 2022-06-21 NOTE — Telephone Encounter (Signed)
[facility] contacted patient to assist with resources. Patient did not answer

## 2022-06-26 ENCOUNTER — Emergency Department (HOSPITAL_COMMUNITY): Payer: Medicare Other

## 2022-06-26 ENCOUNTER — Encounter (HOSPITAL_COMMUNITY): Payer: Self-pay | Admitting: Emergency Medicine

## 2022-06-26 ENCOUNTER — Emergency Department (HOSPITAL_COMMUNITY)
Admission: EM | Admit: 2022-06-26 | Discharge: 2022-06-27 | Disposition: A | Discharge status: Home / Self Care | Payer: Medicare Other | Attending: Emergency Medicine | Admitting: Emergency Medicine

## 2022-06-26 ENCOUNTER — Other Ambulatory Visit: Payer: Self-pay

## 2022-06-26 DIAGNOSIS — Z79899 Other long term (current) drug therapy: Secondary | ICD-10-CM | POA: Diagnosis not present

## 2022-06-26 DIAGNOSIS — I10 Essential (primary) hypertension: Secondary | ICD-10-CM | POA: Insufficient documentation

## 2022-06-26 DIAGNOSIS — Z1152 Encounter for screening for COVID-19: Secondary | ICD-10-CM | POA: Diagnosis not present

## 2022-06-26 DIAGNOSIS — F32A Depression, unspecified: Secondary | ICD-10-CM

## 2022-06-26 DIAGNOSIS — F329 Major depressive disorder, single episode, unspecified: Secondary | ICD-10-CM | POA: Insufficient documentation

## 2022-06-26 DIAGNOSIS — Z7984 Long term (current) use of oral hypoglycemic drugs: Secondary | ICD-10-CM | POA: Diagnosis not present

## 2022-06-26 DIAGNOSIS — E119 Type 2 diabetes mellitus without complications: Secondary | ICD-10-CM | POA: Diagnosis not present

## 2022-06-26 DIAGNOSIS — F419 Anxiety disorder, unspecified: Secondary | ICD-10-CM | POA: Diagnosis not present

## 2022-06-26 DIAGNOSIS — F4323 Adjustment disorder with mixed anxiety and depressed mood: Secondary | ICD-10-CM | POA: Diagnosis present

## 2022-06-26 DIAGNOSIS — R45851 Suicidal ideations: Secondary | ICD-10-CM

## 2022-06-26 DIAGNOSIS — R0602 Shortness of breath: Secondary | ICD-10-CM | POA: Insufficient documentation

## 2022-06-26 LAB — ETHANOL: Alcohol, Ethyl (B): <10 mg/dL (ref <10)

## 2022-06-26 LAB — BASIC METABOLIC PANEL
Anion gap: 10 (ref 5–15)
BUN: 17 mg/dL (ref 8–23)
CO2: 20 mmol/L — ABNORMAL LOW (ref 22–32)
Calcium: 9.5 mg/dL (ref 8.9–10.3)
Chloride: 109 mmol/L (ref 98–111)
Creatinine, Ser: 0.67 mg/dL (ref 0.61–1.24)
GFR, Estimated: >60 mL/min (ref >60)
Glucose, Bld: 107 mg/dL — ABNORMAL HIGH (ref 70–99)
Potassium: 3.7 mmol/L (ref 3.5–5.1)
Sodium: 139 mmol/L (ref 135–145)

## 2022-06-26 LAB — ACETAMINOPHEN LEVEL: Acetaminophen (Tylenol), Serum: <10 ug/mL — ABNORMAL LOW (ref 10–30)

## 2022-06-26 LAB — RESP PANEL BY RT-PCR (FLU A&B, COVID) ARPGX2
Influenza A by PCR: POSITIVE — AB
Influenza B by PCR: NEGATIVE
SARS Coronavirus 2 by RT PCR: NEGATIVE

## 2022-06-26 LAB — URINALYSIS, ROUTINE W REFLEX MICROSCOPIC
Bilirubin Urine: NEGATIVE
Glucose, UA: NEGATIVE mg/dL
Hgb urine dipstick: NEGATIVE
Ketones, ur: NEGATIVE mg/dL
Leukocytes,Ua: NEGATIVE
Nitrite: NEGATIVE
Protein, ur: NEGATIVE mg/dL
Specific Gravity, Urine: 1.026 (ref 1.005–1.030)
pH: 6 (ref 5.0–8.0)

## 2022-06-26 LAB — CBC
HCT: 37.9 % — ABNORMAL LOW (ref 39.0–52.0)
Hemoglobin: 13.2 g/dL (ref 13.0–17.0)
MCH: 33.5 pg (ref 26.0–34.0)
MCHC: 34.8 g/dL (ref 30.0–36.0)
MCV: 96.2 fL (ref 80.0–100.0)
Platelets: 91 10*3/uL — ABNORMAL LOW (ref 150–400)
RBC: 3.94 MIL/uL — ABNORMAL LOW (ref 4.22–5.81)
RDW: 14.6 % (ref 11.5–15.5)
WBC: 6 10*3/uL (ref 4.0–10.5)
nRBC: 0 % (ref 0.0–0.2)

## 2022-06-26 LAB — TROPONIN I (HIGH SENSITIVITY): Troponin I (High Sensitivity): 3 ng/L (ref <18)

## 2022-06-26 LAB — SALICYLATE LEVEL: Salicylate Lvl: <7 mg/dL — ABNORMAL LOW (ref 7.0–30.0)

## 2022-06-26 MED ORDER — TRAZODONE HCL 100 MG PO TABS
50.0000 mg | ORAL_TABLET | Freq: Every evening | ORAL | Status: DC | PRN
  Administered 2022-06-26: 50 mg via ORAL
  Filled 2022-06-26: qty 1

## 2022-06-26 MED ORDER — QUETIAPINE FUMARATE 100 MG PO TABS: 100.0000 mg | ORAL_TABLET | Freq: Every day | ORAL | Status: DC

## 2022-06-26 MED ORDER — QUETIAPINE FUMARATE 50 MG PO TABS
50.0000 mg | ORAL_TABLET | Freq: Every day | ORAL | Status: DC
  Administered 2022-06-26: 50 mg via ORAL
  Filled 2022-06-26: qty 1

## 2022-06-26 NOTE — Consult Note (Signed)
[facility] ED ASSESSMENT   Reason for Consult:  Psychiatry evaluation Referring Physician:  ER Physician Patient Identification: Cory Palmer MRN:  [id number] ED Chief Complaint: Suicide ideation  Diagnosis:  Principal Problem:   Suicide ideation Active Problems:   Adjustment disorder with mixed anxiety and depressed mood   ED Assessment Time Calculation: Start Time: 1442 Stop Time: 1510 Total Time in Minutes (Assessment Completion): 28   Subjective:   Cory Palmer is a 61 y.o. male patient admitted with hx significant for depression, anxiety, Bipolar Depression and Polysubstance abuse was brought in by EMS as he flagged them to bring him to the ER for suicide ideation and not sleeping..  Patient also has hx of Bilateral amputation, Cirrhosis of the Liver, DM, HTN and AFIB.  Patient frequents the ER with mental health issues including suicide ideations.  HPI:  Patient was seen in the Triage room sitting.  He engaged in meaningful conversation with provider regarding his visit.  He is homeless and have been homeless for 15 years he says.  He moves from place to place and this last time was at [facility] and [employer].  Patient reports that he became suicidal because he could not sleep for five months after the DR at [facility] house took him off Seroquel and did not give him a reason for that.  Patient also reports that he has no outpatient Psychiatrist and that his PCP used to prescribed Seroquel and Trazodone. ER Physician documented that patient was taken off Seroquel due to Heart Condition.  Another record states that efforts to get patient to see Psychiatrist outpatient failed because nobody could reach him or contact him.  He did not answer his phone call. Patient reported that somebody found him as he was about to shoot himself with a gun but stopped him.  Patient, who has bilateral BKA  have difficulty arranging transportation for appointment.  He denied feeling suicidal when speaking with this  provider but informed ER Physician that if let go he will shoot himself with a gun. We will start low dose Seroquel and Trazodone for mood and sleep, will repeat EKG in am.  We will seek inpatient hospitalization to stabilize patient and then get him referral to [facility] health Facility.  He denies HI/AVH and no mention of paranoia.  Past Psychiatric History: epression, anxiety, Bipolar Depression and Polysubstance abuse.  Patient was receiving Mental healthcare at [facility] but have not been there for years he says.  No outpatient Mental healthcare at this time.  Risk to Self or Others: Is the patient at risk to self? Yes Has the patient been a risk to self in the past 6 months? Yes Has the patient been a risk to self within the distant past? Yes Is the patient a risk to others? No Has the patient been a risk to others in the past 6 months? No Has the patient been a risk to others within the distant past? No  [place] Scale:  Flowsheet Row ED from 06/26/2022 in [facility] from 04/09/2022 in [facility] EMERGENCY DEPARTMENT ED to Hosp-Admission (Discharged) from 03/23/2022 in [facility]  SURGICAL  C-SSRS RISK CATEGORY Error: Question 1 not populated High Risk No Risk       AIMS:  , , ,  ,   ASAM:    Substance Abuse:     Past Medical History:  Past Medical History:  Diagnosis Date   Anxiety  Depressed bipolar disorder (HCC)    Diabetes mellitus without complication (HCC)    Hypertension    Liver cirrhosis (HCC)    Osteomyelitis of left foot (HCC) 10/19/2019   Osteomyelitis of toe of left foot (HCC)    PAF (paroxysmal atrial fibrillation) (HCC)    S/P transmetatarsal amputation of foot, 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    Past Surgical History:  Procedure Laterality Date   AMPUTATION Left 09/28/2018   Procedure: LEFT TRANSMETATARSAL AMPUTATION;  Surgeon: Nadara Mustard, MD;  Location: [facility] OR;   Service: Orthopedics;  Laterality: Left;   AMPUTATION Left 10/23/2019   Procedure: AMPUTATION BELOW KNEE;  Surgeon: Nadara Mustard, MD;  Location: [facility] OR;  Service: Orthopedics;  Laterality: Left;   BELOW KNEE LEG AMPUTATION Right    ESOPHAGOGASTRODUODENOSCOPY (EGD) WITH PROPOFOL N/A 10/19/2020   Procedure: ESOPHAGOGASTRODUODENOSCOPY (EGD) WITH PROPOFOL;  Surgeon: Charna Elizabeth, MD;  Location: [facility] ENDOSCOPY;  Service: Endoscopy;  Laterality: N/A;   TEE WITHOUT CARDIOVERSION N/A 10/25/2019   Procedure: TRANSESOPHAGEAL ECHOCARDIOGRAM (TEE);  Surgeon: Lewayne Bunting, MD;  Location: [facility] ENDOSCOPY;  Service: Cardiovascular;  Laterality: N/A;   Family History:  Family History  Problem Relation Age of Onset   Hypertension Other    Diabetes Mellitus II Other    Family Psychiatric  History: Denies Social History:  Social History   Substance and Sexual Activity  Alcohol Use Not Currently     Social History   Substance and Sexual Activity  Drug Use Not Currently   Types: Cocaine   Comment: Last used in July 2023    Social History   Socioeconomic History   Marital status: Single    Spouse name: Not on file   Number of children: Not on file   Years of education: Not on file   Highest education level: Not on file  Occupational History   Not on file  Tobacco Use   Smoking status: Every Day    Packs/day: 0.25    Types: Cigarettes   Smokeless tobacco: Never  Vaping Use   Vaping Use: Never used  Substance and Sexual Activity   Alcohol use: Not Currently   Drug use: Not Currently    Types: Cocaine    Comment: Last used in July 2023   Sexual activity: Not Currently  Other Topics Concern   Not on file  Social History Narrative   Not on file   Social Determinants of Health   Financial Resource Strain: Unknown (05/07/2018)   Overall Financial Resource Strain (CARDIA)    Difficulty of Paying Living Expenses: Patient refused  Food Insecurity: Unknown (05/07/2018)   Hunger Vital Sign     Worried About Running Out of Food in the Last Year: Patient refused    Ran Out of Food in the Last Year: Patient refused  Transportation Needs: Unknown (05/07/2018)   PRAPARE - Administrator, Civil Service (Medical): Patient refused    Lack of Transportation (Non-Medical): Patient refused  Physical Activity: Unknown (05/07/2018)   Exercise Vital Sign    Days of Exercise per Week: Patient refused    Minutes of Exercise per Session: Patient refused  Stress: Not on file  Social Connections: Not on file   Additional Social History:    Allergies:  No Known Allergies  Labs:  Results for orders placed or performed during the hospital encounter of 06/26/22 (from the past 48 hour(s))  CBC     Status: Abnormal   Collection Time: 06/26/22 10:45 AM  Result Value Ref  Range   WBC 6.0 4.0 - 10.5 K/uL   RBC 3.94 (L) 4.22 - 5.81 MIL/uL   Hemoglobin 13.2 13.0 - 17.0 g/dL   HCT 23.5 (L) 57.3 - 22.0 %   MCV 96.2 80.0 - 100.0 fL   MCH 33.5 26.0 - 34.0 pg   MCHC 34.8 30.0 - 36.0 g/dL   RDW 25.4 27.0 - 62.3 %   Platelets 91 (L) 150 - 400 K/uL    Comment: SPECIMEN CHECKED FOR CLOTS Immature Platelet Fraction may be clinically indicated, consider ordering this additional test [id number] REPEATED TO VERIFY PLATELET COUNT CONFIRMED BY SMEAR    nRBC 0.0 0.0 - 0.2 %    Comment: Performed at [facility], [address], [place], [place] [address]  Basic metabolic panel     Status: Abnormal   Collection Time: 06/26/22 10:45 AM  Result Value Ref Range   Sodium 139 135 - 145 mmol/L   Potassium 3.7 3.5 - 5.1 mmol/L   Chloride 109 98 - 111 mmol/L   CO2 20 (L) 22 - 32 mmol/L   Glucose, Bld 107 (H) 70 - 99 mg/dL    Comment: Glucose reference range applies only to samples taken after fasting for at least 8 hours.   BUN 17 8 - 23 mg/dL   Creatinine, Ser 7.37 0.61 - 1.24 mg/dL   Calcium 9.5 8.9 - 10.6 mg/dL   GFR, Estimated >26 >94 mL/min    Comment: (NOTE) Calculated  using the CKD-EPI Creatinine Equation (2021)    Anion gap 10 5 - 15    Comment: Performed at [facility], [address], [place], [place] [address]  Ethanol     Status: None   Collection Time: 06/26/22 10:45 AM  Result Value Ref Range   Alcohol, Ethyl (B) <10 <10 mg/dL    Comment: (NOTE) Lowest detectable limit for serum alcohol is 10 mg/dL.  For medical purposes only. Performed at [facility], [address] [place], [place] [address]   Acetaminophen level     Status: Abnormal   Collection Time: 06/26/22 10:45 AM  Result Value Ref Range   Acetaminophen (Tylenol), Serum <10 (L) 10 - 30 ug/mL    Comment: (NOTE) Therapeutic concentrations vary significantly. A range of 10-30 ug/mL  may be an effective concentration for many patients. However, some  are best treated at concentrations outside of this range. Acetaminophen concentrations >150 ug/mL at 4 hours after ingestion  and >50 ug/mL at 12 hours after ingestion are often associated with  toxic reactions.  Performed at [facility], [address] [place], [place] [address]   Salicylate level     Status: Abnormal   Collection Time: 06/26/22 10:45 AM  Result Value Ref Range   Salicylate Lvl <7.0 (L) 7.0 - 30.0 mg/dL    Comment: Performed at [facility], [address], [place], [place] [address]  Troponin I (High Sensitivity)     Status: None   Collection Time: 06/26/22 10:45 AM  Result Value Ref Range   Troponin I (High Sensitivity) 3 <18 ng/L    Comment: (NOTE) Elevated high sensitivity troponin I (hsTnI) values and significant  changes across serial measurements may suggest ACS but many other  chronic and acute conditions are known to elevate hsTnI results.  Refer to the "Links" section for chest pain algorithms and additional  guidance. Performed at [facility], [address], [place], [place] [address]   Resp  Panel  by RT-PCR (Flu A&B, Covid) Anterior Nasal Swab     Status: Abnormal   Collection Time: 06/26/22 11:09 AM   Specimen: Anterior Nasal Swab  Result Value Ref Range   SARS Coronavirus 2 by RT PCR NEGATIVE NEGATIVE    Comment: (NOTE) SARS-CoV-2 target nucleic acids are NOT DETECTED.  The SARS-CoV-2 RNA is generally detectable in upper respiratory specimens during the acute phase of infection. The lowest concentration of SARS-CoV-2 viral copies this assay can detect is 138 copies/mL. A negative result does not preclude SARS-Cov-2 infection and should not be used as the sole basis for treatment or other patient management decisions. A negative result may occur with  improper specimen collection/handling, submission of specimen other than nasopharyngeal swab, presence of viral mutation(s) within the areas targeted by this assay, and inadequate number of viral copies(<138 copies/mL). A negative result must be combined with clinical observations, patient history, and epidemiological information. The expected result is Negative.  Fact Sheet for Patients:  BloggerCourse.comhttps://www.fda.gov/media/152166/download  Fact Sheet for Healthcare Providers:  SeriousBroker.ithttps://www.fda.gov/media/152162/download  This test is no t yet approved or cleared by the [place] FDA and  has been authorized for detection and/or diagnosis of SARS-CoV-2 by FDA under an Emergency Use Authorization (EUA). This EUA will remain  in effect (meaning this test can be used) for the duration of the COVID-19 declaration under Section 564(b)(1) of the Act, 21 U.S.C.section 360bbb-3(b)(1), unless the authorization is terminated  or revoked sooner.       Influenza A by PCR POSITIVE (A) NEGATIVE   Influenza B by PCR NEGATIVE NEGATIVE    Comment: (NOTE) The Xpert Xpress SARS-CoV-2/FLU/RSV plus assay is intended as an aid in the diagnosis of influenza from Nasopharyngeal swab specimens and should not be used as a sole basis for treatment.  Nasal washings and aspirates are unacceptable for Xpert Xpress SARS-CoV-2/FLU/RSV testing.  Fact Sheet for Patients: BloggerCourse.comhttps://www.fda.gov/media/152166/download  Fact Sheet for Healthcare Providers: SeriousBroker.ithttps://www.fda.gov/media/152162/download  This test is not yet approved or cleared by the [place] FDA and has been authorized for detection and/or diagnosis of SARS-CoV-2 by FDA under an Emergency Use Authorization (EUA). This EUA will remain in effect (meaning this test can be used) for the duration of the COVID-19 declaration under Section 564(b)(1) of the Act, 21 U.S.C. section 360bbb-3(b)(1), unless the authorization is terminated or revoked.  Performed at [facility], [address] [place], [place] [address]   Urinalysis, Routine w reflex microscopic     Status: Abnormal   Collection Time: 06/26/22 12:45 PM  Result Value Ref Range   Color, Urine AMBER (A) YELLOW    Comment: BIOCHEMICALS MAY BE AFFECTED BY COLOR   APPearance CLEAR CLEAR   Specific Gravity, Urine 1.026 1.005 - 1.030   pH 6.0 5.0 - 8.0   Glucose, UA NEGATIVE NEGATIVE mg/dL   Hgb urine dipstick NEGATIVE NEGATIVE   Bilirubin Urine NEGATIVE NEGATIVE   Ketones, ur NEGATIVE NEGATIVE mg/dL   Protein, ur NEGATIVE NEGATIVE mg/dL   Nitrite NEGATIVE NEGATIVE   Leukocytes,Ua NEGATIVE NEGATIVE    Comment: Performed at [facility], [address] [place], [place] [address]    Current Facility-Administered Medications  Medication Dose Route Frequency Provider Last Rate Last Admin   QUEtiapine (SEROQUEL) tablet 50 mg  50 mg Oral QHS Promise Weldin C, NP       traZODone (DESYREL) tablet 50 mg  50 mg Oral QHS PRN Earney Navynuoha, Johney Perotti C, NP       Current  Outpatient Medications  Medication Sig Dispense Refill   QUEtiapine (SEROQUEL) 200 MG tablet Take 300 mg by mouth 2 (two) times daily.     traZODone (DESYREL) 150 MG tablet Take 150 mg by mouth at bedtime.     amLODipine  (NORVASC) 5 MG tablet Take 1 tablet (5 mg total) by mouth daily. (Patient not taking: Reported on 06/26/2022) 30 tablet 11   hydrOXYzine (VISTARIL) 25 MG capsule Take 1 capsule (25 mg total) by mouth at bedtime. (Patient not taking: Reported on 06/26/2022) 30 capsule 1   loperamide (IMODIUM A-D) 2 MG capsule Take 1 capsule (2 mg total) by mouth every 8 (eight) hours as needed for diarrhea or loose stools. (Patient not taking: Reported on 06/26/2022) 20 capsule 0   metFORMIN (GLUCOPHAGE) 1000 MG tablet Take 1 tablet (1,000 mg total) by mouth 2 (two) times daily with a meal. (Patient not taking: Reported on 06/26/2022) 60 tablet 0   metoprolol succinate (TOPROL-XL) 25 MG 24 hr tablet Take 0.5 tablets (12.5 mg total) by mouth daily. (Patient not taking: Reported on 06/26/2022) 90 tablet 3   pravastatin (PRAVACHOL) 40 MG tablet Take 1 tablet (40 mg total) by mouth daily. (Patient not taking: Reported on 06/26/2022) 30 tablet 3   sertraline (ZOLOFT) 50 MG tablet Take 1 tablet (50 mg total) by mouth daily. (Patient not taking: Reported on 06/26/2022) 90 tablet 2    Musculoskeletal: Strength & Muscle Tone:  Bilateral BKA Gait & Station:  Bilateral BKA Patient leans:  See above   Psychiatric Specialty Exam: Presentation  General Appearance:  Casual  Eye Contact: Fleeting  Speech: Clear and Coherent; Normal Rate  Speech Volume: Normal  Handedness: Right   Mood and Affect  Mood: Depressed; Anxious  Affect: Congruent; Depressed   Thought Process  Thought Processes: Coherent; Goal Directed  Descriptions of Associations:Intact  Orientation:Full (Time, Place and Person)  Thought Content:Logical  History of Schizophrenia/Schizoaffective disorder:No  Duration of Psychotic Symptoms:Greater than six months  Hallucinations:Hallucinations: None  Ideas of Reference:None  Suicidal Thoughts:Suicidal Thoughts: Yes, Active SI Active Intent and/or Plan: With Intent; With Plan;  Without Means to Carry Out; Without Access to Means  Homicidal Thoughts:Homicidal Thoughts: No   Sensorium  Memory: Immediate Good; Recent Good; Remote Good  Judgment: Intact  Insight: Present   Executive Functions  Concentration: Good  Attention Span: Good  Recall: Good  Fund of Knowledge: Good  Language: Good   Psychomotor Activity  Psychomotor Activity: Psychomotor Activity: Normal   Assets  Assets: Communication Skills; Social Support    Sleep  Sleep: Sleep: Poor   Physical Exam: Physical Exam Vitals and nursing note reviewed.  Constitutional:      Appearance: Normal appearance.  HENT:     Head: Normocephalic and atraumatic.     Nose: Nose normal.  Cardiovascular:     Rate and Rhythm: Normal rate.  Pulmonary:     Effort: Pulmonary effort is normal.  Musculoskeletal:     Cervical back: Normal range of motion.     Comments: Bilateral BKA.  Skin:    General: Skin is warm and dry.  Neurological:     Mental Status: He is alert and oriented to person, place, and time.    Review of Systems  Constitutional: Negative.   HENT: Negative.    Eyes: Negative.   Respiratory: Negative.    Cardiovascular: Negative.   Gastrointestinal: Negative.   Genitourinary: Negative.   Musculoskeletal:        Bilateral BKA  Skin: Negative.  Neurological: Negative.   Endo/Heme/Allergies: Negative.   Psychiatric/Behavioral:  Positive for depression and suicidal ideas. The patient is nervous/anxious and has insomnia.    Blood pressure 111/70, pulse 77, temperature 98.3 F (36.8 C), resp. rate 18, SpO2 96 %. There is no height or weight on file to calculate BMI.  Medical Decision Making: Patient meets criteria for inpatient hospitalization for stabilization and safety.  Sleep is a main concern for patient since Seroquel was stopped.  We will monitor EKG while gradually reintroducing Seroquel and Trazodone.  Problem 1: Suicide ideation  Problem  2: Adjustment disorder with Mixed anxiety and depression  Disposition:  Admit, seek bed placement.  Earney Navy, NP-PMHNP-BC 06/26/2022 3:13 PM

## 2022-06-26 NOTE — ED Provider Notes (Signed)
[facility] DEPT Provider Note   CSN: [id number] Arrival date & time: 06/26/22  [id number]     History  Chief Complaint  Patient presents with   Suicidal    Cory Palmer is a 61 y.o. male with medical history of bilateral BKA, anxiety, depressed bipolar disorder, diabetes, hypertension, cirrhosis, paroxysmal A-fib.  The patient presents to the ED for evaluation of suicidality.  Patient reports that he was recently staying at the [facility] when a doctor who he is unable to tell me the name of came and took him off of his Seroquel.  The patient reports that this occurred 5 months ago.  Patient states that the doctor took him off of his medication because of concern over his heart.  Patient states since this occurred he has been unable to sleep, has been very anxious and depressed.  Patient reported that yesterday he had plan to shoot himself in the head with a firearm which he has access to however a bystander saw this about to occur and stopped him from doing so.  The patient reports that he is suicidal and if we discharge him he will use his gun to kill himself.  The patient denies any homicidality, AVH.  Patient is also endorsing chest pain that began 3 months ago and is associated with shortness of breath.  Patient denies any nausea, vomiting, fevers, diarrhea, abdominal pain.  HPI     Home Medications Prior to Admission medications   Medication Sig Start Date End Date Taking? Authorizing Provider  QUEtiapine (SEROQUEL) 200 MG tablet Take 300 mg by mouth 2 (two) times daily.   Yes [provider]  traZODone (DESYREL) 150 MG tablet Take 150 mg by mouth at bedtime. 05/24/22  Yes [provider]  amLODipine (NORVASC) 5 MG tablet Take 1 tablet (5 mg total) by mouth daily. Patient not taking: Reported on 06/26/2022 05/04/22   Elsie Stain, MD  hydrOXYzine (VISTARIL) 25 MG capsule Take 1 capsule (25 mg total) by mouth at bedtime. Patient not  taking: Reported on 06/26/2022 05/16/22   Ladell Pier, MD  loperamide (IMODIUM A-D) 2 MG capsule Take 1 capsule (2 mg total) by mouth every 8 (eight) hours as needed for diarrhea or loose stools. Patient not taking: Reported on 06/26/2022 05/16/22   Ladell Pier, MD  metFORMIN (GLUCOPHAGE) 1000 MG tablet Take 1 tablet (1,000 mg total) by mouth 2 (two) times daily with a meal. Patient not taking: Reported on 06/26/2022 03/29/22 06/04/22  Little Ishikawa, MD  metoprolol succinate (TOPROL-XL) 25 MG 24 hr tablet Take 0.5 tablets (12.5 mg total) by mouth daily. Patient not taking: Reported on 06/26/2022 05/16/22   Ladell Pier, MD  pravastatin (PRAVACHOL) 40 MG tablet Take 1 tablet (40 mg total) by mouth daily. Patient not taking: Reported on 06/26/2022 05/04/22   Elsie Stain, MD  sertraline (ZOLOFT) 50 MG tablet Take 1 tablet (50 mg total) by mouth daily. Patient not taking: Reported on 06/26/2022 05/04/22   Elsie Stain, MD      Allergies    Patient has no known allergies.    Review of Systems   Review of Systems  Constitutional:  Negative for fever.  Respiratory:  Positive for shortness of breath.   Cardiovascular:  Positive for chest pain.  Gastrointestinal:  Negative for abdominal pain, diarrhea, nausea and vomiting.  Psychiatric/Behavioral:  Positive for sleep disturbance and suicidal ideas. Negative for hallucinations.   All other systems reviewed  and are negative.   Physical Exam Updated Vital Signs BP 111/70   Pulse 77   Temp 98.3 F (36.8 C)   Resp 18   SpO2 96%  Physical Exam Vitals and nursing note reviewed.  Constitutional:      General: He is not in acute distress.    Appearance: Normal appearance. He is not ill-appearing, toxic-appearing or diaphoretic.  HENT:     Head: Normocephalic and atraumatic.     Nose: Nose normal. No congestion.     Mouth/Throat:     Mouth: Mucous membranes are moist.     Pharynx: Oropharynx is clear.   Eyes:     Extraocular Movements: Extraocular movements intact.     Conjunctiva/sclera: Conjunctivae normal.     Pupils: Pupils are equal, round, and reactive to light.  Cardiovascular:     Rate and Rhythm: Normal rate and regular rhythm.  Pulmonary:     Effort: Pulmonary effort is normal.     Breath sounds: Normal breath sounds. No wheezing.  Abdominal:     General: Abdomen is flat. Bowel sounds are normal.     Palpations: Abdomen is soft.     Tenderness: There is no abdominal tenderness.  Musculoskeletal:     Cervical back: Normal range of motion and neck supple. No tenderness.     Right Lower Extremity: Right leg is amputated below knee.     Left Lower Extremity: Left leg is amputated below knee.  Skin:    General: Skin is warm and dry.     Capillary Refill: Capillary refill takes less than 2 seconds.  Neurological:     Mental Status: He is alert and oriented to person, place, and time. Mental status is at baseline.     ED Results / Procedures / Treatments   Labs (all labs ordered are listed, but only abnormal results are displayed) Labs Reviewed  RESP PANEL BY RT-PCR (FLU A&B, COVID) ARPGX2 - Abnormal; Notable for the following components:      Result Value   Influenza A by PCR POSITIVE (*)    All other components within normal limits  CBC - Abnormal; Notable for the following components:   RBC 3.94 (*)    HCT 37.9 (*)    Platelets 91 (*)    All other components within normal limits  BASIC METABOLIC PANEL - Abnormal; Notable for the following components:   CO2 20 (*)    Glucose, Bld 107 (*)    All other components within normal limits  URINALYSIS, ROUTINE W REFLEX MICROSCOPIC - Abnormal; Notable for the following components:   Color, Urine AMBER (*)    All other components within normal limits  ACETAMINOPHEN LEVEL - Abnormal; Notable for the following components:   Acetaminophen (Tylenol), Serum <10 (*)    All other components within normal limits  SALICYLATE  LEVEL - Abnormal; Notable for the following components:   Salicylate Lvl [id number] (*)    All other components within normal limits  ETHANOL  TROPONIN I (HIGH SENSITIVITY)    EKG None  Radiology DG Chest Portable 1 View  Result Date: 06/26/2022 CLINICAL DATA:  Chest pain EXAM: PORTABLE CHEST 1 VIEW COMPARISON:  Chest radiograph 03/24/2022 FINDINGS: The cardiomediastinal silhouette is normal. There is no focal consolidation or pulmonary edema. There is no pleural effusion or pneumothorax There is no acute osseous abnormality. IMPRESSION: No radiographic evidence of acute cardiopulmonary process. Electronically Signed   By: Valetta Mole M.D.   On: 06/26/2022 11:02  Procedures Procedures   Medications Ordered in ED Medications  QUEtiapine (SEROQUEL) tablet 100 mg (has no administration in time range)  traZODone (DESYREL) tablet 50 mg (has no administration in time range)    ED Course/ Medical Decision Making/ A&P                           Medical Decision Making Amount and/or Complexity of Data Reviewed Labs: ordered. Radiology: ordered.   61 year old male presents to ED for evaluation.  Please see HPI for further details.  On my examination the patient is afebrile, nontachycardic.  The patient and sounds are clear bilaterally, he is not hypoxic.  The patient abdomen is soft and compressible throughout.  Patient neurological examination shows no focal neurodeficits.  The patient does have bilateral BKA's.  Patient nontoxic in appearance.  Patient informs me that he attempted to use a gun he has access to to commit suicide yesterday.  Patient reports that a bystander stopped this.  Patient states that if he were to go home or be discharged today, he still has access to firearm and would "shoot himself in the head".  Patient will be worked up utilizing following labs and imaging studies to include CBC, BMP, salicylate level, acetaminophen level, ethanol, troponin x2, respiratory  panel, urinalysis, chest x-ray.  CBC unremarkable, no leukocytosis or anemia.  The patient BMP also unremarkable.  Patient salicylate level, acetaminophen level, ethanol undetectable.  Patient troponin 3.  EKG nonischemic.  Patient urinalysis unremarkable.  The patient respiratory panel is positive for influenza A.  Patient chest x-ray shows no consolidations or effusions.  At this point, patient is cleared from medical standpoint.  Patient awaiting TTS consult.  Patient still under involuntary commitment.  Final Clinical Impression(s) / ED Diagnoses Final diagnoses:  Suicidal ideations  Depression, unspecified depression type    Rx / DC Orders ED Discharge Orders     None         Al Decant, PA-C 06/26/22 1422    Arby Barrette, MD 06/27/22 1829

## 2022-06-26 NOTE — ED Triage Notes (Signed)
BIB EMS from the [facility], he flagged EMS down and reports SI. He reports getting off Seroquel 5 months ago. Would like refill on Seroquel.   BP 188/100 P 100 O2 95%

## 2022-06-27 DIAGNOSIS — R45851 Suicidal ideations: Secondary | ICD-10-CM

## 2022-06-27 MED ORDER — TRAZODONE HCL 50 MG PO TABS: 50.0000 mg | ORAL_TABLET | Freq: Every evening | ORAL | 0 refills | Status: DC | PRN

## 2022-06-27 MED ORDER — QUETIAPINE FUMARATE 50 MG PO TABS: 50.0000 mg | ORAL_TABLET | Freq: Every day | ORAL | 0 refills | Status: DC

## 2022-06-27 NOTE — ED Provider Notes (Addendum)
Emergency Medicine Observation Re-evaluation Note  Cory Palmer is a 61 y.o. male, seen on rounds today.  Pt initially presented to the ED for complaints of Suicidal Currently, the patient is resting comfortably in NAD.  Physical Exam  BP 108/67 (BP Location: Right Arm)   Pulse 62   Temp 98.4 F (36.9 C) (Oral)   Resp 18   SpO2 99%  Physical Exam General: Appears to be resting comfortably in bed, no acute distress. Cardiac: Regular rate, normal heart rate, non-emergent blood pressure for this morning's vitals. Lungs: No increased work of breathing.  Equal chest rise appreciated Psych: Calm, asleep in bed.   ED Course / MDM  EKG:EKG Interpretation  Date/Time:  Sunday June 26 2022 11:16:36 EST Ventricular Rate:  81 PR Interval:    QRS Duration: 106 QT Interval:  385 QTC Calculation: 447 R Axis:   68 Text Interpretation: Atrial fibrillation Borderline repolarization abnormality When compared with ECG of 04/09/2022 REPOLARIZATION ABNORMALITY is now present Confirmed by Dione Booze ([id number]) on 06/27/2022 3:02:48 AM  I have reviewed the labs performed to date as well as medications administered while in observation.   Plan  Current plan is for inpatient psychiatric admission.    Glyn Ade, MD 06/27/22 [phone]  Update, per psychiatric team, patient is psychiatrically cleared today for outpatient psychiatric follow-up.  Resources placed in chart.  Patient discharged.  IVC reversal form submitted.    Glyn Ade, MD 06/27/22 1141

## 2022-06-27 NOTE — Discharge Summary (Signed)
[facility] Psych ED Discharge  06/27/2022 10:52 AM Cory Palmer  MRN:  [id number]  Principal Problem: Suicide ideation Discharge Diagnoses: Principal Problem:   Suicide ideation Active Problems:   Adjustment disorder with mixed anxiety and depressed mood  Clinical Impression:  Final diagnoses:  Suicidal ideations  Depression, unspecified depression type   Subjective:  Expand All Collapse All [facility] ED ASSESSMENT    Reason for Consult:  Psychiatry evaluation Referring Physician:  ER Physician Patient Identification: Cory Palmer MRN:  [id number] ED Chief Complaint: Suicide ideation  Diagnosis:  Principal Problem:   Suicide ideation Active Problems:   Adjustment disorder with mixed anxiety and depressed mood     ED Assessment Time Calculation: Start Time: 1442 Stop Time: 1510 Total Time in Minutes (Assessment Completion): 28     Subjective:   Cory Palmer is a 61 y.o. male patient admitted with hx significant for depression, anxiety, Bipolar Depression and Polysubstance abuse was brought in by EMS as he flagged them to bring him to the ER for suicide ideation and not sleeping..  Patient also has hx of Bilateral amputation, Cirrhosis of the Liver, DM, HTN and AFIB.  Patient frequents the ER with mental health issues including suicide ideations.  Patient spent the night in the ER and reports good sleep when he was given his Seroquel 50 mg.  QTC Interval yesterday was 447 but today's QTC Interval is 467.Marland Kitchen  Provider informed patient that Seroquel and Trazodone could increase QTC prolongation and to consider other options for sleep.  Patient adamantly refused to try any other medications.  Patient replied" I would rather take Seroquel for sleep and die from it than try something else that will not work"  Patient denies SI/HI/AVH.  Patient has agreed to seek Mental healthcare at [facility].  He has transportation that shuttles him around for his appointments but  states lately it is hard to get hold of them.  We will offer Trazodone 50 mg as needed for sleep for two weeks only and Seroquel 50 mg for two weeks for sleep at night.  Patient is advised to see his PCP and Cardiologist.  Referral is also is given for Mental healthcare at [facility].  Patient is Psychiatrically cleared.  ED Assessment Time Calculation: Start Time: [id number] Stop Time: [id number] Total Time in Minutes (Assessment Completion): 24   Past Psychiatric History: see initial psychiatry evaluation note  Past Medical History:  Past Medical History:  Diagnosis Date   Anxiety    Depressed bipolar disorder ([place])    Diabetes mellitus without complication ([place])    Hypertension    Liver cirrhosis ([place])    Osteomyelitis of left foot ([place]) 10/19/2019   Osteomyelitis of toe of left foot ([facility])    PAF (paroxysmal atrial fibrillation) ([place])    S/P transmetatarsal amputation of foot, left ([place]) 09/28/2018    Past Surgical History:  Procedure Laterality Date   AMPUTATION Left 09/28/2018   Procedure: LEFT TRANSMETATARSAL AMPUTATION;  Surgeon: Newt Minion, MD;  Location: [facility];  Service: Orthopedics;  Laterality: Left;   AMPUTATION Left 10/23/2019   Procedure: AMPUTATION BELOW KNEE;  Surgeon: Newt Minion, MD;  Location: [facility];  Service: Orthopedics;  Laterality: Left;   BELOW KNEE LEG AMPUTATION Right    ESOPHAGOGASTRODUODENOSCOPY (EGD) WITH PROPOFOL N/A 10/19/2020   Procedure: ESOPHAGOGASTRODUODENOSCOPY (EGD) WITH PROPOFOL;  Surgeon: Juanita Craver, MD;  Location: [facility] ENDOSCOPY;  Service: Endoscopy;  Laterality: N/A;   TEE WITHOUT  CARDIOVERSION N/A 10/25/2019   Procedure: TRANSESOPHAGEAL ECHOCARDIOGRAM (TEE);  Surgeon: Lewayne Bunting, MD;  Location[facility] ENDOSCOPY;  Service: Cardiovascular;  Laterality: N/A;   Family History:  Family History  Problem Relation Age of Onset   Hypertension Other    Diabetes Mellitus II Other    Family Psychiatric  History: see initial  evaluation note Social History:  Social History   Substance and Sexual Activity  Alcohol Use Not Currently     Social History   Substance and Sexual Activity  Drug Use Not Currently   Types: Cocaine   Comment: Last used in July 2023    Social History   Socioeconomic History   Marital status: Single    Spouse name: Not on file   Number of children: Not on file   Years of education: Not on file   Highest education level: Not on file  Occupational History   Not on file  Tobacco Use   Smoking status: Every Day    Packs/day: 0.25    Types: Cigarettes   Smokeless tobacco: Never  Vaping Use   Vaping Use: Never used  Substance and Sexual Activity   Alcohol use: Not Currently   Drug use: Not Currently    Types: Cocaine    Comment: Last used in July 2023   Sexual activity: Not Currently  Other Topics Concern   Not on file  Social History Narrative   Not on file   Social Determinants of Health   Financial Resource Strain: Unknown (05/07/2018)   Overall Financial Resource Strain (CARDIA)    Difficulty of Paying Living Expenses: Patient refused  Food Insecurity: Unknown (05/07/2018)   Hunger Vital Sign    Worried About Running Out of Food in the Last Year: Patient refused    Ran Out of Food in the Last Year: Patient refused  Transportation Needs: Unknown (05/07/2018)   PRAPARE - Administrator, Civil Service (Medical): Patient refused    Lack of Transportation (Non-Medical): Patient refused  Physical Activity: Unknown (05/07/2018)   Exercise Vital Sign    Days of Exercise per Week: Patient refused    Minutes of Exercise per Session: Patient refused  Stress: Not on file  Social Connections: Not on file    Tobacco Cessation:  N/A, patient does not currently use tobacco products  Current Medications: Current Facility-Administered Medications  Medication Dose Route Frequency Provider Last Rate Last Admin   QUEtiapine (SEROQUEL) tablet 50 mg  50 mg Oral QHS  Myrah Strawderman C, NP   50 mg at 06/26/22 2025   traZODone (DESYREL) tablet 50 mg  50 mg Oral QHS PRN Dahlia Byes C, NP   50 mg at 06/26/22 2024   Current Outpatient Medications  Medication Sig Dispense Refill   amLODipine (NORVASC) 5 MG tablet Take 1 tablet (5 mg total) by mouth daily. (Patient not taking: Reported on 06/26/2022) 30 tablet 11   hydrOXYzine (VISTARIL) 25 MG capsule Take 1 capsule (25 mg total) by mouth at bedtime. (Patient not taking: Reported on 06/26/2022) 30 capsule 1   loperamide (IMODIUM A-D) 2 MG capsule Take 1 capsule (2 mg total) by mouth every 8 (eight) hours as needed for diarrhea or loose stools. (Patient not taking: Reported on 06/26/2022) 20 capsule 0   metFORMIN (GLUCOPHAGE) 1000 MG tablet Take 1 tablet (1,000 mg total) by mouth 2 (two) times daily with a meal. (Patient not taking: Reported on 06/26/2022) 60 tablet 0   metoprolol succinate (TOPROL-XL) 25 MG 24 hr  tablet Take 0.5 tablets (12.5 mg total) by mouth daily. (Patient not taking: Reported on 06/26/2022) 90 tablet 3   pravastatin (PRAVACHOL) 40 MG tablet Take 1 tablet (40 mg total) by mouth daily. (Patient not taking: Reported on 06/26/2022) 30 tablet 3   QUEtiapine (SEROQUEL) 50 MG tablet Take 1 tablet (50 mg total) by mouth at bedtime for 15 days. 15 tablet 0   sertraline (ZOLOFT) 50 MG tablet Take 1 tablet (50 mg total) by mouth daily. (Patient not taking: Reported on 06/26/2022) 90 tablet 2   traZODone (DESYREL) 50 MG tablet Take 1 tablet (50 mg total) by mouth at bedtime as needed for up to 15 days for sleep. 15 tablet 0   PTA Medications: (Not in a hospital admission)   [place] Scale:  [facility] ED from 06/26/2022 in [facility] DEPT ED from 04/09/2022 in [facility] ED to Hosp-Admission (Discharged) from 03/23/2022 in [facility] CATEGORY Error: Question 1 not populated High  Risk No Risk       Musculoskeletal: Strength & Muscle Tone:  bilateral BKA Gait & Station:  bilateral BKA Patient leans:  See above  Psychiatric Specialty Exam: Presentation  General Appearance:  Casual  Eye Contact: Good  Speech: Clear and Coherent; Normal Rate  Speech Volume: Normal  Handedness: Right   Mood and Affect  Mood: Depressed  Affect: Congruent; Depressed   Thought Process  Thought Processes: Coherent; Goal Directed  Descriptions of Associations:Intact  Orientation:Full (Time, Place and Person)  Thought Content:Logical  History of Schizophrenia/Schizoaffective disorder:No  Duration of Psychotic Symptoms:Greater than six months  Hallucinations:Hallucinations: None  Ideas of Reference:None  Suicidal Thoughts:Suicidal Thoughts: No SI Active Intent and/or Plan: With Intent; With Plan; Without Means to Carry Out; Without Access to Means  Homicidal Thoughts:Homicidal Thoughts: No   Sensorium  Memory: Immediate Good; Recent Good; Remote Fair  Judgment: Intact  Insight: Present   Executive Functions  Concentration: Good  Attention Span: Good  Recall: Good  Fund of Knowledge: Good  Language: Good   Psychomotor Activity  Psychomotor Activity: Psychomotor Activity: Normal   Assets  Assets: Communication Skills; Social Support; Desire for Improvement   Sleep  Sleep: Sleep: Fair    Physical Exam: Physical Exam ROS Blood pressure 108/67, pulse 62, temperature 98.4 F (36.9 C), temperature source Oral, resp. rate 18, SpO2 99 %. There is no height or weight on file to calculate BMI.   Demographic Factors:  Male, Adolescent or young adult, Caucasian, Low socioeconomic status, Living alone, Unemployed, and denies accesses to fire arm today.  Loss Factors: Decline in physical health  Historical Factors: NA  Risk Reduction Factors:   Religious beliefs about death, Positive social support, and Positive  coping skills or problem solving skills  Continued Clinical Symptoms:  Depression:   Insomnia More than one psychiatric diagnosis Previous Psychiatric Diagnoses and Treatments  Cognitive Features That Contribute To Risk:  None    Suicide Risk:  Minimal: No identifiable suicidal ideation.  Patients presenting with no risk factors but with morbid ruminations; may be classified as minimal risk based on the severity of the depressive symptoms   Follow-up [facility]. Call.   Specialty: Urgent Care Why: Please call to schedule an appointment with the [facility] on the 2nd floor. Contact information: [address]  Plan Of Care/Follow-up recommendations:  Activity:  as tolerated Diet:  Regular  Medical Decision Making: Patient denies si/hi/avh and no mention of paranoia.  Patient reports that he came to the ER to fill prescription for Seroquel.  Patient believes that without Seroquel he will not sleep.  We discussed close monitoring of his QTC interval by seeing his PCP and Cardiologist.  Patient Verbalized understanding.  He is Psychiatrically cleared.  Prescription is given for two weeks supply of Seroquel 50 mg and Trazodone 50 mg .  Disposition: Psychiatrically cleared.  Delfin Gant, NP-PMHNP-BC 06/27/2022, 10:52 AM

## 2022-06-27 NOTE — Progress Notes (Signed)
TOC consulted to provide shelter resources. This CSW provided and attached shelter resources to AVS. This CSW also attached [facility]. Brie, EMT, states pt will need to be transported back via [facility]. She will arrange transport. TOC signing off.

## 2022-06-27 NOTE — ED Notes (Signed)
At present time, trying to locate folding w/c in order to get pt transported to his residence by safe transport.

## 2022-07-07 ENCOUNTER — Emergency Department (HOSPITAL_COMMUNITY)
Admission: EM | Admit: 2022-07-07 | Discharge: 2022-07-08 | Disposition: A | Discharge status: Home / Self Care | Payer: Medicare Other | Attending: Emergency Medicine | Admitting: Emergency Medicine

## 2022-07-07 ENCOUNTER — Emergency Department (HOSPITAL_COMMUNITY): Payer: Medicare Other

## 2022-07-07 DIAGNOSIS — L03116 Cellulitis of left lower limb: Secondary | ICD-10-CM | POA: Diagnosis not present

## 2022-07-07 DIAGNOSIS — M25462 Effusion, left knee: Secondary | ICD-10-CM | POA: Diagnosis present

## 2022-07-07 DIAGNOSIS — E872 Acidosis, unspecified: Secondary | ICD-10-CM | POA: Insufficient documentation

## 2022-07-07 LAB — CBC WITH DIFFERENTIAL/PLATELET
Abs Immature Granulocytes: 0.02 10*3/uL (ref 0.00–0.07)
Basophils Absolute: 0 10*3/uL (ref 0.0–0.1)
Basophils Relative: 0 %
Eosinophils Absolute: 0.2 10*3/uL (ref 0.0–0.5)
Eosinophils Relative: 3 %
HCT: 35.7 % — ABNORMAL LOW (ref 39.0–52.0)
Hemoglobin: 12.3 g/dL — ABNORMAL LOW (ref 13.0–17.0)
Immature Granulocytes: 0 %
Lymphocytes Relative: 18 %
Lymphs Abs: 1.3 10*3/uL (ref 0.7–4.0)
MCH: 33.2 pg (ref 26.0–34.0)
MCHC: 34.5 g/dL (ref 30.0–36.0)
MCV: 96.5 fL (ref 80.0–100.0)
Monocytes Absolute: 0.7 10*3/uL (ref 0.1–1.0)
Monocytes Relative: 10 %
Neutro Abs: 4.9 10*3/uL (ref 1.7–7.7)
Neutrophils Relative %: 69 %
Platelets: 106 10*3/uL — ABNORMAL LOW (ref 150–400)
RBC: 3.7 MIL/uL — ABNORMAL LOW (ref 4.22–5.81)
RDW: 13.6 % (ref 11.5–15.5)
WBC: 7.2 10*3/uL (ref 4.0–10.5)
nRBC: 0 % (ref 0.0–0.2)

## 2022-07-07 LAB — COMPREHENSIVE METABOLIC PANEL
ALT: 14 U/L (ref 0–44)
AST: 22 U/L (ref 15–41)
Albumin: 3 g/dL — ABNORMAL LOW (ref 3.5–5.0)
Alkaline Phosphatase: 78 U/L (ref 38–126)
Anion gap: 10 (ref 5–15)
BUN: 10 mg/dL (ref 8–23)
CO2: 22 mmol/L (ref 22–32)
Calcium: 8.5 mg/dL — ABNORMAL LOW (ref 8.9–10.3)
Chloride: 102 mmol/L (ref 98–111)
Creatinine, Ser: 0.86 mg/dL (ref 0.61–1.24)
GFR, Estimated: >60 mL/min (ref >60)
Glucose, Bld: 233 mg/dL — ABNORMAL HIGH (ref 70–99)
Potassium: 3 mmol/L — ABNORMAL LOW (ref 3.5–5.1)
Sodium: 134 mmol/L — ABNORMAL LOW (ref 135–145)
Total Bilirubin: 1.8 mg/dL — ABNORMAL HIGH (ref 0.3–1.2)
Total Protein: 6 g/dL — ABNORMAL LOW (ref 6.5–8.1)

## 2022-07-07 LAB — LACTIC ACID, PLASMA: Lactic Acid, Venous: 2.4 mmol/L (ref 0.5–1.9)

## 2022-07-07 MED ORDER — SODIUM CHLORIDE 0.9 % IV BOLUS
1000.0000 mL | Freq: Once | INTRAVENOUS | Status: AC
  Administered 2022-07-08: 1000 mL via INTRAVENOUS

## 2022-07-07 MED ORDER — LIDOCAINE-EPINEPHRINE (PF) 2 %-1:200000 IJ SOLN
10.0000 mL | Freq: Once | INTRAMUSCULAR | Status: AC
  Administered 2022-07-07: 10 mL
  Filled 2022-07-07: qty 20

## 2022-07-07 MED ORDER — DOXYCYCLINE HYCLATE 100 MG PO TABS
100.0000 mg | ORAL_TABLET | Freq: Once | ORAL | Status: AC
  Administered 2022-07-07: 100 mg via ORAL
  Filled 2022-07-07: qty 1

## 2022-07-07 NOTE — ED Provider Triage Note (Signed)
Emergency Medicine Provider Triage Evaluation Note  Cory Palmer , a 61 y.o. male  was evaluated in triage.  Pt complains of pain to his left BKA.  Has been going on for couple days.  Says that it is red and painful inside of his joints.  Also requesting to speak to social work regarding his homelessness  Review of Systems  Positive:  Negative:   Physical Exam  BP 123/75 (BP Location: Left Arm)   Pulse 80   Temp 98.2 F (36.8 C) (Oral)   Resp 16   SpO2 100%  Gen:   Awake, no distress   Resp:  Normal effort  MSK:   Moves extremities without difficulty  Other:  Erythema with a central scab, tenderness and warmth to the touch to the anterior left knee.  Does not appear to involve his amputation site.  Medical Decision Making  Medically screening exam initiated at 1:34 PM.  Appropriate orders placed.  Cory Palmer was informed that the remainder of the evaluation will be completed by another provider, this initial triage assessment does not replace that evaluation, and the importance of remaining in the ED until their evaluation is complete.     Saddie Benders, PA-C 07/07/22 1334

## 2022-07-07 NOTE — ED Triage Notes (Addendum)
BIB [facility] for intermittent left leg pain x2 years.  Pt bilateral BKA. Redness noted.  Denies fall, trauma

## 2022-07-07 NOTE — ED Provider Notes (Signed)
[facility]-EMERGENCY DEPT [facility] Emergency Department Provider Note MRN:  [id number]  Arrival date & time: 07/08/22     Chief Complaint   Leg Pain   History of Present Illness   Cory Palmer is a 61 y.o. year-old male presents to the ED with chief complaint of left knee swelling and pain.  He states that he has been having some redness and pain for the past few days.  He denies any injuries.  He denies fevers.    He also states that he sometimes has some chest pains that radiates into his left arm.  He denies this pain now.  He also states that he is out of his seroquel and trazadone, but doesn't have a doctor he can follow-up with.    He asks to speak to social work about section 8 housing.  History provided by patient.   Review of Systems  Pertinent positive and negative review of systems noted in HPI.    Physical Exam   Vitals:   07/07/22 2341 07/08/22 0027  BP:  132/74  Pulse:  90  Resp:  18  Temp: 98.9 F (37.2 C) 98.9 F (37.2 C)  SpO2:  96%    CONSTITUTIONAL:  non toxic-appearing, NAD NEURO:  Alert and oriented x 3, CN 3-12 grossly intact EYES:  eyes equal and reactive ENT/NECK:  Supple, no stridor  CARDIO:  normal rate, regular rhythm, appears well-perfused  PULM:  No respiratory distress, CTAB GI/GU:  non-distended,  MSK/SPINE:  No gross deformities, no edema, moves all extremities  SKIN:  no rash, erythema to left anterior knee concerning for cellulitis or perhaps developing abscess    *Additional and/or pertinent findings included in MDM below  Diagnostic and Interventional Summary    EKG Interpretation  Date/Time:  Thursday July 07 2022 13:24:21 EST Ventricular Rate:  75 PR Interval:    QRS Duration: 110 QT Interval:  414 QTC Calculation: 463 R Axis:   65 Text Interpretation: Atrial fibrillation Nonspecific T abnormalities, lateral leads No significant change since prior 11/23 Confirmed by Meridee Score [phone]) on 07/07/2022 2:07:32  PM       Labs Reviewed  CBC WITH DIFFERENTIAL/PLATELET - Abnormal; Notable for the following components:      Result Value   RBC 3.70 (*)    Hemoglobin 12.3 (*)    HCT 35.7 (*)    Platelets 106 (*)    All other components within normal limits  LACTIC ACID, PLASMA - Abnormal; Notable for the following components:   Lactic Acid, Venous 2.4 (*)    All other components within normal limits  COMPREHENSIVE METABOLIC PANEL - Abnormal; Notable for the following components:   Sodium 134 (*)    Potassium 3.0 (*)    Glucose, Bld 233 (*)    Calcium 8.5 (*)    Total Protein 6.0 (*)    Albumin 3.0 (*)    Total Bilirubin 1.8 (*)    All other components within normal limits  LACTIC ACID, PLASMA  TROPONIN I (HIGH SENSITIVITY)  TROPONIN I (HIGH SENSITIVITY)    DG Knee Complete 4 Views Left  Final Result      Medications  lidocaine-EPINEPHrine (XYLOCAINE W/EPI) 2 %-1:200000 (PF) injection 10 mL (10 mLs Infiltration Given 07/07/22 2323)  doxycycline (VIBRA-TABS) tablet 100 mg (100 mg Oral Given 07/07/22 2335)  sodium chloride 0.9 % bolus 1,000 mL (0 mLs Intravenous Stopped 07/08/22 0112)  potassium chloride SA (KLOR-CON M) CR tablet 40 mEq (40 mEq Oral Given 07/08/22 0027)  Procedures  /  Critical Care Ultrasound ED Soft Tissue  Date/Time: 07/07/2022 11:23 PM  Performed by: Montine Circle, PA-C Authorized by: Montine Circle, PA-C   Procedure details:    Indications: limb pain, localization of abscess and evaluate for cellulitis     Transverse view:  Visualized   Longitudinal view:  Visualized   Images: archived   Location:    Location: lower extremity     Side:  Left Findings:     cellulitis present Comments:     Questionable small fluid collection, cobblestoning consistent with cellulitis .Marland KitchenIncision and Drainage  Date/Time: 07/07/2022 11:24 PM  Performed by: Montine Circle, PA-C Authorized by: Montine Circle, PA-C   Consent:    Consent obtained:  Verbal   Consent  given by:  Patient   Risks discussed:  Bleeding, incomplete drainage and pain   Alternatives discussed:  No treatment Universal protocol:    Procedure explained and questions answered to patient or proxy's satisfaction: yes     Relevant documents present and verified: yes     Test results available : yes     Imaging studies available: yes     Required blood products, implants, devices, and special equipment available: yes     Site/side marked: yes     Immediately prior to procedure, a time out was called: yes     Patient identity confirmed:  Verbally with patient Location:    Type:  Abscess   Size:  Small Pre-procedure details:    Skin preparation:  Betadine Anesthesia:    Anesthesia method:  Local infiltration   Local anesthetic:  Lidocaine 1% WITH epi Procedure type:    Complexity:  Simple Procedure details:    Needle aspiration: yes     Needle size:  18 G   Incision depth:  Subcutaneous   Drainage characteristics: none.   Drainage amount: none. Post-procedure details:    Procedure completion:  Tolerated well, no immediate complications   ED Course and Medical Decision Making  I have reviewed the triage vital signs, the nursing notes, and pertinent available records from the EMR.  Social Determinants Affecting Complexity of Care: Patient has no clinically significant social determinants affecting this chief complaint..   ED Course:    Medical Decision Making Patient here with left knee pain.  He has noticed some redness and swelling to the anterior portion of his left knee.  He is able to flex the knee.  On physical exam, I have a low degree of suspicion for septic arthritis.  He does have some local skin changes that seem consistent with cellulitis.  I imaged the area with bedside ultrasound and it was consistent with cellulitis.  I thought there might have been a small pocket of fluid, which I attempted to aspirate, but was unsuccessful.  I do think that he will need  treatment with antibiotics.  We have consulted social work, and they saw the patient.  I have refilled his Seroquel and trazodone.    Amount and/or Complexity of Data Reviewed Labs: ordered.    Details: Lactic initially 2.4, now 1.7 after liter fluid.  He does not appear toxic. No sniffing and leukocytosis Troponins are 3 and 3  Risk Prescription drug management. Decision regarding hospitalization.     Consultants: No consultations were needed in caring for this patient.   Treatment and Plan: I considered admission due to patient's initial presentation, but after considering the examination and diagnostic results, patient will not require admission and can be discharged with  outpatient follow-up.    Final Clinical Impressions(s) / ED Diagnoses     ICD-10-CM   1. Cellulitis of left lower extremity  L03.116       ED Discharge Orders          Ordered    doxycycline (VIBRAMYCIN) 100 MG capsule  2 times daily        07/08/22 0156    QUEtiapine (SEROQUEL) 50 MG tablet  Daily at bedtime        07/08/22 0156    traZODone (DESYREL) 50 MG tablet  At bedtime PRN        07/08/22 0156              Discharge Instructions Discussed with and Provided to Patient:   Discharge Instructions   None      Montine Circle, PA-C 07/08/22 0207    Ripley Fraise, MD 07/08/22 0403

## 2022-07-07 NOTE — Social Work (Signed)
CSW has added resources to the patient's chart.

## 2022-07-08 ENCOUNTER — Other Ambulatory Visit (HOSPITAL_COMMUNITY): Payer: Self-pay

## 2022-07-08 LAB — LACTIC ACID, PLASMA: Lactic Acid, Venous: 1.7 mmol/L (ref 0.5–1.9)

## 2022-07-08 LAB — TROPONIN I (HIGH SENSITIVITY)
Troponin I (High Sensitivity): 3 ng/L (ref <18)
Troponin I (High Sensitivity): 3 ng/L (ref <18)

## 2022-07-08 MED ORDER — TRAZODONE HCL 50 MG PO TABS
50.0000 mg | ORAL_TABLET | Freq: Every evening | ORAL | 0 refills | Status: DC | PRN
  Filled 2022-07-08 – 2022-08-22 (×2): qty 15, 15d supply, fill #0

## 2022-07-08 MED ORDER — POTASSIUM CHLORIDE CRYS ER 20 MEQ PO TBCR
40.0000 meq | EXTENDED_RELEASE_TABLET | Freq: Once | ORAL | Status: AC
  Administered 2022-07-08: 40 meq via ORAL
  Filled 2022-07-08: qty 2

## 2022-07-08 MED ORDER — DOXYCYCLINE HYCLATE 100 MG PO CAPS
100.0000 mg | ORAL_CAPSULE | Freq: Two times a day (BID) | ORAL | 0 refills | Status: DC
  Filled 2022-07-08: qty 20, 10d supply, fill #0

## 2022-07-08 MED ORDER — QUETIAPINE FUMARATE 50 MG PO TABS
50.0000 mg | ORAL_TABLET | Freq: Every day | ORAL | 0 refills | Status: DC
  Filled 2022-07-08 – 2022-08-22 (×2): qty 15, 15d supply, fill #0

## 2022-07-08 NOTE — ED Notes (Signed)
Called [facility] for transport to [facility] since patient is bilateral BKA and doesn't have wheelchair because its at [facility], PTAR states they can't transport him and to call safe transports.  Reported to Press photographer and social worker to ensure this is something safe transport can assist with.

## 2022-07-08 NOTE — ED Notes (Signed)
Safe transport called for transportation back to [facility] by wheelchair Zenaida Niece

## 2022-07-08 NOTE — Progress Notes (Signed)
Transition of Care [facility]) - Emergency Department Mini Assessment   Patient Details  Name: Cory Palmer MRN: [id number] Date of Birth: 1961/03/31  Transition of Care [facility]) CM/SW Contact:    Cory Ion, LCSW Phone Number: 07/08/2022, 8:53 AM   Clinical Narrative: Pt presented to ed with leg pain. Per Cory Horseman, PA's note, the pt requested to speak with Social Work to discuss section 8 housing. After chart review, it was noted that TOC had provided and attached several resources for shelter, transportation, and social services on 12/7. This CSW contacted the pt's nurse, Cory Sheldon, RN, this morning who reported the pt was refusing to leave until he spoke with a Child psychotherapist. This CSW went to speak with the pt and informed him that he will need to contact DSS to discuss section 8 housing. The pt reports he has a voucher and just needs to contact his section 8 case worker to secure housing. Pt has no other questions or concerns for TOC. Pt to d/c home via [facility]. TOC signing off.    ED Mini Assessment: What brought you to the Emergency Department? : leg pain/swelling  Barriers to Discharge: No Barriers Identified     Means of departure: Ambulance       Patient Contact and Communications Key Contact 1: Patient   Spoke with: Patient Contact Date: 07/08/22,                 Admission diagnosis:  Leg Pain Patient Active Problem List   Diagnosis Date Noted   Suicide ideation 06/26/2022   Alcohol abuse 04/10/2022   Cocaine abuse with cocaine-induced mood disorder (HCC) 04/10/2022   Adjustment disorder with mixed anxiety and depressed mood 04/10/2022   Alcohol-induced mood disorder (HCC) 04/10/2022   Severe sepsis (HCC) 03/24/2022   Abdominal pain 03/24/2022   Nausea vomiting and diarrhea 03/24/2022   Prolonged QT interval 03/24/2022   Macrocytosis without anemia 03/24/2022   Sepsis (HCC) 03/24/2022   Acute hepatic encephalopathy (HCC) 11/17/2021   PVD (peripheral  vascular disease) (HCC) 11/17/2021   Metabolic acidosis 11/17/2021   Esophagitis 11/17/2021   Acute metabolic encephalopathy 11/11/2021   Thrombocytopenia (HCC) 11/11/2021   Leukocytosis 10/10/2020   GERD (gastroesophageal reflux disease) 10/10/2020   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HCC) 05/06/2020   Hyponatremia 10/23/2019   MRSA bacteremia 10/21/2019   Atrial flutter with rapid ventricular response (HCC) 10/21/2019   Lactic acidosis 10/21/2019   Severe protein-calorie malnutrition (HCC)    Diabetic polyneuropathy associated with type 2 diabetes mellitus (HCC)    Cirrhosis of liver (HCC) 10/19/2019   Anemia 10/19/2019   Medication monitoring encounter 08/15/2019   Serum total bilirubin elevated 10/29/2018   BPH (benign prostatic hyperplasia) 10/29/2018   Below-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)    PAF (paroxysmal atrial fibrillation) (HCC)    Hypokalemia    Hypomagnesemia    Sepsis without acute organ dysfunction (HCC) 10/05/2018   Depressed bipolar disorder (HCC) 10/05/2018   Hypertension 10/05/2018   Hyperbilirubinemia 10/05/2018   Homelessness 05/07/2018   Diabetes mellitus type II, non insulin dependent (HCC) 05/07/2018   PCP:  Pcp, No Pharmacy:   [facility] [address] [place] [address] Phone: [phone] Fax: [phone]  [facility] [address] [place] [address] Phone: [phone] Fax: [phone]  [facility] [place], [place] - [address] [place] [address] Phone: [phone] Fax: [phone]  [place] [facility] [address] [place] [address] Phone: [phone] Fax: [phone]

## 2022-07-08 NOTE — Progress Notes (Signed)
Per Morrie Sheldon, RN, [facility] states they cannot transport the pt to the [facility]. This CSW contacted the [facility] to verify that the pt's wheelchair is there. They confirmed. This CSW contact [facility] to retrieve the wheelchair and bring to the ED. Safe transport (wheelchair transport) will be called once the wheelchair is obtained to transport the pt back to [facility].

## 2022-07-13 ENCOUNTER — Telehealth: Payer: Self-pay | Admitting: Orthopedic Surgery

## 2022-07-13 NOTE — Telephone Encounter (Signed)
Patient called asked if he can get a Rx sent over to [facility] so that he can get his prosthetic legs. The number to contact patient is [phone]

## 2022-07-13 NOTE — Telephone Encounter (Signed)
Pt is a left BKA can you please write order for prosthetic and supplies

## 2022-07-19 ENCOUNTER — Telehealth: Payer: Self-pay | Admitting: Family

## 2022-07-19 NOTE — Telephone Encounter (Signed)
FYI Patient called I advised that I spoke with Autumn and [facility] was contacted and everything is ok and all patient need to do is contact [facility] to make an appointment for casting.Marland Kitchen Phone# to contact patient is needed is [phone]

## 2022-07-20 ENCOUNTER — Other Ambulatory Visit (HOSPITAL_COMMUNITY): Payer: Self-pay

## 2022-08-13 ENCOUNTER — Emergency Department (HOSPITAL_COMMUNITY)
Admission: EM | Admit: 2022-08-13 | Discharge: 2022-08-15 | Disposition: A | Discharge status: Home / Self Care | Payer: 59 | Attending: Emergency Medicine | Admitting: Emergency Medicine

## 2022-08-13 DIAGNOSIS — R112 Nausea with vomiting, unspecified: Secondary | ICD-10-CM | POA: Diagnosis present

## 2022-08-13 DIAGNOSIS — Z5900 Homelessness unspecified: Secondary | ICD-10-CM | POA: Diagnosis not present

## 2022-08-13 DIAGNOSIS — R197 Diarrhea, unspecified: Secondary | ICD-10-CM | POA: Diagnosis not present

## 2022-08-13 LAB — CBC WITH DIFFERENTIAL/PLATELET
Abs Immature Granulocytes: 0.02 10*3/uL (ref 0.00–0.07)
Basophils Absolute: 0 10*3/uL (ref 0.0–0.1)
Basophils Relative: 0 %
Eosinophils Absolute: 0.4 10*3/uL (ref 0.0–0.5)
Eosinophils Relative: 5 %
HCT: 38.7 % — ABNORMAL LOW (ref 39.0–52.0)
Hemoglobin: 13.2 g/dL (ref 13.0–17.0)
Immature Granulocytes: 0 %
Lymphocytes Relative: 17 %
Lymphs Abs: 1.3 10*3/uL (ref 0.7–4.0)
MCH: 33 pg (ref 26.0–34.0)
MCHC: 34.1 g/dL (ref 30.0–36.0)
MCV: 96.8 fL (ref 80.0–100.0)
Monocytes Absolute: 0.7 10*3/uL (ref 0.1–1.0)
Monocytes Relative: 9 %
Neutro Abs: 5.1 10*3/uL (ref 1.7–7.7)
Neutrophils Relative %: 69 %
Platelets: 106 10*3/uL — ABNORMAL LOW (ref 150–400)
RBC: 4 MIL/uL — ABNORMAL LOW (ref 4.22–5.81)
RDW: 13.7 % (ref 11.5–15.5)
WBC: 7.5 10*3/uL (ref 4.0–10.5)
nRBC: 0 % (ref 0.0–0.2)

## 2022-08-13 LAB — COMPREHENSIVE METABOLIC PANEL
ALT: 13 U/L (ref 0–44)
AST: 26 U/L (ref 15–41)
Albumin: 3.2 g/dL — ABNORMAL LOW (ref 3.5–5.0)
Alkaline Phosphatase: 76 U/L (ref 38–126)
Anion gap: 10 (ref 5–15)
BUN: 10 mg/dL (ref 8–23)
CO2: 21 mmol/L — ABNORMAL LOW (ref 22–32)
Calcium: 8.6 mg/dL — ABNORMAL LOW (ref 8.9–10.3)
Chloride: 107 mmol/L (ref 98–111)
Creatinine, Ser: 0.64 mg/dL (ref 0.61–1.24)
GFR, Estimated: >60 mL/min (ref >60)
Glucose, Bld: 172 mg/dL — ABNORMAL HIGH (ref 70–99)
Potassium: 3.6 mmol/L (ref 3.5–5.1)
Sodium: 138 mmol/L (ref 135–145)
Total Bilirubin: 1.3 mg/dL — ABNORMAL HIGH (ref 0.3–1.2)
Total Protein: 6.4 g/dL — ABNORMAL LOW (ref 6.5–8.1)

## 2022-08-13 MED ORDER — LACTATED RINGERS IV BOLUS
1000.0000 mL | Freq: Once | INTRAVENOUS | Status: AC
  Administered 2022-08-13: 1000 mL via INTRAVENOUS

## 2022-08-13 MED ORDER — LOPERAMIDE HCL 2 MG PO CAPS
4.0000 mg | ORAL_CAPSULE | Freq: Once | ORAL | Status: AC
  Administered 2022-08-13: 4 mg via ORAL
  Filled 2022-08-13: qty 2

## 2022-08-13 NOTE — ED Provider Notes (Signed)
  Physical Exam  BP 131/69   Pulse 79   Temp 98.7 F (37.1 C) (Oral)   Resp 18   SpO2 97%   Physical Exam  Procedures  Procedures  ED Course / MDM    Medical Decision Making Amount and/or Complexity of Data Reviewed Labs: ordered.  Risk Prescription drug management.   62 yo male, homeless, chronic suicidal ideation, requesting med refill but last time he came to ED had a med refill but did not pick it up. Left wheelchair at [facility]. SW to help get wheelchair/transportation. Can go to [facility] when gets wheelchair.

## 2022-08-13 NOTE — ED Notes (Signed)
SW messaged regarding pt disposition plan.

## 2022-08-13 NOTE — ED Triage Notes (Signed)
[facility]. States that he has not been eating - homeless. Reports that he has not been able to get meds. Chronic pain.

## 2022-08-13 NOTE — ED Notes (Signed)
Called and spoke with [facility] and they advised they would not be able to assist pt to [facility] where his wheelchair is.

## 2022-08-13 NOTE — ED Provider Notes (Signed)
[place] DEPT Provider Note   CSN: [id number] Arrival date & time: 08/13/22  1954     History  Chief Complaint  Patient presents with   Pain   Emesis   Nausea    Cory Palmer is a 62 y.o. male.  HPI     62 year old male comes in with chief complaint of nausea, vomiting, pain, diarrhea.  Patient states that he has been feeling sick for the last several days.  He essentially has nausea, anorexia and diarrhea.  He has not been sleeping well.  He is complaining that he is not getting trazodone and Seroquel that he was prescribed when he was 18.  He went to [facility] in the told him that they will not prescribe him this medications, that he does not need those medications.  He was seen in the ER in November.  He was given 2 weeks worth of supply, but he never picked it up because of ride issues.  He does not see [facility] anymore.  He has not been able to follow-up with PCP.    Home Medications Prior to Admission medications   Medication Sig Start Date End Date Taking? Authorizing Provider  amLODipine (NORVASC) 5 MG tablet Take 1 tablet (5 mg total) by mouth daily. Patient not taking: Reported on 06/26/2022 05/04/22   Elsie Stain, MD  doxycycline (VIBRAMYCIN) 100 MG capsule Take 1 capsule (100 mg total) by mouth 2 (two) times daily. 07/08/22   Montine Circle, PA-C  hydrOXYzine (VISTARIL) 25 MG capsule Take 1 capsule (25 mg total) by mouth at bedtime. Patient not taking: Reported on 06/26/2022 05/16/22   Ladell Pier, MD  loperamide (IMODIUM A-D) 2 MG capsule Take 1 capsule (2 mg total) by mouth every 8 (eight) hours as needed for diarrhea or loose stools. Patient not taking: Reported on 06/26/2022 05/16/22   Ladell Pier, MD  metFORMIN (GLUCOPHAGE) 1000 MG tablet Take 1 tablet (1,000 mg total) by mouth 2 (two) times daily with a meal. Patient not taking: Reported on 06/26/2022 03/29/22 06/04/22  Little Ishikawa, MD   metoprolol succinate (TOPROL-XL) 25 MG 24 hr tablet Take 0.5 tablets (12.5 mg total) by mouth daily. Patient not taking: Reported on 06/26/2022 05/16/22   Ladell Pier, MD  pravastatin (PRAVACHOL) 40 MG tablet Take 1 tablet (40 mg total) by mouth daily. Patient not taking: Reported on 06/26/2022 05/04/22   Elsie Stain, MD  QUEtiapine (SEROQUEL) 50 MG tablet Take 1 tablet (50 mg total) by mouth at bedtime for 15 days. 07/08/22 07/23/22  Montine Circle, PA-C  sertraline (ZOLOFT) 50 MG tablet Take 1 tablet (50 mg total) by mouth daily. Patient not taking: Reported on 06/26/2022 05/04/22   Elsie Stain, MD  traZODone (DESYREL) 50 MG tablet Take 1 tablet (50 mg total) by mouth at bedtime as needed for up to 15 days for sleep. 07/08/22 07/23/22  Montine Circle, PA-C      Allergies    Atorvastatin calcium, Gemfibrozil, and Insulin glargine    Review of Systems   Review of Systems  Physical Exam Updated Vital Signs BP 131/69   Pulse 79   Temp 98.7 F (37.1 C) (Oral)   Resp 18   SpO2 97%  Physical Exam Vitals and nursing note reviewed.  Constitutional:      Appearance: He is well-developed.  HENT:     Head: Atraumatic.  Cardiovascular:     Rate and Rhythm: Normal rate.  Pulmonary:  Effort: Pulmonary effort is normal.  Musculoskeletal:     Cervical back: Neck supple.  Skin:    General: Skin is warm.  Neurological:     Mental Status: He is alert and oriented to person, place, and time.  Psychiatric:        Mood and Affect: Mood normal.        Behavior: Behavior normal.        Thought Content: Thought content normal.        Judgment: Judgment normal.     ED Results / Procedures / Treatments   Labs (all labs ordered are listed, but only abnormal results are displayed) Labs Reviewed  COMPREHENSIVE METABOLIC PANEL - Abnormal; Notable for the following components:      Result Value   CO2 21 (*)    Glucose, Bld 172 (*)    Calcium 8.6 (*)    Total Protein  6.4 (*)    Albumin 3.2 (*)    Total Bilirubin 1.3 (*)    All other components within normal limits  CBC WITH DIFFERENTIAL/PLATELET - Abnormal; Notable for the following components:   RBC 4.00 (*)    HCT 38.7 (*)    Platelets 106 (*)    All other components within normal limits    EKG EKG Interpretation  Date/Time:  Saturday August 13 2022 21:13:08 EST Ventricular Rate:  80 PR Interval:    QRS Duration: 108 QT Interval:  387 QTC Calculation: 447 R Axis:   78 Text Interpretation: Atrial fibrillation Anteroseptal infarct, age indeterminate No acute changes No significant change since last tracing Confirmed by Derwood Kaplan [phone]) on 08/13/2022 9:56:17 PM  Radiology No results found.  Procedures Procedures    Medications Ordered in ED Medications  lactated ringers bolus 1,000 mL (1,000 mLs Intravenous New Bag/Given 08/13/22 2200)  loperamide (IMODIUM) capsule 4 mg (4 mg Oral Given 08/13/22 2159)    ED Course/ Medical Decision Making/ A&P                             Medical Decision Making Amount and/or Complexity of Data Reviewed Labs: ordered.  Risk Prescription drug management.  62 year old male comes in with chief complaint of nausea, vomiting and also mentions request for refills.  He denies any SI, HI directly.  When I was starting to discuss disposition, that includes likely discharge he mention he was having suicidal thoughts.  I have very low suspicion for serious suicide risk.  Patient is homeless, it appears that there is some ulterior motive.  I reviewed patient's notes from PCP and also his ED visit which included psych evaluation in November.  At that time they had prescribed him 2 weeks of trazodone and Seroquel, but patient did not pick them up.  We ordered basic labs and EKG.  EKG is not showing QT prolongation.  Labs are reassuring.  I discussed with the patient that it might be ideal for him to see [facility] to get established with a psychiatry team who  can assess him properly and see if he needs psychiatric medications.  Patient states that he wishes no longer to see [facility].  He wants to see his PCP, but unsure if his insurance will cover it and also mentions transport limitations.  Given the social determinants of health which includes homelessness and transport issues, I called [facility] to see if they can see the patient and try to establish him for outpatient management.  They agreed.  However patient came without his wheelchair from [facility].  [facility] is not ideal for him to board.  Therefore, patient will stay in the emergency room.  Social work consult has been placed. If the wheelchair makes it to the emergency room, we can work on safe transfer to get him to [place].  Otherwise, patient can be discharged to [facility] where he can get his wheelchair.  He does not need emergent psychiatric evaluation.  Final Clinical Impression(s) / ED Diagnoses Final diagnoses:  Homeless    Rx / DC Orders ED Discharge Orders     None         Varney Biles, MD 08/13/22 2338

## 2022-08-14 NOTE — Progress Notes (Signed)
TOC CSW spoke with Gerald Stabs @ [employer] [phone] in regards to the delivery of pts wheelchair.  Gerald Stabs stated someone would be delivering wheelchair soon.  Arseniy Toomey Tarpley-Carter, MSW, LCSW-A Pronouns:  She/Her/Hers [facility] Clinical Social Worker Direct Number:  [phone] [email or web address]

## 2022-08-14 NOTE — Progress Notes (Signed)
TOC CSW reached out to [facility].  Gerald Stabs stated driver attempted to pick up wheelchair for pt but every wheelchair belonged to someone at the warming station @ [facility].    @ 10:31am CSW contacted [employer] @ [place] [phone].  Carmell Austria stated there was no employee's at the [place] @ [facility] today.  It would have to be done tomorrow when someone can be present.  Otto Caraway Tarpley-Carter, MSW, LCSW-A Pronouns:  She/Her/Hers [facility] Clinical Social Worker Direct Number:  [phone] [email or web address]

## 2022-08-14 NOTE — Progress Notes (Signed)
TOC CSW attempted to contact [facility] [phone] for [facility]  There was no answer.  CSW will attempt to call again later.  Surena Welge Tarpley-Carter, MSW, LCSW-A Pronouns:  She/Her/Hers [facility] Clinical Social Worker Direct Number:  [phone] [email or web address]

## 2022-08-15 NOTE — ED Notes (Addendum)
[facility] transport called [facility]. They are aware of patient being wheelchair bound and are bringing a wheelchair to pick patient up in.

## 2022-08-15 NOTE — ED Notes (Signed)
[facility] , pt will just need transportation there , call [phone] knows the location of the chair

## 2022-08-15 NOTE — Progress Notes (Signed)
TOC consulted to assist in retrieving pt's wheelchair. Per chart review, RN has called safe transport and the pt will be transported back to [facility] via wheelchair transport where he will obtain his own wheelchair. No further TOC needs at this time.

## 2022-08-15 NOTE — ED Provider Notes (Signed)
Emergency Medicine Observation Re-evaluation Note  Cory Palmer is a 62 y.o. male, seen on rounds today.  Pt initially presented to the ED for complaints of Pain, Emesis, and Nausea Currently, the patient is in no acute distress.  Physical Exam  BP 122/63 (BP Location: Left Arm)   Pulse 78   Temp 97.8 F (36.6 C) (Oral)   Resp 18   SpO2 95%  Physical Exam Alert and oriented x 4  ED Course / MDM  EKG:EKG Interpretation  Date/Time:  Saturday August 13 2022 21:13:08 EST Ventricular Rate:  80 PR Interval:    QRS Duration: 108 QT Interval:  387 QTC Calculation: 447 R Axis:   78 Text Interpretation: Atrial fibrillation Anteroseptal infarct, age indeterminate No acute changes No significant change since last tracing Confirmed by Varney Biles [phone]) on 08/13/2022 9:56:17 PM  I have reviewed the labs performed to date as well as medications administered while in observation.  Recent changes in the last 24 hours include none.  Plan  Current plan is for go to be hide after he gets his wheelchair.    Milton Ferguson, MD 08/15/22 [phone]

## 2022-08-16 ENCOUNTER — Other Ambulatory Visit: Payer: Self-pay

## 2022-08-16 ENCOUNTER — Emergency Department (HOSPITAL_COMMUNITY)
Admission: EM | Admit: 2022-08-16 | Discharge: 2022-08-17 | Disposition: A | Discharge status: Home / Self Care | Payer: 59 | Attending: Emergency Medicine | Admitting: Emergency Medicine

## 2022-08-16 ENCOUNTER — Encounter (HOSPITAL_COMMUNITY): Payer: Self-pay | Admitting: Emergency Medicine

## 2022-08-16 ENCOUNTER — Ambulatory Visit: Payer: Self-pay | Admitting: Internal Medicine

## 2022-08-16 DIAGNOSIS — Z7984 Long term (current) use of oral hypoglycemic drugs: Secondary | ICD-10-CM | POA: Diagnosis not present

## 2022-08-16 DIAGNOSIS — R45851 Suicidal ideations: Secondary | ICD-10-CM | POA: Diagnosis not present

## 2022-08-16 DIAGNOSIS — E119 Type 2 diabetes mellitus without complications: Secondary | ICD-10-CM | POA: Diagnosis not present

## 2022-08-16 DIAGNOSIS — Z79899 Other long term (current) drug therapy: Secondary | ICD-10-CM | POA: Diagnosis not present

## 2022-08-16 DIAGNOSIS — I10 Essential (primary) hypertension: Secondary | ICD-10-CM | POA: Diagnosis not present

## 2022-08-16 NOTE — ED Triage Notes (Signed)
Pt states he was taken off his medication for approx 5 months and since then he has been experiencing depression, anxiety, suicidal thoughts, and homicidal thoughts. Pt also c/o of rash on head for a couple months.
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
## 2022-08-17 LAB — URINALYSIS, ROUTINE W REFLEX MICROSCOPIC
Bilirubin Urine: NEGATIVE
Glucose, UA: NEGATIVE mg/dL
Hgb urine dipstick: NEGATIVE
Ketones, ur: NEGATIVE mg/dL
Leukocytes,Ua: NEGATIVE
Nitrite: NEGATIVE
Protein, ur: NEGATIVE mg/dL
Specific Gravity, Urine: 1.009 (ref 1.005–1.030)
pH: 5 (ref 5.0–8.0)

## 2022-08-17 LAB — RAPID URINE DRUG SCREEN, HOSP PERFORMED
Amphetamines: NOT DETECTED
Barbiturates: NOT DETECTED
Benzodiazepines: NOT DETECTED
Cocaine: NOT DETECTED
Opiates: NOT DETECTED
Tetrahydrocannabinol: NOT DETECTED

## 2022-08-17 NOTE — ED Notes (Signed)
[facility] to call when they arrive per RN verbal instruction.

## 2022-08-17 NOTE — Discharge Instructions (Addendum)
Is important that you follow-up with the behavioral health urgent care, have given you their contact information, as they will be the ones that need to continue monitoring your psychiatric medications.  Come back to the emergency department if you develop chest pain, shortness of breath, severe abdominal pain, uncontrolled nausea, vomiting, diarrhea.

## 2022-08-17 NOTE — ED Notes (Signed)
RN Clinical research associate however they the wheelchair accessible [facility] does not operate until 7am.

## 2022-08-17 NOTE — ED Provider Notes (Signed)
Initially patient came in with complaints of pain nausea and vomiting, initial lab work was unremarkable, patient was about to be discharged when he developed some passive suicidal ideations, at that time the provider felt he would be a good candidate for the behavioral health urgent care but this area is not handicap accessible as he uses a wheelchair, therefore he was unable to transfer him over to the behavioral health unit.  Patient was then kept as a border until we are able to figure out better placements.  Patient was never under IVC at this time he has not been seen by TTS.  Patient was evaluated by social work Curlene Labrum on the 15th,  spoke with safe transport and they are able to transfer back to [facility].  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  He is not endorsing any suicidal homicidal ideations, he denies any hallucinations or delusions, he has good insight, does not appear to be respond to internal stimuli.  He has no access to any firearms at this time.  I explained that I think he would benefit from outpatient psychiatry and he is agreement with this and will follow-up with on his own.  He was given strict return precautions.      Marcello Fennel, PA-C 08/17/22 [id number]    Ripley Fraise, MD 08/17/22 [phone]

## 2022-08-17 NOTE — ED Notes (Signed)
RN contacted safe transport to take pt back to the [facility].  They are aware he is in a wheel chair and have the ability to transport him back.

## 2022-08-22 ENCOUNTER — Other Ambulatory Visit (HOSPITAL_COMMUNITY): Payer: Self-pay

## 2022-08-25 ENCOUNTER — Other Ambulatory Visit: Payer: Self-pay

## 2022-08-25 ENCOUNTER — Inpatient Hospital Stay (HOSPITAL_COMMUNITY)
Admission: EM | Admit: 2022-08-25 | Discharge: 2022-08-30 | DRG: 193 | Discharge status: Against Medical Advice | Payer: 59 | Attending: Internal Medicine | Admitting: Internal Medicine

## 2022-08-25 DIAGNOSIS — I480 Paroxysmal atrial fibrillation: Secondary | ICD-10-CM | POA: Diagnosis present

## 2022-08-25 DIAGNOSIS — E871 Hypo-osmolality and hyponatremia: Secondary | ICD-10-CM | POA: Diagnosis present

## 2022-08-25 DIAGNOSIS — E669 Obesity, unspecified: Secondary | ICD-10-CM | POA: Diagnosis present

## 2022-08-25 DIAGNOSIS — F17210 Nicotine dependence, cigarettes, uncomplicated: Secondary | ICD-10-CM | POA: Diagnosis present

## 2022-08-25 DIAGNOSIS — E1142 Type 2 diabetes mellitus with diabetic polyneuropathy: Secondary | ICD-10-CM

## 2022-08-25 DIAGNOSIS — Z5901 Sheltered homelessness: Secondary | ICD-10-CM

## 2022-08-25 DIAGNOSIS — F319 Bipolar disorder, unspecified: Secondary | ICD-10-CM | POA: Diagnosis present

## 2022-08-25 DIAGNOSIS — Z6834 Body mass index (BMI) 34.0-34.9, adult: Secondary | ICD-10-CM

## 2022-08-25 DIAGNOSIS — Z89512 Acquired absence of left leg below knee: Secondary | ICD-10-CM

## 2022-08-25 DIAGNOSIS — R053 Chronic cough: Secondary | ICD-10-CM | POA: Diagnosis present

## 2022-08-25 DIAGNOSIS — Z5329 Procedure and treatment not carried out because of patient's decision for other reasons: Secondary | ICD-10-CM | POA: Diagnosis present

## 2022-08-25 DIAGNOSIS — Z888 Allergy status to other drugs, medicaments and biological substances status: Secondary | ICD-10-CM

## 2022-08-25 DIAGNOSIS — R634 Abnormal weight loss: Secondary | ICD-10-CM | POA: Diagnosis present

## 2022-08-25 DIAGNOSIS — F419 Anxiety disorder, unspecified: Secondary | ICD-10-CM | POA: Diagnosis present

## 2022-08-25 DIAGNOSIS — J189 Pneumonia, unspecified organism: Principal | ICD-10-CM

## 2022-08-25 DIAGNOSIS — Z79899 Other long term (current) drug therapy: Secondary | ICD-10-CM

## 2022-08-25 DIAGNOSIS — J154 Pneumonia due to other streptococci: Secondary | ICD-10-CM | POA: Diagnosis not present

## 2022-08-25 DIAGNOSIS — Z833 Family history of diabetes mellitus: Secondary | ICD-10-CM

## 2022-08-25 DIAGNOSIS — E1165 Type 2 diabetes mellitus with hyperglycemia: Secondary | ICD-10-CM | POA: Diagnosis present

## 2022-08-25 DIAGNOSIS — I10 Essential (primary) hypertension: Secondary | ICD-10-CM | POA: Diagnosis present

## 2022-08-25 DIAGNOSIS — U071 COVID-19: Secondary | ICD-10-CM | POA: Diagnosis present

## 2022-08-25 DIAGNOSIS — Z8249 Family history of ischemic heart disease and other diseases of the circulatory system: Secondary | ICD-10-CM

## 2022-08-25 LAB — CBC WITH DIFFERENTIAL/PLATELET
Abs Immature Granulocytes: 0 10*3/uL (ref 0.00–0.07)
Basophils Absolute: 0 10*3/uL (ref 0.0–0.1)
Basophils Relative: 0 %
Eosinophils Absolute: 0 10*3/uL (ref 0.0–0.5)
Eosinophils Relative: 0 %
HCT: 38.4 % — ABNORMAL LOW (ref 39.0–52.0)
Hemoglobin: 13.9 g/dL (ref 13.0–17.0)
Lymphocytes Relative: 2 %
Lymphs Abs: 0.3 10*3/uL — ABNORMAL LOW (ref 0.7–4.0)
MCH: 34.2 pg — ABNORMAL HIGH (ref 26.0–34.0)
MCHC: 36.2 g/dL — ABNORMAL HIGH (ref 30.0–36.0)
MCV: 94.3 fL (ref 80.0–100.0)
Monocytes Absolute: 0.2 10*3/uL (ref 0.1–1.0)
Monocytes Relative: 1 %
Neutro Abs: 15.7 10*3/uL — ABNORMAL HIGH (ref 1.7–7.7)
Neutrophils Relative %: 97 %
Platelets: 186 10*3/uL (ref 150–400)
RBC: 4.07 MIL/uL — ABNORMAL LOW (ref 4.22–5.81)
RDW: 13.4 % (ref 11.5–15.5)
WBC: 16.2 10*3/uL — ABNORMAL HIGH (ref 4.0–10.5)
nRBC: 0 % (ref 0.0–0.2)
nRBC: 0 /100 WBC

## 2022-08-25 LAB — URINALYSIS, ROUTINE W REFLEX MICROSCOPIC
Bilirubin Urine: NEGATIVE
Glucose, UA: NEGATIVE mg/dL
Hgb urine dipstick: NEGATIVE
Ketones, ur: NEGATIVE mg/dL
Leukocytes,Ua: NEGATIVE
Nitrite: NEGATIVE
Protein, ur: NEGATIVE mg/dL
Specific Gravity, Urine: 1.021 (ref 1.005–1.030)
pH: 6 (ref 5.0–8.0)

## 2022-08-25 LAB — COMPREHENSIVE METABOLIC PANEL
ALT: 27 U/L (ref 0–44)
AST: 49 U/L — ABNORMAL HIGH (ref 15–41)
Albumin: 2 g/dL — ABNORMAL LOW (ref 3.5–5.0)
Alkaline Phosphatase: 159 U/L — ABNORMAL HIGH (ref 38–126)
Anion gap: 11 (ref 5–15)
BUN: 22 mg/dL (ref 8–23)
CO2: 23 mmol/L (ref 22–32)
Calcium: 8.4 mg/dL — ABNORMAL LOW (ref 8.9–10.3)
Chloride: 92 mmol/L — ABNORMAL LOW (ref 98–111)
Creatinine, Ser: 0.83 mg/dL (ref 0.61–1.24)
GFR, Estimated: >60 mL/min (ref >60)
Glucose, Bld: 222 mg/dL — ABNORMAL HIGH (ref 70–99)
Potassium: 3.6 mmol/L (ref 3.5–5.1)
Sodium: 126 mmol/L — ABNORMAL LOW (ref 135–145)
Total Bilirubin: 3.1 mg/dL — ABNORMAL HIGH (ref 0.3–1.2)
Total Protein: 6.9 g/dL (ref 6.5–8.1)

## 2022-08-25 LAB — RESP PANEL BY RT-PCR (RSV, FLU A&B, COVID)  RVPGX2
Influenza A by PCR: NEGATIVE
Influenza B by PCR: NEGATIVE
Resp Syncytial Virus by PCR: NEGATIVE
SARS Coronavirus 2 by RT PCR: POSITIVE — AB

## 2022-08-25 LAB — LIPASE, BLOOD: Lipase: 28 U/L (ref 11–51)

## 2022-08-25 MED ORDER — SODIUM CHLORIDE 0.9 % IV BOLUS
1000.0000 mL | Freq: Once | INTRAVENOUS | Status: AC
  Administered 2022-08-26: 1000 mL via INTRAVENOUS

## 2022-08-25 NOTE — ED Provider Notes (Signed)
[facility] Provider Note   CSN: [id number] Arrival date & time: 08/16/22  1944     History  Chief Complaint  Patient presents with   Suicidal    Cory Palmer is a 62 y.o. male.  HPI   Medical history including diabetes, anxiety, liver cirrhosis, depression, hypertension presents with suicidal ideation without a plan.  Home Medications Prior to Admission medications   Medication Sig Start Date End Date Taking? Authorizing Provider  amLODipine (NORVASC) 5 MG tablet Take 1 tablet (5 mg total) by mouth daily. Patient not taking: Reported on 06/26/2022 05/04/22   Elsie Stain, MD  doxycycline (VIBRAMYCIN) 100 MG capsule Take 1 capsule (100 mg total) by mouth 2 (two) times daily. 07/08/22   Montine Circle, PA-C  hydrOXYzine (VISTARIL) 25 MG capsule Take 1 capsule (25 mg total) by mouth at bedtime. Patient not taking: Reported on 06/26/2022 05/16/22   Ladell Pier, MD  loperamide (IMODIUM A-D) 2 MG capsule Take 1 capsule (2 mg total) by mouth every 8 (eight) hours as needed for diarrhea or loose stools. Patient not taking: Reported on 06/26/2022 05/16/22   Ladell Pier, MD  metFORMIN (GLUCOPHAGE) 1000 MG tablet Take 1 tablet (1,000 mg total) by mouth 2 (two) times daily with a meal. Patient not taking: Reported on 06/26/2022 03/29/22 06/04/22  Little Ishikawa, MD  metoprolol succinate (TOPROL-XL) 25 MG 24 hr tablet Take 0.5 tablets (12.5 mg total) by mouth daily. Patient not taking: Reported on 06/26/2022 05/16/22   Ladell Pier, MD  pravastatin (PRAVACHOL) 40 MG tablet Take 1 tablet (40 mg total) by mouth daily. Patient not taking: Reported on 06/26/2022 05/04/22   Elsie Stain, MD  QUEtiapine (SEROQUEL) 50 MG tablet Take 1 tablet (50 mg total) by mouth at bedtime for 15 days. 07/08/22 09/06/22  Montine Circle, PA-C  sertraline (ZOLOFT) 50 MG tablet Take 1 tablet (50 mg total) by mouth daily. Patient not  taking: Reported on 06/26/2022 05/04/22   Elsie Stain, MD  traZODone (DESYREL) 50 MG tablet Take 1 tablet (50 mg total) by mouth at bedtime as needed for up to 15 days for sleep. 07/08/22 09/06/22  Montine Circle, PA-C      Allergies    Atorvastatin calcium, Gemfibrozil, and Insulin glargine    Review of Systems   Review of Systems  Constitutional:  Negative for chills and fever.  Respiratory:  Negative for shortness of breath.   Cardiovascular:  Negative for chest pain.  Gastrointestinal:  Negative for abdominal pain.  Neurological:  Negative for headaches.    Physical Exam Updated Vital Signs BP 119/79 (BP Location: Left Arm)   Pulse 89   Temp 98.8 F (37.1 C)   Resp 20   Ht 5\' 8"  (1.727 m)   Wt 106 kg   SpO2 95%   BMI 35.53 kg/m  Physical Exam Vitals and nursing note reviewed.  Constitutional:      General: He is not in acute distress.    Appearance: Normal appearance. He is not ill-appearing.  HENT:     Head: Normocephalic and atraumatic.  Eyes:     Conjunctiva/sclera: Conjunctivae normal.  Cardiovascular:     Rate and Rhythm: Normal rate.  Pulmonary:     Effort: Pulmonary effort is normal.     Breath sounds: Normal breath sounds.  Musculoskeletal:     Cervical back: Neck supple.     Right lower leg: No edema.  Left lower leg: No edema.  Skin:    General: Skin is warm and dry.  Neurological:     Mental Status: He is alert.  Psychiatric:        Mood and Affect: Mood normal.     ED Results / Procedures / Treatments   Labs (all labs ordered are listed, but only abnormal results are displayed) Labs Reviewed  RAPID URINE DRUG SCREEN, HOSP PERFORMED  URINALYSIS, ROUTINE W REFLEX MICROSCOPIC    EKG EKG Interpretation  Date/Time:  Tuesday August 16 2022 20:14:25 EST Ventricular Rate:  70 PR Interval:    QRS Duration: 94 QT Interval:  426 QTC Calculation: 460 R Axis:   60 Text Interpretation: Atrial fibrillation Nonspecific T wave  abnormality Confirmed by Lajean Saver [phone]) on 08/17/2022 4:03:16 PM  Radiology No results found.  Procedures Procedures    Medications Ordered in ED Medications - No data to display  ED Course/ Medical Decision Making/ A&P                             Medical Decision Making  Initially patient came in with complaints of pain nausea and vomiting, initial lab work was unremarkable, patient was about to be discharged when he developed some passive suicidal ideations, at that time the provider felt he would be a good candidate for the behavioral health urgent care but this area is not handicap accessible as he uses a wheelchair, therefore he was unable to transfer him over to the behavioral health unit.  Patient was then kept as a border until we are able to figure out better placements.  Patient was never under IVC at this time he has not been seen by TTS.  Patient was evaluated by social work Curlene Labrum on the 15th,  spoke with safe transport and they are able to transfer back to [facility].  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  He is not endorsing any suicidal homicidal ideations, he denies any hallucinations or delusions, he has good insight, does not appear to be respond to internal stimuli.  He has no access to any firearms at this time.  I explained that I think he would benefit from outpatient psychiatry and he is agreement with this and will follow-up with on his own.  He was given strict return precautions.         Final Clinical Impression(s) / ED Diagnoses Final diagnoses:  Suicidal thoughts    Rx / DC Orders ED Discharge Orders     None         Marcello Fennel, PA-C 08/25/22 [id number]    Ripley Fraise, MD 08/25/22 [phone]

## 2022-08-25 NOTE — ED Provider Triage Note (Signed)
Emergency Medicine Provider Triage Evaluation Note  Cory Palmer , a 62 y.o. male  was evaluated in triage.  Pt has multiple complaints.  He said that he has had been having abdominal pain and nausea and vomiting on and off in the last 5 months.  He also has chronic cough, has been feeling fatigue.  Patient is a bilateral bka.  Denies fever.  Review of Systems  Positive: As above Negative: As above  Physical Exam  BP 101/85 (BP Location: Right Arm)   Pulse 88   Temp 98 F (36.7 C) (Oral)   Resp 15   SpO2 97%  Gen:   Awake, no distress   Resp:  Normal effort  MSK:   Moves extremities without difficulty  Other:    Medical Decision Making  Medically screening exam initiated at 9:07 PM.  Appropriate orders placed.  Cory Palmer was informed that the remainder of the evaluation will be completed by another provider, this initial triage assessment does not replace that evaluation, and the importance of remaining in the ED until their evaluation is complete.    Rex Kras, [place] 08/25/22 [id number]

## 2022-08-25 NOTE — ED Triage Notes (Signed)
[facility] reports chronic ( > 1 year) dry cough with nausea and body aches .

## 2022-08-26 ENCOUNTER — Emergency Department (HOSPITAL_COMMUNITY): Payer: 59

## 2022-08-26 ENCOUNTER — Encounter (HOSPITAL_COMMUNITY): Payer: Self-pay | Admitting: Internal Medicine

## 2022-08-26 DIAGNOSIS — Z833 Family history of diabetes mellitus: Secondary | ICD-10-CM | POA: Diagnosis not present

## 2022-08-26 DIAGNOSIS — E1165 Type 2 diabetes mellitus with hyperglycemia: Secondary | ICD-10-CM | POA: Diagnosis present

## 2022-08-26 DIAGNOSIS — F17210 Nicotine dependence, cigarettes, uncomplicated: Secondary | ICD-10-CM | POA: Diagnosis present

## 2022-08-26 DIAGNOSIS — F319 Bipolar disorder, unspecified: Secondary | ICD-10-CM | POA: Diagnosis present

## 2022-08-26 DIAGNOSIS — R053 Chronic cough: Secondary | ICD-10-CM | POA: Diagnosis present

## 2022-08-26 DIAGNOSIS — Z8249 Family history of ischemic heart disease and other diseases of the circulatory system: Secondary | ICD-10-CM | POA: Diagnosis not present

## 2022-08-26 DIAGNOSIS — Z5901 Sheltered homelessness: Secondary | ICD-10-CM | POA: Diagnosis not present

## 2022-08-26 DIAGNOSIS — E669 Obesity, unspecified: Secondary | ICD-10-CM | POA: Diagnosis present

## 2022-08-26 DIAGNOSIS — I10 Essential (primary) hypertension: Secondary | ICD-10-CM | POA: Diagnosis present

## 2022-08-26 DIAGNOSIS — J189 Pneumonia, unspecified organism: Secondary | ICD-10-CM | POA: Diagnosis not present

## 2022-08-26 DIAGNOSIS — Z6834 Body mass index (BMI) 34.0-34.9, adult: Secondary | ICD-10-CM | POA: Diagnosis not present

## 2022-08-26 DIAGNOSIS — Z89512 Acquired absence of left leg below knee: Secondary | ICD-10-CM | POA: Diagnosis not present

## 2022-08-26 DIAGNOSIS — Z5329 Procedure and treatment not carried out because of patient's decision for other reasons: Secondary | ICD-10-CM | POA: Diagnosis present

## 2022-08-26 DIAGNOSIS — I480 Paroxysmal atrial fibrillation: Secondary | ICD-10-CM | POA: Diagnosis present

## 2022-08-26 DIAGNOSIS — J154 Pneumonia due to other streptococci: Secondary | ICD-10-CM | POA: Diagnosis present

## 2022-08-26 DIAGNOSIS — E871 Hypo-osmolality and hyponatremia: Secondary | ICD-10-CM | POA: Diagnosis present

## 2022-08-26 DIAGNOSIS — Z888 Allergy status to other drugs, medicaments and biological substances status: Secondary | ICD-10-CM | POA: Diagnosis not present

## 2022-08-26 DIAGNOSIS — Z79899 Other long term (current) drug therapy: Secondary | ICD-10-CM | POA: Diagnosis not present

## 2022-08-26 DIAGNOSIS — R634 Abnormal weight loss: Secondary | ICD-10-CM | POA: Diagnosis present

## 2022-08-26 DIAGNOSIS — U071 COVID-19: Secondary | ICD-10-CM | POA: Diagnosis present

## 2022-08-26 DIAGNOSIS — F419 Anxiety disorder, unspecified: Secondary | ICD-10-CM | POA: Diagnosis present

## 2022-08-26 LAB — LACTIC ACID, PLASMA
Lactic Acid, Venous: 3.1 mmol/L (ref 0.5–1.9)
Lactic Acid, Venous: 3.5 mmol/L (ref 0.5–1.9)

## 2022-08-26 LAB — BASIC METABOLIC PANEL
Anion gap: 13 (ref 5–15)
BUN: 21 mg/dL (ref 8–23)
CO2: 19 mmol/L — ABNORMAL LOW (ref 22–32)
Calcium: 7.7 mg/dL — ABNORMAL LOW (ref 8.9–10.3)
Chloride: 96 mmol/L — ABNORMAL LOW (ref 98–111)
Creatinine, Ser: 0.71 mg/dL (ref 0.61–1.24)
GFR, Estimated: >60 mL/min (ref >60)
Glucose, Bld: 182 mg/dL — ABNORMAL HIGH (ref 70–99)
Potassium: 3.5 mmol/L (ref 3.5–5.1)
Sodium: 128 mmol/L — ABNORMAL LOW (ref 135–145)

## 2022-08-26 LAB — STREP PNEUMONIAE URINARY ANTIGEN: Strep Pneumo Urinary Antigen: POSITIVE — AB

## 2022-08-26 LAB — HEMOGLOBIN A1C
Hgb A1c MFr Bld: 6.5 % — ABNORMAL HIGH (ref 4.8–5.6)
Mean Plasma Glucose: 139.85 mg/dL

## 2022-08-26 LAB — CBC
HCT: 35 % — ABNORMAL LOW (ref 39.0–52.0)
Hemoglobin: 11.7 g/dL — ABNORMAL LOW (ref 13.0–17.0)
MCH: 32.6 pg (ref 26.0–34.0)
MCHC: 33.4 g/dL (ref 30.0–36.0)
MCV: 97.5 fL (ref 80.0–100.0)
Platelets: 164 10*3/uL (ref 150–400)
RBC: 3.59 MIL/uL — ABNORMAL LOW (ref 4.22–5.81)
RDW: 13.5 % (ref 11.5–15.5)
WBC: 12.5 10*3/uL — ABNORMAL HIGH (ref 4.0–10.5)
nRBC: 0 % (ref 0.0–0.2)

## 2022-08-26 LAB — CBG MONITORING, ED
Glucose-Capillary: 177 mg/dL — ABNORMAL HIGH (ref 70–99)
Glucose-Capillary: 250 mg/dL — ABNORMAL HIGH (ref 70–99)
Glucose-Capillary: 180 mg/dL — ABNORMAL HIGH (ref 70–99)

## 2022-08-26 LAB — MRSA NEXT GEN BY PCR, NASAL: MRSA by PCR Next Gen: NOT DETECTED

## 2022-08-26 LAB — BRAIN NATRIURETIC PEPTIDE: B Natriuretic Peptide: 134 pg/mL — ABNORMAL HIGH (ref 0.0–100.0)

## 2022-08-26 LAB — GLUCOSE, CAPILLARY: Glucose-Capillary: 284 mg/dL — ABNORMAL HIGH (ref 70–99)

## 2022-08-26 LAB — CREATININE, SERUM
Creatinine, Ser: 0.68 mg/dL (ref 0.61–1.24)
GFR, Estimated: >60 mL/min (ref >60)

## 2022-08-26 LAB — MAGNESIUM: Magnesium: 1.9 mg/dL (ref 1.7–2.4)

## 2022-08-26 LAB — PHOSPHORUS: Phosphorus: 3.9 mg/dL (ref 2.5–4.6)

## 2022-08-26 MED ORDER — INSULIN ASPART 100 UNIT/ML IJ SOLN
0.0000 [IU] | Freq: Three times a day (TID) | INTRAMUSCULAR | Status: DC
  Administered 2022-08-26: 3 [IU] via SUBCUTANEOUS
  Administered 2022-08-26: 5 [IU] via SUBCUTANEOUS
  Administered 2022-08-26: 3 [IU] via SUBCUTANEOUS
  Administered 2022-08-27: 5 [IU] via SUBCUTANEOUS
  Administered 2022-08-27 – 2022-08-28 (×3): 3 [IU] via SUBCUTANEOUS
  Administered 2022-08-28: 11 [IU] via SUBCUTANEOUS
  Administered 2022-08-28: 3 [IU] via SUBCUTANEOUS
  Administered 2022-08-29: 5 [IU] via SUBCUTANEOUS
  Administered 2022-08-29: 2 [IU] via SUBCUTANEOUS
  Administered 2022-08-29: 3 [IU] via SUBCUTANEOUS
  Administered 2022-08-30: 5 [IU] via SUBCUTANEOUS

## 2022-08-26 MED ORDER — ZINC SULFATE 220 (50 ZN) MG PO CAPS
220.0000 mg | ORAL_CAPSULE | Freq: Every day | ORAL | Status: DC
  Administered 2022-08-26 – 2022-08-30 (×5): 220 mg via ORAL
  Filled 2022-08-26 (×5): qty 1

## 2022-08-26 MED ORDER — ENOXAPARIN SODIUM 40 MG/0.4ML IJ SOSY
40.0000 mg | PREFILLED_SYRINGE | Freq: Every day | INTRAMUSCULAR | Status: DC
  Administered 2022-08-26 – 2022-08-30 (×5): 40 mg via SUBCUTANEOUS
  Filled 2022-08-26 (×5): qty 0.4

## 2022-08-26 MED ORDER — ALBUTEROL SULFATE HFA 108 (90 BASE) MCG/ACT IN AERS: 2.0000 | INHALATION_SPRAY | RESPIRATORY_TRACT | Status: DC | PRN

## 2022-08-26 MED ORDER — SODIUM CHLORIDE 0.9 % IV SOLN
200.0000 mg | Freq: Once | INTRAVENOUS | Status: AC
  Administered 2022-08-26: 200 mg via INTRAVENOUS
  Filled 2022-08-26 (×2): qty 40

## 2022-08-26 MED ORDER — VITAMIN C 500 MG PO TABS
500.0000 mg | ORAL_TABLET | Freq: Every day | ORAL | Status: DC
  Administered 2022-08-26 – 2022-08-30 (×5): 500 mg via ORAL
  Filled 2022-08-26 (×5): qty 1

## 2022-08-26 MED ORDER — SODIUM CHLORIDE 0.9 % IV SOLN
1.0000 g | Freq: Once | INTRAVENOUS | Status: AC
  Administered 2022-08-26: 1 g via INTRAVENOUS
  Filled 2022-08-26: qty 10

## 2022-08-26 MED ORDER — ALBUTEROL SULFATE HFA 108 (90 BASE) MCG/ACT IN AERS
2.0000 | INHALATION_SPRAY | RESPIRATORY_TRACT | Status: DC | PRN
  Administered 2022-08-26: 2 via RESPIRATORY_TRACT
  Filled 2022-08-26: qty 6.7

## 2022-08-26 MED ORDER — GUAIFENESIN-DM 100-10 MG/5ML PO SYRP
10.0000 mL | ORAL_SOLUTION | ORAL | Status: DC | PRN
  Administered 2022-08-26 – 2022-08-29 (×4): 10 mL via ORAL
  Filled 2022-08-26 (×4): qty 10

## 2022-08-26 MED ORDER — SODIUM CHLORIDE 0.9 % IV SOLN
2.0000 g | INTRAVENOUS | Status: DC
  Administered 2022-08-26 – 2022-08-29 (×4): 2 g via INTRAVENOUS
  Filled 2022-08-26 (×4): qty 20

## 2022-08-26 MED ORDER — ADULT MULTIVITAMIN W/MINERALS CH
1.0000 | ORAL_TABLET | Freq: Every day | ORAL | Status: DC
  Administered 2022-08-26 – 2022-08-30 (×5): 1 via ORAL
  Filled 2022-08-26 (×5): qty 1

## 2022-08-26 MED ORDER — INSULIN ASPART 100 UNIT/ML IJ SOLN
0.0000 [IU] | Freq: Every day | INTRAMUSCULAR | Status: DC
  Administered 2022-08-26: 3 [IU] via SUBCUTANEOUS
  Administered 2022-08-29: 2 [IU] via SUBCUTANEOUS

## 2022-08-26 MED ORDER — DOXYCYCLINE HYCLATE 100 MG PO TABS
100.0000 mg | ORAL_TABLET | Freq: Once | ORAL | Status: AC
  Administered 2022-08-26: 100 mg via ORAL
  Filled 2022-08-26: qty 1

## 2022-08-26 MED ORDER — SODIUM CHLORIDE 0.9 % IV SOLN
100.0000 mg | Freq: Every day | INTRAVENOUS | Status: AC
  Administered 2022-08-27 – 2022-08-28 (×2): 100 mg via INTRAVENOUS
  Filled 2022-08-26 (×2): qty 20

## 2022-08-26 MED ORDER — DOXYCYCLINE HYCLATE 100 MG PO TABS
100.0000 mg | ORAL_TABLET | Freq: Two times a day (BID) | ORAL | Status: DC
  Administered 2022-08-26 – 2022-08-29 (×8): 100 mg via ORAL
  Filled 2022-08-26 (×8): qty 1

## 2022-08-26 NOTE — ED Notes (Signed)
ED TO INPATIENT HANDOFF REPORT  ED Nurse Name and Phone #: Ivin Poot Name/Age/Gender Cory Palmer 62 y.o. male Room/Bed: 007C/007C  Code Status   Code Status: Full Code  Home/SNF/Other Homeless; shelter Patient oriented to: self, place, time, and situation Is this baseline? Yes   Triage Complete: Triage complete  Chief Complaint Pneumonia [J18.9]  Triage Note Patient arrived with EMS from a warming shelter reports chronic ( > 1 year) dry cough with nausea and body aches .    Allergies Allergies  Allergen Reactions   Atorvastatin Calcium Nausea And Vomiting   Gemfibrozil    Insulin Glargine     Level of Care/Admitting Diagnosis ED Disposition     ED Disposition  Admit   Condition  --   [facility]: [place] [[id number]]  Level of Care: Progressive [102]  Admit to Progressive based on following criteria: RESPIRATORY PROBLEMS hypoxemic/hypercapnic respiratory failure that is responsive to NIPPV (BiPAP) or High Flow Nasal Cannula (6-80 lpm). Frequent assessment/intervention, no > Q2 hrs < Q4 hrs, to maintain oxygenation and pulmonary hygiene.  May [facility] patient to [facility] or [facility] if equivalent level of care is available:: Yes  Covid Evaluation: Asymptomatic - no recent exposure (last 10 days) testing not required  Diagnosis: Pneumonia [227785]  Admitting Physician: Kayleen Memos [id number]  Attending Physician: Kayleen Memos [id number]  Certification:: I certify this patient will need inpatient services for at least 2 midnights  Estimated Length of Stay: 2          B Medical/Surgery History Past Medical History:  Diagnosis Date   Anxiety    Depressed bipolar disorder ([place])    Diabetes mellitus without complication ([place])    Hypertension    Liver cirrhosis ([place])    Osteomyelitis of left foot ([place]) 10/19/2019   Osteomyelitis of toe of left foot ([facility])    PAF (paroxysmal atrial fibrillation) ([place])    S/P  transmetatarsal amputation of foot, 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   Past Surgical History:  Procedure Laterality Date   AMPUTATION Left 09/28/2018   Procedure: LEFT TRANSMETATARSAL AMPUTATION;  Surgeon: Newt Minion, MD;  Location: [place];  Service: Orthopedics;  Laterality: Left;   AMPUTATION Left 10/23/2019   Procedure: AMPUTATION BELOW KNEE;  Surgeon: Newt Minion, MD;  Location: [place];  Service: Orthopedics;  Laterality: Left;   BELOW KNEE LEG AMPUTATION Right    ESOPHAGOGASTRODUODENOSCOPY (EGD) WITH PROPOFOL N/A 10/19/2020   Procedure: ESOPHAGOGASTRODUODENOSCOPY (EGD) WITH PROPOFOL;  Surgeon: Juanita Craver, MD;  Location[facility] END[facility]COPY;  Service: Endoscopy;  Laterality: N/A;   TEE WITHOUT CARDIOVERSION N/A 10/25/2019   Procedure: TRANSESOPHAGEAL ECHOCARDIOGRAM (TEE);  Surgeon: Lelon Perla, MD;  Location: [facility] ENDOSCOPY;  Service: Cardiovascular;  Laterality: N/A;     A IV Location/Drains/Wounds Patient Lines/Drains/Airways Status     Active Line/Drains/Airways     Name Placement date Placement time Site Days   Peripheral IV 08/26/22 20 G Posterior;Right Hand 08/26/22  0038  Hand  less than 1            Intake/Output Last 24 hours  Intake/Output Summary (Last 24 hours) at 08/26/2022 1834 Last data filed at 08/26/2022 0957 Gross per 24 hour  Intake 1450.87 ml  Output 250 ml  Net 1200.87 ml    Labs/Imaging Results for orders placed or performed during the hospital encounter of 08/25/22 (from the past 48 hour(s))  Resp panel by RT-PCR (RSV, Flu A&B, Covid) Anterior Nasal Swab  Status: Abnormal   Collection Time: 08/25/22  9:08 PM   Specimen: Anterior Nasal Swab  Result Value Ref Range   SARS Coronavirus 2 by RT PCR POSITIVE (A) NEGATIVE    Comment: (NOTE) SARS-CoV-2 target nucleic acids are DETECTED.  The SARS-CoV-2 RNA is generally detectable in upper respiratory specimens during the acute phase of infection. Positive results are indicative of the presence of the  identified virus, but do not rule out bacterial infection or co-infection with other pathogens not detected by the test. Clinical correlation with patient history and other diagnostic information is necessary to determine patient infection status. The expected result is Negative.  Fact Sheet for Patients: BloggerCourse.com  Fact Sheet for Healthcare Providers: SeriousBroker.it  This test is not yet approved or cleared by the [place] FDA and  has been authorized for detection and/or diagnosis of SARS-CoV-2 by FDA under an Emergency Use Authorization (EUA).  This EUA will remain in effect (meaning this test can be used) for the duration of  the COVID-19 declaration under Section 564(b)(1) of the A ct, 21 U.S.C. section 360bbb-3(b)(1), unless the authorization is terminated or revoked sooner.     Influenza A by PCR NEGATIVE NEGATIVE   Influenza B by PCR NEGATIVE NEGATIVE    Comment: (NOTE) The Xpert Xpress SARS-CoV-2/FLU/RSV plus assay is intended as an aid in the diagnosis of influenza from Nasopharyngeal swab specimens and should not be used as a sole basis for treatment. Nasal washings and aspirates are unacceptable for Xpert Xpress SARS-CoV-2/FLU/RSV testing.  Fact Sheet for Patients: BloggerCourse.com  Fact Sheet for Healthcare Providers: SeriousBroker.it  This test is not yet approved or cleared by the [place] FDA and has been authorized for detection and/or diagnosis of SARS-CoV-2 by FDA under an Emergency Use Authorization (EUA). This EUA will remain in effect (meaning this test can be used) for the duration of the COVID-19 declaration under Section 564(b)(1) of the Act, 21 U.S.C. section 360bbb-3(b)(1), unless the authorization is terminated or revoked.     Resp Syncytial Virus by PCR NEGATIVE NEGATIVE    Comment: (NOTE) Fact Sheet for  Patients: BloggerCourse.com  Fact Sheet for Healthcare Providers: SeriousBroker.it  This test is not yet approved or cleared by the [place] FDA and has been authorized for detection and/or diagnosis of SARS-CoV-2 by FDA under an Emergency Use Authorization (EUA). This EUA will remain in effect (meaning this test can be used) for the duration of the COVID-19 declaration under Section 564(b)(1) of the Act, 21 U.S.C. section 360bbb-3(b)(1), unless the authorization is terminated or revoked.  Performed at [facility], [address], [place], [place] [address]   CBC with Differential     Status: Abnormal   Collection Time: 08/25/22  9:19 PM  Result Value Ref Range   WBC 16.2 (H) 4.0 - 10.5 K/uL   RBC 4.07 (L) 4.22 - 5.81 MIL/uL   Hemoglobin 13.9 13.0 - 17.0 g/dL   HCT 03.5 (L) 00.9 - 38.1 %   MCV 94.3 80.0 - 100.0 fL   MCH 34.2 (H) 26.0 - 34.0 pg   MCHC 36.2 (H) 30.0 - 36.0 g/dL   RDW 82.9 93.7 - 16.9 %   Platelets 186 150 - 400 K/uL   nRBC 0.0 0.0 - 0.2 %   Neutrophils Relative % 97 %   Neutro Abs 15.7 (H) 1.7 - 7.7 K/uL   Lymphocytes Relative 2 %   Lymphs Abs 0.3 (L) 0.7 - 4.0 K/uL   Monocytes Relative 1 %  Monocytes Absolute 0.2 0.1 - 1.0 K/uL   Eosinophils Relative 0 %   Eosinophils Absolute 0.0 0.0 - 0.5 K/uL   Basophils Relative 0 %   Basophils Absolute 0.0 0.0 - 0.1 K/uL   WBC Morphology See Note     Comment: Toxic Granulation   nRBC 0 0 /100 WBC   Abs Immature Granulocytes 0.00 0.00 - 0.07 K/uL    Comment: Performed at [facility] [address], [place], [place] [address]  Comprehensive metabolic panel     Status: Abnormal   Collection Time: 08/25/22  9:19 PM  Result Value Ref Range   Sodium 126 (L) 135 - 145 mmol/L   Potassium 3.6 3.5 - 5.1 mmol/L   Chloride 92 (L) 98 - 111 mmol/L   CO2 23 22 - 32 mmol/L   Glucose, Bld 222 (H) 70 - 99 mg/dL    Comment: Glucose reference range applies only  to samples taken after fasting for at least 8 hours.   BUN 22 8 - 23 mg/dL   Creatinine, Ser 0.83 0.61 - 1.24 mg/dL   Calcium 8.4 (L) 8.9 - 10.3 mg/dL   Total Protein 6.9 6.5 - 8.1 g/dL   Albumin 2.0 (L) 3.5 - 5.0 g/dL   AST 49 (H) 15 - 41 U/L   ALT 27 0 - 44 U/L   Alkaline Phosphatase 159 (H) 38 - 126 U/L   Total Bilirubin 3.1 (H) 0.3 - 1.2 mg/dL   GFR, Estimated >60 >60 mL/min    Comment: (NOTE) Calculated using the CKD-EPI Creatinine Equation (2021)    Anion gap 11 5 - 15    Comment: Performed at [facility], [place] [address], [place], [place] [address]  Lipase, blood     Status: None   Collection Time: 08/25/22  9:19 PM  Result Value Ref Range   Lipase 28 11 - 51 U/L    Comment: Performed at [facility], [place] [address] [place], [place] [address]  Urinalysis, Routine w reflex microscopic -Urine, Clean Catch     Status: Abnormal   Collection Time: 08/25/22  9:23 PM  Result Value Ref Range   Color, Urine AMBER (A) YELLOW    Comment: BIOCHEMICALS MAY BE AFFECTED BY COLOR   APPearance CLEAR CLEAR   Specific Gravity, Urine 1.021 1.005 - 1.030   pH 6.0 5.0 - 8.0   Glucose, UA NEGATIVE NEGATIVE mg/dL   Hgb urine dipstick NEGATIVE NEGATIVE   Bilirubin Urine NEGATIVE NEGATIVE   Ketones, ur NEGATIVE NEGATIVE mg/dL   Protein, ur NEGATIVE NEGATIVE mg/dL   Nitrite NEGATIVE NEGATIVE   Leukocytes,Ua NEGATIVE NEGATIVE    Comment: Performed at [facility] [address] [place], [place] [address]  Lactic acid, plasma     Status: Abnormal   Collection Time: 08/26/22  1:14 AM  Result Value Ref Range   Lactic Acid, Venous 3.1 (HH) 0.5 - 1.9 mmol/L    Comment: CRITICAL RESULT CALLED TO, READ BACK BY AND VERIFIED WITH A. COBB, RN AT 0157 ON 08/26/22 BY H. HOWARD. Performed at [facility], [place] [address] [place], [place] [address]   Lactic acid, plasma     Status: Abnormal   Collection Time: 08/26/22  2:53 AM  Result Value Ref Range   Lactic Acid, Venous 3.5  (HH) 0.5 - 1.9 mmol/L    Comment: CRITICAL VALUE NOTED. VALUE IS CONSISTENT WITH PREVIOUSLY REPORTED/CALLED VALUE Performed at [facility], [place]  [address] [place], [place] [address]   CBC     Status: Abnormal   Collection Time: 08/26/22  3:29 AM  Result Value Ref Range   WBC 12.5 (H) 4.0 - 10.5 K/uL   RBC 3.59 (L) 4.22 - 5.81 MIL/uL   Hemoglobin 11.7 (L) 13.0 - 17.0 g/dL   HCT 35.0 (L) 39.0 - 52.0 %   MCV 97.5 80.0 - 100.0 fL   MCH 32.6 26.0 - 34.0 pg   MCHC 33.4 30.0 - 36.0 g/dL   RDW 13.5 11.5 - 15.5 %   Platelets 164 150 - 400 K/uL   nRBC 0.0 0.0 - 0.2 %    Comment: Performed at [facility], [place] [address], [place], [place] [address]  Creatinine, serum     Status: None   Collection Time: 08/26/22  3:29 AM  Result Value Ref Range   Creatinine, Ser 0.68 0.61 - 1.24 mg/dL   GFR, Estimated >60 >60 mL/min    Comment: (NOTE) Calculated using the CKD-EPI Creatinine Equation (2021) Performed at [facility] [address], [place], [place] [address]   Brain natriuretic peptide     Status: Abnormal   Collection Time: 08/26/22  3:29 AM  Result Value Ref Range   B Natriuretic Peptide 134.0 (H) 0.0 - 100.0 pg/mL    Comment: Performed at [facility] [address] [place], [place] [address]  Hemoglobin A1c     Status: Abnormal   Collection Time: 08/26/22  3:29 AM  Result Value Ref Range   Hgb A1c MFr Bld 6.5 (H) 4.8 - 5.6 %    Comment: (NOTE) Pre diabetes:          5.7%-6.4%  Diabetes:              >6.4%  Glycemic control for   <7.0% adults with diabetes    Mean Plasma Glucose 139.85 mg/dL    Comment: Performed at [facility] [address] [place], [place] [address]  Strep pneumoniae urinary antigen     Status: Abnormal   Collection Time: 08/26/22  3:52 AM  Result Value Ref Range   Strep Pneumo Urinary Antigen POSITIVE (A) NEGATIVE    Comment: REULTS CALLED TO,READ BACK BY AND VERIFIED WITH RN J.SERRAINOLS ON 08/26/22 AT 0612 BY  NM Performed at [facility], [place] [address] [place], [place] [id number]   Basic metabolic panel     Status: Abnormal   Collection Time: 08/26/22  6:07 AM  Result Value Ref Range   Sodium 128 (L) 135 - 145 mmol/L   Potassium 3.5 3.5 - 5.1 mmol/L   Chloride 96 (L) 98 - 111 mmol/L   CO2 19 (L) 22 - 32 mmol/L   Glucose, Bld 182 (H) 70 - 99 mg/dL    Comment: Glucose reference range applies only to samples taken after fasting for at least 8 hours.   BUN 21 8 - 23 mg/dL   Creatinine, Ser 0.71 0.61 - 1.24 mg/dL   Calcium 7.7 (L) 8.9 - 10.3 mg/dL   GFR, Estimated >60 >60 mL/min    Comment: (NOTE) Calculated using the CKD-EPI Creatinine Equation (2021)    Anion gap 13 5 - 15    Comment: Performed at [facility] [address], [place], [place] [address]  Magnesium     Status: None   Collection Time: 08/26/22  6:07 AM  Result Value Ref Range   Magnesium 1.9 1.7 - 2.4 mg/dL  Comment: Performed at [facility], [place] [address], [place], [place] [address]  Phosphorus     Status: None   Collection Time: 08/26/22  6:07 AM  Result Value Ref Range   Phosphorus 3.9 2.5 - 4.6 mg/dL    Comment: Performed at [facility] [address] [place], [place] [address]  CBG monitoring, ED     Status: Abnormal   Collection Time: 08/26/22  7:51 AM  Result Value Ref Range   Glucose-Capillary 177 (H) 70 - 99 mg/dL    Comment: Glucose reference range applies only to samples taken after fasting for at least 8 hours.  CBG monitoring, ED     Status: Abnormal   Collection Time: 08/26/22 12:13 PM  Result Value Ref Range   Glucose-Capillary 250 (H) 70 - 99 mg/dL    Comment: Glucose reference range applies only to samples taken after fasting for at least 8 hours.  CBG monitoring, ED     Status: Abnormal   Collection Time: 08/26/22  5:29 PM  Result Value Ref Range   Glucose-Capillary 180 (H) 70 - 99 mg/dL    Comment: Glucose reference range applies only to samples taken after fasting  for at least 8 hours.   DG Chest 2 View  Result Date: 08/26/2022 CLINICAL DATA:  cough EXAM: CHEST - 2 VIEW COMPARISON:  CT chest 10/10/2020 FINDINGS: The heart and mediastinal contours are within normal limits. Almost complete opacification of the right upper lobe. No pulmonary edema. No pleural effusion. No pneumothorax. No acute osseous abnormality. IMPRESSION: Almost complete opacification of the right upper lobe. Followup PA and lateral chest X-ray is recommended in 3-4 weeks following therapy to ensure resolution and exclude underlying malignancy. Electronically Signed   By: Iven Finn M.D.   On: 08/26/2022 00:27    Pending Labs Unresulted Labs (From admission, onward)     Start     Ordered   09/02/22 0500  Creatinine, serum  (enoxaparin (LOVENOX)    CrCl >/= 30 ml/min)  Weekly,   R     Comments: while on enoxaparin therapy    08/26/22 0316   08/27/22 0500  CBC  Tomorrow morning,   R        08/26/22 1147   08/27/22 4132  Basic metabolic panel  Tomorrow morning,   R        08/26/22 1147   08/27/22 0500  Lactic acid, plasma  Tomorrow morning,   R        08/26/22 1449   08/26/22 0352  Legionella Pneumophila Serogp 1 Ur Ag  Once,   R        08/26/22 0351   08/26/22 0351  QuantiFERON-TB Gold Plus  Once,   R        08/26/22 0350            Vitals/Pain Today's Vitals   08/26/22 1600 08/26/22 1700 08/26/22 1730 08/26/22 1800  BP: (!) 140/83 (!) 101/57  98/61  Pulse:  71  93  Resp: 18 (!) 27  (!) 22  Temp:   98.1 F (36.7 C)   TempSrc:   Oral   SpO2: 98% 95%  97%  Weight:      Height:      PainSc:        Isolation Precautions Airborne and Contact precautions  Medications Medications  enoxaparin (LOVENOX) injection 40 mg (40 mg Subcutaneous Given 08/26/22 0922)  cefTRIAXone (ROCEPHIN) 2 g in sodium chloride 0.9 % 100  mL IVPB (0 g Intravenous Stopped 08/26/22 0957)  doxycycline (VIBRA-TABS) tablet 100 mg (100 mg Oral Given 08/26/22 0922)  albuterol (VENTOLIN HFA)  108 (90 Base) MCG/ACT inhaler 2 puff (has no administration in time range)  remdesivir 200 mg in sodium chloride 0.9% 250 mL IVPB (0 mg Intravenous Stopped 08/26/22 0721)    Followed by  remdesivir 100 mg in sodium chloride 0.9 % 100 mL IVPB (has no administration in time range)  guaiFENesin-dextromethorphan (ROBITUSSIN DM) 100-10 MG/5ML syrup 10 mL (10 mLs Oral Given 08/26/22 0807)  ascorbic acid (VITAMIN C) tablet 500 mg (500 mg Oral Given 08/26/22 [id number])  zinc sulfate capsule 220 mg (220 mg Oral Given 08/26/22 [id number])  multivitamin with minerals tablet 1 tablet (1 tablet Oral Given 08/26/22 0922)  insulin aspart (novoLOG) injection 0-15 Units (3 Units Subcutaneous Given 08/26/22 1748)  insulin aspart (novoLOG) injection 0-5 Units (has no administration in time range)  sodium chloride 0.9 % bolus 1,000 mL (0 mLs Intravenous Stopped 08/26/22 0319)  cefTRIAXone (ROCEPHIN) 1 g in sodium chloride 0.9 % 100 mL IVPB (0 g Intravenous Stopped 08/26/22 0200)  doxycycline (VIBRA-TABS) tablet 100 mg (100 mg Oral Given 08/26/22 0108)    Mobility power wheelchair     Focused Assessments Pulmonary Assessment Handoff:  Lung sounds: Bilateral Breath Sounds: Diminished O2 Device: Room Air      R Recommendations: See Admitting Provider Note  Report given to:   Additional Notes: covid+, awaiting TB quantiferon results (sent at 0800 08/26/22 and they have a 24-48hr turn around time d/t being a send out lab)

## 2022-08-26 NOTE — ED Notes (Signed)
Pt with critical lactic acid of 3.1 per lab. MD notified

## 2022-08-26 NOTE — ED Provider Notes (Signed)
[facility] Provider Note   CSN: [id number] Arrival date & time: 08/25/22  2037     History  Chief Complaint  Patient presents with   Cough / Nausea    Covid+    Cory Palmer is a 62 y.o. male.  62 y/o male, presently homeless and living in a shelter, with hx of DM, PAF (not on chronic anticoagulation), and HTN presents for cough, fatigue, and SOB. Patient unable to specify how long these symptoms have been present. He does complain of ongoing nausea and vomiting. States this has been intermittent and waxing/waning x 5 months. Has had difficulty tolerating PO secondary to ongoing vomiting. He reports that many individuals have been sick in the shelter. No known fevers. Consumes 1-2 pints of wine per week.  The history is provided by the patient. No language interpreter was used.       Home Medications Prior to Admission medications   Medication Sig Start Date End Date Taking? Authorizing Provider  amLODipine (NORVASC) 5 MG tablet Take 1 tablet (5 mg total) by mouth daily. Patient not taking: Reported on 06/26/2022 05/04/22   Elsie Stain, MD  doxycycline (VIBRAMYCIN) 100 MG capsule Take 1 capsule (100 mg total) by mouth 2 (two) times daily. 07/08/22   Montine Circle, PA-C  hydrOXYzine (VISTARIL) 25 MG capsule Take 1 capsule (25 mg total) by mouth at bedtime. Patient not taking: Reported on 06/26/2022 05/16/22   Ladell Pier, MD  loperamide (IMODIUM A-D) 2 MG capsule Take 1 capsule (2 mg total) by mouth every 8 (eight) hours as needed for diarrhea or loose stools. Patient not taking: Reported on 06/26/2022 05/16/22   Ladell Pier, MD  metFORMIN (GLUCOPHAGE) 1000 MG tablet Take 1 tablet (1,000 mg total) by mouth 2 (two) times daily with a meal. Patient not taking: Reported on 06/26/2022 03/29/22 06/04/22  Little Ishikawa, MD  metoprolol succinate (TOPROL-XL) 25 MG 24 hr tablet Take 0.5 tablets (12.5 mg total) by  mouth daily. Patient not taking: Reported on 06/26/2022 05/16/22   Ladell Pier, MD  pravastatin (PRAVACHOL) 40 MG tablet Take 1 tablet (40 mg total) by mouth daily. Patient not taking: Reported on 06/26/2022 05/04/22   Elsie Stain, MD  QUEtiapine (SEROQUEL) 50 MG tablet Take 1 tablet (50 mg total) by mouth at bedtime for 15 days. 07/08/22 09/06/22  Montine Circle, PA-C  sertraline (ZOLOFT) 50 MG tablet Take 1 tablet (50 mg total) by mouth daily. Patient not taking: Reported on 06/26/2022 05/04/22   Elsie Stain, MD  traZODone (DESYREL) 50 MG tablet Take 1 tablet (50 mg total) by mouth at bedtime as needed for up to 15 days for sleep. 07/08/22 09/06/22  Montine Circle, PA-C      Allergies    Atorvastatin calcium, Gemfibrozil, and Insulin glargine    Review of Systems   Review of Systems Ten systems reviewed and are negative for acute change, except as noted in the HPI.    Physical Exam Updated Vital Signs BP 105/69   Pulse 88   Temp 98 F (36.7 C) (Oral)   Resp (!) 27   SpO2 97%   Physical Exam Vitals and nursing note reviewed.  Constitutional:      General: He is not in acute distress.    Appearance: He is well-developed. He is not diaphoretic.     Comments: Ill appearing, but nontoxic.   HENT:     Head: Normocephalic and atraumatic.  Eyes:     General: No scleral icterus.    Conjunctiva/sclera: Conjunctivae normal.  Cardiovascular:     Rate and Rhythm: Normal rate. Rhythm irregular.     Pulses: Normal pulses.  Pulmonary:     Effort: Pulmonary effort is normal. No respiratory distress.     Breath sounds: No stridor. No wheezing.     Comments: No overt wheezing or rales. Tachypnea present, mild dyspnea. Chest expansion symmetric. Abdominal:     Palpations: Abdomen is soft.     Tenderness: There is no guarding.     Comments: Soft, distended and obese. No focal TTP.  Musculoskeletal:        General: Normal range of motion.     Cervical back: Normal range  of motion.     Comments: BLE amputation  Skin:    General: Skin is warm and dry.     Coloration: Skin is not pale.     Findings: No erythema or rash.  Neurological:     Mental Status: He is alert and oriented to person, place, and time.     Coordination: Coordination normal.  Psychiatric:        Behavior: Behavior normal.     ED Results / Procedures / Treatments   Labs (all labs ordered are listed, but only abnormal results are displayed) Labs Reviewed  RESP PANEL BY RT-PCR (RSV, FLU A&B, COVID)  RVPGX2 - Abnormal; Notable for the following components:      Result Value   SARS Coronavirus 2 by RT PCR POSITIVE (*)    All other components within normal limits  CBC WITH DIFFERENTIAL/PLATELET - Abnormal; Notable for the following components:   WBC 16.2 (*)    RBC 4.07 (*)    HCT 38.4 (*)    MCH 34.2 (*)    MCHC 36.2 (*)    Neutro Abs 15.7 (*)    Lymphs Abs 0.3 (*)    All other components within normal limits  COMPREHENSIVE METABOLIC PANEL - Abnormal; Notable for the following components:   Sodium 126 (*)    Chloride 92 (*)    Glucose, Bld 222 (*)    Calcium 8.4 (*)    Albumin 2.0 (*)    AST 49 (*)    Alkaline Phosphatase 159 (*)    Total Bilirubin 3.1 (*)    All other components within normal limits  URINALYSIS, ROUTINE W REFLEX MICROSCOPIC - Abnormal; Notable for the following components:   Color, Urine AMBER (*)    All other components within normal limits  LACTIC ACID, PLASMA - Abnormal; Notable for the following components:   Lactic Acid, Venous 3.1 (*)    All other components within normal limits  LIPASE, BLOOD  LACTIC ACID, PLASMA  CBC  CREATININE, SERUM    EKG None  Radiology DG Chest 2 View  Result Date: 08/26/2022 CLINICAL DATA:  cough EXAM: CHEST - 2 VIEW COMPARISON:  CT chest 10/10/2020 FINDINGS: The heart and mediastinal contours are within normal limits. Almost complete opacification of the right upper lobe. No pulmonary edema. No pleural effusion.  No pneumothorax. No acute osseous abnormality. IMPRESSION: Almost complete opacification of the right upper lobe. Followup PA and lateral chest X-ray is recommended in 3-4 weeks following therapy to ensure resolution and exclude underlying malignancy. Electronically Signed   By: Tish Frederickson M.D.   On: 08/26/2022 00:27    Procedures Procedures    Medications Ordered in ED Medications  enoxaparin (LOVENOX) injection 40 mg (has no administration in  time range)  cefTRIAXone (ROCEPHIN) 2 g in sodium chloride 0.9 % 100 mL IVPB (has no administration in time range)  doxycycline (VIBRA-TABS) tablet 100 mg (has no administration in time range)  albuterol (VENTOLIN HFA) 108 (90 Base) MCG/ACT inhaler 2 puff (has no administration in time range)  remdesivir 200 mg in sodium chloride 0.9% 250 mL IVPB (has no administration in time range)    Followed by  remdesivir 100 mg in sodium chloride 0.9 % 100 mL IVPB (has no administration in time range)  guaiFENesin-dextromethorphan (ROBITUSSIN DM) 100-10 MG/5ML syrup 10 mL (has no administration in time range)  ascorbic acid (VITAMIN C) tablet 500 mg (has no administration in time range)  zinc sulfate capsule 220 mg (has no administration in time range)  multivitamin with minerals tablet 1 tablet (has no administration in time range)  sodium chloride 0.9 % bolus 1,000 mL (0 mLs Intravenous Stopped 08/26/22 0319)  cefTRIAXone (ROCEPHIN) 1 g in sodium chloride 0.9 % 100 mL IVPB (0 g Intravenous Stopped 08/26/22 0200)  doxycycline (VIBRA-TABS) tablet 100 mg (100 mg Oral Given 08/26/22 0108)    ED Course/ Medical Decision Making/ A&P Clinical Course as of 08/26/22 0343  Fri Aug 26, 2022  0111 EKG and monitor c/w rate controlled Afib. Hx of PAF. Not on chronic anticoagulation. [KH]  0127 Case discussed with Dr. Nevada Crane of [facility] who will admit. [KH]    Clinical Course User Index [KH] Antonietta Breach, PA-C                             Medical Decision Making Amount  and/or Complexity of Data Reviewed Labs: ordered. Radiology: ordered.  Risk Prescription drug management. Decision regarding hospitalization.   This patient presents to the ED for concern of cough, fatigue and SOB, this involves an extensive number of treatment options, and is a complaint that carries with it a high risk of complications and morbidity.  The differential diagnosis includes viral illness vs PNA vs cancer vs CHF exacerbation vs COPD exacerbation. Patient also with concern for N/V x 5 months, this involves an extensive number of treatment options, and is a complaint that carries with it a high risk of complications and morbidity.  The differential diagnosis includes viral illness vs pSBO vs SBO vs PUD/GERD vs alcoholic gastritis vs diabetic gastroparesis   Co morbidities that complicate the patient evaluation  DM HTN PAF Depression    Additional history obtained:  Additional history obtained from EMS External records from outside source obtained and reviewed including prior CXR from 06/2022 which was clear   Lab Tests:  I Ordered, and personally interpreted labs.  The pertinent results include:  Leukocytosis of 16.2 w/left shift, hyponatremia of 126, albumin 2.0, AST 49, Alk Phos 159, Tbili 3.1. COVID positive.   Imaging Studies ordered:  I ordered imaging studies including CXR  I independently visualized and interpreted imaging which showed opacification of the RUL I agree with the radiologist interpretation   Cardiac Monitoring:  The patient was maintained on a cardiac monitor.  I personally viewed and interpreted the cardiac monitored which showed an underlying rhythm of: rate controlled Afib   Medicines ordered and prescription drug management:  I ordered medication including Rocephin and doxycycline for CAP and albuterol MDI for SOB  Reevaluation of the patient after these medicines showed that the patient stayed the same I have reviewed the patients  home medicines and have made adjustments as needed  Test Considered:  VBG   Critical Interventions:  Initiation of IV abx for pneumonia   Consultations Obtained:  I requested consultation with the hospitalist service and discussed lab and imaging findings as well as pertinent plan - they agree with plan for admission.   Problem List / ED Course:  As above   Reevaluation:  After the interventions noted above, I reevaluated the patient and found that they have :stayed the same   Social Determinants of Health:  Homeless    Dispostion:  After consideration of the diagnostic results and the patients response to treatment, I feel that the patent would benefit from admission for ongoing abx for CAP given SIRS criteria, lack of resources for outpatient management given homelessness status. Currently no respiratory distress, hypoxemia. Moving air well on exam. Will also need correction of hyponatremia. This is favored secondary to dehydration given reported N/V and anorexia. IVF initiated. Dr. Nevada Crane of [facility] to admit.         Final Clinical Impression(s) / ED Diagnoses Final diagnoses:  Community acquired pneumonia of right upper lobe of lung  COVID-19 virus infection  Hyponatremia    Rx / DC Orders ED Discharge Orders     None         Antonietta Breach, PA-C 08/26/22 0353    Fatima Blank, MD 08/26/22 [phone]

## 2022-08-26 NOTE — ED Notes (Signed)
Lab consulted regarding QuantiFERON-TB testing. RN informed blood collection needs to take place ~8am-9am and notify lab after collected. Will need to be sent timely.

## 2022-08-26 NOTE — Progress Notes (Signed)
Patient admitted after midnight, please see H&P.  Here with ? COVID infection: cycle threshold count value is 38.7 and positive strep pna urinary antigen-- continue abx   Cory Bear DO

## 2022-08-26 NOTE — H&P (Addendum)
History and Physical  Cory Palmer FIE:[id number] DOB: [birth year] DOA: 08/25/2022  Referring physician: Dr. Deborah Chalk, [place]  PCP: Pcp, No  Outpatient Specialists: None Patient coming from: Homeless  Chief Complaint: Cough, fatigue.   HPI: Cory Palmer is a 62 y.o. male with medical history significant for type 2 diabetes, paroxysmal A-fib not on oral anticoagulation, hypertension, who presented with complaints of a cough fatigue and shortness of breath.  Endorses a chronic cough for months.  States he has lost weight and he has coughed up blood but none recently.  No trips outside the country.  Denies prior incarceration.  He is unsure whether or not he has had exposure to TB.    In the ED, chest x-ray showing almost complete opacification of the right upper lobe, personally reviewed.  Follow up PA and lateral chest x-ray risk recommended in 3 to 4 weeks following therapy to ensure resolution and exclude underlying malignancy.  COVID-19 screening test by PCR positive on 08/25/2022.  Not hypoxic.  Started on remdesivir for mild to moderate COVID-19 viral infection.  Also started on empiric IV antibiotics for concern for superimposed bacterial pulmonary infection.  TRH, hospitalist service was asked to admit.  ED Course: Tmax 98.  BP 92/61, pulse 87, respiratory 27, O2 saturation 97% on room air.  Lab studies remarkable for serum sodium 126, glucose 222, alkaline phosphatase 159, albumin 2.0, AST 49, bilirubin 3.1.  Review of Systems: Review of systems as noted in the HPI. All other systems reviewed and are negative.   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    Past Surgical History:  Procedure Laterality Date   AMPUTATION Left 09/28/2018   Procedure: LEFT TRANSMETATARSAL AMPUTATION;  Surgeon: Newt Minion, MD;  Location: [facility];  Service: Orthopedics;  Laterality: Left;   AMPUTATION Left 10/23/2019   Procedure: AMPUTATION BELOW KNEE;  Surgeon: Newt Minion, MD;  Location: [facility];  Service: Orthopedics;  Laterality: Left;   BELOW KNEE LEG AMPUTATION Right    ESOPHAGOGASTRODUODENOSCOPY (EGD) WITH PROPOFOL N/A 10/19/2020   Procedure: ESOPHAGOGASTRODUODENOSCOPY (EGD) WITH PROPOFOL;  Surgeon: Juanita Craver, MD;  Location: [facility] ENDOSCOPY;  Service: Endoscopy;  Laterality: N/A;   TEE WITHOUT CARDIOVERSION N/A 10/25/2019   Procedure: TRANSESOPHAGEAL ECHOCARDIOGRAM (TEE);  Surgeon: Lelon Perla, MD;  Location: [facility] ENDOSCOPY;  Service: Cardiovascular;  Laterality: N/A;    Social History:  reports that he has been smoking cigarettes. He has been smoking an average of .25 packs per day. He has never used smokeless tobacco. He reports that he does not currently use alcohol. He reports that he does not currently use drugs after having used the following drugs: Cocaine.   Allergies  Allergen Reactions   Atorvastatin Calcium Nausea And Vomiting   Gemfibrozil    Insulin Glargine     Family History  Problem Relation Age of Onset   Hypertension Other    Diabetes Mellitus II Other       Prior to Admission medications   Medication Sig Start Date End Date Taking? Authorizing Provider  amLODipine (NORVASC) 5 MG tablet Take 1 tablet (5 mg total) by mouth  daily. Patient not taking: Reported on 06/26/2022 05/04/22   Elsie Stain, MD  doxycycline (VIBRAMYCIN) 100 MG capsule Take 1 capsule (100 mg total) by mouth 2 (two) times daily. 07/08/22   Montine Circle, PA-C  hydrOXYzine (VISTARIL) 25 MG capsule Take 1 capsule (25 mg total) by mouth at bedtime. Patient not taking: Reported on 06/26/2022 05/16/22   Ladell Pier, MD  loperamide (IMODIUM A-D) 2  MG capsule Take 1 capsule (2 mg total) by mouth every 8 (eight) hours as needed for diarrhea or loose stools. Patient not taking: Reported on 06/26/2022 05/16/22   Ladell Pier, MD  metFORMIN (GLUCOPHAGE) 1000 MG tablet Take 1 tablet (1,000 mg total) by mouth 2 (two) times daily with a meal. Patient not taking: Reported on 06/26/2022 03/29/22 06/04/22  Little Ishikawa, MD  metoprolol succinate (TOPROL-XL) 25 MG 24 hr tablet Take 0.5 tablets (12.5 mg total) by mouth daily. Patient not taking: Reported on 06/26/2022 05/16/22   Ladell Pier, MD  pravastatin (PRAVACHOL) 40 MG tablet Take 1 tablet (40 mg total) by mouth daily. Patient not taking: Reported on 06/26/2022 05/04/22   Elsie Stain, MD  QUEtiapine (SEROQUEL) 50 MG tablet Take 1 tablet (50 mg total) by mouth at bedtime for 15 days. 07/08/22 09/06/22  Montine Circle, PA-C  sertraline (ZOLOFT) 50 MG tablet Take 1 tablet (50 mg total) by mouth daily. Patient not taking: Reported on 06/26/2022 05/04/22   Elsie Stain, MD  traZODone (DESYREL) 50 MG tablet Take 1 tablet (50 mg total) by mouth at bedtime as needed for up to 15 days for sleep. 07/08/22 09/06/22  Montine Circle, PA-C    Physical Exam: BP 105/69   Pulse 88   Temp 98 F (36.7 C) (Oral)   Resp (!) 27   SpO2 97%   General: 62 y.o. year-old male well developed well nourished in no acute distress.  Alert and oriented x3.  Persistent cough. Cardiovascular: Regular rate and rhythm with no rubs or gallops.  No thyromegaly or JVD noted.  No lower extremity edema. 2/4 pulses in all 4 extremities. Respiratory: Rales noted at bases.  No wheezing noted.  Poor inspiratory effort. Abdomen: Soft nontender nondistended with normal bowel sounds x4 quadrants. Muskuloskeletal: No cyanosis, clubbing or edema noted bilaterally Neuro: CN II-XII intact, strength, sensation, reflexes Skin: No ulcerative lesions noted or rashes Psychiatry: Judgement and insight appear normal. Mood  is appropriate for condition and setting          Labs on Admission:  Basic Metabolic Panel: Recent Labs  Lab 08/25/22 2119  NA 126*  K 3.6  CL 92*  CO2 23  GLUCOSE 222*  BUN 22  CREATININE 0.83  CALCIUM 8.4*   Liver Function Tests: Recent Labs  Lab 08/25/22 2119  AST 49*  ALT 27  ALKPHOS 159*  BILITOT 3.1*  PROT 6.9  ALBUMIN 2.0*   Recent Labs  Lab 08/25/22 2119  LIPASE 28   No results for input(s): "AMMONIA" in the last 168 hours. CBC: Recent Labs  Lab 08/25/22 2119  WBC 16.2*  NEUTROABS 15.7*  HGB 13.9  HCT 38.4*  MCV 94.3  PLT 186   Cardiac Enzymes: No results for input(s): "CKTOTAL", "CKMB", "CKMBINDEX", "TROPONINI" in the last 168 hours.  BNP (last 3 results) Recent Labs    01/20/22 1751  BNP 58.4    ProBNP (last 3 results) No results for input(s): "PROBNP" in the last 8760 hours.  CBG: No results for input(s): "GLUCAP"  in the last 168 hours.  Radiological Exams on Admission: DG Chest 2 View  Result Date: 08/26/2022 CLINICAL DATA:  cough EXAM: CHEST - 2 VIEW COMPARISON:  CT chest 10/10/2020 FINDINGS: The heart and mediastinal contours are within normal limits. Almost complete opacification of the right upper lobe. No pulmonary edema. No pleural effusion. No pneumothorax. No acute osseous abnormality. IMPRESSION: Almost complete opacification of the right upper lobe. Followup PA and lateral chest X-ray is recommended in 3-4 weeks following therapy to ensure resolution and exclude underlying malignancy. Electronically Signed   By: Tish Frederickson M.D.   On: 08/26/2022 00:27    EKG: I independently viewed the EKG done and my findings are as followed: Atrial fibrillation rate of 81.  Nonspecific ST-T changes.  QTc 496.  Assessment/Plan Present on Admission:  Pneumonia  Principal Problem:   Pneumonia  Right upper lobe pneumonia, seen on chest x-ray, POA Personally reviewed chest x-ray done on admission. Per radiology recommendation chest  x-ray showing almost complete opacification of the right upper lobe, personally reviewed.  Follow up PA and lateral chest x-ray risk recommended in 3 to 4 weeks following therapy to ensure resolution and exclude underlying malignancy. Maintain airborne precautions. Follow urine strep antigen, urine Legionella antigen, QuantiFERON gold TB Continue home losartan and doxycycline.  COVID-19 viral infection, POA Positive COVID-19 screening test on 08/25/2022 3-day course of IV remdesivir As needed bronchodilators and as needed antitussives  Euvolemic hyponatremia Sodium 126, corrected sodium for hyperglycemia 128. Encourage oral intake. Repeat chemistry panel in the morning.  Elevated liver chemistries, unclear etiology Alkaline phosphatase 159, AST 49, T. bili 3.1. Avoid hepatotoxic agents Continue to monitor  Paroxysmal A-fib not on oral anticoagulation Patient not on rate control agents Monitor on telemetry  Prolonged QTc Avoid QTc prolonging agents Optimize magnesium and potassium levels.  Type 2 diabetes with hyperglycemia Obtain hemoglobin A1c Start insulin sliding scale.  Homelessness TOC consulted to assist with placement      Critical care time: 65 minutes.     DVT prophylaxis: Subcu Lovenox daily   Code Status: Full code  Family Communication: None at bedside  Disposition Plan: Admitted to progressive care unit  Consults called: None.  Admission status: Inpatient status.   Status is: Inpatient The patient requires at least 2 midnights for further evaluation and treatment of present condition.   Darlin Drop MD [facility] Pager [phone]  If 7PM-7AM, please contact night-coverage [email or web address] Password [facility]  08/26/2022, 3:18 AM

## 2022-08-27 ENCOUNTER — Inpatient Hospital Stay (HOSPITAL_COMMUNITY): Payer: 59

## 2022-08-27 DIAGNOSIS — J189 Pneumonia, unspecified organism: Secondary | ICD-10-CM | POA: Diagnosis not present

## 2022-08-27 DIAGNOSIS — U071 COVID-19: Secondary | ICD-10-CM | POA: Diagnosis not present

## 2022-08-27 LAB — GLUCOSE, CAPILLARY
Glucose-Capillary: 212 mg/dL — ABNORMAL HIGH (ref 70–99)
Glucose-Capillary: 153 mg/dL — ABNORMAL HIGH (ref 70–99)
Glucose-Capillary: 250 mg/dL — ABNORMAL HIGH (ref 70–99)
Glucose-Capillary: 181 mg/dL — ABNORMAL HIGH (ref 70–99)
Glucose-Capillary: 170 mg/dL — ABNORMAL HIGH (ref 70–99)

## 2022-08-27 LAB — BASIC METABOLIC PANEL
Anion gap: 10 (ref 5–15)
BUN: 14 mg/dL (ref 8–23)
CO2: 20 mmol/L — ABNORMAL LOW (ref 22–32)
Calcium: 7.8 mg/dL — ABNORMAL LOW (ref 8.9–10.3)
Chloride: 96 mmol/L — ABNORMAL LOW (ref 98–111)
Creatinine, Ser: 0.63 mg/dL (ref 0.61–1.24)
GFR, Estimated: >60 mL/min (ref >60)
Glucose, Bld: 246 mg/dL — ABNORMAL HIGH (ref 70–99)
Potassium: 3.8 mmol/L (ref 3.5–5.1)
Sodium: 126 mmol/L — ABNORMAL LOW (ref 135–145)

## 2022-08-27 LAB — CBC
HCT: 30 % — ABNORMAL LOW (ref 39.0–52.0)
Hemoglobin: 10.5 g/dL — ABNORMAL LOW (ref 13.0–17.0)
MCH: 33 pg (ref 26.0–34.0)
MCHC: 35 g/dL (ref 30.0–36.0)
MCV: 94.3 fL (ref 80.0–100.0)
Platelets: 165 10*3/uL (ref 150–400)
RBC: 3.18 MIL/uL — ABNORMAL LOW (ref 4.22–5.81)
RDW: 13.4 % (ref 11.5–15.5)
WBC: 13 10*3/uL — ABNORMAL HIGH (ref 4.0–10.5)
nRBC: 0.2 % (ref 0.0–0.2)

## 2022-08-27 LAB — OSMOLALITY, URINE: Osmolality, Ur: 614 mOsm/kg (ref 300–900)

## 2022-08-27 LAB — LACTIC ACID, PLASMA: Lactic Acid, Venous: 2.8 mmol/L (ref 0.5–1.9)

## 2022-08-27 LAB — SODIUM, URINE, RANDOM: Sodium, Ur: <10 mmol/L

## 2022-08-27 MED ORDER — SODIUM CHLORIDE 0.9 % IV SOLN: INTRAVENOUS | Status: DC

## 2022-08-27 MED ORDER — NYSTATIN 100000 UNIT/GM EX POWD
Freq: Three times a day (TID) | CUTANEOUS | Status: DC
  Filled 2022-08-27: qty 15

## 2022-08-27 MED ORDER — MELATONIN 5 MG PO TABS
5.0000 mg | ORAL_TABLET | Freq: Every evening | ORAL | Status: DC | PRN
  Administered 2022-08-27 – 2022-08-29 (×2): 5 mg via ORAL
  Filled 2022-08-27 (×2): qty 1

## 2022-08-27 NOTE — Progress Notes (Signed)
TR[facility] night cross cover note:   I was notified by RN of the patient's request for a sleep aid. I subsequently placed order for prn melatonin for insomnia.     Ori Trejos, DO Hospitalist  

## 2022-08-27 NOTE — Progress Notes (Signed)
TR[facility] night cross cover note:   I was notified by RN that the patient has an erythematous rash in the groin associated with some pruritus and mild tenderness, potentially representing some MASD.  Will initiate nystatin powder to assist with drying out the area and attending to any component of yeast in this moisture-prone area.     Babs Bertin, DO Hospitalist

## 2022-08-27 NOTE — Progress Notes (Signed)
PROGRESS NOTE    Cory Palmer  OEU:[id number] DOB: 09-25-1960 DOA: 08/25/2022 PCP: Merryl Hacker, No    Brief Narrative:  Cory Palmer is a 62 y.o. male with medical history significant for type 2 diabetes, paroxysmal A-fib not on oral anticoagulation, hypertension, who presented with complaints of a cough fatigue and shortness of breath.  Endorses a chronic cough for months.  States he has lost weight and he has coughed up blood but none recently.  No trips outside the country.  Denies prior incarceration.  He is unsure whether or not he has had exposure to TB.     In the ED, chest x-ray showing almost complete opacification of the right upper lobe, personally reviewed.  Follow up PA and lateral chest x-ray risk recommended in 3 to 4 weeks following therapy to ensure resolution and exclude underlying malignancy.   COVID-19 screening test by PCR positive on 08/25/2022.  Not hypoxic.  Started on remdesivir for mild to moderate COVID-19 viral infection.  Also started on empiric IV antibiotics for concern for superimposed bacterial pulmonary infection.  TRH, hospitalist service was asked to admit.   Assessment and Plan: Right upper lobe pneumonia, seen on chest x-ray, POA due to strep pna Personally reviewed chest x-ray done on admission. Per radiology recommendation chest x-ray showing almost complete opacification of the right upper lobe, personally reviewed.  Follow up PA and lateral chest x-ray risk recommended in 3 to 4 weeks following therapy to ensure resolution and exclude underlying malignancy. -will get CT scan of chest -IV abx   COVID-19 viral infection, POA Positive COVID-19 screening test on 08/25/2022 3-day course of IV remdesivir As needed bronchodilators and as needed antitussives   Euvolemic hyponatremia Sodium 126, corrected sodium for hyperglycemia 128. -check urine na and osmo -get CT scan of chest   Elevated liver chemistries, unclear etiology Alkaline phosphatase 159, AST  49, T. bili 3.1. Avoid hepatotoxic agents Continue to monitor   Paroxysmal A-fib not on oral anticoagulation Patient not on rate control agents or blood thinner Monitor on telemetry   Prolonged QTc Avoid QTc prolonging agents Optimize magnesium and potassium levels.   Type 2 diabetes with hyperglycemia -SSI   Homelessness TOC consulted to assist with placement   Obesity Estimated body mass index is 34.06 kg/m as calculated from the following:   Height as of this encounter: 5\' 8"  (1.727 m).   Weight as of this encounter: 101.6 kg.        DVT prophylaxis: enoxaparin (LOVENOX) injection 40 mg Start: 08/26/22 1000    Code Status: Full Code Family Communication:   Disposition Plan:  Level of care: Progressive Status is: Inpatient Remains inpatient appropriate because: needs IV abx    Consultants:  none   Subjective: + cough  Objective: Vitals:   08/26/22 1800 08/26/22 2335 08/27/22 0405 08/27/22 0803  BP: 98/61 116/71 130/69 102/70  Pulse: 93 82 87   Resp: (!) 22 20 14    Temp:  15.4 F (00.8 C) 98.1 F (36.7 C) 98.5 F (36.9 C)  TempSrc:  Oral Oral Oral  SpO2: 97% 92% 92%   Weight:      Height:        Intake/Output Summary (Last 24 hours) at 08/27/2022 1056 Last data filed at 08/27/2022 0407 Gross per 24 hour  Intake --  Output 700 ml  Net -700 ml   Filed Weights   08/26/22 0801  Weight: 101.6 kg    Examination:   General: Appearance:  Obese male in no acute distress     Lungs:      respirations unlabored  Heart:    Normal heart rate. 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   Neurologic:   Awake, alert, oriented x 3. No apparent focal neurological           defect.        Data Reviewed: I have personally reviewed following labs and imaging studies  CBC: Recent Labs  Lab 08/25/22 2119 08/26/22 0329 08/27/22 0219  WBC 16.2* 12.5* 13.0*   NEUTROABS 15.7*  --   --   HGB 13.9 11.7* 10.5*  HCT 38.4* 35.0* 30.0*  MCV 94.3 97.5 94.3  PLT 186 164 165   Basic Metabolic Panel: Recent Labs  Lab 08/25/22 2119 08/26/22 0329 08/26/22 0607 08/27/22 0219  NA 126*  --  128* 126*  K 3.6  --  3.5 3.8  CL 92*  --  96* 96*  CO2 23  --  19* 20*  GLUCOSE 222*  --  182* 246*  BUN 22  --  21 14  CREATININE 0.83 0.68 0.71 0.63  CALCIUM 8.4*  --  7.7* 7.8*  MG  --   --  1.9  --   PHOS  --   --  3.9  --    GFR: Estimated Creatinine Clearance: 110.6 mL/min (by C-G formula based on SCr of 0.63 mg/dL). Liver Function Tests: Recent Labs  Lab 08/25/22 2119  AST 49*  ALT 27  ALKPHOS 159*  BILITOT 3.1*  PROT 6.9  ALBUMIN 2.0*   Recent Labs  Lab 08/25/22 2119  LIPASE 28   No results for input(s): "AMMONIA" in the last 168 hours. Coagulation Profile: No results for input(s): "INR", "PROTIME" in the last 168 hours. Cardiac Enzymes: No results for input(s): "CKTOTAL", "CKMB", "CKMBINDEX", "TROPONINI" in the last 168 hours. BNP (last 3 results) No results for input(s): "PROBNP" in the last 8760 hours. HbA1C: Recent Labs    08/26/22 0329  HGBA1C 6.5*   CBG: Recent Labs  Lab 08/26/22 1213 08/26/22 1729 08/26/22 2326 08/27/22 0405 08/27/22 0757  GLUCAP 250* 180* 284* 212* 153*   Lipid Profile: No results for input(s): "CHOL", "HDL", "LDLCALC", "TRIG", "CHOLHDL", "LDLDIRECT" in the last 72 hours. Thyroid Function Tests: No results for input(s): "TSH", "T4TOTAL", "FREET4", "T3FREE", "THYROIDAB" in the last 72 hours. Anemia Panel: No results for input(s): "VITAMINB12", "FOLATE", "FERRITIN", "TIBC", "IRON", "RETICCTPCT" in the last 72 hours. Sepsis Labs: Recent Labs  Lab 08/26/22 0114 08/26/22 0253 08/27/22 0219  LATICACIDVEN 3.1* 3.5* 2.8*    Recent Results (from the past 240 hour(s))  Resp panel by RT-PCR (RSV, Flu A&B, Covid) Anterior Nasal Swab     Status: Abnormal   Collection Time: 08/25/22  9:08 PM    Specimen: Anterior Nasal Swab  Result Value Ref Range Status   SARS Coronavirus 2 by RT PCR POSITIVE (A) NEGATIVE Final    Comment: (NOTE) SARS-CoV-2 target nucleic acids are DETECTED.  The SARS-CoV-2 RNA is generally detectable in upper respiratory specimens during the acute phase of infection. Positive results are indicative of the presence of the identified virus, but do not rule out bacterial infection or co-infection with other pathogens not detected by the test. Clinical correlation with patient history and other diagnostic information is necessary to determine patient infection status. The expected result is Negative.  Fact Sheet for  Patients: EntrepreneurPulse.com.au  Fact Sheet for Healthcare Providers: IncredibleEmployment.be  This test is not yet approved or cleared by the [place] FDA and  has been authorized for detection and/or diagnosis of SARS-CoV-2 by FDA under an Emergency Use Authorization (EUA).  This EUA will remain in effect (meaning this test can be used) for the duration of  the COVID-19 declaration under Section 564(b)(1) of the A ct, 21 U.S.C. section 360bbb-3(b)(1), unless the authorization is terminated or revoked sooner.     Influenza A by PCR NEGATIVE NEGATIVE Final   Influenza B by PCR NEGATIVE NEGATIVE Final    Comment: (NOTE) The Xpert Xpress SARS-CoV-2/FLU/RSV plus assay is intended as an aid in the diagnosis of influenza from Nasopharyngeal swab specimens and should not be used as a sole basis for treatment. Nasal washings and aspirates are unacceptable for Xpert Xpress SARS-CoV-2/FLU/RSV testing.  Fact Sheet for Patients: EntrepreneurPulse.com.au  Fact Sheet for Healthcare Providers: IncredibleEmployment.be  This test is not yet approved or cleared by the [place] FDA and has been authorized for detection and/or diagnosis of SARS-CoV-2 by FDA under an  Emergency Use Authorization (EUA). This EUA will remain in effect (meaning this test can be used) for the duration of the COVID-19 declaration under Section 564(b)(1) of the Act, 21 U.S.C. section 360bbb-3(b)(1), unless the authorization is terminated or revoked.     Resp Syncytial Virus by PCR NEGATIVE NEGATIVE Final    Comment: (NOTE) Fact Sheet for Patients: EntrepreneurPulse.com.au  Fact Sheet for Healthcare Providers: IncredibleEmployment.be  This test is not yet approved or cleared by the [place] FDA and has been authorized for detection and/or diagnosis of SARS-CoV-2 by FDA under an Emergency Use Authorization (EUA). This EUA will remain in effect (meaning this test can be used) for the duration of the COVID-19 declaration under Section 564(b)(1) of the Act, 21 U.S.C. section 360bbb-3(b)(1), unless the authorization is terminated or revoked.  Performed at [facility], [place] [address], [place], [place] [address]   MRSA Next Gen by PCR, Nasal     Status: None   Collection Time: 08/26/22  9:43 PM   Specimen: Nasal Mucosa; Nasal Swab  Result Value Ref Range Status   MRSA by PCR Next Gen NOT DETECTED NOT DETECTED Final    Comment: (NOTE) The GeneXpert MRSA Assay (FDA approved for NASAL specimens only), is one component of a comprehensive MRSA colonization surveillance program. It is not intended to diagnose MRSA infection nor to guide or monitor treatment for MRSA infections. Test performance is not FDA approved in patients less than 85 years old. Performed at [facility], [place] [address], [place], [place] [address]          Radiology Studies: DG Chest 2 View  Result Date: 08/26/2022 CLINICAL DATA:  cough EXAM: CHEST - 2 VIEW COMPARISON:  CT chest 10/10/2020 FINDINGS: The heart and mediastinal contours are within normal limits. Almost complete opacification of the right upper lobe. No pulmonary edema. No pleural  effusion. No pneumothorax. No acute osseous abnormality. IMPRESSION: Almost complete opacification of the right upper lobe. Followup PA and lateral chest X-ray is recommended in 3-4 weeks following therapy to ensure resolution and exclude underlying malignancy. Electronically Signed   By: Iven Finn M.D.   On: 08/26/2022 00:27        Scheduled Meds:  vitamin C  500 mg Oral Daily   doxycycline  100 mg Oral Q12H   enoxaparin (LOVENOX) injection  40 mg Subcutaneous Daily  insulin aspart  0-15 Units Subcutaneous TID WC   insulin aspart  0-5 Units Subcutaneous QHS   multivitamin with minerals  1 tablet Oral Daily   nystatin   Topical TID   zinc sulfate  220 mg Oral Daily   Continuous Infusions:  sodium chloride 50 mL/hr at 08/27/22 0913   cefTRIAXone (ROCEPHIN)  IV 2 g (08/27/22 0916)   remdesivir 100 mg in sodium chloride 0.9 % 100 mL IVPB       LOS: 1 day    Time spent: 45 minutes spent on chart review, discussion with nursing staff, consultants, updating family and interview/physical exam; more than 50% of that time was spent in counseling and/or coordination of care.    Geradine Girt, DO [facility] Available via Epic secure chat 7am-7pm After these hours, please refer to coverage provider listed on [email or web address] 08/27/2022, 10:56 AM

## 2022-08-28 DIAGNOSIS — U071 COVID-19: Secondary | ICD-10-CM | POA: Diagnosis not present

## 2022-08-28 DIAGNOSIS — E871 Hypo-osmolality and hyponatremia: Secondary | ICD-10-CM

## 2022-08-28 DIAGNOSIS — J189 Pneumonia, unspecified organism: Secondary | ICD-10-CM | POA: Diagnosis not present

## 2022-08-28 LAB — CBC
HCT: 30.8 % — ABNORMAL LOW (ref 39.0–52.0)
Hemoglobin: 11.1 g/dL — ABNORMAL LOW (ref 13.0–17.0)
MCH: 33.4 pg (ref 26.0–34.0)
MCHC: 36 g/dL (ref 30.0–36.0)
MCV: 92.8 fL (ref 80.0–100.0)
Platelets: 158 10*3/uL (ref 150–400)
RBC: 3.32 MIL/uL — ABNORMAL LOW (ref 4.22–5.81)
RDW: 13.3 % (ref 11.5–15.5)
WBC: 11.8 10*3/uL — ABNORMAL HIGH (ref 4.0–10.5)
nRBC: 0 % (ref 0.0–0.2)

## 2022-08-28 LAB — BASIC METABOLIC PANEL
Anion gap: 7 (ref 5–15)
BUN: 10 mg/dL (ref 8–23)
CO2: 24 mmol/L (ref 22–32)
Calcium: 7.8 mg/dL — ABNORMAL LOW (ref 8.9–10.3)
Chloride: 96 mmol/L — ABNORMAL LOW (ref 98–111)
Creatinine, Ser: 0.61 mg/dL (ref 0.61–1.24)
GFR, Estimated: >60 mL/min (ref >60)
Glucose, Bld: 218 mg/dL — ABNORMAL HIGH (ref 70–99)
Potassium: 3.5 mmol/L (ref 3.5–5.1)
Sodium: 127 mmol/L — ABNORMAL LOW (ref 135–145)

## 2022-08-28 LAB — GLUCOSE, CAPILLARY
Glucose-Capillary: 210 mg/dL — ABNORMAL HIGH (ref 70–99)
Glucose-Capillary: 328 mg/dL — ABNORMAL HIGH (ref 70–99)
Glucose-Capillary: 178 mg/dL — ABNORMAL HIGH (ref 70–99)
Glucose-Capillary: 183 mg/dL — ABNORMAL HIGH (ref 70–99)

## 2022-08-28 MED ORDER — SODIUM CHLORIDE 0.9 % IV SOLN: INTRAVENOUS | Status: DC

## 2022-08-28 MED ORDER — INSULIN GLARGINE-YFGN 100 UNIT/ML ~~LOC~~ SOLN
10.0000 [IU] | Freq: Every day | SUBCUTANEOUS | Status: DC
  Administered 2022-08-28 – 2022-08-30 (×3): 10 [IU] via SUBCUTANEOUS
  Filled 2022-08-28 (×3): qty 0.1

## 2022-08-28 MED ORDER — METFORMIN HCL 500 MG PO TABS
500.0000 mg | ORAL_TABLET | Freq: Every day | ORAL | Status: DC
  Administered 2022-08-29 – 2022-08-30 (×2): 500 mg via ORAL
  Filled 2022-08-28 (×2): qty 1

## 2022-08-28 NOTE — Progress Notes (Signed)
PROGRESS NOTE    Cory Palmer  VFI:[id number] DOB: 1961/01/06 DOA: 08/25/2022 PCP: Merryl Hacker, No    Brief Narrative:  Cory Palmer is a 62 y.o. male with medical history significant for type 2 diabetes, paroxysmal A-fib not on oral anticoagulation, hypertension, who presented with complaints of a cough fatigue and shortness of breath.  Endorses a chronic cough for months.  States he has lost weight and he has coughed up blood but none recently.  No trips outside the country.  Denies prior incarceration.  He is unsure whether or not he has had exposure to TB.     In the ED, chest x-ray showing almost complete opacification of the right upper lobe, personally reviewed.  Follow up PA and lateral chest x-ray risk recommended in 3 to 4 weeks following therapy to ensure resolution and exclude underlying malignancy.   COVID-19 screening test by PCR positive on 08/25/2022.  Not hypoxic.  Started on remdesivir for mild to moderate COVID-19 viral infection.  Also started on empiric IV antibiotics for concern for superimposed bacterial pulmonary infection.  TRH, hospitalist service was asked to admit.   Assessment and Plan: Right upper lobe pneumonia, seen on chest x-ray, POA due to strep pna Personally reviewed chest x-ray done on admission. Per radiology recommendation chest x-ray showing almost complete opacification of the right upper lobe, personally reviewed.  Follow up PA and lateral chest x-ray risk recommended in 3 to 4 weeks following therapy to ensure resolution and exclude underlying malignancy. - CT scan of chest-Large right upper lobe airspace opacity is noted with air bronchograms most consistent with pneumonia. Multiple small cavitating areas are noted within it suggesting cavitating pneumonia. Minimal right pleural effusion is noted. Enlarged mediastinal adenopathy is noted which is most likely reactive or inflammatory in etiology, but follow-up CT scan in 3-4 weeks is recommended to  ensure resolution. -IV abx for strep pan   COVID-19 viral infection, POA Positive COVID-19 screening test on 08/25/2022 3-day course of IV remdesivir As needed bronchodilators and as needed antitussives   Euvolemic hyponatremia Sodium 126, corrected sodium for hyperglycemia 128. -continue IVF and monitor   Elevated liver chemistries, unclear etiology Alkaline phosphatase 159, AST 49, T. bili 3.1. Avoid hepatotoxic agents Continue to monitor   Paroxysmal A-fib not on oral anticoagulation Patient not on rate control agents or blood thinner Monitor on telemetry   Prolonged QTc Avoid QTc prolonging agents Optimize magnesium and potassium levels.   Type 2 diabetes with hyperglycemia -SSI -not on metformin at home   Homelessness TOC consulted to assist with placement   Obesity Estimated body mass index is 34.06 kg/m as calculated from the following:   Height as of this encounter: 5\' 8"  (1.727 m).   Weight as of this encounter: 101.6 kg.        DVT prophylaxis: enoxaparin (LOVENOX) injection 40 mg Start: 08/26/22 1000    Code Status: Full Code   Disposition Plan:  Level of care: Progressive Status is: Inpatient Remains inpatient appropriate because: needs IV abx    Consultants:  none   Subjective: Still very SOB  Objective: Vitals:   08/27/22 2046 08/27/22 2346 08/28/22 0523 08/28/22 0815  BP: (!) 119/48 102/64 93/67 111/63  Pulse: 74 80 72 74  Resp: (!) 23 20 20 19   Temp: 99.5 F (37.5 C) 98.5 F (36.9 C) 98.5 F (36.9 C) 98.4 F (36.9 C)  TempSrc: Oral Oral Oral Oral  SpO2: 90% 93% 91% 95%  Weight:  Height:        Intake/Output Summary (Last 24 hours) at 08/28/2022 1142 Last data filed at 08/28/2022 1017 Gross per 24 hour  Intake 678.67 ml  Output 2400 ml  Net -1721.33 ml   Filed Weights   08/26/22 0801  Weight: 101.6 kg    Examination:   General: Appearance:    Obese male in no acute distress     Lungs:      respirations  unlabored, diminished  Heart:    Normal heart rate. 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   Neurologic:   Awake, alert, oriented x 3. No apparent focal neurological           defect.        Data Reviewed: I have personally reviewed following labs and imaging studies  CBC: Recent Labs  Lab 08/25/22 2119 08/26/22 0329 08/27/22 0219 08/28/22 0354  WBC 16.2* 12.5* 13.0* 11.8*  NEUTROABS 15.7*  --   --   --   HGB 13.9 11.7* 10.5* 11.1*  HCT 38.4* 35.0* 30.0* 30.8*  MCV 94.3 97.5 94.3 92.8  PLT 186 164 165 158   Basic Metabolic Panel: Recent Labs  Lab 08/25/22 2119 08/26/22 0329 08/26/22 0607 08/27/22 0219 08/28/22 0354  NA 126*  --  128* 126* 127*  K 3.6  --  3.5 3.8 3.5  CL 92*  --  96* 96* 96*  CO2 23  --  19* 20* 24  GLUCOSE 222*  --  182* 246* 218*  BUN 22  --  21 14 10   CREATININE 0.83 0.68 0.71 0.63 0.61  CALCIUM 8.4*  --  7.7* 7.8* 7.8*  MG  --   --  1.9  --   --   PHOS  --   --  3.9  --   --    GFR: Estimated Creatinine Clearance: 110.6 mL/min (by C-G formula based on SCr of 0.61 mg/dL). Liver Function Tests: Recent Labs  Lab 08/25/22 2119  AST 49*  ALT 27  ALKPHOS 159*  BILITOT 3.1*  PROT 6.9  ALBUMIN 2.0*   Recent Labs  Lab 08/25/22 2119  LIPASE 28   No results for input(s): "AMMONIA" in the last 168 hours. Coagulation Profile: No results for input(s): "INR", "PROTIME" in the last 168 hours. Cardiac Enzymes: No results for input(s): "CKTOTAL", "CKMB", "CKMBINDEX", "TROPONINI" in the last 168 hours. BNP (last 3 results) No results for input(s): "PROBNP" in the last 8760 hours. HbA1C: Recent Labs    08/26/22 0329  HGBA1C 6.5*   CBG: Recent Labs  Lab 08/27/22 0757 08/27/22 1238 08/27/22 1625 08/27/22 2123 08/28/22 0822  GLUCAP 153* 250* 181* 170* 210*   Lipid Profile: No results for input(s): "CHOL", "HDL", "LDLCALC", "TRIG", "CHOLHDL",  "LDLDIRECT" in the last 72 hours. Thyroid Function Tests: No results for input(s): "TSH", "T4TOTAL", "FREET4", "T3FREE", "THYROIDAB" in the last 72 hours. Anemia Panel: No results for input(s): "VITAMINB12", "FOLATE", "FERRITIN", "TIBC", "IRON", "RETICCTPCT" in the last 72 hours. Sepsis Labs: Recent Labs  Lab 08/26/22 0114 08/26/22 0253 08/27/22 0219  LATICACIDVEN 3.1* 3.5* 2.8*    Recent Results (from the past 240 hour(s))  Resp panel by RT-PCR (RSV, Flu A&B, Covid) Anterior Nasal Swab     Status: Abnormal   Collection Time: 08/25/22  9:08 PM   Specimen: Anterior Nasal Swab  Result Value Ref Range Status   SARS Coronavirus 2  by RT PCR POSITIVE (A) NEGATIVE Final    Comment: (NOTE) SARS-CoV-2 target nucleic acids are DETECTED.  The SARS-CoV-2 RNA is generally detectable in upper respiratory specimens during the acute phase of infection. Positive results are indicative of the presence of the identified virus, but do not rule out bacterial infection or co-infection with other pathogens not detected by the test. Clinical correlation with patient history and other diagnostic information is necessary to determine patient infection status. The expected result is Negative.  Fact Sheet for Patients: EntrepreneurPulse.com.au  Fact Sheet for Healthcare Providers: IncredibleEmployment.be  This test is not yet approved or cleared by the [place] FDA and  has been authorized for detection and/or diagnosis of SARS-CoV-2 by FDA under an Emergency Use Authorization (EUA).  This EUA will remain in effect (meaning this test can be used) for the duration of  the COVID-19 declaration under Section 564(b)(1) of the A ct, 21 U.S.C. section 360bbb-3(b)(1), unless the authorization is terminated or revoked sooner.     Influenza A by PCR NEGATIVE NEGATIVE Final   Influenza B by PCR NEGATIVE NEGATIVE Final    Comment: (NOTE) The Xpert Xpress  SARS-CoV-2/FLU/RSV plus assay is intended as an aid in the diagnosis of influenza from Nasopharyngeal swab specimens and should not be used as a sole basis for treatment. Nasal washings and aspirates are unacceptable for Xpert Xpress SARS-CoV-2/FLU/RSV testing.  Fact Sheet for Patients: EntrepreneurPulse.com.au  Fact Sheet for Healthcare Providers: IncredibleEmployment.be  This test is not yet approved or cleared by the [place] FDA and has been authorized for detection and/or diagnosis of SARS-CoV-2 by FDA under an Emergency Use Authorization (EUA). This EUA will remain in effect (meaning this test can be used) for the duration of the COVID-19 declaration under Section 564(b)(1) of the Act, 21 U.S.C. section 360bbb-3(b)(1), unless the authorization is terminated or revoked.     Resp Syncytial Virus by PCR NEGATIVE NEGATIVE Final    Comment: (NOTE) Fact Sheet for Patients: EntrepreneurPulse.com.au  Fact Sheet for Healthcare Providers: IncredibleEmployment.be  This test is not yet approved or cleared by the [place] FDA and has been authorized for detection and/or diagnosis of SARS-CoV-2 by FDA under an Emergency Use Authorization (EUA). This EUA will remain in effect (meaning this test can be used) for the duration of the COVID-19 declaration under Section 564(b)(1) of the Act, 21 U.S.C. section 360bbb-3(b)(1), unless the authorization is terminated or revoked.  Performed at [facility], [place] [address], [place], [place] [address]   MRSA Next Gen by PCR, Nasal     Status: None   Collection Time: 08/26/22  9:43 PM   Specimen: Nasal Mucosa; Nasal Swab  Result Value Ref Range Status   MRSA by PCR Next Gen NOT DETECTED NOT DETECTED Final    Comment: (NOTE) The GeneXpert MRSA Assay (FDA approved for NASAL specimens only), is one component of a comprehensive MRSA colonization  surveillance program. It is not intended to diagnose MRSA infection nor to guide or monitor treatment for MRSA infections. Test performance is not FDA approved in patients less than 102 years old. Performed at [facility], [place] [address] [place], [place] [address]          Radiology Studies: CT CHEST WO CONTRAST  Result Date: 08/27/2022 CLINICAL DATA:  Pneumonia. EXAM: CT CHEST WITHOUT CONTRAST TECHNIQUE: Multidetector CT imaging of the chest was performed following the standard protocol without IV contrast. RADIATION DOSE REDUCTION: This exam was performed according to the  departmental dose-optimization program which includes automated exposure control, adjustment of the mA and/or kV according to patient size and/or use of iterative reconstruction technique. COMPARISON:  August 26, 2022.  October 10, 2020. FINDINGS: Cardiovascular: No evidence of thoracic aortic aneurysm. Normal cardiac size. No pericardial effusion. Mild coronary calcifications are noted. Mediastinum/Nodes: Small sliding-type hiatal hernia is noted. Thyroid gland is unremarkable. Mildly enlarged right paratracheal and precarinal lymph nodes are noted, most likely reactive or inflammatory in etiology. The largest measures 15 mm. Lungs/Pleura: Minimal right pleural effusion is noted. No pneumothorax is noted. Left lung is clear. Large right upper lobe airspace opacity is noted with air bronchograms most consistent with pneumonia. There appears to be multiple small cavitating areas within this abnormality. Interval development 3 mm nodule noted in right middle lobe best seen on image number 96 of series 8. Upper Abdomen: Continued presence of hepatic cirrhosis. Musculoskeletal: No chest wall mass or suspicious bone lesions identified. IMPRESSION: Large right upper lobe airspace opacity is noted with air bronchograms most consistent with pneumonia. Multiple small cavitating areas are noted within it suggesting cavitating  pneumonia. Minimal right pleural effusion is noted. Enlarged mediastinal adenopathy is noted which is most likely reactive or inflammatory in etiology, but follow-up CT scan in 3-4 weeks is recommended to ensure resolution. Interval development of 3 mm nodule seen in right middle lobe. Although likely benign, if the patient is high-risk, given the morphology and/or location of this nodule a non-contrast chest CT can be considered in 12 months.This recommendation follows the consensus statement: Guidelines for Management of Incidental Pulmonary Nodules Detected on CT Images: From the Fleischner Society 2017; Radiology 2017; 284:228-243. Hepatic cirrhosis. Mild coronary artery calcifications are noted. Small sliding-type hiatal hernia. Electronically Signed   By: Lupita Raider M.D.   On: 08/27/2022 15:46        Scheduled Meds:  vitamin C  500 mg Oral Daily   doxycycline  100 mg Oral Q12H   enoxaparin (LOVENOX) injection  40 mg Subcutaneous Daily   insulin aspart  0-15 Units Subcutaneous TID WC   insulin aspart  0-5 Units Subcutaneous QHS   multivitamin with minerals  1 tablet Oral Daily   nystatin   Topical TID   zinc sulfate  220 mg Oral Daily   Continuous Infusions:  sodium chloride 50 mL/hr at 08/28/22 0509   cefTRIAXone (ROCEPHIN)  IV 2 g (08/28/22 0835)   remdesivir 100 mg in sodium chloride 0.9 % 100 mL IVPB 100 mg (08/27/22 1313)     LOS: 2 days    Time spent: 45 minutes spent on chart review, discussion with nursing staff, consultants, updating family and interview/physical exam; more than 50% of that time was spent in counseling and/or coordination of care.    Joseph Art, DO [facility] Available via Epic secure chat 7am-7pm After these hours, please refer to coverage provider listed on [email or web address] 08/28/2022, 11:42 AM

## 2022-08-29 DIAGNOSIS — J189 Pneumonia, unspecified organism: Secondary | ICD-10-CM | POA: Diagnosis not present

## 2022-08-29 DIAGNOSIS — U071 COVID-19: Secondary | ICD-10-CM | POA: Diagnosis not present

## 2022-08-29 DIAGNOSIS — E871 Hypo-osmolality and hyponatremia: Secondary | ICD-10-CM | POA: Diagnosis not present

## 2022-08-29 LAB — CBC
HCT: 31.6 % — ABNORMAL LOW (ref 39.0–52.0)
Hemoglobin: 11.3 g/dL — ABNORMAL LOW (ref 13.0–17.0)
MCH: 33.7 pg (ref 26.0–34.0)
MCHC: 35.8 g/dL (ref 30.0–36.0)
MCV: 94.3 fL (ref 80.0–100.0)
Platelets: 148 10*3/uL — ABNORMAL LOW (ref 150–400)
RBC: 3.35 MIL/uL — ABNORMAL LOW (ref 4.22–5.81)
RDW: 13.6 % (ref 11.5–15.5)
WBC: 12.1 10*3/uL — ABNORMAL HIGH (ref 4.0–10.5)
nRBC: 0 % (ref 0.0–0.2)

## 2022-08-29 LAB — GLUCOSE, CAPILLARY
Glucose-Capillary: 133 mg/dL — ABNORMAL HIGH (ref 70–99)
Glucose-Capillary: 186 mg/dL — ABNORMAL HIGH (ref 70–99)
Glucose-Capillary: 223 mg/dL — ABNORMAL HIGH (ref 70–99)
Glucose-Capillary: 233 mg/dL — ABNORMAL HIGH (ref 70–99)

## 2022-08-29 LAB — LEGIONELLA PNEUMOPHILA SEROGP 1 UR AG: L. pneumophila Serogp 1 Ur Ag: NEGATIVE

## 2022-08-29 LAB — BASIC METABOLIC PANEL
Anion gap: 8 (ref 5–15)
BUN: 12 mg/dL (ref 8–23)
CO2: 22 mmol/L (ref 22–32)
Calcium: 7.7 mg/dL — ABNORMAL LOW (ref 8.9–10.3)
Chloride: 97 mmol/L — ABNORMAL LOW (ref 98–111)
Creatinine, Ser: 0.52 mg/dL — ABNORMAL LOW (ref 0.61–1.24)
GFR, Estimated: >60 mL/min (ref >60)
Glucose, Bld: 156 mg/dL — ABNORMAL HIGH (ref 70–99)
Potassium: 4.1 mmol/L (ref 3.5–5.1)
Sodium: 127 mmol/L — ABNORMAL LOW (ref 135–145)

## 2022-08-29 NOTE — Care Management Important Message (Signed)
Important Message  Patient Details  Name: Cory Palmer MRN: [id number] Date of Birth: 11-Oct-1960   Medicare Important Message Given:  Other (see comment)     Hannah Beat 08/29/2022, 3:16 PM

## 2022-08-29 NOTE — Progress Notes (Signed)
1/29 Patient under Airborne and Contact Precautions. I made a good faith attempt to contact patien and patient's contact(s) by phone and could not. IMM Letter will be mailed to patient's address on file. Patient has received the initial copy of letter.

## 2022-08-29 NOTE — Progress Notes (Signed)
PROGRESS NOTE    [name]  TDV:[id number] DOB: 1961/07/04 DOA: 08/25/2022 PCP: Oneita Hurt, No    Brief Narrative:  Cory Palmer is a 62 y.o. male with medical history significant for type 2 diabetes, paroxysmal A-fib not on oral anticoagulation, hypertension, who presented with complaints of a cough fatigue and shortness of breath.  Endorses a chronic cough for months.  States he has lost weight and he has coughed up blood but none recently.  No trips outside the country.  Denies prior incarceration.  He is unsure whether or not he has had exposure to TB.     In the ED, chest x-ray showing almost complete opacification of the right upper lobe, personally reviewed.  Follow up PA and lateral chest x-ray risk recommended in 3 to 4 weeks following therapy to ensure resolution and exclude underlying malignancy.   COVID-19 screening test by PCR positive on 08/25/2022.  Not hypoxic.  Started on remdesivir for mild to moderate COVID-19 viral infection.  Also started on empiric IV antibiotics for concern for superimposed bacterial pulmonary infection.  TRH, hospitalist service was asked to admit.   Assessment and Plan: Right upper lobe pneumonia, seen on chest x-ray, POA due to strep pna Personally reviewed chest x-ray done on admission. Per radiology recommendation chest x-ray showing almost complete opacification of the right upper lobe, personally reviewed.  Follow up PA and lateral chest x-ray risk recommended in 3 to 4 weeks following therapy to ensure resolution and exclude underlying malignancy. - CT scan of chest-Large right upper lobe airspace opacity is noted with air bronchograms most consistent with pneumonia. Multiple small cavitating areas are noted within it suggesting cavitating pneumonia. Minimal right pleural effusion is noted. Enlarged mediastinal adenopathy is noted which is most likely reactive or inflammatory in etiology, but follow-up CT scan in 3-4 weeks is recommended to  ensure resolution. -IV abx for strep pan   COVID-19 viral infection, POA Positive COVID-19 screening test on 08/25/2022 3-day course of IV remdesivir As needed bronchodilators and as needed antitussives   Euvolemic hyponatremia -continue IVF and monitor   Elevated liver chemistries, unclear etiology Alkaline phosphatase 159, AST 49, T. bili 3.1. Avoid hepatotoxic agents Continue to monitor   Paroxysmal A-fib not on oral anticoagulation Patient not on rate control agents or blood thinner Monitor on telemetry   Prolonged QTc Avoid QTc prolonging agents Optimize magnesium and potassium levels.   Type 2 diabetes with hyperglycemia -SSI -not on metformin at home but will start here   Homelessness TOC consulted to assist with placement   Obesity Estimated body mass index is 34.06 kg/m as calculated from the following:   Height as of this encounter: 5\' 8"  (1.727 m).   Weight as of this encounter: 101.6 kg.        DVT prophylaxis: enoxaparin (LOVENOX) injection 40 mg Start: 08/26/22 1000    Code Status: Full Code   Disposition Plan:  Level of care: Progressive Status is: Inpatient Remains inpatient appropriate because: needs IV abx    Consultants:  none   Subjective: C/o cough  Objective: Vitals:   08/28/22 2004 08/28/22 2341 08/29/22 0300 08/29/22 0833  BP: 116/83 115/74 (!) 87/61 105/69  Pulse: 79 74 75   Resp: 12 20 19 18   Temp: 98.8 F (37.1 C) 98.1 F (36.7 C) 98.3 F (36.8 C) 98.6 F (37 C)  TempSrc: Oral Oral Oral Oral  SpO2: 91% 93% 94% 95%  Weight:      Height:  Intake/Output Summary (Last 24 hours) at 08/29/2022 1137 Last data filed at 08/29/2022 3710 Gross per 24 hour  Intake 860.45 ml  Output 1300 ml  Net -439.55 ml   Filed Weights   08/26/22 0801  Weight: 101.6 kg    Examination:    General: Appearance:    Obese male in no acute distress     Lungs:      respirations unlabored  Heart:    Normal heart rate. 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  Neurologic:   Awake, alert       Data Reviewed: I have personally reviewed following labs and imaging studies  CBC: Recent Labs  Lab 08/25/22 2119 08/26/22 0329 08/27/22 0219 08/28/22 0354 08/29/22 0402  WBC 16.2* 12.5* 13.0* 11.8* 12.1*  NEUTROABS 15.7*  --   --   --   --   HGB 13.9 11.7* 10.5* 11.1* 11.3*  HCT 38.4* 35.0* 30.0* 30.8* 31.6*  MCV 94.3 97.5 94.3 92.8 94.3  PLT 186 164 165 158 626*   Basic Metabolic Panel: Recent Labs  Lab 08/25/22 2119 08/26/22 0329 08/26/22 0607 08/27/22 0219 08/28/22 0354 08/29/22 0402  NA 126*  --  128* 126* 127* 127*  K 3.6  --  3.5 3.8 3.5 4.1  CL 92*  --  96* 96* 96* 97*  CO2 23  --  19* 20* 24 22  GLUCOSE 222*  --  182* 246* 218* 156*  BUN 22  --  21 14 10 12   CREATININE 0.83 0.68 0.71 0.63 0.61 0.52*  CALCIUM 8.4*  --  7.7* 7.8* 7.8* 7.7*  MG  --   --  1.9  --   --   --   PHOS  --   --  3.9  --   --   --    GFR: Estimated Creatinine Clearance: 110.6 mL/min (A) (by C-G formula based on SCr of 0.52 mg/dL (L)). Liver Function Tests: Recent Labs  Lab 08/25/22 2119  AST 49*  ALT 27  ALKPHOS 159*  BILITOT 3.1*  PROT 6.9  ALBUMIN 2.0*   Recent Labs  Lab 08/25/22 2119  LIPASE 28   No results for input(s): "AMMONIA" in the last 168 hours. Coagulation Profile: No results for input(s): "INR", "PROTIME" in the last 168 hours. Cardiac Enzymes: No results for input(s): "CKTOTAL", "CKMB", "CKMBINDEX", "TROPONINI" in the last 168 hours. BNP (last 3 results) No results for input(s): "PROBNP" in the last 8760 hours. HbA1C: No results for input(s): "HGBA1C" in the last 72 hours.  CBG: Recent Labs  Lab 08/28/22 0822 08/28/22 1212 08/28/22 1552 08/28/22 2006 08/29/22 0813  GLUCAP 210* 328* 178* 183* 133*   Lipid Profile: No results for input(s): "CHOL", "HDL", "LDLCALC", "TRIG", "CHOLHDL",  "LDLDIRECT" in the last 72 hours. Thyroid Function Tests: No results for input(s): "TSH", "T4TOTAL", "FREET4", "T3FREE", "THYROIDAB" in the last 72 hours. Anemia Panel: No results for input(s): "VITAMINB12", "FOLATE", "FERRITIN", "TIBC", "IRON", "RETICCTPCT" in the last 72 hours. Sepsis Labs: Recent Labs  Lab 08/26/22 0114 08/26/22 0253 08/27/22 0219  LATICACIDVEN 3.1* 3.5* 2.8*    Recent Results (from the past 240 hour(s))  Resp panel by RT-PCR (RSV, Flu A&B, Covid) Anterior Nasal Swab     Status: Abnormal   Collection Time: 08/25/22  9:08 PM   Specimen: Anterior Nasal Swab  Result Value Ref Range Status   SARS Coronavirus 2 by RT  PCR POSITIVE (A) NEGATIVE Final    Comment: (NOTE) SARS-CoV-2 target nucleic acids are DETECTED.  The SARS-CoV-2 RNA is generally detectable in upper respiratory specimens during the acute phase of infection. Positive results are indicative of the presence of the identified virus, but do not rule out bacterial infection or co-infection with other pathogens not detected by the test. Clinical correlation with patient history and other diagnostic information is necessary to determine patient infection status. The expected result is Negative.  Fact Sheet for Patients: EntrepreneurPulse.com.au  Fact Sheet for Healthcare Providers: IncredibleEmployment.be  This test is not yet approved or cleared by the [place] FDA and  has been authorized for detection and/or diagnosis of SARS-CoV-2 by FDA under an Emergency Use Authorization (EUA).  This EUA will remain in effect (meaning this test can be used) for the duration of  the COVID-19 declaration under Section 564(b)(1) of the A ct, 21 U.S.C. section 360bbb-3(b)(1), unless the authorization is terminated or revoked sooner.     Influenza A by PCR NEGATIVE NEGATIVE Final   Influenza B by PCR NEGATIVE NEGATIVE Final    Comment: (NOTE) The Xpert Xpress  SARS-CoV-2/FLU/RSV plus assay is intended as an aid in the diagnosis of influenza from Nasopharyngeal swab specimens and should not be used as a sole basis for treatment. Nasal washings and aspirates are unacceptable for Xpert Xpress SARS-CoV-2/FLU/RSV testing.  Fact Sheet for Patients: EntrepreneurPulse.com.au  Fact Sheet for Healthcare Providers: IncredibleEmployment.be  This test is not yet approved or cleared by the [place] FDA and has been authorized for detection and/or diagnosis of SARS-CoV-2 by FDA under an Emergency Use Authorization (EUA). This EUA will remain in effect (meaning this test can be used) for the duration of the COVID-19 declaration under Section 564(b)(1) of the Act, 21 U.S.C. section 360bbb-3(b)(1), unless the authorization is terminated or revoked.     Resp Syncytial Virus by PCR NEGATIVE NEGATIVE Final    Comment: (NOTE) Fact Sheet for Patients: EntrepreneurPulse.com.au  Fact Sheet for Healthcare Providers: IncredibleEmployment.be  This test is not yet approved or cleared by the [place] FDA and has been authorized for detection and/or diagnosis of SARS-CoV-2 by FDA under an Emergency Use Authorization (EUA). This EUA will remain in effect (meaning this test can be used) for the duration of the COVID-19 declaration under Section 564(b)(1) of the Act, 21 U.S.C. section 360bbb-3(b)(1), unless the authorization is terminated or revoked.  Performed at [facility], [place] [address], [place], [place] [address]   MRSA Next Gen by PCR, Nasal     Status: None   Collection Time: 08/26/22  9:43 PM   Specimen: Nasal Mucosa; Nasal Swab  Result Value Ref Range Status   MRSA by PCR Next Gen NOT DETECTED NOT DETECTED Final    Comment: (NOTE) The GeneXpert MRSA Assay (FDA approved for NASAL specimens only), is one component of a comprehensive MRSA colonization  surveillance program. It is not intended to diagnose MRSA infection nor to guide or monitor treatment for MRSA infections. Test performance is not FDA approved in patients less than 5 years old. Performed at [facility], [place] [address] [place],  [address]          Radiology Studies: CT CHEST WO CONTRAST  Result Date: 08/27/2022 CLINICAL DATA:  Pneumonia. EXAM: CT CHEST WITHOUT CONTRAST TECHNIQUE: Multidetector CT imaging of the chest was performed following the standard protocol without IV contrast. RADIATION DOSE REDUCTION: This exam was performed according to the departmental dose-optimization  program which includes automated exposure control, adjustment of the mA and/or kV according to patient size and/or use of iterative reconstruction technique. COMPARISON:  August 26, 2022.  October 10, 2020. FINDINGS: Cardiovascular: No evidence of thoracic aortic aneurysm. Normal cardiac size. No pericardial effusion. Mild coronary calcifications are noted. Mediastinum/Nodes: Small sliding-type hiatal hernia is noted. Thyroid gland is unremarkable. Mildly enlarged right paratracheal and precarinal lymph nodes are noted, most likely reactive or inflammatory in etiology. The largest measures 15 mm. Lungs/Pleura: Minimal right pleural effusion is noted. No pneumothorax is noted. Left lung is clear. Large right upper lobe airspace opacity is noted with air bronchograms most consistent with pneumonia. There appears to be multiple small cavitating areas within this abnormality. Interval development 3 mm nodule noted in right middle lobe best seen on image number 96 of series 8. Upper Abdomen: Continued presence of hepatic cirrhosis. Musculoskeletal: No chest wall mass or suspicious bone lesions identified. IMPRESSION: Large right upper lobe airspace opacity is noted with air bronchograms most consistent with pneumonia. Multiple small cavitating areas are noted within it suggesting cavitating  pneumonia. Minimal right pleural effusion is noted. Enlarged mediastinal adenopathy is noted which is most likely reactive or inflammatory in etiology, but follow-up CT scan in 3-4 weeks is recommended to ensure resolution. Interval development of 3 mm nodule seen in right middle lobe. Although likely benign, if the patient is high-risk, given the morphology and/or location of this nodule a non-contrast chest CT can be considered in 12 months.This recommendation follows the consensus statement: Guidelines for Management of Incidental Pulmonary Nodules Detected on CT Images: From the Fleischner Society 2017; Radiology 2017; 284:228-243. Hepatic cirrhosis. Mild coronary artery calcifications are noted. Small sliding-type hiatal hernia. Electronically Signed   By: Lupita Raider M.D.   On: 08/27/2022 15:46        Scheduled Meds:  vitamin C  500 mg Oral Daily   doxycycline  100 mg Oral Q12H   enoxaparin (LOVENOX) injection  40 mg Subcutaneous Daily   insulin aspart  0-15 Units Subcutaneous TID WC   insulin aspart  0-5 Units Subcutaneous QHS   insulin glargine-yfgn  10 Units Subcutaneous Daily   metFORMIN  500 mg Oral Q breakfast   multivitamin with minerals  1 tablet Oral Daily   nystatin   Topical TID   zinc sulfate  220 mg Oral Daily   Continuous Infusions:  sodium chloride 50 mL/hr at 08/28/22 1902   cefTRIAXone (ROCEPHIN)  IV 2 g (08/29/22 0826)     LOS: 3 days    Time spent: 45 minutes spent on chart review, discussion with nursing staff, consultants, updating family and interview/physical exam; more than 50% of that time was spent in counseling and/or coordination of care.    Joseph Art, DO [facility] Available via Epic secure chat 7am-7pm After these hours, please refer to coverage provider listed on [email or web address] 08/29/2022, 11:37 AM

## 2022-08-30 ENCOUNTER — Other Ambulatory Visit (HOSPITAL_COMMUNITY): Payer: Self-pay

## 2022-08-30 DIAGNOSIS — U071 COVID-19: Secondary | ICD-10-CM | POA: Diagnosis not present

## 2022-08-30 DIAGNOSIS — E871 Hypo-osmolality and hyponatremia: Secondary | ICD-10-CM | POA: Diagnosis not present

## 2022-08-30 DIAGNOSIS — J189 Pneumonia, unspecified organism: Secondary | ICD-10-CM | POA: Diagnosis not present

## 2022-08-30 LAB — BASIC METABOLIC PANEL
Anion gap: 5 (ref 5–15)
BUN: 11 mg/dL (ref 8–23)
CO2: 24 mmol/L (ref 22–32)
Calcium: 7.9 mg/dL — ABNORMAL LOW (ref 8.9–10.3)
Chloride: 98 mmol/L (ref 98–111)
Creatinine, Ser: 0.52 mg/dL — ABNORMAL LOW (ref 0.61–1.24)
GFR, Estimated: >60 mL/min (ref >60)
Glucose, Bld: 193 mg/dL — ABNORMAL HIGH (ref 70–99)
Potassium: 4.1 mmol/L (ref 3.5–5.1)
Sodium: 127 mmol/L — ABNORMAL LOW (ref 135–145)

## 2022-08-30 LAB — CBC
HCT: 33 % — ABNORMAL LOW (ref 39.0–52.0)
Hemoglobin: 11.4 g/dL — ABNORMAL LOW (ref 13.0–17.0)
MCH: 33 pg (ref 26.0–34.0)
MCHC: 34.5 g/dL (ref 30.0–36.0)
MCV: 95.7 fL (ref 80.0–100.0)
Platelets: 187 10*3/uL (ref 150–400)
RBC: 3.45 MIL/uL — ABNORMAL LOW (ref 4.22–5.81)
RDW: 13.9 % (ref 11.5–15.5)
WBC: 14.4 10*3/uL — ABNORMAL HIGH (ref 4.0–10.5)
nRBC: 0 % (ref 0.0–0.2)

## 2022-08-30 LAB — GLUCOSE, CAPILLARY: Glucose-Capillary: 247 mg/dL — ABNORMAL HIGH (ref 70–99)

## 2022-08-30 MED ORDER — METFORMIN HCL 500 MG PO TABS
500.0000 mg | ORAL_TABLET | Freq: Two times a day (BID) | ORAL | 0 refills | Status: DC
  Filled 2022-08-30: qty 60, 30d supply, fill #0

## 2022-08-30 MED ORDER — AMOXICILLIN-POT CLAVULANATE 875-125 MG PO TABS
1.0000 | ORAL_TABLET | Freq: Two times a day (BID) | ORAL | 0 refills | Status: DC
  Filled 2022-08-30: qty 12, 6d supply, fill #0

## 2022-08-30 MED ORDER — AMOXICILLIN-POT CLAVULANATE 875-125 MG PO TABS
1.0000 | ORAL_TABLET | Freq: Two times a day (BID) | ORAL | Status: DC
  Administered 2022-08-30: 1 via ORAL
  Filled 2022-08-30: qty 1

## 2022-08-30 MED ORDER — METFORMIN HCL 500 MG PO TABS: 500.0000 mg | ORAL_TABLET | Freq: Two times a day (BID) | ORAL | Status: DC

## 2022-08-30 NOTE — Discharge Summary (Signed)
Physician Discharge Summary  Cory Palmer ZSW:[id number] DOB: 1961/04/18 DOA: 08/25/2022  PCP: Pcp, No  Admit date: 08/25/2022 Discharge date: 08/30/2022  Admitted From: home Discharge disposition: home   Recommendations for Outpatient Follow-Up:   Patient leaving AMA-- will prescribe meds to facilitate a safe d/c including abx Will need follow x ray/CT scan BMP 1 week   Discharge Diagnosis:   Principal Problem:   Pneumonia    Discharge Condition: Improved.  Diet recommendation:   Carbohydrate-modified.  Wound care: None.  Code status: Full.   History of Present Illness:   Cory Palmer is a 62 y.o. male with medical history significant for type 2 diabetes, paroxysmal A-fib not on oral anticoagulation, hypertension, who presented with complaints of a cough fatigue and shortness of breath.  Endorses a chronic cough for months.  States he has lost weight and he has coughed up blood but none recently.  No trips outside the country.  Denies prior incarceration.  He is unsure whether or not he has had exposure to TB.     In the ED, chest x-ray showing almost complete opacification of the right upper lobe, personally reviewed.  Follow up PA and lateral chest x-ray risk recommended in 3 to 4 weeks following therapy to ensure resolution and exclude underlying malignancy.   COVID-19 screening test by PCR positive on 08/25/2022.  Not hypoxic.  Started on remdesivir for mild to moderate COVID-19 viral infection.  Also started on empiric IV antibiotics for concern for superimposed bacterial pulmonary infection.  TRH, hospitalist service was asked to admit.     Hospital Course by Problem:   Right upper lobe pneumonia, seen on chest x-ray, POA due to strep pna Personally reviewed chest x-ray done on admission. Per radiology recommendation chest x-ray showing almost complete opacification of the right upper lobe, personally reviewed.  Follow up PA and lateral chest x-ray risk  recommended in 3 to 4 weeks following therapy to ensure resolution and exclude underlying malignancy. - CT scan of chest-Large right upper lobe airspace opacity is noted with air bronchograms most consistent with pneumonia. Multiple small cavitating areas are noted within it suggesting cavitating pneumonia. Minimal right pleural effusion is noted. Enlarged mediastinal adenopathy is noted which is most likely reactive or inflammatory in etiology, but follow-up CT scan in 3-4 weeks is recommended to ensure resolution. -IV abx for strep pna-- changed to PO as patient wanting to leave AMA   COVID-19 viral infection, POA Positive COVID-19 screening test on 08/25/2022 3-day course of IV remdesivir As needed bronchodilators and as needed antitussives   Euvolemic hyponatremia -outpatient follow up   Elevated liver chemistries, unclear etiology Alkaline phosphatase 159, AST 49, T. bili 3.1. Avoid hepatotoxic agents -outpatient follow up   Paroxysmal A-fib not on oral anticoagulation Patient not on rate control agents or blood thinner Monitor on telemetry   Prolonged QTc Avoid QTc prolonging agents Optimize magnesium and potassium levels.   Type 2 diabetes with hyperglycemia -outpatient follow up   Homelessness TOC consulted to assist with placement   Obesity Estimated body mass index is 34.06 kg/m as calculated from the following:   Height as of this encounter: 5\' 8"  (1.727 m).   Weight as of this encounter: 101.6 kg.       Medical Consultants:      Discharge Exam:   Vitals:   08/30/22 0000 08/30/22 0821  BP: 119/67 111/68  Pulse: 74 78  Resp:  16  Temp: (!) 97.5 F (  36.4 C) 98.4 F (36.9 C)  SpO2: 96% 95%   Vitals:   08/29/22 1200 08/29/22 2012 08/30/22 0000 08/30/22 0821  BP: 101/67 124/72 119/67 111/68  Pulse:  77 74 78  Resp:    16  Temp: 98 F (36.7 C) 98.8 F (37.1 C) (!) 97.5 F (36.4 C) 98.4 F (36.9 C)  TempSrc: Oral Oral Oral Oral  SpO2:  94%  96% 95%  Weight:      Height:        Saying he is leaving AMA- says he will return to the hospital if he feels worse   The results of significant diagnostics from this hospitalization (including imaging, microbiology, ancillary and laboratory) are listed below for reference.     Procedures and Diagnostic Studies:   DG Chest 2 View  Result Date: 08/26/2022 CLINICAL DATA:  cough EXAM: CHEST - 2 VIEW COMPARISON:  CT chest 10/10/2020 FINDINGS: The heart and mediastinal contours are within normal limits. Almost complete opacification of the right upper lobe. No pulmonary edema. No pleural effusion. No pneumothorax. No acute osseous abnormality. IMPRESSION: Almost complete opacification of the right upper lobe. Followup PA and lateral chest X-ray is recommended in 3-4 weeks following therapy to ensure resolution and exclude underlying malignancy. Electronically Signed   By: Tish Frederickson M.D.   On: 08/26/2022 00:27     Labs:   Basic Metabolic Panel: Recent Labs  Lab 08/26/22 0607 08/27/22 0219 08/28/22 0354 08/29/22 0402 08/30/22 0516  NA 128* 126* 127* 127* 127*  K 3.5 3.8 3.5 4.1 4.1  CL 96* 96* 96* 97* 98  CO2 19* 20* 24 22 24   GLUCOSE 182* 246* 218* 156* 193*  BUN 21 14 10 12 11   CREATININE 0.71 0.63 0.61 0.52* 0.52*  CALCIUM 7.7* 7.8* 7.8* 7.7* 7.9*  MG 1.9  --   --   --   --   PHOS 3.9  --   --   --   --    GFR Estimated Creatinine Clearance: 110.6 mL/min (A) (by C-G formula based on SCr of 0.52 mg/dL (L)). Liver Function Tests: Recent Labs  Lab 08/25/22 2119  AST 49*  ALT 27  ALKPHOS 159*  BILITOT 3.1*  PROT 6.9  ALBUMIN 2.0*   Recent Labs  Lab 08/25/22 2119  LIPASE 28   No results for input(s): "AMMONIA" in the last 168 hours. Coagulation profile No results for input(s): "INR", "PROTIME" in the last 168 hours.  CBC: Recent Labs  Lab 08/25/22 2119 08/26/22 0329 08/27/22 0219 08/28/22 0354 08/29/22 0402 08/30/22 0516  WBC 16.2* 12.5* 13.0*  11.8* 12.1* 14.4*  NEUTROABS 15.7*  --   --   --   --   --   HGB 13.9 11.7* 10.5* 11.1* 11.3* 11.4*  HCT 38.4* 35.0* 30.0* 30.8* 31.6* 33.0*  MCV 94.3 97.5 94.3 92.8 94.3 95.7  PLT 186 164 165 158 148* 187   Cardiac Enzymes: No results for input(s): "CKTOTAL", "CKMB", "CKMBINDEX", "TROPONINI" in the last 168 hours. BNP: Invalid input(s): "POCBNP" CBG: Recent Labs  Lab 08/29/22 0813 08/29/22 1205 08/29/22 1612 08/29/22 2149 08/30/22 0819  GLUCAP 133* 186* 223* 233* 247*   D-Dimer No results for input(s): "DDIMER" in the last 72 hours. Hgb A1c No results for input(s): "HGBA1C" in the last 72 hours. Lipid Profile No results for input(s): "CHOL", "HDL", "LDLCALC", "TRIG", "CHOLHDL", "LDLDIRECT" in the last 72 hours. Thyroid function studies No results for input(s): "TSH", "T4TOTAL", "T3FREE", "THYROIDAB" in the last 72 hours.  Invalid input(s): "FREET3" Anemia work up No results for input(s): "VITAMINB12", "FOLATE", "FERRITIN", "TIBC", "IRON", "RETICCTPCT" in the last 72 hours. Microbiology Recent Results (from the past 240 hour(s))  Resp panel by RT-PCR (RSV, Flu A&B, Covid) Anterior Nasal Swab     Status: Abnormal   Collection Time: 08/25/22  9:08 PM   Specimen: Anterior Nasal Swab  Result Value Ref Range Status   SARS Coronavirus 2 by RT PCR POSITIVE (A) NEGATIVE Final    Comment: (NOTE) SARS-CoV-2 target nucleic acids are DETECTED.  The SARS-CoV-2 RNA is generally detectable in upper respiratory specimens during the acute phase of infection. Positive results are indicative of the presence of the identified virus, but do not rule out bacterial infection or co-infection with other pathogens not detected by the test. Clinical correlation with patient history and other diagnostic information is necessary to determine patient infection status. The expected result is Negative.  Fact Sheet for Patients: EntrepreneurPulse.com.au  Fact Sheet for  Healthcare Providers: IncredibleEmployment.be  This test is not yet approved or cleared by the [place] FDA and  has been authorized for detection and/or diagnosis of SARS-CoV-2 by FDA under an Emergency Use Authorization (EUA).  This EUA will remain in effect (meaning this test can be used) for the duration of  the COVID-19 declaration under Section 564(b)(1) of the A ct, 21 U.S.C. section 360bbb-3(b)(1), unless the authorization is terminated or revoked sooner.     Influenza A by PCR NEGATIVE NEGATIVE Final   Influenza B by PCR NEGATIVE NEGATIVE Final    Comment: (NOTE) The Xpert Xpress SARS-CoV-2/FLU/RSV plus assay is intended as an aid in the diagnosis of influenza from Nasopharyngeal swab specimens and should not be used as a sole basis for treatment. Nasal washings and aspirates are unacceptable for Xpert Xpress SARS-CoV-2/FLU/RSV testing.  Fact Sheet for Patients: EntrepreneurPulse.com.au  Fact Sheet for Healthcare Providers: IncredibleEmployment.be  This test is not yet approved or cleared by the [place] FDA and has been authorized for detection and/or diagnosis of SARS-CoV-2 by FDA under an Emergency Use Authorization (EUA). This EUA will remain in effect (meaning this test can be used) for the duration of the COVID-19 declaration under Section 564(b)(1) of the Act, 21 U.S.C. section 360bbb-3(b)(1), unless the authorization is terminated or revoked.     Resp Syncytial Virus by PCR NEGATIVE NEGATIVE Final    Comment: (NOTE) Fact Sheet for Patients: EntrepreneurPulse.com.au  Fact Sheet for Healthcare Providers: IncredibleEmployment.be  This test is not yet approved or cleared by the [place] FDA and has been authorized for detection and/or diagnosis of SARS-CoV-2 by FDA under an Emergency Use Authorization (EUA). This EUA will remain in effect (meaning this  test can be used) for the duration of the COVID-19 declaration under Section 564(b)(1) of the Act, 21 U.S.C. section 360bbb-3(b)(1), unless the authorization is terminated or revoked.  Performed at [facility], [place] [address], [place],  [address]   MRSA Next Gen by PCR, Nasal     Status: None   Collection Time: 08/26/22  9:43 PM   Specimen: Nasal Mucosa; Nasal Swab  Result Value Ref Range Status   MRSA by PCR Next Gen NOT DETECTED NOT DETECTED Final    Comment: (NOTE) The GeneXpert MRSA Assay (FDA approved for NASAL specimens only), is one component of a comprehensive MRSA colonization surveillance program. It is not intended to diagnose MRSA infection nor to guide or monitor treatment for MRSA infections. Test performance is not FDA approved in patients  less than 26 years old. Performed at [facility], [place] [address] [place], [place] [address]      Discharge Instructions:   Discharge Instructions     Diet Carb Modified   Complete by: As directed    Discharge instructions   Complete by: As directed    You will need a repeat chest x ray in 4 weeks and a repeat chest CT in 6-12 months Bmp 1 week re: Na   Increase activity slowly   Complete by: As directed       Allergies as of 08/30/2022       Reactions   Atorvastatin Calcium Nausea And Vomiting   Gemfibrozil    Unknown   Insulin Glargine    Unknown        Medication List     STOP taking these medications    amLODipine 5 MG tablet Commonly known as: NORVASC   doxycycline 100 MG capsule Commonly known as: VIBRAMYCIN   hydrOXYzine 25 MG capsule Commonly known as: VISTARIL   loperamide 2 MG capsule Commonly known as: Imodium A-D   metoprolol succinate 25 MG 24 hr tablet Commonly known as: TOPROL-XL   pravastatin 40 MG tablet Commonly known as: PRAVACHOL   QUEtiapine 50 MG tablet Commonly known as: SEROQUEL   sertraline 50 MG tablet Commonly known as: ZOLOFT   traZODone  50 MG tablet Commonly known as: DESYREL       TAKE these medications    amoxicillin-clavulanate 875-125 MG tablet Commonly known as: AUGMENTIN Take 1 tablet by mouth every 12 (twelve) hours.   metFORMIN 500 MG tablet Commonly known as: GLUCOPHAGE Take 1 tablet (500 mg total) by mouth 2 (two) times daily with a meal. What changed:  medication strength how much to take        Follow-up Information     [facility] Follow up.   Why: Please call to establish a priamry care doctor. THey have finacial counseling nad a pharmacy Contact information: [place] [address] [phone]                 Time coordinating discharge: 35 min  Signed:  Geradine Girt DO  [facility] 08/30/2022, 2:19 PM

## 2022-08-30 NOTE — Progress Notes (Signed)
I was able to contact TOC and get meds expedited,  was will to wait a couple minutes.  I delivered to patient and call cab service.  Waited for cab arrival and he left.

## 2022-08-30 NOTE — Progress Notes (Addendum)
Patient placed a pillow on the floor, placed his lower residual limbs on the pillow, and was sliding on the floor with the pillow. Patient trying to get in his wheelchair to leave. He stated he has someone coming to pick him up and he is leaving. Patient stated he was calling the police on staff because we would not let him leave at this time. Patient back in bed, call light in reach, and bed alarm back on. Clarene Essex, NP notified. Will continue to monitor patient throughout the night.

## 2022-08-30 NOTE — Progress Notes (Signed)
Patient is refusing to wait for meds to be delivered to room.  Needs ABX but will now wait.

## 2022-08-31 ENCOUNTER — Other Ambulatory Visit (HOSPITAL_COMMUNITY): Payer: Self-pay

## 2022-09-01 LAB — QUANTIFERON-TB GOLD PLUS: QuantiFERON-TB Gold Plus: UNDETERMINED — AB

## 2022-09-01 LAB — QUANTIFERON-TB GOLD PLUS (RQFGPL)
QuantiFERON Mitogen Value: 0.04 IU/mL
QuantiFERON Nil Value: 0.09 IU/mL
QuantiFERON TB1 Ag Value: 0 IU/mL
QuantiFERON TB2 Ag Value: 0.03 IU/mL

## 2022-09-10 ENCOUNTER — Emergency Department (HOSPITAL_COMMUNITY): Payer: 59

## 2022-09-10 ENCOUNTER — Other Ambulatory Visit: Payer: Self-pay

## 2022-09-10 ENCOUNTER — Emergency Department (HOSPITAL_COMMUNITY)
Admission: EM | Admit: 2022-09-10 | Discharge: 2022-09-10 | Disposition: A | Discharge status: Home / Self Care | Payer: 59 | Attending: Emergency Medicine | Admitting: Emergency Medicine

## 2022-09-10 ENCOUNTER — Encounter (HOSPITAL_COMMUNITY): Payer: Self-pay

## 2022-09-10 DIAGNOSIS — E119 Type 2 diabetes mellitus without complications: Secondary | ICD-10-CM | POA: Diagnosis not present

## 2022-09-10 DIAGNOSIS — Z7984 Long term (current) use of oral hypoglycemic drugs: Secondary | ICD-10-CM | POA: Insufficient documentation

## 2022-09-10 DIAGNOSIS — Z5900 Homelessness unspecified: Secondary | ICD-10-CM | POA: Diagnosis not present

## 2022-09-10 DIAGNOSIS — R5383 Other fatigue: Secondary | ICD-10-CM | POA: Diagnosis not present

## 2022-09-10 DIAGNOSIS — R059 Cough, unspecified: Secondary | ICD-10-CM | POA: Insufficient documentation

## 2022-09-10 DIAGNOSIS — J3489 Other specified disorders of nose and nasal sinuses: Secondary | ICD-10-CM | POA: Diagnosis not present

## 2022-09-10 DIAGNOSIS — R45851 Suicidal ideations: Secondary | ICD-10-CM | POA: Diagnosis not present

## 2022-09-10 DIAGNOSIS — R000 Tachycardia, unspecified: Secondary | ICD-10-CM | POA: Insufficient documentation

## 2022-09-10 DIAGNOSIS — I10 Essential (primary) hypertension: Secondary | ICD-10-CM | POA: Insufficient documentation

## 2022-09-10 DIAGNOSIS — F319 Bipolar disorder, unspecified: Secondary | ICD-10-CM | POA: Diagnosis present

## 2022-09-10 DIAGNOSIS — R062 Wheezing: Secondary | ICD-10-CM | POA: Insufficient documentation

## 2022-09-10 DIAGNOSIS — Z79899 Other long term (current) drug therapy: Secondary | ICD-10-CM | POA: Insufficient documentation

## 2022-09-10 DIAGNOSIS — Z1152 Encounter for screening for COVID-19: Secondary | ICD-10-CM | POA: Insufficient documentation

## 2022-09-10 LAB — ETHANOL: Alcohol, Ethyl (B): <10 mg/dL (ref <10)

## 2022-09-10 LAB — CBC WITH DIFFERENTIAL/PLATELET
Abs Immature Granulocytes: 0.01 10*3/uL (ref 0.00–0.07)
Basophils Absolute: 0 10*3/uL (ref 0.0–0.1)
Basophils Relative: 0 %
Eosinophils Absolute: 0.1 10*3/uL (ref 0.0–0.5)
Eosinophils Relative: 3 %
HCT: 32.3 % — ABNORMAL LOW (ref 39.0–52.0)
Hemoglobin: 10.5 g/dL — ABNORMAL LOW (ref 13.0–17.0)
Immature Granulocytes: 0 %
Lymphocytes Relative: 17 %
Lymphs Abs: 0.6 10*3/uL — ABNORMAL LOW (ref 0.7–4.0)
MCH: 32.9 pg (ref 26.0–34.0)
MCHC: 32.5 g/dL (ref 30.0–36.0)
MCV: 101.3 fL — ABNORMAL HIGH (ref 80.0–100.0)
Monocytes Absolute: 0.4 10*3/uL (ref 0.1–1.0)
Monocytes Relative: 11 %
Neutro Abs: 2.3 10*3/uL (ref 1.7–7.7)
Neutrophils Relative %: 69 %
Platelets: 108 10*3/uL — ABNORMAL LOW (ref 150–400)
RBC: 3.19 MIL/uL — ABNORMAL LOW (ref 4.22–5.81)
RDW: 15.6 % — ABNORMAL HIGH (ref 11.5–15.5)
WBC: 3.3 10*3/uL — ABNORMAL LOW (ref 4.0–10.5)
nRBC: 0 % (ref 0.0–0.2)

## 2022-09-10 LAB — ACETAMINOPHEN LEVEL: Acetaminophen (Tylenol), Serum: <10 ug/mL — ABNORMAL LOW (ref 10–30)

## 2022-09-10 LAB — COMPREHENSIVE METABOLIC PANEL
ALT: 22 U/L (ref 0–44)
AST: 28 U/L (ref 15–41)
Albumin: 2.3 g/dL — ABNORMAL LOW (ref 3.5–5.0)
Alkaline Phosphatase: 150 U/L — ABNORMAL HIGH (ref 38–126)
Anion gap: 6 (ref 5–15)
BUN: 8 mg/dL (ref 8–23)
CO2: 23 mmol/L (ref 22–32)
Calcium: 8 mg/dL — ABNORMAL LOW (ref 8.9–10.3)
Chloride: 108 mmol/L (ref 98–111)
Creatinine, Ser: 0.42 mg/dL — ABNORMAL LOW (ref 0.61–1.24)
GFR, Estimated: >60 mL/min (ref >60)
Glucose, Bld: 196 mg/dL — ABNORMAL HIGH (ref 70–99)
Potassium: 3.4 mmol/L — ABNORMAL LOW (ref 3.5–5.1)
Sodium: 137 mmol/L (ref 135–145)
Total Bilirubin: 1.2 mg/dL (ref 0.3–1.2)
Total Protein: 6.4 g/dL — ABNORMAL LOW (ref 6.5–8.1)

## 2022-09-10 LAB — RESP PANEL BY RT-PCR (RSV, FLU A&B, COVID)  RVPGX2
Influenza A by PCR: NEGATIVE
Influenza B by PCR: NEGATIVE
Resp Syncytial Virus by PCR: NEGATIVE
SARS Coronavirus 2 by RT PCR: NEGATIVE

## 2022-09-10 LAB — CBG MONITORING, ED: Glucose-Capillary: 187 mg/dL — ABNORMAL HIGH (ref 70–99)

## 2022-09-10 LAB — SALICYLATE LEVEL: Salicylate Lvl: <7 mg/dL — ABNORMAL LOW (ref 7.0–30.0)

## 2022-09-10 MED ORDER — ALBUTEROL SULFATE (2.5 MG/3ML) 0.083% IN NEBU
5.0000 mg | INHALATION_SOLUTION | Freq: Once | RESPIRATORY_TRACT | Status: AC
  Administered 2022-09-10: 5 mg via RESPIRATORY_TRACT
  Filled 2022-09-10: qty 6

## 2022-09-10 NOTE — ED Provider Notes (Signed)
Spoke with psychiatry, Michaele Offer, NP who is psychiatrically clearing patient for discharge. Patient medically cleared prior to this. He was given resources for shelters and return precautions.    Mickie Hillier, PA-C 09/10/22 1314    Valarie Merino, MD 09/11/22 [phone]

## 2022-09-10 NOTE — Discharge Instructions (Addendum)
You were seen today for cough and for suicidal ideations. You chest x-ray appears to be improving. Your labs were slightly abnormal and we would prefer that you have follow-up to have these rechecked in the next week. I have provided the information for [facility] to have these labs redrawn. You spoke with our psychiatry team and they feel comfortable with you being taken care of in the outpatient setting. Please return to the emergency department for worsening shortness of breath or difficulty breathing, or other emergencies. Thank you.

## 2022-09-10 NOTE — Discharge Summary (Signed)
[facility] ED Discharge  09/10/2022 1:46 PM Cory Palmer  MRN:  [id number]  Principal Problem: Suicide ideation Discharge Diagnoses: Principal Problem:   Suicide ideation Active Problems:   Homelessness   Depressed bipolar disorder ([place])  Clinical Impression:  Final diagnoses:  Other fatigue  Suicidal ideations    ED Assessment Time Calculation: Start Time: 0930 Stop Time: 0950 Total Time in Minutes (Assessment Completion): 20   Subjective: Cory Palmer, 62 y.o., male patient seen face to face by this provider, consulted with Dr. Dwyane Dee; and chart reviewed on 09/10/22.  On evaluation Cory Palmer reports that he sometimes feels suicidal due to his medical issues.  Provider asked if he was having any suicidal thoughts, patient calls for a while and said let me think.  Patient is not very talkative, says that he is homeless and that he does not want to go back to the [facility] because the doctor threw his medications away, and he just needed some rest.  Provider has to constantly redirect patient and asked questions, the patient does not want to provide and he is very guarded.  Provider asked patient to have appetite and sleep are, patient sarcastically said good while here in the hospital, trying to get some sleep now, but you keep talking.  During evaluation Cory Palmer is laying in hospital bed in no acute distress. He is alert, partially oriented, irritable during assessment.  His mood is irritable with congruent affect. He has verbal speech, and behavior.  Objectively there is no evidence of psychosis/mania or delusional thinking.  He denies homicidal ideation, psychosis, and paranoia.  Endorses passive SI, unable to tell this Probation officer, any plans, intent, or how he would do it. Patient has a history of bilateral amputation, does not want to talk about it.  Patient frequents the ER with mental health issues including suicidal ideation.  Patient very sarcastic and irritable during assessment,  very guarded.  Patient alcohol level less than 10, patient would not give a UDS, his last UDS on August 17, 2022 was negative for any illicit substances. Patient is advised to see his PCP and Cardiologist.  Referral is also is given for Mental healthcare at [facility].  Patient is Psychiatrically cleared.  Consulted with Dr. Dwyane Dee, she is in agreement with patient being psychiatrically cleared.   At this time Cory Palmer is educated and verbalizes understanding of mental health resources and other crisis services in the community. He is instructed to call 911 and present to the nearest emergency room should he experience any suicidal/homicidal ideation, auditory/visual/hallucinations, or detrimental worsening of his mental health condition.    Past Psychiatric History: depression, anxiety, Bipolar Depression and Polysubstance abuse. Patient was receiving Mental healthcare at [facility] but have not been there for years he says. No outpatient Mental healthcare at this time.    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    Past Surgical History:  Procedure Laterality Date   AMPUTATION Left 09/28/2018   Procedure: LEFT TRANSMETATARSAL AMPUTATION;  Surgeon: Newt Minion, MD;  Location: [place];  Service:  Orthopedics;  Laterality: Left;   AMPUTATION Left 10/23/2019   Procedure: AMPUTATION BELOW KNEE;  Surgeon: Newt Minion, MD;  Location: [place];  Service: Orthopedics;  Laterality: Left;   BELOW KNEE LEG AMPUTATION Right    ESOPHAGOGASTRODUODENOSCOPY (EGD) WITH PROPOFOL N/A 10/19/2020   Procedure:  ESOPHAGOGASTRODUODENOSCOPY (EGD) WITH PROPOFOL;  Surgeon: Juanita Craver, MD;  Location: [facility] ENDOSCOPY;  Service: Endoscopy;  Laterality: N/A;   TEE WITHOUT CARDIOVERSION N/A 10/25/2019   Procedure: TRANSESOPHAGEAL ECHOCARDIOGRAM (TEE);  Surgeon: Lelon Perla, MD;  Location: [facility] ENDOSCOPY;  Service: Cardiovascular;  Laterality: N/A;   Family History:  Family History  Problem Relation Age of Onset   Hypertension Other    Diabetes Mellitus II Other      Social History:  Social History   Substance and Sexual Activity  Alcohol Use Not Currently     Social History   Substance and Sexual Activity  Drug Use Not Currently   Types: Cocaine   Comment: Last used in July 2023    Social History   Socioeconomic History   Marital status: Single    Spouse name: Not on file   Number of children: Not on file   Years of education: Not on file   Highest education level: Not on file  Occupational History   Not on file  Tobacco Use   Smoking status: Every Day    Packs/day: 0.25    Types: Cigarettes   Smokeless tobacco: Never  Vaping Use   Vaping Use: Never used  Substance and Sexual Activity   Alcohol use: Not Currently   Drug use: Not Currently    Types: Cocaine    Comment: Last used in July 2023   Sexual activity: Not Currently  Other Topics Concern   Not on file  Social History Narrative   Not on file   Social Determinants of Health   Financial Resource Strain: Unknown (05/07/2018)   Overall Financial Resource Strain (CARDIA)    Difficulty of Paying Living Expenses: Patient refused  Food Insecurity: Unknown (05/07/2018)   Hunger Vital Sign    Worried About Running Out of Food in the Last Year: Patient refused    [place] in the Last Year: Patient refused  Transportation Needs: Unknown (05/07/2018)   PRAPARE - Hydrologist (Medical): Patient refused    Lack of Transportation (Non-Medical): Patient refused  Physical Activity: Unknown  (05/07/2018)   Exercise Vital Sign    Days of Exercise per Week: Patient refused    Minutes of Exercise per Session: Patient refused  Stress: Not on file  Social Connections: Not on file    Tobacco Cessation:  A prescription for an FDA-approved tobacco cessation medication was offered at discharge and the patient refused  Current Medications: No current facility-administered medications for this encounter.   Current Outpatient Medications  Medication Sig Dispense Refill   amoxicillin-clavulanate (AUGMENTIN) 875-125 MG tablet Take 1 tablet by mouth every 12 (twelve) hours. 12 tablet 0   metFORMIN (GLUCOPHAGE) 500 MG tablet Take 1 tablet (500 mg total) by mouth 2 (two) times daily with a meal. 60 tablet 0   PTA Medications: (Not in a hospital admission)   [place] Scale:  [place] ED from 09/10/2022 in [facility] Emergency Department at [facility] ED to Hosp-Admission (Discharged) from 08/25/2022 in [place] ED from 08/16/2022 in [facility] Emergency Department at [place] CATEGORY High Risk No Risk  Moderate Risk       Musculoskeletal: Strength & Muscle Tone: Decreased Gait & Station: unable to stand, patient has leg amputation Patient leans: Front  Psychiatric Specialty Exam: Presentation  General Appearance:  Appropriate for Environment  Eye Contact: Minimal  Speech: Garbled  Speech Volume: Normal  Handedness: Right   Mood and Affect  Mood: Euthymic  Affect: Appropriate   Thought Process  Thought Processes: Coherent  Descriptions of Associations:Intact  Orientation:Partial  Thought Content:WDL  History of Schizophrenia/Schizoaffective disorder:No  Duration of Psychotic Symptoms:N/A  Hallucinations:Hallucinations: Other (comment)  Ideas of Reference:None  Suicidal Thoughts:Suicidal Thoughts: No  Homicidal Thoughts:Homicidal Thoughts: No   Sensorium  Memory: Immediate  Fair; Remote Fair  Judgment: Fair  Insight: Fair   Community education officer  Concentration: Fair  Attention Span: Fair  Recall: Good  Fund of Knowledge: Good  Language: Good   Psychomotor Activity  Psychomotor Activity:No data recorded  Assets  Assets: Communication Skills   Sleep  Sleep: Sleep: Fair    Physical Exam: Physical Exam Vitals and nursing note reviewed. Exam conducted with a chaperone present.  Pulmonary:     Effort: Pulmonary effort is normal.  Neurological:     Mental Status: He is alert.  Psychiatric:        Attention and Perception: He is inattentive.        Mood and Affect: Affect is labile.        Speech: Speech normal.        Behavior: Behavior normal.        Thought Content: Thought content normal.        Judgment: Judgment normal.    Review of Systems  HENT: Negative.    Respiratory: Negative.    Skin: Negative.   Psychiatric/Behavioral: Negative.     Blood pressure 129/88, pulse (!) 101, temperature 98.2 F (36.8 C), temperature source Oral, resp. rate 18, SpO2 99 %. There is no height or weight on file to calculate BMI.   Demographic Factors:  Male, Caucasian, Low socioeconomic status, and Unemployed  Loss Factors: Loss of significant relationship  Historical Factors: Impulsivity  Risk Reduction Factors:   Positive therapeutic relationship  Continued Clinical Symptoms:  More than one psychiatric diagnosis  Suicide Risk:  Mild:  Suicidal ideation of limited frequency, intensity, duration, and specificity.  There are no identifiable plans, no associated intent, mild dysphoria and related symptoms, good self-control (both objective and subjective assessment), few other risk factors, and identifiable protective factors, including available and accessible social support.   Follow-up Information     [facility] [facility] at [facility] .   Specialty: Emergency Medicine Why: If symptoms  worsen Contact information: [id number] [place]. Go in 1 week.   Why: CBC and BMP recheck Contact information: [place] [phone] [place] Follow up.   Specialty: Urgent Care Why: If symptoms worsen, As needed Contact information: [place] Follow up.   Why: As needed, If symptoms worsen Contact information: [place] [address] [phone]                 Medical Decision Making: Patient is psychiatrically cleared, given resources to [facility]  and behavioral health urgent care.  Patient is also given a voucher for transportation.    Disposition: Discharge  Michaele Offer, [place] 09/10/2022, 1:46 PM

## 2022-09-10 NOTE — ED Provider Notes (Signed)
[facility] Provider Note   CSN: [id number] Arrival date & time: 09/10/22  8127     History  Chief Complaint  Patient presents with  . Fatigue    Cory Palmer is a 62 y.o. male. With past medical history of type 2 diabetes, hypertension, PAF, GERD, alcohol and cocaine abuse, cirrhosis, bipolar disorder, suicidal ideations, unhoused who presents to the emergency department with fatigue.   Patient has multiple complaints. Firstly, he states that he is suicidal with plan. States that he has been suicidal for months. He states that he is going to shoot himself with his gun. He tells me he has "multiple guns" but does not tell me where these are located. He denies HI/AVH. He states that at his homelessness shelter the "doctor there threw away all of my psychiatric medications and seroquel. I haven't slept well in months." He states that he found Seroquel off the street last night and took 3 doses of that. Additionally, has been drinking alcohol. States he had a few cups of wine last night. Last drink at 7pm. He denies feeling like he is in withdrawal and denies previous withdrawal seizures. He denies other drug use at this time.   Additionally, he is complaining of cough. States he has been coughing for months. He describes having dark sputum. Describes getting into coughing fits and then feeling short of breath. He denies chills/diaphoresis, chest pain, palpitations.   On chart review, he was admitted from 08/25/22-08/30/22. At that time on ED presentation had complete opacification of the RUL. Had COVID. Found to have strep pna and was treated with IV abx. He had a CT scan at that time showing a large RUL cavitary lesions. Thought to be cavitary pna. He had x2 quantiferon labs. One was indeterminate and the subsequent testing was negative.    HPI     Home Medications Prior to Admission medications   Medication Sig Start Date End Date Taking?  Authorizing Provider  amoxicillin-clavulanate (AUGMENTIN) 875-125 MG tablet Take 1 tablet by mouth every 12 (twelve) hours. 08/30/22   Geradine Girt, DO  metFORMIN (GLUCOPHAGE) 500 MG tablet Take 1 tablet (500 mg total) by mouth 2 (two) times daily with a meal. 08/30/22   Geradine Girt, DO      Allergies    Atorvastatin calcium, Gemfibrozil, and Insulin glargine    Review of Systems   Review of Systems  Respiratory:  Positive for cough, shortness of breath and wheezing.   Psychiatric/Behavioral:  Positive for agitation and suicidal ideas.   All other systems reviewed and are negative.   Physical Exam Updated Vital Signs BP 129/88   Pulse (!) 101   Temp 98.2 F (36.8 C) (Oral)   Resp 18   SpO2 99%  Physical Exam Vitals and nursing note reviewed.  Constitutional:      General: He is not in acute distress.    Appearance: He is ill-appearing.  HENT:     Head: Normocephalic.     Mouth/Throat:     Mouth: Mucous membranes are dry.     Pharynx: Oropharynx is clear.  Eyes:     General: No scleral icterus.    Extraocular Movements: Extraocular movements intact.  Cardiovascular:     Rate and Rhythm: Regular rhythm. Tachycardia present.     Pulses: Normal pulses.          Radial pulses are 2+ on the right side and 2+ on the left side.  Heart sounds: No murmur heard. Pulmonary:     Effort: Pulmonary effort is normal. No respiratory distress.     Breath sounds: No stridor. Examination of the right-upper field reveals decreased breath sounds, wheezing and rhonchi. Examination of the left-upper field reveals wheezing. Examination of the right-middle field reveals rhonchi. Decreased breath sounds, wheezing and rhonchi present.  Abdominal:     General: Bowel sounds are normal.     Palpations: Abdomen is soft.     Tenderness: There is no abdominal tenderness.  Musculoskeletal:     Cervical back: Neck supple.     Comments: Bilateral BKA     Right Lower Extremity: Right leg is  amputated below knee.     Left Lower Extremity: Left leg is amputated below knee.  Skin:    General: Skin is warm and dry.     Capillary Refill: Capillary refill takes less than 2 seconds.  Neurological:     General: No focal deficit present.     Mental Status: He is alert and oriented to person, place, and time.  Psychiatric:        Attention and Perception: Attention normal.        Mood and Affect: Affect is labile and tearful.        Behavior: Behavior is hyperactive.        Thought Content: Thought content includes suicidal ideation. Thought content does not include homicidal ideation. Thought content includes suicidal plan.        Cognition and Memory: Memory normal.        Judgment: Judgment is impulsive and inappropriate.     ED Results / Procedures / Treatments   Labs (all labs ordered are listed, but only abnormal results are displayed) Labs Reviewed  COMPREHENSIVE METABOLIC PANEL - Abnormal; Notable for the following components:      Result Value   Potassium 3.4 (*)    Glucose, Bld 196 (*)    Creatinine, Ser 0.42 (*)    Calcium 8.0 (*)    Total Protein 6.4 (*)    Albumin 2.3 (*)    Alkaline Phosphatase 150 (*)    All other components within normal limits  CBC WITH DIFFERENTIAL/PLATELET - Abnormal; Notable for the following components:   WBC 3.3 (*)    RBC 3.19 (*)    Hemoglobin 10.5 (*)    HCT 32.3 (*)    MCV 101.3 (*)    RDW 15.6 (*)    Platelets 108 (*)    Lymphs Abs 0.6 (*)    All other components within normal limits  SALICYLATE LEVEL - Abnormal; Notable for the following components:   Salicylate Lvl <5.9 (*)    All other components within normal limits  ACETAMINOPHEN LEVEL - Abnormal; Notable for the following components:   Acetaminophen (Tylenol), Serum <10 (*)    All other components within normal limits  CBG MONITORING, ED - Abnormal; Notable for the following components:   Glucose-Capillary 187 (*)    All other components within normal limits   RESP PANEL BY RT-PCR (RSV, FLU A&B, COVID)  [id number]  ETHANOL  RAPID URINE DRUG SCREEN, HOSP PERFORMED    EKG EKG Interpretation  Date/Time:  Saturday September 10 2022 08:17:04 EST Ventricular Rate:  106 PR Interval:    QRS Duration: 99 QT Interval:  376 QTC Calculation: 500 R Axis:   61 Text Interpretation: Atrial fibrillation Anteroseptal infarct, age indeterminate Confirmed by Dene Gentry [phone]) on 09/10/2022 8:41:35 AM  Radiology DG Chest [facility] 1 [address]  Result Date: 09/10/2022 CLINICAL DATA:  Shortness of breath and cough EXAM: PORTABLE CHEST 1 VIEW COMPARISON:  08/26/2022 FINDINGS: The cardiomediastinal silhouette is grossly unchanged. RIGHT UPPER lobe airspace disease/consolidation appears slightly improved. The LEFT lung is clear. There is no evidence of pneumothorax, large pleural effusion or acute bony abnormality. IMPRESSION: Slightly improved RIGHT UPPER lobe airspace disease/consolidation, likely representing pneumonia. Continued follow-up to resolution recommended. Electronically Signed   By: Margarette Canada M.D.   On: 09/10/2022 08:53    Procedures Procedures    Medications Ordered in ED Medications  albuterol (PROVENTIL) (2.5 MG/3ML) 0.083% nebulizer solution 5 mg (5 mg Nebulization Given 09/10/22 0840)    ED Course/ Medical Decision Making/ A&P Clinical Course as of 09/10/22 1313  Sat Sep 10, 2022  1312 Per [place], NP with psychiatry, patient is psychiatrically cleared. Will discharge  [LA]    Clinical Course User Index [LA] Mickie Hillier, PA-C                             Medical Decision Making Amount and/or Complexity of Data Reviewed Labs: ordered. Radiology: ordered.  Risk Prescription drug management.  Initial Impression and Ddx 62 year old, chronically ill appearing male presents to ED with cough, SI. He is tearful on exam and restless. He has productive cough which he states has been ongoing for months. He denies EtOH withdrawal symptoms.  Does not appear to be tremulous.  Patient PMH that increases complexity of ED encounter:  alcohol and cocaine abuse, cirrhosis, bipolar, t2dm, paf, hypertension Differential: SI - psychiatric related vs organic. Cough - chronic but new viral infection, new bacterial pneumonia, communicable disease like TB, cancer, medication side effect, etc.   Interpretation of Diagnostics I independent reviewed and interpreted the labs as followed: pancytopenia wbc 3.3, hgb 10.5, plt 108, cmp with no severe electrolyte dysfunction, ethanol/acetaminophen/salicylates negative, COVID/flu/RSV negative   - I independently visualized the following imaging with scope of interpretation limited to determining acute life threatening conditions related to emergency care: CXR, which revealed improving RUL cavitary lesion   Patient Reassessment and Ultimate Disposition/Management Patient with improvement in wheezing after treatment.  Labs as above. CXR with improving RUL consolidation although he has ongoing chronic cough. There is no fever concerning for a new developing pneumonia and will not treat at this time. COVID/flu/rsv negative. He does have a new pancytopenia. Unclear etiology but do not feel he requires further work up of this at this time. Will need repeated labs.  Tox screen is negative. He does have EtOH use without withdrawal symptoms and no other co-ingestions. Took 3 seroquel last night and is currently alert and oriented. Does not need poison control consult at this time.   He is medically cleared for psychiatry/TTS consult. Appreciate their recommendations.   Patient management required discussion with the following services or consulting groups:  Psychiatry/TTS  Complexity of Problems Addressed Chronic illness with exacerbation  Additional Data Reviewed and Analyzed Further history obtained from: Past medical history and medications listed in the EMR, Prior ED visit notes, Recent discharge summary,  Care Everywhere, and Prior labs/imaging results  Patient Encounter Risk Assessment Prescriptions, SDOH impact on management, and Consideration of hospitalization  Final Clinical Impression(s) / ED Diagnoses Final diagnoses:  None    Rx / DC Orders ED Discharge Orders     None         Mickie Hillier, PA-C 09/10/22 1036    Valarie Merino, MD 09/10/22  1202  

## 2022-09-10 NOTE — Progress Notes (Signed)
CSW received a message from RN requesting taxi voucher for pt to return to [facility]. CSW informed RN vouchers should be at nurse secretary's desk and she will need to call [employer] . TOC sign off.  Arlie [email or web address].[email or web address] Okray, MSW, [place]  Transitions of Care Clinical Social Worker I Direct Dial: [phone]  Fax: [phone] [email or web address]

## 2022-09-10 NOTE — ED Triage Notes (Signed)
[facility] reports pt coming from homeless shelter for malaise , cough, bilateral flank pain. Pt was incontinent. Pt also stating SI/HI with plan, states he will load his gun and shove it in his mouth, states he has felt this way for a few years.

## 2022-09-11 ENCOUNTER — Emergency Department (HOSPITAL_COMMUNITY): Payer: 59

## 2022-09-11 ENCOUNTER — Emergency Department (HOSPITAL_COMMUNITY)
Admission: EM | Admit: 2022-09-11 | Discharge: 2022-09-11 | Disposition: A | Discharge status: Home / Self Care | Payer: 59 | Attending: Emergency Medicine | Admitting: Emergency Medicine

## 2022-09-11 ENCOUNTER — Encounter (HOSPITAL_COMMUNITY): Payer: Self-pay

## 2022-09-11 ENCOUNTER — Other Ambulatory Visit: Payer: Self-pay

## 2022-09-11 DIAGNOSIS — J189 Pneumonia, unspecified organism: Secondary | ICD-10-CM

## 2022-09-11 DIAGNOSIS — I10 Essential (primary) hypertension: Secondary | ICD-10-CM | POA: Diagnosis not present

## 2022-09-11 DIAGNOSIS — Z7984 Long term (current) use of oral hypoglycemic drugs: Secondary | ICD-10-CM | POA: Diagnosis not present

## 2022-09-11 DIAGNOSIS — R059 Cough, unspecified: Secondary | ICD-10-CM | POA: Diagnosis present

## 2022-09-11 DIAGNOSIS — J168 Pneumonia due to other specified infectious organisms: Secondary | ICD-10-CM | POA: Insufficient documentation

## 2022-09-11 DIAGNOSIS — E119 Type 2 diabetes mellitus without complications: Secondary | ICD-10-CM | POA: Insufficient documentation

## 2022-09-11 DIAGNOSIS — R45851 Suicidal ideations: Secondary | ICD-10-CM | POA: Diagnosis not present

## 2022-09-11 LAB — COMPREHENSIVE METABOLIC PANEL
ALT: 22 U/L (ref 0–44)
AST: 31 U/L (ref 15–41)
Albumin: 2.3 g/dL — ABNORMAL LOW (ref 3.5–5.0)
Alkaline Phosphatase: 136 U/L — ABNORMAL HIGH (ref 38–126)
Anion gap: 9 (ref 5–15)
BUN: 8 mg/dL (ref 8–23)
CO2: 22 mmol/L (ref 22–32)
Calcium: 8.4 mg/dL — ABNORMAL LOW (ref 8.9–10.3)
Chloride: 105 mmol/L (ref 98–111)
Creatinine, Ser: 0.69 mg/dL (ref 0.61–1.24)
GFR, Estimated: >60 mL/min (ref >60)
Glucose, Bld: 155 mg/dL — ABNORMAL HIGH (ref 70–99)
Potassium: 3.3 mmol/L — ABNORMAL LOW (ref 3.5–5.1)
Sodium: 136 mmol/L (ref 135–145)
Total Bilirubin: 1.5 mg/dL — ABNORMAL HIGH (ref 0.3–1.2)
Total Protein: 8.2 g/dL — ABNORMAL HIGH (ref 6.5–8.1)

## 2022-09-11 LAB — CBC
HCT: 36.2 % — ABNORMAL LOW (ref 39.0–52.0)
Hemoglobin: 11.7 g/dL — ABNORMAL LOW (ref 13.0–17.0)
MCH: 33.4 pg (ref 26.0–34.0)
MCHC: 32.3 g/dL (ref 30.0–36.0)
MCV: 103.4 fL — ABNORMAL HIGH (ref 80.0–100.0)
Platelets: 140 10*3/uL — ABNORMAL LOW (ref 150–400)
RBC: 3.5 MIL/uL — ABNORMAL LOW (ref 4.22–5.81)
RDW: 15.8 % — ABNORMAL HIGH (ref 11.5–15.5)
WBC: 4.6 10*3/uL (ref 4.0–10.5)
nRBC: 0 % (ref 0.0–0.2)

## 2022-09-11 LAB — TROPONIN I (HIGH SENSITIVITY)
Troponin I (High Sensitivity): 6 ng/L (ref <18)
Troponin I (High Sensitivity): 6 ng/L (ref <18)

## 2022-09-11 MED ORDER — AMOXICILLIN-POT CLAVULANATE 875-125 MG PO TABS
1.0000 | ORAL_TABLET | Freq: Two times a day (BID) | ORAL | 0 refills | Status: DC
  Filled 2022-09-11: qty 14, 7d supply, fill #0

## 2022-09-11 MED ORDER — BENZONATATE 100 MG PO CAPS
100.0000 mg | ORAL_CAPSULE | Freq: Three times a day (TID) | ORAL | 0 refills | Status: DC
  Filled 2022-09-11: qty 21, 7d supply, fill #0

## 2022-09-11 MED ORDER — AMOXICILLIN-POT CLAVULANATE 875-125 MG PO TABS
1.0000 | ORAL_TABLET | Freq: Once | ORAL | Status: AC
  Administered 2022-09-11: 1 via ORAL
  Filled 2022-09-11: qty 1

## 2022-09-11 NOTE — ED Triage Notes (Signed)
PT BIB [facility] from the [facility] c/o a cough that has been ongoing x5 months. Pt was seen at [facility] for the same thing yesterday.

## 2022-09-11 NOTE — Progress Notes (Signed)
TOC CSW provided pt with resources for shelters and DSS for long term assistance.   Amana Bouska Tarpley-Carter, MSW, LCSW-A Pronouns:  She/Her/Hers [facility] HealthTransitions of Care Clinical Social Worker Direct Number:  [phone] [email or web address]

## 2022-09-11 NOTE — Discharge Instructions (Addendum)
Take the antibiotics as prescribed.  Follow-up with a primary doctor to be rechecked to make sure the infection clears.  The pneumonia that you were diagnosed with when you are in the hospital still has not completely resolved.

## 2022-09-11 NOTE — ED Provider Notes (Signed)
[facility] Provider Note   CSN: [id number] Arrival date & time: 09/11/22  1411     History  Chief Complaint  Patient presents with   Cough    Cory Palmer is a 62 y.o. male.   Cough    Patient has a history of diabetes, bipolar disorder, hypertension, osteomyelitis, paroxysmal A-fib, liver cirrhosis.  Patient presents to the ED for evaluation of a persistent cough, shortness of breath and chest discomfort.  Patient states it has been ongoing for at least 5 months.  Patient was admitted to the hospital in January 25 of this year.  Per the notes patient reportedly left AMA.  During that hospitalization patient was diagnosed with a right upper lobe pneumonia.  He had complete opacification of the right upper lobe.  Recommendation was for follow-up chest CT.  Patient did have evidence of cavitary lesions on his chest x-ray.  Patient was also diagnosed with COVID on January 25.  Patient returned to the emergency room yesterday.  He was evaluated at another ED.  Patient was also having suicidal ideations.  Patient was ultimately medically cleared and discharged.  Patient returns today because he continues to have severe cough  Home Medications Prior to Admission medications   Medication Sig Start Date End Date Taking? Authorizing Provider  amoxicillin-clavulanate (AUGMENTIN) 875-125 MG tablet Take 1 tablet by mouth every 12 (twelve) hours. 09/11/22  Yes Dorie Rank, MD  benzonatate (TESSALON) 100 MG capsule Take 1 capsule (100 mg total) by mouth every 8 (eight) hours. 09/11/22  Yes Dorie Rank, MD  metFORMIN (GLUCOPHAGE) 500 MG tablet Take 1 tablet (500 mg total) by mouth 2 (two) times daily with a meal. 08/30/22   Geradine Girt, DO      Allergies    Atorvastatin calcium, Gemfibrozil, and Insulin glargine    Review of Systems   Review of Systems  Respiratory:  Positive for cough.     Physical Exam Updated Vital Signs BP 110/65   Pulse 87    Temp 98.2 F (36.8 C) (Oral)   Resp 18   SpO2 98%  Physical Exam Vitals and nursing note reviewed.  Constitutional:      Appearance: He is well-developed. He is not diaphoretic.  HENT:     Head: Normocephalic and atraumatic.     Right Ear: External ear normal.     Left Ear: External ear normal.  Eyes:     General: No scleral icterus.       Right eye: No discharge.        Left eye: No discharge.     Conjunctiva/sclera: Conjunctivae normal.  Neck:     Trachea: No tracheal deviation.  Cardiovascular:     Rate and Rhythm: Normal rate and regular rhythm.  Pulmonary:     Effort: Pulmonary effort is normal. No respiratory distress.     Breath sounds: Normal breath sounds. No stridor. No wheezing or rales.     Comments: Frequent coughing Abdominal:     General: Bowel sounds are normal. There is no distension.     Palpations: Abdomen is soft.     Tenderness: There is no abdominal tenderness. There is no guarding or rebound.  Musculoskeletal:        General: No tenderness or deformity.     Cervical back: Neck supple.     Comments: Status post bilateral BKA, no edema  Skin:    General: Skin is warm and dry.  Findings: No rash.  Neurological:     General: No focal deficit present.     Mental Status: He is alert.     Cranial Nerves: No cranial nerve deficit, dysarthria or facial asymmetry.     Sensory: No sensory deficit.     Motor: No abnormal muscle tone or seizure activity.     Coordination: Coordination normal.  Psychiatric:        Mood and Affect: Mood normal.     ED Results / Procedures / Treatments   Labs (all labs ordered are listed, but only abnormal results are displayed) Labs Reviewed  CBC - Abnormal; Notable for the following components:      Result Value   RBC 3.50 (*)    Hemoglobin 11.7 (*)    HCT 36.2 (*)    MCV 103.4 (*)    RDW 15.8 (*)    Platelets 140 (*)    All other components within normal limits  COMPREHENSIVE METABOLIC PANEL - Abnormal;  Notable for the following components:   Potassium 3.3 (*)    Glucose, Bld 155 (*)    Calcium 8.4 (*)    Total Protein 8.2 (*)    Albumin 2.3 (*)    Alkaline Phosphatase 136 (*)    Total Bilirubin 1.5 (*)    All other components within normal limits  TROPONIN I (HIGH SENSITIVITY)  TROPONIN I (HIGH SENSITIVITY)    EKG EKG Interpretation  Date/Time:  Sunday September 11 2022 14:32:03 EST Ventricular Rate:  93 PR Interval:    QRS Duration: 86 QT Interval:  410 QTC Calculation: 509 R Axis:   41 Text Interpretation: Atrial fibrillation Septal infarct , age undetermined Prolonged QT Abnormal ECG When compared with ECG of 10-Sep-2022 08:17, No significant change since last tracing Confirmed by Dorie Rank [phone]) on 09/11/2022 3:22:59 PM  Radiology DG Chest 2 View  Result Date: 09/11/2022 CLINICAL DATA:  Shortness of breath EXAM: CHEST - 2 VIEW COMPARISON:  09/10/2022, 08/27/2022 FINDINGS: The heart size and mediastinal contours are within normal limits. No significant change of extensive airspace consolidation within the right upper lobe. Left lung remains clear. No pleural effusion or pneumothorax. The visualized skeletal structures are unremarkable. IMPRESSION: No significant change of extensive airspace consolidation within the right upper lobe. Follow-up to resolution is recommended. Electronically Signed   By: Davina Poke D.O.   On: 09/11/2022 15:28   DG Chest Port 1 View  Result Date: 09/10/2022 CLINICAL DATA:  Shortness of breath and cough EXAM: PORTABLE CHEST 1 VIEW COMPARISON:  08/26/2022 FINDINGS: The cardiomediastinal silhouette is grossly unchanged. RIGHT UPPER lobe airspace disease/consolidation appears slightly improved. The LEFT lung is clear. There is no evidence of pneumothorax, large pleural effusion or acute bony abnormality. IMPRESSION: Slightly improved RIGHT UPPER lobe airspace disease/consolidation, likely representing pneumonia. Continued follow-up to resolution  recommended. Electronically Signed   By: Margarette Canada M.D.   On: 09/10/2022 08:53    Procedures Procedures    Medications Ordered in ED Medications  amoxicillin-clavulanate (AUGMENTIN) 875-125 MG per tablet 1 tablet (has no administration in time range)    ED Course/ Medical Decision Making/ A&P Clinical Course as of 09/11/22 1846  Sun Sep 11, 2022  1749 CBC(!) CBC shows stable hemoglobin [JK]  1750 Comprehensive metabolic panel(!) Metabolic panel normal [JK]  1750 Chest x-ray without any change in his extensive airspace disease in the right upper lobe. [JK]    Clinical Course User Index [JK] Dorie Rank, MD  Medical Decision Making Problems Addressed: Pneumonia of right upper lobe due to infectious organism: chronic illness or injury with exacerbation, progression, or side effects of treatment  Amount and/or Complexity of Data Reviewed Labs: ordered. Decision-making details documented in ED Course. Radiology: ordered and independent interpretation performed.  Risk Prescription drug management.   Patient was recently in the hospital for a pneumonia.  Patient knitted the end of January.  He ended up leaving before completing his evaluation.  Patient noted to have significant upper lobe infiltrate.  She did have a CT scan and there was some evidence of cavitary lesions no definite mass noted.  Recommendation was to follow-up with CT scan in 3 to 4 weeks.  Patient's x-ray shows the persistent infiltrate but he does not have an oxygen requirement here.  He does not have any breathing difficulty.  Laboratory tests are stable.  He does not have a leukocytosis.  His hemoglobin and platelets have improved.  No significant electrolyte normalities.  Serial troponins normal.  Will discharge home with prescription for Augmentin.  Recommend outpatient follow-up to make sure his infection completely clears        Final Clinical Impression(s) / ED  Diagnoses Final diagnoses:  Pneumonia of right upper lobe due to infectious organism    Rx / DC Orders ED Discharge Orders          Ordered    amoxicillin-clavulanate (AUGMENTIN) 875-125 MG tablet  Every 12 hours        09/11/22 1814    benzonatate (TESSALON) 100 MG capsule  Every 8 hours        09/11/22 1814              Dorie Rank, MD 09/11/22 1846

## 2022-09-11 NOTE — ED Notes (Signed)
Attempted blood draw 3 times with no success.

## 2022-09-11 NOTE — ED Provider Triage Note (Signed)
Emergency Medicine Provider Triage Evaluation Note  Cory Palmer , a 62 y.o. male  was evaluated in triage.  Pt complains of shortness of breath, chest tightness, and cough that is present persistent for the last 5 months, that he feels like is getting worse.  Denies any hemoptysis, leg swelling.  Denies any recent surgeries, no cancer..  Review of Systems  Positive: Chest tightness, SOB Negative: fever  Physical Exam  BP 121/71 (BP Location: Right Arm)   Pulse 85   Temp 98.2 F (36.8 C) (Oral)   Resp 20   SpO2 98%  Gen:   Awake, no distress   Resp:  Normal effort  MSK:   Moves extremities without difficulty  Other:    Medical Decision Making  Medically screening exam initiated at 2:16 PM.  Appropriate orders placed.  Cory Palmer was informed that the remainder of the evaluation will be completed by another provider, this initial triage assessment does not replace that evaluation, and the importance of remaining in the ED until their evaluation is complete.    Osvaldo Shipper, [place] 09/11/22 1417

## 2022-09-11 NOTE — ED Notes (Signed)
Patient provided snack and drink by this tech

## 2022-09-12 ENCOUNTER — Other Ambulatory Visit: Payer: Self-pay

## 2022-09-12 ENCOUNTER — Other Ambulatory Visit (HOSPITAL_COMMUNITY): Payer: Self-pay

## 2022-09-13 ENCOUNTER — Encounter (HOSPITAL_COMMUNITY): Payer: Self-pay | Admitting: Emergency Medicine

## 2022-09-13 ENCOUNTER — Emergency Department (HOSPITAL_COMMUNITY)
Admission: EM | Admit: 2022-09-13 | Discharge: 2022-09-13 | Disposition: A | Discharge status: Home / Self Care | Payer: 59 | Attending: Emergency Medicine | Admitting: Emergency Medicine

## 2022-09-13 ENCOUNTER — Other Ambulatory Visit: Payer: Self-pay

## 2022-09-13 DIAGNOSIS — I10 Essential (primary) hypertension: Secondary | ICD-10-CM | POA: Diagnosis not present

## 2022-09-13 DIAGNOSIS — E119 Type 2 diabetes mellitus without complications: Secondary | ICD-10-CM | POA: Diagnosis not present

## 2022-09-13 DIAGNOSIS — Z59819 Housing instability, housed unspecified: Secondary | ICD-10-CM

## 2022-09-13 DIAGNOSIS — Z7984 Long term (current) use of oral hypoglycemic drugs: Secondary | ICD-10-CM | POA: Diagnosis not present

## 2022-09-13 DIAGNOSIS — Z79899 Other long term (current) drug therapy: Secondary | ICD-10-CM

## 2022-09-13 DIAGNOSIS — Z760 Encounter for issue of repeat prescription: Secondary | ICD-10-CM | POA: Insufficient documentation

## 2022-09-13 MED ORDER — BENZONATATE 100 MG PO CAPS
100.0000 mg | ORAL_CAPSULE | Freq: Once | ORAL | Status: AC
  Administered 2022-09-13: 100 mg via ORAL
  Filled 2022-09-13: qty 1

## 2022-09-13 MED ORDER — AMOXICILLIN-POT CLAVULANATE 875-125 MG PO TABS
1.0000 | ORAL_TABLET | Freq: Two times a day (BID) | ORAL | 0 refills | Status: DC
  Filled 2022-09-13: qty 14, 7d supply, fill #0

## 2022-09-13 MED ORDER — AMOXICILLIN-POT CLAVULANATE 875-125 MG PO TABS
1.0000 | ORAL_TABLET | Freq: Once | ORAL | Status: AC
  Administered 2022-09-13: 1 via ORAL
  Filled 2022-09-13: qty 1

## 2022-09-13 MED ORDER — BENZONATATE 100 MG PO CAPS
100.0000 mg | ORAL_CAPSULE | Freq: Three times a day (TID) | ORAL | 0 refills | Status: DC | PRN
  Filled 2022-09-13: qty 21, 7d supply, fill #0

## 2022-09-13 NOTE — Discharge Instructions (Signed)
Please return to the ED with any new or worsening signs or symptoms Please pick up antibiotics and Tessalon Perles from [facility] in the morning utilizing bus passes provided to you Please await call from social work concerning assisted living

## 2022-09-13 NOTE — ED Notes (Signed)
Patient complaints that he loss his medication and needs refills. Patient request food, provided. He denies any other complaints.

## 2022-09-13 NOTE — ED Provider Triage Note (Signed)
Emergency Medicine Provider Triage Evaluation Note  Cory Palmer , a 62 y.o. male  was evaluated in triage.  Pt complains of needing medication refill.  After asking patient multiple times about which medications he needed refilled at this time, patient cannot verbalize to me which medications he is needing.  He does report that he is upset that he was seen at [facility] a few days ago and all his medications were thrown away.  I cannot see any medication outside prior history that he has been taking prescribed.  Review of Systems  Positive: As above Negative: As above  Physical Exam  BP 124/69 (BP Location: Left Arm)   Pulse 87   Temp 99.1 F (37.3 C) (Oral)   Resp 18   SpO2 99%  Gen:   Awake, no distress   Resp:  Normal effort  MSK:   Moves extremities without difficulty, bilateral below the knee amputations Other:    Medical Decision Making  Medically screening exam initiated at 3:09 PM.  Appropriate orders placed.  Cory Palmer was informed that the remainder of the evaluation will be completed by another provider, this initial triage assessment does not replace that evaluation, and the importance of remaining in the ED until their evaluation is complete.     Luvenia Heller, PA-C 09/13/22 1510

## 2022-09-13 NOTE — Care Management (Signed)
Received TOC consult met with patient in [facility]16 concerning medication assistance and  chronic homelessness.  Patient was here 2 days ago unable to get his medications. Discussed that his prescriptions were sent to [facility] but we can resend the scripts to the [facility], and will provide transportation for him to come back to pem up. Patient is agreeable. Patient tells me he is living on the street and has been trying to get a  place to live his is a BKA in w/c.  Patient is now wanting ALF, he states he has been trying to meet with SW at the [facility] but has not been able to speak with her.  ED CM offered to contact a Community SW on his behalf. Patient is agreeable. ED RNCM will reach out to the community SW concerning housing.

## 2022-09-13 NOTE — ED Provider Notes (Signed)
[facility] Provider Note   CSN: [id number] Arrival date & time: 09/13/22  1431     History  Chief Complaint  Patient presents with   Medication Refill    Cory Palmer is a 62 y.o. male with medical history of bilateral lower extremity potation's, diabetes, hypertension, liver cirrhosis, PAF, anxiety.  Patient presents to ED for evaluation/assistance with medication management.  Patient reports he was seen here yesterday for cough, provided Tessalon Perles and Augmentin.  Patient reports he is unable to afford these medications, he is unsure where they were sent.  Patient also here requesting resources, to talk to social worker regarding assisted living facility.  Patient reports that he has been living on the street for too long, he is tired of being out in the cold.  Patient denies medical complaints.   Medication Refill      Home Medications Prior to Admission medications   Medication Sig Start Date End Date Taking? Authorizing Provider  amoxicillin-clavulanate (AUGMENTIN) 875-125 MG tablet Take 1 tablet by mouth every 12 (twelve) hours. 09/13/22  Yes Azucena Cecil, PA-C  benzonatate (TESSALON) 100 MG capsule Take 1 capsule (100 mg total) by mouth every 8 (eight) hours as needed for cough. 09/13/22  Yes Azucena Cecil, PA-C  metFORMIN (GLUCOPHAGE) 500 MG tablet Take 1 tablet (500 mg total) by mouth 2 (two) times daily with a meal. 08/30/22   Geradine Girt, DO      Allergies    Atorvastatin calcium, Gemfibrozil, and Insulin glargine    Review of Systems   Review of Systems  All other systems reviewed and are negative.   Physical Exam Updated Vital Signs BP (!) 151/108   Pulse 96   Temp 98.4 F (36.9 C)   Resp 18   SpO2 99%  Physical Exam Vitals and nursing note reviewed.  Constitutional:      General: He is not in acute distress.    Appearance: He is well-developed.  HENT:     Head: Normocephalic and  atraumatic.  Eyes:     Conjunctiva/sclera: Conjunctivae normal.  Cardiovascular:     Rate and Rhythm: Normal rate and regular rhythm.     Heart sounds: No murmur heard. Pulmonary:     Effort: Pulmonary effort is normal. No respiratory distress.     Breath sounds: Normal breath sounds.  Abdominal:     Palpations: Abdomen is soft.     Tenderness: There is no abdominal tenderness.  Musculoskeletal:        General: No swelling.     Cervical back: Neck supple.     Right Lower Extremity: Right leg is amputated below knee.     Left Lower Extremity: Left leg is amputated below knee.  Skin:    General: Skin is warm and dry.     Capillary Refill: Capillary refill takes less than 2 seconds.  Neurological:     Mental Status: He is alert.  Psychiatric:        Mood and Affect: Mood normal.     ED Results / Procedures / Treatments   Labs (all labs ordered are listed, but only abnormal results are displayed) Labs Reviewed - No data to display  EKG None  Radiology No results found.  Procedures Procedures   Medications Ordered in ED Medications  benzonatate (TESSALON) capsule 100 mg (100 mg Oral Given 09/13/22 2104)  amoxicillin-clavulanate (AUGMENTIN) 875-125 MG per tablet 1 tablet (1 tablet Oral Given 09/13/22 2104)  ED Course/ Medical Decision Making/ A&P  Medical Decision Making Risk Prescription drug management.   62 year old male presents to ED for evaluation.  Please see HPI for further details.  Patient here for medication management.  Patient reports being seen on 2/11 and discharged with antibiotics however states he is unable to locate where they were sent, unable to afford them.  Patient denies any medical complaints or concerns.  Patient is here requesting medication management assistance.  Patient also here requesting to talk to social worker concerning assisted living.  Patient reports he is currently having at [facility] however is sick of living without  housing.  Discussed patient case with Josie Dixon, RN who came to the patient bedside.  Landis Martins has provided the patient with bus passes and advised me to send the patient medication to the [facility] pharmacy here at [facility] where they would not charge him a co-pay.  Patient also discussed with Landis Martins his desire to be placed into assisted living, Landis Martins reports that she will be beginning the process tonight.  Patient provided first doses of Augmentin and Tessalon Perles here.  Patient provided two bag lunches.  Patient provided bus passes to get back to [place] and back to hospital tomorrow to pick up antibiotics and medication.  At this time, patient stable for discharge.  Patient amenable to plan.  Patient discharged with no concerns or questions.  Patient stable at this time for discharge home.   Final Clinical Impression(s) / ED Diagnoses Final diagnoses:  Medication management  Housing insecurity    Rx / DC Orders ED Discharge Orders          Ordered    amoxicillin-clavulanate (AUGMENTIN) 875-125 MG tablet  Every 12 hours        09/13/22 2134    benzonatate (TESSALON) 100 MG capsule  Every 8 hours PRN        09/13/22 2134              Azucena Cecil, PA-C 09/13/22 2140    Deno Etienne, DO 09/13/22 2239

## 2022-09-13 NOTE — ED Triage Notes (Signed)
Pt arrives via EMS from [facility]- is here frequently. No acute complaints. Pt wants to see social worker for issues with meds? BP [id number] systolic, HR 96, CBG 0000000

## 2022-09-14 ENCOUNTER — Other Ambulatory Visit (HOSPITAL_COMMUNITY): Payer: Self-pay

## 2022-10-07 ENCOUNTER — Emergency Department (HOSPITAL_COMMUNITY)
Admission: EM | Admit: 2022-10-07 | Discharge: 2022-10-07 | Discharge status: Against Medical Advice | Payer: 59 | Attending: Emergency Medicine | Admitting: Emergency Medicine

## 2022-10-07 ENCOUNTER — Encounter (HOSPITAL_COMMUNITY): Payer: Self-pay

## 2022-10-07 DIAGNOSIS — F1490 Cocaine use, unspecified, uncomplicated: Secondary | ICD-10-CM | POA: Insufficient documentation

## 2022-10-07 DIAGNOSIS — Y900 Blood alcohol level of less than 20 mg/100 ml: Secondary | ICD-10-CM | POA: Diagnosis not present

## 2022-10-07 DIAGNOSIS — Z5321 Procedure and treatment not carried out due to patient leaving prior to being seen by health care provider: Secondary | ICD-10-CM | POA: Insufficient documentation

## 2022-10-07 DIAGNOSIS — F1090 Alcohol use, unspecified, uncomplicated: Secondary | ICD-10-CM | POA: Insufficient documentation

## 2022-10-07 NOTE — ED Notes (Signed)
Pt wheeling self to lobby, states he's tired of being here and he is leaving; this RN encouraged pt to stay; pt continued wheeling self to lobby

## 2022-10-07 NOTE — ED Triage Notes (Addendum)
Pt bib [employer]; called out for SI , plan to use gun on self; voluntary at this time; [employer] attempted to take ot [facility], [place] refused pt due to being in wheelchair, double amputee; pt endorses crack cocaine and alcohol use; pt says he sees spirits that are trying to kill him;   pt currently denying SI,states he needs help with housing; denies A?V hallucinations; pt a and o x 4

## 2022-10-08 ENCOUNTER — Emergency Department (HOSPITAL_COMMUNITY): Admission: EM | Admit: 2022-10-08 | Discharge: 2022-10-08 | Discharge status: Against Medical Advice | Payer: 59

## 2022-10-10 ENCOUNTER — Emergency Department (HOSPITAL_COMMUNITY)
Admission: EM | Admit: 2022-10-10 | Discharge: 2022-10-10 | Disposition: A | Discharge status: Home / Self Care | Payer: 59 | Attending: Emergency Medicine | Admitting: Emergency Medicine

## 2022-10-10 ENCOUNTER — Other Ambulatory Visit (HOSPITAL_COMMUNITY): Payer: Self-pay

## 2022-10-10 ENCOUNTER — Other Ambulatory Visit: Payer: Self-pay

## 2022-10-10 DIAGNOSIS — T8789 Other complications of amputation stump: Secondary | ICD-10-CM | POA: Insufficient documentation

## 2022-10-10 DIAGNOSIS — E119 Type 2 diabetes mellitus without complications: Secondary | ICD-10-CM | POA: Insufficient documentation

## 2022-10-10 DIAGNOSIS — G546 Phantom limb syndrome with pain: Secondary | ICD-10-CM | POA: Insufficient documentation

## 2022-10-10 DIAGNOSIS — I10 Essential (primary) hypertension: Secondary | ICD-10-CM | POA: Insufficient documentation

## 2022-10-10 DIAGNOSIS — Y792 Prosthetic and other implants, materials and accessory orthopedic devices associated with adverse incidents: Secondary | ICD-10-CM | POA: Diagnosis not present

## 2022-10-10 LAB — CBG MONITORING, ED: Glucose-Capillary: 172 mg/dL — ABNORMAL HIGH (ref 70–99)

## 2022-10-10 MED ORDER — GABAPENTIN 300 MG PO CAPS
300.0000 mg | ORAL_CAPSULE | Freq: Three times a day (TID) | ORAL | 0 refills | Status: DC
  Filled 2022-10-10: qty 90, 30d supply, fill #0

## 2022-10-10 NOTE — ED Triage Notes (Signed)
Pt here with c/o  bil leg pain form bka ,, pt is in a w/c and has not been fitted with prosthesis

## 2022-10-10 NOTE — ED Notes (Signed)
Lunch tray ordered 

## 2022-10-10 NOTE — Progress Notes (Signed)
CSW was not able to get a hold of patients brother. Patient stated he has some friends who stated he could stay with them in their hotel room. Patient stated he would like to go to [employer] first before he goes to their hotel. CSW provided patient with the address and phone number to [facility] who take medicaid for assisted living. Patient stated he has medicaid. Patient stated he will use the phone at the [facility] to contact them. Patient given cab voucher and bus passes.

## 2022-10-10 NOTE — ED Provider Notes (Signed)
[facility] Provider Note   CSN: [id number] Arrival date & time: 10/10/22  [id number]     History  No chief complaint on file.   Cory Palmer is a 62 y.o. male.  HPI Patient presents with pain at his BKA and below.  History of bilateral below the knee amputations bilaterally.  Also reported history of phantom pain.  States pain is worse.  Also pain on the stump.  States he is post to have prosthetics that have been ordered but he has not been able to get due to transportation issues.  He states that the Lucianne Lei that was post to get him never showed up.   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 Authorizing Provider  gabapentin (NEURONTIN) 300 MG capsule Take 1 capsule (300 mg total) by mouth 3 (three) times daily. 10/10/22  Yes Davonna Belling, MD  amoxicillin-clavulanate (AUGMENTIN) 875-125 MG tablet Take 1 tablet by mouth every 12 (twelve) hours. 09/13/22   Azucena Cecil, PA-C  benzonatate (TESSALON) 100 MG capsule Take 1 capsule (100 mg total) by mouth every 8 (eight) hours as needed for cough. 09/13/22   Azucena Cecil, PA-C  metFORMIN (GLUCOPHAGE) 500 MG tablet Take 1 tablet (500 mg total) by mouth 2 (two) times daily with a meal. 08/30/22   Geradine Girt, DO      Allergies    Atorvastatin calcium, Gemfibrozil, and Insulin glargine    Review of Systems   Review of Systems  Physical Exam Updated Vital Signs BP (!) 139/93 (BP Location: Right Arm)   Pulse (!) 109   Temp 97.6 F (36.4 C) (Oral)    Resp 19   SpO2 98%  Physical Exam Vitals and nursing note reviewed.  Cardiovascular:     Rate and Rhythm: Regular rhythm.  Pulmonary:     Breath sounds: No wheezing or rhonchi.  Abdominal:     General: There is no distension.     Tenderness: There is no abdominal tenderness.  Musculoskeletal:     Comments: Bilateral below the knee amputations.  Mild tenderness at base of the left stump.  Slight abrasion with scab.  No erythema.  Neurological:     Mental Status: He is alert.     ED Results / Procedures / Treatments   Labs (all labs ordered are listed, but only abnormal results are displayed) Labs Reviewed  CBG MONITORING, ED - Abnormal; Notable for the following components:      Result Value   Glucose-Capillary 172 (*)    All other components within normal limits    EKG None  Radiology No results found.  Procedures Procedures    Medications Ordered in ED Medications - No data to display  ED Course/ Medical Decision Making/ A&P                             Medical Decision Making Risk Prescription drug management.   Patient with pain on  bilateral stumps and states phantom pain.  States has had this for 11 years.  Will try some Neurontin to see if it will help.  CBG only mildly elevated.  Transition of care had been consulted about helping to arrange transportation.  However at this point patient is appears stable for discharge home.  Outpatient follow-up with PCP.        Final Clinical Impression(s) / ED Diagnoses Final diagnoses:  Phantom pain ([place])    Rx / DC Orders ED Discharge Orders          Ordered    gabapentin (NEURONTIN) 300 MG capsule  3 times daily        10/10/22 1417              Davonna Belling, MD 10/10/22 1427

## 2022-10-10 NOTE — Progress Notes (Signed)
Patient stated he can transfer in and out of a cab. CSW contacted patients brother Daesean Schley, [phone] to see if he could assist patient with a hotel room.

## 2022-10-18 ENCOUNTER — Other Ambulatory Visit (HOSPITAL_COMMUNITY): Payer: Self-pay

## 2022-10-29 ENCOUNTER — Other Ambulatory Visit: Payer: Self-pay

## 2022-10-29 ENCOUNTER — Encounter (HOSPITAL_COMMUNITY): Payer: Self-pay

## 2022-10-29 ENCOUNTER — Emergency Department (HOSPITAL_COMMUNITY)
Admission: EM | Admit: 2022-10-29 | Discharge: 2022-10-29 | Disposition: A | Discharge status: Home / Self Care | Payer: 59 | Attending: Student | Admitting: Student

## 2022-10-29 DIAGNOSIS — Z5900 Homelessness unspecified: Secondary | ICD-10-CM | POA: Diagnosis not present

## 2022-10-29 DIAGNOSIS — R5383 Other fatigue: Secondary | ICD-10-CM | POA: Insufficient documentation

## 2022-10-29 DIAGNOSIS — M2550 Pain in unspecified joint: Secondary | ICD-10-CM

## 2022-10-29 DIAGNOSIS — Z20822 Contact with and (suspected) exposure to covid-19: Secondary | ICD-10-CM | POA: Diagnosis not present

## 2022-10-29 DIAGNOSIS — R531 Weakness: Secondary | ICD-10-CM | POA: Diagnosis present

## 2022-10-29 DIAGNOSIS — Z7984 Long term (current) use of oral hypoglycemic drugs: Secondary | ICD-10-CM | POA: Diagnosis not present

## 2022-10-29 DIAGNOSIS — R52 Pain, unspecified: Secondary | ICD-10-CM | POA: Insufficient documentation

## 2022-10-29 DIAGNOSIS — E778 Other disorders of glycoprotein metabolism: Secondary | ICD-10-CM | POA: Diagnosis not present

## 2022-10-29 DIAGNOSIS — R7309 Other abnormal glucose: Secondary | ICD-10-CM | POA: Diagnosis not present

## 2022-10-29 DIAGNOSIS — R5381 Other malaise: Secondary | ICD-10-CM

## 2022-10-29 LAB — RESP PANEL BY RT-PCR (RSV, FLU A&B, COVID)  RVPGX2
Influenza A by PCR: NEGATIVE
Influenza B by PCR: NEGATIVE
Resp Syncytial Virus by PCR: NEGATIVE
SARS Coronavirus 2 by RT PCR: NEGATIVE

## 2022-10-29 LAB — CBC WITH DIFFERENTIAL/PLATELET
Abs Immature Granulocytes: 0.01 10*3/uL (ref 0.00–0.07)
Basophils Absolute: 0 10*3/uL (ref 0.0–0.1)
Basophils Relative: 1 %
Eosinophils Absolute: 0.2 10*3/uL (ref 0.0–0.5)
Eosinophils Relative: 2 %
HCT: 33.9 % — ABNORMAL LOW (ref 39.0–52.0)
Hemoglobin: 11.3 g/dL — ABNORMAL LOW (ref 13.0–17.0)
Immature Granulocytes: 0 %
Lymphocytes Relative: 19 %
Lymphs Abs: 1.2 10*3/uL (ref 0.7–4.0)
MCH: 33.8 pg (ref 26.0–34.0)
MCHC: 33.3 g/dL (ref 30.0–36.0)
MCV: 101.5 fL — ABNORMAL HIGH (ref 80.0–100.0)
Monocytes Absolute: 0.7 10*3/uL (ref 0.1–1.0)
Monocytes Relative: 11 %
Neutro Abs: 4.4 10*3/uL (ref 1.7–7.7)
Neutrophils Relative %: 67 %
Platelets: 106 10*3/uL — ABNORMAL LOW (ref 150–400)
RBC: 3.34 MIL/uL — ABNORMAL LOW (ref 4.22–5.81)
RDW: 13.2 % (ref 11.5–15.5)
WBC: 6.6 10*3/uL (ref 4.0–10.5)
nRBC: 0 % (ref 0.0–0.2)

## 2022-10-29 LAB — COMPREHENSIVE METABOLIC PANEL
ALT: 14 U/L (ref 0–44)
AST: 22 U/L (ref 15–41)
Albumin: 3.3 g/dL — ABNORMAL LOW (ref 3.5–5.0)
Alkaline Phosphatase: 94 U/L (ref 38–126)
Anion gap: 8 (ref 5–15)
BUN: 19 mg/dL (ref 8–23)
CO2: 22 mmol/L (ref 22–32)
Calcium: 8.9 mg/dL (ref 8.9–10.3)
Chloride: 105 mmol/L (ref 98–111)
Creatinine, Ser: 1.21 mg/dL (ref 0.61–1.24)
GFR, Estimated: >60 mL/min (ref >60)
Glucose, Bld: 145 mg/dL — ABNORMAL HIGH (ref 70–99)
Potassium: 3.7 mmol/L (ref 3.5–5.1)
Sodium: 135 mmol/L (ref 135–145)
Total Bilirubin: 1.5 mg/dL — ABNORMAL HIGH (ref 0.3–1.2)
Total Protein: 6.4 g/dL — ABNORMAL LOW (ref 6.5–8.1)

## 2022-10-29 NOTE — ED Notes (Signed)
Pt attempting to give urine sample, keeps falling asleep while trying to do so

## 2022-10-29 NOTE — Discharge Instructions (Addendum)
Get help right away if: You feel confused, feel like you might faint, or faint. Your vision is blurry or you have a severe headache. You have severe pain in your abdomen, your back, or the area between your waist and hips (pelvis). You have chest pain, shortness of breath, or an irregular or fast heartbeat. You are unable to urinate, or you urinate less than normal. You have abnormal bleeding from the rectum, nose, lungs, nipples, or, if you are male, the vagina. You vomit blood.

## 2022-10-29 NOTE — ED Triage Notes (Signed)
BIBA for generalized body pain in arms, and legs. Pt has bilateral BKA, is homeless, arrives with his wheelchair-states that he needs a new wheelchair, and is interested in finding SNF placement. 134/78 BP 108 HR 95% room air 204 cbg

## 2022-10-29 NOTE — ED Notes (Signed)
Staff unable to get vitals before the patient left.

## 2022-10-29 NOTE — ED Provider Notes (Signed)
[place] AT [facility] Provider Note   CSN: [id number] Arrival date & time: 10/29/22  1622     History  Chief Complaint  Patient presents with   Generalized Body Aches    Cory Palmer is a 62 y.o. male presents emergency department chief complaint of Generalized bodyaches and weakness.  Patient has a past medical history of unsheltered homelessness, bilateral below-knee amputations, chronic phantom limb pain, history of cocaine abuse and mood disorder, health history of alcohol induced mood disorder and abuse.  Patient states that he has been extremely fatigued over the last [redacted] weeks along with generalized aching in all of his joints.  He is also sick of living on the streets and would like to be placed at a skilled nursing facility.  I advised the patient that this would be impossible on the Saturday before Easter however we will be very happy to work him up for any medical emergencies.  He denies any confusion HPI     Home Medications Prior to Admission medications   Medication Sig Start Date End Date Taking? Authorizing Provider  amoxicillin-clavulanate (AUGMENTIN) 875-125 MG tablet Take 1 tablet by mouth every 12 (twelve) hours. 09/13/22   Azucena Cecil, PA-C  benzonatate (TESSALON) 100 MG capsule Take 1 capsule (100 mg total) by mouth every 8 (eight) hours as needed for cough. 09/13/22   Azucena Cecil, PA-C  gabapentin (NEURONTIN) 300 MG capsule Take 1 capsule (300 mg) by mouth 3 (three) times daily. 10/10/22   Davonna Belling, MD  metFORMIN (GLUCOPHAGE) 500 MG tablet Take 1 tablet (500 mg total) by mouth 2 (two) times daily with a meal. 08/30/22   Geradine Girt, DO      Allergies    Atorvastatin calcium, Gemfibrozil, and Insulin glargine    Review of Systems   Review of Systems  Physical Exam Updated Vital Signs BP 120/62 (BP Location: Left Arm)   Pulse 89   Temp 99.1 F (37.3 C) (Oral)   Resp 20   SpO2 94%  Physical  Exam Vitals and nursing note reviewed.  Constitutional:      General: He is not in acute distress.    Appearance: He is well-developed. He is not diaphoretic.  HENT:     Head: Normocephalic and atraumatic.  Eyes:     General: No scleral icterus.    Conjunctiva/sclera: Conjunctivae normal.  Cardiovascular:     Rate and Rhythm: Normal rate and regular rhythm.     Heart sounds: Normal heart sounds.  Pulmonary:     Effort: Pulmonary effort is normal. No respiratory distress.     Breath sounds: Normal breath sounds.  Abdominal:     Palpations: Abdomen is soft.     Tenderness: There is no abdominal tenderness.  Musculoskeletal:     Cervical back: Normal range of motion and neck supple.     Comments: Bilateral legs with previous amputations, no evidence of infection  Skin:    General: Skin is warm and dry.  Neurological:     Mental Status: He is alert.  Psychiatric:        Behavior: Behavior normal.     ED Results / Procedures / Treatments   Labs (all labs ordered are listed, but only abnormal results are displayed) Labs Reviewed  COMPREHENSIVE METABOLIC PANEL - Abnormal; Notable for the following components:      Result Value   Glucose, Bld 145 (*)    Total Protein 6.4 (*)  Albumin 3.3 (*)    Total Bilirubin 1.5 (*)    All other components within normal limits  CBC WITH DIFFERENTIAL/PLATELET - Abnormal; Notable for the following components:   RBC 3.34 (*)    Hemoglobin 11.3 (*)    HCT 33.9 (*)    MCV 101.5 (*)    Platelets 106 (*)    All other components within normal limits  RESP PANEL BY RT-PCR (RSV, FLU A&B, COVID)  [id number]    EKG None  Radiology No results found.  Procedures Procedures    Medications Ordered in ED Medications - No data to display  ED Course/ Medical Decision Making/ A&P Clinical Course as of 10/29/22 2144  Sat Oct 29, 2022  1952 CBC with Differential(!) [AH]  1952 Hemoglobin(!): 11.3 [AH]  1952 MCV(!): 101.5 [AH]  1952  Comprehensive metabolic panel(!) [AH]  [id number] Glucose(!): 145 [AH]  1952 Albumin(!): 3.3 [AH]  1951 Total Protein(!): 6.4 [AH]  1951 Total Bilirubin(!): 1.5 [AH]  1952 Resp panel by RT-PCR (RSV, Flu A&B, Covid) Anterior Nasal Swab Labs appear reassuring, awaiting urinalysis [AH]    Clinical Course User Index [AH] Margarita Mail, PA-C                             Medical Decision Making 62 year old male who presents with generalized malaise and fatigue, known history of homelessness, bilateral below-knee amputations.  He is having some issues with his wheelchair not working well however it is still usable.  I have advised the patient that there is 0 way to get him a new wheelchair on a Saturday night on a holiday weekend. Patient also wanting skilled nursing facility basement and I discussed that that was not going to be a possibility tonight. Given his concern for malaise and fatigue I assessed the patient for any significant electrolyte abnormalities, infections.  In 5 hours the patient did not provide urine sample I did assess the patient's labs which appear to be at baseline without elevated white blood cell count.  Respiratory panel negative.  Patient given food.  His pain is well-controlled at this time after rest.  He appears otherwise appropriate for discharge.  Amount and/or Complexity of Data Reviewed Labs: ordered. Decision-making details documented in ED Course.           Final Clinical Impression(s) / ED Diagnoses Final diagnoses:  None    Rx / DC Orders ED Discharge Orders     None         Margarita Mail, PA-C 10/29/22 2146    Teressa Lower, MD 10/30/22 1326

## 2022-11-07 ENCOUNTER — Other Ambulatory Visit: Payer: Self-pay

## 2022-11-07 ENCOUNTER — Encounter (HOSPITAL_COMMUNITY): Payer: Self-pay

## 2022-11-07 ENCOUNTER — Emergency Department (HOSPITAL_COMMUNITY)
Admission: EM | Admit: 2022-11-07 | Discharge: 2022-11-08 | Disposition: A | Discharge status: Home / Self Care | Payer: 59 | Attending: Emergency Medicine | Admitting: Emergency Medicine

## 2022-11-07 DIAGNOSIS — E119 Type 2 diabetes mellitus without complications: Secondary | ICD-10-CM | POA: Insufficient documentation

## 2022-11-07 DIAGNOSIS — F3130 Bipolar disorder, current episode depressed, mild or moderate severity, unspecified: Secondary | ICD-10-CM | POA: Insufficient documentation

## 2022-11-07 DIAGNOSIS — F319 Bipolar disorder, unspecified: Secondary | ICD-10-CM | POA: Diagnosis present

## 2022-11-07 DIAGNOSIS — Z7984 Long term (current) use of oral hypoglycemic drugs: Secondary | ICD-10-CM | POA: Diagnosis not present

## 2022-11-07 DIAGNOSIS — F1910 Other psychoactive substance abuse, uncomplicated: Secondary | ICD-10-CM

## 2022-11-07 DIAGNOSIS — I10 Essential (primary) hypertension: Secondary | ICD-10-CM | POA: Insufficient documentation

## 2022-11-07 DIAGNOSIS — Z5900 Homelessness unspecified: Secondary | ICD-10-CM | POA: Diagnosis not present

## 2022-11-07 DIAGNOSIS — F1994 Other psychoactive substance use, unspecified with psychoactive substance-induced mood disorder: Secondary | ICD-10-CM | POA: Diagnosis present

## 2022-11-07 DIAGNOSIS — R45851 Suicidal ideations: Secondary | ICD-10-CM | POA: Diagnosis not present

## 2022-11-07 DIAGNOSIS — F17210 Nicotine dependence, cigarettes, uncomplicated: Secondary | ICD-10-CM | POA: Diagnosis not present

## 2022-11-07 DIAGNOSIS — F1914 Other psychoactive substance abuse with psychoactive substance-induced mood disorder: Secondary | ICD-10-CM | POA: Insufficient documentation

## 2022-11-07 LAB — CBC WITH DIFFERENTIAL/PLATELET
Abs Immature Granulocytes: 0.01 10*3/uL (ref 0.00–0.07)
Basophils Absolute: 0 10*3/uL (ref 0.0–0.1)
Basophils Relative: 1 %
Eosinophils Absolute: 0.3 10*3/uL (ref 0.0–0.5)
Eosinophils Relative: 5 %
HCT: 35 % — ABNORMAL LOW (ref 39.0–52.0)
Hemoglobin: 11.7 g/dL — ABNORMAL LOW (ref 13.0–17.0)
Immature Granulocytes: 0 %
Lymphocytes Relative: 27 %
Lymphs Abs: 1.3 10*3/uL (ref 0.7–4.0)
MCH: 33.9 pg (ref 26.0–34.0)
MCHC: 33.4 g/dL (ref 30.0–36.0)
MCV: 101.4 fL — ABNORMAL HIGH (ref 80.0–100.0)
Monocytes Absolute: 0.5 10*3/uL (ref 0.1–1.0)
Monocytes Relative: 10 %
Neutro Abs: 2.8 10*3/uL (ref 1.7–7.7)
Neutrophils Relative %: 57 %
Platelets: 103 10*3/uL — ABNORMAL LOW (ref 150–400)
RBC: 3.45 MIL/uL — ABNORMAL LOW (ref 4.22–5.81)
RDW: 12.9 % (ref 11.5–15.5)
WBC: 4.9 10*3/uL (ref 4.0–10.5)
nRBC: 0 % (ref 0.0–0.2)

## 2022-11-07 LAB — COMPREHENSIVE METABOLIC PANEL
ALT: 14 U/L (ref 0–44)
AST: 22 U/L (ref 15–41)
Albumin: 3.4 g/dL — ABNORMAL LOW (ref 3.5–5.0)
Alkaline Phosphatase: 111 U/L (ref 38–126)
Anion gap: 8 (ref 5–15)
BUN: 13 mg/dL (ref 8–23)
CO2: 21 mmol/L — ABNORMAL LOW (ref 22–32)
Calcium: 8.7 mg/dL — ABNORMAL LOW (ref 8.9–10.3)
Chloride: 106 mmol/L (ref 98–111)
Creatinine, Ser: 0.69 mg/dL (ref 0.61–1.24)
GFR, Estimated: >60 mL/min (ref >60)
Glucose, Bld: 181 mg/dL — ABNORMAL HIGH (ref 70–99)
Potassium: 3.5 mmol/L (ref 3.5–5.1)
Sodium: 135 mmol/L (ref 135–145)
Total Bilirubin: 1.1 mg/dL (ref 0.3–1.2)
Total Protein: 6.3 g/dL — ABNORMAL LOW (ref 6.5–8.1)

## 2022-11-07 LAB — ETHANOL: Alcohol, Ethyl (B): <10 mg/dL (ref <10)

## 2022-11-07 MED ORDER — THIAMINE MONONITRATE 100 MG PO TABS
100.0000 mg | ORAL_TABLET | Freq: Every day | ORAL | Status: DC
  Administered 2022-11-07 – 2022-11-08 (×2): 100 mg via ORAL
  Filled 2022-11-07 (×2): qty 1

## 2022-11-07 MED ORDER — LORAZEPAM 1 MG PO TABS
0.0000 mg | ORAL_TABLET | Freq: Four times a day (QID) | ORAL | Status: DC
  Administered 2022-11-07: 1 mg via ORAL
  Filled 2022-11-07: qty 1

## 2022-11-07 MED ORDER — THIAMINE HCL 100 MG/ML IJ SOLN: 100.0000 mg | Freq: Every day | INTRAMUSCULAR | Status: DC

## 2022-11-07 MED ORDER — LORAZEPAM 2 MG/ML IJ SOLN: 0.0000 mg | Freq: Four times a day (QID) | INTRAMUSCULAR | Status: DC

## 2022-11-07 MED ORDER — LORAZEPAM 2 MG/ML IJ SOLN: 0.0000 mg | Freq: Two times a day (BID) | INTRAMUSCULAR | Status: DC

## 2022-11-07 MED ORDER — LORAZEPAM 1 MG PO TABS: 0.0000 mg | ORAL_TABLET | Freq: Two times a day (BID) | ORAL | Status: DC

## 2022-11-07 NOTE — ED Triage Notes (Signed)
BIB [facility] and BHRT counselor and is voluntary. Pt is homeless, double amputee arriving with his wheelchair and belongings- has been on a cocaine/alcohol binge for several days. Pt endorses SI now, also wants detox.

## 2022-11-07 NOTE — ED Notes (Signed)
Pt belongings in locker 27, pt wheelchair parked in front of locker

## 2022-11-07 NOTE — ED Provider Notes (Addendum)
[place] EMERGENCY DEPARTMENT AT [facility] Provider Note   CSN: [id number] Arrival date & time: 11/07/22  1858     History  Chief Complaint  Patient presents with   Drug / Alcohol Assessment   Suicidal    Cory Palmer is a 62 y.o. male with a past medical history of housing insecurity, polysubstance use disorder and suicidal ideations presenting today with [facility]PD and BHRT due to substance use and suicidal ideations.  He reports he has been struggling with this since his teenage years.  Reports a pack of cigarettes and pint of wine daily.  Also smokes crack regularly, last use of all of the above was earlier today.  The original plan was for the patient to go to [facility] where he was "accepted" per [facility] however they wanted him to be medically cleared.  Behavioral health urgent care would not take him due to his double amputee status   Patient reports history of depression, bipolar disorder on multiple medications.  He takes them intermittently because his medications often get stolen due to his housing insecurity.  Drug / Alcohol Assessment      Home Medications Prior to Admission medications   Medication Sig Start Date End Date Taking? Authorizing Provider  amoxicillin-clavulanate (AUGMENTIN) 875-125 MG tablet Take 1 tablet by mouth every 12 (twelve) hours. 09/13/22   Al Decant, PA-C  benzonatate (TESSALON) 100 MG capsule Take 1 capsule (100 mg total) by mouth every 8 (eight) hours as needed for cough. 09/13/22   Al Decant, PA-C  gabapentin (NEURONTIN) 300 MG capsule Take 1 capsule (300 mg) by mouth 3 (three) times daily. 10/10/22   Benjiman Core, MD  metFORMIN (GLUCOPHAGE) 500 MG tablet Take 1 tablet (500 mg total) by mouth 2 (two) times daily with a meal. 08/30/22   Joseph Art, DO      Allergies    Atorvastatin calcium, Gemfibrozil, and Insulin glargine    Review of Systems   Review of Systems  Physical Exam Updated Vital Signs There  were no vitals taken for this visit. Physical Exam Vitals and nursing note reviewed.  Constitutional:      Appearance: Normal appearance.  HENT:     Head: Normocephalic and atraumatic.  Eyes:     General: No scleral icterus.    Conjunctiva/sclera: Conjunctivae normal.  Pulmonary:     Effort: Pulmonary effort is normal. No respiratory distress.  Musculoskeletal:     Comments: Bilateral BKA amputations.  Skin:    Findings: No rash.  Neurological:     Mental Status: He is alert.  Psychiatric:        Mood and Affect: Mood normal.     ED Results / Procedures / Treatments   Labs (all labs ordered are listed, but only abnormal results are displayed) Labs Reviewed  COMPREHENSIVE METABOLIC PANEL  ETHANOL  RAPID URINE DRUG SCREEN, HOSP PERFORMED  CBC WITH DIFFERENTIAL/PLATELET    EKG None  Radiology No results found.  Procedures Procedures    Medications Ordered in ED Medications  LORazepam (ATIVAN) injection 0-4 mg (has no administration in time range)    Or  LORazepam (ATIVAN) tablet 0-4 mg (has no administration in time range)  LORazepam (ATIVAN) injection 0-4 mg (has no administration in time range)    Or  LORazepam (ATIVAN) tablet 0-4 mg (has no administration in time range)  thiamine (VITAMIN B1) tablet 100 mg (has no administration in time range)    Or  thiamine (VITAMIN B1) injection  100 mg (has no administration in time range)    ED Course/ Medical Decision Making/ A&P                             Medical Decision Making Amount and/or Complexity of Data Reviewed Labs: ordered.  Risk OTC drugs. Prescription drug management.   62 year old male presenting voluntarily with [facility] and behavioral health team.  Plan is for the patient to go to [facility] however he was cleared first.  He does endorse some chronic passive suicidal ideations.  Medically cleared at this time despite pending labs.  Hope is that psych and social work will help in patient's  disposition.  Lab work is relatively unremarkable.  Patient is medically cleared  Final Clinical Impression(s) / ED Diagnoses Final diagnoses:  Polysubstance abuse  Suicidal ideation    Rx / DC Orders ED Discharge Orders     None         Jimya Ciani A, PA-C 11/07/22 2133   Of note, social work and [employer] team have worked to reorder the patient in a wheelchair.  He will need to receive this.  See their documentation for updates   Woodroe Chen 11/07/22 2139    Benjiman Core, MD 11/07/22 [phone]

## 2022-11-07 NOTE — Progress Notes (Addendum)
Addend: 9:36  RNCM has ordered patient's wheelchair through [employer] reports that it will be delivered to [facility] for the patient for when he DC. Once DC patient will return to [facility].   Patient is currently under Psych once cleared TOC will arrange transportation.

## 2022-11-07 NOTE — Social Work (Addendum)
CSW has contacted the [facility], The RNCM will order this patient a wheel chair. At this time [facility] will continue to follow the patient for any DC/ Transport needs. RNCM will speak with patient.

## 2022-11-08 DIAGNOSIS — F1994 Other psychoactive substance use, unspecified with psychoactive substance-induced mood disorder: Secondary | ICD-10-CM

## 2022-11-08 LAB — RAPID URINE DRUG SCREEN, HOSP PERFORMED
Amphetamines: NOT DETECTED
Barbiturates: NOT DETECTED
Benzodiazepines: NOT DETECTED
Cocaine: POSITIVE — AB
Opiates: NOT DETECTED
Tetrahydrocannabinol: NOT DETECTED

## 2022-11-08 NOTE — ED Provider Notes (Addendum)
Emergency Medicine Observation Re-evaluation Note  Cory Palmer is a 62 y.o. male, seen on rounds today.  Pt initially presented to the ED for complaints of Drug / Alcohol Assessment and Suicidal Currently, the patient is awaiting psych assessment.  Physical Exam  BP 123/77 (BP Location: Left Arm)   Pulse 64   Temp 98.6 F (37 C) (Oral)   Resp 18   SpO2 93%  Physical Exam General: Calm Cardiac: well perfused  Lungs: even respirations Psych: calm  ED Course / MDM  EKG:   I have reviewed the labs performed to date as well as medications administered while in observation.  Recent changes in the last 24 hours include TTS evaluation with psych clearance.   DME attestation. Patient does not have legs and requires wheelchair for ADLs. Unable to use walker or caine.    Plan  Current plan is for discharge today. Patient cleared by psych. SW to provide wheelchair at discharge.    Maia Plan, MD 11/08/22 1501    Maia Plan, MD 11/08/22 [phone]

## 2022-11-08 NOTE — Consult Note (Cosign Needed Addendum)
BH ED ASSESSMENT   Reason for Consult:  Psych Consult  Referring Physician:  Georgeanna LeaMadison Redwine, PA-C Patient Identification: Cory Palmer Posa MRN:  [id number] ED Chief Complaint: Substance induced mood disorder  Diagnosis:  Principal Problem:   Substance induced mood disorder Active Problems:   Homelessness   Depressed bipolar disorder   Suicide ideation   ED Assessment Time Calculation: Start Time: 0945 Stop Time: 1025 Total Time in Minutes (Assessment Completion): 40    HPI: BIB [facility] and BHRT counselor and is voluntary. Pt is homeless, double amputee arriving with his wheelchair and belongings- has been on a cocaine/alcohol binge for several days. Pt endorses SI now, also wants detox.    Subjective:  Cory Palmer Llerena, 62 y.o., male patient seen face to face by this provider, consulted with Dr. Lucianne MussKumar; and chart reviewed on 11/08/22.  On evaluation Cory Palmer Bensman reports that he is having both suicidal ideation, that is off and on, stating that "I am tired of being in these moods, and being broke all the time."Patient currently denies SI/HI/AVH.  Patient states that he went on a cocaine and alcohol, because he was just tired of feeling dependent and broke, states he uses all of his disability check on getting hotel room, and using substances.  Patient unsure about how much cocaine/crack use used, states he has been drinking a lot MD 20/20, wild [place], and occasionally liquor.  Patient UDS positive for cocaine, BAL less than 10.  Patient states he has a diagnosis of bipolar, compliant with medications, but unable to inform provider of the medications that he takes.  During evaluation Cory Palmer Orlov is laying in hospital bed in no acute distress. He is alert, oriented x 4, irritable during assessment.  His mood is euthymic with congruent affect. He has appropriate speech, and behavior.  Objectively there is no evidence of psychosis/mania or delusional thinking.  He currently denies suicidal and  homicidal ideation, psychosis, and paranoia. Patient has a history of bilateral amputation, and states it happened due to inconsistency with diabetes.  Patient frequents the ER with mental health issues including suicidal ideation. [facility] is also [facility]    With patient permission spoke with Nance Pewdam Williams [phone], who is a [facility] [employer] ambassador, and is currently helping patient find housing. Also spoke with Behavioral Health Response Team [facility](BHRT) and they states they are in process of helping him get into a detox program in [place]. [facility] and Mr. Mayford KnifeWilliams will meet up with patient there.  Patient is Psychiatrically cleared.   Consulted with Dr. Lucianne MussKumar, she is in agreement with patient being psychiatrically cleared.   At this time Cory Palmer Ohagan is educated and verbalizes understanding of mental health resources and other crisis services in the community. He is instructed to call 911 and present to the nearest emergency room should he experience any suicidal/homicidal ideation, auditory/visual/hallucinations, or detrimental worsening of his mental health condition.  Past Psychiatric History: Depression, anxiety, Bipolar Depression and Polysubstance abuse. [facility] but have not been there for years he says. No outpatient Mental healthcare at this time.   Risk to Self or Others: Is the patient at risk to self? Patient denies  Has the patient been a risk to self in the past 6 months? Patient denies  Has the patient been a risk to self within the distant past? Patient denies  Is the patient  a risk to others? Patient denies  Has the patient been a risk to others in the past 6 months? Patient denies  Has the patient been a risk to others within the distant past? Patient denies    [place] Scale:  Flowsheet Row ED from 11/07/2022 in [facility]  Emergency Department at [facility] ED from 10/29/2022 in [facility] Emergency Department at [facility] ED from 10/10/2022 in [facility] Emergency Department at [facility]  C-SSRS RISK CATEGORY Error: Question 6 not populated No Risk Error: Q3, 4, or 5 should not be populated when Q2 is No       AIMS:  , , ,  ,   ASAM:    Substance Abuse:     Past Medical History:  Past Medical History:  Diagnosis Date   Anxiety    Depressed bipolar disorder    Diabetes mellitus without complication    Hypertension    Liver cirrhosis    Osteomyelitis of left foot 10/19/2019   Osteomyelitis of toe of left foot    PAF (paroxysmal atrial fibrillation)    S/P transmetatarsal amputation of foot, 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    Past Surgical History:  Procedure Laterality Date   AMPUTATION Left 09/28/2018   Procedure: LEFT TRANSMETATARSAL AMPUTATION;  Surgeon: Nadara Mustard, MD;  Location: [facility] OR;  Service: Orthopedics;  Laterality: Left;   AMPUTATION Left 10/23/2019   Procedure: AMPUTATION BELOW KNEE;  Surgeon: Nadara Mustard, MD;  Location: [facility] OR;  Service: Orthopedics;  Laterality: Left;   BELOW KNEE LEG AMPUTATION Right    ESOPHAGOGASTRODUODENOSCOPY (EGD) WITH PROPOFOL N/A 10/19/2020   Procedure: ESOPHAGOGASTRODUODENOSCOPY (EGD) WITH PROPOFOL;  Surgeon: Charna Elizabeth, MD;  Location[facility] ENDOSCOPY;  Service: Endoscopy;  Laterality: N/A;   TEE WITHOUT CARDIOVERSION N/A 10/25/2019   Procedure: TRANSESOPHAGEAL ECHOCARDIOGRAM (TEE);  Surgeon: Lewayne Bunting, MD;  Location: [facility] ENDOSCOPY;  Service: Cardiovascular;  Laterality: N/A;   Family History:  Family History  Problem Relation Age of Onset   Hypertension Other    Diabetes Mellitus II Other     Social History:  Social History   Substance and Sexual Activity  Alcohol Use Not Currently     Social History   Substance and Sexual Activity  Drug Use Not Currently   Types: Cocaine   Comment: Last used in July 2023    Social  History   Socioeconomic History   Marital status: Single    Spouse name: Not on file   Number of children: Not on file   Years of education: Not on file   Highest education level: Not on file  Occupational History   Not on file  Tobacco Use   Smoking status: Every Day    Packs/day: .25    Types: Cigarettes   Smokeless tobacco: Never  Vaping Use   Vaping Use: Never used  Substance and Sexual Activity   Alcohol use: Not Currently   Drug use: Not Currently    Types: Cocaine    Comment: Last used in July 2023   Sexual activity: Not Currently  Other Topics Concern   Not on file  Social History Narrative   Not on file   Social Determinants of Health   Financial Resource Strain: Unknown (05/07/2018)   Overall Financial Resource Strain (CARDIA)    Difficulty of Paying Living Expenses: Patient declined  Food Insecurity: Unknown (05/07/2018)   Hunger Vital Sign    Worried About Running Out of Food in the Last Year: Patient  declined    Barista in the Last Year: Patient declined  Transportation Needs: Unknown (05/07/2018)   PRAPARE - Administrator, Civil Service (Medical): Patient declined    Lack of Transportation (Non-Medical): Patient declined  Physical Activity: Unknown (05/07/2018)   Exercise Vital Sign    Days of Exercise per Week: Patient declined    Minutes of Exercise per Session: Patient declined  Stress: Not on file  Social Connections: Not on file      Allergies:   Allergies  Allergen Reactions   Atorvastatin Calcium Nausea And Vomiting   Gemfibrozil     Unknown   Insulin Glargine     Unknown    Labs:  Results for orders placed or performed during the hospital encounter of 11/07/22 (from the past 48 hour(s))  Comprehensive metabolic panel     Status: Abnormal   Collection Time: 11/07/22  8:02 PM  Result Value Ref Range   Sodium 135 135 - 145 mmol/L   Potassium 3.5 3.5 - 5.1 mmol/L   Chloride 106 98 - 111 mmol/L   CO2 21 (L) 22 - 32  mmol/L   Glucose, Bld 181 (H) 70 - 99 mg/dL    Comment: Glucose reference range applies only to samples taken after fasting for at least 8 hours.   BUN 13 8 - 23 mg/dL   Creatinine, Ser 6.96 0.61 - 1.24 mg/dL   Calcium 8.7 (L) 8.9 - 10.3 mg/dL   Total Protein 6.3 (L) 6.5 - 8.1 g/dL   Albumin 3.4 (L) 3.5 - 5.0 g/dL   AST 22 15 - 41 U/L   ALT 14 0 - 44 U/L   Alkaline Phosphatase 111 38 - 126 U/L   Total Bilirubin 1.1 0.3 - 1.2 mg/dL   GFR, Estimated >78 >93 mL/min    Comment: (NOTE) Calculated using the CKD-EPI Creatinine Equation (2021)    Anion gap 8 5 - 15    Comment: Performed at [facility], [address], [place], [place] [address]  CBC with Diff     Status: Abnormal   Collection Time: 11/07/22  8:02 PM  Result Value Ref Range   WBC 4.9 4.0 - 10.5 K/uL   RBC 3.45 (L) 4.22 - 5.81 MIL/uL   Hemoglobin 11.7 (L) 13.0 - 17.0 g/dL   HCT 51.0 (L) 25.8 - 52.7 %   MCV 101.4 (H) 80.0 - 100.0 fL   MCH 33.9 26.0 - 34.0 pg   MCHC 33.4 30.0 - 36.0 g/dL   RDW 78.2 42.3 - 53.6 %   Platelets 103 (L) 150 - 400 K/uL    Comment: Immature Platelet Fraction may be clinically indicated, consider ordering this additional test [id number] REPEATED TO VERIFY    nRBC 0.0 0.0 - 0.2 %   Neutrophils Relative % 57 %   Neutro Abs 2.8 1.7 - 7.7 K/uL   Lymphocytes Relative 27 %   Lymphs Abs 1.3 0.7 - 4.0 K/uL   Monocytes Relative 10 %   Monocytes Absolute 0.5 0.1 - 1.0 K/uL   Eosinophils Relative 5 %   Eosinophils Absolute 0.3 0.0 - 0.5 K/uL   Basophils Relative 1 %   Basophils Absolute 0.0 0.0 - 0.1 K/uL   Immature Granulocytes 0 %   Abs Immature Granulocytes 0.01 0.00 - 0.07 K/uL    Comment: Performed at [facility], [address] [place], [place] [address]  Ethanol     Status: None   Collection  Time: 11/07/22  8:03 PM  Result Value Ref Range   Alcohol, Ethyl (Palmer) <10 <10 mg/dL    Comment: (NOTE) Lowest detectable limit for serum alcohol is 10  mg/dL.  For medical purposes only. Performed at [facility], [address] [place], [place] [address]   Urine rapid drug screen (hosp performed)     Status: Abnormal   Collection Time: 11/08/22 11:48 AM  Result Value Ref Range   Opiates NONE DETECTED NONE DETECTED   Cocaine POSITIVE (A) NONE DETECTED   Benzodiazepines NONE DETECTED NONE DETECTED   Amphetamines NONE DETECTED NONE DETECTED   Tetrahydrocannabinol NONE DETECTED NONE DETECTED   Barbiturates NONE DETECTED NONE DETECTED    Comment: (NOTE) DRUG SCREEN FOR MEDICAL PURPOSES ONLY.  IF CONFIRMATION IS NEEDED FOR ANY PURPOSE, NOTIFY LAB WITHIN 5 DAYS.  LOWEST DETECTABLE LIMITS FOR URINE DRUG SCREEN Drug Class                     Cutoff (ng/mL) Amphetamine and metabolites    1000 Barbiturate and metabolites    200 Benzodiazepine                 200 Opiates and metabolites        300 Cocaine and metabolites        300 THC                            50 Performed at [facility], [name] [place], [place] [address]     Current Facility-Administered Medications  Medication Dose Route Frequency Provider Last Rate Last Admin   LORazepam (ATIVAN) injection 0-4 mg  0-4 mg Intravenous Q6H Redwine, Madison A, PA-C       Or   LORazepam (ATIVAN) tablet 0-4 mg  0-4 mg Oral Q6H Redwine, Madison A, PA-C   1 mg at 11/07/22 1953   [START ON 11/10/2022] LORazepam (ATIVAN) injection 0-4 mg  0-4 mg Intravenous Q12H Redwine, Madison A, PA-C       Or   [START ON 11/10/2022] LORazepam (ATIVAN) tablet 0-4 mg  0-4 mg Oral Q12H Redwine, Madison A, PA-C       thiamine (VITAMIN B1) tablet 100 mg  100 mg Oral Daily Redwine, Madison A, PA-C   100 mg at 11/08/22 1017   Or   thiamine (VITAMIN B1) injection 100 mg  100 mg Intravenous Daily Redwine, Madison A, PA-C       Current Outpatient Medications  Medication Sig Dispense Refill   amoxicillin-clavulanate (AUGMENTIN) 875-125 MG tablet Take 1 tablet  by mouth every 12 (twelve) hours. 14 tablet 0   benzonatate (TESSALON) 100 MG capsule Take 1 capsule (100 mg total) by mouth every 8 (eight) hours as needed for cough. 21 capsule 0   gabapentin (NEURONTIN) 300 MG capsule Take 1 capsule (300 mg) by mouth 3 (three) times daily. 90 capsule 0   metFORMIN (GLUCOPHAGE) 500 MG tablet Take 1 tablet (500 mg total) by mouth 2 (two) times daily with a meal. 60 tablet 0    Musculoskeletal:  Patient observed resting in bed, patient is a double amputee, uses a wheelchair to get around.   Psychiatric Specialty Exam: Presentation  General Appearance:  Appropriate for Environment  Eye Contact: Minimal  Speech: Garbled  Speech Volume: Normal  Handedness: Right   Mood and Affect  Mood: Euthymic  Affect: Appropriate   Thought Process  Thought Processes: Coherent  Descriptions of Associations:Intact  Orientation:Partial  Thought Content:WDL  History of Schizophrenia/Schizoaffective disorder:No  Duration of Psychotic Symptoms:N/A  Hallucinations:No data recorded Ideas of Reference:None  Suicidal Thoughts:No data recorded Homicidal Thoughts:No data recorded  Sensorium  Memory: Immediate Fair; Remote Fair  Judgment: Fair  Insight: Fair   Art therapist  Concentration: Fair  Attention Span: Fair  Recall: Good  Fund of Knowledge: Good  Language: Good   Psychomotor Activity  Psychomotor Activity:No data recorded  Assets  Assets: Communication Skills    Sleep  Sleep:No data recorded  Physical Exam: Physical Exam Vitals and nursing note reviewed. Exam conducted with a chaperone present.  Abdominal:     General: Abdomen is flat.  Musculoskeletal:     Cervical back: Normal range of motion.  Neurological:     Mental Status: He is alert.  Psychiatric:        Attention and Perception: Attention normal.        Mood and Affect: Mood normal.        Speech: Speech normal.        Behavior:  Behavior is cooperative.        Thought Content: Thought content normal.        Cognition and Memory: Memory normal.        Judgment: Judgment is impulsive.    Review of Systems  Constitutional: Negative.   Respiratory: Negative.    Psychiatric/Behavioral:  Positive for depression and substance abuse.    Blood pressure 131/73, pulse 75, temperature 98.9 F (37.2 C), temperature source Oral, resp. rate 18, SpO2 97 %. There is no height or weight on file to calculate BMI.   Medical Decision Making: Patient is psychiatrically cleared. Patient case review and discussed with Dr. Lucianne Muss, and patient does not meet inpatient criteria for inpatient psychiatric treatment. At time of discharge, patient denies SI, HI, AVH and can contract for safety. He demonstrated no overt evidence of psychosis or mania. Prior to discharge, he verbalized that they understood warning signs, triggers, and symptoms of worsening mental health and how to access emergency mental health care if they felt it was needed. Patient was instructed to call 911 or return to the emergency room if they experienced any concerning symptoms after discharge. Patient voiced understanding and agreed to the above.  Patient given resources to follow up with behavioral health urgent care for therapy and medication management, patient also given information to follow up with BHRT.  Adam from [facility] [employer], we will be meeting with patient today to discuss his plan of care for detox facility. Patient denies access to weapons. Safety planning completed.     Disposition: Patient does not meet criteria for psychiatric inpatient admission. Supportive therapy provided about ongoing stressors. Discussed crisis plan, support from social network, calling 911, coming to the Emergency Department, and calling Suicide Hotline.  Ugo Thoma MOTLEY-MANGRUM, PMHNP 11/08/2022 2:26 PM

## 2022-11-08 NOTE — BH Assessment (Addendum)
TTS clinician attempted to complete TTS assessment. Per [facility] unit staff, patient asleep and unable to engage. Per [facility] unit staff requested patient to be seen in morning.

## 2022-11-08 NOTE — ED Notes (Signed)
Patient discharged off unit to home per provider. Patient alert , calm, cooperative, no s/s of distress. Belongings given to patient.  Patient off unit in w/c   patient off unit in w/c and transported community caregivers.

## 2022-11-08 NOTE — Discharge Instructions (Signed)

## 2022-12-05 ENCOUNTER — Emergency Department (HOSPITAL_COMMUNITY): Payer: 59

## 2022-12-05 ENCOUNTER — Emergency Department (HOSPITAL_COMMUNITY)
Admission: EM | Admit: 2022-12-05 | Discharge: 2022-12-06 | Disposition: A | Discharge status: Home / Self Care | Payer: 59 | Attending: Emergency Medicine | Admitting: Emergency Medicine

## 2022-12-05 ENCOUNTER — Other Ambulatory Visit: Payer: Self-pay

## 2022-12-05 DIAGNOSIS — F3130 Bipolar disorder, current episode depressed, mild or moderate severity, unspecified: Secondary | ICD-10-CM | POA: Insufficient documentation

## 2022-12-05 DIAGNOSIS — F319 Bipolar disorder, unspecified: Secondary | ICD-10-CM | POA: Diagnosis present

## 2022-12-05 DIAGNOSIS — T50904A Poisoning by unspecified drugs, medicaments and biological substances, undetermined, initial encounter: Secondary | ICD-10-CM

## 2022-12-05 DIAGNOSIS — Z91148 Patient's other noncompliance with medication regimen for other reason: Secondary | ICD-10-CM

## 2022-12-05 DIAGNOSIS — Z5900 Homelessness unspecified: Secondary | ICD-10-CM | POA: Diagnosis not present

## 2022-12-05 DIAGNOSIS — E119 Type 2 diabetes mellitus without complications: Secondary | ICD-10-CM | POA: Insufficient documentation

## 2022-12-05 DIAGNOSIS — R4182 Altered mental status, unspecified: Secondary | ICD-10-CM | POA: Diagnosis not present

## 2022-12-05 DIAGNOSIS — Z87898 Personal history of other specified conditions: Secondary | ICD-10-CM

## 2022-12-05 DIAGNOSIS — Z7984 Long term (current) use of oral hypoglycemic drugs: Secondary | ICD-10-CM | POA: Diagnosis not present

## 2022-12-05 LAB — CBC WITH DIFFERENTIAL/PLATELET
Abs Immature Granulocytes: 0.03 10*3/uL (ref 0.00–0.07)
Basophils Absolute: 0 10*3/uL (ref 0.0–0.1)
Basophils Relative: 0 %
Eosinophils Absolute: 0.1 10*3/uL (ref 0.0–0.5)
Eosinophils Relative: 1 %
HCT: 36.6 % — ABNORMAL LOW (ref 39.0–52.0)
Hemoglobin: 12.8 g/dL — ABNORMAL LOW (ref 13.0–17.0)
Immature Granulocytes: 0 %
Lymphocytes Relative: 7 %
Lymphs Abs: 0.5 10*3/uL — ABNORMAL LOW (ref 0.7–4.0)
MCH: 33.5 pg (ref 26.0–34.0)
MCHC: 35 g/dL (ref 30.0–36.0)
MCV: 95.8 fL (ref 80.0–100.0)
Monocytes Absolute: 0.4 10*3/uL (ref 0.1–1.0)
Monocytes Relative: 5 %
Neutro Abs: 6.1 10*3/uL (ref 1.7–7.7)
Neutrophils Relative %: 87 %
Platelets: 87 10*3/uL — ABNORMAL LOW (ref 150–400)
RBC: 3.82 MIL/uL — ABNORMAL LOW (ref 4.22–5.81)
RDW: 12.8 % (ref 11.5–15.5)
WBC: 7.1 10*3/uL (ref 4.0–10.5)
nRBC: 0 % (ref 0.0–0.2)

## 2022-12-05 LAB — COMPREHENSIVE METABOLIC PANEL
ALT: 15 U/L (ref 0–44)
AST: 25 U/L (ref 15–41)
Albumin: 3.5 g/dL (ref 3.5–5.0)
Alkaline Phosphatase: 95 U/L (ref 38–126)
Anion gap: 10 (ref 5–15)
BUN: 11 mg/dL (ref 8–23)
CO2: 24 mmol/L (ref 22–32)
Calcium: 9 mg/dL (ref 8.9–10.3)
Chloride: 105 mmol/L (ref 98–111)
Creatinine, Ser: 0.83 mg/dL (ref 0.61–1.24)
GFR, Estimated: >60 mL/min (ref >60)
Glucose, Bld: 212 mg/dL — ABNORMAL HIGH (ref 70–99)
Potassium: 3.6 mmol/L (ref 3.5–5.1)
Sodium: 139 mmol/L (ref 135–145)
Total Bilirubin: 2.1 mg/dL — ABNORMAL HIGH (ref 0.3–1.2)
Total Protein: 6.7 g/dL (ref 6.5–8.1)

## 2022-12-05 LAB — SALICYLATE LEVEL: Salicylate Lvl: <7 mg/dL — ABNORMAL LOW (ref 7.0–30.0)

## 2022-12-05 LAB — CBG MONITORING, ED
Glucose-Capillary: 212 mg/dL — ABNORMAL HIGH (ref 70–99)
Glucose-Capillary: 175 mg/dL — ABNORMAL HIGH (ref 70–99)
Glucose-Capillary: 134 mg/dL — ABNORMAL HIGH (ref 70–99)

## 2022-12-05 LAB — ETHANOL: Alcohol, Ethyl (B): <10 mg/dL (ref <10)

## 2022-12-05 LAB — ACETAMINOPHEN LEVEL: Acetaminophen (Tylenol), Serum: <10 ug/mL — ABNORMAL LOW (ref 10–30)

## 2022-12-05 MED ORDER — INSULIN ASPART 100 UNIT/ML IJ SOLN
0.0000 [IU] | Freq: Three times a day (TID) | INTRAMUSCULAR | Status: DC
  Administered 2022-12-05: 2 [IU] via SUBCUTANEOUS
  Administered 2022-12-06 (×2): 1 [IU] via SUBCUTANEOUS

## 2022-12-05 MED ORDER — SODIUM CHLORIDE 0.9 % IV BOLUS
1000.0000 mL | Freq: Once | INTRAVENOUS | Status: AC
  Administered 2022-12-05: 1000 mL via INTRAVENOUS

## 2022-12-05 NOTE — Progress Notes (Signed)
TOC CSW contacted the [employer] on [place]. [phone].  Cory Palmer, provide security at [employer].  Cory Palmer states she has his wheelchair and will proved it to him on his return.  Cory Palmer, MSW, LCSW-A Pronouns:  She/Her/Hers [facility] Clinical Social Worker Direct Number:  [phone] [email or web address]

## 2022-12-05 NOTE — ED Notes (Signed)
Pt will not urinate at this time. 

## 2022-12-05 NOTE — Discharge Instructions (Signed)
  Outpatient psychiatric Services  Walk in hours for medication management Monday, Wednesday, Thursday, and Friday from 8:00 AM to 11:00 AM Recommend arriving by by 7:30 AM.  It is first come first serve.    Walk in hours for therapy intake Monday and Wednesday only 8:00 AM to 11:00 AM Encouraged to arrive by 7:30 AM.  It is first come first serve   Inpatient patient psychiatric services The Facility Based Crisis Unit offers comprehensive behavioral heath care services for mental health and substance abuse treatment.  Social work can also assist with referral to or getting you into a rehabilitation program short or long term         [facility]  Hours Monday - Friday: Services: 8:00AM - 3:00PM Offices: 8:00AM - 5:00PM  Physical Address [address] [place], [place] [address]   Please use this address for IRC Mailing Address [address] [address] [place], [place] [address]  The IRC helps people reconnect This is a safe place to rest, take care of basic needs and access the services and community that make all the difference. Our guests come to the IRC to take a class, do laundry, meet with a case manager or to get their mail. Sometimes they just need to sit in our dayroom and enjoy a conversation.  Here you will find everything from shower facilities to a computer lab, a mail room, classrooms and meeting spaces.  The IRC helps people reconnect with their own lives and with the community at large.  A caring community setting One of the most exciting aspects of the IRC is that so many individuals and organizations in the community are a part of the everyday experience. Whether it's a hair stylist or law firm offering services right in-house, our partners make the IRC a truly interactive resource center where services are brought to our guests. The IRC brings together a comprehensive community of talented people who not only want to help solve problems, but also to be a  part of our guests' lives.  Integrated Care We take a person-centered approach to assistance that includes: Case management [facility] Mental health nurse Referrals  Fundamental Services We start with necessities: Showers and hygiene supplies Laundry Phone access Mailing addresses and mailboxes Replacement IDs Onsite barbershop Storage lockers [place] winter warming center  Self-Sufficiency We connect our guests with: Skilled trade classes Job skills classes Resume and jobs application assistance Interview training GED classes Professional clothing vouchers Financial literacy  

## 2022-12-05 NOTE — Consult Note (Signed)
[facility] ED ASSESSMENT   Reason for Consult:  Eval Referring Physician:  Lockie Mola Patient Identification: Cory Palmer MRN:  [id number] ED Chief Complaint: Depressed bipolar disorder [facility])  Diagnosis:  Principal Problem:   Depressed bipolar disorder (HCC) Active Problems:   Homelessness   ED Assessment Time Calculation: Start Time: 1700 Stop Time: 1745 Total Time in Minutes (Assessment Completion): 45   Subjective:   Cory Palmer is a 62 y.o. male patient who was at the [employer] with apparent altered mental status, bystanders called EMS. It was reported pt told EMS he took "900 mg of Seroquel." Also reported EMS did leave the wheelchair with staff inside [employer]. He is a double amputee.   HPI:   Patient seen at [facility], ED for face-to-face psychiatric evaluation.  Patient is well-known to behavioral health service line.  Most recently patient was seen on 11/09/2022 at [facility] for polysubstance abuse, and also on 11/07/2022 at [facility] emergency department for polysubstance abuse.   Patient is cooperative and engages in conversation.  Patient tells me he did not use any illicit substances or alcohol.  ED staff has been unable to obtain a urine toxicology from him to verify, results are still pending.  He is also denying taking 900 mg of Seroquel.  Patient stated "I don't know where they heard that. I take 50 mg of Seroquel at night just like I should. I've been doing it for years. Maybe I took two last night but I know how to take my  meds." He denies overdosing on Seroquel. Denies any suicidal ideations. Denies attempting suicide last night. He denies HI. Denies AVH. Pt is actually requesting to discharge, he is hoping to get back to his wheelchair.   UDS is still pending, but suspicious about substance abuse being involved. Last UDS was performed on 11/08/22 and positive for cocaine. Does have documented hx of Etoh abuse.Per chart review, it appears he often refuses  to give UDS upon hospital arrival. Pt is very adamant that he had no suicidal ideations or attempts. He is able to contract for safety. He tells me he is still working with N[facility] P[facility] to try and obtain group home or low income housing. Explained to him that if [facility] he can go home tonight, if they are unable to/too busy he will need to stay in ED until he can get his wheelchair when [employer] reopens tomorrow. He is agreeable with this plan.   Pt is able to engage in coherent conversation. His thought content is intact. No concerns for psychosis or mania at this time. I do have suspicion this could be substance induced, considering he is unable to recall most of the events and has been refusing to participate in UDS. He is contracting for safety, will psychiatrically clear patient at this time.   Past Psychiatric History:  MDD, GAD, polysubstance abuse bipolar depression  Risk to Self or Others: Is the patient at risk to self? No Has the patient been a risk to self in the past 6 months? No Has the patient been a risk to self within the distant past? Yes Is the patient a risk to others? No Has the patient been a risk to others in the past 6 months? No Has the patient been a risk to others within the distant past? No  [place] Scale:  Flowsheet Row ED from 12/05/2022 in [facility] Emergency Department at  [facility] ED from 11/07/2022 in [facility] Emergency Department at [facility] ED from 10/29/2022 in [facility] Emergency Department at [facility]  C-SSRS RISK CATEGORY No Risk Error: Question 6 not populated No Risk       Past Medical History:  Past Medical History:  Diagnosis Date   Anxiety    Depressed bipolar disorder (HCC)    Diabetes mellitus without complication (HCC)    Hypertension    Liver cirrhosis (HCC)    Osteomyelitis of left foot (HCC) 10/19/2019   Osteomyelitis of toe of left foot (HCC)     PAF (paroxysmal atrial fibrillation) (HCC)    S/P transmetatarsal amputation of foot, 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    Past Surgical History:  Procedure Laterality Date   AMPUTATION Left 09/28/2018   Procedure: LEFT TRANSMETATARSAL AMPUTATION;  Surgeon: Nadara Mustard, MD;  Location: [facility] OR;  Service: Orthopedics;  Laterality: Left;   AMPUTATION Left 10/23/2019   Procedure: AMPUTATION BELOW KNEE;  Surgeon: Nadara Mustard, MD;  Location: [facility] OR;  Service: Orthopedics;  Laterality: Left;   BELOW KNEE LEG AMPUTATION Right    ESOPHAGOGASTRODUODENOSCOPY (EGD) WITH PROPOFOL N/A 10/19/2020   Procedure: ESOPHAGOGASTRODUODENOSCOPY (EGD) WITH PROPOFOL;  Surgeon: Charna Elizabeth, MD;  Location: [facility] ENDOSCOPY;  Service: Endoscopy;  Laterality: N/A;   TEE WITHOUT CARDIOVERSION N/A 10/25/2019   Procedure: TRANSESOPHAGEAL ECHOCARDIOGRAM (TEE);  Surgeon: Lewayne Bunting, MD;  Location: [facility] ENDOSCOPY;  Service: Cardiovascular;  Laterality: N/A;   Family History:  Family History  Problem Relation Age of Onset   Hypertension Other    Diabetes Mellitus II Other    Social History:  Social History   Substance and Sexual Activity  Alcohol Use Not Currently     Social History   Substance and Sexual Activity  Drug Use Not Currently   Types: Cocaine   Comment: Last used in July 2023    Social History   Socioeconomic History   Marital status: Single    Spouse name: Not on file   Number of children: Not on file   Years of education: Not on file   Highest education level: Not on file  Occupational History   Not on file  Tobacco Use   Smoking status: Every Day    Packs/day: .25    Types: Cigarettes   Smokeless tobacco: Never  Vaping Use   Vaping Use: Never used  Substance and Sexual Activity   Alcohol use: Not Currently   Drug use: Not Currently    Types: Cocaine    Comment: Last used in July 2023   Sexual activity: Not Currently  Other Topics Concern   Not on file  Social History Narrative   Not on  file   Social Determinants of Health   Financial Resource Strain: Unknown (05/07/2018)   Overall Financial Resource Strain (CARDIA)    Difficulty of Paying Living Expenses: Patient declined  Food Insecurity: Unknown (05/07/2018)   Hunger Vital Sign    Worried About Running Out of Food in the Last Year: Patient declined    Ran Out of Food in the Last Year: Patient declined  Transportation Needs: Unknown (05/07/2018)   PRAPARE - Administrator, Civil Service (Medical): Patient declined    Lack of Transportation (Non-Medical): Patient declined  Physical Activity: Unknown (05/07/2018)   Exercise Vital Sign    Days of Exercise per Week: Patient declined    Minutes of Exercise per Session: Patient declined  Stress: Not on file  Social Connections: Not on file     Allergies:   Allergies  Allergen Reactions   Atorvastatin Calcium Nausea And Vomiting   Gemfibrozil     Unknown   Insulin Glargine     Unknown    Labs:  Results for orders placed or performed during the hospital encounter of 12/05/22 (from the past 48 hour(s))  CBG monitoring, ED     Status: Abnormal   Collection Time: 12/05/22 11:11 AM  Result Value Ref Range   Glucose-Capillary 212 (H) 70 - 99 mg/dL    Comment: Glucose reference range applies only to samples taken after fasting for at least 8 hours.  CBC with Differential     Status: Abnormal   Collection Time: 12/05/22 11:42 AM  Result Value Ref Range   WBC 7.1 4.0 - 10.5 K/uL   RBC 3.82 (L) 4.22 - 5.81 MIL/uL   Hemoglobin 12.8 (L) 13.0 - 17.0 g/dL   HCT 16.1 (L) 09.6 - 04.5 %   MCV 95.8 80.0 - 100.0 fL   MCH 33.5 26.0 - 34.0 pg   MCHC 35.0 30.0 - 36.0 g/dL   RDW 40.9 81.1 - 91.4 %   Platelets 87 (L) 150 - 400 K/uL    Comment: Immature Platelet Fraction may be clinically indicated, consider ordering this additional test [id number] REPEATED TO VERIFY    nRBC 0.0 0.0 - 0.2 %   Neutrophils Relative % 87 %   Neutro Abs 6.1 1.7 - 7.7 K/uL    Lymphocytes Relative 7 %   Lymphs Abs 0.5 (L) 0.7 - 4.0 K/uL   Monocytes Relative 5 %   Monocytes Absolute 0.4 0.1 - 1.0 K/uL   Eosinophils Relative 1 %   Eosinophils Absolute 0.1 0.0 - 0.5 K/uL   Basophils Relative 0 %   Basophils Absolute 0.0 0.0 - 0.1 K/uL   Immature Granulocytes 0 %   Abs Immature Granulocytes 0.03 0.00 - 0.07 K/uL    Comment: Performed at [facility] Lab, [address] [place], [place] [address]  Comprehensive metabolic panel     Status: Abnormal   Collection Time: 12/05/22 11:42 AM  Result Value Ref Range   Sodium 139 135 - 145 mmol/L   Potassium 3.6 3.5 - 5.1 mmol/L   Chloride 105 98 - 111 mmol/L   CO2 24 22 - 32 mmol/L   Glucose, Bld 212 (H) 70 - 99 mg/dL    Comment: Glucose reference range applies only to samples taken after fasting for at least 8 hours.   BUN 11 8 - 23 mg/dL   Creatinine, Ser 7.84 0.61 - 1.24 mg/dL   Calcium 9.0 8.9 - 69.6 mg/dL   Total Protein 6.7 6.5 - 8.1 g/dL   Albumin 3.5 3.5 - 5.0 g/dL   AST 25 15 - 41 U/L   ALT 15 0 - 44 U/L   Alkaline Phosphatase 95 38 - 126 U/L   Total Bilirubin 2.1 (H) 0.3 - 1.2 mg/dL   GFR, Estimated >29 >52 mL/min    Comment: (NOTE) Calculated using the CKD-EPI Creatinine Equation (2021)    Anion gap 10 5 - 15    Comment: Performed at [facility], [address] [place], [place] [address]  Ethanol     Status: None   Collection Time: 12/05/22 11:42 AM  Result Value Ref Range   Alcohol, Ethyl (B) <10 <10 mg/dL    Comment: (NOTE) Lowest detectable limit for serum alcohol is 10 mg/dL.  For medical  purposes only. Performed at [facility], [address], [place], [place] [address]   Acetaminophen level     Status: Abnormal   Collection Time: 12/05/22 11:52 AM  Result Value Ref Range   Acetaminophen (Tylenol), Serum <10 (L) 10 - 30 ug/mL    Comment: (NOTE) Therapeutic concentrations vary significantly. A range of 10-30 ug/mL  may be an effective concentration for many patients.  However, some  are best treated at concentrations outside of this range. Acetaminophen concentrations >150 ug/mL at 4 hours after ingestion  and >50 ug/mL at 12 hours after ingestion are often associated with  toxic reactions.  Performed at [facility], [address] [place], [place] [address]   Salicylate level     Status: Abnormal   Collection Time: 12/05/22 11:52 AM  Result Value Ref Range   Salicylate Lvl <7.0 (L) 7.0 - 30.0 mg/dL    Comment: Performed at [facility], [address] [place], [place] [address]    Current Facility-Administered Medications  Medication Dose Route Frequency Provider Last Rate Last Admin   insulin aspart (novoLOG) injection 0-9 Units  0-9 Units Subcutaneous TID WC Curatolo, Adam, DO       Current Outpatient Medications  Medication Sig Dispense Refill   amoxicillin-clavulanate (AUGMENTIN) 875-125 MG tablet Take 1 tablet by mouth every 12 (twelve) hours. 14 tablet 0   benzonatate (TESSALON) 100 MG capsule Take 1 capsule (100 mg total) by mouth every 8 (eight) hours as needed for cough. 21 capsule 0   gabapentin (NEURONTIN) 300 MG capsule Take 1 capsule (300 mg) by mouth 3 (three) times daily. 90 capsule 0   metFORMIN (GLUCOPHAGE) 500 MG tablet Take 1 tablet (500 mg total) by mouth 2 (two) times daily with a meal. 60 tablet 0    Psychiatric Specialty Exam: Presentation  General Appearance:  Appropriate for Environment  Eye Contact: Minimal  Speech: Garbled  Speech Volume: Normal  Handedness: Right   Mood and Affect  Mood: Euthymic  Affect: Congruent   Thought Process  Thought Processes: Coherent  Descriptions of Associations:Intact  Orientation:Full (Time, Place and Person)  Thought Content:WDL  History of Schizophrenia/Schizoaffective disorder:No  Duration of Psychotic Symptoms:N/A  Hallucinations:Hallucinations: None  Ideas of Reference:None  Suicidal Thoughts:Suicidal Thoughts: No  Homicidal  Thoughts:Homicidal Thoughts: No   Sensorium  Memory: Immediate Fair; Recent Fair  Judgment: Fair  Insight: Fair   Art therapist  Concentration: Good  Attention Span: Good  Recall: Good  Fund of Knowledge: Good  Language: Good   Psychomotor Activity  Psychomotor Activity: Psychomotor Activity: Normal   Assets  Assets: Desire for Improvement; Leisure Time; Physical Health; Resilience    Sleep  Sleep: Sleep: Fair   Physical Exam: Physical Exam Neurological:     Mental Status: He is alert and oriented to person, place, and time.  Psychiatric:        Attention and Perception: Attention normal.        Mood and Affect: Mood normal.        Speech: Speech normal.        Behavior: Behavior is cooperative.        Thought Content: Thought content normal.    ROS Blood pressure 98/68, pulse 80, temperature 97.9 F (36.6 C), temperature source Oral, resp. rate 11, SpO2 97 %. There is no height or weight on file to calculate BMI.  Medical Decision Making: Pt case reviewed and discussed with Dr. Lucianne Muss. Pt is psychiatrically cleared, he is  requesting discharge. Pt denies needing medication refills.   Plan:  - Consulting civil engineer, Diplomatic Services operational officer, Dr. Lockie Mola, and RN notified that if EMS transportation can get him back to the [facility] before they close at 9 pm to obtain his wheelchair he can discharge tonight. However if EMS is unable to transport before closing time, he will need to remain in the ED overnight, and be transported to [facility] when they open so he can obtain his wheelchair.   Disposition: No evidence of imminent risk to self or others at present.   Patient does not meet criteria for psychiatric inpatient admission. Supportive therapy provided about ongoing stressors. Discussed crisis plan, support from social network, calling 911, coming to the Emergency Department, and calling Suicide Hotline.  Eligha Bridegroom, NP 12/05/2022 5:12 PM

## 2022-12-05 NOTE — ED Notes (Signed)
NP for psych at bedside

## 2022-12-05 NOTE — ED Provider Notes (Signed)
[place] EMERGENCY DEPARTMENT AT [facility] Provider Note   CSN: [id number] Arrival date & time: 12/05/22  1108     History  Chief Complaint  Patient presents with   Altered Mental Status    ELEASAR KIEL is a 62 y.o. male.  Patient here after what sounds like he took 900 mg of Seroquel last night to hurt himself.  He has gone back and forth about whether he had attempted self-harm.  He tells me he took the Seroquel because he wanted to.  He denies any other alcohol or drug use.  Ingestion was sometime last night.  He was found outside [employer] with unresponsiveness.  Brought here by EMS.  Patient has history of bipolar disorder, diabetes, paroxysmal A-fib not on anticoagulation and cirrhosis.  The history is provided by the patient.       Home Medications Prior to Admission medications   Medication Sig Start Date End Date Taking? Authorizing Provider  amoxicillin-clavulanate (AUGMENTIN) 875-125 MG tablet Take 1 tablet by mouth every 12 (twelve) hours. 09/13/22   Al Decant, PA-C  benzonatate (TESSALON) 100 MG capsule Take 1 capsule (100 mg total) by mouth every 8 (eight) hours as needed for cough. 09/13/22   Al Decant, PA-C  gabapentin (NEURONTIN) 300 MG capsule Take 1 capsule (300 mg) by mouth 3 (three) times daily. 10/10/22   Benjiman Core, MD  metFORMIN (GLUCOPHAGE) 500 MG tablet Take 1 tablet (500 mg total) by mouth 2 (two) times daily with a meal. 08/30/22   Joseph Art, DO      Allergies    Atorvastatin calcium, Gemfibrozil, and Insulin glargine    Review of Systems   Review of Systems  Physical Exam Updated Vital Signs BP 98/68   Pulse 80   Resp 11   SpO2 97%  Physical Exam Vitals and nursing note reviewed.  Constitutional:      General: He is not in acute distress.    Appearance: He is well-developed. He is not ill-appearing.  HENT:     Head: Normocephalic and atraumatic.     Nose: Nose normal.     Mouth/Throat:      Mouth: Mucous membranes are moist.  Eyes:     Extraocular Movements: Extraocular movements intact.     Conjunctiva/sclera: Conjunctivae normal.     Pupils: Pupils are equal, round, and reactive to light.  Cardiovascular:     Rate and Rhythm: Normal rate and regular rhythm.     Pulses: Normal pulses.     Heart sounds: Normal heart sounds. No murmur heard. Pulmonary:     Effort: Pulmonary effort is normal. No respiratory distress.     Breath sounds: Normal breath sounds.  Abdominal:     Palpations: Abdomen is soft.     Tenderness: There is no abdominal tenderness.  Musculoskeletal:        General: No swelling.     Cervical back: Normal range of motion and neck supple.  Skin:    General: Skin is warm and dry.     Capillary Refill: Capillary refill takes less than 2 seconds.  Neurological:     General: No focal deficit present.     Mental Status: He is alert.     Comments: Patient is somnolent but arousable, seems to have normal strength  Psychiatric:        Mood and Affect: Mood normal.     ED Results / Procedures / Treatments   Labs (all labs ordered  are listed, but only abnormal results are displayed) Labs Reviewed  CBC WITH DIFFERENTIAL/PLATELET - Abnormal; Notable for the following components:      Result Value   RBC 3.82 (*)    Hemoglobin 12.8 (*)    HCT 36.6 (*)    Platelets 87 (*)    Lymphs Abs 0.5 (*)    All other components within normal limits  COMPREHENSIVE METABOLIC PANEL - Abnormal; Notable for the following components:   Glucose, Bld 212 (*)    Total Bilirubin 2.1 (*)    All other components within normal limits  ACETAMINOPHEN LEVEL - Abnormal; Notable for the following components:   Acetaminophen (Tylenol), Serum <10 (*)    All other components within normal limits  SALICYLATE LEVEL - Abnormal; Notable for the following components:   Salicylate Lvl <7.0 (*)    All other components within normal limits  CBG MONITORING, ED - Abnormal; Notable for the  following components:   Glucose-Capillary 212 (*)    All other components within normal limits  ETHANOL  RAPID URINE DRUG SCREEN, HOSP PERFORMED    EKG EKG Interpretation  Date/Time:  Monday Dec 05 2022 11:40:44 EDT Ventricular Rate:  103 PR Interval:    QRS Duration: 105 QT Interval:  415 QTC Calculation: 536 R Axis:   -28 Text Interpretation: Atrial fibrillation Borderline left axis deviation Prolonged QT interval Confirmed by Virgina Norfolk ([id number]) on 12/05/2022 11:55:51 AM  Radiology CT Head Wo Contrast  Result Date: 12/05/2022 CLINICAL DATA:  Altered mental status. EXAM: CT HEAD WITHOUT CONTRAST TECHNIQUE: Contiguous axial images were obtained from the base of the skull through the vertex without intravenous contrast. RADIATION DOSE REDUCTION: This exam was performed according to the departmental dose-optimization program which includes automated exposure control, adjustment of the mA and/or kV according to patient size and/or use of iterative reconstruction technique. COMPARISON:  CT Head 11/11/21 FINDINGS: Brain: No evidence of acute infarction, hemorrhage, hydrocephalus, extra-axial collection or mass lesion/mass effect. Vascular: No hyperdense vessel or unexpected calcification. Skull: Normal. Negative for fracture or focal lesion. Sinuses/Orbits: No middle ear or mastoid effusion. Paranasal sinuses are notable for mucosal thickening right maxillary sinus. Orbits are unremarkable. Other: None. IMPRESSION: No acute intracranial abnormality. Electronically Signed   By: Lorenza Cambridge M.D.   On: 12/05/2022 13:01   DG Chest Portable 1 View  Result Date: 12/05/2022 CLINICAL DATA:  Altered mental status EXAM: PORTABLE CHEST 1 VIEW COMPARISON:  Portable exam 1215 hours compared to 09/11/2022 FINDINGS: Enlargement of cardiac silhouette. Mediastinal contours and pulmonary vascularity normal. Near-complete resolution of previously identified RIGHT upper lobe infiltrate. Minimal bibasilar  atelectasis. No new consolidation, pleural effusion, or pneumothorax. Osseous structures unremarkable. IMPRESSION: Minimal bibasilar atelectasis. Improved RIGHT upper lobe pneumonia since previous study. Electronically Signed   By: [name] M.D.   On: 12/05/2022 12:31    Procedures Procedures    Medications Ordered in ED Medications  sodium chloride 0.9 % bolus 1,000 mL (0 mLs Intravenous Stopped 12/05/22 1503)    ED Course/ Medical Decision Making/ A&P                             Medical Decision Making Amount and/or Complexity of Data Reviewed Labs: ordered. Radiology: ordered.   HANZEL MAGRINI is here after ingestion of Seroquel with unknown intent.  He did hint to me that this is likely an intent to self-harm.  He took he says about 900 mg of Seroquel  last night.  He is somnolent but his neuroexam is unremarkable.  Medical clearance labs were drawn and are unremarkable per my review and interpretation.  Head CT is normal.  He does not admit to any other alcohol or drug use.  EKG shows rate controlled atrial fibrillation.  He is not on anticoagulation due to his social issues.  Overall I do suspect that this was an attempt of self-harm.  History of the same.  Will have him talk with psychiatry.  He is medically cleared at this time.  This chart was dictated using voice recognition software.  Despite best efforts to proofread,  errors can occur which can change the documentation meaning.         Final Clinical Impression(s) / ED Diagnoses Final diagnoses:  Overdose of undetermined intent, initial encounter    Rx / DC Orders ED Discharge Orders     None         Virgina Norfolk, DO 12/05/22 1503

## 2022-12-05 NOTE — Progress Notes (Signed)
TOC CSW was able to get taxi voucher for pt to [employer] where his wheelchair is.  Milam Allbaugh Tarpley-Carter, MSW, LCSW-A Pronouns:  She/Her/Hers [facility] HealthTransitions of Care Clinical Social Worker Direct Number:  [phone] [email or web address]

## 2022-12-05 NOTE — ED Notes (Signed)
Pt is unable to transfer him self to the wheelchair. Pt requires max assist for transfers

## 2022-12-05 NOTE — ED Triage Notes (Signed)
Pt from outside [employer] after bystanders called EMS for his AMS. Pt reports he took 900 mg Seroquel "sometime last night." He is lethargic and falling asleep unless being stimulated. EMS left his wheelchair with the staff inside [employer]. Denies SI.

## 2022-12-06 LAB — CBG MONITORING, ED
Glucose-Capillary: 122 mg/dL — ABNORMAL HIGH (ref 70–99)
Glucose-Capillary: 140 mg/dL — ABNORMAL HIGH (ref 70–99)
Glucose-Capillary: 122 mg/dL — ABNORMAL HIGH (ref 70–99)

## 2022-12-06 NOTE — ED Notes (Signed)
Pt spilled urinal in the bed and in the floor. This RN attempted to change the pts linen and get the bed cleaned up. Pt refused.

## 2022-12-06 NOTE — ED Notes (Signed)
Patient's wheelchair was delivered to ED. Patient left via wheelchair, patient provided with bus pass

## 2022-12-06 NOTE — ED Notes (Signed)
[facility] called, will not deliver to a [employer].  [facility] called, short-staffed w/a few out of [place] trips, try after 5pm today. [employer] called.  Agreed to take patient to library to pick up his wheelchair. ETA: "It will be a while".

## 2022-12-06 NOTE — Progress Notes (Signed)
CSW contacted the [employer]. CSW verified patients wheelchair is still there. CSW will arrange for the wheelchair to be picked up and brought to the hospital.

## 2022-12-06 NOTE — Progress Notes (Signed)
Per psych provider Eligha Bridegroom, NP pt has been pysch cleared. TOC consult was entered and TOC CSW has followed up. This CSW will now remove this pt from the [facility] shift report.    Maryjean Ka, MSW, LCSWA 12/06/2022 12:20 AM

## 2022-12-06 NOTE — ED Provider Notes (Signed)
Emergency Medicine Observation Re-evaluation Note  Cory Palmer is a 62 y.o. male, seen on rounds today.  Pt initially presented to the ED for complaints of history substance use disorder. No current c/o, awaiting transport.  Physical Exam  BP 102/65 (BP Location: Right Arm)   Pulse 70   Temp 97.8 F (36.6 C)   Resp 18   SpO2 94%  Physical Exam General: resting.  Cardiac: regular rate.  Lungs: breathing comfortably. Psych: resting, calm.   ED Course / MDM    I have reviewed the labs performed to date as well as medications administered while in observation.  Recent changes in the last 24 hours include ED obs, reassessment.   Plan  Pt has been evaluated by [facility] team and psych cleared for d/c.   Patients wheelchair at [facility] - to transport there when open this AM.  Will also provide referrals and encourage to f/u with SUD tx program. Other social service resources provided.       Cathren Laine, MD 12/06/22 [phone]

## 2022-12-06 NOTE — ED Notes (Addendum)
[facility]/911 called - will not take patient to [facility]. Informed RN.

## 2022-12-13 ENCOUNTER — Observation Stay (HOSPITAL_COMMUNITY)
Admission: EM | Admit: 2022-12-13 | Discharge: 2022-12-14 | Disposition: A | Discharge status: Home / Self Care | Payer: 59 | Attending: Internal Medicine | Admitting: Internal Medicine

## 2022-12-13 ENCOUNTER — Encounter (HOSPITAL_COMMUNITY): Payer: Self-pay

## 2022-12-13 ENCOUNTER — Other Ambulatory Visit: Payer: Self-pay

## 2022-12-13 ENCOUNTER — Emergency Department (HOSPITAL_COMMUNITY): Payer: 59

## 2022-12-13 DIAGNOSIS — K7460 Unspecified cirrhosis of liver: Secondary | ICD-10-CM | POA: Diagnosis present

## 2022-12-13 DIAGNOSIS — I10 Essential (primary) hypertension: Secondary | ICD-10-CM | POA: Diagnosis not present

## 2022-12-13 DIAGNOSIS — I2699 Other pulmonary embolism without acute cor pulmonale: Secondary | ICD-10-CM | POA: Diagnosis not present

## 2022-12-13 DIAGNOSIS — E119 Type 2 diabetes mellitus without complications: Secondary | ICD-10-CM | POA: Insufficient documentation

## 2022-12-13 DIAGNOSIS — Z89512 Acquired absence of left leg below knee: Secondary | ICD-10-CM | POA: Insufficient documentation

## 2022-12-13 DIAGNOSIS — R0602 Shortness of breath: Secondary | ICD-10-CM | POA: Diagnosis not present

## 2022-12-13 DIAGNOSIS — Z1152 Encounter for screening for COVID-19: Secondary | ICD-10-CM | POA: Insufficient documentation

## 2022-12-13 DIAGNOSIS — I2609 Other pulmonary embolism with acute cor pulmonale: Principal | ICD-10-CM | POA: Insufficient documentation

## 2022-12-13 DIAGNOSIS — D696 Thrombocytopenia, unspecified: Secondary | ICD-10-CM | POA: Diagnosis not present

## 2022-12-13 DIAGNOSIS — K219 Gastro-esophageal reflux disease without esophagitis: Secondary | ICD-10-CM | POA: Diagnosis present

## 2022-12-13 DIAGNOSIS — Z7984 Long term (current) use of oral hypoglycemic drugs: Secondary | ICD-10-CM | POA: Diagnosis not present

## 2022-12-13 DIAGNOSIS — Z89432 Acquired absence of left foot: Secondary | ICD-10-CM | POA: Insufficient documentation

## 2022-12-13 DIAGNOSIS — Z86711 Personal history of pulmonary embolism: Secondary | ICD-10-CM | POA: Diagnosis present

## 2022-12-13 DIAGNOSIS — I480 Paroxysmal atrial fibrillation: Secondary | ICD-10-CM | POA: Diagnosis present

## 2022-12-13 DIAGNOSIS — F319 Bipolar disorder, unspecified: Secondary | ICD-10-CM | POA: Diagnosis present

## 2022-12-13 DIAGNOSIS — E876 Hypokalemia: Secondary | ICD-10-CM | POA: Insufficient documentation

## 2022-12-13 DIAGNOSIS — R0789 Other chest pain: Secondary | ICD-10-CM | POA: Diagnosis present

## 2022-12-13 DIAGNOSIS — F17210 Nicotine dependence, cigarettes, uncomplicated: Secondary | ICD-10-CM | POA: Insufficient documentation

## 2022-12-13 DIAGNOSIS — Z720 Tobacco use: Secondary | ICD-10-CM

## 2022-12-13 LAB — CBC WITH DIFFERENTIAL/PLATELET
Abs Immature Granulocytes: 0.04 10*3/uL (ref 0.00–0.07)
Basophils Absolute: 0 10*3/uL (ref 0.0–0.1)
Basophils Relative: 0 %
Eosinophils Absolute: 0.2 10*3/uL (ref 0.0–0.5)
Eosinophils Relative: 2 %
HCT: 37.5 % — ABNORMAL LOW (ref 39.0–52.0)
Hemoglobin: 12.6 g/dL — ABNORMAL LOW (ref 13.0–17.0)
Immature Granulocytes: 0 %
Lymphocytes Relative: 15 %
Lymphs Abs: 1.4 10*3/uL (ref 0.7–4.0)
MCH: 32 pg (ref 26.0–34.0)
MCHC: 33.6 g/dL (ref 30.0–36.0)
MCV: 95.2 fL (ref 80.0–100.0)
Monocytes Absolute: 0.8 10*3/uL (ref 0.1–1.0)
Monocytes Relative: 8 %
Neutro Abs: 7 10*3/uL (ref 1.7–7.7)
Neutrophils Relative %: 75 %
Platelets: 99 10*3/uL — ABNORMAL LOW (ref 150–400)
RBC: 3.94 MIL/uL — ABNORMAL LOW (ref 4.22–5.81)
RDW: 12.9 % (ref 11.5–15.5)
WBC: 9.4 10*3/uL (ref 4.0–10.5)
nRBC: 0 % (ref 0.0–0.2)

## 2022-12-13 LAB — HEPATIC FUNCTION PANEL
ALT: 13 U/L (ref 0–44)
AST: 20 U/L (ref 15–41)
Albumin: 3.4 g/dL — ABNORMAL LOW (ref 3.5–5.0)
Alkaline Phosphatase: 94 U/L (ref 38–126)
Bilirubin, Direct: 0.6 mg/dL — ABNORMAL HIGH (ref 0.0–0.2)
Indirect Bilirubin: 1.3 mg/dL — ABNORMAL HIGH (ref 0.3–0.9)
Total Bilirubin: 1.9 mg/dL — ABNORMAL HIGH (ref 0.3–1.2)
Total Protein: 6.5 g/dL (ref 6.5–8.1)

## 2022-12-13 LAB — RESP PANEL BY RT-PCR (RSV, FLU A&B, COVID)  RVPGX2
Influenza A by PCR: NEGATIVE
Influenza B by PCR: NEGATIVE
Resp Syncytial Virus by PCR: NEGATIVE
SARS Coronavirus 2 by RT PCR: NEGATIVE

## 2022-12-13 LAB — BASIC METABOLIC PANEL
Anion gap: 10 (ref 5–15)
BUN: 17 mg/dL (ref 8–23)
CO2: 23 mmol/L (ref 22–32)
Calcium: 9 mg/dL (ref 8.9–10.3)
Chloride: 102 mmol/L (ref 98–111)
Creatinine, Ser: 0.76 mg/dL (ref 0.61–1.24)
GFR, Estimated: >60 mL/min (ref >60)
Glucose, Bld: 140 mg/dL — ABNORMAL HIGH (ref 70–99)
Potassium: 3.5 mmol/L (ref 3.5–5.1)
Sodium: 135 mmol/L (ref 135–145)

## 2022-12-13 LAB — MAGNESIUM: Magnesium: 1.6 mg/dL — ABNORMAL LOW (ref 1.7–2.4)

## 2022-12-13 LAB — TROPONIN I (HIGH SENSITIVITY)
Troponin I (High Sensitivity): 3 ng/L (ref <18)
Troponin I (High Sensitivity): 4 ng/L (ref <18)

## 2022-12-13 LAB — LIPASE, BLOOD: Lipase: 39 U/L (ref 11–51)

## 2022-12-13 LAB — CBG MONITORING, ED: Glucose-Capillary: 135 mg/dL — ABNORMAL HIGH (ref 70–99)

## 2022-12-13 MED ORDER — IOHEXOL 350 MG/ML SOLN
75.0000 mL | Freq: Once | INTRAVENOUS | Status: AC | PRN
  Administered 2022-12-13: 75 mL via INTRAVENOUS

## 2022-12-13 MED ORDER — HEPARIN (PORCINE) 25000 UT/250ML-% IV SOLN
1600.0000 [IU]/h | INTRAVENOUS | Status: DC
  Administered 2022-12-13: 1600 [IU]/h via INTRAVENOUS
  Filled 2022-12-13: qty 250

## 2022-12-13 MED ORDER — HEPARIN BOLUS VIA INFUSION
5000.0000 [IU] | Freq: Once | INTRAVENOUS | Status: AC
  Administered 2022-12-13: 5000 [IU] via INTRAVENOUS
  Filled 2022-12-13: qty 5000

## 2022-12-13 MED ORDER — LIDOCAINE VISCOUS HCL 2 % MT SOLN
15.0000 mL | Freq: Once | OROMUCOSAL | Status: AC
  Administered 2022-12-13: 15 mL via ORAL
  Filled 2022-12-13: qty 15

## 2022-12-13 MED ORDER — LACTATED RINGERS IV BOLUS
500.0000 mL | Freq: Once | INTRAVENOUS | Status: AC
  Administered 2022-12-13: 500 mL via INTRAVENOUS

## 2022-12-13 MED ORDER — LACTULOSE 10 GM/15ML PO SOLN: 10.0000 g | Freq: Two times a day (BID) | ORAL | Status: DC | PRN

## 2022-12-13 MED ORDER — ACETAMINOPHEN 650 MG RE SUPP: 650.0000 mg | Freq: Four times a day (QID) | RECTAL | Status: DC | PRN

## 2022-12-13 MED ORDER — INSULIN ASPART 100 UNIT/ML IJ SOLN
0.0000 [IU] | Freq: Three times a day (TID) | INTRAMUSCULAR | Status: DC
  Administered 2022-12-14 (×2): 1 [IU] via SUBCUTANEOUS

## 2022-12-13 MED ORDER — ACETAMINOPHEN 325 MG PO TABS: 650.0000 mg | ORAL_TABLET | Freq: Four times a day (QID) | ORAL | Status: DC | PRN

## 2022-12-13 MED ORDER — TRAZODONE HCL 50 MG PO TABS
150.0000 mg | ORAL_TABLET | Freq: Every day | ORAL | Status: DC
  Administered 2022-12-14: 150 mg via ORAL
  Filled 2022-12-13: qty 3

## 2022-12-13 MED ORDER — PROCHLORPERAZINE EDISYLATE 10 MG/2ML IJ SOLN: 10.0000 mg | Freq: Four times a day (QID) | INTRAMUSCULAR | Status: DC | PRN

## 2022-12-13 MED ORDER — NICOTINE 14 MG/24HR TD PT24
14.0000 mg | MEDICATED_PATCH | Freq: Every day | TRANSDERMAL | Status: DC
  Administered 2022-12-14: 14 mg via TRANSDERMAL
  Filled 2022-12-13: qty 1

## 2022-12-13 MED ORDER — GABAPENTIN 300 MG PO CAPS
300.0000 mg | ORAL_CAPSULE | Freq: Three times a day (TID) | ORAL | Status: DC
  Administered 2022-12-14 (×2): 300 mg via ORAL
  Filled 2022-12-13 (×2): qty 1

## 2022-12-13 MED ORDER — FAMOTIDINE IN NACL 20-0.9 MG/50ML-% IV SOLN
20.0000 mg | Freq: Once | INTRAVENOUS | Status: AC
  Administered 2022-12-13: 20 mg via INTRAVENOUS
  Filled 2022-12-13: qty 50

## 2022-12-13 MED ORDER — ALUM & MAG HYDROXIDE-SIMETH 200-200-20 MG/5ML PO SUSP
30.0000 mL | Freq: Once | ORAL | Status: AC
  Administered 2022-12-13: 30 mL via ORAL
  Filled 2022-12-13: qty 30

## 2022-12-13 MED ORDER — MAGNESIUM SULFATE 2 GM/50ML IV SOLN
2.0000 g | Freq: Once | INTRAVENOUS | Status: AC
  Administered 2022-12-13: 2 g via INTRAVENOUS
  Filled 2022-12-13: qty 50

## 2022-12-13 MED ORDER — PANTOPRAZOLE SODIUM 40 MG PO TBEC
40.0000 mg | DELAYED_RELEASE_TABLET | Freq: Two times a day (BID) | ORAL | Status: DC
  Administered 2022-12-14 (×2): 40 mg via ORAL
  Filled 2022-12-13 (×2): qty 1

## 2022-12-13 MED ORDER — QUETIAPINE FUMARATE 100 MG PO TABS
300.0000 mg | ORAL_TABLET | Freq: Two times a day (BID) | ORAL | Status: DC
  Administered 2022-12-14 (×2): 300 mg via ORAL
  Filled 2022-12-13 (×2): qty 3

## 2022-12-13 MED ORDER — SODIUM CHLORIDE 0.9% FLUSH
3.0000 mL | Freq: Two times a day (BID) | INTRAVENOUS | Status: DC
  Administered 2022-12-14 (×2): 3 mL via INTRAVENOUS

## 2022-12-13 NOTE — H&P (Signed)
History and Physical    WELCOME Cory Palmer:[id number] DOB: [birth year] DOA: 12/13/2022  PCP: System, Provider Not In  Patient coming from: Homeless  I have personally briefly reviewed patient's old medical records in [facility]  Chief Complaint: Chest pain and cough  HPI: Cory Palmer is a 62 y.o. male with medical history significant for PAF not on anticoagulation, hepatic cirrhosis, T2DM, PUD, chronic thrombocytopenia, bipolar disorder, depression/anxiety, s/p bilateral BKA, tobacco use, and homelessness who presented to the ED for evaluation of chest pain and cough.  Patient reports 2 days of chest pain, dyspnea, cough.  He is homeless and says he is currently staying outside [employer].  He was seen in the ED on 5/6 for intentional Seroquel overdose.  He was cleared by psychiatry and discharged from the ED.  He reports smoking 0.5 PPD.  He reports drinking a beer occasionally but no regular or daily use.  ED Course  Labs/Imaging on admission: I have personally reviewed following labs and imaging studies.  Initial vitals showed BP 122/74 pulse 96, RR 15, temp 98.6 F, SpO2 100% on room air.  Labs show WBC 9.4, hemoglobin 12.6, platelets 99,000, sodium 135, potassium 3.5, bicarb 23, BUN 17, creatinine 0.76, serum glucose 140, AST 20, ALT 13, alk phos 94, T. bili 1.9, lipase 39, magnesium 1.6, troponin negative x 2.  SARS-CoV-2, influenza, RSV PCR negative.  CTA chest shows small PE involving posterior lower lobe branch of the right pulmonary artery with very mild right heart strain per CT imaging.  Mild to moderate severity right upper lobe scarring or atelectasis noted.  Right greater than left bilateral lower lobe atelectasis also seen.  Patient was given IV magnesium 2 g, 500 cc LR, and started on IV heparin.  The hospitalist service was consulted to admit for further evaluation and management.  Review of Systems: All systems reviewed and are negative except as documented in  history of present illness above.   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    Past Surgical History:  Procedure Laterality Date   AMPUTATION Left 09/28/2018   Procedure: LEFT TRANSMETATARSAL AMPUTATION;  Surgeon: Nadara Mustard, MD;  Location: [facility] OR;  Service: Orthopedics;  Laterality: Left;   AMPUTATION Left 10/23/2019   Procedure: AMPUTATION BELOW KNEE;  Surgeon: Nadara Mustard, MD;  Location: [facility] OR;  Service: Orthopedics;  Laterality: Left;   BELOW KNEE LEG AMPUTATION Right    ESOPHAGOGASTRODUODENOSCOPY (EGD) WITH PROPOFOL N/A 10/19/2020   Procedure: ESOPHAGOGASTRODUODENOSCOPY (EGD) WITH PROPOFOL;  Surgeon: Charna Elizabeth, MD;  Location: [facility] ENDOSCOPY;  Service: Endoscopy;  Laterality: N/A;   TEE WITHOUT CARDIOVERSION N/A 10/25/2019   Procedure: TRANSESOPHAGEAL ECHOCARDIOGRAM (TEE);  Surgeon: Lewayne Bunting, MD;  Location: [facility] ENDOSCOPY;  Service: Cardiovascular;  Laterality: N/A;    Social History:  reports that he has been smoking cigarettes. He has been smoking an average of .25 packs per day. He has never used smokeless tobacco. He reports that he does not currently use alcohol. He reports that he does not currently use drugs after having used the following drugs: Cocaine.  Allergies  Allergen Reactions  Atorvastatin Calcium Nausea And Vomiting   Gemfibrozil     Unknown   Insulin Glargine     Unknown    Family History  Problem Relation Age of Onset   Hypertension Other    Diabetes Mellitus II Other      Prior to Admission medications   Medication Sig Start Date End Date Taking? Authorizing Provider  amoxicillin-clavulanate (AUGMENTIN)  875-125 MG tablet Take 1 tablet by mouth every 12 (twelve) hours. Patient not taking: Reported on 12/06/2022 09/13/22   Al Decant, PA-C  benzonatate (TESSALON) 100 MG capsule Take 1 capsule (100 mg total) by mouth every 8 (eight) hours as needed for cough. Patient not taking: Reported on 12/06/2022 09/13/22   Al Decant, PA-C  gabapentin (NEURONTIN) 300 MG capsule Take 1 capsule (300 mg) by mouth 3 (three) times daily. 10/10/22   Benjiman Core, MD  metFORMIN (GLUCOPHAGE) 500 MG tablet Take 1 tablet (500 mg total) by mouth 2 (two) times daily with a meal. 08/30/22   Marlin Canary U, DO  pantoprazole (PROTONIX) 40 MG tablet Take 40 mg by mouth daily. 10/18/22   [provider]  QUEtiapine (SEROQUEL) 300 MG tablet Take 300 mg by mouth 2 (two) times daily. 11/25/22   [provider]  traZODone (DESYREL) 150 MG tablet Take 150 mg by mouth at bedtime. 11/25/22   [provider]    Physical Exam: Vitals:   12/13/22 2031 12/13/22 2200 12/13/22 2300 12/13/22 2316  BP:  110/71 106/64   Pulse:  80 79   Resp:  17 20   Temp: 99.5 F (37.5 C)   97.8 F (36.6 C)  TempSrc: Oral   Oral  SpO2:  94% 96%   Weight:      Height:       Constitutional: Resting supine in bed, NAD, calm Eyes: EOMI, lids and conjunctivae normal ENMT: Mucous membranes are moist. Posterior pharynx clear of any exudate or lesions.Normal dentition.  Neck: normal, supple, no masses. Respiratory: clear to auscultation bilaterally, no wheezing, no crackles. Normal respiratory effort. No accessory muscle use.  Cardiovascular: Likely irregular, no murmurs / rubs / gallops.  Abdomen: no tenderness, no masses palpated.  Musculoskeletal: Bilateral BKA no clubbing / cyanosis. No joint deformity upper and lower extremities. Good ROM, no contractures. Normal muscle tone.  Skin: no rashes, lesions, ulcers. No induration Neurologic: Sensation intact. Strength 5/5 in all 4.  Psychiatric: Alert and  oriented x 3.  EKG: Personally reviewed. Atrial fibrillation, rate 101.  Similar to prior.  Assessment/Plan Principal Problem:   Acute pulmonary embolism (HCC) Active Problems:   PAF (paroxysmal atrial fibrillation) (HCC)   Cirrhosis of liver (HCC)   Thrombocytopenia (HCC)   Depressed bipolar disorder (HCC)   Hypomagnesemia   GERD (gastroesophageal reflux disease)   Tobacco use   Cory Palmer is a 62 y.o. male with medical history significant for PAF not on anticoagulation, hepatic cirrhosis, T2DM, PUD, chronic thrombocytopenia, bipolar disorder, depression/anxiety, s/p bilateral BKA, tobacco use, and homelessness who is admitted with pulmonary embolism.  Assessment and Plan: Pulmonary embolism: CT shows small PE involving posterior lower lobe branch of right pulmonary artery.  There is CT evidence of mild right heart strain.  He has been started on IV heparin.  Long-term anticoagulation will be difficult due to adherence and social issues. -Continue IV heparin -Obtain echocardiogram  Paroxysmal atrial fibrillation: Remains in atrial fibrillation with controlled rate.  CHA2DS2-VASc score is 2.  He has not been on anticoagulation prior  to admission due to social issues and poor medical compliance. -On IV heparin as above -Not on rate/rhythm controlling meds as an outpatient  Hepatic cirrhosis: Currently stable.  Start lactulose as needed for bowel regimen.  Hypomagnesemia: IV magnesium given.  Repeat labs in AM.  GERD with history of gastritis and duodenal ulcers: Continue Protonix.  Chronic thrombocytopenia: Stable without obvious bleeding.  Monitor closely while on anticoagulation.  Type 2 diabetes: Hold metformin, placed on SSI.  Bipolar disorder/depression/anxiety: Seroquel and trazodone.  S/p bilateral BKA: Wheelchair dependent.  Tobacco use: Reports smoking half pack per day.  Nicotine patch ordered.  Homeless: Consult TOC.   DVT prophylaxis: IV  heparin Code Status: Full code Family Communication: Discussed with patient, he has no close family/contacts Disposition Plan: Homeless, dispo pending clinical progress Consults called: None Severity of Illness: The appropriate patient status for this patient is OBSERVATION. Observation status is judged to be reasonable and necessary in order to provide the required intensity of service to ensure the patient's safety. The patient's presenting symptoms, physical exam findings, and initial radiographic and laboratory data in the context of their medical condition is felt to place them at decreased risk for further clinical deterioration. Furthermore, it is anticipated that the patient will be medically stable for discharge from the hospital within 2 midnights of admission.   Darreld Mclean MD [facility]  If 7PM-7AM, please contact night-coverage [email or web address]  12/13/2022, 11:28 PM

## 2022-12-13 NOTE — ED Provider Notes (Signed)
[facility] EMERGENCY DEPARTMENT AT [facility] Provider Note   CSN: [id number] Arrival date & time: 12/13/22  1556     History  Chief Complaint  Patient presents with   Chest Pain   Emesis    Cory Palmer is a 62 y.o. male.   Chest Pain Associated symptoms: abdominal pain, cough, shortness of breath and vomiting   Emesis Associated symptoms: abdominal pain and cough   Patient resents for chest pain, shortness of breath, cough for the past 2 days.  Medical history includes DM, bipolar disorder, HTN, atrial fibrillation, BPH, bilateral BKA's, cirrhosis, anemia, GERD, polysubstance abuse.  He is currently homeless.  He was last seen in the ED 1 week ago for intentional Seroquel overdose.  Patient arrives by EMS.  He received 324 of ASA prior to arrival.  He was placed on supplemental oxygen for comfort.  Currently, he endorses pain in his epigastrium.  At times, pain will radiate to his upper back.     Home Medications Prior to Admission medications   Medication Sig Start Date End Date Taking? Authorizing Provider  gabapentin (NEURONTIN) 300 MG capsule Take 1 capsule (300 mg) by mouth 3 (three) times daily. 10/10/22   Benjiman Core, MD  metFORMIN (GLUCOPHAGE) 500 MG tablet Take 1 tablet (500 mg total) by mouth 2 (two) times daily with a meal. 08/30/22   Marlin Canary U, DO  pantoprazole (PROTONIX) 40 MG tablet Take 40 mg by mouth daily. 10/18/22   [provider]  QUEtiapine (SEROQUEL) 300 MG tablet Take 300 mg by mouth 2 (two) times daily. 11/25/22   [provider]  traZODone (DESYREL) 150 MG tablet Take 150 mg by mouth at bedtime. 11/25/22   [provider]      Allergies    Atorvastatin calcium, Gemfibrozil, and Insulin glargine    Review of Systems   Review of Systems  Respiratory:  Positive for cough and shortness of breath.   Cardiovascular:  Positive for chest pain.  Gastrointestinal:  Positive for abdominal pain and vomiting.   All other systems reviewed and are negative.   Physical Exam Updated Vital Signs BP 106/64   Pulse 79   Temp 97.8 F (36.6 C) (Oral)   Resp 20   Ht 6' (1.829 m)   Wt 99.8 kg   SpO2 96%   BMI 29.84 kg/m  Physical Exam Vitals and nursing note reviewed.  Constitutional:      General: He is not in acute distress.    Appearance: He is well-developed. He is not ill-appearing, toxic-appearing or diaphoretic.  HENT:     Head: Normocephalic and atraumatic.  Eyes:     Conjunctiva/sclera: Conjunctivae normal.  Cardiovascular:     Rate and Rhythm: Normal rate and regular rhythm.     Heart sounds: No murmur heard. Pulmonary:     Effort: Pulmonary effort is normal. No respiratory distress.     Breath sounds: Normal breath sounds. No decreased breath sounds, wheezing, rhonchi or rales.  Chest:     Chest wall: Tenderness present.  Abdominal:     Palpations: Abdomen is soft.     Tenderness: There is abdominal tenderness.  Musculoskeletal:        General: No swelling.     Cervical back: Normal range of motion and neck supple.     Comments: Bilateral BKA's  Skin:    General: Skin is warm and dry.     Capillary Refill: Capillary refill takes less than 2 seconds.  Coloration: Skin is not cyanotic or pale.  Neurological:     General: No focal deficit present.     Mental Status: He is alert and oriented to person, place, and time.  Psychiatric:        Mood and Affect: Mood normal.        Behavior: Behavior normal.     ED Results / Procedures / Treatments   Labs (all labs ordered are listed, but only abnormal results are displayed) Labs Reviewed  BASIC METABOLIC PANEL - Abnormal; Notable for the following components:      Result Value   Glucose, Bld 140 (*)    All other components within normal limits  MAGNESIUM - Abnormal; Notable for the following components:   Magnesium 1.6 (*)    All other components within normal limits  HEPATIC FUNCTION PANEL - Abnormal; Notable for  the following components:   Albumin 3.4 (*)    Total Bilirubin 1.9 (*)    Bilirubin, Direct 0.6 (*)    Indirect Bilirubin 1.3 (*)    All other components within normal limits  CBC WITH DIFFERENTIAL/PLATELET - Abnormal; Notable for the following components:   RBC 3.94 (*)    Hemoglobin 12.6 (*)    HCT 37.5 (*)    Platelets 99 (*)    All other components within normal limits  CBG MONITORING, ED - Abnormal; Notable for the following components:   Glucose-Capillary 135 (*)    All other components within normal limits  RESP PANEL BY RT-PCR (RSV, FLU A&B, COVID)  RVPGX2  LIPASE, BLOOD  URINALYSIS, ROUTINE W REFLEX MICROSCOPIC  HEPARIN LEVEL (UNFRACTIONATED)  HIV ANTIBODY (ROUTINE TESTING W REFLEX)  MAGNESIUM  COMPREHENSIVE METABOLIC PANEL  CBC  TROPONIN I (HIGH SENSITIVITY)  TROPONIN I (HIGH SENSITIVITY)    EKG EKG Interpretation  Date/Time:  Tuesday Dec 13 2022 16:14:17 EDT Ventricular Rate:  101 PR Interval:    QRS Duration: 104 QT Interval:  386 QTC Calculation: 501 R Axis:   8 Text Interpretation: Atrial fibrillation Borderline T abnormalities, inferior leads Prolonged QT interval Confirmed by Gloris Manchester ([id number]) on 12/13/2022 4:15:50 PM  Radiology CT Angio Chest PE W and/or Wo Contrast  Result Date: 12/13/2022 CLINICAL DATA:  Suspected pulmonary embolism. EXAM: CT ANGIOGRAPHY CHEST WITH CONTRAST TECHNIQUE: Multidetector CT imaging of the chest was performed using the standard protocol during bolus administration of intravenous contrast. Multiplanar CT image reconstructions and MIPs were obtained to evaluate the vascular anatomy. RADIATION DOSE REDUCTION: This exam was performed according to the departmental dose-optimization program which includes automated exposure control, adjustment of the mA and/or kV according to patient size and/or use of iterative reconstruction technique. CONTRAST:  75mL OMNIPAQUE IOHEXOL 350 MG/ML SOLN COMPARISON:  August 27, 2022 FINDINGS:  Cardiovascular: The thoracic aorta is normal in appearance. Satisfactory opacification of the pulmonary arteries to the segmental level. A small amount of intraluminal low attenuation is seen involving a posterior lower lobe branch of the right pulmonary artery (axial CT images 95 through 97, CT series 5). There is mild cardiomegaly with moderate to marked severity coronary artery calcification. Very mild right heart straight is seen (RV/LV ratio of 1.03) no pericardial effusion. Mediastinum/Nodes: No enlarged mediastinal, hilar, or axillary lymph nodes. Thyroid gland, trachea, and esophagus demonstrate no significant findings. Lungs/Pleura: Mild to moderate severity right upper lobe scarring, atelectasis and/or infiltrate is seen. This is markedly decreased in severity when compared to the prior study. Mild lingular and mild to moderate severity posterior bilateral lower lobe atelectasis  is seen, right greater than left. No pleural effusion or pneumothorax is identified. Upper Abdomen: The liver is cirrhotic in appearance. Musculoskeletal: No chest wall abnormality. No acute or significant osseous findings. Review of the MIP images confirms the above findings. IMPRESSION: 1. Small amount of pulmonary embolism involving a posterior lower lobe branch of the right pulmonary artery with very mild right heart strain. 2. Mild to moderate severity right upper lobe scarring, atelectasis and/or infiltrate, markedly decreased in severity when compared to the prior study. 3. Mild lingular and mild to moderate severity posterior bilateral lower lobe atelectasis, right greater than left. 4. Cardiomegaly with moderate to marked severity coronary artery calcification. 5. Cirrhosis. Electronically Signed   By: Aram Candela M.D.   On: 12/13/2022 21:40   DG Chest Portable 1 View  Result Date: 12/13/2022 CLINICAL DATA:  Shortness of breath EXAM: PORTABLE CHEST 1 VIEW COMPARISON:  12/05/2022 and prior studies FINDINGS: The  cardiomediastinal silhouette is unremarkable. Mild streaky opacities in the LEFT LOWER lung noted which may represent atelectasis or less likely infection. There is no evidence of mass, pleural effusion or pneumothorax. No acute bony abnormalities are present. IMPRESSION: Mild streaky LEFT LOWER lung opacities which may represent atelectasis or less likely infection. Electronically Signed   By: Harmon Pier M.D.   On: 12/13/2022 16:31    Procedures Procedures    Medications Ordered in ED Medications  heparin ADULT infusion 100 units/mL (25000 units/212mL) (1,600 Units/hr Intravenous New Bag/Given 12/13/22 2242)  sodium chloride flush (NS) 0.9 % injection 3 mL (has no administration in time range)  prochlorperazine (COMPAZINE) injection 10 mg (has no administration in time range)  acetaminophen (TYLENOL) tablet 650 mg (has no administration in time range)    Or  acetaminophen (TYLENOL) suppository 650 mg (has no administration in time range)  pantoprazole (PROTONIX) EC tablet 40 mg (has no administration in time range)  lactulose (CHRONULAC) 10 GM/15ML solution 10 g (has no administration in time range)  nicotine (NICODERM CQ - dosed in mg/24 hours) patch 14 mg (has no administration in time range)  insulin aspart (novoLOG) injection 0-9 Units (has no administration in time range)  traZODone (DESYREL) tablet 150 mg (has no administration in time range)  gabapentin (NEURONTIN) capsule 300 mg (has no administration in time range)  QUEtiapine (SEROQUEL) tablet 300 mg (has no administration in time range)  alum & mag hydroxide-simeth (MAALOX/MYLANTA) 200-200-20 MG/5ML suspension 30 mL (30 mLs Oral Given 12/13/22 1717)    And  lidocaine (XYLOCAINE) 2 % viscous mouth solution 15 mL (15 mLs Oral Given 12/13/22 1717)  famotidine (PEPCID) IVPB 20 mg premix (0 mg Intravenous Stopped 12/13/22 1917)  lactated ringers bolus 500 mL (0 mLs Intravenous Stopped 12/13/22 1942)  magnesium sulfate IVPB 2 g 50 mL (0 g  Intravenous Stopped 12/13/22 2054)  iohexol (OMNIPAQUE) 350 MG/ML injection 75 mL (75 mLs Intravenous Contrast Given 12/13/22 2125)  heparin bolus via infusion 5,000 Units (5,000 Units Intravenous Bolus from Bag 12/13/22 2242)    ED Course/ Medical Decision Making/ A&P                             Medical Decision Making Amount and/or Complexity of Data Reviewed Labs: ordered. Radiology: ordered.  Risk OTC drugs. Prescription drug management. Decision regarding hospitalization.   This patient presents to the ED for concern of chest pain, cough, shortness of breath, this involves an extensive number of treatment options, and is a  complaint that carries with it a high risk of complications and morbidity.  The differential diagnosis includes pneumonia, URI, reactive airway disease, CHF, ACS, PE   Co morbidities that complicate the patient evaluation  DM, bipolar disorder, HTN, atrial fibrillation, BPH, bilateral BKA's, cirrhosis, anemia, GERD, polysubstance abuse   Additional history obtained:  Additional history obtained from EMS External records from outside source obtained and reviewed including EMR   Lab Tests:  I Ordered, and personally interpreted labs.  The pertinent results include: Hypomagnesemia is present with otherwise normal electrolytes.  Hemoglobin is baseline.  No leukocytosis is present.  Thrombocytopenia is baseline.  Troponins are normal x 2.   Imaging Studies ordered:  I ordered imaging studies including chest x-ray, CTA chest I independently visualized and interpreted imaging which showed right-sided PEs with mild right heart strain I agree with the radiologist interpretation   Cardiac Monitoring: / EKG:  The patient was maintained on a cardiac monitor.  I personally viewed and interpreted the cardiac monitored which showed an underlying rhythm of: Sinus rhythm   Problem List / ED Course / Critical interventions / Medication management  Patient  presents for 2 days of cough, chest pain, shortness of breath.  Symptoms worsened today prompting him to call EMS.  Patient received 324 of ASA prior to arrival.  On arrival, patient is breathing is unlabored.  Lungs are clear to auscultation.  SpO2 is normal on room air.  He endorses some pain in the area of his epigastrium.  There is some mild tenderness associated.  GI cocktail was ordered.  EKG does not show any concerning ST segment elevations.  Laboratory workup was initiated.  Lab work was notable for hypomagnesemia.  Replacement magnesium was ordered.  Lab work was otherwise unremarkable.  CTA of chest revealed PE with very mild heart strain.  Patient denies any history of PE.  Heparin was initiated.  Patient was admitted to medicine for further management. I ordered medication including GI cocktail for epigastric pain; IV fluids for hydration; magnesium sulfate for hypomagnesemia; heparin for PE Reevaluation of the patient after these medicines showed that the patient improved I have reviewed the patients home medicines and have made adjustments as needed   Social Determinants of Health:  Currently homeless         Final Clinical Impression(s) / ED Diagnoses Final diagnoses:  Acute pulmonary embolism, unspecified pulmonary embolism type, unspecified whether acute cor pulmonale present [facility])    Rx / DC Orders ED Discharge Orders     None         Gloris Manchester, MD 12/13/22 2334

## 2022-12-13 NOTE — Hospital Course (Signed)
Cory Palmer is a 62 y.o. male with medical history significant for PAF not on anticoagulation, hepatic cirrhosis, T2DM, PUD, chronic thrombocytopenia, bipolar disorder, depression/anxiety, s/p bilateral BKA, tobacco use, and homelessness who is admitted with pulmonary embolism.

## 2022-12-13 NOTE — Progress Notes (Signed)
ANTICOAGULATION CONSULT NOTE - Initial Consult  Pharmacy Consult for Heparin Indication: pulmonary embolus  Allergies  Allergen Reactions   Atorvastatin Calcium Nausea And Vomiting   Gemfibrozil     Unknown   Insulin Glargine     Unknown    Patient Measurements: Height: 6' (182.9 cm) Weight: 99.8 kg (220 lb) IBW/kg (Calculated) : 77.6 Heparin Dosing Weight: 97.8 kg  Vital Signs: Temp: 99.5 F (37.5 C) (05/14 2031) Temp Source: Oral (05/14 2031) BP: 136/89 (05/14 1900) Pulse Rate: 84 (05/14 1900)  Labs: Recent Labs    12/13/22 1724 12/13/22 1953  HGB 12.6*  --   HCT 37.5*  --   PLT 99*  --   CREATININE 0.76  --   TROPONINIHS 3 4    Estimated Creatinine Clearance: 117.1 mL/min (by C-G formula based on SCr of 0.76 mg/dL).   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 Patient is presenting with chest pain and vomiting x 2 days. Heparin per pharmacy consult placed for pulmonary embolus. Small amount of pulmonary embolism involving a posterior lower lobe branch of the right pulmonary artery with very mild right heart strain noted on CTA.  Patient is not on anticoagulation prior to arrival.  Hgb 12.6; plt 99K. CrCl 117.1 ml/min.  Goal of Therapy:  Heparin level 0.3-0.7 units/ml Monitor platelets by anticoagulation protocol: Yes   Plan:  Give IV heparin 5000 units bolus x 1 Start heparin infusion at 1600 units/hr Check anti-Xa level in 6-8 hours and  daily while on heparin Continue to monitor H&H and platelets  Thank you for involving pharmacy in this patient's care.  Enos Fling, PharmD PGY2 Pharmacy Resident 12/13/2022 10:02 PM

## 2022-12-13 NOTE — ED Notes (Addendum)
ED TO INPATIENT HANDOFF REPORT  ED Nurse Name and Phone #: Morrie Sheldon RN [phone]  S Name/Age/Gender Cory Palmer 62 y.o. male Room/Bed: 008C/008C  Code Status   Code Status: Prior  Home/SNF/Other Home Patient oriented to: self, place, time, and situation Is this baseline? Yes   Triage Complete: Triage complete  Chief Complaint Acute pulmonary embolism (HCC) [I26.99]  Triage Note [facility] with [facility] and vomiting x 2 days with cough. Chest pain increase with palpation and cough, Sob. Sharp central and epigastric pain with LUQ abd. Pain.   Medication noncompliance.   324 ASA   125/65 98 hr irregular afib with hx afib 100 Ra 151 CBG    Allergies Allergies  Allergen Reactions   Atorvastatin Calcium Nausea And Vomiting   Gemfibrozil     Unknown   Insulin Glargine     Unknown    Level of Care/Admitting Diagnosis ED Disposition     ED Disposition  Admit   Condition  --   Comment  Hospital Area: [facility] [[id number]]  Level of Care: Telemetry Cardiac [[id number]]  May place patient in observation at [facility] or [facility] if equivalent level of care is available:: No  Covid Evaluation: Asymptomatic - no recent exposure (last 10 days) testing not required  Diagnosis: Acute pulmonary embolism [facility]) [[id number]]  Admitting Physician: Charlsie Quest [[id number]]  Attending Physician: Charlsie Quest [[id number]]          B Medical/Surgery History 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   Past Surgical History:  Procedure Laterality Date   AMPUTATION Left 09/28/2018   Procedure: LEFT TRANSMETATARSAL  AMPUTATION;  Surgeon: Nadara Mustard, MD;  Location: [facility] OR;  Service: Orthopedics;  Laterality: Left;   AMPUTATION Left 10/23/2019   Procedure: AMPUTATION BELOW KNEE;  Surgeon: Nadara Mustard, MD;  Location: [facility] OR;  Service: Orthopedics;  Laterality: Left;   BELOW KNEE LEG AMPUTATION Right    ESOPHAGOGASTRODUODENOSCOPY (EGD) WITH PROPOFOL N/A 10/19/2020   Procedure: ESOPHAGOGASTRODUODENOSCOPY (EGD) WITH PROPOFOL;  Surgeon: Charna Elizabeth, MD;  Location: [facility] ENDOSCOPY;  Service: Endoscopy;  Laterality: N/A;   TEE WITHOUT CARDIOVERSION N/A 10/25/2019   Procedure: TRANSESOPHAGEAL ECHOCARDIOGRAM (TEE);  Surgeon: Lewayne Bunting, MD;  Location: [facility] ENDOSCOPY;  Service: Cardiovascular;  Laterality: N/A;     A IV Location/Drains/Wounds Patient Lines/Drains/Airways Status     Active Line/Drains/Airways     Name Placement date Placement time Site Days   Peripheral IV 12/13/22 20 G Anterior;Left Forearm 12/13/22  1724  Forearm  less than 1            Intake/Output Last 24 hours  Intake/Output Summary (Last 24 hours) at 12/13/2022 2310 Last data filed at 12/13/2022 2054 Gross per 24 hour  Intake 610.24 ml  Output --  Net 610.24 ml    Labs/Imaging Results for orders placed or performed during the hospital encounter of 12/13/22 (from the past 48 hour(s))  Resp panel by RT-PCR (RSV, Flu A&B, Covid) Anterior Nasal Swab     Status: None   Collection Time: 12/13/22  4:15 PM   Specimen: Anterior  Nasal Swab  Result Value Ref Range   SARS Coronavirus 2 by RT PCR NEGATIVE NEGATIVE   Influenza A by PCR NEGATIVE NEGATIVE   Influenza B by PCR NEGATIVE NEGATIVE    Comment: (NOTE) The Xpert Xpress SARS-CoV-2/FLU/RSV plus assay is intended as an aid in the diagnosis of influenza from Nasopharyngeal swab specimens and should not be used as a sole basis for treatment. Nasal washings and aspirates are unacceptable for Xpert Xpress SARS-CoV-2/FLU/RSV testing.  Fact Sheet for  Patients: BloggerCourse.com  Fact Sheet for Healthcare Providers: SeriousBroker.it  This test is not yet approved or cleared by the [place] FDA and has been authorized for detection and/or diagnosis of SARS-CoV-2 by FDA under an Emergency Use Authorization (EUA). This EUA will remain in effect (meaning this test can be used) for the duration of the COVID-19 declaration under Section 564(b)(1) of the Act, 21 U.S.C. section 360bbb-3(b)(1), unless the authorization is terminated or revoked.     Resp Syncytial Virus by PCR NEGATIVE NEGATIVE    Comment: (NOTE) Fact Sheet for Patients: BloggerCourse.com  Fact Sheet for Healthcare Providers: SeriousBroker.it  This test is not yet approved or cleared by the [place] FDA and has been authorized for detection and/or diagnosis of SARS-CoV-2 by FDA under an Emergency Use Authorization (EUA). This EUA will remain in effect (meaning this test can be used) for the duration of the COVID-19 declaration under Section 564(b)(1) of the Act, 21 U.S.C. section 360bbb-3(b)(1), unless the authorization is terminated or revoked.  Performed at [facility], [address] [place], [place] [address]   CBG monitoring, ED     Status: Abnormal   Collection Time: 12/13/22  4:45 PM  Result Value Ref Range   Glucose-Capillary 135 (H) 70 - 99 mg/dL    Comment: Glucose reference range applies only to samples taken after fasting for at least 8 hours.  Basic metabolic panel     Status: Abnormal   Collection Time: 12/13/22  5:24 PM  Result Value Ref Range   Sodium 135 135 - 145 mmol/L   Potassium 3.5 3.5 - 5.1 mmol/L   Chloride 102 98 - 111 mmol/L   CO2 23 22 - 32 mmol/L   Glucose, Bld 140 (H) 70 - 99 mg/dL    Comment: Glucose reference range applies only to samples taken after fasting for at least 8 hours.   BUN 17 8 - 23 mg/dL    Creatinine, Ser 6.04 0.61 - 1.24 mg/dL   Calcium 9.0 8.9 - 54.0 mg/dL   GFR, Estimated >98 >11 mL/min    Comment: (NOTE) Calculated using the CKD-EPI Creatinine Equation (2021)    Anion gap 10 5 - 15    Comment: Performed at [facility], [address], [place], [place] [address]  Troponin I (High Sensitivity)     Status: None   Collection Time: 12/13/22  5:24 PM  Result Value Ref Range   Troponin I (High Sensitivity) 3 <18 ng/L    Comment: (NOTE) Elevated high sensitivity troponin I (hsTnI) values and significant  changes across serial measurements may suggest ACS but many other  chronic and acute conditions are known to elevate hsTnI results.  Refer to the "Links" section for chest pain algorithms and additional  guidance. Performed at [facility], [address], [place], [place] [address]   Magnesium     Status: Abnormal   Collection Time: 12/13/22  5:24 PM  Result Value Ref Range   Magnesium  1.6 (L) 1.7 - 2.4 mg/dL    Comment: Performed at [facility], [address], [place], [place] [address]  Lipase, blood     Status: None   Collection Time: 12/13/22  5:24 PM  Result Value Ref Range   Lipase 39 11 - 51 U/L    Comment: Performed at [facility], [address], [place], [place] [address]  Hepatic function panel     Status: Abnormal   Collection Time: 12/13/22  5:24 PM  Result Value Ref Range   Total Protein 6.5 6.5 - 8.1 g/dL   Albumin 3.4 (L) 3.5 - 5.0 g/dL   AST 20 15 - 41 U/L   ALT 13 0 - 44 U/L   Alkaline Phosphatase 94 38 - 126 U/L   Total Bilirubin 1.9 (H) 0.3 - 1.2 mg/dL   Bilirubin, Direct 0.6 (H) 0.0 - 0.2 mg/dL   Indirect Bilirubin 1.3 (H) 0.3 - 0.9 mg/dL    Comment: Performed at [facility], [address] [place], [place] [address]  CBC with Differential/Platelet     Status: Abnormal   Collection Time: 12/13/22  5:24 PM  Result Value Ref Range   WBC 9.4 4.0 - 10.5 K/uL   RBC 3.94 (L) 4.22 - 5.81 MIL/uL   Hemoglobin  12.6 (L) 13.0 - 17.0 g/dL   HCT 91.4 (L) 78.2 - 95.6 %   MCV 95.2 80.0 - 100.0 fL   MCH 32.0 26.0 - 34.0 pg   MCHC 33.6 30.0 - 36.0 g/dL   RDW 21.3 08.6 - 57.8 %   Platelets 99 (L) 150 - 400 K/uL    Comment: REPEATED TO VERIFY   nRBC 0.0 0.0 - 0.2 %   Neutrophils Relative % 75 %   Neutro Abs 7.0 1.7 - 7.7 K/uL   Lymphocytes Relative 15 %   Lymphs Abs 1.4 0.7 - 4.0 K/uL   Monocytes Relative 8 %   Monocytes Absolute 0.8 0.1 - 1.0 K/uL   Eosinophils Relative 2 %   Eosinophils Absolute 0.2 0.0 - 0.5 K/uL   Basophils Relative 0 %   Basophils Absolute 0.0 0.0 - 0.1 K/uL   Immature Granulocytes 0 %   Abs Immature Granulocytes 0.04 0.00 - 0.07 K/uL    Comment: Performed at [facility], [address], [place], [place] [address]  Troponin I (High Sensitivity)     Status: None   Collection Time: 12/13/22  7:53 PM  Result Value Ref Range   Troponin I (High Sensitivity) 4 <18 ng/L    Comment: (NOTE) Elevated high sensitivity troponin I (hsTnI) values and significant  changes across serial measurements may suggest ACS but many other  chronic and acute conditions are known to elevate hsTnI results.  Refer to the "Links" section for chest pain algorithms and additional  guidance. Performed at [facility], [address] [place], [place] [address]    CT Angio Chest PE W and/or Wo Contrast  Result Date: 12/13/2022 CLINICAL DATA:  Suspected pulmonary embolism. EXAM: CT ANGIOGRAPHY CHEST WITH CONTRAST TECHNIQUE: Multidetector CT imaging of the chest was performed using the standard protocol during bolus administration of intravenous contrast. Multiplanar CT image reconstructions and MIPs were obtained to evaluate the vascular anatomy. RADIATION DOSE REDUCTION: This exam was performed according to the departmental dose-optimization program which includes automated exposure control, adjustment of the mA and/or kV according to patient size and/or use of iterative reconstruction  technique. CONTRAST:  75mL  OMNIPAQUE IOHEXOL 350 MG/ML SOLN COMPARISON:  August 27, 2022 FINDINGS: Cardiovascular: The thoracic aorta is normal in appearance. Satisfactory opacification of the pulmonary arteries to the segmental level. A small amount of intraluminal low attenuation is seen involving a posterior lower lobe branch of the right pulmonary artery (axial CT images 95 through 97, CT series 5). There is mild cardiomegaly with moderate to marked severity coronary artery calcification. Very mild right heart straight is seen (RV/LV ratio of 1.03) no pericardial effusion. Mediastinum/Nodes: No enlarged mediastinal, hilar, or axillary lymph nodes. Thyroid gland, trachea, and esophagus demonstrate no significant findings. Lungs/Pleura: Mild to moderate severity right upper lobe scarring, atelectasis and/or infiltrate is seen. This is markedly decreased in severity when compared to the prior study. Mild lingular and mild to moderate severity posterior bilateral lower lobe atelectasis is seen, right greater than left. No pleural effusion or pneumothorax is identified. Upper Abdomen: The liver is cirrhotic in appearance. Musculoskeletal: No chest wall abnormality. No acute or significant osseous findings. Review of the MIP images confirms the above findings. IMPRESSION: 1. Small amount of pulmonary embolism involving a posterior lower lobe branch of the right pulmonary artery with very mild right heart strain. 2. Mild to moderate severity right upper lobe scarring, atelectasis and/or infiltrate, markedly decreased in severity when compared to the prior study. 3. Mild lingular and mild to moderate severity posterior bilateral lower lobe atelectasis, right greater than left. 4. Cardiomegaly with moderate to marked severity coronary artery calcification. 5. Cirrhosis. Electronically Signed   By: Aram Candela M.D.   On: 12/13/2022 21:40   DG Chest Portable 1 View  Result Date: 12/13/2022 CLINICAL DATA:   Shortness of breath EXAM: PORTABLE CHEST 1 VIEW COMPARISON:  12/05/2022 and prior studies FINDINGS: The cardiomediastinal silhouette is unremarkable. Mild streaky opacities in the LEFT LOWER lung noted which may represent atelectasis or less likely infection. There is no evidence of mass, pleural effusion or pneumothorax. No acute bony abnormalities are present. IMPRESSION: Mild streaky LEFT LOWER lung opacities which may represent atelectasis or less likely infection. Electronically Signed   By: Harmon Pier M.D.   On: 12/13/2022 16:31    Pending Labs Unresulted Labs (From admission, onward)     Start     Ordered   12/14/22 0500  Heparin level (unfractionated)  Daily,   R      12/13/22 2206   12/13/22 1614  Urinalysis, Routine w reflex microscopic -Urine, Clean Catch  Once,   URGENT       Question:  Specimen Source  Answer:  Urine, Clean Catch   12/13/22 1614            Vitals/Pain Today's Vitals   12/13/22 1900 12/13/22 2031 12/13/22 2200 12/13/22 2300  BP: 136/89  110/71 106/64  Pulse: 84  80 79  Resp: (!) 21  17 20   Temp:  99.5 F (37.5 C)    TempSrc:  Oral    SpO2: 100%  94% 96%  Weight:      Height:      PainSc:        Isolation Precautions No active isolations  Medications Medications  heparin ADULT infusion 100 units/mL (25000 units/236mL) (1,600 Units/hr Intravenous New Bag/Given 12/13/22 2242)  alum & mag hydroxide-simeth (MAALOX/MYLANTA) 200-200-20 MG/5ML suspension 30 mL (30 mLs Oral Given 12/13/22 1717)    And  lidocaine (XYLOCAINE) 2 % viscous mouth solution 15 mL (15 mLs Oral Given 12/13/22 1717)  famotidine (PEPCID) IVPB 20 mg premix (0 mg Intravenous  Stopped 12/13/22 1917)  lactated ringers bolus 500 mL (0 mLs Intravenous Stopped 12/13/22 1942)  magnesium sulfate IVPB 2 g 50 mL (0 g Intravenous Stopped 12/13/22 2054)  iohexol (OMNIPAQUE) 350 MG/ML injection 75 mL (75 mLs Intravenous Contrast Given 12/13/22 2125)  heparin bolus via infusion 5,000 Units (5,000  Units Intravenous Bolus from Bag 12/13/22 2242)    Mobility Wheelchair, amputee     Focused Assessments Pulmonary Assessment Handoff:  Lung sounds:   O2 Device: Room Air      R Recommendations: See Admitting Provider Note  Report given to:   Additional Notes:

## 2022-12-13 NOTE — ED Triage Notes (Signed)
Pt BIBGEMS from [employer] with cp and vomiting x 2 days with cough. Chest pain increase with palpation and cough, Sob. Sharp central and epigastric pain with LUQ abd. Pain.   Medication noncompliance.   324 ASA   125/65 98 hr irregular afib with hx afib 100 Ra 151 CBG

## 2022-12-14 ENCOUNTER — Other Ambulatory Visit (HOSPITAL_COMMUNITY): Payer: Self-pay

## 2022-12-14 ENCOUNTER — Observation Stay (HOSPITAL_BASED_OUTPATIENT_CLINIC_OR_DEPARTMENT_OTHER): Payer: 59

## 2022-12-14 ENCOUNTER — Telehealth (HOSPITAL_COMMUNITY): Payer: Self-pay | Admitting: Pharmacy Technician

## 2022-12-14 DIAGNOSIS — I480 Paroxysmal atrial fibrillation: Secondary | ICD-10-CM

## 2022-12-14 DIAGNOSIS — D696 Thrombocytopenia, unspecified: Secondary | ICD-10-CM

## 2022-12-14 DIAGNOSIS — K7460 Unspecified cirrhosis of liver: Secondary | ICD-10-CM | POA: Diagnosis not present

## 2022-12-14 DIAGNOSIS — F319 Bipolar disorder, unspecified: Secondary | ICD-10-CM

## 2022-12-14 DIAGNOSIS — I2699 Other pulmonary embolism without acute cor pulmonale: Secondary | ICD-10-CM | POA: Diagnosis not present

## 2022-12-14 DIAGNOSIS — I10 Essential (primary) hypertension: Secondary | ICD-10-CM

## 2022-12-14 LAB — CBC
HCT: 32.9 % — ABNORMAL LOW (ref 39.0–52.0)
Hemoglobin: 11.5 g/dL — ABNORMAL LOW (ref 13.0–17.0)
MCH: 32.5 pg (ref 26.0–34.0)
MCHC: 35 g/dL (ref 30.0–36.0)
MCV: 92.9 fL (ref 80.0–100.0)
Platelets: 87 10*3/uL — ABNORMAL LOW (ref 150–400)
RBC: 3.54 MIL/uL — ABNORMAL LOW (ref 4.22–5.81)
RDW: 13 % (ref 11.5–15.5)
WBC: 6.6 10*3/uL (ref 4.0–10.5)
nRBC: 0 % (ref 0.0–0.2)

## 2022-12-14 LAB — COMPREHENSIVE METABOLIC PANEL
ALT: 15 U/L (ref 0–44)
AST: 22 U/L (ref 15–41)
Albumin: 2.9 g/dL — ABNORMAL LOW (ref 3.5–5.0)
Alkaline Phosphatase: 87 U/L (ref 38–126)
Anion gap: 7 (ref 5–15)
BUN: 13 mg/dL (ref 8–23)
CO2: 20 mmol/L — ABNORMAL LOW (ref 22–32)
Calcium: 8.4 mg/dL — ABNORMAL LOW (ref 8.9–10.3)
Chloride: 106 mmol/L (ref 98–111)
Creatinine, Ser: 0.74 mg/dL (ref 0.61–1.24)
GFR, Estimated: >60 mL/min (ref >60)
Glucose, Bld: 130 mg/dL — ABNORMAL HIGH (ref 70–99)
Potassium: 3.2 mmol/L — ABNORMAL LOW (ref 3.5–5.1)
Sodium: 133 mmol/L — ABNORMAL LOW (ref 135–145)
Total Bilirubin: 1.8 mg/dL — ABNORMAL HIGH (ref 0.3–1.2)
Total Protein: 5.7 g/dL — ABNORMAL LOW (ref 6.5–8.1)

## 2022-12-14 LAB — ECHOCARDIOGRAM COMPLETE
AR max vel: 2.51 cm2
AV Area VTI: 2.54 cm2
AV Area mean vel: 2.41 cm2
AV Mean grad: 4 mmHg
AV Peak grad: 8.3 mmHg
Ao pk vel: 1.44 m/s
Height: 72 in
S' Lateral: 3.5 cm
Weight: 3724.89 oz

## 2022-12-14 LAB — GLUCOSE, CAPILLARY
Glucose-Capillary: 132 mg/dL — ABNORMAL HIGH (ref 70–99)
Glucose-Capillary: 137 mg/dL — ABNORMAL HIGH (ref 70–99)

## 2022-12-14 LAB — HIV ANTIBODY (ROUTINE TESTING W REFLEX): HIV Screen 4th Generation wRfx: NONREACTIVE

## 2022-12-14 LAB — MRSA NEXT GEN BY PCR, NASAL: MRSA by PCR Next Gen: NOT DETECTED

## 2022-12-14 LAB — MAGNESIUM: Magnesium: 1.8 mg/dL (ref 1.7–2.4)

## 2022-12-14 LAB — HEPARIN LEVEL (UNFRACTIONATED): Heparin Unfractionated: 0.35 IU/mL (ref 0.30–0.70)

## 2022-12-14 MED ORDER — APIXABAN 5 MG PO TABS: 5.0000 mg | ORAL_TABLET | Freq: Two times a day (BID) | ORAL | 1 refills | Status: DC

## 2022-12-14 MED ORDER — APIXABAN 5 MG PO TABS: 5.0000 mg | ORAL_TABLET | Freq: Two times a day (BID) | ORAL | Status: DC

## 2022-12-14 MED ORDER — APIXABAN 5 MG PO TABS
5.0000 mg | ORAL_TABLET | Freq: Two times a day (BID) | ORAL | Status: DC
  Administered 2022-12-14: 5 mg via ORAL
  Filled 2022-12-14: qty 1

## 2022-12-14 MED ORDER — POTASSIUM CHLORIDE CRYS ER 20 MEQ PO TBCR
80.0000 meq | EXTENDED_RELEASE_TABLET | Freq: Once | ORAL | Status: AC
  Administered 2022-12-14: 80 meq via ORAL
  Filled 2022-12-14: qty 4

## 2022-12-14 MED ORDER — APIXABAN 5 MG PO TABS: 10.0000 mg | ORAL_TABLET | Freq: Two times a day (BID) | ORAL | Status: DC

## 2022-12-14 MED ORDER — APIXABAN 5 MG PO TABS
5.0000 mg | ORAL_TABLET | Freq: Once | ORAL | Status: AC
  Administered 2022-12-14: 5 mg via ORAL
  Filled 2022-12-14: qty 1

## 2022-12-14 MED ORDER — APIXABAN (ELIQUIS) VTE STARTER PACK (10MG AND 5MG)
ORAL_TABLET | ORAL | 0 refills | Status: DC
  Filled 2022-12-14: qty 74, 30d supply, fill #0

## 2022-12-14 MED ORDER — PERFLUTREN LIPID MICROSPHERE
1.0000 mL | INTRAVENOUS | Status: AC | PRN
  Administered 2022-12-14: 3 mL via INTRAVENOUS

## 2022-12-14 NOTE — Telephone Encounter (Signed)
Pharmacy Patient Advocate Encounter  Insurance verification completed.    The patient is insured through [employer] Medicare Part D   The patient is currently admitted and ran test claims for the following: Eliquis, Xarelto.  Copays and coinsurance results were relayed to Inpatient clinical team.      

## 2022-12-14 NOTE — Evaluation (Signed)
Physical Therapy Evaluation and DISCHARGE Patient Details Name: Cory Palmer MRN: [id number] DOB: 10-04-1960 Today's Date: 12/14/2022  History of Present Illness  Cory Palmer is a 62 y.o. male who presented to the ED for evaluation of chest pain and cough. Pt found to have PE. PMH: PAF not on anticoagulation, hepatic cirrhosis, T2DM, PUD, chronic thrombocytopenia, bipolar disorder, depression/anxiety, s/p bilateral BKA, tobacco use, and homelessness   Clinical Impression  Pt admitted with above. Pt with c/o chest pain, RN and MD aware. PT educated pt on ant/posterior transfer in/out of chair as opposed to pt going down to floor on his knees and climbing into his w/c. Pt indep with w/c propulsion as well. Pt functioning at baseline. Unfortunately pt's current w/c does not fit him and doesn't provide adequate support as the seat is too short and half of his femurs extend past the end. Pt with report of frequent falls out of the w/c as pt is unbalanced. Pt to benefit from a proper w/c fitting and new w/c to provide optimal support and safer mobility as pt is w/c bound due to bilat BKAs. Spoke with case management who will follow up to see if able to acquire proper fitting w/c. Pt with no further acute PT needs at this time. PT SIGNING OFF.       Recommendations for follow up therapy are one component of a multi-disciplinary discharge planning process, led by the attending physician.  Recommendations may be updated based on patient status, additional functional criteria and insurance authorization.  Follow Up Recommendations       Assistance Recommended at Discharge None  Patient can return home with the following       Equipment Recommendations  (pt to benefit from proper fitting w/c as pt's current w/c is too small and pt is at increased falls risk)  Recommendations for Other Services       Functional Status Assessment       Precautions / Restrictions Precautions Precautions:  Fall Precaution Comments: bilat BKA with a w/c that doesn't fit properly      Mobility  Bed Mobility Overal bed mobility: Independent             General bed mobility comments: no difficulty    Transfers Overall transfer level: Modified independent Equipment used: None               General transfer comment: pt reports going down on his knees and then climbing in chair, PT educated him on anterior/posterior transfer in which pt was able to do without physical assist as well    Ambulation/Gait               General Gait Details: pt non-ambulatory at baseline  Administrator mobility: Yes Wheelchair propulsion: Both upper extremities Wheelchair parts: Independent Distance: 200 Wheelchair Assistance Details (indicate cue type and reason): no physical assist needed  Modified Rankin (Stroke Patients Only)       Balance Overall balance assessment: Modified Independent                                           Pertinent Vitals/Pain Pain Assessment Pain Assessment: 0-10 Pain Score: 6  Pain Location: chest pain Pain Descriptors / Indicators: Jabbing Pain Intervention(s):  (RN aware)    Home  Living Family/patient expects to be discharged to:: Shelter/Homeless                   Additional Comments: pt states he's been homeless for 11 years and stays outside [employer]. he reports he will stay at a motel for 2 days at the beginning of the month to shower, shave, and wash clothes    Prior Function Prior Level of Function : Independent/Modified Independent             Mobility Comments: indep with transfers from ground to w/c, indep with w/c propulsion ADLs Comments: indep/ mod I but doesn't bath regularly or have household shores due to homelessness     Hand Dominance   Dominant Hand: Right    Extremity/Trunk Assessment   Upper Extremity Assessment Upper  Extremity Assessment: Overall WFL for tasks assessed    Lower Extremity Assessment Lower Extremity Assessment:  (bilat BKAs, hips and knees WFL AROM)    Cervical / Trunk Assessment Cervical / Trunk Assessment: Normal  Communication   Communication:  (muffled speech due to not having any teeth)  Cognition Arousal/Alertness: Awake/alert Behavior During Therapy: WFL for tasks assessed/performed Overall Cognitive Status: History of cognitive impairments - at baseline                                 General Comments: aware of psych history. pt with decreased insight on how to care for self properly however this is his baseline as pt has been homeless for 11  years and has survived the streets        General Comments General comments (skin integrity, edema, etc.): VSS on RA    Exercises     Assessment/Plan    PT Assessment Patient does not need any further PT services  PT Problem List         PT Treatment Interventions      PT Goals (Current goals can be found in the Care Plan section)  Acute Rehab PT Goals Patient Stated Goal: a home PT Goal Formulation: All assessment and education complete, DC therapy    Frequency       Co-evaluation               AM-PAC PT "6 Clicks" Mobility  Outcome Measure Help needed turning from your back to your side while in a flat bed without using bedrails?: None Help needed moving from lying on your back to sitting on the side of a flat bed without using bedrails?: None Help needed moving to and from a bed to a chair (including a wheelchair)?: None Help needed standing up from a chair using your arms (e.g., wheelchair or bedside chair)?: Total (pt with bilat BKAs) Help needed to walk in hospital room?: Total (pt with bilat BKAs) Help needed climbing 3-5 steps with a railing? : Total (pt with Bilat BKAs) 6 Click Score: 15    End of Session   Activity Tolerance: Patient tolerated treatment well Patient left: in  bed;with call bell/phone within reach;with bed alarm set Nurse Communication: Mobility status (continued report of chest pain) PT Visit Diagnosis: History of falling (Z91.81)    Time: 1610-9604 PT Time Calculation (min) (ACUTE ONLY): 25 min   Charges:   PT Evaluation $PT Eval Moderate Complexity: 1 Mod PT Treatments $Wheel Chair Management: 8-22 mins        Lewis Shock, PT, DPT Acute Rehabilitation Services Secure chat preferred  Office #: [phone]   Iona Hansen 12/14/2022, 12:42 PM

## 2022-12-14 NOTE — Progress Notes (Addendum)
ANTICOAGULATION CONSULT NOTE - Initial Consult  Pharmacy Consult for Heparin >> apixaban  Indication: pulmonary embolus  Allergies  Allergen Reactions   Atorvastatin Calcium Nausea And Vomiting   Gemfibrozil     Unknown   Insulin Glargine     Unknown    Patient Measurements: Height: 6' (182.9 cm) (bilat amputation) Weight: 105.6 kg (232 lb 12.9 oz) IBW/kg (Calculated) : 77.6 Heparin Dosing Weight: 100 kg  Vital Signs: Temp: 98.6 F (37 C) (05/15 0703) Temp Source: Oral (05/15 0703) BP: 109/68 (05/15 0703) Pulse Rate: 75 (05/15 0703)  Labs: Recent Labs    12/13/22 1724 12/13/22 1953 12/14/22 0720  HGB 12.6*  --  11.5*  HCT 37.5*  --  32.9*  PLT 99*  --  87*  HEPARINUNFRC  --   --  0.35  CREATININE 0.76  --   --   TROPONINIHS 3 4  --     Estimated Creatinine Clearance: 120.3 mL/min (by C-G formula based on SCr of 0.76 mg/dL).   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 Patient is presenting with chest pain and vomiting x 2 days and found to have a small amount of pulmonary embolism involving a posterior lower lobe branch of the right pulmonary artery with very mild right heart strain noted on CTA. No anticoagulation prior to admission. Pharmacy consulted for heparin.    Heparin level 0.35 is therapeutic on 1600 units/hr.  No issues with infusion or bleeding per RN. Will increase slightly to keep in goal range.     Goal of Therapy:  Heparin level 0.3-0.7 units/ml Monitor platelets by anticoagulation protocol: Yes   Plan:    Increase heparin infusion to 1650 units/hr Check anti-Xa level in 8 hours  Monitor daily heparin level, CBC Monitor for signs/symptoms of bleeding   ADDENDUM 9:30: consulted to switch to apixaban. Will start 5mg  BID and stop heparin.    Thank you for involving pharmacy in this patient's care.  Alphia Moh, PharmD, BCPS, BCCP Clinical Pharmacist  Please check AMION for all [facility] phone numbers After 10:00 PM, call Main Pharmacy [phone]

## 2022-12-14 NOTE — Progress Notes (Deleted)
   12/14/22 1159  SDOH Interventions  Housing Interventions Inpatient TOC;Patient Declined   CSW met with pt at bedside. Pt confirmed [facility] Pt states he will return there and accepted bus pass. Pt states he has a IT trainer in the community who is working on securing housing. Declines resources at this time. TOC will sign off, please re consult for any additional needs.

## 2022-12-14 NOTE — Discharge Summary (Signed)
Physician Discharge Summary  JARRARD HINDLEY WUJ:[id number] DOB: [birth year] DOA: 12/13/2022  PCP: System, Provider Not In  Admit date: 12/13/2022 Discharge date: 12/14/2022  Admitted From: Home Disposition: Home  Recommendations for Outpatient Follow-up:  Follow up with PCP in 1 week with repeat CBC/BMP Comply with medications and follow-up Follow up in ED if symptoms worsen or new appear   Home Health: No Equipment/Devices: None  Discharge Condition: Stable CODE STATUS: Full Diet recommendation: Heart healthy  Brief/Interim Summary: 62 y.o. male with medical history significant for PAF not on anticoagulation, hepatic cirrhosis, T2DM, PUD, chronic thrombocytopenia, bipolar disorder, depression/anxiety, s/p bilateral BKA, tobacco use, and homelessness presented with chest pain and cough.  On presentation, COVID-19/influenza/RSV PCR were negative.  Troponins x 2 were negative.  CTA chest showed small PE involving posterior lower lobe branch of the right pulmonary artery with very mild right heart strain per CT imaging.  He was started on heparin drip.  Subsequently, he has been transitioned to oral Eliquis.  He remains on room air.  Echo showed EF of 60-65 %.  He will be discharged home today on oral Eliquis.  Discharge Diagnoses:   Acute pulmonary embolism -CTA chest showed small PE involving posterior lower lobe branch of the right pulmonary artery with very mild right heart strain per CT imaging.  He was started on heparin drip.  Subsequently, he has been transitioned to oral Eliquis.  He remains on room air.  Echo showed EF of 60-65 %.  He will be discharged home today on oral Eliquis.  Paroxysmal A-fib -Eliquis plan as above.  Currently rate controlled.  Hepatic cirrhosis -Currently stable.  Outpatient follow-up with PCP and GI  Hypokalemia -Replace prior to discharge  Hypomagnesemia - improved   chronic thrombocytopenia -Platelets low but stable.  Outpatient  follow-up  Diabetes mellitus type 2 -Resume metformin.  Outpatient follow-up with PCP.  Carb modified diet  GERD with history of gastritis and duodenal ulcers -Continue Protonix  Bipolar disorder/depression/anxiety -Continue home regimen.  Outpatient follow-up with PCP/psychiatry  Status post bilateral BKA -Wheelchair dependent  Tobacco use -Counseled regarding cessation by admitting hospitalist  Homelessness -TOC consulted  Obesity -Outpatient follow-up     Discharge Instructions  Discharge Instructions     Diet - low sodium heart healthy   Complete by: As directed    Increase activity slowly   Complete by: As directed       Allergies as of 12/14/2022       Reactions   Atorvastatin Calcium Nausea And Vomiting   Gemfibrozil    Unknown   Insulin Glargine    Unknown        Medication List     STOP taking these medications    gabapentin 300 MG capsule Commonly known as: Neurontin       TAKE these medications    Apixaban Starter Pack (10mg  and 5mg ) Commonly known as: ELIQUIS STARTER PACK Take as directed on package: start with two-5mg  tablets twice daily for 7 days. On day 8, switch to one-5mg  tablet twice daily.   metFORMIN 500 MG tablet Commonly known as: GLUCOPHAGE Take 1 tablet (500 mg total) by mouth 2 (two) times daily with a meal.   pantoprazole 40 MG tablet Commonly known as: PROTONIX Take 40 mg by mouth daily.   QUEtiapine 300 MG tablet Commonly known as: SEROQUEL Take 300 mg by mouth 2 (two) times daily.   traZODone 150 MG tablet Commonly known as: DESYREL Take 150 mg by mouth at bedtime.  Follow-up Information     PCP. Schedule an appointment as soon as possible for a visit in 1 week(s).                 Allergies  Allergen Reactions   Atorvastatin Calcium Nausea And Vomiting   Gemfibrozil     Unknown   Insulin Glargine     Unknown    Consultations: None   Procedures/Studies: ECHOCARDIOGRAM  COMPLETE  Result Date: 12/14/2022    ECHOCARDIOGRAM REPORT   Patient Name:   WILLIIAM DUNSTER Date of Exam: 12/14/2022 Medical Rec #:  [id number]     Height:       72.0 in Accession #:    [id number]    Weight:       232.8 lb Date of Birth:  07/16/1961    BSA:          2.273 m Patient Age:    61 years      BP:           109/68 mmHg Patient Gender: M             HR:           75 bpm. Exam Location:  Inpatient Procedure: 2D Echo, Color Doppler, Cardiac Doppler and Intracardiac            Opacification Agent Indications:    Pulmonary Embolus  History:        Patient has prior history of Echocardiogram examinations, most                 recent 10/25/2019. Arrythmias:Atrial Fibrillation; Risk                 Factors:Current Smoker, Diabetes, Hypertension and Cocaine Use.                 Pulmonary Embolus,.  Sonographer:    Milbert Coulter Referring Phys: [id number] Floreen Comber PATEL  Sonographer Comments: Patient is obese. Image acquisition challenging due to patient body habitus, Image acquisition challenging due to uncooperative patient and Image acquisition challenging due to respiratory motion. IMPRESSIONS  1. Left ventricular ejection fraction, by estimation, is 60 to 65%. The left ventricle has normal function. The left ventricle has no regional wall motion abnormalities. There is moderate concentric left ventricular hypertrophy. Left ventricular diastolic parameters are indeterminate.  2. Right ventricular systolic function is normal. The right ventricular size is normal.  3. Left atrial size was mildly dilated.  4. The mitral valve was not well visualized. No evidence of mitral valve regurgitation.  5. The aortic valve is tricuspid. There is moderate calcification of the aortic valve. There is mild thickening of the aortic valve. Aortic valve regurgitation is not visualized. Aortic valve sclerosis/calcification is present, without any evidence of aortic stenosis. Comparison(s): No significant change from prior study. FINDINGS   Left Ventricle: Left ventricular ejection fraction, by estimation, is 60 to 65%. The left ventricle has normal function. The left ventricle has no regional wall motion abnormalities. Definity contrast agent was given IV to delineate the left ventricular  endocardial borders. The left ventricular internal cavity size was normal in size. There is moderate concentric left ventricular hypertrophy. Left ventricular diastolic parameters are indeterminate. Right Ventricle: The right ventricular size is normal. No increase in right ventricular wall thickness. Right ventricular systolic function is normal. Left Atrium: Left atrial size was mildly dilated. Right Atrium: Right atrial size was not assessed. Pericardium: There is no evidence of pericardial effusion. Mitral Valve: The mitral  valve was not well visualized. No evidence of mitral valve regurgitation. Tricuspid Valve: The tricuspid valve is not well visualized. Tricuspid valve regurgitation is not demonstrated. Aortic Valve: The aortic valve is tricuspid. There is moderate calcification of the aortic valve. There is mild thickening of the aortic valve. Aortic valve regurgitation is not visualized. Aortic valve sclerosis/calcification is present, without any evidence of aortic stenosis. Aortic valve mean gradient measures 4.0 mmHg. Aortic valve peak gradient measures 8.3 mmHg. Aortic valve area, by VTI measures 2.54 cm. Pulmonic Valve: The pulmonic valve was not well visualized. Pulmonic valve regurgitation is not visualized. Aorta: The aortic root is normal in size and structure and the ascending aorta was not well visualized. IAS/Shunts: The interatrial septum was not well visualized.  LEFT VENTRICLE PLAX 2D LVIDd:         5.00 cm LVIDs:         3.50 cm LV PW:         1.40 cm LV IVS:        1.40 cm LVOT diam:     2.00 cm LV SV:         61 LV SV Index:   27 LVOT Area:     3.14 cm  RIGHT VENTRICLE RV S prime:     11.70 cm/s TAPSE (M-mode): 2.3 cm LEFT ATRIUM              Index LA diam:        4.80 cm 2.11 cm/m LA Vol (A2C):   63.3 ml 27.85 ml/m LA Vol (A4C):   95.5 ml 42.02 ml/m LA Biplane Vol: 79.9 ml 35.16 ml/m  AORTIC VALVE AV Area (Vmax):    2.51 cm AV Area (Vmean):   2.41 cm AV Area (VTI):     2.54 cm AV Vmax:           144.00 cm/s AV Vmean:          95.500 cm/s AV VTI:            0.241 m AV Peak Grad:      8.3 mmHg AV Mean Grad:      4.0 mmHg LVOT Vmax:         115.00 cm/s LVOT Vmean:        73.400 cm/s LVOT VTI:          0.195 m LVOT/AV VTI ratio: 0.81  AORTA Ao Root diam: 3.80 cm  SHUNTS Systemic VTI:  0.20 m Systemic Diam: 2.00 cm Riley Lam MD Electronically signed by Riley Lam MD Signature Date/Time: 12/14/2022/11:52:48 AM    Final    CT Angio Chest PE W and/or Wo Contrast  Result Date: 12/13/2022 CLINICAL DATA:  Suspected pulmonary embolism. EXAM: CT ANGIOGRAPHY CHEST WITH CONTRAST TECHNIQUE: Multidetector CT imaging of the chest was performed using the standard protocol during bolus administration of intravenous contrast. Multiplanar CT image reconstructions and MIPs were obtained to evaluate the vascular anatomy. RADIATION DOSE REDUCTION: This exam was performed according to the departmental dose-optimization program which includes automated exposure control, adjustment of the mA and/or kV according to patient size and/or use of iterative reconstruction technique. CONTRAST:  75mL OMNIPAQUE IOHEXOL 350 MG/ML SOLN COMPARISON:  August 27, 2022 FINDINGS: Cardiovascular: The thoracic aorta is normal in appearance. Satisfactory opacification of the pulmonary arteries to the segmental level. A small amount of intraluminal low attenuation is seen involving a posterior lower lobe branch of the right pulmonary artery (axial CT images 95 through 97, CT series 5).  There is mild cardiomegaly with moderate to marked severity coronary artery calcification. Very mild right heart straight is seen (RV/LV ratio of 1.03) no pericardial effusion.  Mediastinum/Nodes: No enlarged mediastinal, hilar, or axillary lymph nodes. Thyroid gland, trachea, and esophagus demonstrate no significant findings. Lungs/Pleura: Mild to moderate severity right upper lobe scarring, atelectasis and/or infiltrate is seen. This is markedly decreased in severity when compared to the prior study. Mild lingular and mild to moderate severity posterior bilateral lower lobe atelectasis is seen, right greater than left. No pleural effusion or pneumothorax is identified. Upper Abdomen: The liver is cirrhotic in appearance. Musculoskeletal: No chest wall abnormality. No acute or significant osseous findings. Review of the MIP images confirms the above findings. IMPRESSION: 1. Small amount of pulmonary embolism involving a posterior lower lobe branch of the right pulmonary artery with very mild right heart strain. 2. Mild to moderate severity right upper lobe scarring, atelectasis and/or infiltrate, markedly decreased in severity when compared to the prior study. 3. Mild lingular and mild to moderate severity posterior bilateral lower lobe atelectasis, right greater than left. 4. Cardiomegaly with moderate to marked severity coronary artery calcification. 5. Cirrhosis. Electronically Signed   By: Aram Candela M.D.   On: 12/13/2022 21:40   DG Chest Portable 1 View  Result Date: 12/13/2022 CLINICAL DATA:  Shortness of breath EXAM: PORTABLE CHEST 1 VIEW COMPARISON:  12/05/2022 and prior studies FINDINGS: The cardiomediastinal silhouette is unremarkable. Mild streaky opacities in the LEFT LOWER lung noted which may represent atelectasis or less likely infection. There is no evidence of mass, pleural effusion or pneumothorax. No acute bony abnormalities are present. IMPRESSION: Mild streaky LEFT LOWER lung opacities which may represent atelectasis or less likely infection. Electronically Signed   By: Harmon Pier M.D.   On: 12/13/2022 16:31   CT Head Wo Contrast  Result Date:  12/05/2022 CLINICAL DATA:  Altered mental status. EXAM: CT HEAD WITHOUT CONTRAST TECHNIQUE: Contiguous axial images were obtained from the base of the skull through the vertex without intravenous contrast. RADIATION DOSE REDUCTION: This exam was performed according to the departmental dose-optimization program which includes automated exposure control, adjustment of the mA and/or kV according to patient size and/or use of iterative reconstruction technique. COMPARISON:  CT Head 11/11/21 FINDINGS: Brain: No evidence of acute infarction, hemorrhage, hydrocephalus, extra-axial collection or mass lesion/mass effect. Vascular: No hyperdense vessel or unexpected calcification. Skull: Normal. Negative for fracture or focal lesion. Sinuses/Orbits: No middle ear or mastoid effusion. Paranasal sinuses are notable for mucosal thickening right maxillary sinus. Orbits are unremarkable. Other: None. IMPRESSION: No acute intracranial abnormality. Electronically Signed   By: Lorenza Cambridge M.D.   On: 12/05/2022 13:01   DG Chest Portable 1 View  Result Date: 12/05/2022 CLINICAL DATA:  Altered mental status EXAM: PORTABLE CHEST 1 VIEW COMPARISON:  Portable exam 1215 hours compared to 09/11/2022 FINDINGS: Enlargement of cardiac silhouette. Mediastinal contours and pulmonary vascularity normal. Near-complete resolution of previously identified RIGHT upper lobe infiltrate. Minimal bibasilar atelectasis. No new consolidation, pleural effusion, or pneumothorax. Osseous structures unremarkable. IMPRESSION: Minimal bibasilar atelectasis. Improved RIGHT upper lobe pneumonia since previous study. Electronically Signed   By: [name] M.D.   On: 12/05/2022 12:31      Subjective: Patient seen and examined at bedside.  Denies any regurgitation.  No fever, vomiting, abdominal reported.  Discharge Exam: Vitals:   12/14/22 0703 12/14/22 1134  BP: 109/68 117/77  Pulse: 75 81  Resp: (!) 22 15  Temp: 98.6 F (37 C)  98.5 F (36.9 C)   SpO2: 91% 92%    General: Pt is alert, awake, not in acute distress.  Chronically ill and deconditioned.  On room air.  Slow to respond.  Poor historian. Cardiovascular: rate controlled, S1/S2 + Respiratory: bilateral decreased breath sounds at bases with scattered crackles and intermittent tachypnea Abdominal: Soft, obese, NT, ND, bowel sounds + Extremities: Bilateral BKA present    The results of significant diagnostics from this hospitalization (including imaging, microbiology, ancillary and laboratory) are listed below for reference.     Microbiology: Recent Results (from the past 240 hour(s))  Resp panel by RT-PCR (RSV, Flu A&B, Covid) Anterior Nasal Swab     Status: None   Collection Time: 12/13/22  4:15 PM   Specimen: Anterior Nasal Swab  Result Value Ref Range Status   SARS Coronavirus 2 by RT PCR NEGATIVE NEGATIVE Final   Influenza A by PCR NEGATIVE NEGATIVE Final   Influenza B by PCR NEGATIVE NEGATIVE Final    Comment: (NOTE) The Xpert Xpress SARS-CoV-2/FLU/RSV plus assay is intended as an aid in the diagnosis of influenza from Nasopharyngeal swab specimens and should not be used as a sole basis for treatment. Nasal washings and aspirates are unacceptable for Xpert Xpress SARS-CoV-2/FLU/RSV testing.  Fact Sheet for Patients: BloggerCourse.com  Fact Sheet for Healthcare Providers: SeriousBroker.it  This test is not yet approved or cleared by the [place] FDA and has been authorized for detection and/or diagnosis of SARS-CoV-2 by FDA under an Emergency Use Authorization (EUA). This EUA will remain in effect (meaning this test can be used) for the duration of the COVID-19 declaration under Section 564(b)(1) of the Act, 21 U.S.C. section 360bbb-3(b)(1), unless the authorization is terminated or revoked.     Resp Syncytial Virus by PCR NEGATIVE NEGATIVE Final    Comment: (NOTE) Fact Sheet for  Patients: BloggerCourse.com  Fact Sheet for Healthcare Providers: SeriousBroker.it  This test is not yet approved or cleared by the [place] FDA and has been authorized for detection and/or diagnosis of SARS-CoV-2 by FDA under an Emergency Use Authorization (EUA). This EUA will remain in effect (meaning this test can be used) for the duration of the COVID-19 declaration under Section 564(b)(1) of the Act, 21 U.S.C. section 360bbb-3(b)(1), unless the authorization is terminated or revoked.  Performed at [facility], [address], [place], [place] [address]   MRSA Next Gen by PCR, Nasal     Status: None   Collection Time: 12/14/22 12:16 AM   Specimen: Nasal Mucosa; Nasal Swab  Result Value Ref Range Status   MRSA by PCR Next Gen NOT DETECTED NOT DETECTED Final    Comment: (NOTE) The GeneXpert MRSA Assay (FDA approved for NASAL specimens only), is one component of a comprehensive MRSA colonization surveillance program. It is not intended to diagnose MRSA infection nor to guide or monitor treatment for MRSA infections. Test performance is not FDA approved in patients less than 23 years old. Performed at [facility], [address], [place], [place] [address]      Labs: BNP (last 3 results) Recent Labs    01/20/22 1751 08/26/22 0329  BNP 58.4 134.0*   Basic Metabolic Panel: Recent Labs  Lab 12/13/22 1724 12/14/22 0720  NA 135 133*  K 3.5 3.2*  CL 102 106  CO2 23 20*  GLUCOSE 140* 130*  BUN 17 13  CREATININE 0.76 0.74  CALCIUM 9.0 8.4*  MG 1.6* 1.8   Liver Function Tests: Recent  Labs  Lab 12/13/22 1724 12/14/22 0720  AST 20 22  ALT 13 15  ALKPHOS 94 87  BILITOT 1.9* 1.8*  PROT 6.5 5.7*  ALBUMIN 3.4* 2.9*   Recent Labs  Lab 12/13/22 1724  LIPASE 39   No results for input(s): "AMMONIA" in the last 168 hours. CBC: Recent Labs  Lab 12/13/22 1724 12/14/22 0720  WBC 9.4 6.6   NEUTROABS 7.0  --   HGB 12.6* 11.5*  HCT 37.5* 32.9*  MCV 95.2 92.9  PLT 99* 87*   Cardiac Enzymes: No results for input(s): "CKTOTAL", "CKMB", "CKMBINDEX", "TROPONINI" in the last 168 hours. BNP: Invalid input(s): "POCBNP" CBG: Recent Labs  Lab 12/13/22 1645 12/14/22 0630 12/14/22 1132  GLUCAP 135* 132* 137*   D-Dimer No results for input(s): "DDIMER" in the last 72 hours. Hgb A1c No results for input(s): "HGBA1C" in the last 72 hours. Lipid Profile No results for input(s): "CHOL", "HDL", "LDLCALC", "TRIG", "CHOLHDL", "LDLDIRECT" in the last 72 hours. Thyroid function studies No results for input(s): "TSH", "T4TOTAL", "T3FREE", "THYROIDAB" in the last 72 hours.  Invalid input(s): "FREET3" Anemia work up No results for input(s): "VITAMINB12", "FOLATE", "FERRITIN", "TIBC", "IRON", "RETICCTPCT" in the last 72 hours. Urinalysis    Component Value Date/Time   COLORURINE AMBER (A) 08/25/2022 2123   APPEARANCEUR CLEAR 08/25/2022 2123   LABSPEC 1.021 08/25/2022 2123   PHURINE 6.0 08/25/2022 2123   GLUCOSEU NEGATIVE 08/25/2022 2123   HGBUR NEGATIVE 08/25/2022 2123   BILIRUBINUR NEGATIVE 08/25/2022 2123   KETONESUR NEGATIVE 08/25/2022 2123   PROTEINUR NEGATIVE 08/25/2022 2123   NITRITE NEGATIVE 08/25/2022 2123   LEUKOCYTESUR NEGATIVE 08/25/2022 2123   Sepsis Labs Recent Labs  Lab 12/13/22 1724 12/14/22 0720  WBC 9.4 6.6   Microbiology Recent Results (from the past 240 hour(s))  Resp panel by RT-PCR (RSV, Flu A&B, Covid) Anterior Nasal Swab     Status: None   Collection Time: 12/13/22  4:15 PM   Specimen: Anterior Nasal Swab  Result Value Ref Range Status   SARS Coronavirus 2 by RT PCR NEGATIVE NEGATIVE Final   Influenza A by PCR NEGATIVE NEGATIVE Final   Influenza B by PCR NEGATIVE NEGATIVE Final    Comment: (NOTE) The Xpert Xpress SARS-CoV-2/FLU/RSV plus assay is intended as an aid in the diagnosis of influenza from Nasopharyngeal swab specimens and should  not be used as a sole basis for treatment. Nasal washings and aspirates are unacceptable for Xpert Xpress SARS-CoV-2/FLU/RSV testing.  Fact Sheet for Patients: BloggerCourse.com  Fact Sheet for Healthcare Providers: SeriousBroker.it  This test is not yet approved or cleared by the [place] FDA and has been authorized for detection and/or diagnosis of SARS-CoV-2 by FDA under an Emergency Use Authorization (EUA). This EUA will remain in effect (meaning this test can be used) for the duration of the COVID-19 declaration under Section 564(b)(1) of the Act, 21 U.S.C. section 360bbb-3(b)(1), unless the authorization is terminated or revoked.     Resp Syncytial Virus by PCR NEGATIVE NEGATIVE Final    Comment: (NOTE) Fact Sheet for Patients: BloggerCourse.com  Fact Sheet for Healthcare Providers: SeriousBroker.it  This test is not yet approved or cleared by the [place] FDA and has been authorized for detection and/or diagnosis of SARS-CoV-2 by FDA under an Emergency Use Authorization (EUA). This EUA will remain in effect (meaning this test can be used) for the duration of the COVID-19 declaration under Section 564(b)(1) of the Act, 21 U.S.C. section 360bbb-3(b)(1), unless the authorization is terminated or revoked.  Performed at [facility], [address], [place], [place] [address]   MRSA Next Gen by PCR, Nasal     Status: None   Collection Time: 12/14/22 12:16 AM   Specimen: Nasal Mucosa; Nasal Swab  Result Value Ref Range Status   MRSA by PCR Next Gen NOT DETECTED NOT DETECTED Final    Comment: (NOTE) The GeneXpert MRSA Assay (FDA approved for NASAL specimens only), is one component of a comprehensive MRSA colonization surveillance program. It is not intended to diagnose MRSA infection nor to guide or monitor treatment for MRSA infections. Test performance is  not FDA approved in patients less than 2 years old. Performed at [facility], [address] [place], [place] [address]      Time coordinating discharge: 35 minutes  SIGNED:   Glade Lloyd, MD  [facility] 12/14/2022, 1:03 PM

## 2022-12-14 NOTE — Social Work (Signed)
12/14/22 1159  SDOH Interventions  Housing Interventions Inpatient TOC;Patient Declined   CSW met with pt at bedside. Pt confirmed [facility] states he will return there and accepted bus pass. Pt states he has a IT trainer in the community who is working on securing housing. Declines resources at this time. TOC will sign off, please re consult for any additional needs.

## 2022-12-14 NOTE — TOC Transition Note (Signed)
Transition of Care [facility]) - CM/SW Discharge Note   Patient Details  Name: Cory Palmer MRN: [id number] Date of Birth: November 29, 1960  Transition of Care [facility]) CM/SW Contact:  Harriet Masson, RN Phone Number: 12/14/2022, 1:45 PM   Clinical Narrative:     Spoke to patient regarding transition needs. Patient lives in front of the [employer]. Patient states partner for homelessness is working on finding him housing.  Patient's PCP is Debria Garret at [facility]. Apt has been made and information on AVS. [facility]  has transportation and will pick up patient prior to apt.  Patient's insurance will not pay for new wheelchair since he has received one in the last 5 years.  Bus pass provided for discharge.  Final next level of care: Other (comment) (homeless, Engineering geologist) Barriers to Discharge: Barriers Resolved   Patient Goals and CMS Choice    homeless  Discharge Placement    Return to [employer] [facility] Additional resources added to the After Visit Summary for   In-house Referral: PCP / Management consultant Discharge Planning Services: Follow-up appt scheduled                                 Social Determinants of Health (SDOH) Interventions SDOH Screenings   Food Insecurity: Unknown (05/07/2018)  Housing: High Risk (08/27/2022)  Transportation Needs: Unknown (05/07/2018)  Depression (PHQ2-9): High Risk (05/16/2022)  Financial Resource Strain: Unknown (05/07/2018)  Physical Activity: Unknown (05/07/2018)  Tobacco Use: High Risk (12/13/2022)     Readmission Risk Interventions    11/18/2021    4:15 PM  Readmission Risk Prevention Plan  Transportation Screening Complete  Medication Review (RN Care Manager) Complete  PCP or Specialist appointment within 3-5 days of discharge Complete  HRI or Home Care Consult Complete  SW Recovery Care/Counseling Consult Complete  Palliative Care Screening Not  Applicable

## 2022-12-14 NOTE — Progress Notes (Signed)
Iv removed. Discharge instructions given and all questions answered. TOC meds delivered to patient. Patient discharged to [place] [facility] via home wheelchair to the bus stop.

## 2022-12-14 NOTE — Discharge Instructions (Signed)
Information on my medicine - ELIQUIS (apixaban)  This medication education was reviewed with me or my healthcare representative as part of my discharge preparation.  The pharmacist that spoke with me during my hospital stay was:  Leander Rams, [facility]  Why was Eliquis prescribed for you? Eliquis was prescribed to treat blood clots that may have been found in the veins of your legs (deep vein thrombosis) or in your lungs (pulmonary embolism) and to reduce the risk of them occurring again.  What do You need to know about Eliquis ? The starting dose is 10 mg (two 5 mg tablets) taken TWICE daily for the FIRST SEVEN (7) DAYS, then on 12/21/22  the dose is reduced to ONE 5 mg tablet taken TWICE daily.  Eliquis may be taken with or without food.   Try to take the dose about the same time in the morning and in the evening. If you have difficulty swallowing the tablet whole please discuss with your pharmacist how to take the medication safely.  Take Eliquis exactly as prescribed and DO NOT stop taking Eliquis without talking to the doctor who prescribed the medication.  Stopping may increase your risk of developing a new blood clot.  Refill your prescription before you run out.  After discharge, you should have regular check-up appointments with your healthcare provider that is prescribing your Eliquis.    What do you do if you miss a dose? If a dose of ELIQUIS is not taken at the scheduled time, take it as soon as possible on the same day and twice-daily administration should be resumed. The dose should not be doubled to make up for a missed dose.  Important Safety Information A possible side effect of Eliquis is bleeding. You should call your healthcare provider right away if you experience any of the following: Bleeding from an injury or your nose that does not stop. Unusual colored urine (red or dark brown) or unusual colored stools (red or black). Unusual bruising for unknown reasons. A  serious fall or if you hit your head (even if there is no bleeding).  Some medicines may interact with Eliquis and might increase your risk of bleeding or clotting while on Eliquis. To help avoid this, consult your healthcare provider or pharmacist prior to using any new prescription or non-prescription medications, including herbals, vitamins, non-steroidal anti-inflammatory drugs (NSAIDs) and supplements.  This website has more information on Eliquis (apixaban): http://www.eliquis.com/eliquis/home  ============================  Pulmonary Embolism    A pulmonary embolism (PE) is a sudden blockage or decrease of blood flow in one or both lungs. Most blockages come from a blood clot that forms in the vein of a lower leg, thigh, or arm (deep vein thrombosis, DVT) and travels to the lungs. A clot is blood that has thickened into a gel or solid. PE is a dangerous and life-threatening condition that needs to be treated right away.  What are the causes? This condition is usually caused by a blood clot that forms in a vein and moves to the lungs. In rare cases, it may be caused by air, fat, part of a tumor, or other tissue that moves through the veins and into the lungs.  What increases the risk? The following factors may make you more likely to develop this condition: Experiencing a traumatic injury, such as breaking a hip or leg. Having: A spinal cord injury. Orthopedic surgery, especially hip or knee replacement. Any major surgery. A stroke. DVT. Blood clots or blood clotting disease.  Long-term (chronic) lung or heart disease. Cancer treated with chemotherapy. A central venous catheter. Taking medicines that contain estrogen. These include birth control pills and hormone replacement therapy. Being: Pregnant. In the period of time after your baby is delivered (postpartum). Older than age 59. Overweight. A smoker, especially if you have other risks.  What are the signs or  symptoms? Symptoms of this condition usually start suddenly and include: Shortness of breath during activity or at rest. Coughing, coughing up blood, or coughing up blood-tinged mucus. Chest pain that is often worse with deep breaths. Rapid or irregular heartbeat. Feeling light-headed or dizzy. Fainting. Feeling anxious. Fever. Sweating. Pain and swelling in a leg. This is a symptom of DVT, which can lead to PE. How is this diagnosed? This condition may be diagnosed based on: Your medical history. A physical exam. Blood tests. CT pulmonary angiogram. This test checks blood flow in and around your lungs. Ventilation-perfusion scan, also called a lung VQ scan. This test measures air flow and blood flow to the lungs. An ultrasound of the legs.  How is this treated? Treatment for this condition depends on many factors, such as the cause of your PE, your risk for bleeding or developing more clots, and other medical conditions you have. Treatment aims to remove, dissolve, or stop blood clots from forming or growing larger. Treatment may include: Medicines, such as: Blood thinning medicines (anticoagulants) to stop clots from forming. Medicines that dissolve clots (thrombolytics). Procedures, such as: Using a flexible tube to remove a blood clot (embolectomy) or to deliver medicine to destroy it (catheter-directed thrombolysis). Inserting a filter into a large vein that carries blood to the heart (inferior vena cava). This filter (vena cava filter) catches blood clots before they reach the lungs. Surgery to remove the clot (surgical embolectomy). This is rare. You may need a combination of immediate, long-term (up to 3 months after diagnosis), and extended (more than 3 months after diagnosis) treatments. Your treatment may continue for several months (maintenance therapy). You and your health care provider will work together to choose the treatment program that is best for you.  Follow these  instructions at home: Medicines Take over-the-counter and prescription medicines only as told by your health care provider. If you are taking an anticoagulant medicine: Take the medicine every day at the same time each day. Understand what foods and drugs interact with your medicine. Understand the side effects of this medicine, including excessive bruising or bleeding. Ask your health care provider or pharmacist about other side effects.  General instructions Wear a medical alert bracelet or carry a medical alert card that says you have had a PE and lists what medicines you take. Ask your health care provider when you may return to your normal activities. Avoid sitting or lying for a long time without moving. Maintain a healthy weight. Ask your health care provider what weight is healthy for you. Do not use any products that contain nicotine or tobacco, such as cigarettes, e-cigarettes, and chewing tobacco. If you need help quitting, ask your health care provider. Talk with your health care provider about any travel plans. It is important to make sure that you are still able to take your medicine while on trips. Keep all follow-up visits as told by your health care provider. This is important.  Contact a health care provider if: You missed a dose of your blood thinner medicine.  Get help right away if: You have: New or increased pain, swelling, warmth, or redness  in an arm or leg. Numbness or tingling in an arm or leg. Shortness of breath during activity or at rest. A fever. Chest pain. A rapid or irregular heartbeat. A severe headache. Vision changes. A serious fall or accident, or you hit your head. Stomach (abdominal) pain. Blood in your vomit, stool, or urine. A cut that will not stop bleeding. You cough up blood. You feel light-headed or dizzy. You cannot move your arms or legs. You are confused or have memory loss.  These symptoms may represent a serious problem that is  an emergency. Do not wait to see if the symptoms will go away. Get medical help right away. Call your local emergency services (911 in the [place] Do not drive yourself to the hospital. Summary A pulmonary embolism (PE) is a sudden blockage or decrease of blood flow in one or both lungs. PE is a dangerous and life-threatening condition that needs to be treated right away. Treatments for this condition usually include medicines to thin your blood (anticoagulants) or medicines to break apart blood clots (thrombolytics). If you are given blood thinners, it is important to take the medicine every day at the same time each day. Understand what foods and drugs interact with any medicines that you are taking. If you have signs of PE or DVT, call your local emergency services (911 in the [place] This information is not intended to replace advice given to you by your health care provider. Make sure you discuss any questions you have with your health care provider. Document Revised: 04/25/2018 Document Reviewed: 04/25/2018 Elsevier Patient Education  2020 [employer].

## 2022-12-14 NOTE — TOC Benefit Eligibility Note (Signed)
Patient Advocate Encounter  Insurance verification completed.    The patient is currently admitted and upon discharge could be taking Eliquis 5 mg.  The current 30 day co-pay is $0.00.   The patient is currently admitted and upon discharge could be taking Xarelto 20 mg.  The current 30 day co-pay is $0.00.   The patient is insured through [employer] Medicare Part D   This test claim was processed through [facility]- copay amounts may vary at other pharmacies due to pharmacy/plan contracts, or as the patient moves through the different stages of their insurance plan.  Roselynne Lortz, CPHT Pharmacy Patient Advocate Specialist [facility] Patient Advocate Team Direct Number: [phone]  Fax: [phone]       

## 2022-12-14 NOTE — Plan of Care (Signed)

## 2022-12-28 LAB — HEMOGLOBIN A1C: Hemoglobin A1C: 6.6

## 2022-12-28 LAB — AMB RESULTS CONSOLE CBG: Glucose: 194

## 2022-12-28 NOTE — Progress Notes (Signed)
Resources given  -pt takes Eliquis and states this may be cause of low BP  SDOH resources given for housing food and transportation

## 2023-01-16 ENCOUNTER — Emergency Department (HOSPITAL_COMMUNITY)
Admission: EM | Admit: 2023-01-16 | Discharge: 2023-01-16 | Disposition: A | Discharge status: Home / Self Care | Payer: 59 | Attending: Emergency Medicine | Admitting: Emergency Medicine

## 2023-01-16 ENCOUNTER — Encounter (HOSPITAL_COMMUNITY): Payer: Self-pay

## 2023-01-16 ENCOUNTER — Other Ambulatory Visit (HOSPITAL_COMMUNITY): Payer: Self-pay

## 2023-01-16 DIAGNOSIS — Z5900 Homelessness unspecified: Secondary | ICD-10-CM | POA: Diagnosis not present

## 2023-01-16 DIAGNOSIS — R000 Tachycardia, unspecified: Secondary | ICD-10-CM | POA: Diagnosis present

## 2023-01-16 MED ORDER — APIXABAN (ELIQUIS) VTE STARTER PACK (10MG AND 5MG): ORAL_TABLET | ORAL | 0 refills | Status: DC

## 2023-01-16 MED ORDER — METFORMIN HCL 500 MG PO TABS: 500.0000 mg | ORAL_TABLET | Freq: Two times a day (BID) | ORAL | 0 refills | Status: DC

## 2023-01-16 MED ORDER — APIXABAN (ELIQUIS) VTE STARTER PACK (10MG AND 5MG)
ORAL_TABLET | ORAL | 0 refills | Status: DC
  Filled 2023-01-16 (×2): qty 74, 30d supply, fill #0

## 2023-01-16 MED ORDER — QUETIAPINE FUMARATE 300 MG PO TABS
300.0000 mg | ORAL_TABLET | Freq: Two times a day (BID) | ORAL | 0 refills | Status: DC
  Filled 2023-01-16 (×2): qty 60, 30d supply, fill #0

## 2023-01-16 MED ORDER — TRAZODONE HCL 50 MG PO TABS: 50.0000 mg | ORAL_TABLET | Freq: Every evening | ORAL | 0 refills | Status: DC | PRN

## 2023-01-16 MED ORDER — QUETIAPINE FUMARATE 300 MG PO TABS: 300.0000 mg | ORAL_TABLET | Freq: Two times a day (BID) | ORAL | 0 refills | Status: DC

## 2023-01-16 MED ORDER — TRAZODONE HCL 50 MG PO TABS
50.0000 mg | ORAL_TABLET | Freq: Every evening | ORAL | 0 refills | Status: DC | PRN
  Filled 2023-01-16 (×2): qty 30, 30d supply, fill #0

## 2023-01-16 MED ORDER — METFORMIN HCL 500 MG PO TABS
500.0000 mg | ORAL_TABLET | Freq: Two times a day (BID) | ORAL | 0 refills | Status: DC
  Filled 2023-01-16 (×2): qty 60, 30d supply, fill #0

## 2023-01-16 NOTE — Discharge Planning (Signed)
RNCM consulted regarding insured pt requiring maintenance Rx.  RNCM suggests sending to [facility] to fill and bring to him a bedside prior to discharge.

## 2023-01-16 NOTE — ED Notes (Signed)
Pt received medications from pharmacy, AVS and meds reviwed with pt. Pt stable and verbalized understanding. Pt given bus pass and wheeled to [place].

## 2023-01-16 NOTE — ED Triage Notes (Signed)
Pt BIB [facility] from [facility] for Afib, pt has hx of same, non compliant with eliquis x1 week d/t diarrhea and chest burning. 

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
## 2023-01-16 NOTE — Discharge Instructions (Addendum)
Information on my medicine - ELIQUIS (apixaban)  This medication education was reviewed with me or my healthcare representative as part of my discharge preparation.  The pharmacist that spoke with me during my hospital stay was:  Marely Apgar, RPH  Why was Eliquis prescribed for you? Eliquis was prescribed for you to reduce the risk of forming blood clots that can cause a stroke if you have a medical condition called atrial fibrillation (a type of irregular heartbeat) OR to reduce the risk of a blood clots forming after orthopedic surgery.  What do You need to know about Eliquis ? Take your Eliquis TWICE DAILY - one tablet in the morning and one tablet in the evening with or without food.  It would be best to take the doses about the same time each day.  If you have difficulty swallowing the tablet whole please discuss with your pharmacist how to take the medication safely.  Take Eliquis exactly as prescribed by your doctor and DO NOT stop taking Eliquis without talking to the doctor who prescribed the medication.  Stopping may increase your risk of developing a new clot or stroke.  Refill your prescription before you run out.  After discharge, you should have regular check-up appointments with your healthcare provider that is prescribing your Eliquis.  In the future your dose may need to be changed if your kidney function or weight changes by a significant amount or as you get older.  What do you do if you miss a dose? If you miss a dose, take it as soon as you remember on the same day and resume taking twice daily.  Do not take more than one dose of ELIQUIS at the same time.  Important Safety Information A possible side effect of Eliquis is bleeding. You should call your healthcare provider right away if you experience any of the following: Bleeding from an injury or your nose that does not stop. Unusual colored urine (red or dark brown) or unusual colored stools (red or  black). Unusual bruising for unknown reasons. A serious fall or if you hit your head (even if there is no bleeding).  Some medicines may interact with Eliquis and might increase your risk of bleeding or clotting while on Eliquis. To help avoid this, consult your healthcare provider or pharmacist prior to using any new prescription or non-prescription medications, including herbals, vitamins, non-steroidal anti-inflammatory drugs (NSAIDs) and supplements.  This website has more information on Eliquis (apixaban): http://www.eliquis.com/eliquis/home   

## 2023-01-23 NOTE — Progress Notes (Signed)
Pt attended screening event on 12/28/22, where screening results were BP 96/56, Glucose 194, and A1C 6.6. At the event, Pt shared that he takes Eliquis and states this may be cause of low BP. Pt indicated housing, food, and transportation insecurities and was provided with SDOH resources at the event. Per chart review, Estevan Oaks, NP at [facility] is listed as PCP in [facility]. Pt address is listed as [place]. Pt was contacted by phone for PCP, Screening results, and SDOH needs but Pt contact numbers was not in service. Health equity team member contacted [facility] to verify if Pt was established at the office. [facility] front office desk inform caller that Pt is established at the practice and was seen on 01/16/23. Burman Nieves, Manson Passey, NP was confirmed as the Pt's PCP. Front office desk was able to connect caller with extension of clinic staff named Mickeal Skinner, who was able to send message to clinic staff about Pt screening result and SDOH needs. Mickeal Skinner also shared that [employer] street clinic staff will follow up with health equity team member. A letter was also sent to Pt inform him that his screening result is within normal range and indicated that he need to follow up with a doctor. Caller received call from [facility] street clinical staff. Caller was informed that Pt came with behavioral health response team with city [place] to his appointment and she was helping him with his medication and leg.

## 2023-01-27 ENCOUNTER — Other Ambulatory Visit (HOSPITAL_COMMUNITY): Payer: Self-pay

## 2023-02-08 ENCOUNTER — Encounter: Payer: Self-pay | Admitting: Family

## 2023-02-08 ENCOUNTER — Ambulatory Visit (INDEPENDENT_AMBULATORY_CARE_PROVIDER_SITE_OTHER): Payer: 59 | Admitting: Family

## 2023-02-08 DIAGNOSIS — Z89512 Acquired absence of left leg below knee: Secondary | ICD-10-CM

## 2023-02-08 DIAGNOSIS — Z89511 Acquired absence of right leg below knee: Secondary | ICD-10-CM | POA: Diagnosis not present

## 2023-02-08 DIAGNOSIS — S88112D Complete traumatic amputation at level between knee and ankle, left lower leg, subsequent encounter: Secondary | ICD-10-CM

## 2023-02-08 NOTE — Progress Notes (Signed)
Office Visit Note   Patient: Cory Palmer           Date of Birth: Jun 16, 1961           MRN: [id number] Visit Date: 02/08/2023              Requested by: Estevan Oaks, NP [address] [place],  [place] [address] PCP: Estevan Oaks, NP  Chief Complaint  Patient presents with   Left Leg - Follow-up    Left BKA Needs new Rx for prosthetic   Right Leg - Follow-up    Right BKA      HPI: The patient is a 61 year old gentleman seen status post bilateral below-knee amputations.  He states that he did have prostheses fabricated at 1 point but due to transportation issues he was unable to attend physical therapy visits and ultimately did not take position of his prostheses from the prosthetic company.  He today is requesting orders for new prostheses to be fabricated.  Has transportation now that he can get to physical therapy for visits  Patient is an existing bilateral transtibial  amputee.  Patient's current comorbidities are not expected to impact the ability to function with the prescribed prosthesis. Patient verbally communicates a strong desire to use a prosthesis. Patient currently requires mobility aids to ambulate without a prosthesis.  Expects not to use mobility aids with a new prosthesis.  Patient is a K2 level ambulator that will use a prosthesis to walk around their home and the community over low level environmental barriers.      Assessment & Plan: Visit Diagnoses: No diagnosis found.  Plan will follow-up in the office with [place] as needed.  Given an order for new prostheses set up to [facility] clinic.  Follow-Up Instructions: No follow-ups on file.   Ortho Exam  Patient is alert, oriented, no adenopathy, well-dressed, normal affect, normal respiratory effort. On examination bilateral residual limbs these are well-healed there is some trace edema present would benefit from shrinkers.  No impending ulceration.  Imaging: No results found. No  images are attached to the encounter.  Labs: Lab Results  Component Value Date   HGBA1C 6.6 12/28/2022   HGBA1C 6.5 (H) 08/26/2022   HGBA1C 6.2 05/16/2022   ESRSEDRATE 10 03/24/2022   ESRSEDRATE 34 (H) 08/15/2019   ESRSEDRATE 31 (H) 08/05/2019   CRP 6.5 (H) 03/24/2022   CRP 15.0 (H) 08/15/2019   CRP 11.0 (H) 08/05/2019   REPTSTATUS 03/29/2022 FINAL 03/24/2022   GRAMSTAIN  11/19/2019    RARE WBC PRESENT, PREDOMINANTLY PMN RARE GRAM POSITIVE COCCI    CULT  03/24/2022    NO GROWTH 5 DAYS Performed at [facility], [address] [place], [place] [address]    LABORGA METHICILLIN RESISTANT STAPHYLOCOCCUS AUREUS 11/19/2019     Lab Results  Component Value Date   ALBUMIN 2.9 (L) 12/14/2022   ALBUMIN 3.4 (L) 12/13/2022   ALBUMIN 3.5 12/05/2022   PREALBUMIN 11.2 (L) 05/08/2018    Lab Results  Component Value Date   MG 1.8 12/14/2022   MG 1.6 (L) 12/13/2022   MG 1.9 08/26/2022   No results found for: "VD25OH"  Lab Results  Component Value Date   PREALBUMIN 11.2 (L) 05/08/2018      Latest Ref Rng & Units 12/14/2022    7:20 AM 12/13/2022    5:24 PM 12/05/2022   11:42 AM  CBC EXTENDED  WBC 4.0 - 10.5 K/uL 6.6  9.4  7.1   RBC 4.22 - 5.81 MIL/uL 3.54  3.94  3.82   Hemoglobin 13.0 - 17.0 g/dL 40.9  81.1  91.4   HCT 39.0 - 52.0 % 32.9  37.5  36.6   Platelets 150 - 400 K/uL 87  99  87   NEUT# 1.7 - 7.7 K/uL  7.0  6.1   Lymph# 0.7 - 4.0 K/uL  1.4  0.5      There is no height or weight on file to calculate BMI.  Orders:  No orders of the defined types were placed in this encounter.  No orders of the defined types were placed in this encounter.    Procedures: No procedures performed  Clinical Data: No additional findings.  ROS:  All other systems negative, except as noted in the HPI. Review of Systems  Objective: Vital Signs: There were no vitals taken for this visit.  Specialty Comments:  No specialty comments available.  PMFS History: Patient  Active Problem List   Diagnosis Date Noted   Acute pulmonary embolism (HCC) 12/13/2022   Tobacco use 12/13/2022   Substance induced mood disorder (HCC) 11/08/2022   Pneumonia 08/26/2022   Suicide ideation 06/26/2022   Alcohol abuse 04/10/2022   Cocaine abuse with cocaine-induced mood disorder (HCC) 04/10/2022   Adjustment disorder with mixed anxiety and depressed mood 04/10/2022   Alcohol-induced mood disorder (HCC) 04/10/2022   Severe sepsis (HCC) 03/24/2022   Abdominal pain 03/24/2022   Nausea vomiting and diarrhea 03/24/2022   Prolonged QT interval 03/24/2022   Macrocytosis without anemia 03/24/2022   Sepsis (HCC) 03/24/2022   Acute hepatic encephalopathy (HCC) 11/17/2021   PVD (peripheral vascular disease) (HCC) 11/17/2021   Metabolic acidosis 11/17/2021   Esophagitis 11/17/2021   Acute metabolic encephalopathy 11/11/2021   Thrombocytopenia (HCC) 11/11/2021   Leukocytosis 10/10/2020   GERD (gastroesophageal reflux disease) 10/10/2020   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HCC) 05/06/2020   Hyponatremia 10/23/2019   MRSA bacteremia 10/21/2019   Atrial flutter with rapid ventricular response (HCC) 10/21/2019   Lactic acidosis 10/21/2019   Severe protein-calorie malnutrition (HCC)    Diabetic polyneuropathy associated with type 2 diabetes mellitus (HCC)    Cirrhosis of liver (HCC) 10/19/2019   Anemia 10/19/2019   Medication monitoring encounter 08/15/2019   Serum total bilirubin elevated 10/29/2018   BPH (benign prostatic hyperplasia) 10/29/2018   Below-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)    PAF (paroxysmal atrial fibrillation) (HCC)    Hypokalemia    Hypomagnesemia    Sepsis without acute organ dysfunction (HCC) 10/05/2018   Depressed bipolar disorder (HCC) 10/05/2018   Hypertension 10/05/2018   Hyperbilirubinemia 10/05/2018   Homelessness 05/07/2018   Diabetes mellitus type II, non insulin dependent (HCC) 05/07/2018   Past Medical History:   Diagnosis Date   Anxiety    Depressed bipolar disorder (HCC)    Diabetes mellitus without complication (HCC)    Hypertension    Liver cirrhosis (HCC)    Osteomyelitis of left foot (HCC) 10/19/2019   Osteomyelitis of toe of left foot (HCC)    PAF (paroxysmal atrial fibrillation) (HCC)    S/P transmetatarsal amputation of foot, 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    Family History  Problem Relation Age of Onset   Hypertension Other    Diabetes Mellitus II Other     Past Surgical History:  Procedure Laterality Date   AMPUTATION Left 09/28/2018   Procedure: LEFT TRANSMETATARSAL AMPUTATION;  Surgeon: Nadara Mustard, MD;  Location[facility] OR;  Service: Orthopedics;  Laterality:  Left;   AMPUTATION Left 10/23/2019   Procedure: AMPUTATION BELOW KNEE;  Surgeon: Nadara Mustard, MD;  Location: [facility] OR;  Service: Orthopedics;  Laterality: Left;   BELOW KNEE LEG AMPUTATION Right    ESOPHAGOGASTRODUODENOSCOPY (EGD) WITH PROPOFOL N/A 10/19/2020   Procedure: ESOPHAGOGASTRODUODENOSCOPY (EGD) WITH PROPOFOL;  Surgeon: Charna Elizabeth, MD;  Location: [facility] ENDOSCOPY;  Service: Endoscopy;  Laterality: N/A;   TEE WITHOUT CARDIOVERSION N/A 10/25/2019   Procedure: TRANSESOPHAGEAL ECHOCARDIOGRAM (TEE);  Surgeon: Lewayne Bunting, MD;  Location: [facility] ENDOSCOPY;  Service: Cardiovascular;  Laterality: N/A;   Social History   Occupational History   Not on file  Tobacco Use   Smoking status: Every Day    Packs/day: .25    Types: Cigarettes    Passive exposure: Current   Smokeless tobacco: Never  Vaping Use   Vaping Use: Never used  Substance and Sexual Activity   Alcohol use: Not Currently   Drug use: Not Currently    Types: Cocaine    Comment: Last used in July 2023   Sexual activity: Not Currently

## 2023-02-12 ENCOUNTER — Emergency Department (HOSPITAL_COMMUNITY)
Admission: EM | Admit: 2023-02-12 | Discharge: 2023-02-13 | Disposition: A | Discharge status: Home / Self Care | Payer: 59 | Attending: Emergency Medicine | Admitting: Emergency Medicine

## 2023-02-12 ENCOUNTER — Other Ambulatory Visit: Payer: Self-pay

## 2023-02-12 ENCOUNTER — Encounter (HOSPITAL_COMMUNITY): Payer: Self-pay

## 2023-02-12 DIAGNOSIS — F1090 Alcohol use, unspecified, uncomplicated: Secondary | ICD-10-CM | POA: Diagnosis not present

## 2023-02-12 DIAGNOSIS — R030 Elevated blood-pressure reading, without diagnosis of hypertension: Secondary | ICD-10-CM

## 2023-02-12 DIAGNOSIS — Z59819 Housing instability, housed unspecified: Secondary | ICD-10-CM | POA: Insufficient documentation

## 2023-02-12 DIAGNOSIS — F1490 Cocaine use, unspecified, uncomplicated: Secondary | ICD-10-CM | POA: Insufficient documentation

## 2023-02-12 DIAGNOSIS — E876 Hypokalemia: Secondary | ICD-10-CM | POA: Insufficient documentation

## 2023-02-12 DIAGNOSIS — D696 Thrombocytopenia, unspecified: Secondary | ICD-10-CM | POA: Diagnosis not present

## 2023-02-12 DIAGNOSIS — I10 Essential (primary) hypertension: Secondary | ICD-10-CM | POA: Insufficient documentation

## 2023-02-12 DIAGNOSIS — Z789 Other specified health status: Secondary | ICD-10-CM

## 2023-02-12 DIAGNOSIS — F439 Reaction to severe stress, unspecified: Secondary | ICD-10-CM | POA: Insufficient documentation

## 2023-02-12 DIAGNOSIS — Z7901 Long term (current) use of anticoagulants: Secondary | ICD-10-CM | POA: Diagnosis not present

## 2023-02-12 DIAGNOSIS — I482 Chronic atrial fibrillation, unspecified: Secondary | ICD-10-CM | POA: Insufficient documentation
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
## 2023-02-13 ENCOUNTER — Encounter (HOSPITAL_COMMUNITY): Payer: Self-pay | Admitting: Emergency Medicine

## 2023-02-13 LAB — URINALYSIS, ROUTINE W REFLEX MICROSCOPIC
Bacteria, UA: NONE SEEN
Bilirubin Urine: NEGATIVE
Glucose, UA: NEGATIVE mg/dL
Ketones, ur: NEGATIVE mg/dL
Leukocytes,Ua: NEGATIVE
Nitrite: NEGATIVE
Protein, ur: NEGATIVE mg/dL
Specific Gravity, Urine: 1.02 (ref 1.005–1.030)
pH: 6 (ref 5.0–8.0)

## 2023-02-13 LAB — COMPREHENSIVE METABOLIC PANEL
ALT: 13 U/L (ref 0–44)
AST: 21 U/L (ref 15–41)
Albumin: 3.5 g/dL (ref 3.5–5.0)
Alkaline Phosphatase: 85 U/L (ref 38–126)
Anion gap: 11 (ref 5–15)
BUN: 8 mg/dL (ref 8–23)
CO2: 19 mmol/L — ABNORMAL LOW (ref 22–32)
Calcium: 9 mg/dL (ref 8.9–10.3)
Chloride: 111 mmol/L (ref 98–111)
Creatinine, Ser: 0.76 mg/dL (ref 0.61–1.24)
GFR, Estimated: >60 mL/min (ref >60)
Glucose, Bld: 201 mg/dL — ABNORMAL HIGH (ref 70–99)
Potassium: 3.3 mmol/L — ABNORMAL LOW (ref 3.5–5.1)
Sodium: 141 mmol/L (ref 135–145)
Total Bilirubin: 1.6 mg/dL — ABNORMAL HIGH (ref 0.3–1.2)
Total Protein: 6.6 g/dL (ref 6.5–8.1)

## 2023-02-13 LAB — CBC
HCT: 37.3 % — ABNORMAL LOW (ref 39.0–52.0)
Hemoglobin: 12.7 g/dL — ABNORMAL LOW (ref 13.0–17.0)
MCH: 33.2 pg (ref 26.0–34.0)
MCHC: 34 g/dL (ref 30.0–36.0)
MCV: 97.4 fL (ref 80.0–100.0)
Platelets: 82 10*3/uL — ABNORMAL LOW (ref 150–400)
RBC: 3.83 MIL/uL — ABNORMAL LOW (ref 4.22–5.81)
RDW: 13.4 % (ref 11.5–15.5)
WBC: 4.6 10*3/uL (ref 4.0–10.5)
nRBC: 0 % (ref 0.0–0.2)

## 2023-02-13 LAB — RAPID URINE DRUG SCREEN, HOSP PERFORMED
Amphetamines: NOT DETECTED
Barbiturates: NOT DETECTED
Benzodiazepines: NOT DETECTED
Cocaine: POSITIVE — AB
Opiates: NOT DETECTED
Tetrahydrocannabinol: POSITIVE — AB

## 2023-02-13 LAB — ETHANOL: Alcohol, Ethyl (B): 37 mg/dL — ABNORMAL HIGH (ref <10)

## 2023-02-13 LAB — TROPONIN I (HIGH SENSITIVITY): Troponin I (High Sensitivity): 2 ng/L (ref <18)

## 2023-02-13 MED ORDER — POTASSIUM CHLORIDE CRYS ER 20 MEQ PO TBCR
40.0000 meq | EXTENDED_RELEASE_TABLET | Freq: Once | ORAL | Status: AC
  Administered 2023-02-13: 40 meq via ORAL
  Filled 2023-02-13: qty 2

## 2023-02-13 NOTE — Discharge Instructions (Addendum)
It was our pleasure to provide your ER care today - we hope that you feel better. Drink plenty of fluids/stay well hydrated.   Avoid alcohol and drug use as it is harmful to your physical health and mental well being. See resource guide attached in terms of accessing various substance use treatment programs as weel as other social service resources in the area, including shelters/social services as well as behavioral health resources.    Follow up closely with primary care doctor and behavioral health provider in the coming week.  For mental health issues and/or crisis, you may also go directly to the [facility] - they are open 24/7 and walk-ins are welcome.    Your potassium level is mildly low - eat plenty of fruits and vegetables, and follow up with primary care doctor in one week.   Return to ER if worse, new symptoms, fevers, chest pain, trouble breathing, or other emergency concern.
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
## 2023-06-08 ENCOUNTER — Emergency Department (HOSPITAL_COMMUNITY): Payer: Medicare (Managed Care)

## 2023-06-08 ENCOUNTER — Emergency Department (HOSPITAL_COMMUNITY)
Admission: EM | Admit: 2023-06-08 | Discharge: 2023-06-12 | Disposition: A | Discharge status: Home / Self Care | Payer: Medicare (Managed Care) | Attending: Emergency Medicine | Admitting: Emergency Medicine

## 2023-06-08 DIAGNOSIS — E119 Type 2 diabetes mellitus without complications: Secondary | ICD-10-CM | POA: Insufficient documentation

## 2023-06-08 DIAGNOSIS — F1994 Other psychoactive substance use, unspecified with psychoactive substance-induced mood disorder: Secondary | ICD-10-CM | POA: Diagnosis present

## 2023-06-08 DIAGNOSIS — F1410 Cocaine abuse, uncomplicated: Secondary | ICD-10-CM

## 2023-06-08 DIAGNOSIS — F411 Generalized anxiety disorder: Secondary | ICD-10-CM | POA: Diagnosis present

## 2023-06-08 DIAGNOSIS — Z7984 Long term (current) use of oral hypoglycemic drugs: Secondary | ICD-10-CM | POA: Diagnosis not present

## 2023-06-08 DIAGNOSIS — R45851 Suicidal ideations: Secondary | ICD-10-CM | POA: Insufficient documentation

## 2023-06-08 DIAGNOSIS — Z5900 Homelessness unspecified: Secondary | ICD-10-CM | POA: Insufficient documentation

## 2023-06-08 DIAGNOSIS — F1414 Cocaine abuse with cocaine-induced mood disorder: Secondary | ICD-10-CM | POA: Insufficient documentation

## 2023-06-08 DIAGNOSIS — I10 Essential (primary) hypertension: Secondary | ICD-10-CM | POA: Diagnosis not present

## 2023-06-08 LAB — COMPREHENSIVE METABOLIC PANEL
ALT: 20 U/L (ref 0–44)
AST: 41 U/L (ref 15–41)
Albumin: 3.7 g/dL (ref 3.5–5.0)
Alkaline Phosphatase: 113 U/L (ref 38–126)
Anion gap: 11 (ref 5–15)
BUN: 10 mg/dL (ref 8–23)
CO2: 21 mmol/L — ABNORMAL LOW (ref 22–32)
Calcium: 8.7 mg/dL — ABNORMAL LOW (ref 8.9–10.3)
Chloride: 106 mmol/L (ref 98–111)
Creatinine, Ser: 0.74 mg/dL (ref 0.61–1.24)
GFR, Estimated: >60 mL/min (ref >60)
Glucose, Bld: 79 mg/dL (ref 70–99)
Potassium: 3.6 mmol/L (ref 3.5–5.1)
Sodium: 138 mmol/L (ref 135–145)
Total Bilirubin: 2.5 mg/dL — ABNORMAL HIGH (ref <1.2)
Total Protein: 7.1 g/dL (ref 6.5–8.1)

## 2023-06-08 LAB — ACETAMINOPHEN LEVEL: Acetaminophen (Tylenol), Serum: <10 ug/mL — ABNORMAL LOW (ref 10–30)

## 2023-06-08 LAB — RAPID URINE DRUG SCREEN, HOSP PERFORMED
Amphetamines: NOT DETECTED
Barbiturates: NOT DETECTED
Benzodiazepines: NOT DETECTED
Cocaine: POSITIVE — AB
Opiates: NOT DETECTED
Tetrahydrocannabinol: NOT DETECTED

## 2023-06-08 LAB — ETHANOL: Alcohol, Ethyl (B): <10 mg/dL (ref <10)

## 2023-06-08 LAB — TROPONIN I (HIGH SENSITIVITY)
Troponin I (High Sensitivity): 3 ng/L (ref <18)
Troponin I (High Sensitivity): 3 ng/L (ref <18)

## 2023-06-08 LAB — CBC WITH DIFFERENTIAL/PLATELET
Abs Immature Granulocytes: 0.05 10*3/uL (ref 0.00–0.07)
Basophils Absolute: 0.1 10*3/uL (ref 0.0–0.1)
Basophils Relative: 1 %
Eosinophils Absolute: 0.2 10*3/uL (ref 0.0–0.5)
Eosinophils Relative: 3 %
HCT: 39.8 % (ref 39.0–52.0)
Hemoglobin: 13.8 g/dL (ref 13.0–17.0)
Immature Granulocytes: 1 %
Lymphocytes Relative: 15 %
Lymphs Abs: 1.4 10*3/uL (ref 0.7–4.0)
MCH: 33.7 pg (ref 26.0–34.0)
MCHC: 34.7 g/dL (ref 30.0–36.0)
MCV: 97.1 fL (ref 80.0–100.0)
Monocytes Absolute: 0.5 10*3/uL (ref 0.1–1.0)
Monocytes Relative: 5 %
Neutro Abs: 6.8 10*3/uL (ref 1.7–7.7)
Neutrophils Relative %: 75 %
Platelets: 156 10*3/uL (ref 150–400)
RBC: 4.1 MIL/uL — ABNORMAL LOW (ref 4.22–5.81)
RDW: 13.8 % (ref 11.5–15.5)
WBC: 9 10*3/uL (ref 4.0–10.5)
nRBC: 0 % (ref 0.0–0.2)

## 2023-06-08 LAB — SALICYLATE LEVEL: Salicylate Lvl: <7 mg/dL — ABNORMAL LOW (ref 7.0–30.0)

## 2023-06-08 LAB — MAGNESIUM: Magnesium: 1.7 mg/dL (ref 1.7–2.4)

## 2023-06-08 MED ORDER — HYDROXYZINE HCL 25 MG PO TABS
25.0000 mg | ORAL_TABLET | Freq: Once | ORAL | Status: AC
  Administered 2023-06-08: 25 mg via ORAL
  Filled 2023-06-08: qty 1

## 2023-06-08 MED ORDER — SODIUM CHLORIDE 0.9 % IV BOLUS
500.0000 mL | Freq: Once | INTRAVENOUS | Status: AC
  Administered 2023-06-08: 500 mL via INTRAVENOUS
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
## 2023-06-08 NOTE — ED Notes (Signed)
TTS at bedside. Patient said he has places to be.

## 2023-06-08 NOTE — ED Provider Notes (Signed)
[place] EMERGENCY DEPARTMENT AT [facility] Provider Note   CSN: [id number] Arrival date & time: 06/08/23  1143     History  Chief Complaint  Patient presents with   Drug Problem   Suicidal    Cory Palmer is a 62 y.o. male.  62 year old male presents today for concern of suicidal ideation.  States he has access to firearms.  Endorses binging on cocaine recently.  Endorses some shortness of breath.  No chest pain.  No other complaints.  He states the outpatient resources or trying to establish care with a home however he owes some money to [facility] energy and has to pay by next week.  Would like to speak with behavioral health.  The history is provided by the patient. No language interpreter was used.       Home Medications Prior to Admission medications   Medication Sig Start Date End Date Taking? Authorizing Provider  APIXABAN Everlene Balls) VTE STARTER PACK (10MG  AND 5MG ) Take as directed on package: start with two-5mg  tablets twice daily for 7 days. On day 8, switch to one-5mg  tablet twice daily. 01/16/23   Glyn Ade, MD  metFORMIN (GLUCOPHAGE) 500 MG tablet Take 1 tablet (500 mg total) by mouth 2 (two) times daily with a meal. 01/16/23   Glyn Ade, MD  pantoprazole (PROTONIX) 40 MG tablet Take 40 mg by mouth daily. Patient not taking: Reported on 02/12/2023 10/18/22   [provider]  QUEtiapine (SEROQUEL) 300 MG tablet Take 1 tablet (300 mg total) by mouth 2 (two) times daily. 01/16/23   Glyn Ade, MD  traZODone (DESYREL) 50 MG tablet Take 1 tablet (50 mg total) by mouth at bedtime as needed for sleep. 01/16/23   Glyn Ade, MD      Allergies    Atorvastatin calcium, Gemfibrozil, and Insulin glargine    Review of Systems   Review of Systems  Constitutional:  Negative for chills and fever.  Respiratory:  Positive for shortness of breath.   Cardiovascular:  Negative for chest pain.  Psychiatric/Behavioral:  Positive for suicidal  ideas. Negative for self-injury.   All other systems reviewed and are negative.   Physical Exam Updated Vital Signs BP 119/73 (BP Location: Right Arm)   Pulse 89   Temp 97.9 F (36.6 C) (Oral)   Resp 20   SpO2 96%  Physical Exam Vitals and nursing note reviewed.  Constitutional:      General: He is not in acute distress.    Appearance: Normal appearance. He is not ill-appearing.  HENT:     Head: Normocephalic and atraumatic.     Nose: Nose normal.  Eyes:     Conjunctiva/sclera: Conjunctivae normal.  Cardiovascular:     Rate and Rhythm: Normal rate and regular rhythm.  Pulmonary:     Effort: Pulmonary effort is normal. No respiratory distress.  Musculoskeletal:        General: No deformity.  Skin:    Findings: No rash.  Neurological:     General: No focal deficit present.     Mental Status: He is alert and oriented to person, place, and time.  Psychiatric:        Attention and Perception: Attention normal.        Mood and Affect: Mood normal.        Speech: Speech normal.        Behavior: Behavior normal.        Thought Content: Thought content includes suicidal ideation. Thought content does not  include homicidal ideation. Thought content includes suicidal plan. Thought content does not include homicidal plan.     ED Results / Procedures / Treatments   Labs (all labs ordered are listed, but only abnormal results are displayed) Labs Reviewed  ACETAMINOPHEN LEVEL - Abnormal; Notable for the following components:      Result Value   Acetaminophen (Tylenol), Serum <10 (*)    All other components within normal limits  CBC WITH DIFFERENTIAL/PLATELET - Abnormal; Notable for the following components:   RBC 4.10 (*)    All other components within normal limits  COMPREHENSIVE METABOLIC PANEL - Abnormal; Notable for the following components:   CO2 21 (*)    Calcium 8.7 (*)    Total Bilirubin 2.5 (*)    All other components within normal limits  RAPID URINE DRUG SCREEN,  HOSP PERFORMED - Abnormal; Notable for the following components:   Cocaine POSITIVE (*)    All other components within normal limits  SALICYLATE LEVEL - Abnormal; Notable for the following components:   Salicylate Lvl <7.0 (*)    All other components within normal limits  ETHANOL  MAGNESIUM  TROPONIN I (HIGH SENSITIVITY)  TROPONIN I (HIGH SENSITIVITY)    EKG None  Radiology DG Chest Port 1 View  Result Date: 06/08/2023 CLINICAL DATA:  Dyspnea. EXAM: PORTABLE CHEST 1 VIEW COMPARISON:  Chest radiograph dated Dec 13, 2022. FINDINGS: The heart size and mediastinal contours are within normal limits. Both lungs are clear. No pneumothorax or pleural effusion. No acute osseous abnormality. IMPRESSION: No acute cardiopulmonary findings. Electronically Signed   By: Hart Robinsons M.D.   On: 06/08/2023 14:28    Procedures Procedures    Medications Ordered in ED Medications  hydrOXYzine (ATARAX) tablet 25 mg (25 mg Oral Given 06/08/23 1250)  sodium chloride 0.9 % bolus 500 mL (0 mLs Intravenous Stopped 06/08/23 1417)    ED Course/ Medical Decision Making/ A&P                                 Medical Decision Making Amount and/or Complexity of Data Reviewed Labs: ordered. Radiology: ordered.  Risk Prescription drug management.   62 year old male with past medical history significant for bilateral below the knee amputations, cirrhosis, hypertension, diabetes, bipolar who presents for suicidal ideation, shortness of breath after cocaine abuse.  EKG without acute ischemic change.  Chest x-ray without acute cardiopulmonary process.  CBC without leukocytosis or anemia.  CMP with preserved renal function, normal electrolytes.  Mild elevation in total bilirubin but other LFTs normal.  Always has some chronic bilirubin elevation.  UDS positive for cocaine.  Troponin negative.  Magnesium 1.7.  Salicylate, acetaminophen, ethanol level within normal.  Patient medically cleared for psychiatric  evaluation.  1450: Patient reevaluated.  Resting in bed.  Still expressing passive suicidal ideation.  Hemodynamically stable.  Workup overall reassuring.  According to nurse he would not cooperate with TTS.  Will await their recommendations and note.  From a medical standpoint he is otherwise stable for discharge.  Final Clinical Impression(s) / ED Diagnoses Final diagnoses:  Cocaine abuse (HCC)  Suicidal ideation    Rx / DC Orders ED Discharge Orders     None         Marita Kansas, PA-C 06/08/23 1452    Loetta Rough, MD 06/12/23 [phone]

## 2023-06-08 NOTE — ED Notes (Signed)
[facility] called Angie at [facility] to inquire about pts housing placement progress. Left a HIPAA compliant message to return the call.   BHC called Leitha Schuller at [place] to get collateral about pt. Left a HIPAA compliant message to return the call.   Jacquelynn Cree, [facility] 06/08/23

## 2023-06-08 NOTE — Consult Note (Signed)
[facility] ED ASSESSMENT   Reason for Consult: Psych Consult Referring Physician:  Marita Kansas. PA-C Patient Identification: Cory Palmer MRN:  [id number] ED Chief Complaint: Substance induced mood disorder (HCC)  Diagnosis:  Principal Problem:   Substance induced mood disorder (HCC) Active Problems:   GAD (generalized anxiety disorder)   ED Assessment Time Calculation: Start Time: 1700 Stop Time: 1730 Total Time in Minutes (Assessment Completion): 30   Subjective: Cory Palmer is a 62 y.o. male patient presents today for concern of suicidal ideation. States he has access to firearms. Endorses binging on cocaine recently   HPI:   Cory Palmer, 62 y.o., male patient seen face to face by this provider, consulted with Dr. Lucianne Muss; and chart reviewed on 06/08/23.  On evaluation Cory Palmer reports that he is having suicidal ideation, that is off and on. He states he has been staying in a hotel, and binging off of crack/cocaine and some alcohol. Patient currently denies SI/HI/AVH.  Patient unsure about how much cocaine/crack use used, states he has been drinking a couple of beers.  Patient UDS positive for cocaine, BAL less than 10.  Patient states he has a diagnosis of bipolar, patient states he has not been compliant with medications, and states he has not taken his medication in about 2 months due to not being able to financially afford them.  Patient asked me to call Angie with [facility] and ask about his housing, that they are supposed to assist him with.  He also asked me to call Dane at [facility] for collateral.   During evaluation Cory Palmer is laying in hospital bed in no acute distress. He is alert, oriented x 4, irritable during assessment.  His mood is euthymic with congruent affect. He has appropriate speech, and behavior.  Objectively there is no evidence of psychosis/mania or delusional thinking.  He currently denies suicidal and homicidal ideation, psychosis, and paranoia. Patient has a history  of bilateral amputation, and states it happened due to inconsistency with diabetes.  Patient frequents the ER with mental health issues including suicidal ideation.   [facility] called Angie at [facility] to inquire about pts housing placement progress. Left a HIPAA compliant message to return the call.    BHC called Leitha Schuller at [place] to get collateral about pt. Left a HIPAA compliant message to return the call.       Past Psychiatric History: Depression, anxiety, Bipolar Depression and Polysubstance abuse. Patient was receiving Mental healthcare at [facility] but have not been there for years he says. No outpatient Mental healthcare at this time.    Risk to Self or Others: Is the patient at risk to self? Patient denies  Has the patient been a risk to self in the past 6 months? Patient denies  Has the patient been a risk to self within the distant past? Patient denies  Is the patient a risk to others? Patient denies  Has the patient been a risk to others in the past 6 months? Patient denies  Has the patient been a risk to others within the distant past? Patient denies     [place] Scale:  Flowsheet Row ED from 06/08/2023 in [facility] Emergency Department at [facility] ED from 02[facility]12/2023 in [facility] Emergency Department at [facility] ED from 01/16/2023 in [facility] Emergency Department at [facility]  C-SSRS RISK CATEGORY High Risk High Risk No Risk       AIMS:  , , ,  ,  ASAM:    Substance Abuse:     Past Medical 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    Past Surgical History:  Procedure Laterality Date    AMPUTATION Left 09/28/2018   Procedure: LEFT TRANSMETATARSAL AMPUTATION;  Surgeon: Nadara Mustard, MD;  Location: [facility] OR;  Service: Orthopedics;  Laterality: Left;   AMPUTATION Left 10/23/2019   Procedure: AMPUTATION BELOW KNEE;  Surgeon: Nadara Mustard, MD;  Location: [facility] OR;  Service: Orthopedics;  Laterality: Left;   BELOW KNEE LEG AMPUTATION Right    ESOPHAGOGASTRODUODENOSCOPY (EGD) WITH PROPOFOL N/A 10/19/2020   Procedure: ESOPHAGOGASTRODUODENOSCOPY (EGD) WITH PROPOFOL;  Surgeon: Charna Elizabeth, MD;  Location: [facility] ENDOSCOPY;  Service: Endoscopy;  Laterality: N/A;   TEE WITHOUT CARDIOVERSION N/A 10/25/2019   Procedure: TRANSESOPHAGEAL ECHOCARDIOGRAM (TEE);  Surgeon: Lewayne Bunting, MD;  Location: [facility] ENDOSCOPY;  Service: Cardiovascular;  Laterality: N/A;   Family History:  Family History  Problem Relation Age of Onset   Hypertension Other    Diabetes Mellitus II Other     Social History:  Social History   Substance and Sexual Activity  Alcohol Use Yes     Social History   Substance and Sexual Activity  Drug Use Not Currently   Types: Cocaine   Comment: Last used in July 2023    Social History   Socioeconomic History   Marital status: Single    Spouse name: Not on file   Number of children: Not on file   Years of education: Not on file   Highest education level: Not on file  Occupational History   Not on file  Tobacco Use   Smoking status: Every Day    Current packs/day: 0.25    Types: Cigarettes    Passive exposure: Current   Smokeless tobacco: Never  Vaping Use   Vaping status: Never Used  Substance and Sexual Activity   Alcohol use: Yes   Drug use: Not Currently    Types: Cocaine    Comment: Last used in July 2023   Sexual activity: Not Currently  Other Topics Concern   Not on file  Social History Narrative   Not on file   Social Determinants of Health   Financial Resource Strain: Not on File (11/18/2021)   [facility] [employer] [facility]   Financial Resource  Strain    Financial Resource Strain: 0  Food Insecurity: Not on File (04/27/2023)   Received from [facility] Insecurity    Food: 0  Transportation Needs: Unmet Transportation Needs (12/28/2022)   PRAPARE - Administrator, Civil Service (Medical): Yes    Lack of Transportation (Non-Medical): Yes  Physical Activity: Not on File (11/18/2021)   Received from [place], [place]   Physical Activity    Physical Activity: 0  Stress: Not on File (11/18/2021)   Received from [facility], [place]   Stress    Stress: 0  Social Connections: Not on File (04/15/2023)   Received from [facility]   Social Connections    Connectedness: 0      Allergies:   Allergies  Allergen Reactions   Atorvastatin Calcium  Nausea And Vomiting   Gemfibrozil     Unknown   Insulin Glargine     Unknown    Labs:  Results for orders placed or performed during the hospital encounter of 06/08/23 (from the past 48 hour(s))  Acetaminophen level     Status: Abnormal   Collection Time: 06/08/23  1:00 PM  Result Value Ref Range   Acetaminophen (Tylenol), Serum <10 (L) 10 - 30 ug/mL    Comment: (NOTE) Therapeutic concentrations vary significantly. A range of 10-30 ug/mL  may be an effective concentration for many patients. However, some  are best treated at concentrations outside of this range. Acetaminophen concentrations >150 ug/mL at 4 hours after ingestion  and >50 ug/mL at 12 hours after ingestion are often associated with  toxic reactions.  Performed at [facility], [address], [place], [place] [address]   CBC with Differential/Platelet     Status: Abnormal   Collection Time: 06/08/23  1:00 PM  Result Value Ref Range   WBC 9.0 4.0 - 10.5 K/uL   RBC 4.10 (L) 4.22 - 5.81 MIL/uL   Hemoglobin 13.8 13.0 - 17.0 g/dL   HCT 44.0 34.7 - 42.5 %   MCV 97.1 80.0 - 100.0 fL   MCH 33.7 26.0 - 34.0 pg   MCHC 34.7 30.0 - 36.0 g/dL   RDW 95.6 38.7 - 56.4 %   Platelets 156 150 - 400 K/uL   nRBC 0.0  0.0 - 0.2 %   Neutrophils Relative % 75 %   Neutro Abs 6.8 1.7 - 7.7 K/uL   Lymphocytes Relative 15 %   Lymphs Abs 1.4 0.7 - 4.0 K/uL   Monocytes Relative 5 %   Monocytes Absolute 0.5 0.1 - 1.0 K/uL   Eosinophils Relative 3 %   Eosinophils Absolute 0.2 0.0 - 0.5 K/uL   Basophils Relative 1 %   Basophils Absolute 0.1 0.0 - 0.1 K/uL   Immature Granulocytes 1 %   Abs Immature Granulocytes 0.05 0.00 - 0.07 K/uL    Comment: Performed at [facility], [address], [place], [place] [address]  Comprehensive metabolic panel     Status: Abnormal   Collection Time: 06/08/23  1:00 PM  Result Value Ref Range   Sodium 138 135 - 145 mmol/L   Potassium 3.6 3.5 - 5.1 mmol/L   Chloride 106 98 - 111 mmol/L   CO2 21 (L) 22 - 32 mmol/L   Glucose, Bld 79 70 - 99 mg/dL    Comment: Glucose reference range applies only to samples taken after fasting for at least 8 hours.   BUN 10 8 - 23 mg/dL   Creatinine, Ser 1.88 0.61 - 1.24 mg/dL   Calcium 8.7 (L) 8.9 - 10.3 mg/dL   Total Protein 7.1 6.5 - 8.1 g/dL   Albumin 3.7 3.5 - 5.0 g/dL   AST 41 15 - 41 U/L   ALT 20 0 - 44 U/L   Alkaline Phosphatase 113 38 - 126 U/L   Total Bilirubin 2.5 (H) <1.2 mg/dL   GFR, Estimated >41 >66 mL/min    Comment: (NOTE) Calculated using the CKD-EPI Creatinine Equation (2021)    Anion gap 11 5 - 15    Comment: Performed at [facility], [address] [place], [place] [address]  Ethanol     Status: None   Collection Time: 06/08/23  1:00 PM  Result Value Ref Range   Alcohol, Ethyl (B) <10 <10 mg/dL  Comment: (NOTE) Lowest detectable limit for serum alcohol is 10 mg/dL.  For medical purposes only. Performed at [facility], [address], [place], [place] [address]   Salicylate level     Status: Abnormal   Collection Time: 06/08/23  1:00 PM  Result Value Ref Range   Salicylate Lvl <7.0 (L) 7.0 - 30.0 mg/dL    Comment: Performed at [facility], [address] [place], [place] [address]  Magnesium     Status: None   Collection Time: 06/08/23  1:00 PM  Result Value Ref Range   Magnesium 1.7 1.7 - 2.4 mg/dL    Comment: Performed at [facility], [address], [place], [place] [address]  Troponin I (High Sensitivity)     Status: None   Collection Time: 06/08/23  1:00 PM  Result Value Ref Range   Troponin I (High Sensitivity) 3 <18 ng/L    Comment: (NOTE) Elevated high sensitivity troponin I (hsTnI) values and significant  changes across serial measurements may suggest ACS but many other  chronic and acute conditions are known to elevate hsTnI results.  Refer to the "Links" section for chest pain algorithms and additional  guidance. Performed at [facility], [address], [place], [place] [address]   Rapid urine drug screen (hospital performed)     Status: Abnormal   Collection Time: 06/08/23  1:27 PM  Result Value Ref Range   Opiates NONE DETECTED NONE DETECTED   Cocaine POSITIVE (A) NONE DETECTED   Benzodiazepines NONE DETECTED NONE DETECTED   Amphetamines NONE DETECTED NONE DETECTED   Tetrahydrocannabinol NONE DETECTED NONE DETECTED   Barbiturates NONE DETECTED NONE DETECTED    Comment: (NOTE) DRUG SCREEN FOR MEDICAL PURPOSES ONLY.  IF CONFIRMATION IS NEEDED FOR ANY PURPOSE, NOTIFY LAB WITHIN 5 DAYS.  LOWEST DETECTABLE LIMITS FOR URINE DRUG SCREEN Drug Class                     Cutoff (ng/mL) Amphetamine and metabolites    1000 Barbiturate and metabolites    200 Benzodiazepine                 200 Opiates and metabolites        300 Cocaine and metabolites        300 THC                            50 Performed at [facility], [address], [place], [place] [address]   Troponin I (High Sensitivity)     Status: None   Collection Time: 06/08/23  3:11 PM  Result Value Ref Range   Troponin I (High Sensitivity) 3 <18 ng/L    Comment:  (NOTE) Elevated high sensitivity troponin I (hsTnI) values and significant  changes across serial measurements may suggest ACS but many other  chronic and acute conditions are known to elevate hsTnI results.  Refer to the "Links" section for chest pain algorithms and additional  guidance. Performed at [facility], [address], [place], [place] [address]     No current facility-administered medications for this encounter.   Current Outpatient Medications  Medication Sig Dispense Refill   APIXABAN (ELIQUIS) VTE STARTER PACK (10MG  AND 5MG ) Take as directed on package: start with two-5mg  tablets twice daily for 7 days. On day 8, switch to one-5mg  tablet twice daily. 74 each 0  metFORMIN (GLUCOPHAGE) 500 MG tablet Take 1 tablet (500 mg total) by mouth 2 (two) times daily with a meal. 60 tablet 0   pantoprazole (PROTONIX) 40 MG tablet Take 40 mg by mouth daily. (Patient not taking: Reported on 02/12/2023)     QUEtiapine (SEROQUEL) 300 MG tablet Take 1 tablet (300 mg total) by mouth 2 (two) times daily. 60 tablet 0   traZODone (DESYREL) 50 MG tablet Take 1 tablet (50 mg total) by mouth at bedtime as needed for sleep. 30 tablet 0    Musculoskeletal:  Observed patient laying in the bed.   Psychiatric Specialty Exam: Presentation  General Appearance:  Appropriate for Environment  Eye Contact: Good  Speech: Clear and Coherent  Speech Volume: Normal  Handedness: Right   Mood and Affect  Mood: Euthymic  Affect: Appropriate   Thought Process  Thought Processes: Coherent  Descriptions of Associations:Intact  Orientation:Full (Time, Place and Person)  Thought Content:WDL  History of Schizophrenia/Schizoaffective disorder:No  Duration of Psychotic Symptoms:N/A  Hallucinations:Hallucinations: None  Ideas of Reference:None  Suicidal Thoughts:Suicidal Thoughts: Yes, Passive SI Passive Intent and/or Plan: Without Plan  Homicidal  Thoughts:Homicidal Thoughts: No   Sensorium  Memory: Immediate Good; Recent Good  Judgment: Fair  Insight: Fair   Chartered certified accountant: Fair  Attention Span: Fair  Recall: [employer] of Knowledge: Fair  Language: Fair   Psychomotor Activity  Psychomotor Activity: Psychomotor Activity: Normal   Assets  Assets: Manufacturing systems engineer; Social Support; Health and safety inspector    Sleep  Sleep: Sleep: Fair   Physical Exam: Physical Exam Vitals and nursing note reviewed. Exam conducted with a chaperone present.  Neurological:     Mental Status: He is alert.  Psychiatric:        Attention and Perception: Attention normal.        Mood and Affect: Mood normal.        Speech: Speech normal.        Behavior: Behavior is cooperative.        Thought Content: Thought content includes suicidal ideation.        Cognition and Memory: Memory normal.        Judgment: Judgment is inappropriate.    Review of Systems  Constitutional: Negative.   Psychiatric/Behavioral:  Positive for depression and substance abuse.    Blood pressure 128/74, pulse 76, temperature 98.2 F (36.8 C), temperature source Oral, resp. rate 16, SpO2 100%. There is no height or weight on file to calculate BMI.   Medical Decision Making: Pt is recommended for overnight observation; require additional collateral needed.      Alona Bene, PMHNP 06/08/2023 7:04 PM

## 2023-06-08 NOTE — ED Notes (Addendum)
Patient dressed out and wanded by security.  

## 2023-06-08 NOTE — ED Notes (Addendum)
Patient had a wheelchair and black back pack located across the lockers in [place]. 1 patient belonging bag.

## 2023-06-08 NOTE — ED Notes (Signed)
Patient transported to x-ray. ?

## 2023-06-09 ENCOUNTER — Encounter (HOSPITAL_COMMUNITY): Payer: Self-pay

## 2023-06-09 DIAGNOSIS — F1994 Other psychoactive substance use, unspecified with psychoactive substance-induced mood disorder: Secondary | ICD-10-CM | POA: Diagnosis not present

## 2023-06-09 MED ORDER — GABAPENTIN 300 MG PO CAPS
300.0000 mg | ORAL_CAPSULE | Freq: Three times a day (TID) | ORAL | Status: DC
  Administered 2023-06-09 – 2023-06-11 (×5): 300 mg via ORAL
  Filled 2023-06-09 (×5): qty 1

## 2023-06-09 MED ORDER — MELATONIN 5 MG PO TABS
5.0000 mg | ORAL_TABLET | Freq: Every day | ORAL | Status: DC
  Administered 2023-06-09 – 2023-06-11 (×3): 5 mg via ORAL
  Filled 2023-06-09 (×3): qty 1

## 2023-06-09 MED ORDER — METFORMIN HCL 500 MG PO TABS
1000.0000 mg | ORAL_TABLET | Freq: Two times a day (BID) | ORAL | Status: DC
  Administered 2023-06-10 – 2023-06-12 (×4): 1000 mg via ORAL
  Filled 2023-06-09 (×4): qty 2

## 2023-06-09 MED ORDER — APIXABAN 5 MG PO TABS
5.0000 mg | ORAL_TABLET | Freq: Two times a day (BID) | ORAL | Status: DC
  Administered 2023-06-09 – 2023-06-12 (×6): 5 mg via ORAL
  Filled 2023-06-09 (×6): qty 1

## 2023-06-09 MED ORDER — DIPHENHYDRAMINE HCL 25 MG PO CAPS
25.0000 mg | ORAL_CAPSULE | Freq: Three times a day (TID) | ORAL | Status: DC | PRN
  Administered 2023-06-09 – 2023-06-11 (×4): 25 mg via ORAL
  Filled 2023-06-09 (×4): qty 1

## 2023-06-09 MED ORDER — SERTRALINE HCL 50 MG PO TABS
25.0000 mg | ORAL_TABLET | Freq: Every day | ORAL | Status: DC
  Administered 2023-06-09 – 2023-06-12 (×3): 25 mg via ORAL
  Filled 2023-06-09 (×3): qty 1

## 2023-06-09 NOTE — Consult Note (Cosign Needed Addendum)
Telepsych Consultation   Reason for Consult:  Suicidal ideation with a plan Referring Physician:  Marita Kansas, PA-C  Location of Patient:    [facility] Location of Provider: Other: virutal home office  Patient Identification: Cory Palmer MRN:  [id number] Principal Diagnosis: Substance induced mood disorder (HCC) Diagnosis:  Principal Problem:   Substance induced mood disorder (HCC) Active Problems:   GAD (generalized anxiety disorder)   Total Time spent with patient: 30 minutes  Subjective:   Cory Palmer is a 62 y.o. male patient admitted with Per RN Triage note dated 06/08/2023@1147am : "Pt presents via EMS from a motel. Pt reports he has been binging on crack cocaine this morning that he smoked around 3 am. Pt unsure whether the drugs were laced with anything else and he has also been drinking. Pt also has been off of his psych meds for some time. Pt wants to be seen today because of the drugs that he used. Pt reports he is suicidal and that he always feel this way. "  HPI:   Cory Palmer, 62 y.o., male patient   Patient seen via telepsych by this provider; chart reviewed and consulted with Dr. Lucianne Muss on 06/09/23.  On evaluation Cory Palmer is seen sitting upright on the hospital gurney.  He's very fidgety and has difficulty sitting still, frequently moving his extremities while talking.  He reports this as baseline for him.  He is fairly groomed, wearing hospital scrubs.  He is alert and oriented x4, very talkative and offers circumstantial information.  He continues to endorse suicidal thoughts with plan and intent for self harm if discharged today.  He reports feeling worthless and tired of his current situation.  He states he's been homeless for the past 20 years, reports it followed him losing his legs.  He states he's tried to go to an assistive living facility but was told that he makes too much.  He reports he smokes crack cocaine, has been doing it for half his life; also  reports hx polysubstance abuse and subsequent rehab.  Last rehab was a few years ago, he does not recall facility name.   He's homeless, states he does not have money, "I'm wiped out" and has not paid for his hotel room this week.  States he's has family who live local but has not been able to get in touch with them.   He reports medication usually helps his suicidal ideations but he stopped months ago, cites problems with getting his prescriptions.  We reviewed his meds today and he requests to restart them.  His EKG shows afib and prolonged QT intervals so informed quetiapine will have to be held.  His apixaban has already been restarted.   During evaluation Cory Palmer is observed sitting on hospital gurney, hob elevated. He is alert/oriented x 4; Stated mood is anxious,depressed, worthless and hopeless but cooperative; and mood congruent with affect.  Patient is very talkative and offers circumstantial details. His makes fair eye contact.  His thought process is linear; There is no indication that he is currently responding to internal/external stimuli or experiencing delusional thought content.  Patient continues to endorse suicidal thoughts with plan to use a firearm.  He denies homicidal ideation, psychosis, and paranoia.  Patient has remained cooperative throughout assessment and has answered questions appropriately.   Per ED Provider Admission Assessment 06/08/2023:  Chief Complaint  Patient presents with   Drug Problem   Suicidal  Cory Palmer is a 62 y.o. male.   62 year old male presents today for concern of suicidal ideation.  States he has access to firearms.  Endorses binging on cocaine recently.  Endorses some shortness of breath.  No chest pain.  No other complaints.  He states the outpatient resources or trying to establish care with a home however he owes some money to [facility] energy and has to pay by next week.  Would like to speak with behavioral health.   The history is  provided by the patient. No language interpreter was used.  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    Past Surgical History:  Procedure Laterality Date   AMPUTATION Left 09/28/2018   Procedure: LEFT TRANSMETATARSAL AMPUTATION;  Surgeon: Nadara Mustard, MD;  Location: [facility] OR;  Service: Orthopedics;  Laterality: Left;   AMPUTATION Left 10/23/2019   Procedure: AMPUTATION BELOW KNEE;  Surgeon: Nadara Mustard, MD;  Location: [facility] OR;  Service: Orthopedics;  Laterality: Left;   BELOW KNEE LEG AMPUTATION Right    ESOPHAGOGASTRODUODENOSCOPY (EGD) WITH PROPOFOL N/A 10/19/2020   Procedure: ESOPHAGOGASTRODUODENOSCOPY (EGD) WITH PROPOFOL;  Surgeon: Charna Elizabeth, MD;  Location: [facility] ENDOSCOPY;  Service: Endoscopy;  Laterality: N/A;   TEE WITHOUT CARDIOVERSION N/A 10/25/2019   Procedure: TRANSESOPHAGEAL ECHOCARDIOGRAM (TEE);  Surgeon: Lewayne Bunting, MD;  Location: [facility] ENDOSCOPY;  Service: Cardiovascular;  Laterality: N/A;   Family History:  Family History  Problem Relation Age of Onset   Hypertension Other    Diabetes Mellitus II Other    Family Psychiatric  History: deferred Social History:  Social History   Substance and Sexual Activity  Alcohol Use Yes     Social History   Substance and Sexual Activity  Drug Use Not Currently   Types: Cocaine   Comment: Last used in July 2023    Social History   Socioeconomic History   Marital status: Single    Spouse name: Not on file    Number of children: Not on file   Years of education: Not on file   Highest education level: Not on file  Occupational History   Not on file  Tobacco Use   Smoking status: Every Day    Current packs/day: 0.25    Types: Cigarettes    Passive exposure: Current   Smokeless tobacco: Never  Vaping Use   Vaping status: Never Used  Substance and Sexual Activity   Alcohol use: Yes   Drug use: Not Currently    Types: Cocaine    Comment: Last used in July 2023   Sexual activity: Not Currently  Other Topics Concern   Not on file  Social History Narrative   Not on file   Social Determinants of Health   Financial Resource Strain: Not on File (11/18/2021)   Received from [employer]    Financial Resource Strain: 0  Food Insecurity: Not on File (04/27/2023)  Received from [employer]    Food: 0  Transportation Needs: Unmet Transportation Needs (12/28/2022)   PRAPARE - Administrator, Civil Service (Medical): Yes    Lack of Transportation (Non-Medical): Yes  Physical Activity: Not on File (11/18/2021)   Received from [place], [place]   Physical Activity    Physical Activity: 0  Stress: Not on File (11/18/2021)   Received from [facility], [place]   Stress    Stress: 0  Social Connections: Not on File (04/15/2023)   Received from [employer]   Social Connections    Connectedness: 0   Additional Social History:    Allergies:   Allergies  Allergen Reactions   Atorvastatin Calcium Nausea And Vomiting   Gemfibrozil     Unknown   Insulin Glargine     Unknown    Labs:  Results for orders placed or performed during the hospital encounter of 06/08/23 (from the past 48 hour(s))  Acetaminophen level     Status: Abnormal   Collection Time: 06/08/23  1:00 PM  Result Value Ref Range   Acetaminophen (Tylenol), Serum <10 (L) 10 - 30 ug/mL    Comment: (NOTE) Therapeutic concentrations vary significantly. A range of 10-30 ug/mL  may be an effective  concentration for many patients. However, some  are best treated at concentrations outside of this range. Acetaminophen concentrations >150 ug/mL at 4 hours after ingestion  and >50 ug/mL at 12 hours after ingestion are often associated with  toxic reactions.  Performed at [facility], [address], [place], [place] [address]   CBC with Differential/Platelet     Status: Abnormal   Collection Time: 06/08/23  1:00 PM  Result Value Ref Range   WBC 9.0 4.0 - 10.5 K/uL   RBC 4.10 (L) 4.22 - 5.81 MIL/uL   Hemoglobin 13.8 13.0 - 17.0 g/dL   HCT 19.1 47.8 - 29.5 %   MCV 97.1 80.0 - 100.0 fL   MCH 33.7 26.0 - 34.0 pg   MCHC 34.7 30.0 - 36.0 g/dL   RDW 62.1 30.8 - 65.7 %   Platelets 156 150 - 400 K/uL   nRBC 0.0 0.0 - 0.2 %   Neutrophils Relative % 75 %   Neutro Abs 6.8 1.7 - 7.7 K/uL   Lymphocytes Relative 15 %   Lymphs Abs 1.4 0.7 - 4.0 K/uL   Monocytes Relative 5 %   Monocytes Absolute 0.5 0.1 - 1.0 K/uL   Eosinophils Relative 3 %   Eosinophils Absolute 0.2 0.0 - 0.5 K/uL   Basophils Relative 1 %   Basophils Absolute 0.1 0.0 - 0.1 K/uL   Immature Granulocytes 1 %   Abs Immature Granulocytes 0.05 0.00 - 0.07 K/uL    Comment: Performed at [facility], [address], [place], [place] [address]  Comprehensive metabolic panel     Status: Abnormal   Collection Time: 06/08/23  1:00 PM  Result Value Ref Range   Sodium 138 135 - 145 mmol/L   Potassium 3.6 3.5 - 5.1 mmol/L   Chloride 106 98 - 111 mmol/L   CO2 21 (L) 22 - 32 mmol/L   Glucose, Bld 79 70 - 99 mg/dL    Comment: Glucose reference range applies only to samples taken after fasting for at least 8 hours.   BUN 10 8 - 23 mg/dL   Creatinine, Ser 2.95 0.61 - 1.24 mg/dL   Calcium 8.7 (L) 8.9 - 10.3 mg/dL   Total  Protein 7.1 6.5 - 8.1 g/dL   Albumin 3.7 3.5 - 5.0 g/dL   AST 41 15 - 41 U/L   ALT 20 0 - 44 U/L   Alkaline Phosphatase 113 38 - 126 U/L   Total Bilirubin 2.5 (H) <1.2 mg/dL    GFR, Estimated >81 >19 mL/min    Comment: (NOTE) Calculated using the CKD-EPI Creatinine Equation (2021)    Anion gap 11 5 - 15    Comment: Performed at [facility], [address] [place], [place] [address]  Ethanol     Status: None   Collection Time: 06/08/23  1:00 PM  Result Value Ref Range   Alcohol, Ethyl (B) <10 <10 mg/dL    Comment: (NOTE) Lowest detectable limit for serum alcohol is 10 mg/dL.  For medical purposes only. Performed at [facility], [address], [place], [place] [address]   Salicylate level     Status: Abnormal   Collection Time: 06/08/23  1:00 PM  Result Value Ref Range   Salicylate Lvl <7.0 (L) 7.0 - 30.0 mg/dL    Comment: Performed at [facility], [address] [place], [place] [address]  Magnesium     Status: None   Collection Time: 06/08/23  1:00 PM  Result Value Ref Range   Magnesium 1.7 1.7 - 2.4 mg/dL    Comment: Performed at [facility], [address], [place], [place] [address]  Troponin I (High Sensitivity)     Status: None   Collection Time: 06/08/23  1:00 PM  Result Value Ref Range   Troponin I (High Sensitivity) 3 <18 ng/L    Comment: (NOTE) Elevated high sensitivity troponin I (hsTnI) values and significant  changes across serial measurements may suggest ACS but many other  chronic and acute conditions are known to elevate hsTnI results.  Refer to the "Links" section for chest pain algorithms and additional  guidance. Performed at [facility], [address], [place], [place] [address]   Rapid urine drug screen (hospital performed)     Status: Abnormal   Collection Time: 06/08/23  1:27 PM  Result Value Ref Range   Opiates NONE DETECTED NONE DETECTED   Cocaine POSITIVE (A) NONE DETECTED   Benzodiazepines NONE DETECTED NONE DETECTED   Amphetamines NONE DETECTED NONE DETECTED   Tetrahydrocannabinol NONE DETECTED NONE DETECTED    Barbiturates NONE DETECTED NONE DETECTED    Comment: (NOTE) DRUG SCREEN FOR MEDICAL PURPOSES ONLY.  IF CONFIRMATION IS NEEDED FOR ANY PURPOSE, NOTIFY LAB WITHIN 5 DAYS.  LOWEST DETECTABLE LIMITS FOR URINE DRUG SCREEN Drug Class                     Cutoff (ng/mL) Amphetamine and metabolites    1000 Barbiturate and metabolites    200 Benzodiazepine                 200 Opiates and metabolites        300 Cocaine and metabolites        300 THC                            50 Performed at [facility], [address], [place], [place] [address]   Troponin I (High Sensitivity)     Status: None   Collection Time: 06/08/23  3:11 PM  Result Value Ref Range   Troponin I (High Sensitivity) 3 <  18 ng/L    Comment: (NOTE) Elevated high sensitivity troponin I (hsTnI) values and significant  changes across serial measurements may suggest ACS but many other  chronic and acute conditions are known to elevate hsTnI results.  Refer to the "Links" section for chest pain algorithms and additional  guidance. Performed at [facility], [address], [place], [place] [address]     Medications:  No current facility-administered medications for this encounter.   Current Outpatient Medications  Medication Sig Dispense Refill   apixaban (ELIQUIS) 5 MG TABS tablet Take 5 mg by mouth 2 (two) times daily.     APIXABAN (ELIQUIS) VTE STARTER PACK (10MG  AND 5MG ) Take as directed on package: start with two-5mg  tablets twice daily for 7 days. On day 8, switch to one-5mg  tablet twice daily. (Patient not taking: Reported on 06/09/2023) 74 each 0   gabapentin (NEURONTIN) 300 MG capsule Take 300 mg by mouth 3 (three) times daily.     metFORMIN (GLUCOPHAGE) 1000 MG tablet Take 1,000 mg by mouth 2 (two) times daily with a meal.     metFORMIN (GLUCOPHAGE) 500 MG tablet Take 1 tablet (500 mg total) by mouth 2 (two) times daily with a meal. (Patient not taking: Reported on 06/09/2023)  60 tablet 0   pantoprazole (PROTONIX) 40 MG tablet Take 40 mg by mouth daily.     QUEtiapine (SEROQUEL) 300 MG tablet Take 1 tablet (300 mg total) by mouth 2 (two) times daily. 60 tablet 0   traZODone (DESYREL) 50 MG tablet Take 1 tablet (50 mg total) by mouth at bedtime as needed for sleep. 30 tablet 0    Musculoskeletal: pt moves upper extremities without impairments noted; he has bilateral below the knee amputations.  Strength & Muscle Tone: spastic and as outlined above Gait & Station:  as outlined above Patient leans: N/A      Psychiatric Specialty Exam:  Presentation  General Appearance:  Appropriate for Environment  Eye Contact: Good  Speech: Clear and Coherent  Speech Volume: Normal  Handedness: Right   Mood and Affect  Mood: Euthymic  Affect: Appropriate   Thought Process  Thought Processes: Coherent  Descriptions of Associations:Intact  Orientation:Full (Time, Place and Person)  Thought Content:WDL  History of Schizophrenia/Schizoaffective disorder:No  Duration of Psychotic Symptoms:N/A  Hallucinations:Hallucinations: None  Ideas of Reference:None  Suicidal Thoughts:Suicidal Thoughts: Yes, Passive SI Passive Intent and/or Plan: Without Plan  Homicidal Thoughts:Homicidal Thoughts: No   Sensorium  Memory: Immediate Good; Recent Good  Judgment: Fair  Insight: Fair   Chartered certified accountant: Fair  Attention Span: Fair  Recall: [employer] of Knowledge: Fair  Language: Fair   Psychomotor Activity  Psychomotor Activity: Psychomotor Activity: Normal   Assets  Assets: Manufacturing systems engineer; Social Support; Health and safety inspector   Sleep  Sleep: Sleep: Fair    Physical Exam: Physical Exam Constitutional:      Appearance: Normal appearance.  Cardiovascular:     Rate and Rhythm: Normal rate.     Pulses: Normal pulses.  Pulmonary:     Effort: Pulmonary effort is normal.   Musculoskeletal:        General: Normal range of motion.     Cervical back: Normal range of motion.  Neurological:     Mental Status: He is alert and oriented to person, place, and time. Mental status is at baseline.  Psychiatric:        Attention and Perception: Attention and perception normal.  Mood and Affect: Mood is anxious and depressed. Affect is blunt.        Speech: Speech is rapid and pressured.        Behavior: Behavior is hyperactive. Behavior is cooperative.        Thought Content: Thought content is not paranoid or delusional. Thought content includes suicidal ideation. Thought content does not include homicidal ideation. Thought content includes suicidal plan. Thought content does not include homicidal plan.        Cognition and Memory: Cognition and memory normal.        Judgment: Judgment is impulsive.    Review of Systems  Constitutional: Negative.   HENT: Negative.    Eyes: Negative.   Respiratory: Negative.    Cardiovascular: Negative.   Gastrointestinal: Negative.   Genitourinary: Negative.   Skin: Negative.   Neurological: Negative.   Endo/Heme/Allergies: Negative.   Psychiatric/Behavioral:  Positive for depression, substance abuse and suicidal ideas. The patient is nervous/anxious.    Blood pressure 112/73, pulse 81, temperature 98 F (36.7 C), temperature source Oral, resp. rate 18, SpO2 100%. There is no height or weight on file to calculate BMI.  Treatment Plan Summary: Patient with polysubstance abuse, continues to endorse suicidal thoughts with plan and intent for self harm should he be discharged home today.  He's triggered by psychosocial stressors and homelessness. On assessment today, he is alert and oriented x4, but presents hyperactive and very talkative.  Attempted to explore supports that could be put in place to improve his stressors but he will not contract for safety and endorses feelings of worthlessness and hopelessness.  Considering  this, He's is aware of the plan for admissions and agrees with this. Pt has bilateral below the knee amputations so will need a facility that can accommodate his needs.  Restarted home meds but will hold quetiapine d/t prolonged QT intervals.   Medications: Gabapentin 300mg  po TID for pain/anxiety Melatonin 5mg  po at bedtime for sleep Sertraline 25mg  po daily or depression/anxiety; low dose to promote tolerability with plans to optimize.  Daily contact with patient to assess and evaluate symptoms and progress in treatment and Medication management  Disposition: Recommend psychiatric Inpatient admission when medically cleared.  This service was provided via telemedicine using a 2-way, interactive audio and video technology.  Names of all persons participating in this telemedicine service and their role in this encounter. Name: Masyn Zani Role: Patient   Name: Ophelia Shoulder Role: PMHNP  Name: Nelly Rout Role: Psychiatrist    Chales Abrahams, NP 06/09/2023 8:30 AM

## 2023-06-10 DIAGNOSIS — F1994 Other psychoactive substance use, unspecified with psychoactive substance-induced mood disorder: Secondary | ICD-10-CM | POA: Diagnosis not present

## 2023-06-10 MED ORDER — PROCHLORPERAZINE MALEATE 10 MG PO TABS
5.0000 mg | ORAL_TABLET | Freq: Once | ORAL | Status: AC
  Administered 2023-06-10: 5 mg via ORAL
  Filled 2023-06-10: qty 1

## 2023-06-10 NOTE — Progress Notes (Signed)
Per Ophelia Shoulder, NP pt meets inpatient [facility] placement. CSW has request [facility] to review. 1st shift CSW to follow up.  Maryjean Ka, MSW, LCSWA 06/10/2023 2:08 AM
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
## 2023-06-10 NOTE — ED Provider Notes (Signed)
Emergency Medicine Observation Re-evaluation Note  Cory Palmer is a 62 y.o. male, seen on rounds today.  Pt initially presented to the ED for complaints of Drug Problem and Suicidal Currently, the patient is not having any acute complaints.  Physical Exam  BP (!) 117/58 (BP Location: Left Arm)   Pulse 85   Temp 99.1 F (37.3 C) (Oral)   Resp 17   SpO2 99%  Physical Exam General: Resting comfortably in stretcher Lungs: Normal work of breathing Psych: Calm  ED Course / MDM  EKG:EKG Interpretation Date/Time:  Thursday June 08 2023 14:43:05 EST Ventricular Rate:  73 PR Interval:    QRS Duration:  106 QT Interval:  472 QTC Calculation: 519 R Axis:   29  Text Interpretation: Atrial flutter Septal infarct , age undetermined Prolonged QT Abnormal ECG Confirmed by Tilden Fossa [phone]) on 06/09/2023 3:32:54 PM  I have reviewed the labs performed to date as well as medications administered while in observation.  Recent changes in the last 24 hours include seen by psychiatry.  Plan  Current plan is for inpatient psychiatry placement.  Holding Seroquel and trazodone due to prolonged QT.    Rondel Baton, MD 06/10/23 [phone]

## 2023-06-11 DIAGNOSIS — F1994 Other psychoactive substance use, unspecified with psychoactive substance-induced mood disorder: Secondary | ICD-10-CM

## 2023-06-11 NOTE — ED Provider Notes (Signed)
Emergency Medicine Observation Re-evaluation Note  Cory Palmer is a 62 y.o. male, seen on rounds today.  Pt initially presented to the ED for complaints of Drug Problem and Suicidal Currently, the patient is resting.  Physical Exam  BP (!) 114/59 (BP Location: Left Arm)   Pulse 67   Temp 97.9 F (36.6 C) (Oral)   Resp 12   SpO2 97%  Physical Exam Neurological:     Mental Status: He is alert.      ED Course / MDM  EKG:EKG Interpretation Date/Time:  Thursday June 08 2023 14:43:05 EST Ventricular Rate:  73 PR Interval:    QRS Duration:  106 QT Interval:  472 QTC Calculation: 519 R Axis:   29  Text Interpretation: Atrial flutter Septal infarct , age undetermined Prolonged QT Abnormal ECG Confirmed by Tilden Fossa [phone]) on 06/09/2023 3:32:54 PM  I have reviewed the labs performed to date as well as medications administered while in observation.  Recent changes in the last 24 hours include nothing.  Plan  Current plan is for psych placement, check EKG before using qtc drugs.    Virgina Norfolk, DO 06/11/23 7425

## 2023-06-11 NOTE — Progress Notes (Signed)
Patient has been denied by [facility] due to no appropriate beds available. Patient meets BH inpatient criteria per Dahlia Byes, NP. Patient has been faxed out to the following facilities:   [facility]  [address] [place] [address] [phone]  [facility]  [address] [place] [address] [phone]  [facility] [address]  [address], [place] [address] [phone]  [facility] Patient Placement  [facility] [place], [place] [phone]  [facility]  [address] [place] [address] [phone]  [facility]  [address] [place] [address] [phone]  [facility]  [address] [place], [place] [address] [phone]  [facility]  [address] [place] [address] [phone]  [facility]  [address], [place] [address] [phone]  [facility]  [address] [place], [place] [phone]  [facility]  [address] [place] [address] [phone]  [facility]  [address], [place] [address] [phone]  [facility]  [address] [place], [place] [address] [phone]  [facility]  [address] [place] [address] [phone]  [facility]  [address] [place] [address] [phone]  [facility]  [address], [place] [address] [phone]   Damita Dunnings, MSW, LCSW-A  11:31 AM  06/11/2023

## 2023-06-11 NOTE — Progress Notes (Cosign Needed Addendum)
[facility] Psych ED Progress Note  06/11/2023 7:30 PM Cory Palmer  MRN:  [id number]   Subjective:  This patient was brought in by Ambulance from a [employer] where he was binging on Crack Cocaine yesterday morning.  Patient has previous hx of Polysubstance abuse, homelessness, Alcohol use disorder, GAD , Bipolar 1 disorder, Depression and substance induced mood disorder.  Patient remains suicidal and is being faxed out for inpatient Psychiatry hospitalization.  Patient is being faxed for bed as [facility] is filled up.  We will repeat EKG on Thursday to evaluate QTC interval.  Last EKG was done November 7 th.  Patient denies HI/AVH. Principal Problem: Substance induced mood disorder (HCC) Diagnosis:  Principal Problem:   Substance induced mood disorder (HCC) Active Problems:   GAD (generalized anxiety disorder)   ED Assessment Time Calculation: Start Time: 1912 Stop Time: 1928 Total Time in Minutes (Assessment Completion): 16   Past Psychiatric History: See initial Psychiatry evaluation note  [place] Scale:  Flowsheet Row ED from 06/08/2023 in [facility] Emergency Department at [facility] ED from 02/12/2023 in [facility] Emergency Department at  [facility] ED from 01/16/2023 in [facility] Emergency Department at [facility]  C-SSRS RISK CATEGORY High Risk High Risk No Risk       Past Medical 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    Past Surgical History:  Procedure Laterality Date   AMPUTATION Left 09/28/2018   Procedure: LEFT TRANSMETATARSAL AMPUTATION;  Surgeon: Nadara Mustard, MD;  Location: [facility] OR;  Service: Orthopedics;   Laterality: Left;   AMPUTATION Left 10/23/2019   Procedure: AMPUTATION BELOW KNEE;  Surgeon: Nadara Mustard, MD;  Location: [facility] OR;  Service: Orthopedics;  Laterality: Left;   BELOW KNEE LEG AMPUTATION Right    ESOPHAGOGASTRODUODENOSCOPY (EGD) WITH PROPOFOL N/A 10/19/2020   Procedure: ESOPHAGOGASTRODUODENOSCOPY (EGD) WITH PROPOFOL;  Surgeon: Charna Elizabeth, MD;  Location: [facility] ENDOSCOPY;  Service: Endoscopy;  Laterality: N/A;   TEE WITHOUT CARDIOVERSION N/A 10/25/2019   Procedure: TRANSESOPHAGEAL ECHOCARDIOGRAM (TEE);  Surgeon: Lewayne Bunting, MD;  Location: [facility] ENDOSCOPY;  Service: Cardiovascular;  Laterality: N/A;   Family History:  Family History  Problem Relation Age of Onset   Hypertension Other    Diabetes Mellitus II Other    Family Psychiatric  History: See initial Psychiatry evaluation note Social History:  Social History   Substance and Sexual Activity  Alcohol Use Yes     Social History   Substance and Sexual Activity  Drug Use Not Currently   Types: Cocaine   Comment: Last used in July 2023    Social History   Socioeconomic History   Marital status: Single    Spouse name: Not on file   Number of children: Not on file   Years of education: Not on file   Highest education level: Not on file  Occupational History   Not on file  Tobacco Use   Smoking status: Every Day    Current packs/day: 0.25    Types: Cigarettes    Passive exposure: Current   Smokeless tobacco: Never  Vaping Use  Vaping status: Never Used  Substance and Sexual Activity   Alcohol use: Yes   Drug use: Not Currently    Types: Cocaine    Comment: Last used in July 2023   Sexual activity: Not Currently  Other Topics Concern   Not on file  Social History Narrative   Not on file   Social Determinants of Health   Financial Resource Strain: Not on File (11/18/2021)   [facility] [employer] [employer]   [employer]    Financial Resource Strain: 0  Food Insecurity: Not on File  (04/27/2023)   Received from [employer] Insecurity    Food: 0  Transportation Needs: Unmet Transportation Needs (12/28/2022)   PRAPARE - Administrator, Civil Service (Medical): Yes    Lack of Transportation (Non-Medical): Yes  Physical Activity: Not on File (11/18/2021)   Received from [place], [place]   Physical Activity    Physical Activity: 0  Stress: Not on File (11/18/2021)   Received from [facility], [place]   Stress    Stress: 0  Social Connections: Not on File (04/15/2023)   Received from [employer]   Social Connections    Connectedness: 0    Sleep: Fair  Appetite:  Fair  Current Medications: Current Facility-Administered Medications  Medication Dose Route Frequency Provider Last Rate Last Admin   apixaban (ELIQUIS) tablet 5 mg  5 mg Oral BID Linwood Dibbles, MD   5 mg at 06/11/23 1006   diphenhydrAMINE (BENADRYL) capsule 25 mg  25 mg Oral Q8H PRN Linwood Dibbles, MD   25 mg at 06/11/23 1600   gabapentin (NEURONTIN) capsule 300 mg  300 mg Oral TID Ophelia Shoulder E, NP   300 mg at 06/11/23 1600   melatonin tablet 5 mg  5 mg Oral QHS Ophelia Shoulder E, NP   5 mg at 06/10/23 2117   metFORMIN (GLUCOPHAGE) tablet 1,000 mg  1,000 mg Oral BID WC Ophelia Shoulder E, NP   1,000 mg at 06/11/23 1600   sertraline (ZOLOFT) tablet 25 mg  25 mg Oral Daily Ophelia Shoulder E, NP   25 mg at 06/10/23 5409   Current Outpatient Medications  Medication Sig Dispense Refill   apixaban (ELIQUIS) 5 MG TABS tablet Take 5 mg by mouth 2 (two) times daily.     APIXABAN (ELIQUIS) VTE STARTER PACK (10MG  AND 5MG ) Take as directed on package: start with two-5mg  tablets twice daily for 7 days. On day 8, switch to one-5mg  tablet twice daily. (Patient not taking: Reported on 06/09/2023) 74 each 0   gabapentin (NEURONTIN) 300 MG capsule Take 300 mg by mouth 3 (three) times daily.     metFORMIN (GLUCOPHAGE) 1000 MG tablet Take 1,000 mg by mouth 2 (two) times daily with a meal.     metFORMIN (GLUCOPHAGE) 500 MG tablet Take 1 tablet  (500 mg total) by mouth 2 (two) times daily with a meal. (Patient not taking: Reported on 06/09/2023) 60 tablet 0   pantoprazole (PROTONIX) 40 MG tablet Take 40 mg by mouth daily.     QUEtiapine (SEROQUEL) 300 MG tablet Take 1 tablet (300 mg total) by mouth 2 (two) times daily. 60 tablet 0   traZODone (DESYREL) 50 MG tablet Take 1 tablet (50 mg total) by mouth at bedtime as needed for sleep. 30 tablet 0    Lab Results: No results found for this or any previous visit (from the past 48 hour(s)).  Blood Alcohol level:  Lab Results  Component Value Date  ETH <10 06/08/2023   ETH 37 (H) 02/13/2023    Physical Findings:  CIWA:    COWS:     Musculoskeletal: Strength & Muscle Tone:  Bilateral ambulation, utilizes w/c Gait & Station:  see above Patient leans:  see above  Psychiatric Specialty Exam:  Presentation  General Appearance:  Appropriate for Environment; Fairly Groomed  Eye Contact: Fair  Speech: Pressured  Speech Volume: Normal  Handedness: Right   Mood and Affect  Mood: Anxious; Depressed; Hopeless; Worthless  Affect: Congruent; Depressed   Thought Process  Thought Processes: Coherent  Descriptions of Associations:Circumstantial  Orientation:Full (Time, Place and Person)  Thought Content:Illogical  History of Schizophrenia/Schizoaffective disorder:No  Duration of Psychotic Symptoms:N/A  Hallucinations:Hallucinations: None  Ideas of Reference:None  Suicidal Thoughts:Suicidal Thoughts: Yes, Active SI Active Intent and/or Plan: With Intent; With Plan; With Means to Carry Out; With Access to Means  Homicidal Thoughts:Homicidal Thoughts: No   Sensorium  Memory: Immediate Fair; Recent Fair; Remote Fair  Judgment: Impaired  Insight: Lacking   Executive Functions  Concentration: Fair  Attention Span: Fair  Recall: [employer] of Knowledge: Fair  Language: Good   Psychomotor Activity  Psychomotor Activity: Psychomotor  Activity: Increased   Assets  Assets: Communication Skills; Desire for Improvement; Financial Resources/Insurance   Sleep  Sleep: Sleep: Poor    Physical Exam: Physical Exam Vitals and nursing note reviewed.  Constitutional:      Appearance: He is obese.  HENT:     Nose: Nose normal.  Cardiovascular:     Rate and Rhythm: Normal rate and regular rhythm.  Pulmonary:     Effort: Pulmonary effort is normal.  Musculoskeletal:     Comments: Bilateral amputation, utilizes w/c.  Skin:    General: Skin is dry.  Neurological:     Mental Status: He is alert and oriented to person, place, and time.  Psychiatric:        Attention and Perception: Attention normal.        Mood and Affect: Mood is anxious and depressed.        Speech: Speech normal.        Behavior: Behavior normal. Behavior is cooperative.        Thought Content: Thought content includes suicidal ideation. Thought content includes suicidal plan.        Cognition and Memory: Cognition and memory normal.        Judgment: Judgment is impulsive.    Review of Systems  Constitutional: Negative.   HENT: Negative.    Eyes: Negative.   Respiratory: Negative.    Cardiovascular: Negative.   Gastrointestinal: Negative.   Genitourinary: Negative.   Musculoskeletal:        Bilateral Amputation  Skin: Negative.   Neurological: Negative.   Endo/Heme/Allergies: Negative.   Psychiatric/Behavioral:  Positive for depression, substance abuse and suicidal ideas. The patient is nervous/anxious.    Blood pressure 118/65, pulse 69, temperature 97.8 F (36.6 C), temperature source Oral, resp. rate 16, SpO2 99%. There is no height or weight on file to calculate BMI.   Medical Decision Making: We will fax out records for inpatient Psychiatry hospitalization.  Patient is a danger to self as he feels hopeless and helpless.  Due to elevated QTC interval his Seroquel and Trazodone will be held until QTC interval is normalized.  We will  obtain EKG on Thursday 06/15/2023 to evaluate QTC Interval.  Earney Navy, NP-PMHNP-BC 06/11/2023, 7:30 PM

## 2023-06-12 DIAGNOSIS — F1994 Other psychoactive substance use, unspecified with psychoactive substance-induced mood disorder: Secondary | ICD-10-CM

## 2023-06-12 MED ORDER — GABAPENTIN 300 MG PO CAPS
300.0000 mg | ORAL_CAPSULE | Freq: Three times a day (TID) | ORAL | Status: DC
  Administered 2023-06-12: 300 mg via ORAL
  Filled 2023-06-12: qty 1

## 2023-06-12 NOTE — ED Notes (Signed)
Pt d/c with instructions. All questions answered. All belongings return by [facility]. Pt left via wheelchair

## 2023-06-12 NOTE — Discharge Instructions (Addendum)
Thank you for coming to [facility] Emergency Department. You were seen for thoughts of harming yourself and drug abuse. Please follow up with an outpatient psychiatric provider. Attached are resources to help with housing and financial assistance.  Please follow up with your primary care provider within 1 week.   Do not hesitate to return to the ED or call 911 if you experience: -Worsening symptoms -Thoughts of harming yourself or others -Seeing or hearing things other people do not -Lightheadedness, passing out -Fevers/chills -Anything else that concerns you

## 2023-06-12 NOTE — ED Provider Notes (Addendum)
Emergency Medicine Observation Re-evaluation Note  Cory Palmer is a 62 y.o. male, seen on rounds today.  Pt initially presented to the ED for complaints of Drug Problem and Suicidal Currently, the patient is sleeping.  Physical Exam  BP 124/74 (BP Location: Left Arm)   Pulse 73   Temp 97.8 F (36.6 C) (Oral)   Resp 18   SpO2 97%  Physical Exam General: calm, sleeping Cardiac: well-perfused Lungs: no resp distress Ext: S/p BL BKA Psych: sleeping  ED Course / MDM  EKG:EKG Interpretation Date/Time:  Thursday June 08 2023 14:43:05 EST Ventricular Rate:  73 PR Interval:    QRS Duration:  106 QT Interval:  472 QTC Calculation: 519 R Axis:   29  Text Interpretation: Atrial flutter Septal infarct , age undetermined Prolonged QT Abnormal ECG Confirmed by Tilden Fossa [phone]) on 06/09/2023 3:32:54 PM  I have reviewed the labs performed to date as well as medications administered while in observation.  Recent changes in the last 24 hours include none.  Plan  Current plan is for placement.  Clinical Course as of 06/12/23 1133  Mon Jun 12, 2023  1110 Notified by psychiatry that patient is psychiatrically cleared for discharge. DC w/ discharge instructions/return precautions. All questions answered to patient's satisfaction.   [HN]    Clinical Course User Index [HN] Loetta Rough, MD      Loetta Rough, MD 06/12/23 6045    Loetta Rough, MD 06/12/23 [phone]

## 2023-06-12 NOTE — Discharge Summary (Signed)
[facility] Psych ED Discharge  06/12/2023 11:04 AM Cory Palmer  MRN:  [id number]  Principal Problem: Substance induced mood disorder [facility]) Discharge Diagnoses: Principal Problem:   Substance induced mood disorder (HCC) Active Problems:   GAD (generalized anxiety disorder)  Clinical Impression:  Final diagnoses:  Cocaine abuse (HCC)  Suicidal ideation   Subjective:  Patient seen at [facility] for face to face psychiatric reevaluation. Patient has been followed by psychiatry for numerous days, as he continued to endorse SI/HI, housing instability, and drug use.   We discussed his current psychosocial stressors. 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 Pt states he has been working with Karoline Caldwell at [facility] and actually should be able to move into a low income housing apartment starting tomorrow, however he has to pay $165 to [employer] [facility] [facility] [employer] energy prior to being able to move in. Pt stated he does not have that kind of money and needs the hospital to help him. I asked the patient about his disability check, and he gets roughly $1565 per month. Pt stated he is already out of a check. I informed the patient it was only November 11th, and how did his check run out so quickly? Pt stated he spent it on motels, taxis, and drugs. Pt stated he is unable to pay the [employer] energy bill and he needs assistance.   Pt has no psychiatric complaints today. He denies SI/HI/AVH. Pt stated "I was feeling great until someone said I was being kicked out of the ED today." I informed patient he is not meeting inpatient psychiatric criteria, and his current concerns of housing and financial assistance can be handled outside of the hospital with social services. Explained he needs to go to social services building, and request help from a care coordinator. Pt states he has also been in touch with [facility] today for assistance as well. Pt was accepting of news to discharge, he is hopefully he can find someone to pay  his utility bill so he can move into this apartment tomorrow. Will provide him with numerous resources in AVS to utilize.    Patient has been observed for days while in the ED and there has been no unsafe behavior observed.  Patients' suicidal ideation is conditional and used as a means of manipulation and/or attention seeking without true intention to harm himself.  His behaviors are more consistent with someone who is acting in self-preservation, rather that someone who is acutely suicidal.  Given all that is stated above including the fact that the patient shows no signs of mania or psychosis, has a liner, coherent thought process throughout assessment the patient does not meet criteria for inpatient psychiatric hospitalization or further psychiatric evaluation at this time.  Patient would benefit from outpatient psychiatric services which resources will be provided along with community resources prior to discharge.    Patient ongoing endorsement of suicidal ideation shows clear evidence of secondary gain of unmet needs that is representative of limited and often- maladaptive coping skills and rather than an indicator of imminent risk of death.  Evidence indicates that subsequent suicide attempts by patients who made contingent suicide threats (defined as threatened suicide or exaggerated suicidality) are uncommon in both groups.  Hospitalization should not be used as a substitute for social services, substance abuse treatment, and legal assistance for patients who make contingent suicide threats (Characteristics and six-month outcome of patients who use suicide threats to seek hospital admission.  (1966). Psychiatric Services, 47(8), [phone]. (  DOI: 10.1176/ps.47.8.871).     Psychiatric hospitalization is not indicated, and the patient's refusal of interventions that have been offered by clinical care teams during prior stays in the ED, UC and during psychiatric hospitalization to mitigate risk of  self-harm, other than allowing care team to observe to sobriety or behavior.  This provider and Dr. Lucianne Muss have discussed assessment and treatment recommendations with patient.  Further we see no evidence of severe psychosis, cognitive impairment, intoxication, or other condition that prevents the patient from acting under their own choice and volition.      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    Past Surgical History:  Procedure Laterality Date   AMPUTATION Left 09/28/2018   Procedure: LEFT TRANSMETATARSAL AMPUTATION;  Surgeon: Nadara Mustard, MD;  Location: [facility] OR;  Service: Orthopedics;  Laterality: Left;   AMPUTATION Left 10/23/2019   Procedure: AMPUTATION BELOW KNEE;  Surgeon: Nadara Mustard, MD;  Location: [facility] OR;  Service: Orthopedics;  Laterality: Left;   BELOW KNEE LEG AMPUTATION Right    ESOPHAGOGASTRODUODENOSCOPY (EGD) WITH PROPOFOL N/A 10/19/2020   Procedure: ESOPHAGOGASTRODUODENOSCOPY (EGD) WITH PROPOFOL;  Surgeon: Charna Elizabeth, MD;  Location: [facility] ENDOSCOPY;  Service: Endoscopy;  Laterality: N/A;   TEE WITHOUT CARDIOVERSION N/A 10/25/2019   Procedure: TRANSESOPHAGEAL ECHOCARDIOGRAM (TEE);  Surgeon: Lewayne Bunting, MD;  Location: [facility] ENDOSCOPY;  Service: Cardiovascular;  Laterality: N/A;   Family History:  Family History  Problem Relation Age of Onset   Hypertension Other    Diabetes Mellitus II Other    Family Psychiatric History: unknown   Social  History:  Social History   Substance and Sexual Activity  Alcohol Use Yes     Social History   Substance and Sexual Activity  Drug Use Not Currently   Types: Cocaine   Comment: Last used in July 2023    Social History   Socioeconomic History   Marital status: Single    Spouse name: Not on file   Number of children: Not on file   Years of education: Not on file   Highest education level: Not on file  Occupational History   Not on file  Tobacco Use   Smoking status: Every Day    Current packs/day: 0.25    Types: Cigarettes    Passive exposure: Current   Smokeless tobacco: Never  Vaping Use   Vaping status: Never Used  Substance and Sexual Activity   Alcohol use: Yes   Drug use: Not Currently    Types: Cocaine    Comment: Last used in July 2023   Sexual activity: Not Currently  Other Topics Concern   Not on file  Social History Narrative   Not on file   Social Determinants of Health   Financial Resource Strain: Not on File (11/18/2021)   Received from [employer] [employer]    Financial Resource Strain: 0  Food Insecurity: Not on File (04/27/2023)  Received from [employer]    Food: 0  Transportation Needs: Unmet Transportation Needs (12/28/2022)   PRAPARE - Administrator, Civil Service (Medical): Yes    Lack of Transportation (Non-Medical): Yes  Physical Activity: Not on File (11/18/2021)   Received from [place], [place]   Physical Activity    Physical Activity: 0  Stress: Not on File (11/18/2021)   Received from [facility], [place]   Stress    Stress: 0  Social Connections: Not on File (04/15/2023)   Received from [employer]   Social Connections    Connectedness: 0    Tobacco Cessation:  N/A, patient does not currently use tobacco products  Current Medications: Current Facility-Administered Medications  Medication Dose Route Frequency Provider Last Rate Last Admin   apixaban (ELIQUIS) tablet 5 mg  5 mg Oral BID Linwood Dibbles,  MD   5 mg at 06/12/23 0946   diphenhydrAMINE (BENADRYL) capsule 25 mg  25 mg Oral Q8H PRN Linwood Dibbles, MD   25 mg at 06/11/23 2047   gabapentin (NEURONTIN) capsule 300 mg  300 mg Oral TID Nira Conn, MD   300 mg at 06/12/23 0944   melatonin tablet 5 mg  5 mg Oral QHS Ophelia Shoulder E, NP   5 mg at 06/11/23 2133   metFORMIN (GLUCOPHAGE) tablet 1,000 mg  1,000 mg Oral BID WC Ophelia Shoulder E, NP   1,000 mg at 06/12/23 0944   sertraline (ZOLOFT) tablet 25 mg  25 mg Oral Daily Ophelia Shoulder E, NP   25 mg at 06/12/23 2956   Current Outpatient Medications  Medication Sig Dispense Refill   apixaban (ELIQUIS) 5 MG TABS tablet Take 5 mg by mouth 2 (two) times daily.     APIXABAN (ELIQUIS) VTE STARTER PACK (10MG  AND 5MG ) Take as directed on package: start with two-5mg  tablets twice daily for 7 days. On day 8, switch to one-5mg  tablet twice daily. (Patient not taking: Reported on 06/09/2023) 74 each 0   gabapentin (NEURONTIN) 300 MG capsule Take 300 mg by mouth 3 (three) times daily.     metFORMIN (GLUCOPHAGE) 1000 MG tablet Take 1,000 mg by mouth 2 (two) times daily with a meal.     metFORMIN (GLUCOPHAGE) 500 MG tablet Take 1 tablet (500 mg total) by mouth 2 (two) times daily with a meal. (Patient not taking: Reported on 06/09/2023) 60 tablet 0   pantoprazole (PROTONIX) 40 MG tablet Take 40 mg by mouth daily.     QUEtiapine (SEROQUEL) 300 MG tablet Take 1 tablet (300 mg total) by mouth 2 (two) times daily. 60 tablet 0   traZODone (DESYREL) 50 MG tablet Take 1 tablet (50 mg total) by mouth at bedtime as needed for sleep. 30 tablet 0   PTA Medications: (Not in a hospital admission)   [place] Scale:  Flowsheet Row ED from 06/08/2023 in [facility] Emergency Department at [facility] ED from 02/12/2023 in [facility] Emergency Department at [facility] ED from 01/16/2023 in [facility] Emergency Department at [facility]  C-SSRS RISK CATEGORY High Risk High Risk No Risk        Musculoskeletal: Strength & Muscle Tone: within normal limits Gait & Station: normal Patient leans: N/A  Psychiatric Specialty Exam: Presentation  General Appearance:  Appropriate for Environment  Eye Contact: Good  Speech: Clear and Coherent  Speech Volume: Normal  Handedness: Right   Mood and Affect  Mood: Euthymic  Affect: Congruent   Thought  Process  Thought Processes: Coherent  Descriptions of Associations:Intact  Orientation:Full (Time, Place and Person)  Thought Content:WDL  History of Schizophrenia/Schizoaffective disorder:No  Duration of Psychotic Symptoms:N/A  Hallucinations:Hallucinations: None  Ideas of Reference:None  Suicidal Thoughts:Suicidal Thoughts: No  Homicidal Thoughts:Homicidal Thoughts: No   Sensorium  Memory: Immediate Fair; Recent Fair  Judgment: Fair  Insight: Fair   Art therapist  Concentration: Good  Attention Span: Good  Recall: Good  Fund of Knowledge: Good  Language: Good   Psychomotor Activity  Psychomotor Activity: Psychomotor Activity: Normal   Assets  Assets: Desire for Improvement; Resilience; Communication Skills   Sleep  Sleep: Sleep: Good    Physical Exam: Physical Exam Neurological:     Mental Status: He is alert and oriented to person, place, and time.  Psychiatric:        Attention and Perception: Attention normal.        Mood and Affect: Mood normal.        Speech: Speech normal.        Behavior: Behavior is cooperative.        Thought Content: Thought content normal.    Review of Systems  Constitutional:        Double amputee  Psychiatric/Behavioral:  Positive for substance abuse.        Malingering, homeless, chronic substance abuse  All other systems reviewed and are negative.  Blood pressure 124/74, pulse 73, temperature 97.8 F (36.6 C), temperature source Oral, resp. rate 18, SpO2 97%. There is no height or weight on file to calculate  BMI.   Demographic Factors:  Male and Low socioeconomic status  Loss Factors: Decline in physical health  Historical Factors: Impulsivity  Risk Reduction Factors:   Positive coping skills or problem solving skills  Continued Clinical Symptoms:  More than one psychiatric diagnosis  Cognitive Features That Contribute To Risk:  None    Suicide Risk:  Mild:  Suicidal ideation of limited frequency, intensity, duration, and specificity.  There are no identifiable plans, no associated intent, mild dysphoria and related symptoms, good self-control (both objective and subjective assessment), few other risk factors, and identifiable protective factors, including available and accessible social support.    Plan Of Care/Follow-up recommendations:  Other:  Please follow up with outpatient providers  Medical Decision Making: Pt case reviewed and discussed with Dr. Lucianne Muss. Pt does not meet criteria for IVC or inpatient psychiatric treatment. Numerous resources have been added to AVS. Pt has a good plan in place. He does have an Iphone and has been in contact with [facility] who has been helping him get into low income housing. They will continue to assist him in outpatient basis.    Disposition: psych cleared for discharge  Eligha Bridegroom, NP 06/12/2023, 11:04 AM

## 2023-06-12 NOTE — ED Notes (Signed)
[facility] called Laretta Bolster (OCS Manager) at [facility] to inquire about pts housing placement. [facility] left a HIPAA complaint voicemail to return the call. Pt has been working with [employer]. [facility] called Angie last week and left a message but did not receive a response. Pt reports that he has an apartment that is waiting for him to move into however he owes $165 to the power company which must be paid in order to move in. Pt receives a disability check monthly of $1565.   Jacquelynn Cree, [facility]  06/12/23

## 2023-06-14 ENCOUNTER — Telehealth: Payer: Self-pay | Admitting: Family

## 2023-06-14 NOTE — Telephone Encounter (Signed)
Pt called stating he seen [place] yesterday and Denny Peon was putting in a order for a new wheelchair. Please call pt at [phone].

## 2023-06-15 ENCOUNTER — Other Ambulatory Visit: Payer: Self-pay

## 2023-06-15 ENCOUNTER — Emergency Department (HOSPITAL_COMMUNITY): Payer: Medicare (Managed Care)

## 2023-06-15 ENCOUNTER — Encounter (HOSPITAL_COMMUNITY): Payer: Self-pay | Admitting: Emergency Medicine

## 2023-06-15 ENCOUNTER — Emergency Department (HOSPITAL_COMMUNITY)
Admission: EM | Admit: 2023-06-15 | Discharge: 2023-06-15 | Disposition: A | Discharge status: Home / Self Care | Payer: Medicare (Managed Care) | Attending: Emergency Medicine | Admitting: Emergency Medicine

## 2023-06-15 DIAGNOSIS — Z7901 Long term (current) use of anticoagulants: Secondary | ICD-10-CM | POA: Insufficient documentation

## 2023-06-15 DIAGNOSIS — I4891 Unspecified atrial fibrillation: Secondary | ICD-10-CM | POA: Insufficient documentation

## 2023-06-15 DIAGNOSIS — Z79899 Other long term (current) drug therapy: Secondary | ICD-10-CM | POA: Diagnosis not present

## 2023-06-15 DIAGNOSIS — R0789 Other chest pain: Secondary | ICD-10-CM | POA: Insufficient documentation

## 2023-06-15 DIAGNOSIS — R0602 Shortness of breath: Secondary | ICD-10-CM | POA: Diagnosis not present

## 2023-06-15 DIAGNOSIS — I10 Essential (primary) hypertension: Secondary | ICD-10-CM | POA: Insufficient documentation

## 2023-06-15 DIAGNOSIS — Z7984 Long term (current) use of oral hypoglycemic drugs: Secondary | ICD-10-CM | POA: Diagnosis not present

## 2023-06-15 DIAGNOSIS — E119 Type 2 diabetes mellitus without complications: Secondary | ICD-10-CM | POA: Diagnosis not present

## 2023-06-15 DIAGNOSIS — R079 Chest pain, unspecified: Secondary | ICD-10-CM

## 2023-06-15 LAB — I-STAT CHEM 8, ED
BUN: 11 mg/dL (ref 8–23)
Calcium, Ion: 1.16 mmol/L (ref 1.15–1.40)
Chloride: 106 mmol/L (ref 98–111)
Creatinine, Ser: 0.6 mg/dL — ABNORMAL LOW (ref 0.61–1.24)
Glucose, Bld: 100 mg/dL — ABNORMAL HIGH (ref 70–99)
HCT: 39 % (ref 39.0–52.0)
Hemoglobin: 13.3 g/dL (ref 13.0–17.0)
Potassium: 3.3 mmol/L — ABNORMAL LOW (ref 3.5–5.1)
Sodium: 142 mmol/L (ref 135–145)
TCO2: 19 mmol/L — ABNORMAL LOW (ref 22–32)

## 2023-06-15 LAB — BASIC METABOLIC PANEL
Anion gap: 9 (ref 5–15)
BUN: 11 mg/dL (ref 8–23)
CO2: 24 mmol/L (ref 22–32)
Calcium: 9.4 mg/dL (ref 8.9–10.3)
Chloride: 107 mmol/L (ref 98–111)
Creatinine, Ser: 0.71 mg/dL (ref 0.61–1.24)
GFR, Estimated: >60 mL/min (ref >60)
Glucose, Bld: 98 mg/dL (ref 70–99)
Potassium: 3.6 mmol/L (ref 3.5–5.1)
Sodium: 140 mmol/L (ref 135–145)

## 2023-06-15 LAB — TROPONIN I (HIGH SENSITIVITY)
Troponin I (High Sensitivity): 5 ng/L (ref <18)
Troponin I (High Sensitivity): 4 ng/L (ref <18)

## 2023-06-15 LAB — PROTIME-INR
INR: 1.2 (ref 0.8–1.2)
Prothrombin Time: 15.4 s — ABNORMAL HIGH (ref 11.4–15.2)

## 2023-06-15 LAB — CBC
HCT: 41.2 % (ref 39.0–52.0)
Hemoglobin: 13.9 g/dL (ref 13.0–17.0)
MCH: 32.7 pg (ref 26.0–34.0)
MCHC: 33.7 g/dL (ref 30.0–36.0)
MCV: 96.9 fL (ref 80.0–100.0)
Platelets: 96 10*3/uL — ABNORMAL LOW (ref 150–400)
RBC: 4.25 MIL/uL (ref 4.22–5.81)
RDW: 13.7 % (ref 11.5–15.5)
WBC: 5.3 10*3/uL (ref 4.0–10.5)
nRBC: 0 % (ref 0.0–0.2)

## 2023-06-15 LAB — HEPATIC FUNCTION PANEL
ALT: 16 U/L (ref 0–44)
AST: 25 U/L (ref 15–41)
Albumin: 3.1 g/dL — ABNORMAL LOW (ref 3.5–5.0)
Alkaline Phosphatase: 100 U/L (ref 38–126)
Bilirubin, Direct: 0.3 mg/dL — ABNORMAL HIGH (ref 0.0–0.2)
Indirect Bilirubin: 0.9 mg/dL (ref 0.3–0.9)
Total Bilirubin: 1.2 mg/dL — ABNORMAL HIGH (ref <1.2)
Total Protein: 6.2 g/dL — ABNORMAL LOW (ref 6.5–8.1)

## 2023-06-15 LAB — D-DIMER, QUANTITATIVE: D-Dimer, Quant: <0.27 ug{FEU}/mL (ref 0.00–0.50)

## 2023-06-15 LAB — BRAIN NATRIURETIC PEPTIDE: B Natriuretic Peptide: 21 pg/mL (ref 0.0–100.0)

## 2023-06-15 MED ORDER — APIXABAN 5 MG PO TABS
5.0000 mg | ORAL_TABLET | Freq: Two times a day (BID) | ORAL | 0 refills | Status: DC
  Filled 2023-06-15: qty 60, 30d supply, fill #0

## 2023-06-15 MED ORDER — APIXABAN 5 MG PO TABS: 5.0000 mg | ORAL_TABLET | Freq: Two times a day (BID) | ORAL | Status: DC

## 2023-06-15 MED ORDER — SODIUM CHLORIDE 0.9 % IV BOLUS
500.0000 mL | Freq: Once | INTRAVENOUS | Status: AC
  Administered 2023-06-15: 500 mL via INTRAVENOUS

## 2023-06-15 MED ORDER — APIXABAN 5 MG PO TABS
5.0000 mg | ORAL_TABLET | Freq: Two times a day (BID) | ORAL | Status: DC
  Administered 2023-06-15: 5 mg via ORAL
  Filled 2023-06-15: qty 1

## 2023-06-15 NOTE — ED Provider Triage Note (Signed)
Emergency Medicine Provider Triage Evaluation Note  Cory Palmer , a 62 y.o. male  was evaluated in triage.  Pt complains of chest pain shortness of breath.  He states he has been able to take his medications including his Eliquis.  Patient denies any hemoptysis.  Patient states he is also here because it is cold and raining outside.  Patient states he is having left-sided chest tightness but is unsure when this began.  Patient denies any fevers.  Review of Systems  Positive: See HPI Negative: See HPI  Physical Exam  There were no vitals taken for this visit. Gen:   Awake, no distress   Resp:  Normal effort  MSK:   Moves extremities without difficulty  Other:  Lungs clear  Medical Decision Making  Medically screening exam initiated at 4:33 PM.  Appropriate orders placed.  Cory Palmer was informed that the remainder of the evaluation will be completed by another provider, this initial triage assessment does not replace that evaluation, and the importance of remaining in the ED until their evaluation is complete.  Workup initiated, patient stable at this time   Cory Palmer 06/15/23 1634

## 2023-06-15 NOTE — ED Notes (Signed)
RN called CCMD to place pt on monitor in system

## 2023-06-15 NOTE — ED Provider Notes (Signed)
[place] EMERGENCY DEPARTMENT AT [facility] Provider Note   CSN: [id number] Arrival date & time: 06/15/23  1608     History  Chief Complaint  Patient presents with   Chest Pain    Cory Palmer is a 62 y.o. male history of cirrhosis, A-fib not currently on his Eliquis, bipolar, diabetes, hypertension, malingering, substance abuse presented for shortness of breath and chest pain.  Patient was evaluated by this provider in triage and He states he has not been able to take his medications including his Eliquis.  Patient denies any hemoptysis.  Patient states he is also here because it is cold and raining outside.  Patient states he is having left-sided chest tightness but is unsure when this began.  Patient denies any fevers.   Denies any fevers, productive cough, leg swelling or calf tenderness  Home Medications Prior to Admission medications   Medication Sig Start Date End Date Taking? Authorizing Provider  apixaban (ELIQUIS) 5 MG TABS tablet Take 1 tablet (5 mg total) by mouth 2 (two) times daily. 06/15/23   Netta Corrigan, PA-C  APIXABAN Everlene Balls) VTE STARTER PACK (10MG  AND 5MG ) Take as directed on package: start with two-5mg  tablets twice daily for 7 days. On day 8, switch to one-5mg  tablet twice daily. Patient not taking: Reported on 06/09/2023 01/16/23   Glyn Ade, MD  gabapentin (NEURONTIN) 300 MG capsule Take 300 mg by mouth 3 (three) times daily.    [provider]  metFORMIN (GLUCOPHAGE) 1000 MG tablet Take 1,000 mg by mouth 2 (two) times daily with a meal.    [provider]  metFORMIN (GLUCOPHAGE) 500 MG tablet Take 1 tablet (500 mg total) by mouth 2 (two) times daily with a meal. Patient not taking: Reported on 06/09/2023 01/16/23   Glyn Ade, MD  pantoprazole (PROTONIX) 40 MG tablet Take 40 mg by mouth daily. 10/18/22   [provider]  QUEtiapine (SEROQUEL) 300 MG tablet Take 1 tablet (300 mg total) by mouth 2 (two)  times daily. 01/16/23   Glyn Ade, MD  traZODone (DESYREL) 50 MG tablet Take 1 tablet (50 mg total) by mouth at bedtime as needed for sleep. 01/16/23   Glyn Ade, MD      Allergies    Atorvastatin calcium, Gemfibrozil, and Insulin glargine    Review of Systems   Review of Systems  Cardiovascular:  Positive for chest pain.    Physical Exam Updated Vital Signs BP 119/83   Pulse 67   Temp 97.8 F (36.6 C) (Oral)   Resp 18   SpO2 99%  Physical Exam Vitals reviewed.  Constitutional:      General: He is not in acute distress. HENT:     Head: Normocephalic and atraumatic.  Eyes:     Extraocular Movements: Extraocular movements intact.     Conjunctiva/sclera: Conjunctivae normal.     Pupils: Pupils are equal, round, and reactive to light.  Cardiovascular:     Rate and Rhythm: Normal rate and regular rhythm.     Pulses: Normal pulses.     Heart sounds: Normal heart sounds.     Comments: 2+ bilateral radial/dorsalis pedis pulses with regular rate Pulmonary:     Effort: Pulmonary effort is normal. No respiratory distress.     Breath sounds: Normal breath sounds.  Abdominal:     Palpations: Abdomen is soft.     Tenderness: There is no abdominal tenderness. There is no guarding or rebound.  Musculoskeletal:  General: Normal range of motion.     Cervical back: Normal range of motion and neck supple.     Comments: 5 out of 5 bilateral grip/leg extension strength  Skin:    General: Skin is warm and dry.     Capillary Refill: Capillary refill takes less than 2 seconds.  Neurological:     General: No focal deficit present.     Mental Status: He is alert and oriented to person, place, and time.     Comments: Sensation intact in all 4 limbs  Psychiatric:        Mood and Affect: Mood normal.     ED Results / Procedures / Treatments   Labs (all labs ordered are listed, but only abnormal results are displayed) Labs Reviewed  CBC - Abnormal; Notable for the  following components:      Result Value   Platelets 96 (*)    All other components within normal limits  HEPATIC FUNCTION PANEL - Abnormal; Notable for the following components:   Total Protein 6.2 (*)    Albumin 3.1 (*)    Total Bilirubin 1.2 (*)    Bilirubin, Direct 0.3 (*)    All other components within normal limits  PROTIME-INR - Abnormal; Notable for the following components:   Prothrombin Time 15.4 (*)    All other components within normal limits  I-STAT CHEM 8, ED - Abnormal; Notable for the following components:   Potassium 3.3 (*)    Creatinine, Ser 0.60 (*)    Glucose, Bld 100 (*)    TCO2 19 (*)    All other components within normal limits  BASIC METABOLIC PANEL  BRAIN NATRIURETIC PEPTIDE  D-DIMER, QUANTITATIVE  TROPONIN I (HIGH SENSITIVITY)  TROPONIN I (HIGH SENSITIVITY)    EKG EKG Interpretation Date/Time:  Thursday June 15 2023 18:14:23 EST Ventricular Rate:  77 PR Interval:    QRS Duration:  106 QT Interval:  463 QTC Calculation: 525 R Axis:   77  Text Interpretation: Atrial fibrillation Ventricular premature complex Low voltage, precordial leads Prolonged QT interval Confirmed by Vonita Moss [phone]) on 06/15/2023 7:41:42 PM  Radiology DG Chest Port 1 View  Result Date: 06/15/2023 CLINICAL DATA:  Chest pain.  Shortness of breath. EXAM: PORTABLE CHEST 1 VIEW COMPARISON:  Chest radiograph dated June 08, 2023. FINDINGS: The heart size and mediastinal contours are within normal limits. Both lungs are clear. No acute osseous abnormality. IMPRESSION: No acute cardiopulmonary findings. Electronically Signed   By: Hart Robinsons M.D.   On: 06/15/2023 19:29    Procedures Procedures    Medications Ordered in ED Medications  apixaban (ELIQUIS) tablet 5 mg (has no administration in time range)  sodium chloride 0.9 % bolus 500 mL (500 mLs Intravenous New Bag/Given 06/15/23 1823)    ED Course/ Medical Decision Making/ A&P                                  Medical Decision Making Amount and/or Complexity of Data Reviewed Labs: ordered. Radiology: ordered.   Cory Palmer 62 y.o. presented today for chest pain. Working DDx that I considered at this time includes, but not limited to, ACS, GERD, pulmonary embolism, community-acquired pneumonia, aortic dissection, pneumothorax, underlying bony abnormality, anemia, thyrotoxicosis, esophageal rupture, CHF exacerbation, valvular disorder, myocarditis, pericarditis, endocarditis, pericardial effusion/cardiac tamponade, pulmonary edema, gastritis/PUD, esophagitis.  R/o Dx: ACS, GERD, pulmonary embolism, community-acquired pneumonia, aortic dissection, pneumothorax, underlying bony abnormality,  anemia, thyrotoxicosis, esophageal rupture, CHF exacerbation, valvular disorder, myocarditis, pericarditis, endocarditis, pericardial effusion/cardiac tamponade, pulmonary edema, gastritis/PUD, esophagitis: These are considered less likely due to history of present illness and physical exam findings.  PE: D-dimer negative Aortic Dissection: less likely based on the location, quality, onset, and severity of symptoms in this case.Patient also has a lack of underlying history of AD or TAA.   Review of prior external notes: 06/08/2023 ED  Unique Tests and My Interpretation:  EKG: A-fib 77 bpm, PVCs noted with prolonged QT Troponin: 5, 4 CXR: No acute cardiopulmonary changes CBC: Unremarkable PT/INR: Unremarkable Hepatic function panel: Unremarkable D-dimer: Negative BMP: Unremarkable BNP: Unremarkable I-STAT Chem-8: Unremarkable  Social Determinants of Health: homeless  Discussion with Independent Historian: None  Discussion of Management of Tests: None  Risk: Medium: prescription drug management  Risk Stratification Score: none  Staffed with Eloise Harman, MD  Plan: On exam patient was in in no acute distress with stable vitals.  Patient was seen by this provider in triage and had unremarkable  physical exam.  When patient was moved to the room however patient's blood pressure was noted be 85/65 so we will give 500 mL of fluid.  Patient's labs were ultimately reassuring however patient does endorse not be able take his Eliquis and was endorsing chest pain shortness of breath and so we will get D-dimer.  Patient's labs and imaging were all reassuring.  Patient D-dimer was negative so will not scan for PE at this time.  Patient's blood pressure did improve with the fluids and patient is been resting comfortably since then with stable vitals for the past 4 hours.  Patient given 1 dose of his Eliquis here and represcribe his Eliquis and encouraged to pick up the prescription.  Patient was given a sandwich here and afterwards will be discharged.  Patient was given return precautions. Patient stable for discharge at this time.  Patient verbalized understanding of plan.  Discussed smoking cessation with patient and was they were offerred resources to help stop.  Total time was 5 min CPT code 16109.   This chart was dictated using voice recognition software.  Despite best efforts to proofread,  errors can occur which can change the documentation meaning.         Final Clinical Impression(s) / ED Diagnoses Final diagnoses:  Chest pain, unspecified type    Rx / DC Orders ED Discharge Orders          Ordered    apixaban (ELIQUIS) 5 MG TABS tablet  2 times daily        06/15/23 1951              Remi Deter 06/15/23 1954    Rondel Baton, MD 06/17/23 [phone]

## 2023-06-15 NOTE — ED Triage Notes (Signed)
Pt BIB [facility] with reports of CP and SHOB. PT reports being out of his blood thinner for an extended period of time.

## 2023-06-15 NOTE — Discharge Instructions (Addendum)
Please follow-up with your primary care provider in regards to recent ER visit.  Today your labs and imaging are reassuring and you are safe to be discharged.  You are given 1 dose of Eliquis here before your discharge however you will need to pick up the rest the prescription at your pharmacy.  If symptoms change or worsen please return to ER.

## 2023-06-16 ENCOUNTER — Telehealth: Payer: Self-pay | Admitting: Orthopedic Surgery

## 2023-06-16 ENCOUNTER — Other Ambulatory Visit (HOSPITAL_COMMUNITY): Payer: Self-pay

## 2023-06-16 NOTE — Telephone Encounter (Signed)
Working on compassionate care donation of wheelchair from possibly [employer].

## 2023-06-16 NOTE — Telephone Encounter (Signed)
Patient called and said that he his wheerchair doesn't work. He needs a wheelchair. CB#[phone]

## 2023-06-16 NOTE — Telephone Encounter (Signed)
Duplicate message working on compassionate care donation of a wheelchair for this pt.

## 2023-06-21 NOTE — Telephone Encounter (Signed)
Wheelchair secured for pt and multiple attempts have been made to reach the pt to advise. Will hold message and try again.

## 2023-06-23 NOTE — Telephone Encounter (Signed)
Tried calling pt and message states call can not be completed as dialed. Will continue to try and reach the pt.

## 2023-06-27 NOTE — Telephone Encounter (Signed)
Call can not be completed as dialed. Unable to reach pt he has not been in the hospital. Wheelchair is here in the office and if the pt is to reach out again we can give it to him.

## 2023-07-05 ENCOUNTER — Encounter: Payer: Self-pay | Admitting: *Deleted

## 2023-07-05 NOTE — Progress Notes (Signed)
Pt attended 12/28/22 screening event where his b/p was 93/56 and his blood sugar was 194 and his A1C was 6.6. At the event, the pt noted he was homeless, did not note a PCP name, did have insurance, did not share a phone number, and identified food, housing, and transportation insecurities. During the initial and 60 day event f/u, health equity team member was able to confirm through pt's PCP that he had assistance with the [place] [facility] team to get to his PCP appt in June, July appts and had upcoming August appt. Chart review indicates pt has had ongoing ortho care by Dr. [name] and his office, who procured a "compassionate care" w/c for pt to pick up at Dr. Audrie Lia office (free to pt.) During this 6 month event f/u, health equity team member unable to reach pt by phone but left message to call and to f/u with Dr. Audrie Lia office about getting his w/c which was waiting for him. Debria Garret, NP, [address] office also called and confirmed pt was seen on 06/13/23 and had appt today, 07/05/23. Per previous [employer] community health worker note, [facility] team working with pt to find housing. No known address available for health equity team to mail housing and food info + pt had active PCP and [place] [facility] team support; therefore no additional health equity team support scheduled at this time.

## 2023-08-03 ENCOUNTER — Emergency Department (HOSPITAL_COMMUNITY)
Admission: EM | Admit: 2023-08-03 | Discharge: 2023-08-03 | Disposition: A | Discharge status: Home / Self Care | Payer: 59 | Attending: Emergency Medicine | Admitting: Emergency Medicine

## 2023-08-03 ENCOUNTER — Emergency Department (HOSPITAL_COMMUNITY): Payer: 59

## 2023-08-03 DIAGNOSIS — Z7901 Long term (current) use of anticoagulants: Secondary | ICD-10-CM | POA: Insufficient documentation

## 2023-08-03 DIAGNOSIS — E119 Type 2 diabetes mellitus without complications: Secondary | ICD-10-CM | POA: Diagnosis not present

## 2023-08-03 DIAGNOSIS — Z7984 Long term (current) use of oral hypoglycemic drugs: Secondary | ICD-10-CM | POA: Insufficient documentation

## 2023-08-03 DIAGNOSIS — I10 Essential (primary) hypertension: Secondary | ICD-10-CM | POA: Diagnosis not present

## 2023-08-03 DIAGNOSIS — Z79899 Other long term (current) drug therapy: Secondary | ICD-10-CM | POA: Insufficient documentation

## 2023-08-03 DIAGNOSIS — R55 Syncope and collapse: Secondary | ICD-10-CM | POA: Diagnosis present

## 2023-08-03 DIAGNOSIS — R079 Chest pain, unspecified: Secondary | ICD-10-CM | POA: Diagnosis not present

## 2023-08-03 LAB — COMPREHENSIVE METABOLIC PANEL
ALT: 13 U/L (ref 0–44)
AST: 35 U/L (ref 15–41)
Albumin: 3.8 g/dL (ref 3.5–5.0)
Alkaline Phosphatase: 86 U/L (ref 38–126)
Anion gap: 16 — ABNORMAL HIGH (ref 5–15)
BUN: <5 mg/dL — ABNORMAL LOW (ref 8–23)
CO2: 17 mmol/L — ABNORMAL LOW (ref 22–32)
Calcium: 9.3 mg/dL (ref 8.9–10.3)
Chloride: 103 mmol/L (ref 98–111)
Creatinine, Ser: 0.77 mg/dL (ref 0.61–1.24)
GFR, Estimated: >60 mL/min (ref >60)
Glucose, Bld: 122 mg/dL — ABNORMAL HIGH (ref 70–99)
Potassium: 3.4 mmol/L — ABNORMAL LOW (ref 3.5–5.1)
Sodium: 136 mmol/L (ref 135–145)
Total Bilirubin: 3.3 mg/dL — ABNORMAL HIGH (ref 0.0–1.2)
Total Protein: 6.2 g/dL — ABNORMAL LOW (ref 6.5–8.1)

## 2023-08-03 LAB — CBC
HCT: 41.4 % (ref 39.0–52.0)
Hemoglobin: 14.6 g/dL (ref 13.0–17.0)
MCH: 33.5 pg (ref 26.0–34.0)
MCHC: 35.3 g/dL (ref 30.0–36.0)
MCV: 95 fL (ref 80.0–100.0)
Platelets: 97 10*3/uL — ABNORMAL LOW (ref 150–400)
RBC: 4.36 MIL/uL (ref 4.22–5.81)
RDW: 13.9 % (ref 11.5–15.5)
WBC: 4.7 10*3/uL (ref 4.0–10.5)
nRBC: 0 % (ref 0.0–0.2)

## 2023-08-03 LAB — TROPONIN I (HIGH SENSITIVITY)
Troponin I (High Sensitivity): <2 ng/L (ref <18)
Troponin I (High Sensitivity): <2 ng/L (ref <18)

## 2023-08-03 LAB — MAGNESIUM: Magnesium: 1.3 mg/dL — ABNORMAL LOW (ref 1.7–2.4)

## 2023-08-03 LAB — ETHANOL: Alcohol, Ethyl (B): <10 mg/dL (ref <10)

## 2023-08-03 MED ORDER — MAGNESIUM OXIDE -MG SUPPLEMENT 400 (240 MG) MG PO TABS
400.0000 mg | ORAL_TABLET | Freq: Once | ORAL | Status: AC
  Administered 2023-08-03: 400 mg via ORAL
  Filled 2023-08-03: qty 1

## 2023-08-03 MED ORDER — MAGNESIUM CHLORIDE 64 MG PO TBEC: 1.0000 | DELAYED_RELEASE_TABLET | Freq: Once | ORAL | Status: DC

## 2023-08-03 NOTE — ED Provider Notes (Signed)
 [place] EMERGENCY DEPARTMENT AT [facility] Provider Note   CSN: [id number] Arrival date & time: 08/03/23  1206     History  Chief Complaint  Patient presents with   Loss of Consciousness    Cory Palmer is a 63 y.o. male.   Loss of Consciousness    Patient has a history of diabetes bipolar disorder anxiety hypertension, paroxysmal atrial fibrillation, liver cirrhosis osteomyelitis status post amputations patient initially seen by me at triage.  Patient was sent to the ED for evaluation after a syncopal episode at the doctor's office.  Patient reported feeling like he was having a manic episode earlier today.  He took his nighttime meds in the morning.  He thinks that might of contributed to his lightheadedness.  However patient did experience some chest discomfort.  While he was in the wheelchair at the doctor's office he had a syncopal episode.  Patient states he fell and hit his head pretty hard EMS was called and they noted he was in atrial fibrillation with a rapid rate.  Heart rate up in the 150s.  He was given 20 mg of Cardizem  IV with improvement  Home Medications Prior to Admission medications   Medication Sig Start Date End Date Taking? Authorizing Provider  APIXABAN  (ELIQUIS ) VTE STARTER PACK (10MG  AND 5MG ) Take as directed on package: start with two-5mg  tablets twice daily for 7 days. On day 8, switch to one-5mg  tablet twice daily. 01/16/23  Yes Jerral Meth, MD  gabapentin  (NEURONTIN ) 300 MG capsule Take 300 mg by mouth 3 (three) times daily.   Yes [provider]  metFORMIN  (GLUCOPHAGE ) 500 MG tablet Take 1 tablet (500 mg total) by mouth 2 (two) times daily with a meal. 01/16/23  Yes Countryman, Chase, MD  QUEtiapine  (SEROQUEL ) 300 MG tablet Take 1 tablet (300 mg total) by mouth 2 (two) times daily. 01/16/23  Yes Jerral Meth, MD  traZODone  (DESYREL ) 50 MG tablet Take 1 tablet (50 mg total) by mouth at bedtime as needed for sleep. 01/16/23  Yes  Jerral Meth, MD  apixaban  (ELIQUIS ) 5 MG TABS tablet Take 1 tablet (5 mg total) by mouth 2 (two) times daily. Patient not taking: Reported on 08/03/2023 06/15/23   Victor Agent T, PA-C      Allergies    Atorvastatin calcium , Gemfibrozil, and Insulin  glargine    Review of Systems   Review of Systems  Cardiovascular:  Positive for syncope.    Physical Exam Updated Vital Signs BP (!) 128/100   Pulse 68   Temp 98 F (36.7 C) (Axillary)   Resp 18   SpO2 100%  Physical Exam Vitals and nursing note reviewed.  Constitutional:      General: He is not in acute distress.    Appearance: He is well-developed.  HENT:     Head: Normocephalic and atraumatic.     Right Ear: External ear normal.     Left Ear: External ear normal.  Eyes:     General: No scleral icterus.       Right eye: No discharge.        Left eye: No discharge.     Conjunctiva/sclera: Conjunctivae normal.  Neck:     Trachea: No tracheal deviation.  Cardiovascular:     Rate and Rhythm: Normal rate. Rhythm irregular.  Pulmonary:     Effort: Pulmonary effort is normal. No respiratory distress.     Breath sounds: Normal breath sounds. No stridor. No wheezing or rales.  Abdominal:  General: Bowel sounds are normal. There is no distension.     Palpations: Abdomen is soft.     Tenderness: There is no abdominal tenderness. There is no guarding or rebound.  Musculoskeletal:        General: No tenderness or deformity.     Cervical back: Neck supple.     Comments: Status post amputation bilateral lower extremities  Skin:    General: Skin is warm and dry.     Findings: No rash.  Neurological:     General: No focal deficit present.     Mental Status: He is alert.     Cranial Nerves: No cranial nerve deficit, dysarthria or facial asymmetry.     Sensory: No sensory deficit.     Motor: No abnormal muscle tone or seizure activity.     Coordination: Coordination normal.  Psychiatric:        Mood and Affect: Mood  normal.     ED Results / Procedures / Treatments   Labs (all labs ordered are listed, but only abnormal results are displayed) Labs Reviewed  CBC - Abnormal; Notable for the following components:      Result Value   Platelets 97 (*)    All other components within normal limits  MAGNESIUM  - Abnormal; Notable for the following components:   Magnesium  1.3 (*)    All other components within normal limits  COMPREHENSIVE METABOLIC PANEL - Abnormal; Notable for the following components:   Potassium 3.4 (*)    CO2 17 (*)    Glucose, Bld 122 (*)    BUN <5 (*)    Total Protein 6.2 (*)    Total Bilirubin 3.3 (*)    Anion gap 16 (*)    All other components within normal limits  ETHANOL  RAPID URINE DRUG SCREEN, HOSP PERFORMED  TROPONIN I (HIGH SENSITIVITY)  TROPONIN I (HIGH SENSITIVITY)    EKG EKG Interpretation Date/Time:  Thursday August 03 2023 12:12:34 EST Ventricular Rate:  86 PR Interval:    QRS Duration:  98 QT Interval:  416 QTC Calculation: 497 R Axis:   36  Text Interpretation: Atrial fibrillation Nonspecific ST and T wave abnormality Prolonged QT Abnormal ECG When compared with ECG of 15-Jun-2023 18:14, No significant change since last tracing Confirmed by Randol Simmonds [phone]) on 08/03/2023 12:26:16 PM  Radiology CT Head Wo Contrast Result Date: 08/03/2023 CLINICAL DATA:  Head trauma, moderate-severe; Neck trauma, midline tenderness (Age 2-64y). Syncopal episode with head strike. EXAM: CT HEAD WITHOUT CONTRAST CT CERVICAL SPINE WITHOUT CONTRAST TECHNIQUE: Multidetector CT imaging of the head and cervical spine was performed following the standard protocol without intravenous contrast. Multiplanar CT image reconstructions of the cervical spine were also generated. RADIATION DOSE REDUCTION: This exam was performed according to the departmental dose-optimization program which includes automated exposure control, adjustment of the mA and/or kV according to patient size and/or use  of iterative reconstruction technique. COMPARISON:  Head CT 12/05/2022.  CT cervical spine 05/02/2020. FINDINGS: CT HEAD FINDINGS Brain: No acute intracranial hemorrhage. Gray-white differentiation is preserved. No hydrocephalus or extra-axial collection. No mass effect or midline shift. Vascular: No hyperdense vessel or unexpected calcification. Skull: No calvarial fracture or suspicious bone lesion. Skull base is unremarkable. Sinuses/Orbits: Partial opacification of the ethmoid air cells and right maxillary sinus. Orbits are unremarkable. Other: None. CT CERVICAL SPINE FINDINGS Alignment: Normal. Skull base and vertebrae: No acute fracture. Normal craniocervical junction. No suspicious bone lesions. Diffuse idiopathic skeletal hyperostosis with prominent anterior osteophytes of the  lower cervical spine. Soft tissues and spinal canal: No prevertebral fluid or swelling. No visible canal hematoma. Disc levels: Unchanged mild cervical spondylosis without high-grade spinal canal stenosis. Upper chest: Parenchymal scarring in the right upper lobe. No acute findings. Other: Atherosclerotic calcifications of the carotid bulbs. IMPRESSION: 1. No acute intracranial abnormality. 2. No acute cervical spine fracture or traumatic listhesis. 3. Unchanged mild cervical spondylosis without high-grade spinal canal stenosis. Electronically Signed   By: Ryan Chess M.D.   On: 08/03/2023 16:59   CT Cervical Spine Wo Contrast Result Date: 08/03/2023 CLINICAL DATA:  Head trauma, moderate-severe; Neck trauma, midline tenderness (Age 27-64y). Syncopal episode with head strike. EXAM: CT HEAD WITHOUT CONTRAST CT CERVICAL SPINE WITHOUT CONTRAST TECHNIQUE: Multidetector CT imaging of the head and cervical spine was performed following the standard protocol without intravenous contrast. Multiplanar CT image reconstructions of the cervical spine were also generated. RADIATION DOSE REDUCTION: This exam was performed according to the  departmental dose-optimization program which includes automated exposure control, adjustment of the mA and/or kV according to patient size and/or use of iterative reconstruction technique. COMPARISON:  Head CT 12/05/2022.  CT cervical spine 05/02/2020. FINDINGS: CT HEAD FINDINGS Brain: No acute intracranial hemorrhage. Gray-white differentiation is preserved. No hydrocephalus or extra-axial collection. No mass effect or midline shift. Vascular: No hyperdense vessel or unexpected calcification. Skull: No calvarial fracture or suspicious bone lesion. Skull base is unremarkable. Sinuses/Orbits: Partial opacification of the ethmoid air cells and right maxillary sinus. Orbits are unremarkable. Other: None. CT CERVICAL SPINE FINDINGS Alignment: Normal. Skull base and vertebrae: No acute fracture. Normal craniocervical junction. No suspicious bone lesions. Diffuse idiopathic skeletal hyperostosis with prominent anterior osteophytes of the lower cervical spine. Soft tissues and spinal canal: No prevertebral fluid or swelling. No visible canal hematoma. Disc levels: Unchanged mild cervical spondylosis without high-grade spinal canal stenosis. Upper chest: Parenchymal scarring in the right upper lobe. No acute findings. Other: Atherosclerotic calcifications of the carotid bulbs. IMPRESSION: 1. No acute intracranial abnormality. 2. No acute cervical spine fracture or traumatic listhesis. 3. Unchanged mild cervical spondylosis without high-grade spinal canal stenosis. Electronically Signed   By: Ryan Chess M.D.   On: 08/03/2023 16:59   DG Chest 1 View Result Date: 08/03/2023 CLINICAL DATA:  Chest pain and syncope EXAM: CHEST  1 VIEW COMPARISON:  Multiple exams, including 06/15/2023 FINDINGS: Patchy scarring and subtle nodularity in the right upper lobe, this corresponds to the site of severe consolidation shown on 08/27/2022 which substantially improved by 12/13/2022, and does not appear substantially changed or  increased from 06/15/2023. The lungs appear otherwise clear. Cardiac and mediastinal margins appear normal. No blunting of the costophrenic angles. Aside from degenerative arthropathy of the glenohumeral joints, no significant bony findings are observed. IMPRESSION: 1. Patchy scarring and subtle nodularity in the right upper lobe, this corresponds to the site of severe consolidation shown on 08/27/2022 which substantially improved by 12/13/2022, and does not appear substantially changed or increased from 06/15/2023. 2. No acute findings. 3. Degenerative arthropathy of the glenohumeral joints. Electronically Signed   By: Ryan Salvage M.D.   On: 08/03/2023 16:19    Procedures Procedures    Medications Ordered in ED Medications  magnesium  oxide (MAG-OX) tablet 400 mg (400 mg Oral Given 08/03/23 1728)    ED Course/ Medical Decision Making/ A&P Clinical Course as of 08/03/23 1845  Thu Aug 03, 2023  1710 Patient is feeling better.  He has been able to eat and drink. [JK]  1710 Laboratory test do  not show any signs of severe electrolyte abnormalities.  Patient is not anemic. [JK]  1710   Cardiac enzymes are normal. [JK]  1710 Will give a dose of magnesium  with his decreased magnesium  level [JK]    Clinical Course User Index [JK] Randol Simmonds, MD                                 Medical Decision Making Problems Addressed: Syncope, unspecified syncope type: acute illness or injury that poses a threat to life or bodily functions  Amount and/or Complexity of Data Reviewed Labs: ordered. Decision-making details documented in ED Course. Radiology: ordered and independent interpretation performed.  Risk OTC drugs.   Pt presented to the ED for evaluation of syncope.  Pt has history of A fib.  Noted to be tachycardic earlier.  Treated by EMS  Asymptomatic in the ED.  No recurrent tachycardia.  Suspect possible vaso vagal episode combined with his rapid a fib.  No signs of serious injury from  his fall.  Pt feeling better.  Stable for outpatient follow up        Final Clinical Impression(s) / ED Diagnoses Final diagnoses:  Syncope, unspecified syncope type    Rx / DC Orders ED Discharge Orders     None         Randol Simmonds, MD 08/03/23 [id number]

## 2023-08-03 NOTE — Discharge Instructions (Addendum)
 Continue your current medications.  Follow-up with your doctor to be rechecked.  Return to the ED for any recurrent episodes

## 2023-08-03 NOTE — ED Triage Notes (Signed)
 Syncopal episode at Dr's office. Pt hit his head against tile floor. Feels like he is having manic episode and took night time psych meds this AM. Then pt became drowsy and passed out. Afib RVR with HR 100-156 bpm. EMS gave 20mg  Cardizem  and HR dropped to 90-100 bpm. On eliquis  but has not taken it for a month. Alert and oriented x 4. Also reports headache and dizziness.

## 2023-08-03 NOTE — ED Provider Triage Note (Signed)
 Emergency Medicine Provider Triage Evaluation Note  Cory Palmer , a 63 y.o. male  was evaluated in triage.  Pt complains of syncope.  Patient has history of multiple medical problems.  Patient also reports feeling like he was having a manic episode.  He took his nighttime psych meds this morning for that reason.  While he was at the doctor's office for an appointment he started feeling lightheaded.  He did have chest pain.  Patient is in a wheelchair and he had a syncopal episode.  Patient states he fell and hit his head pretty hard.  EMS was called and they noted his heart rate was up to 156.  He was given 20 mg of Cardizem  IV.  Patient states he is feeling better after their treatment  Review of Systems  Positive: Chest pain Negative:   Physical Exam  BP 108/70 (BP Location: Right Arm)   Pulse 100   Temp 97.7 F (36.5 C) (Oral)   Resp 20   SpO2 99%  Gen:   Awake, no distress Resp:  Normal effort  MSK:   Moves extremities without difficulty  Other:    Medical Decision Making  Medically screening exam initiated at 12:23 PM.  Appropriate orders placed.  Cory Palmer was informed that the remainder of the evaluation will be completed by another provider, this initial triage assessment does not replace that evaluation, and the importance of remaining in the ED until their evaluation is complete.  Will proceed with cardiac syncopal workup as well as imaging to rule out any serious injury associated with his fall   Randol Simmonds, MD 08/03/23 1225

## 2023-08-05 ENCOUNTER — Other Ambulatory Visit (HOSPITAL_COMMUNITY): Payer: Self-pay

## 2023-08-16 ENCOUNTER — Telehealth: Payer: Self-pay | Admitting: Orthopedic Surgery

## 2023-08-16 NOTE — Telephone Encounter (Signed)
 Pt called stating Dr Julio Ohm need to call him. Pt state he is having surgery to amputee both legs and states to need orders for [facility] for bil leg. Please call pt about this matter at [phone].

## 2023-08-17 NOTE — Telephone Encounter (Signed)
Lm on vm rx being faxed to [facility] this morning

## 2023-08-17 NOTE — Telephone Encounter (Signed)
Pt is already s/p bilateral BKA. No recent surgeries for this. I will send in an order to [facility] for new prosthetic supplies.

## 2023-08-19 IMAGING — DX DG CHEST 2V
2 series · 2 of 2 positions shown · non-contrast
Comparison: 01/28/2021

CLINICAL DATA: Chest pain, vomiting

EXAM:
CHEST - 2 VIEW

[chest lat]
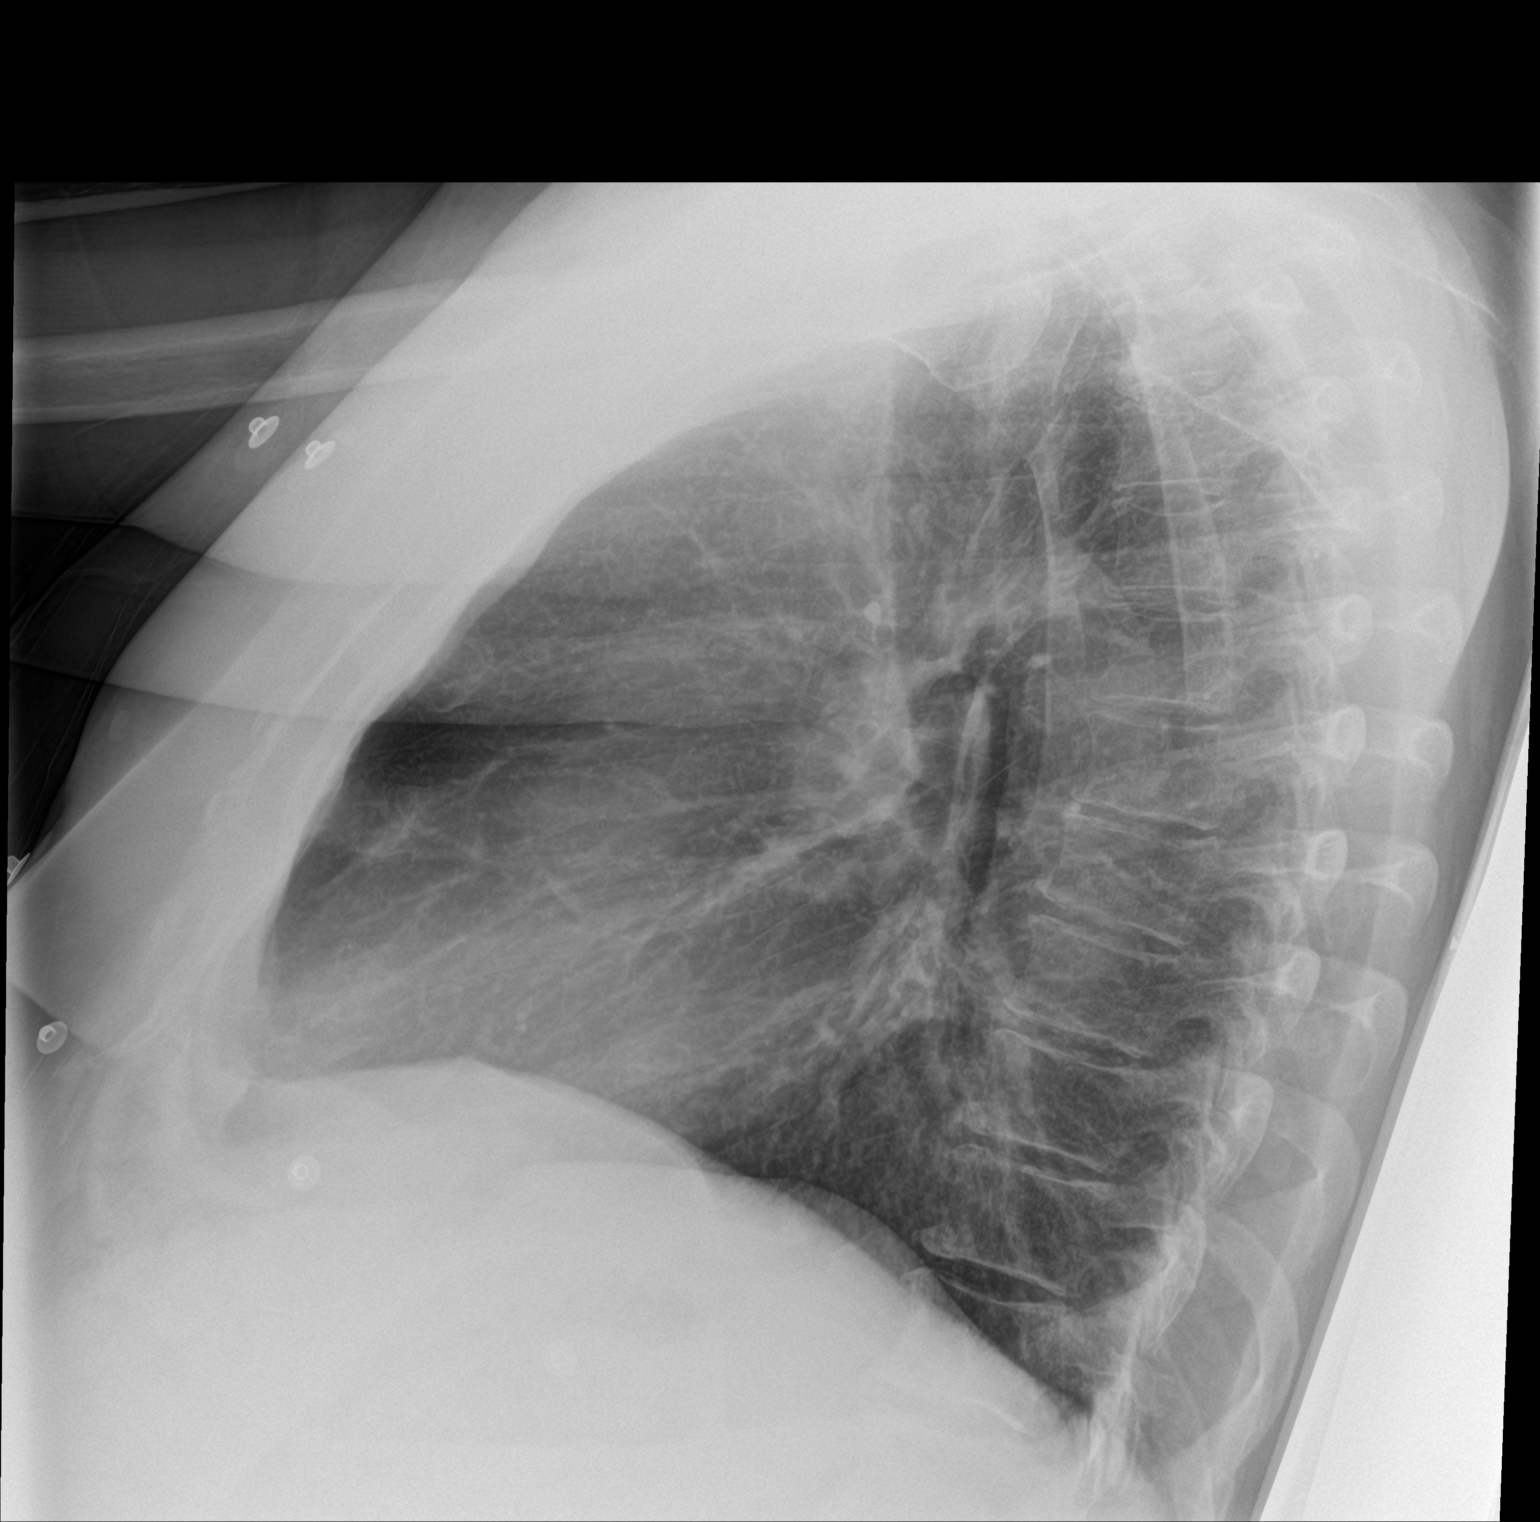

[chest ap]
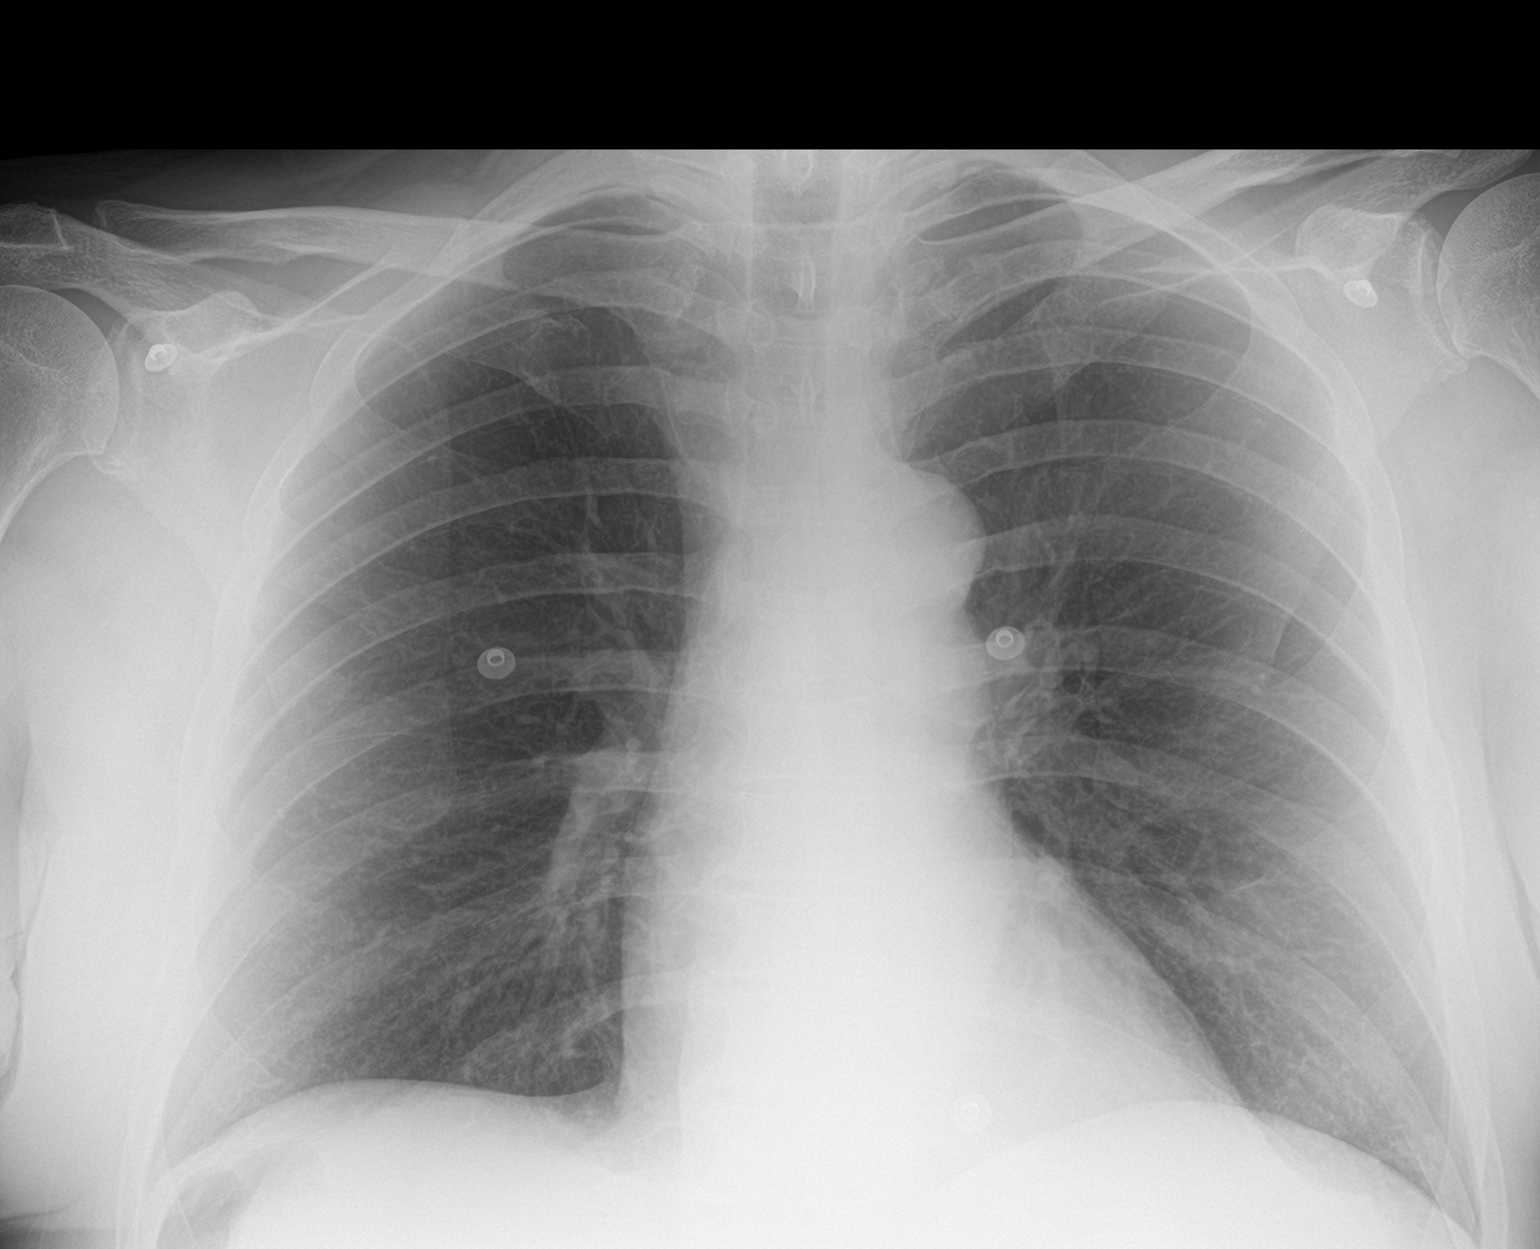

[2 of 2 positions shown; findings below may reference images not displayed]

FINDINGS: The heart size and mediastinal contours are within normal limits.
Both lungs are clear. The visualized skeletal structures are
unremarkable.
IMPRESSION: No active cardiopulmonary disease.

## 2023-08-23 ENCOUNTER — Telehealth: Payer: Self-pay | Admitting: Orthopedic Surgery

## 2023-08-23 NOTE — Telephone Encounter (Signed)
Pt called requesting a call back. Pt states he is not sure the name of company that was suppose to delivery his wheelchair yesterday but no one came and asking if we can find out what happened. Pt states he does not have the number to company that was suppose to deliver his chair. Please call pt at [phone].

## 2023-09-05 ENCOUNTER — Encounter (HOSPITAL_COMMUNITY): Payer: Self-pay | Admitting: Emergency Medicine

## 2023-09-05 ENCOUNTER — Emergency Department (HOSPITAL_COMMUNITY)
Admission: EM | Admit: 2023-09-05 | Discharge: 2023-09-06 | Disposition: A | Discharge status: Home / Self Care | Payer: 59 | Attending: Emergency Medicine | Admitting: Emergency Medicine

## 2023-09-05 ENCOUNTER — Other Ambulatory Visit: Payer: Self-pay

## 2023-09-05 DIAGNOSIS — E119 Type 2 diabetes mellitus without complications: Secondary | ICD-10-CM | POA: Insufficient documentation

## 2023-09-05 DIAGNOSIS — Z89511 Acquired absence of right leg below knee: Secondary | ICD-10-CM | POA: Insufficient documentation

## 2023-09-05 DIAGNOSIS — Z7984 Long term (current) use of oral hypoglycemic drugs: Secondary | ICD-10-CM | POA: Diagnosis not present

## 2023-09-05 DIAGNOSIS — F319 Bipolar disorder, unspecified: Secondary | ICD-10-CM | POA: Diagnosis not present

## 2023-09-05 DIAGNOSIS — R45851 Suicidal ideations: Secondary | ICD-10-CM | POA: Insufficient documentation

## 2023-09-05 DIAGNOSIS — Z7901 Long term (current) use of anticoagulants: Secondary | ICD-10-CM | POA: Insufficient documentation

## 2023-09-05 DIAGNOSIS — I10 Essential (primary) hypertension: Secondary | ICD-10-CM | POA: Diagnosis not present

## 2023-09-05 DIAGNOSIS — Z993 Dependence on wheelchair: Secondary | ICD-10-CM | POA: Insufficient documentation

## 2023-09-05 DIAGNOSIS — R451 Restlessness and agitation: Secondary | ICD-10-CM | POA: Diagnosis not present

## 2023-09-05 DIAGNOSIS — Z91148 Patient's other noncompliance with medication regimen for other reason: Secondary | ICD-10-CM | POA: Insufficient documentation

## 2023-09-05 DIAGNOSIS — F1414 Cocaine abuse with cocaine-induced mood disorder: Secondary | ICD-10-CM | POA: Diagnosis present

## 2023-09-05 DIAGNOSIS — Z89512 Acquired absence of left leg below knee: Secondary | ICD-10-CM | POA: Insufficient documentation

## 2023-09-05 DIAGNOSIS — Z79899 Other long term (current) drug therapy: Secondary | ICD-10-CM | POA: Insufficient documentation

## 2023-09-05 DIAGNOSIS — Z596 Low income: Secondary | ICD-10-CM | POA: Diagnosis not present

## 2023-09-05 DIAGNOSIS — R9431 Abnormal electrocardiogram [ECG] [EKG]: Secondary | ICD-10-CM | POA: Insufficient documentation

## 2023-09-05 DIAGNOSIS — R45850 Homicidal ideations: Secondary | ICD-10-CM | POA: Insufficient documentation

## 2023-09-05 LAB — COMPREHENSIVE METABOLIC PANEL
ALT: 18 U/L (ref 0–44)
AST: 32 U/L (ref 15–41)
Albumin: 3.8 g/dL (ref 3.5–5.0)
Alkaline Phosphatase: 73 U/L (ref 38–126)
Anion gap: 12 (ref 5–15)
BUN: <5 mg/dL — ABNORMAL LOW (ref 8–23)
CO2: 21 mmol/L — ABNORMAL LOW (ref 22–32)
Calcium: 9.2 mg/dL (ref 8.9–10.3)
Chloride: 104 mmol/L (ref 98–111)
Creatinine, Ser: 0.66 mg/dL (ref 0.61–1.24)
GFR, Estimated: >60 mL/min (ref >60)
Glucose, Bld: 167 mg/dL — ABNORMAL HIGH (ref 70–99)
Potassium: 3.6 mmol/L (ref 3.5–5.1)
Sodium: 137 mmol/L (ref 135–145)
Total Bilirubin: 2.3 mg/dL — ABNORMAL HIGH (ref 0.0–1.2)
Total Protein: 6.8 g/dL (ref 6.5–8.1)

## 2023-09-05 LAB — RAPID URINE DRUG SCREEN, HOSP PERFORMED
Amphetamines: POSITIVE — AB
Barbiturates: NOT DETECTED
Benzodiazepines: NOT DETECTED
Cocaine: POSITIVE — AB
Opiates: NOT DETECTED
Tetrahydrocannabinol: NOT DETECTED

## 2023-09-05 LAB — CBC
HCT: 44.8 % (ref 39.0–52.0)
Hemoglobin: 15.8 g/dL (ref 13.0–17.0)
MCH: 34 pg (ref 26.0–34.0)
MCHC: 35.3 g/dL (ref 30.0–36.0)
MCV: 96.3 fL (ref 80.0–100.0)
Platelets: 141 10*3/uL — ABNORMAL LOW (ref 150–400)
RBC: 4.65 MIL/uL (ref 4.22–5.81)
RDW: 14.1 % (ref 11.5–15.5)
WBC: 8 10*3/uL (ref 4.0–10.5)
nRBC: 0 % (ref 0.0–0.2)

## 2023-09-05 LAB — ETHANOL: Alcohol, Ethyl (B): 128 mg/dL — ABNORMAL HIGH (ref <10)

## 2023-09-05 LAB — SALICYLATE LEVEL: Salicylate Lvl: <7 mg/dL — ABNORMAL LOW (ref 7.0–30.0)

## 2023-09-05 LAB — ACETAMINOPHEN LEVEL: Acetaminophen (Tylenol), Serum: <10 ug/mL — ABNORMAL LOW (ref 10–30)

## 2023-09-05 MED ORDER — LORAZEPAM 1 MG PO TABS
1.0000 mg | ORAL_TABLET | Freq: Once | ORAL | Status: AC
  Administered 2023-09-05: 1 mg via ORAL
  Filled 2023-09-05: qty 1

## 2023-09-05 MED ORDER — TRAZODONE HCL 50 MG PO TABS
50.0000 mg | ORAL_TABLET | Freq: Every evening | ORAL | Status: DC | PRN
  Administered 2023-09-05: 50 mg via ORAL
  Filled 2023-09-05: qty 1

## 2023-09-05 MED ORDER — HALOPERIDOL LACTATE 5 MG/ML IJ SOLN
5.0000 mg | Freq: Once | INTRAMUSCULAR | Status: AC
  Administered 2023-09-05: 5 mg via INTRAMUSCULAR
  Filled 2023-09-05: qty 1

## 2023-09-05 MED ORDER — GABAPENTIN 300 MG PO CAPS
300.0000 mg | ORAL_CAPSULE | Freq: Three times a day (TID) | ORAL | Status: DC
  Filled 2023-09-05 (×3): qty 1

## 2023-09-05 MED ORDER — QUETIAPINE FUMARATE 100 MG PO TABS
300.0000 mg | ORAL_TABLET | Freq: Every day | ORAL | Status: DC
  Administered 2023-09-05: 300 mg via ORAL
  Filled 2023-09-05: qty 3

## 2023-09-05 NOTE — ED Provider Notes (Signed)
 [place] EMERGENCY DEPARTMENT AT [facility] Provider Note   CSN: [id number] Arrival date & time: 09/05/23  9194     History  Chief Complaint  Patient presents with   IVC    Cory Palmer is a 63 y.o. male.  HPI 63 year old male with history of depression, bipolar disorder, diabetes, hypertension presenting for IVC.  Patient states last night he felt like there were drug dealers out to get him.  Police were called to his apartment this morning.  They placed IVC.  The IVC reports that the patient made multiple statements stating that he was suicidal and homicidal.  He stated reportedly that he would obtain a gun, and would kill everyone including himself.  Reportedly had a knife at the time as well.  He was brought here for further evaluation under IVC by police.  Patient denies all of this.  He acknowledges that police were there this morning but denies ever stating he was going to harm themselves or others.  He denies SI or HI or hallucinations.  He states he supposed be taking medications for bipolar disorder but is not taking them right now.     Home Medications Prior to Admission medications   Medication Sig Start Date End Date Taking? Authorizing Provider  apixaban  (ELIQUIS ) 5 MG TABS tablet Take 1 tablet (5 mg total) by mouth 2 (two) times daily. Patient not taking: Reported on 08/03/2023 06/15/23   Victor Agent T, PA-C  APIXABAN  (ELIQUIS ) VTE STARTER PACK (10MG  AND 5MG ) Take as directed on package: start with two-5mg  tablets twice daily for 7 days. On day 8, switch to one-5mg  tablet twice daily. 01/16/23   Jerral Meth, MD  gabapentin  (NEURONTIN ) 300 MG capsule Take 300 mg by mouth 3 (three) times daily.    [provider]  metFORMIN  (GLUCOPHAGE ) 500 MG tablet Take 1 tablet (500 mg total) by mouth 2 (two) times daily with a meal. 01/16/23   Jerral Meth, MD  QUEtiapine  (SEROQUEL ) 300 MG tablet Take 1 tablet (300 mg total) by mouth 2 (two) times daily.  01/16/23   Jerral Meth, MD  traZODone  (DESYREL ) 50 MG tablet Take 1 tablet (50 mg total) by mouth at bedtime as needed for sleep. 01/16/23   Jerral Meth, MD      Allergies    Atorvastatin calcium , Gemfibrozil, and Insulin  glargine    Review of Systems   Review of Systems Review of systems completed and notable as per HPI.  ROS otherwise negative.   Physical Exam Updated Vital Signs BP 100/68   Pulse 72   Temp 97.8 F (36.6 C) (Oral)   Resp 20   Wt 113 kg   SpO2 94%   BMI 33.79 kg/m  Physical Exam Vitals and nursing note reviewed.  Constitutional:      General: He is not in acute distress.    Appearance: He is well-developed.  HENT:     Head: Normocephalic and atraumatic.  Eyes:     Conjunctiva/sclera: Conjunctivae normal.  Cardiovascular:     Rate and Rhythm: Normal rate and regular rhythm.     Pulses: Normal pulses.     Heart sounds: Normal heart sounds. No murmur heard. Pulmonary:     Effort: Pulmonary effort is normal. No respiratory distress.     Breath sounds: Normal breath sounds.  Abdominal:     Palpations: Abdomen is soft.     Tenderness: There is no abdominal tenderness. There is no guarding or rebound.  Musculoskeletal:  General: No swelling.     Cervical back: Neck supple.  Skin:    General: Skin is warm and dry.     Capillary Refill: Capillary refill takes less than 2 seconds.  Neurological:     General: No focal deficit present.     Mental Status: He is alert.  Psychiatric:        Mood and Affect: Mood normal. Affect is labile.        Speech: Speech is rapid and pressured.     Comments: Avoidant during questioning.  He has labile affect, denies SI or HI or plan.     ED Results / Procedures / Treatments   Labs (all labs ordered are listed, but only abnormal results are displayed) Labs Reviewed  ETHANOL - Abnormal; Notable for the following components:      Result Value   Alcohol, Ethyl (B) 128 (*)    All other components  within normal limits  SALICYLATE LEVEL - Abnormal; Notable for the following components:   Salicylate Lvl <7.0 (*)    All other components within normal limits  ACETAMINOPHEN  LEVEL - Abnormal; Notable for the following components:   Acetaminophen  (Tylenol ), Serum <10 (*)    All other components within normal limits  CBC - Abnormal; Notable for the following components:   Platelets 141 (*)    All other components within normal limits  RAPID URINE DRUG SCREEN, HOSP PERFORMED - Abnormal; Notable for the following components:   Cocaine POSITIVE (*)    Amphetamines POSITIVE (*)    All other components within normal limits  COMPREHENSIVE METABOLIC PANEL - Abnormal; Notable for the following components:   CO2 21 (*)    Glucose, Bld 167 (*)    BUN <5 (*)    Total Bilirubin 2.3 (*)    All other components within normal limits  URINALYSIS, COMPLETE (UACMP) WITH MICROSCOPIC    EKG None  Radiology No results found.  Procedures Procedures    Medications Ordered in ED Medications  QUEtiapine  (SEROQUEL ) tablet 300 mg (has no administration in time range)  traZODone  (DESYREL ) tablet 50 mg (has no administration in time range)  gabapentin  (NEURONTIN ) capsule 300 mg (300 mg Oral Patient Refused/Not Given 09/05/23 1525)  LORazepam  (ATIVAN ) tablet 1 mg (1 mg Oral Given 09/05/23 0919)  haloperidol  lactate (HALDOL ) injection 5 mg (5 mg Intramuscular Given 09/05/23 1046)    ED Course/ Medical Decision Making/ A&P Clinical Course as of 09/05/23 1655  Tue Sep 05, 2023  1041 Patient became verbally agitated.  Multiple people tried to de-escalate verbally including myself.  He threatened to cut staff with a blade.  Haldol  was ordered for acute agitation. [JD]    Clinical Course User Index [JD] Nicholaus [name], MD                                 Medical Decision Making Amount and/or Complexity of Data Reviewed Labs: ordered.  Risk Prescription drug management.   Medical Decision Making:    Cory Palmer is a 63 y.o. male who presented to the ED today with IVC.  Vital signs reviewed.  On my exam patient is somewhat labile.  He is slightly agitated but verbally redirectable.  Patient denies SI or HI or AVH here but he is somewhat pressured with his speech and tangential.  He has history of bipolar disorder is not taking his medications.  I reviewed the IVC paperwork.  Police who found the IVC reported that he made multiple statements threatening to harm himself and others with a gun.  He denies this, however IVC is clear that he made multiple statements regarding suicidality and homicidality.  I am concerned he is minimizing his symptoms and he is quite avoidant during questioning.  That given this I am concerned he is a potential threat to himself and others I do not think he is safe for discharge.  I discussed with him that he would be evaluated by psychiatry and I completed the first exam form for IVC.  Reviewed and confirmed nursing documentation for past medical history, family history, social history.  Reassessment and Plan:   Patient became verbally agitated and threatened to cut staff with a blade.  He required treatment for agitation with Haldol .  Psychiatry is recommending inpatient placement.  He is awaiting inpatient psychiatric treatment.   Patient's presentation is most consistent with acute complicated illness / injury requiring diagnostic workup.           Final Clinical Impression(s) / ED Diagnoses Final diagnoses:  Suicidal ideation    Rx / DC Orders ED Discharge Orders     None         Nicholaus [name], MD 09/05/23 1655

## 2023-09-05 NOTE — ED Triage Notes (Signed)
 Pt is here under IVC from an apartment for repeatedly calling 911, stating he felt homicidal, that he had a gun, states in police presence that he would kill everyone', had a knif in his pocket and stated to [employer] he would kill the officers. Denies any pain or injury.  Pt tells this RN that there are drug dealers banging on my windows with guns  Prescribed several psych medication, not taking these. Pt endorses this too.  EMS: 106CBG, 168SBP, 100HR  Pt speech is pressured in interview. Denies SI/HI to this RN.

## 2023-09-05 NOTE — Consult Note (Signed)
 [facility] Psychiatric Consult Initial  Patient Name: .Cory Palmer  MRN: [id number]  DOB: [birth year]  Consult Order details:  Orders (From admission, onward)     Start     Ordered   09/05/23 0848  CONSULT TO CALL ACT TEAM       Ordering Provider: Nicholaus [name], MD  Provider:  (Not yet assigned)  Question:  Reason for Consult?  Answer:  SI   09/05/23 0847             Mode of Visit: In person    Psychiatry Consult Evaluation  Service Date: September 05, 2023 LOS:  LOS: 0 days  Chief Complaint I'm ready to go home  Primary Psychiatric Diagnoses  Cocaine induced mood disorder 2.   3.    Assessment  Cory Palmer is a 63 y.o. male admitted: Presented to the EDfor 09/05/2023  8:05 AM for threats to kill his neighbor and himself. He carries the psychiatric diagnoses of bipolar disorder, cocaine use, an cocaine induced mood disorder.  His current presentation of agitation/threats is most consistent with cocaine induced mood disorder. He meets criteria for inpatient based on his homicidal and suicidal threats made to police today.  Current outpatient psychotropic medications include seroquel  and trazodone  and historically he has had a positive response to these medications. He was not compliant with medications prior to admission as evidenced by patient report, has been off medications for around 3 months and wanting to restart them. On initial examination, patient is cooperative, wanting to discharge. Please see plan below for detailed recommendations.   Diagnoses:  Active Hospital problems: Principal Problem:   Cocaine abuse with cocaine-induced mood disorder (HCC)    Plan   ## Psychiatric Medication Recommendations:  - Seroquel  300 mg at bedtime - Gabapentin  300 mg TID - Trazodone  50 mg Qhs  ## Medical Decision Making Capacity: Not specifically addressed in this encounter  ## Further Work-up:  EKG pending UDS positive for COC and AMP -- Pertinent labwork  reviewed earlier this admission includes: CBC, CMP   ## Disposition:-- We recommend inpatient psychiatric hospitalization after medical hospitalization. Patient has been involuntarily committed on 09/05/23.   ## Behavioral / Environmental: - No specific recommendations at this time.     ## Safety and Observation Level:  - Based on my clinical evaluation, I estimate the patient to be at low risk of self harm in the current setting. - At this time, we recommend  routine. This decision is based on my review of the chart including patient's history and current presentation, interview of the patient, mental status examination, and consideration of suicide risk including evaluating suicidal ideation, plan, intent, suicidal or self-harm behaviors, risk factors, and protective factors. This judgment is based on our ability to directly address suicide risk, implement suicide prevention strategies, and develop a safety plan while the patient is in the clinical setting. Please contact our team if there is a concern that risk level has changed.  CSSR Risk Category:   Suicide Risk Assessment: Patient has following modifiable risk factors for suicide: recklessness and medication noncompliance, which we are addressing by inpatient admission, restarting medications. Patient has following non-modifiable or demographic risk factors for suicide: male gender Patient has the following protective factors against suicide: Access to outpatient mental health care and Cultural, spiritual, or religious beliefs that discourage suicide  Thank you for this consult request. Recommendations have been communicated to the primary team.  We will recommend for inpatient admission at this time  for further stabilization and to restart psychotropic medications. However, per patient's substance induced mood disorder history, he has been known to have rapid improvements. Psychiatry will continue to assess the need for IP daily, as patient  could be considered for discharged if he continues to stabilize/improve. 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   Nash Batter, NP       History of Present Illness  Relevant Aspects of Hospital ED Course:  63 year old male with history of depression, bipolar disorder, diabetes, hypertension presenting for IVC. Patient states last night he felt like there were drug dealers out to get him. Police were called to his apartment this morning. They placed IVC. The IVC reports that the patient made multiple statements stating that he was suicidal and homicidal. He stated reportedly that he would obtain a gun, and would kill everyone including himself. Reportedly had a knife at the time as well. He was brought here for further evaluation under IVC by police. Patient denies all of this. He acknowledges that police were there this morning but denies ever stating he was going to harm themselves or others. He denies SI or HI or hallucinations. He states he supposed be taking medications for bipolar disorder but is not taking them right now.   Patient Report:  Pt seen at [facility] for face to face psychiatric evaluation. Upon assessment patient is laying bed, he is pleasant and cooperative with this provider. This provider has evaluated patient before, most recently in November when patient was homeless and threatening suicide. Today, pt informs me he was able to get into low income housing and has been in his apartment for almost 3 months. Pt states he does not have any furniture, but his case worker is trying to gather some donations for him. Pt does admit any leftover money he has from his check after rent he will spend it on cocaine, and a little on food. He admits to not taking care of himself. Pt is very honest with this provider. States he has no intentions of discontinuing his cocaine use. Pt states I've been using off and on since I was a teenager, I'm probably not going  to stop.   Pt denies any suicidal ideations. Denies homicidal ideations. Denies auditory or visual hallucinations. Reports good appetite and okay sleep. He is able to engage in coherent conversation, does not appear to be RTIS during assessment. He does express some paranoia, states he did have drug dealers come to his house the other day and demand 300 bucks. Pt states he doesn't owe them money but is worried that they could try and harm him. After discussing drug dealer threats further, patient did not seem overly distressed, and appears his concerns with these drug dealers could be true considering his frequent drug use. His information regarding his paranoia and concerns did not seem unrealistic.   In regards to the information provided by police on his IVC, it appears he told the police he was going to kill his neighbor, kill the police, and then kill himself. When I asked the patient about this, he stated he never said any of those things. He denied everything from the petition. Pt stated maybe I don't remember saying it because I was high but I didn't say those things. Pt denies access to weapons or firearms. He does admit to having a knife on him at the time police were there, states he always keeps a knife on him.  Ultimately, patient is requesting to discharge back to his apartment. However, he does want to restart his psychiatric medications. He has been off of his medications for around 3 months due to unable to get OP prescription and transportation barriers. Pt is wanting to restart Gabapentin , Seroquel , and Trazodone . Explained to patient due to severity of threats on IVC paperwork from police,positive Cocaine and amphetamines in system, as well as wanting to restart psychotropic medications, will recommend IP tx for further stabilization.    Psychiatry will continue to follow up daily to assess progress, improvements, and need for IP treatment. If patient continues to remain  cooperative, pleasant, denies SI/HI/AVH, and compliant with medications can consider discharge possibly tomorrow. However, he is recommended to remain under IVC at this time.   Psych ROS:  Depression: hx yes Anxiety:  hx yes Mania (lifetime and current): hx yes Psychosis: (lifetime and current): denies   Review of Systems  Psychiatric/Behavioral:  Positive for substance abuse.      Psychiatric and Social History  Psychiatric History:  Information collected from patient, chart  Prev Dx/Sx: bipolar disorder, substance induced mood disorder Current Psych Provider: none Home Meds (current): noncompliant Previous Med Trials: seroquel , gabapentin  Therapy: denies   Social History:  Developmental Hx: wdl Educational Hx: high school Occupational Hx: disabled Legal Hx: denies current Living Situation: low income housing apartment Spiritual Hx: yes Access to weapons/lethal means: denies firearms (does have a knife)   Substance History Alcohol: occasional beer a few times a week  Tobacco: yes Illicit drugs: Cocaine Prescription drug abuse: denies Rehab hx: yes  Exam Findings  Physical Exam:  Vital Signs:  Temp:  [97.8 F (36.6 C)-98.3 F (36.8 C)] 97.8 F (36.6 C) (02/04 1152) Pulse Rate:  [72-87] 72 (02/04 1152) Resp:  [20] 20 (02/04 1152) BP: (100-140)/(68-90) 100/68 (02/04 1152) SpO2:  [94 %-97 %] 94 % (02/04 1152) Weight:  [886 kg] 113 kg (02/04 0818) Blood pressure 100/68, pulse 72, temperature 97.8 F (36.6 C), temperature source Oral, resp. rate 20, weight 113 kg, SpO2 94%. Body mass index is 33.79 kg/m.  Physical Exam Vitals and nursing note reviewed.  Neurological:     Mental Status: He is alert and oriented to person, place, and time.     Mental Status Exam: General Appearance: Fairly Groomed  Orientation:  Full (Time, Place, and Person)  Memory:  Immediate;   Fair Recent;   Fair  Concentration:  Concentration: Good  Recall:  Good  Attention  Fair   Eye Contact:  Good  Speech:  Clear and Coherent  Language:  Good  Volume:  Normal  Mood: great  Affect:  Congruent  Thought Process:  Coherent and Goal Directed  Thought Content:  WDL  Suicidal Thoughts:  No  Homicidal Thoughts:  No  Judgement:  Fair  Insight:  Fair  Psychomotor Activity:  Restlessness  Akathisia:  No  Fund of Knowledge:  Fair      Assets:  Communication Skills Desire for Improvement Housing Leisure Time Social Support  Cognition:  WNL  ADL's:  Intact  AIMS (if indicated):        Other History   These have been pulled in through the EMR, reviewed, and updated if appropriate.  Family History:  The patient's family history includes Diabetes Mellitus II in an other family member; Hypertension in an other family member.  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    Surgical History: Past Surgical History:  Procedure Laterality Date   AMPUTATION Left 09/28/2018   Procedure: LEFT TRANSMETATARSAL AMPUTATION;  Surgeon: Harden Jerona GAILS, MD;  Location[facility] OR;  Service: Orthopedics;  Laterality: Left;   AMPUTATION Left 10/23/2019   Procedure: AMPUTATION BELOW KNEE;  Surgeon: Harden Jerona GAILS, MD;  Location: [facility] OR;  Service: Orthopedics;  Laterality: Left;   BELOW KNEE LEG AMPUTATION Right    ESOPHAGOGASTRODUODENOSCOPY (EGD) WITH PROPOFOL  N/A 10/19/2020   Procedure: ESOPHAGOGASTRODUODENOSCOPY (EGD) WITH PROPOFOL ;  Surgeon: Kristie Lamprey, MD;  Location: [facility] ENDOSCOPY;  Service: Endoscopy;  Laterality: N/A;   TEE WITHOUT CARDIOVERSION N/A 10/25/2019   Procedure: TRANSESOPHAGEAL ECHOCARDIOGRAM (TEE);  Surgeon: Pietro Redell RAMAN, MD;  Location: [facility] ENDOSCOPY;  Service: Cardiovascular;  Laterality: N/A;     Medications:  No current facility-administered medications for this encounter.  Current Outpatient Medications:    apixaban  (ELIQUIS ) 5 MG TABS tablet, Take 1 tablet (5 mg total) by mouth 2 (two) times daily. (Patient not taking: Reported on 08/03/2023), Disp: 60 tablet, Rfl: 0   APIXABAN  (ELIQUIS ) VTE STARTER PACK (10MG  AND 5MG ), Take as directed on package: start with two-5mg  tablets twice daily for 7 days. On day 8, switch to one-5mg  tablet twice daily., Disp: 74 each, Rfl: 0   gabapentin  (NEURONTIN ) 300 MG capsule, Take 300 mg by mouth 3 (three) times daily., Disp: , Rfl:    metFORMIN  (GLUCOPHAGE ) 500 MG tablet, Take 1 tablet (500 mg total) by mouth 2 (two) times daily with a meal., Disp: 60 tablet, Rfl: 0   QUEtiapine  (SEROQUEL ) 300 MG tablet, Take 1 tablet (300 mg total) by mouth 2 (two) times daily., Disp: 60 tablet, Rfl: 0   traZODone  (DESYREL ) 50 MG tablet, Take 1 tablet (50 mg total) by mouth at bedtime as needed for sleep., Disp: 30 tablet, Rfl: 0  Allergies: Allergies  Allergen Reactions   Atorvastatin Calcium  Nausea And Vomiting   Gemfibrozil     Unknown   Insulin  Glargine     Unknown    Nash Batter, NP

## 2023-09-05 NOTE — ED Notes (Signed)
Lab called, reported that light green top was hemolyzed.

## 2023-09-05 NOTE — ED Notes (Addendum)
Pt initially declines blood and urine sample, now allows RN to gather. He maintains that he is refusing to wear scrubs. Phone and sum of money >$100 in security office safe.

## 2023-09-05 NOTE — ED Notes (Signed)
Patient came In already IVC First exam completed by provider, Paperwork has been efiled envelope #[id number] Case number [id number] 3 copies attached to clipboard in orange zone, 1 in medical records and original in red folder EXP 2/11

## 2023-09-05 NOTE — ED Notes (Addendum)
 Pt tries to leave ER. Escorted back to ER and explained that he is IVC. Pt began screaming and calling staff unpleasant names. Pt agrees to take oral medication, see MAR but declines EKG, changing to scrubs. Pt given turkey sandwich, which redirects him from yelling at this time.   He would like staff to know that he was homicidal towards the people, the drug dealers trying to kill me at my house!

## 2023-09-05 NOTE — ED Notes (Signed)
Pt refuses stretcher, assisted to chair. He is able to transfer himself from [facility] to recliner with SBA.

## 2023-09-05 NOTE — ED Notes (Signed)
Light green top recollected at this time

## 2023-09-05 NOTE — ED Provider Triage Note (Signed)
 Emergency Medicine Provider Triage Evaluation Note  Cory Palmer , a 63 y.o. male  was evaluated in triage.  Pt here for evaluation of IVC for suicidal ideation.  Review of Systems  Positive: IVC notable for reports of SI, HI with plan to shoot himself and others Negative: Here patient denies SI, HI, AVH  Physical Exam  BP (!) 140/90 (BP Location: Right Arm)   Pulse 87   Temp 98.3 F (36.8 C) (Oral)   Resp 20   Wt 113 kg   SpO2 97%   BMI 33.79 kg/m  Gen:   Awake, slightly agitated but redirectable Resp:  Normal effort  MSK:   Moves extremities without difficulty  Other:  Slightly pressured and tangential speech.  Does not appear to be responding to internal stimuli.  Medical Decision Making  Medically screening exam initiated at 9:14 AM.  Appropriate orders placed.  Cory Palmer was informed that the remainder of the evaluation will be completed by another provider, this initial triage assessment does not replace that evaluation, and the importance of remaining in the ED until their evaluation is complete.  Patient is here under IVC.  Please reportedly called to his apartment.  There per the IVC patient stated he was suicidal and would get a gun to shoot himself and the police.  Reportedly had a knife as well.  Patient denies any is here but is avoidant and somewhat tangential throughout the interview.  Denies any recent drug use.  I am concerned he is a potential threat to himself and others given statements he made to the police with plan to hurt himself and others.  Given this I do not think he is safe at this time, first exam for IVC completed and I discussed with the patient the need for further evaluation by psychiatry.   Cory [name], MD 09/05/23 [phone]

## 2023-09-06 ENCOUNTER — Encounter (HOSPITAL_COMMUNITY): Payer: Self-pay | Admitting: Psychiatry

## 2023-09-06 ENCOUNTER — Other Ambulatory Visit (HOSPITAL_COMMUNITY): Payer: Self-pay

## 2023-09-06 DIAGNOSIS — F1414 Cocaine abuse with cocaine-induced mood disorder: Secondary | ICD-10-CM

## 2023-09-06 MED ORDER — TRAZODONE HCL 50 MG PO TABS
50.0000 mg | ORAL_TABLET | Freq: Every day | ORAL | 0 refills | Status: DC
  Filled 2023-09-06: qty 7, 7d supply, fill #0

## 2023-09-06 MED ORDER — QUETIAPINE FUMARATE 300 MG PO TABS
300.0000 mg | ORAL_TABLET | Freq: Every day | ORAL | 0 refills | Status: DC
  Filled 2023-09-06: qty 7, 7d supply, fill #0

## 2023-09-06 NOTE — Progress Notes (Signed)
 CSW spoke with interdisplinary team regarding patient's need for medications and a ride home. Medications were sent to [facility] pharmacy to be filled prior to discharge. CSW agreeable for patient to receive a cab voucher for transportation home.  Niels Portugal, MSW, LCSW Transitions of Care  Clinical Social Worker II [phone]

## 2023-09-06 NOTE — ED Notes (Signed)
 Rescintion complete and has been filed envelope #[id number]

## 2023-09-06 NOTE — ED Notes (Signed)
 Pt made staff aware he does not want to take gabapentin  d/t side effects. TOC team aware. Plan for [facility] pharm consult to provide patient with meds tprior to DC to help bridge to home.

## 2023-09-06 NOTE — Discharge Instructions (Addendum)
 Continue current medication regimen Please perform close outpatient follow-up with psychiatry Please perform close outpatient follow-up with primary care Please strictly adhere to safety plan listed below Please consider resources for substance abuse provided   Safety Plan Cory Palmer will reach out to brother, call 911 or call mobile crisis, or go to nearest emergency room if condition worsens or if suicidal thoughts become active Patients' will follow up with [facility] for outpatient psychiatric services (therapy/medication management).  The suicide prevention education provided includes the following: Suicide risk factors Suicide prevention and interventions National Suicide Hotline telephone number [facility] assessment telephone number [facility] Emergency Assistance 911 [facility] and/or Residential Mobile Crisis Unit telephone number

## 2023-09-06 NOTE — ED Provider Notes (Signed)
 Emergency Medicine Observation Re-evaluation Note  Cory Palmer is a 63 y.o. male, seen on rounds today.  Pt initially presented to the ED for complaints of IVC Currently, the patient is calm and cooperative.  Physical Exam  BP (!) 111/53 (BP Location: Left Arm)   Pulse 62   Temp 98.2 F (36.8 C) (Oral)   Resp 18   Wt 113 kg   SpO2 99%   BMI 33.79 kg/m  Physical Exam General: Awake. Alert. No acute distress Cardiac: Regular rate rhythm Lungs: Clear to auscultation bilaterally Psych: Calm and cooperative  ED Course / MDM  EKG:EKG Interpretation Date/Time:  Tuesday September 05 2023 11:48:38 EST Ventricular Rate:  82 PR Interval:    QRS Duration:  98 QT Interval:  436 QTC Calculation: 509 R Axis:   -5  Text Interpretation: Atrial fibrillation Prolonged QT Abnormal ECG When compared with ECG of 03-Aug-2023 12:12, No significant change was found Confirmed by Raford Lenis ([id number]) on 09/05/2023 11:30:07 PM  I have reviewed the labs performed to date as well as medications administered while in observation.  Recent changes in the last 24 hours include : Evaluation completed by psychiatric team with details as follows   #Cocaine abuse with cocaine-induced mood disorder (HCC) -Continue current medication regimen listed below - Seroquel  300 mg at bedtime - Gabapentin  300 mg TID - Trazodone  50 mg at bedtime -Recommend consider discharging with bridge of the above medications so patient has time to get into outpatient follow-up -Recommend close outpatient follow-up with psychiatry -Recommend close outpatient follow-up with primary care -Recommend strict adherence to safety plan listed below -Recommend resources for substance abuse provided Safety Plan 1. DAMONEY JULIA will reach out to brother, call 911 or call mobile crisis, or go to nearest emergency room if condition worsens or if suicidal thoughts become active 2. Patients' will follow up with [facility] for outpatient psychiatric services  (therapy/medication management). 3. The suicide prevention education provided includes the following:  Suicide risk factors  Suicide prevention and interventions  National Suicide Hotline telephone number  [facility] assessment telephone number  [facility] Emergency Assistance 911  [facility] and/or Residential Mobile Crisis Unit telephone number ## Medical Decision Making Capacity: Has capacity ## Further Work-up: None at this time ## Disposition:-- There are no psychiatric contraindications to discharge at this time ## Behavioral / Environmental: -Routine until discharge   Plan  Current plan is for discharge with arrangements for outpatient follow-up per psychiatric team.  Patient stable for discharge at this time 1120    Pamella Ozell LABOR, DO 09/06/23 1119

## 2023-09-06 NOTE — Consult Note (Addendum)
 [facility] Consult Follow-up  Patient Name: .Cory Palmer  MRN: [id number]  DOB: [birth year]  Consult Order details:  Orders (From admission, onward)     Start     Ordered   09/05/23 0848  CONSULT TO CALL ACT TEAM       Ordering Provider: Nicholaus [name], MD  Provider:  (Not yet assigned)  Question:  Reason for Consult?  Answer:  SI   09/05/23 0847             Mode of Visit: In person    Psychiatry Consult Evaluation  Service Date: September 06, 2023 LOS:  LOS: 0 days  Chief Complaint: I'm ready to go home    Primary Psychiatric Diagnoses  Cocaine induced mood disorder  Assessment   CHEICK SUHR is a 63 y.o. male admitted: Presented to the EDfor 09/05/2023  8:05 AM for threats to kill his neighbor and himself. He carries the psychiatric diagnoses of bipolar disorder, cocaine use, an cocaine induced mood disorder.   Upon reevaluation, patient presents with no clinical symptomology of current intoxication from illicit substance use, continues to deny suicidal and/or homicidal ideations, denies auditory and/or visual hallucinations, does not present with concerns for decompensation into psychosis and/or of lacking capacity, thus the recommendation is for safety plan to be put in place for safe discharge, recommendation for close outpatient follow-up with psychiatry and primary care, as well as the additional recommendations listed below.  Spoke with Dr. Zouev who agrees with recommendations listed below.  Diagnoses:  Active Hospital problems: Principal Problem:   Cocaine abuse with cocaine-induced mood disorder (HCC)    Plan   ## Psychiatric Recommendations:   #Cocaine abuse with cocaine-induced mood disorder (HCC)  -Continue current medication regimen listed below - Seroquel  300 mg at bedtime - Gabapentin  300 mg TID - Trazodone  50 mg at bedtime -Recommend consider discharging with bridge of the above medications so patient has time to get into  outpatient follow-up -Recommend close outpatient follow-up with psychiatry -Recommend close outpatient follow-up with primary care -Recommend strict adherence to safety plan listed below -Recommend resources for substance abuse provided   Safety Plan AMMIEL GUINEY will reach out to brother, call 911 or call mobile crisis, or go to nearest emergency room if condition worsens or if suicidal thoughts become active Patients' will follow up with [facility] for outpatient psychiatric services (therapy/medication management).  The suicide prevention education provided includes the following: Suicide risk factors Suicide prevention and interventions National Suicide Hotline telephone number [facility] assessment telephone number [facility] Emergency Assistance 911 [facility] and/or Residential Mobile Crisis Unit telephone number  ## Medical Decision Making Capacity: Has capacity  ## Further Work-up: None at this time  ## Disposition:-- There are no psychiatric contraindications to discharge at this time  ## Behavioral / Environmental: -Routine until discharge    ## Safety and Observation Level:  - Based on my clinical evaluation, I estimate the patient to be at low risk of self harm in the current setting and upon recommendation for discharge. - At this time, we recommend  1:1 Observation until discharge. This decision is based on my review of the chart including patient's history and current presentation, interview of the patient, mental status examination, and consideration of suicide risk including evaluating suicidal ideation, plan, intent, suicidal or self-harm behaviors, risk factors, and protective factors. This judgment is based on our ability to directly address suicide risk, implement suicide prevention strategies, and develop a safety plan  while the patient is in the clinical setting. Please contact our team if there is a concern that risk level has changed.  CSSR  Risk Category:   Suicide Risk Assessment: Patient has following modifiable risk factors for suicide: social isolation and medication noncompliance, which we are addressing by treatment recommendations. Patient has following non-modifiable or demographic risk factors for suicide: male gender Patient has the following protective factors against suicide: Access to outpatient mental health care and Cultural, spiritual, or religious beliefs that discourage suicide  Thank you for this consult request. Recommendations have been communicated to the primary team.  We will psychiatrically clear the patient at this time.   Jerel JINNY Gravely, NP       History of Present Illness   Per Reports  63 year old male with history of depression, bipolar disorder, diabetes, hypertension presenting for IVC. Patient states last night he felt like there were drug dealers out to get him. Police were called to his apartment this morning. They placed IVC. The IVC reports that the patient made multiple statements stating that he was suicidal and homicidal. He stated reportedly that he would obtain a gun, and would kill everyone including himself. Reportedly had a knife at the time as well. He was brought here for further evaluation under IVC by police. Patient denies all of this. He acknowledges that police were there this morning but denies ever stating he was going to harm themselves or others. He denies SI or HI or hallucinations. He states he supposed be taking medications for bipolar disorder but is not taking them right now.   Patient seen today at the [facility] emergency department for a psychiatric re-evaluation.  Upon reevaluation, patient endorses he slept fairly well over the night, though it was difficult sleeping in the hallway of the emergency department. Patient endorses since last use of illicit substances of cocaine and wine, states he is no longer feeling intoxicated, and upon physical exam, shows no signs  of withdrawal and/or continued intoxication.  Patient orientation is intact, no concerns for fluctuations in consciousness.  Patient endorses no suicidal and/or homicidal ideations, denies any concerns with returning back to his residence and maintaining his safety, both for himself and others. Patient endorses no problems with restarting his medications, states that they are well-tolerated, also that he finds them helpful.  Patient endorses no auditory or visual hallucinations, and objectively, does not appear to present with lacking of capacity and/or psychosis. Patient endorses upon discharge he can adhere to recommendation for close outpatient follow-up with primary care and his outpatient psychiatry provider team at [facility].  Patient endorses he continues to not have a desire to address his substance use, but is amenable to resources being given upon discharge.  Per chart review and nursing:  No behavioral events over the night.  No safety concerns reported.  Patient has been medication compliant.  Review of Systems  Neurological:  Negative for tremors and seizures.  Psychiatric/Behavioral:  Positive for substance abuse (Cocaine and wine). Negative for depression, hallucinations and suicidal ideas. The patient is not nervous/anxious and does not have insomnia.   All other systems reviewed and are negative.   Psychiatric and Social History  Psychiatric History:   Information collected from patient, chart   Prev Dx/Sx: bipolar disorder, substance induced mood disorder Current Psych Provider: none Home Meds (current): noncompliant Previous Med Trials: seroquel , gabapentin  Therapy: denies     Social History:  Developmental Hx: wdl Educational Hx: high school Occupational Hx: disabled Legal Hx:  denies current Living Situation: low income housing apartment Spiritual Hx: yes Access to weapons/lethal means: denies firearms (does have a knife)    Substance History Alcohol: occasional  beer a few times a week  Tobacco: yes Illicit drugs: Cocaine Prescription drug abuse: denies Rehab hx: yes  Exam Findings  Physical Exam: As below Vital Signs:  Temp:  [97.8 F (36.6 C)-98.4 F (36.9 C)] 98.1 F (36.7 C) (02/05 0609) Pulse Rate:  [69-87] 71 (02/05 0609) Resp:  [18-20] 18 (02/05 0609) BP: (82-140)/(55-90) 100/55 (02/05 0609) SpO2:  [94 %-100 %] 96 % (02/05 0609) Weight:  [886 kg] 113 kg (02/04 0818) Blood pressure (!) 100/55, pulse 71, temperature 98.1 F (36.7 C), temperature source Oral, resp. rate 18, weight 113 kg, SpO2 96%. Body mass index is 33.79 kg/m.  Physical Exam Vitals and nursing note reviewed.  Constitutional:      General: He is not in acute distress.    Appearance: He is obese. He is not ill-appearing, toxic-appearing or diaphoretic.  Pulmonary:     Effort: Pulmonary effort is normal.  Skin:    General: Skin is warm and dry.  Neurological:     Mental Status: He is alert and oriented to person, place, and time.  Psychiatric:        Attention and Perception: Attention and perception normal. He does not perceive auditory or visual hallucinations.        Mood and Affect: Mood and affect normal.        Speech: Speech is slurred (Baseline (d/t missing teeth)).        Behavior: Behavior is not agitated, slowed, aggressive, withdrawn, hyperactive or combative. Behavior is cooperative.        Thought Content: Thought content normal. Thought content is not paranoid. Thought content does not include homicidal or suicidal ideation.        Cognition and Memory: Cognition and memory normal.        Judgment: Judgment normal.   Mental Status Exam: General Appearance: Disheveled  Orientation:  Full (Time, Place, and Person)  Memory:   Within defined limits  Concentration:  Concentration: Fair and Attention Span: Fair  Recall:  Fair  Attention  Fair  Eye Contact:  Fair  Speech:  Garbled (d/t no teeth)  Language:   Below average  Volume:  Normal   Mood: Euthymic  Affect:   Neutral  Thought Process:  Coherent, Goal Directed, and Linear  Thought Content:  Logical  Suicidal Thoughts:  No  Homicidal Thoughts:  No  Judgement:  Intact  Insight:  Present  Psychomotor Activity:  Normal  Akathisia:  No  Fund of Knowledge:  Fair      Assets:  Manufacturing Systems Engineer Desire for Improvement Financial Resources/Insurance Housing Resilience Social Support  Cognition:  WNL  ADL's:  Impaired (bilateral leg amputation)  AIMS (if indicated):   0     Other History   These have been pulled in through the EMR, reviewed, and updated if appropriate.  Family History:  The patient's family history includes Diabetes Mellitus II in an other family member; Hypertension in an other family member.  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    Surgical History: Past Surgical History:  Procedure Laterality Date   AMPUTATION Left 09/28/2018   Procedure: LEFT TRANSMETATARSAL AMPUTATION;  Surgeon: Harden Jerona GAILS, MD;  Location: [facility] OR;  Service: Orthopedics;  Laterality: Left;   AMPUTATION Left 10/23/2019   Procedure: AMPUTATION BELOW KNEE;  Surgeon: Harden Jerona GAILS, MD;  [facility] OR;  Service: Orthopedics;  Laterality: Left;   BELOW KNEE LEG AMPUTATION Right    ESOPHAGOGASTRODUODENOSCOPY (EGD) WITH PROPOFOL  N/A 10/19/2020   Procedure: ESOPHAGOGASTRODUODENOSCOPY (EGD) WITH PROPOFOL ;  Surgeon: Kristie Lamprey, MD;  Location: [facility] ENDOSCOPY;  Service: Endoscopy;  Laterality: N/A;   TEE WITHOUT CARDIOVERSION N/A 10/25/2019   Procedure: TRANSESOPHAGEAL ECHOCARDIOGRAM (TEE);  Surgeon: Pietro Redell RAMAN, MD;   Location: [facility] ENDOSCOPY;  Service: Cardiovascular;  Laterality: N/A;     Medications:   Current Facility-Administered Medications:    gabapentin  (NEURONTIN ) capsule 300 mg, 300 mg, Oral, TID, Mardy Legacy, NP   QUEtiapine  (SEROQUEL ) tablet 300 mg, 300 mg, Oral, QHS, Coleman, Legacy, NP, 300 mg at 09/05/23 2213   traZODone  (DESYREL ) tablet 50 mg, 50 mg, Oral, QHS PRN, Mardy Legacy, NP, 50 mg at 09/05/23 2218  Current Outpatient Medications:    apixaban  (ELIQUIS ) 5 MG TABS tablet, Take 1 tablet (5 mg total) by mouth 2 (two) times daily. (Patient not taking: Reported on 08/03/2023), Disp: 60 tablet, Rfl: 0   APIXABAN  (ELIQUIS ) VTE STARTER PACK (10MG  AND 5MG ), Take as directed on package: start with two-5mg  tablets twice daily for 7 days. On day 8, switch to one-5mg  tablet twice daily., Disp: 74 each, Rfl: 0   gabapentin  (NEURONTIN ) 300 MG capsule, Take 300 mg by mouth 3 (three) times daily., Disp: , Rfl:    metFORMIN  (GLUCOPHAGE ) 500 MG tablet, Take 1 tablet (500 mg total) by mouth 2 (two) times daily with a meal., Disp: 60 tablet, Rfl: 0   QUEtiapine  (SEROQUEL ) 300 MG tablet, Take 1 tablet (300 mg total) by mouth 2 (two) times daily., Disp: 60 tablet, Rfl: 0   traZODone  (DESYREL ) 50 MG tablet, Take 1 tablet (50 mg total) by mouth at bedtime as needed for sleep., Disp: 30 tablet, Rfl: 0  Allergies: Allergies  Allergen Reactions   Atorvastatin Calcium  Nausea And Vomiting   Gemfibrozil     Unknown   Insulin  Glargine     Unknown    Jerel JINNY Gravely, NP

## 2023-09-11 ENCOUNTER — Ambulatory Visit: Payer: 59 | Admitting: Orthopedic Surgery

## 2023-09-25 ENCOUNTER — Ambulatory Visit (INDEPENDENT_AMBULATORY_CARE_PROVIDER_SITE_OTHER): Payer: 59 | Admitting: Orthopedic Surgery

## 2023-09-25 DIAGNOSIS — Z89512 Acquired absence of left leg below knee: Secondary | ICD-10-CM | POA: Diagnosis not present

## 2023-09-25 DIAGNOSIS — Z89511 Acquired absence of right leg below knee: Secondary | ICD-10-CM

## 2023-09-26 ENCOUNTER — Encounter: Payer: Self-pay | Admitting: Orthopedic Surgery

## 2023-09-26 NOTE — Progress Notes (Signed)
 Office Visit Note   Patient: Cory Palmer           Date of Birth: March 18, 1961           MRN: [id number] Visit Date: 09/25/2023              Requested by: Estevan Oaks, NP [address] [place],  [place] [address] PCP: Estevan Oaks, NP  Chief Complaint  Patient presents with   Right Leg - Follow-up   Left Leg - Follow-up      HPI: Patient is a 63 year old gentleman who is a bilateral transtibial amputation.  Patient has had significant decrease in the volume in the residual limbs.  Patient is 4 years status post below-knee amputation.  Assessment & Plan: Visit Diagnoses:  1. S/P bilateral below knee amputation (HCC)     Plan: Patient is provided a prescription for [employer] for new prosthesis for both lower extremities.  Follow-Up Instructions: No follow-ups on file.   Ortho Exam  Patient is alert, oriented, no adenopathy, well-dressed, normal affect, normal respiratory effort. Examination patient has significant decreased in the volume of the residual limbs bilaterally.  There is no ulcers no cellulitis.  Patient is end bearing in his prosthesis.  Patient is an existing bilateral transtibial  amputee.  Patient's current comorbidities are not expected to impact the ability to function with the prescribed prosthesis. Patient verbally communicates a strong desire to use a prosthesis. Patient currently requires mobility aids to ambulate without a prosthesis.  Expects not to use mobility aids with a new prosthesis.  Patient is a K3 level ambulator that spends a lot of time walking around on uneven terrain over obstacles, up and down stairs, and ambulates with a variable cadence.     Imaging: No results found. No images are attached to the encounter.  Labs: Lab Results  Component Value Date   HGBA1C 6.6 12/28/2022   HGBA1C 6.5 (H) 08/26/2022   HGBA1C 6.2 05/16/2022   ESRSEDRATE 10 03/24/2022   ESRSEDRATE 34 (H) 08/15/2019   ESRSEDRATE 31 (H)  08/05/2019   CRP 6.5 (H) 03/24/2022   CRP 15.0 (H) 08/15/2019   CRP 11.0 (H) 08/05/2019   REPTSTATUS 03/29/2022 FINAL 03/24/2022   GRAMSTAIN  11/19/2019    RARE WBC PRESENT, PREDOMINANTLY PMN RARE GRAM POSITIVE COCCI    CULT  03/24/2022    NO GROWTH 5 DAYS Performed at [facility], [address] [place], [place] [address]    LABORGA METHICILLIN RESISTANT STAPHYLOCOCCUS AUREUS 11/19/2019     Lab Results  Component Value Date   ALBUMIN 3.8 09/05/2023   ALBUMIN 3.8 08/03/2023   ALBUMIN 3.1 (L) 06/15/2023   PREALBUMIN 11.2 (L) 05/08/2018    Lab Results  Component Value Date   MG 1.3 (L) 08/03/2023   MG 1.7 06/08/2023   MG 1.8 12/14/2022   No results found for: "VD25OH"  Lab Results  Component Value Date   PREALBUMIN 11.2 (L) 05/08/2018      Latest Ref Rng & Units 09/05/2023    8:45 AM 08/03/2023   12:48 PM 06/15/2023    5:20 PM  CBC EXTENDED  WBC 4.0 - 10.5 K/uL 8.0  4.7    RBC 4.22 - 5.81 MIL/uL 4.65  4.36    Hemoglobin 13.0 - 17.0 g/dL 23.5  57.3  22.0   HCT 39.0 - 52.0 % 44.8  41.4  39.0   Platelets 150 - 400 K/uL 141  97  There is no height or weight on file to calculate BMI.  Orders:  No orders of the defined types were placed in this encounter.  No orders of the defined types were placed in this encounter.    Procedures: No procedures performed  Clinical Data: No additional findings.  ROS:  All other systems negative, except as noted in the HPI. Review of Systems  Objective: Vital Signs: There were no vitals taken for this visit.  Specialty Comments:  No specialty comments available.  PMFS History: Patient Active Problem List   Diagnosis Date Noted   GAD (generalized anxiety disorder) 06/08/2023   Acute pulmonary embolism (HCC) 12/13/2022   Tobacco use 12/13/2022   Substance induced mood disorder (HCC) 11/08/2022   Pneumonia 08/26/2022   Suicide ideation 06/26/2022   Alcohol abuse 04/10/2022   Cocaine abuse with  cocaine-induced mood disorder (HCC) 04/10/2022   Adjustment disorder with mixed anxiety and depressed mood 04/10/2022   Alcohol-induced mood disorder (HCC) 04/10/2022   Severe sepsis (HCC) 03/24/2022   Abdominal pain 03/24/2022   Nausea vomiting and diarrhea 03/24/2022   Prolonged QT interval 03/24/2022   Macrocytosis without anemia 03/24/2022   Sepsis (HCC) 03/24/2022   Acute hepatic encephalopathy (HCC) 11/17/2021   PVD (peripheral vascular disease) (HCC) 11/17/2021   Metabolic acidosis 11/17/2021   Esophagitis 11/17/2021   Acute metabolic encephalopathy 11/11/2021   Thrombocytopenia (HCC) 11/11/2021   Leukocytosis 10/10/2020   GERD (gastroesophageal reflux disease) 10/10/2020   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HCC) 05/06/2020   Hyponatremia 10/23/2019   MRSA bacteremia 10/21/2019   Atrial flutter with rapid ventricular response (HCC) 10/21/2019   Lactic acidosis 10/21/2019   Severe protein-calorie malnutrition (HCC)    Diabetic polyneuropathy associated with type 2 diabetes mellitus (HCC)    Cirrhosis of liver (HCC) 10/19/2019   Anemia 10/19/2019   Medication monitoring encounter 08/15/2019   Serum total bilirubin elevated 10/29/2018   BPH (benign prostatic hyperplasia) 10/29/2018   Below-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)    PAF (paroxysmal atrial fibrillation) (HCC)    Hypokalemia    Hypomagnesemia    Sepsis without acute organ dysfunction (HCC) 10/05/2018   Depressed bipolar disorder (HCC) 10/05/2018   Hypertension 10/05/2018   Hyperbilirubinemia 10/05/2018   Homelessness 05/07/2018   Diabetes mellitus type II, non insulin dependent (HCC) 05/07/2018   Past Medical History:  Diagnosis Date   Anxiety    Depressed bipolar disorder (HCC)    Diabetes mellitus without complication (HCC)    Hypertension    Liver cirrhosis (HCC)    Osteomyelitis of left foot (HCC) 10/19/2019   Osteomyelitis of toe of left foot (HCC)    PAF (paroxysmal atrial  fibrillation) (HCC)    S/P transmetatarsal amputation of foot, 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    Family History  Problem Relation Age of Onset   Hypertension Other    Diabetes Mellitus II Other     Past Surgical History:  Procedure Laterality Date   AMPUTATION Left 09/28/2018   Procedure: LEFT TRANSMETATARSAL AMPUTATION;  Surgeon: Nadara Mustard, MD;  [facility] OR;  Service: Orthopedics;  Laterality: Left;   AMPUTATION Left 10/23/2019   Procedure: AMPUTATION BELOW KNEE;  Surgeon: Nadara Mustard, MD;  Location[facility] OR;  Service: Orthopedics;  Laterality: Left;   BELOW KNEE LEG AMPUTATION Right    ESOPHAGOGASTRODUODENOSCOPY (EGD) WITH PROPOFOL N/A 10/19/2020   Procedure: ESOPHAGOGASTRODUODENOSCOPY (EGD) WITH PROPOFOL;  Surgeon: Charna Elizabeth, MD;  Location: [facility] ENDOSCOPY;  Service: Endoscopy;  Laterality: N/A;   TEE  WITHOUT CARDIOVERSION N/A 10/25/2019   Procedure: TRANSESOPHAGEAL ECHOCARDIOGRAM (TEE);  Surgeon: Lewayne Bunting, MD;  Location: [facility] ENDOSCOPY;  Service: Cardiovascular;  Laterality: N/A;   Social History   Occupational History   Not on file  Tobacco Use   Smoking status: Every Day    Current packs/day: 0.25    Types: Cigarettes    Passive exposure: Current   Smokeless tobacco: Never  Vaping Use   Vaping status: Never Used  Substance and Sexual Activity   Alcohol use: Yes   Drug use: Not Currently    Types: Cocaine    Comment: Last used in July 2023   Sexual activity: Not Currently

## 2023-10-22 ENCOUNTER — Encounter (HOSPITAL_COMMUNITY): Payer: Self-pay | Admitting: Emergency Medicine

## 2023-10-22 ENCOUNTER — Other Ambulatory Visit: Payer: Self-pay

## 2023-10-22 ENCOUNTER — Emergency Department (HOSPITAL_COMMUNITY)
Admission: EM | Admit: 2023-10-22 | Discharge: 2023-10-22 | Disposition: A | Discharge status: Home / Self Care | Attending: Emergency Medicine | Admitting: Emergency Medicine

## 2023-10-22 DIAGNOSIS — R109 Unspecified abdominal pain: Secondary | ICD-10-CM | POA: Diagnosis present

## 2023-10-22 DIAGNOSIS — B370 Candidal stomatitis: Secondary | ICD-10-CM | POA: Insufficient documentation

## 2023-10-22 DIAGNOSIS — G8929 Other chronic pain: Secondary | ICD-10-CM | POA: Diagnosis not present

## 2023-10-22 DIAGNOSIS — T730XXA Starvation, initial encounter: Secondary | ICD-10-CM | POA: Insufficient documentation

## 2023-10-22 LAB — COMPREHENSIVE METABOLIC PANEL
ALT: 20 U/L (ref 0–44)
ALT: 16 U/L (ref 0–44)
AST: 50 U/L — ABNORMAL HIGH (ref 15–41)
AST: 35 U/L (ref 15–41)
Albumin: 4.5 g/dL (ref 3.5–5.0)
Albumin: 4.4 g/dL (ref 3.5–5.0)
Alkaline Phosphatase: 93 U/L (ref 38–126)
Alkaline Phosphatase: 88 U/L (ref 38–126)
Anion gap: 24 — ABNORMAL HIGH (ref 5–15)
Anion gap: 23 — ABNORMAL HIGH (ref 5–15)
BUN: 16 mg/dL (ref 8–23)
BUN: 14 mg/dL (ref 8–23)
CO2: 15 mmol/L — ABNORMAL LOW (ref 22–32)
CO2: 16 mmol/L — ABNORMAL LOW (ref 22–32)
Calcium: 9.4 mg/dL (ref 8.9–10.3)
Calcium: 9.2 mg/dL (ref 8.9–10.3)
Chloride: 100 mmol/L (ref 98–111)
Chloride: 100 mmol/L (ref 98–111)
Creatinine, Ser: 1.22 mg/dL (ref 0.61–1.24)
Creatinine, Ser: 1.25 mg/dL — ABNORMAL HIGH (ref 0.61–1.24)
GFR, Estimated: >60 mL/min (ref >60)
GFR, Estimated: >60 mL/min (ref >60)
Glucose, Bld: 183 mg/dL — ABNORMAL HIGH (ref 70–99)
Glucose, Bld: 209 mg/dL — ABNORMAL HIGH (ref 70–99)
Potassium: 5.6 mmol/L — ABNORMAL HIGH (ref 3.5–5.1)
Potassium: 3.8 mmol/L (ref 3.5–5.1)
Sodium: 139 mmol/L (ref 135–145)
Sodium: 139 mmol/L (ref 135–145)
Total Bilirubin: 2.8 mg/dL — ABNORMAL HIGH (ref 0.0–1.2)
Total Bilirubin: 2.9 mg/dL — ABNORMAL HIGH (ref 0.0–1.2)
Total Protein: 7.7 g/dL (ref 6.5–8.1)
Total Protein: 7.8 g/dL (ref 6.5–8.1)

## 2023-10-22 LAB — CBC
HCT: 47.1 % (ref 39.0–52.0)
Hemoglobin: 16 g/dL (ref 13.0–17.0)
MCH: 34.1 pg — ABNORMAL HIGH (ref 26.0–34.0)
MCHC: 34 g/dL (ref 30.0–36.0)
MCV: 100.4 fL — ABNORMAL HIGH (ref 80.0–100.0)
Platelets: 67 10*3/uL — ABNORMAL LOW (ref 150–400)
RBC: 4.69 MIL/uL (ref 4.22–5.81)
RDW: 12.9 % (ref 11.5–15.5)
WBC: 8.5 10*3/uL (ref 4.0–10.5)
nRBC: 0 % (ref 0.0–0.2)

## 2023-10-22 LAB — LIPASE, BLOOD: Lipase: 27 U/L (ref 11–51)

## 2023-10-22 MED ORDER — SODIUM CHLORIDE 0.9 % IV BOLUS
1000.0000 mL | Freq: Once | INTRAVENOUS | Status: AC
Start: 2023-10-22 — End: 2023-10-22
  Administered 2023-10-22: 1000 mL via INTRAVENOUS

## 2023-10-22 MED ORDER — NYSTATIN 100000 UNIT/ML MT SUSP: 500000.0000 [IU] | Freq: Four times a day (QID) | OROMUCOSAL | 0 refills | Status: DC

## 2023-10-22 MED ORDER — NYSTATIN 100000 UNIT/ML MT SUSP
5.0000 mL | Freq: Once | OROMUCOSAL | Status: AC
  Administered 2023-10-22: 500000 [IU] via OROMUCOSAL
  Filled 2023-10-22: qty 5

## 2023-10-22 NOTE — ED Notes (Signed)
 Ptar called
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
## 2023-10-22 NOTE — Discharge Instructions (Addendum)
 You were seen in the ER for abdominal pain.  Your labs were reassuring. We gave you IV fluids and food.  I have given you medication for a fungal infection of your mouth, and I have sent the rest of the prescription to your pharmacy.   I have placed a consult with transitions of care to help you establish with a new case manager.

## 2023-11-20 ENCOUNTER — Telehealth: Payer: Self-pay | Admitting: Orthopedic Surgery

## 2023-11-20 NOTE — Telephone Encounter (Signed)
 Patient called and said he was here back in February and Dr. Julio Ohm wrote a prescription for the [facility] and he lost it and need it again. He wanted to know if you could fax it to [facility]#[phone]

## 2023-11-20 NOTE — Telephone Encounter (Signed)
Can you please write rx  

## 2023-12-05 ENCOUNTER — Emergency Department (HOSPITAL_COMMUNITY)
Admission: EM | Admit: 2023-12-05 | Discharge: 2023-12-06 | Disposition: A | Discharge status: Home / Self Care | Attending: Emergency Medicine | Admitting: Emergency Medicine

## 2023-12-05 ENCOUNTER — Encounter (HOSPITAL_COMMUNITY): Payer: Self-pay | Admitting: Emergency Medicine

## 2023-12-05 ENCOUNTER — Other Ambulatory Visit: Payer: Self-pay

## 2023-12-05 DIAGNOSIS — Z7901 Long term (current) use of anticoagulants: Secondary | ICD-10-CM | POA: Insufficient documentation

## 2023-12-05 DIAGNOSIS — L299 Pruritus, unspecified: Secondary | ICD-10-CM | POA: Diagnosis present

## 2023-12-05 NOTE — ED Provider Triage Note (Signed)
 Emergency Medicine Provider Triage Evaluation Note  Cory Palmer , a 63 y.o. male  was evaluated in triage.  Pt complains of dry skin. States that he is homeless and being outside has dried out his face and nose.   Review of Systems  Positive:  Negative:   Physical Exam  BP 113/71   Pulse 68   Temp 97.9 F (36.6 C) (Oral)   Resp 17   SpO2 99%  Gen:   Awake, no distress   Resp:  Normal effort  MSK:   Moves extremities without difficulty  Other:    Medical Decision Making  Medically screening exam initiated at 7:54 PM.  Appropriate orders placed.  Cory Palmer was informed that the remainder of the evaluation will be completed by another provider, this initial triage assessment does not replace that evaluation, and the importance of remaining in the ED until their evaluation is complete.     Sherra Dk, PA-C 12/05/23 1956

## 2023-12-05 NOTE — ED Triage Notes (Signed)
 Pt reports generalized itching to face. Admits to using cocaine and meth daily x 2 weeks. No acute distress noted on arrival.

## 2023-12-06 MED ORDER — DIPHENHYDRAMINE HCL 25 MG PO CAPS
25.0000 mg | ORAL_CAPSULE | Freq: Once | ORAL | Status: AC
  Administered 2023-12-06: 25 mg via ORAL
  Filled 2023-12-06: qty 1

## 2023-12-06 NOTE — ED Provider Notes (Signed)
 [place] EMERGENCY DEPARTMENT AT [facility] Provider Note   CSN: [id number] Arrival date & time: 12/05/23  1834     History  Chief Complaint  Patient presents with   Drug Problem   Pruritis    Cory Palmer is a 63 y.o. male.  The history is provided by the patient.  Drug Problem This is a chronic problem. The current episode started more than 1 week ago. The problem occurs constantly. The problem has not changed since onset.Pertinent negatives include no chest pain, no abdominal pain, no headaches and no shortness of breath. Nothing aggravates the symptoms. Nothing relieves the symptoms. He has tried nothing for the symptoms. The treatment provided no relief.  Itching of the face after using methamphetamine.       Home Medications Prior to Admission medications   Medication Sig Start Date End Date Taking? Authorizing Provider  apixaban  (ELIQUIS ) 5 MG TABS tablet Take 1 tablet (5 mg total) by mouth 2 (two) times daily. Patient not taking: Reported on 08/03/2023 06/15/23   Denese Finn, PA-C  APIXABAN  (ELIQUIS ) VTE STARTER PACK (10MG  AND 5MG ) Take as directed on package: start with two-5mg  tablets twice daily for 7 days. On day 8, switch to one-5mg  tablet twice daily. Patient not taking: Reported on 09/06/2023 01/16/23   Onetha Bile, MD  gabapentin  (NEURONTIN ) 300 MG capsule Take 300 mg by mouth 3 (three) times daily.    [provider]  metFORMIN  (GLUCOPHAGE ) 500 MG tablet Take 1 tablet (500 mg total) by mouth 2 (two) times daily with a meal. 01/16/23   Onetha Bile, MD  nystatin  (MYCOSTATIN ) 100000 UNIT/ML suspension Take 5 mLs (500,000 Units total) by mouth 4 (four) times daily. Continue until symptoms resolve, then continue for 48 hours after. 10/22/23   Roemhildt, Lorin T, PA-C  pantoprazole  (PROTONIX ) 40 MG tablet Take 40 mg by mouth daily. 08/22/23   [provider]  QUEtiapine  (SEROQUEL ) 300 MG tablet Take 1 tablet (300 mg total) by  mouth at bedtime. 09/06/23   Volanda Gruber, NP  traZODone  (DESYREL ) 50 MG tablet Take 1 tablet (50 mg total) by mouth at bedtime. 09/06/23   Volanda Gruber, NP      Allergies    Atorvastatin calcium , Gemfibrozil, and Insulin  glargine    Review of Systems   Review of Systems  Respiratory:  Negative for shortness of breath.   Cardiovascular:  Negative for chest pain.  Gastrointestinal:  Negative for abdominal pain.  Neurological:  Negative for headaches.  All other systems reviewed and are negative.   Physical Exam Updated Vital Signs BP 137/82 (BP Location: Left Arm)   Pulse 69   Temp 97.6 F (36.4 C)   Resp 18   SpO2 99%  Physical Exam Vitals and nursing note reviewed.  Constitutional:      General: He is not in acute distress.    Appearance: Normal appearance. He is well-developed. He is not diaphoretic.  HENT:     Head: Normocephalic and atraumatic.     Nose: Nose normal.  Eyes:     Conjunctiva/sclera: Conjunctivae normal.     Pupils: Pupils are equal, round, and reactive to light.  Cardiovascular:     Rate and Rhythm: Normal rate and regular rhythm.  Pulmonary:     Effort: Pulmonary effort is normal.     Breath sounds: Normal breath sounds. No wheezing or rales.  Abdominal:     General: Bowel sounds are normal.     Palpations:  Abdomen is soft.     Tenderness: There is no abdominal tenderness. There is no guarding or rebound.  Musculoskeletal:        General: Normal range of motion.     Cervical back: Normal range of motion and neck supple.  Skin:    General: Skin is warm and dry.     Capillary Refill: Capillary refill takes less than 2 seconds.  Neurological:     General: No focal deficit present.     Mental Status: He is alert and oriented to person, place, and time.     Deep Tendon Reflexes: Reflexes normal.  Psychiatric:        Mood and Affect: Mood normal.        Behavior: Behavior normal.     ED Results / Procedures / Treatments   Labs (all labs  ordered are listed, but only abnormal results are displayed) Labs Reviewed - No data to display  EKG None  Radiology No results found.  Procedures Procedures    Medications Ordered in ED Medications  diphenhydrAMINE  (BENADRYL ) capsule 25 mg (25 mg Oral Given 12/06/23 0411)    ED Course/ Medical Decision Making/ A&P                                 Medical Decision Making Itching after using methamphetamine   Amount and/or Complexity of Data Reviewed External Data Reviewed: notes.    Details: Previous notes reviewed   Risk Risk Details: Well appearing.  Benadryl  given for itching. Abstain from drug use.  Stable for discharge.  Strict returns     Final Clinical Impression(s) / ED Diagnoses Final diagnoses:  Itching   No signs of systemic illness or infection. The patient is nontoxic-appearing on exam and vital signs are within normal limits.  I have reviewed the triage vital signs and the nursing notes. Pertinent labs & imaging results that were available during my care of the patient were reviewed by me and considered in my medical decision making (see chart for details). After history, exam, and medical workup I feel the patient has been appropriately medically screened and is safe for discharge home. Pertinent diagnoses were discussed with the patient. Patient was given return precautions. Rx / DC Orders ED Discharge Orders     None         Nalea Salce, MD 12/06/23 [phone]

## 2023-12-09 LAB — AMB RESULTS CONSOLE CBG: Glucose: 124

## 2023-12-09 NOTE — Progress Notes (Signed)
 Pt received CBG, BP, and SDOH screenings. Please follow up on SDOH. Pt stated needs for all categories and wasa given all of our resources. He did not complete the whole paperwork.

## 2023-12-22 ENCOUNTER — Other Ambulatory Visit: Payer: Self-pay

## 2023-12-22 ENCOUNTER — Emergency Department (HOSPITAL_COMMUNITY): Admission: EM | Admit: 2023-12-22 | Discharge: 2023-12-22 | Disposition: A | Discharge status: Home / Self Care

## 2023-12-22 ENCOUNTER — Emergency Department (HOSPITAL_COMMUNITY)
Admission: EM | Admit: 2023-12-22 | Discharge: 2023-12-22 | Discharge status: Against Medical Advice | Attending: Emergency Medicine | Admitting: Emergency Medicine

## 2023-12-22 DIAGNOSIS — R0989 Other specified symptoms and signs involving the circulatory and respiratory systems: Secondary | ICD-10-CM | POA: Insufficient documentation

## 2023-12-22 DIAGNOSIS — Z760 Encounter for issue of repeat prescription: Secondary | ICD-10-CM | POA: Diagnosis present

## 2023-12-22 DIAGNOSIS — Z5321 Procedure and treatment not carried out due to patient leaving prior to being seen by health care provider: Secondary | ICD-10-CM | POA: Diagnosis not present

## 2023-12-22 LAB — CBC WITH DIFFERENTIAL/PLATELET
Abs Immature Granulocytes: 0.02 10*3/uL (ref 0.00–0.07)
Basophils Absolute: 0.1 10*3/uL (ref 0.0–0.1)
Basophils Relative: 1 %
Eosinophils Absolute: 0.6 10*3/uL — ABNORMAL HIGH (ref 0.0–0.5)
Eosinophils Relative: 9 %
HCT: 37.9 % — ABNORMAL LOW (ref 39.0–52.0)
Hemoglobin: 12.9 g/dL — ABNORMAL LOW (ref 13.0–17.0)
Immature Granulocytes: 0 %
Lymphocytes Relative: 35 %
Lymphs Abs: 2.1 10*3/uL (ref 0.7–4.0)
MCH: 33.9 pg (ref 26.0–34.0)
MCHC: 34 g/dL (ref 30.0–36.0)
MCV: 99.5 fL (ref 80.0–100.0)
Monocytes Absolute: 0.5 10*3/uL (ref 0.1–1.0)
Monocytes Relative: 8 %
Neutro Abs: 2.8 10*3/uL (ref 1.7–7.7)
Neutrophils Relative %: 47 %
Platelets: 117 10*3/uL — ABNORMAL LOW (ref 150–400)
RBC: 3.81 MIL/uL — ABNORMAL LOW (ref 4.22–5.81)
RDW: 13.7 % (ref 11.5–15.5)
WBC: 6.1 10*3/uL (ref 4.0–10.5)
nRBC: 0 % (ref 0.0–0.2)

## 2023-12-22 LAB — COMPREHENSIVE METABOLIC PANEL WITH GFR
ALT: 16 U/L (ref 0–44)
AST: 26 U/L (ref 15–41)
Albumin: 3.5 g/dL (ref 3.5–5.0)
Alkaline Phosphatase: 82 U/L (ref 38–126)
Anion gap: 10 (ref 5–15)
BUN: 8 mg/dL (ref 8–23)
CO2: 19 mmol/L — ABNORMAL LOW (ref 22–32)
Calcium: 8.8 mg/dL — ABNORMAL LOW (ref 8.9–10.3)
Chloride: 111 mmol/L (ref 98–111)
Creatinine, Ser: 0.64 mg/dL (ref 0.61–1.24)
GFR, Estimated: >60 mL/min (ref >60)
Glucose, Bld: 100 mg/dL — ABNORMAL HIGH (ref 70–99)
Potassium: 3.5 mmol/L (ref 3.5–5.1)
Sodium: 140 mmol/L (ref 135–145)
Total Bilirubin: 1.2 mg/dL (ref 0.0–1.2)
Total Protein: 6.5 g/dL (ref 6.5–8.1)

## 2023-12-22 NOTE — ED Notes (Signed)
 Patient came out of bathroom yelling at staff after staff rounded on patient in bathroom. Attempted to assist patient back to assigned bed. Patient stated "I didn't call for them to bring me here. I want to go home, give me a bus pass so I can go home."  Staff member explained to patient that unable to provide bus pass without being seen. Patient wheeled himself out of ER.
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
## 2023-12-22 NOTE — ED Notes (Signed)
Patient remains in bathroom.

## 2023-12-22 NOTE — ED Notes (Signed)
 Patient in bathroom will round on again.

## 2023-12-22 NOTE — ED Triage Notes (Signed)
 Patient requesting medication refill for his Seroquel  and Trazodone  , he has not taken his medication for several years , he adds sinus /chest congestion for several days .

## 2023-12-22 NOTE — ED Notes (Addendum)
 Patient refused to be seen states he was leaving. Patient was MSE'd by Dr. Maralee Senate at [facility] triage.

## 2023-12-24 ENCOUNTER — Emergency Department (HOSPITAL_COMMUNITY)
Admission: EM | Admit: 2023-12-24 | Discharge: 2023-12-24 | Discharge status: Against Medical Advice | Attending: Emergency Medicine | Admitting: Emergency Medicine

## 2023-12-24 ENCOUNTER — Other Ambulatory Visit: Payer: Self-pay

## 2023-12-24 ENCOUNTER — Encounter (HOSPITAL_COMMUNITY): Payer: Self-pay

## 2023-12-24 DIAGNOSIS — R197 Diarrhea, unspecified: Secondary | ICD-10-CM | POA: Diagnosis present

## 2023-12-24 DIAGNOSIS — L299 Pruritus, unspecified: Secondary | ICD-10-CM | POA: Insufficient documentation

## 2023-12-24 DIAGNOSIS — Z5321 Procedure and treatment not carried out due to patient leaving prior to being seen by health care provider: Secondary | ICD-10-CM | POA: Insufficient documentation

## 2023-12-24 LAB — CBC
HCT: 35.5 % — ABNORMAL LOW (ref 39.0–52.0)
Hemoglobin: 12.3 g/dL — ABNORMAL LOW (ref 13.0–17.0)
MCH: 33.7 pg (ref 26.0–34.0)
MCHC: 34.6 g/dL (ref 30.0–36.0)
MCV: 97.3 fL (ref 80.0–100.0)
Platelets: 111 10*3/uL — ABNORMAL LOW (ref 150–400)
RBC: 3.65 MIL/uL — ABNORMAL LOW (ref 4.22–5.81)
RDW: 13.3 % (ref 11.5–15.5)
WBC: 5.3 10*3/uL (ref 4.0–10.5)
nRBC: 0 % (ref 0.0–0.2)

## 2023-12-24 LAB — COMPREHENSIVE METABOLIC PANEL WITH GFR
ALT: 16 U/L (ref 0–44)
AST: 33 U/L (ref 15–41)
Albumin: 3.4 g/dL — ABNORMAL LOW (ref 3.5–5.0)
Alkaline Phosphatase: 83 U/L (ref 38–126)
Anion gap: 12 (ref 5–15)
BUN: 11 mg/dL (ref 8–23)
CO2: 21 mmol/L — ABNORMAL LOW (ref 22–32)
Calcium: 9.4 mg/dL (ref 8.9–10.3)
Chloride: 110 mmol/L (ref 98–111)
Creatinine, Ser: 0.98 mg/dL (ref 0.61–1.24)
GFR, Estimated: >60 mL/min (ref >60)
Glucose, Bld: 143 mg/dL — ABNORMAL HIGH (ref 70–99)
Potassium: 3.7 mmol/L (ref 3.5–5.1)
Sodium: 143 mmol/L (ref 135–145)
Total Bilirubin: 1.9 mg/dL — ABNORMAL HIGH (ref 0.0–1.2)
Total Protein: 6.3 g/dL — ABNORMAL LOW (ref 6.5–8.1)

## 2023-12-24 LAB — LIPASE, BLOOD: Lipase: 42 U/L (ref 11–51)

## 2023-12-24 NOTE — ED Provider Triage Note (Signed)
 Emergency Medicine Provider Triage Evaluation Note  Cory Palmer , a 63 y.o. male  was evaluated in triage.  Pt complains of diarrhea for the past week.  Patient states that he was seen yesterday for this and ultimately left AMA.  Patient states that he is having watery stools but denies any nausea vomiting or blood in his stool or black stools.  Patient denies any fevers or abdominal pain.  Patient is unsure of what caused the diarrhea.  Patient cannot tell me how many bowel movements he has a day.  Review of Systems  Positive:  Negative:   Physical Exam  BP 96/68   Pulse 80   Temp 98.5 F (36.9 C) (Oral)   Resp 17   Wt 113 kg   SpO2 99%   BMI 33.79 kg/m  Gen:   Awake, no distress   Resp:  Normal effort  MSK:   Moves extremities without difficulty  Other:    Medical Decision Making  Medically screening exam initiated at 5:27 PM.  Appropriate orders placed.  JACOBIE STAMEY was informed that the remainder of the evaluation will be completed by another provider, this initial triage assessment does not replace that evaluation, and the importance of remaining in the ED until their evaluation is complete.  Workup initiated, patient stable.   Denese Finn, PA-C 12/24/23 1728

## 2023-12-24 NOTE — ED Triage Notes (Signed)
 Pt BIB [facility] from street for diarrhea and facial itching. Pt seen last night for same but left AMA. 106/70 80HR 191cbg +ETOH

## 2023-12-24 NOTE — ED Notes (Signed)
Pt called X 3 for vitals recheck. Pt could not be found.  

## 2023-12-24 NOTE — ED Notes (Signed)
 Called pt to obtain urine sample, no answer...Cory AasAaron AasKM

## 2024-01-05 ENCOUNTER — Emergency Department (HOSPITAL_COMMUNITY)
Admission: EM | Admit: 2024-01-05 | Discharge: 2024-01-05 | Disposition: A | Discharge status: Home / Self Care | Payer: Self-pay | Attending: Emergency Medicine | Admitting: Emergency Medicine

## 2024-01-05 ENCOUNTER — Other Ambulatory Visit: Payer: Self-pay

## 2024-01-05 ENCOUNTER — Emergency Department (HOSPITAL_COMMUNITY): Payer: Self-pay

## 2024-01-05 DIAGNOSIS — F10120 Alcohol abuse with intoxication, uncomplicated: Secondary | ICD-10-CM | POA: Insufficient documentation

## 2024-01-05 DIAGNOSIS — Z79899 Other long term (current) drug therapy: Secondary | ICD-10-CM | POA: Insufficient documentation

## 2024-01-05 DIAGNOSIS — Z23 Encounter for immunization: Secondary | ICD-10-CM | POA: Insufficient documentation

## 2024-01-05 DIAGNOSIS — E119 Type 2 diabetes mellitus without complications: Secondary | ICD-10-CM | POA: Diagnosis not present

## 2024-01-05 DIAGNOSIS — Y908 Blood alcohol level of 240 mg/100 ml or more: Secondary | ICD-10-CM | POA: Diagnosis not present

## 2024-01-05 DIAGNOSIS — W050XXA Fall from non-moving wheelchair, initial encounter: Secondary | ICD-10-CM | POA: Diagnosis not present

## 2024-01-05 DIAGNOSIS — S0081XA Abrasion of other part of head, initial encounter: Secondary | ICD-10-CM | POA: Diagnosis not present

## 2024-01-05 DIAGNOSIS — Z7901 Long term (current) use of anticoagulants: Secondary | ICD-10-CM | POA: Insufficient documentation

## 2024-01-05 DIAGNOSIS — Z5900 Homelessness unspecified: Secondary | ICD-10-CM | POA: Insufficient documentation

## 2024-01-05 DIAGNOSIS — R4781 Slurred speech: Secondary | ICD-10-CM | POA: Diagnosis present

## 2024-01-05 DIAGNOSIS — I10 Essential (primary) hypertension: Secondary | ICD-10-CM | POA: Diagnosis not present

## 2024-01-05 DIAGNOSIS — F10920 Alcohol use, unspecified with intoxication, uncomplicated: Secondary | ICD-10-CM

## 2024-01-05 DIAGNOSIS — W19XXXA Unspecified fall, initial encounter: Secondary | ICD-10-CM

## 2024-01-05 LAB — CBC WITH DIFFERENTIAL/PLATELET
Abs Immature Granulocytes: 0 10*3/uL (ref 0.00–0.07)
Basophils Absolute: 0 10*3/uL (ref 0.0–0.1)
Basophils Relative: 1 %
Eosinophils Absolute: 0.4 10*3/uL (ref 0.0–0.5)
Eosinophils Relative: 7 %
HCT: 35 % — ABNORMAL LOW (ref 39.0–52.0)
Hemoglobin: 11.9 g/dL — ABNORMAL LOW (ref 13.0–17.0)
Immature Granulocytes: 0 %
Lymphocytes Relative: 39 %
Lymphs Abs: 2.3 10*3/uL (ref 0.7–4.0)
MCH: 33.7 pg (ref 26.0–34.0)
MCHC: 34 g/dL (ref 30.0–36.0)
MCV: 99.2 fL (ref 80.0–100.0)
Monocytes Absolute: 0.5 10*3/uL (ref 0.1–1.0)
Monocytes Relative: 9 %
Neutro Abs: 2.7 10*3/uL (ref 1.7–7.7)
Neutrophils Relative %: 44 %
Platelets: 92 10*3/uL — ABNORMAL LOW (ref 150–400)
RBC: 3.53 MIL/uL — ABNORMAL LOW (ref 4.22–5.81)
RDW: 13.2 % (ref 11.5–15.5)
Smear Review: DECREASED
WBC: 5.8 10*3/uL (ref 4.0–10.5)
nRBC: 0 % (ref 0.0–0.2)

## 2024-01-05 LAB — MAGNESIUM: Magnesium: 1.7 mg/dL (ref 1.7–2.4)

## 2024-01-05 LAB — COMPREHENSIVE METABOLIC PANEL WITH GFR
ALT: 13 U/L (ref 0–44)
AST: 27 U/L (ref 15–41)
Albumin: 3.2 g/dL — ABNORMAL LOW (ref 3.5–5.0)
Alkaline Phosphatase: 76 U/L (ref 38–126)
Anion gap: 9 (ref 5–15)
BUN: 11 mg/dL (ref 8–23)
CO2: 18 mmol/L — ABNORMAL LOW (ref 22–32)
Calcium: 8.7 mg/dL — ABNORMAL LOW (ref 8.9–10.3)
Chloride: 113 mmol/L — ABNORMAL HIGH (ref 98–111)
Creatinine, Ser: 0.72 mg/dL (ref 0.61–1.24)
GFR, Estimated: >60 mL/min (ref >60)
Glucose, Bld: 99 mg/dL (ref 70–99)
Potassium: 3.2 mmol/L — ABNORMAL LOW (ref 3.5–5.1)
Sodium: 140 mmol/L (ref 135–145)
Total Bilirubin: 1.5 mg/dL — ABNORMAL HIGH (ref 0.0–1.2)
Total Protein: 5.9 g/dL — ABNORMAL LOW (ref 6.5–8.1)

## 2024-01-05 LAB — ETHANOL: Alcohol, Ethyl (B): 361 mg/dL (ref <15)

## 2024-01-05 LAB — CK: Total CK: 45 U/L — ABNORMAL LOW (ref 49–397)

## 2024-01-05 MED ORDER — SODIUM CHLORIDE 0.9 % IV BOLUS
1000.0000 mL | Freq: Once | INTRAVENOUS | Status: AC
  Administered 2024-01-05: 1000 mL via INTRAVENOUS

## 2024-01-05 MED ORDER — TETANUS-DIPHTH-ACELL PERTUSSIS 5-2.5-18.5 LF-MCG/0.5 IM SUSY
0.5000 mL | PREFILLED_SYRINGE | Freq: Once | INTRAMUSCULAR | Status: AC
  Administered 2024-01-05: 0.5 mL via INTRAMUSCULAR
  Filled 2024-01-05: qty 0.5

## 2024-01-05 MED ORDER — LORAZEPAM 2 MG/ML IJ SOLN
INTRAMUSCULAR | Status: AC
  Filled 2024-01-05: qty 1

## 2024-01-05 MED ORDER — LORAZEPAM 2 MG/ML IJ SOLN
1.0000 mg | Freq: Once | INTRAMUSCULAR | Status: AC
  Administered 2024-01-05: 1 mg via INTRAVENOUS

## 2024-01-05 NOTE — ED Triage Notes (Signed)
 PT BIB [facility] after being outside in his motorized wheelchair and falling out. PT was found lying down on the ground beside his wheelchair.PT had slurred speech and abrasions on his forehead and knee.PT expresses uses alcohol and crack.

## 2024-01-05 NOTE — ED Provider Notes (Signed)
 [place] EMERGENCY DEPARTMENT AT [facility] Provider Note   CSN: [id number] Arrival date & time: 01/05/24  1722     History  Chief Complaint  Patient presents with   Cory Palmer is a 63 y.o. male.  Pt is a 63 yo male with unkn medical hx.  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  Pt was found on [facility]. After falling out of his wheelchair.  EMS thinks he dropped some food he was eating and reached down and tried to pick it up.  Pt does admit to alcohol and crack use today.  He denies any pain and is agitated upon arrival.  He did hit his head.  Meds unkn.  Loc unkn.  Pt came in as a level 2 trauma.  We now know his name and his medical hx:  Osteomyelitis, bipolar d/o, dm, htn, cirrhosis, afib (on Eliquis)         Home Medications Prior to Admission medications   Not on File      Allergies    Patient has no allergy information on record.    Review of Systems   Review of Systems  Musculoskeletal:        Bilateral bka  Skin:        Abrasions to both knees and to forehead  All other systems reviewed and are negative.   Physical Exam Updated Vital Signs BP (!) 99/48   Pulse 60   Temp (!) 97.5 F (36.4 C) (Oral)   Resp 18   SpO2 97%  Physical Exam Vitals and nursing note reviewed.  Constitutional:      Appearance: Normal appearance.  HENT:     Head: Normocephalic.     Comments: Abrasion to forehead    Right Ear: External ear normal.     Left Ear: External ear normal.     Nose: Nose normal.     Mouth/Throat:     Mouth: Mucous membranes are moist.     Pharynx: Oropharynx is clear.     Comments: adentulous Eyes:     Extraocular Movements: Extraocular movements intact.     Conjunctiva/sclera: Conjunctivae normal.     Pupils: Pupils are equal, round, and reactive to light.  Cardiovascular:     Rate and Rhythm: Normal rate and regular rhythm.     Pulses: Normal pulses.     Heart sounds: Normal heart sounds.  Pulmonary:      Effort: Pulmonary effort is normal.     Breath sounds: Normal breath sounds.  Abdominal:     General: Abdomen is flat. Bowel sounds are normal.     Palpations: Abdomen is soft.  Musculoskeletal:     Cervical back: Normal range of motion and neck supple.     Comments: Bilateral bka  Skin:    General: Skin is warm.     Capillary Refill: Capillary refill takes less than 2 seconds.  Neurological:     Mental Status: He is alert.     Comments: Pt is moving all 4 extremities.  He is able to tell me his name.  He is slurring his speech.  He tells me he's homeless.  Psychiatric:        Behavior: Behavior is agitated.     ED Results / Procedures / Treatments   Labs (all labs ordered are listed, but only abnormal results are displayed) Labs Reviewed  CBC WITH DIFFERENTIAL/PLATELET - Abnormal; Notable for the following components:  Result Value   RBC 3.53 (*)    Hemoglobin 11.9 (*)    HCT 35.0 (*)    Platelets 92 (*)    All other components within normal limits  COMPREHENSIVE METABOLIC PANEL WITH GFR - Abnormal; Notable for the following components:   Potassium 3.2 (*)    Chloride 113 (*)    CO2 18 (*)    Calcium 8.7 (*)    Total Protein 5.9 (*)    Albumin 3.2 (*)    Total Bilirubin 1.5 (*)    All other components within normal limits  ETHANOL - Abnormal; Notable for the following components:   Alcohol, Ethyl (B) 361 (*)    All other components within normal limits  CK - Abnormal; Notable for the following components:   Total CK 45 (*)    All other components within normal limits  MAGNESIUM  URINALYSIS, ROUTINE W REFLEX MICROSCOPIC  RAPID URINE DRUG SCREEN, HOSP PERFORMED    EKG None  Radiology CT Cervical Spine Wo Contrast Result Date: 01/05/2024 CLINICAL DATA:  Blunt trauma.  Fall from motorized wheelchair. EXAM: CT CERVICAL SPINE WITHOUT CONTRAST TECHNIQUE: Multidetector CT imaging of the cervical spine was performed without intravenous contrast. Multiplanar CT  image reconstructions were also generated. RADIATION DOSE REDUCTION: This exam was performed according to the departmental dose-optimization program which includes automated exposure control, adjustment of the mA and/or kV according to patient size and/or use of iterative reconstruction technique. COMPARISON:  08/03/2023 FINDINGS: Significant motion artifact.  Examination is marginally diagnostic. Alignment: No obvious traumatic subluxation, although limited by motion. Skull base and vertebrae: No obvious fracture, although limited by motion. Soft tissues and spinal canal: Not well assessed. Disc levels: Degenerative disc disease with anterior spurring C3-C4 through C6-C7. Upper chest: Emphysema.  More detailed assessment is limited. Other: None. IMPRESSION: 1. Significant motion artifact. Examination is marginally diagnostic. 2. No obvious fracture or traumatic subluxation of the cervical spine. If concern for fracture persists, recommend repeat exam when patient is able to hold still. Electronically Signed   By: Chadwick Colonel M.D.   On: 01/05/2024 18:42   CT Head Wo Contrast Result Date: 01/05/2024 CLINICAL DATA:  Fall from motorized wheelchair. EXAM: CT HEAD WITHOUT CONTRAST TECHNIQUE: Contiguous axial images were obtained from the base of the skull through the vertex without intravenous contrast. RADIATION DOSE REDUCTION: This exam was performed according to the departmental dose-optimization program which includes automated exposure control, adjustment of the mA and/or kV according to patient size and/or use of iterative reconstruction technique. COMPARISON:  Head CT 08/03/2023 FINDINGS: Brain: Motion artifact limitations, despite repeat acquisitions. Allowing for this, no evidence of acute hemorrhage. No subdural or extra-axial collection. Age related atrophy. No evidence of acute ischemia. No midline shift. Vascular: No hyperdense vessel or unexpected calcification. Skull: No fracture or focal lesion.  Sinuses/Orbits: Motion obscured on both acquisitions, allowing for this, no evidence of acute abnormality. There is scattered mucosal thickening throughout the ethmoid air cells and maxillary sinuses. Other: None. IMPRESSION: Motion artifact limitations, despite repeat acquisitions. Allowing for this, no acute intracranial abnormality. Electronically Signed   By: Chadwick Colonel M.D.   On: 01/05/2024 18:39   DG Pelvis Portable Result Date: 01/05/2024 CLINICAL DATA:  Fall EXAM: PORTABLE PELVIS 1-2 VIEWS COMPARISON:  None Available. FINDINGS: There is no evidence of pelvic fracture or diastasis. No pelvic bone lesions are seen. IMPRESSION: Negative. Electronically Signed   By: Janeece Mechanic M.D.   On: 01/05/2024 17:37   DG Chest Portable 1  View Result Date: 01/05/2024 CLINICAL DATA:  Fall EXAM: PORTABLE CHEST 1 VIEW COMPARISON:  None Available. FINDINGS: Heart and mediastinal contours are within normal limits. No focal opacities or effusions. No acute bony abnormality. IMPRESSION: No active disease. Electronically Signed   By: Janeece Mechanic M.D.   On: 01/05/2024 17:36    Procedures Procedures    Medications Ordered in ED Medications  sodium chloride  0.9 % bolus 1,000 mL (0 mLs Intravenous Stopped 01/05/24 2109)  LORazepam  (ATIVAN ) injection 1 mg (1 mg Intravenous Given 01/05/24 1744)  Tdap (BOOSTRIX ) injection 0.5 mL (0.5 mLs Intramuscular Given 01/05/24 2137)  sodium chloride  0.9 % bolus 1,000 mL (0 mLs Intravenous Stopped 01/05/24 2252)    ED Course/ Medical Decision Making/ A&P                                 Medical Decision Making Amount and/or Complexity of Data Reviewed Labs: ordered. Radiology: ordered.  Risk Prescription drug management.   This patient presents to the ED for concern of fall, this involves an extensive number of treatment options, and is a complaint that carries with it a high risk of complications and morbidity.  The differential diagnosis includes multiple  trauma   Co morbidities that complicate the patient evaluation  Osteomyelitis, bipolar d/o, dm, htn, cirrhosis, afib (on Eliquis)   Additional history obtained:  Additional history obtained from epic chart review External records from outside source obtained and reviewed including EMS report   Lab Tests:  I Ordered, and personally interpreted labs.  The pertinent results include:  cbc with hgb sl low at 11.0, cmp with k low at 3.2, tb sl elevated at 1.5   Imaging Studies ordered:  I ordered imaging studies including cxr, pelvis, ct head/c-spine  I independently visualized and interpreted imaging which showed  CXR: No active disease.  Pelvis: Negative.  CT head/c-spine: Motion artifact limitations, despite repeat acquisitions. Allowing  for this, no acute intracranial abnormality.    Significant motion artifact. Examination is marginally  diagnostic.  2. No obvious fracture or traumatic subluxation of the cervical  spine. If concern for fracture persists, recommend repeat exam when  patient is able to hold still.       I agree with the radiologist interpretation   Cardiac Monitoring:  The patient was maintained on a cardiac monitor.  I personally viewed and interpreted the cardiac monitored which showed an underlying rhythm of: nsr   Medicines ordered and prescription drug management:  I ordered medication including ivfs  for sx  Reevaluation of the patient after these medicines showed that the patient improved I have reviewed the patients home medicines and have made adjustments as needed   Test Considered:  ct   Problem List / ED Course:  Fall:  pt has no internal injury.  He was initially intoxicated, but was observed for several hours and is back to normal mental status.  He does not want to stay any longer.  He is stable for d/c.  Return if worse.   Reevaluation:  After the interventions noted above, I reevaluated the patient and found that they  have :improved   Social Determinants of Health:  homeless   Dispostion:  After consideration of the diagnostic results and the patients response to treatment, I feel that the patent would benefit from discharge with outpatient f/u.          Final Clinical Impression(s) / ED Diagnoses  Final diagnoses:  Fall, initial encounter  Alcoholic intoxication without complication [facility])    Rx / DC Orders ED Discharge Orders     None         Sueellen Emery, MD 01/05/24 2301

## 2024-01-05 NOTE — ED Notes (Signed)
 PT was escorted to the lobby with a bus pass.

## 2024-01-05 NOTE — ED Notes (Signed)
 Patient transported to CT

## 2024-01-05 NOTE — ED Notes (Signed)
 PT was found on the floor in front of his wheelchair attempting to sit in it. PT stated that he did not fall or hit his head and that he just needed to get home. PT was assisted by Alvie Jolly (NT), Mariah Shines (Trauma RN) and this RN to get into his wheelchair. PT also pulled his IV out of his arm when getting out of bed

## 2024-01-05 NOTE — ED Notes (Signed)
 Counted $87 with Santiago(Secretary) and placed money and a pack of cigarettes in red hazard bag. PT label was placed on bag and bag placed in PT's belongings

## 2024-01-05 NOTE — ED Notes (Signed)
 Patient transported to CT with this RN

## 2024-01-10 ENCOUNTER — Encounter (HOSPITAL_COMMUNITY): Payer: Self-pay

## 2024-01-10 ENCOUNTER — Other Ambulatory Visit: Payer: Self-pay

## 2024-01-10 ENCOUNTER — Emergency Department (HOSPITAL_COMMUNITY)
Admission: EM | Admit: 2024-01-10 | Discharge: 2024-01-11 | Disposition: A | Discharge status: Home / Self Care | Attending: Emergency Medicine | Admitting: Emergency Medicine

## 2024-01-10 ENCOUNTER — Emergency Department (HOSPITAL_COMMUNITY)

## 2024-01-10 DIAGNOSIS — Z5900 Homelessness unspecified: Secondary | ICD-10-CM | POA: Diagnosis not present

## 2024-01-10 DIAGNOSIS — W050XXA Fall from non-moving wheelchair, initial encounter: Secondary | ICD-10-CM | POA: Diagnosis not present

## 2024-01-10 DIAGNOSIS — Y92480 Sidewalk as the place of occurrence of the external cause: Secondary | ICD-10-CM | POA: Diagnosis not present

## 2024-01-10 DIAGNOSIS — S065X0A Traumatic subdural hemorrhage without loss of consciousness, initial encounter: Secondary | ICD-10-CM | POA: Insufficient documentation

## 2024-01-10 DIAGNOSIS — S0081XA Abrasion of other part of head, initial encounter: Secondary | ICD-10-CM | POA: Diagnosis not present

## 2024-01-10 DIAGNOSIS — Z7901 Long term (current) use of anticoagulants: Secondary | ICD-10-CM | POA: Insufficient documentation

## 2024-01-10 DIAGNOSIS — G9608 Other cranial cerebrospinal fluid leak: Secondary | ICD-10-CM

## 2024-01-10 DIAGNOSIS — F10920 Alcohol use, unspecified with intoxication, uncomplicated: Secondary | ICD-10-CM | POA: Insufficient documentation

## 2024-01-10 DIAGNOSIS — W19XXXA Unspecified fall, initial encounter: Secondary | ICD-10-CM

## 2024-01-10 DIAGNOSIS — E119 Type 2 diabetes mellitus without complications: Secondary | ICD-10-CM | POA: Insufficient documentation

## 2024-01-10 DIAGNOSIS — S0990XA Unspecified injury of head, initial encounter: Secondary | ICD-10-CM | POA: Diagnosis present

## 2024-01-10 DIAGNOSIS — S80211A Abrasion, right knee, initial encounter: Secondary | ICD-10-CM | POA: Diagnosis not present

## 2024-01-10 DIAGNOSIS — Z7984 Long term (current) use of oral hypoglycemic drugs: Secondary | ICD-10-CM | POA: Diagnosis not present

## 2024-01-10 NOTE — ED Notes (Signed)
 Pt continues to be agitated with staff, and yells that he was leaving. Pt proceeded to scoot to end of the bed and pick up the connected Dinamap and threw it on the ground. This tech, rn, pa, and security to bedside. Consulting civil engineer, and Facilities manager made aware.

## 2024-01-10 NOTE — Discharge Instructions (Addendum)
 You were evaluated this evening after a fall.  Your CT scans showed a possible hygroma. I discussed your findings with neurosurgery and no follow up is required.  You may follow-up as needed your primary care provider.  If you develop any life-threatening symptoms return to the emergency department.

## 2024-01-10 NOTE — ED Notes (Signed)
 Pt getting verbally aggressive, cursing at staff and threw dinamap on the ground. This RN attempted verbal de-escalation, however pt continues to yell. Pt stating he wants to leave. This RN, EDP, security, and charge nurse at bedside. Pt agreeable to stay for CT scans after speaking with EDP.

## 2024-01-10 NOTE — ED Notes (Signed)
 Patient transported to CT

## 2024-01-10 NOTE — ED Notes (Signed)
 Pt stated to this tech and RN he wanted to leave. Pt being inpatient and agitated with staff. PA and PA student to bedside.

## 2024-01-10 NOTE — ED Provider Notes (Signed)
 [place] EMERGENCY DEPARTMENT AT [facility] Provider Note   CSN: [id number] Arrival date & time: 01/10/24  2122     History  Chief Complaint  Patient presents with   Cory Palmer is a 63 y.o. male.  Patient with past medical history significant for type II DM, homelessness, alcohol abuse presents the emergency department via EMS due to a fall.  Patient reports that he was in his wheelchair was rolling and hit the curb, falling forward onto the sidewalk.  He has an abrasion to the right side of his face and abrasion to his right lower extremity.  He does not take blood thinners.  He denies complaints at this time and is concerned about being discharged in time for a meeting with a caseworker.   Fall       Home Medications Prior to Admission medications   Medication Sig Start Date End Date Taking? Authorizing Provider  apixaban  (ELIQUIS ) 5 MG TABS tablet Take 1 tablet (5 mg total) by mouth 2 (two) times daily. Patient not taking: Reported on 08/03/2023 06/15/23   Aleatha Hunting T, PA-C  APIXABAN  (ELIQUIS ) VTE STARTER PACK (10MG  AND 5MG ) Take as directed on package: start with two-5mg  tablets twice daily for 7 days. On day 8, switch to one-5mg  tablet twice daily. Patient not taking: Reported on 09/06/2023 01/16/23   Onetha Bile, MD  gabapentin  (NEURONTIN ) 300 MG capsule Take 300 mg by mouth 3 (three) times daily.    [provider]  metFORMIN  (GLUCOPHAGE ) 500 MG tablet Take 1 tablet (500 mg total) by mouth 2 (two) times daily with a meal. 01/16/23   Onetha Bile, MD  nystatin  (MYCOSTATIN ) 100000 UNIT/ML suspension Take 5 mLs (500,000 Units total) by mouth 4 (four) times daily. Continue until symptoms resolve, then continue for 48 hours after. 10/22/23   Roemhildt, Lorin T, PA-C  pantoprazole  (PROTONIX ) 40 MG tablet Take 40 mg by mouth daily. 08/22/23   [provider]  QUEtiapine  (SEROQUEL ) 300 MG tablet Take 1 tablet (300 mg total) by mouth  at bedtime. 09/06/23   Volanda Gruber, NP  traZODone  (DESYREL ) 50 MG tablet Take 1 tablet (50 mg total) by mouth at bedtime. 09/06/23   Volanda Gruber, NP      Allergies    Atorvastatin calcium , Gemfibrozil, and Insulin  glargine    Review of Systems   Review of Systems  Physical Exam Updated Vital Signs BP 112/78   Pulse 100   Temp 97.7 F (36.5 C) (Oral)   Resp 14   SpO2 97%  Physical Exam Vitals and nursing note reviewed.  HENT:     Head: Normocephalic.     Comments: Abrasion to right forehead Eyes:     Pupils: Pupils are equal, round, and reactive to light.  Cardiovascular:     Rate and Rhythm: Normal rate and regular rhythm.  Pulmonary:     Effort: Pulmonary effort is normal.     Breath sounds: Normal breath sounds.  Musculoskeletal:        General: No signs of injury.     Cervical back: Normal range of motion and neck supple. No tenderness.     Comments: Bilateral BKA, abrasion to right knee  Skin:    General: Skin is dry.  Neurological:     Mental Status: He is alert.  Psychiatric:        Speech: Speech normal.        Behavior: Behavior normal.  ED Results / Procedures / Treatments   Labs (all labs ordered are listed, but only abnormal results are displayed) Labs Reviewed - No data to display  EKG None  Radiology CT Cervical Spine Wo Contrast Result Date: 01/10/2024 CLINICAL DATA:  Fall out of wheelchair. EXAM: CT CERVICAL SPINE WITHOUT CONTRAST TECHNIQUE: Multidetector CT imaging of the cervical spine was performed without intravenous contrast. Multiplanar CT image reconstructions were also generated. RADIATION DOSE REDUCTION: This exam was performed according to the departmental dose-optimization program which includes automated exposure control, adjustment of the mA and/or kV according to patient size and/or use of iterative reconstruction technique. COMPARISON:  08/03/2023 FINDINGS: Alignment: Normal Skull base and vertebrae: No acute fracture. No  primary bone lesion or focal pathologic process. Soft tissues and spinal canal: No prevertebral fluid or swelling. No visible canal hematoma. Disc levels: Flowing anterior osteophytes. No focal disc herniation. Upper chest: Biapical scarring, unchanged since prior study. Other: None IMPRESSION: No acute bony abnormality. Electronically Signed   By: Janeece Mechanic M.D.   On: 01/10/2024 23:24   CT Head Wo Contrast Result Date: 01/10/2024 CLINICAL DATA:  Fall out of wheelchair. EXAM: CT HEAD WITHOUT CONTRAST TECHNIQUE: Contiguous axial images were obtained from the base of the skull through the vertex without intravenous contrast. RADIATION DOSE REDUCTION: This exam was performed according to the departmental dose-optimization program which includes automated exposure control, adjustment of the mA and/or kV according to patient size and/or use of iterative reconstruction technique. COMPARISON:  01/05/2024 FINDINGS: Brain: Small low-density subdural fluid collection noted overlying the left frontal lobe, best seen on coronal image 48. This measures up to 5 mm in thickness and is new since prior study compatible with small subdural hygroma. Suspect small subdural hygroma on the right as well, 3-4 mm. No significant mass effect or midline shift. No intraparenchymal hemorrhage, acute infarction or hydrocephalus. Vascular: No hyperdense vessel or unexpected calcification. Skull: No acute calvarial abnormality. Sinuses/Orbits: No acute findings Other: None IMPRESSION: New small low-density subdural fluid collections, left slightly larger than right most compatible with subdural hygromas. No significant mass effect or midline shift. Electronically Signed   By: Janeece Mechanic M.D.   On: 01/10/2024 23:22    Procedures Procedures    Medications Ordered in ED Medications - No data to display  ED Course/ Medical Decision Making/ A&P                                 Medical Decision Making Amount and/or Complexity of  Data Reviewed Radiology: ordered.   This patient presents to the ED for concern of fall, this involves an extensive number of treatment options, and is a complaint that carries with it a high risk of complications and morbidity.  The differential diagnosis includes intracranial abnormality, fracture, dislocation, soft tissue injury, others   Co morbidities / Chronic conditions that complicate the patient evaluation  Alcohol use, type II DM   Additional history obtained:  Additional history obtained from EMR    Imaging Studies ordered:  I ordered imaging studies including CT head and cervical spine I independently visualized and interpreted imaging which showed  New small low-density subdural fluid collections, left slightly  larger than right most compatible with subdural hygromas. No  significant mass effect or midline shift.  No acute bony abnormality of the cervical spine I agree with the radiologist interpretation   Consultations Obtained:  I requested consultation with neurosurgery,Kim Meyran, NP,  and discussed lab and imaging findings as well as pertinent plan - they recommend: no follow up required   Social Determinants of Health:  Patient is homeless   Test / Admission - Considered:  Patient initially denies any workup, stating he wants to go home at this time.  He became somewhat agitated.  I was able to de-escalate the situation with the patient and talk to him about the risks of hitting his head while intoxicated and recommended that the patient get CT scans of the head and cervical spine.  I told the patient that if the scans were negative I would happily discharge him and would see if nursing staff was able to get the patient a bus pass for the morning bus.  Patient agreed to stay for imaging. CT imaging shows possible hygromas.  Case discussed with neurosurgery who states no follow-up is required at this time.  Patient is stable for  discharge.         Final Clinical Impression(s) / ED Diagnoses Final diagnoses:  Fall, initial encounter  Alcoholic intoxication without complication (HCC)  Subdural hygroma    Rx / DC Orders ED Discharge Orders     None         Delories Fetter 01/10/24 2351    Horton, Vonzella Guernsey, MD 01/11/24 [phone]

## 2024-01-10 NOTE — ED Triage Notes (Signed)
 [place] [facility] [place]. Pt was rolling in his wheelchair, hit the curb causing him to fall out. Pt has lac to R eyebrow, and abrasion to LLE; bleeding controlled. Pt reports having 1 beer tonight. Denies Blood Thinners.

## 2024-01-11 NOTE — ED Notes (Signed)
 Discharge instructions reviewed with patient. Follow-up care reviewed with pt. Pt refusing vital signs upon discharge. Pt given bus pass.

## 2024-01-15 LAB — AMB RESULTS CONSOLE CBG: Glucose: 123

## 2024-01-15 NOTE — Progress Notes (Signed)
 Patient came to screening at [facility]. Blood pressure within range. Non Fasting glucose 123. Patient stated he was having some transportation issues with getting to provider at [facility]. Pt also indicated he no longer has phone access. We discussed maintaining stress as well as providing SDOH resources.

## 2024-01-21 IMAGING — CT CT ABD-PELV W/ CM
2 of 5 series · 16 of 46 positions shown, 18 images · IV contrast (agent unspecified)
Comparison: CT abdomen/pelvis 10/10/2020

CLINICAL DATA: Nausea, vomiting, abdominal pain

EXAM:
CT ABDOMEN AND PELVIS WITH CONTRAST
TECHNIQUE: Multidetector CT imaging of the abdomen and pelvis was performed
using the standard protocol following bolus administration of
intravenous contrast.

[Series 2: axial st · axial · 0.94mm/px · z∈[-562,-57]mm · 13 of 117 slices shown, 15 images]
[im 8/117  soft-tissue]
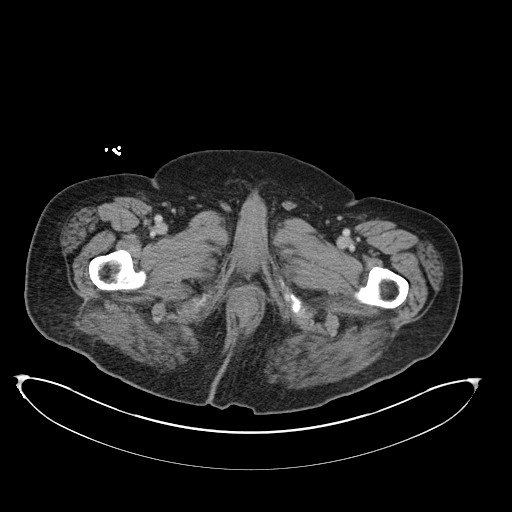
[im 8/117  bone]
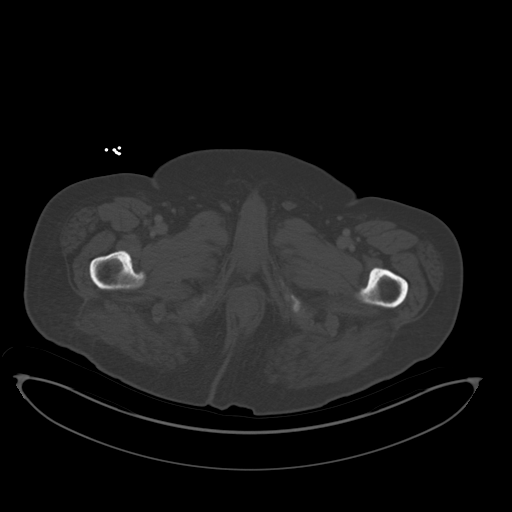
[im 16/117  soft-tissue]
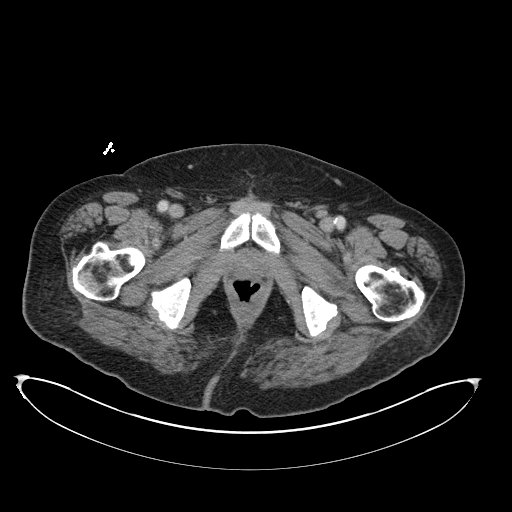
[im 24/117  soft-tissue]
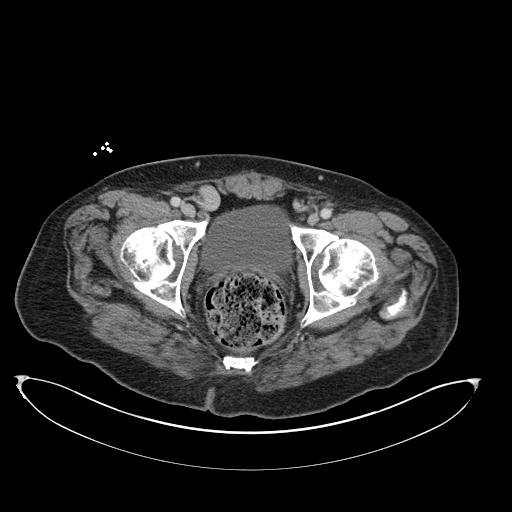
[im 31/117  soft-tissue]
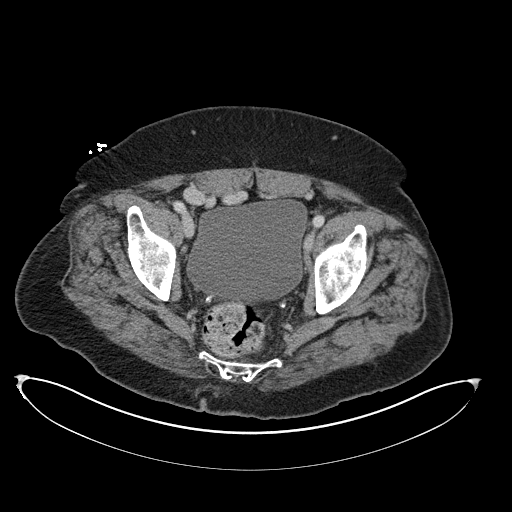
[im 39/117  soft-tissue]
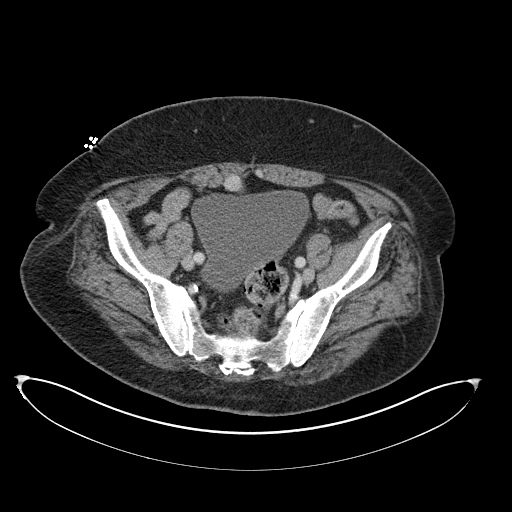
[im 47/117  soft-tissue]
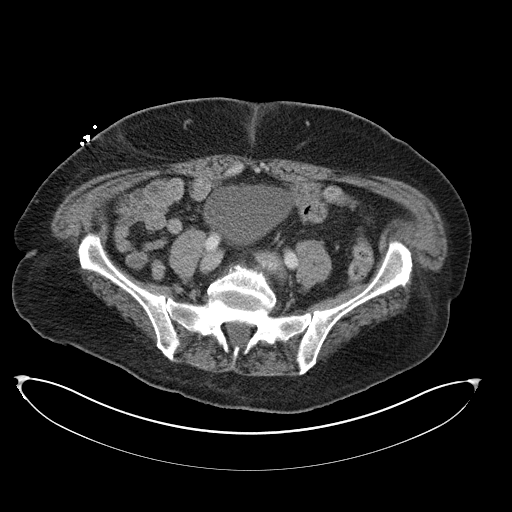
[im 62/117  soft-tissue]
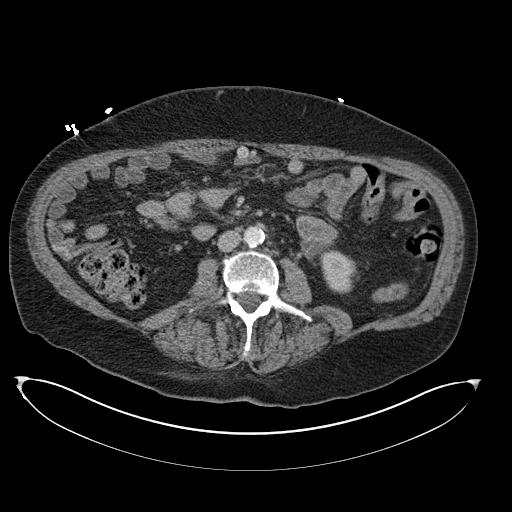
[im 70/117  soft-tissue]
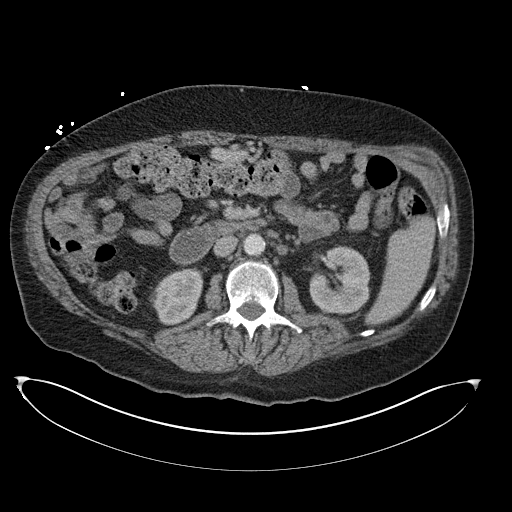
[im 78/117  soft-tissue]
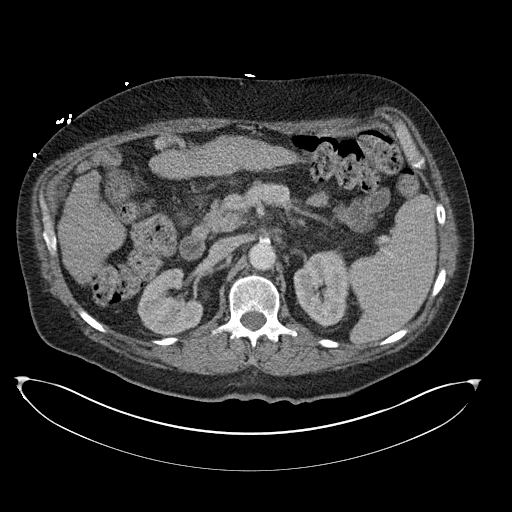
[im 78/117  bone]
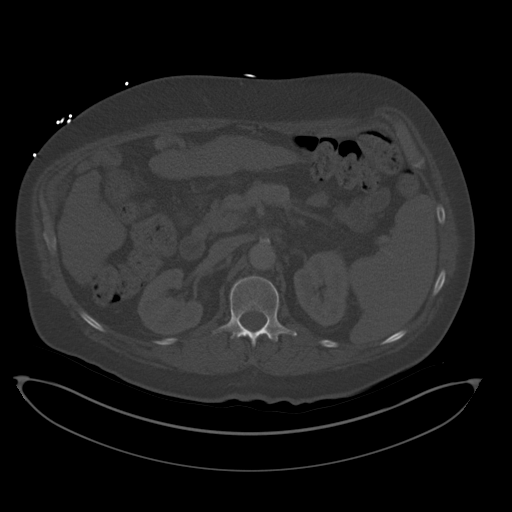
[im 86/117  soft-tissue]
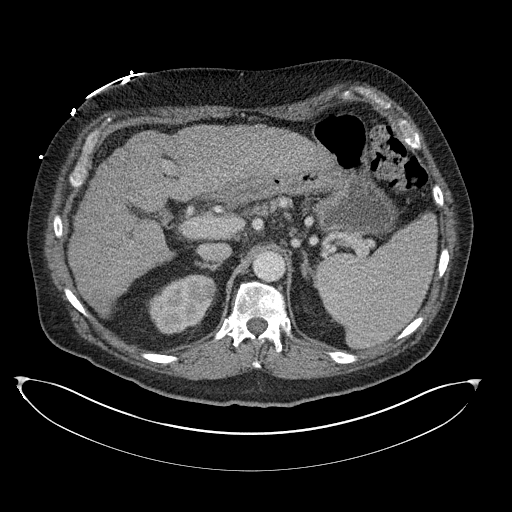
[im 93/117  soft-tissue]
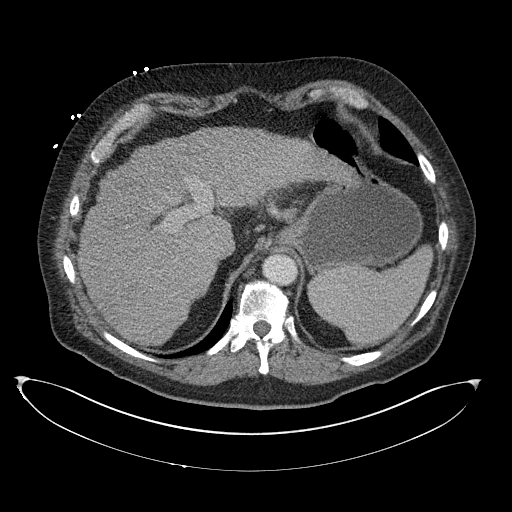
[im 101/117  soft-tissue]
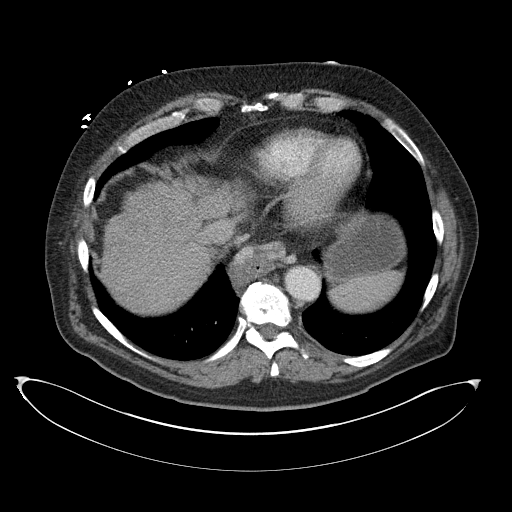
[im 109/117  soft-tissue]
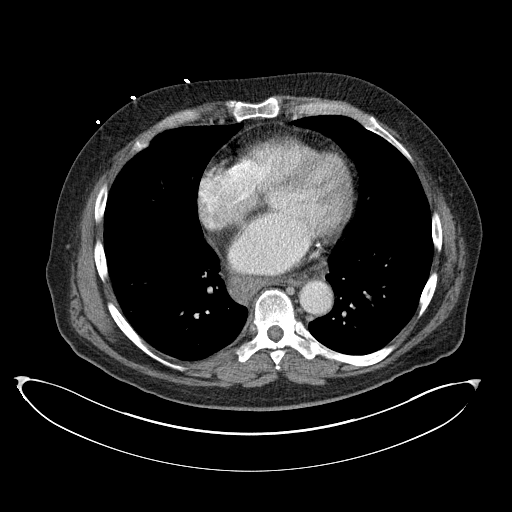

[Series 4: coronal st · coronal · 1.10mm/px · 3 of 166 slices shown]
[im 56/166  soft-tissue]
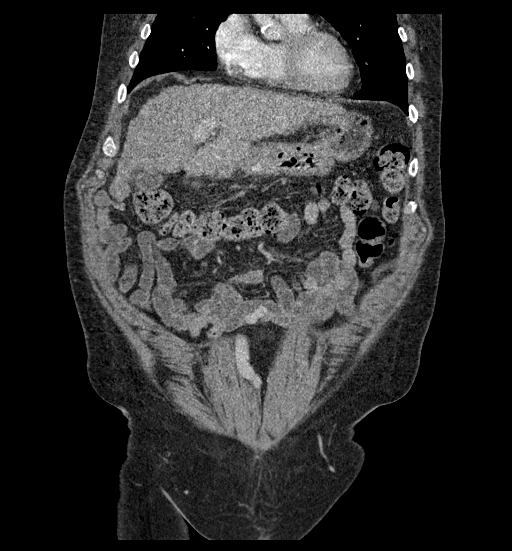
[im 74/166  soft-tissue]
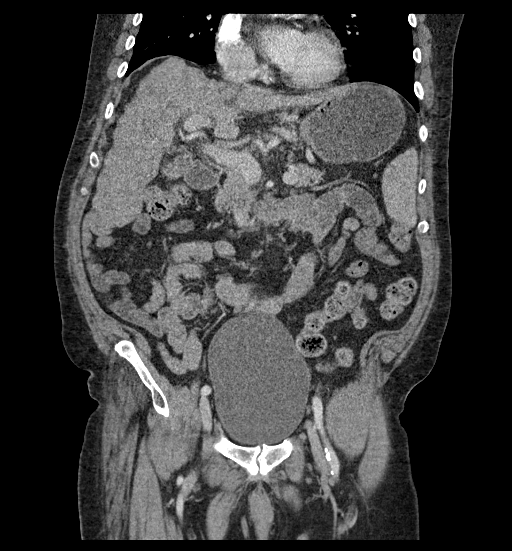
[im 92/166  soft-tissue]
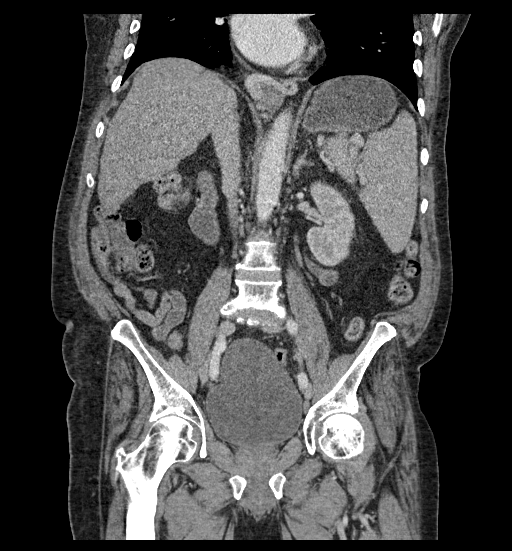

[16 of 46 positions shown; findings below may reference images not displayed]

RADIATION DOSE REDUCTION: This exam was performed according to the
departmental dose-optimization program which includes automated
exposure control, adjustment of the mA and/or kV according to
patient size and/or use of iterative reconstruction technique.

CONTRAST:  100mL OMNIPAQUE IOHEXOL 300 MG/ML  SOLN
FINDINGS: Lower chest: Imaged lung bases are clear. Coronary artery
calcifications are noted. The imaged heart is otherwise
unremarkable.

Hepatobiliary: The liver is shrunken with a nodular surface contour
consistent with cirrhosis. No focal lesions are seen on this
single-phase study. The gallbladder is unremarkable. There is no
biliary

Pancreas: Ductal dilatation.  Unremarkable.

Spleen: The spleen is enlarged measuring up to 18.7 cm cc. There are
no focal lesions.

Adrenals/Urinary Tract: The adrenals are unremarkable.

There is a 1.8 cm right upper pole renal cyst, unchanged. A
subcentimeter lesion in the right lower pole likely also reflects a
small cyst. There are no other focal lesions. There are no stones.
There is no hydronephrosis or hydroureter. There is symmetric
excretion of contrast into the collecting systems on the delayed
images.

Stomach/Bowel: There is thickening of the lower esophageal wall,
similar to the prior study from 10/10/2020. Stomach is unremarkable.
There is no evidence of bowel obstruction. There is no abnormal
bowel wall thickening or inflammatory change.

Vascular/Lymphatic: There is scattered calcified atherosclerotic
plaque in the nonaneurysmal abdominal aorta. The major branch
vessels are patent. The main portal and splenic veins are patent,
prominent in caliber. Paraesophageal and upper abdominal varices are
seen. There is recanalization of the umbilical vein which extends
the lower anterior abdomen, overall unchanged.

A 1.1 cm lymph node posterior to the GE junction is not
significantly changed (2-28). There is no new or progressive
lymphadenopathy in the abdomen or pelvis.

Reproductive: The prostate and seminal vesicles are unremarkable.

Other: There is no ascites or free air.

Musculoskeletal: There is no acute osseous abnormality or aggressive
osseous lesion. There is grade 1 anterolisthesis of L4 on L5 with
associated pars defects, unchanged.
IMPRESSION: 1. No acute findings in the abdomen or pelvis.
2. Cirrhotic morphology of the liver with imaging stigmata of portal
hypertension as above.
3. Unchanged thickening of the lower esophageal wall which may
reflect esophagitis, though malignancy again cannot be entirely
excluded. Correlate with endoscopy as indicated.

Aortic Atherosclerosis ([id number]-[id number].D).

## 2024-03-24 ENCOUNTER — Inpatient Hospital Stay (HOSPITAL_COMMUNITY)
Admission: EM | Admit: 2024-03-24 | Discharge: 2024-03-28 | DRG: 871 | Discharge status: Against Medical Advice | Attending: Family Medicine | Admitting: Family Medicine

## 2024-03-24 ENCOUNTER — Inpatient Hospital Stay (HOSPITAL_COMMUNITY)

## 2024-03-24 ENCOUNTER — Emergency Department (HOSPITAL_COMMUNITY)

## 2024-03-24 ENCOUNTER — Other Ambulatory Visit: Payer: Self-pay

## 2024-03-24 ENCOUNTER — Encounter (HOSPITAL_COMMUNITY): Payer: Self-pay

## 2024-03-24 DIAGNOSIS — F1010 Alcohol abuse, uncomplicated: Secondary | ICD-10-CM | POA: Diagnosis present

## 2024-03-24 DIAGNOSIS — D6959 Other secondary thrombocytopenia: Secondary | ICD-10-CM | POA: Diagnosis present

## 2024-03-24 DIAGNOSIS — R9431 Abnormal electrocardiogram [ECG] [EKG]: Secondary | ICD-10-CM | POA: Diagnosis present

## 2024-03-24 DIAGNOSIS — Z89511 Acquired absence of right leg below knee: Secondary | ICD-10-CM | POA: Diagnosis not present

## 2024-03-24 DIAGNOSIS — K7030 Alcoholic cirrhosis of liver without ascites: Secondary | ICD-10-CM | POA: Diagnosis present

## 2024-03-24 DIAGNOSIS — Z7901 Long term (current) use of anticoagulants: Secondary | ICD-10-CM

## 2024-03-24 DIAGNOSIS — Z833 Family history of diabetes mellitus: Secondary | ICD-10-CM

## 2024-03-24 DIAGNOSIS — Z1152 Encounter for screening for COVID-19: Secondary | ICD-10-CM

## 2024-03-24 DIAGNOSIS — K2100 Gastro-esophageal reflux disease with esophagitis, without bleeding: Secondary | ICD-10-CM | POA: Diagnosis present

## 2024-03-24 DIAGNOSIS — E876 Hypokalemia: Secondary | ICD-10-CM | POA: Diagnosis present

## 2024-03-24 DIAGNOSIS — A4102 Sepsis due to Methicillin resistant Staphylococcus aureus: Principal | ICD-10-CM | POA: Diagnosis present

## 2024-03-24 DIAGNOSIS — G9341 Metabolic encephalopathy: Secondary | ICD-10-CM | POA: Diagnosis present

## 2024-03-24 DIAGNOSIS — R296 Repeated falls: Secondary | ICD-10-CM | POA: Diagnosis present

## 2024-03-24 DIAGNOSIS — Z993 Dependence on wheelchair: Secondary | ICD-10-CM

## 2024-03-24 DIAGNOSIS — B9562 Methicillin resistant Staphylococcus aureus infection as the cause of diseases classified elsewhere: Secondary | ICD-10-CM | POA: Diagnosis not present

## 2024-03-24 DIAGNOSIS — Z87828 Personal history of other (healed) physical injury and trauma: Secondary | ICD-10-CM | POA: Diagnosis present

## 2024-03-24 DIAGNOSIS — R338 Other retention of urine: Secondary | ICD-10-CM | POA: Diagnosis present

## 2024-03-24 DIAGNOSIS — N401 Enlarged prostate with lower urinary tract symptoms: Secondary | ICD-10-CM | POA: Diagnosis present

## 2024-03-24 DIAGNOSIS — I739 Peripheral vascular disease, unspecified: Secondary | ICD-10-CM | POA: Diagnosis present

## 2024-03-24 DIAGNOSIS — L03116 Cellulitis of left lower limb: Secondary | ICD-10-CM | POA: Diagnosis present

## 2024-03-24 DIAGNOSIS — I480 Paroxysmal atrial fibrillation: Secondary | ICD-10-CM | POA: Diagnosis present

## 2024-03-24 DIAGNOSIS — F1994 Other psychoactive substance use, unspecified with psychoactive substance-induced mood disorder: Secondary | ICD-10-CM | POA: Diagnosis present

## 2024-03-24 DIAGNOSIS — A419 Sepsis, unspecified organism: Secondary | ICD-10-CM | POA: Diagnosis not present

## 2024-03-24 DIAGNOSIS — E66811 Obesity, class 1: Secondary | ICD-10-CM | POA: Diagnosis present

## 2024-03-24 DIAGNOSIS — S065X0A Traumatic subdural hemorrhage without loss of consciousness, initial encounter: Secondary | ICD-10-CM

## 2024-03-24 DIAGNOSIS — D696 Thrombocytopenia, unspecified: Secondary | ICD-10-CM | POA: Diagnosis not present

## 2024-03-24 DIAGNOSIS — Z86711 Personal history of pulmonary embolism: Secondary | ICD-10-CM

## 2024-03-24 DIAGNOSIS — E119 Type 2 diabetes mellitus without complications: Secondary | ICD-10-CM

## 2024-03-24 DIAGNOSIS — Z79899 Other long term (current) drug therapy: Secondary | ICD-10-CM | POA: Diagnosis not present

## 2024-03-24 DIAGNOSIS — W050XXA Fall from non-moving wheelchair, initial encounter: Secondary | ICD-10-CM | POA: Diagnosis present

## 2024-03-24 DIAGNOSIS — G9340 Encephalopathy, unspecified: Secondary | ICD-10-CM

## 2024-03-24 DIAGNOSIS — Z89512 Acquired absence of left leg below knee: Secondary | ICD-10-CM | POA: Diagnosis not present

## 2024-03-24 DIAGNOSIS — I4891 Unspecified atrial fibrillation: Secondary | ICD-10-CM | POA: Diagnosis not present

## 2024-03-24 DIAGNOSIS — I10 Essential (primary) hypertension: Secondary | ICD-10-CM | POA: Diagnosis present

## 2024-03-24 DIAGNOSIS — F411 Generalized anxiety disorder: Secondary | ICD-10-CM | POA: Diagnosis present

## 2024-03-24 DIAGNOSIS — S065XAA Traumatic subdural hemorrhage with loss of consciousness status unknown, initial encounter: Secondary | ICD-10-CM | POA: Diagnosis present

## 2024-03-24 DIAGNOSIS — R7881 Bacteremia: Secondary | ICD-10-CM | POA: Diagnosis not present

## 2024-03-24 DIAGNOSIS — E872 Acidosis, unspecified: Secondary | ICD-10-CM | POA: Diagnosis present

## 2024-03-24 DIAGNOSIS — F17210 Nicotine dependence, cigarettes, uncomplicated: Secondary | ICD-10-CM | POA: Diagnosis present

## 2024-03-24 DIAGNOSIS — S022XXA Fracture of nasal bones, initial encounter for closed fracture: Secondary | ICD-10-CM | POA: Diagnosis present

## 2024-03-24 DIAGNOSIS — R6520 Severe sepsis without septic shock: Secondary | ICD-10-CM | POA: Diagnosis present

## 2024-03-24 DIAGNOSIS — E1165 Type 2 diabetes mellitus with hyperglycemia: Secondary | ICD-10-CM | POA: Diagnosis present

## 2024-03-24 DIAGNOSIS — Y9289 Other specified places as the place of occurrence of the external cause: Secondary | ICD-10-CM | POA: Diagnosis not present

## 2024-03-24 DIAGNOSIS — Z8249 Family history of ischemic heart disease and other diseases of the circulatory system: Secondary | ICD-10-CM

## 2024-03-24 DIAGNOSIS — Z720 Tobacco use: Secondary | ICD-10-CM | POA: Diagnosis present

## 2024-03-24 DIAGNOSIS — Z5900 Homelessness unspecified: Secondary | ICD-10-CM | POA: Diagnosis not present

## 2024-03-24 DIAGNOSIS — I6200 Nontraumatic subdural hemorrhage, unspecified: Secondary | ICD-10-CM

## 2024-03-24 DIAGNOSIS — Z6833 Body mass index (BMI) 33.0-33.9, adult: Secondary | ICD-10-CM

## 2024-03-24 DIAGNOSIS — N400 Enlarged prostate without lower urinary tract symptoms: Secondary | ICD-10-CM | POA: Diagnosis present

## 2024-03-24 LAB — CBC WITH DIFFERENTIAL/PLATELET
Abs Granulocyte: 15.8 K/uL — ABNORMAL HIGH (ref 1.5–6.5)
Abs Immature Granulocytes: 0.15 K/uL — ABNORMAL HIGH (ref 0.00–0.07)
Basophils Absolute: 0.1 K/uL (ref 0.0–0.1)
Basophils Relative: 0 %
Eosinophils Absolute: 0 K/uL (ref 0.0–0.5)
Eosinophils Relative: 0 %
HCT: 36.2 % — ABNORMAL LOW (ref 39.0–52.0)
Hemoglobin: 12.4 g/dL — ABNORMAL LOW (ref 13.0–17.0)
Immature Granulocytes: 1 %
Lymphocytes Relative: 2 %
Lymphs Abs: 0.4 K/uL — ABNORMAL LOW (ref 0.7–4.0)
MCH: 33.3 pg (ref 26.0–34.0)
MCHC: 34.3 g/dL (ref 30.0–36.0)
MCV: 97.3 fL (ref 80.0–100.0)
Monocytes Absolute: 1.1 K/uL — ABNORMAL HIGH (ref 0.1–1.0)
Monocytes Relative: 6 %
Neutro Abs: 15.8 K/uL — ABNORMAL HIGH (ref 1.7–7.7)
Neutrophils Relative %: 91 %
Platelets: 78 K/uL — ABNORMAL LOW (ref 150–400)
RBC: 3.72 MIL/uL — ABNORMAL LOW (ref 4.22–5.81)
RDW: 13.9 % (ref 11.5–15.5)
Smear Review: NORMAL
WBC: 17.5 K/uL — ABNORMAL HIGH (ref 4.0–10.5)
nRBC: 0 % (ref 0.0–0.2)

## 2024-03-24 LAB — RESP PANEL BY RT-PCR (RSV, FLU A&B, COVID)  RVPGX2
Influenza A by PCR: NEGATIVE
Influenza B by PCR: NEGATIVE
Resp Syncytial Virus by PCR: NEGATIVE
SARS Coronavirus 2 by RT PCR: NEGATIVE

## 2024-03-24 LAB — PROCALCITONIN: Procalcitonin: 3.65 ng/mL

## 2024-03-24 LAB — COMPREHENSIVE METABOLIC PANEL WITH GFR
ALT: 16 U/L (ref 0–44)
AST: 32 U/L (ref 15–41)
Albumin: 3.2 g/dL — ABNORMAL LOW (ref 3.5–5.0)
Alkaline Phosphatase: 86 U/L (ref 38–126)
Anion gap: 12 (ref 5–15)
BUN: 12 mg/dL (ref 8–23)
CO2: 22 mmol/L (ref 22–32)
Calcium: 8.8 mg/dL — ABNORMAL LOW (ref 8.9–10.3)
Chloride: 101 mmol/L (ref 98–111)
Creatinine, Ser: 0.97 mg/dL (ref 0.61–1.24)
GFR, Estimated: >60 mL/min (ref >60)
Glucose, Bld: 198 mg/dL — ABNORMAL HIGH (ref 70–99)
Potassium: 3.3 mmol/L — ABNORMAL LOW (ref 3.5–5.1)
Sodium: 135 mmol/L (ref 135–145)
Total Bilirubin: 2.9 mg/dL — ABNORMAL HIGH (ref 0.0–1.2)
Total Protein: 6.6 g/dL (ref 6.5–8.1)

## 2024-03-24 LAB — GLUCOSE, CAPILLARY: Glucose-Capillary: 139 mg/dL — ABNORMAL HIGH (ref 70–99)

## 2024-03-24 LAB — MRSA NEXT GEN BY PCR, NASAL: MRSA by PCR Next Gen: NOT DETECTED

## 2024-03-24 LAB — C-REACTIVE PROTEIN: CRP: 5.8 mg/dL — ABNORMAL HIGH (ref <1.0)

## 2024-03-24 LAB — HEMOGLOBIN A1C
Hgb A1c MFr Bld: 5.7 % — ABNORMAL HIGH (ref 4.8–5.6)
Mean Plasma Glucose: 116.89 mg/dL

## 2024-03-24 LAB — LACTIC ACID, PLASMA
Lactic Acid, Venous: 2.3 mmol/L (ref 0.5–1.9)
Lactic Acid, Venous: 2.3 mmol/L (ref 0.5–1.9)

## 2024-03-24 LAB — PROTIME-INR
INR: 1.3 — ABNORMAL HIGH (ref 0.8–1.2)
Prothrombin Time: 16.9 s — ABNORMAL HIGH (ref 11.4–15.2)

## 2024-03-24 LAB — I-STAT CG4 LACTIC ACID, ED: Lactic Acid, Venous: 2.4 mmol/L (ref 0.5–1.9)

## 2024-03-24 LAB — AMMONIA: Ammonia: 31 umol/L (ref 9–35)

## 2024-03-24 LAB — HIV ANTIBODY (ROUTINE TESTING W REFLEX): HIV Screen 4th Generation wRfx: NONREACTIVE

## 2024-03-24 MED ORDER — ACETAMINOPHEN 325 MG PO TABS
ORAL_TABLET | ORAL | Status: AC
  Administered 2024-03-24: 650 mg via ORAL
  Filled 2024-03-24: qty 2

## 2024-03-24 MED ORDER — LEVETIRACETAM (KEPPRA) 500 MG/5 ML ADULT IV PUSH
500.0000 mg | Freq: Two times a day (BID) | INTRAVENOUS | Status: DC
  Administered 2024-03-24 – 2024-03-25 (×2): 500 mg via INTRAVENOUS
  Filled 2024-03-24 (×2): qty 5

## 2024-03-24 MED ORDER — THIAMINE HCL 100 MG/ML IJ SOLN
100.0000 mg | Freq: Every day | INTRAMUSCULAR | Status: DC
  Administered 2024-03-24: 100 mg via INTRAVENOUS
  Filled 2024-03-24: qty 2

## 2024-03-24 MED ORDER — ADULT MULTIVITAMIN W/MINERALS CH
1.0000 | ORAL_TABLET | Freq: Every day | ORAL | Status: DC
  Administered 2024-03-24 – 2024-03-27 (×4): 1 via ORAL
  Filled 2024-03-24 (×4): qty 1

## 2024-03-24 MED ORDER — DOCUSATE SODIUM 100 MG PO CAPS: 100.0000 mg | ORAL_CAPSULE | Freq: Two times a day (BID) | ORAL | Status: DC | PRN

## 2024-03-24 MED ORDER — ACETAMINOPHEN 325 MG PO TABS: 650.0000 mg | ORAL_TABLET | Freq: Four times a day (QID) | ORAL | Status: DC | PRN

## 2024-03-24 MED ORDER — VANCOMYCIN HCL 1750 MG/350ML IV SOLN
1750.0000 mg | Freq: Once | INTRAVENOUS | Status: AC
  Administered 2024-03-24: 1750 mg via INTRAVENOUS
  Filled 2024-03-24: qty 350

## 2024-03-24 MED ORDER — ACETAMINOPHEN 325 MG PO TABS
650.0000 mg | ORAL_TABLET | Freq: Once | ORAL | Status: AC
  Administered 2024-03-24: 650 mg via ORAL
  Filled 2024-03-24: qty 2

## 2024-03-24 MED ORDER — PIPERACILLIN-TAZOBACTAM 3.375 G IVPB 30 MIN
3.3750 g | Freq: Once | INTRAVENOUS | Status: AC
  Administered 2024-03-24: 3.375 g via INTRAVENOUS
  Filled 2024-03-24: qty 50

## 2024-03-24 MED ORDER — DIAZEPAM 5 MG/ML IJ SOLN
0.0000 mg | Freq: Four times a day (QID) | INTRAMUSCULAR | Status: DC | PRN
  Administered 2024-03-24: 10 mg via INTRAVENOUS
  Filled 2024-03-24: qty 4

## 2024-03-24 MED ORDER — THIAMINE MONONITRATE 100 MG PO TABS
100.0000 mg | ORAL_TABLET | Freq: Every day | ORAL | Status: DC
  Administered 2024-03-25 – 2024-03-27 (×3): 100 mg via ORAL
  Filled 2024-03-24 (×4): qty 1

## 2024-03-24 MED ORDER — PIPERACILLIN-TAZOBACTAM 3.375 G IVPB
3.3750 g | Freq: Three times a day (TID) | INTRAVENOUS | Status: DC
  Administered 2024-03-24 – 2024-03-25 (×2): 3.375 g via INTRAVENOUS
  Filled 2024-03-24 (×2): qty 50

## 2024-03-24 MED ORDER — INSULIN ASPART 100 UNIT/ML IJ SOLN
0.0000 [IU] | INTRAMUSCULAR | Status: DC
  Administered 2024-03-24: 3 [IU] via SUBCUTANEOUS
  Administered 2024-03-25: 1 [IU] via SUBCUTANEOUS
  Administered 2024-03-25: 2 [IU] via SUBCUTANEOUS
  Administered 2024-03-25 (×2): 1 [IU] via SUBCUTANEOUS

## 2024-03-24 MED ORDER — FOLIC ACID 1 MG PO TABS
1.0000 mg | ORAL_TABLET | Freq: Every day | ORAL | Status: DC
  Administered 2024-03-24 – 2024-03-27 (×4): 1 mg via ORAL
  Filled 2024-03-24 (×4): qty 1

## 2024-03-24 MED ORDER — LACTATED RINGERS IV BOLUS
1000.0000 mL | Freq: Once | INTRAVENOUS | Status: AC
  Administered 2024-03-24: 1000 mL via INTRAVENOUS

## 2024-03-24 MED ORDER — CHLORHEXIDINE GLUCONATE CLOTH 2 % EX PADS
6.0000 | MEDICATED_PAD | Freq: Every day | CUTANEOUS | Status: DC
  Administered 2024-03-24 – 2024-03-27 (×4): 6 via TOPICAL

## 2024-03-24 MED ORDER — POLYETHYLENE GLYCOL 3350 17 G PO PACK
17.0000 g | PACK | Freq: Every day | ORAL | Status: DC | PRN
  Administered 2024-03-26: 17 g via ORAL
  Filled 2024-03-24: qty 1

## 2024-03-24 MED ORDER — DIAZEPAM 5 MG PO TABS: 0.0000 mg | ORAL_TABLET | Freq: Four times a day (QID) | ORAL | Status: DC | PRN

## 2024-03-24 MED ORDER — VANCOMYCIN HCL IN DEXTROSE 1-5 GM/200ML-% IV SOLN
1000.0000 mg | Freq: Two times a day (BID) | INTRAVENOUS | Status: DC
  Administered 2024-03-25 – 2024-03-27 (×5): 1000 mg via INTRAVENOUS
  Filled 2024-03-24 (×7): qty 200

## 2024-03-24 MED ORDER — ONDANSETRON HCL 4 MG/2ML IJ SOLN
4.0000 mg | Freq: Four times a day (QID) | INTRAMUSCULAR | Status: DC | PRN
  Administered 2024-03-24: 4 mg via INTRAVENOUS
  Filled 2024-03-24: qty 2

## 2024-03-24 NOTE — ED Triage Notes (Signed)
 BIB [facility] from [facility] st after falling out of wheelchair. No head injury, shoulder pain & R sided abd lac. 101.4 Oral. L stump possibly infected.  120-140s Tachy.  30-40s RR 282 CBG

## 2024-03-24 NOTE — ED Provider Notes (Signed)
 [facility] Provider Note   CSN: [id number] Arrival date & time: 03/24/24  1341     Patient presents with: Fall and Altered Mental Status   Cory Palmer is a 63 y.o. male w hx of bipolar disorder, bilateral lower extremity amputations, cirrhosis, parox A Fib, HTN, here by EMS with concern for fever, confusion, and fall from wheelchair. Patient seen several times in ED over past few months with fall out of wheelchair and is noted to be homeless.  Today EMS reports temp 101F and tachycardic (possible A Fib) en route to the hospital.  No meds given by EMS.     HPI     Prior to Admission medications   Medication Sig Start Date End Date Taking? Authorizing Provider  QUEtiapine  (SEROQUEL ) 300 MG tablet Take 1 tablet (300 mg total) by mouth at bedtime. 09/06/23  Yes Mannie Jerel PARAS, NP  traZODone  (DESYREL ) 50 MG tablet Take 1 tablet (50 mg total) by mouth at bedtime. 09/06/23  Yes Mannie Jerel PARAS, NP  apixaban  (ELIQUIS ) 5 MG TABS tablet Take 1 tablet (5 mg total) by mouth 2 (two) times daily. Patient not taking: Reported on 08/03/2023 06/15/23   Victor Agent T, PA-C  APIXABAN  (ELIQUIS ) VTE STARTER PACK (10MG  AND 5MG ) Take as directed on package: start with two-5mg  tablets twice daily for 7 days. On day 8, switch to one-5mg  tablet twice daily. Patient not taking: Reported on 09/06/2023 01/16/23   Jerral Meth, MD  metFORMIN  (GLUCOPHAGE ) 500 MG tablet Take 1 tablet (500 mg total) by mouth 2 (two) times daily with a meal. Patient not taking: Reported on 03/24/2024 01/16/23   Jerral Meth, MD  nystatin  (MYCOSTATIN ) 100000 UNIT/ML suspension Take 5 mLs (500,000 Units total) by mouth 4 (four) times daily. Continue until symptoms resolve, then continue for 48 hours after. Patient not taking: Reported on 03/24/2024 10/22/23   Roemhildt, Lorin T, PA-C    Allergies: Atorvastatin calcium , Gemfibrozil, and Insulin  glargine    Review of Systems  Updated  Vital Signs BP 131/68   Pulse (!) 32   Temp (!) 101.9 F (38.8 C) (Oral)   Resp (!) 29   SpO2 93%   Physical Exam Constitutional:      General: He is not in acute distress.    Comments: Disheveled appearance  HENT:     Head: Normocephalic and atraumatic.  Eyes:     Conjunctiva/sclera: Conjunctivae normal.     Pupils: Pupils are equal, round, and reactive to light.  Cardiovascular:     Rate and Rhythm: Regular rhythm. Tachycardia present.  Pulmonary:     Effort: Pulmonary effort is normal. No respiratory distress.  Abdominal:     General: There is no distension.     Tenderness: There is no abdominal tenderness.  Musculoskeletal:     Comments: Bilateral lower extremity amputations with scabbed ulcerations near stump sites, left stump warmer than right  Skin:    General: Skin is warm and dry.  Neurological:     General: No focal deficit present.     Mental Status: He is alert. Mental status is at baseline.     (all labs ordered are listed, but only abnormal results are displayed) Labs Reviewed  COMPREHENSIVE METABOLIC PANEL WITH GFR - Abnormal; Notable for the following components:      Result Value   Potassium 3.3 (*)    Glucose, Bld 198 (*)    Calcium  8.8 (*)    Albumin 3.2 (*)  Total Bilirubin 2.9 (*)    All other components within normal limits  CBC WITH DIFFERENTIAL/PLATELET - Abnormal; Notable for the following components:   WBC 17.5 (*)    RBC 3.72 (*)    Hemoglobin 12.4 (*)    HCT 36.2 (*)    Platelets 78 (*)    Neutro Abs 15.8 (*)    Lymphs Abs 0.4 (*)    Monocytes Absolute 1.1 (*)    Abs Immature Granulocytes 0.15 (*)    Abs Granulocyte 15.8 (*)    All other components within normal limits  PROTIME-INR - Abnormal; Notable for the following components:   Prothrombin Time 16.9 (*)    INR 1.3 (*)    All other components within normal limits  HEMOGLOBIN A1C - Abnormal; Notable for the following components:   Hgb A1c MFr Bld 5.7 (*)    All other  components within normal limits  I-STAT CG4 LACTIC ACID, ED - Abnormal; Notable for the following components:   Lactic Acid, Venous 2.4 (*)    All other components within normal limits  RESP PANEL BY RT-PCR (RSV, FLU A&B, COVID)  RVPGX2  CULTURE, BLOOD (ROUTINE X 2)  CULTURE, BLOOD (ROUTINE X 2)  MRSA NEXT GEN BY PCR, NASAL  AMMONIA  URINALYSIS, W/ REFLEX TO CULTURE (INFECTION SUSPECTED)  HIV ANTIBODY (ROUTINE TESTING W REFLEX)  CBC  BASIC METABOLIC PANEL WITH GFR  MAGNESIUM   PROCALCITONIN  C-REACTIVE PROTEIN  LACTIC ACID, PLASMA  LACTIC ACID, PLASMA  URINALYSIS, ROUTINE W REFLEX MICROSCOPIC  RAPID URINE DRUG SCREEN, HOSP PERFORMED  I-STAT CG4 LACTIC ACID, ED    EKG: EKG Interpretation Date/Time:  Sunday March 24 2024 14:25:32 EDT Ventricular Rate:  124 PR Interval:    QRS Duration:  119 QT Interval:  312 QTC Calculation: 449 R Axis:   114  Text Interpretation: Atrial fibrillation Nonspecific intraventricular conduction delay Low voltage, precordial leads Minimal ST depression, anterolateral leads Confirmed by Cottie Cough [id number]) on 03/24/2024 2:48:31 PM  Radiology: CT Head Wo Contrast Result Date: 03/24/2024 CLINICAL DATA:  Provided history: Mental status change, unknown cause. EXAM: CT HEAD WITHOUT CONTRAST TECHNIQUE: Contiguous axial images were obtained from the base of the skull through the vertex without intravenous contrast. RADIATION DOSE REDUCTION: This exam was performed according to the departmental dose-optimization program which includes automated exposure control, adjustment of the mA and/or kV according to patient size and/or use of iterative reconstruction technique. COMPARISON:  Head CT 01/10/2024. FINDINGS: Brain: Mild generalized cerebral atrophy. Subdural hematomas along the bilateral cerebral convexities have increased in size since the head CT of 01/10/2024. The subdural hematoma on the right now measures up to 14 mm in thickness (previously 4 mm). The  subdural hematoma on the left now measures up to 11 mm in thickness (previously 5 mm). The hematomas are mixed density and there are new hyperdense components (consistent with acute hemorrhage) within both collections. Balanced mass effect upon the underlying cerebral hemispheres. No midline shift or significant ventricular effacement. Additional 14 mm ovoid focus of acute hemorrhage overlying the high posterior left frontal lobe, which is also favored subdural in location (for instance as seen on series 4, image 43). No demarcated cortical infarct. No evidence of an intracranial mass. Vascular: No hyperdense vessel.  Atherosclerotic calcifications. Skull: No calvarial fracture or aggressive osseous lesion. Sinuses/Orbits: No mass or acute finding within the imaged orbits. Displaced fractures of the bilateral nasal bones, anterior wall of the left maxillary sinus and left inferior orbital rim, new from the  prior head CT of 01/10/2024. These results were called by telephone at the time of interpretation on 03/24/2024 at 3:26 pm to provider [facility] , who verbally acknowledged these results. IMPRESSION: 1. Interval increase in size of subdural hematomas overlying the bilateral cerebral hemispheres (now measuring up to 14 mm in thickness on the right and 11 mm in thickness on the left). The subdural hematomas are mixed density and there are new hyperdense components (consistent with acute hemorrhage) within both collections. Balanced mass effect upon the underlying cerebral hemispheres. No midline shift. 2. Additional 14 mm ovoid focus of acute hemorrhage overlying the high posterior left frontal lobe, which is also favored subdural in location 3. Displaced fractures of the bilateral nasal bones, anterior wall of the left maxillary sinus and left inferior orbital rim, new from the prior head CT of 01/10/2024. 4. Mild generalized cerebral atrophy. Electronically Signed   By: Rockey Childs D.O.   On: 03/24/2024 15:28    DG Chest Port 1 View Result Date: 03/24/2024 CLINICAL DATA:  Questionable sepsis - evaluate for abnormality EXAM: PORTABLE CHEST 1 VIEW COMPARISON:  01/05/2024. FINDINGS: Stable cardiomediastinal contours. Similar patchy scarring in the right upper lobe. No focal consolidation, pleural effusion, or pneumothorax. No acute osseous abnormality. IMPRESSION: No acute cardiopulmonary findings. Electronically Signed   By: Harrietta Sherry M.D.   On: 03/24/2024 14:59     .Ultrasound ED Peripheral IV (Provider)  Date/Time: 03/24/2024 3:24 PM  Performed by: Cottie Donnice PARAS, MD Authorized by: Cottie Donnice PARAS, MD   Procedure details:    Indications: hydration, hypotension, multiple failed IV attempts and poor IV access     Skin Prep: isopropyl alcohol     Location:  Left AC   Angiocath:  20 G   Bedside Ultrasound Guided: Yes     Images: not archived     Patient tolerated procedure without complications: Yes     Dressing applied: Yes   .Critical Care  Performed by: Cottie Donnice PARAS, MD Authorized by: Cottie Donnice PARAS, MD   Critical care provider statement:    Critical care time (minutes):  30   Critical care time was exclusive of:  Separately billable procedures and treating other patients   Critical care was necessary to treat or prevent imminent or life-threatening deterioration of the following conditions:  Sepsis   Critical care was time spent personally by me on the following activities:  Ordering and performing treatments and interventions, ordering and review of laboratory studies, ordering and review of radiographic studies, pulse oximetry, review of old charts, examination of patient and evaluation of patient's response to treatment    Medications Ordered in the ED  Chlorhexidine  Gluconate Cloth 2 % PADS 6 each (6 each Topical Given 03/24/24 1843)  docusate sodium  (COLACE) capsule 100 mg (has no administration in time range)  polyethylene glycol (MIRALAX  / GLYCOLAX ) packet 17 g  (has no administration in time range)  piperacillin -tazobactam (ZOSYN ) IVPB 3.375 g (has no administration in time range)  vancomycin  (VANCOCIN ) IVPB 1000 mg/200 mL premix (has no administration in time range)  thiamine  (VITAMIN B1) tablet 100 mg (has no administration in time range)    Or  thiamine  (VITAMIN B1) injection 100 mg (has no administration in time range)  folic acid  (FOLVITE ) tablet 1 mg (has no administration in time range)  multivitamin with minerals tablet 1 tablet (has no administration in time range)  insulin  aspart (novoLOG ) injection 0-9 Units (has no administration in time range)  levETIRAcetam  (KEPPRA ) undiluted injection  500 mg (has no administration in time range)  lactated ringers  bolus 1,000 mL (0 mLs Intravenous Stopped 03/24/24 1719)  acetaminophen  (TYLENOL ) tablet 650 mg (650 mg Oral Given 03/24/24 1542)  lactated ringers  bolus 1,000 mL (0 mLs Intravenous Paused 03/24/24 1728)  piperacillin -tazobactam (ZOSYN ) IVPB 3.375 g (0 g Intravenous Stopped 03/24/24 1643)  vancomycin  (VANCOREADY) IVPB 1750 mg/350 mL ( Intravenous Restarted 03/24/24 1808)    Clinical Course as of 03/24/24 1941  Sun Mar 24, 2024  1523 USIV placed [MT]  1527 Concern for subdural hematoma, acute per radiologist - also noting age indeterminate nasal fx and orbital wall fx (left) [MT]  1531 Patient reports that he has been taking Eliquis  but is foggy and nondescript with how much he takes or when he last took it.  He does not have any facial pain when I palpate along the orbital ridge, and so I suspect the nasal fracture and orbital wall fracture is likely chronic. [MT]  1532 Labs drawn - RN aware of need for temp, will get rectal temp [MT]  1603 NSGY recommends holding eliquis  and repeat CT head in 8 hours. [place]) [MT]  [phone] Hospitalist requests ICU consult.  I spoke to [facility] from ICU and consult placed [MT]    Clinical Course User Index [MT] Cloyd Ragas, Donnice PARAS, MD                                  Medical Decision Making Amount and/or Complexity of Data Reviewed Labs: ordered. Radiology: ordered.  Risk OTC drugs. Prescription drug management. Decision regarding hospitalization.   This patient presents to the ED with concern for fever, weakness, fall. This involves an extensive number of treatment options, and is a complaint that carries with it a high risk of complications and morbidity.  The differential diagnosis includes sepsis including skin infection vs UTI vs PNA vs other vs metabolic derangement vs cirrhosis complications vs other  Co-morbidities that complicate the patient evaluation: Diabetes, infection risk  Additional history obtained from EMS  External records from outside source obtained and reviewed including prior ED record  I ordered and personally interpreted labs.  The pertinent results include:  lactate 2.4, WBC 17.5, K 3.3, Cr wnl.    I ordered imaging studies including CT scan of the head, x-ray of the chest I independently visualized and interpreted imaging which showed subdural bleed I agree with the radiologist interpretation  The patient was maintained on a cardiac monitor.  I personally viewed and interpreted the cardiac monitored which showed an underlying rhythm of: A fib with HR 100-120 bpm  Per my interpretation the patient's ECG shows A Fib with RVR  I ordered medication including IV fluids per IBW sepsis protocol and BS antibiotics  I have reviewed the patients home medicines and have made adjustments as needed  Test Considered: doubt acute PE, meningitis  I requested consultation with the neurosurgery, critical care,  and discussed lab and imaging findings as well as pertinent plan - they recommend: see ed course  After the interventions noted above, I reevaluated the patient and found that they have: stayed the same  Suspected source of fever likely cellulitis of lower extremity.  Patient refusing catheter for urine, not cooperative  with providing sample.  Repeat 8 hour head CT scan ordered per discussion with neurosurgery.  Recommending HOLDING eliquis  at this time, but no emergent indication for reversal unless bleed enlarges or neuro status declines.  A Fib borderline Rvr  - will monitor now during fluid/volume resuscitation, avoid beta blockers until more stable.  Disposition:  After consideration of the diagnostic results and the patient's response to treatment, I feel that the patient would benefit from medical admission      Final diagnoses:  Sepsis, due to unspecified organism, unspecified whether acute organ dysfunction present [facility])    ED Discharge Orders     None          Jerami Tammen, Donnice PARAS, MD 03/24/24 [phone]

## 2024-03-24 NOTE — Progress Notes (Addendum)
 eLink Physician-Brief Progress Note Patient Name: PRINCETON NABOR DOB: [birth year] MRN: [id number]   Date of Service  03/24/2024  HPI/Events of Note  Complaining of nausea and dry heaving CIWA scoring every 6 hours, currently at 18 with no treatment protocol in place  eICU Interventions  Add Zofran  Add diazepam  trigger based therapy   2958 - Add Tylenol  as needed for fevers  2206 - Bout of emesis despite zofran  45 mins ago, no focal neuro deficits, hemodynamically stable, check KUB  2240 -no obvious radiographic maladies, continue observation for now.  Maintain n.p.o. repeat CT head is pending.  Intervention Category Minor Interventions: Routine modifications to care plan (e.g. PRN medications for pain, fever)  Claire Dolores 03/24/2024, 8:35 PM

## 2024-03-24 NOTE — Progress Notes (Signed)
 Pharmacy Antibiotic Note  Cory Palmer is a 63 y.o. male admitted on 03/24/2024 presenting with AMS, concern for sepsis.  Pharmacy has been consulted for vancomycin  and zosyn  dosing.  Plan: Vancomycin  1g IV q 12h (eAUC 483) Monitor renal function, Cx and clinical progression to narrow Vancomycin  levels as indicated     Temp (24hrs), Avg:103 F (39.4 C), Min:103 F (39.4 C), Max:103 F (39.4 C)  Recent Labs  Lab 03/24/24 1531 03/24/24 1546  WBC 17.5*  --   CREATININE 0.97  --   LATICACIDVEN  --  2.4*    CrCl cannot be calculated (Unknown ideal weight.).    Allergies  Allergen Reactions   Atorvastatin Calcium  Nausea And Vomiting   Gemfibrozil     Unknown   Insulin  Glargine     Unknown    Dorn Poot, PharmD, [facility] Clinical Pharmacist ED Pharmacist Phone # [phone] 03/24/2024 5:16 PM

## 2024-03-24 NOTE — Progress Notes (Signed)
 ED Pharmacy Antibiotic Sign Off An antibiotic consult was received from an ED provider for vancomycin  and zosyn  per pharmacy dosing for sepsis. A chart review was completed to assess appropriateness.   The following one time order(s) were placed:  Vancomycin  1750 mg IV x 1 Zosyn  3.375g IV x 1  Further antibiotic and/or antibiotic pharmacy consults should be ordered by the admitting provider if indicated.   Thank you for allowing pharmacy to be a part of this patient's care.   Dorn Poot, [facility]  Clinical Pharmacist 03/24/24 3:51 PM

## 2024-03-24 NOTE — H&P (Addendum)
 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 Was altered, febrile and presented as a code sepsis.  In ED started on abx, fluids. CT H obtained which showed progression of prior bilateral SDH from 12/2023 as well as a new 14mm L frontal lobe SDH  There is some question of if he takes eliquis  which had prebiously been prescribed. (In my interview he denies and hasn't taken in forever)  NSGY consulted and rec holding eliquis  and repeat CT H in 8 hr  PCCM to admit in this setting   Pertinent  Medical History  Etoh  Fq falls Bilat BKA  SDH/hygromas  Significant Hospital Events: Including procedures, antibiotic start and stop dates in addition to other pertinent events     Interim History / Subjective:  Getting abx   Objective    Blood pressure 131/68, temperature (!) 103 F (39.4 C), temperature source Rectal, resp. rate (!) 41.       No intake or output data in the 24 hours ending 03/24/24 1752 There were no vitals filed for this visit.  Examination: General: chronically and acutely ill M Neuro: awake, lethargic, not very participatory with questions. 5/5 BUE strength. PERRLA 3mm  HENT: [place] anicteric sclera pink mm  Lungs: even unlabored on RA  Cardiovascular: irir  Abdomen: soft  Extremities: Stumps with scabbing wounds + open sore on R. Erythema/warmth R stump > L. No crepitus   GU: defer  Resolved problem list   Assessment and Plan   Acute encephalopathy -at this point favor metabolic as no focal def + sepsis criteria but obviously also has intracranial process below -supportive care, tx underlying cause   Acute traumatic  SDH on subacute SDH  -progression in size of prior bilateral SDH (01/10/2024) now 14mm R 11mm L + new 14mm acute hemorrhage posterior L frontal lobe  -there is question of this being c/b eliquis  however pt reports to me he hasn't taken that in forever P -repeat CT H in  8 hrs as ordered -EM has spoken w NSGY -- will appreciate further recs when note is in  -SBP < 160  -neuro protective measures   Facial / nasal fractures  -new from CT in June but on evaluation dont seem acute -defer ENT engagement   Sepsis  -?cellulitis / wound infection vs alt etiology -bilat stump wounds POA -BLE exam not c/f NSSTI  P -Bcx UA  -vanc zosyn   -WOC consult  -CRP and PCT ordered   Elevated LA -trend  Hypokalemia -replace   Thrombocytopenia -follow cbc   EtOH use disorder -he can't tell me when last drink was  P -monitor for s/sx withdrawal deferring BZD at the moment -- would like to be notified if behavior change so we can help tease out if c/f Dts or if needs to go for CT H sooner than anticipated -micronutrient support   DM2 -metformin  at home, has allergies listed to some insulin  -need to clarify w pharmacy, acutely would like to do SSI. Looks like has tolerated in the past.  Frequent Falls -?polypharmacy playing a role  P -PT/OT and when appropriate review home meds  -  holding home meds for now    Best Practice (right click and Reselect all SmartList Selections daily)   Diet/type: NPO w/ oral meds DVT prophylaxis not indicated Pressure ulcer(s): present on admission  GI prophylaxis: N/A Lines: N/A Foley:  N/A Code Status:  full code Last date of multidisciplinary goals of care discussion [--]  Labs   CBC: Recent Labs  Lab 03/24/24 1531  WBC 17.5*  NEUTROABS 15.8*  HGB 12.4*  HCT 36.2*  MCV 97.3  PLT 78*    Basic Metabolic Panel: Recent Labs  Lab 03/24/24 1531  NA 135  K 3.3*  CL 101  CO2 22  GLUCOSE 198*  BUN 12  CREATININE 0.97  CALCIUM  8.8*    GFR: CrCl cannot be calculated (Unknown ideal weight.). Recent Labs  Lab 03/24/24 1531 03/24/24 1546  WBC 17.5*  --   LATICACIDVEN  --  2.4*    Liver Function Tests: Recent Labs  Lab 03/24/24 1531  AST 32  ALT 16  ALKPHOS 86  BILITOT 2.9*  PROT 6.6  ALBUMIN 3.2*   No results for input(s): LIPASE, AMYLASE in the last 168 hours. Recent Labs  Lab 03/24/24 1533  AMMONIA 31    ABG    Component Value Date/Time   TCO2 19 (L) 06/15/2023 1720     Coagulation Profile: Recent Labs  Lab 03/24/24 1531  INR 1.3*    Cardiac Enzymes: No results for input(s): CKTOTAL, CKMB, CKMBINDEX, TROPONINI in the last 168 hours.  HbA1C: Hemoglobin A1C  Date/Time Value Ref Range Status  12/28/2022 12:00 AM 6.6  Final   HbA1c, POC (controlled diabetic range)  Date/Time Value Ref Range Status  05/16/2022 09:14 AM 6.2 0.0 - 7.0 % Final   Hgb A1c MFr Bld  Date/Time Value Ref Range Status  08/26/2022 03:29 AM 6.5 (H) 4.8 - 5.6 % Final    Comment:    (NOTE) Pre diabetes:          5.7%-6.4%  Diabetes:              >6.4%  Glycemic control for   <7.0% adults with diabetes   11/12/2021 06:56 AM 6.5 (H) 4.8 - 5.6 % Final    Comment:    (NOTE) Pre diabetes:          5.7%-6.4%  Diabetes:              >6.4%  Glycemic control for   <7.0% adults with diabetes     CBG: No results for input(s): GLUCAP in the last 168 hours.  Review of Systems:   Review of Systems  Eyes:  Negative for blurred vision, double vision and photophobia.  Respiratory: Negative.    Musculoskeletal:  Positive for falls.  Skin:  Positive for rash.  Neurological:  Positive for headaches. Negative for dizziness, sensory change, speech change and focal weakness.  Psychiatric/Behavioral:  The patient is nervous/anxious and has insomnia.      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   Surgical History:   Past Surgical History:  Procedure Laterality Date   AMPUTATION Left 09/28/2018   Procedure: LEFT TRANSMETATARSAL AMPUTATION;  Surgeon: Harden Jerona GAILS, MD;  Location: [facility] OR;  Service: Orthopedics;  Laterality: Left;  AMPUTATION Left 10/23/2019   Procedure: AMPUTATION BELOW KNEE;  Surgeon: Harden Jerona GAILS, MD;  Location: [facility] OR;  Service: Orthopedics;  Laterality: Left;   BELOW KNEE LEG AMPUTATION Right    ESOPHAGOGASTRODUODENOSCOPY (EGD) WITH PROPOFOL  N/A 10/19/2020   Procedure: ESOPHAGOGASTRODUODENOSCOPY (EGD) WITH PROPOFOL ;  Surgeon: Kristie Lamprey, MD;  Location: [facility] ENDOSCOPY;  Service: Endoscopy;  Laterality: N/A;   TEE WITHOUT CARDIOVERSION N/A 10/25/2019   Procedure: TRANSESOPHAGEAL ECHOCARDIOGRAM (TEE);  Surgeon: Pietro Redell RAMAN, MD;  Location[facility] ENDOSCOPY;  Service: Cardiovascular;  Laterality: N/A;     Social History:   reports that he has been smoking cigarettes. He has been exposed to tobacco smoke. He has never used smokeless tobacco. He reports current alcohol use. He reports that he does not currently use drugs after having used the following drugs: Cocaine.   Family History:  His family history includes Diabetes Mellitus II in an other family member; Hypertension in an other family member.   Allergies Allergies  Allergen Reactions   Atorvastatin Calcium  Nausea And Vomiting   Gemfibrozil     Unknown   Insulin  Glargine     Unknown     Home Medications  Prior to Admission medications   Medication Sig Start Date End Date Taking? Authorizing Provider  QUEtiapine  (SEROQUEL ) 300 MG tablet Take 1 tablet (300 mg total) by mouth at bedtime. 09/06/23  Yes Mannie Jerel PARAS, NP  traZODone  (DESYREL ) 50 MG tablet Take 1 tablet (50  mg total) by mouth at bedtime. 09/06/23  Yes Mannie Jerel PARAS, NP  apixaban  (ELIQUIS ) 5 MG TABS tablet Take 1 tablet (5 mg total) by mouth 2 (two) times daily. Patient not taking: Reported on 08/03/2023 06/15/23   Victor Lynwood DASEN, PA-C  APIXABAN  (ELIQUIS ) VTE STARTER PACK (10MG  AND 5MG ) Take as directed on package: start with two-5mg  tablets twice daily for 7 days. On day 8, switch to one-5mg  tablet twice daily. Patient not taking: Reported on 09/06/2023 01/16/23   Jerral Meth, MD  metFORMIN  (GLUCOPHAGE ) 500 MG tablet Take 1 tablet (500 mg total) by mouth 2 (two) times daily with a meal. Patient not taking: Reported on 03/24/2024 01/16/23   Jerral Meth, MD  nystatin  (MYCOSTATIN ) 100000 UNIT/ML suspension Take 5 mLs (500,000 Units total) by mouth 4 (four) times daily. Continue until symptoms resolve, then continue for 48 hours after. Patient not taking: Reported on 03/24/2024 10/22/23   Roemhildt, Lorin T, PA-C     Critical care time: 55 min       CRITICAL CARE Performed by: Ronnald FORBES Gave   Total critical care time: 55 minutes  Critical care time was exclusive of separately billable procedures and treating other patients. Critical care was necessary to treat or prevent imminent or life-threatening deterioration.  Critical care was time spent personally by me on the following activities: development of treatment plan with patient and/or surrogate as well as nursing, discussions with consultants, evaluation of patient's response to treatment, examination of patient, obtaining history from patient or surrogate, ordering and performing treatments and interventions, ordering and review of laboratory studies, ordering and review of radiographic studies, pulse oximetry and re-evaluation of patient's condition.  Ronnald Gave MSN, AGACNP-BC [facility] Amion for pager  03/24/2024, 5:52 PM

## 2024-03-24 NOTE — Progress Notes (Signed)
 Patient restless in the bed, temp of 102.2, complaining of headache and nausea. CIWA 18. MD notifed. Orders for zofran , valium  and tylenol .   Patient with episode of vomiting about 45 minutes after zofran  given. MD aware. Abdominal x-ray ordered.

## 2024-03-25 ENCOUNTER — Inpatient Hospital Stay (HOSPITAL_COMMUNITY)

## 2024-03-25 DIAGNOSIS — R7881 Bacteremia: Secondary | ICD-10-CM

## 2024-03-25 DIAGNOSIS — B9562 Methicillin resistant Staphylococcus aureus infection as the cause of diseases classified elsewhere: Secondary | ICD-10-CM

## 2024-03-25 DIAGNOSIS — Z7901 Long term (current) use of anticoagulants: Secondary | ICD-10-CM

## 2024-03-25 DIAGNOSIS — I4891 Unspecified atrial fibrillation: Secondary | ICD-10-CM

## 2024-03-25 DIAGNOSIS — F1010 Alcohol abuse, uncomplicated: Secondary | ICD-10-CM

## 2024-03-25 DIAGNOSIS — S065X0A Traumatic subdural hemorrhage without loss of consciousness, initial encounter: Secondary | ICD-10-CM | POA: Diagnosis not present

## 2024-03-25 DIAGNOSIS — A419 Sepsis, unspecified organism: Secondary | ICD-10-CM | POA: Diagnosis not present

## 2024-03-25 LAB — URINALYSIS, ROUTINE W REFLEX MICROSCOPIC
Bilirubin Urine: NEGATIVE
Glucose, UA: NEGATIVE mg/dL
Hgb urine dipstick: NEGATIVE
Ketones, ur: NEGATIVE mg/dL
Leukocytes,Ua: NEGATIVE
Nitrite: NEGATIVE
Protein, ur: NEGATIVE mg/dL
Specific Gravity, Urine: 1.031 — ABNORMAL HIGH (ref 1.005–1.030)
pH: 5 (ref 5.0–8.0)

## 2024-03-25 LAB — COMPREHENSIVE METABOLIC PANEL WITH GFR
ALT: 14 U/L (ref 0–44)
AST: 26 U/L (ref 15–41)
Albumin: 2.6 g/dL — ABNORMAL LOW (ref 3.5–5.0)
Alkaline Phosphatase: 70 U/L (ref 38–126)
Anion gap: 7 (ref 5–15)
BUN: 15 mg/dL (ref 8–23)
CO2: 21 mmol/L — ABNORMAL LOW (ref 22–32)
Calcium: 8.2 mg/dL — ABNORMAL LOW (ref 8.9–10.3)
Chloride: 103 mmol/L (ref 98–111)
Creatinine, Ser: 0.85 mg/dL (ref 0.61–1.24)
GFR, Estimated: >60 mL/min (ref >60)
Glucose, Bld: 132 mg/dL — ABNORMAL HIGH (ref 70–99)
Potassium: 3.3 mmol/L — ABNORMAL LOW (ref 3.5–5.1)
Sodium: 131 mmol/L — ABNORMAL LOW (ref 135–145)
Total Bilirubin: 2.1 mg/dL — ABNORMAL HIGH (ref 0.0–1.2)
Total Protein: 5.7 g/dL — ABNORMAL LOW (ref 6.5–8.1)

## 2024-03-25 LAB — BLOOD CULTURE ID PANEL (REFLEXED) - BCID2

## 2024-03-25 LAB — GLUCOSE, CAPILLARY
Glucose-Capillary: 211 mg/dL — ABNORMAL HIGH (ref 70–99)
Glucose-Capillary: 113 mg/dL — ABNORMAL HIGH (ref 70–99)
Glucose-Capillary: 142 mg/dL — ABNORMAL HIGH (ref 70–99)
Glucose-Capillary: 115 mg/dL — ABNORMAL HIGH (ref 70–99)
Glucose-Capillary: 125 mg/dL — ABNORMAL HIGH (ref 70–99)
Glucose-Capillary: 170 mg/dL — ABNORMAL HIGH (ref 70–99)

## 2024-03-25 LAB — RAPID URINE DRUG SCREEN, HOSP PERFORMED
Amphetamines: NOT DETECTED
Barbiturates: NOT DETECTED
Benzodiazepines: POSITIVE — AB
Cocaine: NOT DETECTED
Opiates: NOT DETECTED
Tetrahydrocannabinol: NOT DETECTED

## 2024-03-25 LAB — CBC
HCT: 31.9 % — ABNORMAL LOW (ref 39.0–52.0)
Hemoglobin: 11.2 g/dL — ABNORMAL LOW (ref 13.0–17.0)
MCH: 33.9 pg (ref 26.0–34.0)
MCHC: 35.1 g/dL (ref 30.0–36.0)
MCV: 96.7 fL (ref 80.0–100.0)
Platelets: 61 K/uL — ABNORMAL LOW (ref 150–400)
RBC: 3.3 MIL/uL — ABNORMAL LOW (ref 4.22–5.81)
RDW: 14 % (ref 11.5–15.5)
WBC: 9.5 K/uL (ref 4.0–10.5)
nRBC: 0 % (ref 0.0–0.2)

## 2024-03-25 LAB — PROTIME-INR
INR: 1.6 — ABNORMAL HIGH (ref 0.8–1.2)
Prothrombin Time: 19.6 s — ABNORMAL HIGH (ref 11.4–15.2)

## 2024-03-25 LAB — MAGNESIUM
Magnesium: 1.3 mg/dL — ABNORMAL LOW (ref 1.7–2.4)
Magnesium: 2.1 mg/dL (ref 1.7–2.4)

## 2024-03-25 MED ORDER — MAGNESIUM SULFATE 2 GM/50ML IV SOLN
2.0000 g | Freq: Once | INTRAVENOUS | Status: AC
  Administered 2024-03-25: 2 g via INTRAVENOUS
  Filled 2024-03-25: qty 50

## 2024-03-25 MED ORDER — INSULIN ASPART 100 UNIT/ML IJ SOLN
0.0000 [IU] | Freq: Three times a day (TID) | INTRAMUSCULAR | Status: DC
  Administered 2024-03-26: 2 [IU] via SUBCUTANEOUS
  Administered 2024-03-26 (×3): 1 [IU] via SUBCUTANEOUS

## 2024-03-25 MED ORDER — MAGNESIUM SULFATE 4 GM/100ML IV SOLN
4.0000 g | Freq: Once | INTRAVENOUS | Status: AC
  Administered 2024-03-25: 4 g via INTRAVENOUS
  Filled 2024-03-25: qty 100

## 2024-03-25 MED ORDER — POTASSIUM CHLORIDE CRYS ER 20 MEQ PO TBCR
20.0000 meq | EXTENDED_RELEASE_TABLET | ORAL | Status: AC
  Administered 2024-03-25 (×2): 20 meq via ORAL
  Filled 2024-03-25 (×2): qty 1

## 2024-03-25 MED ORDER — POTASSIUM CHLORIDE CRYS ER 10 MEQ PO TBCR
10.0000 meq | EXTENDED_RELEASE_TABLET | Freq: Once | ORAL | Status: AC
  Administered 2024-03-25: 10 meq via ORAL
  Filled 2024-03-25: qty 1

## 2024-03-25 MED ORDER — POTASSIUM CHLORIDE 10 MEQ/100ML IV SOLN
10.0000 meq | INTRAVENOUS | Status: AC
  Administered 2024-03-25 (×3): 10 meq via INTRAVENOUS
  Filled 2024-03-25 (×3): qty 100

## 2024-03-25 MED ORDER — LEVETIRACETAM 500 MG PO TABS
500.0000 mg | ORAL_TABLET | Freq: Two times a day (BID) | ORAL | Status: DC
  Administered 2024-03-25 – 2024-03-27 (×5): 500 mg via ORAL
  Filled 2024-03-25 (×5): qty 1

## 2024-03-25 NOTE — Consult Note (Signed)
 WOC Nurse Consult Note: patient with history of remote B BKA (Left 2021 by Dr. Harden, R ?)  Reason for Consult:stump wounds  Wound type:  full thickness stump ? Related to trauma vs prosthesis  Pressure Injury POA: na  Measurement: see nursing flowsheet  Wound bed: L stump superior aspect dark hemorrhagic vs eschar, distal to this area 75% dark eschar 25% pink dry; R stump 75% pink dry 25% brown dry  Drainage (amount, consistency, odor) appears dry  Periwound: Dressing procedure/placement/frequency: Cleanse stump wounds with NS, apply Xeroform gauze ([place] [phone]) daily and secure with silicone foam.    POC discussed with bedside nurse. WOC team will not follow. Patient should follow with Dr. Harden as needed as outpatient.   Thank you,    Powell Bar MSN, RN-BC, [employer]

## 2024-03-25 NOTE — Progress Notes (Addendum)
 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 Was altered, febrile and presented as a code sepsis.  In ED started on abx, fluids. CT H obtained which showed progression of prior bilateral SDH from 12/2023 as well as a new 14mm L frontal lobe SDH  There is some question of if he takes eliquis  which had prebiously been prescribed. (In my interview he denies and hasn't taken in forever)  NSGY consulted and rec holding eliquis  and repeat CT H in 8 hr  PCCM to admit in this setting   Pertinent  Medical History  Etoh  Fq falls Bilat BKA  SDH/hygromas  Significant Hospital Events: Incl/uding procedures, antibiotic start and stop dates in addition to other pertinent events   8/24 admit  Interim History / Subjective:  No acute events overnight No complaints Wants to get out of the hospital  Objective    Blood pressure (!) 102/52, pulse 85, temperature 98.5 F (36.9 C), temperature source Oral, resp. rate (!) 31, SpO2 98%.        Intake/Output Summary (Last 24 hours) at 03/25/2024 0643 Last data filed at 03/25/2024 0400 Gross per 24 hour  Intake 400.2 ml  Output --  Net 400.2 ml   There were no vitals filed for this visit.  Examination:  General: Adult male in NAD Neuro: Awake, alert, oriented. 5/5 strength in all extremities  HENT: [place]/AT, PERRL Lungs: unlabored. Room air. Clear Cardiovascular: IRIR, no MRG, rate controlled Abdomen: soft  Extremities: Stumps with scabbing wounds + open sore on R. Erythema/warmth R stump > L. No crepitus    CT 8/25: Bilateral subdurals stable from prior imaging. Bilateral displaced nasal bone  fractures. Nondisplaced fracture anterior wall left maxiallary sinus.   Resolved problem list   Assessment and Plan   Acute encephalopathy: improved. Considering how quickly he has improved there was likely a large metabolic component. SDH. -supportive care  Acute traumatic SDH on subacute SDH  -progression in size of prior bilateral SDH (01/10/2024) now 14mm R 11mm L + new 14mm acute hemorrhage posterior L frontal lobe  -there is question of this being c/b eliquis  however pt reports to me he hasn't taken that in forever P -Neurosurgery has seen. Recommending outpatient follow up in a few weeks.  -SBP < 160 mmHg: meeting goal -neuro protective measures   Facial / nasal fractures  -new from CT in June but on evaluation dont seem acute -Can consider ENT engagement.   Sepsis  ?cellulitis / wound infection vs alt etiology bilateral stump wounds POA BLE exam not c/f NSSTI  BCID 1/4 MRSA PCT 3.6 P -Bcx UA  -vanc zosyn   -WOC following  Hypokalemia -replacing   Thrombocytopenia -follow cbc   Bilateral stump wounds (POA) - Appreciate WOC consult   EtOH use disorder -he can't tell me when last drink was  P -monitor for s/sx withdrawal deferring BZD at the moment -- would like to be notified if behavior change so we can help tease out if c/f Dts or if needs to go for CT H sooner than anticipated -micronutrient support   DM2 -metformin  at home, has allergies listed to  some insulin  -need to clarify w pharmacy, acutely would like to do SSI. Looks like has tolerated in the past.  Frequent Falls -? polypharmacy playing a role  P -PT/OT  -holding home meds for now   Transfer out of ICU, to [facility] for ongoing ID workup.   Best Practice (right click and Reselect all SmartList Selections daily)   Diet/type: Start heart healthy diet DVT prophylaxis not indicated Pressure ulcer(s): present on admission  GI prophylaxis: N/A Lines: N/A Foley:  N/A Code Status:  full  code Last date of multidisciplinary goals of care discussion [--]    Critical care time:        Deward Eastern, AGACNP-BC [facility]  See Amion for personal pager PCCM on call pager [phone] until 7pm. Please call Elink 7p-7a. [phone]  03/25/2024 6:49 AM

## 2024-03-25 NOTE — Progress Notes (Signed)
 PHARMACY - PHYSICIAN COMMUNICATION CRITICAL VALUE ALERT - BLOOD CULTURE IDENTIFICATION (BCID)  Cory Palmer is an 63 y.o. male who presented to [facility] on 03/24/2024 with a chief complaint of falls. Has multiple wounds.   Assessment:  1/4 Blood cultures + for MRSA   Name of physician (or Provider) Contacted: Deward Eastern, NP   Current antibiotics: Vancomycin  / zosyn    Changes to prescribed antibiotics recommended:  Patient is on recommended antibiotics - No changes needed.    Results for orders placed or performed during the hospital encounter of 03/24/24  Blood Culture ID Panel (Reflexed) (Collected: 03/24/2024  1:42 PM)  Result Value Ref Range   Enterococcus faecalis NOT DETECTED NOT DETECTED   Enterococcus Faecium NOT DETECTED NOT DETECTED   Listeria monocytogenes NOT DETECTED NOT DETECTED   Staphylococcus species DETECTED (A) NOT DETECTED   Staphylococcus aureus (BCID) DETECTED (A) NOT DETECTED   Staphylococcus epidermidis NOT DETECTED NOT DETECTED   Staphylococcus lugdunensis NOT DETECTED NOT DETECTED   Streptococcus species NOT DETECTED NOT DETECTED   Streptococcus agalactiae NOT DETECTED NOT DETECTED   Streptococcus pneumoniae NOT DETECTED NOT DETECTED   Streptococcus pyogenes NOT DETECTED NOT DETECTED   A.calcoaceticus-baumannii NOT DETECTED NOT DETECTED   Bacteroides fragilis NOT DETECTED NOT DETECTED   Enterobacterales NOT DETECTED NOT DETECTED   Enterobacter cloacae complex NOT DETECTED NOT DETECTED   Escherichia coli NOT DETECTED NOT DETECTED   Klebsiella aerogenes NOT DETECTED NOT DETECTED   Klebsiella oxytoca NOT DETECTED NOT DETECTED   Klebsiella pneumoniae NOT DETECTED NOT DETECTED   Proteus species NOT DETECTED NOT DETECTED   Salmonella species NOT DETECTED NOT DETECTED   Serratia marcescens NOT DETECTED NOT DETECTED   Haemophilus influenzae NOT DETECTED NOT DETECTED   Neisseria meningitidis NOT DETECTED NOT DETECTED   Pseudomonas aeruginosa NOT DETECTED  NOT DETECTED   Stenotrophomonas maltophilia NOT DETECTED NOT DETECTED   Candida albicans NOT DETECTED NOT DETECTED   Candida auris NOT DETECTED NOT DETECTED   Candida glabrata NOT DETECTED NOT DETECTED   Candida krusei NOT DETECTED NOT DETECTED   Candida parapsilosis NOT DETECTED NOT DETECTED   Candida tropicalis NOT DETECTED NOT DETECTED   Cryptococcus neoformans/gattii NOT DETECTED NOT DETECTED   Meth resistant mecA/C and MREJ DETECTED (A) NOT DETECTED    [name] 03/25/2024  9:15 AM

## 2024-03-25 NOTE — Consult Note (Signed)
 Providing Compassionate, Quality Care - Together  Neurosurgery Consult  Referring physician: EDP Reason for referral: SDH  History of Present Illness: This is a 63 yo M w/ PMH of bilat BKAs, Afib prescribed Eliquis , frequent falls, etoh abuse, homelessness, bilat subdural hygromas (12/2023) presented to ED after a fall from wheelchair which he reportedly does frequently. Workup significant for b/l SDH for which NSGY consulted. Pt denies recently taking Eliquis .   Objective:  BP 111/64   Pulse 89   Temp 98.5 F (36.9 C) (Oral)   Resp (!) 40   SpO2 91%   CT Head Wo Contrast Result Date: 03/25/2024 CLINICAL DATA:  Mental status changes with bilateral subdural hematomas, follow-up from yesterday. EXAM: CT HEAD WITHOUT CONTRAST TECHNIQUE: Contiguous axial images were obtained from the base of the skull through the vertex without intravenous contrast. RADIATION DOSE REDUCTION: This exam was performed according to the departmental dose-optimization program which includes automated exposure control, adjustment of the mA and/or kV according to patient size and/or use of iterative reconstruction technique. COMPARISON:  CT head yesterday at 3:00 p.m., CT head 01/10/2019 FINDINGS: Brain: Bilateral subdural hematomas extend over the cerebral convexities, on the left with maximal thickness 1.1 cm in the posterolateral mid parietal area, on the right with maximum thickness at 1.3 cm lateral mid/upper frontal convexity. There is mild gyral crowding but no overt downward mass effect. Basal cisterns are clear. Secondary mass effect is balanced, with no midline shifting seen. A 1.4 cm focal high posterior left frontal subdural bleed is also again noted, best seen on 5:47. Both subdural collections contain hyperdense portions consistent with recent hemorrhaging. No new intracranial hemorrhage is seen. There is background mild atrophy and small-vessel disease. No cortical based acute infarct is evident or focal mass.  No posterior fossa collections. Vascular: Scattered calcification both siphons. No hyperdense central vasculature. Skull: Negative for fractures or focal lesions. Sinuses/Orbits: Moderate patchy opacification of the ethmoid air cells. Mild membrane thickening of the sphenoid and maxillary sinuses. The frontal sinuses, bilateral mastoid air cells, and middle ears are clear. Negative orbits. Other: Bilateral displaced nasal bone fractures. Nondisplaced fracture anterior wall left maxillary sinus and the left lower orbital rim. The age indeterminate but new from 01/10/2024. IMPRESSION: 1. Bilateral subdural hematomas extending over the cerebral convexities, with maximal thickness 1.1 cm on the left and 1.3 cm on the right. Secondary mass effect is balanced, with no midline shifting or overt downward mass effect. Unchanged. 2. 1.4 cm focal high posterior left frontal subdural bleed is also again noted. 3. No new intracranial hemorrhage is seen. 4. Bilateral displaced nasal bone fractures. Nondisplaced fracture anterior wall left maxillary sinus and left lower orbital rim. The age indeterminate but new from 01/10/2024. 5. Sinus disease.  No sinus fluid levels. Electronically Signed   By: Francis Quam M.D.   On: 03/25/2024 01:42   DG Abd 1 View Result Date: 03/24/2024 CLINICAL DATA:  Nausea and vomiting EXAM: ABDOMEN - 1 VIEW COMPARISON:  01/05/2024 FINDINGS: Scattered large and small bowel gas is noted. Mild retained fecal material is seen without obstructive change. No free air is noted. No abnormal mass or abnormal calcifications are seen. Mild degenerative change of the lumbar spine is noted. IMPRESSION: No acute abnormality noted. Electronically Signed   By: Oneil Devonshire M.D.   On: 03/24/2024 22:35   CT Head Wo Contrast Result Date: 03/24/2024 CLINICAL DATA:  Provided history: Mental status change, unknown cause. EXAM: CT HEAD WITHOUT CONTRAST TECHNIQUE: Contiguous axial images  were obtained from the base of  the skull through the vertex without intravenous contrast. RADIATION DOSE REDUCTION: This exam was performed according to the departmental dose-optimization program which includes automated exposure control, adjustment of the mA and/or kV according to patient size and/or use of iterative reconstruction technique. COMPARISON:  Head CT 01/10/2024. FINDINGS: Brain: Mild generalized cerebral atrophy. Subdural hematomas along the bilateral cerebral convexities have increased in size since the head CT of 01/10/2024. The subdural hematoma on the right now measures up to 14 mm in thickness (previously 4 mm). The subdural hematoma on the left now measures up to 11 mm in thickness (previously 5 mm). The hematomas are mixed density and there are new hyperdense components (consistent with acute hemorrhage) within both collections. Balanced mass effect upon the underlying cerebral hemispheres. No midline shift or significant ventricular effacement. Additional 14 mm ovoid focus of acute hemorrhage overlying the high posterior left frontal lobe, which is also favored subdural in location (for instance as seen on series 4, image 43). No demarcated cortical infarct. No evidence of an intracranial mass. Vascular: No hyperdense vessel.  Atherosclerotic calcifications. Skull: No calvarial fracture or aggressive osseous lesion. Sinuses/Orbits: No mass or acute finding within the imaged orbits. Displaced fractures of the bilateral nasal bones, anterior wall of the left maxillary sinus and left inferior orbital rim, new from the prior head CT of 01/10/2024. These results were called by telephone at the time of interpretation on 03/24/2024 at 3:26 pm to provider [facility] , who verbally acknowledged these results. IMPRESSION: 1. Interval increase in size of subdural hematomas overlying the bilateral cerebral hemispheres (now measuring up to 14 mm in thickness on the right and 11 mm in thickness on the left). The subdural hematomas are  mixed density and there are new hyperdense components (consistent with acute hemorrhage) within both collections. Balanced mass effect upon the underlying cerebral hemispheres. No midline shift. 2. Additional 14 mm ovoid focus of acute hemorrhage overlying the high posterior left frontal lobe, which is also favored subdural in location 3. Displaced fractures of the bilateral nasal bones, anterior wall of the left maxillary sinus and left inferior orbital rim, new from the prior head CT of 01/10/2024. 4. Mild generalized cerebral atrophy. Electronically Signed   By: Rockey Childs D.O.   On: 03/24/2024 15:28   DG Chest Port 1 View Result Date: 03/24/2024 CLINICAL DATA:  Questionable sepsis - evaluate for abnormality EXAM: PORTABLE CHEST 1 VIEW COMPARISON:  01/05/2024. FINDINGS: Stable cardiomediastinal contours. Similar patchy scarring in the right upper lobe. No focal consolidation, pleural effusion, or pneumothorax. No acute osseous abnormality. IMPRESSION: No acute cardiopulmonary findings. Electronically Signed   By: Harrietta Sherry M.D.   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   Plan:  -Continue to hold Eliquis  7-10d -Continue prophylactic Keppra    Janica Eldred CAYLIN Aysen Shieh, PA-C

## 2024-03-25 NOTE — Progress Notes (Signed)
 Pharmacy Electrolyte Replacement  Recent Labs:  Recent Labs    03/25/24 0720  K 3.3*  MG 1.3*  CREATININE 0.85    Low Critical Values (K </= 2.5, Phos </= 1, Mg </= 1) Present: None  MD Contacted: n/s   Plan: 6 g mag sulfate IV x1, 40 mEq PO and 40 mEq IV KCL per protocol   [name], PharmD, BCPS, BCCCP Clinical Pharmacist

## 2024-03-25 NOTE — TOC CAGE-AID Note (Signed)
 Transition of Care [facility]) - CAGE-AID Screening  Patient Details  Name: Cory Palmer MRN: [id number] Date of Birth: 07-24-1961  Clinical Narrative:  Patient endorses occasional alcohol use, usually 1x per month with friends. Patient smokes cigarettes - 5-6 per day. Patient offered substance abuse resources but denies need at this time.  CAGE-AID Screening:   Have You Ever Felt You Ought to Cut Down on Your Drinking or Drug Use?: No Have People Annoyed You By Critizing Your Drinking Or Drug Use?: No Have You Felt Bad Or Guilty About Your Drinking Or Drug Use?: No Have You Ever Had a Drink or Used Drugs First Thing In The Morning to Steady Your Nerves or to Get Rid of a Hangover?: No CAGE-AID Score: 0  Substance Abuse Education Offered: Yes

## 2024-03-25 NOTE — Progress Notes (Signed)
 eLink Physician-Brief Progress Note Patient Name: Cory Palmer DOB: 1960/12/17 MRN: [id number]   Date of Service  03/25/2024  HPI/Events of Note  Patient with encephalopathy thought to be secondary to acute traumatic subdural hematoma  Diabetic, transferred out of ICU today  eICU Interventions  Transition to insulin  AC/at bedtime   0457 - has not had any urine outut charted since 1345 yesterday. Nurse bladder scanned and it's showing 323cc. Pt states that he feels like he can void but can't.  Initiate standard retention protocol.  Intervention Category Minor Interventions: Routine modifications to care plan (e.g. PRN medications for pain, fever)  Cory Palmer 03/25/2024, 10:16 PM

## 2024-03-25 NOTE — Consult Note (Signed)
 [facility]    Date of Admission:  03/24/2024     Total days of antibiotics 2               Reason for Consult: MRSA bacteremia    Referring Provider: Champ/autoconsult Primary Care Provider: Delores Rojelio Caldron, NP   ASSESSMENT:  Cory Palmer is a 63 y/o caucasian male presenting to the hospital following a fall from his wheelchair and found to have fever and MRSA bacteremia. T  Source of infection currently unclear. Discussed plan of care to continue current dose of vancomycin  and discontinue piperacillin /tazobactam. Obtain blood cultures for clearance of bacteremia and TTE to check for endocarditis. May need TEE. Therapeutic drug monitoring of renal function and vancomycin  levels. Hold central line placement pending clearance of bacteremia as able. Initiate Contact Precautions for MRSA. Subdural hematoma management per Neurosurgery recommendations. Wound care to stumps per Wound RN recommendations. Remaining medical and supportive care per PCCM.   PLAN:  Continue current dose of vancomycin .  Therapeutic drug monitor of renal function and vancomycin  levels per protocol.  Obtain blood cultures for clearance of bacteremia.  Check TTE for endocarditis and may need TEE.  Hold central line placement pending clearance of blood cultures.  Initiate Contact Precautions for MRSA.  Wound care per Wound RN recommendations. SDH management per Neurosurgery.  Remaining medical and supportive care per PCCM.    Principal Problem:   Traumatic subdural hematoma (SDH) (HCC)    Chlorhexidine  Gluconate Cloth  6 each Topical Daily   folic acid   1 mg Oral Daily   insulin  aspart  0-9 Units Subcutaneous Q4H   levETIRAcetam   500 mg Oral BID   multivitamin with minerals  1 tablet Oral Daily   thiamine   100 mg Oral Daily   Or   thiamine   100 mg Intravenous Daily     HPI: Cory Palmer is a 63 y.o. male with previous medical history of Type 2 diabetes, hypertension,  liver cirrhosis, and paroxysmal atrial fibrillation presenting to the hospital after falling out of his wheelchair with right shoulder pain and right side abdominal laceration.   Cory Palmer is previously known to the ID service having last been seen in March 2021 for MRSA bacteremia s/p left below knee amputation in the setting of a foot ulcer and osteomyelitis. Now presenting to the ED following a fall from a wheelchair and concern for fever.  Temperature of 103 F with leukocytosis of 17,500. Chest x-ray with no acute cardiopulmonary findings. CT head with interval increase in size of subdural hematoma and 14 mm acute hemorrhage overlying the high posterior left frontal lobe. Neurosurgery recommended no indications for surgical interventions and monitor radiographically. Started on broad spectrum antibiotics. Blood culture now positive for MRSA. Wound RN provided recommendations for bilateral stumps.    Review of Systems: Review of Systems  Constitutional:  Negative for chills, fever and weight loss.  Respiratory:  Negative for cough, shortness of breath and wheezing.   Cardiovascular:  Negative for chest pain and leg swelling.  Gastrointestinal:  Negative for abdominal pain, constipation, diarrhea, nausea and vomiting.  Skin:  Negative for rash.     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    Social History   Tobacco Use   Smoking status: Every Day    Current packs/day: 0.25    Types: Cigarettes    Passive exposure: Current   Smokeless tobacco: Never  Vaping Use   Vaping status: Never Used  Substance Use Topics   Alcohol use: Yes   Drug use: Not  Currently    Types: Cocaine    Comment: Last used in July 2023    Family History  Problem Relation Age of Onset   Hypertension Other    Diabetes Mellitus II Other     Allergies  Allergen Reactions   Atorvastatin Calcium  Nausea And Vomiting   Gemfibrozil     Unknown   Insulin  Glargine     Unknown    OBJECTIVE: Blood pressure 117/60, pulse (!) 102, temperature (!) 100.5 F (38.1 C), temperature source Oral, resp. rate 20, weight 109.3 kg, SpO2 98%.  Physical Exam Constitutional:      General: He is not in acute distress.    Appearance: He is well-developed.  Cardiovascular:     Rate and Rhythm: Normal rate and regular rhythm.     Heart sounds: Normal heart sounds.  Pulmonary:     Effort: Pulmonary effort is normal.     Breath sounds: Normal breath sounds.  Musculoskeletal:     Comments: S/p bilateral BKA  Skin:    General: Skin is warm and dry.  Neurological:     Mental Status: He is alert and oriented to person, place, and time.     Lab Results Lab Results  Component Value Date   WBC 9.5 03/25/2024   HGB 11.2 (L) 03/25/2024   HCT 31.9 (L) 03/25/2024   MCV 96.7 03/25/2024   PLT 61 (L) 03/25/2024    Lab Results  Component Value Date   CREATININE 0.85 03/25/2024   BUN 15 03/25/2024   NA 131 (L) 03/25/2024   K 3.3 (L) 03/25/2024   CL 103 03/25/2024   CO2 21 (L) 03/25/2024    Lab Results  Component Value Date   ALT 14 03/25/2024   AST 26 03/25/2024   ALKPHOS 70 03/25/2024   BILITOT 2.1 (H) 03/25/2024     Microbiology: Recent Results (from the past 240 hours)  Blood Culture (routine x 2)     Status: None (Preliminary result)   Collection Time: 03/24/24  1:42 PM   Specimen: BLOOD  Result Value Ref Range Status   Specimen Description BLOOD LEFT ANTECUBITAL  Final   Special Requests   Final    BOTTLES DRAWN AEROBIC AND ANAEROBIC Blood Culture adequate volume   Culture  Setup Time   Final    GRAM POSITIVE COCCI IN CLUSTERS AEROBIC BOTTLE  ONLY CRITICAL RESULT CALLED TO, READ BACK BY AND VERIFIED WITH: MAYA RENELLA NOVAK [id number] FCP Performed at [facility], [address] [place], [place] [address]    Culture GRAM POSITIVE COCCI  Final   Report Status PENDING  Incomplete  Blood Culture ID Panel (Reflexed)     Status: Abnormal   Collection Time: 03/24/24  1:42 PM  Result Value Ref Range Status   Enterococcus faecalis NOT DETECTED NOT DETECTED Final   Enterococcus Faecium NOT DETECTED NOT DETECTED Final   Listeria monocytogenes NOT DETECTED NOT DETECTED Final   Staphylococcus species DETECTED (A) NOT DETECTED Final    Comment: CRITICAL RESULT CALLED TO, READ BACK  BY AND VERIFIED WITH: PHARMD CARLEN B [id number] FCP    Staphylococcus aureus (BCID) DETECTED (A) NOT DETECTED Final    Comment: Methicillin (oxacillin)-resistant Staphylococcus aureus (MRSA). MRSA is predictably resistant to beta-lactam antibiotics (except ceftaroline). Preferred therapy is vancomycin  unless clinically contraindicated. Patient requires contact precautions if  hospitalized. CRITICAL RESULT CALLED TO, READ BACK BY AND VERIFIED WITH: PHARMD CARLEN B [id number] FCP    Staphylococcus epidermidis NOT DETECTED NOT DETECTED Final   Staphylococcus lugdunensis NOT DETECTED NOT DETECTED Final   Streptococcus species NOT DETECTED NOT DETECTED Final   Streptococcus agalactiae NOT DETECTED NOT DETECTED Final   Streptococcus pneumoniae NOT DETECTED NOT DETECTED Final   Streptococcus pyogenes NOT DETECTED NOT DETECTED Final   A.calcoaceticus-baumannii NOT DETECTED NOT DETECTED Final   Bacteroides fragilis NOT DETECTED NOT DETECTED Final   Enterobacterales NOT DETECTED NOT DETECTED Final   Enterobacter cloacae complex NOT DETECTED NOT DETECTED Final   Escherichia coli NOT DETECTED NOT DETECTED Final   Klebsiella aerogenes NOT DETECTED NOT DETECTED Final   Klebsiella oxytoca NOT DETECTED NOT DETECTED Final   Klebsiella pneumoniae NOT DETECTED NOT  DETECTED Final   Proteus species NOT DETECTED NOT DETECTED Final   Salmonella species NOT DETECTED NOT DETECTED Final   Serratia marcescens NOT DETECTED NOT DETECTED Final   Haemophilus influenzae NOT DETECTED NOT DETECTED Final   Neisseria meningitidis NOT DETECTED NOT DETECTED Final   Pseudomonas aeruginosa NOT DETECTED NOT DETECTED Final   Stenotrophomonas maltophilia NOT DETECTED NOT DETECTED Final   Candida albicans NOT DETECTED NOT DETECTED Final   Candida auris NOT DETECTED NOT DETECTED Final   Candida glabrata NOT DETECTED NOT DETECTED Final   Candida krusei NOT DETECTED NOT DETECTED Final   Candida parapsilosis NOT DETECTED NOT DETECTED Final   Candida tropicalis NOT DETECTED NOT DETECTED Final   Cryptococcus neoformans/gattii NOT DETECTED NOT DETECTED Final   Meth resistant mecA/C and MREJ DETECTED (A) NOT DETECTED Final    Comment: CRITICAL RESULT CALLED TO, READ BACK BY AND VERIFIED WITH: MAYA HARP B [id number] FCP Performed at [facility], [address], [place], [place] [address]   Resp panel by RT-PCR (RSV, Flu A&B, Covid) Anterior Nasal Swab     Status: None   Collection Time: 03/24/24  1:43 PM   Specimen: Anterior Nasal Swab  Result Value Ref Range Status   SARS Coronavirus 2 by RT PCR NEGATIVE NEGATIVE Final   Influenza A by PCR NEGATIVE NEGATIVE Final   Influenza B by PCR NEGATIVE NEGATIVE Final    Comment: (NOTE) The Xpert Xpress SARS-CoV-2/FLU/RSV plus assay is intended as an aid in the diagnosis of influenza from Nasopharyngeal swab specimens and should not be used as a sole basis for treatment. Nasal washings and aspirates are unacceptable for Xpert Xpress SARS-CoV-2/FLU/RSV testing.  Fact Sheet for Patients: BloggerCourse.com  Fact Sheet for Healthcare Providers: SeriousBroker.it  This test is not yet approved or cleared by the [place]  FDA and has been authorized for detection and/or  diagnosis of SARS-CoV-2 by FDA under an Emergency Use Authorization (EUA). This EUA will remain in effect (meaning this test can be used) for the duration of the COVID-19 declaration under Section 564(b)(1) of the Act, 21 U.S.C. section 360bbb-3(b)(1), unless the authorization is terminated or revoked.     Resp Syncytial Virus by PCR NEGATIVE NEGATIVE Final    Comment: (NOTE) Fact Sheet for Patients: BloggerCourse.com  Fact Sheet for Healthcare Providers: SeriousBroker.it  This test is not yet approved  or cleared by the [place]  FDA and has been authorized for detection and/or diagnosis of SARS-CoV-2 by FDA under an Emergency Use Authorization (EUA). This EUA will remain in effect (meaning this test can be used) for the duration of the COVID-19 declaration under Section 564(b)(1) of the Act, 21 U.S.C. section 360bbb-3(b)(1), unless the authorization is terminated or revoked.  Performed at [facility], [address], [place], [place] [address]   MRSA Next Gen by PCR, Nasal     Status: None   Collection Time: 03/24/24  5:06 PM   Specimen: Nasal Mucosa; Nasal Swab  Result Value Ref Range Status   MRSA by PCR Next Gen NOT DETECTED NOT DETECTED Final    Comment: (NOTE) The GeneXpert MRSA Assay (FDA approved for NASAL specimens only), is one component of a comprehensive MRSA colonization surveillance program. It is not intended to diagnose MRSA infection nor to guide or monitor treatment for MRSA infections. Test performance is not FDA approved in patients less than 55 years old. Performed at [facility], [address], [place], [place] [address]   Blood Culture (routine x 2)     Status: None (Preliminary result)   Collection Time: 03/24/24  6:55 PM   Specimen: BLOOD  Result Value Ref Range Status   Specimen Description BLOOD BLOOD RIGHT HAND  Final   Special Requests   Final    BOTTLES DRAWN AEROBIC AND  ANAEROBIC Blood Culture adequate volume   Culture   Final    NO GROWTH < 24 HOURS Performed at [facility], [address] [place], [place] [address]    Report Status PENDING  Incomplete     Cathlyn July, NP [facility]  03/25/2024  4:39 PM

## 2024-03-25 NOTE — Plan of Care (Signed)
  Problem: Health Behavior/Discharge Planning: Goal: Ability to identify and utilize available resources and services will improve Outcome: Not Progressing Goal: Ability to manage health-related needs will improve Outcome: Not Progressing   Problem: Nutritional: Goal: Maintenance of adequate nutrition will improve Outcome: Not Progressing Goal: Progress toward achieving an optimal weight will improve Outcome: Not Progressing   Problem: Tissue Perfusion: Goal: Adequacy of tissue perfusion will improve Outcome: Not Progressing   Problem: Clinical Measurements: Goal: Cardiovascular complication will be avoided Outcome: Not Progressing   Problem: Activity: Goal: Risk for activity intolerance will decrease Outcome: Not Progressing   Problem: Pain Managment: Goal: General experience of comfort will improve and/or be controlled Outcome: Not Progressing   Problem: Safety: Goal: Ability to remain free from injury will improve Outcome: Not Progressing

## 2024-03-26 ENCOUNTER — Inpatient Hospital Stay (HOSPITAL_COMMUNITY)

## 2024-03-26 DIAGNOSIS — A419 Sepsis, unspecified organism: Secondary | ICD-10-CM | POA: Diagnosis not present

## 2024-03-26 DIAGNOSIS — R338 Other retention of urine: Secondary | ICD-10-CM

## 2024-03-26 DIAGNOSIS — R7881 Bacteremia: Secondary | ICD-10-CM

## 2024-03-26 DIAGNOSIS — R6520 Severe sepsis without septic shock: Secondary | ICD-10-CM | POA: Diagnosis not present

## 2024-03-26 DIAGNOSIS — S065X0A Traumatic subdural hemorrhage without loss of consciousness, initial encounter: Secondary | ICD-10-CM | POA: Diagnosis not present

## 2024-03-26 LAB — CBC
HCT: 32.1 % — ABNORMAL LOW (ref 39.0–52.0)
Hemoglobin: 10.8 g/dL — ABNORMAL LOW (ref 13.0–17.0)
MCH: 32.9 pg (ref 26.0–34.0)
MCHC: 33.6 g/dL (ref 30.0–36.0)
MCV: 97.9 fL (ref 80.0–100.0)
Platelets: 64 K/uL — ABNORMAL LOW (ref 150–400)
RBC: 3.28 MIL/uL — ABNORMAL LOW (ref 4.22–5.81)
RDW: 13.8 % (ref 11.5–15.5)
WBC: 5.6 K/uL (ref 4.0–10.5)
nRBC: 0 % (ref 0.0–0.2)

## 2024-03-26 LAB — COMPREHENSIVE METABOLIC PANEL WITH GFR
ALT: 14 U/L (ref 0–44)
AST: 31 U/L (ref 15–41)
Albumin: 2.4 g/dL — ABNORMAL LOW (ref 3.5–5.0)
Alkaline Phosphatase: 74 U/L (ref 38–126)
Anion gap: 11 (ref 5–15)
BUN: 15 mg/dL (ref 8–23)
CO2: 19 mmol/L — ABNORMAL LOW (ref 22–32)
Calcium: 8.1 mg/dL — ABNORMAL LOW (ref 8.9–10.3)
Chloride: 101 mmol/L (ref 98–111)
Creatinine, Ser: 0.67 mg/dL (ref 0.61–1.24)
GFR, Estimated: >60 mL/min (ref >60)
Glucose, Bld: 191 mg/dL — ABNORMAL HIGH (ref 70–99)
Potassium: 3.8 mmol/L (ref 3.5–5.1)
Sodium: 131 mmol/L — ABNORMAL LOW (ref 135–145)
Total Bilirubin: 1.5 mg/dL — ABNORMAL HIGH (ref 0.0–1.2)
Total Protein: 5.4 g/dL — ABNORMAL LOW (ref 6.5–8.1)

## 2024-03-26 LAB — ECHOCARDIOGRAM COMPLETE
AV Mean grad: 5 mmHg
AV Peak grad: 7.4 mmHg
Ao pk vel: 1.36 m/s
Area-P 1/2: 4.77 cm2
Est EF: 50
S' Lateral: 4.2 cm

## 2024-03-26 LAB — GLUCOSE, CAPILLARY
Glucose-Capillary: 139 mg/dL — ABNORMAL HIGH (ref 70–99)
Glucose-Capillary: 139 mg/dL — ABNORMAL HIGH (ref 70–99)
Glucose-Capillary: 168 mg/dL — ABNORMAL HIGH (ref 70–99)
Glucose-Capillary: 149 mg/dL — ABNORMAL HIGH (ref 70–99)

## 2024-03-26 LAB — CK: Total CK: 55 U/L (ref 49–397)

## 2024-03-26 LAB — MAGNESIUM: Magnesium: 1.7 mg/dL (ref 1.7–2.4)

## 2024-03-26 MED ORDER — DIAZEPAM 5 MG PO TABS: 5.0000 mg | ORAL_TABLET | Freq: Four times a day (QID) | ORAL | Status: DC | PRN

## 2024-03-26 MED ORDER — NICOTINE 14 MG/24HR TD PT24
14.0000 mg | MEDICATED_PATCH | Freq: Every day | TRANSDERMAL | Status: DC
  Administered 2024-03-26 – 2024-03-27 (×2): 14 mg via TRANSDERMAL
  Filled 2024-03-26 (×2): qty 1

## 2024-03-26 MED ORDER — QUETIAPINE FUMARATE 200 MG PO TABS
300.0000 mg | ORAL_TABLET | Freq: Every day | ORAL | Status: DC
  Administered 2024-03-26 – 2024-03-27 (×2): 300 mg via ORAL
  Filled 2024-03-26 (×2): qty 1

## 2024-03-26 MED ORDER — TAMSULOSIN HCL 0.4 MG PO CAPS
0.4000 mg | ORAL_CAPSULE | Freq: Every day | ORAL | Status: DC
  Administered 2024-03-26 – 2024-03-27 (×2): 0.4 mg via ORAL
  Filled 2024-03-26 (×2): qty 1

## 2024-03-26 MED ORDER — TRAZODONE HCL 50 MG PO TABS
50.0000 mg | ORAL_TABLET | Freq: Every day | ORAL | Status: DC
  Administered 2024-03-26 – 2024-03-27 (×2): 50 mg via ORAL
  Filled 2024-03-26 (×2): qty 1

## 2024-03-26 NOTE — Progress Notes (Addendum)
 PROGRESS NOTE  Cory Palmer FMW:[id number] DOB: [birth year]   PCP: Delores Rojelio Caldron, NP  Patient is from: Homeless.  Uses wheelchair for mobility.  DOA: 03/24/2024 LOS: 2  Chief complaints Chief Complaint  Patient presents with   Fall   Altered Mental Status     Brief Narrative / Interim history: 63 year old M with PMH of DM-2, PE/PAF no longer taking AC, traumatic bilateral SDH in 12/2023, PVD/bilateral BKA, prior MRSA bacteremia, liver cirrhosis, BPH, polysubstance use (cocaine, EtOH and tobacco), substance-induced mood disorder and anxiety brought to ED with altered mental status and fever after he fell out of wheelchair, and admitted to ICU for sepsis and bilateral SDH..   In ED, febrile to 103, tachycardic to 120s and tachypneic.  WBC 17.5 with left shift.  Lactic acid 2.3.  Platelets 78.  CTH showed progression of prior bilateral SDH from 12/2023 as well as a new 14mm L frontal lobe SDH displaced bilateral nasal bone, anterior wall left maxillary sinus and left lateral orbital rim fractures.  Eliquis  on his medication list but patient denied using.  Cultures obtained.  Started on broad-spectrum antibiotics. NSGY consulted and rec holding eliquis  and repeat CTH in 8 hr.  Repeat CTH on 8/25 with stable bilateral SDH.  Blood culture with MRSA in 1 out of 4 bottles.  ID consulted.  Patient was transferred to hospitalist service on 8/26.   Subjective: Seen and examined earlier this morning.  Patient had urinary retention with removal of 925 cc urine on I&O cath last night.  No complaint this morning.  Objective: Vitals:   03/26/24 0107 03/26/24 0325 03/26/24 0336 03/26/24 0744  BP: 112/63 119/74  108/70  Pulse: 85 79  80  Resp: 18 18  18   Temp: 98.2 F (36.8 C) 99.1 F (37.3 C)  98.8 F (37.1 C)  TempSrc: Oral Oral    SpO2: 96% 94%  97%  Weight:   112 kg     Examination:  GENERAL: No apparent distress.  Nontoxic. HEENT: MMM.  Vision and hearing grossly intact.  NECK:  Supple.  No apparent JVD.  RESP:  No IWOB.  Fair aeration bilaterally. CVS:  RRR. Heart sounds normal.  ABD/GI/GU: BS+. Abd soft, NTND.  MSK/EXT: Bilateral BKA.  Healing wounds on bilateral BKA.  No signs of infection. SKIN: no apparent skin lesion or wound NEURO: AA.  Oriented appropriately.  No apparent focal neuro deficit. PSYCH: Calm. Normal affect.   Consultants:  Neurosurgery  Procedures: None  Microbiology summarized: 8/24-COVID-19, influenza and RSV PCR nonreactive 8/24-blood culture with MRSA in 1 out of 4 bottles 8/26-repeat blood culture pending  Assessment and plan: Severe sepsis due to MRSA bacteremia: Present on admission.  Febrile with tachycardia, tachypnea, leukocytosis and lactic acidosis and encephalopathy.  Blood culture with MRSA in 1 out of 4 bottles.  Unclear source of infection.  He has healing bilateral BKA wounds that does not look infected.  Patient denies recreational drug use or IVDU. - Continue IV vancomycin  - Follow repeat blood culture and TTE - Appreciate help by infectious disease   Acute traumatic SDH on subacute SDH: progression in size of prior bilateral SDH (01/10/2024) now 14mm R 11mm L + new 14mm acute hemorrhage posterior L frontal lobe.  There was a question if he was taking Eliquis  but patient denied.  SDH stable on repeat CTH.  NSGY recommended outpatient follow up in a few weeks.  -SBP < 160 mmHg: meeting goal -Continue Keppra  500 mg twice daily -neuro protective  measures    Facial / nasal fractures: new from CT in June but on evaluation dont seem acute - Outpatient follow-up with ENT once bacteremia resolves.  Acute metabolic encephalopathy: Likely due to sepsis.  Initial and repeat CTH as above.  UDS positive for benzo.  Resolved. - Minimize sedating medications.  Prediabetes/hyperglycemia: A1c 5.7%.  CBG within acceptable range.  Not on medication at home. Recent Labs  Lab 03/25/24 0743 03/25/24 1126 03/25/24 1545 03/25/24 1953  03/26/24 0602  GLUCAP 142* 115* 125* 170* 139*  - Continue SSI-sensitive  EtOH use disorder: Reports occasional vodka.  He says he gets about fifth of vodka and shares with friends.  Denies major alcohol withdrawal symptoms in the past.  No signs and symptoms of withdrawal currently.  Might have contributed to his fall. - Encouraged alcohol cessation -Decrease diazepam  to 5 mg every 6 hours as needed -Thiamine , folic acid  and multivitamin  History of PE/PAF: No longer takes anticoagulation.  Currently in sinus rhythm.   History of liver cirrhosis: Likely alcoholic.  Appears compensated. -Encouraged alcohol cessation. - Outpatient follow-up  Hypokalemia -Monitor replenish K and Mg as appropriate  Tobacco use disorder: Reports smoking about 8 cigarettes a day. -Encouraged smoking cessation -Nicotine  patch.  Patient is interested.   Acute on chronic thrombocytopenia: Likely due to acute infection. -Continue monitoring  Bilateral stump wounds (POA) - Appreciate WOC consult    Frequent Falls/bilateral BKA: Wheelchair dependent at baseline. -Encourage alcohol cessation -Fall precaution -PT/OT eval  BPH with acute urinary retention: Patient had 925 cc urine output on I&O cath the night of 8/25-26. - Bladder scan every 6 hours - Strict intake and output - Start Flomax  - Foley if he has another urinary retention.  Class I obesity Body mass index is 33.49 kg/m.           DVT prophylaxis:  SCD due to SDH and thrombocytopenia  Code Status: Full code Family Communication: None at bedside Level of care: Progressive Status is: Inpatient Remains inpatient appropriate because: Severe sepsis, subdural hematoma   Final disposition: To be determined   55 minutes with more than 50% spent in reviewing records, counseling patient/family and coordinating care.   Sch Meds:  Scheduled Meds:  Chlorhexidine  Gluconate Cloth  6 each Topical Daily   folic acid   1 mg Oral Daily    insulin  aspart  0-9 Units Subcutaneous TID AC & HS   levETIRAcetam   500 mg Oral BID   multivitamin with minerals  1 tablet Oral Daily   thiamine   100 mg Oral Daily   Continuous Infusions:  vancomycin  1,000 mg (03/26/24 0555)   PRN Meds:.acetaminophen , diazepam  **OR** [DISCONTINUED] diazepam , docusate sodium , ondansetron  (ZOFRAN ) IV, polyethylene glycol  Antimicrobials: Anti-infectives (From admission, onward)    Start     Dose/Rate Route Frequency Ordered Stop   03/25/24 0600  vancomycin  (VANCOCIN ) IVPB 1000 mg/200 mL premix        1,000 mg 200 mL/hr over 60 Minutes Intravenous Every 12 hours 03/24/24 1719     03/24/24 2200  piperacillin -tazobactam (ZOSYN ) IVPB 3.375 g  Status:  Discontinued        3.375 g 12.5 mL/hr over 240 Minutes Intravenous Every 8 hours 03/24/24 1719 03/25/24 1328   03/24/24 1600  piperacillin -tazobactam (ZOSYN ) IVPB 3.375 g        3.375 g 100 mL/hr over 30 Minutes Intravenous  Once 03/24/24 1550 03/24/24 1643   03/24/24 1600  vancomycin  (VANCOREADY) IVPB 1750 mg/350 mL  1,750 mg 175 mL/hr over 120 Minutes Intravenous  Once 03/24/24 1550 03/24/24 2012        I have personally reviewed the following labs and images: CBC: Recent Labs  Lab 03/24/24 1531 03/25/24 0720  WBC 17.5* 9.5  NEUTROABS 15.8*  --   HGB 12.4* 11.2*  HCT 36.2* 31.9*  MCV 97.3 96.7  PLT 78* 61*   BMP &GFR Recent Labs  Lab 03/24/24 1531 03/25/24 0720 03/25/24 2017  NA 135 131*  --   K 3.3* 3.3*  --   CL 101 103  --   CO2 22 21*  --   GLUCOSE 198* 132*  --   BUN 12 15  --   CREATININE 0.97 0.85  --   CALCIUM  8.8* 8.2*  --   MG  --  1.3* 2.1   Estimated Creatinine Clearance: 115 mL/min (by C-G formula based on SCr of 0.85 mg/dL). Liver & Pancreas: Recent Labs  Lab 03/24/24 1531 03/25/24 0720  AST 32 26  ALT 16 14  ALKPHOS 86 70  BILITOT 2.9* 2.1*  PROT 6.6 5.7*  ALBUMIN 3.2* 2.6*   No results for input(s): LIPASE, AMYLASE in the last 168  hours. Recent Labs  Lab 03/24/24 1533  AMMONIA 31   Diabetic: Recent Labs    03/24/24 1855  HGBA1C 5.7*   Recent Labs  Lab 03/25/24 0743 03/25/24 1126 03/25/24 1545 03/25/24 1953 03/26/24 0602  GLUCAP 142* 115* 125* 170* 139*   Cardiac Enzymes: No results for input(s): CKTOTAL, CKMB, CKMBINDEX, TROPONINI in the last 168 hours. No results for input(s): PROBNP in the last 8760 hours. Coagulation Profile: Recent Labs  Lab 03/24/24 1531 03/25/24 0720  INR 1.3* 1.6*   Thyroid Function Tests: No results for input(s): TSH, T4TOTAL, FREET4, T3FREE, THYROIDAB in the last 72 hours. Lipid Profile: No results for input(s): CHOL, HDL, LDLCALC, TRIG, CHOLHDL, LDLDIRECT in the last 72 hours. Anemia Panel: No results for input(s): VITAMINB12, FOLATE, FERRITIN, TIBC, IRON, RETICCTPCT in the last 72 hours. Urine analysis:    Component Value Date/Time   COLORURINE AMBER (A) 03/25/2024 1355   APPEARANCEUR CLEAR 03/25/2024 1355   LABSPEC 1.031 (H) 03/25/2024 1355   PHURINE 5.0 03/25/2024 1355   GLUCOSEU NEGATIVE 03/25/2024 1355   HGBUR NEGATIVE 03/25/2024 1355   BILIRUBINUR NEGATIVE 03/25/2024 1355   KETONESUR NEGATIVE 03/25/2024 1355   PROTEINUR NEGATIVE 03/25/2024 1355   NITRITE NEGATIVE 03/25/2024 1355   LEUKOCYTESUR NEGATIVE 03/25/2024 1355   Sepsis Labs: Invalid input(s): PROCALCITONIN, LACTICIDVEN  Microbiology: Recent Results (from the past 240 hours)  Blood Culture (routine x 2)     Status: Abnormal (Preliminary result)   Collection Time: 03/24/24  1:42 PM   Specimen: BLOOD  Result Value Ref Range Status   Specimen Description BLOOD LEFT ANTECUBITAL  Final   Special Requests   Final    BOTTLES DRAWN AEROBIC AND ANAEROBIC Blood Culture adequate volume   Culture  Setup Time   Final    GRAM POSITIVE COCCI IN CLUSTERS AEROBIC BOTTLE ONLY CRITICAL RESULT CALLED TO, READ BACK BY AND VERIFIED WITH: PHARMD CARLEN B [id number]  [id number] FCP    Culture (A)  Final    STAPHYLOCOCCUS AUREUS SUSCEPTIBILITIES TO FOLLOW Performed at [facility], [address] [place], [place] [address]    Report Status PENDING  Incomplete  Blood Culture ID Panel (Reflexed)     Status: Abnormal   Collection Time: 03/24/24  1:42 PM  Result Value Ref Range Status  Enterococcus faecalis NOT DETECTED NOT DETECTED Final   Enterococcus Faecium NOT DETECTED NOT DETECTED Final   Listeria monocytogenes NOT DETECTED NOT DETECTED Final   Staphylococcus species DETECTED (A) NOT DETECTED Final    Comment: CRITICAL RESULT CALLED TO, READ BACK BY AND VERIFIED WITH: PHARMD CARLEN B [id number] FCP    Staphylococcus aureus (BCID) DETECTED (A) NOT DETECTED Final    Comment: Methicillin (oxacillin)-resistant Staphylococcus aureus (MRSA). MRSA is predictably resistant to beta-lactam antibiotics (except ceftaroline). Preferred therapy is vancomycin  unless clinically contraindicated. Patient requires contact precautions if  hospitalized. CRITICAL RESULT CALLED TO, READ BACK BY AND VERIFIED WITH: PHARMD CARLEN B [id number] FCP    Staphylococcus epidermidis NOT DETECTED NOT DETECTED Final   Staphylococcus lugdunensis NOT DETECTED NOT DETECTED Final   Streptococcus species NOT DETECTED NOT DETECTED Final   Streptococcus agalactiae NOT DETECTED NOT DETECTED Final   Streptococcus pneumoniae NOT DETECTED NOT DETECTED Final   Streptococcus pyogenes NOT DETECTED NOT DETECTED Final   A.calcoaceticus-baumannii NOT DETECTED NOT DETECTED Final   Bacteroides fragilis NOT DETECTED NOT DETECTED Final   Enterobacterales NOT DETECTED NOT DETECTED Final   Enterobacter cloacae complex NOT DETECTED NOT DETECTED Final   Escherichia coli NOT DETECTED NOT DETECTED Final   Klebsiella aerogenes NOT DETECTED NOT DETECTED Final   Klebsiella oxytoca NOT DETECTED NOT DETECTED Final   Klebsiella pneumoniae NOT DETECTED NOT DETECTED Final   Proteus species NOT DETECTED  NOT DETECTED Final   Salmonella species NOT DETECTED NOT DETECTED Final   Serratia marcescens NOT DETECTED NOT DETECTED Final   Haemophilus influenzae NOT DETECTED NOT DETECTED Final   Neisseria meningitidis NOT DETECTED NOT DETECTED Final   Pseudomonas aeruginosa NOT DETECTED NOT DETECTED Final   Stenotrophomonas maltophilia NOT DETECTED NOT DETECTED Final   Candida albicans NOT DETECTED NOT DETECTED Final   Candida auris NOT DETECTED NOT DETECTED Final   Candida glabrata NOT DETECTED NOT DETECTED Final   Candida krusei NOT DETECTED NOT DETECTED Final   Candida parapsilosis NOT DETECTED NOT DETECTED Final   Candida tropicalis NOT DETECTED NOT DETECTED Final   Cryptococcus neoformans/gattii NOT DETECTED NOT DETECTED Final   Meth resistant mecA/C and MREJ DETECTED (A) NOT DETECTED Final    Comment: CRITICAL RESULT CALLED TO, READ BACK BY AND VERIFIED WITH: MAYA HARP B [id number] FCP Performed at [facility], [address] [place], [place] [address]   Resp panel by RT-PCR (RSV, Flu A&B, Covid) Anterior Nasal Swab     Status: None   Collection Time: 03/24/24  1:43 PM   Specimen: Anterior Nasal Swab  Result Value Ref Range Status   SARS Coronavirus 2 by RT PCR NEGATIVE NEGATIVE Final   Influenza A by PCR NEGATIVE NEGATIVE Final   Influenza B by PCR NEGATIVE NEGATIVE Final    Comment: (NOTE) The Xpert Xpress SARS-CoV-2/FLU/RSV plus assay is intended as an aid in the diagnosis of influenza from Nasopharyngeal swab specimens and should not be used as a sole basis for treatment. Nasal washings and aspirates are unacceptable for Xpert Xpress SARS-CoV-2/FLU/RSV testing.  Fact Sheet for Patients: BloggerCourse.com  Fact Sheet for Healthcare Providers: SeriousBroker.it  This test is not yet approved or cleared by the [place]  FDA and has been authorized for detection and/or diagnosis of SARS-CoV-2 by FDA under an  Emergency Use Authorization (EUA). This EUA will remain in effect (meaning this test can be used) for the duration of the COVID-19 declaration under Section 564(b)(1) of the Act, 21 U.S.C. section  360bbb-3(b)(1), unless the authorization is terminated or revoked.     Resp Syncytial Virus by PCR NEGATIVE NEGATIVE Final    Comment: (NOTE) Fact Sheet for Patients: BloggerCourse.com  Fact Sheet for Healthcare Providers: SeriousBroker.it  This test is not yet approved or cleared by the [place]  FDA and has been authorized for detection and/or diagnosis of SARS-CoV-2 by FDA under an Emergency Use Authorization (EUA). This EUA will remain in effect (meaning this test can be used) for the duration of the COVID-19 declaration under Section 564(b)(1) of the Act, 21 U.S.C. section 360bbb-3(b)(1), unless the authorization is terminated or revoked.  Performed at [facility], [address], [place], [place] [address]   MRSA Next Gen by PCR, Nasal     Status: None   Collection Time: 03/24/24  5:06 PM   Specimen: Nasal Mucosa; Nasal Swab  Result Value Ref Range Status   MRSA by PCR Next Gen NOT DETECTED NOT DETECTED Final    Comment: (NOTE) The GeneXpert MRSA Assay (FDA approved for NASAL specimens only), is one component of a comprehensive MRSA colonization surveillance program. It is not intended to diagnose MRSA infection nor to guide or monitor treatment for MRSA infections. Test performance is not FDA approved in patients less than 44 years old. Performed at [facility], [address], [place], [place] [address]   Blood Culture (routine x 2)     Status: None (Preliminary result)   Collection Time: 03/24/24  6:55 PM   Specimen: BLOOD  Result Value Ref Range Status   Specimen Description BLOOD BLOOD RIGHT HAND  Final   Special Requests   Final    BOTTLES DRAWN AEROBIC AND ANAEROBIC Blood Culture adequate volume    Culture   Final    NO GROWTH 2 DAYS Performed at [facility], [address], [place], [place] [address]    Report Status PENDING  Incomplete    Radiology Studies: No results found.    Tremont Gavitt T. Emmette Katt [facility]  If 7PM-7AM, please contact night-coverage [email or web address] 03/26/2024, 10:07 AM

## 2024-03-26 NOTE — TOC Initial Note (Addendum)
 Transition of Care [facility]) - Initial/Assessment Note    Patient Details  Name: Cory Palmer MRN: [id number] Date of Birth: 1960-09-13  [facility] CM/SW Contact:    Andrez JULIANNA George, RN Phone Number: 03/26/2024, 1:38 PM  Clinical Narrative:                  Pt is homeless. He says he stays by the [employer]. He uses the public bus for transportation but says they have been limiting him due to the condition of his wheelchair. CM has asked [employer] to switch out his wheelchair. They will do this while he is here. CM has also added his medicaid transportation information to his AVS.   Pt doesn't have a phone. He says he can get one after the 1st of the month. CM did submit a referral to [facility] with pt permission to see if housing assistance can be provided. Unable to put phone number on referral but did put his usual location on the referral.  CM also sent a referral to [facility].   Pt does receive disability check and food stamps.   Pt states he sees Dr Delores and says he takes some of his medications. He has issues getting to the pharmacy to get his meds. Will need d/c meds filled at the hospital prior to his leaving.   Pt will need safe transport at d/c to get him to the [employer].   IP Care management following.  SDOH Interventions Today    Flowsheet Row Most Recent Value  SDOH Interventions   Food Insecurity Interventions Inpatient TOC, [employer] Referral  Housing Interventions Inpatient TOC, [employer] Resource Referral  Transportation Interventions Inpatient TOC     Expected Discharge Plan: Homeless Shelter Barriers to Discharge: Continued Medical Work up   Patient Goals and CMS Choice            Expected Discharge Plan and Services   Discharge Planning Services: CM Consult   Living arrangements for the past 2 months: Homeless                                      Prior Living  Arrangements/Services Living arrangements for the past 2 months: Homeless Lives with:: Self Patient language and need for interpreter reviewed:: Yes Do you feel safe going back to the place where you live?: No   Homeless    Care giver support system in place?: No (comment) Current home services: DME Furniture conservator/restorer) Criminal Activity/Legal Involvement Pertinent to Current Situation/Hospitalization: No - Comment as needed  Activities of Daily Living      Permission Sought/Granted                  Emotional Assessment Appearance:: Appears stated age Attitude/Demeanor/Rapport: Crying Affect (typically observed): Tearful/Crying Orientation: : Oriented to Self, Oriented to Place, Oriented to Situation, Oriented to  Time   Psych Involvement: No (comment)  Admission diagnosis:  Traumatic subdural hematoma (SDH) (HCC) [S06.5XAA] Patient Active Problem List   Diagnosis Date Noted   Traumatic subdural hematoma (SDH) (HCC) 03/24/2024   GAD (generalized anxiety disorder) 06/08/2023   Acute pulmonary embolism (HCC) 12/13/2022   Tobacco use 12/13/2022   Substance induced mood disorder (HCC) 11/08/2022   Pneumonia 08/26/2022   Suicide ideation 06/26/2022   Alcohol abuse 04/10/2022   Cocaine abuse with cocaine-induced mood disorder (HCC) 04/10/2022   Adjustment disorder  with mixed anxiety and depressed mood 04/10/2022   Alcohol-induced mood disorder (HCC) 04/10/2022   Severe sepsis (HCC) 03/24/2022   Abdominal pain 03/24/2022   Nausea vomiting and diarrhea 03/24/2022   Prolonged QT interval 03/24/2022   Macrocytosis without anemia 03/24/2022   Sepsis (HCC) 03/24/2022   Acute hepatic encephalopathy (HCC) 11/17/2021   PVD (peripheral vascular disease) (HCC) 11/17/2021   Metabolic acidosis 11/17/2021   Esophagitis 11/17/2021   Acute metabolic encephalopathy 11/11/2021   Thrombocytopenia (HCC) 11/11/2021   Leukocytosis 10/10/2020   GERD (gastroesophageal reflux disease) 10/10/2020    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HCC) 05/06/2020   Hyponatremia 10/23/2019   MRSA bacteremia 10/21/2019   Atrial flutter with rapid ventricular response (HCC) 10/21/2019   Lactic acidosis 10/21/2019   Severe protein-calorie malnutrition (HCC)    Diabetic polyneuropathy associated with type 2 diabetes mellitus (HCC)    Cirrhosis of liver (HCC) 10/19/2019   Anemia 10/19/2019   Medication monitoring encounter 08/15/2019   Serum total bilirubin elevated 10/29/2018   BPH (benign prostatic hyperplasia) 10/29/2018   Below-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)    PAF (paroxysmal atrial fibrillation) (HCC)    Hypokalemia    Hypomagnesemia    Sepsis without acute organ dysfunction (HCC) 10/05/2018   Depressed bipolar disorder (HCC) 10/05/2018   Hypertension 10/05/2018   Hyperbilirubinemia 10/05/2018   Homelessness 05/07/2018   Diabetes mellitus type II, non insulin  dependent (HCC) 05/07/2018   PCP:  Delores Rojelio Caldron, NP Pharmacy:   [id number] - [place], [place] - [address] AT [address] [place]  [address] Phone: [phone] Fax: [phone]     Social Drivers of Health (SDOH) Social History: SDOH Screenings   Food Insecurity: Food Insecurity Present (01/15/2024)  Housing: High Risk (01/15/2024)  Transportation Needs: Unmet Transportation Needs (01/15/2024)  Utilities: Not At Risk (01/15/2024)  Depression (PHQ2-9): High Risk (05/16/2022)  Financial Resource Strain: Not on File (11/18/2021)   Received from [facility]  Physical Activity: Not on File (11/18/2021)   Received from [facility]  Social Connections: Not on File (04/15/2023)   Received from [facility]  Stress: Not on File (11/18/2021)   Received from [facility]  Tobacco Use: High Risk (03/24/2024)   SDOH Interventions:     Readmission Risk Interventions    11/18/2021    4:15 PM  Readmission Risk Prevention Plan  Transportation Screening Complete  Medication Review (RN Care  Manager) Complete  PCP or Specialist appointment within 3-5 days of discharge Complete  HRI or Home Care Consult Complete  SW Recovery Care/Counseling Consult Complete  Palliative Care Screening Not Applicable  Skilled Nursing Facility --

## 2024-03-26 NOTE — Progress Notes (Signed)
 Patient has not urinated since coming to the floor, Elink updated. Bl;adders can shows 323 ml, patient states he feels the urge but does not feel the need for it to come out.

## 2024-03-26 NOTE — Progress Notes (Incomplete)
 Pharmacy Antibiotic Note  Cory Palmer is a 63 y.o. male admitted on 03/24/2024 with {Indications:3041527}.  Pharmacy has been consulted for *** dosing.  Plan: {Assessment:21075}  Weight: 112 kg (246 lb 14.6 oz)  Temp (24hrs), Avg:99.3 F (37.4 C), Min:98.2 F (36.8 C), Max:100.5 F (38.1 C)  Recent Labs  Lab 03/24/24 1531 03/24/24 1546 03/24/24 1855 03/24/24 2034 03/25/24 0720 03/26/24 1012  WBC 17.5*  --   --   --  9.5 5.6  CREATININE 0.97  --   --   --  0.85 0.67  LATICACIDVEN  --  2.4* 2.3* 2.3*  --   --     Estimated Creatinine Clearance: 122.2 mL/min (by C-G formula based on SCr of 0.67 mg/dL).    Allergies  Allergen Reactions   Atorvastatin Calcium  Nausea And Vomiting   Gemfibrozil     Unknown   Insulin  Glargine     Unknown    Antimicrobials this admission: *** *** >> *** *** *** >> ***  Dose adjustments this admission: ***  Microbiology results: *** BCx: *** *** UCx: ***  *** Sputum: ***  *** MRSA PCR: ***  Thank you for allowing pharmacy to be a part of this patient's care.  Gladis Almarie Caldron 03/26/2024 3:15 PM

## 2024-03-26 NOTE — Progress Notes (Signed)
    Durable Medical Equipment  (From admission, onward)           Start     Ordered   03/26/24 1156  For home use only DME standard manual wheelchair with seat cushion  Once       Comments: Patient suffers from bilateral BKA which impairs their ability to perform daily activities like bathing and dressing in the home.  A walker will not resolve issue with performing activities of daily living. A wheelchair will allow patient to safely perform daily activities. Patient can safely propel the wheelchair in the home or has a caregiver who can provide assistance. Length of need Lifetime. Accessories: elevating leg rests (ELRs), wheel locks, extensions and anti-tippers.   03/26/24 1155

## 2024-03-26 NOTE — TOC CAGE-AID Note (Signed)
 Transition of Care [facility]) - CAGE-AID Screening   Patient Details  Name: Cory Palmer MRN: [id number] Date of Birth: [birth year]  Transition of Care [facility]) CM/SW Contact:    Andrez JULIANNA George, RN Phone Number: 03/26/2024, 1:31 PM   Clinical Narrative: CM provided him resources for inpatient/ outpatient alcohol counseling.    CAGE-AID Screening:    Have You Ever Felt You Ought to Cut Down on Your Drinking or Drug Use?: No Have People Annoyed You By Critizing Your Drinking Or Drug Use?: No Have You Felt Bad Or Guilty About Your Drinking Or Drug Use?: No Have You Ever Had a Drink or Used Drugs First Thing In The Morning to Steady Your Nerves or to Get Rid of a Hangover?: No CAGE-AID Score: 0  Substance Abuse Education Offered: Yes

## 2024-03-26 NOTE — Progress Notes (Signed)
 Bladder scan at 0555 shows 378, in and out cathetherization done by NT Iesha and noted 925 mls drained.

## 2024-03-26 NOTE — Progress Notes (Signed)
  Echocardiogram 2D Echocardiogram has been performed.  Tinnie FORBES Gosling RDCS 03/26/2024, 10:47 AM

## 2024-03-27 DIAGNOSIS — S065X0A Traumatic subdural hemorrhage without loss of consciousness, initial encounter: Secondary | ICD-10-CM | POA: Diagnosis not present

## 2024-03-27 LAB — CBC WITH DIFFERENTIAL/PLATELET
Abs Immature Granulocytes: 0.02 K/uL (ref 0.00–0.07)
Basophils Absolute: 0 K/uL (ref 0.0–0.1)
Basophils Relative: 0 %
Eosinophils Absolute: 0.2 K/uL (ref 0.0–0.5)
Eosinophils Relative: 5 %
HCT: 31.9 % — ABNORMAL LOW (ref 39.0–52.0)
Hemoglobin: 11.1 g/dL — ABNORMAL LOW (ref 13.0–17.0)
Immature Granulocytes: 0 %
Lymphocytes Relative: 16 %
Lymphs Abs: 0.8 K/uL (ref 0.7–4.0)
MCH: 33.2 pg (ref 26.0–34.0)
MCHC: 34.8 g/dL (ref 30.0–36.0)
MCV: 95.5 fL (ref 80.0–100.0)
Monocytes Absolute: 0.6 K/uL (ref 0.1–1.0)
Monocytes Relative: 11 %
Neutro Abs: 3.6 K/uL (ref 1.7–7.7)
Neutrophils Relative %: 68 %
Platelets: 78 K/uL — ABNORMAL LOW (ref 150–400)
RBC: 3.34 MIL/uL — ABNORMAL LOW (ref 4.22–5.81)
RDW: 13.7 % (ref 11.5–15.5)
WBC: 5.4 K/uL (ref 4.0–10.5)
nRBC: 0 % (ref 0.0–0.2)

## 2024-03-27 LAB — COMPREHENSIVE METABOLIC PANEL WITH GFR
ALT: 20 U/L (ref 0–44)
AST: 39 U/L (ref 15–41)
Albumin: 2.5 g/dL — ABNORMAL LOW (ref 3.5–5.0)
Alkaline Phosphatase: 110 U/L (ref 38–126)
Anion gap: 9 (ref 5–15)
BUN: 14 mg/dL (ref 8–23)
CO2: 18 mmol/L — ABNORMAL LOW (ref 22–32)
Calcium: 8.5 mg/dL — ABNORMAL LOW (ref 8.9–10.3)
Chloride: 107 mmol/L (ref 98–111)
Creatinine, Ser: 0.66 mg/dL (ref 0.61–1.24)
GFR, Estimated: >60 mL/min (ref >60)
Glucose, Bld: 148 mg/dL — ABNORMAL HIGH (ref 70–99)
Potassium: 3.7 mmol/L (ref 3.5–5.1)
Sodium: 134 mmol/L — ABNORMAL LOW (ref 135–145)
Total Bilirubin: 1.4 mg/dL — ABNORMAL HIGH (ref 0.0–1.2)
Total Protein: 5.6 g/dL — ABNORMAL LOW (ref 6.5–8.1)

## 2024-03-27 LAB — GLUCOSE, CAPILLARY
Glucose-Capillary: 110 mg/dL — ABNORMAL HIGH (ref 70–99)
Glucose-Capillary: 107 mg/dL — ABNORMAL HIGH (ref 70–99)
Glucose-Capillary: 163 mg/dL — ABNORMAL HIGH (ref 70–99)
Glucose-Capillary: 143 mg/dL — ABNORMAL HIGH (ref 70–99)

## 2024-03-27 LAB — VANCOMYCIN, TROUGH: Vancomycin Tr: 7 ug/mL — ABNORMAL LOW (ref 15–20)

## 2024-03-27 LAB — CULTURE, BLOOD (ROUTINE X 2): Special Requests: ADEQUATE

## 2024-03-27 MED ORDER — METFORMIN HCL ER 500 MG PO TB24
500.0000 mg | ORAL_TABLET | Freq: Every day | ORAL | Status: DC
  Administered 2024-03-27: 500 mg via ORAL
  Filled 2024-03-27 (×2): qty 1

## 2024-03-27 MED ORDER — DIAZEPAM 2 MG PO TABS: 2.0000 mg | ORAL_TABLET | Freq: Four times a day (QID) | ORAL | Status: DC | PRN

## 2024-03-27 MED ORDER — GABAPENTIN 100 MG PO CAPS
100.0000 mg | ORAL_CAPSULE | Freq: Two times a day (BID) | ORAL | Status: DC
  Administered 2024-03-27 (×2): 100 mg via ORAL
  Filled 2024-03-27 (×2): qty 1

## 2024-03-27 MED ORDER — ENOXAPARIN SODIUM 40 MG/0.4ML IJ SOSY: 40.0000 mg | PREFILLED_SYRINGE | INTRAMUSCULAR | Status: DC

## 2024-03-27 MED ORDER — VANCOMYCIN HCL IN DEXTROSE 1-5 GM/200ML-% IV SOLN
1000.0000 mg | Freq: Three times a day (TID) | INTRAVENOUS | Status: DC
  Filled 2024-03-27: qty 200

## 2024-03-27 MED ORDER — ACETAMINOPHEN 325 MG PO TABS
650.0000 mg | ORAL_TABLET | Freq: Four times a day (QID) | ORAL | Status: DC | PRN
  Administered 2024-03-27: 650 mg via ORAL
  Filled 2024-03-27: qty 2

## 2024-03-27 MED ORDER — VANCOMYCIN HCL 1250 MG/250ML IV SOLN
1250.0000 mg | Freq: Three times a day (TID) | INTRAVENOUS | Status: DC
  Administered 2024-03-27 – 2024-03-28 (×3): 1250 mg via INTRAVENOUS
  Filled 2024-03-27 (×4): qty 250

## 2024-03-27 NOTE — Progress Notes (Addendum)
   [facility] has been requested to perform a transesophageal echocardiogram on Cory Palmer for bacteremia.     Relative Contraindications: underwent upper endoscopy 2022 with Grade D esophagitis with bleeding, few dispersed erosions with stigmata of recent bleeding in the gastric antrum. Nodular friable mucosa in the gastroesophageal junction. MD will need to review chart in the morning to ensure appropriate to continue. May need GI evaluation prior to TEE. Also needs follow up platelet count tomorrow.   The patient has: No other conditions that may impact this procedure.    After careful review of history and examination, the risks and benefits of transesophageal echocardiogram have been explained including risks of esophageal damage, perforation (1:10,000 risk), bleeding, pharyngeal hematoma as well as other potential complications associated with conscious sedation including aspiration, arrhythmia, respiratory failure and death. Alternatives to treatment were discussed, questions were answered. Patient is willing to proceed.   Signed, Manuelita Rummer, NP  03/27/2024 4:50 PM

## 2024-03-27 NOTE — Plan of Care (Signed)
 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 Left ventricular ejection fraction, by estimation, is 50%. The left  ventricle has mildly decreased function. The left ventricle demonstrates  global hypokinesis. The left ventricular internal cavity size was mildly  dilated. There is mild concentric  left ventricular hypertrophy. Left ventricular diastolic parameters are  indeterminate.   2. Right ventricular systolic function is mildly reduced. The right  ventricular size is normal. Tricuspid regurgitation signal is inadequate  for assessing PA pressure.   3. Left atrial size was moderately dilated.   4. The mitral valve is normal in structure. No evidence of mitral valve  regurgitation. No evidence of mitral stenosis.   5. The aortic valve is tricuspid. There is mild calcification of the  aortic valve. Aortic valve regurgitation is not visualized. No aortic  stenosis is present. Possible small vegetation on the aortic valve.  Recommend TEE for better visualization.   6. The inferior vena cava is dilated in size with <50% respiratory  variability, suggesting right atrial pressure of 15 mmHg.   7. The patient was in atrial fibrillation.   8. Technically difficult study with poor acoustic windows.        -plan 6 weeks abx duration -defer tee unless persistent fever or bacteremia -final abx plan/opat to be setup tomorrow pending more finalization of repeat bcx

## 2024-03-27 NOTE — Plan of Care (Signed)
  Problem: Metabolic: Goal: Ability to maintain appropriate glucose levels will improve Outcome: Not Progressing   Problem: Skin Integrity: Goal: Risk for impaired skin integrity will decrease Outcome: Not Progressing   Problem: Tissue Perfusion: Goal: Adequacy of tissue perfusion will improve Outcome: Not Progressing   Problem: Elimination: Goal: Will not experience complications related to bowel motility Outcome: Not Progressing   Problem: Skin Integrity: Goal: Risk for impaired skin integrity will decrease Outcome: Not Progressing

## 2024-03-27 NOTE — Progress Notes (Signed)
 Pharmacy Antibiotic Note  Cory Palmer is a 63 y.o. male admitted on 03/24/2024 with 1/4 MRSA bacteremia with concern for CNS involvement.  Pharmacy has been consulted for Vancomycin  dosing. Vancomycin  trough at steady state this morning was 7 which was lower than expected. Based on timing of the level, trough was estimated to be closer to 8. Patient is clearing vancomycin  faster than expected using estimations with population kinetics. Based on trough of 7, we are increasing the dose of vancomycin  to target a trough of 15-20 for MRSA bacteremia with suspected CNS involvement.  Plan: -Increase vancomycin  to 1250mg  every 8 hours  -Monitor renal function and clinical improvement, collect levels as appropriate  Weight: 109.7 kg (241 lb 13.5 oz)  Temp (24hrs), Avg:98.5 F (36.9 C), Min:98.1 F (36.7 C), Max:98.8 F (37.1 C)  Recent Labs  Lab 03/24/24 1531 03/24/24 1546 03/24/24 1855 03/24/24 2034 03/25/24 0720 03/26/24 1012 03/27/24 0545  WBC 17.5*  --   --   --  9.5 5.6  --   CREATININE 0.97  --   --   --  0.85 0.67  --   LATICACIDVEN  --  2.4* 2.3* 2.3*  --   --   --   VANCOTROUGH  --   --   --   --   --   --  7*    Estimated Creatinine Clearance: 120.8 mL/min (by C-G formula based on SCr of 0.67 mg/dL).    Allergies  Allergen Reactions   Atorvastatin Calcium  Nausea And Vomiting   Gemfibrozil     Unknown   Insulin  Glargine     Unknown    Antimicrobials this admission: Vancomycin  8/24 >>  Zosyn  8/24 >> 8/25  Microbiology results: 8/26 BCx: no growth 1 day 8/24 Bcx: 1/4 MRSA 8/24 MRSA PCR: not detected  Thank you for allowing pharmacy to be involved with this patient's care.  Mendel Barter, PharmD PGY1 Clinical Pharmacist [facility]  03/27/2024 2:46 PM

## 2024-03-27 NOTE — Evaluation (Signed)
 Physical Therapy Evaluation Patient Details Name: Cory Palmer MRN: [id number] DOB: 1960-09-15 Today's Date: 03/27/2024  History of Present Illness  63 y.o. male presents to [facility] 03/24/24 after fall from [facility] with AMS and code sepsis. CT head showed progression of prior B SDH from 6/25 and new 14 mm L frontal lobe SDH. Also with displaced bilateral nasal bone, anterior wall left maxillary sinus and left lateral orbital rim fractures. Acute metabolic encephalopathy, UDS + benzo. PMHx: B BKA, a-fib, alcohol abuse, homelessness, DMT2, PE/PAF, polysubstance abuse   Clinical Impression  PTA, pt was homeless and would use a manual WC for mobility. Reports transferring from ground/WC and doing lateral scoots at baseline. Pt has had frequent falls due to having a small WC with malfunctioning brakes. Pt declined wanting to move out of bed, but was agreeable to move into long-sitting with ModI and use of rails. Will continue to follow to progress mobility and ensure pt is ModI for transfers. Acute PT to follow.       If plan is discharge home, recommend the following: Assist for transportation;A little help with walking and/or transfers;A little help with bathing/dressing/bathroom     Equipment Recommendations None recommended by PT     Functional Status Assessment Patient has had a recent decline in their functional status and demonstrates the ability to make significant improvements in function in a reasonable and predictable amount of time.     Precautions / Restrictions Precautions Precautions: Fall Precaution/Restrictions Comments: B BKA Restrictions Weight Bearing Restrictions Per Provider Order: No      Mobility  Bed Mobility Overal bed mobility: Needs Assistance Bed Mobility: Supine to Sit    Supine to sit: Modified independent (Device/Increase time), Used rails    General bed mobility comments: moved into long-sitting with use of one handrail, declined further bed mobility/transfer     Transfers  General transfer comment: Pt declined       Balance Overall balance assessment: History of Falls       Pertinent Vitals/Pain Pain Assessment Pain Assessment: No/denies pain    Home Living Family/patient expects to be discharged to:: Shelter/Homeless    Additional Comments: Stays on street by [facility]    Prior Function Prior Level of Function : Independent/Modified Independent;History of Falls (last six months)    Mobility Comments: frequent falls due to small WC without functioning brakes. Transfers from ground to [facility] or does lateral scoots with no AD. Appointment with [employer] to get B prostheses       Extremity/Trunk Assessment   Upper Extremity Assessment Upper Extremity Assessment: Defer to OT evaluation    Lower Extremity Assessment Lower Extremity Assessment: RLE deficits/detail;LLE deficits/detail RLE Deficits / Details: BKA, able to SLR with good quad contraction LLE Deficits / Details: BKA, able to SLR with good quad contraction       Communication   Communication Communication: Impaired Factors Affecting Communication: Reduced clarity of speech    Cognition Arousal: Alert Behavior During Therapy: Restless, Agitated   PT - Cognitive impairments: No apparent impairments    PT - Cognition Comments: Distracted by needing to get out of the hospital to get check Following commands: Intact       Cueing Cueing Techniques: Verbal cues     General Comments General comments (skin integrity, edema, etc.): VSS on RA     PT Assessment Patient needs continued PT services  PT Problem List Decreased activity tolerance;Decreased balance;Decreased mobility       PT Treatment Interventions DME instruction;Functional mobility  training;Therapeutic activities;Therapeutic exercise;Balance training;Neuromuscular re-education;Patient/family education;Wheelchair mobility training    PT Goals (Current goals can be found in the Care Plan section)   Acute Rehab PT Goals Patient Stated Goal: to get out of the hospital PT Goal Formulation: With patient Time For Goal Achievement: 04/10/24 Potential to Achieve Goals: Good    Frequency Min 2X/week        AM-PAC PT 6 Clicks Mobility  Outcome Measure Help needed turning from your back to your side while in a flat bed without using bedrails?: A Little Help needed moving from lying on your back to sitting on the side of a flat bed without using bedrails?: A Little Help needed moving to and from a bed to a chair (including a wheelchair)?: A Lot Help needed standing up from a chair using your arms (e.g., wheelchair or bedside chair)?: Total Help needed to walk in hospital room?: Total Help needed climbing 3-5 steps with a railing? : Total 6 Click Score: 11    End of Session   Activity Tolerance: Other (comment) (pt limiting treatment) Patient left: in bed;with call bell/phone within reach;with bed alarm set Nurse Communication: Mobility status PT Visit Diagnosis: Other abnormalities of gait and mobility (R26.89);History of falling (Z91.81)    Time: 9063-9048 PT Time Calculation (min) (ACUTE ONLY): 15 min   Charges:   PT Evaluation $PT Eval Low Complexity: 1 Low   PT General Charges $$ ACUTE PT VISIT: 1 Visit       Kate ORN, PT, DPT Secure Chat Preferred  Rehab Office [phone]   Kate BRAVO Wendolyn 03/27/2024, 10:48 AM

## 2024-03-27 NOTE — Progress Notes (Signed)
 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4 confused, febrile no meds given in EMS stroke-WBC 17 platelets 78  CT head 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 with new 14 mm left frontal subdural-displaced bilateral nasal bone anterior left maxillary sinus and left lateral orbital rim fractures Apparently not using Eliquis  Neurosurgery consulted recommended watchful waiting Keppra  for seizure prophylaxis blood cultures obtained BC ID 1/4 MRSA placed on Vanco Zosyn  ID consulted 8/25 neurosurgery evaluated patient and felt no further workup needed but hold anticoagulation 8/25 ID saw-blood culture confirmed MRSA narrowed to vancomycin  planning 6 weeks antibiotics determining if TEE still needed  Plan  MRSA bacteremia with severe sepsis on admission 6 weeks of vancomycin  planned per infectious disease-source unclear but could be from his wounds which are in various stages of healing do not look cellulitic however 8/26 blood culture negative so far-further plan per ID in terms of management and planning Patient has voiced that he wants to leave the hospital and wants to be  outdoors I would not discharge him unless we have a clear plan in place and he may need to leave AGAINST MEDICAL ADVICE if he persists in wanting to leave the hospital  Acute 14 mm superimposed on subacute SDH Facial nasal fractures Has had several images last 1 on 8/25 showing no new intracranial hemorrhage no midline shift 1.1 cm left 1.3 on the right secondary mass effect balance no midline shift unchanged 1.4 cm focal high posterior left frontal subdural bleed again noted No further workup per neurosurgery and will follow in the outpatient and keep blood pressure goal below 160 Prophylactic Keppra  500 twice daily  Bilateral BKA with frequent falls Received a new wheelchair this hospital stay states that he was falling out of the chair because it was broken TOC to follow--does receive food stamps and disability check  Metabolic encephalopathy on admission Etiology unclear --UDS positive for benzo-- resolved  Depression Continue Seroquel  300 at bedtime trazodone  50 at bedtime  History of PE paroxysmal A-fib No longer on anticoagulation DC monitors if in sinus continuously  Prediabetes CBGs predominantly below 180 on sliding scale alone  EtOH abuse disorder Alcoholic liver cirrhosis Placed on Valium  initially on 8/26 we will cut back dose to 2 mg every 6 as needed He is not withdrawing and I have discontinued the protocol  Smoker Nicotine  patch ordered  BPH with urinary retention 925 cc on 8/25 PM Flomax  0.4  Class I obesity  Replenished hypokalemia   Data Reviewed:   No labs today  DVT prophylaxis: None patient has subdural  Status is: Inpatient Remains inpatient appropriate because:   Awaiting plan regarding antibiotics then likely will need discharge to homeless shelter and will fill meds here locally  Subjective: Awake coherent no distress looks fair feels well Tells me he wants to leave the hospital Overall looks okay-tells me he is supposed to see  the [employer] rep later this week for bilateral prosthetics    Objective + exam Vitals:   03/26/24 1525 03/26/24 1953 03/26/24 2347 03/27/24 0350  BP: 121/76 129/73 112/67 101/60  Pulse: 84 74 74 87  Resp: 18 18 18 18   Temp: 98.6 F (37 C) 98.6 F (37 C) 98.4 F (36.9 C) 98.1 F (36.7 C)  TempSrc:  Oral Oral Axillary  SpO2: 97% 97% 95% 97%  Weight:    109.7 kg   Filed Weights   03/25/24 1000 03/26/24 0336 03/27/24 0350  Weight: 109.3 kg 112 kg 109.7 kg     Examination: EOMI NCAT thick neck Mallampati 4 well-nourished S1-S2 no murmur Chest is clear Abdomen soft no rebound Bilateral BKA's he does have some bruising and scabbing over the areas of both of them Neuro is intact    Scheduled Meds:  Chlorhexidine  Gluconate Cloth  6 each Topical Daily   insulin  aspart  0-9 Units Subcutaneous TID AC & HS   levETIRAcetam   500 mg Oral BID   nicotine   14 mg Transdermal Daily   QUEtiapine   300 mg Oral QHS   tamsulosin   0.4 mg Oral QPC supper   traZODone   50 mg Oral QHS   Continuous Infusions:  vancomycin  1,000 mg (03/27/24 9375)    Time 25  Colen Grimes, MD  [facility]

## 2024-03-27 NOTE — Evaluation (Signed)
 Occupational Therapy Evaluation Patient Details Name: Cory Palmer MRN: [id number] DOB: [birth year] Today's Date: 03/27/2024   History of Present Illness   63 y.o. male presents to [facility] 03/24/24 after fall from [facility] with AMS and code sepsis. CT head showed progression of prior B SDH from 6/25 and new 14 mm L frontal lobe SDH. Also with displaced bilateral nasal bone, anterior wall left maxillary sinus and left lateral orbital rim fractures. Acute metabolic encephalopathy, UDS + benzo. PMHx: B BKA, a-fib, alcohol abuse, homelessness, DMT2, PE/PAF, polysubstance abuse     Clinical Impressions PTA pt living on the streets @ wc level and has had multiple falls form wc dur to malfunctioning wc. Noted wc does not have functional tites or working brakes. Pt overall set up for ADL at baseline and refused further mobility OOB. Pt's new wc arrived after leaving room and pt stating he would transfer into new wc later this am. Acute OT to follow however anticipate pt will DC back to his previous living situation.  Pt states he has B prosthetic legs at [employer] but needs transportation to the clinic. Noted L residual limb edematous and painful most likely form fall - MD made aware.      If plan is discharge home, recommend the following:         Functional Status Assessment   Patient has had a recent decline in their functional status and demonstrates the ability to make significant improvements in function in a reasonable and predictable amount of time.     Equipment Recommendations   Wheelchair (measurements OT);Wheelchair cushion (measurements OT);Other (comment) (pt received at end of session)     Recommendations for Other Services   Other (comment) (social work consult)     Precautions/Restrictions   Precautions Precautions: Fall Precaution/Restrictions Comments: B BKA Restrictions Weight Bearing Restrictions Per Provider Order: No     Mobility Bed Mobility Overal bed  mobility: Needs Assistance Bed Mobility: Supine to Sit     Supine to sit: Modified independent (Device/Increase time), Used rails     General bed mobility comments: moved into long-sitting with use of one handrail, declined further bed mobility/transfer    Transfers                   General transfer comment: Pt declined      Balance Overall balance assessment: History of Falls                                         ADL either performed or assessed with clinical judgement   ADL Overall ADL's : Needs assistance/impaired Eating/Feeding: Modified independent   Grooming: Set up;Sitting   Upper Body Bathing: Set up;Sitting;Bed level   Lower Body Bathing: Set up;Bed level   Upper Body Dressing : Set up;Sitting   Lower Body Dressing: Set up;Bed level               Functional mobility during ADLs:  (refused)       Vision Baseline Vision/History: 0 No visual deficits       Perception         Praxis         Pertinent Vitals/Pain Pain Assessment Pain Assessment: Faces Faces Pain Scale: Hurts little more Pain Location: L residual limb Pain Descriptors / Indicators: Discomfort, Grimacing, Guarding, Aching Pain Intervention(s): Limited activity within patient's tolerance     Extremity/Trunk Assessment  Upper Extremity Assessment Upper Extremity Assessment: Overall WFL for tasks assessed (LUE slightly decreased in strnegth - using functionally)   Lower Extremity Assessment Lower Extremity Assessment: RLE deficits/detail;LLE deficits/detail RLE Deficits / Details: BKA, able to SLR with good quad contraction LLE Deficits / Details: BKA, able to SLR with good quad contraction   Cervical / Trunk Assessment Cervical / Trunk Assessment: Normal   Communication Communication Communication: Impaired Factors Affecting Communication: Reduced clarity of speech   Cognition Arousal: Alert Behavior During Therapy: Restless,  Agitated Cognition: Difficult to assess             OT - Cognition Comments: aware of sitaution however decreased on-line awareness regarding need to particiapte with therapy to help determine DC needs; unable to redirect until new wc came in after session                 Following commands: Intact       Cueing  General Comments   Cueing Techniques: Verbal cues  VSS on RA   Exercises     Shoulder Instructions      Home Living Family/patient expects to be discharged to:: Shelter/Homeless                                 Additional Comments: Stays on street by [facility]; panhandles for money      Prior Functioning/Environment Prior Level of Function : Independent/Modified Independent;History of Falls (last six months)             Mobility Comments: frequent falls due to small WC without functioning brakes. Transfers from ground to [facility] or does lateral scoots with no AD. Appointment with [employer] to get B prostheses      OT Problem List: Decreased activity tolerance;Decreased safety awareness   OT Treatment/Interventions: Self-care/ADL training;Therapeutic exercise;DME and/or AE instruction;Therapeutic activities;Patient/family education      OT Goals(Current goals can be found in the care plan section)   Acute Rehab OT Goals Patient Stated Goal: leave the hospital and get his money OT Goal Formulation: With patient Time For Goal Achievement: 04/11/24 Potential to Achieve Goals: Good   OT Frequency:  Min 2X/week    Co-evaluation              AM-PAC OT 6 Clicks Daily Activity     Outcome Measure Help from another person eating meals?: None Help from another person taking care of personal grooming?: A Little Help from another person toileting, which includes using toliet, bedpan, or urinal?: A Little Help from another person bathing (including washing, rinsing, drying)?: A Little Help from another person to put on and  taking off regular upper body clothing?: A Little Help from another person to put on and taking off regular lower body clothing?: A Little 6 Click Score: 19   End of Session Nurse Communication: Mobility status  Activity Tolerance: Other (comment) (pt limited participation) Patient left: in bed;Other (comment);with bed alarm set;with call bell/phone within reach  OT Visit Diagnosis: Other abnormalities of gait and mobility (R26.89);Other symptoms and signs involving the nervous system (R29.898)                Time: 9062-9043 OT Time Calculation (min): 19 min Charges:  OT General Charges $OT Visit: 1 Visit OT Evaluation $OT Eval Moderate Complexity: 1 Mod  Dinesh Ulysse, OT/L   Acute OT Clinical Specialist Acute Rehabilitation Services Pager [phone] Office [phone]   [facility] 03/27/2024,  1:50 PM

## 2024-03-27 NOTE — Progress Notes (Signed)
 Occupational Therapy Treatment Note  Pt appropriate this session, able to complete LB dressing @ bed level and transfer into his new wc using ant/post transfer technique with cues to lock brakes. Pt will need to be able to transfer from floor to chair to safely DC back to his homeless situation.  Acute OT to follow.     03/27/24 1400  OT Visit Information  Last OT Received On 03/27/24  Assistance Needed +1  History of Present Illness 63 y.o. male presents to [facility] 03/24/24 after fall from [facility] with AMS and code sepsis. CT head showed progression of prior B SDH from 6/25 and new 14 mm L frontal lobe SDH. Also with displaced bilateral nasal bone, anterior wall left maxillary sinus and left lateral orbital rim fractures. Acute metabolic encephalopathy, UDS + benzo. PMHx: B BKA, a-fib, alcohol abuse, homelessness, DMT2, PE/PAF, polysubstance abuse  Precautions  Precautions Fall  Precaution/Restrictions Comments B BKA  Pain Assessment  Pain Assessment Faces  Faces Pain Scale 4  Pain Location L residual limb  Pain Descriptors / Indicators Discomfort;Grimacing;Guarding;Aching  Pain Intervention(s) Limited activity within patient's tolerance  Cognition  Arousal Alert  Behavior During Therapy WFL for tasks assessed/performed  Cognition No family/caregiver present to determine baseline  OT - Cognition Comments improved participation; decreased on-line awareness however most likely impared at baseline  Following Commands  Following commands Intact  ADL  Functional mobility during ADLs Supervision/safety;Wheelchair  General ADL Comments completed LB dressing with set up at bed level rolling side-side; may benefit from a reacher  Bed Mobility  Overal bed mobility Modified Independent  Transfers  Overall transfer level Needs assistance  Transfers Bed to chair/wheelchair/BSC  Bed to/from chair/wheelchair/BSC transfer type: Anterior-posterior transfer  Anterior-Posterior transfers Supervision  General  transfer comment cues ot lock brakes  Balance  Overall balance assessment History of Falls  General Comments  General comments (skin integrity, edema, etc.) pt pushed himself @ 50 ft independently; states he does not want to use footrests; left with pt going through his belongings taking out dirty clothes, trsh and sorting through bags to put his bags on his new wc  OT - End of Session  Equipment Utilized During Treatment Other (comment) (wc)  Activity Tolerance Patient tolerated treatment well  Patient left Other (comment) (wc)  Nurse Communication Mobility status  OT Assessment/Plan  OT Visit Diagnosis Other abnormalities of gait and mobility (R26.89);Other symptoms and signs involving the nervous system (R29.898)  OT Frequency (ACUTE ONLY) Min 2X/week  Recommendations for Other Services  (social work)  Follow Up Recommendations No OT follow up  IT trainer (measurements OT);Wheelchair cushion (measurements OT);Other (comment)  AM-PAC OT 6 Clicks Daily Activity Outcome Measure (Version 2)  Help from another person eating meals? 4  Help from another person taking care of personal grooming? 3  Help from another person toileting, which includes using toliet, bedpan, or urinal? 3  Help from another person bathing (including washing, rinsing, drying)? 3  Help from another person to put on and taking off regular upper body clothing? 3  Help from another person to put on and taking off regular lower body clothing? 3  6 Click Score 19  Progressive Mobility  What is the highest level of mobility based on the mobility assessment? Level 2 (Chairfast) - Balance while sitting on edge of bed and cannot stand  Activity Pivoted/transferred from bed to chair  OT Goal Progression  Progress towards OT goals Progressing toward goals  Acute Rehab OT Goals  Patient  Stated Goal dc to the streets  OT Goal Formulation With patient  Time For Goal Achievement 04/11/24  Potential to Achieve  Goals Good  ADL Goals  Pt Will Perform Lower Body Bathing with modified independence;bed level  Pt Will Perform Lower Body Dressing with modified independence;bed level  Pt Will Transfer to Toilet with modified independence  Additional ADL Goal #1 Verbalize 2 strategies to reduce risk of falls  OT Time Calculation  OT Start Time (ACUTE ONLY) 1140  OT Stop Time (ACUTE ONLY) 1206  OT Time Calculation (min) 26 min  OT General Charges  $OT Visit 1 Visit  OT Treatments  $Self Care/Home Management  23-37 mins   Kreg Sink, OT/L   Acute OT Clinical Specialist Acute Rehabilitation Services Pager [phone] Office [phone]

## 2024-03-28 ENCOUNTER — Other Ambulatory Visit (HOSPITAL_COMMUNITY)

## 2024-03-28 DIAGNOSIS — S065X0A Traumatic subdural hemorrhage without loss of consciousness, initial encounter: Secondary | ICD-10-CM | POA: Diagnosis not present

## 2024-03-28 LAB — GLUCOSE, CAPILLARY: Glucose-Capillary: 182 mg/dL — ABNORMAL HIGH (ref 70–99)

## 2024-03-28 SURGERY — TRANSESOPHAGEAL ECHOCARDIOGRAM (TEE) (CATHLAB)
Anesthesia: Monitor Anesthesia Care

## 2024-03-28 MED ORDER — SODIUM CHLORIDE 0.9 % IV SOLN: INTRAVENOUS | Status: DC

## 2024-03-28 NOTE — Discharge Summary (Addendum)
 Physician Discharge Summary  Cory Palmer FMW:[id number] DOB: 03-05-1961 DOA: 03/24/2024  PCP: Cory Rojelio Caldron, NP  Admit date: 03/24/2024 Discharge date: 03/28/2024  Time spent: 35 minutes  Recommendations for Outpatient Follow-up:  No recommendations for weight-patient is at very high risk for death debility  Discharge Diagnoses:  MAIN problem for hospitalization   MRSA sepsis   Please see below for itemized issues addressed in HOpsital- refer to other progress notes for clarity if needed  Discharge Condition: Extremely guarded  Diet recommendation: No recommendations made  Filed Weights   03/25/24 1000 03/26/24 0336 03/27/24 0350  Weight: 109.3 kg 112 kg 109.7 kg    History of present illness:  63 year old white male 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 Secondary gain with drug use/abuse and frequent maladaptive coping skills with reported SI/HI-and verifiable if actually has that per psychiatry note 06/12/2023 Paroxysmal A-fib not on anticoagulation CHADVASC >4 presumed secondary to chronic thrombocytopenia from EtOH/hepatic cirrhosis Reflux with history of gastritis and duodenal ulcers and esophageal plaques consistent with candidiasis on 09/2020 admission EGD showed grade D reflux esophagitis and Candida Tobacco abuse Chronic thrombocytopenia Previous admissions for hepatic encephalopathy   Cory Palmer out of wheelchair on [address] Tmax 101.4 confused, febrile no meds given in EMS stroke-WBC 17 platelets 78  CT head 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 with new 14 mm left frontal subdural-displaced bilateral nasal bone anterior left maxillary sinus and left lateral orbital rim fractures Apparently not using Eliquis  Neurosurgery consulted recommended watchful waiting Keppra  for seizure prophylaxis blood cultures obtained [facility] ID 1/4 MRSA placed on Vanco Zosyn  ID consulted 8/25 neurosurgery evaluated patient and felt no further workup needed but hold  anticoagulation 8/25 ID saw-blood culture confirmed MRSA narrowed to vancomycin  planning 6 weeks antibiotics determining if TEE still needed  He had MRSA bacteremia and we planned under guidance of infectious disease for 6 weeks of antibiotics and a TEE was planned for 8/28 however patient decided to leave AGAINST MEDICAL ADVICE Note that the patient came in with acute 14 mm subdural superimposed on chronic ones and was told to keep on Keppra  and stay on blood pressure meds He was also told that he needed to be careful with his wounds and in the hospital to complete the full 6 weeks of antibiotics  It is uncertain why he left the hospital-I came to the floor at around 8:30 AM and he was wheeling himself out in his wheelchair with his belongings and stating to me you told me I could go today why did you tell me that I mention to him that I had not said no such thing It is unclear what his plans are-he did mention yesterday that he had an appointment with Beryl at [facility] for his prosthetic  He does have a nurse practitioner who sees him in the outpatient setting I took a picture of his wound yesterday after being alerted by the nurse and I think that this was the source of his MRSA He knows where we are if he needs to come back-I have mentioned to him that he needs to stay and he still insists on leaving       Discharge Exam: Vitals:   03/28/24 0403 03/28/24 0757  BP: 118/84 (!) 99/59  Pulse: 83 (!) 104  Resp: 18 16  Temp: 98.3 F (36.8 C) 97.9 F (36.6 C)  SpO2: 95% 100%    Subj on day of d/c   Disgruntled and leaving-did not allow exam   Discharge Instructions  No instructions given as patient chose to exercises right to leave the hospital AGAINST MEDICAL ADVICE after signing the form and being fully informed by nursing staff of the same  Allergies  Allergen Reactions   Atorvastatin Calcium  Nausea And Vomiting   Gemfibrozil     Unknown   Insulin  Glargine      Unknown    Follow-up Information     [facility] complete transportation Follow up.   Why: Call 2-3 days in advance to schedule transportation. Contact information: [phone]                 The results of significant diagnostics from this hospitalization (including imaging, microbiology, ancillary and laboratory) are listed below for reference.    Significant Diagnostic Studies: ECHOCARDIOGRAM COMPLETE Result Date: 03/26/2024    ECHOCARDIOGRAM REPORT   Patient Name:   Cory Palmer Date of Exam: 03/26/2024 Medical Rec #:  [id number]     Height:       72.0 in Accession #:    [id number]    Weight:       246.9 lb Date of Birth:  Jan 20, 1961    BSA:          2.330 m Patient Age:    63 years      BP:           108/70 mmHg Patient Gender: M             HR:           83 bpm. Exam Location:  Inpatient Procedure: 2D Echo, Cardiac Doppler and Color Doppler (Both Spectral and Color            Flow Doppler were utilized during procedure). Indications:    Bacteremia  History:        Patient has prior history of Echocardiogram examinations, most                 recent 12/14/2022. Arrythmias:Atrial Fibrillation; Risk                 Factors:Diabetes and Hypertension.  Sonographer:    Tinnie Gosling RDCS Referring Phys: [id number] GREGORY D CALONE IMPRESSIONS  1. Left ventricular ejection fraction, by estimation, is 50%. The left ventricle has mildly decreased function. The left ventricle demonstrates global hypokinesis. The left ventricular internal cavity size was mildly dilated. There is mild concentric left ventricular hypertrophy. Left ventricular diastolic parameters are indeterminate.  2. Right ventricular systolic function is mildly reduced. The right ventricular size is normal. Tricuspid regurgitation signal is inadequate for assessing PA pressure.  3. Left atrial size was moderately dilated.  4. The mitral valve is normal in structure. No evidence of mitral valve regurgitation. No evidence  of mitral stenosis.  5. The aortic valve is tricuspid. There is mild calcification of the aortic valve. Aortic valve regurgitation is not visualized. No aortic stenosis is present. Possible small vegetation on the aortic valve. Recommend TEE for better visualization.  6. The inferior vena cava is dilated in size with <50% respiratory variability, suggesting right atrial pressure of 15 mmHg.  7. The patient was in atrial fibrillation.  8. Technically difficult study with poor acoustic windows. FINDINGS  Left Ventricle: Left ventricular ejection fraction, by estimation, is 50%. The left ventricle has mildly decreased function. The left ventricle demonstrates global hypokinesis. The left ventricular internal cavity size was mildly dilated. There is mild concentric left ventricular hypertrophy. Left ventricular diastolic parameters are indeterminate. Right Ventricle: The right ventricular  size is normal. No increase in right ventricular wall thickness. Right ventricular systolic function is mildly reduced. Tricuspid regurgitation signal is inadequate for assessing PA pressure. Left Atrium: Left atrial size was moderately dilated. Right Atrium: Right atrial size was normal in size. Pericardium: There is no evidence of pericardial effusion. Mitral Valve: The mitral valve is normal in structure. Mild mitral annular calcification. No evidence of mitral valve regurgitation. No evidence of mitral valve stenosis. Tricuspid Valve: The tricuspid valve is normal in structure. Tricuspid valve regurgitation is not demonstrated. Aortic Valve: The aortic valve is tricuspid. There is mild calcification of the aortic valve. Aortic valve regurgitation is not visualized. No aortic stenosis is present. Aortic valve mean gradient measures 5.0 mmHg. Aortic valve peak gradient measures 7.4 mmHg. Pulmonic Valve: The pulmonic valve was not well visualized. Pulmonic valve regurgitation is not visualized. Aorta: The aortic root is normal in size  and structure. Venous: The inferior vena cava is dilated in size with less than 50% respiratory variability, suggesting right atrial pressure of 15 mmHg. IAS/Shunts: No atrial level shunt detected by color flow Doppler.  LEFT VENTRICLE PLAX 2D LVIDd:         5.80 cm Diastology LVIDs:         4.20 cm LV e' medial:    8.49 cm/s LV PW:         1.20 cm LV E/e' medial:  14.8 LV IVS:        1.20 cm LV e' lateral:   12.30 cm/s                        LV E/e' lateral: 10.2  RIGHT VENTRICLE         IVC TAPSE (M-mode): 1.3 cm  IVC diam: 2.30 cm LEFT ATRIUM              Index        RIGHT ATRIUM           Index LA diam:        5.30 cm  2.27 cm/m   RA Area:     13.90 cm LA Vol (A2C):   55.6 ml  23.86 ml/m  RA Volume:   28.70 ml  12.32 ml/m LA Vol (A4C):   108.0 ml 46.35 ml/m LA Biplane Vol: 83.7 ml  35.92 ml/m  AORTIC VALVE AV Vmax:           136.00 cm/s AV Vmean:          107.000 cm/s AV VTI:            0.278 m AV Peak Grad:      7.4 mmHg AV Mean Grad:      5.0 mmHg LVOT Vmax:         111.00 cm/s LVOT Vmean:        74.000 cm/s LVOT VTI:          0.211 m LVOT/AV VTI ratio: 0.76  AORTA Ao Asc diam: 3.20 cm MITRAL VALVE MV Area (PHT): 4.77 cm     SHUNTS MV Decel Time: 159 msec     Systemic VTI: 0.21 m MV E velocity: 126.00 cm/s Dalton McleanMD Electronically signed by Ezra Kanner Signature Date/Time: 03/26/2024/3:29:37 PM    Final    CT Head Wo Contrast Result Date: 03/25/2024 CLINICAL DATA:  Mental status changes with bilateral subdural hematomas, follow-up from yesterday. EXAM: CT HEAD WITHOUT CONTRAST TECHNIQUE: Contiguous axial images were obtained from the base of  the skull through the vertex without intravenous contrast. RADIATION DOSE REDUCTION: This exam was performed according to the departmental dose-optimization program which includes automated exposure control, adjustment of the mA and/or kV according to patient size and/or use of iterative reconstruction technique. COMPARISON:  CT head yesterday at 3:00  p.m., CT head 01/10/2019 FINDINGS: Brain: Bilateral subdural hematomas extend over the cerebral convexities, on the left with maximal thickness 1.1 cm in the posterolateral mid parietal area, on the right with maximum thickness at 1.3 cm lateral mid/upper frontal convexity. There is mild gyral crowding but no overt downward mass effect. Basal cisterns are clear. Secondary mass effect is balanced, with no midline shifting seen. A 1.4 cm focal high posterior left frontal subdural bleed is also again noted, best seen on 5:47. Both subdural collections contain hyperdense portions consistent with recent hemorrhaging. No new intracranial hemorrhage is seen. There is background mild atrophy and small-vessel disease. No cortical based acute infarct is evident or focal mass. No posterior fossa collections. Vascular: Scattered calcification both siphons. No hyperdense central vasculature. Skull: Negative for fractures or focal lesions. Sinuses/Orbits: Moderate patchy opacification of the ethmoid air cells. Mild membrane thickening of the sphenoid and maxillary sinuses. The frontal sinuses, bilateral mastoid air cells, and middle ears are clear. Negative orbits. Other: Bilateral displaced nasal bone fractures. Nondisplaced fracture anterior wall left maxillary sinus and the left lower orbital rim. The age indeterminate but new from 01/10/2024. IMPRESSION: 1. Bilateral subdural hematomas extending over the cerebral convexities, with maximal thickness 1.1 cm on the left and 1.3 cm on the right. Secondary mass effect is balanced, with no midline shifting or overt downward mass effect. Unchanged. 2. 1.4 cm focal high posterior left frontal subdural bleed is also again noted. 3. No new intracranial hemorrhage is seen. 4. Bilateral displaced nasal bone fractures. Nondisplaced fracture anterior wall left maxillary sinus and left lower orbital rim. The age indeterminate but new from 01/10/2024. 5. Sinus disease.  No sinus fluid  levels. Electronically Signed   By: Francis Quam M.D.   On: 03/25/2024 01:42   DG Abd 1 View Result Date: 03/24/2024 CLINICAL DATA:  Nausea and vomiting EXAM: ABDOMEN - 1 VIEW COMPARISON:  01/05/2024 FINDINGS: Scattered large and small bowel gas is noted. Mild retained fecal material is seen without obstructive change. No free air is noted. No abnormal mass or abnormal calcifications are seen. Mild degenerative change of the lumbar spine is noted. IMPRESSION: No acute abnormality noted. Electronically Signed   By: Oneil Devonshire M.D.   On: 03/24/2024 22:35   CT Head Wo Contrast Result Date: 03/24/2024 CLINICAL DATA:  Provided history: Mental status change, unknown cause. EXAM: CT HEAD WITHOUT CONTRAST TECHNIQUE: Contiguous axial images were obtained from the base of the skull through the vertex without intravenous contrast. RADIATION DOSE REDUCTION: This exam was performed according to the departmental dose-optimization program which includes automated exposure control, adjustment of the mA and/or kV according to patient size and/or use of iterative reconstruction technique. COMPARISON:  Head CT 01/10/2024. FINDINGS: Brain: Mild generalized cerebral atrophy. Subdural hematomas along the bilateral cerebral convexities have increased in size since the head CT of 01/10/2024. The subdural hematoma on the right now measures up to 14 mm in thickness (previously 4 mm). The subdural hematoma on the left now measures up to 11 mm in thickness (previously 5 mm). The hematomas are mixed density and there are new hyperdense components (consistent with acute hemorrhage) within both collections. Balanced mass effect upon the underlying cerebral hemispheres.  No midline shift or significant ventricular effacement. Additional 14 mm ovoid focus of acute hemorrhage overlying the high posterior left frontal lobe, which is also favored subdural in location (for instance as seen on series 4, image 43). No demarcated cortical  infarct. No evidence of an intracranial mass. Vascular: No hyperdense vessel.  Atherosclerotic calcifications. Skull: No calvarial fracture or aggressive osseous lesion. Sinuses/Orbits: No mass or acute finding within the imaged orbits. Displaced fractures of the bilateral nasal bones, anterior wall of the left maxillary sinus and left inferior orbital rim, new from the prior head CT of 01/10/2024. These results were called by telephone at the time of interpretation on 03/24/2024 at 3:26 pm to provider [facility] , who verbally acknowledged these results. IMPRESSION: 1. Interval increase in size of subdural hematomas overlying the bilateral cerebral hemispheres (now measuring up to 14 mm in thickness on the right and 11 mm in thickness on the left). The subdural hematomas are mixed density and there are new hyperdense components (consistent with acute hemorrhage) within both collections. Balanced mass effect upon the underlying cerebral hemispheres. No midline shift. 2. Additional 14 mm ovoid focus of acute hemorrhage overlying the high posterior left frontal lobe, which is also favored subdural in location 3. Displaced fractures of the bilateral nasal bones, anterior wall of the left maxillary sinus and left inferior orbital rim, new from the prior head CT of 01/10/2024. 4. Mild generalized cerebral atrophy. Electronically Signed   By: Rockey Childs D.O.   On: 03/24/2024 15:28   DG Chest Port 1 View Result Date: 03/24/2024 CLINICAL DATA:  Questionable sepsis - evaluate for abnormality EXAM: PORTABLE CHEST 1 VIEW COMPARISON:  01/05/2024. FINDINGS: Stable cardiomediastinal contours. Similar patchy scarring in the right upper lobe. No focal consolidation, pleural effusion, or pneumothorax. No acute osseous abnormality. IMPRESSION: No acute cardiopulmonary findings. Electronically Signed   By: Harrietta Sherry M.D.   On: 03/24/2024 14:59    Microbiology: Recent Results (from the past 240 hours)  Blood Culture  (routine x 2)     Status: Abnormal   Collection Time: 03/24/24  1:42 PM   Specimen: BLOOD  Result Value Ref Range Status   Specimen Description BLOOD LEFT ANTECUBITAL  Final   Special Requests   Final    BOTTLES DRAWN AEROBIC AND ANAEROBIC Blood Culture adequate volume   Culture  Setup Time   Final    GRAM POSITIVE COCCI IN CLUSTERS AEROBIC BOTTLE ONLY CRITICAL RESULT CALLED TO, READ BACK BY AND VERIFIED WITH: MAYA RENELLA NOVAK [id number] FCP Performed at [facility], [address] [place], [place] [address]    Culture METHICILLIN RESISTANT STAPHYLOCOCCUS AUREUS (A)  Final   Report Status 03/27/2024 FINAL  Final   Organism ID, Bacteria METHICILLIN RESISTANT STAPHYLOCOCCUS AUREUS  Final      Susceptibility   Methicillin resistant staphylococcus aureus - MIC*    CIPROFLOXACIN  >=8 RESISTANT Resistant     ERYTHROMYCIN >=8 RESISTANT Resistant     GENTAMICIN <=0.5 SENSITIVE Sensitive     OXACILLIN >=4 RESISTANT Resistant     TETRACYCLINE >=16 RESISTANT Resistant     VANCOMYCIN  <=0.5 SENSITIVE Sensitive     TRIMETH /SULFA  >=320 RESISTANT Resistant     CLINDAMYCIN  <=0.25 SENSITIVE Sensitive     RIFAMPIN <=0.5 SENSITIVE Sensitive     Inducible Clindamycin  NEGATIVE Sensitive     LINEZOLID 2 SENSITIVE Sensitive     * METHICILLIN RESISTANT STAPHYLOCOCCUS AUREUS  Blood Culture ID Panel (Reflexed)     Status: Abnormal  Collection Time: 03/24/24  1:42 PM  Result Value Ref Range Status   Enterococcus faecalis NOT DETECTED NOT DETECTED Final   Enterococcus Faecium NOT DETECTED NOT DETECTED Final   Listeria monocytogenes NOT DETECTED NOT DETECTED Final   Staphylococcus species DETECTED (A) NOT DETECTED Final    Comment: CRITICAL RESULT CALLED TO, READ BACK BY AND VERIFIED WITH: PHARMD CARLEN B [id number] FCP    Staphylococcus aureus (BCID) DETECTED (A) NOT DETECTED Final    Comment: Methicillin (oxacillin)-resistant Staphylococcus aureus (MRSA). MRSA is predictably resistant to  beta-lactam antibiotics (except ceftaroline). Preferred therapy is vancomycin  unless clinically contraindicated. Patient requires contact precautions if  hospitalized. CRITICAL RESULT CALLED TO, READ BACK BY AND VERIFIED WITH: PHARMD CARLEN B [id number] FCP    Staphylococcus epidermidis NOT DETECTED NOT DETECTED Final   Staphylococcus lugdunensis NOT DETECTED NOT DETECTED Final   Streptococcus species NOT DETECTED NOT DETECTED Final   Streptococcus agalactiae NOT DETECTED NOT DETECTED Final   Streptococcus pneumoniae NOT DETECTED NOT DETECTED Final   Streptococcus pyogenes NOT DETECTED NOT DETECTED Final   A.calcoaceticus-baumannii NOT DETECTED NOT DETECTED Final   Bacteroides fragilis NOT DETECTED NOT DETECTED Final   Enterobacterales NOT DETECTED NOT DETECTED Final   Enterobacter cloacae complex NOT DETECTED NOT DETECTED Final   Escherichia coli NOT DETECTED NOT DETECTED Final   Klebsiella aerogenes NOT DETECTED NOT DETECTED Final   Klebsiella oxytoca NOT DETECTED NOT DETECTED Final   Klebsiella pneumoniae NOT DETECTED NOT DETECTED Final   Proteus species NOT DETECTED NOT DETECTED Final   Salmonella species NOT DETECTED NOT DETECTED Final   Serratia marcescens NOT DETECTED NOT DETECTED Final   Haemophilus influenzae NOT DETECTED NOT DETECTED Final   Neisseria meningitidis NOT DETECTED NOT DETECTED Final   Pseudomonas aeruginosa NOT DETECTED NOT DETECTED Final   Stenotrophomonas maltophilia NOT DETECTED NOT DETECTED Final   Candida albicans NOT DETECTED NOT DETECTED Final   Candida auris NOT DETECTED NOT DETECTED Final   Candida glabrata NOT DETECTED NOT DETECTED Final   Candida krusei NOT DETECTED NOT DETECTED Final   Candida parapsilosis NOT DETECTED NOT DETECTED Final   Candida tropicalis NOT DETECTED NOT DETECTED Final   Cryptococcus neoformans/gattii NOT DETECTED NOT DETECTED Final   Meth resistant mecA/C and MREJ DETECTED (A) NOT DETECTED Final    Comment: CRITICAL RESULT  CALLED TO, READ BACK BY AND VERIFIED WITH: MAYA HARP B [id number] FCP Performed at [facility], [address], [place], [place] [address]   Resp panel by RT-PCR (RSV, Flu A&B, Covid) Anterior Nasal Swab     Status: None   Collection Time: 03/24/24  1:43 PM   Specimen: Anterior Nasal Swab  Result Value Ref Range Status   SARS Coronavirus 2 by RT PCR NEGATIVE NEGATIVE Final   Influenza A by PCR NEGATIVE NEGATIVE Final   Influenza B by PCR NEGATIVE NEGATIVE Final    Comment: (NOTE) The Xpert Xpress SARS-CoV-2/FLU/RSV plus assay is intended as an aid in the diagnosis of influenza from Nasopharyngeal swab specimens and should not be used as a sole basis for treatment. Nasal washings and aspirates are unacceptable for Xpert Xpress SARS-CoV-2/FLU/RSV testing.  Fact Sheet for Patients: BloggerCourse.com  Fact Sheet for Healthcare Providers: SeriousBroker.it  This test is not yet approved or cleared by the [place]  FDA and has been authorized for detection and/or diagnosis of SARS-CoV-2 by FDA under an Emergency Use Authorization (EUA). This EUA will remain in effect (meaning this test can be used) for the  duration of the COVID-19 declaration under Section 564(b)(1) of the Act, 21 U.S.C. section 360bbb-3(b)(1), unless the authorization is terminated or revoked.     Resp Syncytial Virus by PCR NEGATIVE NEGATIVE Final    Comment: (NOTE) Fact Sheet for Patients: BloggerCourse.com  Fact Sheet for Healthcare Providers: SeriousBroker.it  This test is not yet approved or cleared by the [place]  FDA and has been authorized for detection and/or diagnosis of SARS-CoV-2 by FDA under an Emergency Use Authorization (EUA). This EUA will remain in effect (meaning this test can be used) for the duration of the COVID-19 declaration under Section 564(b)(1) of the Act, 21  U.S.C. section 360bbb-3(b)(1), unless the authorization is terminated or revoked.  Performed at [facility], [address] [place], [place] [address]   MRSA Next Gen by PCR, Nasal     Status: None   Collection Time: 03/24/24  5:06 PM   Specimen: Nasal Mucosa; Nasal Swab  Result Value Ref Range Status   MRSA by PCR Next Gen NOT DETECTED NOT DETECTED Final    Comment: (NOTE) The GeneXpert MRSA Assay (FDA approved for NASAL specimens only), is one component of a comprehensive MRSA colonization surveillance program. It is not intended to diagnose MRSA infection nor to guide or monitor treatment for MRSA infections. Test performance is not FDA approved in patients less than 73 years old. Performed at [facility], [address] [place], [place] [address]   Blood Culture (routine x 2)     Status: None (Preliminary result)   Collection Time: 03/24/24  6:55 PM   Specimen: BLOOD  Result Value Ref Range Status   Specimen Description BLOOD BLOOD RIGHT HAND  Final   Special Requests   Final    BOTTLES DRAWN AEROBIC AND ANAEROBIC Blood Culture adequate volume   Culture   Final    NO GROWTH 3 DAYS Performed at [facility], [address] [place], [place] [address]    Report Status PENDING  Incomplete  Culture, blood (Routine X 2) w Reflex to ID Panel     Status: None (Preliminary result)   Collection Time: 03/26/24  8:05 AM   Specimen: BLOOD RIGHT ARM  Result Value Ref Range Status   Specimen Description BLOOD RIGHT ARM  Final   Special Requests   Final    BOTTLES DRAWN AEROBIC ONLY Blood Culture results may not be optimal due to an inadequate volume of blood received in culture bottles   Culture   Final    NO GROWTH 1 DAY Performed at [facility], [address], [place], [place] [address]    Report Status PENDING  Incomplete  Culture, blood (Routine X 2) w Reflex to ID Panel     Status: None (Preliminary result)   Collection Time: 03/26/24  8:05 AM    Specimen: BLOOD RIGHT HAND  Result Value Ref Range Status   Specimen Description BLOOD RIGHT HAND  Final   Special Requests   Final    BOTTLES DRAWN AEROBIC ONLY Blood Culture adequate volume   Culture   Final    NO GROWTH 1 DAY Performed at [facility], [address], [place], [place] [address]    Report Status PENDING  Incomplete     Labs: Basic Metabolic Panel: Recent Labs  Lab 03/24/24 1531 03/25/24 0720 03/25/24 2017 03/26/24 1012 03/27/24 1048  NA 135 131*  --  131* 134*  K 3.3* 3.3*  --  3.8 3.7  CL 101 103  --  101 107  CO2 22 21*  --  19* 18*  GLUCOSE 198* 132*  --  191* 148*  BUN 12 15  --  15 14  CREATININE 0.97 0.85  --  0.67 0.66  CALCIUM  8.8* 8.2*  --  8.1* 8.5*  MG  --  1.3* 2.1 1.7  --    Liver Function Tests: Recent Labs  Lab 03/24/24 1531 03/25/24 0720 03/26/24 1012 03/27/24 1048  AST 32 26 31 39  ALT 16 14 14 20   ALKPHOS 86 70 74 110  BILITOT 2.9* 2.1* 1.5* 1.4*  PROT 6.6 5.7* 5.4* 5.6*  ALBUMIN 3.2* 2.6* 2.4* 2.5*   No results for input(s): LIPASE, AMYLASE in the last 168 hours. Recent Labs  Lab 03/24/24 1533  AMMONIA 31   CBC: Recent Labs  Lab 03/24/24 1531 03/25/24 0720 03/26/24 1012 03/27/24 1048  WBC 17.5* 9.5 5.6 5.4  NEUTROABS 15.8*  --   --  3.6  HGB 12.4* 11.2* 10.8* 11.1*  HCT 36.2* 31.9* 32.1* 31.9*  MCV 97.3 96.7 97.9 95.5  PLT 78* 61* 64* 78*   Cardiac Enzymes: Recent Labs  Lab 03/26/24 1012  CKTOTAL 55   BNP: BNP (last 3 results) Recent Labs    06/15/23 1632  BNP 21.0    ProBNP (last 3 results) No results for input(s): PROBNP in the last 8760 hours.  CBG: Recent Labs  Lab 03/27/24 0600 03/27/24 1151 03/27/24 1714 03/27/24 2125 03/28/24 0611  GLUCAP 110* 107* 163* 143* 182*    Signed:  Colen Grimes MD   [facility] 03/28/2024, 8:32 AM

## 2024-03-28 NOTE — Progress Notes (Signed)
 OT Cancellation Note  Patient Details Name: THADDEAUS MONICA MRN: [id number] DOB: June 05, 1961   Cancelled Treatment:    Reason Eval/Treat Not Completed: Patient declined, no reason specified (Patient up in wheelchair and declined OT stating he was trying to leave the hospital. OT to reattempt later today as schedule permits.)  Jeb LITTIE Laine 03/28/2024, 7:14 AM  Dick Laine, OTA Acute Rehabilitation Services  Office [phone]

## 2024-03-28 NOTE — Progress Notes (Signed)
 Overnight floor coverage  Informed by RN that patient is repeatedly expressing his desire to leave the hospital AGAINST MEDICAL ADVICE.  I have spoken to the patient and explained to him that this is not medically advisable at this time and can result in medical complications such as Disability and even Death. Patient is currently AAO x 4 and has full decision-making capacity.  He understands and accepts the risks involved.  After I had a conversation with the patient and tried my very best to convince him to stay in the hospital for further treatment, patient now agrees to stay until daytime.

## 2024-03-28 NOTE — Progress Notes (Signed)
 Pt continues to want to leave AMA even after speaking with MD at this point I have told pt to let me know when he wants to sign AMA no longer trying to convince pt to stay he has done this every 2 to 3 hours. Now stating if MD does not see him before 9am he is leaving. 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

## 2024-03-29 LAB — CULTURE, BLOOD (ROUTINE X 2)
Culture: NO GROWTH
Special Requests: ADEQUATE

## 2024-03-31 LAB — CULTURE, BLOOD (ROUTINE X 2)
Culture: NO GROWTH
Culture: NO GROWTH
Special Requests: ADEQUATE

## 2024-04-15 ENCOUNTER — Telehealth: Payer: Self-pay | Admitting: Orthopedic Surgery

## 2024-04-15 NOTE — Telephone Encounter (Signed)
 Patient called and said he has his legs but don't know how to use them. He stated that hanger told him to come see Harden for PT. CB#[phone]

## 2024-04-16 ENCOUNTER — Other Ambulatory Visit: Payer: Self-pay

## 2024-04-16 DIAGNOSIS — Z89511 Acquired absence of right leg below knee: Secondary | ICD-10-CM

## 2024-04-16 NOTE — Telephone Encounter (Signed)
Order in chart for prosthetic gait training

## 2024-04-19 ENCOUNTER — Telehealth: Payer: Self-pay | Admitting: Orthopedic Surgery

## 2024-04-19 NOTE — Telephone Encounter (Signed)
 [name] from [employer] called and the patient said wants a referral for PT. CB# [phone]

## 2024-04-19 NOTE — Telephone Encounter (Signed)
 Thoe order for PT was made on the day the pt called. I called [name] back and lm on vm to advise  rtn call for [name] request complete call with questions This pt is homeless and does not have access to a phone. I left as little details as possible as quoted above. Pt to call with any questions. The pt was referred to the [facility] outpatient neuro rehab. The pt will need to call and make an appt [phone]

## 2024-04-23 ENCOUNTER — Ambulatory Visit: Attending: Orthopedic Surgery | Admitting: Physical Therapy

## 2024-04-23 NOTE — Therapy (Incomplete)
 OUTPATIENT PHYSICAL THERAPY NEURO EVALUATION   Patient Name: Cory Palmer MRN: [id number] DOB:1961/02/24, 63 y.o., male Today's Date: 04/23/2024   PCP: Delores Rojelio Caldron, NP REFERRING PROVIDER: Harden Jerona GAILS, MD  END OF SESSION:   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   Past Surgical History:  Procedure Laterality Date   AMPUTATION Left 09/28/2018   Procedure: LEFT TRANSMETATARSAL AMPUTATION;  Surgeon: Harden Jerona GAILS, MD;  Location: [facility] OR;  Service: Orthopedics;  Laterality: Left;   AMPUTATION Left 10/23/2019   Procedure: AMPUTATION BELOW KNEE;  Surgeon: Harden Jerona GAILS, MD;  Location: [facility] OR;  Service: Orthopedics;  Laterality: Left;   BELOW KNEE LEG AMPUTATION Right    ESOPHAGOGASTRODUODENOSCOPY (EGD) WITH PROPOFOL  N/A 10/19/2020   Procedure: ESOPHAGOGASTRODUODENOSCOPY (EGD) WITH PROPOFOL ;  Surgeon: Kristie Lamprey, MD;  Location: [facility] ENDOSCOPY;  Service: Endoscopy;  Laterality: N/A;   TEE WITHOUT CARDIOVERSION N/A 10/25/2019   Procedure: TRANSESOPHAGEAL ECHOCARDIOGRAM (TEE);  Surgeon: Pietro Redell RAMAN, MD;  Location: [facility] ENDOSCOPY;  Service: Cardiovascular;  Laterality: N/A;   Patient Active Problem List   Diagnosis Date Noted   Traumatic subdural hematoma (SDH) (HCC) 03/24/2024   GAD (generalized anxiety disorder) 06/08/2023   Acute pulmonary embolism (HCC) 12/13/2022   Tobacco use 12/13/2022   Substance induced mood disorder (HCC) 11/08/2022   Pneumonia 08/26/2022   Suicide ideation 06/26/2022   Alcohol abuse 04/10/2022   Cocaine abuse with cocaine-induced mood disorder (HCC) 04/10/2022   Adjustment disorder with mixed anxiety and depressed  mood 04/10/2022   Alcohol-induced mood disorder (HCC) 04/10/2022   Severe sepsis (HCC) 03/24/2022   Abdominal pain 03/24/2022   Nausea vomiting and diarrhea 03/24/2022   Prolonged QT interval 03/24/2022   Macrocytosis without anemia 03/24/2022   Sepsis (HCC) 03/24/2022   Acute hepatic encephalopathy (HCC) 11/17/2021   PVD (peripheral vascular disease) 11/17/2021   Metabolic acidosis 11/17/2021   Esophagitis 11/17/2021   Acute metabolic encephalopathy 11/11/2021   Thrombocytopenia 11/11/2021   Leukocytosis 10/10/2020   GERD (gastroesophageal reflux disease) 10/10/2020   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HCC) 05/06/2020   Hyponatremia 10/23/2019   MRSA bacteremia 10/21/2019   Atrial flutter with rapid ventricular response (HCC) 10/21/2019   Lactic acidosis 10/21/2019   Severe protein-calorie malnutrition    Diabetic polyneuropathy associated with type 2 diabetes mellitus (HCC)    Cirrhosis of liver (HCC) 10/19/2019   Anemia 10/19/2019   Medication monitoring encounter 08/15/2019   Serum total bilirubin elevated 10/29/2018   BPH (benign prostatic hyperplasia) 10/29/2018   Below-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)    PAF (paroxysmal atrial fibrillation) (HCC)    Hypokalemia    Hypomagnesemia    Sepsis without acute organ dysfunction (HCC) 10/05/2018   Depressed bipolar disorder (HCC) 10/05/2018   Hypertension 10/05/2018   Hyperbilirubinemia 10/05/2018   Homelessness 05/07/2018   Diabetes mellitus type II, non insulin  dependent (HCC) 05/07/2018    ONSET DATE: 04/16/2024 (referral)   REFERRING DIAG: S10.487,S10.488 (ICD-10-CM) - S/P bilateral below knee amputation (HCC)  THERAPY DIAG:  No diagnosis found.  Rationale for Evaluation and Treatment: Rehabilitation  SUBJECTIVE:  SUBJECTIVE STATEMENT: *** Pt accompanied by: {accompnied:27141}  PERTINENT HISTORY: B BKA, a-fib, alcohol abuse, homelessness, DMT2, PE/PAF, polysubstance abuse  PAIN:  Are you having pain? {OPRCPAIN:27236}  PRECAUTIONS: {Therapy precautions:24002}  RED FLAGS: {PT Red Flags:29287}   WEIGHT BEARING RESTRICTIONS: {Yes ***/No:24003}  FALLS: Has patient fallen in last 6 months? {fallsyesno:27318}  LIVING ENVIRONMENT: Lives with: {OPRC lives with:25569::lives with their family} Lives in: {Lives in:25570} Stairs: {opstairs:27293} Has following equipment at home: {Assistive devices:23999}  PLOF: {PLOF:24004}  PATIENT GOALS: ***  OBJECTIVE:  Note: Objective measures were completed at Evaluation unless otherwise noted.  DIAGNOSTIC FINDINGS: ***  COGNITION: Overall cognitive status: {cognition:24006}   SENSATION: {sensation:27233}  COORDINATION: ***  EDEMA:  {edema:24020}  MUSCLE TONE: {LE tone:25568}  MUSCLE LENGTH: Hamstrings: Right *** deg; Left *** deg Debby test: Right *** deg; Left *** deg  DTRs:  {DTR SITE:24025}  POSTURE: {posture:25561}  LOWER EXTREMITY ROM:     {AROM/PROM:27142}  Right Eval Left Eval  Hip flexion    Hip extension    Hip abduction    Hip adduction    Hip internal rotation    Hip external rotation    Knee flexion    Knee extension    Ankle dorsiflexion    Ankle plantarflexion    Ankle inversion    Ankle eversion     (Blank rows = not tested)  LOWER EXTREMITY MMT:    MMT Right Eval Left Eval  Hip flexion    Hip extension    Hip abduction    Hip adduction    Hip internal rotation    Hip external rotation    Knee flexion    Knee extension    Ankle dorsiflexion    Ankle plantarflexion    Ankle inversion    Ankle eversion    (Blank rows = not tested)  BED MOBILITY:  {bed mobility:32615:p}  TRANSFERS: {transfers eval:32620}  RAMP:  {ramp eval:32616}  CURB:  {curb  eval:32617}  STAIRS: {stairs eval:32618} GAIT: Findings: {GaitneuroPT:32644::Distance walked: ***,Comments: ***}  FUNCTIONAL TESTS:  {Functional tests:24029}  PATIENT SURVEYS:  {rehab surveys:24030}                                                                                                                              TREATMENT DATE: ***    PATIENT EDUCATION: Education details: *** Person educated: {Person educated:25204} Education method: {Education Method:25205} Education comprehension: {Education Comprehension:25206}  HOME EXERCISE PROGRAM: ***  GOALS: Goals reviewed with patient? Yes  SHORT TERM GOALS: Target date: ***  *** Baseline: Goal status: INITIAL  2.  *** Baseline:  Goal status: INITIAL  3.  *** Baseline:  Goal status: INITIAL  4.  *** Baseline:  Goal status: INITIAL  5.  *** Baseline:  Goal status: INITIAL  6.  *** Baseline:  Goal status: INITIAL  LONG TERM GOALS: Target date: ***  *** Baseline:  Goal status: INITIAL  2.  *** Baseline:  Goal status: INITIAL  3.  ***  Baseline:  Goal status: INITIAL  4.  *** Baseline:  Goal status: INITIAL  5.  *** Baseline:  Goal status: INITIAL  6.  *** Baseline:  Goal status: INITIAL  ASSESSMENT:  CLINICAL IMPRESSION: Patient is a 63 year old male referred to Neuro OPPT for bilateral BKA. Pt's PMH is significant for: B BKA, a-fib, alcohol abuse, homelessness, DMT2, PE/PAF, polysubstance abuse. The following deficits were present during the exam: ***. Based on ***, pt is an incr risk for falls. Pt would benefit from skilled PT to address these impairments and functional limitations to maximize functional mobility independence   OBJECTIVE IMPAIRMENTS: {opptimpairments:25111}.   ACTIVITY LIMITATIONS: {activitylimitations:27494}  PARTICIPATION LIMITATIONS: {participationrestrictions:25113}  PERSONAL FACTORS: {Personal factors:25162} are also affecting patient's functional  outcome.   REHAB POTENTIAL: {rehabpotential:25112}  CLINICAL DECISION MAKING: {clinical decision making:25114}  EVALUATION COMPLEXITY: {Evaluation complexity:25115}  PLAN:  PT FREQUENCY: {rehab frequency:25116}  PT DURATION: {rehab duration:25117}  PLANNED INTERVENTIONS: {rehab planned interventions:25118::97110-Therapeutic exercises,97530- Therapeutic [phone]- Neuromuscular re-education,97535- Self Rjmz,02859- Manual therapy,Patient/Family education}  PLAN FOR NEXT SESSION: ***   Kaizlee Carlino E Rashad Auld, PT, DPT 04/23/2024, 7:45 AM

## 2024-04-25 ENCOUNTER — Ambulatory Visit (INDEPENDENT_AMBULATORY_CARE_PROVIDER_SITE_OTHER): Admitting: Physician Assistant

## 2024-04-25 DIAGNOSIS — S8002XA Contusion of left knee, initial encounter: Secondary | ICD-10-CM | POA: Diagnosis not present

## 2024-04-26 ENCOUNTER — Encounter: Payer: Self-pay | Admitting: Physician Assistant

## 2024-04-26 NOTE — Progress Notes (Signed)
 Office Visit Note   Patient: Cory Palmer           Date of Birth: 1961/03/30           MRN: [id number] Visit Date: 04/25/2024              Requested by: Delores Rojelio Caldron, NP [address] [place],  [place] [address] PCP: Delores Rojelio Caldron, NP  Chief Complaint  Patient presents with   Left Leg - Pain      HPI: 63 y/o male with history of B BKA and homeless reports to our office for exam of the left knee.  He states he fell out of his WC and hit his left knee.  He denies fever and chills.  No open wound or drainage.    Assessment & Plan: Visit Diagnoses: No diagnosis found.  Plan: Left anterior tibial scab was cleaned with Vashe and cover with a band aide.  He has 2 new BKA prothesis and is waiting on PT that is scheduled for gait training.    Follow-Up Instructions: Return if symptoms worsen or fail to improve.   Ortho Exam  Patient is alert, oriented, no adenopathy, well-dressed, normal affect, normal respiratory effort. Left anterior knee with scab over bony prominence of the tibial tuberosity.  No fluctuance, no ischemic skin changes or open wound.  No cellulitis.  Well healed warm left BKA stump.      Imaging: No results found.   Labs: Lab Results  Component Value Date   HGBA1C 5.7 (H) 03/24/2024   HGBA1C 6.6 12/28/2022   HGBA1C 6.5 (H) 08/26/2022   ESRSEDRATE 10 03/24/2022   ESRSEDRATE 34 (H) 08/15/2019   ESRSEDRATE 31 (H) 08/05/2019   CRP 5.8 (H) 03/24/2024   CRP 6.5 (H) 03/24/2022   CRP 15.0 (H) 08/15/2019   REPTSTATUS 03/31/2024 FINAL 03/26/2024   REPTSTATUS 03/31/2024 FINAL 03/26/2024   GRAMSTAIN  11/19/2019    RARE WBC PRESENT, PREDOMINANTLY PMN RARE GRAM POSITIVE COCCI    CULT  03/26/2024    NO GROWTH 5 DAYS Performed at [facility] Lab, [address] [place], [place] [address]    CULT  03/26/2024    NO GROWTH 5 DAYS Performed at [facility], [address], [place], [place] [address]    LABORGA METHICILLIN RESISTANT  STAPHYLOCOCCUS AUREUS 03/24/2024     Lab Results  Component Value Date   ALBUMIN 2.5 (L) 03/27/2024   ALBUMIN 2.4 (L) 03/26/2024   ALBUMIN 2.6 (L) 03/25/2024   PREALBUMIN 11.2 (L) 05/08/2018    Lab Results  Component Value Date   MG 1.7 03/26/2024   MG 2.1 03/25/2024   MG 1.3 (L) 03/25/2024   No results found for: [facility]  Lab Results  Component Value Date   PREALBUMIN 11.2 (L) 05/08/2018      Latest Ref Rng & Units 03/27/2024   10:48 AM 03/26/2024   10:12 AM 03/25/2024    7:20 AM  CBC EXTENDED  WBC 4.0 - 10.5 K/uL 5.4  5.6  9.5   RBC 4.22 - 5.81 MIL/uL 3.34  3.28  3.30   Hemoglobin 13.0 - 17.0 g/dL 88.8  89.1  88.7   HCT 39.0 - 52.0 % 31.9  32.1  31.9   Platelets 150 - 400 K/uL 78  64  61   NEUT# 1.7 - 7.7 K/uL 3.6     Lymph# 0.7 - 4.0 K/uL 0.8        There is  no height or weight on file to calculate BMI.  Orders:  No orders of the defined types were placed in this encounter.  No orders of the defined types were placed in this encounter.    Procedures: No procedures performed  Clinical Data: No additional findings.  ROS:  All other systems negative, except as noted in the HPI. Review of Systems  Objective: Vital Signs: There were no vitals taken for this visit.  Specialty Comments:  No specialty comments available.  PMFS History: Patient Active Problem List   Diagnosis Date Noted   Traumatic subdural hematoma (SDH) (HCC) 03/24/2024   GAD (generalized anxiety disorder) 06/08/2023   Acute pulmonary embolism (HCC) 12/13/2022   Tobacco use 12/13/2022   Substance induced mood disorder (HCC) 11/08/2022   Pneumonia 08/26/2022   Suicide ideation 06/26/2022   Alcohol abuse 04/10/2022   Cocaine abuse with cocaine-induced mood disorder (HCC) 04/10/2022   Adjustment disorder with mixed anxiety and depressed mood 04/10/2022   Alcohol-induced mood disorder (HCC) 04/10/2022   Severe sepsis (HCC) 03/24/2022   Abdominal pain 03/24/2022   Nausea vomiting  and diarrhea 03/24/2022   Prolonged QT interval 03/24/2022   Macrocytosis without anemia 03/24/2022   Sepsis (HCC) 03/24/2022   Acute hepatic encephalopathy (HCC) 11/17/2021   PVD (peripheral vascular disease) 11/17/2021   Metabolic acidosis 11/17/2021   Esophagitis 11/17/2021   Acute metabolic encephalopathy 11/11/2021   Thrombocytopenia 11/11/2021   Leukocytosis 10/10/2020   GERD (gastroesophageal reflux disease) 10/10/2020   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HCC) 05/06/2020   Hyponatremia 10/23/2019   MRSA bacteremia 10/21/2019   Atrial flutter with rapid ventricular response (HCC) 10/21/2019   Lactic acidosis 10/21/2019   Severe protein-calorie malnutrition    Diabetic polyneuropathy associated with type 2 diabetes mellitus (HCC)    Cirrhosis of liver (HCC) 10/19/2019   Anemia 10/19/2019   Medication monitoring encounter 08/15/2019   Serum total bilirubin elevated 10/29/2018   BPH (benign prostatic hyperplasia) 10/29/2018   Below-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)    PAF (paroxysmal atrial fibrillation) (HCC)    Hypokalemia    Hypomagnesemia    Sepsis without acute organ dysfunction (HCC) 10/05/2018   Depressed bipolar disorder (HCC) 10/05/2018   Hypertension 10/05/2018   Hyperbilirubinemia 10/05/2018   Homelessness 05/07/2018   Diabetes mellitus type II, non insulin  dependent (HCC) 05/07/2018   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    Family History  Problem Relation Age of Onset   Hypertension Other    Diabetes Mellitus II Other     Past Surgical History:  Procedure Laterality Date   AMPUTATION Left  09/28/2018   Procedure: LEFT TRANSMETATARSAL AMPUTATION;  Surgeon: Harden Jerona GAILS, MD;  Location: [facility] OR;  Service: Orthopedics;  Laterality: Left;   AMPUTATION Left 10/23/2019   Procedure: AMPUTATION BELOW KNEE;  Surgeon: Harden Jerona GAILS, MD;  Location: [facility] OR;  Service: Orthopedics;  Laterality: Left;   BELOW KNEE LEG AMPUTATION Right    ESOPHAGOGASTRODUODENOSCOPY (EGD) WITH PROPOFOL  N/A 10/19/2020   Procedure: ESOPHAGOGASTRODUODENOSCOPY (EGD) WITH PROPOFOL ;  Surgeon: Kristie Lamprey, MD;  Location: [facility] ENDOSCOPY;  Service: Endoscopy;  Laterality: N/A;  TEE WITHOUT CARDIOVERSION N/A 10/25/2019   Procedure: TRANSESOPHAGEAL ECHOCARDIOGRAM (TEE);  Surgeon: Pietro Redell RAMAN, MD;  Location: [facility] ENDOSCOPY;  Service: Cardiovascular;  Laterality: N/A;   Social History   Occupational History   Not on file  Tobacco Use   Smoking status: Every Day    Current packs/day: 0.25    Types: Cigarettes    Passive exposure: Current   Smokeless tobacco: Never  Vaping Use   Vaping status: Never Used  Substance and Sexual Activity   Alcohol use: Yes   Drug use: Not Currently    Types: Cocaine    Comment: Last used in July 2023   Sexual activity: Not Currently

## 2024-05-01 ENCOUNTER — Ambulatory Visit

## 2024-05-02 ENCOUNTER — Ambulatory Visit: Attending: Orthopedic Surgery | Admitting: Physical Therapy

## 2024-05-02 ENCOUNTER — Encounter: Payer: Self-pay | Admitting: Physical Therapy

## 2024-05-02 VITALS — BP 112/66 | HR 82

## 2024-05-02 DIAGNOSIS — R2689 Other abnormalities of gait and mobility: Secondary | ICD-10-CM | POA: Insufficient documentation

## 2024-05-02 DIAGNOSIS — Z89512 Acquired absence of left leg below knee: Secondary | ICD-10-CM | POA: Diagnosis not present

## 2024-05-02 DIAGNOSIS — M6281 Muscle weakness (generalized): Secondary | ICD-10-CM | POA: Insufficient documentation

## 2024-05-02 DIAGNOSIS — R296 Repeated falls: Secondary | ICD-10-CM | POA: Diagnosis present

## 2024-05-02 DIAGNOSIS — G546 Phantom limb syndrome with pain: Secondary | ICD-10-CM | POA: Insufficient documentation

## 2024-05-02 DIAGNOSIS — R2681 Unsteadiness on feet: Secondary | ICD-10-CM | POA: Diagnosis present

## 2024-05-02 DIAGNOSIS — Z89511 Acquired absence of right leg below knee: Secondary | ICD-10-CM | POA: Insufficient documentation

## 2024-05-02 NOTE — Therapy (Signed)
 OUTPATIENT PHYSICAL THERAPY PROSTHETICS EVALUATION   Patient Name: Cory Palmer MRN: [id number] DOB:[birth year], 63 y.o., male Today's Date: 05/02/2024   PCP: Delores Rojelio Caldron, NP REFERRING PROVIDER: Harden Jerona GAILS, MD  END OF SESSION:  PT End of Session - 05/02/24 1234     Visit Number 1    Number of Visits 13    Date for Recertification  06/20/24    Authorization Type [employer] Dual Complete    PT Start Time 1232    PT Stop Time 1318    PT Time Calculation (min) 46 min    Equipment Utilized During Treatment Gait belt;Other (comment)   Bilateral BKA prosthetics   Activity Tolerance Patient tolerated treatment well    Behavior During Therapy WFL for tasks assessed/performed          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 OR;  Service: Orthopedics;  Laterality: Left;   BELOW KNEE LEG AMPUTATION Right    ESOPHAGOGASTRODUODENOSCOPY (EGD) WITH PROPOFOL  N/A 10/19/2020   Procedure: ESOPHAGOGASTRODUODENOSCOPY (EGD) WITH PROPOFOL ;  Surgeon: Kristie Lamprey, MD;  Location: [facility] ENDOSCOPY;  Service: Endoscopy;  Laterality: N/A;   TEE WITHOUT CARDIOVERSION N/A 10/25/2019   Procedure: TRANSESOPHAGEAL ECHOCARDIOGRAM (TEE);  Surgeon: Pietro Redell RAMAN, MD;  Location: [facility] ENDOSCOPY;  Service: Cardiovascular;  Laterality: N/A;   Patient Active  Problem List   Diagnosis Date Noted   Traumatic subdural hematoma (SDH) (HCC) 03/24/2024   GAD (generalized anxiety disorder) 06/08/2023   Acute pulmonary embolism (HCC) 12/13/2022   Tobacco use 12/13/2022   Substance induced mood disorder (HCC) 11/08/2022   Pneumonia 08/26/2022   Suicide ideation 06/26/2022   Alcohol abuse 04/10/2022   Cocaine abuse with cocaine-induced mood disorder (HCC) 04/10/2022   Adjustment disorder with mixed anxiety and depressed mood 04/10/2022   Alcohol-induced mood disorder (HCC) 04/10/2022   Severe sepsis (HCC) 03/24/2022   Abdominal pain 03/24/2022   Nausea vomiting and diarrhea 03/24/2022   Prolonged QT interval 03/24/2022   Macrocytosis without anemia 03/24/2022   Sepsis (HCC) 03/24/2022   Acute hepatic encephalopathy (HCC) 11/17/2021   PVD (peripheral vascular disease) 11/17/2021   Metabolic acidosis 11/17/2021   Esophagitis 11/17/2021   Acute metabolic encephalopathy 11/11/2021   Thrombocytopenia 11/11/2021   Leukocytosis 10/10/2020   GERD (gastroesophageal reflux disease) 10/10/2020   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HCC) 05/06/2020   Hyponatremia 10/23/2019   MRSA bacteremia 10/21/2019   Atrial flutter with rapid ventricular response (HCC) 10/21/2019   Lactic acidosis 10/21/2019   Severe protein-calorie malnutrition    Diabetic polyneuropathy associated with type 2 diabetes mellitus (HCC)    Cirrhosis of liver (HCC) 10/19/2019   Anemia 10/19/2019   Medication monitoring encounter 08/15/2019   Serum total bilirubin elevated 10/29/2018   BPH (benign prostatic hyperplasia) 10/29/2018   Below-knee amputation of right lower  extremity (HCC)    PAF (paroxysmal atrial fibrillation) (HCC)    Hypokalemia    Hypomagnesemia    Sepsis without acute organ dysfunction (HCC) 10/05/2018   Depressed bipolar disorder (HCC) 10/05/2018   Hypertension 10/05/2018   Hyperbilirubinemia 10/05/2018   Homelessness 05/07/2018   Diabetes mellitus  type II, non insulin  dependent (HCC) 05/07/2018    ONSET DATE: 04/16/2024 (referral)   REFERRING DIAG: S10.487,S10.488 (ICD-10-CM) - S/P bilateral below knee amputation (HCC)  THERAPY DIAG:  Unsteadiness on feet  Other abnormalities of gait and mobility  Muscle weakness (generalized)  Repeated falls  Phantom limb syndrome with pain (HCC)  Rationale for Evaluation and Treatment: Rehabilitation  SUBJECTIVE:                                                                                                                                                                                             SUBJECTIVE STATEMENT: Pt presents in [facility], has one BKA prosthetic across his leg rests and the other leg in his backpack. Lost both of his shrinkers. Saw Beryl about a month ago and walked a few steps at [employer]. Has not been wearing his legs since because no one showed me how to put them on. Had a fall this AM, frequently tips forward in his WC because it is too small. States he is getting a new one from the church that fits better on Saturday. Current WC is 16x18.   Has had about 20 falls in the past 3 months, all from tipping in his WC. Is able to get back into the chair on his own. Wants to learn how to walk on his own.   Is currently homeless but states he is working with rehab professionals to get his own apartment and hopes to be in one by the end of the month.    Pt accompanied by: self  PERTINENT HISTORY: B BKA, a-fib, alcohol abuse, homelessness, DMT2, PE/PAF, polysubstance abuse  PAIN:  Are you having pain? No Reports soreness in his legs but nothing bad   PRECAUTIONS: Fall  RED FLAGS: None   WEIGHT BEARING RESTRICTIONS: No  FALLS: Has patient fallen in last 6 months? Yes. Number of falls >20   LIVING ENVIRONMENT: Lives with: lives with their family and is homeless Lives in: Other the sidewalk [place]  Stairs: No Has following equipment at  home: None  PLOF: Requires assistive device for independence and Goes to [employer] and socializes   PATIENT GOALS: I just freaking want to walk again   OBJECTIVE:  Note: Objective measures were completed at Evaluation unless otherwise noted.  DIAGNOSTIC FINDINGS: CT of head from 03/25/24 IMPRESSION: 1. Bilateral subdural hematomas extending over the cerebral convexities, with maximal thickness 1.1 cm on the left and 1.3 cm on the right. Secondary mass effect is balanced, with no midline shifting or overt downward mass effect. Unchanged. 2. 1.4 cm focal high posterior left frontal subdural bleed is also again noted. 3. No new intracranial hemorrhage is seen. 4. Bilateral displaced nasal bone fractures. Nondisplaced fracture anterior wall left maxillary sinus and left lower orbital rim. The age indeterminate but new from 01/10/2024. 5. Sinus disease.  No sinus fluid levels.  COGNITION: Overall cognitive status: Impaired   SENSATION: Pt denies numbness/tinging in his BLEs     EDEMA: Noted swelling in LLE, pt reports he fell on his leg this AM    POSTURE: rounded shoulders, forward head, and increased thoracic kyphosis  LOWER EXTREMITY ROM:     Active  Right Eval Left Eval  Hip flexion    Hip extension    Hip abduction    Hip adduction    Hip internal rotation    Hip external rotation    Knee flexion    Knee extension    Ankle dorsiflexion    Ankle plantarflexion    Ankle inversion    Ankle eversion     (Blank rows = not tested)  LOWER EXTREMITY MMT:    MMT Right Eval Left Eval  Hip flexion    Hip extension    Hip abduction    Hip adduction    Hip internal rotation    Hip external rotation    Knee flexion    Knee extension    Ankle dorsiflexion    Ankle plantarflexion    Ankle inversion    Ankle eversion    (Blank rows = not tested)  BED MOBILITY:  Not tested  TRANSFERS: Sit to stand: Max A  Assistive device utilized: Environmental consultant - 2 wheeled      Stand to sit: Mod A  Assistive device utilized: Environmental consultant - 2 wheeled     Unable to perform full sit to stand this date as pt's WC height is too low and pt required max A to stand.   RAMP:  Not tested  CURB:  Not tested  STAIRS: Not tested  GAIT: pt non-ambulatory at this time   FUNCTIONAL TESTS:  None appropriate on eval   VITALS  Vitals:   05/02/24 1241  BP: 112/66  Pulse: 82                                                                                                                                 TREATMENT:  Self-care/home management  Assessed vitals in RUE (see above) and WNL  Majority of eval spent educating pt on liners, how to don/doff liners and encouragement to wear liners during all awake hours to manage edema in residual limbs due to losing shrinkers. Informed pt to flip his foot plates  up so his pin is not resting against foot rests and bends. Pt verbalized understanding.  Pt reports he is to get new WC on Saturday, as his current WC is too small and does not lock, so pt is unsafe to perform transfers from it. Informed pt we can schedule WC eval, but will take time to receive new chair but this will be the safest option to set up proper transfers. Pt verbalized understanding.  Obtained second therapist to assist donning bilateral BKA prostheses. Able to don RLE w/min A but required total A x2 to don LLE due to swelling and improper pin alignment. Adjusted liner 4x and was finally able to get leg on w/max A.  Attempted sit to stand from [facility] to RW w/two therapists, but due to short height of WC, pt unable to successfully get into standing position even w/max A. Will try again next session from taller height.  At end of eval, unable to remove LLE prosthetic as the lock was stuck. Obtained clinic supervisor who was able to get pin to unlock, but took a lot of force. Informed pt he is not to try to put legs on and walk on his own right now as he is not safe and will need a  better WC and RW. Pt verbalized understanding.    PATIENT EDUCATION: Education details: PT POC, education on liners and wear schedule, see self-care above  Person educated: Patient Education method: Explanation, Demonstration, and Verbal cues Education comprehension: verbalized understanding, returned demonstration, verbal cues required, and needs further education  HOME EXERCISE PROGRAM: To be established   GOALS: Goals reviewed with patient? Yes  SHORT TERM GOALS: Target date: 05/30/2024    Pt will be independent w/donning/doffing liners for improved prosthetic management and skin integrity  Baseline: provided education on eval  Goal status: INITIAL  2.  Pt will perform sit to stand w/min A x1 and RW for improved BLE strength and weightbearing tolerance in bilateral prostheses  Baseline: unable to perform full stand on eval  Goal status: INITIAL  3.  Pt will either obtain new WC or schedule WC evaluation for improved positioning, safety w/transfers and pressure relief.  Baseline:  Goal status: INITIAL  4.  Gait training to be initiated as able and LTG updated  Baseline:  Goal status: INITIAL   LONG TERM GOALS: Target date: 06/13/2024   Pt will be independent w/donning/doffing liners and prosthetics for improved independence and progression towards gait training  Baseline:  Goal status: INITIAL  2.  Pt will perform sit to stand w/CGA and RW for improved BLE strength and weightbearing tolerance in bilateral prostheses  Baseline:  Goal status: INITIAL  3.  Pt will tolerate standing for 3 minutes for improved endurance and weightbearing tolerance  Baseline:  Goal status: INITIAL  4.  Gait training goal  Baseline:  Goal status: INITIAL   ASSESSMENT:  CLINICAL IMPRESSION: Patient is a 63 year old male referred to Neuro OPPT for bilateral BKA. Pt's PMH is significant for: B BKA, a-fib, alcohol abuse, homelessness, DMT2, PE/PAF, polysubstance abuse. The following  deficits were present during the exam: decreased functional strength, decreased safety awareness, improper body mechanics and decreased knowledge of prosthetic management. Based on recurrent falls, lack of knowledge on prosthetic management and unsafe WC, pt is an incr risk for falls. Pt would benefit from skilled PT to address these impairments and functional limitations to maximize functional mobility independence.    OBJECTIVE IMPAIRMENTS: Abnormal gait, decreased activity tolerance, decreased balance,  decreased cognition, decreased endurance, decreased knowledge of condition, decreased knowledge of use of DME, decreased mobility, difficulty walking, decreased strength, decreased safety awareness, increased edema, increased fascial restrictions, impaired perceived functional ability, improper body mechanics, prosthetic dependency , and pain.   ACTIVITY LIMITATIONS: carrying, lifting, bending, standing, squatting, stairs, transfers, hygiene/grooming, and locomotion level  PARTICIPATION LIMITATIONS: meal prep, cleaning, laundry, medication management, interpersonal relationship, driving, shopping, community activity, and yard work  PERSONAL FACTORS: Behavior pattern, Fitness, Past/current experiences, Social background, Transportation, and 1 comorbidity: Bilateral BKA are also affecting patient's functional outcome.   REHAB POTENTIAL: Fair due to lack of social support and access to safe DME  CLINICAL DECISION MAKING: Evolving/moderate complexity  EVALUATION COMPLEXITY: Moderate  PLAN:  PT FREQUENCY: 2x/week  PT DURATION: 6 weeks  PLANNED INTERVENTIONS: 97164- PT Re-evaluation, 97750- Physical Performance Testing, 97110-Therapeutic exercises, 97530- Therapeutic activity, [id number]- Neuromuscular re-education, 97535- Self Care, [id number]- Manual therapy, [id number]- Gait training, [phone]- Prosthetic Initial , [phone]- Orthotic/Prosthetic subsequent, Patient/Family education, Balance training, Stair training,  DME instructions, and Wheelchair mobility training  PLAN FOR NEXT SESSION: Is pt wearing liners? Did he get new WC? Work on sit to stands as able, if he did not get safe WC we will need to schedule WC eval.    Kahlin Mark E Vermell Madrid, PT, DPT 05/02/2024, 1:39 PM

## 2024-05-04 ENCOUNTER — Emergency Department (HOSPITAL_COMMUNITY)
Admission: EM | Admit: 2024-05-04 | Discharge: 2024-05-04 | Disposition: A | Discharge status: Home / Self Care | Attending: Emergency Medicine | Admitting: Emergency Medicine

## 2024-05-04 ENCOUNTER — Emergency Department (HOSPITAL_COMMUNITY)

## 2024-05-04 ENCOUNTER — Encounter (HOSPITAL_COMMUNITY): Payer: Self-pay

## 2024-05-04 DIAGNOSIS — E119 Type 2 diabetes mellitus without complications: Secondary | ICD-10-CM | POA: Diagnosis not present

## 2024-05-04 DIAGNOSIS — I480 Paroxysmal atrial fibrillation: Secondary | ICD-10-CM | POA: Diagnosis present

## 2024-05-04 DIAGNOSIS — I10 Essential (primary) hypertension: Secondary | ICD-10-CM | POA: Insufficient documentation

## 2024-05-04 DIAGNOSIS — Z5900 Homelessness unspecified: Secondary | ICD-10-CM | POA: Diagnosis not present

## 2024-05-04 LAB — CBC WITH DIFFERENTIAL/PLATELET
Abs Immature Granulocytes: 0.01 K/uL (ref 0.00–0.07)
Basophils Absolute: 0.1 K/uL (ref 0.0–0.1)
Basophils Relative: 1 %
Eosinophils Absolute: 0.3 K/uL (ref 0.0–0.5)
Eosinophils Relative: 6 %
HCT: 36.7 % — ABNORMAL LOW (ref 39.0–52.0)
Hemoglobin: 12.3 g/dL — ABNORMAL LOW (ref 13.0–17.0)
Immature Granulocytes: 0 %
Lymphocytes Relative: 19 %
Lymphs Abs: 1.1 K/uL (ref 0.7–4.0)
MCH: 33.2 pg (ref 26.0–34.0)
MCHC: 33.5 g/dL (ref 30.0–36.0)
MCV: 98.9 fL (ref 80.0–100.0)
Monocytes Absolute: 0.5 K/uL (ref 0.1–1.0)
Monocytes Relative: 9 %
Neutro Abs: 3.7 K/uL (ref 1.7–7.7)
Neutrophils Relative %: 65 %
Platelets: 108 K/uL — ABNORMAL LOW (ref 150–400)
RBC: 3.71 MIL/uL — ABNORMAL LOW (ref 4.22–5.81)
RDW: 13.9 % (ref 11.5–15.5)
WBC: 5.7 K/uL (ref 4.0–10.5)
nRBC: 0 % (ref 0.0–0.2)

## 2024-05-04 LAB — COMPREHENSIVE METABOLIC PANEL WITH GFR
ALT: 14 U/L (ref 0–44)
AST: 26 U/L (ref 15–41)
Albumin: 3.2 g/dL — ABNORMAL LOW (ref 3.5–5.0)
Alkaline Phosphatase: 87 U/L (ref 38–126)
Anion gap: 14 (ref 5–15)
BUN: 12 mg/dL (ref 8–23)
CO2: 19 mmol/L — ABNORMAL LOW (ref 22–32)
Calcium: 8.8 mg/dL — ABNORMAL LOW (ref 8.9–10.3)
Chloride: 108 mmol/L (ref 98–111)
Creatinine, Ser: 0.72 mg/dL (ref 0.61–1.24)
GFR, Estimated: >60 mL/min (ref >60)
Glucose, Bld: 155 mg/dL — ABNORMAL HIGH (ref 70–99)
Potassium: 3.5 mmol/L (ref 3.5–5.1)
Sodium: 141 mmol/L (ref 135–145)
Total Bilirubin: 1.4 mg/dL — ABNORMAL HIGH (ref 0.0–1.2)
Total Protein: 6.9 g/dL (ref 6.5–8.1)

## 2024-05-04 LAB — TROPONIN I (HIGH SENSITIVITY)
Troponin I (High Sensitivity): 4 ng/L (ref <18)
Troponin I (High Sensitivity): 3 ng/L (ref <18)

## 2024-05-04 NOTE — Discharge Instructions (Signed)
 You are seen in the ER today for concerns of A-fib.  You were found to be in A-fib but were in a rate controlled level which is reassuring.  I do think you would benefit from follow-up with cardiology for medication control and management regarding her A-fib.  I have placed a referral to cardiology for you for this.  For any concerns of worsening symptoms, return to the emergency department.
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
## 2024-05-15 NOTE — Progress Notes (Addendum)
 The patient attended a screening event on 01/15/24 where his screening results were BP 124/84 and blood glucose 123mg /dl(prediabetic range). At the event the patient noted he had food, transportation and housing SDOH insecurities. Pt noted they do have a pcp. Patient was given SDOH Resources at the event. Pt did not leave a phone number or address. Per chart review pt does have a pcp listed but has not seen them within the last year. Chart review also indicates a few future rehab appts. [facility] will send pt sdoh and pcp resources to the address on file. As well as diabetes, hypertension, tobacco and healthy eating resources via mail. An additional follow up will be done in according to the health equity team's protocol.

## 2024-05-16 ENCOUNTER — Ambulatory Visit: Admitting: Physical Therapy

## 2024-05-20 ENCOUNTER — Other Ambulatory Visit: Payer: Self-pay | Admitting: Orthopedic Surgery

## 2024-05-20 ENCOUNTER — Ambulatory Visit: Admitting: Physical Therapy

## 2024-05-20 DIAGNOSIS — Z89511 Acquired absence of right leg below knee: Secondary | ICD-10-CM

## 2024-05-20 DIAGNOSIS — R296 Repeated falls: Secondary | ICD-10-CM

## 2024-05-20 DIAGNOSIS — R2689 Other abnormalities of gait and mobility: Secondary | ICD-10-CM

## 2024-05-20 DIAGNOSIS — R2681 Unsteadiness on feet: Secondary | ICD-10-CM | POA: Diagnosis not present

## 2024-05-20 DIAGNOSIS — M6281 Muscle weakness (generalized): Secondary | ICD-10-CM

## 2024-05-20 NOTE — Therapy (Signed)
 OUTPATIENT PHYSICAL THERAPY PROSTHETICS TREATMENT - DISCHARGE SUMMARY    Patient Name: Cory Palmer MRN: [id number] DOB:January 28, 1961, 63 y.o., male Today's Date: 05/20/2024   PCP: Delores Rojelio Caldron, NP REFERRING PROVIDER: Harden Jerona GAILS, MD  PHYSICAL THERAPY DISCHARGE SUMMARY  Visits from Start of Care: 2  Current functional level related to goals / functional outcomes: Pt requires WC for mobility    Remaining deficits: Bilateral BKA, frequent falls due to improper WC, BLE weakness    Education / Equipment: Scheduled WC evaluation    Patient agrees to discharge. Patient goals were not met. Patient is being discharged due to need for proper WC in order to safely perform ADLS and IADLS.   END OF SESSION:  PT End of Session - 05/20/24 1323     Visit Number 2    Number of Visits 13    Date for Recertification  06/20/24    Authorization Type [employer] Dual Complete    PT Start Time 1318    PT Stop Time 1337   Pausing PT until new WC obtained   PT Time Calculation (min) 19 min    Equipment Utilized During Treatment Other (comment)   Bilateral BKA prosthetics   Activity Tolerance Patient tolerated treatment well    Behavior During Therapy WFL for tasks assessed/performed           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   Past Surgical History:  Procedure Laterality Date   AMPUTATION Left 09/28/2018   Procedure: LEFT TRANSMETATARSAL AMPUTATION;  Surgeon: Harden Jerona GAILS, MD;  Location: [facility] OR;  Service: Orthopedics;  Laterality: Left;   AMPUTATION Left 10/23/2019   Procedure: AMPUTATION BELOW KNEE;  Surgeon: Harden Jerona GAILS, MD;  Location[facility]  OR;  Service: Orthopedics;  Laterality: Left;   BELOW KNEE LEG AMPUTATION Right    ESOPHAGOGASTRODUODENOSCOPY (EGD) WITH PROPOFOL  N/A 10/19/2020   Procedure: ESOPHAGOGASTRODUODENOSCOPY (EGD) WITH PROPOFOL ;  Surgeon: Kristie Lamprey, MD;  Location: [facility] ENDOSCOPY;  Service: Endoscopy;  Laterality: N/A;   TEE WITHOUT CARDIOVERSION N/A 10/25/2019   Procedure: TRANSESOPHAGEAL ECHOCARDIOGRAM (TEE);  Surgeon: Pietro Redell RAMAN, MD;  Location[facility] ENDOSCOPY;  Service: Cardiovascular;  Laterality: N/A;   Patient Active Problem List   Diagnosis Date Noted   Traumatic subdural hematoma (SDH) (HCC) 03/24/2024   GAD (generalized anxiety disorder) 06/08/2023   Acute pulmonary embolism (HCC) 12/13/2022   Tobacco use 12/13/2022   Substance induced mood disorder (HCC) 11/08/2022   Pneumonia 08/26/2022   Suicide ideation 06/26/2022   Alcohol abuse 04/10/2022   Cocaine abuse with cocaine-induced mood disorder (HCC) 04/10/2022   Adjustment disorder with mixed anxiety and depressed mood 04/10/2022   Alcohol-induced mood disorder (HCC) 04/10/2022   Severe sepsis (HCC) 03/24/2022   Abdominal pain 03/24/2022   Nausea vomiting and diarrhea 03/24/2022   Prolonged QT interval 03/24/2022   Macrocytosis without anemia 03/24/2022   Sepsis (HCC) 03/24/2022   Acute hepatic encephalopathy (HCC) 11/17/2021   PVD (peripheral vascular disease) 11/17/2021   Metabolic acidosis 11/17/2021   Esophagitis 11/17/2021   Acute metabolic encephalopathy 11/11/2021   Thrombocytopenia 11/11/2021   Leukocytosis 10/10/2020   GERD (gastroesophageal  reflux disease) 10/10/2020   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HCC) 05/06/2020   Hyponatremia 10/23/2019   MRSA bacteremia 10/21/2019   Atrial flutter with rapid ventricular response (HCC) 10/21/2019   Lactic acidosis 10/21/2019   Severe protein-calorie malnutrition    Diabetic polyneuropathy associated with type 2 diabetes mellitus (HCC)    Cirrhosis of liver (HCC) 10/19/2019    Anemia 10/19/2019   Medication monitoring encounter 08/15/2019   Serum total bilirubin elevated 10/29/2018   BPH (benign prostatic hyperplasia) 10/29/2018   Below-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)    PAF (paroxysmal atrial fibrillation) (HCC)    Hypokalemia    Hypomagnesemia    Sepsis without acute organ dysfunction (HCC) 10/05/2018   Depressed bipolar disorder (HCC) 10/05/2018   Hypertension 10/05/2018   Hyperbilirubinemia 10/05/2018   Homelessness 05/07/2018   Diabetes mellitus type II, non insulin  dependent (HCC) 05/07/2018    ONSET DATE: 04/16/2024 (referral)   REFERRING DIAG: S10.487,S10.488 (ICD-10-CM) - S/P bilateral below knee amputation (HCC)  THERAPY DIAG:  Unsteadiness on feet  Other abnormalities of gait and mobility  Muscle weakness (generalized)  Repeated falls  Rationale for Evaluation and Treatment: Rehabilitation  SUBJECTIVE:                                                                                                                                                                                             SUBJECTIVE STATEMENT: Pt presents in 18x16 WC, states he did not get the other chair because the guy went to work somewhere else. States he has had several falls since his eval, unsure how many but all due to his WC tipping over. Denies pain today. Has been wearing his liners.    Pt accompanied by: self  PERTINENT HISTORY: B BKA, a-fib, alcohol abuse, homelessness, DMT2, PE/PAF, polysubstance abuse  PAIN:  Are you having pain? No Reports soreness in his legs but nothing bad   PRECAUTIONS: Fall  RED FLAGS: None   WEIGHT BEARING RESTRICTIONS: No  FALLS: Has patient fallen in last 6 months? Yes. Number of falls >20   LIVING ENVIRONMENT: Lives with: lives with their family and is homeless Lives in: Other the sidewalk [place]  Stairs: No Has following equipment at home: None  PLOF: Requires  assistive device for independence and Goes to [employer] and socializes   PATIENT GOALS: I just freaking want to walk again   OBJECTIVE:  Note: Objective measures were completed at Evaluation unless otherwise noted.  DIAGNOSTIC FINDINGS: CT of head from 03/25/24 IMPRESSION: 1. Bilateral subdural hematomas extending over the cerebral convexities, with maximal thickness 1.1  cm on the left and 1.3 cm on the right. Secondary mass effect is balanced, with no midline shifting or overt downward mass effect. Unchanged. 2. 1.4 cm focal high posterior left frontal subdural bleed is also again noted. 3. No new intracranial hemorrhage is seen. 4. Bilateral displaced nasal bone fractures. Nondisplaced fracture anterior wall left maxillary sinus and left lower orbital rim. The age indeterminate but new from 01/10/2024. 5. Sinus disease.  No sinus fluid levels.  COGNITION: Overall cognitive status: Impaired   SENSATION: Pt denies numbness/tinging in his BLEs     EDEMA: Noted swelling in LLE, pt reports he fell on his leg this AM    POSTURE: rounded shoulders, forward head, and increased thoracic kyphosis  LOWER EXTREMITY ROM:     Active  Right Eval Left Eval  Hip flexion    Hip extension    Hip abduction    Hip adduction    Hip internal rotation    Hip external rotation    Knee flexion    Knee extension    Ankle dorsiflexion    Ankle plantarflexion    Ankle inversion    Ankle eversion     (Blank rows = not tested)  LOWER EXTREMITY MMT:    MMT Right Eval Left Eval  Hip flexion    Hip extension    Hip abduction    Hip adduction    Hip internal rotation    Hip external rotation    Knee flexion    Knee extension    Ankle dorsiflexion    Ankle plantarflexion    Ankle inversion    Ankle eversion    (Blank rows = not tested)  BED MOBILITY:  Not tested  TRANSFERS: Sit to stand: Max A  Assistive device utilized: Environmental consultant - 2 wheeled     Stand to sit: Mod A   Assistive device utilized: Environmental consultant - 2 wheeled     Unable to perform full sit to stand this date as pt's WC height is too low and pt required max A to stand.   RAMP:  Not tested  CURB:  Not tested  STAIRS: Not tested  GAIT: pt non-ambulatory at this time   FUNCTIONAL TESTS:  None appropriate on eval   VITALS  There were no vitals filed for this visit.                                                                                                                                TREATMENT:  Self-care/home management  Discussed importance of proper-fitting WC in order to improve safety, perform transfers and work on gait training. Pt reports he agrees with this and is in agreement to DC from PT until he has new WC as PT cannot help him in his current chair. Pt scheduled for [facility] evaluation tomorrow and pt provided w/clinic number in case he needs to call and reschedule. Pt reports he should be getting his own  apartment soon as well, so he could get a RW and store it safely. Pt in agreement with plan to pause PT until he receives new chair.    PATIENT EDUCATION: Education details: See above  Person educated: Patient Education method: Explanation, Demonstration, and Verbal cues Education comprehension: verbalized understanding, returned demonstration, verbal cues required, and needs further education  HOME EXERCISE PROGRAM: To be established   GOALS: ALL GOALS DC  Goals reviewed with patient? Yes  SHORT TERM GOALS: Target date: 05/30/2024    Pt will be independent w/donning/doffing liners for improved prosthetic management and skin integrity  Baseline: provided education on eval  Goal status: INITIAL  2.  Pt will perform sit to stand w/min A x1 and RW for improved BLE strength and weightbearing tolerance in bilateral prostheses  Baseline: unable to perform full stand on eval  Goal status: INITIAL  3.  Pt will either obtain new WC or schedule WC evaluation for improved  positioning, safety w/transfers and pressure relief.  Baseline:  Goal status: INITIAL  4.  Gait training to be initiated as able and LTG updated  Baseline:  Goal status: INITIAL   LONG TERM GOALS: Target date: 06/13/2024   Pt will be independent w/donning/doffing liners and prosthetics for improved independence and progression towards gait training  Baseline:  Goal status: INITIAL  2.  Pt will perform sit to stand w/CGA and RW for improved BLE strength and weightbearing tolerance in bilateral prostheses  Baseline:  Goal status: INITIAL  3.  Pt will tolerate standing for 3 minutes for improved endurance and weightbearing tolerance  Baseline:  Goal status: INITIAL  4.  Gait training goal  Baseline:  Goal status: INITIAL   ASSESSMENT:  CLINICAL IMPRESSION: Emphasis of skilled PT session on pt education regarding safety w/WC and plan to DC from PT until he receives custom chair. Pt reports he has had several falls in his chair since eval due to chair tipping over and pt's residual limbs dragging on floor due to low chair height. Pt scheduled for [facility] evaluation tomorrow in and pt in agreement to pause PT until he obtains new WC as his current chair is unsafe and due to homelessness, pt does not have RW that he can use to work on gait training outside of clinic. All goals DC as this is pt's first treatment session since eval.    OBJECTIVE IMPAIRMENTS: Abnormal gait, decreased activity tolerance, decreased balance, decreased cognition, decreased endurance, decreased knowledge of condition, decreased knowledge of use of DME, decreased mobility, difficulty walking, decreased strength, decreased safety awareness, increased edema, increased fascial restrictions, impaired perceived functional ability, improper body mechanics, prosthetic dependency , and pain.   ACTIVITY LIMITATIONS: carrying, lifting, bending, standing, squatting, stairs, transfers, hygiene/grooming, and locomotion  level  PARTICIPATION LIMITATIONS: meal prep, cleaning, laundry, medication management, interpersonal relationship, driving, shopping, community activity, and yard work  PERSONAL FACTORS: Behavior pattern, Fitness, Past/current experiences, Social background, Transportation, and 1 comorbidity: Bilateral BKA are also affecting patient's functional outcome.   REHAB POTENTIAL: Fair due to lack of social support and access to safe DME  CLINICAL DECISION MAKING: Evolving/moderate complexity  EVALUATION COMPLEXITY: Moderate  PLAN:  PT FREQUENCY: 2x/week  PT DURATION: 6 weeks  PLANNED INTERVENTIONS: 97164- PT Re-evaluation, 97750- Physical Performance Testing, 97110-Therapeutic exercises, 97530- Therapeutic activity, [id number]- Neuromuscular re-education, 97535- Self Care, [id number]- Manual therapy, [phone]- Gait training, [phone]- Prosthetic Initial , [phone]- Orthotic/Prosthetic subsequent, Patient/Family education, Balance training, Stair training, DME instructions, and Wheelchair mobility training    [address]  E [place] Kuang, PT, DPT 05/20/2024, 1:55 PM

## 2024-05-21 ENCOUNTER — Ambulatory Visit: Admitting: Physical Therapy

## 2024-05-21 ENCOUNTER — Encounter: Payer: Self-pay | Admitting: Physical Therapy

## 2024-05-21 ENCOUNTER — Other Ambulatory Visit: Payer: Self-pay

## 2024-05-21 DIAGNOSIS — R2681 Unsteadiness on feet: Secondary | ICD-10-CM | POA: Diagnosis not present

## 2024-05-21 DIAGNOSIS — R2689 Other abnormalities of gait and mobility: Secondary | ICD-10-CM

## 2024-05-21 DIAGNOSIS — G546 Phantom limb syndrome with pain: Secondary | ICD-10-CM

## 2024-05-21 DIAGNOSIS — M6281 Muscle weakness (generalized): Secondary | ICD-10-CM

## 2024-05-21 DIAGNOSIS — R296 Repeated falls: Secondary | ICD-10-CM

## 2024-05-21 NOTE — Therapy (Signed)
 OUTPATIENT PHYSICAL THERAPY WHEELCHAIR EVALUATION   Patient Name: Cory Palmer MRN: [id number] DOB:1961-06-01, 63 y.o., male Today's Date: 05/21/2024  END OF SESSION:  PT End of Session - 05/21/24 1000     Visit Number 1    Number of Visits 1    Date for Recertification  05/21/24    Authorization Type Medicare A&B    PT Start Time 0954    PT Stop Time 1033    PT Time Calculation (min) 39 min    Equipment Utilized During Treatment Other (comment);Gait belt   Bilateral BKA prosthetics   Activity Tolerance Patient tolerated treatment well    Behavior During Therapy WFL for tasks assessed/performed          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   Past Surgical History:  Procedure Laterality Date   AMPUTATION Left 09/28/2018   Procedure: LEFT TRANSMETATARSAL AMPUTATION;  Surgeon: Harden Jerona GAILS, MD;  Location: [facility] OR;  Service: Orthopedics;  Laterality: Left;   AMPUTATION Left 10/23/2019   Procedure: AMPUTATION BELOW KNEE;  Surgeon: Harden Jerona GAILS, MD;  Location: [facility] OR;  Service: Orthopedics;  Laterality: Left;   BELOW KNEE LEG AMPUTATION Right    ESOPHAGOGASTRODUODENOSCOPY (EGD) WITH PROPOFOL  N/A 10/19/2020   Procedure: ESOPHAGOGASTRODUODENOSCOPY (EGD) WITH PROPOFOL ;  Surgeon: Kristie Lamprey, MD;  Location: [facility] ENDOSCOPY;  Service: Endoscopy;  Laterality: N/A;   TEE WITHOUT CARDIOVERSION N/A 10/25/2019   Procedure: TRANSESOPHAGEAL ECHOCARDIOGRAM (TEE);  Surgeon: Pietro Redell RAMAN, MD;  Location: [facility] ENDOSCOPY;  Service: Cardiovascular;  Laterality: N/A;   Patient Active Problem List   Diagnosis Date Noted   Traumatic subdural hematoma (SDH)  (HCC) 03/24/2024   GAD (generalized anxiety disorder) 06/08/2023   Acute pulmonary embolism (HCC) 12/13/2022   Tobacco use 12/13/2022   Substance induced mood disorder (HCC) 11/08/2022   Pneumonia 08/26/2022   Suicide ideation 06/26/2022   Alcohol abuse 04/10/2022   Cocaine abuse with cocaine-induced mood disorder (HCC) 04/10/2022   Adjustment disorder with mixed anxiety and depressed mood 04/10/2022   Alcohol-induced mood disorder (HCC) 04/10/2022   Severe sepsis (HCC) 03/24/2022   Abdominal pain 03/24/2022   Nausea vomiting and diarrhea 03/24/2022   Prolonged QT interval 03/24/2022   Macrocytosis without anemia 03/24/2022   Sepsis (HCC) 03/24/2022   Acute hepatic encephalopathy (HCC) 11/17/2021   PVD (peripheral vascular disease) 11/17/2021   Metabolic acidosis 11/17/2021   Esophagitis 11/17/2021   Acute metabolic encephalopathy 11/11/2021   Thrombocytopenia 11/11/2021   Leukocytosis 10/10/2020   GERD (gastroesophageal reflux disease) 10/10/2020   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HCC) 05/06/2020   Hyponatremia 10/23/2019   MRSA bacteremia 10/21/2019   Atrial flutter with rapid ventricular response (HCC) 10/21/2019   Lactic acidosis 10/21/2019   Severe protein-calorie malnutrition    Diabetic polyneuropathy associated with type 2 diabetes mellitus (HCC)    Cirrhosis of liver (HCC) 10/19/2019   Anemia 10/19/2019   Medication monitoring encounter 08/15/2019   Serum total bilirubin elevated 10/29/2018   BPH (benign prostatic hyperplasia) 10/29/2018   Below-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)    PAF (paroxysmal atrial fibrillation) (HCC)  Hypokalemia    Hypomagnesemia    Sepsis without acute organ dysfunction (HCC) 10/05/2018   Depressed bipolar disorder (HCC) 10/05/2018   Hypertension 10/05/2018   Hyperbilirubinemia 10/05/2018   Homelessness 05/07/2018   Diabetes mellitus type II, non insulin  dependent (HCC) 05/07/2018    PCP: Delores Rojelio Caldron, NP  REFERRING PROVIDER:  Harden Jerona GAILS, MD  THERAPY DIAG:  Unsteadiness on feet  Other abnormalities of gait and mobility  Muscle weakness (generalized)  Repeated falls  Phantom limb syndrome with pain [facility])  Rationale for Evaluation and Treatment Rehabilitation  SUBJECTIVE:                                                                                                                                                                                           SUBJECTIVE STATEMENT: Pt presents for wheelchair evaluation. He is interested in a power wheelchair due to ongoing mobility issues with his prosthetics and falling out of his current manual wheelchair which is ill fitted and in poor repair.  PRECAUTIONS: Fall  RED FLAGS: None  WEIGHT BEARING RESTRICTIONS No    OCCUPATION: Disabled  PLOF:  Requires assistive device for independence and Pt reports he can bathe and dress himself, has limited access to meal prep  PATIENT GOALS: to obtain power wheeled mobility to allow for safe and independent pressure relief, safe and independent mobility, and ability to complete MRADLs in a safe and timely manner           MEDICAL HISTORY:  Primary diagnosis onset: 2020     Medical Diagnosis with ICD-10 code: [phone] (ICD-10-CM) - S/P bilateral below knee amputation (HCC)   [] Progressive disease  Relevant future surgeries:   N/A  Height: 6 ft (w/ prosthetics) Weight: 241 lb and 13.5 oz Explain recent changes or trends in weight:    Pt reports mild weight gain due to immobility.  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        Cardio Status:  Functional Limitations: atrial flutter w/ RVR and Paroxysmal atrial fibrillation  [] Intact  [x]  Impaired      Respiratory Status:  Functional Limitations:   [x] Intact  [] Impaired   [] SOB [] COPD [] O2 Dependent ______LPM  [] Ventilator Dependent  Resp equip:  Objective Measure(s):   Orthotics:   [x] Amputee:                   Bilateral BKA                                          [x] Prosthesis: Bilateral BKA       HOME ENVIRONMENT:  [] House [] Condo/town home [x] Apartment [] Asst living [] LTCF         [] Own  [x] Rent; social worker has pt an apt, move-in date is tentatively the first week of November  [x] Lives alone [] Lives with others -                             Hours without assistance: 24 hrs  [x] Home is accessible to patient       pt is unsure of layout of future housing                          Storage of wheelchair:  [x] In home   [] Other Comments:        COMMUNITY :  TRANSPORTATION:  [] Car [] Engineer, Mining [] Adapted w/c Lift []  Ambulance [] Other:                     [x] Sits in wheelchair during transport   Where is w/c stored during transport?  [x] [employer]  []  [employer]  r   [] Self-Driver       Drive while in  Biomedical Scientist [] yes [] no   Employment and/or school:  Specific requirements pertaining to mobility   N/A     Other:  COMMUNICATION:  Verbal Communication  [] WFL [] receptive [] WFL [] expressive [x] Understandable  [] Difficult to understand  [] non-communicative  Primary Language:_______English_______ 2nd:_____N/A________  Communication provided by:[x] Patient [] Family [] Caregiver [] Translator   [] Uses an augmentative communication device     Manufacturer/Model :                                                                MOBILITY/BALANCE:  Sitting Balance  Standing Balance  Transfers  Ambulation   [] WFL      [] WFL  [] Independent  []  Independent   [x] Uses UE for balance in sitting  Comments: pt is unable to don prosthetics due to edema today [x] Uses UE/device for stability Comments: pt standing limited by inability to don prosthetics some days due to edema fluctuation [x]  Min assist  []  Ambulates independently with       device:___________________      [x]  Mod assist  []  Able to ambulate ______ feet        safely/functionally/independently   []  Min assist  [x]  Min assist  [x]  Max assist  [x]  Non-functional ambulator         History/High risk of falls   []  Mod assist  []  Mod assist  [x]  Dependent  []  Unable to ambulate   []  Max  assist  []  Max assist  Transfer method:[] 1 person [] 2 person [] sliding board [] squat pivot [] stand pivot [] mechanical patient lift  [x] other: pt tends to slide onto knees on blanket to lay on ground  []   Unable  []  Unable    Fall History: # of falls in the past 6 months? At least 12; usually out of his current manual wheelchair # of "near" falls in the past 6 months? Several    CURRENT SEATING / MOBILITY:  Current Mobility Device: [] None [] Cane/Walker [x] Manual [] Dependent [] Dependent w/ Tilt rScooter  [] Power (type of control):   Manufacturer: Drive Model:  Serial #:   Size: 18 inches x 16 inches x 18 inches Color: Black Age: unknown  Purchased by whom: donated by a church  Current condition of mobility base:  brakes do not lock, height is too short as residual limb rests on foot pedals, pt resting in PPT at risk of sliding out of seat, tires bald, castors bald, no cushion  Current seating system:          Drive Manual wheelchair                        Age of seating system:  6 months  Describe posture in present seating system: posterior pelvic tilt, residual limbs resting on foot rest, hiked shoulders on arm rests   Is the current mobility meeting medical necessity?:  [] Yes [x] No Describe: Pt requires supportive seating system that facilitates functional transfers and safe donning of prosthetics - his current seating system (manual wheelchair) is  too low and too short of a seat allowing his residual limb to rest on footrests placing him at increased risk of LE edema and wound development.  He rests in posterior pelvic tilt which is contrary to posture needed to initiate functional transfers including movements for pressure relief.  Poor tire tread makes self-propulsion and manual wheeled mobility less efficient and places increased strain on shoulders.                                    Ability to complete Mobility-Related Activities of Daily Living (MRADL's) with Current Mobility Device:   Move room to room  [x] Independent  [x] Min [x] Mod [] Max assist  [] Unable  Comments: Pt has fatigue and arm strain that limits self-propulsion in addition to poor tread on tires of current mobility device.  He is independent with most MRADLs from wheelchair level.  Meal prep is limited by circumstance.  Meal prep  [] Independent  [] Min [] Mod [] Max assist  [x] Unable    Feeding  [x] Independent  [] Min [] Mod [] Max assist  [] Unable    Bathing  [x] Independent  [] Min [] Mod [] Max assist  [] Unable    Grooming  [x] Independent  [] Min [] Mod [] Max assist  [] Unable    UE dressing  [x] Independent  [] Min [] Mod [] Max assist  [] Unable    LE dressing  [x] Independent   [] Min [] Mod [] Max assist  [] Unable    Toileting  [x] Independent  [] Min [] Mod [] Max assist  [] Unable    Bowel Mgt: [x]  Continent []  Incontinent []  Accidents []  Diapers []  Colostomy []  Bowel Program:  Bladder Mgt: [x]  Continent []  Incontinent []  Accidents []  Diapers []  Urinal []  Intermittent Cath []  Indwelling Cath []  Supra-pubic Cath     Current Mobility Equipment Trialed/ Ruled Out:    Does not meet mobility needs due to:    Oneil all boxes that indicate inability to use the specific equipment listed     Meets needs for safe  independent functional  ambulation  / mobility    Risk of  Falling or History of Falls  Enviromental limitations      Cognition    Safety concerns with  physical ability     Decreased / limitations endurance  & strength     Decreased / limitations  motor skills  & coordination    Pain    Pace /  Speed    Cardiac and/or  respiratory condition    Contra - indicated by diagnosis   Cane/Crutches  []   [x]   []   []   [x]   [x]   [x]   [x]   [x]   [x]   []    Walker / Rollator  []  NA   []   [x]   []   []   [x]   [x]   [x]   [x]   [x]   [x]   []     Manual Wheelchair [id number]:  []  NA  []   [x]   [x]   []   [x]   [x]   []   [x]   [x]   [x]   []    Manual W/C (K0005) with power assist  []  NA  []   [x]   [x]   []   [x]   [x]   [x]   [x]   [x]   [x]   []    Scooter  []  NA  []   [x]   []   []   [x]   []   []   [x]   [x]   []   []    Power Wheelchair: standard joystick  []  NA  [x]   []   []   []   []   []   []   []   []   []   []    Power Wheelchair: alternative controls  [x]  NA  []   []   []   []   []   []   []   []   []   []   []    Summary:  The least costly alternative for independent functional mobility was found to be:    []  Crutch/Cane  []  Walker []  Manual w/c  []  Manual w/c with power assist   []  Scooter   [x]  Power w/c std joystick   []  Power w/c alternative control        []  Requires dependent care mobility device   Cabin Crew for [employer] skills are adequate for safe mobility equipment operation  [x]   Yes []   No  Patient is willing and motivated to use recommended mobility equipment  [x]   Yes []   No       []  Patient is unable to safely operate mobility equipment independently and requires dependent care equipment Comments:           SENSATION and SKIN ISSUES:  Sensation []  Intact  []  Impaired []  Absent [x]  Hyposensate []  Hypersensate  []  Defensiveness  Location(s) of impairment: below knee of BLE   Pressure Relief Method(s):  [x]  Lean side to side to offload (without risk of falling)  []   W/C push up (4+ times/hour for 15+ seconds) []  Stand up (without risk of falling)    []  Other: (Describe): Effective pressure relief method(s) above can be performed consistently throughout  the day: [x] Yes  []  No If not, Why?:  Skin Integrity Risk:       [x]  Low risk           []  Moderate risk            []  High risk  If high risk, explain:   Skin Issues/Skin Integrity  Current skin Issues  [x]  Yes []  No []  Intact  []   Red area   []   Open area  [x]  Scar tissue  [x]  At risk from prolonged sitting  Where: Distal LE - hyposensate and scarring from transfers,  some areas of new granulation from prior open areas caused with transfers History of Skin Issues  []  Yes [x]  No Where : When: Stage: Hx of skin flap surgeries  []  Yes [x]  No Where:  When:  Pain: [x]  Yes []  No   Pain Location(s): neck, head, BLE Intensity scale: (0-10) : 10/10 How does pain interfere with mobility and/or MRADLs? - stiffness and general pain limit his ability to reposition and self-propel his wheelchair due to strain/pressure        MAT EVALUATION:  Neuro-Muscular Status: (Tone, Reflexive, Responses, etc.)     [x]   Intact   []  Spasticity:  []  Hypotonicity  []  Fluctuating  []  Muscle Spasms  []  Poor Righting Reactions/Poor Equilibrium Reactions  []  Primal Reflex(s):    Comments:            COMMENTS:    POSTURE:     Comments:  Pelvis Anterior/Posterior:  []  Neutral   [x]  Posterior  []  Anterior  []  Fixed - No movement [x]  Tendency away from neutral []  Flexible []  Self-correction []  External correction Obliquity (viewed from front)  [x]  WFL []  R Obliquity []  L Obliquity  []  Fixed - No movement []  Tendency away from neutral []  Flexible []  Self-correction []  External correction Rotation  [x]  WFL []  R anterior []  L anterior  []  Fixed - No movement []  Tendency away from neutral []  Flexible []  Self-correction []  External correction Tonal Influence Pelvis:  [x]  Normal []  Flaccid []  Low tone []  Spasticity []  Dystonia []  Pelvis thrust []  Other:    Trunk Anterior/Posterior:  []  WFL [x]  Thoracic kyphosis []  Lumbar lordosis  []  Fixed - No movement []  Tendency  away from neutral []  Flexible []  Self-correction []  External correction  [x]  WFL []  Convex to left  []  Convex to right []  S-curve   []  C-curve []  Multiple curves []  Tendency away from neutral []  Flexible []  Self-correction []  External correction Rotation of shoulders and upper trunk:  []  Neutral [x]  Left-anterior [x]  Right- anterior []  Fixed- no movement []  Tendency away from neutral []  Flexible []  Self correction []  External correction Tonal influence Trunk:  [x]  Normal []  Flaccid []  Low tone []  Spasticity []  Dystonia []  Other:   Head & Neck  []  Functional [x]  Flexed    []  Extended []  Rotated right  []  Rotated left []  Laterally flexed right []  Laterally flexed left []  Cervical hyperextension   [x]  Good head control []  Adequate head control []  Limited head control []  Absent head control Describe tone/movement of head and neck: mild kyphotic and forward head posture, posterior pelvic tilt maintained due to dimensions of wheelchair and pt body habitus     Lower Extremity Measurements: LE ROM:  Active ROM Right 05/21/2024 Left 05/21/2024  Hip flexion WNL WNL  Hip extension    Hip abduction    Hip adduction    Knee flexion    Knee extension    Ankle dorsiflexion    Ankle plantarflexion     (Blank rows = not tested)  LE MMT:  MMT Right 05/21/2024 Left 05/21/2024  Hip flexion 4/5 4/5  Hip extension    Hip abduction 4/5 4/5  Hip adduction 4/5 4/5  Knee flexion 5/5 5/5  Knee extension 4+/5 4+/5  Ankle dorsiflexion    Ankle plantarflexion     (Blank rows = not tested)  Hip positions:  []  Neutral   [x]  Abducted   []  Adducted  []  Subluxed   []  Dislocated   []  Fixed   [  x] Tendency away from neutral []  Flexible []  Self-correction []  External correction   Hip Windswept:[x]  Neutral  []  Right    []  Left  []  Subluxed   []  Dislocated   []  Fixed   []  Tendency away from neutral []  Flexible []  Self-correction []  External correction  LE  Tone: [x]  Normal []  Low tone []  Spasticity []  Flaccid []  Dystonia []  Rocks/Extends at hip []  Thrust into knee extension []  Pushes legs downward into footrest  LE Edema: [x]  1+ (Barely detectable impression when finger is pressed into skin) []  2+ (slight indentation. 15 seconds to rebound) []  3+ (deeper indentation. 30 seconds to rebound) []  4+ (>30 seconds to rebound)  UE Measurements:  UPPER EXTREMITY ROM:   Active ROM Right 05/21/2024 Left 05/21/2024  Shoulder flexion 90 deg; pain 88 deg; pain  Shoulder abduction 92 deg; pain 90 deg; pain  Shoulder adduction    Elbow flexion WNL WNL  Elbow extension WNL WNL  Wrist flexion    Wrist extension    (Blank rows = not tested)  UPPER EXTREMITY MMT:  MMT Right 05/21/2024 Left 05/21/2024  Shoulder flexion    Shoulder abduction    Shoulder adduction    Elbow flexion    Elbow extension    Wrist flexion    Wrist extension    Pinch strength    Grip strength Soft, symmetrical; difficulty w/ 1st and 2nd rays fully grasping (delayed) Soft, symmetrical  (Blank rows = not tested)  Shoulder Posture:  Right Tendency towards Left  []   Functional []    [x]   Elevation [x]    []   Depression []    [x]   Protraction [x]    []   Retraction []    []   Internal rotation []    []   External rotation []    []   Subluxed []     UE Tone: [x]  Normal []  Flaccid []  Low tone []  Spasticity  []  Dystonia []  Other:   Wrist/Hand: Handedness: [x]  Right   []  Left   []  NA: Comments:  Right  Left  []   WNL []    []   Limitations []    []   Contractures []    []   Fisting []    []   Tremors []    [x]   Weak grasp [x]    [x]   Poor dexterity [x]    []   Hand movement non functional []    []   Paralysis []         MOBILITY BASE RECOMMENDATIONS and JUSTIFICATION:  MOBILITY BASE  JUSTIFICATION   Manufacturer:   Games Developer:              Electrical Engineer                Color:  Seat Width:  20 inch Seat Depth 18 inch   []  Manual mobility base (continue below)   []   Scooter/POV  [x]  Power mobility base   Number of hours per day spent in above selected mobility base:  12 hours/day  Typical daily mobility base use Schedule: Patient will utilize his power wheelchair to perform all of his functional mobility in the home/home-like environments. It will allow him to travel between rooms of his home/home-like environment safely and efficiently in order to complete all of his MRADLs. Pt is able to perform squat pivot transfers mod I with his wheelchair and secondary surface for support. Due to impaired endurance, chronic pain, and cardiac history he is also unable to safely and efficiently propel himself in a manual wheelchair.    [x]  is not a safe,  functional ambulator  [x]  limitation prevents from completing a MRADL(s) within a reasonable time frame    [x]  limitation places at high risk of morbidity or mortality secondary to  the attempts to perform a    MRADL(s)  []  limitation prevents accomplishing a MRADL(s) entirely  [x]  provide independent mobility  [x]  equipment is a lifetime medical need  [x]  walker or cane inadequate  [x]  any type manual wheelchair      inadequate  [x]  scooter/POV inadequate      []  requires dependent mobility           POWER MOBILITY      []  Scooter/POV    []  can safely operate   []  can safely transfer   []  has adequate trunk stability   []  cannot functionally propel  manual wheelchair    [x]  Power mobility base    [x]  non-ambulatory   [x]  cannot functionally propel manual wheelchair   [x]  cannot functionally and safely      operate scooter/POV  [x]  can safely operate power       wheelchair  [x]  home is accessible  [x]  willing to use power wheelchair     Tilt  []  Powered tilt on powered chair  []  Powered tilt on manual chair  []  Manual tilt on manual chair Comments:  []  change position for pressure      []  elief/cannot weight shift   []  change position against      gravitational force on head and      shoulders   []   decrease pain  []  blood pressure management   []  control autonomic dysreflexia  []  decrease respiratory distress  []  management of spasticity  []  management of low tone  []  facilitate postural control   []  rest periods   []  control edema  []  increase sitting tolerance   []  aid with transfers     Recline   []  Power recline on power chair  []  Manual recline on manual chair  Comments:    []  intermittent catheterization  []  manage spasticity  []  accommodate femur to back angle  []  change position for pressure relief/cannot weight shift rhigh risk of pressure sore development  []  tilt alone does not accomplish     effective pressure relief, maximum pressure relief achieved at -      _______ degrees tilt   _______ degrees recline   []  difficult to transfer to and from bed []  rest periods and sleeping in chair  []  repositioning for transfers  []  bring to full recline for ADL care  []  clothing/diaper changes in chair  []  gravity PEG tube feeding  []  head positioning  []  decrease pain  []  blood pressure management   []  control autonomic dysreflexia  []  decrease respiratory distress  []  user on ventilator     Elevator on mobility base  [x]  Power wheelchair  []  Scooter  [x]  increase Indep in transfers   [x]  increase Indep in ADLs    [x]  bathroom function and safety  []  kitchen/cooking function and safety  []  shopping  [x]  raise height for communication at standing level  [x]  raise height for eye contact which reduces cervical neck strain and pain  []  drive at raised height for safety and navigating crowds  []  Other:   []  Vertical position system  (anterior tilt)     (Drive locks-out)    []  Stand       (Drive enabled)  []  independent weight bearing  []  decrease joint  contractures  []  decrease/manage spasticity  []  decrease/manage spasms  []  pressure distribution away from   scapula, sacrum, coccyx, and ischial tuberosity  []  increase digestion and elimination   []  access to  counters and cabinets  []  increase reach  []  increase interaction with others at eye level, reduces neck strain  []  increase performance of       MRADL(s)      Power elevating legrest    []  Center mount (Single) 85-170 degrees       []  Standard (Pair) 100-170 degrees  []  position legs at 90 degrees, not available with std power ELR  []  center mount tucks into chair to decrease turning radius in home, not available with std power ELR  []  provide change in position for LE  []  elevate legs during recline    []  maintain placement of feet on      footplate  []  decrease edema  []  improve circulation  []  actuator needed to elevate legrest  []  actuator needed to articulate legrest preventing knees from flexing  []  Increase ground clearance over      curbs  []   STD (pair) independently                     elevate legrest   POWER WHEELCHAIR CONTROLS      Controls/input device  []  Expandable  []  Non-expandable  []  Proportional  []  Right Hand []  Left Hand  []  Non-proportional/switches/head-array  []  Electrical/proximity         []   Mechanical      Manufacturer:___________________   Type:________________________ []  provides access for controlling wheelchair  []  programming for accurate control  []  progressive disease/changing condition  []  required for alternative drive      controls       []  lacks motor control to operate  proportional drive control  []  unable to understand proportional controls  []  limited movement/strength  []  extraneous movement / tremors / ataxic / spastic       []  Upgraded electronics controller/harness    []  Single power (tilt or recline)   []  Expandable    []  Non-expandable plus   []  Multi-power (tilt, recline, power legrest, power seat lift, vertical positioning system, stand)  []  allows input device to communicate with drive motors  []  harness provides necessary connections between the controller, input device, and seat functions     []  needed in order  to operate power seat functions through joystick/ input device  []  required for alternative drive controls     []  Enhanced display  []  required to connect all alternative drive controls   []  required for upgraded joystick      (lite-throw, heavy duty, micro)  []  Allows user to see in which mode and drive the wheelchair is set; necessary for alternate controls       []  Upgraded tracking electronics  []  correct tracking when on uneven surfaces makes switch driving more efficient and less fatiguing  []  increase safety when driving  []  increase ability to traverse thresholds    []  Safety / reset / mode switches     Type:    []  Used to change modes and stop the wheelchair when driving     []  Mount for joystick / input device/switches  []  swing away for access or transfers   []  attaches joystick / input device / switches to wheelchair   []  provides for consistent access  []  midline for optimal placement    []   Attendant controlled joystick plus     mount  []  safety  []  long distance driving  []  operation of seat functions  []  compliance with transportation regulations    [x]  Battery  [x]  required to power (power assist / scooter/ power wc / other): power wc  []  Power inverter (24V to 12V)  []  required for ventilator / respiratory equipment / other:     CHAIR OPTIONS MANUAL & POWER      Armrests   []  adjustable height []  removable  []  swing away []  fixed  []  flip back  []  reclining  []  full length pads []  desk []  tube arms []  gel pads  []  provide support with elbow at 90    []  remove/flip back/swing away for  transfers  []  provide support and positioning of upper body    []  allow to come closer to table top  []  remove for access to tables  []  provide support for w/c tray  []  change of height/angles for variable activities   []  Elbow support / Elbow stop  []  keep elbow positioned on arm pad  []  keep arms from falling off arm pad  during tilt and/or recline   Upper Extremity Support  []   Arm trough  []   R  []   L  Style:  []  swivel mount []  fixed mount   []  posterior hand support  []   tray  []  full tray  []  joystick cut out  []   R  []   L  Style:  []  decrease gravitational pull on      shoulders  []  provide support to increase UE  function  []  provide hand support in natural    position  []  position flaccid UE  []  decrease subluxation    []  decrease edema       []  manage spasticity   []  provide midline positioning  []  provide work surface  []  placement for AAC/ Computer/ EADL       Hangers/ Legrests   []  ______ degree  []  Elevating []  articulating  []  swing away []  fixed []  lift off  []  heavy duty  []  adjustable knee angle  []  adjustable calf panel   []  longer extension tube              []  provide LE support  []  maintain placement of feet on      footplate   []  accommodate lower leg length  []  accommodate to hamstring       tightness  []  enable transfers  []  provide change in position for LE's  []  elevate legs during recline    []  decrease edema  []  durability      Foot support   []  footplate []  R []  L []  flip up           []  Depth adjustable   []  angle adjustable  []  foot board/one piece    []  provide foot support  []  accommodate to ankle ROM  []  allow foot to go under wheelchair base  []  enable transfers     []  Shoe holders  []  position foot    []  decrease / manage spasticity  []  control position of LE  []  stability    []  safety     []  Ankle strap/heel      loops  []  support foot on foot support  []  decrease extraneous movement  []  provide input to heel   []  protect foot     []  Amputee adapter []   R  []  L     Style:                  Size:  []  Provide support for stump/residual extremity    []  Transportation tie-down  []  to provide crash tested tie-down brackets    []  Crutch/cane holder    []  O2 holder    []  IV hanger   []  Ventilator tray/mount    []  stabilize accessory on wheelchair       Component  Justification     []  Seat cushion       []  accommodate impaired sensation  []  decubitus ulcers present or history  []  unable to shift weight  []  increase pressure distribution  []  prevent pelvic extension  []  custom required "off-the-shelf"    seat cushion will not accommodate deformity  []  stabilize/promote pelvis alignment  []  stabilize/promote femur alignment  []  accommodate obliquity  []  accommodate multiple deformity  []  incontinent/accidents  []  low maintenance     []  seat mounts                 []  fixed []  removable  []  attach seat platform/cushion to wheelchair frame    []  Seat wedge    []  provide increased aggressiveness of seat shape to decrease sliding  down in the seat  []  accommodate ROM        []  Cover replacement   []  protect back or seat cushion  []  incontinent/accidents    []  Solid seat / insert    []  support cushion to prevent      hammocking  []  allows attachment of cushion to mobility base    []  Lateral pelvic/thigh/hip     support (Guides)     []  decrease abduction  []  accommodate pelvis  []  position upper legs  []  accommodate spasticity  []  removable for transfers     []  Lateral pelvic/thigh      supports mounts  []  fixed   []  swing-away   []  removable  []  mounts lateral pelvic/thigh supports     []  mounts lateral pelvic/thigh supports swing-away or removable for transfers    []  Medial thigh support ([place]mel)  [] decrease adduction  [] accommodate ROM  []  remove for transfers   []  alignment      []  Medial thigh   []  fixed      support mounts      []  swing-away   []  removable  []  mounts medial thigh supports   []  Mounts medial supports swing- away or removable for transfers       Component  Justification   []  Back       []  provide posterior trunk support []  facilitate tone  []  provide lumbar/sacral support []  accommodate deformity  []  support trunk in midline   []  custom required "off-the-shelf" back support will not accommodate deformity   []  provide lateral trunk support []  accommodate or  decrease tone            []  Back mounts  []  fixed  []  removable  []  attach back rest/cushion to wheelchair frame   []  Lateral trunk      supports  []  R []  L  []  decrease lateral trunk leaning  []  accommodate asymmetry    []  contour for increased contact  []  safety    []  control of tone    []  Lateral trunk      supports mounts  []  fixed  []  swing-away   []  removable  []  mounts  lateral trunk supports     []  Mounts lateral trunk supports swing-away or removable for transfers   []  Anterior chest      strap, vest     []  decrease forward movement of shoulder  []  decrease forward movement of trunk  []  safety/stability  []  added abdominal support  []  trunk alignment  []  assistance with shoulder control   []  decrease shoulder elevation    []  Headrest      []  provide posterior head support  []  provide posterior neck support  []  provide lateral head support  []  provide anterior head support  []  support during tilt and recline  []  improve feeding     []  improve respiration  []  placement of switches  []  safety    []  accommodate ROM   []  accommodate tone  []  improve visual orientation   []  Headrest           []  fixed []  removable []  flip down      Mounting hardware   []  swing-away laterals/switches  []  mount headrest   []  mounts headrest flip down or  removable for transfers  []  mount headrest swing-away laterals   []  mount switches     []  Neck Support    []  decrease neck rotation  []  decrease forward neck flexion   Pelvic Positioner    []  std hip belt          []  padded hip belt  []  dual pull hip belt  []  four point hip belt  []  stabilize tone  []  decrease falling out of chair  []  prevent excessive extension  []  special pull angle to control      rotation  []  pad for protection over boney   prominence  []  promote comfort    []  Essential needs        bag/pouch   []  medicines []  special food rorthotics []  clothing changes  []  diapers  []  catheter/hygiene []  ostomy supplies   The  above equipment has a life- long use expectancy.  Growth and changes in medical and/or functional conditions would be the exceptions.   SUMMARY:  Why mobility device was selected; include why a lower level device is not appropriate: Patient requires use of a power wheelchair in order to perform safe and independent mobility in his home-like environment and in order to perform his MRADLs in a timely manner. Due to his impaired endurance and history of hypertension, paroxysmal atrial fibrillation, and atrial flutter w/ RVR in combination with chronic pain and bilateral below knee amputations he is unable to safely and efficiently propel himself in a manual wheelchair without putting significant strain on his shoulder joints and putting himself at risk for injury. Additionally, due to his history of chronic pain and bilateral below knee amputations w/ cardiac history impacting his LE edema and ability to don prosthetics he is unable to ambulate and has poor standing tolerance, therefore requiring power wheeled mobility.   Due to his bilateral below knee amputations, Mr. Hawkes requires a seat elevator component to his power wheeled mobility to allow for improved ADL participation and to accommodate for difficulty with functional transfers as well as allowing him the dignity of advocating for himself at eye level with his peers.  He would greatly benefit from this feature for improved hygiene and access to countertop surfaces for various ADLs.    ASSESSMENT:  CLINICAL IMPRESSION: Patient is a 63 y.o. male who was seen today for physical therapy evaluation and treatment  for power wheelchair.   CLINICAL DECISION MAKING: Stable/uncomplicated  EVALUATION COMPLEXITY: High                                   GOALS: One time visit. No goals established.    PLAN: PT FREQUENCY: one time visit    Daved KATHEE Bull, PT, DPT 05/21/2024, 11:50 AM    I concur with the above findings and  recommendations of the therapist:  Physician name printed:         Physician's signature:      Date:

## 2024-05-24 ENCOUNTER — Emergency Department (HOSPITAL_COMMUNITY)

## 2024-05-24 ENCOUNTER — Ambulatory Visit: Admitting: Physical Therapy

## 2024-05-24 ENCOUNTER — Encounter (HOSPITAL_COMMUNITY): Payer: Self-pay

## 2024-05-24 ENCOUNTER — Other Ambulatory Visit: Payer: Self-pay

## 2024-05-24 ENCOUNTER — Emergency Department (HOSPITAL_COMMUNITY)
Admission: EM | Admit: 2024-05-24 | Discharge: 2024-05-24 | Disposition: A | Discharge status: Home / Self Care | Attending: Emergency Medicine | Admitting: Emergency Medicine

## 2024-05-24 DIAGNOSIS — R509 Fever, unspecified: Secondary | ICD-10-CM | POA: Insufficient documentation

## 2024-05-24 DIAGNOSIS — R059 Cough, unspecified: Secondary | ICD-10-CM | POA: Insufficient documentation

## 2024-05-24 DIAGNOSIS — R1084 Generalized abdominal pain: Secondary | ICD-10-CM | POA: Insufficient documentation

## 2024-05-24 DIAGNOSIS — R161 Splenomegaly, not elsewhere classified: Secondary | ICD-10-CM | POA: Diagnosis not present

## 2024-05-24 DIAGNOSIS — R0989 Other specified symptoms and signs involving the circulatory and respiratory systems: Secondary | ICD-10-CM | POA: Insufficient documentation

## 2024-05-24 DIAGNOSIS — K5900 Constipation, unspecified: Secondary | ICD-10-CM | POA: Diagnosis not present

## 2024-05-24 DIAGNOSIS — K7030 Alcoholic cirrhosis of liver without ascites: Secondary | ICD-10-CM | POA: Diagnosis not present

## 2024-05-24 DIAGNOSIS — R109 Unspecified abdominal pain: Secondary | ICD-10-CM | POA: Diagnosis present

## 2024-05-24 DIAGNOSIS — F1010 Alcohol abuse, uncomplicated: Secondary | ICD-10-CM | POA: Insufficient documentation

## 2024-05-24 DIAGNOSIS — K766 Portal hypertension: Secondary | ICD-10-CM | POA: Insufficient documentation

## 2024-05-24 DIAGNOSIS — R058 Other specified cough: Secondary | ICD-10-CM | POA: Diagnosis not present

## 2024-05-24 DIAGNOSIS — I480 Paroxysmal atrial fibrillation: Secondary | ICD-10-CM | POA: Diagnosis not present

## 2024-05-24 DIAGNOSIS — M87851 Other osteonecrosis, right femur: Secondary | ICD-10-CM | POA: Diagnosis not present

## 2024-05-24 DIAGNOSIS — K449 Diaphragmatic hernia without obstruction or gangrene: Secondary | ICD-10-CM | POA: Diagnosis not present

## 2024-05-24 DIAGNOSIS — Z79899 Other long term (current) drug therapy: Secondary | ICD-10-CM | POA: Diagnosis not present

## 2024-05-24 DIAGNOSIS — M87852 Other osteonecrosis, left femur: Secondary | ICD-10-CM | POA: Insufficient documentation

## 2024-05-24 DIAGNOSIS — R112 Nausea with vomiting, unspecified: Secondary | ICD-10-CM | POA: Insufficient documentation

## 2024-05-24 DIAGNOSIS — F1410 Cocaine abuse, uncomplicated: Secondary | ICD-10-CM | POA: Insufficient documentation

## 2024-05-24 DIAGNOSIS — I700 Atherosclerosis of aorta: Secondary | ICD-10-CM | POA: Insufficient documentation

## 2024-05-24 DIAGNOSIS — I499 Cardiac arrhythmia, unspecified: Secondary | ICD-10-CM | POA: Diagnosis present

## 2024-05-24 LAB — LIPASE, BLOOD: Lipase: 30 U/L (ref 11–51)

## 2024-05-24 LAB — COMPREHENSIVE METABOLIC PANEL WITH GFR
ALT: 17 U/L (ref 0–44)
AST: 29 U/L (ref 15–41)
Albumin: 3.7 g/dL (ref 3.5–5.0)
Alkaline Phosphatase: 116 U/L (ref 38–126)
Anion gap: 15 (ref 5–15)
BUN: 9 mg/dL (ref 8–23)
CO2: 19 mmol/L — ABNORMAL LOW (ref 22–32)
Calcium: 9 mg/dL (ref 8.9–10.3)
Chloride: 105 mmol/L (ref 98–111)
Creatinine, Ser: 0.72 mg/dL (ref 0.61–1.24)
GFR, Estimated: >60 mL/min (ref >60)
Glucose, Bld: 113 mg/dL — ABNORMAL HIGH (ref 70–99)
Potassium: 3.6 mmol/L (ref 3.5–5.1)
Sodium: 139 mmol/L (ref 135–145)
Total Bilirubin: 1.5 mg/dL — ABNORMAL HIGH (ref 0.0–1.2)
Total Protein: 7.6 g/dL (ref 6.5–8.1)

## 2024-05-24 LAB — URINALYSIS, ROUTINE W REFLEX MICROSCOPIC
Bilirubin Urine: NEGATIVE
Glucose, UA: NEGATIVE mg/dL
Hgb urine dipstick: NEGATIVE
Ketones, ur: 5 mg/dL — AB
Leukocytes,Ua: NEGATIVE
Nitrite: NEGATIVE
Protein, ur: NEGATIVE mg/dL
Specific Gravity, Urine: 1.028 (ref 1.005–1.030)
pH: 7 (ref 5.0–8.0)

## 2024-05-24 LAB — RESP PANEL BY RT-PCR (RSV, FLU A&B, COVID)  RVPGX2
Influenza A by PCR: NEGATIVE
Influenza B by PCR: NEGATIVE
Resp Syncytial Virus by PCR: NEGATIVE
SARS Coronavirus 2 by RT PCR: NEGATIVE

## 2024-05-24 LAB — CBC
HCT: 40.3 % (ref 39.0–52.0)
Hemoglobin: 13.9 g/dL (ref 13.0–17.0)
MCH: 33.2 pg (ref 26.0–34.0)
MCHC: 34.5 g/dL (ref 30.0–36.0)
MCV: 96.2 fL (ref 80.0–100.0)
Platelets: 129 K/uL — ABNORMAL LOW (ref 150–400)
RBC: 4.19 MIL/uL — ABNORMAL LOW (ref 4.22–5.81)
RDW: 13.7 % (ref 11.5–15.5)
WBC: 7.9 K/uL (ref 4.0–10.5)
nRBC: 0 % (ref 0.0–0.2)

## 2024-05-24 LAB — MAGNESIUM: Magnesium: 1.4 mg/dL — ABNORMAL LOW (ref 1.7–2.4)

## 2024-05-24 LAB — CBG MONITORING, ED
Glucose-Capillary: 103 mg/dL — ABNORMAL HIGH (ref 70–99)
Glucose-Capillary: 108 mg/dL — ABNORMAL HIGH (ref 70–99)

## 2024-05-24 LAB — ETHANOL: Alcohol, Ethyl (B): <15 mg/dL (ref <15)

## 2024-05-24 MED ORDER — ONDANSETRON HCL 4 MG PO TABS: 4.0000 mg | ORAL_TABLET | Freq: Four times a day (QID) | ORAL | 0 refills | Status: DC

## 2024-05-24 MED ORDER — MAGNESIUM SULFATE 2 GM/50ML IV SOLN
2.0000 g | Freq: Once | INTRAVENOUS | Status: AC
  Administered 2024-05-24: 2 g via INTRAVENOUS
  Filled 2024-05-24: qty 50

## 2024-05-24 MED ORDER — METOCLOPRAMIDE HCL 5 MG/ML IJ SOLN
10.0000 mg | Freq: Once | INTRAMUSCULAR | Status: AC
  Administered 2024-05-24: 10 mg via INTRAVENOUS
  Filled 2024-05-24: qty 2

## 2024-05-24 MED ORDER — SODIUM CHLORIDE 0.9 % IV BOLUS
1000.0000 mL | Freq: Once | INTRAVENOUS | Status: AC
Start: 2024-05-24 — End: 2024-05-24
  Administered 2024-05-24: 1000 mL via INTRAVENOUS

## 2024-05-24 MED ORDER — IOHEXOL 350 MG/ML SOLN
75.0000 mL | Freq: Once | INTRAVENOUS | Status: AC | PRN
Start: 2024-05-24 — End: 2024-05-24
  Administered 2024-05-24: 75 mL via INTRAVENOUS

## 2024-05-24 MED ORDER — IPRATROPIUM-ALBUTEROL 0.5-2.5 (3) MG/3ML IN SOLN
3.0000 mL | Freq: Once | RESPIRATORY_TRACT | Status: AC
Start: 2024-05-24 — End: 2024-05-24
  Administered 2024-05-24: 3 mL via RESPIRATORY_TRACT
  Filled 2024-05-24: qty 3

## 2024-05-24 NOTE — ED Notes (Signed)
 Shift commander contacted to get in touch with crew that transported pt to hospital. Pt is wheelchair bound and wheelchair was left at [facility] where he was picked up. Library was contacted and states that there is not a wheelchair at the facility. They are going to look at the park nearby and will contact us  if found.

## 2024-05-24 NOTE — ED Provider Notes (Signed)
 [facility] EMERGENCY DEPARTMENT AT [facility] Provider Note   CSN: [id number] Arrival date & time: 05/24/24  9246     Patient presents with: Emesis   Cory Palmer is a 63 y.o. male patient with history of paroxysmal atrial fibrillation not on any anticoagulation, liver cirrhosis, substance abuse, alcohol abuse who presents to the emergency department today for further evaluation of nausea and vomiting.  This started yesterday.  Patient states that he had a softer bowel movement yesterday but not relieving.  He has been coughing as well.  Does report associated subjective fever without chills.  Chart review reveals patient does suffer from some cocaine abuse and alcohol abuse.  History of liver cirrhosis.    Emesis      Prior to Admission medications   Medication Sig Start Date End Date Taking? Authorizing Provider  ondansetron  (ZOFRAN ) 4 MG tablet Take 1 tablet (4 mg total) by mouth every 6 (six) hours. 05/24/24  Yes Theotis, Jamaria Amborn M, PA-C  cetirizine (ZYRTEC) 10 MG tablet Take 10 mg by mouth daily.    [provider]  QUEtiapine  (SEROQUEL ) 300 MG tablet Take 1 tablet (300 mg total) by mouth at bedtime. 09/06/23   Mannie Jerel PARAS, NP  traZODone  (DESYREL ) 150 MG tablet Take 150 mg by mouth at bedtime. 04/17/24   [provider]    Allergies: Atorvastatin calcium  and Gemfibrozil    Review of Systems  Gastrointestinal:  Positive for vomiting.  All other systems reviewed and are negative.   Updated Vital Signs BP (!) 116/101   Pulse 99   Temp 98.1 F (36.7 C) (Oral)   Resp (!) 21   Ht 5' 8 (1.727 m)   Wt 90.7 kg   SpO2 95%   BMI 30.41 kg/m   Physical Exam Vitals and nursing note reviewed.  Constitutional:      General: He is not in acute distress.    Appearance: Normal appearance.  HENT:     Head: Normocephalic and atraumatic.  Eyes:     General:        Right eye: No discharge.        Left eye: No discharge.  Cardiovascular:      Comments: Regular rate and rhythm.  S1/S2 are distinct without any evidence of murmur, rubs, or gallops.  Radial pulses are 2+ bilaterally.  Dorsalis pedis pulses are 2+ bilaterally.  No evidence of pedal edema. Pulmonary:     Breath sounds: Rhonchi present. No decreased breath sounds.  Abdominal:     General: Abdomen is flat. Bowel sounds are increased. There is no distension.     Palpations: Abdomen is soft.     Tenderness: There is generalized abdominal tenderness. There is no guarding or rebound.  Musculoskeletal:        General: Normal range of motion.     Cervical back: Neck supple.  Skin:    General: Skin is warm and dry.     Findings: No rash.  Neurological:     General: No focal deficit present.     Mental Status: He is alert.  Psychiatric:        Mood and Affect: Mood normal.        Behavior: Behavior normal.     (all labs ordered are listed, but only abnormal results are displayed) Labs Reviewed  COMPREHENSIVE METABOLIC PANEL WITH GFR - Abnormal; Notable for the following components:      Result Value   CO2 19 (*)  Glucose, Bld 113 (*)    Total Bilirubin 1.5 (*)    All other components within normal limits  CBC - Abnormal; Notable for the following components:   RBC 4.19 (*)    Platelets 129 (*)    All other components within normal limits  URINALYSIS, ROUTINE W REFLEX MICROSCOPIC - Abnormal; Notable for the following components:   Ketones, ur 5 (*)    All other components within normal limits  MAGNESIUM  - Abnormal; Notable for the following components:   Magnesium  1.4 (*)    All other components within normal limits  CBG MONITORING, ED - Abnormal; Notable for the following components:   Glucose-Capillary 103 (*)    All other components within normal limits  CBG MONITORING, ED - Abnormal; Notable for the following components:   Glucose-Capillary 108 (*)    All other components within normal limits  RESP PANEL BY RT-PCR (RSV, FLU A&B, COVID)  RVPGX2  LIPASE,  BLOOD  ETHANOL    EKG: EKG Interpretation Date/Time:  Friday May 24 2024 08:34:59 EDT Ventricular Rate:  87 PR Interval:    QRS Duration:  104 QT Interval:  401 QTC Calculation: 483 R Axis:   31  Text Interpretation: Atrial fibrillation Borderline T abnormalities, anterior leads Borderline prolonged QT interval Confirmed by Pamella Sharper [phone]) on 05/24/2024 9:11:52 AM  Radiology: CT CHEST ABDOMEN PELVIS W CONTRAST Result Date: 05/24/2024 EXAM: CT CHEST, ABDOMEN AND PELVIS WITH CONTRAST 05/24/2024 09:54:22 AM TECHNIQUE: CT of the chest, abdomen and pelvis was performed with the administration of 75 mL of iohexol  (OMNIPAQUE ) 350 MG/ML injection. Multiplanar reformatted images are provided for review. Automated exposure control, iterative reconstruction, and/or weight based adjustment of the mA/kV was utilized to reduce the radiation dose to as low as reasonably achievable. COMPARISON: Chest radiograph of 05/04/2024, abdominal pelvic CT of 03/24/2022, and CTA chest of 12/13/2022. CLINICAL HISTORY: Abdominal pain, acute, nonlocalized. FINDINGS: CHEST: MEDIASTINUM AND LYMPH NODES: Bovine arch. Aortic atherosclerosis. Aortic valve calcification. Mild cardiomegaly. Multivessel coronary artery disease calcification. Periaesophageal varices. No central pulmonary embolism, on this non-dedicated exam. The central airways are clear. No mediastinal, hilar or axillary lymphadenopathy. LUNGS AND PLEURA: Similar right upper lobe peribronchovascular ground glass and subpleural bandlike opacities favoring postinfectious/inflammatory scarring. Right lower lobe subpleural heterogeneous ground glass, at the site of airspace disease on the prior exam and therefore likely also related to scarring. No pleural effusion or pneumothorax. ABDOMEN AND PELVIS: LIVER: Advanced cirrhosis with an irregular capsule and shrunken hepatic morphology. No suspicious liver lesion. Portal venous hypertension, with a recanalized  paraumbilical vein and patent portal and splenic veins. GALLBLADDER AND BILE DUCTS: Gallbladder is unremarkable. No biliary ductal dilatation. SPLEEN: Splenomegaly at 18 cm craniocaudal. PANCREAS: Mild pancreatic atrophy. ADRENAL GLANDS: No acute abnormality. KIDNEYS, URETERS AND BLADDER: Low density right renal lesions of up to 2.2 cm are most consistent with cysts and do not warrant specific imaging follow-up. Urinary bladder is unremarkable. GI AND BOWEL: Normal stomach, without wall thickening. Stool ball in the rectum of 7.7 cm. Large colonic stool burden throughout. Normal terminal ileum. Appendix not visualized. Normal small bowel. There is no bowel obstruction. REPRODUCTIVE ORGANS: No acute abnormality. PERITONEUM AND RETROPERITONEUM: No ascites. No free air. VASCULATURE: Aorta is normal in caliber. Aortic and branch vessel atherosclerosis. ABDOMINAL AND PELVIS LYMPH NODES: No lymphadenopathy. BONES AND SOFT TISSUES: Moderate bilateral gynecomastia. 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 Pars Defects with grade 1 L4-L5 anterolisthesis. Lower cervical spondylosis. IMPRESSION: 1. Colonic stool burden Suggests constipation or  even fecal impaction. No other explanation for pain. 2. Cirrhosis and portal venous hypertension with splenomegaly. 3. No acute process in the chest. Chronic right upper and left lower lobe scarring. 4. Bilateral femoral head avascular necrosis. 5. Incidental findings, including: Aortic atherosclerosis (icd10-i70.0) moderate hiatal hernia Electronically signed by: Rockey Kilts MD 05/24/2024 10:30 AM EDT RP Workstation: [id number]     Procedures   Medications Ordered in the ED  metoCLOPramide  (REGLAN ) injection 10 mg (10 mg Intravenous Given 05/24/24 0849)  sodium chloride  0.9 % bolus 1,000 mL (0 mLs Intravenous Stopped 05/24/24 1344)  iohexol  (OMNIPAQUE ) 350 MG/ML injection 75 mL (75 mLs Intravenous Contrast Given 05/24/24 0955)  magnesium  sulfate IVPB 2 g 50 mL  (0 g Intravenous Stopped 05/24/24 1350)  ipratropium-albuterol  (DUONEB) 0.5-2.5 (3) MG/3ML nebulizer solution 3 mL (3 mLs Nebulization Given 05/24/24 1318)    Clinical Course as of 05/24/24 1531  Fri May 24, 2024  0945 I was notified by CT that the patient was having a hacking cough.  Based on past medical history, the patient does have some alcohol abuse history.  Will add on a chest to the abdomen and pelvis to look for any signs of aspiration pneumonia. [CF]  1311 Patient has not had any vomiting since he has been here.  He is complaining of some epigastric and chest burning likely from vomiting.  Will plan to give him 1 DuoNeb and plan to discharge home.  Patient agreeable with plan.  I will also give him some Zofran  to go home with. [CF]    Clinical Course User Index [CF] Theotis Cameron HERO, PA-C    Medical Decision Making TEDDRICK MALLARI is a 63 y.o. male patient who presents to the emergency department today for further evaluation of nausea and vomiting.  Patient is in atrial fibrillation currently but is rate controlled.  Patient does not meet cardioversion criteria at this time.  Blood pressure looks good.  He is having some vomiting during exam and hacking cough.  Mild rhonchi heard during auscultation.  Patient does have a soft abdomen with some mild diffuse tenderness.  Increased bowel sounds.  Bowel obstruction certainly on the differential, viral gastroenteritis, appendicitis, diverticulitis.  Given the patient's age and symptoms, we will plan to get a CT abdomen pelvis and will add on a chest as well given the hacking cough to look for any signs of aspiration pneumonia.  Will give the patient some fluids and Zofran  as well.  No evidence of bowel obstruction or pneumonia on CT.  Patient's arrhythmia has resolved after fluids.  Again rate controlled.  There is some evidence of constipation.  Low suspicion for fecal impaction as the patient had a bowel movement yesterday.  Nor is he having  any rectal pain.  Patient not having any vomiting currently.  Tolerating p.o.  Will give him some Zofran  to go home with.  Patient can be discharged after his DuoNeb as he was having some mild wheezing.  Amount and/or Complexity of Data Reviewed Labs: ordered. Radiology: ordered.  Risk Prescription drug management.     Final diagnoses:  Nausea and vomiting, unspecified vomiting type    ED Discharge Orders          Ordered    ondansetron  (ZOFRAN ) 4 MG tablet  Every 6 hours        05/24/24 1312               Theotis Cameron [place], [place] 05/24/24 1531    Pamella Ozell LABOR,  DO 06/01/24 2302

## 2024-05-24 NOTE — ED Triage Notes (Signed)
 Pt arrived via EMS from [facility] for CC NV X 1 day. Pt is WC bound with prosthetic legs at baseline. Hx afib DM, bipolar. WC left at [facility], legs with pt

## 2024-05-24 NOTE — ED Notes (Addendum)
 BG 103

## 2024-05-24 NOTE — ED Notes (Signed)
 Pt discharged via W/C van.

## 2024-05-24 NOTE — ED Notes (Signed)
 Patient transported to CT

## 2024-05-24 NOTE — Discharge Instructions (Signed)
 I have given you some Zofran  which you can take for nausea.  Please drink plenty of fluids and get plenty of rest.  I would like for you to follow-up with your primary care doctor for further evaluation.  In return to the emergency department for any worsening symptoms.

## 2024-05-24 NOTE — Progress Notes (Signed)
 TOC consulted to assist c/locating pt's wheelchair which was left in the community when EMS picked him up and transportation at dc.   Pt states he was picked up near [facility] close to the hospital. CM called one of the SWs at [facility] on [address] who was familiar c/pt and one of his friends. Pt's friend knew of the wheelchair's location and stated he would be present c/the wheelchair when pt returns to [facility] at [employer]. [facility] [employer] notified of wc transport.  SW at [facility] updated on dc.

## 2024-05-27 ENCOUNTER — Ambulatory Visit: Admitting: Physical Therapy

## 2024-05-29 ENCOUNTER — Encounter: Admitting: Physical Therapy

## 2024-06-01 ENCOUNTER — Emergency Department (HOSPITAL_COMMUNITY)

## 2024-06-01 ENCOUNTER — Other Ambulatory Visit: Payer: Self-pay

## 2024-06-01 ENCOUNTER — Emergency Department (HOSPITAL_COMMUNITY)
Admission: EM | Admit: 2024-06-01 | Discharge: 2024-06-02 | Disposition: A | Discharge status: Home / Self Care | Attending: Emergency Medicine | Admitting: Emergency Medicine

## 2024-06-01 ENCOUNTER — Encounter (HOSPITAL_COMMUNITY): Payer: Self-pay

## 2024-06-01 DIAGNOSIS — Z89511 Acquired absence of right leg below knee: Secondary | ICD-10-CM | POA: Diagnosis not present

## 2024-06-01 DIAGNOSIS — Z5900 Homelessness unspecified: Secondary | ICD-10-CM | POA: Diagnosis not present

## 2024-06-01 DIAGNOSIS — Z993 Dependence on wheelchair: Secondary | ICD-10-CM | POA: Diagnosis not present

## 2024-06-01 DIAGNOSIS — I700 Atherosclerosis of aorta: Secondary | ICD-10-CM | POA: Insufficient documentation

## 2024-06-01 DIAGNOSIS — R000 Tachycardia, unspecified: Secondary | ICD-10-CM | POA: Insufficient documentation

## 2024-06-01 DIAGNOSIS — K2101 Gastro-esophageal reflux disease with esophagitis, with bleeding: Secondary | ICD-10-CM | POA: Diagnosis not present

## 2024-06-01 DIAGNOSIS — R079 Chest pain, unspecified: Secondary | ICD-10-CM | POA: Diagnosis present

## 2024-06-01 DIAGNOSIS — D696 Thrombocytopenia, unspecified: Secondary | ICD-10-CM | POA: Insufficient documentation

## 2024-06-01 DIAGNOSIS — K449 Diaphragmatic hernia without obstruction or gangrene: Secondary | ICD-10-CM | POA: Insufficient documentation

## 2024-06-01 DIAGNOSIS — R042 Hemoptysis: Secondary | ICD-10-CM | POA: Diagnosis present

## 2024-06-01 DIAGNOSIS — Z89512 Acquired absence of left leg below knee: Secondary | ICD-10-CM | POA: Insufficient documentation

## 2024-06-01 DIAGNOSIS — R0602 Shortness of breath: Secondary | ICD-10-CM | POA: Diagnosis present

## 2024-06-01 DIAGNOSIS — K7460 Unspecified cirrhosis of liver: Secondary | ICD-10-CM | POA: Diagnosis not present

## 2024-06-01 LAB — URINALYSIS, W/ REFLEX TO CULTURE (INFECTION SUSPECTED)
Bacteria, UA: NONE SEEN
Bilirubin Urine: NEGATIVE
Glucose, UA: NEGATIVE mg/dL
Hgb urine dipstick: NEGATIVE
Ketones, ur: NEGATIVE mg/dL
Leukocytes,Ua: NEGATIVE
Nitrite: NEGATIVE
Protein, ur: NEGATIVE mg/dL
Specific Gravity, Urine: 1.02 (ref 1.005–1.030)
pH: 8 (ref 5.0–8.0)

## 2024-06-01 LAB — CBC WITH DIFFERENTIAL/PLATELET
Abs Immature Granulocytes: 0.02 K/uL (ref 0.00–0.07)
Basophils Absolute: 0 K/uL (ref 0.0–0.1)
Basophils Relative: 0 %
Eosinophils Absolute: 0 K/uL (ref 0.0–0.5)
Eosinophils Relative: 0 %
HCT: 43.3 % (ref 39.0–52.0)
Hemoglobin: 14.9 g/dL (ref 13.0–17.0)
Immature Granulocytes: 0 %
Lymphocytes Relative: 7 %
Lymphs Abs: 0.5 K/uL — ABNORMAL LOW (ref 0.7–4.0)
MCH: 33 pg (ref 26.0–34.0)
MCHC: 34.4 g/dL (ref 30.0–36.0)
MCV: 95.8 fL (ref 80.0–100.0)
Monocytes Absolute: 0.3 K/uL (ref 0.1–1.0)
Monocytes Relative: 4 %
Neutro Abs: 7.3 K/uL (ref 1.7–7.7)
Neutrophils Relative %: 89 %
Platelets: 114 K/uL — ABNORMAL LOW (ref 150–400)
RBC: 4.52 MIL/uL (ref 4.22–5.81)
RDW: 13.2 % (ref 11.5–15.5)
WBC: 8.2 K/uL (ref 4.0–10.5)
nRBC: 0 % (ref 0.0–0.2)

## 2024-06-01 LAB — COMPREHENSIVE METABOLIC PANEL WITH GFR
ALT: 19 U/L (ref 0–44)
AST: 31 U/L (ref 15–41)
Albumin: 3.8 g/dL (ref 3.5–5.0)
Alkaline Phosphatase: 108 U/L (ref 38–126)
Anion gap: 11 (ref 5–15)
BUN: 10 mg/dL (ref 8–23)
CO2: 25 mmol/L (ref 22–32)
Calcium: 9.2 mg/dL (ref 8.9–10.3)
Chloride: 104 mmol/L (ref 98–111)
Creatinine, Ser: 0.76 mg/dL (ref 0.61–1.24)
GFR, Estimated: >60 mL/min (ref >60)
Glucose, Bld: 245 mg/dL — ABNORMAL HIGH (ref 70–99)
Potassium: 3.4 mmol/L — ABNORMAL LOW (ref 3.5–5.1)
Sodium: 140 mmol/L (ref 135–145)
Total Bilirubin: 1.5 mg/dL — ABNORMAL HIGH (ref 0.0–1.2)
Total Protein: 7.9 g/dL (ref 6.5–8.1)

## 2024-06-01 LAB — RESP PANEL BY RT-PCR (RSV, FLU A&B, COVID)  RVPGX2
Influenza A by PCR: NEGATIVE
Influenza B by PCR: NEGATIVE
Resp Syncytial Virus by PCR: NEGATIVE
SARS Coronavirus 2 by RT PCR: NEGATIVE

## 2024-06-01 LAB — I-STAT CG4 LACTIC ACID, ED
Lactic Acid, Venous: 2.7 mmol/L (ref 0.5–1.9)
Lactic Acid, Venous: 1.5 mmol/L (ref 0.5–1.9)

## 2024-06-01 LAB — PROTIME-INR
INR: 1.1 (ref 0.8–1.2)
Prothrombin Time: 15.2 s (ref 11.4–15.2)

## 2024-06-01 LAB — LIPASE, BLOOD: Lipase: 29 U/L (ref 11–51)

## 2024-06-01 LAB — BRAIN NATRIURETIC PEPTIDE: B Natriuretic Peptide: 26.6 pg/mL (ref 0.0–100.0)

## 2024-06-01 LAB — TROPONIN I (HIGH SENSITIVITY)
Troponin I (High Sensitivity): 4 ng/L (ref <18)
Troponin I (High Sensitivity): 3 ng/L (ref <18)

## 2024-06-01 MED ORDER — PANTOPRAZOLE SODIUM 40 MG PO TBEC: 40.0000 mg | DELAYED_RELEASE_TABLET | Freq: Every day | ORAL | 3 refills | Status: DC

## 2024-06-01 MED ORDER — QUETIAPINE FUMARATE 100 MG PO TABS
300.0000 mg | ORAL_TABLET | Freq: Every day | ORAL | Status: DC
  Administered 2024-06-01: 300 mg via ORAL
  Filled 2024-06-01: qty 3

## 2024-06-01 MED ORDER — ONDANSETRON HCL 4 MG PO TABS: 4.0000 mg | ORAL_TABLET | Freq: Three times a day (TID) | ORAL | Status: DC | PRN

## 2024-06-01 MED ORDER — FENTANYL CITRATE (PF) 50 MCG/ML IJ SOSY
50.0000 ug | PREFILLED_SYRINGE | Freq: Once | INTRAMUSCULAR | Status: AC
  Administered 2024-06-01: 50 ug via INTRAVENOUS
  Filled 2024-06-01: qty 1

## 2024-06-01 MED ORDER — PANTOPRAZOLE SODIUM 40 MG PO TBEC
40.0000 mg | DELAYED_RELEASE_TABLET | Freq: Two times a day (BID) | ORAL | Status: DC
  Administered 2024-06-02: 40 mg via ORAL
  Filled 2024-06-01: qty 1

## 2024-06-01 MED ORDER — PANTOPRAZOLE SODIUM 40 MG IV SOLR
40.0000 mg | Freq: Once | INTRAVENOUS | Status: AC
Start: 2024-06-01 — End: 2024-06-01
  Administered 2024-06-01: 40 mg via INTRAVENOUS
  Filled 2024-06-01: qty 10

## 2024-06-01 MED ORDER — SODIUM CHLORIDE 0.9 % IV BOLUS
1000.0000 mL | Freq: Once | INTRAVENOUS | Status: AC
  Administered 2024-06-01: 1000 mL via INTRAVENOUS

## 2024-06-01 MED ORDER — IOHEXOL 350 MG/ML SOLN
75.0000 mL | Freq: Once | INTRAVENOUS | Status: AC | PRN
  Administered 2024-06-01: 75 mL via INTRAVENOUS

## 2024-06-01 NOTE — ED Provider Notes (Signed)
  EMERGENCY DEPARTMENT AT [facility] Provider Note   CSN: [id number] Arrival date & time: 06/01/24  1221     Patient presents with: Chest Pain, Hemoptysis, and Emesis   Cory Palmer is a 63 y.o. male.  He is brought in by ambulance for cough and chest pain.  He said symptoms have been going on 2 weeks but worsened last night.  Said he is coughing up a bunch of stuff and sometimes some blood.  He said he is also been vomiting.  Does not know if he has had fevers.  He is homeless and is in a wheelchair.  Does not have his wheelchair with him.  Denies any diarrhea or urinary symptoms.  He does smoke, says infrequent alcohol and no current drugs.   The history is provided by the patient.  Chest Pain Pain location:  L chest and R chest Pain quality: aching   Pain severity:  Moderate Onset quality:  Gradual Progression:  Unchanged Chronicity:  New Relieved by:  None tried Worsened by:  Coughing Associated symptoms: abdominal pain, cough, nausea, shortness of breath and vomiting   Associated symptoms: no diaphoresis   Emesis Associated symptoms: abdominal pain, cough and myalgias        Prior to Admission medications   Medication Sig Start Date End Date Taking? Authorizing Provider  cetirizine (ZYRTEC) 10 MG tablet Take 10 mg by mouth daily.    [provider]  ondansetron  (ZOFRAN ) 4 MG tablet Take 1 tablet (4 mg total) by mouth every 6 (six) hours. 05/24/24   Theotis Peers M, PA-C  QUEtiapine  (SEROQUEL ) 300 MG tablet Take 1 tablet (300 mg total) by mouth at bedtime. 09/06/23   Mannie Jerel PARAS, NP  traZODone  (DESYREL ) 150 MG tablet Take 150 mg by mouth at bedtime. 04/17/24   [provider]    Allergies: Atorvastatin calcium  and Gemfibrozil    Review of Systems  Constitutional:  Negative for diaphoresis.  Respiratory:  Positive for cough and shortness of breath.   Cardiovascular:  Positive for chest pain.  Gastrointestinal:  Positive for  abdominal pain, nausea and vomiting.  Genitourinary:  Negative for dysuria.  Musculoskeletal:  Positive for myalgias.    Updated Vital Signs BP (!) 144/78   Pulse (!) 101   Temp 98.2 F (36.8 C) (Oral)   Resp (!) 24   Ht 6' (1.829 m)   Wt 90.7 kg   SpO2 95%   BMI 27.12 kg/m   Physical Exam Vitals and nursing note reviewed.  Constitutional:      General: He is not in acute distress.    Appearance: He is well-developed.  HENT:     Head: Normocephalic and atraumatic.  Eyes:     Conjunctiva/sclera: Conjunctivae normal.  Cardiovascular:     Rate and Rhythm: Tachycardia present. Rhythm irregular.     Heart sounds: Normal heart sounds. No murmur heard. Pulmonary:     Effort: Pulmonary effort is normal. No respiratory distress.     Breath sounds: Normal breath sounds.  Chest:     Chest wall: No crepitus.  Abdominal:     Palpations: Abdomen is soft.     Tenderness: There is no abdominal tenderness.  Musculoskeletal:        General: No swelling.     Cervical back: Neck supple.     Comments: Bilateral BKA with well-healed incisions  Skin:    General: Skin is warm and dry.     Capillary Refill: Capillary refill  takes less than 2 seconds.  Neurological:     General: No focal deficit present.     Mental Status: He is alert.     (all labs ordered are listed, but only abnormal results are displayed) Labs Reviewed  COMPREHENSIVE METABOLIC PANEL WITH GFR - Abnormal; Notable for the following components:      Result Value   Potassium 3.4 (*)    Glucose, Bld 245 (*)    Total Bilirubin 1.5 (*)    All other components within normal limits  CBC WITH DIFFERENTIAL/PLATELET - Abnormal; Notable for the following components:   Platelets 114 (*)    Lymphs Abs 0.5 (*)    All other components within normal limits  URINALYSIS, W/ REFLEX TO CULTURE (INFECTION SUSPECTED) - Abnormal; Notable for the following components:   APPearance HAZY (*)    All other components within normal limits   I-STAT CG4 LACTIC ACID, ED - Abnormal; Notable for the following components:   Lactic Acid, Venous 2.7 (*)    All other components within normal limits  CULTURE, BLOOD (ROUTINE X 2)  RESP PANEL BY RT-PCR (RSV, FLU A&B, COVID)  RVPGX2  CULTURE, BLOOD (ROUTINE X 2)  LIPASE, BLOOD  BRAIN NATRIURETIC PEPTIDE  PROTIME-INR  I-STAT CG4 LACTIC ACID, ED  TROPONIN I (HIGH SENSITIVITY)  TROPONIN I (HIGH SENSITIVITY)    EKG: EKG Interpretation Date/Time:  Saturday June 01 2024 12:28:50 EDT Ventricular Rate:  106 PR Interval:    QRS Duration:  102 QT Interval:  418 QTC Calculation: 556 R Axis:   44  Text Interpretation: Atrial fibrillation Nonspecific T abnormalities, diffuse leads Prolonged QT interval Artifact in lead(s) I II III aVR aVL aVF V1 V2 No significant change since prior 10/25 Confirmed by Towana Sharper [phone]) on 06/01/2024 12:29:48 PM  Radiology: CT ABDOMEN PELVIS W CONTRAST Result Date: 06/01/2024 CLINICAL DATA:  Abdominal pain, acute, nonlocalized. Chest pain for 2 weeks. Coughing up bright red blood. EXAM: CT ABDOMEN AND PELVIS WITH CONTRAST TECHNIQUE: Multidetector CT imaging of the abdomen and pelvis was performed using the standard protocol following bolus administration of intravenous contrast. RADIATION DOSE REDUCTION: This exam was performed according to the departmental dose-optimization program which includes automated exposure control, adjustment of the mA and/or kV according to patient size and/or use of iterative reconstruction technique. CONTRAST:  75mL OMNIPAQUE  IOHEXOL  350 MG/ML SOLN COMPARISON:  05/24/2024. FINDINGS: Lower chest: The heart is borderline enlarged and there is a trace pericardial effusion. Atelectasis is present at the lung bases. There is diffuse thickening of the walls of the esophagus with surrounding fluid in the posterior mediastinum. Multiple large esophageal varices are noted. No free air is seen. Hepatobiliary: Cirrhotic changes are noted  in the liver with heterogeneous enhancement. Hypodensity is noted in the left lobe the liver which is too small to further characterize. No biliary ductal dilatation. The gallbladder is without stones. Pancreas: Unremarkable. No pancreatic ductal dilatation or surrounding inflammatory changes. Spleen: The spleen is enlarged measuring 19.5 cm in craniocaudal dimension. Adrenals/Urinary Tract: The adrenal glands are within normal limits. The kidneys enhance symmetrically. Stable hypodensities are present in the right kidney, likely cysts. Subcentimeter hypodensities are noted in the left kidney which are too small to further characterize. No renal calculus or hydronephrosis bilaterally. The bladder is unremarkable. Stomach/Bowel: A small hiatal hernia is noted. The stomach is otherwise within normal limits. No bowel obstruction, free air, or pneumatosis is seen. A moderate amount of retained stool is present in the rectum. A few scattered  diverticula are noted along the colon without evidence of diverticulitis. Appendix appears normal. Vascular/Lymphatic: Aortic atherosclerosis. The portal vein, splenic vein, and superior mesenteric veins are patent. There is recannulization of the umbilical vein with multiple associated varices extending into the pelvis. Prominent lymph nodes are noted in the retroperitoneum in the upper abdomen which are likely reactive. Reproductive: Prostate is unremarkable. Other: No abdominopelvic ascites. Musculoskeletal: Degenerative changes are present in the thoracolumbar spine. There are bilateral pars defects at L4 with mild anterolisthesis at L4-L5. 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 IMPRESSION: 1. Small hiatal hernia with marked thickening of the walls of the distal esophagus with esophageal varices and a small amount of free fluid in the posterior mediastinum, possible esophagitis. 2. Moderate to large amount of retained stool in the colon and rectum,  possible constipation and/or fecal impaction. 3. Morphologic changes of cirrhosis and portal hypertension. 4. Aortic atherosclerosis. Electronically Signed   By: Leita Birmingham M.D.   On: 06/01/2024 15:57   CT Angio Chest PE W/Cm &/Or Wo Cm Result Date: 06/01/2024 EXAM: CTA of the Chest with contrast for PE 06/01/2024 03:30:03 PM TECHNIQUE: CTA of the chest was performed without and with the administration of 75 mL of iohexol  (OMNIPAQUE ) 350 MG/ML injection. Multiplanar reformatted images are provided for review. MIP images are provided for review. Automated exposure control, iterative reconstruction, and/or weight based adjustment of the mA/kV was utilized to reduce the radiation dose to as low as reasonably achievable. COMPARISON: 05/24/2024 CLINICAL HISTORY: Pulmonary embolism (PE) suspected, high prob. FINDINGS: PULMONARY ARTERIES: Pulmonary arteries are adequately opacified for evaluation. No pulmonary embolism. Main pulmonary artery is normal in caliber. MEDIASTINUM: Minimal coronary artery calcifications are noted. Severe diffuse esophageal wall thickening is noted, concern for esophagitis. There is no acute abnormality of the thoracic aorta. LYMPH NODES: No mediastinal, hilar or axillary lymphadenopathy. LUNGS AND PLEURA: Probable scarring is noted anteriorly in right upper lobe. Minimal bibasilar septum atelectasis is noted. No focal consolidation or pulmonary edema. No pleural effusion or pneumothorax. UPPER ABDOMEN: Hepatic cirrhosis is noted. Probable paraesophageal varices are noted distally. SOFT TISSUES AND BONES: No acute bone or soft tissue abnormality. IMPRESSION: 1. No pulmonary embolism. 2. Severe diffuse esophageal wall thickening, concerning for esophagitis. 3. 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 Electronically signed by: Lynwood Seip MD 06/01/2024 03:44 PM EDT RP Workstation: [id number]   DG Chest Port 1 View Result Date: 06/01/2024 EXAM: 1 VIEW(S) XRAY OF THE  CHEST 06/01/2024 01:18:00 PM COMPARISON: 05/04/2024 and 05/24/2024. CLINICAL HISTORY: cp sob cp sob FINDINGS: LUNGS AND PLEURA: Stable probable minimal bibasilar scarring. No focal pulmonary opacity. No pulmonary edema. No pleural effusion. No pneumothorax. HEART AND MEDIASTINUM: No acute abnormality of the cardiac and mediastinal silhouettes. BONES AND SOFT TISSUES: No acute osseous abnormality. IMPRESSION: 1. No acute cardiopulmonary process. 2. Stable probable minimal bibasilar scarring. Electronically signed by: Lynwood Seip MD 06/01/2024 01:33 PM EDT RP Workstation: [id number]     Procedures   Medications Ordered in the ED  QUEtiapine  (SEROQUEL ) tablet 300 mg (has no administration in time range)  pantoprazole  (PROTONIX ) EC tablet 40 mg (has no administration in time range)  ondansetron  (ZOFRAN ) tablet 4 mg (has no administration in time range)  fentaNYL  (SUBLIMAZE ) injection 50 mcg (50 mcg Intravenous Given 06/01/24 1253)  pantoprazole  (PROTONIX ) injection 40 mg (40 mg Intravenous Given 06/01/24 1254)  sodium chloride  0.9 % bolus 1,000 mL (0 mLs Intravenous Stopped 06/01/24 1752)  iohexol  (OMNIPAQUE ) 350 MG/ML injection 75 mL (75 mLs  Intravenous Contrast Given 06/01/24 1532)    Clinical Course as of 06/01/24 2006  Sat Jun 01, 2024  1336 Chest x-ray interpreted by me as no acute infiltrate.  Awaiting radiology reading. [MB]  1829 Patient's workup has been fairly unremarkable although then has significant esophageal thickening question esophagitis on CT.  He is no bleeding here.  He is tolerating p.o.  He is wheelchair-bound and it is not clear whether he has his wheelchair available to him.  Social work knows him well and states he usually leaves it at [facility].  It sounds like it is reasonable to keep him here overnight until we can make sure that he has his wheelchair available before we discharged him. [MB]    Clinical Course User Index [MB] Towana Ozell BROCKS, MD                                  Medical Decision Making Amount and/or Complexity of Data Reviewed Labs: ordered. Radiology: ordered.  Risk Prescription drug management.   This patient complains of pain in his chest and abdomen, coughing or vomiting blood; this involves an extensive number of treatment Options and is a complaint that carries with it a high risk of complications and morbidity. The differential includes hemoptysis, hematemesis, peptic ulcer disease, pneumonia, PE  I ordered, reviewed and interpreted labs, which included CBC with chronically low platelets, chemistries unremarkable, COVID and flu negative, lactate initially elevated and cleared, troponins flat I ordered medication IV PPI IV fluids and reviewed PMP when indicated. I ordered imaging studies which included chest x-ray, CT angio chest, CT abdomen and pelvis and I independently    visualized and interpreted imaging which showed esophageal thickening, constipation Additional history obtained from EMS Previous records obtained and reviewed in epic including frequent ED visits I consulted social work and discussed lab and imaging findings and discussed disposition.  Cardiac monitoring reviewed, sinus rhythm Social determinants considered, multiple barriers including food housing transportation Critical Interventions: None  After the interventions stated above, I reevaluated the patient and found patient to be feeling more comfortable tolerating p.o. in no distress Admission and further testing considered, no indications for admission.  However  unable to safely return patient back to his wheelchair.  Will keep till morning and get social work help      Final diagnoses:  Gastroesophageal reflux disease with esophagitis and hemorrhage  Nonspecific chest pain    ED Discharge Orders          Ordered    pantoprazole  (PROTONIX ) 40 MG tablet  Daily        06/01/24 1952               Towana Ozell BROCKS, MD 06/01/24 2010

## 2024-06-01 NOTE — ED Notes (Signed)
 Assuming pt care, pt bib ems for chest pain and vomiting on street, pt aaox3 disoriented to time, mae, skin warm/dry, pt reports generalized pain tolerable at this time. Pt reports med hx. Bipolar disorder. Resting in bed, call bell within reach

## 2024-06-01 NOTE — ED Triage Notes (Signed)
 Pt BIB [facility] c/o chest pain x 2 weeks. Last night, pt reports coughing up bright red blood and EMS noted a pile of blood beside where he was sleeping. Pt continued to vomit dark coffee-ground emesis x 2 with EMS. Pt vitally stable and A/Ox4 on arrival.   148/82 102 HR Afib w/ PMHx 97% on RA 17 RR CBG 252

## 2024-06-02 NOTE — Care Management (Addendum)
 Library was closed at 6 pm last evening, this is where his wheelchair is located. Today it opens at 2P according to the website, there is no number listed to double check this. Will  Send DASH to collect wheelchair and bring it to hospital.for the paitent

## 2024-06-02 NOTE — ED Provider Notes (Addendum)
 Emergency Medicine Observation Re-evaluation Note  Cory Palmer is a 63 y.o. male, seen on rounds today.  Pt initially presented to the ED for complaints of Chest Pain, Hemoptysis, and Emesis Currently, the patient is resting quietly.  Physical Exam  BP (!) 112/54   Pulse 79   Temp 98 F (36.7 C)   Resp 20   Ht 6' (1.829 m)   Wt 90.7 kg   SpO2 96%   BMI 27.12 kg/m  Physical Exam General: No acute distress Cardiac: Well-perfused Lungs: Nonlabored Psych: Calm  ED Course / MDM  EKG:EKG Interpretation Date/Time:  Saturday June 01 2024 12:28:50 EDT Ventricular Rate:  106 PR Interval:    QRS Duration:  102 QT Interval:  418 QTC Calculation: 556 R Axis:   44  Text Interpretation: Atrial fibrillation Nonspecific T abnormalities, diffuse leads Prolonged QT interval Artifact in lead(s) I II III aVR aVL aVF V1 V2 No significant change since prior 10/25 Confirmed by Towana Sharper [phone]) on 06/01/2024 12:29:48 PM  I have reviewed the labs performed to date as well as medications administered while in observation.  Recent changes in the last 24 hours include medical evaluation.  Plan  Current plan is for social work working on getting the patient back to his wheelchair.  2 PM.  Social work said Cory Palmer is coming to pick patient up.  Wheelchair is at [facility].  They will deliver him there.  Towana Sharper BROCKS, MD 06/02/24 1048    Towana Sharper BROCKS, MD 06/02/24 1409

## 2024-06-02 NOTE — Care Management (Signed)
 Spoke with patient and provider. Called [facility] wheelchair transport to make sure they are running today and could transport patient. Met with patietn at bedside. We called the [employer] and spoke to [place]. They have the patients wheelchair there and security can get it from social work office.   Patients caregiver called. He states she may have found an apartment for him. Scripts are ok to go to [facility] on [address].   [facility] called back and will be enroute. Will call this RNCM when he is 15 minutes away Patient is discharged.

## 2024-06-02 NOTE — Progress Notes (Signed)
 CSW spoke with patient who stated he isn't sure if his wheelchair is outside or inside of [employer]. The [employer] states its open today from 2-6 PM. ICM will contact the library when it opens to verify if patients wheelchair is there. Transportation will be provided for patient to get back to his wheelchair. Patient also states he has a caregiver now [place], [phone]. Patient did state that [facility]ice is helping him get an apartment. CSW left an VM with patients caregiver.

## 2024-06-03 ENCOUNTER — Encounter: Admitting: Physical Therapy

## 2024-06-05 ENCOUNTER — Encounter: Admitting: Physical Therapy

## 2024-06-06 LAB — CULTURE, BLOOD (ROUTINE X 2)
Culture: NO GROWTH
Culture: NO GROWTH

## 2024-06-12 ENCOUNTER — Encounter: Admitting: Physical Therapy

## 2024-06-14 ENCOUNTER — Encounter: Admitting: Physical Therapy

## 2024-06-17 ENCOUNTER — Encounter: Admitting: Physical Therapy

## 2024-06-20 ENCOUNTER — Encounter: Admitting: Physical Therapy

## 2024-07-05 ENCOUNTER — Emergency Department (HOSPITAL_COMMUNITY)
Admission: EM | Admit: 2024-07-05 | Discharge: 2024-07-05 | Disposition: A | Discharge status: Home / Self Care | Attending: Emergency Medicine | Admitting: Emergency Medicine

## 2024-07-05 ENCOUNTER — Other Ambulatory Visit: Payer: Self-pay

## 2024-07-05 ENCOUNTER — Encounter (HOSPITAL_COMMUNITY): Payer: Self-pay

## 2024-07-05 DIAGNOSIS — S80812A Abrasion, left lower leg, initial encounter: Secondary | ICD-10-CM | POA: Insufficient documentation

## 2024-07-05 DIAGNOSIS — Z79899 Other long term (current) drug therapy: Secondary | ICD-10-CM | POA: Insufficient documentation

## 2024-07-05 DIAGNOSIS — S80811A Abrasion, right lower leg, initial encounter: Secondary | ICD-10-CM | POA: Insufficient documentation

## 2024-07-05 DIAGNOSIS — W050XXA Fall from non-moving wheelchair, initial encounter: Secondary | ICD-10-CM | POA: Insufficient documentation

## 2024-07-05 DIAGNOSIS — T148XXA Other injury of unspecified body region, initial encounter: Secondary | ICD-10-CM

## 2024-07-05 HISTORY — DX: Cerebral infarction, unspecified: I63.9

## 2024-07-05 LAB — COMPREHENSIVE METABOLIC PANEL WITH GFR
ALT: 18 U/L (ref 0–44)
AST: 27 U/L (ref 15–41)
Albumin: 3.2 g/dL — ABNORMAL LOW (ref 3.5–5.0)
Alkaline Phosphatase: 113 U/L (ref 38–126)
Anion gap: 10 (ref 5–15)
BUN: 10 mg/dL (ref 8–23)
CO2: 20 mmol/L — ABNORMAL LOW (ref 22–32)
Calcium: 8.3 mg/dL — ABNORMAL LOW (ref 8.9–10.3)
Chloride: 106 mmol/L (ref 98–111)
Creatinine, Ser: 0.74 mg/dL (ref 0.61–1.24)
GFR, Estimated: >60 mL/min (ref >60)
Glucose, Bld: 216 mg/dL — ABNORMAL HIGH (ref 70–99)
Potassium: 3.6 mmol/L (ref 3.5–5.1)
Sodium: 136 mmol/L (ref 135–145)
Total Bilirubin: 1.6 mg/dL — ABNORMAL HIGH (ref 0.0–1.2)
Total Protein: 6.5 g/dL (ref 6.5–8.1)

## 2024-07-05 LAB — CBC
HCT: 36 % — ABNORMAL LOW (ref 39.0–52.0)
Hemoglobin: 12.2 g/dL — ABNORMAL LOW (ref 13.0–17.0)
MCH: 33 pg (ref 26.0–34.0)
MCHC: 33.9 g/dL (ref 30.0–36.0)
MCV: 97.3 fL (ref 80.0–100.0)
Platelets: 103 K/uL — ABNORMAL LOW (ref 150–400)
RBC: 3.7 MIL/uL — ABNORMAL LOW (ref 4.22–5.81)
RDW: 14.2 % (ref 11.5–15.5)
WBC: 7.7 K/uL (ref 4.0–10.5)
nRBC: 0 % (ref 0.0–0.2)

## 2024-07-05 LAB — RAPID URINE DRUG SCREEN, HOSP PERFORMED
Amphetamines: NOT DETECTED
Barbiturates: NOT DETECTED
Benzodiazepines: NOT DETECTED
Cocaine: POSITIVE — AB
Opiates: NOT DETECTED
Tetrahydrocannabinol: NOT DETECTED

## 2024-07-05 LAB — CBG MONITORING, ED: Glucose-Capillary: 194 mg/dL — ABNORMAL HIGH (ref 70–99)

## 2024-07-05 LAB — ETHANOL: Alcohol, Ethyl (B): <15 mg/dL (ref <15)

## 2024-07-05 MED ORDER — DOXYCYCLINE HYCLATE 100 MG PO CAPS: 100.0000 mg | ORAL_CAPSULE | Freq: Two times a day (BID) | ORAL | 0 refills | Status: DC

## 2024-07-05 MED ORDER — DOXYCYCLINE HYCLATE 100 MG PO TABS
100.0000 mg | ORAL_TABLET | Freq: Once | ORAL | Status: AC
  Administered 2024-07-05: 100 mg via ORAL
  Filled 2024-07-05: qty 1

## 2024-07-05 NOTE — ED Triage Notes (Addendum)
 Pt BIB [facility] from [facility]. Pt c/o abrasions to bilateral lower extremities that have been present for 3 weeks. Pt reports that his wheel chair wheels get stuck a lot and he falls out. The abrasions have some localized erythema surrounding them. Pt with bilateral BKA.   During screening questions pt states he has had SI with thoughts of using pills or a pistol. Pt states he is homeless so he has those thoughts on occasion. He denies intent and then he recanted his plan stating  I was just bull shitting with you.   EMS Vitals  144/76 HR 118 SpO2 98%

## 2024-07-05 NOTE — ED Provider Notes (Signed)
  EMERGENCY DEPARTMENT AT [facility] Provider Note   CSN: [id number] Arrival date & time: 07/05/24  1801     Patient presents with: No chief complaint on file.   Cory Palmer is a 63 y.o. male.   63 yo M with a chief complaints of pain to his left leg.  He said that he has had falls out of his wheelchair because it does not work properly.  He is currently trying to get a motorized wheelchair.  He was worried about one of the breaks of skin in his legs and so got EMS to take him here for further evaluation.        Prior to Admission medications   Medication Sig Start Date End Date Taking? Authorizing Provider  doxycycline  (VIBRAMYCIN ) 100 MG capsule Take 1 capsule (100 mg total) by mouth 2 (two) times daily. One po bid x 7 days 07/05/24  Yes Emil Share, DO  ondansetron  (ZOFRAN ) 4 MG tablet Take 1 tablet (4 mg total) by mouth every 6 (six) hours. Patient not taking: Reported on 06/01/2024 05/24/24   Theotis Cameron HERO, PA-C  pantoprazole  (PROTONIX ) 40 MG tablet Take 1 tablet (40 mg total) by mouth daily. 06/01/24   Towana Ozell BROCKS, MD  QUEtiapine  (SEROQUEL ) 300 MG tablet Take 1 tablet (300 mg total) by mouth at bedtime. 09/06/23   Mannie Jerel PARAS, NP  traZODone  (DESYREL ) 150 MG tablet Take 150 mg by mouth at bedtime. 04/17/24   [provider]    Allergies: Atorvastatin calcium  and Gemfibrozil    Review of Systems  Updated Vital Signs BP (!) 146/94 (BP Location: Left Arm)   Pulse 98   Temp 98.9 F (37.2 C) (Oral)   Resp 16   Ht 6' (1.829 m)   Wt 85.3 kg   SpO2 100%   BMI 25.50 kg/m   Physical Exam Vitals and nursing note reviewed.  Constitutional:      Appearance: He is well-developed.  HENT:     Head: Normocephalic and atraumatic.  Eyes:     Pupils: Pupils are equal, round, and reactive to light.  Neck:     Vascular: No JVD.  Cardiovascular:     Rate and Rhythm: Normal rate and regular rhythm.     Heart sounds: No murmur heard.     No friction rub. No gallop.  Pulmonary:     Effort: No respiratory distress.     Breath sounds: No wheezing.  Abdominal:     General: There is no distension.     Tenderness: There is no abdominal tenderness. There is no guarding or rebound.  Musculoskeletal:        General: Normal range of motion.     Cervical back: Normal range of motion and neck supple.     Comments: Bilateral BKA.  He has multiple areas of abraded tissue of different stages of healing.  He is particularly concerned about 1 on the left leg.  There is no surrounding erythema no drainage no fluctuance.  Skin:    Coloration: Skin is not pale.     Findings: No rash.  Neurological:     Mental Status: He is alert and oriented to person, place, and time.  Psychiatric:        Behavior: Behavior normal.     (all labs ordered are listed, but only abnormal results are displayed) Labs Reviewed  COMPREHENSIVE METABOLIC PANEL WITH GFR - Abnormal; Notable for the following components:  Result Value   CO2 20 (*)    Glucose, Bld 216 (*)    Calcium  8.3 (*)    Albumin 3.2 (*)    Total Bilirubin 1.6 (*)    All other components within normal limits  CBC - Abnormal; Notable for the following components:   RBC 3.70 (*)    Hemoglobin 12.2 (*)    HCT 36.0 (*)    Platelets 103 (*)    All other components within normal limits  RAPID URINE DRUG SCREEN, HOSP PERFORMED - Abnormal; Notable for the following components:   Cocaine POSITIVE (*)    All other components within normal limits  CBG MONITORING, ED - Abnormal; Notable for the following components:   Glucose-Capillary 194 (*)    All other components within normal limits  ETHANOL    EKG: None  Radiology: No results found.   Procedures   Medications Ordered in the ED  doxycycline  (VIBRA -TABS) tablet 100 mg (has no administration in time range)                                    Medical Decision Making Amount and/or Complexity of Data Reviewed Labs:  ordered.  Risk Prescription drug management.   63 yo M with multiple abrasions to his legs.  He has had multiple falls out of his wheelchair.  He initially had mentioned to the triage nurse that he was planning on killing himself.  He tells me he was not being serious.  Says he has no plan to hurt himself or anybody else.  He would like to go home at this time.  Will start him on oral antibiotics.  Lab work without acute anemia.  No significant electrolyte abnormalities.  Cocaine positive.  8:00 PM:  I have discussed the diagnosis/risks/treatment options with the patient.  Evaluation and diagnostic testing in the emergency department does not suggest an emergent condition requiring admission or immediate intervention beyond what has been performed at this time.  They will follow up with PCP. We also discussed returning to the ED immediately if new or worsening sx occur. We discussed the sx which are most concerning (e.g., sudden worsening pain, fever, inability to tolerate by mouth) that necessitate immediate return. Medications administered to the patient during their visit and any new prescriptions provided to the patient are listed below.  Medications given during this visit Medications  doxycycline  (VIBRA -TABS) tablet 100 mg (has no administration in time range)     The patient appears reasonably screen and/or stabilized for discharge and I doubt any other medical condition or other [facility] requiring further screening, evaluation, or treatment in the ED at this time prior to discharge.       Final diagnoses:  Abrasion    ED Discharge Orders          Ordered    doxycycline  (VIBRAMYCIN ) 100 MG capsule  2 times daily        07/05/24 1958               Emil Share, DO 07/05/24 2001

## 2024-07-05 NOTE — Discharge Instructions (Signed)
Follow up with your doctor.  Return for rapid spreading redness or fever.

## 2024-07-08 ENCOUNTER — Other Ambulatory Visit: Payer: Self-pay

## 2024-07-08 ENCOUNTER — Emergency Department (HOSPITAL_COMMUNITY)

## 2024-07-08 ENCOUNTER — Observation Stay (HOSPITAL_COMMUNITY)
Admission: EM | Admit: 2024-07-08 | Discharge: 2024-07-10 | DRG: 381 | Disposition: A | Attending: Emergency Medicine | Admitting: Emergency Medicine

## 2024-07-08 ENCOUNTER — Encounter (HOSPITAL_COMMUNITY): Payer: Self-pay

## 2024-07-08 DIAGNOSIS — I4891 Unspecified atrial fibrillation: Secondary | ICD-10-CM

## 2024-07-08 DIAGNOSIS — K922 Gastrointestinal hemorrhage, unspecified: Secondary | ICD-10-CM

## 2024-07-08 HISTORY — DX: Gastrointestinal hemorrhage, unspecified: K92.2

## 2024-07-08 LAB — CBC WITH DIFFERENTIAL/PLATELET
Abs Immature Granulocytes: 0.16 K/uL — ABNORMAL HIGH (ref 0.00–0.07)
Basophils Absolute: 0 K/uL (ref 0.0–0.1)
Basophils Relative: 0 %
Eosinophils Absolute: 0.2 K/uL (ref 0.0–0.5)
Eosinophils Relative: 1 %
HCT: 35.8 % — ABNORMAL LOW (ref 39.0–52.0)
Hemoglobin: 12.4 g/dL — ABNORMAL LOW (ref 13.0–17.0)
Immature Granulocytes: 1 %
Lymphocytes Relative: 9 %
Lymphs Abs: 1.2 K/uL (ref 0.7–4.0)
MCH: 33.4 pg (ref 26.0–34.0)
MCHC: 34.6 g/dL (ref 30.0–36.0)
MCV: 96.5 fL (ref 80.0–100.0)
Monocytes Absolute: 0.9 K/uL (ref 0.1–1.0)
Monocytes Relative: 7 %
Neutro Abs: 11 K/uL — ABNORMAL HIGH (ref 1.7–7.7)
Neutrophils Relative %: 82 %
Platelets: 120 K/uL — ABNORMAL LOW (ref 150–400)
RBC: 3.71 MIL/uL — ABNORMAL LOW (ref 4.22–5.81)
RDW: 14.1 % (ref 11.5–15.5)
WBC: 13.5 K/uL — ABNORMAL HIGH (ref 4.0–10.5)
nRBC: 0 % (ref 0.0–0.2)

## 2024-07-08 LAB — COMPREHENSIVE METABOLIC PANEL WITH GFR
ALT: 12 U/L (ref 0–44)
AST: 20 U/L (ref 15–41)
Albumin: 3.6 g/dL (ref 3.5–5.0)
Alkaline Phosphatase: 121 U/L (ref 38–126)
Anion gap: 10 (ref 5–15)
BUN: 17 mg/dL (ref 8–23)
CO2: 23 mmol/L (ref 22–32)
Calcium: 8.7 mg/dL — ABNORMAL LOW (ref 8.9–10.3)
Chloride: 104 mmol/L (ref 98–111)
Creatinine, Ser: 0.63 mg/dL (ref 0.61–1.24)
GFR, Estimated: >60 mL/min (ref >60)
Glucose, Bld: 160 mg/dL — ABNORMAL HIGH (ref 70–99)
Potassium: 3.5 mmol/L (ref 3.5–5.1)
Sodium: 137 mmol/L (ref 135–145)
Total Bilirubin: 1.9 mg/dL — ABNORMAL HIGH (ref 0.0–1.2)
Total Protein: 6.2 g/dL — ABNORMAL LOW (ref 6.5–8.1)

## 2024-07-08 LAB — TYPE AND SCREEN
ABO/RH(D): O POS
Antibody Screen: NEGATIVE

## 2024-07-08 LAB — HEMOGLOBIN AND HEMATOCRIT, BLOOD
HCT: 31.8 % — ABNORMAL LOW (ref 39.0–52.0)
HCT: 29.7 % — ABNORMAL LOW (ref 39.0–52.0)
Hemoglobin: 10.9 g/dL — ABNORMAL LOW (ref 13.0–17.0)
Hemoglobin: 10.3 g/dL — ABNORMAL LOW (ref 13.0–17.0)

## 2024-07-08 LAB — POC OCCULT BLOOD, ED: Fecal Occult Bld: NEGATIVE

## 2024-07-08 LAB — TROPONIN T, HIGH SENSITIVITY
Troponin T High Sensitivity: <15 ng/L (ref 0–19)
Troponin T High Sensitivity: <15 ng/L (ref 0–19)

## 2024-07-08 LAB — PROTIME-INR
INR: 1.2 (ref 0.8–1.2)
Prothrombin Time: 15.5 s — ABNORMAL HIGH (ref 11.4–15.2)

## 2024-07-08 MED ORDER — LACTATED RINGERS IV BOLUS
1000.0000 mL | Freq: Once | INTRAVENOUS | Status: AC
  Administered 2024-07-08: 1000 mL via INTRAVENOUS

## 2024-07-08 MED ORDER — PANTOPRAZOLE SODIUM 40 MG IV SOLR: 40.0000 mg | INTRAVENOUS | Status: DC

## 2024-07-08 MED ORDER — TRAZODONE HCL 100 MG PO TABS
100.0000 mg | ORAL_TABLET | Freq: Once | ORAL | Status: AC
  Administered 2024-07-08: 100 mg via ORAL
  Filled 2024-07-08: qty 1

## 2024-07-08 MED ORDER — SODIUM CHLORIDE 0.9 % IV SOLN: INTRAVENOUS | Status: AC

## 2024-07-08 MED ORDER — METOCLOPRAMIDE HCL 5 MG/ML IJ SOLN
10.0000 mg | Freq: Four times a day (QID) | INTRAMUSCULAR | Status: DC | PRN
  Administered 2024-07-08 – 2024-07-09 (×4): 10 mg via INTRAVENOUS
  Filled 2024-07-08 (×4): qty 2

## 2024-07-08 MED ORDER — IOHEXOL 350 MG/ML SOLN
100.0000 mL | Freq: Once | INTRAVENOUS | Status: AC | PRN
  Administered 2024-07-08: 100 mL via INTRAVENOUS

## 2024-07-08 MED ORDER — HYOSCYAMINE SULFATE 0.125 MG SL SUBL
0.2500 mg | SUBLINGUAL_TABLET | Freq: Once | SUBLINGUAL | Status: AC
  Administered 2024-07-08: 0.25 mg via SUBLINGUAL
  Filled 2024-07-08: qty 2

## 2024-07-08 MED ORDER — LORAZEPAM 1 MG PO TABS
1.0000 mg | ORAL_TABLET | Freq: Four times a day (QID) | ORAL | Status: DC | PRN
  Administered 2024-07-08 – 2024-07-10 (×4): 1 mg via ORAL
  Filled 2024-07-08 (×4): qty 1

## 2024-07-08 MED ORDER — ALUM & MAG HYDROXIDE-SIMETH 200-200-20 MG/5ML PO SUSP
30.0000 mL | Freq: Once | ORAL | Status: AC
  Administered 2024-07-08: 30 mL via ORAL
  Filled 2024-07-08: qty 30

## 2024-07-08 MED ORDER — ACETAMINOPHEN 325 MG PO TABS
650.0000 mg | ORAL_TABLET | Freq: Four times a day (QID) | ORAL | Status: DC | PRN
  Administered 2024-07-08 – 2024-07-10 (×5): 650 mg via ORAL
  Filled 2024-07-08 (×5): qty 2

## 2024-07-08 MED ORDER — ACETAMINOPHEN 650 MG RE SUPP: 650.0000 mg | Freq: Four times a day (QID) | RECTAL | Status: DC | PRN

## 2024-07-08 MED ORDER — PANTOPRAZOLE SODIUM 40 MG IV SOLR
40.0000 mg | Freq: Once | INTRAVENOUS | Status: AC
  Administered 2024-07-08: 40 mg via INTRAVENOUS
  Filled 2024-07-08: qty 10

## 2024-07-08 NOTE — ED Provider Notes (Signed)
 [place] EMERGENCY DEPARTMENT AT [facility] Provider Note   CSN: [id number] Arrival date & time: 07/08/24  0840     Patient presents with: Emesis   Cory Palmer is a 63 y.o. male.    Emesis Presents with vomiting.  Reportedly has had 2 to 3 days of coffee-ground emesis.  Reviewing notes does have a history of cirrhosis and esophageal varices.  States he does not frequently have bowel movements but does not remember them being black.  States he does feel little lightheaded.  Upper abdominal pain.  Of note recently seen in the ER for wounds on his legs and started on antibiotics which he has not been able to get filled.  History of atrial fibrillation but not on anticoagulation.  Social determinant of health includes his homelessness.     Prior to Admission medications   Medication Sig Start Date End Date Taking? Authorizing Provider  doxycycline  (VIBRAMYCIN ) 100 MG capsule Take 1 capsule (100 mg total) by mouth 2 (two) times daily. One po bid x 7 days 07/05/24   Emil Share, DO  ondansetron  (ZOFRAN ) 4 MG tablet Take 1 tablet (4 mg total) by mouth every 6 (six) hours. Patient not taking: Reported on 06/01/2024 05/24/24   Theotis Cameron HERO, PA-C  pantoprazole  (PROTONIX ) 40 MG tablet Take 1 tablet (40 mg total) by mouth daily. 06/01/24   Towana Ozell BROCKS, MD  QUEtiapine  (SEROQUEL ) 300 MG tablet Take 1 tablet (300 mg total) by mouth at bedtime. 09/06/23   Mannie Jerel PARAS, NP  traZODone  (DESYREL ) 150 MG tablet Take 150 mg by mouth at bedtime. 04/17/24   [provider]    Allergies: Atorvastatin calcium  and Gemfibrozil    Review of Systems  Gastrointestinal:  Positive for vomiting.    Updated Vital Signs BP 133/87   Pulse 95   Temp 98.5 F (36.9 C) (Oral)   Resp 18   SpO2 95%   Physical Exam Vitals and nursing note reviewed.  Cardiovascular:     Rate and Rhythm: Tachycardia present. Rhythm irregular.  Abdominal:     Tenderness: There is abdominal  tenderness.     Comments: Tenderness and fullness in upper abdomen.  Musculoskeletal:     Comments: Bilateral below the knee amputations with some relatively superficial wounds.  Skin:    Coloration: Skin is not jaundiced.  Neurological:     Mental Status: He is alert and oriented to person, place, and time.     (all labs ordered are listed, but only abnormal results are displayed) Labs Reviewed  CBC WITH DIFFERENTIAL/PLATELET - Abnormal; Notable for the following components:      Result Value   WBC 13.5 (*)    RBC 3.71 (*)    Hemoglobin 12.4 (*)    HCT 35.8 (*)    Platelets 120 (*)    Neutro Abs 11.0 (*)    Abs Immature Granulocytes 0.16 (*)    All other components within normal limits  COMPREHENSIVE METABOLIC PANEL WITH GFR - Abnormal; Notable for the following components:   Glucose, Bld 160 (*)    Calcium  8.7 (*)    Total Protein 6.2 (*)    Total Bilirubin 1.9 (*)    All other components within normal limits  PROTIME-INR - Abnormal; Notable for the following components:   Prothrombin Time 15.5 (*)    All other components within normal limits  POC OCCULT BLOOD, ED  TYPE AND SCREEN    EKG: EKG Interpretation Date/Time:  Monday July 08 2024 08:53:55 EST Ventricular Rate:  134 PR Interval:    QRS Duration:  101 QT Interval:  354 QTC Calculation: 529 R Axis:   78  Text Interpretation: Atrial fibrillation Anteroseptal infarct, age indeterminate Minimal ST depression, diffuse leads Prolonged QT interval with rate increase Confirmed by Patsey Lot [phone]) on 07/08/2024 9:00:54 AM  Radiology: CT Chest W Contrast Result Date: 07/08/2024 CLINICAL DATA:  Chest pain and vomiting. EXAM: CT CHEST WITH CONTRAST TECHNIQUE: Multidetector CT imaging of the chest was performed during intravenous contrast administration. RADIATION DOSE REDUCTION: This exam was performed according to the departmental dose-optimization program which includes automated exposure control,  adjustment of the mA and/or kV according to patient size and/or use of iterative reconstruction technique. CONTRAST:  OMNIPAQUE  IOHEXOL  350 MG/ML SOLN COMPARISON:  06/01/2024. FINDINGS: Cardiovascular: Atherosclerotic calcification of the aorta, aortic valve and coronary arteries. Enlarged pulmonic trunk and heart. No pericardial effusion. Mediastinum/Nodes: Thoracic inlet lymph nodes are not enlarged by CT size criteria. Mediastinal lymph nodes measure up to 10 mm in the low right paratracheal station, chronic. No hilar or axillary adenopathy. Air in the esophagus can be seen with dysmotility. Lower esophageal wall thickening. Gastroesophageal varices. Lungs/Pleura: Interstitial thickening with peribronchovascular nodularity and consolidation in the right upper lobe, similar to 06/01/2024 and indicative of postinfectious/postinflammatory scarring when compared with 12/13/2022. Dependent atelectasis in the lower lobes, right greater than left. Tiny bilateral pleural effusions. Airway is unremarkable. Upper Abdomen: CT abdomen dictated separately. Musculoskeletal: Degenerative changes in the spine. Old T9 inferior endplate and T12 superior endplate compression fractures. IMPRESSION: 1. Tiny bilateral pleural effusions. 2. Lower esophageal wall thickening.  Please correlate clinically. 3. CT abdomen dictated separately. 4. Postinfectious or postinflammatory scarring in the right upper lobe. 5. Aortic atherosclerosis (ICD10-I70.0). Coronary artery calcification. 6. Enlarged pulmonic trunk, indicative of pulmonary arterial hypertension. Electronically Signed   By: Newell Eke M.D.   On: 07/08/2024 14:33   CT ANGIO GI BLEED Result Date: 07/08/2024 CLINICAL DATA:  Dark coffee-ground emesis.  Leg and chest pain. EXAM: CTA ABDOMEN AND PELVIS WITHOUT AND WITH CONTRAST TECHNIQUE: Multidetector CT imaging of the abdomen and pelvis was performed using the standard protocol during bolus administration of intravenous  contrast. Multiplanar reconstructed images and MIPs were obtained and reviewed to evaluate the vascular anatomy. RADIATION DOSE REDUCTION: This exam was performed according to the departmental dose-optimization program which includes automated exposure control, adjustment of the mA and/or kV according to patient size and/or use of iterative reconstruction technique. CONTRAST:  OMNIPAQUE  IOHEXOL  350 MG/ML SOLN COMPARISON:  06/01/2024. FINDINGS: VASCULAR Aorta: Atherosclerotic calcification.  No aneurysm or dissection. Celiac: Ostial calcification with approximately 50% narrowing. No dissection. SMA: Ostial calcification with likely greater than 50% narrowing. No dissection. Renals: Ostial calcification bilaterally. Two right and a single left accessory renal arteries. IMA: Patent. Inflow: Atherosclerotic calcification.  No aneurysm. Veins: Poorly evaluated due to lack of opacification. Review of the MIP images confirms the above findings. NON-VASCULAR Lower chest: CT chest with contrast dated separately. Hepatobiliary: Liver is markedly cirrhotic. Gallbladder is grossly unremarkable. No biliary ductal dilatation. Pancreas: Negative. Spleen: Enlarged, 19.8 cm. Adrenals/Urinary Tract: Adrenal glands are unremarkable. Low-attenuation lesions in the right kidney. No specific follow-up necessary. Kidneys are otherwise unremarkable. Ureters are decompressed. Bladder is low in volume which likely contributes to wall thickening. Stomach/Bowel: Gastroesophageal varices. Recanalized paraumbilical vein. Additional varices are seen in the inguinal regions and lower anatomic pelvis. Stomach, small bowel, appendix and colon are unremarkable. Moderate stool burden. Lymphatic: Numerous  small abdominal peritoneal ligament and retroperitoneal lymph nodes, as before. Reproductive: Prostate is normal in size. Other: No free fluid.  Mesenteries and peritoneum are unremarkable. Musculoskeletal: Degenerative changes in the spine.  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 Near grade 2 anterolisthesis of L4 on L5 secondary to bilateral pars defects. Slight compression of the T12 superior endplate, as before. IMPRESSION: VASCULAR 1. No evidence of a gastrointestinal bleed. 2. Aortic atherosclerosis (ICD10-I70.0). Ostial calcification in narrowing involving the celiac and superior mesenteric artery origins. NON-VASCULAR 1. Cirrhosis with portal hypertension. 2. Numerous small abdominal peritoneal ligament and retroperitoneal lymph nodes. Difficult to exclude a lymphoproliferative disorder. 3. 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 4. CT chest dictated separately. Electronically Signed   By: Newell Eke M.D.   On: 07/08/2024 14:27     Procedures   Medications Ordered in the ED  0.9 %  sodium chloride  infusion ( Intravenous New Bag/Given 07/08/24 0957)  pantoprazole  (PROTONIX ) injection 40 mg (40 mg Intravenous Given 07/08/24 0958)  lactated ringers  bolus 1,000 mL (1,000 mLs Intravenous New Bag/Given 07/08/24 0952)  iohexol  (OMNIPAQUE ) 350 MG/ML injection 100 mL (100 mLs Intravenous Contrast Given 07/08/24 1308)                                    Medical Decision Making Amount and/or Complexity of Data Reviewed Labs: ordered. Radiology: ordered.  Risk Prescription drug management. Decision regarding hospitalization.   Patient was vomiting.  Has had some coffee-ground emesis.  Reviewing previous imaging.  CT scan just over a month ago did show esophagitis with some free fluid in the mediastinum.  Will get CT scan to evaluate for this.  Also will get CT angiography of chest for GI bleed to help evaluate if there is active severe bleeding at this time.  Will give IV Protonix .  For now we will not give octreotide.  Hemoglobin is reassuring.  Guaiac done with yellow stool.  No gross blood seen.  Was guaiac negative.  CT scan does show potential esophagitis.  Also CT scan does not show active GI bleed but also has  potential nodes potentially lymphoproliferative.  I think with the atrial fibrillation with RVR at times and reported Emesis with coffee grounds.  I think he would benefit from admission to the hospital.  Will discuss with hospitalist.      Final diagnoses:  Gastrointestinal hemorrhage, unspecified gastrointestinal hemorrhage type  Atrial fibrillation, rapid [facility])    ED Discharge Orders     None          Patsey Lot, MD 07/08/24 1441

## 2024-07-08 NOTE — ED Notes (Signed)
 Provided pt with sprite, clear liquids ok per EDP

## 2024-07-08 NOTE — ED Notes (Signed)
Light green sent to lab

## 2024-07-08 NOTE — ED Triage Notes (Signed)
 Pt BIB [facility] from [facility] for emesis x3 days.  EMS noted dark coffee ground emesis at site.  Pt has bilateral amputee with open wounds on both, stage 1.  Pain in both legs and chest.  Seen at [facility] recently, unable to get antibiotics   HR 116 95% RA RR 22 Cbg 200 99/60

## 2024-07-08 NOTE — Plan of Care (Signed)
  Problem: Safety: Goal: Ability to remain free from injury will improve Outcome: Progressing   Problem: Pain Managment: Goal: General experience of comfort will improve and/or be controlled Outcome: Progressing   Problem: Coping: Goal: Level of anxiety will decrease Outcome: Progressing

## 2024-07-08 NOTE — H&P (Signed)
 History and Physical  ALMALIK WEISSBERG FMW:[id number] DOB: 02/08/1961 DOA: 07/08/2024  PCP: Delores Rojelio Caldron, NP   Chief Complaint: Coffee-ground emesis  HPI: LADARIOUS KRESSE is a 63 y.o. male with medical history significant for bipolar disorder, hypertension, liver cirrhosis, bilateral below the knee amputations, paroxysmal atrial fibrillation not on anticoagulation being admitted to the hospital with 24 hours of coffee-ground emesis.  Patient states that he was in his usual state of health until yesterday afternoon, when he started having some epigastric burning discomfort, followed by several episodes of coffee-ground emesis.  He has not had any dark stools, and denies any hematemesis.  Denies any chest pain, had some dizziness here in the emergency department.  Notably he was seen in the emergency department on 12/5 with complaints of left lower extremity pain after falling out of his wheelchair, he was felt to possibly have cellulitis was prescribed doxycycline  but has not taken this yet.  Review of Systems: Please see HPI for pertinent positives and negatives. A complete 10 system review of systems are otherwise negative.  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   Stroke [facility])    Past Surgical History:  Procedure Laterality Date   AMPUTATION Left 09/28/2018   Procedure: LEFT TRANSMETATARSAL AMPUTATION;  Surgeon: Harden Jerona GAILS, MD;  Location: [facility] OR;  Service: Orthopedics;  Laterality: Left;   AMPUTATION Left 10/23/2019   Procedure: AMPUTATION BELOW KNEE;  Surgeon: Harden Jerona GAILS, MD;  Location: [facility] OR;  Service: Orthopedics;  Laterality: Left;   BELOW KNEE  LEG AMPUTATION Right    ESOPHAGOGASTRODUODENOSCOPY (EGD) WITH PROPOFOL  N/A 10/19/2020   Procedure: ESOPHAGOGASTRODUODENOSCOPY (EGD) WITH PROPOFOL ;  Surgeon: Kristie Lamprey, MD;  Location: [facility] ENDOSCOPY;  Service: Endoscopy;  Laterality: N/A;   TEE WITHOUT CARDIOVERSION N/A 10/25/2019   Procedure: TRANSESOPHAGEAL ECHOCARDIOGRAM (TEE);  Surgeon: Pietro Redell RAMAN, MD;  Location: [facility] ENDOSCOPY;  Service: Cardiovascular;  Laterality: N/A;   Social History:  reports that he has been smoking cigarettes. He has been exposed to tobacco smoke. He has never used smokeless tobacco. He reports current alcohol use. He reports that he does not currently use drugs after having used the following drugs: Cocaine.  Allergies  Allergen Reactions   Atorvastatin Calcium  Nausea And Vomiting   Gemfibrozil Other (See Comments)    Pt cannot recall symptoms    Family History  Problem Relation Age of Onset   Hypertension Other    Diabetes Mellitus II Other      Prior to Admission medications   Medication Sig Start Date End Date Taking? Authorizing Provider  doxycycline  (VIBRAMYCIN ) 100 MG capsule Take 1 capsule (100 mg total) by mouth 2 (two) times daily. One po bid x 7 days 07/05/24   Emil Share, DO  ondansetron  (ZOFRAN ) 4 MG tablet Take 1 tablet (4 mg total) by mouth every 6 (six) hours. Patient not taking: Reported on 06/01/2024 05/24/24   Theotis Cameron HERO, PA-C  pantoprazole  (PROTONIX ) 40 MG tablet Take 1 tablet (40 mg total) by mouth daily. 06/01/24   Towana Ozell BROCKS, MD  QUEtiapine  (SEROQUEL ) 300 MG tablet Take 1 tablet (300 mg total) by  mouth at bedtime. 09/06/23   Mannie Jerel PARAS, NP  traZODone  (DESYREL ) 150 MG tablet Take 150 mg by mouth at bedtime. 04/17/24   [provider]    Physical Exam: BP 113/75 (BP Location: Left Arm)   Pulse (!) 101   Temp 98.5 F (36.9 C) (Oral)   Resp 19   SpO2 98%  General:  Alert, oriented, calm, in no acute distress, resting comfortably on room air,  disheveled Eyes: EOMI, clear conjuctivae, white sclerea Cardiovascular: RRR, no murmurs or rubs, no peripheral edema  Respiratory: clear to auscultation bilaterally, no wheezes, no crackles  Abdomen: soft, nontender, nondistended, normal bowel tones heard  Skin: dry, no rashes  Musculoskeletal: no joint effusions, normal range of motion  Psychiatric: appropriate affect, normal speech  Neurologic: extraocular muscles intact, clear speech, moving all extremities with intact sensorium         Labs on Admission:  Basic Metabolic Panel: Recent Labs  Lab 07/05/24 1835 07/08/24 1215  NA 136 137  K 3.6 3.5  CL 106 104  CO2 20* 23  GLUCOSE 216* 160*  BUN 10 17  CREATININE 0.74 0.63  CALCIUM  8.3* 8.7*   Liver Function Tests: Recent Labs  Lab 07/05/24 1835 07/08/24 1215  AST 27 20  ALT 18 12  ALKPHOS 113 121  BILITOT 1.6* 1.9*  PROT 6.5 6.2*  ALBUMIN 3.2* 3.6   No results for input(s): LIPASE, AMYLASE in the last 168 hours. No results for input(s): AMMONIA in the last 168 hours. CBC: Recent Labs  Lab 07/05/24 1835 07/08/24 0857  WBC 7.7 13.5*  NEUTROABS  --  11.0*  HGB 12.2* 12.4*  HCT 36.0* 35.8*  MCV 97.3 96.5  PLT 103* 120*   Cardiac Enzymes: No results for input(s): CKTOTAL, CKMB, CKMBINDEX, TROPONINI in the last 168 hours. BNP (last 3 results) Recent Labs    06/01/24 1236  BNP 26.6    ProBNP (last 3 results) No results for input(s): PROBNP in the last 8760 hours.  CBG: Recent Labs  Lab 07/05/24 1923  GLUCAP 194*    Radiological Exams on Admission: CT Chest W Contrast Result Date: 07/08/2024 CLINICAL DATA:  Chest pain and vomiting. EXAM: CT CHEST WITH CONTRAST TECHNIQUE: Multidetector CT imaging of the chest was performed during intravenous contrast administration. RADIATION DOSE REDUCTION: This exam was performed according to the departmental dose-optimization program which includes automated exposure control, adjustment of the mA  and/or kV according to patient size and/or use of iterative reconstruction technique. CONTRAST:  OMNIPAQUE  IOHEXOL  350 MG/ML SOLN COMPARISON:  06/01/2024. FINDINGS: Cardiovascular: Atherosclerotic calcification of the aorta, aortic valve and coronary arteries. Enlarged pulmonic trunk and heart. No pericardial effusion. Mediastinum/Nodes: Thoracic inlet lymph nodes are not enlarged by CT size criteria. Mediastinal lymph nodes measure up to 10 mm in the low right paratracheal station, chronic. No hilar or axillary adenopathy. Air in the esophagus can be seen with dysmotility. Lower esophageal wall thickening. Gastroesophageal varices. Lungs/Pleura: Interstitial thickening with peribronchovascular nodularity and consolidation in the right upper lobe, similar to 06/01/2024 and indicative of postinfectious/postinflammatory scarring when compared with 12/13/2022. Dependent atelectasis in the lower lobes, right greater than left. Tiny bilateral pleural effusions. Airway is unremarkable. Upper Abdomen: CT abdomen dictated separately. Musculoskeletal: Degenerative changes in the spine. Old T9 inferior endplate and T12 superior endplate compression fractures. IMPRESSION: 1. Tiny bilateral pleural effusions. 2. Lower esophageal wall thickening.  Please correlate clinically. 3. CT abdomen dictated separately. 4. Postinfectious or postinflammatory scarring in the right upper lobe.  5. Aortic atherosclerosis (ICD10-I70.0). Coronary artery calcification. 6. Enlarged pulmonic trunk, indicative of pulmonary arterial hypertension. Electronically Signed   By: Newell Eke M.D.   On: 07/08/2024 14:33   CT ANGIO GI BLEED Result Date: 07/08/2024 CLINICAL DATA:  Dark coffee-ground emesis.  Leg and chest pain. EXAM: CTA ABDOMEN AND PELVIS WITHOUT AND WITH CONTRAST TECHNIQUE: Multidetector CT imaging of the abdomen and pelvis was performed using the standard protocol during bolus administration of intravenous contrast.  Multiplanar reconstructed images and MIPs were obtained and reviewed to evaluate the vascular anatomy. RADIATION DOSE REDUCTION: This exam was performed according to the departmental dose-optimization program which includes automated exposure control, adjustment of the mA and/or kV according to patient size and/or use of iterative reconstruction technique. CONTRAST:  OMNIPAQUE  IOHEXOL  350 MG/ML SOLN COMPARISON:  06/01/2024. FINDINGS: VASCULAR Aorta: Atherosclerotic calcification.  No aneurysm or dissection. Celiac: Ostial calcification with approximately 50% narrowing. No dissection. SMA: Ostial calcification with likely greater than 50% narrowing. No dissection. Renals: Ostial calcification bilaterally. Two right and a single left accessory renal arteries. IMA: Patent. Inflow: Atherosclerotic calcification.  No aneurysm. Veins: Poorly evaluated due to lack of opacification. Review of the MIP images confirms the above findings. NON-VASCULAR Lower chest: CT chest with contrast dated separately. Hepatobiliary: Liver is markedly cirrhotic. Gallbladder is grossly unremarkable. No biliary ductal dilatation. Pancreas: Negative. Spleen: Enlarged, 19.8 cm. Adrenals/Urinary Tract: Adrenal glands are unremarkable. Low-attenuation lesions in the right kidney. No specific follow-up necessary. Kidneys are otherwise unremarkable. Ureters are decompressed. Bladder is low in volume which likely contributes to wall thickening. Stomach/Bowel: Gastroesophageal varices. Recanalized paraumbilical vein. Additional varices are seen in the inguinal regions and lower anatomic pelvis. Stomach, small bowel, appendix and colon are unremarkable. Moderate stool burden. Lymphatic: Numerous small abdominal peritoneal ligament and retroperitoneal lymph nodes, as before. Reproductive: Prostate is normal in size. Other: No free fluid.  Mesenteries and peritoneum are unremarkable. Musculoskeletal: Degenerative changes in the spine. Avascular  necrosis in the femoral heads. Near grade 2 anterolisthesis of L4 on L5 secondary to bilateral pars defects. Slight compression of the T12 superior endplate, as before. IMPRESSION: VASCULAR 1. No evidence of a gastrointestinal bleed. 2. Aortic atherosclerosis (ICD10-I70.0). Ostial calcification in narrowing involving the celiac and superior mesenteric artery origins. NON-VASCULAR 1. Cirrhosis with portal hypertension. 2. Numerous small abdominal peritoneal ligament and retroperitoneal lymph nodes. Difficult to exclude a lymphoproliferative disorder. 3. 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 4. CT chest dictated separately. Electronically Signed   By: Newell Eke M.D.   On: 07/08/2024 14:27   Assessment/Plan TEJ MURDAUGH is a 63 y.o. male with medical history significant for bipolar disorder, hypertension, liver cirrhosis, bilateral below the knee amputations, paroxysmal atrial fibrillation not on anticoagulation being admitted to the hospital with 24 hours of coffee-ground emesis.  Coffee-ground emesis-patient with known liver cirrhosis, but denies any prior history of GI bleeding.  Has documented history of GERD, had upper endoscopy with Dr. Kristie in 2022 but I cannot see those notes. -Observation admission -Monitor closely on progressive -Avoid blood thinners -Trend hemoglobin every 8 hours -IV PPI daily -Clear liquid diet, n.p.o. after midnight -GI consult requested  Bipolar disorder-will resume Seroquel  once dose is confirmed  DVT prophylaxis: SCDs only    Code Status: Full Code  Consults called: Patient would be considered unassigned, GI Dr. Dianna consulted.  Admission status: Observation  Time spent: 49 minutes  Orvil Faraone CHRISTELLA Gail MD [facility] Pager [phone]  If 7PM-7AM, please contact night-coverage [email or web address] Password [email or web address]  07/08/2024, 3:31 PM

## 2024-07-08 NOTE — ED Notes (Signed)
 Orthostatic vitals with patient laying down and sitting up were taken. Unable to do the standing vitals due to patient having amputated feet. Patient stated he felt dizzy and nauseous sitting up. RN notified.

## 2024-07-09 ENCOUNTER — Encounter (HOSPITAL_COMMUNITY): Payer: Self-pay | Admitting: Internal Medicine

## 2024-07-09 ENCOUNTER — Inpatient Hospital Stay (HOSPITAL_COMMUNITY)

## 2024-07-09 ENCOUNTER — Observation Stay (HOSPITAL_COMMUNITY)

## 2024-07-09 ENCOUNTER — Encounter (HOSPITAL_COMMUNITY): Admission: EM | Disposition: A | Payer: Self-pay | Source: Home / Self Care | Attending: Internal Medicine

## 2024-07-09 ENCOUNTER — Other Ambulatory Visit (HOSPITAL_COMMUNITY): Payer: Self-pay

## 2024-07-09 HISTORY — PX: ESOPHAGOGASTRODUODENOSCOPY: SHX5428

## 2024-07-09 LAB — BASIC METABOLIC PANEL WITH GFR
Anion gap: 11 (ref 5–15)
BUN: 12 mg/dL (ref 8–23)
CO2: 20 mmol/L — ABNORMAL LOW (ref 22–32)
Calcium: 8.1 mg/dL — ABNORMAL LOW (ref 8.9–10.3)
Chloride: 105 mmol/L (ref 98–111)
Creatinine, Ser: 0.57 mg/dL — ABNORMAL LOW (ref 0.61–1.24)
GFR, Estimated: >60 mL/min (ref >60)
Glucose, Bld: 141 mg/dL — ABNORMAL HIGH (ref 70–99)
Potassium: 3.4 mmol/L — ABNORMAL LOW (ref 3.5–5.1)
Sodium: 136 mmol/L (ref 135–145)

## 2024-07-09 LAB — CBC
HCT: 31.3 % — ABNORMAL LOW (ref 39.0–52.0)
Hemoglobin: 10.7 g/dL — ABNORMAL LOW (ref 13.0–17.0)
MCH: 33.5 pg (ref 26.0–34.0)
MCHC: 34.2 g/dL (ref 30.0–36.0)
MCV: 98.1 fL (ref 80.0–100.0)
Platelets: 98 K/uL — ABNORMAL LOW (ref 150–400)
RBC: 3.19 MIL/uL — ABNORMAL LOW (ref 4.22–5.81)
RDW: 14.1 % (ref 11.5–15.5)
WBC: 8.3 K/uL (ref 4.0–10.5)
nRBC: 0 % (ref 0.0–0.2)

## 2024-07-09 LAB — GLUCOSE, CAPILLARY: Glucose-Capillary: 145 mg/dL — ABNORMAL HIGH (ref 70–99)

## 2024-07-09 LAB — HEMOGLOBIN AND HEMATOCRIT, BLOOD
HCT: 30.9 % — ABNORMAL LOW (ref 39.0–52.0)
Hemoglobin: 10.8 g/dL — ABNORMAL LOW (ref 13.0–17.0)

## 2024-07-09 SURGERY — EGD (ESOPHAGOGASTRODUODENOSCOPY)
Anesthesia: Monitor Anesthesia Care

## 2024-07-09 MED ORDER — PANTOPRAZOLE SODIUM 40 MG IV SOLR
40.0000 mg | INTRAVENOUS | Status: DC
  Administered 2024-07-09: 40 mg via INTRAVENOUS
  Filled 2024-07-09: qty 10

## 2024-07-09 MED ORDER — COLLAGENASE 250 UNIT/GM EX OINT
TOPICAL_OINTMENT | Freq: Every day | CUTANEOUS | Status: DC
  Administered 2024-07-09: 1 via TOPICAL
  Filled 2024-07-09: qty 30

## 2024-07-09 MED ORDER — LIDOCAINE 2% (20 MG/ML) 5 ML SYRINGE
INTRAMUSCULAR | Status: DC | PRN
  Administered 2024-07-09: 80 mg via INTRAVENOUS

## 2024-07-09 MED ORDER — VANCOMYCIN HCL IN DEXTROSE 1-5 GM/200ML-% IV SOLN
1000.0000 mg | Freq: Once | INTRAVENOUS | Status: AC
  Administered 2024-07-09: 1000 mg via INTRAVENOUS
  Filled 2024-07-09: qty 200

## 2024-07-09 MED ORDER — PROPOFOL 10 MG/ML IV BOLUS
INTRAVENOUS | Status: DC | PRN
  Administered 2024-07-09 (×3): 100 mg via INTRAVENOUS

## 2024-07-09 MED ORDER — SODIUM CHLORIDE 0.9 % IV SOLN: INTRAVENOUS | Status: DC

## 2024-07-09 MED ORDER — PROPOFOL 500 MG/50ML IV EMUL
INTRAVENOUS | Status: AC
  Filled 2024-07-09: qty 50

## 2024-07-09 MED ORDER — PANTOPRAZOLE SODIUM 40 MG IV SOLR
40.0000 mg | Freq: Two times a day (BID) | INTRAVENOUS | Status: DC
  Administered 2024-07-09 – 2024-07-10 (×2): 40 mg via INTRAVENOUS
  Filled 2024-07-09 (×2): qty 10

## 2024-07-09 MED ORDER — PROPOFOL 10 MG/ML IV BOLUS
INTRAVENOUS | Status: AC
  Filled 2024-07-09: qty 20

## 2024-07-09 MED ORDER — VANCOMYCIN HCL 750 MG/150ML IV SOLN
750.0000 mg | Freq: Three times a day (TID) | INTRAVENOUS | Status: DC
  Administered 2024-07-09 – 2024-07-10 (×2): 750 mg via INTRAVENOUS
  Filled 2024-07-09 (×3): qty 150

## 2024-07-09 NOTE — Progress Notes (Signed)
 Pharmacy Antibiotic Note  Cory Palmer is a 63 y.o. male admitted on 07/08/2024 with stump infection.  Pharmacy has been consulted for Vancomycin  dosing.  Active Problem(s): c/o coffee-ground emesis and epigastric pain. Hgb 12.4  ID: Stump infection - Tmax 100.5, WBC WNL, Scr <1  Vanco 12/9>>  Plan: Vancomycin  750 mg IV Q 8 hrs. Goal AUC 400-550. Expected AUC: 496 SCr used: 0.8   Height: 5' 8 (172.7 cm) Weight: 114.2 kg (251 lb 12.3 oz) IBW/kg (Calculated) : 68.4  Temp (24hrs), Avg:99 F (37.2 C), Min:98.3 F (36.8 C), Max:100.5 F (38.1 C)  Recent Labs  Lab 07/05/24 1835 07/08/24 0857 07/08/24 1215 07/09/24 0807  WBC 7.7 13.5*  --  8.3  CREATININE 0.74  --  0.63 0.57*    Estimated Creatinine Clearance: 114.4 mL/min (A) (by C-G formula based on SCr of 0.57 mg/dL (L)).    Allergies  Allergen Reactions   Atorvastatin Calcium  Nausea And Vomiting   Gemfibrozil Other (See Comments)    Pt cannot recall symptoms    Makenli Derstine Karoline Marina, PharmD, BCPS Clinical Staff Pharmacist  [facility] 07/09/2024 10:53 AM

## 2024-07-09 NOTE — Plan of Care (Signed)

## 2024-07-09 NOTE — Consult Note (Addendum)
 WOC Nurse Consult Note: Reason for Consult:Consult requested for stump wounds.  Performed remotely after review of photos and progress notes in the EMR.  Bilat stump with scattered areas of full thickness wounds covered by eschar/dry brown scabs to anterior and posterior leg.Generalized edema and erythremia to left stump.      Dressing procedure/placement/frequency: Topical treatment orders provided for bedside nurses to perform to assist with nonviable tissue as follows: Apply Santyl  to bilat stump wounds Q day, then cover with moist gauze and foam dressings, change foam dressings Q 3 days or PRN soiling.  Post note 2:20: Discussed left stump appearance via Secure chat with the bedside nurse via Secure chat related to left stump erythremia and edema.  Secure chat message sent to the primary team requesting a possible CT scan to r/o an abscess.   Please re-consult if further assistance is needed.  Thank-you, Stephane Fought MSN, RN, CWOCN, CWCN-AP, CNS Contact Mon-Fri 0700-1500: [phone]

## 2024-07-09 NOTE — Plan of Care (Signed)
  Problem: Clinical Measurements: Goal: Respiratory complications will improve Outcome: Progressing   Problem: Clinical Measurements: Goal: Cardiovascular complication will be avoided Outcome: Progressing   Problem: Elimination: Goal: Will not experience complications related to urinary retention Outcome: Progressing   Problem: Safety: Goal: Ability to remain free from injury will improve Outcome: Progressing

## 2024-07-09 NOTE — Transfer of Care (Signed)
 Immediate Anesthesia Transfer of Care Note  Patient: Cory Palmer  Procedure(s) Performed: EGD (ESOPHAGOGASTRODUODENOSCOPY)  Patient Location: PACU  Anesthesia Type:MAC  Level of Consciousness: awake, alert , and oriented  Airway & Oxygen Therapy: Patient Spontanous Breathing and Patient connected to face mask oxygen  Post-op Assessment: Report given to RN and Post -op Vital signs reviewed and stable  Post vital signs: Reviewed and stable  Last Vitals:  Vitals Value Taken Time  BP 115/64 07/09/24 14:18  Temp    Pulse 94 07/09/24 14:20  Resp 28 07/09/24 14:20  SpO2 95 % 07/09/24 14:20  Vitals shown include unfiled device data.  Last Pain:  Vitals:   07/09/24 1230  TempSrc: Temporal  PainSc: 10-Worst pain ever         Complications: No notable events documented.

## 2024-07-09 NOTE — Consult Note (Signed)
 Referring Provider: TH Primary Care Physician:  Delores Rojelio Caldron, NP Primary Gastroenterologist: Sampson  Reason for Consultation: Coffee-ground emesis  HPI: Cory Palmer is a 63 y.o. male with past medical history of substance abuse, cirrhosis of the liver, bilateral below-knee amputation, paroxysmal A-fib not on anticoagulation, bipolar disorder presented to the hospital with coffee-ground emesis.  His hemoglobin was normal last month.  Hemoglobin dropped to 10.3 this morning.  Normal LFT with mild elevated T. bili at 1.9 which is chronic.  Occult blood negative.  Normal INR.  Urine drug screen positive for cocaine.  Patient seen and examined at bedside.  According to him, he has been having black-colored stool for last few days and also had few episodes of dark-colored vomiting.  Denies seeing bright red blood per rectum.  Denies any abdominal pain.  Denies trouble swallowing or pain while swallowing.  Not sure of NSAID use.  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   Stroke [facility])     Past Surgical History:  Procedure Laterality Date   AMPUTATION Left 09/28/2018   Procedure: LEFT TRANSMETATARSAL AMPUTATION;  Surgeon: Harden Jerona GAILS, MD;  Location: [facility] OR;  Service: Orthopedics;  Laterality: Left;   AMPUTATION Left 10/23/2019   Procedure: AMPUTATION BELOW KNEE;  Surgeon: Harden Jerona GAILS, MD;  Location: [facility] OR;  Service: Orthopedics;  Laterality: Left;   BELOW KNEE LEG AMPUTATION Right    ESOPHAGOGASTRODUODENOSCOPY (EGD) WITH PROPOFOL  N/A 10/19/2020   Procedure: ESOPHAGOGASTRODUODENOSCOPY (EGD) WITH PROPOFOL ;  Surgeon: Kristie Lamprey, MD;  Location: [facility]  ENDOSCOPY;  Service: Endoscopy;  Laterality: N/A;   TEE WITHOUT CARDIOVERSION N/A 10/25/2019   Procedure: TRANSESOPHAGEAL ECHOCARDIOGRAM (TEE);  Surgeon: Pietro Redell RAMAN, MD;  Location: [facility] ENDOSCOPY;  Service: Cardiovascular;  Laterality: N/A;    Prior to Admission medications   Medication Sig Start Date End Date Taking? Authorizing Provider  QUEtiapine  (SEROQUEL ) 300 MG tablet Take 1 tablet (300 mg total) by mouth at bedtime. 09/06/23  Yes Mannie Jerel PARAS, NP  traZODone  (DESYREL ) 150 MG tablet Take 150 mg by mouth at bedtime. 04/17/24  Yes [provider]  doxycycline  (VIBRAMYCIN ) 100 MG capsule Take 1 capsule (100 mg total) by mouth 2 (two) times daily. One po bid x 7 days 07/05/24   Emil Share, DO  ondansetron  (ZOFRAN ) 4 MG tablet Take 1 tablet (4 mg total) by mouth every 6 (six) hours. Patient not taking: Reported on 07/08/2024 05/24/24   Theotis Cameron HERO, PA-C  pantoprazole  (PROTONIX ) 40 MG tablet Take 1 tablet (40 mg total) by mouth daily. Patient not taking: Reported on 07/08/2024 06/01/24   Towana Ozell BROCKS, MD    Scheduled Meds:  pantoprazole  (PROTONIX ) IV  40 mg Intravenous Q12H   Continuous Infusions:  sodium chloride  125 mL/hr at 07/09/24 0027   PRN Meds:.acetaminophen  **OR** acetaminophen , LORazepam , metoCLOPramide  (REGLAN ) injection  Allergies as of 07/08/2024 - Review Complete 07/08/2024  Allergen Reaction Noted   Atorvastatin calcium  Nausea And Vomiting 12/13/2012   Gemfibrozil Other (See Comments) 12/13/2012    Family History  Problem Relation Age of Onset   Hypertension Other    Diabetes Mellitus II Other  Social History   Socioeconomic History   Marital status: Single    Spouse name: Not on file   Number of children: Not on file   Years of education: Not on file   Highest education level: Not on file  Occupational History   Not on file  Tobacco Use   Smoking status: Every Day    Current packs/day: 0.25    Types: Cigarettes    Passive  exposure: Current   Smokeless tobacco: Never  Vaping Use   Vaping status: Never Used  Substance and Sexual Activity   Alcohol use: Yes   Drug use: Not Currently    Types: Cocaine    Comment: Last used in July 2023   Sexual activity: Not Currently  Other Topics Concern   Not on file  Social History Narrative   Not on file   Social Drivers of Health   Financial Resource Strain: Not on File (11/18/2021)   Received from [employer]    Financial Resource Strain: 0  Food Insecurity: Food Insecurity Present (07/08/2024)   Hunger Vital Sign    Worried About Running Out of Food in the Last Year: Often true    Ran Out of Food in the Last Year: Often true  Transportation Needs: Unmet Transportation Needs (07/08/2024)   PRAPARE - Administrator, Civil Service (Medical): Yes    Lack of Transportation (Non-Medical): Yes  Physical Activity: Not on File (11/18/2021)   Received from [facility]   Physical Activity    Physical Activity: 0  Stress: Not on File (11/18/2021)   Received from [facility]   Stress    Stress: 0  Social Connections: Not on File (04/15/2023)   Received from [facility]   Social Connections    Connectedness: 0  Intimate Partner Violence: Not At Risk (07/08/2024)   Humiliation, Afraid, Rape, and Kick questionnaire    Fear of Current or Ex-Partner: No    Emotionally Abused: No    Physically Abused: No    Sexually Abused: No    Review of Systems: All negative except as stated above in HPI.  Physical Exam: Vital signs: Vitals:   07/08/24 2328 07/09/24 0431  BP: 124/62 130/74  Pulse: 87 85  Resp: 20 16  Temp: 99 F (37.2 C) 98.3 F (36.8 C)  SpO2: 97% 95%   Last BM Date : 07/05/24 General:   Alert,  Well-developed, well-nourished, pleasant and cooperative in NAD Normocephalic, atraumatic, extraocular movement intact Lungs: No visible respiratory distress Heart:  Regular rate and rhythm; no murmurs, clicks, rubs,  or gallops. Abdomen: Soft,  nontender, nondistended, bowel sound present, no peritoneal signs Bilateral BKA Alert and oriented x 3 Mood and affect normal Rectal:  Deferred  GI:  Lab Results: Recent Labs    07/08/24 0857 07/08/24 1631 07/08/24 2257  WBC 13.5*  --   --   HGB 12.4* 10.9* 10.3*  HCT 35.8* 31.8* 29.7*  PLT 120*  --   --    BMET Recent Labs    07/08/24 1215  NA 137  K 3.5  CL 104  CO2 23  GLUCOSE 160*  BUN 17  CREATININE 0.63  CALCIUM  8.7*   LFT Recent Labs    07/08/24 1215  PROT 6.2*  ALBUMIN 3.6  AST 20  ALT 12  ALKPHOS 121  BILITOT 1.9*   PT/INR Recent Labs    07/08/24 1054  LABPROT 15.5*  INR 1.2     Studies/Results: CT Chest W  Contrast Result Date: 07/08/2024 CLINICAL DATA:  Chest pain and vomiting. EXAM: CT CHEST WITH CONTRAST TECHNIQUE: Multidetector CT imaging of the chest was performed during intravenous contrast administration. RADIATION DOSE REDUCTION: This exam was performed according to the departmental dose-optimization program which includes automated exposure control, adjustment of the mA and/or kV according to patient size and/or use of iterative reconstruction technique. CONTRAST:  OMNIPAQUE  IOHEXOL  350 MG/ML SOLN COMPARISON:  06/01/2024. FINDINGS: Cardiovascular: Atherosclerotic calcification of the aorta, aortic valve and coronary arteries. Enlarged pulmonic trunk and heart. No pericardial effusion. Mediastinum/Nodes: Thoracic inlet lymph nodes are not enlarged by CT size criteria. Mediastinal lymph nodes measure up to 10 mm in the low right paratracheal station, chronic. No hilar or axillary adenopathy. Air in the esophagus can be seen with dysmotility. Lower esophageal wall thickening. Gastroesophageal varices. Lungs/Pleura: Interstitial thickening with peribronchovascular nodularity and consolidation in the right upper lobe, similar to 06/01/2024 and indicative of postinfectious/postinflammatory scarring when compared with 12/13/2022. Dependent  atelectasis in the lower lobes, right greater than left. Tiny bilateral pleural effusions. Airway is unremarkable. Upper Abdomen: CT abdomen dictated separately. Musculoskeletal: Degenerative changes in the spine. Old T9 inferior endplate and T12 superior endplate compression fractures. IMPRESSION: 1. Tiny bilateral pleural effusions. 2. Lower esophageal wall thickening.  Please correlate clinically. 3. CT abdomen dictated separately. 4. Postinfectious or postinflammatory scarring in the right upper lobe. 5. Aortic atherosclerosis (ICD10-I70.0). Coronary artery calcification. 6. Enlarged pulmonic trunk, indicative of pulmonary arterial hypertension. Electronically Signed   By: Newell Eke M.D.   On: 07/08/2024 14:33   CT ANGIO GI BLEED Result Date: 07/08/2024 CLINICAL DATA:  Dark coffee-ground emesis.  Leg and chest pain. EXAM: CTA ABDOMEN AND PELVIS WITHOUT AND WITH CONTRAST TECHNIQUE: Multidetector CT imaging of the abdomen and pelvis was performed using the standard protocol during bolus administration of intravenous contrast. Multiplanar reconstructed images and MIPs were obtained and reviewed to evaluate the vascular anatomy. RADIATION DOSE REDUCTION: This exam was performed according to the departmental dose-optimization program which includes automated exposure control, adjustment of the mA and/or kV according to patient size and/or use of iterative reconstruction technique. CONTRAST:  OMNIPAQUE  IOHEXOL  350 MG/ML SOLN COMPARISON:  06/01/2024. FINDINGS: VASCULAR Aorta: Atherosclerotic calcification.  No aneurysm or dissection. Celiac: Ostial calcification with approximately 50% narrowing. No dissection. SMA: Ostial calcification with likely greater than 50% narrowing. No dissection. Renals: Ostial calcification bilaterally. Two right and a single left accessory renal arteries. IMA: Patent. Inflow: Atherosclerotic calcification.  No aneurysm. Veins: Poorly evaluated due to lack of opacification.  Review of the MIP images confirms the above findings. NON-VASCULAR Lower chest: CT chest with contrast dated separately. Hepatobiliary: Liver is markedly cirrhotic. Gallbladder is grossly unremarkable. No biliary ductal dilatation. Pancreas: Negative. Spleen: Enlarged, 19.8 cm. Adrenals/Urinary Tract: Adrenal glands are unremarkable. Low-attenuation lesions in the right kidney. No specific follow-up necessary. Kidneys are otherwise unremarkable. Ureters are decompressed. Bladder is low in volume which likely contributes to wall thickening. Stomach/Bowel: Gastroesophageal varices. Recanalized paraumbilical vein. Additional varices are seen in the inguinal regions and lower anatomic pelvis. Stomach, small bowel, appendix and colon are unremarkable. Moderate stool burden. Lymphatic: Numerous small abdominal peritoneal ligament and retroperitoneal lymph nodes, as before. Reproductive: Prostate is normal in size. Other: No free fluid.  Mesenteries and peritoneum are unremarkable. Musculoskeletal: Degenerative changes in the spine. 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 Near grade 2 anterolisthesis of L4 on L5 secondary to bilateral pars defects. Slight compression of the T12 superior endplate, as before. IMPRESSION: VASCULAR 1.  No evidence of a gastrointestinal bleed. 2. Aortic atherosclerosis (ICD10-I70.0). Ostial calcification in narrowing involving the celiac and superior mesenteric artery origins. NON-VASCULAR 1. Cirrhosis with portal hypertension. 2. Numerous small abdominal peritoneal ligament and retroperitoneal lymph nodes. Difficult to exclude a lymphoproliferative disorder. 3. 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 4. CT chest dictated separately. Electronically Signed   By: Newell Eke M.D.   On: 07/08/2024 14:27    Impression/Plan: -Coffee-ground emesis and melena in a patient with history of cirrhosis with CT scan showing gastroesophageal varices. - Acute blood loss anemia - Substance use with  cocaine abuse - History of paroxysmal atrial fibrillation.  Not on anticoagulation - History of CVA  MELD 3.0: 11 at 07/09/2024  8:07 AM MELD-Na: 11 at 07/09/2024  8:07 AM Calculated from: Serum Creatinine: 0.57 mg/dL (Using min of 1 mg/dL) at 87/0/7974  1:92 AM Serum Sodium: 136 mmol/L at 07/09/2024  8:07 AM Total Bilirubin: 1.9 mg/dL at 87/08/7972 87:84 PM Serum Albumin: 3.6 g/dL (Using max of 3.5 g/dL) at 87/08/7972 87:84 PM INR(ratio): 1.2 at 07/08/2024 10:54 AM Age at listing (hypothetical): 14 years Sex: Male at 07/09/2024  8:07 AM    Recommendations -------------------------- - Plan for EGD today with possible band ligation or treatment of ulcer - Continue other supportive care for now.  Risks (bleeding, infection, bowel perforation that could require surgery, sedation-related changes in cardiopulmonary systems), benefits (identification and possible treatment of source of symptoms, exclusion of certain causes of symptoms), and alternatives (watchful waiting, radiographic imaging studies, empiric medical treatment)  were explained to patient/family in detail and patient wishes to proceed.     LOS: 0 days   Layla Lah  MD, FACP 07/09/2024, 8:26 AM  Contact #  [phone]

## 2024-07-09 NOTE — Anesthesia Preprocedure Evaluation (Addendum)
 Anesthesia Evaluation  Patient identified by MRN, date of birth, ID band Patient awake    Reviewed: Allergy & Precautions, NPO status , Patient's Chart, lab work & pertinent test results  History of Anesthesia Complications Negative for: history of anesthetic complications  Airway Mallampati: II  TM Distance: >3 FB Neck ROM: Full    Dental  (+) Edentulous Upper, Edentulous Lower, Dental Advisory Given   Pulmonary Current Smoker and Patient abstained from smoking., PE   breath sounds clear to auscultation       Cardiovascular hypertension, Pt. on medications + Peripheral Vascular Disease  + dysrhythmias Atrial Fibrillation  Rhythm:Irregular Rate:Normal  TTE (2025): 1. Left ventricular ejection fraction, by estimation, is 50%. The left  ventricle has mildly decreased function. The left ventricle demonstrates  global hypokinesis. The left ventricular internal cavity size was mildly  dilated. There is mild concentric left ventricular hypertrophy. Left ventricular diastolic parameters are indeterminate.   2. Right ventricular systolic function is mildly reduced. The right  ventricular size is normal. Tricuspid regurgitation signal is inadequate  for assessing PA pressure.   3. Left atrial size was moderately dilated.   4. The mitral valve is normal in structure. No evidence of mitral valve  regurgitation. No evidence of mitral stenosis.   5. The aortic valve is tricuspid. There is mild calcification of the  aortic valve. Aortic valve regurgitation is not visualized. No aortic  stenosis is present. Possible small vegetation on the aortic valve.  Recommend TEE for better visualization.   6. The inferior vena cava is dilated in size with <50% respiratory  variability, suggesting right atrial pressure of 15 mmHg.   7. The patient was in atrial fibrillation.   8. Technically difficult study with poor acoustic windows.      Neuro/Psych  PSYCHIATRIC DISORDERS Anxiety  Bipolar Disorder   CVA    GI/Hepatic ,GERD  Medicated and Controlled,,(+) Cirrhosis     substance abuse  alcohol use and cocaine use  Endo/Other  diabetes, Type 2    Renal/GU      Musculoskeletal   Abdominal   Peds  Hematology  (+) Blood dyscrasia, anemia Hgb 10.7, Plts 98K (07/09/24)   Anesthesia Other Findings   Reproductive/Obstetrics                              Anesthesia Physical Anesthesia Plan  ASA: 4  Anesthesia Plan: MAC   Post-op Pain Management:    Induction: Intravenous  PONV Risk Score and Plan: 0 and Propofol  infusion and Treatment may vary due to age or medical condition  Airway Management Planned: Simple Face Mask and Natural Airway  Additional Equipment: None  Intra-op Plan:   Post-operative Plan:   Informed Consent:   Plan Discussed with: CRNA  Anesthesia Plan Comments:         Anesthesia Quick Evaluation

## 2024-07-09 NOTE — Op Note (Signed)
 [facility] Patient Name: Cory Palmer Procedure Date: 07/09/2024 MRN: [id number] Attending MD: Layla Lah , MD, [id number] Date of Birth: [birth year] CSN: [id number] Age: 63 Admit Type: Inpatient Procedure:                Upper GI endoscopy Indications:              Coffee-ground emesis, Melena Providers:                Layla Lah, MD, Hoy Penner, RN, Farris Southgate, Technician Referring MD:              Medicines:                Sedation Administered by an Anesthesia Professional Complications:            No immediate complications. Estimated Blood Loss:     Estimated blood loss was minimal. Procedure:                Pre-Anesthesia Assessment:                           - Prior to the procedure, a History and Physical                            was performed, and patient medications and                            allergies were reviewed. The patient's tolerance of                            previous anesthesia was also reviewed. The risks                            and benefits of the procedure and the sedation                            options and risks were discussed with the patient.                            All questions were answered, and informed consent                            was obtained. Prior Anticoagulants: The patient has                            taken no anticoagulant or antiplatelet agents. ASA                            Grade Assessment: IV - A patient with severe                            systemic disease that is a constant threat to life.  After reviewing the risks and benefits, the patient                            was deemed in satisfactory condition to undergo the                            procedure.                           After obtaining informed consent, the endoscope was                            passed under direct vision. Throughout the                             procedure, the patient's blood pressure, pulse, and                            oxygen saturations were monitored continuously. The                            [id number]-[id number] ([id number]) Olympus endoscope was introduced                            through the mouth, and advanced to the second part                            of duodenum. The upper GI endoscopy was performed                            with difficulty. The patient tolerated the                            procedure well. Scope In: Scope Out: Findings:      LA Grade D (one or more mucosal breaks involving at least 75% of       esophageal circumference) esophagitis with no bleeding was found in the       middle and lower thirds of the esophagus. Biopsies were taken with a       cold forceps for histology.      A small hiatal hernia was present.      Diffuse mild inflammation characterized by congestion (edema), erosions       and erythema was found in the entire examined stomach. Biopsies were       taken with a cold forceps for histology.      The cardia and gastric fundus were normal on retroflexion.      Few non-bleeding superficial duodenal ulcers with no stigmata of       bleeding were found in the duodenal bulb and in the first portion of the       duodenum. The largest lesion was 4 mm in largest dimension. Impression:               - LA Grade D erosive esophagitis with no bleeding.  Biopsied.                           - Small hiatal hernia.                           - Chronic gastritis. Biopsied.                           - Non-bleeding duodenal ulcers with no stigmata of                            bleeding. Moderate Sedation:      Moderate (conscious) sedation was personally administered by an       anesthesia professional. The following parameters were monitored: oxygen       saturation, heart rate, blood pressure, and response to care. Recommendation:           - Return patient to hospital ward  for ongoing care.                           - Soft diet.                           - Use Protonix  (pantoprazole ) 40 mg PO BID for 2                            months.                           - Repeat upper endoscopy in 2 months to check                            healing. Procedure Code(s):        --- Professional ---                           [phone], Esophagogastroduodenoscopy, flexible,                            transoral; with biopsy, single or multiple Diagnosis Code(s):        --- Professional ---                           K20.80, Other esophagitis without bleeding                           K44.9, Diaphragmatic hernia without obstruction or                            gangrene                           K29.50, Unspecified chronic gastritis without                            bleeding  K26.9, Duodenal ulcer, unspecified as acute or                            chronic, without hemorrhage or perforation                           K92.0, Hematemesis                           K92.1, Melena (includes Hematochezia) CPT copyright 2022 [employer]. All rights reserved. The codes documented in this report are preliminary and upon coder review may  be revised to meet current compliance requirements. Layla Lah, MD Layla Lah, MD 07/09/2024 2:13:04 PM Number of Addenda: 0

## 2024-07-09 NOTE — Progress Notes (Signed)
 PROGRESS NOTE    TENNIS MCKINNON  FMW:[id number] DOB: 1961-06-08 DOA: 07/08/2024 PCP: Delores Rojelio Caldron, NP   Brief Narrative:  63 y.o. male with medical history significant for bipolar disorder, hypertension, liver cirrhosis, bilateral below the knee amputations, paroxysmal atrial fibrillation not on anticoagulation presented with coffee-ground emesis and epigastric pain.  On presentation, hemoglobin was 12.4.  CT angio GI bleed was negative for any evidence of GI bleed; CT chest with contrast showed lower esophageal wall thickening.  He was started on IV Protonix .  GI was consulted.  Assessment & Plan:   Possible upper GI bleeding presenting with coffee-ground emesis -Imaging as above.  Hemoglobin 12.4 on presentation.  Hemoglobin pending this morning.  Monitor H&H. - Change IV Protonix  to every 12 hours.  NPO.  Await GI evaluation and recommendations - Decrease normal saline to 75 cc an hour.    Leukocytosis - Possibly reactive.  Monitor  Thrombocytopenia - Chronic.  Possibly from cirrhosis of liver.  Monitor intermittently  Cirrhosis of liver - Stable.  Outpatient follow-up with PCP and GI  Paroxysmal A-fib - Currently not on anticoagulation as an outpatient.  Rate controlled.  History of pulmonary embolism - Anticoagulation plan as above.  Bipolar disorder/depression/anxiety - Resume home regimen once verified.  Outpatient follow-up with PCP/psychiatry  Status post bilateral BKA - Wheelchair dependent  Homelessness - Consult TOC  DVT prophylaxis: Cannot use Lovenox  or heparin  because of possible GI bleed Code Status: Full Family Communication: none at bedside Disposition Plan: Status is: Observation The patient will require care spanning > 2 midnights and should be moved to inpatient because: Of severity of illness    Consultants: GI  Procedures: None  Antimicrobials: None   Subjective: Patient seen and examined at bedside.  Had fever last night.  No  chest pain, shortness of breath, vomiting reported.  Objective: Vitals:   07/08/24 1800 07/08/24 2019 07/08/24 2328 07/09/24 0431  BP:  118/62 124/62 130/74  Pulse:  92 87 85  Resp:  18 20 16   Temp:  (!) 100.5 F (38.1 C) 99 F (37.2 C) 98.3 F (36.8 C)  TempSrc:    Oral  SpO2:  98% 97% 95%  Weight: 114.2 kg     Height: 5' 8 (1.727 m)       Intake/Output Summary (Last 24 hours) at 07/09/2024 0748 Last data filed at 07/08/2024 2259 Gross per 24 hour  Intake 1862.35 ml  Output 500 ml  Net 1362.35 ml   Filed Weights   07/08/24 1800  Weight: 114.2 kg    Examination:  General exam: Appears calm and comfortable  Respiratory system: Bilateral decreased breath sounds at bases Cardiovascular system: S1 & S2 heard, Rate controlled Gastrointestinal system: Abdomen is distended, soft and nontender. Normal bowel sounds heard. Extremities: Bilateral BKA present Central nervous system: Alert and oriented.  Slow to respond.  No focal neurological deficits. Moving extremities Skin: No rashes, lesions or ulcers Psychiatry: Flat affect.  Not agitated.    Data Reviewed: I have personally reviewed following labs and imaging studies  CBC: Recent Labs  Lab 07/05/24 1835 07/08/24 0857 07/08/24 1631 07/08/24 2257  WBC 7.7 13.5*  --   --   NEUTROABS  --  11.0*  --   --   HGB 12.2* 12.4* 10.9* 10.3*  HCT 36.0* 35.8* 31.8* 29.7*  MCV 97.3 96.5  --   --   PLT 103* 120*  --   --    Basic Metabolic Panel: Recent Labs  Lab  07/05/24 1835 07/08/24 1215  NA 136 137  K 3.6 3.5  CL 106 104  CO2 20* 23  GLUCOSE 216* 160*  BUN 10 17  CREATININE 0.74 0.63  CALCIUM  8.3* 8.7*   GFR: Estimated Creatinine Clearance: 114.4 mL/min (by C-G formula based on SCr of 0.63 mg/dL). Liver Function Tests: Recent Labs  Lab 07/05/24 1835 07/08/24 1215  AST 27 20  ALT 18 12  ALKPHOS 113 121  BILITOT 1.6* 1.9*  PROT 6.5 6.2*  ALBUMIN 3.2* 3.6   No results for input(s): LIPASE, AMYLASE  in the last 168 hours. No results for input(s): AMMONIA in the last 168 hours. Coagulation Profile: Recent Labs  Lab 07/08/24 1054  INR 1.2   Cardiac Enzymes: No results for input(s): CKTOTAL, CKMB, CKMBINDEX, TROPONINI in the last 168 hours. BNP (last 3 results) No results for input(s): PROBNP in the last 8760 hours. HbA1C: No results for input(s): HGBA1C in the last 72 hours. CBG: Recent Labs  Lab 07/05/24 1923  GLUCAP 194*   Lipid Profile: No results for input(s): CHOL, HDL, LDLCALC, TRIG, CHOLHDL, LDLDIRECT in the last 72 hours. Thyroid Function Tests: No results for input(s): TSH, T4TOTAL, FREET4, T3FREE, THYROIDAB in the last 72 hours. Anemia Panel: No results for input(s): VITAMINB12, FOLATE, FERRITIN, TIBC, IRON, RETICCTPCT in the last 72 hours. Sepsis Labs: No results for input(s): PROCALCITON, LATICACIDVEN in the last 168 hours.  No results found for this or any previous visit (from the past 240 hours).       Radiology Studies: CT Chest W Contrast Result Date: 07/08/2024 CLINICAL DATA:  Chest pain and vomiting. EXAM: CT CHEST WITH CONTRAST TECHNIQUE: Multidetector CT imaging of the chest was performed during intravenous contrast administration. RADIATION DOSE REDUCTION: This exam was performed according to the departmental dose-optimization program which includes automated exposure control, adjustment of the mA and/or kV according to patient size and/or use of iterative reconstruction technique. CONTRAST:  OMNIPAQUE  IOHEXOL  350 MG/ML SOLN COMPARISON:  06/01/2024. FINDINGS: Cardiovascular: Atherosclerotic calcification of the aorta, aortic valve and coronary arteries. Enlarged pulmonic trunk and heart. No pericardial effusion. Mediastinum/Nodes: Thoracic inlet lymph nodes are not enlarged by CT size criteria. Mediastinal lymph nodes measure up to 10 mm in the low right paratracheal station, chronic. No hilar or  axillary adenopathy. Air in the esophagus can be seen with dysmotility. Lower esophageal wall thickening. Gastroesophageal varices. Lungs/Pleura: Interstitial thickening with peribronchovascular nodularity and consolidation in the right upper lobe, similar to 06/01/2024 and indicative of postinfectious/postinflammatory scarring when compared with 12/13/2022. Dependent atelectasis in the lower lobes, right greater than left. Tiny bilateral pleural effusions. Airway is unremarkable. Upper Abdomen: CT abdomen dictated separately. Musculoskeletal: Degenerative changes in the spine. Old T9 inferior endplate and T12 superior endplate compression fractures. IMPRESSION: 1. Tiny bilateral pleural effusions. 2. Lower esophageal wall thickening.  Please correlate clinically. 3. CT abdomen dictated separately. 4. Postinfectious or postinflammatory scarring in the right upper lobe. 5. Aortic atherosclerosis (ICD10-I70.0). Coronary artery calcification. 6. Enlarged pulmonic trunk, indicative of pulmonary arterial hypertension. Electronically Signed   By: Newell Eke M.D.   On: 07/08/2024 14:33   CT ANGIO GI BLEED Result Date: 07/08/2024 CLINICAL DATA:  Dark coffee-ground emesis.  Leg and chest pain. EXAM: CTA ABDOMEN AND PELVIS WITHOUT AND WITH CONTRAST TECHNIQUE: Multidetector CT imaging of the abdomen and pelvis was performed using the standard protocol during bolus administration of intravenous contrast. Multiplanar reconstructed images and MIPs were obtained and reviewed to evaluate the vascular anatomy. RADIATION DOSE REDUCTION:  This exam was performed according to the departmental dose-optimization program which includes automated exposure control, adjustment of the mA and/or kV according to patient size and/or use of iterative reconstruction technique. CONTRAST:  OMNIPAQUE  IOHEXOL  350 MG/ML SOLN COMPARISON:  06/01/2024. FINDINGS: VASCULAR Aorta: Atherosclerotic calcification.  No aneurysm or dissection.  Celiac: Ostial calcification with approximately 50% narrowing. No dissection. SMA: Ostial calcification with likely greater than 50% narrowing. No dissection. Renals: Ostial calcification bilaterally. Two right and a single left accessory renal arteries. IMA: Patent. Inflow: Atherosclerotic calcification.  No aneurysm. Veins: Poorly evaluated due to lack of opacification. Review of the MIP images confirms the above findings. NON-VASCULAR Lower chest: CT chest with contrast dated separately. Hepatobiliary: Liver is markedly cirrhotic. Gallbladder is grossly unremarkable. No biliary ductal dilatation. Pancreas: Negative. Spleen: Enlarged, 19.8 cm. Adrenals/Urinary Tract: Adrenal glands are unremarkable. Low-attenuation lesions in the right kidney. No specific follow-up necessary. Kidneys are otherwise unremarkable. Ureters are decompressed. Bladder is low in volume which likely contributes to wall thickening. Stomach/Bowel: Gastroesophageal varices. Recanalized paraumbilical vein. Additional varices are seen in the inguinal regions and lower anatomic pelvis. Stomach, small bowel, appendix and colon are unremarkable. Moderate stool burden. Lymphatic: Numerous small abdominal peritoneal ligament and retroperitoneal lymph nodes, as before. Reproductive: Prostate is normal in size. Other: No free fluid.  Mesenteries and peritoneum are unremarkable. Musculoskeletal: Degenerative changes in the spine. 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 Near grade 2 anterolisthesis of L4 on L5 secondary to bilateral pars defects. Slight compression of the T12 superior endplate, as before. IMPRESSION: VASCULAR 1. No evidence of a gastrointestinal bleed. 2. Aortic atherosclerosis (ICD10-I70.0). Ostial calcification in narrowing involving the celiac and superior mesenteric artery origins. NON-VASCULAR 1. Cirrhosis with portal hypertension. 2. Numerous small abdominal peritoneal ligament and retroperitoneal lymph nodes. Difficult to  exclude a lymphoproliferative disorder. 3. 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 4. CT chest dictated separately. Electronically Signed   By: Newell Eke M.D.   On: 07/08/2024 14:27        Scheduled Meds:  pantoprazole  (PROTONIX ) IV  40 mg Intravenous Q12H   Continuous Infusions:  sodium chloride  125 mL/hr at 07/09/24 9972          Sophie Mao, MD [facility] 07/09/2024, 7:48 AM

## 2024-07-09 NOTE — Brief Op Note (Signed)
 07/09/2024  2:13 PM  PATIENT:  Debby KATHEE Hoit  63 y.o. male  PRE-OPERATIVE DIAGNOSIS:  Coffee-ground emesis  POST-OPERATIVE DIAGNOSIS:  esophagitis, gastritis, duodenal ulcers, gastric biopsies r/o hpylori, esophageal biopsies r/o esophagitis  PROCEDURE:  Procedure(s): EGD (ESOPHAGOGASTRODUODENOSCOPY) (N/A)  SURGEON:  Surgeons and Role:    * Jenniger Figiel, MD - Primary  Findings ----------- - EGD showed LA grade D erosive esophagitis, gastritis and multiple small duodenal ulcers.  Clean-based.  Gastric and esophageal biopsies taken.  Recommendations ------------------------- - Recommend Protonix  40 mg twice a day for 2 months followed by Protonix  40 mg once a day - Avoid NSAIDs, smoking and alcohol use - Start soft diet and slowly advance as tolerated - Recommend repeat EGD in 2 months to document healing of esophagitis - GI will sign off.  Call us  back if needed.  Layla Lah MD, FACP 07/09/2024, 2:14 PM  Contact #  [phone]

## 2024-07-10 ENCOUNTER — Other Ambulatory Visit (HOSPITAL_COMMUNITY): Payer: Self-pay

## 2024-07-10 DIAGNOSIS — K922 Gastrointestinal hemorrhage, unspecified: Secondary | ICD-10-CM | POA: Diagnosis not present

## 2024-07-10 LAB — CBC WITH DIFFERENTIAL/PLATELET
Abs Immature Granulocytes: 0.03 K/uL (ref 0.00–0.07)
Basophils Absolute: 0 K/uL (ref 0.0–0.1)
Basophils Relative: 0 %
Eosinophils Absolute: 0.2 K/uL (ref 0.0–0.5)
Eosinophils Relative: 6 %
HCT: 29.1 % — ABNORMAL LOW (ref 39.0–52.0)
Hemoglobin: 10.3 g/dL — ABNORMAL LOW (ref 13.0–17.0)
Immature Granulocytes: 1 %
Lymphocytes Relative: 23 %
Lymphs Abs: 0.9 K/uL (ref 0.7–4.0)
MCH: 33.7 pg (ref 26.0–34.0)
MCHC: 35.4 g/dL (ref 30.0–36.0)
MCV: 95.1 fL (ref 80.0–100.0)
Monocytes Absolute: 0.4 K/uL (ref 0.1–1.0)
Monocytes Relative: 9 %
Neutro Abs: 2.5 K/uL (ref 1.7–7.7)
Neutrophils Relative %: 61 %
Platelets: 85 K/uL — ABNORMAL LOW (ref 150–400)
RBC: 3.06 MIL/uL — ABNORMAL LOW (ref 4.22–5.81)
RDW: 13.7 % (ref 11.5–15.5)
Smear Review: NORMAL
WBC: 4.1 K/uL (ref 4.0–10.5)
nRBC: 0 % (ref 0.0–0.2)

## 2024-07-10 LAB — BASIC METABOLIC PANEL WITH GFR
Anion gap: 11 (ref 5–15)
BUN: 9 mg/dL (ref 8–23)
CO2: 20 mmol/L — ABNORMAL LOW (ref 22–32)
Calcium: 8.2 mg/dL — ABNORMAL LOW (ref 8.9–10.3)
Chloride: 109 mmol/L (ref 98–111)
Creatinine, Ser: 0.59 mg/dL — ABNORMAL LOW (ref 0.61–1.24)
GFR, Estimated: >60 mL/min (ref >60)
Glucose, Bld: 120 mg/dL — ABNORMAL HIGH (ref 70–99)
Potassium: 3.4 mmol/L — ABNORMAL LOW (ref 3.5–5.1)
Sodium: 141 mmol/L (ref 135–145)

## 2024-07-10 LAB — MAGNESIUM: Magnesium: 1.7 mg/dL (ref 1.7–2.4)

## 2024-07-10 LAB — SURGICAL PATHOLOGY

## 2024-07-10 MED ORDER — ONDANSETRON HCL 4 MG PO TABS
4.0000 mg | ORAL_TABLET | Freq: Three times a day (TID) | ORAL | 0 refills | Status: AC | PRN
  Filled 2024-07-10: qty 20, 7d supply, fill #0

## 2024-07-10 MED ORDER — POTASSIUM CHLORIDE CRYS ER 20 MEQ PO TBCR
40.0000 meq | EXTENDED_RELEASE_TABLET | Freq: Once | ORAL | Status: AC
  Administered 2024-07-10: 40 meq via ORAL
  Filled 2024-07-10: qty 2

## 2024-07-10 MED ORDER — DOXYCYCLINE HYCLATE 100 MG PO CAPS
100.0000 mg | ORAL_CAPSULE | Freq: Two times a day (BID) | ORAL | 0 refills | Status: DC
  Filled 2024-07-10: qty 14, 7d supply, fill #0

## 2024-07-10 MED ORDER — TRAZODONE HCL 100 MG PO TABS
100.0000 mg | ORAL_TABLET | Freq: Once | ORAL | Status: AC
  Administered 2024-07-10: 100 mg via ORAL
  Filled 2024-07-10: qty 1

## 2024-07-10 MED ORDER — PANTOPRAZOLE SODIUM 40 MG PO TBEC
40.0000 mg | DELAYED_RELEASE_TABLET | Freq: Two times a day (BID) | ORAL | 0 refills | Status: AC
  Filled 2024-07-10: qty 60, 30d supply, fill #0

## 2024-07-10 MED ORDER — COLLAGENASE 250 UNIT/GM EX OINT
TOPICAL_OINTMENT | Freq: Every day | CUTANEOUS | 0 refills | Status: AC
  Filled 2024-07-10: qty 30, 30d supply, fill #0

## 2024-07-10 NOTE — Plan of Care (Signed)

## 2024-07-10 NOTE — Progress Notes (Signed)
 Discharge meds in a secure bag delivered to patient in room by this RN. Santyl  refuse by patient due to cost of $300. Patient given santyl  tube that he used inpatient.

## 2024-07-10 NOTE — Discharge Summary (Signed)
 Physician Discharge Summary  Cory Palmer FMW:[id number] DOB: 09-24-1960 DOA: 07/08/2024  PCP: Delores Rojelio Caldron, NP  Admit date: 07/08/2024 Discharge date: 07/10/2024  Recommendations for Outpatient Follow-up:  Follow up with PCP in 1 week with repeat CBC/BMP Outpatient follow-up with GI Outpatient follow-up with orthopedic/Dr. Harden Comply with medications and follow-up Follow up in ED if symptoms worsen or new appear   Home Health: No Equipment/Devices: None  Discharge Condition: Stable CODE STATUS: Full Diet recommendation: Heart healthy/soft diet  Brief/Interim Summary: 63 y.o. male with medical history significant for bipolar disorder, hypertension, liver cirrhosis, bilateral below the knee amputations, paroxysmal atrial fibrillation not on anticoagulation presented with coffee-ground emesis and epigastric pain.  On presentation, hemoglobin was 12.4.  CT angio GI bleed was negative for any evidence of GI bleed; CT chest with contrast showed lower esophageal wall thickening.  He was started on IV Protonix .  GI was consulted.  He underwent EGD on 07/09/2024 which showed LA grade D erosive esophagitis, gastritis and multiple small duodenal ulcers, clean-based; biopsies were taken.  GI recommended Protonix  40 mg twice a day for 2 months followed by once a day and recommended outpatient follow-up with EGD in 2 months and GI signed off.  Subsequently, hemoglobin has remained stable.  He was also started on IV vancomycin  for possible left stump wound/cellulitis.  X-ray of the left knee was unremarkable.  Patient feels better and feels okay to go home today.  He is afebrile and hemodynamically stable.  He will be discharged today on oral Protonix  and oral doxycycline .  Outpatient follow-up with PCP, GI and Dr. Harden.  Discharge Diagnoses:   Possible upper GI bleeding presenting with coffee-ground emesis -Imaging as above.  Hemoglobin 12.4 on presentation.  Hemoglobin 10.3 today. -Currently on  IV Protonix  - He underwent EGD on 07/09/2024 which showed LA grade D erosive esophagitis, gastritis and multiple small duodenal ulcers, clean-based; biopsies were taken.  GI recommended Protonix  40 mg twice a day for 2 months followed by once a day and recommended outpatient follow-up with EGD in 2 months and GI signed off.   -Discharge patient home today on oral Protonix .  Possible left stump cellulitis - Patient was started on doxycycline  as an outpatient but he never started it.  Treated with IV vancomycin  inpatient.  X-ray of the left knee only showed some soft tissue swelling.  Discharge patient today on oral doxycycline  for a week.  Outpatient follow-up with Dr. Kayleen.  Wound care per wound care RN recommendations.  Leukocytosis - Possibly reactive.  Resolved   Thrombocytopenia - Chronic.  Possibly from cirrhosis of liver.  Monitor intermittently as an outpatient   Cirrhosis of liver - Stable.  Outpatient follow-up with PCP and GI   Paroxysmal A-fib - Currently not on anticoagulation as an outpatient.  Rate controlled mostly.   History of pulmonary embolism - Anticoagulation plan as above.   Bipolar disorder/depression/anxiety - Not sure if he is taking any medications as an outpatient.  Outpatient follow-up with PCP/psychiatry   Status post bilateral BKA - Wheelchair dependent   Homelessness - Consulted TOC  Obesity class I - Outpatient follow-up  Discharge Instructions  Discharge Instructions     Diet - low sodium heart healthy   Complete by: As directed    Discharge wound care:   Complete by: As directed    As per wound care consult RN recommendations Apply Santyl  to bilat stump wounds Q day, then cover with moist gauze and foam dressings, change foam dressings  Q 3 days or PRN soiling   Increase activity slowly   Complete by: As directed       Allergies as of 07/10/2024       Reactions   Atorvastatin Calcium  Nausea And Vomiting   Gemfibrozil Other (See  Comments)   Pt cannot recall symptoms        Medication List     STOP taking these medications    QUEtiapine  300 MG tablet Commonly known as: SEROQUEL        TAKE these medications    collagenase  250 UNIT/GM ointment Commonly known as: SANTYL  Apply topically daily. Apply Santyl  to bilat stump wounds Q day, then cover with moist gauze and foam dressings, change foam dressings Q 3 days or PRN soiling   doxycycline  100 MG capsule Commonly known as: VIBRAMYCIN  Take 1 capsule (100 mg total) by mouth 2 (two) times daily. What changed: additional instructions   ondansetron  4 MG tablet Commonly known as: ZOFRAN  Take 1 tablet (4 mg total) by mouth every 8 (eight) hours as needed for nausea or vomiting. What changed:  when to take this reasons to take this   pantoprazole  40 MG tablet Commonly known as: PROTONIX  Take 1 tablet (40 mg total) by mouth 2 (two) times daily before a meal. What changed: when to take this   traZODone  150 MG tablet Commonly known as: DESYREL  Take 150 mg by mouth at bedtime.               Discharge Care Instructions  (From admission, onward)           Start     Ordered   07/10/24 0000  Discharge wound care:       Comments: As per wound care consult RN recommendations Apply Santyl  to bilat stump wounds Q day, then cover with moist gauze and foam dressings, change foam dressings Q 3 days or PRN soiling   07/10/24 0908              Follow-up Information     Delores Rojelio Caldron, NP. Schedule an appointment as soon as possible for a visit in 1 week(s).   Specialty: Nurse Practitioner Why: with repeat cbc/bmp Contact information: [address] [place] [address] [phone]         Elicia Claw, MD. Schedule an appointment as soon as possible for a visit in 1 week(s).   Specialty: Gastroenterology Contact information: [address] [place] [address] [phone]         Harden Jerona GAILS, MD. Schedule an appointment as soon as possible for a visit in 1 week(s).   Specialty: Orthopedic Surgery Contact information: [address] [place]  [place] [address] [phone]                Allergies  Allergen Reactions   Atorvastatin Calcium  Nausea And Vomiting   Gemfibrozil Other (See Comments)    Pt cannot recall symptoms    Consultations: GI   Procedures/Studies: DG Knee 3 Views Left Result Date: 07/09/2024 CLINICAL DATA:  Pain. EXAM: LEFT KNEE - 3 VIEW COMPARISON:  07/07/2022 FINDINGS: The bones are subjectively under mineralized. Below the knee amputation with smooth resection margins. Mild tibiofemoral joint space narrowing. Minor tricompartmental peripheral spurring. No fracture. No erosive or bony destructive change. No significant joint effusion. Prepatellar soft tissue thickening. Mild generalized soft tissue edema. IMPRESSION: 1. Below the knee amputation with smooth resection margins. 2. Mild tricompartmental osteoarthritis. 3. Prepatellar soft tissue thickening.  Electronically Signed   By: Andrea Gasman M.D.   On: 07/09/2024 15:49   CT Chest W Contrast Result Date: 07/08/2024 CLINICAL DATA:  Chest pain and vomiting. EXAM: CT CHEST WITH CONTRAST TECHNIQUE: Multidetector CT imaging of the chest was performed during intravenous contrast administration. RADIATION DOSE REDUCTION: This exam was performed according to the departmental dose-optimization program which includes automated exposure control, adjustment of the mA and/or kV according to patient size and/or use of iterative reconstruction technique. CONTRAST:  OMNIPAQUE  IOHEXOL  350 MG/ML SOLN COMPARISON:  06/01/2024. FINDINGS: Cardiovascular: Atherosclerotic calcification of the aorta, aortic valve and coronary arteries. Enlarged pulmonic trunk and heart. No pericardial effusion. Mediastinum/Nodes: Thoracic inlet lymph nodes are not enlarged by CT size criteria. Mediastinal lymph nodes measure up to 10 mm  in the low right paratracheal station, chronic. No hilar or axillary adenopathy. Air in the esophagus can be seen with dysmotility. Lower esophageal wall thickening. Gastroesophageal varices. Lungs/Pleura: Interstitial thickening with peribronchovascular nodularity and consolidation in the right upper lobe, similar to 06/01/2024 and indicative of postinfectious/postinflammatory scarring when compared with 12/13/2022. Dependent atelectasis in the lower lobes, right greater than left. Tiny bilateral pleural effusions. Airway is unremarkable. Upper Abdomen: CT abdomen dictated separately. Musculoskeletal: Degenerative changes in the spine. Old T9 inferior endplate and T12 superior endplate compression fractures. IMPRESSION: 1. Tiny bilateral pleural effusions. 2. Lower esophageal wall thickening.  Please correlate clinically. 3. CT abdomen dictated separately. 4. Postinfectious or postinflammatory scarring in the right upper lobe. 5. Aortic atherosclerosis (ICD10-I70.0). Coronary artery calcification. 6. Enlarged pulmonic trunk, indicative of pulmonary arterial hypertension. Electronically Signed   By: Newell Eke M.D.   On: 07/08/2024 14:33   CT ANGIO GI BLEED Result Date: 07/08/2024 CLINICAL DATA:  Dark coffee-ground emesis.  Leg and chest pain. EXAM: CTA ABDOMEN AND PELVIS WITHOUT AND WITH CONTRAST TECHNIQUE: Multidetector CT imaging of the abdomen and pelvis was performed using the standard protocol during bolus administration of intravenous contrast. Multiplanar reconstructed images and MIPs were obtained and reviewed to evaluate the vascular anatomy. RADIATION DOSE REDUCTION: This exam was performed according to the departmental dose-optimization program which includes automated exposure control, adjustment of the mA and/or kV according to patient size and/or use of iterative reconstruction technique. CONTRAST:  OMNIPAQUE  IOHEXOL  350 MG/ML SOLN COMPARISON:  06/01/2024. FINDINGS: VASCULAR Aorta:  Atherosclerotic calcification.  No aneurysm or dissection. Celiac: Ostial calcification with approximately 50% narrowing. No dissection. SMA: Ostial calcification with likely greater than 50% narrowing. No dissection. Renals: Ostial calcification bilaterally. Two right and a single left accessory renal arteries. IMA: Patent. Inflow: Atherosclerotic calcification.  No aneurysm. Veins: Poorly evaluated due to lack of opacification. Review of the MIP images confirms the above findings. NON-VASCULAR Lower chest: CT chest with contrast dated separately. Hepatobiliary: Liver is markedly cirrhotic. Gallbladder is grossly unremarkable. No biliary ductal dilatation. Pancreas: Negative. Spleen: Enlarged, 19.8 cm. Adrenals/Urinary Tract: Adrenal glands are unremarkable. Low-attenuation lesions in the right kidney. No specific follow-up necessary. Kidneys are otherwise unremarkable. Ureters are decompressed. Bladder is low in volume which likely contributes to wall thickening. Stomach/Bowel: Gastroesophageal varices. Recanalized paraumbilical vein. Additional varices are seen in the inguinal regions and lower anatomic pelvis. Stomach, small bowel, appendix and colon are unremarkable. Moderate stool burden. Lymphatic: Numerous small abdominal peritoneal ligament and retroperitoneal lymph nodes, as before. Reproductive: Prostate is normal in size. Other: No free fluid.  Mesenteries and peritoneum are unremarkable. Musculoskeletal: Degenerative changes in the spine. 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 Near grade 2 anterolisthesis of L4  on L5 secondary to bilateral pars defects. Slight compression of the T12 superior endplate, as before. IMPRESSION: VASCULAR 1. No evidence of a gastrointestinal bleed. 2. Aortic atherosclerosis (ICD10-I70.0). Ostial calcification in narrowing involving the celiac and superior mesenteric artery origins. NON-VASCULAR 1. Cirrhosis with portal hypertension. 2. Numerous small abdominal peritoneal  ligament and retroperitoneal lymph nodes. Difficult to exclude a lymphoproliferative disorder. 3. 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 4. CT chest dictated separately. Electronically Signed   By: Newell Eke M.D.   On: 07/08/2024 14:27   EGD on 07/09/2024   Subjective: Patient seen and examined at bedside.  No fever, vomiting, shortness of breath reported.  Feels okay to be discharged today.  Discharge Exam: Vitals:   07/09/24 2052 07/10/24 0539  BP: (!) 145/72 (!) 141/82  Pulse: 86 (!) 103  Resp: 18 18  Temp: 99 F (37.2 C) 97.8 F (36.6 C)  SpO2: 97% 100%    General: Awake, slow to respond, poor historian. Cardiovascular: Mild intermittent tachycardia present; S1/S2 + Respiratory: bilateral decreased breath sounds at bases with scattered crackles Abdominal: Soft, obese, NT, ND, bowel sounds + Extremities: Bilateral BKA present with dressings   The results of significant diagnostics from this hospitalization (including imaging, microbiology, ancillary and laboratory) are listed below for reference.     Microbiology: No results found for this or any previous visit (from the past 240 hours).   Labs: BNP (last 3 results) Recent Labs    06/01/24 1236  BNP 26.6   Basic Metabolic Panel: Recent Labs  Lab 07/05/24 1835 07/08/24 1215 07/09/24 0807 07/10/24 0429  NA 136 137 136 141  K 3.6 3.5 3.4* 3.4*  CL 106 104 105 109  CO2 20* 23 20* 20*  GLUCOSE 216* 160* 141* 120*  BUN 10 17 12 9   CREATININE 0.74 0.63 0.57* 0.59*  CALCIUM  8.3* 8.7* 8.1* 8.2*  MG  --   --   --  1.7   Liver Function Tests: Recent Labs  Lab 07/05/24 1835 07/08/24 1215  AST 27 20  ALT 18 12  ALKPHOS 113 121  BILITOT 1.6* 1.9*  PROT 6.5 6.2*  ALBUMIN 3.2* 3.6   No results for input(s): LIPASE, AMYLASE in the last 168 hours. No results for input(s): AMMONIA in the last 168 hours. CBC: Recent Labs  Lab 07/05/24 1835 07/08/24 0857 07/08/24 1631 07/08/24 2257  07/09/24 0807 07/09/24 1601 07/10/24 0429  WBC 7.7 13.5*  --   --  8.3  --  4.1  NEUTROABS  --  11.0*  --   --   --   --  2.5  HGB 12.2* 12.4* 10.9* 10.3* 10.7* 10.8* 10.3*  HCT 36.0* 35.8* 31.8* 29.7* 31.3* 30.9* 29.1*  MCV 97.3 96.5  --   --  98.1  --  95.1  PLT 103* 120*  --   --  98*  --  85*   Cardiac Enzymes: No results for input(s): CKTOTAL, CKMB, CKMBINDEX, TROPONINI in the last 168 hours. BNP: Invalid input(s): POCBNP CBG: Recent Labs  Lab 07/05/24 1923 07/09/24 1249  GLUCAP 194* 145*   D-Dimer No results for input(s): DDIMER in the last 72 hours. Hgb A1c No results for input(s): HGBA1C in the last 72 hours. Lipid Profile No results for input(s): CHOL, HDL, LDLCALC, TRIG, CHOLHDL, LDLDIRECT in the last 72 hours. Thyroid function studies No results for input(s): TSH, T4TOTAL, T3FREE, THYROIDAB in the last 72 hours.  Invalid input(s): FREET3 Anemia work up No results for input(s): VITAMINB12, FOLATE,  FERRITIN, TIBC, IRON, RETICCTPCT in the last 72 hours. Urinalysis    Component Value Date/Time   COLORURINE YELLOW 06/01/2024 1424   APPEARANCEUR HAZY (A) 06/01/2024 1424   LABSPEC 1.020 06/01/2024 1424   PHURINE 8.0 06/01/2024 1424   GLUCOSEU NEGATIVE 06/01/2024 1424   HGBUR NEGATIVE 06/01/2024 1424   BILIRUBINUR NEGATIVE 06/01/2024 1424   KETONESUR NEGATIVE 06/01/2024 1424   PROTEINUR NEGATIVE 06/01/2024 1424   NITRITE NEGATIVE 06/01/2024 1424   LEUKOCYTESUR NEGATIVE 06/01/2024 1424   Sepsis Labs Recent Labs  Lab 07/05/24 1835 07/08/24 0857 07/09/24 0807 07/10/24 0429  WBC 7.7 13.5* 8.3 4.1   Microbiology No results found for this or any previous visit (from the past 240 hours).   Time coordinating discharge: 35 minutes  SIGNED:   Sophie Mao, MD  [facility] 07/10/2024, 9:09 AM

## 2024-07-10 NOTE — TOC Transition Note (Incomplete)
 Transition of Care [facility]) - Discharge Note   Patient Details  Name: Cory Palmer MRN: [id number] Date of Birth: March 09, 1961  [facility] CM/SW Contact:  Tawni CHRISTELLA Eva, LCSW Phone Number: 07/10/2024, 9:28 AM   Clinical Narrative:     ICM received consult for medication assistance and PCP. Pt does not qualify for [facility] program. CSW met with pt who confirmed having a primary care provider. Pt stated his concerns about not being able to contact his caretaker. Pt Is requesting a ride to [facility] as the SW there  him find a place to live.          Patient Goals and CMS Choice            Discharge Placement                       Discharge Plan and Services Additional resources added to the After Visit Summary for                                       Social Drivers of Health (SDOH) Interventions SDOH Screenings   Food Insecurity: Food Insecurity Present (07/08/2024)  Housing: High Risk (07/08/2024)  Transportation Needs: Unmet Transportation Needs (07/08/2024)  Utilities: Not At Risk (07/08/2024)  Depression (PHQ2-9): High Risk (05/16/2022)  Financial Resource Strain: Not on File (11/18/2021)   Received from [facility]  Physical Activity: Not on File (11/18/2021)   Received from [facility]  Social Connections: Not on File (04/15/2023)   Received from [facility]  Stress: Not on File (11/18/2021)   Received from [facility]  Tobacco Use: High Risk (07/09/2024)     Readmission Risk Interventions    11/18/2021    4:15 PM  Readmission Risk Prevention Plan  Transportation Screening Complete  Medication Review (RN Care Manager) Complete  PCP or Specialist appointment within 3-5 days of discharge Complete  HRI or Home Care Consult Complete  SW Recovery Care/Counseling Consult Complete  Palliative Care Screening Not Applicable  Skilled Nursing Facility --

## 2024-07-11 NOTE — Anesthesia Postprocedure Evaluation (Signed)
 Anesthesia Post Note  Patient: Cory Palmer  Procedure(s) Performed: EGD (ESOPHAGOGASTRODUODENOSCOPY)     Patient location during evaluation: Endoscopy Anesthesia Type: MAC Level of consciousness: awake Pain management: pain level controlled Vital Signs Assessment: post-procedure vital signs reviewed and stable Respiratory status: spontaneous breathing Cardiovascular status: blood pressure returned to baseline Postop Assessment: no apparent nausea or vomiting Anesthetic complications: no   No notable events documented.         Lauraine DASEN Colhoun

## 2024-07-12 ENCOUNTER — Encounter (HOSPITAL_COMMUNITY): Payer: Self-pay | Admitting: Gastroenterology

## 2024-08-08 ENCOUNTER — Ambulatory Visit: Admitting: Orthopedic Surgery

## 2024-08-11 ENCOUNTER — Emergency Department (HOSPITAL_COMMUNITY)

## 2024-08-11 ENCOUNTER — Encounter (HOSPITAL_COMMUNITY): Payer: Self-pay

## 2024-08-11 ENCOUNTER — Observation Stay (HOSPITAL_BASED_OUTPATIENT_CLINIC_OR_DEPARTMENT_OTHER)
Admission: EM | Admit: 2024-08-11 | Discharge: 2024-08-12 | Discharge status: Against Medical Advice | Source: Home / Self Care | Attending: Emergency Medicine | Admitting: Emergency Medicine

## 2024-08-11 ENCOUNTER — Observation Stay (HOSPITAL_COMMUNITY)

## 2024-08-11 DIAGNOSIS — D696 Thrombocytopenia, unspecified: Secondary | ICD-10-CM | POA: Diagnosis present

## 2024-08-11 DIAGNOSIS — Z89511 Acquired absence of right leg below knee: Secondary | ICD-10-CM | POA: Insufficient documentation

## 2024-08-11 DIAGNOSIS — R6510 Systemic inflammatory response syndrome (SIRS) of non-infectious origin without acute organ dysfunction: Principal | ICD-10-CM

## 2024-08-11 DIAGNOSIS — F4323 Adjustment disorder with mixed anxiety and depressed mood: Secondary | ICD-10-CM | POA: Insufficient documentation

## 2024-08-11 DIAGNOSIS — F17210 Nicotine dependence, cigarettes, uncomplicated: Secondary | ICD-10-CM | POA: Insufficient documentation

## 2024-08-11 DIAGNOSIS — I10 Essential (primary) hypertension: Secondary | ICD-10-CM | POA: Diagnosis present

## 2024-08-11 DIAGNOSIS — E119 Type 2 diabetes mellitus without complications: Secondary | ICD-10-CM | POA: Insufficient documentation

## 2024-08-11 DIAGNOSIS — K7460 Unspecified cirrhosis of liver: Secondary | ICD-10-CM | POA: Diagnosis present

## 2024-08-11 DIAGNOSIS — G9340 Encephalopathy, unspecified: Secondary | ICD-10-CM | POA: Insufficient documentation

## 2024-08-11 DIAGNOSIS — Z89432 Acquired absence of left foot: Secondary | ICD-10-CM | POA: Insufficient documentation

## 2024-08-11 DIAGNOSIS — Z79899 Other long term (current) drug therapy: Secondary | ICD-10-CM | POA: Insufficient documentation

## 2024-08-11 DIAGNOSIS — D649 Anemia, unspecified: Secondary | ICD-10-CM | POA: Diagnosis present

## 2024-08-11 DIAGNOSIS — K219 Gastro-esophageal reflux disease without esophagitis: Secondary | ICD-10-CM | POA: Insufficient documentation

## 2024-08-11 DIAGNOSIS — K7030 Alcoholic cirrhosis of liver without ascites: Secondary | ICD-10-CM

## 2024-08-11 DIAGNOSIS — Z8673 Personal history of transient ischemic attack (TIA), and cerebral infarction without residual deficits: Secondary | ICD-10-CM | POA: Insufficient documentation

## 2024-08-11 DIAGNOSIS — N400 Enlarged prostate without lower urinary tract symptoms: Secondary | ICD-10-CM | POA: Insufficient documentation

## 2024-08-11 DIAGNOSIS — A419 Sepsis, unspecified organism: Principal | ICD-10-CM | POA: Diagnosis present

## 2024-08-11 DIAGNOSIS — S88111A Complete traumatic amputation at level between knee and ankle, right lower leg, initial encounter: Secondary | ICD-10-CM | POA: Diagnosis present

## 2024-08-11 DIAGNOSIS — I480 Paroxysmal atrial fibrillation: Secondary | ICD-10-CM | POA: Diagnosis present

## 2024-08-11 LAB — LACTIC ACID, PLASMA: Lactic Acid, Venous: 1.7 mmol/L (ref 0.5–1.9)

## 2024-08-11 LAB — URINALYSIS, W/ REFLEX TO CULTURE (INFECTION SUSPECTED)
Bacteria, UA: NONE SEEN
Bilirubin Urine: NEGATIVE
Glucose, UA: NEGATIVE mg/dL
Ketones, ur: NEGATIVE mg/dL
Leukocytes,Ua: NEGATIVE
Nitrite: NEGATIVE
Protein, ur: 30 mg/dL — AB
Specific Gravity, Urine: 1.024 (ref 1.005–1.030)
pH: 6 (ref 5.0–8.0)

## 2024-08-11 LAB — RESP PANEL BY RT-PCR (RSV, FLU A&B, COVID)  RVPGX2
Influenza A by PCR: NEGATIVE
Influenza B by PCR: NEGATIVE
Resp Syncytial Virus by PCR: NEGATIVE
SARS Coronavirus 2 by RT PCR: NEGATIVE

## 2024-08-11 LAB — CBC WITH DIFFERENTIAL/PLATELET
Abs Immature Granulocytes: 0.02 K/uL (ref 0.00–0.07)
Basophils Absolute: 0 K/uL (ref 0.0–0.1)
Basophils Relative: 0 %
Eosinophils Absolute: 0 K/uL (ref 0.0–0.5)
Eosinophils Relative: 0 %
HCT: 36.6 % — ABNORMAL LOW (ref 39.0–52.0)
Hemoglobin: 12.9 g/dL — ABNORMAL LOW (ref 13.0–17.0)
Immature Granulocytes: 0 %
Lymphocytes Relative: 6 %
Lymphs Abs: 0.4 K/uL — ABNORMAL LOW (ref 0.7–4.0)
MCH: 33.9 pg (ref 26.0–34.0)
MCHC: 35.2 g/dL (ref 30.0–36.0)
MCV: 96.3 fL (ref 80.0–100.0)
Monocytes Absolute: 0.4 K/uL (ref 0.1–1.0)
Monocytes Relative: 6 %
Neutro Abs: 5.7 K/uL (ref 1.7–7.7)
Neutrophils Relative %: 88 %
Platelets: 77 K/uL — ABNORMAL LOW (ref 150–400)
RBC: 3.8 MIL/uL — ABNORMAL LOW (ref 4.22–5.81)
RDW: 13.4 % (ref 11.5–15.5)
WBC: 6.6 K/uL (ref 4.0–10.5)
nRBC: 0 % (ref 0.0–0.2)

## 2024-08-11 LAB — COMPREHENSIVE METABOLIC PANEL WITH GFR
ALT: 14 U/L (ref 0–44)
AST: 36 U/L (ref 15–41)
Albumin: 3.7 g/dL (ref 3.5–5.0)
Alkaline Phosphatase: 95 U/L (ref 38–126)
Anion gap: 13 (ref 5–15)
BUN: 11 mg/dL (ref 8–23)
CO2: 21 mmol/L — ABNORMAL LOW (ref 22–32)
Calcium: 8.9 mg/dL (ref 8.9–10.3)
Chloride: 98 mmol/L (ref 98–111)
Creatinine, Ser: 0.82 mg/dL (ref 0.61–1.24)
GFR, Estimated: >60 mL/min
Glucose, Bld: 175 mg/dL — ABNORMAL HIGH (ref 70–99)
Potassium: 3.6 mmol/L (ref 3.5–5.1)
Sodium: 132 mmol/L — ABNORMAL LOW (ref 135–145)
Total Bilirubin: 2.3 mg/dL — ABNORMAL HIGH (ref 0.0–1.2)
Total Protein: 7.4 g/dL (ref 6.5–8.1)

## 2024-08-11 LAB — URINE DRUG SCREEN
Amphetamines: NEGATIVE
Barbiturates: NEGATIVE
Benzodiazepines: NEGATIVE
Cocaine: POSITIVE — AB
Fentanyl: NEGATIVE
Methadone Scn, Ur: NEGATIVE
Opiates: NEGATIVE
Tetrahydrocannabinol: NEGATIVE

## 2024-08-11 LAB — ETHANOL: Alcohol, Ethyl (B): <15 mg/dL

## 2024-08-11 LAB — PROTIME-INR
INR: 1.5 — ABNORMAL HIGH (ref 0.8–1.2)
Prothrombin Time: 18.7 s — ABNORMAL HIGH (ref 11.4–15.2)

## 2024-08-11 LAB — I-STAT CG4 LACTIC ACID, ED: Lactic Acid, Venous: 2.7 mmol/L (ref 0.5–1.9)

## 2024-08-11 LAB — AMMONIA: Ammonia: 53 umol/L — ABNORMAL HIGH (ref 9–35)

## 2024-08-11 LAB — MAGNESIUM: Magnesium: 1.1 mg/dL — ABNORMAL LOW (ref 1.7–2.4)

## 2024-08-11 MED ORDER — SODIUM CHLORIDE 0.9 % IV SOLN
2.0000 g | Freq: Once | INTRAVENOUS | Status: AC
  Administered 2024-08-11: 2 g via INTRAVENOUS
  Filled 2024-08-11: qty 20

## 2024-08-11 MED ORDER — ACETAMINOPHEN 325 MG PO TABS
650.0000 mg | ORAL_TABLET | Freq: Four times a day (QID) | ORAL | Status: DC | PRN
  Administered 2024-08-11: 650 mg via ORAL
  Filled 2024-08-11: qty 2

## 2024-08-11 MED ORDER — SODIUM CHLORIDE 0.9 % IV SOLN: 1.0000 g | INTRAVENOUS | Status: DC

## 2024-08-11 MED ORDER — SODIUM CHLORIDE 0.9 % IV BOLUS
1000.0000 mL | Freq: Once | INTRAVENOUS | Status: AC
  Administered 2024-08-11: 1000 mL via INTRAVENOUS

## 2024-08-11 MED ORDER — OLANZAPINE 10 MG IM SOLR: 2.5000 mg | Freq: Once | INTRAMUSCULAR | Status: DC | PRN

## 2024-08-11 MED ORDER — HEPARIN SODIUM (PORCINE) 5000 UNIT/ML IJ SOLN
5000.0000 [IU] | Freq: Three times a day (TID) | INTRAMUSCULAR | Status: DC
  Administered 2024-08-11: 5000 [IU] via SUBCUTANEOUS
  Filled 2024-08-11: qty 1

## 2024-08-11 MED ORDER — PIPERACILLIN-TAZOBACTAM 3.375 G IVPB
3.3750 g | Freq: Three times a day (TID) | INTRAVENOUS | Status: DC
  Administered 2024-08-12: 3.375 g via INTRAVENOUS
  Filled 2024-08-11: qty 50

## 2024-08-11 MED ORDER — METRONIDAZOLE 500 MG/100ML IV SOLN: 500.0000 mg | Freq: Two times a day (BID) | INTRAVENOUS | Status: DC

## 2024-08-11 MED ORDER — SENNOSIDES-DOCUSATE SODIUM 8.6-50 MG PO TABS: 1.0000 | ORAL_TABLET | Freq: Every evening | ORAL | Status: DC | PRN

## 2024-08-11 MED ORDER — SODIUM CHLORIDE 0.9% FLUSH
3.0000 mL | Freq: Two times a day (BID) | INTRAVENOUS | Status: DC
  Administered 2024-08-11: 3 mL via INTRAVENOUS

## 2024-08-11 MED ORDER — TRAZODONE HCL 100 MG PO TABS
150.0000 mg | ORAL_TABLET | Freq: Every day | ORAL | Status: DC
  Administered 2024-08-11: 150 mg via ORAL
  Filled 2024-08-11: qty 2

## 2024-08-11 MED ORDER — LACTULOSE 10 GM/15ML PO SOLN
10.0000 g | Freq: Three times a day (TID) | ORAL | Status: DC
  Administered 2024-08-11: 10 g via ORAL
  Filled 2024-08-11: qty 30

## 2024-08-11 MED ORDER — APIXABAN 5 MG PO TABS
5.0000 mg | ORAL_TABLET | Freq: Two times a day (BID) | ORAL | Status: DC
  Administered 2024-08-11: 5 mg via ORAL
  Filled 2024-08-11: qty 1

## 2024-08-11 MED ORDER — ACETAMINOPHEN 325 MG PO TABS
650.0000 mg | ORAL_TABLET | Freq: Once | ORAL | Status: AC
  Administered 2024-08-11: 650 mg via ORAL
  Filled 2024-08-11: qty 2

## 2024-08-11 MED ORDER — ACETAMINOPHEN 650 MG RE SUPP: 650.0000 mg | Freq: Four times a day (QID) | RECTAL | Status: DC | PRN

## 2024-08-11 MED ORDER — VANCOMYCIN HCL 2000 MG/400ML IV SOLN
2000.0000 mg | Freq: Once | INTRAVENOUS | Status: AC
  Administered 2024-08-11: 2000 mg via INTRAVENOUS
  Filled 2024-08-11: qty 400

## 2024-08-11 MED ORDER — IOHEXOL 300 MG/ML  SOLN
100.0000 mL | Freq: Once | INTRAMUSCULAR | Status: AC | PRN
  Administered 2024-08-11: 100 mL via INTRAVENOUS

## 2024-08-11 MED ORDER — PANTOPRAZOLE SODIUM 40 MG PO TBEC
40.0000 mg | DELAYED_RELEASE_TABLET | Freq: Two times a day (BID) | ORAL | Status: DC
  Administered 2024-08-12: 40 mg via ORAL
  Filled 2024-08-11: qty 1

## 2024-08-11 NOTE — ED Notes (Signed)
 Unable to get IV access, IV team consult placed.

## 2024-08-11 NOTE — ED Notes (Signed)
 Blood cultures were drawn before antibiotics were given.

## 2024-08-11 NOTE — H&P (Signed)
 " History and Physical    Cory Palmer FMW:[id number] DOB: 1960/11/26 DOA: 08/11/2024  DOS: the patient was seen and examined on 08/11/2024  PCP: Delores Rojelio Caldron, NP   Patient coming from: Home  I have personally briefly reviewed patient's old medical records in [facility] and CareEverywhere  HPI:   Cory Palmer is a 64 y.o. year old male with medical history of hypertension, hyperlipidemia, type 2 diabetes mellitus, hepatic cirrhosis, paroxysmal atrial fibrillation, polysubstance use disorder presenting to the ED with worsening shortness of breath.  Patient states he is not feeling well.  He is unable to elaborate on this.  When asked orientation questions to person, place but not time or situation.  When asked if anything hurts he states his abdomen hurts.  On arrival to the ED patient was noted to be HDS stable.  Lab work and imaging obtained.  CBC without leukocytosis, mild anemia improved from baseline, thrombocytopenia that is stable and chronic.  CMP with mild hyponatremia, moderate hyperglycemia and slight bilirubin elevation.  Respiratory panel negative for COVID, flu, RSV.  Ammonia elevated at 53.  UA without signs of infection.  UA with significant RBCs.  Chest x-ray without any acute findings.  CT abdomen pelvis without contrast shows no acute findings but does show cirrhosis and findings consistent with portal venous hypertension.  CT head without any acute findings but does show some thickening of the sinuses consistent with sinusitis.  Patient met SIRS criteria with reported elevated temperature by EMS to EDP.  Given need for continued care, [facility] contacted for admission.  Review of Systems: As mentioned in the history of present illness. All other systems reviewed and are negative.   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   Stroke [facility])     Past Surgical History:  Procedure Laterality Date   AMPUTATION Left 09/28/2018   Procedure: LEFT TRANSMETATARSAL AMPUTATION;  Surgeon: Harden Jerona GAILS, MD;  Location: [facility] OR;  Service: Orthopedics;  Laterality: Left;   AMPUTATION Left 10/23/2019   Procedure: AMPUTATION BELOW KNEE;  Surgeon: Harden Jerona GAILS, MD;  Location: [facility] OR;  Service: Orthopedics;  Laterality: Left;   BELOW KNEE LEG AMPUTATION Right    ESOPHAGOGASTRODUODENOSCOPY N/A 07/09/2024   Procedure: EGD (ESOPHAGOGASTRODUODENOSCOPY);  Surgeon: Elicia Claw, MD;  Location: [facility];  Service: Gastroenterology;  Laterality: N/A;   ESOPHAGOGASTRODUODENOSCOPY (EGD) WITH PROPOFOL  N/A 10/19/2020   Procedure: ESOPHAGOGASTRODUODENOSCOPY (EGD) WITH PROPOFOL ;  Surgeon: Kristie Lamprey, MD;  Location[facility] ENDOSCOPY;  Service: Endoscopy;  Laterality: N/A;   TEE WITHOUT CARDIOVERSION N/A 10/25/2019   Procedure: TRANSESOPHAGEAL ECHOCARDIOGRAM (TEE);  Surgeon: Pietro Redell RAMAN, MD;  Location: [facility] ENDOSCOPY;  Service: Cardiovascular;  Laterality: N/A;     Allergies[1]  Family History  Problem Relation Age of Onset   Hypertension Other    Diabetes Mellitus II Other     Prior to Admission medications  Medication Sig Start Date End Date Taking? Authorizing Provider  collagenase  (SANTYL ) 250 UNIT/GM ointment Apply topically daily. Apply Santyl  to bilat stump wounds every day, then cover with moist gauze and foam dressings, change foam dressings every 3 days  or as needed soiling 07/10/24   Cheryle Page, MD  doxycycline  (VIBRAMYCIN ) 100 MG capsule Take 1 capsule (100 mg total) by mouth 2 (two) times daily. 07/10/24   Cheryle Page, MD  ondansetron  (ZOFRAN ) 4 MG tablet Take 1 tablet (4 mg total) by mouth every 8 (eight) hours as  needed for nausea or vomiting. 07/10/24   Cheryle Page, MD  pantoprazole  (PROTONIX ) 40 MG tablet Take 1 tablet (40 mg total) by mouth 2 (two) times daily before a meal. 07/10/24   Cheryle Page, MD  traZODone  (DESYREL ) 150 MG tablet Take 150 mg by mouth at bedtime. 04/17/24   [provider]    Social History:  reports that he has been smoking cigarettes. He has been exposed to tobacco smoke. He has never used smokeless tobacco. He reports current alcohol use. He reports that he does not currently use drugs after having used the following drugs: Cocaine.    Physical Exam: Vitals:   08/11/24 1919 08/11/24 2004 08/11/24 2100 08/11/24 2200  BP:  122/67 126/75 137/84  Pulse:  84 92 (!) 102  Resp:  (!) 30 (!) 29 (!) 32  Temp: 98.5 F (36.9 C) 99.3 F (37.4 C)    TempSrc: Oral Oral    SpO2:  97% 98% 99%    Gen: NAD HENT: NCAT CV: Irregularly irregular, good radial pulse Lung: Rhonchi present Abd: No TTP, normal bowel sounds MSK: Bilateral BKA, chronic wound noted on left lower extremity without any acute infection Neuro: Somnolent and oriented to person and place   Labs on Admission: I have personally reviewed following labs and imaging studies  CBC: Recent Labs  Lab 08/11/24 1702  WBC 6.6  NEUTROABS 5.7  HGB 12.9*  HCT 36.6*  MCV 96.3  PLT 77*   Basic Metabolic Panel: Recent Labs  Lab 08/11/24 1702  NA 132*  K 3.6  CL 98  CO2 21*  GLUCOSE 175*  BUN 11  CREATININE 0.82  CALCIUM  8.9   GFR: CrCl cannot be calculated (Unknown ideal weight.). Liver Function Tests: Recent Labs  Lab 08/11/24 1702  AST 36  ALT 14  ALKPHOS 95  BILITOT 2.3*  PROT 7.4  ALBUMIN 3.7   No results for input(s): LIPASE, AMYLASE in the last 168 hours. Recent Labs  Lab 08/11/24 1702  AMMONIA 53*   Coagulation Profile: Recent Labs  Lab 08/11/24 1702  INR 1.5*   Cardiac Enzymes: No results for input(s): CKTOTAL, CKMB, CKMBINDEX, TROPONINI,  TROPONINIHS in the last 168 hours. BNP (last 3 results) Recent Labs    06/01/24 1236  BNP 26.6   HbA1C: No results for input(s): HGBA1C in the last 72 hours. CBG: No results for input(s): GLUCAP in the last 168 hours. Lipid Profile: No results for input(s): CHOL, HDL, LDLCALC, TRIG, CHOLHDL, LDLDIRECT in the last 72 hours. Thyroid Function Tests: No results for input(s): TSH, T4TOTAL, FREET4, T3FREE, THYROIDAB in the last 72 hours. Anemia Panel: No results for input(s): VITAMINB12, FOLATE, FERRITIN, TIBC, IRON, RETICCTPCT in the last 72 hours. Urine analysis:    Component Value Date/Time   COLORURINE AMBER (A) 08/11/2024 1827   APPEARANCEUR CLEAR 08/11/2024 1827   LABSPEC 1.024 08/11/2024 1827   PHURINE 6.0 08/11/2024 1827   GLUCOSEU NEGATIVE 08/11/2024 1827   HGBUR LARGE (A) 08/11/2024 1827   BILIRUBINUR NEGATIVE 08/11/2024 1827   KETONESUR NEGATIVE 08/11/2024 1827   PROTEINUR 30 (A) 08/11/2024 1827   NITRITE NEGATIVE 08/11/2024 1827   LEUKOCYTESUR NEGATIVE 08/11/2024 1827    Radiological  Exams on Admission: I have personally reviewed images CT Head Wo Contrast Result Date: 08/11/2024 CLINICAL DATA:  Memory loss EXAM: CT HEAD WITHOUT CONTRAST TECHNIQUE: Contiguous axial images were obtained from the base of the skull through the vertex without intravenous contrast. RADIATION DOSE REDUCTION: This exam was performed according to the departmental dose-optimization program which includes automated exposure control, adjustment of the mA and/or kV according to patient size and/or use of iterative reconstruction technique. COMPARISON:  03/25/2024 FINDINGS: Brain: No acute infarct or hemorrhage. Lateral ventricles and midline structures are unremarkable. None no acute extra-axial fluid collections. No mass effect. Vascular: No hyperdense vessel or unexpected calcification. Skull: Normal. Negative for fracture or focal lesion. Sinuses/Orbits:  Mucoperiosteal thickening within the left frontal sinus, bilateral ethmoid air cells, and right maxillary sinus. Other: None. IMPRESSION: 1. No acute intracranial process. 2. Frontal, ethmoid, and maxillary sinus disease as above. Electronically Signed   By: Ozell Daring M.D.   On: 08/11/2024 18:58   DG Chest Port 1 View Result Date: 08/11/2024 CLINICAL DATA:  Possible sepsis.  Shortness of breath. EXAM: PORTABLE CHEST 1 VIEW COMPARISON:  03/20/2024, 06/01/2024. FINDINGS: The heart is enlarged and the mediastinal contour is within normal limits. Lung volumes are low. No consolidation, effusion, or pneumothorax is seen. No acute osseous abnormality. IMPRESSION: Low lung volumes with no active disease. Electronically Signed   By: Leita Birmingham M.D.   On: 08/11/2024 16:22   CT ABDOMEN PELVIS WO CONTRAST Result Date: 08/11/2024 CLINICAL DATA:  Acute abdominal pain.  Cirrhosis. EXAM: CT ABDOMEN AND PELVIS WITHOUT CONTRAST TECHNIQUE: Multidetector CT imaging of the abdomen and pelvis was performed following the standard protocol without IV contrast. RADIATION DOSE REDUCTION: This exam was performed according to the departmental dose-optimization program which includes automated exposure control, adjustment of the mA and/or kV according to patient size and/or use of iterative reconstruction technique. COMPARISON:  07/08/2024 FINDINGS: Lower chest: No acute findings. Hepatobiliary: Cirrhosis is again demonstrated. No mass visualized on this unenhanced exam. Recanalization of paraumbilical veins again seen, consistent with portal venous hypertension. No evidence of ascites. Gallbladder is unremarkable. No evidence of biliary ductal dilatation. Pancreas: No mass or inflammatory process visualized on this unenhanced exam. Spleen: Stable mild splenomegaly, consistent with portal venous hypertension. Adrenals/Urinary tract: No evidence of urolithiasis or hydronephrosis. Unremarkable unopacified urinary bladder.  Stomach/Bowel: Limited by motion artifact. No evidence of obstruction, inflammatory process, or abnormal fluid collections. Vascular/Lymphatic: No pathologically enlarged lymph nodes identified. No evidence of abdominal aortic aneurysm. Abdominal portosystemic venous collaterals and esophageal varices again seen, consistent with portal venous hypertension. Reproductive:  No mass or other significant abnormality. Other:  None. Musculoskeletal: No suspicious bone lesions identified. 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 Bilateral L4 pars defects again seen, with degenerative disc disease and 9 mm anterolisthesis at L4-5. IMPRESSION: No acute findings. Cirrhosis and findings of portal venous hypertension. Electronically Signed   By: Norleen DELENA Kil M.D.   On: 08/11/2024 16:21    EKG: My personal interpretation of EKG shows: Atrial fibrillation without any acute ST changes    Assessment/Plan Principal Problem:   Sepsis (HCC) Active Problems:   PAF (paroxysmal atrial fibrillation) (HCC)   Cirrhosis of liver (HCC)   Thrombocytopenia   Diabetes mellitus type II, non insulin  dependent (HCC)   Hypertension   BPH (benign prostatic hyperplasia)   Below-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)   Anemia   GERD (gastroesophageal reflux disease)   Adjustment disorder with mixed anxiety and depressed mood  Patient with likely sepsis given he meets SIRS criteria along with elevated lactic acid.  Source of infection unclear so further workup is pending.  Patient was given ceftriaxone  in the ED.  Blood cultures obtained.  UA without signs of infection.  Will continue ceftriaxone , metronidazole  and vancomycin .  Will change antibiotics as a source of infection becomes apparent.  Will monitor patient's fever curve, blood cultures and leukocyte count.  If workup is reassuring patient could be meeting sepsis criteria in setting of decompensated cirrhosis with failure to clear lactic acid.   Given patient is unable to provide any significant history we will await further imaging data prior to just continuing antibiotics.  Procalcitonin ordered as well.  Patient previously had cellulitis of left lower extremity but it does not appear to be acutely infected.  Paroxysmal atrial fibrillation: Patient currently in A-fib.  He is on Eliquis  and has a recent fill on this medication.  Will continue his Eliquis  here.  Acute encephalopathy: Ammonia level checked and elevated.  Will start patient on lactulose .  This is likely leading to his encephalopathy.  Thrombocytopenia: Chronic and stable.  Continue to monitor.  Poor SDOH: Limiting factor for the patient as he is unable to follow-up with outpatient providers and does not have stable housing.  Will consult TOC to see if they can provide any assistance.  UDS ordered given his polysubstance use.  GERD: Continue home PPI.  EGD performed last month showed patient had significant erosive esophagitis along with multiple duodenal ulcers.  Patient will need to follow-up with GI in the outpatient setting.  Cirrhosis: MELD score 19.  Child class B.  Decompensated given encephalopathy.  Overall this is complicated by his at un-homed status and polysubstance use.  BPH: Not on any pharmacotherapy.  Will order every shift bladder scans.   VTE prophylaxis:  Eliquis   Diet: N.p.o. Code Status:  Full Code Telemetry:  Admission status: Observation, Telemetry bed Patient is from: Home Anticipated d/c is to: Home Anticipated d/c is in: 1-2 days   Family Communication: Updated at bedside  Consults called: None   Severity of Illness: The appropriate patient status for this patient is OBSERVATION. Observation status is judged to be reasonable and necessary in order to provide the required intensity of service to ensure the patient's safety. The patient's presenting symptoms, physical exam findings, and initial radiographic and laboratory data in the  context of their medical condition is felt to place them at decreased risk for further clinical deterioration. Furthermore, it is anticipated that the patient will be medically stable for discharge from the hospital within 2 midnights of admission.    Morene Bathe, MD Jol[facility] DEL. [facility]     [1]  Allergies Allergen Reactions   Atorvastatin Calcium  Nausea And Vomiting   Gemfibrozil Other (See Comments)    Pt cannot recall symptoms   "

## 2024-08-11 NOTE — ED Provider Notes (Signed)
 " [place] EMERGENCY DEPARTMENT AT [facility] Provider Note   CSN: [id number] Arrival date & time: 08/11/24  1521     Patient presents with: Shortness of Breath   Cory Palmer is a 64 y.o. male.  {Add pertinent medical, surgical, social history, OB history to YEP:67052} Patient complains of cough shortness of breath and weakness.  Patient has a history of diabetes and bilateral below the knee amputations with cirrhosis.  Patient had a elevated temperature per paramedics   Shortness of Breath      Prior to Admission medications  Medication Sig Start Date End Date Taking? Authorizing Provider  collagenase  (SANTYL ) 250 UNIT/GM ointment Apply topically daily. Apply Santyl  to bilat stump wounds every day, then cover with moist gauze and foam dressings, change foam dressings every 3 days or as needed soiling 07/10/24   Cheryle Page, MD  doxycycline  (VIBRAMYCIN ) 100 MG capsule Take 1 capsule (100 mg total) by mouth 2 (two) times daily. 07/10/24   Cheryle Page, MD  ondansetron  (ZOFRAN ) 4 MG tablet Take 1 tablet (4 mg total) by mouth every 8 (eight) hours as needed for nausea or vomiting. 07/10/24   Cheryle Page, MD  pantoprazole  (PROTONIX ) 40 MG tablet Take 1 tablet (40 mg total) by mouth 2 (two) times daily before a meal. 07/10/24   Cheryle Page, MD  traZODone  (DESYREL ) 150 MG tablet Take 150 mg by mouth at bedtime. 04/17/24   [provider]    Allergies: Atorvastatin calcium  and Gemfibrozil    Review of Systems  Respiratory:  Positive for shortness of breath.     Updated Vital Signs BP (!) 155/91   Pulse 98   Temp 98.5 F (36.9 C) (Oral)   Resp 20   SpO2 94%   Physical Exam  (all labs ordered are listed, but only abnormal results are displayed) Labs Reviewed  COMPREHENSIVE METABOLIC PANEL WITH GFR - Abnormal; Notable for the following components:      Result Value   Sodium 132 (*)    CO2 21 (*)    Glucose, Bld 175 (*)    Total Bilirubin  2.3 (*)    All other components within normal limits  CBC WITH DIFFERENTIAL/PLATELET - Abnormal; Notable for the following components:   RBC 3.80 (*)    Hemoglobin 12.9 (*)    HCT 36.6 (*)    Platelets 77 (*)    Lymphs Abs 0.4 (*)    All other components within normal limits  PROTIME-INR - Abnormal; Notable for the following components:   Prothrombin Time 18.7 (*)    INR 1.5 (*)    All other components within normal limits  URINALYSIS, W/ REFLEX TO CULTURE (INFECTION SUSPECTED) - Abnormal; Notable for the following components:   Color, Urine AMBER (*)    Hgb urine dipstick LARGE (*)    Protein, ur 30 (*)    All other components within normal limits  AMMONIA - Abnormal; Notable for the following components:   Ammonia 53 (*)    All other components within normal limits  I-STAT CG4 LACTIC ACID, ED - Abnormal; Notable for the following components:   Lactic Acid, Venous 2.7 (*)    All other components within normal limits  RESP PANEL BY RT-PCR (RSV, FLU A&B, COVID)  RVPGX2  CULTURE, BLOOD (ROUTINE X 2)  CULTURE, BLOOD (ROUTINE X 2)  ETHANOL  I-STAT CG4 LACTIC ACID, ED    EKG: None  Radiology: CT Head Wo Contrast Result Date: 08/11/2024 CLINICAL DATA:  Memory loss EXAM: CT HEAD WITHOUT CONTRAST TECHNIQUE: Contiguous axial images were obtained from the base of the skull through the vertex without intravenous contrast. RADIATION DOSE REDUCTION: This exam was performed according to the departmental dose-optimization program which includes automated exposure control, adjustment of the mA and/or kV according to patient size and/or use of iterative reconstruction technique. COMPARISON:  03/25/2024 FINDINGS: Brain: No acute infarct or hemorrhage. Lateral ventricles and midline structures are unremarkable. None no acute extra-axial fluid collections. No mass effect. Vascular: No hyperdense vessel or unexpected calcification. Skull: Normal. Negative for fracture or focal lesion. Sinuses/Orbits:  Mucoperiosteal thickening within the left frontal sinus, bilateral ethmoid air cells, and right maxillary sinus. Other: None. IMPRESSION: 1. No acute intracranial process. 2. Frontal, ethmoid, and maxillary sinus disease as above. Electronically Signed   By: Ozell Daring M.D.   On: 08/11/2024 18:58   DG Chest Port 1 View Result Date: 08/11/2024 CLINICAL DATA:  Possible sepsis.  Shortness of breath. EXAM: PORTABLE CHEST 1 VIEW COMPARISON:  03/20/2024, 06/01/2024. FINDINGS: The heart is enlarged and the mediastinal contour is within normal limits. Lung volumes are low. No consolidation, effusion, or pneumothorax is seen. No acute osseous abnormality. IMPRESSION: Low lung volumes with no active disease. Electronically Signed   By: Leita Birmingham M.D.   On: 08/11/2024 16:22   CT ABDOMEN PELVIS WO CONTRAST Result Date: 08/11/2024 CLINICAL DATA:  Acute abdominal pain.  Cirrhosis. EXAM: CT ABDOMEN AND PELVIS WITHOUT CONTRAST TECHNIQUE: Multidetector CT imaging of the abdomen and pelvis was performed following the standard protocol without IV contrast. RADIATION DOSE REDUCTION: This exam was performed according to the departmental dose-optimization program which includes automated exposure control, adjustment of the mA and/or kV according to patient size and/or use of iterative reconstruction technique. COMPARISON:  07/08/2024 FINDINGS: Lower chest: No acute findings. Hepatobiliary: Cirrhosis is again demonstrated. No mass visualized on this unenhanced exam. Recanalization of paraumbilical veins again seen, consistent with portal venous hypertension. No evidence of ascites. Gallbladder is unremarkable. No evidence of biliary ductal dilatation. Pancreas: No mass or inflammatory process visualized on this unenhanced exam. Spleen: Stable mild splenomegaly, consistent with portal venous hypertension. Adrenals/Urinary tract: No evidence of urolithiasis or hydronephrosis. Unremarkable unopacified urinary bladder.  Stomach/Bowel: Limited by motion artifact. No evidence of obstruction, inflammatory process, or abnormal fluid collections. Vascular/Lymphatic: No pathologically enlarged lymph nodes identified. No evidence of abdominal aortic aneurysm. Abdominal portosystemic venous collaterals and esophageal varices again seen, consistent with portal venous hypertension. Reproductive:  No mass or other significant abnormality. Other:  None. Musculoskeletal: No suspicious bone lesions identified. 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 Bilateral L4 pars defects again seen, with degenerative disc disease and 9 mm anterolisthesis at L4-5. IMPRESSION: No acute findings. Cirrhosis and findings of portal venous hypertension. Electronically Signed   By: Norleen DELENA Kil M.D.   On: 08/11/2024 16:21    {Document cardiac monitor, telemetry assessment procedure when appropriate:32947} Procedures   Medications Ordered in the ED  sodium chloride  0.9 % bolus 1,000 mL (1,000 mLs Intravenous New Bag/Given 08/11/24 1655)  cefTRIAXone  (ROCEPHIN ) 2 g in sodium chloride  0.9 % 100 mL IVPB (0 g Intravenous Stopped 08/11/24 1724)  acetaminophen  (TYLENOL ) tablet 650 mg (650 mg Oral Given 08/11/24 1655)  sodium chloride  0.9 % bolus 1,000 mL (1,000 mLs Intravenous New Bag/Given 08/11/24 1806)      {Click here for ABCD2, HEART and other calculators REFRESH Note before signing:1}  Medical Decision Making Amount and/or Complexity of Data Reviewed Labs: ordered. Radiology: ordered. ECG/medicine tests: ordered.  Risk OTC drugs. Decision regarding hospitalization.   Patient with cirrhosis and respiratory infection and full sepsis with hematuria.  He will be admitted to medicine  {Document critical care time when appropriate  Document review of labs and clinical decision tools ie CHADS2VASC2, etc  Document your independent review of radiology images and any outside records  Document your  discussion with family members, caretakers and with consultants  Document social determinants of health affecting pt's care  Document your decision making why or why not admission, treatments were needed:32947:::1}   Final diagnoses:  SIRS (systemic inflammatory response syndrome) (HCC)  Alcoholic cirrhosis of liver without ascites [facility])    ED Discharge Orders     None        "

## 2024-08-11 NOTE — ED Triage Notes (Signed)
 [facility] reports pt coming from [facility] for [facility] for the past few days. Pt is homeless. Covered in urine. Pt is homeless. Aox2 per EMS.

## 2024-08-11 NOTE — Sepsis Progress Note (Signed)
Code sepsis is being followed by [facility]

## 2024-08-11 NOTE — Progress Notes (Signed)
 Pharmacy Antibiotic Note  Cory Palmer is a 64 y.o. male admitted on 08/11/2024 with sepsis.  Pharmacy has been consulted for vancomycin  dosing.  Plan: Vancomycin  2gm IV x 1 then 1gm q12h (AUC 468.9, Scr 0.82) Follow renal function and clinical course  Weight: 110.1 kg (242 lb 11.6 oz)  Temp (24hrs), Avg:99.5 F (37.5 C), Min:97.8 F (36.6 C), Max:102.3 F (39.1 C)  Recent Labs  Lab 08/11/24 1702 08/11/24 1715  WBC 6.6  --   CREATININE 0.82  --   LATICACIDVEN  --  2.7*    Estimated Creatinine Clearance: 109.5 mL/min (by C-G formula based on SCr of 0.82 mg/dL).    Allergies[1]  Antimicrobials this admission: 1/11 CTX x 1 1/12 vanc >> 1/12 zosyn  >>  Dose adjustments this admission:   Microbiology results: 1/11 BCx:  Thank you for allowing pharmacy to be a part of this patients care.  Leeroy Mace RPh 08/11/2024, 11:17 PM       [1]  Allergies Allergen Reactions   Atorvastatin Calcium  Nausea And Vomiting   Gemfibrozil Other (See Comments)    Pt cannot recall symptoms

## 2024-08-12 ENCOUNTER — Ambulatory Visit: Admitting: Physician Assistant

## 2024-08-12 DIAGNOSIS — D696 Thrombocytopenia, unspecified: Secondary | ICD-10-CM

## 2024-08-12 LAB — BLOOD CULTURE ID PANEL (REFLEXED) - BCID2

## 2024-08-12 LAB — CBC
HCT: 32.1 % — ABNORMAL LOW (ref 39.0–52.0)
Hemoglobin: 10.7 g/dL — ABNORMAL LOW (ref 13.0–17.0)
MCH: 33 pg (ref 26.0–34.0)
MCHC: 33.3 g/dL (ref 30.0–36.0)
MCV: 99.1 fL (ref 80.0–100.0)
Platelets: 54 K/uL — ABNORMAL LOW (ref 150–400)
RBC: 3.24 MIL/uL — ABNORMAL LOW (ref 4.22–5.81)
RDW: 13.6 % (ref 11.5–15.5)
WBC: 4 K/uL (ref 4.0–10.5)
nRBC: 0 % (ref 0.0–0.2)

## 2024-08-12 LAB — RESPIRATORY PANEL BY PCR

## 2024-08-12 LAB — BASIC METABOLIC PANEL WITH GFR
Anion gap: 12 (ref 5–15)
BUN: 12 mg/dL (ref 8–23)
CO2: 18 mmol/L — ABNORMAL LOW (ref 22–32)
Calcium: 7.6 mg/dL — ABNORMAL LOW (ref 8.9–10.3)
Chloride: 103 mmol/L (ref 98–111)
Creatinine, Ser: 0.79 mg/dL (ref 0.61–1.24)
GFR, Estimated: >60 mL/min
Glucose, Bld: 119 mg/dL — ABNORMAL HIGH (ref 70–99)
Potassium: 3.2 mmol/L — ABNORMAL LOW (ref 3.5–5.1)
Sodium: 133 mmol/L — ABNORMAL LOW (ref 135–145)

## 2024-08-12 LAB — CBG MONITORING, ED: Glucose-Capillary: 120 mg/dL — ABNORMAL HIGH (ref 70–99)

## 2024-08-12 LAB — PROCALCITONIN: Procalcitonin: 0.22 ng/mL

## 2024-08-12 LAB — MAGNESIUM: Magnesium: 1.7 mg/dL (ref 1.7–2.4)

## 2024-08-12 MED ORDER — VANCOMYCIN HCL IN DEXTROSE 1-5 GM/200ML-% IV SOLN: 1000.0000 mg | Freq: Two times a day (BID) | INTRAVENOUS | Status: DC

## 2024-08-12 MED ORDER — SODIUM CHLORIDE 0.9 % IV BOLUS
1000.0000 mL | Freq: Once | INTRAVENOUS | Status: AC
  Administered 2024-08-12: 1000 mL via INTRAVENOUS

## 2024-08-12 MED ORDER — SODIUM CHLORIDE 0.9 % IV SOLN: INTRAVENOUS | Status: DC

## 2024-08-12 MED ORDER — MAGNESIUM SULFATE 2 GM/50ML IV SOLN
2.0000 g | Freq: Once | INTRAVENOUS | Status: AC
  Administered 2024-08-12: 2 g via INTRAVENOUS
  Filled 2024-08-12: qty 50

## 2024-08-12 NOTE — ED Notes (Signed)
 Dr Rosario aware pt left AMA.

## 2024-08-12 NOTE — ED Provider Notes (Signed)
 Patient left AMA while boarding in the ED for hospitalist admission (per the charge nurse). I was not involved in his care or made aware prior to him leaving the hospital.   Freddi Hamilton, MD 08/12/24 1256

## 2024-08-12 NOTE — Progress Notes (Signed)
" ° °  Brief Progress Note   _____________________________________________________________________________________________________________  Patient Name: Cory Palmer Patient DOB: 04/15/1961 Date: @TODAY @      Data: 64 yo signed out AMA, PMH: DM type 2, HTN, HLD, cirrhosis, PAF, polysubstance abuse, being treated for sepsis.  Blood cultures growing GPC, BCID showing MRSA.  Attempted to call pt, phone number listed went to a friend, brothers phone went to voice mail.    Action: Attempted call.    Response:  No response.    _____________________________________________________________________________________________________________  The [facility] RN Expeditor Sharolyn JONETTA Batman Please contact us  directly via secure chat (search for [facility]) or by calling us  at [phone] [facility]).  "

## 2024-08-12 NOTE — Progress Notes (Signed)
 Notified by nurse that patient's systolic blood pressure has decreased into the 70-80 range.  Tmax 102.3 or earlier this evening.  Currently being treated for sepsis of unknown etiology.  Cultures are pending.  Initial CT chest abdomen and pelvis was unremarkable.  On empiric broad-spectrum antibiotics.  Previously received 1 L bolus saline while in the care of the EDP.  I have ordered another liter of saline to be followed by gentle saline infusion at 150 cc/h.  Upon review of recent follow-up labs initial lactic acid was 2.7 with a follow-up of 1.7.  Procalcitonin was minimally elevated at 0.22.  Urinalysis unremarkable except for hematuria.  Magnesium  is low at 1.4 and replacement has been ordered.  Earlier this evening while the patient was sleeping his O2 sats decreased to 81% but when awake his oxygen increased to 96%.  O2 applied for use while sleeping only.  At this time no indication to perform another chest x-ray noting recent CT of the chest negative.  Continue to monitor for improvement or worsening in blood pressure readings.

## 2024-08-12 NOTE — Consult Note (Incomplete)
 "        [facility]    Date of Admission:  08/11/2024   Total days of inpatient antibiotics ***        Reason for Consult: ***    Principal Problem:   Sepsis (HCC) Active Problems:   Diabetes mellitus type II, non insulin  dependent (HCC)   Hypertension   PAF (paroxysmal atrial fibrillation) (HCC)   BPH (benign prostatic hyperplasia)   Below-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)   Cirrhosis of liver (HCC)   Anemia   GERD (gastroesophageal reflux disease)   Thrombocytopenia   Adjustment disorder with mixed anxiety and depressed mood   Assessment: 64 year old male with history of polysubstance abuse, A-fib on Eliquis , bilateral BKA, homelessness, chronic SDH, history of MRSA bacteremia in August 25 left AMA with subsequent hospitalized for stump cellulitis/GI bleed pleated treatment with Doxy outpatient in December 2025 presents with worsening shortness of breath found to have #MRSA bacteremia - Blood cultures grew 1 out of 1 MRSA - Since August 2020 5 repeat blood cultures in November 2025-with no growth - On arrival patient had temp of 102, WBC 4K.  CT chest abdomen pelvis showed no acute abnormalities. #Cirrhosis complicated by encephalopathy  #Polysubstance abuse - UDS positive for cocaine on 08/11/2024 Recommendations:  Stop pip-tazo - Repeat blood cultures tomorrow - TTE - Not a home IV antibiotic Given polysubstance abuse history Microbiology:   Antibiotics: Vancomycin  and pip-tazo  Cultures: Blood 1/11 1 out of 1 MRSA Urine  Other 12/12 respiratory cultures no growth  HPI: Cory Palmer is a 64 y.o. male with past medical history of alcohol abuse, bilateral BKA, A-fib on Eliquis , frequent falls, homelessness, polysubstance abuse, chronic SDH, history of MRSA bacteremia in August 2025 she was started on vancomycin  but left AMA on 8/28 since then has had multiple ED visits and a hospitalization back in December 2025 for  possible GI bleed with left stump cellulitis treated with vancomycin  and patient doxycycline  outpatient.  Presented to the ED for shortness of breath.  Found to have temp of 102.3, WBC 4K.  ID engaged as blood cultures grew MRSA.  Review of Systems: ROS  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   Stroke [facility])     Social History[1]  Family History  Problem Relation Age of Onset   Hypertension Other    Diabetes Mellitus II Other    Scheduled Meds:  apixaban   5 mg Oral BID   lactulose   10 g Oral TID   pantoprazole   40 mg Oral BID AC   sodium chloride  flush  3 mL Intravenous Q12H   traZODone   150 mg Oral QHS   Continuous Infusions:  sodium chloride  150 mL/hr at 08/12/24 0334   piperacillin -tazobactam (ZOSYN )  IV 3.375 g (08/12/24 0305)   vancomycin      PRN Meds:.acetaminophen  **OR** acetaminophen , OLANZapine , senna-docusate Allergies[2]  OBJECTIVE: Blood pressure (!) 109/55, pulse 78, temperature 98.7 F (37.1 C), temperature source Oral, resp. rate (!) 22, weight 110.1 kg, SpO2 97%.  Physical Exam  Lab Results Lab Results  Component Value Date   WBC 4.0  08/12/2024   HGB 10.7 (L) 08/12/2024   HCT 32.1 (L) 08/12/2024   MCV 99.1 08/12/2024   PLT 54 (L) 08/12/2024    Lab Results  Component Value Date   CREATININE 0.79 08/12/2024   BUN 12 08/12/2024   NA 133 (L) 08/12/2024   K 3.2 (L) 08/12/2024   CL 103 08/12/2024   CO2 18 (L) 08/12/2024    Lab Results  Component Value Date   ALT 14 08/11/2024   AST 36 08/11/2024   ALKPHOS 95 08/11/2024   BILITOT 2.3 (H) 08/11/2024       Cory Stank, MD [facility] 08/12/2024, 9:56 AM     [1]  Social History Tobacco Use   Smoking status: Every Day    Current packs/day: 0.25    Types: Cigarettes    Passive exposure: Current   Smokeless tobacco: Never  Vaping Use   Vaping status: Never Used  Substance Use Topics   Alcohol use: Yes   Drug use: Not Currently    Types: Cocaine    Comment: Last used in July 2023  [2]  Allergies Allergen Reactions   Atorvastatin Calcium  Nausea And Vomiting   Gemfibrozil Other (See Comments)    Pt cannot recall symptoms   "

## 2024-08-12 NOTE — ED Notes (Signed)
 Pt's oxygen level dropped to 81% on room air when he was sleeping. Pt woke up and O2 increased to 96% but this RN placed him on 2L [place] while he sleeps.

## 2024-08-12 NOTE — Discharge Summary (Signed)
 Physician Discharge Summary  Patient ID: [name] MRN: [id number] DOB/AGE: Jan 11, 1961 64 y.o.  Admit date: 08/11/2024 Discharge date: 08/12/2024  Admission Diagnoses:  Discharge Diagnoses:  Principal Problem:   Sepsis (HCC) Active Problems:   Diabetes mellitus type II, non insulin  dependent (HCC)   Hypertension   PAF (paroxysmal atrial fibrillation) (HCC)   BPH (benign prostatic hyperplasia)   Below-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)   Cirrhosis of liver (HCC)   Anemia   GERD (gastroesophageal reflux disease)   Thrombocytopenia   Adjustment disorder with mixed anxiety and depressed mood   Patient left the hospital AGAINST MEDICAL ADVICE.       SignedBETHA Leatrice LILLETTE Rosario 08/12/2024, 2:19 PM

## 2024-08-12 NOTE — Progress Notes (Signed)
" ° °  Brief Progress Note   _____________________________________________________________________________________________________________  Patient Name: Cory Palmer Patient DOB: 08/12/1960 Date: @TODAY @      Data: Reviewed notes, labs, VS.    Action: No action needed at this time.     Response:    _____________________________________________________________________________________________________________  The [facility] RN Expeditor Sharolyn JONETTA Batman Please contact us  directly via secure chat (search for [facility]) or by calling us  at [phone] [facility]).  "

## 2024-08-12 NOTE — ED Notes (Signed)
 Patient said,  I have to leave, [facility], there giving me a house. Staff explain the risk of leaving AMA patient stated he understood but he still wanted to leave.

## 2024-08-12 NOTE — Progress Notes (Signed)
 PHARMACY - PHYSICIAN COMMUNICATION CRITICAL VALUE ALERT - BLOOD CULTURE IDENTIFICATION (BCID)  Cory Palmer is an 64 y.o. male who presented to [facility] on 08/11/2024 with a chief complaint of worsening ShOB, sepsis.   Assessment:  1/2 Bcx bottles growing GPC, BCID showing MRSA   Name of physician (or Provider) Contacted: Dr. Rosario  Current antibiotics: Zosyn , vancomycin   Changes to prescribed antibiotics recommended:  Continue vancomycin . Consider stopping Zosyn  given CT a/p, CT chest, and UA are all unremarkable  Results for orders placed or performed during the hospital encounter of 03/24/24  Blood Culture ID Panel (Reflexed) (Collected: 03/24/2024  1:42 PM)  Result Value Ref Range   Enterococcus faecalis NOT DETECTED NOT DETECTED   Enterococcus Faecium NOT DETECTED NOT DETECTED   Listeria monocytogenes NOT DETECTED NOT DETECTED   Staphylococcus species DETECTED (A) NOT DETECTED   Staphylococcus aureus (BCID) DETECTED (A) NOT DETECTED   Staphylococcus epidermidis NOT DETECTED NOT DETECTED   Staphylococcus lugdunensis NOT DETECTED NOT DETECTED   Streptococcus species NOT DETECTED NOT DETECTED   Streptococcus agalactiae NOT DETECTED NOT DETECTED   Streptococcus pneumoniae NOT DETECTED NOT DETECTED   Streptococcus pyogenes NOT DETECTED NOT DETECTED   A.calcoaceticus-baumannii NOT DETECTED NOT DETECTED   Bacteroides fragilis NOT DETECTED NOT DETECTED   Enterobacterales NOT DETECTED NOT DETECTED   Enterobacter cloacae complex NOT DETECTED NOT DETECTED   Escherichia coli NOT DETECTED NOT DETECTED   Klebsiella aerogenes NOT DETECTED NOT DETECTED   Klebsiella oxytoca NOT DETECTED NOT DETECTED   Klebsiella pneumoniae NOT DETECTED NOT DETECTED   Proteus species NOT DETECTED NOT DETECTED   Salmonella species NOT DETECTED NOT DETECTED   Serratia marcescens NOT DETECTED NOT DETECTED   Haemophilus influenzae NOT DETECTED NOT DETECTED   Neisseria meningitidis NOT DETECTED NOT DETECTED    Pseudomonas aeruginosa NOT DETECTED NOT DETECTED   Stenotrophomonas maltophilia NOT DETECTED NOT DETECTED   Candida albicans NOT DETECTED NOT DETECTED   Candida auris NOT DETECTED NOT DETECTED   Candida glabrata NOT DETECTED NOT DETECTED   Candida krusei NOT DETECTED NOT DETECTED   Candida parapsilosis NOT DETECTED NOT DETECTED   Candida tropicalis NOT DETECTED NOT DETECTED   Cryptococcus neoformans/gattii NOT DETECTED NOT DETECTED   Meth resistant mecA/C and MREJ DETECTED (A) NOT DETECTED    Lacinda Moats, PharmD Clinical Pharmacist  1/12/20269:22 AM

## 2024-08-13 ENCOUNTER — Encounter: Payer: Self-pay | Admitting: Physician Assistant

## 2024-08-13 ENCOUNTER — Ambulatory Visit: Admitting: Physician Assistant

## 2024-08-13 DIAGNOSIS — Z89511 Acquired absence of right leg below knee: Secondary | ICD-10-CM | POA: Diagnosis not present

## 2024-08-13 DIAGNOSIS — Z89512 Acquired absence of left leg below knee: Secondary | ICD-10-CM | POA: Diagnosis not present

## 2024-08-13 NOTE — Progress Notes (Addendum)
 "  Office Visit Note   Patient: Cory Palmer           Date of Birth: 02-09-1961           MRN: [id number] Visit Date: 08/13/2024              Requested by: Delores Rojelio Caldron, NP [address] [place],  [place] [address] PCP: Delores Rojelio Caldron, NP  Chief Complaint  Patient presents with   Right Leg - Follow-up   Left Leg - Follow-up      HPI: 64 y/o male is here today for exam.  He has B BKA.  He has a left stump wound with scab.  He has a strong desire to get a power wheel chair.  He has used a manual WC for years his first amputation right BKA was 09/28/2018 and the right was done on 10/23/2019. He becomes short of breath trying to manually push himself around after a block.  He has decreased shoulder motion with pain.  He has to crawl out of his chair to find get to sleep.  He is homeless and finds himself sleeping under shelters outside on the ground.  A power chair would be move beneficial for mobility and could possibly recline to sleep in the chair.  Currently he is dependent on a manual WC for mobility and would benefit from a power chair.  [facility] and finding shelter and food.    Assessment & Plan: Visit Diagnoses:  1. S/P bilateral below knee amputation (HCC)     Plan:  Clean the left stump daily with soap and water.   Bilateral BKA  Fatigue & Pain: Eliminates repetitive pushing, protecting shoulders and arms from strain, pain, or overuse injuries, making all-day use feasible.  Limited Strength/Endurance: bilateral arm strength, coordination, or stamina to propel a manual chair, especially with conditions like muscle weakness is present.  I recommend he get a power wheel chair.    Follow-Up Instructions: Return if symptoms worsen or fail to improve.   Ortho Exam  Patient is alert, oriented, no adenopathy, well-dressed, normal affect, normal respiratory effort. Pain in bilateral shoulders with forward flexion adduction and  abduction.  Weakness in his hand strength 4-/5.      Imaging: No results found. No images are attached to the encounter.  Labs: Lab Results  Component Value Date   HGBA1C 5.7 (H) 03/24/2024   HGBA1C 6.6 12/28/2022   HGBA1C 6.5 (H) 08/26/2022   ESRSEDRATE 10 03/24/2022   ESRSEDRATE 34 (H) 08/15/2019   ESRSEDRATE 31 (H) 08/05/2019   CRP 5.8 (H) 03/24/2024   CRP 6.5 (H) 03/24/2022   CRP 15.0 (H) 08/15/2019   REPTSTATUS PENDING 08/11/2024   GRAMSTAIN  11/19/2019    RARE WBC PRESENT, PREDOMINANTLY PMN RARE GRAM POSITIVE COCCI    CULT (A) 08/11/2024    STAPHYLOCOCCUS AUREUS SUSCEPTIBILITIES TO FOLLOW Performed at [facility], [address] [place], [place] [address]    LABORGA METHICILLIN RESISTANT STAPHYLOCOCCUS AUREUS 03/24/2024     Lab Results  Component Value Date   ALBUMIN 3.7 08/11/2024   ALBUMIN 3.6 07/08/2024   ALBUMIN 3.2 (L) 07/05/2024   PREALBUMIN 11.2 (L) 05/08/2018    Lab Results  Component Value Date   MG 1.7 08/12/2024   MG 1.1 (L) 08/11/2024   MG 1.7 07/10/2024   No results found for: VD25OH  Lab Results  Component Value Date   PREALBUMIN 11.2 (  L) 05/08/2018      Latest Ref Rng & Units 08/12/2024    5:30 AM 08/11/2024    5:02 PM 07/10/2024    4:29 AM  CBC EXTENDED  WBC 4.0 - 10.5 K/uL 4.0  6.6  4.1   RBC 4.22 - 5.81 MIL/uL 3.24  3.80  3.06   Hemoglobin 13.0 - 17.0 g/dL 89.2  87.0  89.6   HCT 39.0 - 52.0 % 32.1  36.6  29.1   Platelets 150 - 400 K/uL 54  77  85   NEUT# 1.7 - 7.7 K/uL  5.7  2.5   Lymph# 0.7 - 4.0 K/uL  0.4  0.9      There is no height or weight on file to calculate BMI.  Orders:  No orders of the defined types were placed in this encounter.  No orders of the defined types were placed in this encounter.    Procedures: No procedures performed  Clinical Data: No additional findings.  ROS:  All other systems negative, except as noted in the HPI. Review of Systems  Objective: Vital Signs: There were  no vitals taken for this visit.  Specialty Comments:  No specialty comments available.  PMFS History: Patient Active Problem List   Diagnosis Date Noted   Upper GI bleed 07/08/2024   Traumatic subdural hematoma (SDH) (HCC) 03/24/2024   GAD (generalized anxiety disorder) 06/08/2023   Acute pulmonary embolism (HCC) 12/13/2022   Tobacco use 12/13/2022   Substance induced mood disorder (HCC) 11/08/2022   Pneumonia 08/26/2022   Suicide ideation 06/26/2022   Alcohol abuse 04/10/2022   Cocaine abuse with cocaine-induced mood disorder (HCC) 04/10/2022   Adjustment disorder with mixed anxiety and depressed mood 04/10/2022   Alcohol-induced mood disorder (HCC) 04/10/2022   Severe sepsis (HCC) 03/24/2022   Abdominal pain 03/24/2022   Nausea vomiting and diarrhea 03/24/2022   Prolonged QT interval 03/24/2022   Macrocytosis without anemia 03/24/2022   Sepsis (HCC) 03/24/2022   Acute hepatic encephalopathy (HCC) 11/17/2021   PVD (peripheral vascular disease) 11/17/2021   Metabolic acidosis 11/17/2021   Esophagitis 11/17/2021   Acute metabolic encephalopathy 11/11/2021   Thrombocytopenia 11/11/2021   Leukocytosis 10/10/2020   GERD (gastroesophageal reflux disease) 10/10/2020   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HCC) 05/06/2020   Hyponatremia 10/23/2019   MRSA bacteremia 10/21/2019   Atrial flutter with rapid ventricular response (HCC) 10/21/2019   Lactic acidosis 10/21/2019   Severe protein-calorie malnutrition    Diabetic polyneuropathy associated with type 2 diabetes mellitus (HCC)    Cirrhosis of liver (HCC) 10/19/2019   Anemia 10/19/2019   Medication monitoring encounter 08/15/2019   Serum total bilirubin elevated 10/29/2018   BPH (benign prostatic hyperplasia) 10/29/2018   Below-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)    PAF (paroxysmal atrial fibrillation) (HCC)    Hypokalemia    Hypomagnesemia    Sepsis without acute organ dysfunction (HCC) 10/05/2018    Depressed bipolar disorder (HCC) 10/05/2018   Hypertension 10/05/2018   Hyperbilirubinemia 10/05/2018   Homelessness 05/07/2018   Diabetes mellitus type II, non insulin  dependent (HCC) 05/07/2018   Past Medical History:  Diagnosis Date   Anxiety    Depressed bipolar disorder (HCC)    Diabetes mellitus without complication (HCC)    Hypertension    Liver cirrhosis (HCC)    Osteomyelitis of left foot (HCC) 10/19/2019   Osteomyelitis of toe of left foot (HCC)    PAF (paroxysmal atrial fibrillation) (HCC)    S/P transmetatarsal amputation of foot, left (  HCC) 09/28/2018   Stroke [facility])     Family History  Problem Relation Age of Onset   Hypertension Other    Diabetes Mellitus II Other     Past Surgical History:  Procedure Laterality Date   AMPUTATION Left 09/28/2018   Procedure: LEFT TRANSMETATARSAL AMPUTATION;  Surgeon: Harden Jerona GAILS, MD;  Location: [facility] OR;  Service: Orthopedics;  Laterality: Left;   AMPUTATION Left 10/23/2019   Procedure: AMPUTATION BELOW KNEE;  Surgeon: Harden Jerona GAILS, MD;  Location: [facility] OR;  Service: Orthopedics;  Laterality: Left;   BELOW KNEE LEG AMPUTATION Right    ESOPHAGOGASTRODUODENOSCOPY N/A 07/09/2024   Procedure: EGD (ESOPHAGOGASTRODUODENOSCOPY);  Surgeon: Elicia Claw, MD;  Location: [facility];  Service: Gastroenterology;  Laterality: N/A;   ESOPHAGOGASTRODUODENOSCOPY (EGD) WITH PROPOFOL  N/A 10/19/2020   Procedure: ESOPHAGOGASTRODUODENOSCOPY (EGD) WITH PROPOFOL ;  Surgeon: Kristie Lamprey, MD;  Location: [facility] ENDOSCOPY;  Service: Endoscopy;  Laterality: N/A;   TEE WITHOUT CARDIOVERSION N/A 10/25/2019   Procedure: TRANSESOPHAGEAL ECHOCARDIOGRAM (TEE);  Surgeon: Pietro Redell RAMAN, MD;  Location: [facility] ENDOSCOPY;  Service: Cardiovascular;  Laterality: N/A;   Social History   Occupational History   Not on file  Tobacco Use   Smoking status: Every Day    Current packs/day: 0.25    Types: Cigarettes    Passive exposure: Current   Smokeless tobacco: Never   Vaping Use   Vaping status: Never Used  Substance and Sexual Activity   Alcohol use: Yes   Drug use: Not Currently    Types: Cocaine    Comment: Last used in July 2023   Sexual activity: Not Currently       "

## 2024-08-14 ENCOUNTER — Emergency Department (HOSPITAL_COMMUNITY)

## 2024-08-14 ENCOUNTER — Other Ambulatory Visit: Payer: Self-pay

## 2024-08-14 ENCOUNTER — Encounter (HOSPITAL_COMMUNITY): Payer: Self-pay

## 2024-08-14 ENCOUNTER — Inpatient Hospital Stay (HOSPITAL_COMMUNITY)
Admission: EM | Admit: 2024-08-14 | Discharge: 2024-08-19 | DRG: 872 | Discharge status: Against Medical Advice | Attending: Internal Medicine | Admitting: Internal Medicine

## 2024-08-14 DIAGNOSIS — E1142 Type 2 diabetes mellitus with diabetic polyneuropathy: Secondary | ICD-10-CM | POA: Diagnosis present

## 2024-08-14 DIAGNOSIS — Z888 Allergy status to other drugs, medicaments and biological substances status: Secondary | ICD-10-CM

## 2024-08-14 DIAGNOSIS — Z86711 Personal history of pulmonary embolism: Secondary | ICD-10-CM | POA: Diagnosis not present

## 2024-08-14 DIAGNOSIS — B957 Other staphylococcus as the cause of diseases classified elsewhere: Secondary | ICD-10-CM | POA: Diagnosis not present

## 2024-08-14 DIAGNOSIS — F411 Generalized anxiety disorder: Secondary | ICD-10-CM | POA: Diagnosis not present

## 2024-08-14 DIAGNOSIS — S88111A Complete traumatic amputation at level between knee and ankle, right lower leg, initial encounter: Secondary | ICD-10-CM | POA: Diagnosis present

## 2024-08-14 DIAGNOSIS — Z87828 Personal history of other (healed) physical injury and trauma: Secondary | ICD-10-CM

## 2024-08-14 DIAGNOSIS — F17210 Nicotine dependence, cigarettes, uncomplicated: Secondary | ICD-10-CM | POA: Diagnosis present

## 2024-08-14 DIAGNOSIS — E872 Acidosis, unspecified: Secondary | ICD-10-CM | POA: Diagnosis present

## 2024-08-14 DIAGNOSIS — S88112A Complete traumatic amputation at level between knee and ankle, left lower leg, initial encounter: Secondary | ICD-10-CM | POA: Diagnosis present

## 2024-08-14 DIAGNOSIS — K2210 Ulcer of esophagus without bleeding: Secondary | ICD-10-CM | POA: Diagnosis present

## 2024-08-14 DIAGNOSIS — K269 Duodenal ulcer, unspecified as acute or chronic, without hemorrhage or perforation: Secondary | ICD-10-CM | POA: Diagnosis present

## 2024-08-14 DIAGNOSIS — F319 Bipolar disorder, unspecified: Secondary | ICD-10-CM | POA: Diagnosis not present

## 2024-08-14 DIAGNOSIS — I480 Paroxysmal atrial fibrillation: Secondary | ICD-10-CM | POA: Diagnosis present

## 2024-08-14 DIAGNOSIS — R319 Hematuria, unspecified: Secondary | ICD-10-CM | POA: Diagnosis present

## 2024-08-14 DIAGNOSIS — Z1152 Encounter for screening for COVID-19: Secondary | ICD-10-CM | POA: Diagnosis not present

## 2024-08-14 DIAGNOSIS — S88112D Complete traumatic amputation at level between knee and ankle, left lower leg, subsequent encounter: Secondary | ICD-10-CM | POA: Diagnosis not present

## 2024-08-14 DIAGNOSIS — K219 Gastro-esophageal reflux disease without esophagitis: Secondary | ICD-10-CM | POA: Diagnosis not present

## 2024-08-14 DIAGNOSIS — I739 Peripheral vascular disease, unspecified: Secondary | ICD-10-CM | POA: Diagnosis not present

## 2024-08-14 DIAGNOSIS — Z5329 Procedure and treatment not carried out because of patient's decision for other reasons: Secondary | ICD-10-CM | POA: Diagnosis present

## 2024-08-14 DIAGNOSIS — E785 Hyperlipidemia, unspecified: Secondary | ICD-10-CM | POA: Diagnosis present

## 2024-08-14 DIAGNOSIS — F3130 Bipolar disorder, current episode depressed, mild or moderate severity, unspecified: Secondary | ICD-10-CM | POA: Diagnosis present

## 2024-08-14 DIAGNOSIS — E1165 Type 2 diabetes mellitus with hyperglycemia: Secondary | ICD-10-CM | POA: Diagnosis present

## 2024-08-14 DIAGNOSIS — Z89512 Acquired absence of left leg below knee: Secondary | ICD-10-CM | POA: Diagnosis not present

## 2024-08-14 DIAGNOSIS — K7460 Unspecified cirrhosis of liver: Secondary | ICD-10-CM | POA: Diagnosis present

## 2024-08-14 DIAGNOSIS — S88111D Complete traumatic amputation at level between knee and ankle, right lower leg, subsequent encounter: Secondary | ICD-10-CM

## 2024-08-14 DIAGNOSIS — Z993 Dependence on wheelchair: Secondary | ICD-10-CM

## 2024-08-14 DIAGNOSIS — Z5900 Homelessness unspecified: Secondary | ICD-10-CM

## 2024-08-14 DIAGNOSIS — Z7901 Long term (current) use of anticoagulants: Secondary | ICD-10-CM | POA: Diagnosis not present

## 2024-08-14 DIAGNOSIS — Z7984 Long term (current) use of oral hypoglycemic drugs: Secondary | ICD-10-CM

## 2024-08-14 DIAGNOSIS — A4902 Methicillin resistant Staphylococcus aureus infection, unspecified site: Secondary | ICD-10-CM | POA: Diagnosis present

## 2024-08-14 DIAGNOSIS — E66811 Obesity, class 1: Secondary | ICD-10-CM | POA: Diagnosis present

## 2024-08-14 DIAGNOSIS — Z8249 Family history of ischemic heart disease and other diseases of the circulatory system: Secondary | ICD-10-CM

## 2024-08-14 DIAGNOSIS — Z79899 Other long term (current) drug therapy: Secondary | ICD-10-CM

## 2024-08-14 DIAGNOSIS — E119 Type 2 diabetes mellitus without complications: Secondary | ICD-10-CM

## 2024-08-14 DIAGNOSIS — N400 Enlarged prostate without lower urinary tract symptoms: Secondary | ICD-10-CM | POA: Diagnosis present

## 2024-08-14 DIAGNOSIS — I10 Essential (primary) hypertension: Secondary | ICD-10-CM | POA: Diagnosis present

## 2024-08-14 DIAGNOSIS — F4323 Adjustment disorder with mixed anxiety and depressed mood: Secondary | ICD-10-CM | POA: Diagnosis present

## 2024-08-14 DIAGNOSIS — R7881 Bacteremia: Secondary | ICD-10-CM | POA: Diagnosis present

## 2024-08-14 DIAGNOSIS — Z89511 Acquired absence of right leg below knee: Secondary | ICD-10-CM | POA: Diagnosis not present

## 2024-08-14 DIAGNOSIS — F1414 Cocaine abuse with cocaine-induced mood disorder: Secondary | ICD-10-CM | POA: Diagnosis present

## 2024-08-14 DIAGNOSIS — D61818 Other pancytopenia: Secondary | ICD-10-CM | POA: Diagnosis present

## 2024-08-14 DIAGNOSIS — E871 Hypo-osmolality and hyponatremia: Secondary | ICD-10-CM | POA: Diagnosis present

## 2024-08-14 DIAGNOSIS — D649 Anemia, unspecified: Secondary | ICD-10-CM | POA: Diagnosis not present

## 2024-08-14 DIAGNOSIS — K7030 Alcoholic cirrhosis of liver without ascites: Secondary | ICD-10-CM | POA: Diagnosis present

## 2024-08-14 DIAGNOSIS — R0602 Shortness of breath: Secondary | ICD-10-CM | POA: Diagnosis present

## 2024-08-14 DIAGNOSIS — I159 Secondary hypertension, unspecified: Secondary | ICD-10-CM

## 2024-08-14 DIAGNOSIS — Z6833 Body mass index (BMI) 33.0-33.9, adult: Secondary | ICD-10-CM

## 2024-08-14 DIAGNOSIS — Z833 Family history of diabetes mellitus: Secondary | ICD-10-CM

## 2024-08-14 DIAGNOSIS — E1151 Type 2 diabetes mellitus with diabetic peripheral angiopathy without gangrene: Secondary | ICD-10-CM | POA: Diagnosis present

## 2024-08-14 DIAGNOSIS — F1010 Alcohol abuse, uncomplicated: Secondary | ICD-10-CM | POA: Diagnosis present

## 2024-08-14 DIAGNOSIS — B9562 Methicillin resistant Staphylococcus aureus infection as the cause of diseases classified elsewhere: Secondary | ICD-10-CM | POA: Diagnosis not present

## 2024-08-14 LAB — CBC WITH DIFFERENTIAL/PLATELET
Abs Immature Granulocytes: 0.02 K/uL (ref 0.00–0.07)
Basophils Absolute: 0 K/uL (ref 0.0–0.1)
Basophils Relative: 0 %
Eosinophils Absolute: 0 K/uL (ref 0.0–0.5)
Eosinophils Relative: 0 %
HCT: 32.7 % — ABNORMAL LOW (ref 39.0–52.0)
Hemoglobin: 11.5 g/dL — ABNORMAL LOW (ref 13.0–17.0)
Immature Granulocytes: 1 %
Lymphocytes Relative: 5 %
Lymphs Abs: 0.2 K/uL — ABNORMAL LOW (ref 0.7–4.0)
MCH: 33.3 pg (ref 26.0–34.0)
MCHC: 35.2 g/dL (ref 30.0–36.0)
MCV: 94.8 fL (ref 80.0–100.0)
Monocytes Absolute: 0.3 K/uL (ref 0.1–1.0)
Monocytes Relative: 9 %
Neutro Abs: 3 K/uL (ref 1.7–7.7)
Neutrophils Relative %: 85 %
Platelets: 53 K/uL — ABNORMAL LOW (ref 150–400)
RBC: 3.45 MIL/uL — ABNORMAL LOW (ref 4.22–5.81)
RDW: 13.2 % (ref 11.5–15.5)
WBC: 3.5 K/uL — ABNORMAL LOW (ref 4.0–10.5)
nRBC: 0 % (ref 0.0–0.2)

## 2024-08-14 LAB — COMPREHENSIVE METABOLIC PANEL WITH GFR
ALT: 19 U/L (ref 0–44)
AST: 43 U/L — ABNORMAL HIGH (ref 15–41)
Albumin: 3.2 g/dL — ABNORMAL LOW (ref 3.5–5.0)
Alkaline Phosphatase: 91 U/L (ref 38–126)
Anion gap: 12 (ref 5–15)
BUN: 11 mg/dL (ref 8–23)
CO2: 20 mmol/L — ABNORMAL LOW (ref 22–32)
Calcium: 8.2 mg/dL — ABNORMAL LOW (ref 8.9–10.3)
Chloride: 100 mmol/L (ref 98–111)
Creatinine, Ser: 0.69 mg/dL (ref 0.61–1.24)
GFR, Estimated: >60 mL/min
Glucose, Bld: 159 mg/dL — ABNORMAL HIGH (ref 70–99)
Potassium: 3.3 mmol/L — ABNORMAL LOW (ref 3.5–5.1)
Sodium: 133 mmol/L — ABNORMAL LOW (ref 135–145)
Total Bilirubin: 1.9 mg/dL — ABNORMAL HIGH (ref 0.0–1.2)
Total Protein: 6.3 g/dL — ABNORMAL LOW (ref 6.5–8.1)

## 2024-08-14 LAB — RESP PANEL BY RT-PCR (RSV, FLU A&B, COVID)  RVPGX2
Influenza A by PCR: NEGATIVE
Influenza B by PCR: NEGATIVE
Resp Syncytial Virus by PCR: NEGATIVE
SARS Coronavirus 2 by RT PCR: NEGATIVE

## 2024-08-14 LAB — TROPONIN T, HIGH SENSITIVITY: Troponin T High Sensitivity: <15 ng/L (ref 0–19)

## 2024-08-14 LAB — I-STAT CG4 LACTIC ACID, ED
Lactic Acid, Venous: 2 mmol/L (ref 0.5–1.9)
Lactic Acid, Venous: 1.4 mmol/L (ref 0.5–1.9)

## 2024-08-14 LAB — ETHANOL: Alcohol, Ethyl (B): <15 mg/dL

## 2024-08-14 LAB — CBG MONITORING, ED: Glucose-Capillary: 178 mg/dL — ABNORMAL HIGH (ref 70–99)

## 2024-08-14 LAB — MAGNESIUM: Magnesium: 1.5 mg/dL — ABNORMAL LOW (ref 1.7–2.4)

## 2024-08-14 MED ORDER — INSULIN ASPART 100 UNIT/ML IJ SOLN
0.0000 [IU] | Freq: Three times a day (TID) | INTRAMUSCULAR | Status: DC
  Administered 2024-08-15: 5 [IU] via SUBCUTANEOUS
  Administered 2024-08-15: 2 [IU] via SUBCUTANEOUS
  Administered 2024-08-16: 3 [IU] via SUBCUTANEOUS
  Administered 2024-08-16: 5 [IU] via SUBCUTANEOUS
  Administered 2024-08-16 – 2024-08-17 (×4): 3 [IU] via SUBCUTANEOUS
  Administered 2024-08-18: 2 [IU] via SUBCUTANEOUS
  Administered 2024-08-18: 5 [IU] via SUBCUTANEOUS
  Administered 2024-08-18: 3 [IU] via SUBCUTANEOUS
  Administered 2024-08-19: 2 [IU] via SUBCUTANEOUS
  Filled 2024-08-14: qty 1
  Filled 2024-08-14: qty 5
  Filled 2024-08-14: qty 1
  Filled 2024-08-14 (×2): qty 3
  Filled 2024-08-14 (×3): qty 1
  Filled 2024-08-14 (×2): qty 5
  Filled 2024-08-14: qty 3
  Filled 2024-08-14: qty 2

## 2024-08-14 MED ORDER — ACETAMINOPHEN 650 MG RE SUPP: 650.0000 mg | Freq: Four times a day (QID) | RECTAL | Status: DC | PRN

## 2024-08-14 MED ORDER — APIXABAN 5 MG PO TABS
5.0000 mg | ORAL_TABLET | Freq: Two times a day (BID) | ORAL | Status: DC
  Administered 2024-08-14 – 2024-08-15 (×2): 5 mg via ORAL
  Filled 2024-08-14 (×2): qty 1

## 2024-08-14 MED ORDER — TRAZODONE HCL 50 MG PO TABS
150.0000 mg | ORAL_TABLET | Freq: Every evening | ORAL | Status: DC | PRN
  Administered 2024-08-15 – 2024-08-18 (×4): 150 mg via ORAL
  Filled 2024-08-14 (×4): qty 3

## 2024-08-14 MED ORDER — ACETAMINOPHEN 325 MG PO TABS
650.0000 mg | ORAL_TABLET | Freq: Four times a day (QID) | ORAL | Status: DC | PRN
  Administered 2024-08-14 – 2024-08-18 (×8): 650 mg via ORAL
  Filled 2024-08-14 (×8): qty 2

## 2024-08-14 MED ORDER — ACETAMINOPHEN 500 MG PO TABS
500.0000 mg | ORAL_TABLET | Freq: Once | ORAL | Status: AC
  Administered 2024-08-14: 500 mg via ORAL
  Filled 2024-08-14: qty 1

## 2024-08-14 MED ORDER — DAPTOMYCIN-SODIUM CHLORIDE 700-0.9 MG/100ML-% IV SOLN
8.0000 mg/kg | Freq: Every day | INTRAVENOUS | Status: DC
  Administered 2024-08-14: 700 mg via INTRAVENOUS
  Filled 2024-08-14 (×2): qty 100

## 2024-08-14 MED ORDER — PANTOPRAZOLE SODIUM 40 MG PO TBEC
40.0000 mg | DELAYED_RELEASE_TABLET | Freq: Two times a day (BID) | ORAL | Status: DC
  Administered 2024-08-15 – 2024-08-19 (×9): 40 mg via ORAL
  Filled 2024-08-14 (×9): qty 1

## 2024-08-14 MED ORDER — POTASSIUM CHLORIDE CRYS ER 20 MEQ PO TBCR
40.0000 meq | EXTENDED_RELEASE_TABLET | Freq: Once | ORAL | Status: AC
  Administered 2024-08-14: 40 meq via ORAL
  Filled 2024-08-14: qty 2

## 2024-08-14 MED ORDER — ACETAMINOPHEN 325 MG PO TABS
650.0000 mg | ORAL_TABLET | Freq: Once | ORAL | Status: AC
  Administered 2024-08-14: 650 mg via ORAL
  Filled 2024-08-14: qty 2

## 2024-08-14 MED ORDER — SODIUM CHLORIDE 0.9% FLUSH
3.0000 mL | Freq: Two times a day (BID) | INTRAVENOUS | Status: DC
  Administered 2024-08-14 – 2024-08-19 (×10): 3 mL via INTRAVENOUS

## 2024-08-14 MED ORDER — POLYETHYLENE GLYCOL 3350 17 G PO PACK
17.0000 g | PACK | Freq: Every day | ORAL | Status: DC | PRN
  Administered 2024-08-18: 17 g via ORAL
  Filled 2024-08-14: qty 1

## 2024-08-14 NOTE — ED Triage Notes (Signed)
 Pt arrives via EMS from home. Pt reports chest pain for the past couple of nights.

## 2024-08-14 NOTE — ED Notes (Signed)
 Attending provider paged to inform of elevated temp.

## 2024-08-14 NOTE — ED Provider Notes (Signed)
 " [place] EMERGENCY DEPARTMENT AT [facility] Provider Note   CSN: [id number] Arrival date & time: 08/14/24  1059     Patient presents with: Abdominal Pain   Cory Palmer is a 64 y.o. male.   64 year old male presenting emergency department with generalized malaise, fevers, chest pain, shortness of breath.  Reports continuation of symptoms from 2 days ago when he left the hospital AMA.  No nausea no vomiting.  Is having some abdominal discomfort with some nausea vomiting   Abdominal Pain      Prior to Admission medications  Medication Sig Start Date End Date Taking? Authorizing Provider  collagenase  (SANTYL ) 250 UNIT/GM ointment Apply topically daily. Apply Santyl  to bilat stump wounds every day, then cover with moist gauze and foam dressings, change foam dressings every 3 days or as needed soiling 07/10/24   Cheryle Page, MD  doxycycline  (VIBRAMYCIN ) 100 MG capsule Take 1 capsule (100 mg total) by mouth 2 (two) times daily. 07/10/24   Cheryle Page, MD  ondansetron  (ZOFRAN ) 4 MG tablet Take 1 tablet (4 mg total) by mouth every 8 (eight) hours as needed for nausea or vomiting. 07/10/24   Cheryle Page, MD  pantoprazole  (PROTONIX ) 40 MG tablet Take 1 tablet (40 mg total) by mouth 2 (two) times daily before a meal. 07/10/24   Cheryle Page, MD  traZODone  (DESYREL ) 150 MG tablet Take 150 mg by mouth at bedtime. 04/17/24   [provider]    Allergies: Atorvastatin calcium  and Gemfibrozil    Review of Systems  Gastrointestinal:  Positive for abdominal pain.    Updated Vital Signs BP 133/71 (BP Location: Right Arm)   Pulse 90   Temp 99.1 F (37.3 C) (Oral)   Resp 18   SpO2 100%   Physical Exam Vitals and nursing note reviewed.  Constitutional:      General: He is not in acute distress.    Appearance: He is obese. He is not toxic-appearing.  HENT:     Head: Normocephalic.  Cardiovascular:     Rate and Rhythm: Normal rate and regular rhythm.   Pulmonary:     Effort: Pulmonary effort is normal.     Breath sounds: Normal breath sounds.  Abdominal:     General: Abdomen is flat.     Palpations: Abdomen is soft.     Tenderness: There is no abdominal tenderness.  Skin:    Capillary Refill: Capillary refill takes less than 2 seconds.  Neurological:     Mental Status: He is oriented to person, place, and time.  Psychiatric:        Mood and Affect: Mood normal.        Behavior: Behavior normal.     (all labs ordered are listed, but only abnormal results are displayed) Labs Reviewed  CBC WITH DIFFERENTIAL/PLATELET - Abnormal; Notable for the following components:      Result Value   WBC 3.5 (*)    RBC 3.45 (*)    Hemoglobin 11.5 (*)    HCT 32.7 (*)    Platelets 53 (*)    Lymphs Abs 0.2 (*)    All other components within normal limits  COMPREHENSIVE METABOLIC PANEL WITH GFR - Abnormal; Notable for the following components:   Sodium 133 (*)    Potassium 3.3 (*)    CO2 20 (*)    Glucose, Bld 159 (*)    Calcium  8.2 (*)    Total Protein 6.3 (*)    Albumin 3.2 (*)  AST 43 (*)    Total Bilirubin 1.9 (*)    All other components within normal limits  I-STAT CG4 LACTIC ACID, ED - Abnormal; Notable for the following components:   Lactic Acid, Venous 2.0 (*)    All other components within normal limits  RESP PANEL BY RT-PCR (RSV, FLU A&B, COVID)  RVPGX2  CULTURE, BLOOD (ROUTINE X 2)  CULTURE, BLOOD (ROUTINE X 2)  ETHANOL  CBC WITH DIFFERENTIAL/PLATELET  I-STAT CG4 LACTIC ACID, ED  TROPONIN T, HIGH SENSITIVITY  TROPONIN T, HIGH SENSITIVITY    EKG: None  Radiology: DG Chest 1 View Result Date: 08/14/2024 EXAM: 1 VIEW(S) XRAY OF THE CHEST 08/14/2024 11:59:00 AM COMPARISON: 08/11/2024 CLINICAL HISTORY: The patient presents with fever and cough. FINDINGS: LUNGS AND PLEURA: Low lung volumes with bronchovascular crowding. Mild diffuse interstitial opacities. Elevated right hemidiaphragm more pronounced than on the previous  study. No pleural effusion. No pneumothorax. HEART AND MEDIASTINUM: No acute abnormality of the cardiac and mediastinal silhouettes. BONES AND SOFT TISSUES: No acute osseous abnormality. IMPRESSION: 1. Low lung volumes with bronchovascular crowding and mild diffuse interstitial opacities. 2. Elevated right hemidiaphragm, more pronounced than on the previous study. Electronically signed by: Evalene Coho MD 08/14/2024 12:42 PM EST RP Workstation: [id number]     Procedures   Medications Ordered in the ED  DAPTOmycin  (CUBICIN ) IVPB 700 mg/100mL premix (has no administration in time range)  acetaminophen  (TYLENOL ) tablet 650 mg (650 mg Oral Given 08/14/24 1213)                                    Medical Decision Making 64 year old male complex past medical history to include diabetes, bipolar, homelessness, polysubstance abuse, PAF, cirrhosis, prior stroke presenting emergency department generalized malaise and weakness.  Febrile tacky, white count only 3.5.  Lactate not significantly elevated.  Per chart review appears that he was admitted for SIRS/sepsis and left AMA 2 days ago.  Infectious disease was following the patient.  Did have a positive blood culture.  Per note it appears that he was post to get a TTE and repeat blood cultures but never received.  His chest x-ray today with possible opacifications?  EKG without ischemic changes.  Troponin negative.  ACS/cardiogenic source less likely.  Low suspicion for PE.  He is flu/COVID/RSV negative.  Labs with stable AST and bilirubin.  Has a soft nontender abdomen.  Low suspicion for intra-abdominal source.  Did have a CT scan prior to admission without significant findings.  Given his fever tachycardia and incomplete workup from prior hospitalization will admit for workup.  Risk Decision regarding hospitalization.      Final diagnoses:  Bacteremia    ED Discharge Orders     None          Neysa Caron PARAS, DO 08/14/24 1625  "

## 2024-08-14 NOTE — ED Provider Triage Note (Signed)
 Emergency Medicine Provider Triage Evaluation Note  Cory Palmer , a 64 y.o. male  was evaluated in triage.  Pt complains of he is brought in by ambulance from [facility].  He said he feels weak and unwell.  Chest pain and shortness of breath.  Cough..  Review of Systems  Positive: Chest pain Negative: Abdominal pain  Physical Exam  BP (!) 143/81 (BP Location: Left Arm)   Pulse (!) 112   Temp (!) 100.4 F (38 C) (Oral)   Resp 18   SpO2 99%  Gen:   Awake, no distress   Resp:  Normal effort  MSK:   Moves extremities without difficulty  Other:  Heart is rapid and slightly irregular  Medical Decision Making  Medically screening exam initiated at 11:09 AM.  Appropriate orders placed.  Cory Palmer was informed that the remainder of the evaluation will be completed by another provider, this initial triage assessment does not replace that evaluation, and the importance of remaining in the ED until their evaluation is complete.     Towana Ozell BROCKS, MD 08/15/24 [phone]

## 2024-08-14 NOTE — ED Triage Notes (Signed)
 Pt c/o RUQ, LUQ abdominal [id number]. Pt states,  I do have a little bit of N/V/D.

## 2024-08-14 NOTE — H&P (Addendum)
 " History and Physical   DIMA FERRUFINO FMW:[id number] DOB: 03-11-1961 DOA: 08/14/2024  PCP: Delores Rojelio Caldron, NP   Patient coming from: Home  Chief Complaint: Bacteremia, feeling unwell  HPI: Cory Palmer is a 64 y.o. male with medical history significant of hypertension, diabetes, neuropathy, GERD, esophagitis, atrial fibrillation, PAD, anemia, bipolar disorder, depression, anxiety, subdural hematoma, cocaine use, alcohol use, cirrhosis, BPH, bilateral BKA, PE, homelessness presenting with continuing to feel unwell and recent positive blood cultures.  Patient presented on 1/11 with shortness of breath and was admitted with SIRS and concern for sepsis.  Started on vancomycin , ceftriaxone , Flagyl  at that time.  Was admitted but while boarding in the ED left AMA.  Procalcitonin came back mildly elevated at 0.22.  Blood cultures came back positive for MRSA.  Patient has continued to feel unwell since leaving with fevers, fatigue, chest pain, shortness of breath as well as some nausea and vomiting.  Has not [place] presented for further treatment.  Denies constipation, diarrhea.  ED Course: Vital signs in the ED notable for fever to 100.4, heart rate in the 90s-110s, blood pressure in the 130s-140 systolic.  Lab workup included CMP with sodium 133, potassium 3.3, bicarb 20, glucose 159, calcium  8.2, protein 6.3, albumin 3.2, AST 43.  CBC with pancytopenia: White count 3.5, hemoglobin stable 11.5, platelets stable at 53.  Troponin normal with repeat pending.  Lactic acid 2.0, repeat normal.  Respiratory panel for flu COVID RSV negative.  Ethanol level negative.  Blood cultures pending.  Chest x-ray with low lung volumes and mild diffuse opacities.  Daptomycin  ordered by ID in the ED (likely auto consulted with positive blood cultures for MRSA).  Will reach out to see if they will be following.  Review of Systems: As per HPI otherwise all other systems reviewed and are negative.  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   Severe sepsis (HCC) 03/24/2022   Stroke (HCC)    Upper GI bleed 07/08/2024    Past Surgical History:  Procedure Laterality Date   AMPUTATION Left 09/28/2018   Procedure: LEFT TRANSMETATARSAL AMPUTATION;  Surgeon: Harden Jerona GAILS, MD;  Location: [facility] OR;  Service: Orthopedics;  Laterality: Left;   AMPUTATION Left 10/23/2019   Procedure: AMPUTATION BELOW KNEE;  Surgeon: Harden Jerona GAILS, MD;  Location: [facility] OR;  Service: Orthopedics;  Laterality: Left;   BELOW KNEE LEG AMPUTATION Right    ESOPHAGOGASTRODUODENOSCOPY N/A 07/09/2024   Procedure: EGD (ESOPHAGOGASTRODUODENOSCOPY);  Surgeon: Elicia Claw, MD;  Location: [facility];  Service: Gastroenterology;  Laterality: N/A;   ESOPHAGOGASTRODUODENOSCOPY (EGD) WITH PROPOFOL  N/A 10/19/2020   Procedure: ESOPHAGOGASTRODUODENOSCOPY (EGD) WITH PROPOFOL ;  Surgeon: Kristie Lamprey, MD;  Location[facility] END[facility];  Service: Endoscopy;  Laterality: N/A;   TEE WITHOUT CARDIOVERSION N/A 10/25/2019   Procedure: TRANSESOPHAGEAL ECHOCARDIOGRAM (TEE);  Surgeon: Pietro Redell RAMAN, MD;  Location: [facility] ENDOSCOPY;  Service: Cardiovascular;  Laterality: N/A;    Social History  reports that he has been smoking cigarettes. He has been exposed to tobacco smoke. He has  never used smokeless tobacco. He reports that he does not currently use alcohol. He reports that he does not currently use drugs after having used the following drugs: Cocaine.  Allergies[1]  Family History  Problem Relation Age of Onset    Hypertension Other    Diabetes Mellitus II Other   Reviewed on admission  Prior to Admission medications  Medication Sig Start Date End Date Taking? Authorizing Provider  ELIQUIS  5 MG TABS tablet Take 5 mg by mouth 2 (two) times daily. 08/09/24  Yes [provider]  collagenase  (SANTYL ) 250 UNIT/GM ointment Apply topically daily. Apply Santyl  to bilat stump wounds every day, then cover with moist gauze and foam dressings, change foam dressings every 3 days or as needed soiling 07/10/24   Cheryle Page, MD  doxycycline  (VIBRAMYCIN ) 100 MG capsule Take 1 capsule (100 mg total) by mouth 2 (two) times daily. 07/10/24   Cheryle Page, MD  ondansetron  (ZOFRAN ) 4 MG tablet Take 1 tablet (4 mg total) by mouth every 8 (eight) hours as needed for nausea or vomiting. 07/10/24   Cheryle Page, MD  pantoprazole  (PROTONIX ) 40 MG tablet Take 1 tablet (40 mg total) by mouth 2 (two) times daily before a meal. 07/10/24   Cheryle Page, MD  traZODone  (DESYREL ) 150 MG tablet Take 150 mg by mouth at bedtime. 04/17/24   [provider]    Physical Exam: Vitals:   08/14/24 1059 08/14/24 1102 08/14/24 1517  BP:  (!) 143/81 133/71  Pulse:  (!) 112 90  Resp:  18 18  Temp:  (!) 100.4 F (38 C) 99.1 F (37.3 C)  TempSrc:  Oral Oral  SpO2: 100% 99% 100%    Physical Exam Constitutional:      General: He is not in acute distress.    Appearance: Normal appearance.  HENT:     Head: Normocephalic and atraumatic.     Mouth/Throat:     Mouth: Mucous membranes are moist.     Pharynx: Oropharynx is clear.  Eyes:     Extraocular Movements: Extraocular movements intact.     Pupils: Pupils are equal, round, and reactive to light.  Cardiovascular:     Rate and Rhythm: Normal rate and regular rhythm.     Pulses: Normal pulses.     Heart sounds: Normal heart sounds.  Pulmonary:     Effort: Pulmonary effort is normal. No respiratory distress.     Breath sounds: Normal breath sounds.   Abdominal:     General: Bowel sounds are normal. There is no distension.     Palpations: Abdomen is soft.     Tenderness: There is no abdominal tenderness.  Musculoskeletal:        General: No swelling or deformity.     Comments: Bilateral BKA  Skin:    General: Skin is warm and dry.  Neurological:     General: No focal deficit present.     Mental Status: Mental status is at baseline.     Comments: Tremor (Reports this is chronic)    Labs on Admission: I have personally reviewed following labs and imaging studies  CBC: Recent Labs  Lab 08/11/24 1702 08/12/24 0530 08/14/24 1210  WBC 6.6 4.0 3.5*  NEUTROABS 5.7  --  3.0  HGB 12.9* 10.7* 11.5*  HCT 36.6* 32.1* 32.7*  MCV 96.3 99.1 94.8  PLT 77* 54* 53*    Basic Metabolic Panel: Recent Labs  Lab 08/11/24 1702 08/11/24 2320 08/12/24 0530 08/14/24 1430  NA 132*  --  133* 133*  K 3.6  --  3.2* 3.3*  CL 98  --  103 100  CO2 21*  --  18* 20*  GLUCOSE 175*  --  119* 159*  BUN 11  --  12 11  CREATININE 0.82  --  0.79 0.69  CALCIUM  8.9  --  7.6* 8.2*  MG  --  1.1* 1.7  --     GFR: Estimated Creatinine Clearance: 112.3 mL/min (by C-G formula based on SCr of 0.69 mg/dL).  Liver Function Tests: Recent Labs  Lab 08/11/24 1702 08/14/24 1430  AST 36 43*  ALT 14 19  ALKPHOS 95 91  BILITOT 2.3* 1.9*  PROT 7.4 6.3*  ALBUMIN 3.7 3.2*    Urine analysis:    Component Value Date/Time   COLORURINE AMBER (A) 08/11/2024 1827   APPEARANCEUR CLEAR 08/11/2024 1827   LABSPEC 1.024 08/11/2024 1827   PHURINE 6.0 08/11/2024 1827   GLUCOSEU NEGATIVE 08/11/2024 1827   HGBUR LARGE (A) 08/11/2024 1827   BILIRUBINUR NEGATIVE 08/11/2024 1827   KETONESUR NEGATIVE 08/11/2024 1827   PROTEINUR 30 (A) 08/11/2024 1827   NITRITE NEGATIVE 08/11/2024 1827   LEUKOCYTESUR NEGATIVE 08/11/2024 1827    Radiological Exams on Admission: DG Chest 1 View Result Date: 08/14/2024 EXAM: 1 VIEW(S) XRAY OF THE CHEST 08/14/2024 11:59:00 AM  COMPARISON: 08/11/2024 CLINICAL HISTORY: The patient presents with fever and cough. FINDINGS: LUNGS AND PLEURA: Low lung volumes with bronchovascular crowding. Mild diffuse interstitial opacities. Elevated right hemidiaphragm more pronounced than on the previous study. No pleural effusion. No pneumothorax. HEART AND MEDIASTINUM: No acute abnormality of the cardiac and mediastinal silhouettes. BONES AND SOFT TISSUES: No acute osseous abnormality. IMPRESSION: 1. Low lung volumes with bronchovascular crowding and mild diffuse interstitial opacities. 2. Elevated right hemidiaphragm, more pronounced than on the previous study. Electronically signed by: Evalene Coho MD 08/14/2024 12:42 PM EST RP Workstation: [id number]   EKG: Independently reviewed. Atrial fibrillation with mild RVR to 104 beats minute.  Nonspecific T wave changes.  Assessment/Plan Principal Problem:   Bacteremia Active Problems:   History of pulmonary embolus (PE)   PAF (paroxysmal atrial fibrillation) (HCC)   Cirrhosis of liver (HCC)   Diabetes mellitus type II, non insulin  dependent (HCC)   Depressed bipolar disorder (HCC)   Hypertension   BPH (benign prostatic hyperplasia)   Below-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)   Anemia   Diabetic polyneuropathy associated with type 2 diabetes mellitus (HCC)   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HCC)   GERD (gastroesophageal reflux disease)   PVD (peripheral vascular disease)   Alcohol abuse   Cocaine abuse with cocaine-induced mood disorder (HCC)   GAD (generalized anxiety disorder)   History of traumatic subdural hematoma   Bacteremia ?Developing Sepsis > Patient representing after leaving AMA on 1/11.  Continued fevers, fatigue, chest pain, shortness of breath. > Blood cultures from 1/11 came back positive for MRSA. > Chest x-ray with questionable opacities.  Procalcitonin mildly elevated during last admission to 0.22.  White count actually low 3.5.   Lactic acid 2.0, repeat normal. > Received daptomycin  in the ED.  ID will likely be following with auto consult from MRSA blood cultures.  They ordered the daptomycin . -Monitor in progressive overnight - Reach out to infectious disease to confirm they are following - Continue with daptomycin  as per infectious disease - Trend fever curve and WBC - Follow-up repeat blood cultures - TTE - Supportive care  Diabetes - SSI  Hypertension - Not currently medication  for this  GERD History of esophagitis - Continue PPI  Atrial fibrillation - Continue home Eliquis   PAD - Continue Eliquis   Pancytopenia > White count 3.5, anemia stable at 11.5, platelets stable 5.3. > Likely related to cirrhosis/infection. - Continue to trend CBC  Cirrhosis Alcohol use > AST 43, platelets 53, sodium 133.  PT/INR not checked. > ETOH negative - Check PT/INR - Trend PT/INR and CMP - CIWA w/o Ativan  for now  Bipolar disorder Depression Anxiety - Continue as needed trazodone  - Not on other medications for this  History of cocaine and alcohol use - Noted  Status post bilateral BKA - Noted  History of PE - Continue Eliquis   Homeless - Noted   DVT prophylaxis: Eliquis  Code Status:   Full Family Communication:  None on admission  Disposition Plan:   Patient is from:  Home/unhoused  Anticipated DC to:  Same as above  Anticipated DC date:  2 to 5 days  Anticipated DC barriers: None  Consults called:  Infectious disease Admission status:  Inpatient, progressive  Severity of Illness: The appropriate patient status for this patient is INPATIENT. Inpatient status is judged to be reasonable and necessary in order to provide the required intensity of service to ensure the patient's safety. The patient's presenting symptoms, physical exam findings, and initial radiographic and laboratory data in the context of their chronic comorbidities is felt to place them at high risk for further clinical  deterioration. Furthermore, it is not anticipated that the patient will be medically stable for discharge from the hospital within 2 midnights of admission.   * I certify that at the point of admission it is my clinical judgment that the patient will require inpatient hospital care spanning beyond 2 midnights from the point of admission due to high intensity of service, high risk for further deterioration and high frequency of surveillance required.DEWAINE Marsa KATHEE Seena MD [facility]  How to contact the [facility] Attending or Consulting provider 7A - 7P or covering provider during after hours 7P -7A, for this patient?   Check the care team in [facility] and look for a) attending/consulting [facility] provider listed and b) the [facility] team listed Log into [email or web address] and use [place]'s universal password to access. If you do not have the password, please contact the hospital operator. Locate the [facility] provider you are looking for under [facility] and page to a number that you can be directly reached. If you still have difficulty reaching the provider, please page the [facility] (Director on Call) for the Hospitalists listed on amion for assistance.  08/14/2024, 5:19 PM       [1]  Allergies Allergen Reactions   Atorvastatin Calcium  Nausea And Vomiting   Gemfibrozil Other (See Comments)    Pt cannot recall symptoms   "

## 2024-08-14 NOTE — ED Notes (Signed)
 Spoke to Dr. Franky- additional tylenol  order placed for fever.

## 2024-08-15 ENCOUNTER — Inpatient Hospital Stay (HOSPITAL_COMMUNITY)

## 2024-08-15 DIAGNOSIS — K7460 Unspecified cirrhosis of liver: Secondary | ICD-10-CM | POA: Diagnosis not present

## 2024-08-15 DIAGNOSIS — B957 Other staphylococcus as the cause of diseases classified elsewhere: Secondary | ICD-10-CM | POA: Diagnosis not present

## 2024-08-15 DIAGNOSIS — Z5900 Homelessness unspecified: Secondary | ICD-10-CM

## 2024-08-15 DIAGNOSIS — F1010 Alcohol abuse, uncomplicated: Secondary | ICD-10-CM | POA: Diagnosis not present

## 2024-08-15 DIAGNOSIS — S88111D Complete traumatic amputation at level between knee and ankle, right lower leg, subsequent encounter: Secondary | ICD-10-CM | POA: Diagnosis not present

## 2024-08-15 DIAGNOSIS — S88112D Complete traumatic amputation at level between knee and ankle, left lower leg, subsequent encounter: Secondary | ICD-10-CM | POA: Diagnosis not present

## 2024-08-15 DIAGNOSIS — R7881 Bacteremia: Secondary | ICD-10-CM

## 2024-08-15 DIAGNOSIS — F1414 Cocaine abuse with cocaine-induced mood disorder: Secondary | ICD-10-CM

## 2024-08-15 LAB — BLOOD CULTURE ID PANEL (REFLEXED) - BCID2

## 2024-08-15 LAB — COMPREHENSIVE METABOLIC PANEL WITH GFR
ALT: 16 U/L (ref 0–44)
AST: 32 U/L (ref 15–41)
Albumin: 2.9 g/dL — ABNORMAL LOW (ref 3.5–5.0)
Alkaline Phosphatase: 88 U/L (ref 38–126)
Anion gap: 11 (ref 5–15)
BUN: 13 mg/dL (ref 8–23)
CO2: 20 mmol/L — ABNORMAL LOW (ref 22–32)
Calcium: 7.9 mg/dL — ABNORMAL LOW (ref 8.9–10.3)
Chloride: 98 mmol/L (ref 98–111)
Creatinine, Ser: 0.76 mg/dL (ref 0.61–1.24)
GFR, Estimated: >60 mL/min
Glucose, Bld: 214 mg/dL — ABNORMAL HIGH (ref 70–99)
Potassium: 3.4 mmol/L — ABNORMAL LOW (ref 3.5–5.1)
Sodium: 129 mmol/L — ABNORMAL LOW (ref 135–145)
Total Bilirubin: 1.8 mg/dL — ABNORMAL HIGH (ref 0.0–1.2)
Total Protein: 5.5 g/dL — ABNORMAL LOW (ref 6.5–8.1)

## 2024-08-15 LAB — ECHOCARDIOGRAM COMPLETE
Area-P 1/2: 3.72 cm2
S' Lateral: 4.3 cm

## 2024-08-15 LAB — CBC
HCT: 29.4 % — ABNORMAL LOW (ref 39.0–52.0)
Hemoglobin: 10.1 g/dL — ABNORMAL LOW (ref 13.0–17.0)
MCH: 33.3 pg (ref 26.0–34.0)
MCHC: 34.4 g/dL (ref 30.0–36.0)
MCV: 97 fL (ref 80.0–100.0)
Platelets: 42 K/uL — ABNORMAL LOW (ref 150–400)
RBC: 3.03 MIL/uL — ABNORMAL LOW (ref 4.22–5.81)
RDW: 13.2 % (ref 11.5–15.5)
WBC: 2.6 K/uL — ABNORMAL LOW (ref 4.0–10.5)
nRBC: 0 % (ref 0.0–0.2)

## 2024-08-15 LAB — MINIMUM INHIBITORY CONC. (1 DRUG)

## 2024-08-15 LAB — GLUCOSE, CAPILLARY: Glucose-Capillary: 143 mg/dL — ABNORMAL HIGH (ref 70–99)

## 2024-08-15 LAB — CBG MONITORING, ED
Glucose-Capillary: 147 mg/dL — ABNORMAL HIGH (ref 70–99)
Glucose-Capillary: 220 mg/dL — ABNORMAL HIGH (ref 70–99)

## 2024-08-15 LAB — PROTIME-INR
INR: 1.7 — ABNORMAL HIGH (ref 0.8–1.2)
Prothrombin Time: 21.1 s — ABNORMAL HIGH (ref 11.4–15.2)

## 2024-08-15 LAB — TROPONIN T, HIGH SENSITIVITY: Troponin T High Sensitivity: 16 ng/L (ref 0–19)

## 2024-08-15 MED ORDER — DEXTROSE 5 % IV SOLN
1500.0000 mg | Freq: Once | INTRAVENOUS | Status: AC
  Administered 2024-08-15: 1500 mg via INTRAVENOUS
  Filled 2024-08-15: qty 75

## 2024-08-15 MED ORDER — ZIPRASIDONE MESYLATE 20 MG IM SOLR
10.0000 mg | Freq: Once | INTRAMUSCULAR | Status: AC
  Administered 2024-08-15: 10 mg via INTRAMUSCULAR
  Filled 2024-08-15: qty 20

## 2024-08-15 NOTE — ED Notes (Signed)
 MD at bedside.

## 2024-08-15 NOTE — Progress Notes (Signed)
 Echocardiogram 2D Echocardiogram has been performed.  Cory Palmer 08/15/2024, 2:19 PM

## 2024-08-15 NOTE — ED Notes (Signed)
 Pt refusing finger stick for CBG. Pt states he is leaving and he needs someone to take him home.

## 2024-08-15 NOTE — ED Notes (Addendum)
 Awaiting dalbavancin from pharmacy

## 2024-08-15 NOTE — ED Notes (Signed)
 Pt began yelling in room and trying to pull lines. Pt states that his ride is here and that he needs to go to the lobby. Pt began verbally aggressive yelling dont turn the lights out either because I am going to leave. Pt attempting to get out of bed. MD notified.

## 2024-08-15 NOTE — Consult Note (Signed)
 "    [facility]    Date of Admission:  08/14/2024     Total days of antibiotics                Reason for Consult: MRSA bacteremia   Referring Provider: Sharyon Primary Care Provider: Delores Rojelio Caldron, NP   ASSESSMENT:  Cory Palmer is a 64 y/o male with diabetes, neuropathy, cirrhosis and PAD s/p bilateral lower extremity BKA presenting to the ED on 08/11/24 with shortness of breath and found to have MRSA bacteremia leaving AMA and returning to the hospital with abdominal complaints and continued MRSA bacteremia.  Source of infection is unclear. Appropriately started on Daptomycin . He is adamant that he has to leave and was pulling at lines having received a dose of ziprasidone . Remains at high risk for elopement or leaving AMA again. Discussed plan of care with Cory Palmer, Hospitalist and ID pharmacy team and will give a dose of oritivancin in the event he decides to leave AMA again. Once completed can discontinue Daptomycin . Discussed in detail that this infection carries a high risk of mortality and would be in his best interests to stay and get treated. Check blood culture for clearance of bacteremia and TTE ordered and pending. Suspect he will need TEE. Continued drug use places him at risk for new and worsening infection. Contact precautions for MRSA. Remaining medical and supportive care per Internal Medicine.   PLAN:  Give one dose of oritivancin and following dose can discontinue Daptomycin  for now.  Check blood cultures for clearance of bacteremia.  TTE to check for endocarditis and will likely need TEE.  Contact precautions for MRSA.  Remaining medical and supportive care per Internal Medicine.    Principal Problem:   Bacteremia Active Problems:   Diabetes mellitus type II, non insulin  dependent (HCC)   Depressed bipolar disorder (HCC)   Hypertension   PAF (paroxysmal atrial fibrillation) (HCC)   BPH (benign prostatic hyperplasia)   Below-knee  amputation of right lower extremity (HCC)   Cirrhosis of liver (HCC)   Anemia   Diabetic polyneuropathy associated with type 2 diabetes mellitus (HCC)   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HCC)   GERD (gastroesophageal reflux disease)   PVD (peripheral vascular disease)   Alcohol abuse   Cocaine abuse with cocaine-induced mood disorder (HCC)   History of pulmonary embolus (PE)   GAD (generalized anxiety disorder)   History of traumatic subdural hematoma    insulin  aspart  0-15 Units Subcutaneous TID WC   pantoprazole   40 mg Oral BID AC   sodium chloride  flush  3 mL Intravenous Q12H     HPI: Cory Palmer is a 64 y.o. male with previous medical history of cirrhosis, BPH, Bipolar disorder, PAD s/p bilateral BKA, cocaine and alcohol use, and homelessness presenting to the ED with abdominal pain.  Cory Palmer was initially seen in the ED on 08/11/24 at [facility] presenting with shortness of breath and found to have MRSA bacteremia. Chest x-ray was negative. CT chest/abdomen/pelvis with no acute abnormalities. Toxicology positive for cocaine. Received vancomycin  and piperacillin -tazobactam prior to leaving AMA. Now presents to [facility] ED with abdominal pain. Febrile with temperature of 102.2 F and WBC count of 2,600. Chest x-ray with no active disease with low lung volumes. Started on Daptomycin  with recent MRSA bacteremia. CMET reviewed an hyponatremia noted. Blood cultures from 08/14/24 are growing MRSA.   Cory Palmer has no current areas of pain. Moves around in  a wheelchair. Insistent that he must go to [employer] so that he can get housing after being homeless for a long time. Received a dose of ziprasidone  earlier. Denies any recreational or illicit drug use.    Review of Systems: Review of Systems  Constitutional:  Negative for chills, fever and weight loss.  Respiratory:  Negative for cough, shortness of breath and wheezing.   Cardiovascular:  Negative for chest pain and  leg swelling.  Gastrointestinal:  Negative for abdominal pain, constipation, diarrhea, nausea and vomiting.  Skin:  Negative for rash.     Past Medical History:  Diagnosis Date   Acute hepatic encephalopathy (HCC) 11/17/2021   Anxiety    Atrial flutter with rapid ventricular response (HCC) 10/21/2019   Depressed bipolar disorder (HCC)    Diabetes mellitus without complication (HCC)    Hypertension    Liver cirrhosis (HCC)    Osteomyelitis of left foot (HCC) 10/19/2019   Osteomyelitis of toe of left foot (HCC)    PAF (paroxysmal atrial fibrillation) (HCC)    S/P transmetatarsal amputation of foot, 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   Severe sepsis (HCC) 03/24/2022   Stroke (HCC)    Upper GI bleed 07/08/2024    Social History[1]  Family History  Problem Relation Age of Onset   Hypertension Other    Diabetes Mellitus II Other     Allergies[2]  OBJECTIVE: Blood pressure 113/66, pulse 77, temperature 98.4 F (36.9 C), resp. rate (!) 28, SpO2 96%.  Physical Exam Constitutional:      General: He is not in acute distress.    Appearance: He is well-developed.  Cardiovascular:     Rate and Rhythm: Normal rate and regular rhythm.     Heart sounds: Normal heart sounds.  Pulmonary:     Effort: Pulmonary effort is normal.     Breath sounds: Normal breath sounds.  Skin:    General: Skin is warm and dry.  Neurological:     Mental Status: He is alert and oriented to person, place, and time.     Lab Results Lab Results  Component Value Date   WBC 2.6 (L) 08/15/2024   HGB 10.1 (L) 08/15/2024   HCT 29.4 (L) 08/15/2024   MCV 97.0 08/15/2024   PLT 42 (L) 08/15/2024    Lab Results  Component Value Date   CREATININE 0.76 08/15/2024   BUN 13 08/15/2024   NA 129 (L) 08/15/2024   K 3.4 (L) 08/15/2024   CL 98 08/15/2024   CO2 20 (L) 08/15/2024    Lab Results  Component Value Date   ALT 16 08/15/2024   AST 32 08/15/2024   ALKPHOS 88 08/15/2024   BILITOT 1.8 (H) 08/15/2024      Microbiology: Recent Results (from the past 240 hours)  Resp panel by RT-PCR (RSV, Flu A&B, Covid) Anterior Nasal Swab     Status: None   Collection Time: 08/11/24  3:50 PM   Specimen: Anterior Nasal Swab  Result Value Ref Range Status   SARS Coronavirus 2 by RT PCR NEGATIVE NEGATIVE Final    Comment: (NOTE) SARS-CoV-2 target nucleic acids are NOT DETECTED.  The SARS-CoV-2 RNA is generally detectable in upper respiratory specimens during the acute phase of infection. The lowest concentration of SARS-CoV-2 viral copies this assay can detect is 138 copies/mL. A negative result does not preclude SARS-Cov-2 infection and should not be used as the sole basis for treatment or other patient management decisions. A negative result may occur with  improper specimen collection/handling,  submission of specimen other than nasopharyngeal swab, presence of viral mutation(s) within the areas targeted by this assay, and inadequate number of viral copies(<138 copies/mL). A negative result must be combined with clinical observations, patient history, and epidemiological information. The expected result is Negative.  Fact Sheet for Patients:  bloggercourse.com  Fact Sheet for Healthcare Providers:  seriousbroker.it  This test is no t yet approved or cleared by the [place]  FDA and  has been authorized for detection and/or diagnosis of SARS-CoV-2 by FDA under an Emergency Use Authorization (EUA). This EUA will remain  in effect (meaning this test can be used) for the duration of the COVID-19 declaration under Section 564(b)(1) of the Act, 21 U.S.C.section 360bbb-3(b)(1), unless the authorization is terminated  or revoked sooner.       Influenza A by PCR NEGATIVE NEGATIVE Final   Influenza B by PCR NEGATIVE NEGATIVE Final    Comment: (NOTE) The Xpert Xpress SARS-CoV-2/FLU/RSV plus assay is intended as an aid in the diagnosis of influenza  from Nasopharyngeal swab specimens and should not be used as a sole basis for treatment. Nasal washings and aspirates are unacceptable for Xpert Xpress SARS-CoV-2/FLU/RSV testing.  Fact Sheet for Patients: bloggercourse.com  Fact Sheet for Healthcare Providers: seriousbroker.it  This test is not yet approved or cleared by the [place]  FDA and has been authorized for detection and/or diagnosis of SARS-CoV-2 by FDA under an Emergency Use Authorization (EUA). This EUA will remain in effect (meaning this test can be used) for the duration of the COVID-19 declaration under Section 564(b)(1) of the Act, 21 U.S.C. section 360bbb-3(b)(1), unless the authorization is terminated or revoked.     Resp Syncytial Virus by PCR NEGATIVE NEGATIVE Final    Comment: (NOTE) Fact Sheet for Patients: bloggercourse.com  Fact Sheet for Healthcare Providers: seriousbroker.it  This test is not yet approved or cleared by the [place]  FDA and has been authorized for detection and/or diagnosis of SARS-CoV-2 by FDA under an Emergency Use Authorization (EUA). This EUA will remain in effect (meaning this test can be used) for the duration of the COVID-19 declaration under Section 564(b)(1) of the Act, 21 U.S.C. section 360bbb-3(b)(1), unless the authorization is terminated or revoked.  Performed at [facility], [address], [place], [place] [address]   Blood Culture (routine x 2)     Status: Abnormal (Preliminary result)   Collection Time: 08/11/24  5:02 PM   Specimen: BLOOD RIGHT ARM  Result Value Ref Range Status   Specimen Description   Final    BLOOD RIGHT ARM Performed at [facility], [address] [place], [place] [address]    Special Requests   Final    BOTTLES DRAWN AEROBIC AND ANAEROBIC Blood Culture results may not be optimal due to an inadequate volume  of blood received in culture bottles Performed at [facility], [address] [place], [place] [address]    Culture  Setup Time   Final    GRAM POSITIVE COCCI IN BOTH AEROBIC AND ANAEROBIC BOTTLES CRITICAL RESULT CALLED TO, READ BACK BY AND VERIFIED WITH: N. GLOGOVAC PHARMD, AT 9081 08/12/24 D. JEARLEAN    Culture (A)  Final    METHICILLIN RESISTANT STAPHYLOCOCCUS AUREUS Sent to [facility] for further susceptibility testing. Performed at [facility], [address], [place], [place] [address]    Report Status PENDING  Incomplete   Organism ID, Bacteria METHICILLIN RESISTANT STAPHYLOCOCCUS AUREUS  Final  Susceptibility   Methicillin resistant staphylococcus aureus - MIC*    CIPROFLOXACIN  >=8 RESISTANT Resistant     ERYTHROMYCIN >=8 RESISTANT Resistant     GENTAMICIN <=0.5 SENSITIVE Sensitive     OXACILLIN >=4 RESISTANT Resistant     TETRACYCLINE >=16 RESISTANT Resistant     VANCOMYCIN  <=0.5 SENSITIVE Sensitive     TRIMETH /SULFA  >=320 RESISTANT Resistant     CLINDAMYCIN  <=0.25 SENSITIVE Sensitive     RIFAMPIN <=0.5 SENSITIVE Sensitive     Inducible Clindamycin  NEGATIVE Sensitive     LINEZOLID 2 SENSITIVE Sensitive     * METHICILLIN RESISTANT STAPHYLOCOCCUS AUREUS  Blood Culture ID Panel (Reflexed)     Status: Abnormal   Collection Time: 08/11/24  5:02 PM  Result Value Ref Range Status   Enterococcus faecalis NOT DETECTED NOT DETECTED Final   Enterococcus Faecium NOT DETECTED NOT DETECTED Final   Listeria monocytogenes NOT DETECTED NOT DETECTED Final   Staphylococcus species DETECTED (A) NOT DETECTED Final    Comment: CRITICAL RESULT CALLED TO, READ BACK BY AND VERIFIED WITH: N. GLOGOVAC PHARMD, AT 9081 08/12/24 D. VANHOOK    Staphylococcus aureus (BCID) DETECTED (A) NOT DETECTED Final    Comment: Methicillin (oxacillin)-resistant Staphylococcus aureus (MRSA). MRSA is predictably resistant to beta-lactam antibiotics (except ceftaroline). Preferred  therapy is vancomycin  unless clinically contraindicated. Patient requires contact precautions if  hospitalized. CRITICAL RESULT CALLED TO, READ BACK BY AND VERIFIED WITHBETHA GEANNIE ROLLER PHARMD, AT 9081 08/12/24 D. VANHOOK    Staphylococcus epidermidis NOT DETECTED NOT DETECTED Final   Staphylococcus lugdunensis NOT DETECTED NOT DETECTED Final   Streptococcus species NOT DETECTED NOT DETECTED Final   Streptococcus agalactiae NOT DETECTED NOT DETECTED Final   Streptococcus pneumoniae NOT DETECTED NOT DETECTED Final   Streptococcus pyogenes NOT DETECTED NOT DETECTED Final   A.calcoaceticus-baumannii NOT DETECTED NOT DETECTED Final   Bacteroides fragilis NOT DETECTED NOT DETECTED Final   Enterobacterales NOT DETECTED NOT DETECTED Final   Enterobacter cloacae complex NOT DETECTED NOT DETECTED Final   Escherichia coli NOT DETECTED NOT DETECTED Final   Klebsiella aerogenes NOT DETECTED NOT DETECTED Final   Klebsiella oxytoca NOT DETECTED NOT DETECTED Final   Klebsiella pneumoniae NOT DETECTED NOT DETECTED Final   Proteus species NOT DETECTED NOT DETECTED Final   Salmonella species NOT DETECTED NOT DETECTED Final   Serratia marcescens NOT DETECTED NOT DETECTED Final   Haemophilus influenzae NOT DETECTED NOT DETECTED Final   Neisseria meningitidis NOT DETECTED NOT DETECTED Final   Pseudomonas aeruginosa NOT DETECTED NOT DETECTED Final   Stenotrophomonas maltophilia NOT DETECTED NOT DETECTED Final   Candida albicans NOT DETECTED NOT DETECTED Final   Candida auris NOT DETECTED NOT DETECTED Final   Candida glabrata NOT DETECTED NOT DETECTED Final   Candida krusei NOT DETECTED NOT DETECTED Final   Candida parapsilosis NOT DETECTED NOT DETECTED Final   Candida tropicalis NOT DETECTED NOT DETECTED Final   Cryptococcus neoformans/gattii NOT DETECTED NOT DETECTED Final   Meth resistant mecA/C and MREJ DETECTED (A) NOT DETECTED Final    Comment: CRITICAL RESULT CALLED TO, READ BACK BY AND VERIFIED  WITH: N. GLOGOVAC PHARMD, AT 9081 08/12/24 CHARM CARMIN Performed at [facility], [address] [place], [place] [address]   MIC (1 Drug)-     Status: None   Collection Time: 08/11/24  5:02 PM  Result Value Ref Range Status   Min Inhibitory Conc (1 Drug) BLOOD  Final   Source BLOOD/ MRSA/ DAPTOMYCIN   Final    Comment: Performed at  [facility], [address], [place], [place] [address]  Respiratory (~20 pathogens) panel by PCR     Status: None   Collection Time: 08/12/24  3:30 AM   Specimen: Nasopharyngeal Swab; Respiratory  Result Value Ref Range Status   Adenovirus NOT DETECTED NOT DETECTED Final   Coronavirus 229E NOT DETECTED NOT DETECTED Final    Comment: (NOTE) The Coronavirus on the Respiratory Panel, DOES NOT test for the novel  Coronavirus (2019 nCoV)    Coronavirus HKU1 NOT DETECTED NOT DETECTED Final   Coronavirus NL63 NOT DETECTED NOT DETECTED Final   Coronavirus OC43 NOT DETECTED NOT DETECTED Final   Metapneumovirus NOT DETECTED NOT DETECTED Final   Rhinovirus / Enterovirus NOT DETECTED NOT DETECTED Final   Influenza A NOT DETECTED NOT DETECTED Final   Influenza B NOT DETECTED NOT DETECTED Final   Parainfluenza Virus 1 NOT DETECTED NOT DETECTED Final   Parainfluenza Virus 2 NOT DETECTED NOT DETECTED Final   Parainfluenza Virus 3 NOT DETECTED NOT DETECTED Final   Parainfluenza Virus 4 NOT DETECTED NOT DETECTED Final   Respiratory Syncytial Virus NOT DETECTED NOT DETECTED Final   Bordetella pertussis NOT DETECTED NOT DETECTED Final   Bordetella Parapertussis NOT DETECTED NOT DETECTED Final   Chlamydophila pneumoniae NOT DETECTED NOT DETECTED Final   Mycoplasma pneumoniae NOT DETECTED NOT DETECTED Final    Comment: Performed at [facility], [address] [place], [place] [address]  Culture, blood (routine x 2)     Status: None (Preliminary result)   Collection Time: 08/14/24 11:09 AM   Specimen: BLOOD LEFT HAND  Result Value Ref Range Status    Specimen Description BLOOD LEFT HAND  Final   Special Requests   Final    BOTTLES DRAWN AEROBIC AND ANAEROBIC Blood Culture results may not be optimal due to an inadequate volume of blood received in culture bottles   Culture  Setup Time   Final    GRAM POSITIVE COCCI IN BOTH AEROBIC AND ANAEROBIC BOTTLES CRITICAL RESULT CALLED TO, READ BACK BY AND VERIFIED WITH:  MIRANDA VBYRK PHARM D 08/15/2024 BY DD @ [phone] Performed at [facility], [address], [place], [place] [address]    Culture GRAM POSITIVE COCCI  Final   Report Status PENDING  Incomplete  Resp panel by RT-PCR (RSV, Flu A&B, Covid)     Status: None   Collection Time: 08/14/24 11:09 AM   Specimen: Nasal Swab  Result Value Ref Range Status   SARS Coronavirus 2 by RT PCR NEGATIVE NEGATIVE Final   Influenza A by PCR NEGATIVE NEGATIVE Final   Influenza B by PCR NEGATIVE NEGATIVE Final    Comment: (NOTE) The Xpert Xpress SARS-CoV-2/FLU/RSV plus assay is intended as an aid in the diagnosis of influenza from Nasopharyngeal swab specimens and should not be used as a sole basis for treatment. Nasal washings and aspirates are unacceptable for Xpert Xpress SARS-CoV-2/FLU/RSV testing.  Fact Sheet for Patients: bloggercourse.com  Fact Sheet for Healthcare Providers: seriousbroker.it  This test is not yet approved or cleared by the [place]  FDA and has been authorized for detection and/or diagnosis of SARS-CoV-2 by FDA under an Emergency Use Authorization (EUA). This EUA will remain in effect (meaning this test can be used) for the duration of the COVID-19 declaration under Section 564(b)(1) of the Act, 21 U.S.C. section 360bbb-3(b)(1), unless the authorization is terminated or revoked.     Resp Syncytial Virus by PCR NEGATIVE NEGATIVE Final  Comment: (NOTE) Fact Sheet for Patients: bloggercourse.com  Fact Sheet for Healthcare  Providers: seriousbroker.it  This test is not yet approved or cleared by the [place]  FDA and has been authorized for detection and/or diagnosis of SARS-CoV-2 by FDA under an Emergency Use Authorization (EUA). This EUA will remain in effect (meaning this test can be used) for the duration of the COVID-19 declaration under Section 564(b)(1) of the Act, 21 U.S.C. section 360bbb-3(b)(1), unless the authorization is terminated or revoked.  Performed at [facility], [address] [place], [place] [address]   Blood Culture ID Panel (Reflexed)     Status: Abnormal   Collection Time: 08/14/24 11:09 AM  Result Value Ref Range Status   Enterococcus faecalis NOT DETECTED NOT DETECTED Final   Enterococcus Faecium NOT DETECTED NOT DETECTED Final   Listeria monocytogenes NOT DETECTED NOT DETECTED Final   Staphylococcus species DETECTED (A) NOT DETECTED Final    Comment: CRITICAL RESULT CALLED TO, READ BACK BY AND VERIFIED WITH:  MIRANDA VBYRK PHARM D 08/15/2024 BY DD @ [phone]    Staphylococcus aureus (BCID) DETECTED (A) NOT DETECTED Final    Comment: Methicillin (oxacillin)-resistant Staphylococcus aureus (MRSA). MRSA is predictably resistant to beta-lactam antibiotics (except ceftaroline). Preferred therapy is vancomycin  unless clinically contraindicated. Patient requires contact precautions if  hospitalized. CRITICAL RESULT CALLED TO, READ BACK BY AND VERIFIED WITH:  MIRANDA VBYRK PHARM D 08/15/2024 BY DD @ 0512    Staphylococcus epidermidis NOT DETECTED NOT DETECTED Final   Staphylococcus lugdunensis NOT DETECTED NOT DETECTED Final   Streptococcus species NOT DETECTED NOT DETECTED Final   Streptococcus agalactiae NOT DETECTED NOT DETECTED Final   Streptococcus pneumoniae NOT DETECTED NOT DETECTED Final   Streptococcus pyogenes NOT DETECTED NOT DETECTED Final   A.calcoaceticus-baumannii NOT DETECTED NOT DETECTED Final   Bacteroides fragilis NOT DETECTED NOT  DETECTED Final   Enterobacterales NOT DETECTED NOT DETECTED Final   Enterobacter cloacae complex NOT DETECTED NOT DETECTED Final   Escherichia coli NOT DETECTED NOT DETECTED Final   Klebsiella aerogenes NOT DETECTED NOT DETECTED Final   Klebsiella oxytoca NOT DETECTED NOT DETECTED Final   Klebsiella pneumoniae NOT DETECTED NOT DETECTED Final   Proteus species NOT DETECTED NOT DETECTED Final   Salmonella species NOT DETECTED NOT DETECTED Final   Serratia marcescens NOT DETECTED NOT DETECTED Final   Haemophilus influenzae NOT DETECTED NOT DETECTED Final   Neisseria meningitidis NOT DETECTED NOT DETECTED Final   Pseudomonas aeruginosa NOT DETECTED NOT DETECTED Final   Stenotrophomonas maltophilia NOT DETECTED NOT DETECTED Final   Candida albicans NOT DETECTED NOT DETECTED Final   Candida auris NOT DETECTED NOT DETECTED Final   Candida glabrata NOT DETECTED NOT DETECTED Final   Candida krusei NOT DETECTED NOT DETECTED Final   Candida parapsilosis NOT DETECTED NOT DETECTED Final   Candida tropicalis NOT DETECTED NOT DETECTED Final   Cryptococcus neoformans/gattii NOT DETECTED NOT DETECTED Final   Meth resistant mecA/C and MREJ DETECTED (A) NOT DETECTED Final    Comment: CRITICAL RESULT CALLED TO, READ BACK BY AND VERIFIED WITH:  MIRANDA VBYRK PHARM D 08/15/2024 BY DD @ [phone] Performed at [facility], [address] [place], [place] [address]   Culture, blood (routine x 2)     Status: None (Preliminary result)   Collection Time: 08/14/24 11:14 AM   Specimen: BLOOD RIGHT WRIST  Result Value Ref Range Status   Specimen Description BLOOD RIGHT WRIST  Final   Special Requests   Final  BOTTLES DRAWN AEROBIC AND ANAEROBIC Blood Culture results may not be optimal due to an inadequate volume of blood received in culture bottles   Culture  Setup Time   Final    GRAM POSITIVE COCCI IN BOTH AEROBIC AND ANAEROBIC BOTTLES CRITICAL VALUE NOTED.  VALUE IS CONSISTENT WITH PREVIOUSLY REPORTED  AND CALLED VALUE. Performed at [facility], [address] [place], [place] [address]    Culture GRAM POSITIVE COCCI  Final   Report Status PENDING  Incomplete   I personally spent a total of 30 minutes in the care of the patient today including preparing to see the patient, getting/reviewing separately obtained history, performing a medically appropriate exam/evaluation, counseling and educating, placing orders, referring and communicating with other health care professionals, documenting clinical information in the EHR, independently interpreting results, communicating results, and coordinating care.   Cathlyn July, NP [facility]  08/15/2024  11:11 AM     [1]  Social History Tobacco Use   Smoking status: Every Day    Current packs/day: 0.25    Types: Cigarettes    Passive exposure: Current   Smokeless tobacco: Never  Vaping Use   Vaping status: Never Used  Substance Use Topics   Alcohol use: Not Currently   Drug use: Not Currently    Types: Cocaine    Comment: Last used in July 2023  [2]  Allergies Allergen Reactions   Atorvastatin Calcium  Nausea And Vomiting   Gemfibrozil Other (See Comments)    Pt cannot recall symptoms   "

## 2024-08-15 NOTE — ED Notes (Signed)
 Pt in room saying that he is starving, was not given food yesterday. Did offer sandwich and crackers, he opted for crackers.

## 2024-08-15 NOTE — Progress Notes (Addendum)
" °  Progress Note   Patient: Cory Palmer FMW:[id number] DOB: 1961-03-16 DOA: 08/14/2024     1 DOS: the patient was seen and examined on 08/15/2024   Brief hospital course:  64 y.o. male with medical history significant of hypertension, diabetes, neuropathy, GERD, esophagitis, atrial fibrillation, PAD, anemia, bipolar disorder, depression, anxiety, subdural hematoma, cocaine use, alcohol use, cirrhosis, BPH, bilateral BKA, PE, homelessness presenting with continuing to feel unwell and recent positive blood cultures. Patient presented on 1/11 with shortness of breath and was admitted with SIRS and concern for sepsis. Started on vancomycin , ceftriaxone , Flagyl  at that time. Was admitted but while boarding in the ED left AMA. Procalcitonin came back mildly elevated at 0.22. Blood cultures came back positive for MRSA  ID consulted. TTE pending. On IV Daptomycin .   Assessment and Plan:  MRSA Bacteremia Developing Sepsis -Blood cultures from 1/11 came back positive for MRSA. -Chest x-ray with questionable opacities.  Procalcitonin mildly elevated during last admission to 0.22.  White count actually low 3.5.  Lactic acid 2.0, repeat normal. -Received daptomycin  in the ED.  Continue with the same -ID on board - Follow-up repeat blood cultures - TTE pending - Not a candidate for home IV antibiotics because of illicit drug use.   Diabetes mellitus type 2: - SSI -Ac is 5.7% back in August 2025.   Hypertension - Not currently medication for this   GERD History of esophagitis - Continue PPI   Atrial fibrillation - Continue home Eliquis    PAD - Continue Eliquis    Pancytopenia - Likely related to cirrhosis/infection. - Continue to trend CBC   Cirrhosis Alcohol use -AST 43, platelets 53, sodium 133.  - ETOH negative -INR is 1.7 - CIWA w/o Ativan  for now   Bipolar disorder Depression Anxiety - Continue as needed trazodone    History of cocaine and alcohol use - Counseled  extensively regarding cessation of any illicit drugs   Status post bilateral BKA - Satble   History of PE - Continue Eliquis   Disposition: Home     Subjective: Patient was agitated and trying to pull out IV so he was given one time dose of Geodon . He is currently sleepy.  Physical Exam: Vitals:   08/15/24 0500 08/15/24 0600 08/15/24 0700 08/15/24 0900  BP: 136/68 135/78 117/72 113/66  Pulse: 74 79 78 77  Resp: (!) 25 (!) 24 (!) 21 (!) 28  Temp:  98.4 F (36.9 C)    TempSrc:      SpO2: 95% 95% 93% 96%   Constitutional:drowsy Eyes: PERRL, lids and conjunctivae normal ENMT: Mucous membranes are moist. Posterior pharynx clear of any exudate or lesions.Normal dentition.  Neck: normal, supple, no masses, no thyromegaly Respiratory: clear to auscultation bilaterally, no wheezing, no crackles. Normal respiratory effort. No accessory muscle use.  Cardiovascular: Regular rate and rhythm, no murmurs / rubs / gallops. No extremity edema. 2+ pedal pulses. No carotid bruits.  Abdomen: no tenderness, no masses palpated. No hepatosplenomegaly. Bowel sounds positive.  Musculoskeletal: s/p b/l BKA Skin: no rashes, lesions, ulcers. No induration Neurologic: No focal deficits  Data Reviewed:  There are no new results to review at this time.  Family Communication: None at the bedside  Disposition: Status is: Inpatient Remains inpatient appropriate because: Bacteremia  Planned Discharge Destination: Home    Time spent: 42 minutes  Author: Deliliah Room, MD 08/15/2024 11:05 AM  For on call review [email or web address].  "

## 2024-08-15 NOTE — Progress Notes (Signed)
 PHARMACY - PHYSICIAN COMMUNICATION CRITICAL VALUE ALERT - BLOOD CULTURE IDENTIFICATION (BCID)  Cory Palmer is an 64 y.o. male who presented to [facility] on 08/14/2024 with a chief complaint of CP/N/V/D.  Assessment:  Pt had left hospital [facility] 1/12 during hospitalization for sepsis. Upon return pt was started on daptomycin  by ID given MRSA in previous blood cx, now growing MRSA in 4 of 4 blood cx bottles.  Name of physician contacted: Thomasine MD  Current antibiotics: daptomycin   Changes to prescribed antibiotics recommended:  Patient is on recommended antibiotics - No changes needed  Results for orders placed or performed during the hospital encounter of 08/14/24  Blood Culture ID Panel (Reflexed) (Collected: 08/14/2024 11:09 AM)  Result Value Ref Range   Enterococcus faecalis NOT DETECTED NOT DETECTED   Enterococcus Faecium NOT DETECTED NOT DETECTED   Listeria monocytogenes NOT DETECTED NOT DETECTED   Staphylococcus species DETECTED (A) NOT DETECTED   Staphylococcus aureus (BCID) DETECTED (A) NOT DETECTED   Staphylococcus epidermidis NOT DETECTED NOT DETECTED   Staphylococcus lugdunensis NOT DETECTED NOT DETECTED   Streptococcus species NOT DETECTED NOT DETECTED   Streptococcus agalactiae NOT DETECTED NOT DETECTED   Streptococcus pneumoniae NOT DETECTED NOT DETECTED   Streptococcus pyogenes NOT DETECTED NOT DETECTED   A.calcoaceticus-baumannii NOT DETECTED NOT DETECTED   Bacteroides fragilis NOT DETECTED NOT DETECTED   Enterobacterales NOT DETECTED NOT DETECTED   Enterobacter cloacae complex NOT DETECTED NOT DETECTED   Escherichia coli NOT DETECTED NOT DETECTED   Klebsiella aerogenes NOT DETECTED NOT DETECTED   Klebsiella oxytoca NOT DETECTED NOT DETECTED   Klebsiella pneumoniae NOT DETECTED NOT DETECTED   Proteus species NOT DETECTED NOT DETECTED   Salmonella species NOT DETECTED NOT DETECTED   Serratia marcescens NOT DETECTED NOT DETECTED   Haemophilus influenzae NOT  DETECTED NOT DETECTED   Neisseria meningitidis NOT DETECTED NOT DETECTED   Pseudomonas aeruginosa NOT DETECTED NOT DETECTED   Stenotrophomonas maltophilia NOT DETECTED NOT DETECTED   Candida albicans NOT DETECTED NOT DETECTED   Candida auris NOT DETECTED NOT DETECTED   Candida glabrata NOT DETECTED NOT DETECTED   Candida krusei NOT DETECTED NOT DETECTED   Candida parapsilosis NOT DETECTED NOT DETECTED   Candida tropicalis NOT DETECTED NOT DETECTED   Cryptococcus neoformans/gattii NOT DETECTED NOT DETECTED   Meth resistant mecA/C and MREJ DETECTED (A) NOT DETECTED    Marvetta Dauphin, PharmD, BCPS  08/15/2024  5:19 AM

## 2024-08-16 ENCOUNTER — Inpatient Hospital Stay (HOSPITAL_COMMUNITY)

## 2024-08-16 DIAGNOSIS — K7460 Unspecified cirrhosis of liver: Secondary | ICD-10-CM | POA: Diagnosis not present

## 2024-08-16 DIAGNOSIS — Z86711 Personal history of pulmonary embolism: Secondary | ICD-10-CM | POA: Diagnosis not present

## 2024-08-16 DIAGNOSIS — I480 Paroxysmal atrial fibrillation: Secondary | ICD-10-CM | POA: Diagnosis not present

## 2024-08-16 DIAGNOSIS — R7881 Bacteremia: Secondary | ICD-10-CM | POA: Diagnosis not present

## 2024-08-16 DIAGNOSIS — S88112D Complete traumatic amputation at level between knee and ankle, left lower leg, subsequent encounter: Secondary | ICD-10-CM | POA: Diagnosis not present

## 2024-08-16 DIAGNOSIS — E1142 Type 2 diabetes mellitus with diabetic polyneuropathy: Secondary | ICD-10-CM | POA: Diagnosis not present

## 2024-08-16 DIAGNOSIS — B9562 Methicillin resistant Staphylococcus aureus infection as the cause of diseases classified elsewhere: Secondary | ICD-10-CM

## 2024-08-16 DIAGNOSIS — I739 Peripheral vascular disease, unspecified: Secondary | ICD-10-CM | POA: Diagnosis not present

## 2024-08-16 DIAGNOSIS — K7030 Alcoholic cirrhosis of liver without ascites: Secondary | ICD-10-CM

## 2024-08-16 DIAGNOSIS — F1010 Alcohol abuse, uncomplicated: Secondary | ICD-10-CM | POA: Diagnosis not present

## 2024-08-16 DIAGNOSIS — F1414 Cocaine abuse with cocaine-induced mood disorder: Secondary | ICD-10-CM | POA: Diagnosis not present

## 2024-08-16 DIAGNOSIS — Z87828 Personal history of other (healed) physical injury and trauma: Secondary | ICD-10-CM | POA: Diagnosis not present

## 2024-08-16 LAB — GLUCOSE, CAPILLARY
Glucose-Capillary: 123 mg/dL — ABNORMAL HIGH (ref 70–99)
Glucose-Capillary: 209 mg/dL — ABNORMAL HIGH (ref 70–99)
Glucose-Capillary: 159 mg/dL — ABNORMAL HIGH (ref 70–99)
Glucose-Capillary: 197 mg/dL — ABNORMAL HIGH (ref 70–99)

## 2024-08-16 MED ORDER — OXYCODONE HCL 5 MG PO TABS
2.5000 mg | ORAL_TABLET | Freq: Once | ORAL | Status: AC
  Administered 2024-08-16: 2.5 mg via ORAL
  Filled 2024-08-16: qty 1

## 2024-08-16 MED ORDER — IOHEXOL 350 MG/ML SOLN
75.0000 mL | Freq: Once | INTRAVENOUS | Status: AC | PRN
  Administered 2024-08-16: 75 mL via INTRAVENOUS

## 2024-08-16 NOTE — Plan of Care (Signed)
  Problem: Clinical Measurements: Goal: Ability to maintain clinical measurements within normal limits will improve Outcome: Progressing Goal: Will remain free from infection Outcome: Progressing Goal: Diagnostic test results will improve Outcome: Progressing   Problem: Activity: Goal: Risk for activity intolerance will decrease Outcome: Progressing   Problem: Pain Managment: Goal: General experience of comfort will improve and/or be controlled Outcome: Progressing   Problem: Safety: Goal: Ability to remain free from injury will improve Outcome: Progressing   Problem: Skin Integrity: Goal: Risk for impaired skin integrity will decrease Outcome: Progressing

## 2024-08-16 NOTE — TOC Initial Note (Signed)
 Transition of Care [facility]) - Initial/Assessment Note    Patient Details  Name: Cory Palmer MRN: [id number] Date of Birth: [birth year]  [facility]) CM/SW Contact:    Inocente GORMAN Kindle, LCSW Phone Number: 08/16/2024, 3:32 PM  Clinical Narrative:                 Patient with history of homelessness and substance use. Does receive disability. Has a wheelchair. Unable to meet with patient today but in the event patient leaves [facility], placed warming center info and community resources on the AVS.    Expected Discharge Plan: Home/Self Care Barriers to Discharge: Homeless with medical needs, Continued Medical Work up   Patient Goals and CMS Choice            Expected Discharge Plan and Services In-house Referral: Clinical Social Work Discharge Planning Services: CM Consult   Living arrangements for the past 2 months: Homeless                                      Prior Living Arrangements/Services Living arrangements for the past 2 months: Homeless Lives with:: Self Patient language and need for interpreter reviewed:: Yes Do you feel safe going back to the place where you live?: No   homeless  Need for Family Participation in Patient Care: No (Comment) Care giver support system in place?: No (comment) 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) Criminal Activity/Legal Involvement Pertinent to Current Situation/Hospitalization: No - Comment as needed  Activities of Daily Living   ADL Screening (condition at time of admission) Independently performs ADLs?: Yes (appropriate for developmental age) Is the patient deaf or have difficulty hearing?: No Does the patient have difficulty seeing, even when wearing glasses/contacts?: No Does the patient have difficulty concentrating, remembering, or making decisions?: No  Permission Sought/Granted                  Emotional Assessment Appearance:: Appears stated age     Orientation: : Oriented to Self, Oriented to  Place, Oriented to  Time, Oriented to Situation Alcohol / Substance Use: Illicit Drugs Psych Involvement: No (comment)  Admission diagnosis:  Bacteremia [R78.81] Patient Active Problem List   Diagnosis Date Noted   Bacteremia 08/14/2024   History of traumatic subdural hematoma 03/24/2024   GAD (generalized anxiety disorder) 06/08/2023   History of pulmonary embolus (PE) 12/13/2022   Tobacco use 12/13/2022   Substance induced mood disorder (HCC) 11/08/2022   Pneumonia 08/26/2022   Suicide ideation 06/26/2022   Alcohol abuse 04/10/2022   Cocaine abuse with cocaine-induced mood disorder (HCC) 04/10/2022   Alcohol-induced mood disorder (HCC) 04/10/2022   Nausea vomiting and diarrhea 03/24/2022   Prolonged QT interval 03/24/2022   Macrocytosis without anemia 03/24/2022   PVD (peripheral vascular disease) 11/17/2021   Metabolic acidosis 11/17/2021   Esophagitis 11/17/2021   Thrombocytopenia 11/11/2021   GERD (gastroesophageal reflux disease) 10/10/2020   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HCC) 05/06/2020   MRSA bacteremia 10/21/2019   Severe protein-calorie malnutrition    Diabetic polyneuropathy associated with type 2 diabetes mellitus (HCC)    Cirrhosis of liver (HCC) 10/19/2019   Anemia 10/19/2019   Medication monitoring encounter 08/15/2019   Serum total bilirubin elevated 10/29/2018   BPH (benign prostatic hyperplasia) 10/29/2018   Below-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)    PAF (paroxysmal atrial fibrillation) (HCC)    Depressed bipolar disorder (HCC) 10/05/2018  Hypertension 10/05/2018   Hyperbilirubinemia 10/05/2018   Homelessness 05/07/2018   Diabetes mellitus type II, non insulin  dependent (HCC) 05/07/2018   PCP:  Delores Rojelio Caldron, NP Pharmacy:   [facility] [address] [place] [address] Phone: [phone] Fax: [phone]     Social Drivers of Health (SDOH) Social History: SDOH  Screenings   Food Insecurity: Food Insecurity Present (08/15/2024)  Housing: High Risk (08/15/2024)  Transportation Needs: Unmet Transportation Needs (08/15/2024)  Utilities: Not At Risk (08/15/2024)  Depression (PHQ2-9): High Risk (05/16/2022)  Financial Resource Strain: Not on File (11/18/2021)   Received from [facility]  Physical Activity: Not on File (11/18/2021)   Received from [facility]  Social Connections: Not on File (04/15/2023)   Received from [facility]  Stress: Not on File (11/18/2021)   Received from [facility]  Tobacco Use: High Risk (08/14/2024)   SDOH Interventions: Food Insecurity Interventions: [employer] Provided, Inpatient [employer] Housing Interventions: [employer] Provided, Inpatient [employer] Transportation Interventions: Community Resources Provided, Inpatient TOC   Readmission Risk Interventions    08/16/2024    3:31 PM  Readmission Risk Prevention Plan  Transportation Screening Complete  Medication Review Oceanographer) Complete  PCP or Specialist appointment within 3-5 days of discharge Complete  HRI or Home Care Consult Complete  SW Recovery Care/Counseling Consult Complete  Palliative Care Screening Not Applicable  Skilled Nursing Facility Not Applicable

## 2024-08-16 NOTE — Progress Notes (Signed)
 "                        PROGRESS NOTE        PATIENT DETAILS Name: Cory Palmer Age: 64 y.o. Sex: male Date of Birth: August 08, 1960 Admit Date: 08/14/2024 Admitting Physician Marsa KATHEE Scurry, MD ERE:Amntw, Rojelio Caldron, NP  Brief Summary: Patient is a 64 y.o.  male with history of B/L BKA-homelessness-cocaine/EtOH use-urine A-fib/PE on Eliquis -was recently hospitalized from 1/11-1/12-4 sepsis-blood cultures were positive for MRSA but patient unfortunately signed out AMA-presented to the hospital on 1/14 with subjective fever/fatigue/not feeling well-and was subsequently admitted to the hospitalist service.  Significant events: 1/11-1/12>> admitted for sepsis-blood cultures positive for MRSA-patient signed out AMA 1/14>> admit to [facility]  Significant studies: 1/11>> CT head: No acute intracranial process 1/11>> CT chest/abdomen/pelvis: No acute abnormality in the chest/abdomen/pelvis 1/15>>  TTE: EF 60-65%, no obvious vegetation seen. 1/16>> CT right shoulder: No acute osseous abnormality.  Significant microbiology data: 1/14>> COVID/influenza/RSV PCR: Negative 1/11>> blood culture: MRSA 1/14>> blood culture: Staph aureus  Procedures: None  Consults: ID  Subjective: Complains of soreness in the right shoulder/right chest area.  Otherwise has no complaints.  Objective: Vitals: Blood pressure (!) 148/74, pulse 80, temperature 98.3 F (36.8 C), temperature source Oral, resp. rate 20, height 6' (1.829 m), weight 110.4 kg, SpO2 93%.   Exam: Gen Exam:Alert awake-not in any distress HEENT:atraumatic, normocephalic Chest: B/L clear to auscultation anteriorly CVS:S1S2 regular Abdomen:soft non tender, non distended Extremities:B/L BKA Neurology: Non focal Skin: no rash  Pertinent Labs/Radiology:    Latest Ref Rng & Units 08/15/2024    1:44 AM 08/14/2024   12:10 PM 08/12/2024    5:30 AM  CBC  WBC 4.0 - 10.5 K/uL 2.6  3.5  4.0   Hemoglobin 13.0 - 17.0 g/dL 89.8  88.4  89.2    Hematocrit 39.0 - 52.0 % 29.4  32.7  32.1   Platelets 150 - 400 K/uL 42  53  54     Lab Results  Component Value Date   NA 129 (L) 08/15/2024   K 3.4 (L) 08/15/2024   CL 98 08/15/2024   CO2 20 (L) 08/15/2024      Assessment/Plan: MRSA bacteremia Given 1 dose of oritavancin as patient was threatening to leave AMA 1/15-however today claims that he will remain in the hospital for the next several days and does not intend to leave AMA TTE negative for vegetations Await further recommendations from infectious disease. 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) Compensated  History of pulmonary embolism-2024 Noncompliant to Eliquis  Furthermore-has chronic thrombocytopenia and recent GI bleed in December 2025  PAF Telemetry monitoring Non compliant to Eliquis  See above  DM-2 (A1c 5.7on  03/24/2024) SSI  Recent Labs    08/15/24 1602 08/15/24 2142 08/16/24 0758  GLUCAP 220* 143* 123*     Bipolar disorder/depression/anxiety Trazodone  Supportive care  EtOH use No withdrawal symptoms currently Ativan  per [facility] protocol  Cocaine use Claims he uses cocaine several times a month-very vague about the route he uses As needed Ativan  for withdrawal symptoms  Homelessness TOC eval  Noncompliance Left AMA recently Claims that he does not have any major  medical problems and does not take any prescription medications-however has above-noted history and is supposed to be on above-noted medications. Has been counseled  Class 1 Obesity Estimated body mass index is 33.01 kg/m as calculated from the  following:   Height as of this encounter: 6' (1.829 m).   Weight as of this encounter: 110.4 kg.   Code status:   Code Status: Full Code   DVT Prophylaxis: SCD's    Family Communication: None at bedside   Disposition Plan: Status is: Inpatient Remains inpatient appropriate because: Severity of illness   Planned Discharge Destination:Home   Diet: Diet Order             Diet regular Room service appropriate? Yes with Assist; Fluid consistency: Thin  Diet effective now                     Antimicrobial agents: Anti-infectives (From admission, onward)    Start     Dose/Rate Route Frequency Ordered Stop   08/15/24 1130  dalbavancin (DALVANCE ) 1,500 mg in dextrose  5 % 500 mL IVPB        1,500 mg 967.7 mL/hr over 31 Minutes Intravenous Once 08/15/24 1123 08/15/24 1253   08/14/24 1630  DAPTOmycin  (CUBICIN ) IVPB 700 mg/131mL premix  Status:  Discontinued        8 mg/kg  85.1 kg (Adjusted) 200 mL/hr over 30 Minutes Intravenous Daily 08/14/24 1620 08/15/24 1123        MEDICATIONS: Scheduled Meds:  insulin  aspart  0-15 Units Subcutaneous TID WC   pantoprazole   40 mg Oral BID AC   sodium chloride  flush  3 mL Intravenous Q12H   Continuous Infusions: PRN Meds:.acetaminophen  **OR** acetaminophen , polyethylene glycol, traZODone    I have personally reviewed following labs and imaging studies  LABORATORY DATA: CBC: Recent Labs  Lab 08/11/24 1702 08/12/24 0530 08/14/24 1210 08/15/24 0144  WBC 6.6 4.0 3.5* 2.6*  NEUTROABS 5.7  --  3.0  --   HGB 12.9* 10.7* 11.5* 10.1*  HCT 36.6* 32.1* 32.7* 29.4*  MCV 96.3 99.1 94.8 97.0  PLT 77* 54* 53* 42*    Basic Metabolic Panel: Recent Labs  Lab 08/11/24 1702 08/11/24 2320 08/12/24 0530 08/14/24 1430 08/15/24 0144  NA 132*  --  133* 133* 129*  K 3.6  --  3.2* 3.3* 3.4*  CL 98  --  103 100 98  CO2 21*  --  18* 20* 20*  GLUCOSE 175*  --  119* 159* 214*  BUN 11  --  12 11 13   CREATININE 0.82  --  0.79 0.69  0.76  CALCIUM  8.9  --  7.6* 8.2* 7.9*  MG  --  1.1* 1.7 1.5*  --     GFR: Estimated Creatinine Clearance: 119.7 mL/min (by C-G formula based on SCr of 0.76 mg/dL).  Liver Function Tests: Recent Labs  Lab 08/11/24 1702 08/14/24 1430 08/15/24 0144  AST 36 43* 32  ALT 14 19 16   ALKPHOS 95 91 88  BILITOT 2.3* 1.9* 1.8*  PROT 7.4 6.3* 5.5*  ALBUMIN 3.7 3.2* 2.9*   No results for input(s): LIPASE, AMYLASE in the last 168 hours. Recent Labs  Lab 08/11/24 1702  AMMONIA 53*    Coagulation Profile: Recent Labs  Lab 08/11/24 1702 08/15/24 0144  INR 1.5* 1.7*    Cardiac Enzymes: No results for input(s): CKTOTAL, CKMB, CKMBINDEX, TROPONINI in the last 168 hours.  BNP (last 3 results) No results for input(s): PROBNP in the last 8760  hours.  Lipid Profile: No results for input(s): CHOL, HDL, LDLCALC, TRIG, CHOLHDL, LDLDIRECT in the last 72 hours.  Thyroid Function Tests: No results for input(s): TSH, T4TOTAL, FREET4, T3FREE, THYROIDAB in the last 72 hours.  Anemia Panel: No results for input(s): VITAMINB12, FOLATE, FERRITIN, TIBC, IRON, RETICCTPCT in the last 72 hours.  Urine analysis:    Component Value Date/Time   COLORURINE AMBER (A) 08/11/2024 1827   APPEARANCEUR CLEAR 08/11/2024 1827   LABSPEC 1.024 08/11/2024 1827   PHURINE 6.0 08/11/2024 1827   GLUCOSEU NEGATIVE 08/11/2024 1827   HGBUR LARGE (A) 08/11/2024 1827   BILIRUBINUR NEGATIVE 08/11/2024 1827   KETONESUR NEGATIVE 08/11/2024 1827   PROTEINUR 30 (A) 08/11/2024 1827   NITRITE NEGATIVE 08/11/2024 1827   LEUKOCYTESUR NEGATIVE 08/11/2024 1827    Sepsis Labs: Lactic Acid, Venous    Component Value Date/Time   LATICACIDVEN 1.4 08/14/2024 1321    MICROBIOLOGY: Recent Results (from the past 240 hours)  Resp panel by RT-PCR (RSV, Flu A&B, Covid) Anterior Nasal Swab     Status: None   Collection Time: 08/11/24  3:50 PM   Specimen: Anterior Nasal Swab   Result Value Ref Range Status   SARS Coronavirus 2 by RT PCR NEGATIVE NEGATIVE Final    Comment: (NOTE) SARS-CoV-2 target nucleic acids are NOT DETECTED.  The SARS-CoV-2 RNA is generally detectable in upper respiratory specimens during the acute phase of infection. The lowest concentration of SARS-CoV-2 viral copies this assay can detect is 138 copies/mL. A negative result does not preclude SARS-Cov-2 infection and should not be used as the sole basis for treatment or other patient management decisions. A negative result may occur with  improper specimen collection/handling, submission of specimen other than nasopharyngeal swab, presence of viral mutation(s) within the areas targeted by this assay, and inadequate number of viral copies(<138 copies/mL). A negative result must be combined with clinical observations, patient history, and epidemiological information. The expected result is Negative.  Fact Sheet for Patients:  bloggercourse.com  Fact Sheet for Healthcare Providers:  seriousbroker.it  This test is no t yet approved or cleared by the [place]  FDA and  has been authorized for detection and/or diagnosis of SARS-CoV-2 by FDA under an Emergency Use Authorization (EUA). This EUA will remain  in effect (meaning this test can be used) for the duration of the COVID-19 declaration under Section 564(b)(1) of the Act, 21 U.S.C.section 360bbb-3(b)(1), unless the authorization is terminated  or revoked sooner.       Influenza A by PCR NEGATIVE NEGATIVE Final   Influenza B by PCR NEGATIVE NEGATIVE Final    Comment: (NOTE) The Xpert Xpress SARS-CoV-2/FLU/RSV plus assay is intended as an aid in the diagnosis of influenza from Nasopharyngeal swab specimens and should not be used as a sole basis for treatment. Nasal washings and aspirates are unacceptable for Xpert Xpress SARS-CoV-2/FLU/RSV testing.  Fact Sheet for  Patients: bloggercourse.com  Fact Sheet for Healthcare Providers: seriousbroker.it  This test is not yet approved or cleared by the [place]  FDA and has been authorized for detection and/or diagnosis of SARS-CoV-2 by FDA under an Emergency Use Authorization (EUA). This EUA will remain in effect (meaning this test can be used) for the duration of the COVID-19 declaration under Section 564(b)(1) of the Act, 21 U.S.C. section 360bbb-3(b)(1), unless the authorization is terminated or revoked.     Resp Syncytial Virus by PCR NEGATIVE NEGATIVE Final    Comment: (NOTE) Fact Sheet for Patients: bloggercourse.com  Fact Sheet for Healthcare  Providers: seriousbroker.it  This test is not yet approved or cleared by the [place]  FDA and has been authorized for detection and/or diagnosis of SARS-CoV-2 by FDA under an Emergency Use Authorization (EUA). This EUA will remain in effect (meaning this test can be used) for the duration of the COVID-19 declaration under Section 564(b)(1) of the Act, 21 U.S.C. section 360bbb-3(b)(1), unless the authorization is terminated or revoked.  Performed at [facility], [address], [place], [place] [address]   Blood Culture (routine x 2)     Status: Abnormal (Preliminary result)   Collection Time: 08/11/24  5:02 PM   Specimen: BLOOD RIGHT ARM  Result Value Ref Range Status   Specimen Description   Final    BLOOD RIGHT ARM Performed at [facility], [address], [place], [place] [address]    Special Requests   Final    BOTTLES DRAWN AEROBIC AND ANAEROBIC Blood Culture results may not be optimal due to an inadequate volume of blood received in culture bottles Performed at [facility], [address], [place], [place] [address]    Culture  Setup Time   Final    GRAM POSITIVE COCCI IN BOTH AEROBIC  AND ANAEROBIC BOTTLES CRITICAL RESULT CALLED TO, READ BACK BY AND VERIFIED WITH: N. GLOGOVAC PHARMD, AT 9081 08/12/24 D. JEARLEAN    Culture (A)  Final    METHICILLIN RESISTANT STAPHYLOCOCCUS AUREUS Sent to [facility] for further susceptibility testing. Performed at [facility], [address] [place], [place] [address]    Report Status PENDING  Incomplete   Organism ID, Bacteria METHICILLIN RESISTANT STAPHYLOCOCCUS AUREUS  Final      Susceptibility   Methicillin resistant staphylococcus aureus - MIC*    CIPROFLOXACIN  >=8 RESISTANT Resistant     ERYTHROMYCIN >=8 RESISTANT Resistant     GENTAMICIN <=0.5 SENSITIVE Sensitive     OXACILLIN >=4 RESISTANT Resistant     TETRACYCLINE >=16 RESISTANT Resistant     VANCOMYCIN  <=0.5 SENSITIVE Sensitive     TRIMETH /SULFA  >=320 RESISTANT Resistant     CLINDAMYCIN  <=0.25 SENSITIVE Sensitive     RIFAMPIN <=0.5 SENSITIVE Sensitive     Inducible Clindamycin  NEGATIVE Sensitive     LINEZOLID 2 SENSITIVE Sensitive     * METHICILLIN RESISTANT STAPHYLOCOCCUS AUREUS  Blood Culture ID Panel (Reflexed)     Status: Abnormal   Collection Time: 08/11/24  5:02 PM  Result Value Ref Range Status   Enterococcus faecalis NOT DETECTED NOT DETECTED Final   Enterococcus Faecium NOT DETECTED NOT DETECTED Final   Listeria monocytogenes NOT DETECTED NOT DETECTED Final   Staphylococcus species DETECTED (A) NOT DETECTED Final    Comment: CRITICAL RESULT CALLED TO, READ BACK BY AND VERIFIED WITH: N. GLOGOVAC PHARMD, AT 9081 08/12/24 D. VANHOOK    Staphylococcus aureus (BCID) DETECTED (A) NOT DETECTED Final    Comment: Methicillin (oxacillin)-resistant Staphylococcus aureus (MRSA). MRSA is predictably resistant to beta-lactam antibiotics (except ceftaroline). Preferred therapy is vancomycin  unless clinically contraindicated. Patient requires contact precautions if  hospitalized. CRITICAL RESULT CALLED TO, READ BACK BY AND VERIFIED WITH: N. GLOGOVAC PHARMD, AT 9081  08/12/24 D. VANHOOK    Staphylococcus epidermidis NOT DETECTED NOT DETECTED Final   Staphylococcus lugdunensis NOT DETECTED NOT DETECTED Final   Streptococcus species NOT DETECTED NOT DETECTED Final   Streptococcus agalactiae NOT DETECTED NOT DETECTED Final   Streptococcus pneumoniae NOT DETECTED NOT DETECTED Final   Streptococcus pyogenes NOT DETECTED NOT DETECTED Final   A.calcoaceticus-baumannii  NOT DETECTED NOT DETECTED Final   Bacteroides fragilis NOT DETECTED NOT DETECTED Final   Enterobacterales NOT DETECTED NOT DETECTED Final   Enterobacter cloacae complex NOT DETECTED NOT DETECTED Final   Escherichia coli NOT DETECTED NOT DETECTED Final   Klebsiella aerogenes NOT DETECTED NOT DETECTED Final   Klebsiella oxytoca NOT DETECTED NOT DETECTED Final   Klebsiella pneumoniae NOT DETECTED NOT DETECTED Final   Proteus species NOT DETECTED NOT DETECTED Final   Salmonella species NOT DETECTED NOT DETECTED Final   Serratia marcescens NOT DETECTED NOT DETECTED Final   Haemophilus influenzae NOT DETECTED NOT DETECTED Final   Neisseria meningitidis NOT DETECTED NOT DETECTED Final   Pseudomonas aeruginosa NOT DETECTED NOT DETECTED Final   Stenotrophomonas maltophilia NOT DETECTED NOT DETECTED Final   Candida albicans NOT DETECTED NOT DETECTED Final   Candida auris NOT DETECTED NOT DETECTED Final   Candida glabrata NOT DETECTED NOT DETECTED Final   Candida krusei NOT DETECTED NOT DETECTED Final   Candida parapsilosis NOT DETECTED NOT DETECTED Final   Candida tropicalis NOT DETECTED NOT DETECTED Final   Cryptococcus neoformans/gattii NOT DETECTED NOT DETECTED Final   Meth resistant mecA/C and MREJ DETECTED (A) NOT DETECTED Final    Comment: CRITICAL RESULT CALLED TO, READ BACK BY AND VERIFIED WITH: N. GLOGOVAC PHARMD, AT 9081 08/12/24 CHARM CARMIN Performed at [facility], [address] [place], [place] [address]   MIC (1 Drug)-     Status: None   Collection Time: 08/11/24  5:02 PM   Result Value Ref Range Status   Min Inhibitory Conc (1 Drug) BLOOD  Final   Source [id number] mrsa dapto blood  Corrected    Comment: Performed at [facility], [address], [place], [place] [address] CORRECTED ON 01/15 AT 1315: PREVIOUSLY REPORTED AS BLOOD/ MRSA/ DAPTOMYCIN    MIC (1 Drug)-     Status: None (Preliminary result)   Collection Time: 08/11/24  5:02 PM  Result Value Ref Range Status   Min Inhibitory Conc (1 Drug) PENDING  Incomplete   Source MRSA/DAPTOMYCIN / BLOOD  Final    Comment: Performed at [facility], [address] [place], [place] [address]  Respiratory (~20 pathogens) panel by PCR     Status: None   Collection Time: 08/12/24  3:30 AM   Specimen: Nasopharyngeal Swab; Respiratory  Result Value Ref Range Status   Adenovirus NOT DETECTED NOT DETECTED Final   Coronavirus 229E NOT DETECTED NOT DETECTED Final    Comment: (NOTE) The Coronavirus on the Respiratory Panel, DOES NOT test for the novel  Coronavirus (2019 nCoV)    Coronavirus HKU1 NOT DETECTED NOT DETECTED Final   Coronavirus NL63 NOT DETECTED NOT DETECTED Final   Coronavirus OC43 NOT DETECTED NOT DETECTED Final   Metapneumovirus NOT DETECTED NOT DETECTED Final   Rhinovirus / Enterovirus NOT DETECTED NOT DETECTED Final   Influenza A NOT DETECTED NOT DETECTED Final   Influenza B NOT DETECTED NOT DETECTED Final   Parainfluenza Virus 1 NOT DETECTED NOT DETECTED Final   Parainfluenza Virus 2 NOT DETECTED NOT DETECTED Final   Parainfluenza Virus 3 NOT DETECTED NOT DETECTED Final   Parainfluenza Virus 4 NOT DETECTED NOT DETECTED Final   Respiratory Syncytial Virus NOT DETECTED NOT DETECTED Final   Bordetella pertussis NOT DETECTED NOT DETECTED Final   Bordetella Parapertussis NOT DETECTED NOT DETECTED Final   Chlamydophila pneumoniae NOT DETECTED NOT DETECTED Final   Mycoplasma pneumoniae NOT DETECTED NOT DETECTED Final    Comment:  Performed at [facility], [address]  [place], [place] [address]  Culture, blood (routine x 2)     Status: Abnormal (Preliminary result)   Collection Time: 08/14/24 11:09 AM   Specimen: BLOOD LEFT HAND  Result Value Ref Range Status   Specimen Description BLOOD LEFT HAND  Final   Special Requests   Final    BOTTLES DRAWN AEROBIC AND ANAEROBIC Blood Culture results may not be optimal due to an inadequate volume of blood received in culture bottles   Culture  Setup Time   Final    GRAM POSITIVE COCCI IN BOTH AEROBIC AND ANAEROBIC BOTTLES CRITICAL RESULT CALLED TO, READ BACK BY AND VERIFIED WITH:  MIRANDA VBYRK PHARM D 08/15/2024 BY DD @ 9487    Culture (A)  Final    STAPHYLOCOCCUS AUREUS SUSCEPTIBILITIES TO FOLLOW Performed at [facility], [address], [place], [place] [address]    Report Status PENDING  Incomplete  Resp panel by RT-PCR (RSV, Flu A&B, Covid)     Status: None   Collection Time: 08/14/24 11:09 AM   Specimen: Nasal Swab  Result Value Ref Range Status   SARS Coronavirus 2 by RT PCR NEGATIVE NEGATIVE Final   Influenza A by PCR NEGATIVE NEGATIVE Final   Influenza B by PCR NEGATIVE NEGATIVE Final    Comment: (NOTE) The Xpert Xpress SARS-CoV-2/FLU/RSV plus assay is intended as an aid in the diagnosis of influenza from Nasopharyngeal swab specimens and should not be used as a sole basis for treatment. Nasal washings and aspirates are unacceptable for Xpert Xpress SARS-CoV-2/FLU/RSV testing.  Fact Sheet for Patients: bloggercourse.com  Fact Sheet for Healthcare Providers: seriousbroker.it  This test is not yet approved or cleared by the [place]  FDA and has been authorized for detection and/or diagnosis of SARS-CoV-2 by FDA under an Emergency Use Authorization (EUA). This EUA will remain in effect (meaning this test can be used) for the duration of the COVID-19 declaration under Section 564(b)(1) of the Act, 21 U.S.C. section 360bbb-3(b)(1), unless  the authorization is terminated or revoked.     Resp Syncytial Virus by PCR NEGATIVE NEGATIVE Final    Comment: (NOTE) Fact Sheet for Patients: bloggercourse.com  Fact Sheet for Healthcare Providers: seriousbroker.it  This test is not yet approved or cleared by the [place]  FDA and has been authorized for detection and/or diagnosis of SARS-CoV-2 by FDA under an Emergency Use Authorization (EUA). This EUA will remain in effect (meaning this test can be used) for the duration of the COVID-19 declaration under Section 564(b)(1) of the Act, 21 U.S.C. section 360bbb-3(b)(1), unless the authorization is terminated or revoked.  Performed at [facility], [address] [place], [place] [address]   Blood Culture ID Panel (Reflexed)     Status: Abnormal   Collection Time: 08/14/24 11:09 AM  Result Value Ref Range Status   Enterococcus faecalis NOT DETECTED NOT DETECTED Final   Enterococcus Faecium NOT DETECTED NOT DETECTED Final   Listeria monocytogenes NOT DETECTED NOT DETECTED Final   Staphylococcus species DETECTED (A) NOT DETECTED Final    Comment: CRITICAL RESULT CALLED TO, READ BACK BY AND VERIFIED WITH:  MIRANDA VBYRK PHARM D 08/15/2024 BY DD @ 9487    Staphylococcus aureus (BCID) DETECTED (A) NOT DETECTED Final    Comment: Methicillin (oxacillin)-resistant Staphylococcus aureus (MRSA). MRSA is predictably resistant to beta-lactam antibiotics (except ceftaroline). Preferred therapy is vancomycin  unless clinically contraindicated. Patient requires contact precautions if  hospitalized. CRITICAL RESULT CALLED  TO, READ BACK BY AND VERIFIED WITH:  MIRANDA VBYRK PHARM D 08/15/2024 BY DD @ 0512    Staphylococcus epidermidis NOT DETECTED NOT DETECTED Final   Staphylococcus lugdunensis NOT DETECTED NOT DETECTED Final   Streptococcus species NOT DETECTED NOT DETECTED Final   Streptococcus agalactiae NOT DETECTED NOT DETECTED  Final   Streptococcus pneumoniae NOT DETECTED NOT DETECTED Final   Streptococcus pyogenes NOT DETECTED NOT DETECTED Final   A.calcoaceticus-baumannii NOT DETECTED NOT DETECTED Final   Bacteroides fragilis NOT DETECTED NOT DETECTED Final   Enterobacterales NOT DETECTED NOT DETECTED Final   Enterobacter cloacae complex NOT DETECTED NOT DETECTED Final   Escherichia coli NOT DETECTED NOT DETECTED Final   Klebsiella aerogenes NOT DETECTED NOT DETECTED Final   Klebsiella oxytoca NOT DETECTED NOT DETECTED Final   Klebsiella pneumoniae NOT DETECTED NOT DETECTED Final   Proteus species NOT DETECTED NOT DETECTED Final   Salmonella species NOT DETECTED NOT DETECTED Final   Serratia marcescens NOT DETECTED NOT DETECTED Final   Haemophilus influenzae NOT DETECTED NOT DETECTED Final   Neisseria meningitidis NOT DETECTED NOT DETECTED Final   Pseudomonas aeruginosa NOT DETECTED NOT DETECTED Final   Stenotrophomonas maltophilia NOT DETECTED NOT DETECTED Final   Candida albicans NOT DETECTED NOT DETECTED Final   Candida auris NOT DETECTED NOT DETECTED Final   Candida glabrata NOT DETECTED NOT DETECTED Final   Candida krusei NOT DETECTED NOT DETECTED Final   Candida parapsilosis NOT DETECTED NOT DETECTED Final   Candida tropicalis NOT DETECTED NOT DETECTED Final   Cryptococcus neoformans/gattii NOT DETECTED NOT DETECTED Final   Meth resistant mecA/C and MREJ DETECTED (A) NOT DETECTED Final    Comment: CRITICAL RESULT CALLED TO, READ BACK BY AND VERIFIED WITH:  MIRANDA VBYRK PHARM D 08/15/2024 BY DD @ [phone] Performed at [facility], [address] [place], [place] [address]   Culture, blood (routine x 2)     Status: Abnormal (Preliminary result)   Collection Time: 08/14/24 11:14 AM   Specimen: BLOOD RIGHT WRIST  Result Value Ref Range Status   Specimen Description BLOOD RIGHT WRIST  Final   Special Requests   Final    BOTTLES DRAWN AEROBIC AND ANAEROBIC Blood Culture results may not be optimal  due to an inadequate volume of blood received in culture bottles   Culture  Setup Time   Final    GRAM POSITIVE COCCI IN BOTH AEROBIC AND ANAEROBIC BOTTLES CRITICAL VALUE NOTED.  VALUE IS CONSISTENT WITH PREVIOUSLY REPORTED AND CALLED VALUE. Performed at [facility], [address], [place], [place] [address]    Culture STAPHYLOCOCCUS AUREUS (A)  Final   Report Status PENDING  Incomplete    RADIOLOGY STUDIES/RESULTS: CT SHOULDER RIGHT W CONTRAST Result Date: 08/16/2024 EXAM: CT RIGHT SHOULDER, WITH IV CONTRAST 08/16/2024 06:11:55 AM TECHNIQUE: Axial images were acquired through the right shoulder with IV contrast. Reformatted images were reviewed. 75 mL of iohexol  (OMNIPAQUE ) 350 MG/ML injection was administered intravenously. Automated exposure control, iterative reconstruction, and/or weight based adjustment of the mA/kV was utilized to reduce the radiation dose to as low as reasonably achievable. COMPARISON: CT with IV contrast from 08/11/2024 and 05/24/2024. CLINICAL HISTORY: Shoulder pain. FINDINGS: BONES: There is no evidence of fractures or focal suspicious bone lesions. Subcortical cystic changes extend along the greater tuberosity likely degenerative etiology. JOINTS: No dislocation. The acromioclavicular and glenohumeral joints are intact. There is no substantial joint effusion. There is slight periarticular spurring of the acromioclavicular and glenohumeral joints, mild narrowing  of the glenohumeral joint. There is no erosive arthropathy. SOFT TISSUES: The rotator cuff muscles are normal in volume. The tendons are not well evaluated with CT, but appear to be grossly intact. There are multiple enhancing venous collateral vessels in the supraclavicular fossa and the deltoid and pectoralis major muscles. LUNGS AND PLEURAL SPACES: Pleuroparenchymal scarring opacities and ground glass opacity in the right upper lobe are unchanged. There is increased small layering right pleural effusion  noted since 08/11/2024. IMPRESSION: 1. No acute osseous abnormality. Mild degenerative features. 2. Multiple enhancing venous collateral vessels in the supraclavicular fossa and the deltoid and pectoralis major muscles. 3. Increased small layering right pleural effusion since 08/11/2024. Electronically signed by: Francis Quam MD 08/16/2024 06:30 AM EST RP Workstation: [id number]   ECHOCARDIOGRAM COMPLETE Result Date: 08/15/2024    ECHOCARDIOGRAM REPORT   Patient Name:   Cory Palmer Date of Exam: 08/15/2024 Medical Rec #:  [id number]     Height:       68.0 in Accession #:    [id number]    Weight:       242.7 lb Date of Birth:  08-Oct-1960    BSA:          2.219 m Patient Age:    63 years      BP:           123/92 mmHg Patient Gender: M             HR:           75 bpm. Exam Location:  Inpatient Procedure: 2D Echo, Cardiac Doppler and Color Doppler (Both Spectral and Color            Flow Doppler were utilized during procedure). Indications:    Bacteremia  History:        Patient has prior history of Echocardiogram examinations, most                 recent 03/26/2024. Stroke, Arrythmias:Atrial Fibrillation and                 Atrial Flutter, Signs/Symptoms:Bacteremia; Risk                 Factors:Diabetes, Hypertension and Current Smoker.  Sonographer:    Juliene Rucks Referring Phys: [id number] MARSA KATHEE MELVIN  Sonographer Comments: Patient is obese. IMPRESSIONS  1. Left ventricular ejection fraction, by estimation, is 60 to 65%. The left ventricle has normal function. The left ventricle has no regional wall motion abnormalities. The left ventricular internal cavity size was mildly dilated. There is mild left ventricular hypertrophy. Left ventricular diastolic parameters are indeterminate.  2. Right ventricular systolic function is normal. The right ventricular size is normal.  3. The mitral valve is normal in structure. No evidence of mitral valve regurgitation. No evidence of mitral stenosis.  4. The aortic valve  is tricuspid. Aortic valve regurgitation is not visualized. Aortic valve sclerosis/calcification is present, without any evidence of aortic stenosis.  5. The inferior vena cava is normal in size with greater than 50% respiratory variability, suggesting right atrial pressure of 3 mmHg. FINDINGS  Left Ventricle: Left ventricular ejection fraction, by estimation, is 60 to 65%. The left ventricle has normal function. The left ventricle has no regional wall motion abnormalities. The left ventricular internal cavity size was mildly dilated. There is  mild left ventricular hypertrophy. Left ventricular diastolic parameters are indeterminate. Right Ventricle: The right ventricular size is normal. Right ventricular systolic function is normal. Left Atrium: Left atrial  size was normal in size. Right Atrium: Right atrial size was normal in size. Pericardium: There is no evidence of pericardial effusion. Mitral Valve: The mitral valve is normal in structure. Mild mitral annular calcification. No evidence of mitral valve regurgitation. No evidence of mitral valve stenosis. Tricuspid Valve: The tricuspid valve is normal in structure. Tricuspid valve regurgitation is trivial. No evidence of tricuspid stenosis. Aortic Valve: The aortic valve is tricuspid. Aortic valve regurgitation is not visualized. Aortic valve sclerosis/calcification is present, without any evidence of aortic stenosis. Pulmonic Valve: The pulmonic valve was not well visualized. Pulmonic valve regurgitation is not visualized. No evidence of pulmonic stenosis. Aorta: The aortic root is normal in size and structure. Venous: The inferior vena cava is normal in size with greater than 50% respiratory variability, suggesting right atrial pressure of 3 mmHg. IAS/Shunts: The interatrial septum was not well visualized.  LEFT VENTRICLE PLAX 2D LVIDd:         5.60 cm   Diastology LVIDs:         4.30 cm   LV e' medial:    11.20 cm/s LV PW:         1.40 cm   LV E/e' medial:   12.0 LV IVS:        1.20 cm   LV e' lateral:   12.50 cm/s LVOT diam:     2.20 cm   LV E/e' lateral: 10.7 LV SV:         78 LV SV Index:   35 LVOT Area:     3.80 cm  RIGHT VENTRICLE             IVC RV Basal diam:  4.40 cm     IVC diam: 1.70 cm RV Mid diam:    3.80 cm RV S prime:     16.40 cm/s TAPSE (M-mode): 2.3 cm LEFT ATRIUM           Index LA diam:      5.20 cm 2.34 cm/m LA Vol (A2C): 85.3 ml 38.44 ml/m LA Vol (A4C): 81.7 ml 36.82 ml/m  AORTIC VALVE LVOT Vmax:   127.00 cm/s LVOT Vmean:  86.500 cm/s LVOT VTI:    0.206 m  AORTA Ao Root diam: 3.50 cm MITRAL VALVE                TRICUSPID VALVE MV Area (PHT): 3.72 cm     TR Peak grad:   25.4 mmHg MV Decel Time: 204 msec     TR Vmax:        252.00 cm/s MV E velocity: 134.00 cm/s MV A velocity: 33.50 cm/s   SHUNTS MV E/A ratio:  4.00         Systemic VTI:  0.21 m                             Systemic Diam: 2.20 cm Redell Shallow MD Electronically signed by Redell Shallow MD Signature Date/Time: 08/15/2024/2:40:02 PM    Final    DG Chest 1 View Result Date: 08/14/2024 EXAM: 1 VIEW(S) XRAY OF THE CHEST 08/14/2024 11:59:00 AM COMPARISON: 08/11/2024 CLINICAL HISTORY: The patient presents with fever and cough. FINDINGS: LUNGS AND PLEURA: Low lung volumes with bronchovascular crowding. Mild diffuse interstitial opacities. Elevated right hemidiaphragm more pronounced than on the previous study. No pleural effusion. No pneumothorax. HEART AND MEDIASTINUM: No acute abnormality of the cardiac and mediastinal silhouettes. BONES AND SOFT TISSUES: No acute osseous  abnormality. IMPRESSION: 1. Low lung volumes with bronchovascular crowding and mild diffuse interstitial opacities. 2. Elevated right hemidiaphragm, more pronounced than on the previous study. Electronically signed by: Evalene Coho MD 08/14/2024 12:42 PM EST RP Workstation: [id number]     LOS: 2 days   Donalda Applebaum, MD  [facility]    To contact the attending provider between 7A-7P or the  covering provider during after hours 7P-7A, please log into the web site [email or web address] and access using universal [place] password for that web site. If you do not have the password, please call the hospital operator.  08/16/2024, 9:31 AM    "

## 2024-08-16 NOTE — Progress Notes (Signed)
 Patient sats sustaining to 85-86% in RA while sleeping. So, placed on oxygen 2LNC for now.

## 2024-08-16 NOTE — Progress Notes (Signed)
 "       Subjective: No new complaints   Antibiotics:  Anti-infectives (From admission, onward)    Start     Dose/Rate Route Frequency Ordered Stop   08/15/24 1130  dalbavancin (DALVANCE ) 1,500 mg in dextrose  5 % 500 mL IVPB        1,500 mg 967.7 mL/hr over 31 Minutes Intravenous Once 08/15/24 1123 08/15/24 1253   08/14/24 1630  DAPTOmycin  (CUBICIN ) IVPB 700 mg/100mL premix  Status:  Discontinued        8 mg/kg  85.1 kg (Adjusted) 200 mL/hr over 30 Minutes Intravenous Daily 08/14/24 1620 08/15/24 1123       Medications: Scheduled Meds:  insulin  aspart  0-15 Units Subcutaneous TID WC   pantoprazole   40 mg Oral BID AC   sodium chloride  flush  3 mL Intravenous Q12H   Continuous Infusions: PRN Meds:.acetaminophen  **OR** acetaminophen , polyethylene glycol, traZODone     Objective: Weight change:   Intake/Output Summary (Last 24 hours) at 08/16/2024 1725 Last data filed at 08/16/2024 0430 Gross per 24 hour  Intake 3 ml  Output 800 ml  Net -797 ml   Blood pressure 134/77, pulse 79, temperature 98.5 F (36.9 C), temperature source Oral, resp. rate 20, height 6' (1.829 m), weight 110.4 kg, SpO2 94%. Temp:  [97.6 F (36.4 C)-98.5 F (36.9 C)] 98.5 F (36.9 C) (01/16 1149) Pulse Rate:  [66-80] 79 (01/16 1149) Resp:  [16-23] 20 (01/16 1149) BP: (123-149)/(66-78) 134/77 (01/16 1149) SpO2:  [93 %-96 %] 94 % (01/16 1149)  Physical Exam: Physical Exam Constitutional:      Appearance: Normal appearance.  HENT:     Head: Normocephalic and atraumatic.  Eyes:     General:        Right eye: No discharge.        Left eye: No discharge.     Extraocular Movements: Extraocular movements intact.     Pupils: Pupils are equal, round, and reactive to light.  Cardiovascular:     Rate and Rhythm: Normal rate and regular rhythm.     Heart sounds: No murmur heard.    No friction rub. No gallop.  Pulmonary:     Effort: No respiratory distress.     Breath sounds: No wheezing.   Abdominal:     General: There is no distension.  Musculoskeletal:     Cervical back: Normal range of motion and neck supple.  Skin:    General: Skin is warm and dry.  Neurological:     General: No focal deficit present.     Mental Status: He is alert and oriented to person, place, and time.  Psychiatric:        Mood and Affect: Mood normal.        Behavior: Behavior normal.        Thought Content: Thought content normal.        Judgment: Judgment normal.     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 C-Reactive Protein No results for input(s): CRP in the last 72 hours.  Micro Results: Recent Results (from the past 720 hours)  Resp panel by RT-PCR (RSV, Flu A&B, Covid) Anterior Nasal Swab     Status: None   Collection Time: 08/11/24  3:50 PM   Specimen: Anterior Nasal Swab  Result Value Ref Range Status   SARS Coronavirus 2 by RT PCR NEGATIVE NEGATIVE Final    Comment: (NOTE) SARS-CoV-2 target nucleic acids are NOT DETECTED.  The SARS-CoV-2 RNA is generally detectable in upper respiratory specimens during the acute phase of infection. The lowest concentration of SARS-CoV-2 viral copies this assay can detect is 138 copies/mL. A negative result does not preclude SARS-Cov-2 infection and should not be used as the sole basis for treatment or other patient management decisions. A negative result may occur with  improper specimen collection/handling, submission of specimen other than nasopharyngeal swab, presence of viral mutation(s) within  the areas targeted by this assay, and inadequate number of viral copies(<138 copies/mL). A negative result must be combined with clinical observations, patient history, and epidemiological information. The expected result is Negative.  Fact Sheet for Patients:  bloggercourse.com  Fact Sheet for Healthcare Providers:  seriousbroker.it  This test is no t yet approved or cleared by the [place]  FDA and  has been authorized for detection and/or diagnosis of SARS-CoV-2 by FDA under an Emergency Use Authorization (EUA). This EUA will remain  in effect (meaning this test can be used) for the duration of the COVID-19 declaration under Section 564(b)(1) of the Act, 21 U.S.C.section 360bbb-3(b)(1), unless the authorization is terminated  or revoked sooner.       Influenza A by PCR NEGATIVE NEGATIVE Final   Influenza B by PCR NEGATIVE NEGATIVE Final    Comment: (NOTE) The Xpert Xpress SARS-CoV-2/FLU/RSV plus assay is intended as an aid in the diagnosis of influenza from Nasopharyngeal swab specimens and should not be used as a sole basis for treatment. Nasal washings and aspirates are unacceptable for Xpert Xpress SARS-CoV-2/FLU/RSV testing.  Fact Sheet for Patients: bloggercourse.com  Fact Sheet for Healthcare Providers: seriousbroker.it  This test is not yet approved or cleared by the [place]  FDA and has been authorized for detection and/or diagnosis of SARS-CoV-2 by FDA under an Emergency Use Authorization (EUA). This EUA will remain in effect (meaning this test can be used) for the duration of the COVID-19 declaration under Section 564(b)(1) of the Act, 21 U.S.C. section 360bbb-3(b)(1), unless the authorization is terminated or revoked.     Resp Syncytial Virus by PCR NEGATIVE NEGATIVE Final    Comment: (NOTE) Fact Sheet for  Patients: bloggercourse.com  Fact Sheet for Healthcare Providers: seriousbroker.it  This test is not yet approved or cleared by the [place]  FDA and has been authorized for detection and/or diagnosis of SARS-CoV-2 by FDA under an Emergency Use Authorization (EUA). This EUA will remain in effect (meaning this test can be used) for the duration of the COVID-19 declaration under Section 564(b)(1) of the Act, 21 U.S.C. section 360bbb-3(b)(1), unless the authorization is terminated or revoked.  Performed at [facility], [address] [place], [place] [address]   Blood Culture (routine x 2)     Status: Abnormal (Preliminary result)   Collection Time: 08/11/24  5:02 PM   Specimen: BLOOD RIGHT ARM  Result Value Ref Range Status   Specimen Description   Final    BLOOD  RIGHT ARM Performed at [facility], [address], [place], [place] [address]    Special Requests   Final    BOTTLES DRAWN AEROBIC AND ANAEROBIC Blood Culture results may not be optimal due to an inadequate volume of blood received in culture bottles Performed at [facility], [address] [place], [place] [address]    Culture  Setup Time   Final    GRAM POSITIVE COCCI IN BOTH AEROBIC AND ANAEROBIC BOTTLES CRITICAL RESULT CALLED TO, READ BACK BY AND VERIFIED WITH: N. GLOGOVAC PHARMD, AT 9081 08/12/24 D. JEARLEAN    Culture (A)  Final    METHICILLIN RESISTANT STAPHYLOCOCCUS AUREUS Sent to [facility] for further susceptibility testing. Performed at [facility], [address] [place], [place] [address]    Report Status PENDING  Incomplete   Organism ID, Bacteria METHICILLIN RESISTANT STAPHYLOCOCCUS AUREUS  Final      Susceptibility   Methicillin resistant staphylococcus aureus - MIC*    CIPROFLOXACIN  >=8 RESISTANT Resistant     ERYTHROMYCIN >=8 RESISTANT Resistant     GENTAMICIN <=0.5 SENSITIVE Sensitive      OXACILLIN >=4 RESISTANT Resistant     TETRACYCLINE >=16 RESISTANT Resistant     VANCOMYCIN  <=0.5 SENSITIVE Sensitive     TRIMETH /SULFA  >=320 RESISTANT Resistant     CLINDAMYCIN  <=0.25 SENSITIVE Sensitive     RIFAMPIN <=0.5 SENSITIVE Sensitive     Inducible Clindamycin  NEGATIVE Sensitive     LINEZOLID 2 SENSITIVE Sensitive     * METHICILLIN RESISTANT STAPHYLOCOCCUS AUREUS  Blood Culture ID Panel (Reflexed)     Status: Abnormal   Collection Time: 08/11/24  5:02 PM  Result Value Ref Range Status   Enterococcus faecalis NOT DETECTED NOT DETECTED Final   Enterococcus Faecium NOT DETECTED NOT DETECTED Final   Listeria monocytogenes NOT DETECTED NOT DETECTED Final   Staphylococcus species DETECTED (A) NOT DETECTED Final    Comment: CRITICAL RESULT CALLED TO, READ BACK BY AND VERIFIED WITH: N. GLOGOVAC PHARMD, AT 9081 08/12/24 D. VANHOOK    Staphylococcus aureus (BCID) DETECTED (A) NOT DETECTED Final    Comment: Methicillin (oxacillin)-resistant Staphylococcus aureus (MRSA). MRSA is predictably resistant to beta-lactam antibiotics (except ceftaroline). Preferred therapy is vancomycin  unless clinically contraindicated. Patient requires contact precautions if  hospitalized. CRITICAL RESULT CALLED TO, READ BACK BY AND VERIFIED WITH: N. GLOGOVAC PHARMD, AT 9081 08/12/24 D. VANHOOK    Staphylococcus epidermidis NOT DETECTED NOT DETECTED Final   Staphylococcus lugdunensis NOT DETECTED NOT DETECTED Final   Streptococcus species NOT DETECTED NOT DETECTED Final   Streptococcus agalactiae NOT DETECTED NOT DETECTED Final   Streptococcus pneumoniae NOT DETECTED NOT DETECTED Final   Streptococcus pyogenes NOT DETECTED NOT DETECTED Final   A.calcoaceticus-baumannii NOT DETECTED NOT DETECTED Final   Bacteroides fragilis NOT DETECTED NOT DETECTED Final   Enterobacterales NOT DETECTED NOT DETECTED Final   Enterobacter cloacae complex NOT DETECTED NOT DETECTED Final   Escherichia coli NOT DETECTED NOT  DETECTED Final   Klebsiella aerogenes NOT DETECTED NOT DETECTED Final   Klebsiella oxytoca NOT DETECTED NOT DETECTED Final   Klebsiella pneumoniae NOT DETECTED NOT DETECTED Final   Proteus species NOT DETECTED NOT DETECTED Final   Salmonella species NOT DETECTED NOT DETECTED Final   Serratia marcescens NOT DETECTED NOT DETECTED Final   Haemophilus influenzae NOT DETECTED NOT DETECTED Final   Neisseria meningitidis NOT DETECTED NOT DETECTED Final   Pseudomonas aeruginosa NOT DETECTED NOT DETECTED Final   Stenotrophomonas maltophilia NOT DETECTED NOT DETECTED  Final   Candida albicans NOT DETECTED NOT DETECTED Final   Candida auris NOT DETECTED NOT DETECTED Final   Candida glabrata NOT DETECTED NOT DETECTED Final   Candida krusei NOT DETECTED NOT DETECTED Final   Candida parapsilosis NOT DETECTED NOT DETECTED Final   Candida tropicalis NOT DETECTED NOT DETECTED Final   Cryptococcus neoformans/gattii NOT DETECTED NOT DETECTED Final   Meth resistant mecA/C and MREJ DETECTED (A) NOT DETECTED Final    Comment: CRITICAL RESULT CALLED TO, READ BACK BY AND VERIFIED WITH: N. GLOGOVAC PHARMD, AT 9081 08/12/24 CHARM CARMIN Performed at [facility], [address] [place], [place] [address]   MIC (1 Drug)-     Status: None   Collection Time: 08/11/24  5:02 PM  Result Value Ref Range Status   Min Inhibitory Conc (1 Drug) BLOOD  Final   Source [id number] mrsa dapto blood  Corrected    Comment: Performed at [facility], [address], [place], [place] [address] CORRECTED ON 01/15 AT 1315: PREVIOUSLY REPORTED AS BLOOD/ MRSA/ DAPTOMYCIN    MIC (1 Drug)-     Status: Abnormal   Collection Time: 08/11/24  5:02 PM  Result Value Ref Range Status   Min Inhibitory Conc (1 Drug) Preliminary report (A)  Final    Comment: (NOTE) Performed At: [facility] [address] [place], [place] [id number] Jennette Shorter MD Ey:[phone]    Source MRSA/DAPTOMYCIN / BLOOD  Final    Comment:  Performed at [facility], [address], [place], [place] [address]  MIC Result     Status: Abnormal   Collection Time: 08/11/24  5:02 PM  Result Value Ref Range Status   Result 1 (MIC) Comment (A)  Final    Comment: (NOTE) Methicillin - resistant Staphylococcus aureus Identification performed by account, not confirmed by this laboratory. DAPTOMYCIN  Performed At: [facility] [address] [place], [place] [id number] Jennette Shorter MD Ey:[phone]   Respiratory (~20 pathogens) panel by PCR     Status: None   Collection Time: 08/12/24  3:30 AM   Specimen: Nasopharyngeal Swab; Respiratory  Result Value Ref Range Status   Adenovirus NOT DETECTED NOT DETECTED Final   Coronavirus 229E NOT DETECTED NOT DETECTED Final    Comment: (NOTE) The Coronavirus on the Respiratory Panel, DOES NOT test for the novel  Coronavirus (2019 nCoV)    Coronavirus HKU1 NOT DETECTED NOT DETECTED Final   Coronavirus NL63 NOT DETECTED NOT DETECTED Final   Coronavirus OC43 NOT DETECTED NOT DETECTED Final   Metapneumovirus NOT DETECTED NOT DETECTED Final   Rhinovirus / Enterovirus NOT DETECTED NOT DETECTED Final   Influenza A NOT DETECTED NOT DETECTED Final   Influenza B NOT DETECTED NOT DETECTED Final   Parainfluenza Virus 1 NOT DETECTED NOT DETECTED Final   Parainfluenza Virus 2 NOT DETECTED NOT DETECTED Final   Parainfluenza Virus 3 NOT DETECTED NOT DETECTED Final   Parainfluenza Virus 4 NOT DETECTED NOT DETECTED Final   Respiratory Syncytial Virus NOT DETECTED NOT DETECTED Final   Bordetella pertussis NOT DETECTED NOT DETECTED Final   Bordetella Parapertussis NOT DETECTED NOT DETECTED Final   Chlamydophila pneumoniae NOT DETECTED NOT DETECTED Final   Mycoplasma pneumoniae NOT DETECTED NOT DETECTED Final    Comment: Performed at [facility], [address], [place], [place] [address]  Culture, blood (routine x 2)     Status: Abnormal (Preliminary result)   Collection Time:  08/14/24 11:09 AM   Specimen: BLOOD LEFT HAND  Result Value Ref Range Status   Specimen Description BLOOD LEFT HAND  Final   Special Requests   Final    BOTTLES DRAWN AEROBIC AND ANAEROBIC Blood Culture results may not be optimal due to an inadequate volume of blood received in culture bottles   Culture  Setup Time   Final    GRAM POSITIVE COCCI IN BOTH AEROBIC AND ANAEROBIC BOTTLES CRITICAL RESULT CALLED TO, READ BACK BY AND VERIFIED WITH:  MIRANDA VBYRK PHARM D 08/15/2024 BY DD @ 9487    Culture (A)  Final    STAPHYLOCOCCUS AUREUS SUSCEPTIBILITIES TO FOLLOW Performed at [facility], [address], [place], [place] [address]    Report Status PENDING  Incomplete  Resp panel by RT-PCR (RSV, Flu A&B, Covid)     Status: None   Collection Time: 08/14/24 11:09 AM   Specimen: Nasal Swab  Result Value Ref Range Status   SARS Coronavirus 2 by RT PCR NEGATIVE NEGATIVE Final   Influenza A by PCR NEGATIVE NEGATIVE Final   Influenza B by PCR NEGATIVE NEGATIVE Final    Comment: (NOTE) The Xpert Xpress SARS-CoV-2/FLU/RSV plus assay is intended as an aid in the diagnosis of influenza from Nasopharyngeal swab specimens and should not be used as a sole basis for treatment. Nasal washings and aspirates are unacceptable for Xpert Xpress SARS-CoV-2/FLU/RSV testing.  Fact Sheet for Patients: bloggercourse.com  Fact Sheet for Healthcare Providers: seriousbroker.it  This test is not yet approved or cleared by the [place]  FDA and has been authorized for detection and/or diagnosis of SARS-CoV-2 by FDA under an Emergency Use Authorization (EUA). This EUA will remain in effect (meaning this test can be used) for the duration of the COVID-19 declaration under Section 564(b)(1) of the Act, 21 U.S.C. section 360bbb-3(b)(1), unless the authorization is terminated or revoked.     Resp Syncytial Virus by PCR NEGATIVE NEGATIVE Final     Comment: (NOTE) Fact Sheet for Patients: bloggercourse.com  Fact Sheet for Healthcare Providers: seriousbroker.it  This test is not yet approved or cleared by the [place]  FDA and has been authorized for detection and/or diagnosis of SARS-CoV-2 by FDA under an Emergency Use Authorization (EUA). This EUA will remain in effect (meaning this test can be used) for the duration of the COVID-19 declaration under Section 564(b)(1) of the Act, 21 U.S.C. section 360bbb-3(b)(1), unless the authorization is terminated or revoked.  Performed at [facility], [address] [place], [place] [address]   Blood Culture ID Panel (Reflexed)     Status: Abnormal   Collection Time: 08/14/24 11:09 AM  Result Value Ref Range Status   Enterococcus faecalis NOT DETECTED NOT DETECTED Final   Enterococcus Faecium NOT DETECTED NOT DETECTED Final   Listeria monocytogenes NOT DETECTED NOT DETECTED Final   Staphylococcus species DETECTED (A) NOT DETECTED Final    Comment: CRITICAL RESULT CALLED TO, READ BACK BY AND VERIFIED WITH:  MIRANDA VBYRK PHARM D 08/15/2024 BY DD @ 9487    Staphylococcus aureus (BCID) DETECTED (A) NOT DETECTED Final    Comment: Methicillin (oxacillin)-resistant Staphylococcus aureus (MRSA). MRSA is predictably resistant to beta-lactam antibiotics (except ceftaroline). Preferred therapy is vancomycin  unless clinically contraindicated. Patient requires contact precautions if  hospitalized. CRITICAL RESULT CALLED TO, READ BACK BY AND VERIFIED WITH:  MIRANDA VBYRK PHARM D 08/15/2024 BY DD @ 0512    Staphylococcus epidermidis NOT DETECTED NOT DETECTED Final   Staphylococcus lugdunensis NOT DETECTED NOT DETECTED Final   Streptococcus species NOT DETECTED  NOT DETECTED Final   Streptococcus agalactiae NOT DETECTED NOT DETECTED Final   Streptococcus pneumoniae NOT DETECTED NOT DETECTED Final   Streptococcus pyogenes NOT DETECTED NOT  DETECTED Final   A.calcoaceticus-baumannii NOT DETECTED NOT DETECTED Final   Bacteroides fragilis NOT DETECTED NOT DETECTED Final   Enterobacterales NOT DETECTED NOT DETECTED Final   Enterobacter cloacae complex NOT DETECTED NOT DETECTED Final   Escherichia coli NOT DETECTED NOT DETECTED Final   Klebsiella aerogenes NOT DETECTED NOT DETECTED Final   Klebsiella oxytoca NOT DETECTED NOT DETECTED Final   Klebsiella pneumoniae NOT DETECTED NOT DETECTED Final   Proteus species NOT DETECTED NOT DETECTED Final   Salmonella species NOT DETECTED NOT DETECTED Final   Serratia marcescens NOT DETECTED NOT DETECTED Final   Haemophilus influenzae NOT DETECTED NOT DETECTED Final   Neisseria meningitidis NOT DETECTED NOT DETECTED Final   Pseudomonas aeruginosa NOT DETECTED NOT DETECTED Final   Stenotrophomonas maltophilia NOT DETECTED NOT DETECTED Final   Candida albicans NOT DETECTED NOT DETECTED Final   Candida auris NOT DETECTED NOT DETECTED Final   Candida glabrata NOT DETECTED NOT DETECTED Final   Candida krusei NOT DETECTED NOT DETECTED Final   Candida parapsilosis NOT DETECTED NOT DETECTED Final   Candida tropicalis NOT DETECTED NOT DETECTED Final   Cryptococcus neoformans/gattii NOT DETECTED NOT DETECTED Final   Meth resistant mecA/C and MREJ DETECTED (A) NOT DETECTED Final    Comment: CRITICAL RESULT CALLED TO, READ BACK BY AND VERIFIED WITH:  MIRANDA VBYRK PHARM D 08/15/2024 BY DD @ [phone] Performed at [facility], [address] [place], [place] [address]   Culture, blood (routine x 2)     Status: Abnormal (Preliminary result)   Collection Time: 08/14/24 11:14 AM   Specimen: BLOOD RIGHT WRIST  Result Value Ref Range Status   Specimen Description BLOOD RIGHT WRIST  Final   Special Requests   Final    BOTTLES DRAWN AEROBIC AND ANAEROBIC Blood Culture results may not be optimal due to an inadequate volume of blood received in culture bottles   Culture  Setup Time   Final    GRAM  POSITIVE COCCI IN BOTH AEROBIC AND ANAEROBIC BOTTLES CRITICAL VALUE NOTED.  VALUE IS CONSISTENT WITH PREVIOUSLY REPORTED AND CALLED VALUE. Performed at [facility], [address] [place], [place] [address]    Culture STAPHYLOCOCCUS AUREUS (A)  Final   Report Status PENDING  Incomplete    Studies/Results: CT SHOULDER RIGHT W CONTRAST Result Date: 08/16/2024 EXAM: CT RIGHT SHOULDER, WITH IV CONTRAST 08/16/2024 06:11:55 AM TECHNIQUE: Axial images were acquired through the right shoulder with IV contrast. Reformatted images were reviewed. 75 mL of iohexol  (OMNIPAQUE ) 350 MG/ML injection was administered intravenously. Automated exposure control, iterative reconstruction, and/or weight based adjustment of the mA/kV was utilized to reduce the radiation dose to as low as reasonably achievable. COMPARISON: CT with IV contrast from 08/11/2024 and 05/24/2024. CLINICAL HISTORY: Shoulder pain. FINDINGS: BONES: There is no evidence of fractures or focal suspicious bone lesions. Subcortical cystic changes extend along the greater tuberosity likely degenerative etiology. JOINTS: No dislocation. The acromioclavicular and glenohumeral joints are intact. There is no substantial joint effusion. There is slight periarticular spurring of the acromioclavicular and glenohumeral joints, mild narrowing of the glenohumeral joint. There is no erosive arthropathy. SOFT TISSUES: The rotator cuff muscles are normal in volume. The tendons are not well evaluated with CT, but appear to be grossly intact. There are multiple enhancing venous collateral vessels in the supraclavicular  fossa and the deltoid and pectoralis major muscles. LUNGS AND PLEURAL SPACES: Pleuroparenchymal scarring opacities and ground glass opacity in the right upper lobe are unchanged. There is increased small layering right pleural effusion noted since 08/11/2024. IMPRESSION: 1. No acute osseous abnormality. Mild degenerative features. 2. Multiple enhancing  venous collateral vessels in the supraclavicular fossa and the deltoid and pectoralis major muscles. 3. Increased small layering right pleural effusion since 08/11/2024. Electronically signed by: Francis Quam MD 08/16/2024 06:30 AM EST RP Workstation: [id number]   ECHOCARDIOGRAM COMPLETE Result Date: 08/15/2024    ECHOCARDIOGRAM REPORT   Patient Name:   Cory Palmer Date of Exam: 08/15/2024 Medical Rec #:  [id number]     Height:       68.0 in Accession #:    [id number]    Weight:       242.7 lb Date of Birth:  21-Dec-1960    BSA:          2.219 m Patient Age:    63 years      BP:           123/92 mmHg Patient Gender: M             HR:           75 bpm. Exam Location:  Inpatient Procedure: 2D Echo, Cardiac Doppler and Color Doppler (Both Spectral and Color            Flow Doppler were utilized during procedure). Indications:    Bacteremia  History:        Patient has prior history of Echocardiogram examinations, most                 recent 03/26/2024. Stroke, Arrythmias:Atrial Fibrillation and                 Atrial Flutter, Signs/Symptoms:Bacteremia; Risk                 Factors:Diabetes, Hypertension and Current Smoker.  Sonographer:    Juliene Rucks Referring Phys: [id number] MARSA KATHEE MELVIN  Sonographer Comments: Patient is obese. IMPRESSIONS  1. Left ventricular ejection fraction, by estimation, is 60 to 65%. The left ventricle has normal function. The left ventricle has no regional wall motion abnormalities. The left ventricular internal cavity size was mildly dilated. There is mild left ventricular hypertrophy. Left ventricular diastolic parameters are indeterminate.  2. Right ventricular systolic function is normal. The right ventricular size is normal.  3. The mitral valve is normal in structure. No evidence of mitral valve regurgitation. No evidence of mitral stenosis.  4. The aortic valve is tricuspid. Aortic valve regurgitation is not visualized. Aortic valve sclerosis/calcification is present, without  any evidence of aortic stenosis.  5. The inferior vena cava is normal in size with greater than 50% respiratory variability, suggesting right atrial pressure of 3 mmHg. FINDINGS  Left Ventricle: Left ventricular ejection fraction, by estimation, is 60 to 65%. The left ventricle has normal function. The left ventricle has no regional wall motion abnormalities. The left ventricular internal cavity size was mildly dilated. There is  mild left ventricular hypertrophy. Left ventricular diastolic parameters are indeterminate. Right Ventricle: The right ventricular size is normal. Right ventricular systolic function is normal. Left Atrium: Left atrial size was normal in size. Right Atrium: Right atrial size was normal in size. Pericardium: There is no evidence of pericardial effusion. Mitral Valve: The mitral valve is normal in structure. Mild mitral annular calcification. No evidence of mitral valve regurgitation. No evidence  of mitral valve stenosis. Tricuspid Valve: The tricuspid valve is normal in structure. Tricuspid valve regurgitation is trivial. No evidence of tricuspid stenosis. Aortic Valve: The aortic valve is tricuspid. Aortic valve regurgitation is not visualized. Aortic valve sclerosis/calcification is present, without any evidence of aortic stenosis. Pulmonic Valve: The pulmonic valve was not well visualized. Pulmonic valve regurgitation is not visualized. No evidence of pulmonic stenosis. Aorta: The aortic root is normal in size and structure. Venous: The inferior vena cava is normal in size with greater than 50% respiratory variability, suggesting right atrial pressure of 3 mmHg. IAS/Shunts: The interatrial septum was not well visualized.  LEFT VENTRICLE PLAX 2D LVIDd:         5.60 cm   Diastology LVIDs:         4.30 cm   LV e' medial:    11.20 cm/s LV PW:         1.40 cm   LV E/e' medial:  12.0 LV IVS:        1.20 cm   LV e' lateral:   12.50 cm/s LVOT diam:     2.20 cm   LV E/e' lateral: 10.7 LV SV:          78 LV SV Index:   35 LVOT Area:     3.80 cm  RIGHT VENTRICLE             IVC RV Basal diam:  4.40 cm     IVC diam: 1.70 cm RV Mid diam:    3.80 cm RV S prime:     16.40 cm/s TAPSE (M-mode): 2.3 cm LEFT ATRIUM           Index LA diam:      5.20 cm 2.34 cm/m LA Vol (A2C): 85.3 ml 38.44 ml/m LA Vol (A4C): 81.7 ml 36.82 ml/m  AORTIC VALVE LVOT Vmax:   127.00 cm/s LVOT Vmean:  86.500 cm/s LVOT VTI:    0.206 m  AORTA Ao Root diam: 3.50 cm MITRAL VALVE                TRICUSPID VALVE MV Area (PHT): 3.72 cm     TR Peak grad:   25.4 mmHg MV Decel Time: 204 msec     TR Vmax:        252.00 cm/s MV E velocity: 134.00 cm/s MV A velocity: 33.50 cm/s   SHUNTS MV E/A ratio:  4.00         Systemic VTI:  0.21 m                             Systemic Diam: 2.20 cm Redell Shallow MD Electronically signed by Redell Shallow MD Signature Date/Time: 08/15/2024/2:40:02 PM    Final       Assessment/Plan:  INTERVAL HISTORY: Patient did agree to admission and to stay over the weekend into next week it appears   Principal Problem:   MRSA bacteremia Active Problems:   Diabetes mellitus type II, non insulin  dependent (HCC)   Depressed bipolar disorder (HCC)   Hypertension   PAF (paroxysmal atrial fibrillation) (HCC)   BPH (benign prostatic hyperplasia)   Below-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)   Cirrhosis of liver (HCC)   Anemia   Diabetic polyneuropathy associated with type 2 diabetes mellitus (HCC)   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HCC)   GERD (gastroesophageal reflux disease)   PVD (peripheral vascular disease)   Alcohol abuse  Cocaine abuse with cocaine-induced mood disorder (HCC)   History of pulmonary embolus (PE)   GAD (generalized anxiety disorder)   History of traumatic subdural hematoma   Bacteremia    Cory Palmer is a 64 y.o. male with abuse neuropathy history of bilateral below the knee amputations cirrhosis peripheral vascular disease homelessness polysubstance abuse  who came back to the ER after having found to MRSA bacteremia and having left AGAINST MEDICAL ADVICE due to our concerns that he was going to leave again yesterday which she was telling us  he was going to do we dosed him with oritavancin in ER   #1 MRSA bacteremia;   Concern for potential endocarditis  The echocardiogram is without vegetations.  I will repeat blood cultures  Oritavancin will be therapeutic in his system for minimum of a week if not longer  We will likely follow this with oral antibiotics unless he agrees to inpatient or SNF for IV abx  #2 Polysubstance abuse: concern this may drive him to leave again but hopefully he will not leave AMA again  #3 Cirrhosis: secondary to etoh  #4 IP: Contact precautions    I personally spent a total of 50 minutes in the care of the patient today including preparing to see the patient, getting/reviewing separately obtained history, performing a medically appropriate exam/evaluation, placing orders, referring and communicating with other health care professionals, documenting clinical information in the EHR, independently interpreting results, communicating results, coordinating care, and calling Trish to arrange for TEE next week if patient is still here.   Evaluation of the patient requires complex antimicrobial therapy evaluation, counseling , isolation needs to reduce disease transmission and risk assessment and mitigation.   I am available for questions this weekend and will definitely check in on him on Monday.   LOS: 2 days   Jomarie Fleeta Rothman 08/16/2024, 5:25 PM  "

## 2024-08-16 NOTE — Discharge Instructions (Signed)
 Warming Shelter opens when temperatures are freezing: - When open, [facility] [address] on [address] will provide overnight shelter from 7 p.m. until 7 a.m.  [employer] assistance programs Crisis assistance programs  -[facility]eless[facility] Arts Development Officer. If you are experiencing homelessness in [place], [place] , your first point of contact should be Pensions Consultant. You can reach Coordinated Entry by calling ([phone]) [phone] or by emailing [email or web address] [email or web address].  Community access points: [employer] [id number], [place]) every Tuesday from 9am-10am. [facility] ([address], [place]) every Wednesday from 8am-9am.   -[place] Coordinated Re-entry Daniel [place]: Dial 211 and request. Offers referrals to homeless shelters in the area.    -The [employer] (502)828-5036) offers several services to local families, as funding allows. The Emergency Assistance Program (EAP), which they administer, provides household goods, free food, clothing, and financial aid to people in need in the [place]  area. The EAP program does have some qualification, and counselors will interview clients for financial assistance by written referral only. Referrals need to be made by the Department of Social Services or by other EAP approved human services agencies or charities in the area.  -[facility] of [employer], which can be reached at ([phone], offers emergency assistance programs for those in need of help, such as food, rent assistance, a soup kitchen, shelter, and clothing. They are based in [facility] [place]  but provide a number of services to those that qualify for assistance.   [facility] Department of Social Services may be able to offer temporary financial assistance and cash grants for paying rent and utilities, Help may be provided for local county residents who may be  experiencing personal crisis when other resources, including government programs, are not available. Call [phone]  -High [employer] is a [employer] agency, The organization can offer emergency assistance for paying rent, caremark rx, utilities, food, household products and furniture. They offer extensive emergency and transitional housing for families, children and single women, and also run a Boy's and [employer]. Thrift Shops, Secondary School Teacher, and other aid offered too. [address] [place], [place]  [address], ([phone]  -[place] Low Income Energy Assistance Program -- This is offered for [facility] families. The federal government created [employer] Program provides a one-time cash grant payment to help eligible low-income families pay their electric and heating bills. [address] [place], [place]  [address], ([phone]  -[place] Emergency Assistance -- A program offers emergency utility and rent funds for greater [place] area residents. The program can also provide counseling and referrals to charities and government programs. Also provides food and a free meal program that serves lunch Mondays - Saturdays and dinner seven days per week to individuals in the community. [address], [place], [place]  [address], [phone]  -[employer] - Offers affordable apartment and housing communities across      [place] and [place]. The low income and seniors can access public housing, rental assistance to qualified applicants, and apply for the section 8 rent subsidy program. Other programs include Chiropractor and Engineer, Maintenance. [address], [place], [place]  [address], dial [phone].  -The [facility] provides transitional housing to veterans and the disabled. Clients will also access other services too,  including assistance in applying for Disability, life skills classes, case management, and assistance in  finding permanent housing. [address] [place], [place]  [address], call [phone]  -[facility] Transitional Housing through [employer] is for people who were just evicted or that are formerly homeless. The non-profit will also help then gain self-sufficiency, find a home or apartment to live in, and also provides information on rent assistance when needed. Phone [phone]  -The [facility] helps low income, elderly, or disabled residents in seven counties in the [place] ([place], [place], [place], [place], [place], [place], [place], and [place]) save energy and reduce their utility bills by improving energy efficiency. Phone [phone].  -[employer] is located in the [facility] in the [employer], [address], [place], [place] [address]. Parking is in the rear of the building. Phone: [phone]   General Email: info@gsohc .org  [facility] provides free housing counseling assistance in locating affordable rental housing or housing with support services for families and individuals in crisis and the chronically homeless. We provide potential resources for other housing needs like utilities. Our trained counselors also work with clients on budgeting and financial literacy in effort to empower them to take control of their financial situations. [employer] collaborates with homeless service providers and other stakeholders as part of the [employer] COC (Continuum of Care). The (COC) is a regional/local planning body that coordinates housing and services funding for homeless families and individuals. The role of [facility] in the COC is through housing counseling to work with people we serve on diversion strategies for those that  are at imminent risk of becoming homeless. We also work with the Coordinated Assessment/Entry Specialist who attempts to find temporary solutions and/or connects the people to Housing First, Rapid Re-housing or transitional housing programs. Our Homelessness Prevention Housing Counselors meet with clients on business days (Monday-Fridays, except scheduled holidays) from 8:30 am to 4:30 pm.  Legal assistance for evictions, foreclosure, and more -If you need free legal advice on civil issues, such as foreclosures, evictions, electronics engineer, government programs, domestic issues and more, Landscape Architect of [place]) is a associate professor firm that provides free legal services and counsel to lower income people, seniors, disabled, and others, The goal is to ensure everyone has access to justice and fair representation. Call them at ([phone].  [facility] for Housing and Community Studies can provide info about obtaining legal assistance with evictions. Phone [phone].  Data Processing Manager  The [employer], [employer]. offers job and dispensing optician. Resources are focused on helping students obtain the skills and experiences that are necessary to compete in today's challenging and tight job market. The non-profit faith-based community action agency offers internship trainings as well as classroom instruction. Classes are tailored to meet the needs of people in the [facility] region. [place], [place] [address], [phone]  Foreclosure prevention/Debt Services Family Services of the [employer] Credit Counseling Service inludes debt and foreclosure prevention programs for local families. This includes money management, financial advice, budget review and development of a written action plan with a pensions consultant to help solve specific individual financial problems. In addition, housing and mortgage counselors can also  provide pre- and post-purchase homeownership counseling, default resolution counseling (to prevent foreclosure) and reverse mortgage counseling. A Debt Management Program allows people and families with a high level of credit card or medical debt to consolidate and repay consumer debt and loans to creditors and rebuild positive credit ratings and scores.  Contact (336) F1555895.  Community clinics in [place] -Health Department [facility] Clinic: [address] [place], [place], [address]. [phone].  -Health Department [place] [facility]: [address], [facility], [address]. ([phone].  -[facility] offers medical care through a group of doctors, pharmacies and other healthcare related agencies that offer services for low income, uninsured adults in [place]. Also offers adult Dental care and assistance with applying for an [employer]. Call [phone].   [facility]. This center provides low-cost health care to those without health insurance. Services offered include an onsite pharmacy. Phone [phone]. [address], [address], [place].  -Medication Assistance Program serves as a link between pharmaceutical companies and patients to provide low cost or free prescription medications. This service is available for residents who meet certain income restrictions and have no insurance coverage. PLEASE CALL [phone] [place]) OR [phone] ([place])  -One Step Further: Materials Engineer, The [employer] Support & Nutrition Program, [place]. Call [phone]/ [phone].  Food pantry and assistance -Urban Ministry-Food Bank: [address], [place] [address]. Phone [phone]  -[facility]: [address] [place], [place] [address]. ([phone].  -Missionary Ministry: has the purpose of visiting the sick and shut-ins and provide for needs  in the surrounding communities. Call (908) [phone]. Email: [email or web address] .com This program provides: Food box for seniors, Financial assistance, Food to meet basic nutritional needs.  -Meals on Wheels with Senior Resources: [facility] residents age 82 and over who are homebound and unable to obtain and prepare a nutritious meal for themselves are eligible for this service. There may be a waiting list in certain parts of [facility] if the route in that area is full. If you are in [facility] and [place] call [phone] to register. For all other areas call [phone] to register.  -Greater Dietitian: https://findfood.bargaincontractor.si  TRANSPORTATION: -[employer] Department of Health: Call [facility] and [employer] at [phone] for details. attractionguides.es  -Access [place]: Access [place] is the [employer]'s shared-ride transportation service for eligible riders who have a disability that prevents them from riding the fixed route bus. Call ([phone]. Access [place] riders must pay a fare of $1.50 per trip, or may purchase a 10-ride punch card for $14.00 ($1.40 per ride) or a 40-ride punch card for $48.00 ($1.20 per ride).  -The Directv transportation service is provided for senior citizens (60+) who live independently within [place] city limits and are unable to drive or have limited access to transportation. Call [phone] to schedule an appointment.  -[place] Transportation: For Medicare or Medicaid recipients call 7167517678?[facility] Ambulance, wheelchair fleeta, and ambulatory quotes available.   FLEEING VIOLENCE: -Family Services of the [facility]- 24/7 Crisis line 2141322712) -[facility] Justice Centers: (336) 641-SAFE (712)[phone])  [place] 2-1-1 is another useful way to locate resources in the community. Visit  shedsizes.ch to find service information online. If you need additional assistance, 2-1-1 Referral Specialists are available 24 hours a day, every day by dialing 2-1-1 or [phone] from any phone. The call is free, confidential, and available in any language.  Affordable Housing Search http://www.nchousingsearch.[facility])   M-F 8a-3p [address]  [place], [place] [address] [phone] Services include: laundry, barbering, support groups, case management, phone & computer access, showers, AA/NA mtgs, mental health/substance abuse nurse, job skills class, disability information, VA assistance, spiritual  classes, etc. Winter Shelter available when temperatures are less than 32 degrees.   HOMELESS SHELTERS [facility] at [facility]- Call [phone] ext. 347 or ext. 336. Located at [address] [place], [place] [address]  [facility] Mens Shelter- Call ([phone]. Located at [address] [place] [address].  [facility]- [employer]. Call [phone]. Office located at [address], [place] [address].  [facility] Family Housing through [place] [phone].  [facility]- Call [phone]. Located at [address] [place], [place], [place] [address].  [facility]-For Pregnant mothers. Call [phone]. Located at [address] [place], [address].  [facility]-For men in [place]. Call [phone]. [facility]ter in [place]. Call [phone].  Home of [employer] for [employer] [phone]. Office located at [address] [place], [address].  [employer] be agreeable to help with chores. Call [phone] ext. [phone].  Men's: [address] [place], [place] [address]. Women's: [facility]  [address] [place], [place] [address]  Crisis Services Therapeutic  Alternatives Mobile Crisis Management- [phone]  [facility] [address] [place], [place] [address]. Phone: [phone]

## 2024-08-17 DIAGNOSIS — R7881 Bacteremia: Secondary | ICD-10-CM | POA: Diagnosis not present

## 2024-08-17 DIAGNOSIS — E1142 Type 2 diabetes mellitus with diabetic polyneuropathy: Secondary | ICD-10-CM

## 2024-08-17 DIAGNOSIS — I739 Peripheral vascular disease, unspecified: Secondary | ICD-10-CM | POA: Diagnosis not present

## 2024-08-17 DIAGNOSIS — S88112D Complete traumatic amputation at level between knee and ankle, left lower leg, subsequent encounter: Secondary | ICD-10-CM | POA: Diagnosis not present

## 2024-08-17 DIAGNOSIS — F1010 Alcohol abuse, uncomplicated: Secondary | ICD-10-CM | POA: Diagnosis not present

## 2024-08-17 DIAGNOSIS — Z87828 Personal history of other (healed) physical injury and trauma: Secondary | ICD-10-CM

## 2024-08-17 LAB — CULTURE, BLOOD (ROUTINE X 2)

## 2024-08-17 LAB — CBC
HCT: 30.6 % — ABNORMAL LOW (ref 39.0–52.0)
Hemoglobin: 11.1 g/dL — ABNORMAL LOW (ref 13.0–17.0)
MCH: 33.6 pg (ref 26.0–34.0)
MCHC: 36.3 g/dL — ABNORMAL HIGH (ref 30.0–36.0)
MCV: 92.7 fL (ref 80.0–100.0)
Platelets: 67 K/uL — ABNORMAL LOW (ref 150–400)
RBC: 3.3 MIL/uL — ABNORMAL LOW (ref 4.22–5.81)
RDW: 13.1 % (ref 11.5–15.5)
WBC: 5 K/uL (ref 4.0–10.5)
nRBC: 0 % (ref 0.0–0.2)

## 2024-08-17 LAB — BASIC METABOLIC PANEL WITH GFR
Anion gap: 10 (ref 5–15)
BUN: 7 mg/dL — ABNORMAL LOW (ref 8–23)
CO2: 22 mmol/L (ref 22–32)
Calcium: 8.2 mg/dL — ABNORMAL LOW (ref 8.9–10.3)
Chloride: 101 mmol/L (ref 98–111)
Creatinine, Ser: 0.52 mg/dL — ABNORMAL LOW (ref 0.61–1.24)
GFR, Estimated: >60 mL/min
Glucose, Bld: 158 mg/dL — ABNORMAL HIGH (ref 70–99)
Potassium: 3.5 mmol/L (ref 3.5–5.1)
Sodium: 133 mmol/L — ABNORMAL LOW (ref 135–145)

## 2024-08-17 LAB — GLUCOSE, CAPILLARY
Glucose-Capillary: 156 mg/dL — ABNORMAL HIGH (ref 70–99)
Glucose-Capillary: 167 mg/dL — ABNORMAL HIGH (ref 70–99)
Glucose-Capillary: 196 mg/dL — ABNORMAL HIGH (ref 70–99)
Glucose-Capillary: 155 mg/dL — ABNORMAL HIGH (ref 70–99)

## 2024-08-17 NOTE — Plan of Care (Signed)
" °  Problem: Health Behavior/Discharge Planning: Goal: Ability to manage health-related needs will improve Outcome: Progressing   Problem: Skin Integrity: Goal: Risk for impaired skin integrity will decrease Outcome: Progressing   Problem: Clinical Measurements: Goal: Ability to maintain clinical measurements within normal limits will improve Outcome: Progressing Goal: Will remain free from infection Outcome: Progressing Goal: Diagnostic test results will improve Outcome: Progressing   Problem: Activity: Goal: Risk for activity intolerance will decrease Outcome: Progressing   Problem: Elimination: Goal: Will not experience complications related to urinary retention Outcome: Progressing   Problem: Pain Managment: Goal: General experience of comfort will improve and/or be controlled Outcome: Progressing   "

## 2024-08-17 NOTE — Progress Notes (Signed)
 "                        PROGRESS NOTE        PATIENT DETAILS Name: Cory Palmer Age: 64 y.o. Sex: male Date of Birth: 14-Mar-1961 Admit Date: 08/14/2024 Admitting Physician Marsa KATHEE Scurry, MD ERE:Amntw, Rojelio Caldron, NP  Brief Summary: Patient is a 64 y.o.  male with history of B/L BKA-homelessness-cocaine/EtOH use-urine A-fib/PE on Eliquis -was recently hospitalized from 1/11-1/12-4 sepsis-blood cultures were positive for MRSA but patient unfortunately signed out AMA-presented to the hospital on 1/14 with subjective fever/fatigue/not feeling well-and was subsequently admitted to the hospitalist service.  Significant events: 1/11-1/12>> admitted for sepsis-blood cultures positive for MRSA-patient signed out AMA 1/14>> admit to [facility]  Significant studies: 1/11>> CT head: No acute intracranial process 1/11>> CT chest/abdomen/pelvis: No acute abnormality in the chest/abdomen/pelvis 1/15>>  TTE: EF 60-65%, no obvious vegetation seen. 1/16>> CT right shoulder: No acute osseous abnormality.  Significant microbiology data: 1/14>> COVID/influenza/RSV PCR: Negative 1/11>> blood culture: MRSA 1/14>> blood culture: MRSA 1/16>> blood culture: No growth  Procedures: None  Consults: ID  Subjective: No major issues overnight-no complaints-apart from pain in the right shoulder area.  Objective: Vitals: Blood pressure 128/71, pulse 73, temperature 98.4 F (36.9 C), temperature source Oral, resp. rate 19, height 6' (1.829 m), weight 110.4 kg, SpO2 91%.   Exam: Gen Exam:Alert awake-not in any distress HEENT:atraumatic, normocephalic Chest: B/L clear to auscultation anteriorly CVS:S1S2 regular Abdomen:soft non tender, non distended Extremities: B/L BKA Neurology: Non focal Skin: no rash  Pertinent Labs/Radiology:    Latest Ref Rng & Units 08/17/2024    6:48 AM 08/15/2024    1:44 AM 08/14/2024   12:10 PM  CBC  WBC 4.0 - 10.5 K/uL 5.0  2.6  3.5   Hemoglobin 13.0 - 17.0 g/dL  88.8  89.8  88.4   Hematocrit 39.0 - 52.0 % 30.6  29.4  32.7   Platelets 150 - 400 K/uL 67  42  53     Lab Results  Component Value Date   NA 133 (L) 08/17/2024   K 3.5 08/17/2024   CL 101 08/17/2024   CO2 22 08/17/2024      Assessment/Plan: MRSA bacteremia (prior history of MRSA bacteremia August 2025, March 2021 and March 2020) S/p 1 dose of oritavancin on 1/15-as he was starting to leave AMA He now has decided to stay in the hospital until his workup is complete TEE tentatively scheduled for next week if he still is in the hospital Repeat blood cultures on 1/16 negative so far.   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) Compensated  History of pulmonary embolism-2024 Noncompliant to Eliquis  Furthermore-has chronic thrombocytopenia and recent GI bleed in December 2025  PAF Telemetry monitoring Non compliant to Eliquis  See above  DM-2 (A1c 5.7on  03/24/2024) SSI  Recent Labs    08/16/24 1555 08/16/24 2054 08/17/24 0805  GLUCAP 159* 197* 156*     Bipolar disorder/depression/anxiety Trazodone  Supportive care  EtOH use No withdrawal symptoms currently Ativan  per [facility] protocol  Cocaine use Claims he uses cocaine several times a month-very vague about the route he uses As needed Ativan  for withdrawal symptoms  Homelessness TOC eval  Noncompliance Left AMA recently Claims that he does  not have any major medical problems and does not take any prescription medications-however has above-noted history and is supposed to be on above-noted medications. Has been counseled  Class 1 Obesity Estimated body mass index is 33.01 kg/m as calculated from the  following:   Height as of this encounter: 6' (1.829 m).   Weight as of this encounter: 110.4 kg.   Code status:   Code Status: Full Code   DVT Prophylaxis: SCD's    Family Communication: None at bedside   Disposition Plan: Status is: Inpatient Remains inpatient appropriate because: Severity of illness   Planned Discharge Destination:Home   Diet: Diet Order             Diet regular Room service appropriate? Yes with Assist; Fluid consistency: Thin  Diet effective now                     Antimicrobial agents: Anti-infectives (From admission, onward)    Start     Dose/Rate Route Frequency Ordered Stop   08/15/24 1130  dalbavancin (DALVANCE ) 1,500 mg in dextrose  5 % 500 mL IVPB        1,500 mg 967.7 mL/hr over 31 Minutes Intravenous Once 08/15/24 1123 08/15/24 1253   08/14/24 1630  DAPTOmycin  (CUBICIN ) IVPB 700 mg/133mL premix  Status:  Discontinued        8 mg/kg  85.1 kg (Adjusted) 200 mL/hr over 30 Minutes Intravenous Daily 08/14/24 1620 08/15/24 1123        MEDICATIONS: Scheduled Meds:  insulin  aspart  0-15 Units Subcutaneous TID WC   pantoprazole   40 mg Oral BID AC   sodium chloride  flush  3 mL Intravenous Q12H   Continuous Infusions: PRN Meds:.acetaminophen  **OR** acetaminophen , polyethylene glycol, traZODone    I have personally reviewed following labs and imaging studies  LABORATORY DATA: CBC: Recent Labs  Lab 08/11/24 1702 08/12/24 0530 08/14/24 1210 08/15/24 0144 08/17/24 0648  WBC 6.6 4.0 3.5* 2.6* 5.0  NEUTROABS 5.7  --  3.0  --   --   HGB 12.9* 10.7* 11.5* 10.1* 11.1*  HCT 36.6* 32.1* 32.7* 29.4* 30.6*  MCV 96.3 99.1 94.8 97.0 92.7  PLT 77* 54* 53* 42* 67*    Basic Metabolic Panel: Recent Labs  Lab 08/11/24 1702 08/11/24 2320 08/12/24 0530 08/14/24 1430 08/15/24 0144 08/17/24 0648  NA 132*  --  133* 133* 129* 133*  K 3.6  --  3.2* 3.3* 3.4* 3.5  CL 98  --  103 100 98 101  CO2 21*  --  18* 20* 20* 22  GLUCOSE 175*   --  119* 159* 214* 158*  BUN 11  --  12 11 13  7*  CREATININE 0.82  --  0.79 0.69 0.76 0.52*  CALCIUM  8.9  --  7.6* 8.2* 7.9* 8.2*  MG  --  1.1* 1.7 1.5*  --   --     GFR: Estimated Creatinine Clearance: 119.7 mL/min (A) (by C-G formula based on SCr of 0.52 mg/dL (L)).  Liver Function Tests: Recent Labs  Lab 08/11/24 1702 08/14/24 1430 08/15/24 0144  AST 36 43* 32  ALT 14 19 16   ALKPHOS 95 91 88  BILITOT 2.3* 1.9* 1.8*  PROT 7.4 6.3* 5.5*  ALBUMIN 3.7 3.2* 2.9*   No results for input(s): LIPASE, AMYLASE in the last 168 hours. Recent Labs  Lab 08/11/24 1702  AMMONIA 53*    Coagulation Profile: Recent Labs  Lab 08/11/24 1702 08/15/24 0144  INR 1.5* 1.7*  Cardiac Enzymes: No results for input(s): CKTOTAL, CKMB, CKMBINDEX, TROPONINI in the last 168 hours.  BNP (last 3 results) No results for input(s): PROBNP in the last 8760 hours.  Lipid Profile: No results for input(s): CHOL, HDL, LDLCALC, TRIG, CHOLHDL, LDLDIRECT in the last 72 hours.  Thyroid Function Tests: No results for input(s): TSH, T4TOTAL, FREET4, T3FREE, THYROIDAB in the last 72 hours.  Anemia Panel: No results for input(s): VITAMINB12, FOLATE, FERRITIN, TIBC, IRON, RETICCTPCT in the last 72 hours.  Urine analysis:    Component Value Date/Time   COLORURINE AMBER (A) 08/11/2024 1827   APPEARANCEUR CLEAR 08/11/2024 1827   LABSPEC 1.024 08/11/2024 1827   PHURINE 6.0 08/11/2024 1827   GLUCOSEU NEGATIVE 08/11/2024 1827   HGBUR LARGE (A) 08/11/2024 1827   BILIRUBINUR NEGATIVE 08/11/2024 1827   KETONESUR NEGATIVE 08/11/2024 1827   PROTEINUR 30 (A) 08/11/2024 1827   NITRITE NEGATIVE 08/11/2024 1827   LEUKOCYTESUR NEGATIVE 08/11/2024 1827    Sepsis Labs: Lactic Acid, Venous    Component Value Date/Time   LATICACIDVEN 1.4 08/14/2024 1321    MICROBIOLOGY: Recent Results (from the past 240 hours)  Resp panel by RT-PCR (RSV, Flu A&B, Covid)  Anterior Nasal Swab     Status: None   Collection Time: 08/11/24  3:50 PM   Specimen: Anterior Nasal Swab  Result Value Ref Range Status   SARS Coronavirus 2 by RT PCR NEGATIVE NEGATIVE Final    Comment: (NOTE) SARS-CoV-2 target nucleic acids are NOT DETECTED.  The SARS-CoV-2 RNA is generally detectable in upper respiratory specimens during the acute phase of infection. The lowest concentration of SARS-CoV-2 viral copies this assay can detect is 138 copies/mL. A negative result does not preclude SARS-Cov-2 infection and should not be used as the sole basis for treatment or other patient management decisions. A negative result may occur with  improper specimen collection/handling, submission of specimen other than nasopharyngeal swab, presence of viral mutation(s) within the areas targeted by this assay, and inadequate number of viral copies(<138 copies/mL). A negative result must be combined with clinical observations, patient history, and epidemiological information. The expected result is Negative.  Fact Sheet for Patients:  bloggercourse.com  Fact Sheet for Healthcare Providers:  seriousbroker.it  This test is no t yet approved or cleared by the [place]  FDA and  has been authorized for detection and/or diagnosis of SARS-CoV-2 by FDA under an Emergency Use Authorization (EUA). This EUA will remain  in effect (meaning this test can be used) for the duration of the COVID-19 declaration under Section 564(b)(1) of the Act, 21 U.S.C.section 360bbb-3(b)(1), unless the authorization is terminated  or revoked sooner.       Influenza A by PCR NEGATIVE NEGATIVE Final   Influenza B by PCR NEGATIVE NEGATIVE Final    Comment: (NOTE) The Xpert Xpress SARS-CoV-2/FLU/RSV plus assay is intended as an aid in the diagnosis of influenza from Nasopharyngeal swab specimens and should not be used as a sole basis for treatment. Nasal washings  and aspirates are unacceptable for Xpert Xpress SARS-CoV-2/FLU/RSV testing.  Fact Sheet for Patients: bloggercourse.com  Fact Sheet for Healthcare Providers: seriousbroker.it  This test is not yet approved or cleared by the [place]  FDA and has been authorized for detection and/or diagnosis of SARS-CoV-2 by FDA under an Emergency Use Authorization (EUA). This EUA will remain in effect (meaning this test can be used) for the duration of the COVID-19 declaration under Section 564(b)(1) of the Act, 21 U.S.C. section 360bbb-3(b)(1), unless the authorization is terminated  or revoked.     Resp Syncytial Virus by PCR NEGATIVE NEGATIVE Final    Comment: (NOTE) Fact Sheet for Patients: bloggercourse.com  Fact Sheet for Healthcare Providers: seriousbroker.it  This test is not yet approved or cleared by the [place]  FDA and has been authorized for detection and/or diagnosis of SARS-CoV-2 by FDA under an Emergency Use Authorization (EUA). This EUA will remain in effect (meaning this test can be used) for the duration of the COVID-19 declaration under Section 564(b)(1) of the Act, 21 U.S.C. section 360bbb-3(b)(1), unless the authorization is terminated or revoked.  Performed at [facility], [address] [place], [place] [address]   Blood Culture (routine x 2)     Status: Abnormal (Preliminary result)   Collection Time: 08/11/24  5:02 PM   Specimen: BLOOD RIGHT ARM  Result Value Ref Range Status   Specimen Description   Final    BLOOD RIGHT ARM Performed at [facility], [address], [place], [place] [address]    Special Requests   Final    BOTTLES DRAWN AEROBIC AND ANAEROBIC Blood Culture results may not be optimal due to an inadequate volume of blood received in culture bottles Performed at [facility], [address], [place], [place] [address]    Culture  Setup Time   Final    GRAM POSITIVE COCCI IN BOTH AEROBIC AND ANAEROBIC BOTTLES CRITICAL RESULT CALLED TO, READ BACK BY AND VERIFIED WITH: N. GLOGOVAC PHARMD, AT 9081 08/12/24 D. JEARLEAN    Culture (A)  Final    METHICILLIN RESISTANT STAPHYLOCOCCUS AUREUS Sent to [facility] for further susceptibility testing. Performed at [facility], [address] [place], [place] [address]    Report Status PENDING  Incomplete   Organism ID, Bacteria METHICILLIN RESISTANT STAPHYLOCOCCUS AUREUS  Final      Susceptibility   Methicillin resistant staphylococcus aureus - MIC*    CIPROFLOXACIN  >=8 RESISTANT Resistant     ERYTHROMYCIN >=8 RESISTANT Resistant     GENTAMICIN <=0.5 SENSITIVE Sensitive     OXACILLIN >=4 RESISTANT Resistant     TETRACYCLINE >=16 RESISTANT Resistant     VANCOMYCIN  <=0.5 SENSITIVE Sensitive     TRIMETH /SULFA  >=320 RESISTANT Resistant     CLINDAMYCIN  <=0.25 SENSITIVE Sensitive     RIFAMPIN <=0.5 SENSITIVE Sensitive     Inducible Clindamycin  NEGATIVE Sensitive     LINEZOLID 2 SENSITIVE Sensitive     * METHICILLIN RESISTANT STAPHYLOCOCCUS AUREUS  Blood Culture ID Panel (Reflexed)     Status: Abnormal   Collection Time: 08/11/24  5:02 PM  Result Value Ref Range Status   Enterococcus faecalis NOT DETECTED NOT DETECTED Final   Enterococcus Faecium NOT DETECTED NOT DETECTED Final   Listeria monocytogenes NOT DETECTED NOT DETECTED Final   Staphylococcus species DETECTED (A) NOT DETECTED Final    Comment: CRITICAL RESULT CALLED TO, READ BACK BY AND VERIFIED WITH: N. GLOGOVAC PHARMD, AT 9081 08/12/24 D. VANHOOK    Staphylococcus aureus (BCID) DETECTED (A) NOT DETECTED Final    Comment: Methicillin (oxacillin)-resistant Staphylococcus aureus (MRSA). MRSA is predictably resistant to beta-lactam antibiotics (except ceftaroline). Preferred therapy is vancomycin  unless clinically contraindicated. Patient requires contact precautions if   hospitalized. CRITICAL RESULT CALLED TO, READ BACK BY AND VERIFIED WITH: N. GLOGOVAC PHARMD, AT 9081 08/12/24 D. VANHOOK    Staphylococcus epidermidis NOT DETECTED NOT DETECTED Final   Staphylococcus lugdunensis NOT DETECTED NOT DETECTED Final   Streptococcus species NOT DETECTED NOT DETECTED Final  Streptococcus agalactiae NOT DETECTED NOT DETECTED Final   Streptococcus pneumoniae NOT DETECTED NOT DETECTED Final   Streptococcus pyogenes NOT DETECTED NOT DETECTED Final   A.calcoaceticus-baumannii NOT DETECTED NOT DETECTED Final   Bacteroides fragilis NOT DETECTED NOT DETECTED Final   Enterobacterales NOT DETECTED NOT DETECTED Final   Enterobacter cloacae complex NOT DETECTED NOT DETECTED Final   Escherichia coli NOT DETECTED NOT DETECTED Final   Klebsiella aerogenes NOT DETECTED NOT DETECTED Final   Klebsiella oxytoca NOT DETECTED NOT DETECTED Final   Klebsiella pneumoniae NOT DETECTED NOT DETECTED Final   Proteus species NOT DETECTED NOT DETECTED Final   Salmonella species NOT DETECTED NOT DETECTED Final   Serratia marcescens NOT DETECTED NOT DETECTED Final   Haemophilus influenzae NOT DETECTED NOT DETECTED Final   Neisseria meningitidis NOT DETECTED NOT DETECTED Final   Pseudomonas aeruginosa NOT DETECTED NOT DETECTED Final   Stenotrophomonas maltophilia NOT DETECTED NOT DETECTED Final   Candida albicans NOT DETECTED NOT DETECTED Final   Candida auris NOT DETECTED NOT DETECTED Final   Candida glabrata NOT DETECTED NOT DETECTED Final   Candida krusei NOT DETECTED NOT DETECTED Final   Candida parapsilosis NOT DETECTED NOT DETECTED Final   Candida tropicalis NOT DETECTED NOT DETECTED Final   Cryptococcus neoformans/gattii NOT DETECTED NOT DETECTED Final   Meth resistant mecA/C and MREJ DETECTED (A) NOT DETECTED Final    Comment: CRITICAL RESULT CALLED TO, READ BACK BY AND VERIFIED WITH: N. GLOGOVAC PHARMD, AT 9081 08/12/24 CHARM CARMIN Performed at [facility], [address], [place], [place] [address]   MIC (1 Drug)-     Status: None   Collection Time: 08/11/24  5:02 PM  Result Value Ref Range Status   Min Inhibitory Conc (1 Drug) BLOOD  Final   Source [id number] mrsa dapto blood  Corrected    Comment: Performed at [facility], [address] [place], [place] [address] CORRECTED ON 01/15 AT 1315: PREVIOUSLY REPORTED AS BLOOD/ MRSA/ DAPTOMYCIN    MIC (1 Drug)-     Status: Abnormal   Collection Time: 08/11/24  5:02 PM  Result Value Ref Range Status   Min Inhibitory Conc (1 Drug) Preliminary report (A)  Final    Comment: (NOTE) Performed At: [facility] [address] [place], [place] [id number] Jennette Shorter MD Ey:[phone]    Source MRSA/DAPTOMYCIN / BLOOD  Final    Comment: Performed at [facility], [address], [place], [place] [address]  MIC Result     Status: Abnormal   Collection Time: 08/11/24  5:02 PM  Result Value Ref Range Status   Result 1 (MIC) Comment (A)  Final    Comment: (NOTE) Methicillin - resistant Staphylococcus aureus Identification performed by account, not confirmed by this laboratory. DAPTOMYCIN  Performed At: [facility] [address] [place], [place] [id number] Jennette Shorter MD Ey:[id number]   Respiratory (~20 pathogens) panel by PCR     Status: None   Collection Time: 08/12/24  3:30 AM   Specimen: Nasopharyngeal Swab; Respiratory  Result Value Ref Range Status   Adenovirus NOT DETECTED NOT DETECTED Final   Coronavirus 229E NOT DETECTED NOT DETECTED Final    Comment: (NOTE) The Coronavirus on the Respiratory Panel, DOES NOT test for the novel  Coronavirus (2019 nCoV)    Coronavirus HKU1 NOT DETECTED NOT DETECTED Final   Coronavirus NL63 NOT DETECTED NOT DETECTED Final   Coronavirus OC43 NOT DETECTED NOT DETECTED Final   Metapneumovirus NOT DETECTED NOT DETECTED Final  Rhinovirus / Enterovirus NOT DETECTED NOT DETECTED Final   Influenza A NOT DETECTED NOT DETECTED Final    Influenza B NOT DETECTED NOT DETECTED Final   Parainfluenza Virus 1 NOT DETECTED NOT DETECTED Final   Parainfluenza Virus 2 NOT DETECTED NOT DETECTED Final   Parainfluenza Virus 3 NOT DETECTED NOT DETECTED Final   Parainfluenza Virus 4 NOT DETECTED NOT DETECTED Final   Respiratory Syncytial Virus NOT DETECTED NOT DETECTED Final   Bordetella pertussis NOT DETECTED NOT DETECTED Final   Bordetella Parapertussis NOT DETECTED NOT DETECTED Final   Chlamydophila pneumoniae NOT DETECTED NOT DETECTED Final   Mycoplasma pneumoniae NOT DETECTED NOT DETECTED Final    Comment: Performed at [facility], [address] [place], [place] [address]  Culture, blood (routine x 2)     Status: Abnormal (Preliminary result)   Collection Time: 08/14/24 11:09 AM   Specimen: BLOOD LEFT HAND  Result Value Ref Range Status   Specimen Description BLOOD LEFT HAND  Final   Special Requests   Final    BOTTLES DRAWN AEROBIC AND ANAEROBIC Blood Culture results may not be optimal due to an inadequate volume of blood received in culture bottles   Culture  Setup Time   Final    GRAM POSITIVE COCCI IN BOTH AEROBIC AND ANAEROBIC BOTTLES CRITICAL RESULT CALLED TO, READ BACK BY AND VERIFIED WITH:  MIRANDA VBYRK PHARM D 08/15/2024 BY DD @ [phone]    Culture (A)  Final    METHICILLIN RESISTANT STAPHYLOCOCCUS AUREUS CULTURE REINCUBATED FOR BETTER GROWTH Performed at [facility], [address], [place], [place] [address]    Report Status PENDING  Incomplete   Organism ID, Bacteria METHICILLIN RESISTANT STAPHYLOCOCCUS AUREUS  Final      Susceptibility   Methicillin resistant staphylococcus aureus - MIC*    CIPROFLOXACIN  >=8 RESISTANT Resistant     ERYTHROMYCIN >=8 RESISTANT Resistant     GENTAMICIN <=0.5 SENSITIVE Sensitive     OXACILLIN >=4 RESISTANT Resistant     TETRACYCLINE >=16 RESISTANT Resistant     VANCOMYCIN  1 SENSITIVE Sensitive     TRIMETH /SULFA  >=320 RESISTANT Resistant     CLINDAMYCIN  <=0.25  SENSITIVE Sensitive     RIFAMPIN <=0.5 SENSITIVE Sensitive     Inducible Clindamycin  NEGATIVE Sensitive     LINEZOLID 2 SENSITIVE Sensitive     * METHICILLIN RESISTANT STAPHYLOCOCCUS AUREUS  Resp panel by RT-PCR (RSV, Flu A&B, Covid)     Status: None   Collection Time: 08/14/24 11:09 AM   Specimen: Nasal Swab  Result Value Ref Range Status   SARS Coronavirus 2 by RT PCR NEGATIVE NEGATIVE Final   Influenza A by PCR NEGATIVE NEGATIVE Final   Influenza B by PCR NEGATIVE NEGATIVE Final    Comment: (NOTE) The Xpert Xpress SARS-CoV-2/FLU/RSV plus assay is intended as an aid in the diagnosis of influenza from Nasopharyngeal swab specimens and should not be used as a sole basis for treatment. Nasal washings and aspirates are unacceptable for Xpert Xpress SARS-CoV-2/FLU/RSV testing.  Fact Sheet for Patients: bloggercourse.com  Fact Sheet for Healthcare Providers: seriousbroker.it  This test is not yet approved or cleared by the [place]  FDA and has been authorized for detection and/or diagnosis of SARS-CoV-2 by FDA under an Emergency Use Authorization (EUA). This EUA will remain in effect (meaning this test can be used) for the duration of the COVID-19 declaration under Section 564(b)(1) of the Act, 21 U.S.C. section 360bbb-3(b)(1), unless the authorization is terminated or revoked.  Resp Syncytial Virus by PCR NEGATIVE NEGATIVE Final    Comment: (NOTE) Fact Sheet for Patients: bloggercourse.com  Fact Sheet for Healthcare Providers: seriousbroker.it  This test is not yet approved or cleared by the [place]  FDA and has been authorized for detection and/or diagnosis of SARS-CoV-2 by FDA under an Emergency Use Authorization (EUA). This EUA will remain in effect (meaning this test can be used) for the duration of the COVID-19 declaration under Section 564(b)(1) of the Act,  21 U.S.C. section 360bbb-3(b)(1), unless the authorization is terminated or revoked.  Performed at [facility], [address], [place], [place] [address]   Blood Culture ID Panel (Reflexed)     Status: Abnormal   Collection Time: 08/14/24 11:09 AM  Result Value Ref Range Status   Enterococcus faecalis NOT DETECTED NOT DETECTED Final   Enterococcus Faecium NOT DETECTED NOT DETECTED Final   Listeria monocytogenes NOT DETECTED NOT DETECTED Final   Staphylococcus species DETECTED (A) NOT DETECTED Final    Comment: CRITICAL RESULT CALLED TO, READ BACK BY AND VERIFIED WITH:  MIRANDA VBYRK PHARM D 08/15/2024 BY DD @ [phone]    Staphylococcus aureus (BCID) DETECTED (A) NOT DETECTED Final    Comment: Methicillin (oxacillin)-resistant Staphylococcus aureus (MRSA). MRSA is predictably resistant to beta-lactam antibiotics (except ceftaroline). Preferred therapy is vancomycin  unless clinically contraindicated. Patient requires contact precautions if  hospitalized. CRITICAL RESULT CALLED TO, READ BACK BY AND VERIFIED WITH:  MIRANDA VBYRK PHARM D 08/15/2024 BY DD @ 0512    Staphylococcus epidermidis NOT DETECTED NOT DETECTED Final   Staphylococcus lugdunensis NOT DETECTED NOT DETECTED Final   Streptococcus species NOT DETECTED NOT DETECTED Final   Streptococcus agalactiae NOT DETECTED NOT DETECTED Final   Streptococcus pneumoniae NOT DETECTED NOT DETECTED Final   Streptococcus pyogenes NOT DETECTED NOT DETECTED Final   A.calcoaceticus-baumannii NOT DETECTED NOT DETECTED Final   Bacteroides fragilis NOT DETECTED NOT DETECTED Final   Enterobacterales NOT DETECTED NOT DETECTED Final   Enterobacter cloacae complex NOT DETECTED NOT DETECTED Final   Escherichia coli NOT DETECTED NOT DETECTED Final   Klebsiella aerogenes NOT DETECTED NOT DETECTED Final   Klebsiella oxytoca NOT DETECTED NOT DETECTED Final   Klebsiella pneumoniae NOT DETECTED NOT DETECTED Final   Proteus species NOT DETECTED NOT  DETECTED Final   Salmonella species NOT DETECTED NOT DETECTED Final   Serratia marcescens NOT DETECTED NOT DETECTED Final   Haemophilus influenzae NOT DETECTED NOT DETECTED Final   Neisseria meningitidis NOT DETECTED NOT DETECTED Final   Pseudomonas aeruginosa NOT DETECTED NOT DETECTED Final   Stenotrophomonas maltophilia NOT DETECTED NOT DETECTED Final   Candida albicans NOT DETECTED NOT DETECTED Final   Candida auris NOT DETECTED NOT DETECTED Final   Candida glabrata NOT DETECTED NOT DETECTED Final   Candida krusei NOT DETECTED NOT DETECTED Final   Candida parapsilosis NOT DETECTED NOT DETECTED Final   Candida tropicalis NOT DETECTED NOT DETECTED Final   Cryptococcus neoformans/gattii NOT DETECTED NOT DETECTED Final   Meth resistant mecA/C and MREJ DETECTED (A) NOT DETECTED Final    Comment: CRITICAL RESULT CALLED TO, READ BACK BY AND VERIFIED WITH:  MIRANDA VBYRK PHARM D 08/15/2024 BY DD @ [phone] Performed at [facility], [address], [place], [place] [address]   Culture, blood (routine x 2)     Status: Abnormal   Collection Time: 08/14/24 11:14 AM   Specimen: BLOOD RIGHT WRIST  Result Value Ref Range Status   Specimen Description BLOOD RIGHT WRIST  Final  Special Requests   Final    BOTTLES DRAWN AEROBIC AND ANAEROBIC Blood Culture results may not be optimal due to an inadequate volume of blood received in culture bottles   Culture  Setup Time   Final    GRAM POSITIVE COCCI IN BOTH AEROBIC AND ANAEROBIC BOTTLES CRITICAL VALUE NOTED.  VALUE IS CONSISTENT WITH PREVIOUSLY REPORTED AND CALLED VALUE.    Culture (A)  Final    STAPHYLOCOCCUS AUREUS SUSCEPTIBILITIES PERFORMED ON PREVIOUS CULTURE WITHIN THE LAST 5 DAYS. Performed at [facility], [name] [place], [place] [address]    Report Status 08/17/2024 FINAL  Final  Culture, blood (Routine X 2) w Reflex to ID Panel     Status: None (Preliminary result)   Collection Time: 08/16/24  8:06 PM   Specimen:  BLOOD  Result Value Ref Range Status   Specimen Description BLOOD SITE NOT SPECIFIED  Final   Special Requests   Final    BOTTLES DRAWN AEROBIC AND ANAEROBIC Blood Culture adequate volume   Culture   Final    NO GROWTH < 12 HOURS Performed at [facility], [address], [place], [place] [address]    Report Status PENDING  Incomplete  Culture, blood (Routine X 2) w Reflex to ID Panel     Status: None (Preliminary result)   Collection Time: 08/16/24  8:06 PM   Specimen: BLOOD  Result Value Ref Range Status   Specimen Description BLOOD SITE NOT SPECIFIED  Final   Special Requests   Final    BOTTLES DRAWN AEROBIC AND ANAEROBIC Blood Culture adequate volume   Culture   Final    NO GROWTH < 12 HOURS Performed at [facility], [address] [place], [place] [address]    Report Status PENDING  Incomplete    RADIOLOGY STUDIES/RESULTS: CT SHOULDER RIGHT W CONTRAST Result Date: 08/16/2024 EXAM: CT RIGHT SHOULDER, WITH IV CONTRAST 08/16/2024 06:11:55 AM TECHNIQUE: Axial images were acquired through the right shoulder with IV contrast. Reformatted images were reviewed. 75 mL of iohexol  (OMNIPAQUE ) 350 MG/ML injection was administered intravenously. Automated exposure control, iterative reconstruction, and/or weight based adjustment of the mA/kV was utilized to reduce the radiation dose to as low as reasonably achievable. COMPARISON: CT with IV contrast from 08/11/2024 and 05/24/2024. CLINICAL HISTORY: Shoulder pain. FINDINGS: BONES: There is no evidence of fractures or focal suspicious bone lesions. Subcortical cystic changes extend along the greater tuberosity likely degenerative etiology. JOINTS: No dislocation. The acromioclavicular and glenohumeral joints are intact. There is no substantial joint effusion. There is slight periarticular spurring of the acromioclavicular and glenohumeral joints, mild narrowing of the glenohumeral joint. There is no erosive arthropathy. SOFT TISSUES:  The rotator cuff muscles are normal in volume. The tendons are not well evaluated with CT, but appear to be grossly intact. There are multiple enhancing venous collateral vessels in the supraclavicular fossa and the deltoid and pectoralis major muscles. LUNGS AND PLEURAL SPACES: Pleuroparenchymal scarring opacities and ground glass opacity in the right upper lobe are unchanged. There is increased small layering right pleural effusion noted since 08/11/2024. IMPRESSION: 1. No acute osseous abnormality. Mild degenerative features. 2. Multiple enhancing venous collateral vessels in the supraclavicular fossa and the deltoid and pectoralis major muscles. 3. Increased small layering right pleural effusion since 08/11/2024. Electronically signed by: Francis Quam MD 08/16/2024 06:30 AM EST RP Workstation: [id number]   ECHOCARDIOGRAM COMPLETE Result Date: 08/15/2024    ECHOCARDIOGRAM REPORT   Patient Name:  DEBBY KATHEE HOIT Date of Exam: 08/15/2024 Medical Rec #:  [id number]     Height:       68.0 in Accession #:    [id number]    Weight:       242.7 lb Date of Birth:  [birth year]    BSA:          2.219 m Patient Age:    63 years      BP:           123/92 mmHg Patient Gender: M             HR:           75 bpm. Exam Location:  Inpatient Procedure: 2D Echo, Cardiac Doppler and Color Doppler (Both Spectral and Color            Flow Doppler were utilized during procedure). Indications:    Bacteremia  History:        Patient has prior history of Echocardiogram examinations, most                 recent 03/26/2024. Stroke, Arrythmias:Atrial Fibrillation and                 Atrial Flutter, Signs/Symptoms:Bacteremia; Risk                 Factors:Diabetes, Hypertension and Current Smoker.  Sonographer:    Juliene Rucks Referring Phys: [id number] MARSA KATHEE MELVIN  Sonographer Comments: Patient is obese. IMPRESSIONS  1. Left ventricular ejection fraction, by estimation, is 60 to 65%. The left ventricle has normal function. The left  ventricle has no regional wall motion abnormalities. The left ventricular internal cavity size was mildly dilated. There is mild left ventricular hypertrophy. Left ventricular diastolic parameters are indeterminate.  2. Right ventricular systolic function is normal. The right ventricular size is normal.  3. The mitral valve is normal in structure. No evidence of mitral valve regurgitation. No evidence of mitral stenosis.  4. The aortic valve is tricuspid. Aortic valve regurgitation is not visualized. Aortic valve sclerosis/calcification is present, without any evidence of aortic stenosis.  5. The inferior vena cava is normal in size with greater than 50% respiratory variability, suggesting right atrial pressure of 3 mmHg. FINDINGS  Left Ventricle: Left ventricular ejection fraction, by estimation, is 60 to 65%. The left ventricle has normal function. The left ventricle has no regional wall motion abnormalities. The left ventricular internal cavity size was mildly dilated. There is  mild left ventricular hypertrophy. Left ventricular diastolic parameters are indeterminate. Right Ventricle: The right ventricular size is normal. Right ventricular systolic function is normal. Left Atrium: Left atrial size was normal in size. Right Atrium: Right atrial size was normal in size. Pericardium: There is no evidence of pericardial effusion. Mitral Valve: The mitral valve is normal in structure. Mild mitral annular calcification. No evidence of mitral valve regurgitation. No evidence of mitral valve stenosis. Tricuspid Valve: The tricuspid valve is normal in structure. Tricuspid valve regurgitation is trivial. No evidence of tricuspid stenosis. Aortic Valve: The aortic valve is tricuspid. Aortic valve regurgitation is not visualized. Aortic valve sclerosis/calcification is present, without any evidence of aortic stenosis. Pulmonic Valve: The pulmonic valve was not well visualized. Pulmonic valve regurgitation is not visualized.  No evidence of pulmonic stenosis. Aorta: The aortic root is normal in size and structure. Venous: The inferior vena cava is normal in size with greater than 50% respiratory variability, suggesting right atrial pressure of 3 mmHg. IAS/Shunts: The interatrial septum  was not well visualized.  LEFT VENTRICLE PLAX 2D LVIDd:         5.60 cm   Diastology LVIDs:         4.30 cm   LV e' medial:    11.20 cm/s LV PW:         1.40 cm   LV E/e' medial:  12.0 LV IVS:        1.20 cm   LV e' lateral:   12.50 cm/s LVOT diam:     2.20 cm   LV E/e' lateral: 10.7 LV SV:         78 LV SV Index:   35 LVOT Area:     3.80 cm  RIGHT VENTRICLE             IVC RV Basal diam:  4.40 cm     IVC diam: 1.70 cm RV Mid diam:    3.80 cm RV S prime:     16.40 cm/s TAPSE (M-mode): 2.3 cm LEFT ATRIUM           Index LA diam:      5.20 cm 2.34 cm/m LA Vol (A2C): 85.3 ml 38.44 ml/m LA Vol (A4C): 81.7 ml 36.82 ml/m  AORTIC VALVE LVOT Vmax:   127.00 cm/s LVOT Vmean:  86.500 cm/s LVOT VTI:    0.206 m  AORTA Ao Root diam: 3.50 cm MITRAL VALVE                TRICUSPID VALVE MV Area (PHT): 3.72 cm     TR Peak grad:   25.4 mmHg MV Decel Time: 204 msec     TR Vmax:        252.00 cm/s MV E velocity: 134.00 cm/s MV A velocity: 33.50 cm/s   SHUNTS MV E/A ratio:  4.00         Systemic VTI:  0.21 m                             Systemic Diam: 2.20 cm Redell Shallow MD Electronically signed by Redell Shallow MD Signature Date/Time: 08/15/2024/2:40:02 PM    Final      LOS: 3 days   Donalda Applebaum, MD  [facility]    To contact the attending provider between 7A-7P or the covering provider during after hours 7P-7A, please log into the web site [email or web address] and access using universal [place] password for that web site. If you do not have the password, please call the hospital operator.  08/17/2024, 10:17 AM    "

## 2024-08-18 DIAGNOSIS — Z86711 Personal history of pulmonary embolism: Secondary | ICD-10-CM | POA: Diagnosis not present

## 2024-08-18 DIAGNOSIS — R7881 Bacteremia: Secondary | ICD-10-CM | POA: Diagnosis not present

## 2024-08-18 DIAGNOSIS — K7460 Unspecified cirrhosis of liver: Secondary | ICD-10-CM | POA: Diagnosis not present

## 2024-08-18 DIAGNOSIS — I480 Paroxysmal atrial fibrillation: Secondary | ICD-10-CM | POA: Diagnosis not present

## 2024-08-18 LAB — MINIMUM INHIBITORY CONC. (1 DRUG)

## 2024-08-18 LAB — GLUCOSE, CAPILLARY
Glucose-Capillary: 146 mg/dL — ABNORMAL HIGH (ref 70–99)
Glucose-Capillary: 230 mg/dL — ABNORMAL HIGH (ref 70–99)
Glucose-Capillary: 176 mg/dL — ABNORMAL HIGH (ref 70–99)
Glucose-Capillary: 169 mg/dL — ABNORMAL HIGH (ref 70–99)

## 2024-08-18 LAB — MIC RESULT

## 2024-08-18 MED ORDER — OXYCODONE HCL 5 MG PO TABS
5.0000 mg | ORAL_TABLET | Freq: Once | ORAL | Status: AC
  Administered 2024-08-18: 5 mg via ORAL
  Filled 2024-08-18: qty 1

## 2024-08-18 MED ORDER — ALUM & MAG HYDROXIDE-SIMETH 200-200-20 MG/5ML PO SUSP
30.0000 mL | Freq: Once | ORAL | Status: AC
  Administered 2024-08-18: 30 mL via ORAL
  Filled 2024-08-18: qty 30

## 2024-08-18 NOTE — Plan of Care (Incomplete)

## 2024-08-18 NOTE — Plan of Care (Signed)
" °  Problem: Metabolic: Goal: Ability to maintain appropriate glucose levels will improve Outcome: Progressing   Problem: Education: Goal: Knowledge of General Education information will improve Description: Including pain rating scale, medication(s)/side effects and non-pharmacologic comfort measures Outcome: Progressing   Problem: Health Behavior/Discharge Planning: Goal: Ability to manage health-related needs will improve Outcome: Progressing   Problem: Clinical Measurements: Goal: Ability to maintain clinical measurements within normal limits will improve Outcome: Progressing Goal: Will remain free from infection Outcome: Progressing Goal: Diagnostic test results will improve Outcome: Progressing   "

## 2024-08-18 NOTE — Progress Notes (Signed)
 "                        PROGRESS NOTE        PATIENT DETAILS Name: Cory Palmer Age: 64 y.o. Sex: male Date of Birth: 1960/12/29 Admit Date: 08/14/2024 Admitting Physician Marsa KATHEE Scurry, MD ERE:Amntw, Rojelio Caldron, NP  Brief Summary: Patient is a 64 y.o.  male with history of B/L BKA-homelessness-cocaine/EtOH use-urine A-fib/PE on Eliquis -was recently hospitalized from 1/11-1/12-4 sepsis-blood cultures were positive for MRSA but patient unfortunately signed out AMA-presented to the hospital on 1/14 with subjective fever/fatigue/not feeling well-and was subsequently admitted to the hospitalist service.  Significant events: 1/11-1/12>> admitted for sepsis-blood cultures positive for MRSA-patient signed out AMA 1/14>> admit to [facility]  Significant studies: 1/11>> CT head: No acute intracranial process 1/11>> CT chest/abdomen/pelvis: No acute abnormality in the chest/abdomen/pelvis 1/15>>  TTE: EF 60-65%, no obvious vegetation seen. 1/16>> CT right shoulder: No acute osseous abnormality.  Significant microbiology data: 1/14>> COVID/influenza/RSV PCR: Negative 1/11>> blood culture: MRSA 1/14>> blood culture: MRSA 1/16>> blood culture: No growth  Procedures: None  Consults: ID  Subjective: Unchanged-no major issues overnight.  Lying comfortably in bed.  Objective: Vitals: Blood pressure 139/88, pulse 78, temperature 97.6 F (36.4 C), temperature source Oral, resp. rate 15, height 6' (1.829 m), weight 110.4 kg, SpO2 96%.   Exam: Gen Exam:Alert awake-not in any distress HEENT:atraumatic, normocephalic Chest: B/L clear to auscultation anteriorly CVS:S1S2 regular Abdomen:soft non tender, non distended Extremities: B/L BKA Neurology: Non focal Skin: no rash  Pertinent Labs/Radiology:    Latest Ref Rng & Units 08/17/2024    6:48 AM 08/15/2024    1:44 AM 08/14/2024   12:10 PM  CBC  WBC 4.0 - 10.5 K/uL 5.0  2.6  3.5   Hemoglobin 13.0 - 17.0 g/dL 88.8  89.8  88.4    Hematocrit 39.0 - 52.0 % 30.6  29.4  32.7   Platelets 150 - 400 K/uL 67  42  53     Lab Results  Component Value Date   NA 133 (L) 08/17/2024   K 3.5 08/17/2024   CL 101 08/17/2024   CO2 22 08/17/2024      Assessment/Plan: MRSA bacteremia (prior history of MRSA bacteremia August 2025, March 2021 and March 2020) S/p 1 dose of oritavancin on 1/15-as he was starting to leave AMA He now has decided to stay in the hospital until his workup is complete TEE tentatively scheduled for Monday or Tuesday-seems like patient has decided to stay in the hospital till then.   Repeat blood cultures on 1/16 negative so far.   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  PAF Telemetry monitoring Non compliant to Eliquis  (furthermore has history of recent GI bleed) See above  DM-2 (A1c 5.7on  03/24/2024) SSI  Recent Labs    08/17/24 1533 08/17/24 2112 08/18/24 0755  GLUCAP 196* 155* 146*     Bipolar disorder/depression/anxiety Trazodone  Supportive care  EtOH use No withdrawal symptoms currently Ativan  per [facility] protocol  Cocaine use Claims he uses cocaine several times a month-very vague about the route he  uses As needed Ativan  for withdrawal symptoms  Homelessness TOC eval  Noncompliance Left AMA recently Claims that he does not have any major medical problems and does not take any prescription medications-however has above-noted history and is supposed to be on above-noted  medications. Has been counseled  Class 1 Obesity Estimated body mass index is 33.01 kg/m as calculated from the following:   Height as of this encounter: 6' (1.829 m).   Weight as of this encounter: 110.4 kg.   Code status:   Code Status: Full Code   DVT Prophylaxis: SCD's    Family Communication: None at bedside   Disposition Plan: Status is: Inpatient Remains inpatient appropriate because: Severity of illness   Planned Discharge Destination:Home   Diet: Diet Order             Diet regular Room service appropriate? Yes with Assist; Fluid consistency: Thin  Diet effective now                     Antimicrobial agents: Anti-infectives (From admission, onward)    Start     Dose/Rate Route Frequency Ordered Stop   08/15/24 1130  dalbavancin (DALVANCE ) 1,500 mg in dextrose  5 % 500 mL IVPB        1,500 mg 967.7 mL/hr over 31 Minutes Intravenous Once 08/15/24 1123 08/15/24 1253   08/14/24 1630  DAPTOmycin  (CUBICIN ) IVPB 700 mg/173mL premix  Status:  Discontinued        8 mg/kg  85.1 kg (Adjusted) 200 mL/hr over 30 Minutes Intravenous Daily 08/14/24 1620 08/15/24 1123        MEDICATIONS: Scheduled Meds:  insulin  aspart  0-15 Units Subcutaneous TID WC   pantoprazole   40 mg Oral BID AC   sodium chloride  flush  3 mL Intravenous Q12H   Continuous Infusions: PRN Meds:.acetaminophen  **OR** acetaminophen , polyethylene glycol, traZODone    I have personally reviewed following labs and imaging studies  LABORATORY DATA: CBC: Recent Labs  Lab 08/11/24 1702 08/12/24 0530 08/14/24 1210 08/15/24 0144 08/17/24 0648  WBC 6.6 4.0 3.5* 2.6* 5.0  NEUTROABS 5.7  --  3.0  --   --   HGB 12.9* 10.7* 11.5* 10.1* 11.1*  HCT 36.6* 32.1* 32.7* 29.4* 30.6*  MCV 96.3 99.1 94.8 97.0 92.7  PLT 77* 54* 53* 42* 67*    Basic Metabolic Panel: Recent Labs  Lab 08/11/24 1702 08/11/24 2320 08/12/24 0530 08/14/24 1430 08/15/24 0144 08/17/24 0648  NA 132*  --  133* 133*  129* 133*  K 3.6  --  3.2* 3.3* 3.4* 3.5  CL 98  --  103 100 98 101  CO2 21*  --  18* 20* 20* 22  GLUCOSE 175*  --  119* 159* 214* 158*  BUN 11  --  12 11 13  7*  CREATININE 0.82  --  0.79 0.69 0.76 0.52*  CALCIUM  8.9  --  7.6* 8.2* 7.9* 8.2*  MG  --  1.1* 1.7 1.5*  --   --     GFR: Estimated Creatinine Clearance: 119.7 mL/min (A) (by C-G formula based on SCr of 0.52 mg/dL (L)).  Liver Function Tests: Recent Labs  Lab 08/11/24 1702 08/14/24 1430 08/15/24 0144  AST 36 43* 32  ALT 14 19 16   ALKPHOS 95 91 88  BILITOT 2.3* 1.9* 1.8*  PROT 7.4 6.3* 5.5*  ALBUMIN 3.7 3.2* 2.9*   No results for input(s): LIPASE, AMYLASE in the last 168 hours. Recent Labs  Lab 08/11/24 1702  AMMONIA 53*  Coagulation Profile: Recent Labs  Lab 08/11/24 1702 08/15/24 0144  INR 1.5* 1.7*    Cardiac Enzymes: No results for input(s): CKTOTAL, CKMB, CKMBINDEX, TROPONINI in the last 168 hours.  BNP (last 3 results) No results for input(s): PROBNP in the last 8760 hours.  Lipid Profile: No results for input(s): CHOL, HDL, LDLCALC, TRIG, CHOLHDL, LDLDIRECT in the last 72 hours.  Thyroid Function Tests: No results for input(s): TSH, T4TOTAL, FREET4, T3FREE, THYROIDAB in the last 72 hours.  Anemia Panel: No results for input(s): VITAMINB12, FOLATE, FERRITIN, TIBC, IRON, RETICCTPCT in the last 72 hours.  Urine analysis:    Component Value Date/Time   COLORURINE AMBER (A) 08/11/2024 1827   APPEARANCEUR CLEAR 08/11/2024 1827   LABSPEC 1.024 08/11/2024 1827   PHURINE 6.0 08/11/2024 1827   GLUCOSEU NEGATIVE 08/11/2024 1827   HGBUR LARGE (A) 08/11/2024 1827   BILIRUBINUR NEGATIVE 08/11/2024 1827   KETONESUR NEGATIVE 08/11/2024 1827   PROTEINUR 30 (A) 08/11/2024 1827   NITRITE NEGATIVE 08/11/2024 1827   LEUKOCYTESUR NEGATIVE 08/11/2024 1827    Sepsis Labs: Lactic Acid, Venous    Component Value Date/Time   LATICACIDVEN 1.4 08/14/2024  1321    MICROBIOLOGY: Recent Results (from the past 240 hours)  Resp panel by RT-PCR (RSV, Flu A&B, Covid) Anterior Nasal Swab     Status: None   Collection Time: 08/11/24  3:50 PM   Specimen: Anterior Nasal Swab  Result Value Ref Range Status   SARS Coronavirus 2 by RT PCR NEGATIVE NEGATIVE Final    Comment: (NOTE) SARS-CoV-2 target nucleic acids are NOT DETECTED.  The SARS-CoV-2 RNA is generally detectable in upper respiratory specimens during the acute phase of infection. The lowest concentration of SARS-CoV-2 viral copies this assay can detect is 138 copies/mL. A negative result does not preclude SARS-Cov-2 infection and should not be used as the sole basis for treatment or other patient management decisions. A negative result may occur with  improper specimen collection/handling, submission of specimen other than nasopharyngeal swab, presence of viral mutation(s) within the areas targeted by this assay, and inadequate number of viral copies(<138 copies/mL). A negative result must be combined with clinical observations, patient history, and epidemiological information. The expected result is Negative.  Fact Sheet for Patients:  bloggercourse.com  Fact Sheet for Healthcare Providers:  seriousbroker.it  This test is no t yet approved or cleared by the [place]  FDA and  has been authorized for detection and/or diagnosis of SARS-CoV-2 by FDA under an Emergency Use Authorization (EUA). This EUA will remain  in effect (meaning this test can be used) for the duration of the COVID-19 declaration under Section 564(b)(1) of the Act, 21 U.S.C.section 360bbb-3(b)(1), unless the authorization is terminated  or revoked sooner.       Influenza A by PCR NEGATIVE NEGATIVE Final   Influenza B by PCR NEGATIVE NEGATIVE Final    Comment: (NOTE) The Xpert Xpress SARS-CoV-2/FLU/RSV plus assay is intended as an aid in the diagnosis of  influenza from Nasopharyngeal swab specimens and should not be used as a sole basis for treatment. Nasal washings and aspirates are unacceptable for Xpert Xpress SARS-CoV-2/FLU/RSV testing.  Fact Sheet for Patients: bloggercourse.com  Fact Sheet for Healthcare Providers: seriousbroker.it  This test is not yet approved or cleared by the [place]  FDA and has been authorized for detection and/or diagnosis of SARS-CoV-2 by FDA under an Emergency Use Authorization (EUA). This EUA will remain in effect (meaning this test can be used) for the duration of the  COVID-19 declaration under Section 564(b)(1) of the Act, 21 U.S.C. section 360bbb-3(b)(1), unless the authorization is terminated or revoked.     Resp Syncytial Virus by PCR NEGATIVE NEGATIVE Final    Comment: (NOTE) Fact Sheet for Patients: bloggercourse.com  Fact Sheet for Healthcare Providers: seriousbroker.it  This test is not yet approved or cleared by the [place]  FDA and has been authorized for detection and/or diagnosis of SARS-CoV-2 by FDA under an Emergency Use Authorization (EUA). This EUA will remain in effect (meaning this test can be used) for the duration of the COVID-19 declaration under Section 564(b)(1) of the Act, 21 U.S.C. section 360bbb-3(b)(1), unless the authorization is terminated or revoked.  Performed at [facility], [address] [place], [place] [address]   Blood Culture (routine x 2)     Status: Abnormal (Preliminary result)   Collection Time: 08/11/24  5:02 PM   Specimen: BLOOD RIGHT ARM  Result Value Ref Range Status   Specimen Description   Final    BLOOD RIGHT ARM Performed at [facility], [address], [place], [place] [address]    Special Requests   Final    BOTTLES DRAWN AEROBIC AND ANAEROBIC Blood Culture results may not be optimal due to an  inadequate volume of blood received in culture bottles Performed at [facility], [address] [place], [place] [address]    Culture  Setup Time   Final    GRAM POSITIVE COCCI IN BOTH AEROBIC AND ANAEROBIC BOTTLES CRITICAL RESULT CALLED TO, READ BACK BY AND VERIFIED WITH: N. GLOGOVAC PHARMD, AT 9081 08/12/24 D. JEARLEAN    Culture (A)  Final    METHICILLIN RESISTANT STAPHYLOCOCCUS AUREUS Sent to [facility] for further susceptibility testing. Performed at [facility], [address], [place], [place] [address]    Report Status PENDING  Incomplete   Organism ID, Bacteria METHICILLIN RESISTANT STAPHYLOCOCCUS AUREUS  Final      Susceptibility   Methicillin resistant staphylococcus aureus - MIC*    CIPROFLOXACIN  >=8 RESISTANT Resistant     ERYTHROMYCIN >=8 RESISTANT Resistant     GENTAMICIN <=0.5 SENSITIVE Sensitive     OXACILLIN >=4 RESISTANT Resistant     TETRACYCLINE >=16 RESISTANT Resistant     VANCOMYCIN  <=0.5 SENSITIVE Sensitive     TRIMETH /SULFA  >=320 RESISTANT Resistant     CLINDAMYCIN  <=0.25 SENSITIVE Sensitive     RIFAMPIN <=0.5 SENSITIVE Sensitive     Inducible Clindamycin  NEGATIVE Sensitive     LINEZOLID 2 SENSITIVE Sensitive     * METHICILLIN RESISTANT STAPHYLOCOCCUS AUREUS  Blood Culture ID Panel (Reflexed)     Status: Abnormal   Collection Time: 08/11/24  5:02 PM  Result Value Ref Range Status   Enterococcus faecalis NOT DETECTED NOT DETECTED Final   Enterococcus Faecium NOT DETECTED NOT DETECTED Final   Listeria monocytogenes NOT DETECTED NOT DETECTED Final   Staphylococcus species DETECTED (A) NOT DETECTED Final    Comment: CRITICAL RESULT CALLED TO, READ BACK BY AND VERIFIED WITH: N. GLOGOVAC PHARMD, AT 9081 08/12/24 D. VANHOOK    Staphylococcus aureus (BCID) DETECTED (A) NOT DETECTED Final    Comment: Methicillin (oxacillin)-resistant Staphylococcus aureus (MRSA). MRSA is predictably resistant to beta-lactam antibiotics (except  ceftaroline). Preferred therapy is vancomycin  unless clinically contraindicated. Patient requires contact precautions if  hospitalized. CRITICAL RESULT CALLED TO, READ BACK BY AND VERIFIED WITH: N. GLOGOVAC PHARMD, AT 9081 08/12/24 D. VANHOOK    Staphylococcus epidermidis NOT DETECTED NOT DETECTED Final  Staphylococcus lugdunensis NOT DETECTED NOT DETECTED Final   Streptococcus species NOT DETECTED NOT DETECTED Final   Streptococcus agalactiae NOT DETECTED NOT DETECTED Final   Streptococcus pneumoniae NOT DETECTED NOT DETECTED Final   Streptococcus pyogenes NOT DETECTED NOT DETECTED Final   A.calcoaceticus-baumannii NOT DETECTED NOT DETECTED Final   Bacteroides fragilis NOT DETECTED NOT DETECTED Final   Enterobacterales NOT DETECTED NOT DETECTED Final   Enterobacter cloacae complex NOT DETECTED NOT DETECTED Final   Escherichia coli NOT DETECTED NOT DETECTED Final   Klebsiella aerogenes NOT DETECTED NOT DETECTED Final   Klebsiella oxytoca NOT DETECTED NOT DETECTED Final   Klebsiella pneumoniae NOT DETECTED NOT DETECTED Final   Proteus species NOT DETECTED NOT DETECTED Final   Salmonella species NOT DETECTED NOT DETECTED Final   Serratia marcescens NOT DETECTED NOT DETECTED Final   Haemophilus influenzae NOT DETECTED NOT DETECTED Final   Neisseria meningitidis NOT DETECTED NOT DETECTED Final   Pseudomonas aeruginosa NOT DETECTED NOT DETECTED Final   Stenotrophomonas maltophilia NOT DETECTED NOT DETECTED Final   Candida albicans NOT DETECTED NOT DETECTED Final   Candida auris NOT DETECTED NOT DETECTED Final   Candida glabrata NOT DETECTED NOT DETECTED Final   Candida krusei NOT DETECTED NOT DETECTED Final   Candida parapsilosis NOT DETECTED NOT DETECTED Final   Candida tropicalis NOT DETECTED NOT DETECTED Final   Cryptococcus neoformans/gattii NOT DETECTED NOT DETECTED Final   Meth resistant mecA/C and MREJ DETECTED (A) NOT DETECTED Final    Comment: CRITICAL RESULT CALLED TO, READ  BACK BY AND VERIFIED WITH: N. GLOGOVAC PHARMD, AT 9081 08/12/24 CHARM CARMIN Performed at [facility], [address], [place], [place] [address]   MIC (1 Drug)-     Status: None   Collection Time: 08/11/24  5:02 PM  Result Value Ref Range Status   Min Inhibitory Conc (1 Drug) BLOOD  Final   Source [id number] mrsa dapto blood  Corrected    Comment: Performed at [facility], [address], [place], [place] [address] CORRECTED ON 01/15 AT 1315: PREVIOUSLY REPORTED AS BLOOD/ MRSA/ DAPTOMYCIN    MIC (1 Drug)-     Status: Abnormal   Collection Time: 08/11/24  5:02 PM  Result Value Ref Range Status   Min Inhibitory Conc (1 Drug) Preliminary report (A)  Final    Comment: (NOTE) Performed At: [facility] [address] [place], [place] [id number] Jennette Shorter MD Ey:[id number]    Source MRSA/DAPTOMYCIN / BLOOD  Final    Comment: Performed at [facility], [address], [place], [place] [address]  MIC Result     Status: Abnormal   Collection Time: 08/11/24  5:02 PM  Result Value Ref Range Status   Result 1 (MIC) Comment (A)  Final    Comment: (NOTE) Methicillin - resistant Staphylococcus aureus Identification performed by account, not confirmed by this laboratory. DAPTOMYCIN  Performed At: [facility] [address] [place], [place] [id number] Jennette Shorter MD Ey:[id number]   Respiratory (~20 pathogens) panel by PCR     Status: None   Collection Time: 08/12/24  3:30 AM   Specimen: Nasopharyngeal Swab; Respiratory  Result Value Ref Range Status   Adenovirus NOT DETECTED NOT DETECTED Final   Coronavirus 229E NOT DETECTED NOT DETECTED Final    Comment: (NOTE) The Coronavirus on the Respiratory Panel, DOES NOT test for the novel  Coronavirus (2019 nCoV)    Coronavirus HKU1 NOT DETECTED NOT DETECTED Final   Coronavirus NL63 NOT DETECTED NOT DETECTED Final  Coronavirus OC43 NOT DETECTED NOT DETECTED Final   Metapneumovirus NOT DETECTED NOT  DETECTED Final   Rhinovirus / Enterovirus NOT DETECTED NOT DETECTED Final   Influenza A NOT DETECTED NOT DETECTED Final   Influenza B NOT DETECTED NOT DETECTED Final   Parainfluenza Virus 1 NOT DETECTED NOT DETECTED Final   Parainfluenza Virus 2 NOT DETECTED NOT DETECTED Final   Parainfluenza Virus 3 NOT DETECTED NOT DETECTED Final   Parainfluenza Virus 4 NOT DETECTED NOT DETECTED Final   Respiratory Syncytial Virus NOT DETECTED NOT DETECTED Final   Bordetella pertussis NOT DETECTED NOT DETECTED Final   Bordetella Parapertussis NOT DETECTED NOT DETECTED Final   Chlamydophila pneumoniae NOT DETECTED NOT DETECTED Final   Mycoplasma pneumoniae NOT DETECTED NOT DETECTED Final    Comment: Performed at [facility], [address], [place], [place] [address]  Culture, blood (routine x 2)     Status: Abnormal (Preliminary result)   Collection Time: 08/14/24 11:09 AM   Specimen: BLOOD LEFT HAND  Result Value Ref Range Status   Specimen Description BLOOD LEFT HAND  Final   Special Requests   Final    BOTTLES DRAWN AEROBIC AND ANAEROBIC Blood Culture results may not be optimal due to an inadequate volume of blood received in culture bottles   Culture  Setup Time   Final    GRAM POSITIVE COCCI IN BOTH AEROBIC AND ANAEROBIC BOTTLES CRITICAL RESULT CALLED TO, READ BACK BY AND VERIFIED WITH:  MIRANDA VBYRK PHARM D 08/15/2024 BY DD @ 9487    Culture (A)  Final    METHICILLIN RESISTANT STAPHYLOCOCCUS AUREUS Sent to [facility] for further susceptibility testing. Performed at [facility], [address], [place], [place] [address]    Report Status PENDING  Incomplete   Organism ID, Bacteria METHICILLIN RESISTANT STAPHYLOCOCCUS AUREUS  Final      Susceptibility   Methicillin resistant staphylococcus aureus - MIC*    CIPROFLOXACIN  >=8 RESISTANT Resistant     ERYTHROMYCIN >=8 RESISTANT Resistant     GENTAMICIN <=0.5 SENSITIVE Sensitive     OXACILLIN >=4 RESISTANT Resistant      TETRACYCLINE >=16 RESISTANT Resistant     VANCOMYCIN  1 SENSITIVE Sensitive     TRIMETH /SULFA  >=320 RESISTANT Resistant     CLINDAMYCIN  <=0.25 SENSITIVE Sensitive     RIFAMPIN <=0.5 SENSITIVE Sensitive     Inducible Clindamycin  NEGATIVE Sensitive     LINEZOLID 2 SENSITIVE Sensitive     * METHICILLIN RESISTANT STAPHYLOCOCCUS AUREUS  Resp panel by RT-PCR (RSV, Flu A&B, Covid)     Status: None   Collection Time: 08/14/24 11:09 AM   Specimen: Nasal Swab  Result Value Ref Range Status   SARS Coronavirus 2 by RT PCR NEGATIVE NEGATIVE Final   Influenza A by PCR NEGATIVE NEGATIVE Final   Influenza B by PCR NEGATIVE NEGATIVE Final    Comment: (NOTE) The Xpert Xpress SARS-CoV-2/FLU/RSV plus assay is intended as an aid in the diagnosis of influenza from Nasopharyngeal swab specimens and should not be used as a sole basis for treatment. Nasal washings and aspirates are unacceptable for Xpert Xpress SARS-CoV-2/FLU/RSV testing.  Fact Sheet for Patients: bloggercourse.com  Fact Sheet for Healthcare Providers: seriousbroker.it  This test is not yet approved or cleared by the [place]  FDA and has been authorized for detection and/or diagnosis of SARS-CoV-2 by FDA under an Emergency Use Authorization (EUA). This EUA will remain in effect (meaning this test can be used) for the duration of the COVID-19 declaration  under Section 564(b)(1) of the Act, 21 U.S.C. section 360bbb-3(b)(1), unless the authorization is terminated or revoked.     Resp Syncytial Virus by PCR NEGATIVE NEGATIVE Final    Comment: (NOTE) Fact Sheet for Patients: bloggercourse.com  Fact Sheet for Healthcare Providers: seriousbroker.it  This test is not yet approved or cleared by the [place]  FDA and has been authorized for detection and/or diagnosis of SARS-CoV-2 by FDA under an Emergency Use Authorization (EUA).  This EUA will remain in effect (meaning this test can be used) for the duration of the COVID-19 declaration under Section 564(b)(1) of the Act, 21 U.S.C. section 360bbb-3(b)(1), unless the authorization is terminated or revoked.  Performed at [facility], [address], [place], [place] [address]   Blood Culture ID Panel (Reflexed)     Status: Abnormal   Collection Time: 08/14/24 11:09 AM  Result Value Ref Range Status   Enterococcus faecalis NOT DETECTED NOT DETECTED Final   Enterococcus Faecium NOT DETECTED NOT DETECTED Final   Listeria monocytogenes NOT DETECTED NOT DETECTED Final   Staphylococcus species DETECTED (A) NOT DETECTED Final    Comment: CRITICAL RESULT CALLED TO, READ BACK BY AND VERIFIED WITH:  MIRANDA VBYRK PHARM D 08/15/2024 BY DD @ 9487    Staphylococcus aureus (BCID) DETECTED (A) NOT DETECTED Final    Comment: Methicillin (oxacillin)-resistant Staphylococcus aureus (MRSA). MRSA is predictably resistant to beta-lactam antibiotics (except ceftaroline). Preferred therapy is vancomycin  unless clinically contraindicated. Patient requires contact precautions if  hospitalized. CRITICAL RESULT CALLED TO, READ BACK BY AND VERIFIED WITH:  MIRANDA VBYRK PHARM D 08/15/2024 BY DD @ 0512    Staphylococcus epidermidis NOT DETECTED NOT DETECTED Final   Staphylococcus lugdunensis NOT DETECTED NOT DETECTED Final   Streptococcus species NOT DETECTED NOT DETECTED Final   Streptococcus agalactiae NOT DETECTED NOT DETECTED Final   Streptococcus pneumoniae NOT DETECTED NOT DETECTED Final   Streptococcus pyogenes NOT DETECTED NOT DETECTED Final   A.calcoaceticus-baumannii NOT DETECTED NOT DETECTED Final   Bacteroides fragilis NOT DETECTED NOT DETECTED Final   Enterobacterales NOT DETECTED NOT DETECTED Final   Enterobacter cloacae complex NOT DETECTED NOT DETECTED Final   Escherichia coli NOT DETECTED NOT DETECTED Final   Klebsiella aerogenes NOT DETECTED NOT DETECTED Final    Klebsiella oxytoca NOT DETECTED NOT DETECTED Final   Klebsiella pneumoniae NOT DETECTED NOT DETECTED Final   Proteus species NOT DETECTED NOT DETECTED Final   Salmonella species NOT DETECTED NOT DETECTED Final   Serratia marcescens NOT DETECTED NOT DETECTED Final   Haemophilus influenzae NOT DETECTED NOT DETECTED Final   Neisseria meningitidis NOT DETECTED NOT DETECTED Final   Pseudomonas aeruginosa NOT DETECTED NOT DETECTED Final   Stenotrophomonas maltophilia NOT DETECTED NOT DETECTED Final   Candida albicans NOT DETECTED NOT DETECTED Final   Candida auris NOT DETECTED NOT DETECTED Final   Candida glabrata NOT DETECTED NOT DETECTED Final   Candida krusei NOT DETECTED NOT DETECTED Final   Candida parapsilosis NOT DETECTED NOT DETECTED Final   Candida tropicalis NOT DETECTED NOT DETECTED Final   Cryptococcus neoformans/gattii NOT DETECTED NOT DETECTED Final   Meth resistant mecA/C and MREJ DETECTED (A) NOT DETECTED Final    Comment: CRITICAL RESULT CALLED TO, READ BACK BY AND VERIFIED WITH:  MIRANDA VBYRK PHARM D 08/15/2024 BY DD @ [phone] Performed at [facility], [address] [address] [place], [place] [address]   Culture, blood (routine x 2)     Status: Abnormal   Collection Time: 08/14/24 11:14 AM  Specimen: BLOOD RIGHT WRIST  Result Value Ref Range Status   Specimen Description BLOOD RIGHT WRIST  Final   Special Requests   Final    BOTTLES DRAWN AEROBIC AND ANAEROBIC Blood Culture results may not be optimal due to an inadequate volume of blood received in culture bottles   Culture  Setup Time   Final    GRAM POSITIVE COCCI IN BOTH AEROBIC AND ANAEROBIC BOTTLES CRITICAL VALUE NOTED.  VALUE IS CONSISTENT WITH PREVIOUSLY REPORTED AND CALLED VALUE.    Culture (A)  Final    STAPHYLOCOCCUS AUREUS SUSCEPTIBILITIES PERFORMED ON PREVIOUS CULTURE WITHIN THE LAST 5 DAYS. Performed at [facility], [address] [place], [place] [address]    Report Status 08/17/2024 FINAL  Final   Culture, blood (Routine X 2) w Reflex to ID Panel     Status: None (Preliminary result)   Collection Time: 08/16/24  8:06 PM   Specimen: BLOOD  Result Value Ref Range Status   Specimen Description BLOOD SITE NOT SPECIFIED  Final   Special Requests   Final    BOTTLES DRAWN AEROBIC AND ANAEROBIC Blood Culture adequate volume   Culture   Final    NO GROWTH 2 DAYS Performed at [facility], [address], [place], [place] [address]    Report Status PENDING  Incomplete  Culture, blood (Routine X 2) w Reflex to ID Panel     Status: None (Preliminary result)   Collection Time: 08/16/24  8:06 PM   Specimen: BLOOD  Result Value Ref Range Status   Specimen Description BLOOD SITE NOT SPECIFIED  Final   Special Requests   Final    BOTTLES DRAWN AEROBIC AND ANAEROBIC Blood Culture adequate volume   Culture   Final    NO GROWTH 2 DAYS Performed at [facility], [address], [place], [place] [address]    Report Status PENDING  Incomplete    RADIOLOGY STUDIES/RESULTS: No results found.    LOS: 4 days   Donalda Applebaum, MD  [facility]    To contact the attending provider between 7A-7P or the covering provider during after hours 7P-7A, please log into the web site [email or web address] and access using universal [place] password for that web site. If you do not have the password, please call the hospital operator.  08/18/2024, 10:39 AM    "

## 2024-08-19 LAB — CBC
HCT: 31 % — ABNORMAL LOW (ref 39.0–52.0)
Hemoglobin: 11 g/dL — ABNORMAL LOW (ref 13.0–17.0)
MCH: 32.9 pg (ref 26.0–34.0)
MCHC: 35.5 g/dL (ref 30.0–36.0)
MCV: 92.8 fL (ref 80.0–100.0)
Platelets: 112 K/uL — ABNORMAL LOW (ref 150–400)
RBC: 3.34 MIL/uL — ABNORMAL LOW (ref 4.22–5.81)
RDW: 13 % (ref 11.5–15.5)
WBC: 5.9 K/uL (ref 4.0–10.5)
nRBC: 0 % (ref 0.0–0.2)

## 2024-08-19 LAB — GLUCOSE, CAPILLARY: Glucose-Capillary: 146 mg/dL — ABNORMAL HIGH (ref 70–99)

## 2024-08-19 LAB — CULTURE, BLOOD (ROUTINE X 2)

## 2024-08-19 LAB — BASIC METABOLIC PANEL WITH GFR
Anion gap: 9 (ref 5–15)
BUN: 9 mg/dL (ref 8–23)
CO2: 24 mmol/L (ref 22–32)
Calcium: 8.5 mg/dL — ABNORMAL LOW (ref 8.9–10.3)
Chloride: 101 mmol/L (ref 98–111)
Creatinine, Ser: 0.69 mg/dL (ref 0.61–1.24)
GFR, Estimated: >60 mL/min
Glucose, Bld: 151 mg/dL — ABNORMAL HIGH (ref 70–99)
Potassium: 4.1 mmol/L (ref 3.5–5.1)
Sodium: 133 mmol/L — ABNORMAL LOW (ref 135–145)

## 2024-08-19 NOTE — Progress Notes (Signed)
 Patient states desire to leave hospital AMA(Against Medical Advice). MD notified of patients concern. Patient educated and encouraged to stay and resume treatment. Patient stated I need to go to the bank, I'll take the bus. At this time patient is choosing to leave AMA. Patient is alert and orientated and appears to be of sound mind. Patient is a bilateral below the knee amputation. At this time, patient does not have wheel chair present at bedside. When RN questioned the mode of transportation/ambulation, patient proceeded to ambulate on below the knee amputations. RN asked patient multiple times to stay, and that it is extremely unsafe for patient to leave hospital in current condition. MD came to bedside and asked the patient several times to stay. Charge nurse also approached patient to explain varius safety concerns. Patient still was addiment about leaving hospital against medical advice. RN removed peripheral line from patient and patient proceeded to leave hospital.

## 2024-08-19 NOTE — Discharge Summary (Signed)
 "  PATIENT DETAILS Name: Cory Palmer Age: 64 y.o. Sex: male Date of Birth: [birth year] MRN: [id number]. Admitting Physician: Marsa KATHEE Scurry, MD ERE:Amntw, Rojelio Caldron, NP  Admit Date: 08/14/2024 Discharge date: 08/19/2024  Please note-he left AMA  Recommendations for Outpatient Follow-up:  If he does present to the ED on 1/23-please consult infectious disease and give him another dose of oritavancin.  Admitted From:  Homeless  Disposition: Homeless   Discharge Condition: fair  CODE STATUS:   Code Status: Full Code   Diet recommendation:  Diet Order             Diet regular Room service appropriate? Yes with Assist; Fluid consistency: Thin  Diet effective now                    Brief Summary: Patient is a 64 y.o.  male with history of B/L BKA-homelessness-cocaine/EtOH use-urine A-fib/PE on Eliquis -was recently hospitalized from 1/11-1/12-4 sepsis-blood cultures were positive for MRSA but patient unfortunately signed out AMA-presented to the hospital on 1/14 with subjective fever/fatigue/not feeling well-and was subsequently admitted to the hospitalist service.   Significant events: 1/11-1/12>> admitted for sepsis-blood cultures positive for MRSA-patient signed out AMA 1/14>> admit to [facility] 1/19>> signed out AMA  Significant studies: 1/11>> CT head: No acute intracranial process 1/11>> CT chest/abdomen/pelvis: No acute abnormality in the chest/abdomen/pelvis 1/15>>  TTE: EF 60-65%, no obvious vegetation seen. 1/16>> CT right shoulder: No acute osseous abnormality.   Significant microbiology data: 1/14>> COVID/influenza/RSV PCR: Negative 1/11>> blood culture: MRSA 1/14>> blood culture: MRSA 1/16>> blood culture: No growth   Procedures: None   Consults: ID  Brief Hospital Course: MRSA bacteremia (prior history of MRSA bacteremia August 2025, March 2021 and March 2020) S/p 1 dose of oritavancin on 1/15-as he was starting to leave AMA He subsequently  decided to stay in the hospital-and was willing to undergo a TEE-however on 1/19-he has now again decided to leave AMA.  This MD personally warned him of the life-threatening/life disabling risk of leaving AGAINST MEDICAL ADVICE but he was already dressed and about to walk out of the door. Briefly touched base with infectious disease MD-Dr. Fleeta dam on 1/19-I have encouraged the patient to come back to the ED this coming Friday-if he does-please consult infectious disease and see if we can give him another dose of oritavancin.    History of PAD-s/p bilateral BKA Wheelchair dependent   History of UGI bleeding December 2025 (EGD showed erosive esophagitis/multiple small duodenal ulcers) Continue PPI as previous.   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   PAF Non compliant to Eliquis  (furthermore has history of recent GI bleed) See above   DM-2 (A1c 5.7on  03/24/2024) SSI Diet controlled in the outpatient setting  Bipolar disorder/depression/anxiety Managed with trazodone -and other supportive care He is noncompliant with any of his medications in the outpatient setting.   EtOH use No withdrawal symptoms currently Manage with Ativan  per [facility] protocol.   Cocaine use Claims he uses cocaine several times a month-very vague about the route he uses Managed with as needed Ativan  for withdrawal symptoms   Homelessness TOC eval-however he signed out AGAINST MEDICAL ADVICE on 1/19.  Claims that he has housing almost arranged and that he needed  to go today to the bank (even though it is a national holiday and banks are likely closed)   Noncompliance Left AMA recently  on 1/12 and again on 1/19-claiming that he needs to go to the bank today. Claims that he does not have any major medical problems and does not take any prescription medications-however has above-noted history and is supposed to be on above-noted medications. Has been counseled regarding compliance but do not think he is going to take any medications even if I prescribe it to him.   Class 1 Obesity Estimated body mass index is 33.01 kg/m as calculated from the following:   Height as of this encounter: 6' (1.829 m).   Weight as of this encounter: 110.4 kg.  Discharge Diagnoses:  Principal Problem:   MRSA bacteremia Active Problems:   History of pulmonary embolus (PE)   PAF (paroxysmal atrial fibrillation) (HCC)   Cirrhosis of liver (HCC)   Diabetes mellitus type II, non insulin  dependent (HCC)   Depressed bipolar disorder (HCC)   Hypertension   BPH (benign prostatic hyperplasia)   Below-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)   Anemia   Diabetic polyneuropathy associated with type 2 diabetes mellitus (HCC)   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HCC)   GERD (gastroesophageal reflux disease)   PVD (peripheral vascular disease)   Alcohol abuse   Cocaine abuse with cocaine-induced mood disorder (HCC)   GAD (generalized anxiety disorder)   History of traumatic subdural hematoma   Bacteremia   Discharge Instructions:  Activity:  As tolerated with Full fall precautions use walker/cane & assistance as needed     Allergies[1]   Other Procedures/Studies: CT SHOULDER RIGHT W CONTRAST Result Date: 08/16/2024 EXAM: CT RIGHT SHOULDER, WITH IV CONTRAST 08/16/2024 06:11:55 AM TECHNIQUE: Axial images were acquired through the right shoulder with IV contrast. Reformatted images were reviewed. 75 mL of iohexol  (OMNIPAQUE ) 350 MG/ML injection was administered intravenously. Automated exposure control, iterative reconstruction, and/or weight based adjustment of the mA/kV was  utilized to reduce the radiation dose to as low as reasonably achievable. COMPARISON: CT with IV contrast from 08/11/2024 and 05/24/2024. CLINICAL HISTORY: Shoulder pain. FINDINGS: BONES: There is no evidence of fractures or focal suspicious bone lesions. Subcortical cystic changes extend along the greater tuberosity likely degenerative etiology. JOINTS: No dislocation. The acromioclavicular and glenohumeral joints are intact. There is no substantial joint effusion. There is slight periarticular spurring of the acromioclavicular and glenohumeral joints, mild narrowing of the glenohumeral joint. There is no erosive arthropathy. SOFT TISSUES: The rotator cuff muscles are normal in volume. The tendons are not well evaluated with CT, but appear to be grossly intact. There are multiple enhancing venous collateral vessels in the supraclavicular fossa and the deltoid and pectoralis major muscles. LUNGS AND PLEURAL SPACES: Pleuroparenchymal scarring opacities and ground glass opacity in the right upper lobe are unchanged. There is increased small layering right pleural effusion noted since 08/11/2024. IMPRESSION: 1. No acute osseous abnormality. Mild degenerative features. 2. Multiple enhancing venous collateral vessels in the supraclavicular fossa and the deltoid and pectoralis major muscles. 3. Increased small layering right pleural effusion since 08/11/2024. Electronically signed by: Francis Quam MD 08/16/2024 06:30 AM EST RP Workstation: [id number]   ECHOCARDIOGRAM COMPLETE Result Date: 08/15/2024    ECHOCARDIOGRAM REPORT   Patient Name:   Cory Palmer Date of Exam: 08/15/2024 Medical Rec #:  [id number]     Height:       68.0 in Accession #:    [id number]  Weight:       242.7 lb Date of Birth:  [birth year]    BSA:          2.219 m Patient Age:    63 years      BP:           123/92 mmHg Patient Gender: M             HR:           75 bpm. Exam Location:  Inpatient Procedure: 2D Echo, Cardiac Doppler and Color Doppler  (Both Spectral and Color            Flow Doppler were utilized during procedure). Indications:    Bacteremia  History:        Patient has prior history of Echocardiogram examinations, most                 recent 03/26/2024. Stroke, Arrythmias:Atrial Fibrillation and                 Atrial Flutter, Signs/Symptoms:Bacteremia; Risk                 Factors:Diabetes, Hypertension and Current Smoker.  Sonographer:    Juliene Rucks Referring Phys: [id number] MARSA NOVAK MELVIN  Sonographer Comments: Patient is obese. IMPRESSIONS  1. Left ventricular ejection fraction, by estimation, is 60 to 65%. The left ventricle has normal function. The left ventricle has no regional wall motion abnormalities. The left ventricular internal cavity size was mildly dilated. There is mild left ventricular hypertrophy. Left ventricular diastolic parameters are indeterminate.  2. Right ventricular systolic function is normal. The right ventricular size is normal.  3. The mitral valve is normal in structure. No evidence of mitral valve regurgitation. No evidence of mitral stenosis.  4. The aortic valve is tricuspid. Aortic valve regurgitation is not visualized. Aortic valve sclerosis/calcification is present, without any evidence of aortic stenosis.  5. The inferior vena cava is normal in size with greater than 50% respiratory variability, suggesting right atrial pressure of 3 mmHg. FINDINGS  Left Ventricle: Left ventricular ejection fraction, by estimation, is 60 to 65%. The left ventricle has normal function. The left ventricle has no regional wall motion abnormalities. The left ventricular internal cavity size was mildly dilated. There is  mild left ventricular hypertrophy. Left ventricular diastolic parameters are indeterminate. Right Ventricle: The right ventricular size is normal. Right ventricular systolic function is normal. Left Atrium: Left atrial size was normal in size. Right Atrium: Right atrial size was normal in size. Pericardium:  There is no evidence of pericardial effusion. Mitral Valve: The mitral valve is normal in structure. Mild mitral annular calcification. No evidence of mitral valve regurgitation. No evidence of mitral valve stenosis. Tricuspid Valve: The tricuspid valve is normal in structure. Tricuspid valve regurgitation is trivial. No evidence of tricuspid stenosis. Aortic Valve: The aortic valve is tricuspid. Aortic valve regurgitation is not visualized. Aortic valve sclerosis/calcification is present, without any evidence of aortic stenosis. Pulmonic Valve: The pulmonic valve was not well visualized. Pulmonic valve regurgitation is not visualized. No evidence of pulmonic stenosis. Aorta: The aortic root is normal in size and structure. Venous: The inferior vena cava is normal in size with greater than 50% respiratory variability, suggesting right atrial pressure of 3 mmHg. IAS/Shunts: The interatrial septum was not well visualized.  LEFT VENTRICLE PLAX 2D LVIDd:         5.60 cm   Diastology LVIDs:         4.30 cm  LV e' medial:    11.20 cm/s LV PW:         1.40 cm   LV E/e' medial:  12.0 LV IVS:        1.20 cm   LV e' lateral:   12.50 cm/s LVOT diam:     2.20 cm   LV E/e' lateral: 10.7 LV SV:         78 LV SV Index:   35 LVOT Area:     3.80 cm  RIGHT VENTRICLE             IVC RV Basal diam:  4.40 cm     IVC diam: 1.70 cm RV Mid diam:    3.80 cm RV S prime:     16.40 cm/s TAPSE (M-mode): 2.3 cm LEFT ATRIUM           Index LA diam:      5.20 cm 2.34 cm/m LA Vol (A2C): 85.3 ml 38.44 ml/m LA Vol (A4C): 81.7 ml 36.82 ml/m  AORTIC VALVE LVOT Vmax:   127.00 cm/s LVOT Vmean:  86.500 cm/s LVOT VTI:    0.206 m  AORTA Ao Root diam: 3.50 cm MITRAL VALVE                TRICUSPID VALVE MV Area (PHT): 3.72 cm     TR Peak grad:   25.4 mmHg MV Decel Time: 204 msec     TR Vmax:        252.00 cm/s MV E velocity: 134.00 cm/s MV A velocity: 33.50 cm/s   SHUNTS MV E/A ratio:  4.00         Systemic VTI:  0.21 m                              Systemic Diam: 2.20 cm Redell Shallow MD Electronically signed by Redell Shallow MD Signature Date/Time: 08/15/2024/2:40:02 PM    Final    DG Chest 1 View Result Date: 08/14/2024 EXAM: 1 VIEW(S) XRAY OF THE CHEST 08/14/2024 11:59:00 AM COMPARISON: 08/11/2024 CLINICAL HISTORY: The patient presents with fever and cough. FINDINGS: LUNGS AND PLEURA: Low lung volumes with bronchovascular crowding. Mild diffuse interstitial opacities. Elevated right hemidiaphragm more pronounced than on the previous study. No pleural effusion. No pneumothorax. HEART AND MEDIASTINUM: No acute abnormality of the cardiac and mediastinal silhouettes. BONES AND SOFT TISSUES: No acute osseous abnormality. IMPRESSION: 1. Low lung volumes with bronchovascular crowding and mild diffuse interstitial opacities. 2. Elevated right hemidiaphragm, more pronounced than on the previous study. Electronically signed by: Evalene Coho MD 08/14/2024 12:42 PM EST RP Workstation: [id number]   CT CHEST ABDOMEN PELVIS W CONTRAST Result Date: 08/12/2024 EXAM: CT CHEST, ABDOMEN AND PELVIS WITH CONTRAST 08/12/2024 12:03:40 AM TECHNIQUE: CT of the chest, abdomen and pelvis was performed with the administration of 100 mL of iohexol  (OMNIPAQUE ) 300 MG/ML solution. Multiplanar reformatted images are provided for review. Automated exposure control, iterative reconstruction, and/or weight based adjustment of the mA/kV was utilized to reduce the radiation dose to as low as reasonably achievable. COMPARISON: CT abdomen/pelvis dated 08/11/2024 and CT chest dated 07/08/2024. CLINICAL HISTORY: Sepsis. FINDINGS: CHEST: MEDIASTINUM AND LYMPH NODES: Heart and pericardium are unremarkable. The central airways are clear. No mediastinal, hilar or axillary lymphadenopathy. Mild 3-vessel coronary atherosclerosis. LUNGS AND PLEURA: Stable subpleural scarring in the right upper lobe, favored to be postinfectious/inflammatory. Mild dependent atelectasis in the bilateral lower  lobes. No focal consolidation or pulmonary  edema. No pleural effusion. No pneumothorax. ABDOMEN AND PELVIS: LIVER: Cirrhosis. Portal vein is patent. GALLBLADDER AND BILE DUCTS: Unremarkable. No biliary ductal dilatation. SPLEEN: Splenomegaly. PANCREAS: No acute abnormality. ADRENAL GLANDS: No acute abnormality. KIDNEYS, URETERS AND BLADDER: 2.9 cm simple right upper pole renal cyst, benign. Per consensus, no follow-up is needed for simple Bosniak type 1 and 2 renal cysts, unless the patient has a malignancy history or risk factors. No stones in the kidneys or ureters. No hydronephrosis. No perinephric or periureteral stranding. Urinary bladder is unremarkable. GI AND BOWEL: Stomach demonstrates no acute abnormality. There is no bowel obstruction. REPRODUCTIVE ORGANS: The prostate is unremarkable. PERITONEUM AND RETROPERITONEUM: No ascites. No free air. VASCULATURE: Atherosclerotic calcifications of the abdominal aorta and branch vessels, although patent. Aorta is normal in caliber. ABDOMINAL AND PELVIS LYMPH NODES: No lymphadenopathy. BONES AND SOFT TISSUES: Avascular necrosis of the bilateral femoral heads, without collapse. Grade 1 spondylolisthesis of L4 on L5. Mild degenerative changes have been visualized throughout the lumbar spine. Tiny fat-containing left inguinal hernia. IMPRESSION: 1. No acute abnormality in the chest, abdomen, or pelvis. 2. Stable ancillary findings, as above. Electronically signed by: Pinkie Pebbles MD MD 08/12/2024 12:08 AM EST RP Workstation: [id number]   CT Head Wo Contrast Result Date: 08/11/2024 CLINICAL DATA:  Memory loss EXAM: CT HEAD WITHOUT CONTRAST TECHNIQUE: Contiguous axial images were obtained from the base of the skull through the vertex without intravenous contrast. RADIATION DOSE REDUCTION: This exam was performed according to the departmental dose-optimization program which includes automated exposure control, adjustment of the mA and/or kV according to patient size  and/or use of iterative reconstruction technique. COMPARISON:  03/25/2024 FINDINGS: Brain: No acute infarct or hemorrhage. Lateral ventricles and midline structures are unremarkable. None no acute extra-axial fluid collections. No mass effect. Vascular: No hyperdense vessel or unexpected calcification. Skull: Normal. Negative for fracture or focal lesion. Sinuses/Orbits: Mucoperiosteal thickening within the left frontal sinus, bilateral ethmoid air cells, and right maxillary sinus. Other: None. IMPRESSION: 1. No acute intracranial process. 2. Frontal, ethmoid, and maxillary sinus disease as above. Electronically Signed   By: Ozell Daring M.D.   On: 08/11/2024 18:58   DG Chest Port 1 View Result Date: 08/11/2024 CLINICAL DATA:  Possible sepsis.  Shortness of breath. EXAM: PORTABLE CHEST 1 VIEW COMPARISON:  03/20/2024, 06/01/2024. FINDINGS: The heart is enlarged and the mediastinal contour is within normal limits. Lung volumes are low. No consolidation, effusion, or pneumothorax is seen. No acute osseous abnormality. IMPRESSION: Low lung volumes with no active disease. Electronically Signed   By: Leita Birmingham M.D.   On: 08/11/2024 16:22   CT ABDOMEN PELVIS WO CONTRAST Result Date: 08/11/2024 CLINICAL DATA:  Acute abdominal pain.  Cirrhosis. EXAM: CT ABDOMEN AND PELVIS WITHOUT CONTRAST TECHNIQUE: Multidetector CT imaging of the abdomen and pelvis was performed following the standard protocol without IV contrast. RADIATION DOSE REDUCTION: This exam was performed according to the departmental dose-optimization program which includes automated exposure control, adjustment of the mA and/or kV according to patient size and/or use of iterative reconstruction technique. COMPARISON:  07/08/2024 FINDINGS: Lower chest: No acute findings. Hepatobiliary: Cirrhosis is again demonstrated. No mass visualized on this unenhanced exam. Recanalization of paraumbilical veins again seen, consistent with portal venous hypertension.  No evidence of ascites. Gallbladder is unremarkable. No evidence of biliary ductal dilatation. Pancreas: No mass or inflammatory process visualized on this unenhanced exam. Spleen: Stable mild splenomegaly, consistent with portal venous hypertension. Adrenals/Urinary tract: No evidence of urolithiasis or hydronephrosis. Unremarkable unopacified urinary  bladder. Stomach/Bowel: Limited by motion artifact. No evidence of obstruction, inflammatory process, or abnormal fluid collections. Vascular/Lymphatic: No pathologically enlarged lymph nodes identified. No evidence of abdominal aortic aneurysm. Abdominal portosystemic venous collaterals and esophageal varices again seen, consistent with portal venous hypertension. Reproductive:  No mass or other significant abnormality. Other:  None. Musculoskeletal: No suspicious bone lesions identified. 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 Bilateral L4 pars defects again seen, with degenerative disc disease and 9 mm anterolisthesis at L4-5. IMPRESSION: No acute findings. Cirrhosis and findings of portal venous hypertension. Electronically Signed   By: Norleen DELENA Kil M.D.   On: 08/11/2024 16:21     TODAY-DAY OF DISCHARGE:  Subjective:   Cory Palmer today has no headache,no chest abdominal pain,no new weakness tingling or numbness, feels much better wants to go home today.   Objective:   Blood pressure 119/74, pulse 73, temperature 97.9 F (36.6 C), temperature source Oral, resp. rate 19, height 6' (1.829 m), weight 110.4 kg, SpO2 94%.  Intake/Output Summary (Last 24 hours) at 08/19/2024 1139 Last data filed at 08/19/2024 0352 Gross per 24 hour  Intake 3 ml  Output 1500 ml  Net -1497 ml   Filed Weights   08/15/24 1709  Weight: 110.4 kg    Exam: Awake Alert, Oriented *3, No new F.N deficits, Normal affect [place].AT,PERRAL Supple Neck,No JVD, No cervical lymphadenopathy appriciated.  Symmetrical Chest wall movement, Good air movement  bilaterally, CTAB RRR,No Gallops,Rubs or new Murmurs, No Parasternal Heave +ve B.Sounds, Abd Soft, Non tender, No organomegaly appriciated, No rebound -guarding or rigidity. No Cyanosis, Clubbing or edema, No new Rash or bruise   PERTINENT RADIOLOGIC STUDIES: No results found.   PERTINENT LAB RESULTS: CBC: Recent Labs    08/17/24 0648 08/19/24 0258  WBC 5.0 5.9  HGB 11.1* 11.0*  HCT 30.6* 31.0*  PLT 67* 112*   CMET CMP     Component Value Date/Time   NA 133 (L) 08/19/2024 0258   NA 138 05/16/2022 1003   K 4.1 08/19/2024 0258   CL 101 08/19/2024 0258   CO2 24 08/19/2024 0258   GLUCOSE 151 (H) 08/19/2024 0258   BUN 9 08/19/2024 0258   BUN 14 05/16/2022 1003   CREATININE 0.69 08/19/2024 0258   CREATININE 0.78 08/15/2019 1135   CALCIUM  8.5 (L) 08/19/2024 0258   PROT 5.5 (L) 08/15/2024 0144   ALBUMIN 2.9 (L) 08/15/2024 0144   AST 32 08/15/2024 0144   ALT 16 08/15/2024 0144   ALKPHOS 88 08/15/2024 0144   BILITOT 1.8 (H) 08/15/2024 0144   EGFR 106 05/16/2022 1003   GFRNONAA >60 08/19/2024 0258    GFR Estimated Creatinine Clearance: 119.7 mL/min (by C-G formula based on SCr of 0.69 mg/dL). No results for input(s): LIPASE, AMYLASE in the last 72 hours. No results for input(s): CKTOTAL, CKMB, CKMBINDEX, TROPONINI in the last 72 hours. Invalid input(s): POCBNP No results for input(s): DDIMER in the last 72 hours. No results for input(s): HGBA1C in the last 72 hours. No results for input(s): CHOL, HDL, LDLCALC, TRIG, CHOLHDL, LDLDIRECT in the last 72 hours. No results for input(s): TSH, T4TOTAL, T3FREE, THYROIDAB in the last 72 hours.  Invalid input(s): FREET3 No results for input(s): VITAMINB12, FOLATE, FERRITIN, TIBC, IRON, RETICCTPCT in the last 72 hours. Coags: No results for input(s): INR in the last 72 hours.  Invalid input(s): PT Microbiology: Recent Results (from the past 240 hours)  Resp panel by  RT-PCR (RSV, Flu A&B, Covid) Anterior Nasal Swab  Status: None   Collection Time: 08/11/24  3:50 PM   Specimen: Anterior Nasal Swab  Result Value Ref Range Status   SARS Coronavirus 2 by RT PCR NEGATIVE NEGATIVE Final    Comment: (NOTE) SARS-CoV-2 target nucleic acids are NOT DETECTED.  The SARS-CoV-2 RNA is generally detectable in upper respiratory specimens during the acute phase of infection. The lowest concentration of SARS-CoV-2 viral copies this assay can detect is 138 copies/mL. A negative result does not preclude SARS-Cov-2 infection and should not be used as the sole basis for treatment or other patient management decisions. A negative result may occur with  improper specimen collection/handling, submission of specimen other than nasopharyngeal swab, presence of viral mutation(s) within the areas targeted by this assay, and inadequate number of viral copies(<138 copies/mL). A negative result must be combined with clinical observations, patient history, and epidemiological information. The expected result is Negative.  Fact Sheet for Patients:  bloggercourse.com  Fact Sheet for Healthcare Providers:  seriousbroker.it  This test is no t yet approved or cleared by the [place]  FDA and  has been authorized for detection and/or diagnosis of SARS-CoV-2 by FDA under an Emergency Use Authorization (EUA). This EUA will remain  in effect (meaning this test can be used) for the duration of the COVID-19 declaration under Section 564(b)(1) of the Act, 21 U.S.C.section 360bbb-3(b)(1), unless the authorization is terminated  or revoked sooner.       Influenza A by PCR NEGATIVE NEGATIVE Final   Influenza B by PCR NEGATIVE NEGATIVE Final    Comment: (NOTE) The Xpert Xpress SARS-CoV-2/FLU/RSV plus assay is intended as an aid in the diagnosis of influenza from Nasopharyngeal swab specimens and should not be used as a sole basis  for treatment. Nasal washings and aspirates are unacceptable for Xpert Xpress SARS-CoV-2/FLU/RSV testing.  Fact Sheet for Patients: bloggercourse.com  Fact Sheet for Healthcare Providers: seriousbroker.it  This test is not yet approved or cleared by the [place]  FDA and has been authorized for detection and/or diagnosis of SARS-CoV-2 by FDA under an Emergency Use Authorization (EUA). This EUA will remain in effect (meaning this test can be used) for the duration of the COVID-19 declaration under Section 564(b)(1) of the Act, 21 U.S.C. section 360bbb-3(b)(1), unless the authorization is terminated or revoked.     Resp Syncytial Virus by PCR NEGATIVE NEGATIVE Final    Comment: (NOTE) Fact Sheet for Patients: bloggercourse.com  Fact Sheet for Healthcare Providers: seriousbroker.it  This test is not yet approved or cleared by the [place]  FDA and has been authorized for detection and/or diagnosis of SARS-CoV-2 by FDA under an Emergency Use Authorization (EUA). This EUA will remain in effect (meaning this test can be used) for the duration of the COVID-19 declaration under Section 564(b)(1) of the Act, 21 U.S.C. section 360bbb-3(b)(1), unless the authorization is terminated or revoked.  Performed at [facility], [address], [place], [place] [address]   Blood Culture (routine x 2)     Status: Abnormal (Preliminary result)   Collection Time: 08/11/24  5:02 PM   Specimen: BLOOD RIGHT ARM  Result Value Ref Range Status   Specimen Description   Final    BLOOD RIGHT ARM Performed at [facility], [address] [place], [place] [address]    Special Requests   Final    BOTTLES DRAWN AEROBIC AND ANAEROBIC Blood Culture results may not be optimal due to an inadequate volume of blood received in culture bottles Performed at  [facility], [address] [place], [place] [address]    Culture  Setup Time   Final    GRAM POSITIVE COCCI IN BOTH AEROBIC AND ANAEROBIC BOTTLES CRITICAL RESULT CALLED TO, READ BACK BY AND VERIFIED WITH: N. GLOGOVAC PHARMD, AT 9081 08/12/24 D. JEARLEAN    Culture (A)  Final    METHICILLIN RESISTANT STAPHYLOCOCCUS AUREUS Sent to [facility] for further susceptibility testing. Performed at [facility], [address] [place], [place] [address]    Report Status PENDING  Incomplete   Organism ID, Bacteria METHICILLIN RESISTANT STAPHYLOCOCCUS AUREUS  Final      Susceptibility   Methicillin resistant staphylococcus aureus - MIC*    CIPROFLOXACIN  >=8 RESISTANT Resistant     ERYTHROMYCIN >=8 RESISTANT Resistant     GENTAMICIN <=0.5 SENSITIVE Sensitive     OXACILLIN >=4 RESISTANT Resistant     TETRACYCLINE >=16 RESISTANT Resistant     VANCOMYCIN  <=0.5 SENSITIVE Sensitive     TRIMETH /SULFA  >=320 RESISTANT Resistant     CLINDAMYCIN  <=0.25 SENSITIVE Sensitive     RIFAMPIN <=0.5 SENSITIVE Sensitive     Inducible Clindamycin  NEGATIVE Sensitive     LINEZOLID 2 SENSITIVE Sensitive     * METHICILLIN RESISTANT STAPHYLOCOCCUS AUREUS  Blood Culture ID Panel (Reflexed)     Status: Abnormal   Collection Time: 08/11/24  5:02 PM  Result Value Ref Range Status   Enterococcus faecalis NOT DETECTED NOT DETECTED Final   Enterococcus Faecium NOT DETECTED NOT DETECTED Final   Listeria monocytogenes NOT DETECTED NOT DETECTED Final   Staphylococcus species DETECTED (A) NOT DETECTED Final    Comment: CRITICAL RESULT CALLED TO, READ BACK BY AND VERIFIED WITH: N. GLOGOVAC PHARMD, AT 9081 08/12/24 D. VANHOOK    Staphylococcus aureus (BCID) DETECTED (A) NOT DETECTED Final    Comment: Methicillin (oxacillin)-resistant Staphylococcus aureus (MRSA). MRSA is predictably resistant to beta-lactam antibiotics (except ceftaroline). Preferred therapy is vancomycin  unless clinically contraindicated. Patient requires  contact precautions if  hospitalized. CRITICAL RESULT CALLED TO, READ BACK BY AND VERIFIED WITHBETHA GEANNIE ROLLER PHARMD, AT 9081 08/12/24 D. VANHOOK    Staphylococcus epidermidis NOT DETECTED NOT DETECTED Final   Staphylococcus lugdunensis NOT DETECTED NOT DETECTED Final   Streptococcus species NOT DETECTED NOT DETECTED Final   Streptococcus agalactiae NOT DETECTED NOT DETECTED Final   Streptococcus pneumoniae NOT DETECTED NOT DETECTED Final   Streptococcus pyogenes NOT DETECTED NOT DETECTED Final   A.calcoaceticus-baumannii NOT DETECTED NOT DETECTED Final   Bacteroides fragilis NOT DETECTED NOT DETECTED Final   Enterobacterales NOT DETECTED NOT DETECTED Final   Enterobacter cloacae complex NOT DETECTED NOT DETECTED Final   Escherichia coli NOT DETECTED NOT DETECTED Final   Klebsiella aerogenes NOT DETECTED NOT DETECTED Final   Klebsiella oxytoca NOT DETECTED NOT DETECTED Final   Klebsiella pneumoniae NOT DETECTED NOT DETECTED Final   Proteus species NOT DETECTED NOT DETECTED Final   Salmonella species NOT DETECTED NOT DETECTED Final   Serratia marcescens NOT DETECTED NOT DETECTED Final   Haemophilus influenzae NOT DETECTED NOT DETECTED Final   Neisseria meningitidis NOT DETECTED NOT DETECTED Final   Pseudomonas aeruginosa NOT DETECTED NOT DETECTED Final   Stenotrophomonas maltophilia NOT DETECTED NOT DETECTED Final   Candida albicans NOT DETECTED NOT DETECTED Final   Candida auris NOT DETECTED NOT DETECTED Final   Candida glabrata NOT DETECTED NOT DETECTED Final   Candida krusei NOT DETECTED NOT DETECTED Final   Candida parapsilosis NOT DETECTED NOT DETECTED Final   Candida tropicalis NOT  DETECTED NOT DETECTED Final   Cryptococcus neoformans/gattii NOT DETECTED NOT DETECTED Final   Meth resistant mecA/C and MREJ DETECTED (A) NOT DETECTED Final    Comment: CRITICAL RESULT CALLED TO, READ BACK BY AND VERIFIED WITH: N. GLOGOVAC PHARMD, AT 9081 08/12/24 CHARM CARMIN Performed at [facility], [address], [place], [place] [address]   MIC (1 Drug)-     Status: None   Collection Time: 08/11/24  5:02 PM  Result Value Ref Range Status   Min Inhibitory Conc (1 Drug) BLOOD  Final   Source [id number] mrsa dapto blood  Corrected    Comment: Performed at [facility], [address], [place], [place] [address] CORRECTED ON 01/15 AT 1315: PREVIOUSLY REPORTED AS BLOOD/ MRSA/ DAPTOMYCIN    MIC (1 Drug)-     Status: Abnormal   Collection Time: 08/11/24  5:02 PM  Result Value Ref Range Status   Min Inhibitory Conc (1 Drug) Final report (A)  Corrected    Comment: (NOTE) Performed At: [facility] [address] [place], [place] [id number] Jennette Shorter MD Ey:[id number] CORRECTED ON 01/18 AT 1735: PREVIOUSLY REPORTED AS Preliminary report    Source MRSA/DAPTOMYCIN / BLOOD  Final    Comment: Performed at [facility], [address] [place], [place] [address]  MIC Result     Status: Abnormal   Collection Time: 08/11/24  5:02 PM  Result Value Ref Range Status   Result 1 (MIC) Comment (A)  Final    Comment: (NOTE) Methicillin - resistant Staphylococcus aureus Identification performed by account, not confirmed by this laboratory. DAPTOMYCIN  <=0.25ug/ml SUSCEPTIBLE Performed At: [facility] [address] [place], [place] [id number] Jennette Shorter MD Ey:[id number]   Respiratory (~20 pathogens) panel by PCR     Status: None   Collection Time: 08/12/24  3:30 AM   Specimen: Nasopharyngeal Swab; Respiratory  Result Value Ref Range Status   Adenovirus NOT DETECTED NOT DETECTED Final   Coronavirus 229E NOT DETECTED NOT DETECTED Final    Comment: (NOTE) The Coronavirus on the Respiratory Panel, DOES NOT test for the novel  Coronavirus (2019 nCoV)    Coronavirus HKU1 NOT DETECTED NOT DETECTED Final   Coronavirus NL63 NOT DETECTED NOT DETECTED Final   Coronavirus OC43 NOT DETECTED NOT DETECTED Final   Metapneumovirus NOT DETECTED NOT DETECTED Final    Rhinovirus / Enterovirus NOT DETECTED NOT DETECTED Final   Influenza A NOT DETECTED NOT DETECTED Final   Influenza B NOT DETECTED NOT DETECTED Final   Parainfluenza Virus 1 NOT DETECTED NOT DETECTED Final   Parainfluenza Virus 2 NOT DETECTED NOT DETECTED Final   Parainfluenza Virus 3 NOT DETECTED NOT DETECTED Final   Parainfluenza Virus 4 NOT DETECTED NOT DETECTED Final   Respiratory Syncytial Virus NOT DETECTED NOT DETECTED Final   Bordetella pertussis NOT DETECTED NOT DETECTED Final   Bordetella Parapertussis NOT DETECTED NOT DETECTED Final   Chlamydophila pneumoniae NOT DETECTED NOT DETECTED Final   Mycoplasma pneumoniae NOT DETECTED NOT DETECTED Final    Comment: Performed at [facility], [address], [place], [place] [address]  Culture, blood (routine x 2)     Status: Abnormal (Preliminary result)   Collection Time: 08/14/24 11:09 AM   Specimen: BLOOD LEFT HAND  Result Value Ref Range Status   Specimen Description BLOOD LEFT HAND  Final   Special Requests   Final    BOTTLES DRAWN AEROBIC AND ANAEROBIC Blood Culture results may not be optimal due to  an inadequate volume of blood received in culture bottles   Culture  Setup Time   Final    GRAM POSITIVE COCCI IN BOTH AEROBIC AND ANAEROBIC BOTTLES CRITICAL RESULT CALLED TO, READ BACK BY AND VERIFIED WITH:  MIRANDA VBYRK PHARM D 08/15/2024 BY DD @ [phone]    Culture (A)  Final    METHICILLIN RESISTANT STAPHYLOCOCCUS AUREUS Sent to [facility] for further susceptibility testing. Performed at [facility], [address] [place] [address], [place], [place] [address]    Report Status PENDING  Incomplete   Organism ID, Bacteria METHICILLIN RESISTANT STAPHYLOCOCCUS AUREUS  Final      Susceptibility   Methicillin resistant staphylococcus aureus - MIC*    CIPROFLOXACIN  >=8 RESISTANT Resistant     ERYTHROMYCIN >=8 RESISTANT Resistant     GENTAMICIN <=0.5 SENSITIVE Sensitive     OXACILLIN >=4 RESISTANT Resistant     TETRACYCLINE >=16  RESISTANT Resistant     VANCOMYCIN  1 SENSITIVE Sensitive     TRIMETH /SULFA  >=320 RESISTANT Resistant     CLINDAMYCIN  <=0.25 SENSITIVE Sensitive     RIFAMPIN <=0.5 SENSITIVE Sensitive     Inducible Clindamycin  NEGATIVE Sensitive     LINEZOLID 2 SENSITIVE Sensitive     * METHICILLIN RESISTANT STAPHYLOCOCCUS AUREUS  Resp panel by RT-PCR (RSV, Flu A&B, Covid)     Status: None   Collection Time: 08/14/24 11:09 AM   Specimen: Nasal Swab  Result Value Ref Range Status   SARS Coronavirus 2 by RT PCR NEGATIVE NEGATIVE Final   Influenza A by PCR NEGATIVE NEGATIVE Final   Influenza B by PCR NEGATIVE NEGATIVE Final    Comment: (NOTE) The Xpert Xpress SARS-CoV-2/FLU/RSV plus assay is intended as an aid in the diagnosis of influenza from Nasopharyngeal swab specimens and should not be used as a sole basis for treatment. Nasal washings and aspirates are unacceptable for Xpert Xpress SARS-CoV-2/FLU/RSV testing.  Fact Sheet for Patients: bloggercourse.com  Fact Sheet for Healthcare Providers: seriousbroker.it  This test is not yet approved or cleared by the [place]  FDA and has been authorized for detection and/or diagnosis of SARS-CoV-2 by FDA under an Emergency Use Authorization (EUA). This EUA will remain in effect (meaning this test can be used) for the duration of the COVID-19 declaration under Section 564(b)(1) of the Act, 21 U.S.C. section 360bbb-3(b)(1), unless the authorization is terminated or revoked.     Resp Syncytial Virus by PCR NEGATIVE NEGATIVE Final    Comment: (NOTE) Fact Sheet for Patients: bloggercourse.com  Fact Sheet for Healthcare Providers: seriousbroker.it  This test is not yet approved or cleared by the [place]  FDA and has been authorized for detection and/or diagnosis of SARS-CoV-2 by FDA under an Emergency Use Authorization (EUA). This EUA will  remain in effect (meaning this test can be used) for the duration of the COVID-19 declaration under Section 564(b)(1) of the Act, 21 U.S.C. section 360bbb-3(b)(1), unless the authorization is terminated or revoked.  Performed at [facility], [address] [place], [place] [address]   Blood Culture ID Panel (Reflexed)     Status: Abnormal   Collection Time: 08/14/24 11:09 AM  Result Value Ref Range Status   Enterococcus faecalis NOT DETECTED NOT DETECTED Final   Enterococcus Faecium NOT DETECTED NOT DETECTED Final   Listeria monocytogenes NOT DETECTED NOT DETECTED Final   Staphylococcus species DETECTED (A) NOT DETECTED Final    Comment: CRITICAL RESULT CALLED TO, READ BACK BY AND VERIFIED WITH:  MIRANDA VBYRK PHARM D 08/15/2024 BY DD @  [phone]    Staphylococcus aureus (BCID) DETECTED (A) NOT DETECTED Final    Comment: Methicillin (oxacillin)-resistant Staphylococcus aureus (MRSA). MRSA is predictably resistant to beta-lactam antibiotics (except ceftaroline). Preferred therapy is vancomycin  unless clinically contraindicated. Patient requires contact precautions if  hospitalized. CRITICAL RESULT CALLED TO, READ BACK BY AND VERIFIED WITH:  MIRANDA VBYRK PHARM D 08/15/2024 BY DD @ 0512    Staphylococcus epidermidis NOT DETECTED NOT DETECTED Final   Staphylococcus lugdunensis NOT DETECTED NOT DETECTED Final   Streptococcus species NOT DETECTED NOT DETECTED Final   Streptococcus agalactiae NOT DETECTED NOT DETECTED Final   Streptococcus pneumoniae NOT DETECTED NOT DETECTED Final   Streptococcus pyogenes NOT DETECTED NOT DETECTED Final   A.calcoaceticus-baumannii NOT DETECTED NOT DETECTED Final   Bacteroides fragilis NOT DETECTED NOT DETECTED Final   Enterobacterales NOT DETECTED NOT DETECTED Final   Enterobacter cloacae complex NOT DETECTED NOT DETECTED Final   Escherichia coli NOT DETECTED NOT DETECTED Final   Klebsiella aerogenes NOT DETECTED NOT DETECTED Final   Klebsiella  oxytoca NOT DETECTED NOT DETECTED Final   Klebsiella pneumoniae NOT DETECTED NOT DETECTED Final   Proteus species NOT DETECTED NOT DETECTED Final   Salmonella species NOT DETECTED NOT DETECTED Final   Serratia marcescens NOT DETECTED NOT DETECTED Final   Haemophilus influenzae NOT DETECTED NOT DETECTED Final   Neisseria meningitidis NOT DETECTED NOT DETECTED Final   Pseudomonas aeruginosa NOT DETECTED NOT DETECTED Final   Stenotrophomonas maltophilia NOT DETECTED NOT DETECTED Final   Candida albicans NOT DETECTED NOT DETECTED Final   Candida auris NOT DETECTED NOT DETECTED Final   Candida glabrata NOT DETECTED NOT DETECTED Final   Candida krusei NOT DETECTED NOT DETECTED Final   Candida parapsilosis NOT DETECTED NOT DETECTED Final   Candida tropicalis NOT DETECTED NOT DETECTED Final   Cryptococcus neoformans/gattii NOT DETECTED NOT DETECTED Final   Meth resistant mecA/C and MREJ DETECTED (A) NOT DETECTED Final    Comment: CRITICAL RESULT CALLED TO, READ BACK BY AND VERIFIED WITH:  MIRANDA VBYRK PHARM D 08/15/2024 BY DD @ [phone] Performed at [facility], [address], [place], [place] [address]   MIC (1 Drug)-     Status: None (Preliminary result)   Collection Time: 08/14/24 11:09 AM  Result Value Ref Range Status   Min Inhibitory Conc (1 Drug) PENDING  Incomplete   Source [id number] MRSA DAPTO BLOOD  Final    Comment: Performed at [facility], [address] [place], [place] [address]  Culture, blood (routine x 2)     Status: Abnormal   Collection Time: 08/14/24 11:14 AM   Specimen: BLOOD RIGHT WRIST  Result Value Ref Range Status   Specimen Description BLOOD RIGHT WRIST  Final   Special Requests   Final    BOTTLES DRAWN AEROBIC AND ANAEROBIC Blood Culture results may not be optimal due to an inadequate volume of blood received in culture bottles   Culture  Setup Time   Final    GRAM POSITIVE COCCI IN BOTH AEROBIC AND ANAEROBIC BOTTLES CRITICAL VALUE NOTED.  VALUE IS  CONSISTENT WITH PREVIOUSLY REPORTED AND CALLED VALUE.    Culture (A)  Final    STAPHYLOCOCCUS AUREUS SUSCEPTIBILITIES PERFORMED ON PREVIOUS CULTURE WITHIN THE LAST 5 DAYS. Performed at [facility], [address] [place], [place] [address]    Report Status 08/17/2024 FINAL  Final  Culture, blood (Routine X 2) w Reflex to ID Panel     Status: None (Preliminary result)  Collection Time: 08/16/24  8:06 PM   Specimen: BLOOD  Result Value Ref Range Status   Specimen Description BLOOD SITE NOT SPECIFIED  Final   Special Requests   Final    BOTTLES DRAWN AEROBIC AND ANAEROBIC Blood Culture adequate volume   Culture   Final    NO GROWTH 3 DAYS Performed at [facility], [address], [place], [place] [address]    Report Status PENDING  Incomplete  Culture, blood (Routine X 2) w Reflex to ID Panel     Status: None (Preliminary result)   Collection Time: 08/16/24  8:06 PM   Specimen: BLOOD  Result Value Ref Range Status   Specimen Description BLOOD SITE NOT SPECIFIED  Final   Special Requests   Final    BOTTLES DRAWN AEROBIC AND ANAEROBIC Blood Culture adequate volume   Culture   Final    NO GROWTH 3 DAYS Performed at [facility], [address], [place], [place] [address]    Report Status PENDING  Incomplete    FURTHER DISCHARGE INSTRUCTIONS:  Get Medicines reviewed and adjusted: Please take all your medications with you for your next visit with your Primary MD  Laboratory/radiological data: Please request your Primary MD to go over all hospital tests and procedure/radiological results at the follow up, please ask your Primary MD to get all Hospital records sent to his/her office.  In some cases, they will be blood work, cultures and biopsy results pending at the time of your discharge. Please request that your primary care M.D. goes through all the records of your hospital data and follows up on these results.  Also Note the following: If you experience  worsening of your admission symptoms, develop shortness of breath, life threatening emergency, suicidal or homicidal thoughts you must seek medical attention immediately by calling 911 or calling your MD immediately  if symptoms less severe.  You must read complete instructions/literature along with all the possible adverse reactions/side effects for all the Medicines you take and that have been prescribed to you. Take any new Medicines after you have completely understood and accpet all the possible adverse reactions/side effects.   Do not drive when taking Pain medications or sleeping medications (Benzodaizepines)  Do not take more than prescribed Pain, Sleep and Anxiety Medications. It is not advisable to combine anxiety,sleep and pain medications without talking with your primary care practitioner  Special Instructions: If you have smoked or chewed Tobacco  in the last 2 yrs please stop smoking, stop any regular Alcohol  and or any Recreational drug use.  Wear Seat belts while driving.  Please note: You were cared for by a hospitalist during your hospital stay. Once you are discharged, your primary care physician will handle any further medical issues. Please note that NO REFILLS for any discharge medications will be authorized once you are discharged, as it is imperative that you return to your primary care physician (or establish a relationship with a primary care physician if you do not have one) for your post hospital discharge needs so that they can reassess your need for medications and monitor your lab values.  Total Time spent coordinating discharge including counseling, education and face to face time equals greater than 30 minutes.  Signed: Donalda Applebaum 08/19/2024 11:39 AM      [1]  Allergies Allergen Reactions   Atorvastatin Calcium  Nausea And Vomiting   Gemfibrozil Other (See Comments)    Pt cannot recall symptoms   "

## 2024-08-19 NOTE — Plan of Care (Signed)
  Problem: Clinical Measurements: Goal: Ability to maintain clinical measurements within normal limits will improve Outcome: Progressing Goal: Will remain free from infection Outcome: Progressing   Problem: Activity: Goal: Risk for activity intolerance will decrease Outcome: Progressing   Problem: Pain Managment: Goal: General experience of comfort will improve and/or be controlled Outcome: Progressing   Problem: Safety: Goal: Ability to remain free from injury will improve Outcome: Progressing

## 2024-08-19 NOTE — TOC Transition Note (Signed)
 Transition of Care [facility]) - Discharge Note   Patient Details  Name: Cory Palmer MRN: [id number] Date of Birth: 29-Dec-1960  [facility] CM/SW Contact:  Inocente GORMAN Kindle, LCSW Phone Number: 08/19/2024, 12:03 PM   Clinical Narrative:    Patient leaving AMA. CSW spoke with him in the elevator while staff was attempting to assist him with a blanket. He reported his wheelchair is in the custody of research officer, political party but that he would be ok on the bus and still wanted to leave AMA. No further interventions able to be completed at this time.      Barriers to Discharge: Homeless with medical needs, Left AMA  Patient Goals and CMS Choice            Discharge Placement                       Discharge Plan and Services Additional resources added to the After Visit Summary for   In-house Referral: Clinical Social Work Discharge Planning Services: CM Consult                                 Social Drivers of Health (SDOH) Interventions SDOH Screenings   Food Insecurity: Food Insecurity Present (08/15/2024)  Housing: High Risk (08/15/2024)  Transportation Needs: Unmet Transportation Needs (08/15/2024)  Utilities: Not At Risk (08/15/2024)  Depression (PHQ2-9): High Risk (05/16/2022)  Financial Resource Strain: Not on File (11/18/2021)   Received from [facility]  Physical Activity: Not on File (11/18/2021)   Received from [facility]  Social Connections: Not on File (04/15/2023)   Received from [facility]  Stress: Not on File (11/18/2021)   Received from [facility]  Tobacco Use: High Risk (08/14/2024)     Readmission Risk Interventions    08/16/2024    3:31 PM  Readmission Risk Prevention Plan  Transportation Screening Complete  Medication Review (RN Care Manager) Complete  PCP or Specialist appointment within 3-5 days of discharge Complete  HRI or Home Care Consult Complete  SW Recovery Care/Counseling Consult Complete  Palliative Care Screening Not Applicable  Skilled Nursing  Facility Not Applicable

## 2024-08-20 ENCOUNTER — Other Ambulatory Visit: Payer: Self-pay | Admitting: *Deleted

## 2024-08-20 NOTE — Progress Notes (Signed)
 ED of the [facility] called to get assistance for the patient as he was discharged without hi WC. He is a bilat BKA. CCN Nyla) was able to get him a loaner WC

## 2024-08-21 LAB — CULTURE, BLOOD (ROUTINE X 2)
Culture: NO GROWTH
Culture: NO GROWTH
Special Requests: ADEQUATE
Special Requests: ADEQUATE

## 2024-08-22 ENCOUNTER — Ambulatory Visit: Admitting: Orthopedic Surgery

## 2024-08-22 LAB — MINIMUM INHIBITORY CONC. (1 DRUG)

## 2024-08-22 LAB — MIC RESULT

## 2024-08-23 LAB — CULTURE, BLOOD (ROUTINE X 2)

## 2024-08-26 NOTE — Progress Notes (Unsigned)
 The patient attended a screening event on 01/15/24 where his screening results were BP 124/84 and blood glucose 123mg /dl(prediabetic range). At the event the patient noted he had food, transportation and housing SDOH insecurities. Pt noted they do have a pcp. Patient was given SDOH Resources at the event. Pt did not leave a phone number or address. Per chart review pt does have a pcp listed but has not seen them within the last year. Chart review also indicates a few future rehab appts. Post event initia f/u was done on 05/15/2024 CHW will send pt sdoh and pcp resources to the address on file. As well as diabetes, hypertension, tobacco and healthy eating resources via mail. An additional follow up will be done in according to the health equity team's protocol.  60-day f/u CHW called pt pcp that listed in [facility] on 08/26/2024 at [facility] health to confirm that pt is being seen by Dr. Rojelio Jenkins Daring and was last seen on 08/21/2024. The office receptionist also confirmed that pt has a future appt with pcp on 09/18/2024. CHW called pt on 08/26/2024 to follow up on SDOH needs. Patient stated the social workers at the [facility] are currently assisting him with food, housing and transportation. Patients stated he has currently moved into a tiny house. Pt declined SDOH needs. No additional Health equity team support indicated at this time.

## 2024-08-28 NOTE — Congregational Nurse Program (Signed)
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 Yes    Student Assistance N/A    Location Patient Served  [facility]    Encounter Setting CN site    Population Status Unhoused    Insurance Medicaid    Insurance/Financial Assistance Referral N/A    Medication Have Medication Insecurities    Medical Provider Yes    Medical Referrals Made Non-[facility] PCP/Clinic    Medical Appointment Completed N/A    Screenings CN Performed (remember to also record results) NA    CN Interventions Case Management;Advocate/Support;Navigate Healthcare System          RN saw client today at [facility] Client wanting assistance for transportation to [employer]. Client had information stolen and needs to update SS with new bank info. Because of current icy roads RN made appointment using [employer] for client. Client to be picked up at 8:30 am. Ramona client's case manager at [place] will give client a heads up that ride is there. Client also has appointment on 1/30 with orthopedics. Client made aware and all information written down for client. RN called his PCP at [facility] to schedule a follow up appointment after last admission.  Scheduler unable to schedule and sent message to care team. Client denies other needs at this time. RN able to provide client with emotional and spiritual support.  "

## 2024-08-30 ENCOUNTER — Ambulatory Visit: Admitting: Family

## 2024-08-30 NOTE — Congregational Nurse Program (Signed)
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 Yes    Student Assistance N/A    Location Patient Served  [facility]    Encounter Setting Phone/Text/Email    Population Status Unhoused    Insurance Medicaid    Insurance/Financial Assistance Referral N/A    Medication Have Medication Insecurities    Medical Provider Yes    Medical Referrals Made Non-[facility] PCP/Clinic    Medical Appointment Completed N/A    Screenings CN Performed (remember to also record results) NA    CN Interventions Case Management;Advocate/Support;Navigate Healthcare System    ED Visit Averted N/A         RN arranged transportation for client for orthopedic appointment. [employer] case manager given information given client does not have phone. Ride ID # is [id number]. Client to call for pick up.   "

## 2024-09-05 ENCOUNTER — Emergency Department (HOSPITAL_COMMUNITY)

## 2024-09-05 ENCOUNTER — Inpatient Hospital Stay (HOSPITAL_COMMUNITY)
Admission: EM | Admit: 2024-09-05 | Source: Home / Self Care | Attending: Internal Medicine | Admitting: Internal Medicine

## 2024-09-05 DIAGNOSIS — F1994 Other psychoactive substance use, unspecified with psychoactive substance-induced mood disorder: Secondary | ICD-10-CM | POA: Diagnosis present

## 2024-09-05 DIAGNOSIS — R7881 Bacteremia: Secondary | ICD-10-CM | POA: Diagnosis present

## 2024-09-05 DIAGNOSIS — S88111A Complete traumatic amputation at level between knee and ankle, right lower leg, initial encounter: Secondary | ICD-10-CM | POA: Diagnosis present

## 2024-09-05 DIAGNOSIS — T68XXXA Hypothermia, initial encounter: Secondary | ICD-10-CM

## 2024-09-05 DIAGNOSIS — N400 Enlarged prostate without lower urinary tract symptoms: Secondary | ICD-10-CM | POA: Diagnosis not present

## 2024-09-05 DIAGNOSIS — A4102 Sepsis due to Methicillin resistant Staphylococcus aureus: Secondary | ICD-10-CM

## 2024-09-05 DIAGNOSIS — I480 Paroxysmal atrial fibrillation: Secondary | ICD-10-CM | POA: Diagnosis present

## 2024-09-05 DIAGNOSIS — Z86711 Personal history of pulmonary embolism: Secondary | ICD-10-CM | POA: Diagnosis not present

## 2024-09-05 DIAGNOSIS — A419 Sepsis, unspecified organism: Secondary | ICD-10-CM | POA: Diagnosis present

## 2024-09-05 DIAGNOSIS — E872 Acidosis, unspecified: Principal | ICD-10-CM

## 2024-09-05 DIAGNOSIS — Z87898 Personal history of other specified conditions: Secondary | ICD-10-CM

## 2024-09-05 DIAGNOSIS — Z5900 Homelessness unspecified: Secondary | ICD-10-CM

## 2024-09-05 LAB — I-STAT CG4 LACTIC ACID, ED
Lactic Acid, Venous: 6.8 mmol/L (ref 0.5–1.9)
Lactic Acid, Venous: 5.8 mmol/L (ref 0.5–1.9)
Lactic Acid, Venous: 4.1 mmol/L (ref 0.5–1.9)
Lactic Acid, Venous: 2.9 mmol/L (ref 0.5–1.9)

## 2024-09-05 LAB — CBC WITH DIFFERENTIAL/PLATELET
Abs Immature Granulocytes: 0.06 10*3/uL (ref 0.00–0.07)
Basophils Absolute: 0.1 10*3/uL (ref 0.0–0.1)
Basophils Relative: 1 %
Eosinophils Absolute: 0.4 10*3/uL (ref 0.0–0.5)
Eosinophils Relative: 4 %
HCT: 41.4 % (ref 39.0–52.0)
Hemoglobin: 13.4 g/dL (ref 13.0–17.0)
Immature Granulocytes: 1 %
Lymphocytes Relative: 23 %
Lymphs Abs: 2.4 10*3/uL (ref 0.7–4.0)
MCH: 32.5 pg (ref 26.0–34.0)
MCHC: 32.4 g/dL (ref 30.0–36.0)
MCV: 100.5 fL — ABNORMAL HIGH (ref 80.0–100.0)
Monocytes Absolute: 0.9 10*3/uL (ref 0.1–1.0)
Monocytes Relative: 8 %
Neutro Abs: 7 10*3/uL (ref 1.7–7.7)
Neutrophils Relative %: 63 %
Platelets: 197 10*3/uL (ref 150–400)
RBC: 4.12 MIL/uL — ABNORMAL LOW (ref 4.22–5.81)
RDW: 13.9 % (ref 11.5–15.5)
WBC: 10.9 10*3/uL — ABNORMAL HIGH (ref 4.0–10.5)
nRBC: 0 % (ref 0.0–0.2)

## 2024-09-05 LAB — URINALYSIS, ROUTINE W REFLEX MICROSCOPIC
Bilirubin Urine: NEGATIVE
Glucose, UA: NEGATIVE mg/dL
Hgb urine dipstick: NEGATIVE
Ketones, ur: NEGATIVE mg/dL
Leukocytes,Ua: NEGATIVE
Nitrite: NEGATIVE
Protein, ur: NEGATIVE mg/dL
Specific Gravity, Urine: 1.013 (ref 1.005–1.030)
pH: 5 (ref 5.0–8.0)

## 2024-09-05 LAB — URINE DRUG SCREEN
Amphetamines: POSITIVE — AB
Barbiturates: NEGATIVE
Benzodiazepines: NEGATIVE
Cocaine: POSITIVE — AB
Fentanyl: NEGATIVE
Methadone Scn, Ur: NEGATIVE
Opiates: NEGATIVE
Tetrahydrocannabinol: NEGATIVE

## 2024-09-05 LAB — PROTIME-INR
INR: 1.2 (ref 0.8–1.2)
Prothrombin Time: 15.5 s — ABNORMAL HIGH (ref 11.4–15.2)

## 2024-09-05 LAB — COMPREHENSIVE METABOLIC PANEL WITH GFR
ALT: 12 U/L (ref 0–44)
AST: 26 U/L (ref 15–41)
Albumin: 3.7 g/dL (ref 3.5–5.0)
Alkaline Phosphatase: 135 U/L — ABNORMAL HIGH (ref 38–126)
Anion gap: 24 — ABNORMAL HIGH (ref 5–15)
BUN: 15 mg/dL (ref 8–23)
CO2: 14 mmol/L — ABNORMAL LOW (ref 22–32)
Calcium: 9.3 mg/dL (ref 8.9–10.3)
Chloride: 97 mmol/L — ABNORMAL LOW (ref 98–111)
Creatinine, Ser: 1.14 mg/dL (ref 0.61–1.24)
GFR, Estimated: >60 mL/min
Glucose, Bld: 89 mg/dL (ref 70–99)
Potassium: 3.5 mmol/L (ref 3.5–5.1)
Sodium: 135 mmol/L (ref 135–145)
Total Bilirubin: 1.6 mg/dL — ABNORMAL HIGH (ref 0.0–1.2)
Total Protein: 8.1 g/dL (ref 6.5–8.1)

## 2024-09-05 LAB — TROPONIN T, HIGH SENSITIVITY
Troponin T High Sensitivity: 10 ng/L (ref 0–19)
Troponin T High Sensitivity: 9 ng/L (ref 0–19)

## 2024-09-05 LAB — LIPASE, BLOOD: Lipase: 56 U/L — ABNORMAL HIGH (ref 11–51)

## 2024-09-05 MED ORDER — LACTATED RINGERS IV BOLUS
2000.0000 mL | Freq: Once | INTRAVENOUS | Status: AC
  Administered 2024-09-05: 2000 mL via INTRAVENOUS

## 2024-09-05 MED ORDER — FOLIC ACID 1 MG PO TABS
1.0000 mg | ORAL_TABLET | Freq: Every day | ORAL | Status: AC
  Administered 2024-09-05 – 2024-09-06 (×2): 1 mg via ORAL
  Filled 2024-09-05 (×2): qty 1

## 2024-09-05 MED ORDER — ADULT MULTIVITAMIN W/MINERALS CH
1.0000 | ORAL_TABLET | Freq: Every day | ORAL | Status: AC
  Administered 2024-09-05 – 2024-09-06 (×2): 1 via ORAL
  Filled 2024-09-05 (×2): qty 1

## 2024-09-05 MED ORDER — ACETAMINOPHEN 650 MG RE SUPP: 650.0000 mg | Freq: Four times a day (QID) | RECTAL | Status: AC | PRN

## 2024-09-05 MED ORDER — PANTOPRAZOLE SODIUM 40 MG PO TBEC
40.0000 mg | DELAYED_RELEASE_TABLET | Freq: Two times a day (BID) | ORAL | Status: AC
  Administered 2024-09-05 – 2024-09-06 (×3): 40 mg via ORAL
  Filled 2024-09-05 (×3): qty 1

## 2024-09-05 MED ORDER — SODIUM CHLORIDE 0.9% FLUSH
3.0000 mL | Freq: Two times a day (BID) | INTRAVENOUS | Status: AC
  Administered 2024-09-05 – 2024-09-06 (×2): 3 mL via INTRAVENOUS

## 2024-09-05 MED ORDER — ALBUTEROL SULFATE (2.5 MG/3ML) 0.083% IN NEBU: 2.5000 mg | INHALATION_SOLUTION | RESPIRATORY_TRACT | Status: AC | PRN

## 2024-09-05 MED ORDER — ACETAMINOPHEN 325 MG PO TABS
650.0000 mg | ORAL_TABLET | Freq: Once | ORAL | Status: AC
  Administered 2024-09-05: 650 mg via ORAL
  Filled 2024-09-05: qty 2

## 2024-09-05 MED ORDER — LACTATED RINGERS IV SOLN: INTRAVENOUS | Status: DC

## 2024-09-05 MED ORDER — DAPTOMYCIN-SODIUM CHLORIDE 700-0.9 MG/100ML-% IV SOLN
8.0000 mg/kg | Freq: Every day | INTRAVENOUS | Status: AC
  Administered 2024-09-05 – 2024-09-06 (×2): 700 mg via INTRAVENOUS
  Filled 2024-09-05 (×2): qty 100

## 2024-09-05 MED ORDER — ACETAMINOPHEN 325 MG PO TABS: 650.0000 mg | ORAL_TABLET | Freq: Four times a day (QID) | ORAL | Status: AC | PRN

## 2024-09-05 MED ORDER — BISACODYL 5 MG PO TBEC: 5.0000 mg | DELAYED_RELEASE_TABLET | Freq: Every day | ORAL | Status: AC | PRN

## 2024-09-05 MED ORDER — THIAMINE MONONITRATE 100 MG PO TABS
100.0000 mg | ORAL_TABLET | Freq: Every day | ORAL | Status: AC
  Administered 2024-09-05 – 2024-09-06 (×2): 100 mg via ORAL
  Filled 2024-09-05 (×2): qty 1

## 2024-09-05 MED ORDER — ENOXAPARIN SODIUM 40 MG/0.4ML IJ SOSY: 40.0000 mg | PREFILLED_SYRINGE | Freq: Every day | INTRAMUSCULAR | Status: DC

## 2024-09-05 MED ORDER — ENOXAPARIN SODIUM 40 MG/0.4ML IJ SOSY
40.0000 mg | PREFILLED_SYRINGE | Freq: Every day | INTRAMUSCULAR | Status: AC
  Administered 2024-09-06: 40 mg via SUBCUTANEOUS
  Filled 2024-09-05: qty 0.4

## 2024-09-05 NOTE — ED Provider Notes (Signed)
 " [place] EMERGENCY DEPARTMENT AT [facility] Provider Note   CSN: [id number] Arrival date & time: 09/05/24  1413     Patient presents with: Cold Exposure   Cory Palmer is a 64 y.o. male.   Patient is a 64 year old male with a past medical history of homelessness, substance use, diabetes with bilateral BKA, A-fib and prior PE not on AC due to medication noncompliance, Bipolar disorder, cirrhosis and recent admission for MRSA bacteremia presenting to the emergency department with multiple complaints.  He reports that the police were called today due to him being stuck out and feel cold.  He states that he has also been feeling sick with pain all over including chest pain, abdominal pain, and bilateral leg pain.  He denies any trauma or falls.  He states he has been feeling short of breath and also endorses nausea and vomiting.  He states that he has been feeling feverish and chilled.  He states that the only new medication he has been taking is Seroquel .  The history is provided by the patient.       Prior to Admission medications  Medication Sig Start Date End Date Taking? Authorizing Provider  collagenase  (SANTYL ) 250 UNIT/GM ointment Apply topically daily. Apply Santyl  to bilat stump wounds every day, then cover with moist gauze and foam dressings, change foam dressings every 3 days or as needed soiling 07/10/24   Cheryle Page, MD  ELIQUIS  5 MG TABS tablet Take 5 mg by mouth 2 (two) times daily. 08/09/24   [provider]  ondansetron  (ZOFRAN ) 4 MG tablet Take 1 tablet (4 mg total) by mouth every 8 (eight) hours as needed for nausea or vomiting. 07/10/24   Cheryle Page, MD  pantoprazole  (PROTONIX ) 40 MG tablet Take 1 tablet (40 mg total) by mouth 2 (two) times daily before a meal. 07/10/24   Cheryle Page, MD  traZODone  (DESYREL ) 150 MG tablet Take 150 mg by mouth at bedtime. 04/17/24   [provider]    Allergies: Atorvastatin calcium  and Gemfibrozil     Review of Systems  Updated Vital Signs BP 104/70   Pulse 92   Temp (!) 97.2 F (36.2 C) (Oral)   Resp 18   Wt 110 kg   SpO2 97%   BMI 32.89 kg/m   Physical Exam Vitals and nursing note reviewed.  Constitutional:      General: He is not in acute distress.    Appearance: Normal appearance.  HENT:     Head: Normocephalic.     Nose: Nose normal.     Mouth/Throat:     Mouth: Mucous membranes are moist.  Eyes:     Extraocular Movements: Extraocular movements intact.     Conjunctiva/sclera: Conjunctivae normal.  Cardiovascular:     Rate and Rhythm: Normal rate. Rhythm irregular.     Heart sounds: Normal heart sounds.  Pulmonary:     Effort: Pulmonary effort is normal.     Breath sounds: Normal breath sounds.  Abdominal:     General: Abdomen is flat.     Palpations: Abdomen is soft.     Tenderness: There is no abdominal tenderness.  Musculoskeletal:        General: Normal range of motion.     Cervical back: Normal range of motion.     Right lower leg: No edema.     Left lower leg: No edema.     Comments: Bilateral BKA  Skin:    General: Skin is warm  and dry.  Neurological:     General: No focal deficit present.     Mental Status: He is alert and oriented to person, place, and time.  Psychiatric:        Mood and Affect: Mood normal.        Behavior: Behavior normal.     (all labs ordered are listed, but only abnormal results are displayed) Labs Reviewed  CBC WITH DIFFERENTIAL/PLATELET - Abnormal; Notable for the following components:      Result Value   WBC 10.9 (*)    RBC 4.12 (*)    MCV 100.5 (*)    All other components within normal limits  COMPREHENSIVE METABOLIC PANEL WITH GFR - Abnormal; Notable for the following components:   Chloride 97 (*)    CO2 14 (*)    Alkaline Phosphatase 135 (*)    Total Bilirubin 1.6 (*)    Anion gap 24 (*)    All other components within normal limits  PROTIME-INR - Abnormal; Notable for the following components:    Prothrombin Time 15.5 (*)    All other components within normal limits  LIPASE, BLOOD - Abnormal; Notable for the following components:   Lipase 56 (*)    All other components within normal limits  I-STAT CG4 LACTIC ACID, ED - Abnormal; Notable for the following components:   Lactic Acid, Venous 6.8 (*)    All other components within normal limits  I-STAT CG4 LACTIC ACID, ED - Abnormal; Notable for the following components:   Lactic Acid, Venous 5.8 (*)    All other components within normal limits  CULTURE, BLOOD (ROUTINE X 2)  CULTURE, BLOOD (ROUTINE X 2)  TROPONIN T, HIGH SENSITIVITY  TROPONIN T, HIGH SENSITIVITY    EKG: EKG Interpretation Date/Time:  Thursday September 05 2024 15:24:14 EST Ventricular Rate:  108 PR Interval:    QRS Duration:  123 QT Interval:  405 QTC Calculation: 558 R Axis:   46  Text Interpretation: Atrial fibrillation Ventricular premature complex Nonspecific intraventricular conduction delay Interpretation limited secondary to artifact Otherwise no significant change Confirmed by Ellouise Fine ([id number]) on 09/05/2024 3:36:53 PM  Radiology: [facility] Chest Port 1 View Result Date: 09/05/2024 CLINICAL DATA:  Generalized pain. EXAM: PORTABLE CHEST 1 VIEW COMPARISON:  August 14, 2024 FINDINGS: The heart size and mediastinal contours are within normal limits. Both lungs are clear. The visualized skeletal structures are unremarkable. IMPRESSION: No active disease. Electronically Signed   By: Suzen Dials M.D.   On: 09/05/2024 16:53     .Critical Care  Performed by: Kingsley, Dauntae Derusha K, DO Authorized by: Ellouise Fine POUR, DO   Critical care provider statement:    Critical care time (minutes):  30   Critical care was time spent personally by me on the following activities:  Development of treatment plan with patient or surrogate, discussions with consultants, evaluation of patient's response to treatment, examination of patient, ordering and review of  laboratory studies, ordering and review of radiographic studies, ordering and performing treatments and interventions, pulse oximetry, re-evaluation of patient's condition and review of old charts    Medications Ordered in the ED  acetaminophen  (TYLENOL ) tablet 650 mg (has no administration in time range)  lactated ringers  bolus 2,000 mL (2,000 mLs Intravenous New Bag/Given 09/05/24 1558)    Clinical Course as of 09/05/24 1710  Thu Sep 05, 2024  1615 I spoke with Dr. Fleeta Rothman with infectious disease who recommended Daptomycin  if he is willing to stay for admission or ortavancin  if he is planning on leaving AMA. [VK]  1642 Patient reports he is agreeable to admission. Lactic downtrending.  [VK]    Clinical Course User Index [VK] Kingsley, Kwynn Schlotter K, DO                                 Medical Decision Making This patient presents to the ED with chief complaint(s) of cold exposure, body pain, N/V with pertinent past medical history of homelessness, substance use, diabetes with bilateral BKA, A-fib and prior PE not on AC due to medication noncompliance, Bipolar disorder, cirrhosis and recent admission for MRSA bacteremia which further complicates the presenting complaint. The complaint involves an extensive differential diagnosis and also carries with it a high risk of complications and morbidity.    The differential diagnosis includes ACS, arrhythmia, anemia, pneumonia, pneumothorax, pulmonary edema, pleural effusion, dehydration, electrolyte abnormality, hypothermia, sepsis  Additional history obtained: Additional history obtained from N/A Records reviewed previous admission documents  ED Course and Reassessment: On patient's arrival he was tachycardic and otherwise hemodynamically stable in no acute distress.  He had labs initiated in triage including blood cultures with his recent bacteremia.  Patient will need a temperature obtained and we will plan to contact infectious disease regarding  antibiotic regimen.  He was initially recommended to return to the ED on 1/23 for a repeat dose of antibiotics.  Patient will additionally have EKG and troponin added on with his report of chest pain and he will be closely reassessed.  Independent labs interpretation:  The following labs were independently interpreted: AGAP metabolic acidosis, lactic acidosis, mild leukocytosis, though may be hemoconcentration   Independent visualization of imaging: - I independently visualized the following imaging with scope of interpretation limited to determining acute life threatening conditions related to emergency care: CXR, which revealed no acute disease  Consultation: - Consulted or discussed management/test interpretation w/ external professional: ID, hospitalist   Consideration for admission or further workup: patient requires admission for lactic acidosis and eval for bacteremia  Social Determinants of health: homelessness, substance use    Amount and/or Complexity of Data Reviewed Labs: ordered. Radiology: ordered.  Risk OTC drugs. Decision regarding hospitalization.       Final diagnoses:  Lactic acidosis  History of bacteremia    ED Discharge Orders     None          Ellouise Richerd POUR, DO 09/05/24 1710  "

## 2024-09-05 NOTE — ED Triage Notes (Signed)
 [place], cold and generalized pain 10/10 going on for years, EMS reports VSS.

## 2024-09-05 NOTE — ED Provider Triage Note (Cosign Needed Addendum)
 Emergency Medicine Provider Triage Evaluation Note  Cory Palmer , a 64 y.o. male  was evaluated in triage.  Pt complains of cold exposure. Is homeless. Also with generalized pain for years. Patient also nauseated in triage.  Also recent hospital notes of being admitted for MRSA bacteremia and leaving AMA.  Review of Systems  Positive:  Negative:   Physical Exam  There were no vitals taken for this visit. Gen:   Awake, no distress   Resp:  Normal effort  MSK:   Moves extremities without difficulty  Other:   Medical Decision Making  Medically screening exam initiated at 2:20 PM.  Appropriate orders placed.  Cory Palmer was informed that the remainder of the evaluation will be completed by another provider, this initial triage assessment does not replace that evaluation, and the importance of remaining in the ED until their evaluation is complete.        Cory Palmer, [place] 09/05/24 1425

## 2024-09-05 NOTE — ED Notes (Signed)
 Pt. I-stat CG4 Lactic Acid 4.1, RN made aware.

## 2024-09-05 NOTE — H&P (Addendum)
 " History and Physical    Patient: Cory Palmer FMW:[id number] DOB: [birth year] DOA: 09/05/2024 DOS: the patient was seen and examined on 09/05/2024 PCP: Delores Rojelio Caldron, NP  Patient coming from: Homeless  Chief Complaint:  Chief Complaint  Patient presents with   Cold Exposure   HPI: Cory Palmer is a 64 y.o. 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   He was last discharged just under 3 weeks ago.   He was admitted from January 11 through 12 and found to have MRSA positive blood cultures.  He signed out after 24 hours AGAINST MEDICAL ADVICE.  He came back to the hospital on January 14 complaining of not feeling well and was readmitted and started on Oritavancin.  He stayed in the hospital for 4 days receiving antibiotics and left AGAINST MEDICAL ADVICE on January 19.   In the emergency department today his evaluation found that his initial lactic acid was 6.8.  His white blood cell count 10.9.  He did have tachycardia at 1 point in the emergency department so he does meet sepsis criteria.  Blood cultures were drawn.  Infectious disease was consulted by the emergency department and they have recommended daptomycin  because of his recent history of MRSA bacteremia which was not completely treated. He will be admitted to the hospitalist service for continued antibiotics.   Review of Systems: As mentioned in the history of present illness. All other systems reviewed and are negative. 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   Severe sepsis (HCC) 03/24/2022   Stroke (HCC)    Upper GI bleed 07/08/2024   Past Surgical History:  Procedure Laterality Date   AMPUTATION Left 09/28/2018   Procedure: LEFT TRANSMETATARSAL AMPUTATION;  Surgeon: Harden Jerona GAILS, MD;  Location: [facility] OR;  Service: Orthopedics;  Laterality: Left;   AMPUTATION Left 10/23/2019   Procedure: AMPUTATION BELOW KNEE;  Surgeon: Harden Jerona GAILS, MD;  Location: [facility] OR;  Service: Orthopedics;  Laterality: Left;   BELOW KNEE LEG AMPUTATION Right    ESOPHAGOGASTRODUODENOSCOPY N/A 07/09/2024   Procedure: EGD (ESOPHAGOGASTRODUODENOSCOPY);  Surgeon: Elicia Claw, MD;  Location: [facility];  Service: Gastroenterology;  Laterality: N/A;   ESOPHAGOGASTRODUODENOSCOPY (EGD) WITH PROPOFOL  N/A 10/19/2020   Procedure: ESOPHAGOGASTRODUODENOSCOPY (EGD) WITH PROPOFOL ;  Surgeon: Kristie Lamprey, MD;  Location: [facility];  Service: Endoscopy;  Laterality: N/A;   TEE WITHOUT CARDIOVERSION N/A 10/25/2019   Procedure: TRANSESOPHAGEAL ECHOCARDIOGRAM (TEE);  Surgeon: Pietro Redell RAMAN, MD;  Location: [facility] ENDOSCOPY;  Service: Cardiovascular;  Laterality: N/A;   Social History:  reports that he has been smoking cigarettes. He has been exposed to tobacco smoke. He has never used smokeless tobacco. He reports that he does not currently use alcohol. He reports that he does not currently use  drugs after having used the following drugs: Cocaine.  Allergies[1]  Family History  Problem Relation Age of Onset   Hypertension Other    Diabetes Mellitus II Other     Prior to Admission medications  Medication Sig Start Date End Date Taking? Authorizing Provider  collagenase  (SANTYL )  250 UNIT/GM ointment Apply topically daily. Apply Santyl  to bilat stump wounds every day, then cover with moist gauze and foam dressings, change foam dressings every 3 days or as needed soiling 07/10/24   Cheryle Page, MD  ELIQUIS  5 MG TABS tablet Take 5 mg by mouth 2 (two) times daily. 08/09/24   [provider]  ondansetron  (ZOFRAN ) 4 MG tablet Take 1 tablet (4 mg total) by mouth every 8 (eight) hours as needed for nausea or vomiting. 07/10/24   Cheryle Page, MD  pantoprazole  (PROTONIX ) 40 MG tablet Take 1 tablet (40 mg total) by mouth 2 (two) times daily before a meal. 07/10/24   Cheryle, Kshitiz, MD  traZODone  (DESYREL ) 150 MG tablet Take 150 mg by mouth at bedtime. 04/17/24   [provider]    Physical Exam: Vitals:   09/05/24 1427 09/05/24 1428 09/05/24 1538 09/05/24 1600  BP: (!) 112/95   104/70  Pulse: (!) 129   92  Resp: 18   18  Temp:   (!) 97.2 F (36.2 C)   TempSrc:   Oral   SpO2:  93%  97%  Weight:    110 kg   Physical Exam:  General: No acute distress, well developed, well nourished HEENT: Normocephalic, atraumatic, PERRL Cardiovascular: Normal rate and rhythm. Distal pulses intact. Pulmonary: Normal pulmonary effort, normal breath sounds Gastrointestinal: Nondistended abdomen, soft, non-tender, normoactive bowel sounds Musculoskeletal:Normal ROM, no lower ext edema Bilateral bka Lymphadenopathy: No cervical LAD. Skin: Skin is warm and dry, multiple abrasions Neuro: No focal deficits noted, AAOx3. PSYCH: Attentive and cooperative  Data Reviewed:  Results for orders placed or performed during the hospital encounter of 09/05/24 (from the past 24 hours)  Blood culture (routine x 2)     Status: None (Preliminary result)   Collection Time: 09/05/24  3:00 PM   Specimen: BLOOD RIGHT HAND  Result Value Ref Range   Specimen Description      BLOOD RIGHT HAND Performed at [facility], [address], [place], [place] [address]    Special  Requests      BOTTLES DRAWN AEROBIC ONLY Blood Culture results may not be optimal due to an inadequate volume of blood received in culture bottles Performed at [facility], [address] [place], [place] [address]    Culture PENDING    Report Status PENDING   CBC with Differential     Status: Abnormal   Collection Time: 09/05/24  3:24 PM  Result Value Ref Range   WBC 10.9 (H) 4.0 - 10.5 K/uL   RBC 4.12 (L) 4.22 - 5.81 MIL/uL   Hemoglobin 13.4 13.0 - 17.0 g/dL   HCT 58.5 60.9 - 47.9 %   MCV 100.5 (H) 80.0 - 100.0 fL   MCH 32.5 26.0 - 34.0 pg   MCHC 32.4 30.0 - 36.0 g/dL   RDW 86.0 88.4 - 84.4 %   Platelets 197 150 - 400 K/uL   nRBC 0.0 0.0 - 0.2 %   Neutrophils Relative % 63 %   Neutro Abs 7.0 1.7 - 7.7 K/uL   Lymphocytes Relative 23 %   Lymphs Abs 2.4 0.7 - 4.0 K/uL   Monocytes Relative  8 %   Monocytes Absolute 0.9 0.1 - 1.0 K/uL   Eosinophils Relative 4 %   Eosinophils Absolute 0.4 0.0 - 0.5 K/uL   Basophils Relative 1 %   Basophils Absolute 0.1 0.0 - 0.1 K/uL   Immature Granulocytes 1 %   Abs Immature Granulocytes 0.06 0.00 - 0.07 K/uL  Comprehensive metabolic panel     Status: Abnormal   Collection Time: 09/05/24  3:24 PM  Result Value Ref Range   Sodium 135 135 - 145 mmol/L   Potassium 3.5 3.5 - 5.1 mmol/L   Chloride 97 (L) 98 - 111 mmol/L   CO2 14 (L) 22 - 32 mmol/L   Glucose, Bld 89 70 - 99 mg/dL   BUN 15 8 - 23 mg/dL   Creatinine, Ser 8.85 0.61 - 1.24 mg/dL   Calcium  9.3 8.9 - 10.3 mg/dL   Total Protein 8.1 6.5 - 8.1 g/dL   Albumin 3.7 3.5 - 5.0 g/dL   AST 26 15 - 41 U/L   ALT 12 0 - 44 U/L   Alkaline Phosphatase 135 (H) 38 - 126 U/L   Total Bilirubin 1.6 (H) 0.0 - 1.2 mg/dL   GFR, Estimated >39 >39 mL/min   Anion gap 24 (H) 5 - 15  Blood culture (routine x 2)     Status: None (Preliminary result)   Collection Time: 09/05/24  3:24 PM   Specimen: BLOOD LEFT HAND  Result Value Ref Range   Specimen Description      BLOOD LEFT  HAND Performed at [facility], [address] [place], [place] [address]    Special Requests      BOTTLES DRAWN AEROBIC ONLY Blood Culture adequate volume Performed at [facility], [address] [place], [place] [address]    Culture PENDING    Report Status PENDING   Protime-INR     Status: Abnormal   Collection Time: 09/05/24  3:24 PM  Result Value Ref Range   Prothrombin Time 15.5 (H) 11.4 - 15.2 seconds   INR 1.2 0.8 - 1.2  Troponin T, High Sensitivity     Status: None   Collection Time: 09/05/24  3:29 PM  Result Value Ref Range   Troponin T High Sensitivity 10 0 - 19 ng/L  I-Stat CG4 Lactic Acid     Status: Abnormal   Collection Time: 09/05/24  3:34 PM  Result Value Ref Range   Lactic Acid, Venous 6.8 (HH) 0.5 - 1.9 mmol/L  Lipase, blood     Status: Abnormal   Collection Time: 09/05/24  3:37 PM  Result Value Ref Range   Lipase 56 (H) 11 - 51 U/L  I-Stat CG4 Lactic Acid     Status: Abnormal   Collection Time: 09/05/24  4:36 PM  Result Value Ref Range   Lactic Acid, Venous 5.8 (HH) 0.5 - 1.9 mmol/L   Comment NOTIFIED PHYSICIAN      Assessment and Plan: Sepsis/ h/o incomplete treatment of MRSA bacteremia.  Known infection and lactic of 6.8.  He received 2 L of IV fluids in the emergency department. - Daptomycin  per ID recommendation.  - Blood cultures pending.  Has a history of diabetes mellitus type 2 but he does not take any medication.  His last hemoglobin A1c was 5.7.  He is does not have routine meals because of his homelessness. - Will order regular diet  3.  History of pulmonary embolism 2024/ paroxysmal atrial fibrillation - Not on Eliquis  because of thrombocytopenia,  GI bleed 2 months ago, and unable to afford it. The patient was found to have multiple small duodenal ulcers, esophagitis and gastritis.  Protonix  was recommended but he has not been taking the Protonix .  Will hold off on restarting his Eliquis .  Resume Protonix   4.   History of cocaine use - Monitor. Cessation counseling needed.  5. History of alcohol abuse - No signs of withdrawal and has not had withdrawal issues with previous hospitalizations  6. Homelessness - the patient is tearful and says he cannot make it on the streets anymore and is willing to go to facility.  He says he is actually been approved for housing also but he does not have the details. - Social work Dealer Care Planning:   Code Status: Prior  The patient names his son as a runner, broadcasting/film/video and wants to be full code. Consults: Infectious disease  Family Communication: None  Severity of Illness: The appropriate patient status for this patient is INPATIENT. Inpatient status is judged to be reasonable and necessary in order to provide the required intensity of service to ensure the patient's safety. The patient's presenting symptoms, physical exam findings, and initial radiographic and laboratory data in the context of their chronic comorbidities is felt to place them at high risk for further clinical deterioration. Furthermore, it is not anticipated that the patient will be medically stable for discharge from the hospital within 2 midnights of admission.   * I certify that at the point of admission it is my clinical judgment that the patient will require inpatient hospital care spanning beyond 2 midnights from the point of admission due to high intensity of service, high risk for further deterioration and high frequency of surveillance required.*  Author: ARTHEA CHILD, MD 09/05/2024 5:47 PM  For on call review [email or web address].      [1]  Allergies Allergen Reactions   Atorvastatin Calcium  Nausea And Vomiting   Gemfibrozil Other (See Comments)    Pt cannot recall symptoms   "

## 2024-09-05 NOTE — ED Notes (Signed)
 Cory Palmer, medic called lab and added on lipase.

## 2024-09-05 NOTE — Congregational Nurse Program (Signed)
" °  Dept: [phone]   Congregational Nurse Program Note  Date of Encounter: 09/05/2024  Past Medical History: Past Medical History:  Diagnosis Date   Acute hepatic encephalopathy (HCC) 11/17/2021   Anxiety    Atrial flutter with rapid ventricular response (HCC) 10/21/2019   Depressed bipolar disorder (HCC)    Diabetes mellitus without complication (HCC)    Hypertension    Liver cirrhosis (HCC)    Osteomyelitis of left foot (HCC) 10/19/2019   Osteomyelitis of toe of left foot (HCC)    PAF (paroxysmal atrial fibrillation) (HCC)    S/P transmetatarsal amputation of foot, 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   Severe sepsis (HCC) 03/24/2022   Stroke (HCC)    Upper GI bleed 07/08/2024    Encounter Details:  Community Questionnaire - 09/05/24 1210       Questionnaire   Ask client: Do you give verbal consent for me to treat you today? Yes    Student Assistance N/A    Location Patient Served  [facility]    Encounter Setting Phone/Text/Email;CN site    Population Status Unhoused    Insurance Medicaid    Insurance/Financial Assistance Referral N/A    Medication Have Medication Insecurities    Medical Provider Yes    Medical Referrals Made NA    Medical Appointment Completed N/A    Screenings CN Performed (remember to also record results) NA    CN Interventions Case Management;Advocate/Support;Navigate Healthcare System    ED Visit Averted N/A         Ramona Case Manager at [facility] reached out about the clients wheelchair being broken. RN called client's PCP to obtain order for new wheelchair. PCP office called back and client is not eligible for a new wheelchair at this time. Client's medicaid only covers one wheelchair every 5 years. RN will attempt to locate wheelchair within the community. No other acute needs at this time.    "

## 2024-09-05 NOTE — Significant Event (Addendum)
 Hospitalist cross cover   The patient's bed assignment had to be changed.  They would not take him on the floor because his lactic acid was too high so the patient is going to be put in a progressive bed.  His lactic is trending down.  It was 6.8 it is now 4.1.  He is on continuous LR.

## 2024-09-05 NOTE — ED Notes (Signed)
 Pt. I-stat CG4 Lactic Acid results 2.9, RN, Madison made aware.

## 2024-09-06 ENCOUNTER — Encounter (HOSPITAL_COMMUNITY): Payer: Self-pay | Admitting: Internal Medicine

## 2024-09-06 ENCOUNTER — Other Ambulatory Visit: Payer: Self-pay

## 2024-09-06 DIAGNOSIS — T68XXXA Hypothermia, initial encounter: Secondary | ICD-10-CM

## 2024-09-06 DIAGNOSIS — E872 Acidosis, unspecified: Secondary | ICD-10-CM

## 2024-09-06 LAB — CBC
HCT: 33.3 % — ABNORMAL LOW (ref 39.0–52.0)
Hemoglobin: 11.3 g/dL — ABNORMAL LOW (ref 13.0–17.0)
MCH: 32.2 pg (ref 26.0–34.0)
MCHC: 33.9 g/dL (ref 30.0–36.0)
MCV: 94.9 fL (ref 80.0–100.0)
Platelets: 134 10*3/uL — ABNORMAL LOW (ref 150–400)
RBC: 3.51 MIL/uL — ABNORMAL LOW (ref 4.22–5.81)
RDW: 13.8 % (ref 11.5–15.5)
WBC: 6.2 10*3/uL (ref 4.0–10.5)
nRBC: 0 % (ref 0.0–0.2)

## 2024-09-06 LAB — CULTURE, BLOOD (ROUTINE X 2)
Culture: NO GROWTH
Culture: NO GROWTH
Special Requests: ADEQUATE

## 2024-09-06 LAB — BASIC METABOLIC PANEL WITH GFR
Anion gap: 11 (ref 5–15)
BUN: 12 mg/dL (ref 8–23)
CO2: 23 mmol/L (ref 22–32)
Calcium: 8.9 mg/dL (ref 8.9–10.3)
Chloride: 103 mmol/L (ref 98–111)
Creatinine, Ser: 0.74 mg/dL (ref 0.61–1.24)
GFR, Estimated: >60 mL/min
Glucose, Bld: 92 mg/dL (ref 70–99)
Potassium: 3.6 mmol/L (ref 3.5–5.1)
Sodium: 137 mmol/L (ref 135–145)

## 2024-09-06 LAB — LACTIC ACID, PLASMA: Lactic Acid, Venous: 2.2 mmol/L (ref 0.5–1.9)

## 2024-09-06 LAB — VITAMIN B12: Vitamin B-12: 374 pg/mL (ref 180–914)

## 2024-09-06 LAB — FOLATE: Folate: 16.1 ng/mL

## 2024-09-06 MED ORDER — LACTATED RINGERS IV SOLN: INTRAVENOUS | Status: AC

## 2024-09-06 MED ORDER — ALPRAZOLAM 0.25 MG PO TABS
0.2500 mg | ORAL_TABLET | Freq: Three times a day (TID) | ORAL | Status: AC | PRN
  Administered 2024-09-06 (×2): 0.25 mg via ORAL
  Filled 2024-09-06 (×2): qty 1

## 2024-09-06 MED ORDER — NICOTINE 14 MG/24HR TD PT24
14.0000 mg | MEDICATED_PATCH | Freq: Every day | TRANSDERMAL | Status: AC
  Administered 2024-09-06: 14 mg via TRANSDERMAL
  Filled 2024-09-06: qty 1

## 2024-09-06 MED ORDER — TRAZODONE HCL 50 MG PO TABS
50.0000 mg | ORAL_TABLET | Freq: Once | ORAL | Status: AC
  Administered 2024-09-06: 50 mg via ORAL
  Filled 2024-09-06: qty 1

## 2024-09-06 MED ORDER — LORATADINE 10 MG PO TABS
10.0000 mg | ORAL_TABLET | Freq: Once | ORAL | Status: AC
  Administered 2024-09-06: 10 mg via ORAL
  Filled 2024-09-06: qty 1

## 2024-09-06 MED ORDER — QUETIAPINE FUMARATE 200 MG PO TABS
300.0000 mg | ORAL_TABLET | Freq: Once | ORAL | Status: AC
  Administered 2024-09-06: 300 mg via ORAL
  Filled 2024-09-06: qty 1

## 2024-09-06 NOTE — ED Notes (Signed)
 Pt calmer. Questions answered. Updated with plan, process, wait, admission, results and timeframe with rationale. IV secured, re-dressed.

## 2024-09-06 NOTE — ED Notes (Addendum)
 Pt. Given bed pan for bm. Pt. Cleaned up with peri-cleanse on washcloth and dryed, pt. had medium, brown bm. Pt. new chucks applied with 2 assist.

## 2024-09-06 NOTE — ED Notes (Signed)
 Returned to monitor. Denies sx, questions or complaints.

## 2024-09-06 NOTE — ED Notes (Signed)
 UIp to b/r via w/c with assist, NAD, calm, interactive, resps e/u, speaking in clear complete sentences.

## 2024-09-06 NOTE — ED Notes (Signed)
 Pulled self off monitor, as he wants to leave. Admitting notified.

## 2024-09-06 NOTE — ED Notes (Signed)
 Pt yelling, rude and beligerent. Attempted to update. Boundaries set. States, he is leaving.

## 2024-09-06 NOTE — Progress Notes (Signed)
 " PROGRESS NOTE    Cory Palmer  FMW:[id number] DOB: 1961-01-15 DOA: 09/05/2024 PCP: Delores Rojelio Caldron, NP   Brief Narrative:  64 y.o. 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  On presentation, initial lactic acid was 6.8, patient was tachycardic.  Blood cultures were sent.  He was started on daptomycin  as per ID.  Assessment & Plan:   Severe sepsis: Present on admission Lactic acidosis: Improving History of incomplete treatment of MRSA bacteremia -Presented because of extreme cold and was found to have lactic acidosis.  Treated with IV fluids and empirically started on daptomycin  as per ID since patient has a history of incomplete MRSA bacteremia treatment as patient signed out twice in January 2026 -Follow blood cultures.  Follow ID recommendations.  History of cocaine/alcohol abuse -Urine drug screen positive for cocaine and amphetamines.  TOC consult  Anemia of chronic disease - From chronic illnesses hemoglobin stable.  Monitor intermittently  Thrombocytopenia Questionable cause.  No signs of bleeding.  Monitor intermittently.  History of pulmonary embolism in 2024 Paroxysmal A-fib - Not on Eliquis  because of thrombocytopenia, GI bleed 2 months ago and unable to afford it.  History of upper GI bleeding in December 2025 - patient was found to have multiple small duodenal ulcers, esophagitis and gastritis.  Protonix  was recommended but he has not been taking the Protonix .  Will hold off on restarting  his Eliquis .  Resume Protonix    Diabetes mellitus type 2 -Currently diet controlled  Bipolar disorder/depression/anxiety - Intermittently gets very agitated.  Noncompliant with any medications in the outpatient setting.  Consult psychiatry  Homelessness - TOC consult  Cirrhosis of liver with portal hypertension and thrombocytopenia -Possibly from alcohol use.  Currently compensated.  Outpatient follow-up with PCP and/or GI.  Obesity class II - Outpatient follow-up  DVT prophylaxis: Lovenox  Code Status: Full Family Communication: None at bedside Disposition Plan: Status is: Inpatient Remains inpatient appropriate because: Of severity of illness    Consultants: ID  Procedures: None  Antimicrobials: Daptomycin  from 09/05/2024 onwards   Subjective: Patient seen and examined at bedside.  Poor historian.  Was intermittently agitated as per nursing staff.  No fever, seizures or vomiting reported.  Objective: Vitals:   09/06/24 0900 09/06/24 0915 09/06/24 0930 09/06/24 1045  BP: 122/78     Pulse: 71 66 74 86  Resp: 20 18 (!) 27 (!) 26  Temp:      TempSrc:      SpO2: 95% 96% 99% 95%  Weight:        Intake/Output Summary (Last 24 hours) at 09/06/2024 1135 Last data filed at 09/06/2024 0942 Gross per 24 hour  Intake 3 ml  Output --  Net 3 ml   Filed Weights   09/05/24 1600  Weight: 110 kg    Examination:  General exam: Appears calm and comfortable.  Chronically ill and deconditioned Respiratory system: Bilateral decreased breath sounds at bases with intermittent tachypnea Cardiovascular system: S1 & S2 heard, Rate controlled Gastrointestinal system: Abdomen is obese, nondistended, soft and nontender. Normal bowel sounds heard. Extremities: Bilateral KA present  Central nervous system: Alert and oriented.  Slow to respond.  No focal neurological deficits. Moving extremities Skin: No rashes, lesions or ulcers Psychiatry: Flat affect.  Not agitated.  Data Reviewed: I  have personally reviewed following labs and imaging studies  CBC: Recent Labs  Lab 09/05/24 1524 09/06/24 0451  WBC 10.9* 6.2  NEUTROABS 7.0  --   HGB 13.4 11.3*  HCT 41.4 33.3*  MCV 100.5* 94.9  PLT 197 134*   Basic Metabolic Panel: Recent Labs  Lab 09/05/24 1524 09/06/24 0451  NA 135 137  K 3.5 3.6  CL 97* 103  CO2 14* 23  GLUCOSE 89 92  BUN 15 12  CREATININE 1.14 0.74  CALCIUM  9.3 8.9   GFR: Estimated Creatinine Clearance: 119.5 mL/min (by C-G formula based on SCr of 0.74 mg/dL). Liver Function Tests: Recent Labs  Lab 09/05/24 1524  AST 26  ALT 12  ALKPHOS 135*  BILITOT 1.6*  PROT 8.1  ALBUMIN 3.7   Recent Labs  Lab 09/05/24 1537  LIPASE 56*   No results for input(s): AMMONIA in the last 168 hours. Coagulation Profile: Recent Labs  Lab 09/05/24 1524  INR 1.2   Cardiac Enzymes: No results for input(s): CKTOTAL, CKMB, CKMBINDEX, TROPONINI in the last 168 hours. BNP (last 3 results) No results for input(s): PROBNP in the last 8760 hours. HbA1C: No results for input(s): HGBA1C in the last 72 hours. CBG: No results for input(s): GLUCAP in the last 168 hours. Lipid Profile: No results for input(s): CHOL, HDL, LDLCALC, TRIG, CHOLHDL, LDLDIRECT in the last 72 hours. Thyroid Function Tests: No results for input(s): TSH, T4TOTAL, FREET4, T3FREE, THYROIDAB in the last 72 hours. Anemia Panel: Recent Labs    09/06/24 0451  VITAMINB12 374  FOLATE 16.1   Sepsis Labs: Recent Labs  Lab 09/05/24 1534 09/05/24 1636 09/05/24 2025 09/05/24 2314  LATICACIDVEN 6.8* 5.8* 4.1* 2.9*    Recent Results (from the past 240 hours)  Blood culture (routine x 2)     Status: None (Preliminary result)   Collection Time: 09/05/24  3:00 PM   Specimen: BLOOD RIGHT HAND  Result Value Ref Range Status   Specimen Description   Final    BLOOD RIGHT HAND Performed at [facility], [address], [place], [place] [address]     Special Requests   Final    BOTTLES DRAWN AEROBIC ONLY Blood Culture results may not be optimal due to an inadequate volume of blood received in culture bottles Performed at [facility], [address], [place], [place] [address]    Culture   Final    NO GROWTH < 24 HOURS Performed at [facility], [address], [place], [place] [address]    Report Status PENDING  Incomplete  Blood culture (routine x 2)     Status: None (Preliminary result)   Collection Time: 09/05/24  3:24 PM   Specimen: BLOOD LEFT HAND  Result Value Ref Range Status   Specimen Description   Final    BLOOD LEFT HAND Performed at [facility], [address], [place], [place] [address]    Special Requests   Final    BOTTLES DRAWN AEROBIC ONLY Blood Culture adequate volume Performed at [facility], [address] [place], [place] [address]    Culture   Final    NO GROWTH < 24 HOURS Performed at [facility], [address], [place], [place] [address]  Report Status PENDING  Incomplete         Radiology Studies: DG Chest Port 1 View Result Date: 09/05/2024 CLINICAL DATA:  Generalized pain. EXAM: PORTABLE CHEST 1 VIEW COMPARISON:  August 14, 2024 FINDINGS: The heart size and mediastinal contours are within normal limits. Both lungs are clear. The visualized skeletal structures are unremarkable. IMPRESSION: No active disease. Electronically Signed   By: Suzen Dials M.D.   On: 09/05/2024 16:53        Scheduled Meds:  enoxaparin  (LOVENOX ) injection  40 mg Subcutaneous QHS   folic acid   1 mg Oral Daily   multivitamin with minerals  1 tablet Oral Daily   pantoprazole   40 mg Oral BID   sodium chloride  flush  3 mL Intravenous Q12H   thiamine   100 mg Oral Daily   Continuous Infusions:  DAPTOmycin  Stopped (09/05/24 2123)   lactated ringers  125 mL/hr at 09/06/24 9192          Sophie Mao, MD [facility] 09/06/2024, 11:35 AM    "

## 2024-09-06 NOTE — ED Notes (Signed)
 Back from b/r. No changes. Alert, NAD, calm, interactive. Admitting team into see, at [facility].

## 2024-09-06 NOTE — Consult Note (Addendum)
 "       Date of Admission:  09/05/2024          Reason for Consult: Patient with prior MRSA bacteremia    Referring Provider: Cheryle Page, MD   Assessment:  History of MRSA bacteremia in the past with the patient having left AGAINST MEDICAL ADVICE on multiple occasions though he received oritavancin during his last hospital stay Hypothermia Lactic acidosis Homelessness History of bilateral below the knee amputations in need of well-fitting prostheses Wheelchair-bound PAF History of cocaine abuse Cirrhosis  Plan:  Continue daptomycin  Follow-up blood culture data He really needs help with housing and multiple other things to prevent him continuing to suffer from living on the streets in his condition. Supportive care He should be on contact precautions for MRSA   I do NOT think it is likely that he is currently actively bacteremic.  I would however continue on daptomycin  while the cultures are incubating.  Unless there is some compelling reason to give him a long-acting antibiotic or oral antibiotic if he decides to leave again tomorrow I would have him leave but without antibiotics  I will sign off for now please call back with further questions Dr. Luiz is available this weekend     HPI: Cory Palmer is a 64 year old man with history of diabetes mellitus atrial fibrillation hypertension homelessness cocaine abuse cirrhosis who is wheelchair-bound due to having needed a below the knee amputation bilaterally subdural hematomas who had been in and out of the hospital in January for MRSA bacteremia but kept leaving AGAINST MEDICAL ADVICE.  On the last time I saw him in mid January we gave him a dose of oritavancin given our concern that he was yet again going to leave AGAINST MEDICAL ADVICE.  Indeed he did eventually leave AGAINST MEDICAL ADVICE.  Fortunately his blood cultures had cleared by the 16th which was 3 days before he left.  He now returns to the hospital after  being stuck in a snow bed in his wheelchair.  He was there for hours.  He was suffering of extreme cold in the ER he was found to have a lactic acidosis and blood cultures were taken.  I was paged by the ER provider and recommended that he be admitted and started on daptomycin  while we wait for cultures to incubate   He feels much better since he has been in the hospital and has been out of the cold and received fluids.  He did endorse some difficulty breathing though chest x-ray was without any acute pathology.  I am not terribly suspicious that he still has an active MRSA bloodstream infection but while he is here it is reasonable to cover him with daptomycin .  He told me that he might stay for 1 additional day in the hospital.  He is very much wanting to get housing and also to get prostheses for his bilateral BKA's.       I personally spent a total of 80 minutes in the care of the patient today including preparing to see the patient, getting/reviewing separately obtained history, performing a medically appropriate exam/evaluation, counseling and educating, placing orders, referring and communicating with other health care professionals, documenting clinical information in the EHR, independently interpreting results, communicating results, and coordinating care.   Evaluation of the patient requires complex antimicrobial therapy evaluation, counseling , isolation needs to reduce disease transmission and risk assessment and mitigation.     Review of Systems: Review of Systems  Constitutional:  Positive  for chills and malaise/fatigue. Negative for fever and weight loss.  HENT:  Negative for congestion and sore throat.   Eyes:  Negative for blurred vision and photophobia.  Respiratory:  Negative for cough, shortness of breath and wheezing.   Cardiovascular:  Negative for chest pain, palpitations and leg swelling.  Gastrointestinal:  Negative for abdominal pain, blood in stool,  constipation, diarrhea, heartburn, melena, nausea and vomiting.  Genitourinary:  Negative for dysuria, flank pain and hematuria.  Musculoskeletal:  Positive for myalgias. Negative for back pain, falls and joint pain.  Skin:  Negative for itching and rash.  Neurological:  Negative for dizziness, focal weakness, loss of consciousness, weakness and headaches.  Endo/Heme/Allergies:  Does not bruise/bleed easily.  Psychiatric/Behavioral:  Negative for depression and suicidal ideas. The patient does not have insomnia.     Past Medical History:  Diagnosis Date   Acute hepatic encephalopathy (HCC) 11/17/2021   Anxiety    Atrial flutter with rapid ventricular response (HCC) 10/21/2019   Depressed bipolar disorder (HCC)    Diabetes mellitus without complication (HCC)    Hypertension    Liver cirrhosis (HCC)    Osteomyelitis of left foot (HCC) 10/19/2019   Osteomyelitis of toe of left foot (HCC)    PAF (paroxysmal atrial fibrillation) (HCC)    S/P transmetatarsal amputation of foot, 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   Severe sepsis (HCC) 03/24/2022   Stroke (HCC)    Upper GI bleed 07/08/2024    Social History[1]  Family History  Problem Relation Age of Onset   Hypertension Other    Diabetes Mellitus II Other    Allergies[2]  OBJECTIVE: Blood pressure 112/64, pulse 86, temperature (!) 97.5 F (36.4 C), temperature source Oral, resp. rate 20, weight 110 kg, SpO2 96%.  Physical Exam Constitutional:      Appearance: He is well-developed.  HENT:     Head: Normocephalic and atraumatic.  Eyes:     Conjunctiva/sclera: Conjunctivae normal.  Cardiovascular:     Rate and Rhythm: Normal rate and regular rhythm.  Pulmonary:     Effort: Pulmonary effort is normal. No respiratory distress.     Breath sounds: No wheezing.  Abdominal:     General: There is no distension.     Palpations: Abdomen is soft.  Musculoskeletal:     Cervical back: Normal range of motion and neck supple.  Skin:    General:  Skin is warm and dry.     Coloration: Skin is not pale.     Findings: No erythema or rash.  Neurological:     General: No focal deficit present.     Mental Status: He is alert and oriented to person, place, and time.  Psychiatric:        Mood and Affect: Mood normal.        Behavior: Behavior normal.        Thought Content: Thought content normal.        Judgment: Judgment normal.    BKA site on the left    BKA on the right well healed  Lab Results Lab Results  Component Value Date   WBC 6.2 09/06/2024   HGB 11.3 (L) 09/06/2024   HCT 33.3 (L) 09/06/2024   MCV 94.9 09/06/2024   PLT 134 (L) 09/06/2024    Lab Results  Component Value Date   CREATININE 0.74 09/06/2024   BUN 12 09/06/2024   NA 137 09/06/2024   K 3.6 09/06/2024   CL 103 09/06/2024   CO2 23 09/06/2024  Lab Results  Component Value Date   ALT 12 09/05/2024   AST 26 09/05/2024   ALKPHOS 135 (H) 09/05/2024   BILITOT 1.6 (H) 09/05/2024     Microbiology: Recent Results (from the past 240 hours)  Blood culture (routine x 2)     Status: None (Preliminary result)   Collection Time: 09/05/24  3:00 PM   Specimen: BLOOD RIGHT HAND  Result Value Ref Range Status   Specimen Description   Final    BLOOD RIGHT HAND Performed at [facility], [address], [place], [place] [address]    Special Requests   Final    BOTTLES DRAWN AEROBIC ONLY Blood Culture results may not be optimal due to an inadequate volume of blood received in culture bottles Performed at [facility], [address], [place], [place] [address]    Culture   Final    NO GROWTH < 24 HOURS Performed at [facility], [address], [place], [place] [address]    Report Status PENDING  Incomplete  Blood culture (routine x 2)     Status: None (Preliminary result)   Collection Time: 09/05/24  3:24 PM   Specimen: BLOOD LEFT HAND  Result Value Ref Range Status   Specimen Description   Final    BLOOD LEFT  HAND Performed at [facility], [address], [place], [place] [address]    Special Requests   Final    BOTTLES DRAWN AEROBIC ONLY Blood Culture adequate volume Performed at [facility], [address], [place], [place] [address]    Culture   Final    NO GROWTH < 24 HOURS Performed at [facility], [address], [place], [place] [address]    Report Status PENDING  Incomplete    Jomarie Fleeta Rothman, MD [facility] [phone] pager  09/06/2024, 12:30 PM      [1]  Social History Tobacco Use   Smoking status: Every Day    Current packs/day: 0.25    Types: Cigarettes    Passive exposure: Current   Smokeless tobacco: Never  Vaping Use   Vaping status: Never Used  Substance Use Topics   Alcohol use: Not Currently   Drug use: Not Currently    Types: Cocaine    Comment: Last used in July 2023  [2]  Allergies Allergen Reactions   Atorvastatin Calcium  Nausea And Vomiting   Gemfibrozil Other (See Comments)    Pt cannot recall symptoms   "

## 2024-09-06 NOTE — ED Notes (Signed)
 Pt restless, rocking back and forth, denies pain, states, just always have to be moving. Movement effects cardiac rhythm and oxisensor reading on monitor. VSS.
